# Patient Record
Sex: Female | Born: 1970
Health system: Southern US, Community
[De-identification: ages and names within clinical notes are randomized; demographics above are authoritative.]

## PROBLEM LIST (undated history)

## (undated) VITALS — BP 125/81 | HR 88 | Temp 98.7°F | Resp 17

## (undated) VITALS — BP 135/92 | HR 84 | Temp 98.2°F | Resp 18 | Ht 66.0 in | Wt 260.0 lb

## (undated) VITALS — BP 130/84 | HR 109 | Temp 98.3°F | Resp 16 | Ht 66.0 in | Wt 231.0 lb

## (undated) DIAGNOSIS — R7303 Prediabetes: Secondary | ICD-10-CM

## (undated) DIAGNOSIS — J45909 Unspecified asthma, uncomplicated: Secondary | ICD-10-CM

## (undated) DIAGNOSIS — R569 Unspecified convulsions: Secondary | ICD-10-CM

## (undated) DIAGNOSIS — I10 Essential (primary) hypertension: Secondary | ICD-10-CM

## (undated) DIAGNOSIS — E119 Type 2 diabetes mellitus without complications: Secondary | ICD-10-CM

## (undated) DIAGNOSIS — F32A Depression, unspecified: Secondary | ICD-10-CM

## (undated) DIAGNOSIS — F209 Schizophrenia, unspecified: Secondary | ICD-10-CM

## (undated) DIAGNOSIS — F419 Anxiety disorder, unspecified: Secondary | ICD-10-CM

## (undated) DIAGNOSIS — F329 Major depressive disorder, single episode, unspecified: Secondary | ICD-10-CM

## (undated) DIAGNOSIS — G4700 Insomnia, unspecified: Secondary | ICD-10-CM

## (undated) DIAGNOSIS — C50919 Malignant neoplasm of unspecified site of unspecified female breast: Secondary | ICD-10-CM

## (undated) DIAGNOSIS — D649 Anemia, unspecified: Secondary | ICD-10-CM

## (undated) DIAGNOSIS — F319 Bipolar disorder, unspecified: Secondary | ICD-10-CM

## (undated) HISTORY — PX: BREAST SURGERY: SHX581

## (undated) HISTORY — PX: TONSILLECTOMY: SUR1361

## (undated) HISTORY — DX: Prediabetes: R73.03

## (undated) HISTORY — DX: Malignant neoplasm of unspecified site of unspecified female breast: C50.919

---

## 1998-03-02 ENCOUNTER — Emergency Department (HOSPITAL_COMMUNITY): Admission: EM | Admit: 1998-03-02 | Discharge: 1998-03-02 | Payer: Self-pay | Admitting: Emergency Medicine

## 1998-04-10 ENCOUNTER — Emergency Department (HOSPITAL_COMMUNITY): Admission: EM | Admit: 1998-04-10 | Discharge: 1998-04-10 | Payer: Self-pay | Admitting: Emergency Medicine

## 1998-07-11 ENCOUNTER — Inpatient Hospital Stay (HOSPITAL_COMMUNITY): Admission: AD | Admit: 1998-07-11 | Discharge: 1998-07-16 | Payer: Self-pay | Admitting: *Deleted

## 1998-09-29 ENCOUNTER — Inpatient Hospital Stay (HOSPITAL_COMMUNITY): Admission: AD | Admit: 1998-09-29 | Discharge: 1998-09-29 | Payer: Self-pay | Admitting: Obstetrics

## 1998-10-17 ENCOUNTER — Emergency Department (HOSPITAL_COMMUNITY): Admission: EM | Admit: 1998-10-17 | Discharge: 1998-10-17 | Payer: Self-pay | Admitting: Emergency Medicine

## 1998-12-08 ENCOUNTER — Other Ambulatory Visit: Admission: RE | Admit: 1998-12-08 | Discharge: 1998-12-08 | Payer: Self-pay | Admitting: Obstetrics

## 1999-01-30 ENCOUNTER — Inpatient Hospital Stay (HOSPITAL_COMMUNITY): Admission: EM | Admit: 1999-01-30 | Discharge: 1999-02-01 | Payer: Self-pay

## 1999-03-25 ENCOUNTER — Inpatient Hospital Stay (HOSPITAL_COMMUNITY): Admission: AD | Admit: 1999-03-25 | Discharge: 1999-03-25 | Payer: Self-pay | Admitting: Obstetrics

## 1999-04-02 ENCOUNTER — Inpatient Hospital Stay (HOSPITAL_COMMUNITY): Admission: EM | Admit: 1999-04-02 | Discharge: 1999-04-09 | Payer: Self-pay | Admitting: Emergency Medicine

## 1999-04-24 ENCOUNTER — Inpatient Hospital Stay (HOSPITAL_COMMUNITY): Admission: AD | Admit: 1999-04-24 | Discharge: 1999-04-24 | Payer: Self-pay | Admitting: Obstetrics

## 1999-04-25 ENCOUNTER — Encounter (INDEPENDENT_AMBULATORY_CARE_PROVIDER_SITE_OTHER): Payer: Self-pay | Admitting: Specialist

## 1999-04-25 ENCOUNTER — Inpatient Hospital Stay (HOSPITAL_COMMUNITY): Admission: AD | Admit: 1999-04-25 | Discharge: 1999-04-27 | Payer: Self-pay | Admitting: Obstetrics

## 2000-02-08 ENCOUNTER — Emergency Department (HOSPITAL_COMMUNITY): Admission: EM | Admit: 2000-02-08 | Discharge: 2000-02-09 | Payer: Self-pay | Admitting: Emergency Medicine

## 2000-08-28 HISTORY — PX: TUBAL LIGATION: SHX77

## 2000-10-12 ENCOUNTER — Inpatient Hospital Stay (HOSPITAL_COMMUNITY): Admission: EM | Admit: 2000-10-12 | Discharge: 2000-10-17 | Payer: Self-pay | Admitting: *Deleted

## 2000-12-24 ENCOUNTER — Other Ambulatory Visit (HOSPITAL_COMMUNITY): Admission: RE | Admit: 2000-12-24 | Discharge: 2000-12-28 | Payer: Self-pay | Admitting: Psychiatry

## 2001-01-20 ENCOUNTER — Inpatient Hospital Stay (HOSPITAL_COMMUNITY): Admission: AD | Admit: 2001-01-20 | Discharge: 2001-01-21 | Payer: Self-pay | Admitting: Obstetrics

## 2001-01-21 ENCOUNTER — Encounter: Payer: Self-pay | Admitting: Obstetrics

## 2001-01-23 ENCOUNTER — Encounter: Admission: RE | Admit: 2001-01-23 | Discharge: 2001-01-23 | Payer: Self-pay | Admitting: Obstetrics & Gynecology

## 2001-05-09 ENCOUNTER — Inpatient Hospital Stay (HOSPITAL_COMMUNITY): Admission: EM | Admit: 2001-05-09 | Discharge: 2001-05-17 | Payer: Self-pay | Admitting: *Deleted

## 2001-05-17 ENCOUNTER — Encounter: Payer: Self-pay | Admitting: Obstetrics

## 2001-05-17 ENCOUNTER — Inpatient Hospital Stay (HOSPITAL_COMMUNITY): Admission: AD | Admit: 2001-05-17 | Discharge: 2001-05-19 | Payer: Self-pay | Admitting: Obstetrics

## 2001-05-17 ENCOUNTER — Encounter (INDEPENDENT_AMBULATORY_CARE_PROVIDER_SITE_OTHER): Payer: Self-pay | Admitting: Specialist

## 2001-05-19 ENCOUNTER — Inpatient Hospital Stay (HOSPITAL_COMMUNITY): Admission: AD | Admit: 2001-05-19 | Discharge: 2001-05-27 | Payer: Self-pay | Admitting: *Deleted

## 2001-09-21 ENCOUNTER — Inpatient Hospital Stay (HOSPITAL_COMMUNITY): Admission: EM | Admit: 2001-09-21 | Discharge: 2001-10-01 | Payer: Self-pay | Admitting: Psychiatry

## 2001-10-02 ENCOUNTER — Other Ambulatory Visit (HOSPITAL_COMMUNITY): Admission: RE | Admit: 2001-10-02 | Discharge: 2001-10-07 | Payer: Self-pay | Admitting: *Deleted

## 2001-10-07 ENCOUNTER — Inpatient Hospital Stay (HOSPITAL_COMMUNITY): Admission: AD | Admit: 2001-10-07 | Discharge: 2001-10-07 | Payer: Self-pay | Admitting: Obstetrics

## 2001-11-18 ENCOUNTER — Inpatient Hospital Stay (HOSPITAL_COMMUNITY): Admission: EM | Admit: 2001-11-18 | Discharge: 2001-12-04 | Payer: Self-pay | Admitting: Psychiatry

## 2001-12-05 ENCOUNTER — Other Ambulatory Visit (HOSPITAL_COMMUNITY): Admission: RE | Admit: 2001-12-05 | Discharge: 2001-12-16 | Payer: Self-pay | Admitting: *Deleted

## 2003-05-14 ENCOUNTER — Inpatient Hospital Stay (HOSPITAL_COMMUNITY): Admission: EM | Admit: 2003-05-14 | Discharge: 2003-05-21 | Payer: Self-pay | Admitting: Psychiatry

## 2004-01-31 ENCOUNTER — Inpatient Hospital Stay (HOSPITAL_COMMUNITY): Admission: EM | Admit: 2004-01-31 | Discharge: 2004-02-06 | Payer: Self-pay | Admitting: Psychiatry

## 2004-06-16 ENCOUNTER — Ambulatory Visit: Payer: Self-pay | Admitting: Psychiatry

## 2004-06-16 ENCOUNTER — Inpatient Hospital Stay (HOSPITAL_COMMUNITY): Admission: RE | Admit: 2004-06-16 | Discharge: 2004-06-21 | Payer: Self-pay | Admitting: Psychiatry

## 2004-11-17 ENCOUNTER — Inpatient Hospital Stay (HOSPITAL_COMMUNITY): Admission: RE | Admit: 2004-11-17 | Discharge: 2004-11-21 | Payer: Self-pay | Admitting: Psychiatry

## 2004-11-17 ENCOUNTER — Ambulatory Visit: Payer: Self-pay | Admitting: Psychiatry

## 2006-02-07 ENCOUNTER — Inpatient Hospital Stay (HOSPITAL_COMMUNITY): Admission: RE | Admit: 2006-02-07 | Discharge: 2006-02-14 | Payer: Self-pay | Admitting: *Deleted

## 2006-02-08 ENCOUNTER — Ambulatory Visit: Payer: Self-pay | Admitting: *Deleted

## 2008-10-11 ENCOUNTER — Other Ambulatory Visit: Payer: Self-pay | Admitting: Emergency Medicine

## 2008-10-11 ENCOUNTER — Inpatient Hospital Stay (HOSPITAL_COMMUNITY): Admission: EM | Admit: 2008-10-11 | Discharge: 2008-10-16 | Payer: Self-pay | Admitting: Psychiatry

## 2008-10-11 ENCOUNTER — Ambulatory Visit: Payer: Self-pay | Admitting: Psychiatry

## 2009-02-06 ENCOUNTER — Other Ambulatory Visit: Payer: Self-pay | Admitting: Emergency Medicine

## 2009-02-07 ENCOUNTER — Ambulatory Visit: Payer: Self-pay | Admitting: *Deleted

## 2009-02-07 ENCOUNTER — Inpatient Hospital Stay (HOSPITAL_COMMUNITY): Admission: AD | Admit: 2009-02-07 | Discharge: 2009-02-12 | Payer: Self-pay | Admitting: *Deleted

## 2009-03-28 ENCOUNTER — Inpatient Hospital Stay (HOSPITAL_COMMUNITY): Admission: AD | Admit: 2009-03-28 | Discharge: 2009-04-01 | Payer: Self-pay | Admitting: Psychiatry

## 2009-03-28 ENCOUNTER — Ambulatory Visit: Payer: Self-pay | Admitting: Psychiatry

## 2009-05-08 ENCOUNTER — Inpatient Hospital Stay (HOSPITAL_COMMUNITY): Admission: AD | Admit: 2009-05-08 | Discharge: 2009-05-19 | Payer: Self-pay | Admitting: Psychiatry

## 2009-05-08 ENCOUNTER — Ambulatory Visit: Payer: Self-pay | Admitting: Psychiatry

## 2009-07-27 ENCOUNTER — Emergency Department (HOSPITAL_COMMUNITY): Admission: EM | Admit: 2009-07-27 | Discharge: 2009-07-27 | Payer: Self-pay | Admitting: Emergency Medicine

## 2010-09-03 ENCOUNTER — Emergency Department (HOSPITAL_COMMUNITY)
Admission: EM | Admit: 2010-09-03 | Discharge: 2010-09-03 | Disposition: A | Discharge status: Other Acute Inpatient Hospital | Payer: Self-pay | Source: Home / Self Care | Admitting: Emergency Medicine

## 2010-09-03 ENCOUNTER — Inpatient Hospital Stay (HOSPITAL_COMMUNITY)
Admission: RE | Admit: 2010-09-03 | Discharge: 2010-09-09 | Payer: Self-pay | Source: Home / Self Care | Attending: Psychiatry | Admitting: Psychiatry

## 2010-09-12 LAB — URINE MICROSCOPIC-ADD ON

## 2010-09-12 LAB — RAPID URINE DRUG SCREEN, HOSP PERFORMED
Amphetamines: NOT DETECTED
Barbiturates: NOT DETECTED
Benzodiazepines: NOT DETECTED
Cocaine: NOT DETECTED
Opiates: NOT DETECTED
Tetrahydrocannabinol: NOT DETECTED

## 2010-09-12 LAB — COMPREHENSIVE METABOLIC PANEL
ALT: 18 U/L (ref 0–35)
AST: 21 U/L (ref 0–37)
Albumin: 4 g/dL (ref 3.5–5.2)
Alkaline Phosphatase: 50 U/L (ref 39–117)
BUN: 8 mg/dL (ref 6–23)
CO2: 24 mEq/L (ref 19–32)
Calcium: 9.8 mg/dL (ref 8.4–10.5)
Chloride: 107 mEq/L (ref 96–112)
Creatinine, Ser: 0.86 mg/dL (ref 0.4–1.2)
GFR calc Af Amer: >60 mL/min (ref >60)
GFR calc non Af Amer: >60 mL/min (ref >60)
Glucose, Bld: 87 mg/dL (ref 70–99)
Potassium: 3.3 mEq/L — ABNORMAL LOW (ref 3.5–5.1)
Sodium: 141 mEq/L (ref 135–145)
Total Bilirubin: 0.7 mg/dL (ref 0.3–1.2)
Total Protein: 7.3 g/dL (ref 6.0–8.3)

## 2010-09-12 LAB — DIFFERENTIAL
Basophils Absolute: 0 10*3/uL (ref 0.0–0.1)
Basophils Relative: 1 % (ref 0–1)
Eosinophils Absolute: 0.5 10*3/uL (ref 0.0–0.7)
Eosinophils Relative: 7 % — ABNORMAL HIGH (ref 0–5)
Lymphocytes Relative: 33 % (ref 12–46)
Lymphs Abs: 2.2 10*3/uL (ref 0.7–4.0)
Monocytes Absolute: 0.5 10*3/uL (ref 0.1–1.0)
Monocytes Relative: 7 % (ref 3–12)
Neutro Abs: 3.5 10*3/uL (ref 1.7–7.7)
Neutrophils Relative %: 52 % (ref 43–77)

## 2010-09-12 LAB — URINALYSIS, ROUTINE W REFLEX MICROSCOPIC
Hgb urine dipstick: NEGATIVE
Ketones, ur: >80 mg/dL — AB
Nitrite: NEGATIVE
Protein, ur: NEGATIVE mg/dL
Specific Gravity, Urine: 1.031 — ABNORMAL HIGH (ref 1.005–1.030)
Urine Glucose, Fasting: NEGATIVE mg/dL
Urobilinogen, UA: 1 mg/dL (ref 0.0–1.0)
pH: 6 (ref 5.0–8.0)

## 2010-09-12 LAB — CBC
HCT: 37.9 % (ref 36.0–46.0)
Hemoglobin: 12.7 g/dL (ref 12.0–15.0)
MCH: 30.7 pg (ref 26.0–34.0)
MCHC: 33.5 g/dL (ref 30.0–36.0)
MCV: 91.5 fL (ref 78.0–100.0)
Platelets: 225 10*3/uL (ref 150–400)
RBC: 4.14 MIL/uL (ref 3.87–5.11)
RDW: 13.1 % (ref 11.5–15.5)
WBC: 6.6 10*3/uL (ref 4.0–10.5)

## 2010-09-12 LAB — PREGNANCY, URINE: Preg Test, Ur: NEGATIVE

## 2010-09-12 LAB — ETHANOL: Alcohol, Ethyl (B): <5 mg/dL (ref 0–10)

## 2010-09-12 LAB — VALPROIC ACID LEVEL: Valproic Acid Lvl: <10 ug/mL — ABNORMAL LOW (ref 50.0–100.0)

## 2010-09-27 ENCOUNTER — Inpatient Hospital Stay (HOSPITAL_COMMUNITY)
Admission: AD | Admit: 2010-09-27 | Discharge: 2010-10-03 | DRG: 885 | Disposition: A | Discharge status: Home / Self Care | Payer: Medicare Other | Attending: Psychiatry | Admitting: Psychiatry

## 2010-09-27 DIAGNOSIS — F259 Schizoaffective disorder, unspecified: Principal | ICD-10-CM

## 2010-09-27 DIAGNOSIS — Z598 Other problems related to housing and economic circumstances: Secondary | ICD-10-CM

## 2010-09-27 LAB — URINALYSIS, ROUTINE W REFLEX MICROSCOPIC
Bilirubin Urine: NEGATIVE
Hgb urine dipstick: NEGATIVE
Ketones, ur: NEGATIVE mg/dL
Nitrite: NEGATIVE
Protein, ur: NEGATIVE mg/dL
Specific Gravity, Urine: 1.02 (ref 1.005–1.030)
Urine Glucose, Fasting: NEGATIVE mg/dL
Urobilinogen, UA: 1 mg/dL (ref 0.0–1.0)
pH: 7.5 (ref 5.0–8.0)

## 2010-09-28 DIAGNOSIS — F259 Schizoaffective disorder, unspecified: Secondary | ICD-10-CM

## 2010-09-28 LAB — HCG, QUANTITATIVE, PREGNANCY: hCG, Beta Chain, Quant, S: <2 m[IU]/mL (ref <5)

## 2010-09-29 LAB — HEMOGLOBIN A1C
Hgb A1c MFr Bld: 5.8 % — ABNORMAL HIGH (ref <5.7)
Mean Plasma Glucose: 120 mg/dL — ABNORMAL HIGH (ref <117)

## 2010-09-29 LAB — VALPROIC ACID LEVEL: Valproic Acid Lvl: 34 ug/mL — ABNORMAL LOW (ref 50.0–100.0)

## 2010-10-02 LAB — VALPROIC ACID LEVEL: Valproic Acid Lvl: 73.6 ug/mL (ref 50.0–100.0)

## 2010-10-04 NOTE — H&P (Signed)
NAMELOIS, SLAGEL                ACCOUNT NO.:  [id number]  MEDICAL RECORD NO.:  [id number]          PATIENT TYPE:  IPS  LOCATION:  0403                          FACILITY:  [facility]  PHYSICIAN:  Eulogio Ditch, MD DATE OF BIRTH:  Jan 16, 1971  DATE OF ADMISSION:  09/27/2010 DATE OF DISCHARGE:                      PSYCHIATRIC ADMISSION ASSESSMENT   IDENTIFICATION:  A 40 year old female, single.  This is a voluntary admission.  HISTORY OF PRESENT ILLNESS:  This is one of several [facility] admissions for Tandrea who arrived with her bags packed here at the [facility] today.  York Spaniel that she took the bus over here because she has been arguing with her mother with whom she has been staying.  She is unable to say why they were arguing, but said that her mother has gotten really irritable with her and has been yelling at her.  She does not feel that it is safe to return there, and she is getting very frustrated with the situation.  It is not clear if she has been compliant with her medications.  She reports that most recently she was hospitalized at [facility] in [place], [name], and was discharged on January 27, because she had been walking out doors without her clothes on.  She denies any homicidal or suicidal thoughts today, but feels she cannot return to her home.  PAST PSYCHIATRIC HISTORY:  Kareema has a history of schizoaffective disorder with several recent admissions, most recently here January 7- 13, 2012, and apparently at [facility] 2-3 days and discharged January 27.  She has a history of being followed recently by an ACT Team for outpatient treatment.  Recently, she was started on Risperdal Consta with her first injection on September 05, 2010.  She has a history of episodes of getting anxious, somewhat agitated and rather disorganized. No known history of suicide attempts.  SOCIAL HISTORY:  This is a single 40 year old female who has been separated for  many years now from her husband of 7 years.  She has 4 or 5 children that are currently in the care of her mother.  She completed high school and 2-3 years of college before developing mental illness. No history of legal problems or charges.  No history of substance abuse.  FAMILY HISTORY:  Not available.  MEDICAL HISTORY:  Primary care physician is not known.  CURRENT MEDICAL PROBLEMS:  None.  She denies.  CURRENT MEDICATIONS:  Medications were validated with her [place] records which have arrived. 1. Risperdal Consta injection on September 19, 2010, of 25 mg, and     previously on January 9 for 50 mg IM here at [facility]. 2. She has taken Depo-Provera injections in the past for     contraception, but current contraception is unclear. 3. Cogentin 1 mg q.h.s. 4. Depakote ER 1000 mg p.o. q.h.s. 5. Trazodone 100 mg q.h.s. 6. Fish oil 1 gram one cap b.i.d. 7. Risperidone 2 mg t.i.d. p.o.  REVIEW OF SYSTEMS:  GENERAL:  This is a well-nourished, well-developed, African American female in no acute physical distress, pleasant and cooperative, a little bit disheveled today.  She is unable to  participate meaningfully in her review of systems, but gives limited information.  CONSTITUTION:  Her sleep has been fine until the past couple days when she was been worrying about the arguments with her mother.  She denies any dizziness or weight loss.  She has been eating regularly. RESPIRATORY:  No shortness of breath.  No chest pain.  She is a nonsmoker.  CARDIAC:  No palpitations.  No syncope.  Denies chest pain. GENITOURINARY:  Denies recent sexual activity.  Denies dysuria or vaginal discharge. GI:  Bowels are regular without medications.  No changes in bowel function or character.  NEURO:  Denies any muscle stiffness or extrapyramidal side effects.  No muscle tension or rigidity.  Denies motor restlessness, dizziness or changes in vision.  ENDOCRINE:  Denies any intolerance to heat or  cold.  PHYSICAL EXAMINATION:  VITAL SIGNS:  Admitting vital signs temperature 97.2, pulse 76, respirations 17, blood pressure 118/80.  She is 93 kg 5 feet 5 inches tall. HEENT:  Normocephalic.  Hair and scalp in good condition. NECK:  Full, active mobility.  Neck is supple.  No thyromegaly.  No lymphadenopathy. CHEST:  Chest is clear to auscultation. CARDIAC:  S1-S2 is heard, regular pulse.  No extra sounds. ABDOMEN:  Soft, nontender, rounded. GENITALIA:  Deferred. BREAST:  Deferred. MUSCULOSKELETAL:  No muscular rigidity noted.  Strength is 5/5 throughout.  Gait is normal with normal arm swing. NEURO:  Cranial nerves II-XII are intact.  Balance intact.  Grip strength equal.  Some difficulty following directions for the cranial nerve exam.  Romberg without findings.  Ames score is zero.  Neuro is nonfocal.  MENTAL STATUS EXAM:  Admitting mental status exam of fully alert, cooperative female with good eye contact, slightly disheveled, cooperative, willing to tell her story.  Somewhat limited insight, but understands that she has not been getting along with her mother and feels hopeless about being able to resolve their conflict, although she is unable to be specific about why they were arguing.  Mood is neutral. At this time, no suicidal or homicidal thoughts.  Has been cooperative here with staff.  Thinking is relatively well organized.  Cognitively, she is intact, oriented x3.  Insight limited.  DISCHARGE DIAGNOSES:  AXIS I:  Schizoaffective disorder. AXIS II:  Deferred. AXIS III:  No diagnosis. AXIS IV:  Severe issues with independent living. AXIS V:  Current 40, past year not known.  PLAN:  Plan is to voluntarily admit her with a goal of evaluating her mental status, and we will contact her mother and attempt to hear additional details about what is going on.  We are going to continue her Depakote and Risperdal Consta.  At this point, we will check a valproate level and a  urine pregnancy test.     Young Berry. Lorin Picket, N.P.   ______________________________ Eulogio Ditch, MD    MAS/MEDQ  D:  09/28/2010  T:  09/28/2010  Job:  [id number]  Electronically Signed by Kari Baars N.P. on 09/29/2010 08:54:57 AM Electronically Signed by Eulogio Ditch  on 10/04/2010 10:05:06 AM

## 2010-10-07 NOTE — Discharge Summary (Signed)
  Yvette, [name]                ACCOUNT NO.:  [id number]  MEDICAL RECORD NO.:  [id number]           PATIENT TYPE:  I  LOCATION:  0403                          FACILITY:  [facility]  PHYSICIAN:  Anselm Jungling, MD  DATE OF BIRTH:  Jan 08, 1971  DATE OF ADMISSION:  09/27/2010 DATE OF DISCHARGE:  10/03/2010                              DISCHARGE SUMMARY   IDENTIFYING DATA/REASON FOR ADMISSION:  This was an inpatient psychiatric admission for [name], an [place] American female with a history of schizoaffective disorder.  Please refer to the admission note for further details pertaining to the symptoms, circumstances and history that led to her hospitalization.  She was given an initial Axis I diagnosis of schizoaffective disorder, NOS.  MEDICAL AND LABORATORY:  The patient was medically and physically assessed by the psychiatric nurse practitioner.  She was in good health without any active or chronic medical problems.  There were nosignificant medical issues during her stay.  HOSPITAL COURSE:  The patient was admitted to the adult inpatient psychiatric service.  She presented as a well-nourished, normally- developed adult female, who had prominent symptoms of thought disorder, with blunted affect.  She had been having auditory hallucinations, and delusional ideation, within a context of thoughts and impulses to harm others.  She was stabilized with a regimen of risperidone, and Depakote, with Cogentin for side effects.  She participated in therapeutic groups and activities geared towards helping her acquire better coping skills, a better understanding of her underlying disorders and dynamics, and the development of an aftercare plan.  She was a fairly good participant throughout.  She worked closely with case management towards an aftercare plan that included appropriate housing that would be supportive to her needs.  The patient appeared appropriate for discharge on the seventh  hospital day.  She agreed to the following aftercare plan.  AFTERCARE:  The patient was to be discharged, with a plan to live at the [facility].  DISCHARGE MEDICATIONS: 1. Risperdal 1 mg q.a.m. and 2 mg nightly. 2. Risperdal Consta 50 mg IM q.4 weeks, last given on October 03, 2010, the day of discharge. 3. Cogentin 1 mg nightly. 4. Depakote 500 mg b.i.d.  DISCHARGE DIAGNOSES:  Axis I: Schizoaffective disorder, not otherwise specified. Axis II: Deferred. Axis III: No acute or chronic illnesses. Axis IV: Stressors severe. Axis V: Global Assessment of Functioning on discharge 50.     Anselm Jungling, MD     SPB/MEDQ  D:  10/06/2010  T:  10/06/2010  Job:  [id number]  Electronically Signed by Geralyn Flash MD on 10/07/2010 08:53:24 AM

## 2010-11-02 ENCOUNTER — Ambulatory Visit (HOSPITAL_COMMUNITY)
Admission: RE | Admit: 2010-11-02 | Discharge: 2010-11-02 | Disposition: A | Discharge status: Home / Self Care | Payer: Medicare Other | Attending: Psychiatry | Admitting: Psychiatry

## 2010-11-07 ENCOUNTER — Other Ambulatory Visit (HOSPITAL_COMMUNITY): Payer: Medicare Other | Attending: Psychiatry | Admitting: Psychiatry

## 2010-11-07 DIAGNOSIS — F259 Schizoaffective disorder, unspecified: Secondary | ICD-10-CM | POA: Insufficient documentation

## 2010-11-08 ENCOUNTER — Other Ambulatory Visit (HOSPITAL_COMMUNITY): Payer: Medicare Other | Attending: Psychiatry | Admitting: Psychiatry

## 2010-11-08 DIAGNOSIS — F3112 Bipolar disorder, current episode manic without psychotic features, moderate: Secondary | ICD-10-CM | POA: Insufficient documentation

## 2010-11-08 DIAGNOSIS — J45909 Unspecified asthma, uncomplicated: Secondary | ICD-10-CM | POA: Insufficient documentation

## 2010-11-08 DIAGNOSIS — F259 Schizoaffective disorder, unspecified: Secondary | ICD-10-CM

## 2010-11-08 DIAGNOSIS — J309 Allergic rhinitis, unspecified: Secondary | ICD-10-CM | POA: Insufficient documentation

## 2010-11-09 ENCOUNTER — Other Ambulatory Visit (HOSPITAL_COMMUNITY): Payer: Medicare Other | Admitting: Psychiatry

## 2010-11-10 ENCOUNTER — Other Ambulatory Visit (HOSPITAL_COMMUNITY): Payer: Medicare Other | Admitting: Psychiatry

## 2010-11-11 ENCOUNTER — Other Ambulatory Visit (HOSPITAL_COMMUNITY): Payer: Medicare Other | Admitting: Psychiatry

## 2010-11-14 ENCOUNTER — Other Ambulatory Visit (HOSPITAL_COMMUNITY): Payer: Medicare Other | Admitting: Psychiatry

## 2010-11-15 ENCOUNTER — Other Ambulatory Visit (HOSPITAL_COMMUNITY): Payer: Medicare Other | Admitting: Psychiatry

## 2010-11-16 ENCOUNTER — Other Ambulatory Visit (HOSPITAL_COMMUNITY): Payer: Medicare Other | Admitting: Psychiatry

## 2010-11-17 ENCOUNTER — Other Ambulatory Visit (HOSPITAL_COMMUNITY): Payer: Medicare Other | Admitting: Psychiatry

## 2010-11-18 ENCOUNTER — Other Ambulatory Visit (HOSPITAL_COMMUNITY): Payer: Medicare Other | Admitting: Psychiatry

## 2010-11-21 ENCOUNTER — Other Ambulatory Visit (HOSPITAL_COMMUNITY): Payer: Medicare Other | Admitting: Psychiatry

## 2010-11-22 ENCOUNTER — Other Ambulatory Visit (HOSPITAL_COMMUNITY): Payer: Medicare Other | Admitting: Psychiatry

## 2010-11-23 ENCOUNTER — Other Ambulatory Visit (HOSPITAL_COMMUNITY): Payer: Medicare Other | Admitting: Psychiatry

## 2010-11-24 ENCOUNTER — Other Ambulatory Visit (HOSPITAL_COMMUNITY): Payer: Medicare Other | Admitting: Psychiatry

## 2010-11-25 ENCOUNTER — Other Ambulatory Visit (HOSPITAL_COMMUNITY): Payer: Medicare Other | Admitting: Psychiatry

## 2010-11-28 ENCOUNTER — Other Ambulatory Visit (HOSPITAL_COMMUNITY): Payer: Medicare Other | Admitting: Psychiatry

## 2010-12-02 LAB — VALPROIC ACID LEVEL
Valproic Acid Lvl: 96.6 ug/mL (ref 50.0–100.0)
Valproic Acid Lvl: 9 ug/mL — ABNORMAL LOW (ref 50.0–100.0)

## 2010-12-02 LAB — CBC
HCT: 36.3 % (ref 36.0–46.0)
Hemoglobin: 12.4 g/dL (ref 12.0–15.0)
MCHC: 34.2 g/dL (ref 30.0–36.0)
MCV: 92.8 fL (ref 78.0–100.0)
Platelets: 207 10*3/uL (ref 150–400)
RBC: 3.92 MIL/uL (ref 3.87–5.11)
RDW: 13.1 % (ref 11.5–15.5)
WBC: 5.7 10*3/uL (ref 4.0–10.5)

## 2010-12-02 LAB — COMPREHENSIVE METABOLIC PANEL
ALT: 12 U/L (ref 0–35)
AST: 16 U/L (ref 0–37)
Albumin: 4.1 g/dL (ref 3.5–5.2)
Alkaline Phosphatase: 53 U/L (ref 39–117)
BUN: 8 mg/dL (ref 6–23)
CO2: 30 mEq/L (ref 19–32)
Calcium: 9.7 mg/dL (ref 8.4–10.5)
Chloride: 104 mEq/L (ref 96–112)
Creatinine, Ser: 0.89 mg/dL (ref 0.4–1.2)
GFR calc Af Amer: >60 mL/min (ref >60)
GFR calc non Af Amer: >60 mL/min (ref >60)
Glucose, Bld: 72 mg/dL (ref 70–99)
Potassium: 3.5 mEq/L (ref 3.5–5.1)
Sodium: 139 mEq/L (ref 135–145)
Total Bilirubin: 0.6 mg/dL (ref 0.3–1.2)
Total Protein: 7.5 g/dL (ref 6.0–8.3)

## 2010-12-03 LAB — COMPREHENSIVE METABOLIC PANEL
ALT: 12 U/L (ref 0–35)
AST: 15 U/L (ref 0–37)
Albumin: 3.9 g/dL (ref 3.5–5.2)
Alkaline Phosphatase: 48 U/L (ref 39–117)
BUN: 12 mg/dL (ref 6–23)
CO2: 27 mEq/L (ref 19–32)
Calcium: 9.5 mg/dL (ref 8.4–10.5)
Chloride: 108 mEq/L (ref 96–112)
Creatinine, Ser: 1.13 mg/dL (ref 0.4–1.2)
GFR calc Af Amer: >60 mL/min (ref >60)
GFR calc non Af Amer: 54 mL/min — ABNORMAL LOW (ref >60)
Glucose, Bld: 96 mg/dL (ref 70–99)
Potassium: 3.6 mEq/L (ref 3.5–5.1)
Sodium: 144 mEq/L (ref 135–145)
Total Bilirubin: 0.4 mg/dL (ref 0.3–1.2)
Total Protein: 7.2 g/dL (ref 6.0–8.3)

## 2010-12-03 LAB — TSH: TSH: 0.915 u[IU]/mL (ref 0.350–4.500)

## 2010-12-03 LAB — CBC
HCT: 38.4 % (ref 36.0–46.0)
Hemoglobin: 13 g/dL (ref 12.0–15.0)
MCHC: 33.9 g/dL (ref 30.0–36.0)
MCV: 92.6 fL (ref 78.0–100.0)
Platelets: 199 10*3/uL (ref 150–400)
RBC: 4.15 MIL/uL (ref 3.87–5.11)
RDW: 14 % (ref 11.5–15.5)
WBC: 5.8 10*3/uL (ref 4.0–10.5)

## 2010-12-03 LAB — VALPROIC ACID LEVEL: Valproic Acid Lvl: <10 ug/mL — ABNORMAL LOW (ref 50.0–100.0)

## 2010-12-05 LAB — COMPREHENSIVE METABOLIC PANEL
ALT: 17 U/L (ref 0–35)
AST: 17 U/L (ref 0–37)
Albumin: 3.8 g/dL (ref 3.5–5.2)
Alkaline Phosphatase: 47 U/L (ref 39–117)
BUN: 7 mg/dL (ref 6–23)
CO2: 28 mEq/L (ref 19–32)
Calcium: 9.3 mg/dL (ref 8.4–10.5)
Chloride: 104 mEq/L (ref 96–112)
Creatinine, Ser: 0.7 mg/dL (ref 0.4–1.2)
GFR calc Af Amer: >60 mL/min (ref >60)
GFR calc non Af Amer: >60 mL/min (ref >60)
Glucose, Bld: 90 mg/dL (ref 70–99)
Potassium: 4 mEq/L (ref 3.5–5.1)
Sodium: 140 mEq/L (ref 135–145)
Total Bilirubin: 0.5 mg/dL (ref 0.3–1.2)
Total Protein: 7.1 g/dL (ref 6.0–8.3)

## 2010-12-05 LAB — CBC
HCT: 35 % — ABNORMAL LOW (ref 36.0–46.0)
Hemoglobin: 11.6 g/dL — ABNORMAL LOW (ref 12.0–15.0)
MCHC: 33.3 g/dL (ref 30.0–36.0)
MCV: 93.1 fL (ref 78.0–100.0)
Platelets: 192 10*3/uL (ref 150–400)
RBC: 3.76 MIL/uL — ABNORMAL LOW (ref 3.87–5.11)
RDW: 13.2 % (ref 11.5–15.5)
WBC: 5.1 10*3/uL (ref 4.0–10.5)

## 2010-12-05 LAB — DIFFERENTIAL
Basophils Absolute: 0.1 10*3/uL (ref 0.0–0.1)
Basophils Relative: 1 % (ref 0–1)
Eosinophils Absolute: 0.3 10*3/uL (ref 0.0–0.7)
Eosinophils Relative: 5 % (ref 0–5)
Lymphocytes Relative: 35 % (ref 12–46)
Lymphs Abs: 1.8 10*3/uL (ref 0.7–4.0)
Monocytes Absolute: 0.3 10*3/uL (ref 0.1–1.0)
Monocytes Relative: 6 % (ref 3–12)
Neutro Abs: 2.6 10*3/uL (ref 1.7–7.7)
Neutrophils Relative %: 52 % (ref 43–77)

## 2010-12-05 LAB — PREGNANCY, URINE: Preg Test, Ur: NEGATIVE

## 2010-12-05 LAB — URINALYSIS, ROUTINE W REFLEX MICROSCOPIC
Bilirubin Urine: NEGATIVE
Glucose, UA: NEGATIVE mg/dL
Hgb urine dipstick: NEGATIVE
Ketones, ur: NEGATIVE mg/dL
Nitrite: NEGATIVE
Protein, ur: NEGATIVE mg/dL
Specific Gravity, Urine: 1.014 (ref 1.005–1.030)
Urobilinogen, UA: 1 mg/dL (ref 0.0–1.0)
pH: 7.5 (ref 5.0–8.0)

## 2010-12-05 LAB — ETHANOL: Alcohol, Ethyl (B): <5 mg/dL (ref 0–10)

## 2010-12-05 LAB — RAPID URINE DRUG SCREEN, HOSP PERFORMED
Amphetamines: NOT DETECTED
Barbiturates: NOT DETECTED
Benzodiazepines: NOT DETECTED
Cocaine: NOT DETECTED
Opiates: NOT DETECTED
Tetrahydrocannabinol: NOT DETECTED

## 2010-12-05 LAB — VALPROIC ACID LEVEL: Valproic Acid Lvl: 28.8 ug/mL — ABNORMAL LOW (ref 50.0–100.0)

## 2010-12-13 LAB — ETHANOL: Alcohol, Ethyl (B): <5 mg/dL (ref 0–10)

## 2010-12-13 LAB — POCT I-STAT, CHEM 8
BUN: 11 mg/dL (ref 6–23)
Calcium, Ion: 1.14 mmol/L (ref 1.12–1.32)
Chloride: 106 mEq/L (ref 96–112)
Creatinine, Ser: 0.8 mg/dL (ref 0.4–1.2)
Glucose, Bld: 85 mg/dL (ref 70–99)
HCT: 43 % (ref 36.0–46.0)
Hemoglobin: 14.6 g/dL (ref 12.0–15.0)
Potassium: 3.6 mEq/L (ref 3.5–5.1)
Sodium: 141 mEq/L (ref 135–145)
TCO2: 23 mmol/L (ref 0–100)

## 2010-12-13 LAB — SALICYLATE LEVEL: Salicylate Lvl: <4 mg/dL (ref 2.8–20.0)

## 2010-12-13 LAB — CBC
HCT: 40 % (ref 36.0–46.0)
Hemoglobin: 13.6 g/dL (ref 12.0–15.0)
MCHC: 34.1 g/dL (ref 30.0–36.0)
MCV: 88.2 fL (ref 78.0–100.0)
Platelets: 246 10*3/uL (ref 150–400)
RBC: 4.54 MIL/uL (ref 3.87–5.11)
RDW: 12.8 % (ref 11.5–15.5)
WBC: 7.6 10*3/uL (ref 4.0–10.5)

## 2010-12-13 LAB — RAPID URINE DRUG SCREEN, HOSP PERFORMED
Amphetamines: NOT DETECTED
Barbiturates: NOT DETECTED
Benzodiazepines: NOT DETECTED
Cocaine: NOT DETECTED
Opiates: NOT DETECTED
Tetrahydrocannabinol: NOT DETECTED

## 2010-12-13 LAB — DIFFERENTIAL
Basophils Absolute: 0.1 10*3/uL (ref 0.0–0.1)
Basophils Relative: 1 % (ref 0–1)
Eosinophils Absolute: 0.1 10*3/uL (ref 0.0–0.7)
Eosinophils Relative: 1 % (ref 0–5)
Lymphocytes Relative: 18 % (ref 12–46)
Lymphs Abs: 1.4 10*3/uL (ref 0.7–4.0)
Monocytes Absolute: 0.5 10*3/uL (ref 0.1–1.0)
Monocytes Relative: 6 % (ref 3–12)
Neutro Abs: 5.5 10*3/uL (ref 1.7–7.7)
Neutrophils Relative %: 74 % (ref 43–77)

## 2010-12-13 LAB — ACETAMINOPHEN LEVEL: Acetaminophen (Tylenol), Serum: <10 ug/mL — ABNORMAL LOW (ref 10–30)
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
## 2011-01-13 NOTE — Discharge Summary (Signed)
NAMESHAMARIE, CALL NO.:  [id number]   MEDICAL RECORD NO.:  [id number]          PATIENT TYPE:  IPS   LOCATION:  0404                          FACILITY:  BH   PHYSICIAN:  Jasmine Pang, M.D. DATE OF BIRTH:  1971/03/15   DATE OF ADMISSION:  02/07/2006  DATE OF DISCHARGE:  02/14/2006                                 DISCHARGE SUMMARY   IDENTIFICATION:  A 40 year old African-American female who was admitted on a  voluntary basis to my service on February 07, 2006.   HISTORY OF PRESENT ILLNESS:  The patient has a history of increased stress.  She has been having suicidal thoughts with paranoia.  She feels people  talking about her. She reports some conflict with her mother.  There are no  specific triggers. She states she has been sleeping well with her  medication.  She has had an increased weight gain over the past few years of  approximately 80 pounds.  She occasionally is experiencing auditory  hallucinations.  She was last here in March 2006. She sees [facility] Ilsa [facility]ha at the  [facility]. The patient is currently on Zyprexa 30  mg p.o. at bedtime. She has no known drug allergies.  She denies alcohol or  drug use.  She is a nonsmoker.  She denies any family history.   PHYSICAL EXAMINATION:  The patient presented as an obese African-American  female who had no acute medical problems.   ADMISSION LABORATORIES:  CBC was grossly within normal limits.  Basic  metabolic panel was within normal limits. AST was 15, ALT was 14.   HOSPITAL COURSE:  The patient was continued on Zyprexa 20 mg at bedtime  (instead of the 30 mg at bedtime). She tolerated this medication well, with  no significant side effects.  She did states she likes the Zydis better, as  it was not as dry in her mouth.  Upon admission, the patient was lying in  bed when I first met her. She was very suspicious and guarded.  She did not  want to converse for a long period of time.   There was psychomotor  retardation and poor eye contact.  She admitted to an escalation of her  psychosis.  On February 09, 2006, the patient was less guarded and more  talkative and responsive to conversation.  Eye contact had improved.  She  was still lying in bed with psychomotor retardation.  Mood depressed.  Affect constricted.  No suicidal or homicidal ideation.  She discussed her  reason for admission and reports she has been here before.   On February 10, 2006, the patient discussed having been under a lot of stress at  home.  She has conflict with her family at times. She would rather not go  home, but it is available for me.  She feels her life is determined by her  family's whim.  She has her children on the weekends. She is conversant,  with good eye contact. She was asking about when she could go home and happy  to hear it would  be within the next 2-3 days.  I indicated I wanted to see  her out of bed more often. On February 12, 2006, mental status had improved  markedly.  She was hoping for discharge soon. She was still in bed as we  talked, but has been noted by staff to be out more frequently. Her mood and  affect had improved.   On February 14, 2006, the patient's mental status had improved from admit  status.  She was out of bed in the day room, with good eye contact.  There  was less psychomotor retardation.  Mood was less depressed or anxious.  Affect wider range.  No suicidal or homicidal ideation.  No auditory or  visual hallucinations.  No delusions or paranoia.  Thoughts were logical and  goal directed.  She wanted to return home to live with her mother at this  point.  The cognitive was grossly within normal limits.  The patient's will  follow up at the [facility] with Dr. Lang Snow.   DISCHARGE DIAGNOSES:   AXIS I:  Schizoaffective disorder, depressed type.   AXIS II:  None.   AXIS III:  Anemia.  Obesity.   AXIS IV:  Severe (problems with primary support group,  housing problems,  economic problems).   AXIS V:  Global assessment of function upon discharge was 43, global  assessment of function upon admission was 30, global assessment of function  highest past year is 55.   DISCHARGE PLANS:  The patient had no specific activity level or dietary  restrictions.   DISCHARGE MEDICATIONS:  Zydis 20 mg p.o. at bedtime.   POST-HOSPITAL CARE PLANS:  The patient will see Dr. Lang Snow at the [facility] on Tuesday February 20, 2006 at 1:00 p.m.      Jasmine Pang, M.D.  Electronically Signed     BHS/MEDQ  D:  02/14/2006  T:  02/15/2006  Job:  [id number]

## 2011-01-13 NOTE — H&P (Signed)
[facility]  Patient:    [name], [name] Visit Number: [id number] MRN: [id number]          Service Type: PSY Location: 400 [id number] Attending Physician:  Jeanice Lim Dictated by:   Candi Leash. Orsini, N.P. Admit Date:  11/18/2001                     Psychiatric Admission Assessment  DATE OF ADMISSION: November 18, 2001  IDENTIFYING INFORMATION: This is a 40 year old, African American female, involuntary admitted for suicidal behavior on November 18, 2001.  HISTORY OF PRESENT ILLNESS: The patient presents on commitment papers because of patient had gone to a day care center, was screaming at the staff, delusional about breast feeding. The patient has no children, tangential, loosening of associations. The patient has no recall about yesterday. The patient reports she has been sleeping fair, appetite is decreased. Reports weight loss with undeterminable amount of weight loss. Denies any depressive symptoms, admitting to some anxiety but no suicidal or homicidal thoughts. Denies any psychotic symptoms at present.  PAST PSYCHIATRIC HISTORY: Last hospitalization was in January of 2003. She sees Dr. Elna Breslow as an outpatient, has had several other inpatient admissions.  SOCIAL HISTORY: She is a 40 year old, married, African American female, married for five years. Reports a child that was delivered in December of 2002.  FAMILY HISTORY: Unknown.  ALCOHOL/DRUG HISTORY: No alcohol or drug history.  PRIMARY CARE PHYSICIAN: Unknown.  PAST MEDICAL HISTORY: Medical problems are questionable, borderline diabetes mellitus.  MEDICATIONS: 1. Lithium carbonate 300 mg b.i.d. 2. Cogentin 1 mg b.i.d. 3. Risperdal 0.5 mg b.i.d., 3 mg at h.s.  The patient reports noncompliance with medications.  ALLERGIES: SHRIMP.  PHYSICAL EXAMINATION:  VITAL SIGNS: The patients vital signs 99, 70 heart rate, 20 respirations, blood pressure is 130/86. The patient is  133 pounds. She is 5 feet 7 inches tall.  GENERAL APPEARANCE: The patient is a 40 year old, African American female in no acute distress. Well-developed. Appears her stated age. She has some body odor, is unkempt.  HEENT: Normocephalic. She can raise her eyebrows. Her EOMs are intact bilaterally. External ear canals are patent. Hearing is ______ to conversation. No sinus tenderness or nasal discharge. Mucosa is moist with fair dentition. Tongue protrudes midline without tremor and she can puff out her checks.  NECK: Supple. No JVD. Negative lymphadenopathy. Thyroid is nonpalpable, nontender.  CHEST: Clear to auscultation. No adventitious sounds. No cough.  HEART: Rate is regular rate and rhythm without murmurs, gallops, or rubs. Carotid pulses are equal and adequate.  BREASTS: Examination is deferred.  ABDOMEN: Soft, nontender abdomen. No CVA tenderness.  MUSCULOSKELETAL: Good range of motion. Muscle strength and tone is equal bilaterally.  SKIN: Warm and dry with good turgor. Nail beds are pink with good capillary refill. No cyanosis or clubbing.  NEUROLOGICAL: She is oriented x3. Her cranial nerves are grossly intact. Good grip strength bilaterally. No involuntary movement. Skin is normal. Cerebellar function is intact with normal alternating movements.  LABORATORY DATA: WBC count is low at 3.8, RBC is 3.76. Potassium 3.4. Hemoglobin and hematocrit is 11.1 and 32.5, respectively. RDW is 14.7.  MENTAL STATUS EXAMINATION: She is an alert, young, [place] American female, cooperative, casually dressed with some body odor. Speech is tangential, monologue-type conversation. Mood is anxious. Affect is calm and flat. Thought processes are delusional with loosening of associations. No auditory or visual hallucinations. No suicidal or homicidal ideation. Some paranoid ideation present. Cognitive functioning intact.  Memory is poor. Judgment is poor. Insight is poor. The patient is  uncertain why she is here. She endorses early auditory hallucinations, is a poor historian.  ADMISSION DIAGNOSES: Axis I:    Schizoaffective disorder. Axis II:   None. Axis III:  None. Axis IV:   Deferred. Axis V:    Current 25, past is 60.  PLAN: Involuntary commitment for psychosis. Contract for safety. Check every 15 minutes. The patient is placed on the 400 hall for close monitoring. We will resume her routine medications. Will obtain laboratory data and lithium level. Will put patient on multivitamins and ferrous sulfate and monitor her eating and fluid intake. Will followup with [facility] to be medication compliant and return to prior living arrangements.  TENTATIVE LENGTH OF STAY: Four to five days depending on patients response to medication. Dictated by:   Candi Leash. Orsini, N.P. Attending Physician:  Jeanice Lim DD:  11/20/01 TD:  11/21/01 Job: [id number]

## 2011-01-13 NOTE — H&P (Signed)
[name], [name]                            ACCOUNT NO.:  [id number]   MEDICAL RECORD NO.:  [id number]                   PATIENT TYPE:  IPS   LOCATION:  0400                                 FACILITY:  [facility]   PHYSICIAN:  Syed T. Arfeen, M.D.                DATE OF BIRTH:  03-11-1971   DATE OF ADMISSION:  01/31/2004  DATE OF DISCHARGE:                         PSYCHIATRIC ADMISSION ASSESSMENT   PSYCHIATRIC ADMISSION ASSESSMENT   IDENTIFYING INFORMATION:  A 40 year old, married African American female  voluntarily admitted on October 31, 2003.   HISTORY OF PRESENT ILLNESS:  The patient presents with a history of  depression, having felt that she cut herself with a razor, experiencing  positive visual hallucinations, seeing crowns and handles on people.  The  patient feels very depressed and anxious.  She states that she is currently  signed up to go into the [employer] and will be doing work as well for [employer].  She  states overall she feels very tired and having difficulty sleeping.  Her  appetite has been satisfactory.  Patient denies any suicidal or homicidal  ideation and reports no significant stressors.   PAST PSYCHIATRIC HISTORY:  Several admissions to [name]  Sees Dr. Allyne Gee at [facility].  She states that she is a  member of the [facility] team.   SOCIAL HISTORY:  She is a 40 year old married, African American female who  has been married for six years, first marriage.  Patient states that she has  six children, although last admission notes the patient has three children.  Her housing situation is unclear.   FAMILY HISTORY:  Patient denies, although chart indicates that an uncle had  shot himself.   ALCOHOL AND DRUG HISTORY:  Denies smoking.  Denies any alcohol nor drug use.   PRIMARY CARE PHYSICIAN:  Urgent care.   MEDICAL PROBLEMS:  None.   MEDICATIONS:  Patient has been on Zyprexa.   DRUG ALLERGIES:  None.   PHYSICAL EXAMINATION:  Was  performed.  She is a well-nourished, disheveled  female in no acute distress.  Nonfocal neurological findings.  Able to  perform easily heel-to-shin and normal alternating movements.  Temperature  97.0, heart rate 78, respirations 20, blood pressure 115/77, 5 feet 7 inches  tall, 202 pounds.  Hemoglobin 11.5, hematocrit 33.2. CMET is within normal  limits.   MENTAL STATUS EXAM:  Alert, cooperative female.  Good eye contact.  Disheveled.  Speech clear, no pressure.  Patient states she feels healthy  today.  Her affect is constricted.  Thought processes are bizarre with some  questionable delusions with some loose associations.  Patient endorses  positive visual hallucinations, although does not appear to be responding to  internal stimuli at this time.  Conduct function, the patient is aware of  self and situation.  Memory is poor.  Judgment and insight are poor.   DIAGNOSES:  AXIS I:  Schizoaffective disorder.   AXIS II:  Deferred.   AXIS III:  None.   AXIS IV:  Other psychosocial problems related to mental illness,  questionable noncompliance of medication.   AXIS V:  Current is 25, past year 58.   PLAN:  Admission for suicidal ideation and psychosis, contract for safety.  Stabilize mood and ___________.  Patient placed in __________ for close  monitoring.  Will discontinue the Zyprexa and initiate Risperdal to control  psychotic symptoms.  We will contact a support group for discharge and  support.  Patient is to follow with the [facility] to be  medication compliant.   ESTIMATED LENGTH OF STAY:  5-6 days.     [name], N.P.                       Syed T. Lolly Mustache, M.D.    JO/MEDQ  D:  02/02/2004  T:  02/02/2004  Job:  [id number]

## 2011-01-13 NOTE — Discharge Summary (Signed)
[facility]  Patient:    [name], [name] Visit Number: [id number] MRN: [id number]          Service Type: PSY Location: PIOP Attending Physician:  [name] Dictated by:   [name], M.D. Admit Date:  12/05/2001 Discharge Date: 12/16/2001                             Discharge Summary  CHIEF COMPLAINT AND HISTORY OF PRESENT ILLNESS:  This was one of multiple admissions to [facility] for this 40 year old female admitted for suicidal behavior.  She apparently went to a daycare center screaming at the staff, delusional about breastfeeding.  She was tangential, evidencing looseness of associations.  No recall about what happened upon evaluation.  Claimed that she had been sleeping fair, appetite decreased, weight loss, denied any depressive symptoms, did admit to some anxiety but no suicidal ideas.  PAST PSYCHIATRIC HISTORY:  Last time hospitalized January 2003.  Sees [name] on an outpatient basis.  SUBSTANCE ABUSE HISTORY:  No history of alcohol or drug use.  PAST MEDICAL HISTORY:  Noncontributory.  MEDICATIONS ON ADMISSION: 1. Lithium carbonate 300 mg twice a day. 2. Cogentin 1 mg twice a day. 3. Risperdal 0.5 mg twice a day and 3 mg at bedtime.  PHYSICAL EXAMINATION:  GENERAL:  Performed and failed to show any acute findings.  MENTAL STATUS EXAMINATION ON ADMISSION:  Alert, young female, cooperative, casually dressed, some body odor.  Speech was tangential, monologue type conversation.  Mood: Anxious.  Affect: Calm, flat.  Thought processes: Delusional with looseness of associations; no auditory or visual hallucinations, no suicidal or homicidal ideas, some paranoid ideas present. Cognitive: Well preserved.  ADMITTING DIAGNOSES: Axis I:    Schizoaffective disorder. Axis II:   No diagnosis. Axis III:  No diagnosis. Axis IV:   Moderate. Axis V:    Global assessment of functioning upon admission 25,  highest global            assessment of functioning in the last year 60.  HOSPITAL COURSE:  She was admitted and started in intensive individual and group psychotherapy.  We worked toward improving reality testing.  She was placed on Ambien and she was kept on Risperdal and lithium.  She was pretty sedated.  Risperdal was increased to 2 mg at bedtime.  She continued to exhibit the circumstantial, tangential thinking, the delusional ideas, so medications were augmented with Loxitane that was increased to 10 mg twice a day and 25 mg at bedtime.   She tolerated the Loxitane well; we increased to 10 mg twice a day and 50 mg at bedtime.  Still, she was not too clear in what she needed to do to maintain herself from having to be readmitted.  She claimed that she was willing to take the medication.  The circumstantiality decreased as she started tolerating the medication and by April 9, she was much improved, marked decrease in the circumstantial thinking, no suicidal ideas, no homicidal ideas, no side effects to medications, sleeping well, willing to come back to IOP for further stabilization.  It seemed that she had obtained full benefit from this hospital stay.  She was considered for discharge; discharge was granted.  DISCHARGE DIAGNOSES: Axis I:    Schizoaffective disorder, depressed. Axis II:   No diagnosis. Axis III:  No diagnosis. Axis IV:   Moderate. Axis V:    Global assessment of functioning upon  discharge 50.  DISCHARGE MEDICATIONS: 1. Cogentin 1 mg twice a day. 2. Ferrous sulfate 325 mg daily. 3. Risperdal 2 mg at bedtime and 0.5 one twice a day. 4. Lithium carbonate 300 mg in the morning and two at bedtime. 5. Loxitane 10 mg twice a day and 50 mg at bedtime.  FOLLOWUP:  Mental Health IOP with followup at [facility], possible referral to the PACT Team. Dictated by:   Yvette Poll. Dub [name], M.D. Attending Physician:  [name] DD:  01/08/02 TD:   01/10/02 Job: [id number]

## 2011-01-13 NOTE — Discharge Summary (Signed)
NAMEMILLY, GOGGINS NO.:  [id number]   MEDICAL RECORD NO.:  [id number]          PATIENT TYPE:  IPS   LOCATION:  0400                          FACILITY:  [facility]   PHYSICIAN:  Jeanice Lim, M.D. DATE OF BIRTH:  02-Dec-1970   DATE OF ADMISSION:  06/16/2004  DATE OF DISCHARGE:  06/21/2004                                 DISCHARGE SUMMARY   IDENTIFYING DATA:  This is a 40 year old Caucasian female voluntarily  admitted.  One of multiple admissions for this patient with a history of  schizophrenia.  Had missed some medications and became disorganized, having  visual hallucinations, seeing her son on a bus and sees cancer in a  relative.  Anxious with possible suicidal ideation.  May have missed at  least three doses of Zyprexa.  Sees Dr. Allyne Gee.  Possibly followed by PACT  in the past.  One of multiple admissions to [facility].   MEDICATIONS:  Zyprexa versus Risperdal?  Reports taking Zyprexa only.   ALLERGIES:  No known drug allergies.   PHYSICAL EXAMINATION:  Within normal limits.  Neurologically nonfocal.   LABORATORY DATA:  Routine admission labs within normal limits.   MENTAL STATUS EXAM:  Polite, mildly anxious.  No psychomotor abnormalities.  Directable.  Speech no pressure.  Mood somewhat anxious and mildly  depressed.  Affect restricted.  Thought processes concrete and somewhat  disorganized with no overt suicidal or homicidal ideation.  Cognitively  intact.  Unreliable historian.  Judgment and insight were impaired.   ADMISSION DIAGNOSES:   AXIS I:  Schizoaffective disorder.   AXIS II:  None.   AXIS III:  None.   AXIS IV:  Moderate (limited support system and other psychosocial  stressors).   AXIS V:  24/60.   HOSPITAL COURSE:  The patient was admitted and ordered routine p.r.n.  medications and underwent further monitoring.  Was encouraged to participate  in individual, group and milieu therapy.  The patient was placed on safety  checks every 15  minutes and casemanager was to call mother to get further  collateral information and feedback regarding recent symptom course and to  assist in developing a treatment plan, aftercare plan.  Optimized Zyprexa  and restart Risperdal.  The patient initially was quite disorganized with  hallucinations, fearful, suicidal, delusional.  Family meeting was requested  and patient reported a positive response to medication interventions and  other clinical interventions.  The patient reported resolution of psychotic  symptoms.  Denied any dangerous ideation including suicidal or homicidal  ideation.  No hallucinations.  Mood became more stable and affect fuller and  brighter.  The patient showed improved judgment and insight and healthier  coping skills and was given medication education including the metabolic  risk issues including weight gain, diabetes and lipid abnormalities with  Zyprexa.   DISCHARGE MEDICATIONS:  1.  Zyprexa Zydis 10 mg q.a.m. and 20 mg q.h.s.  2.  Cogentin 0.5 mg b.i.d.  3.  Ambien 10 mg q.h.s. p.r.n. insomnia.   FOLLOW UP:  The patient may need transportation.  PACT team was to come pick  up patient at the time of discharge and then she was to follow up at  [facility] on June 22, 2004 at 2 p.m. with Dr.  Allyne Gee.   DISCHARGE DIAGNOSES:   AXIS I:  Schizoaffective disorder.   AXIS II:  None.   AXIS III:  None.   AXIS IV:  Moderate (limited support system and other psychosocial  stressors).   AXIS V:  Global Assessment of Functioning on discharge 55.     Jame   JEM/MEDQ  D:  07/26/2004  T:  07/26/2004  Job:  [id number]

## 2011-01-13 NOTE — Discharge Summary (Signed)
[facility]  Patient:    [name], [name]                       MRN: [id number] Adm. Date:  [id number] Disc. Date: [id number] Attending:  Otilio Saber Dictator:   Young Berry Scott, R.N.N.P.                           Discharge Summary  REASON FOR ADMISSION:  This is a 40 year old African-American female, involuntarily committed by her mother. According to the petition she presented with increasing paranoia, thinking that neighbors were trying harm her and thinking that people were going through her home. The patient also presented to nursing that she probably was feeling homicidal towards love ones. In initial interview, the patient was able to participate only in a very limited basis. She remained in bed with her covers up over her head and participated only with much encouragement. She did state that this was the first time in her life that she had been alone. Her husband was gone and she was caring for her child on her own; that she was terribly afraid. Patients mother thought something was happening to her daughter, but was unable to be specific and was afraid that people were coming in their house and might hurt her.  PAST PSYCHIATRIC HISTORY:  Notable for her being followed at [facility] by Dr. Margrett Rud and Mila Homer and previous hospitalizations at [facility] and at [facility].  SOCIAL HISTORY:  Remarkable for the fact that the patient was separated from her husband and says this is the first time she has cared for her 52-month-old child alone. The patient is disabled with mental health issues and does not work.  FAMILY HISTORY:  Positive for an aunt with schizophrenia. The patient had a past history of cocaine abuse, but none current.  PHYSICAL FINDINGS AT ADMISSION:  The patient was without physical complaints on admission. Her vital signs were within normal limits.  ADMISSION MEDICATIONS: 1. Risperdal unknown  dose. 2. Depakote unknown dose. 3. Paxil unknown dose.  ADMISSION MENTAL STATUS EXAMINATION:  A young black female in bed with covers pulled over head. She did not appear aggressive, was calm and cooperative, but quite withdrawn and removed the covers only with much encouragement. Her affect was hypervigilant, but she generally appeared to be responding to and distracted by internal stimuli with eyes wide and darting about the room. The mood was guarded and vigilant. Thought process was fragmented, unable to focus on questions, however, she did not evidence any suicidal ideation, homicidal ideation. She did evidence auditory and visual hallucinations. She was able to promise that she would be safe on the unit and call for help if she needed any help. Cognitively, she was oriented to person, place and day of the week.  ADMISSION DIAGNOSES: Axis I:                       Schizoaffective disorder. Axis II:                      Deferred. Axis III:                     None. Axis IV:                      Deferred. Axis V:  Current global assessment of functioning 35,                               past year 57.  HOSPITAL COURSE:  We admitted the patient to the stabilization program with q. 15 minute checks. We elected to start the patient on Risperdal 0.5 mg t.i.d. p.o. and then Risperdal 1 mg p.o. q.6h. p.r.n. for any agitation. We also started her on Depakote ER 500 mg p.o. q.h.s. and Paxil 10 mg p.o.q.d.  The patient made some progress, began sleeping and eating well. She was tolerating her medications well. Her affect was somewhat anxious and she still continued to have some paranoid ideation, but was participating more on the floor in the milieu. At that point we elected to increase her Depakote to 1000 mg ER q.h.s.  By October 14, 2000, she reported that she was feeling quite a bit better, was much less paranoid and her affect was quite a bit calmer, although  she was having a bit of sedation from her medications. We elected to check her Depakote level and continue her medications as well, and elicited some feedback from her mother about her situation at home. Her mother after visiting with her reported that she felt she was near her baseline.  The patient continued to show improvement on November 12, 2000, still somewhat bothered by some disturbing thoughts at times, but overall, was very much less paranoid, was sleeping and eating well. The patient was not agitated or hyperactive. Her affect was much more euthymic and did show a little bit of mild sedation. At that point we were still awaiting her laboratory results and began discussing discharge plans, and the case worker began planning for a family session.  The patient revealed that she had gone off her medications several weeks prior to admission. By March 19th her paranoid ideations had resolved and she stated that she did at that point, was able to appreciate the value of staying on her medications and had a better understanding of what had happened to her. She had stopped her medications approximately one month prior because she had run out of the Risperdal and she thought she might want to get pregnant again, but had not planned this. At this point, she was sleeping well, eating well, and looking forward to being discharged, and had agreed to a family session with her husband. The family session as it turned out did not occur, however, because the husband was unable to participate.  On March 20th she was feeling much better. She was having no hallucinations, paranoia or suicidal thoughts. She was sleeping and eating fairly well. Her mood and affect were somewhat mildly depressed, but her behavior was appropriate. It was determined that she would be discharged with [facility], whichever was going to work best for her.  DISCHARGE  MEDICATIONS: 1. She was discharged on Depakote ER 1000 mg q.h.s. 2. Paxil 10 mg one tablet daily. 3. Risperdal 1 mg 1/2 tablet in morning and 1 tablet at bedtime.   DISCHARGE INSTRUCTIONS:  The patient was scheduled for follow-up at [facility] on October 23, 2000 at 1:00 p.m.  DISCHARGE DIAGNOSES: Axis I:                       Schizoaffective disorder. Axis II:  None. Axis III:                     None. Axis IV:                      Mild problems coping with her mental                               illness and her primary support group. Axis V:                       Current global assessment of functioning 50,                               past year 55. DD:  11/15/00 TD:  11/16/00 Job: [id number]
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
## 2011-01-13 NOTE — H&P (Signed)
[facility]  Patient:    [name], [name]                       MRN: [id number] Adm. Date:  [id number] Disc. Date: [id number] Attending:  Otilio Saber Dictator:   Young Berry Lorin Picket, R.N., F.N.P.                   Psychiatric Admission Assessment  DATE OF ADMISSION:  October 12, 2000  PATIENT IDENTIFICATION:  This is a 40 year old African-American female involuntarily committed.  According to the petition, she presented with increased paranoia, thinking that the neighbors were trying to harm her and thinking that people were going through the papers inside her home.  The patients mother fears that she may harm her 98-month-old daughter.  The nursing assessment last night noted the patient was feeling "homicidal toward loved ones."  The patients participation today in this interview is very limited.  She is in bed with the covers over her head.  She was able to tell me that she felt that people were coming in her house and that she was very afraid and she was afraid that something was happening to her daughter but she was unable to be specific regarding exactly what she thought might be happening to her daughter.  She stated she was afraid she would hurt herself. She also stated this was her first experience being on her own caring for her child.  PAST PSYCHIATRIC HISTORY:  (This taken from the patients medical record)  The patient has been followed by Dr. Jacqulyn Ducking and Dr. Gwyndolyn Kaufman at [facility], has a history of previous hospitalizations at [facility] and [facility].  SUBSTANCE ABUSE HISTORY:  The patient has a past history of cocaine abuse but none current.  PAST MEDICAL HISTORY:  For past medical history and primary care [name], the patient looks confused by the question, is not able to report if she has a regular M.D. or not.  The nursing assessment that was done earlier reported that she had a seizure  after her baby was born in 2000 and has a history of Trichomonas and sexually transmitted diseases.  Medications: The patient reports she was on Risperdal, Depakote, and Paxil but doses are unknown.  We will probably check this with mental health.  Drug allergies: None.  Physical examination: Pending.  On admission, her temperature is 97, pulse 86, respirations 20,blood pressure 137/86 sitting and 108/70 standing, weight on admission is 152 pounds.  No acute distress.  Baseline weight is unknown. Admission labs reveal that her TSH was slightly low at 0.282.  ALP slightly low at 38.  Chemistries were otherwise within normal limits.  CBC was within normal limits.  SOCIAL HISTORY:  The patient is currently separated from her husband and says this is the first time that she has cared for her 40-month-old child on her own alone.  The patient is disabled and does not work.  There is some question regarding if the patient has a Armed forces operational officer.  This is noted in the record but the patient was not able to confirm this.  Family history is positive for an aunt with schizophrenia.  MENTAL STATUS EXAMINATION:  Young black female who is in bed with the covers pulled up over her head, does not appear aggressive, is calm.  She removes the covers from around her face only after much encouragement.  Cooperative but very hypervigilant, appears to  be responding to internal stimuli and quite distracted by this with her eyes wide and looking around.  Speech is fragmented with short answers, normal tone, no forced speech.  Mood is guarded and vigilant.  Thought process is fragmented, unable to focus and highly distractible.  The patient is able to contract for safety, no evidence of suicidal ideation, no evidence of homicidal ideation, no evidence of auditory or visual hallucinations.  Oriented to person, place, and does not know the date but she does know the day.  Appears to be cognitively  intact.  ADMISSION DIAGNOSES: Axis I:    Schizoaffective disorder. Axis II:   Deferred. Axis III:  None. Axis IV:   Deferred. Axis V:    Current 35, past 55.  INITIAL PLAN OF CARE:  We will admit this patient to the stabilization program with q.42m. checks.  We will obtain a CMET, CBC, VDRL, UA, TSH, liver panel, and Depakote level on her.  We will contact [facility] to get her medication information with the goal being to stabilize her thinking and be able to discharge her safely in the community with increased coping skills.  ESTIMATED LENGTH OF STAY:  Three to six days. DD:  11/15/00 TD:  11/16/00 Job: [id number]
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
## 2011-01-13 NOTE — H&P (Signed)
[name], [name] NO.:  [id number]   MEDICAL RECORD NO.:  [id number]                   PATIENT TYPE:  IPS   LOCATION:  0407                                 FACILITY:  [facility]   PHYSICIAN:  Jeanice Lim, M.D.              DATE OF BIRTH:  1970/11/04   DATE OF ADMISSION:  05/14/2003  DATE OF DISCHARGE:                         PSYCHIATRIC ADMISSION ASSESSMENT   IDENTIFYING INFORMATION:  This is a 40 year old African-American female who  is married.  This is a voluntary admission.   HISTORY OF PRESENT ILLNESS:  This is the fifth or sixth admission for this  African-American mother of three children, who is followed by the [facility] team  at [facility].  She apparently has been living alone  according to her husband, who we spoke with today and he had not noticed any  changes in her when he visited with her.  She apparently presented herself  here at the hospital, possibly walking here.  Transportation was unclear and  had become worried and concerned and had been pacing a lot yesterday.  Today, she said she is worried because she received a notice from the social  security administration regarding her need to return to work.  She fears  that her benefits are going to be affected.  She is not sure how she is  going to manage without the disability income.  She fears going back to work  and fears that she would not be able to perform adequately to support  herself.  She reports that she has been pacing and walking a lot, being  worried, had some difficulty sleeping and, when asked about suicidal  thoughts, she did endorse this and acknowledge that she had access to a  razor.  She denies having any hallucinations today.   PAST PSYCHIATRIC HISTORY:  The patient sees the [facility] team at [facility].  This is her fifth or sixth admission to [facility] and she has a history of other admissions to [facility].   SOCIAL HISTORY:  This patient, apparently, lives alone according to her  husband, who was contacted today.  Although patient states that she has been  living with family and has 14 children in the home.  However, her husband  reports that indeed she has been living alone.  He visits her about twice a  week.  She has had three children, which are currently being cared for by  other family members and she is able to see them fairly regularly.  The  patient is not employed and is on disability because of her mental illness.   FAMILY HISTORY:  Not available.   ALCOHOL/DRUG HISTORY:  The patient denies any substance abuse.  She has no  history of substance abuse.   MEDICAL HISTORY:  The patient is followed by Dr. Francoise Ceo, who  is  her primary care physician and her OB/GYN physician.  Medical problems are  none.  Past medical history significant for patient having a history of  three prior children by normal vaginal deliveries.  No other available  history related to hospitalizations other than her psychiatric  hospitalizations and no prior surgeries.   MEDICATIONS:  Zyprexa Zydis 10 mg p.o. q.a.m. and 20 mg p.o. q.h.s. and  patient has been taking this at least since March 05, 2003.  The patient is on  no mood stabilizers at this time but has previously been on Depakote which  was discontinued in May of 2004.   ALLERGIES:  HALDOL, which is noted on the mental health medication sheet  which we received and we are not clear about the reaction that this causes.  The patient is also allergic to [facility] and SEAFOOD.   REVIEW OF SYSTEMS:  The patient is concerned about a lesion on her right  ankle.  She denies any other somatic complaints.  Denies any fever or  chills.  Does acknowledge some decreased appetite over the last week while  she was worrying.  Denies any history of chest pain, shortness of breath, no  history of dysuria, abdominal pain.  Bowel movements are  regular, every 1-2  days.   POSITIVE PHYSICAL FINDINGS:  The patient's full physical examination is  noted in the record and was generally unremarkable.  This is a well-  nourished, well-developed, healthy-appearing, African-American female with  normal vital signs.  She weighed 188 pounds and is 5 feet 7 inches tall.  This calculates to a BMI of approximately 29.  This is in contrast to her  weight in March of 2003, which was 133 pounds.  Although she is a bit  disheveled today, generally she is fairly pulled together and her hygiene is  fairly normal in contrast to some previous admissions, where she would be  dressed somewhat bizarrely or wearing her hair in a bizarre fashion.  Today,  she is quite presentable.  Full physical examination is noted in the record  and is generally unremarkable.  We do note that the patient's feet are in  good condition with no signs of lesions, rashes or fungal infection.  She  does have a normal nevus on her right ankle which is not discolored or shows  no variation.  Neuro exam is nonfocal and ocular tracking is within normal  limits.   LABORATORY DATA:  The patient's hemoglobin A1C is 5.6.  Glucose 110.  TSH  0.354.  Total cholesterol 210 mg.  Her CBC is also within normal limits as  was her other metabolic findings.   MENTAL STATUS EXAM:  This is a fully alert female with a neutral affect and  who is receptive, cooperative, pleasant today.  Attention is satisfactory.  Ability to concentrate is satisfactory.  Registration is within normal  limits.  Speech is within normal limits in pace and tone.  No pressure  noted.  Mood is mildly anxious.  Thought process shows some irrelevancy.  She is experiencing some ideas of reference.  Associations are loose.  No  flight of ideas.  However, no overt suicidal or homicidal ideation.  Cognitively, she is intact and oriented x 2.  She is an unreliable historian.  The patient does have positive suicidal thoughts  without clear  plan.   DIAGNOSES:   AXIS I:  Schizoaffective disorder not otherwise specified, acute  exacerbation.   AXIS II:  No diagnosis.  AXIS III:  No diagnosis.   AXIS IV:  Moderate (stress from her concerns regarding her income being  threatened and fears she will be forced back to work).   AXIS V:  Current 38; past year 68.   PLAN:  Voluntarily admit the patient to evaluate her mood and to alleviate  her suicidal thoughts.  We are going to continue current Zyprexa 10 mg Zydis  and 20 mg q.h.s. and ask the casemanager to evaluate this notice that she  has received and see what we can do to alleviate her fears.  We have also  placed her on Ativan 1 mg q.4h. p.r.n. for  agitation but so far she has not required any Ativan.  She may have Ambien  10 mg p.o. q.h.s. p.r.n. and we will monitor her closely.   ESTIMATED LENGTH OF STAY:  Five days.     Margaret A. Stephannie Peters                   Jeanice Lim, M.D.    MAS/MEDQ  D:  05/20/2003  T:  05/21/2003  Job:  [id number]

## 2011-01-13 NOTE — Discharge Summary (Signed)
NAMEKIYOMI, PALLO                            ACCOUNT NO.:  [id number]   MEDICAL RECORD NO.:  [id number]                   PATIENT TYPE:  IPS   LOCATION:  0400                                 FACILITY:  [facility]   PHYSICIAN:  Syed T. Arfeen, M.D.                DATE OF BIRTH:  23-Jan-1971   DATE OF ADMISSION:  01/31/2004  DATE OF DISCHARGE:  02/06/2004                                 DISCHARGE SUMMARY   IDENTIFYING DATA:  The patient is a 40 year old African-American female  voluntarily admitted to [facility].  The patient presented  with a history of depression, having felt that she would cut herself with a  razor, experiencing positive visual hallucinations, believed that someone is  putting parts in her head, body parts are coming out from the body, having  visual hallucinations, seeing crowns and handles on people.  The patient  feels very depressed, anxious.  She said that she is currently signed up to  go into the [employer] and had been doing work as well as for [place].  The patient  also stated that she feels very tired and having difficulty sleeping.  She  denies any homicidal or suicidal ideation but reported no significant  stressors.  She said that she has stopped taking her medication due to she  heard that there is a lawsuit against Zyprexa.   PAST PSYCHIATRIC HISTORY:  Several admissions to [facility],  sees Dr. Allyne Gee at [facility], and she stated that she  is a member of [facility] Team.   CURRENT MEDICATIONS:  On Zyprexa but noncompliant.   ALLERGIES:  None.   PHYSICAL EXAMINATION:  Was performed, essentially within normal limits.  No  neurological findings noted.  Vitals stable.   MENTAL STATUS EXAM:  Alert, cooperative female, good eye contact, somewhat  disheveled.  Speech clear, no pressure, affect constricted, thought  processes bizarre with paranoid ideation and delusions that someone is after  her.  Also endorses positive  visual hallucinations, seeing images, appears  loose at times.  Alert and oriented x3, aware of self and situation.  Memory:  Difficulty in recalling recent and remote memory but overall fair.  Judgment and insight limited.   ADMISSION DIAGNOSES:   AXIS I:  Schizoaffective disorder.   AXIS II:  Deferred.   AXIS III:  None.   AXIS IV:  Psychosocial problems related to mental illness and questionable  compliance with medication.   AXIS V:  25.   HOSPITAL COURSE:  The patient was admitted and started on standing and  p.r.n. medications.  She was started on Risperdal to target the psychosis.  Medication was adjusted according to therapeutic response.  She was also was  encouraged to participate in individual, group  psychotherapy.  She  tolerated the medication very well, appears to be more clear and coherent.  Her paranoia and thought disorder has been  improved.  She seems less  psychotic and more organized, seemed more involved in group therapy.  She  was seen more motivated to pursue her treatment as outpatient and work on a  long term aftercare and follow-up.  She reported no side effects with the  medication.  Her sleep much better, and at the time of discharge she  reported no visual or auditory hallucinations, no suicidal thinking, and no  homicidal thoughts.  The patient was discharge with recommendation to follow  up at [facility] with Dr. Allyne Gee and Aundria Mems.  Follow  up date was June 13 at 10 a.m.   DISCHARGE DIAGNOSES:   AXIS I:  Schizoaffective disorder.   AXIS II:  Deferred.   AXIS III:  None.   AXIS IV:  Psychosocial problems, noncompliant with medication.   AXIS V:  65.   DISCHARGE MEDICATIONS:  1. Risperdal 1 mg p.o. q.a.m. and h.s.  2. Benzatropine for side effects 0.5 mg p.o. q.a.m. and h.s.   DISPOSITION:  Follow up with Dr. Allyne Gee and therapist Aundria Mems on June 13  at 10 a.m. at [facility].                                                Syed T. Lolly Mustache, M.D.    STA/MEDQ  D:  02/12/2004  T:  02/13/2004  Job:  [id number]

## 2011-01-13 NOTE — Discharge Summary (Signed)
NAMEDEBBI, [name] NO.:  [id number]   MEDICAL RECORD NO.:  [id number]                   PATIENT TYPE:  IPS   LOCATION:  0407                                 FACILITY:  [facility]   PHYSICIAN:  Geoffery Lyons, M.D.                   DATE OF BIRTH:  05-08-1971   DATE OF ADMISSION:  05/14/2003  DATE OF DISCHARGE:  05/21/2003                                 DISCHARGE SUMMARY   CHIEF COMPLAINT AND PRESENT ILLNESS:  This is one of multiple admissions to  [facility] for this 40 year old African-American  female.  Followed by [facility], living alone.  The patient took herself to the hospital.  She has been worried, concerned,  has been pacing, difficulty sleeping.  Endorses suicidal thoughts and  acknowledges she has access razor, but denied any hallucinations.  She  claimed that she got a letter from the Social Security disability where they  were accessing her need to return to work.   PAST PSYCHIATRIC HISTORY:  PAC Team at [facility].  Multiple hospitalizations at [facility] and [facility].   ALCOHOL AND DRUG HISTORY:  Denies the use or abuse of any substances.   PAST MEDICAL HISTORY:  3 children by normal vaginal deliveries.   MEDICATIONS:  1. Zyprexa 5 mg in the morning and 20 at night.  2. Mood stabilizers; has been Depakote, discontinued May 2004.   PHYSICAL EXAMINATION:  Performed and failed show any acute findings.   LABORATORY DATA:  Hemoglobin A1c 5.6.  Glucose 110.  TSH 0.354.  Total  cholesterol 210.   MENTAL STATUS EXAM:  Reveals a fully alert female.  Neutral affect,  receptive, cooperative, pleasant.  Attention is satisfactory.  Ready to  concentrate, satisfactory historian.  Is within normal limits.  No pressure  noted.  Mood is anxious.  Some circumstantiality, at times irrelevant.  Some  ideas of reference.  Some loose  associations.  No suicidal or homicidal  ideation is noted.  Is not endorsing any hallucinations.  Cognition well  preserved.   ADMISSION DIAGNOSIS:   AXIS I:  Schizoaffective disorder.   AXIS II:  No diagnosis.   AXIS III:  No diagnosis.   AXIS IV:  Moderate.   AXIS V:  1. Global assessment of functioning upon admission:  38.  2. Highest global assessment of functioning in the last year:  60.   HOSPITAL COURSE:  She was admitted and started in intensive individual and  group psychotherapy.  She was maintained on Zyprexa and she was given some  Ativan as needed for anxiety or agitation.  Overall, the feeling was that of  all the times that she has been inpatient that this time she has done the  best.  She claimed she felt unsafe, felt comfortable with Zyprexa,  and felt  like it was a matter of getting the medication adjusted.  Initially, tired  in bed, but denies any auditory hallucinations.  By May 19, 2003 there  was some pressured speech.  There was evidence of some delusional ideas, but  she was starting to sleep and was hearing no voices.  She was trying to go  back home with husband.   On September 27 she was in full contact with reality.  No evidence of  homicidal or suicidal ideation.  She said that when she came into the unit  she had head rushes; now there were none.  No hallucinations.  She felt  ready to be discharged.  We went ahead and discharged to outpatient  followup.   DISCHARGE DIAGNOSIS:   AXIS I:  Schizoaffective disorder.   AXIS II:  No diagnosis.   AXIS III:  No diagnosis.   AXIS IV:  Moderate.   AXIS V:  Global assessment of functioning on discharge:  50.   DISCHARGE MEDICATIONS:  1. Zyprexa as high at 10 mg in the morning and 20 at night.  2. Ambien 10 at bedtime for sleep.   FOLLOW UP:  [facility].                                               Geoffery Lyons, M.D.    IL/MEDQ  D:  06/17/2003  T:   06/19/2003  Job:  [id number]
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
## 2011-01-13 NOTE — Discharge Summary (Signed)
NAMEELANI, DELPH                ACCOUNT NO.:  [id number]   MEDICAL RECORD NO.:  [id number]          PATIENT TYPE:  IPS   LOCATION:  0407                          FACILITY:  [facility]   PHYSICIAN:  Anselm Jungling, MD  DATE OF BIRTH:  08-Aug-1971   DATE OF ADMISSION:  10/11/2008  DATE OF DISCHARGE:  10/16/2008                               DISCHARGE SUMMARY   IDENTIFYING DATA/REASON FOR ADMISSION:  This was an inpatient  psychiatric admission for [place], a 40 year old African American female.  This was her second [facility] admission, her first one having been in 2006.  She returned to [place] as a patient of [facility], on a  regimen of Zyprexa.  She was admitted due to exacerbation of psychotic  symptoms.  Please refer to the admission note for further details  pertaining to the symptoms, circumstances and history that led to her  hospitalization.  She was given an initial Axis I diagnosis of  schizophrenia, chronic paranoid type, acute exacerbation.   MEDICAL AND LABORATORY:  The patient was medically and physically  assessed by the psychiatric nurse practitioner.  She was in good health  without any active or chronic medical problems.  There were no  significant medical issues.   HOSPITAL COURSE:  The patient was admitted to the adult inpatient  psychiatric service.  She presented as a well-nourished, normally-  developed adult female who was alert and oriented, pleasant and calm.  Her thoughts appeared normally organized and she had no overtly  delusional thinking.  Her mood was neutral and affect was appropriate.  She was absent suicidal ideation and verbalized a strong desire for  help.  She was restarted on Zyprexa initially at 10 mg, later increased  to 20 mg.  The patient gave [place] permission to contact family regarding  overall care and aftercare planning.   The patient appeared appropriate for discharge on the sixth hospital  day.  She agreed to following aftercare  plan.   AFTERCARE:  The patient was to follow-up at [facility] with an appointment to see their psychiatrist on March 4 at 10:30  a.m.   DISCHARGE MEDICATIONS:  Zyprexa 20 mg q.h.s.   DISCHARGE DIAGNOSES:  AXIS I:  Schizophrenia, chronic paranoid type,  acute exacerbation, resolving.  AXIS II:  Deferred.  AXIS III:  No acute or chronic illnesses.  AXIS IV:  Stressors severe.  AXIS V:  GAF on discharge 55.      Anselm Jungling, MD  Electronically Signed     SPB/MEDQ  D:  10/30/2008  T:  10/30/2008  Job:  [id number]

## 2011-01-13 NOTE — H&P (Signed)
[name], [name] NO.:  [id number]   MEDICAL RECORD NO.:  [id number]          PATIENT TYPE:  IPS   LOCATION:  0407                          FACILITY:  BH   PHYSICIAN:  Jeanice Lim, M.D. DATE OF BIRTH:  12/30/1970   DATE OF ADMISSION:  11/17/2004  DATE OF DISCHARGE:                         PSYCHIATRIC ADMISSION ASSESSMENT   IDENTIFYING INFORMATION:  The patient is a 40 year old separated African-  American female who was voluntarily admitted to the [facility]  on November 17, 2004.   HISTORY OF PRESENT ILLNESS:  This is a patient who is well-known to the  [facility] with the diagnosis of schizoaffective disorder  whose mother became concerned about the patient's behavior.  The patient  recognized her mother's concern and came to the [facility] for  treatment.  The patient endorses suicidal ideation without a plan.  She does  have some homicidal ideation toward her 57-year-old son.  She endorses  auditory hallucinations of people talking and laughing.  She endorses visual  hallucinations of people she describes losing time.  Also, she has been  experiencing decreased sleep with about six hours per night.  Her appetite  is good.  She describes depression, being sad without crying.  She says that  she has been acting criminal by shaking her head at the police and people  who look at her.   PAST PSYCHIATRIC HISTORY:  She has been inpatient at the [facility] two times in 2005, one time in 2004, two times in 2003 and several  admissions before that.  She is followed by [facility] as  an outpatient.   SOCIAL HISTORY:  She lives with her mother at a house in [place].  She is  on disability.  She has a high school diploma with 2-3 years of college. She  works some part-time work.  She is separated from her husband of seven  years.  She has a 18 and 49-year-old boy and girl.  Her father,  she says, is  her social support system as well as she has friends and she denies any  legal involvement at this time.   FAMILY HISTORY:  The patient states that the mother's side of the family has  a lot of schizophrenia.   ALCOHOL/DRUG HISTORY:  The patient states that she has a glass of wine or  two with her meals.  She denies any marijuana or other illicit drug use.  She states that she used tobacco occasionally, up until approximately six  years ago.   PRIMARY CARE PHYSICIAN:  [facility].   MEDICAL HISTORY:  Her medical problems include anemia as well as a history  of prescription medication-induced diabetes.   MEDICATIONS:  Her current medications include Zyprexa 15 mg, 2 tablets at  night and she takes the Depo-Provera shot for contraception.   ALLERGIES:  She has no known drug allergies.   PHYSICAL EXAMINATION:  GENERAL:  She was an overweight, well-developed,  African-American female.  NECK:  Full range of motion, 5/5 strength with no lymphadenopathy.  LUNGS:  Clear to auscultation bilaterally.  BREASTS:  Exam was deferred.  HEART:  Regular rate and rhythm without murmurs, rubs, or gallops.  She had  no carotid bruits and equal pulses in her periphery.  ABDOMEN:  Round, soft, nontender with normal bowel sounds.  GU:  Exam was deferred.  EXTREMITIES:  Full range of motion.  SKIN:  Warm and dry without lesions.  NEUROLOGIC:  Exam was grossly intact.   LABORATORY DATA:  Her CBC was within normal limits.  Her CMET was within  normal limits.  Her liver function test was normal with the exception of  albumin reading of 3.4 and her TSH lab was pending at this time.   MENTAL STATUS EXAM:  The patient is alert and oriented x 4.  Her appearance  is disheveled.  Her behavior was good eye contact and normal motor behavior.  Her speech was clear with even tone and volume.  She was mildly anxious and  mildly depressed.  Her thought process had flight of ideas.  She  endorses  suicidal and homicidal ideation as well as auditory or visual  hallucinations.  Cognitive function had decreased concentration.  Her memory  was impaired.  Her insight was poor and her impulse control was fair.   DIAGNOSES:   AXIS I:  Schizoaffective disorder.   AXIS II:  Deferred.   AXIS III:  Anemia.   AXIS IV:  Moderate (problems with primary support group and housing  problems).   AXIS V:  Current 27; past year 52.   PLAN:  To stabilize the patient to mood and thought.  Will maintain her  Zyprexa dosage at this time unless her behavior changes.  We will increase  her coping skills, decrease stressors and follow-up with [facility].   TENTATIVE LENGTH OF STAY:  Three to five days.      AHW/MEDQ  D:  11/18/2004  T:  11/18/2004  Job:  [id number]

## 2011-01-13 NOTE — H&P (Signed)
[facility]  Patient:    [name], [name] Visit Number: [id number] MRN: [id number]          Service Type: PSY Location: 400 [id number] [id number] Attending Physician:  Rachael Fee Dictated by:   [name], M.D. Admit Date:  09/21/2001                     Psychiatric Admission Assessment  DATE OF ADMISSION:  September 21, 2001  PATIENT IDENTIFICATION:  This is one of multiple admissions to [facility] for this 40 year old female who was petitioned by the police as she was wandering in her bathrobe in almost freezing temperatures.  HISTORY OF PRESENT ILLNESS:  The patient was last hospitalized at [facility] in September 2002 due to exacerbation of her mental illness.  She was pregnant and gave birth and when she gave birth, she was admitted for stabilization, placing her back on medications.  She was discharged more stable to stay by herself and follow up with [facility].  Apparently she did not followup, she went off the medications.  Yesterday she was seen wandering around her bathrobe in freezing temperatures and the police intervened.  The family really did not want to get involved.  She lives basically by herself.  So they petitioned her for involuntary commitment.  She seemed to be disoriented upon admission.  She did admit to auditory hallucinations.  PAST PSYCHIATRIC HISTORY:  Inpatient at [facility] and follow up at [facility].  More recently she was discharged on lithium, Zyprexa, and Cogentin.  SUBSTANCE ABUSE HISTORY:  Denies the use or abuse of any alcohol or drugs.  PAST MEDICAL HISTORY:  No medical problems.  MEDICATIONS:  Supposed to be on lithium, Risperdal, and Cogentin.  DRUG ALLERGIES:  Denies any drug allergies.  PHYSICAL EXAMINATION:  GENERAL:  Will be performed by a nurse practitioner.  SOCIAL HISTORY:   Lives alone.  Has had a couple of kids and they are all being taken care of by the father.  Relationship with family has been conflictive. She has threatened them in the past.  FAMILY HISTORY:  No clear history of psychiatric disorder.  MENTAL STATUS EXAMINATION UPON ADMISSION:  Well-nourished, well-developed, alert, cooperative female, somewhat disheveled upon admission, now cleaned up and in hospital gown.  She is spontaneous.  She answers what she is asked for but her thought process is circumstantial and at times tangential.  There is some underlying paranoia and she did endorse earlier auditory hallucinations. Mood is anxious, depressed.  Affect is constricted.  Thought process deals with being on the unit, not clear why, minimizing the circumstantiality, tangentiality.  There is some monologue flavor to her thought production. Cognitive: Well oriented to person and place, not time.  Memory is preserved except for events around her being hospitalized.  ADMISSION DIAGNOSES: Axis I:    Schizoaffective disorder. Axis II:   No diagnosis. Axis III:  No diagnosis. Axis IV:   Moderate. Axis V:    Global assessment of functioning upon admission 25-30, highest            global assessment of functioning in the last year 60.  INITIAL PLAN OF CARE:  We are going to provide a safe environment.  We are going to further find more information in terms of possible triggers for this decompensation.  We are going to reassess her medications and work  with her to find a medication regimen that she will be comfortable with and hopefully this time around that she will stay with.  We are going to work to improve her reality testing as well as coping skills and stress management. Dictated by:   [name], M.D. Attending Physician:  Rachael Fee DD:  09/21/01 TD:  09/21/01 Job: [id number]

## 2011-01-13 NOTE — Discharge Summary (Signed)
[name], [name] NO.:  [id number]   MEDICAL RECORD NO.:  [id number]          PATIENT TYPE:  IPS   LOCATION:  0407                          FACILITY:  BH   PHYSICIAN:  Jeanice Lim, M.D. DATE OF BIRTH:  August 11, 1971   DATE OF ADMISSION:  11/17/2004  DATE OF DISCHARGE:  11/21/2004                                 DISCHARGE SUMMARY   IDENTIFYING INFORMATION:  This is a 40 year old separated African-American  female voluntarily admitted with diagnosis of schizoaffective disorder.  The  patient was having suicidal thoughts without a plan.  Also having some  homicidal ideation towards 53-year-old son.  Endorsing auditory  hallucinations of people talking and laughing.  Also visual hallucinations.  The patient has been having decreased sleep.  The patient has been inpatient  prior to [facility].   MEDICAL HISTORY:  Anemia.   CURRENT MEDICATIONS:  Zyprexa 15 mg, 2 tabs at night, Depo-Provera injection  for contraception.   ALLERGIES:  No known drug allergies.   PHYSICAL EXAMINATION:  This is an overweight, well-developed, African-  American female in no acute distress.   LABORATORY DATA:  CBC and CMET were within normal limits.  Liver function  tests were normal. Albumin was mildly low at 3.4.  TSH is within normal  limits at 0.520.   MENTAL STATUS EXAM:  This is an alert, oriented female.  Disheveled.  Good  eye contact.  Normal motor movements.  Speech is clear and normal pace and  tone.  Mood mildly anxious.  The patient appears depressed.  There is some  flight of ideas.  Endorsing suicidal or homicidal ideation as well as  auditory or visual hallucinations with decreased concentration and memory  impaired.  Insight poor.  Impulse control fair.   ADMISSION DIAGNOSES:   AXIS I:  Schizoaffective disorder.   AXIS II:  Deferred.   AXIS III:  Anemia.   AXIS IV:  Problems with primary support group and housing.   AXIS V:  Current 27; this  patient year 58.   HOSPITAL COURSE:  To stabilize patient's mood and thinking.  To continue  with her Zyprexa.  The patient was to increase her coping skills as well as  to address her stressors.  The patient was improving, sleeping well with  psychotic symptoms decreasing daily.  Her psychotic symptoms were less  intense and was denying any suicidal thoughts.   CONDITION ON DISCHARGE:  There is no dangerous ideation.  Her mood, affect  and behavior were appropriate and consistent for this patient.  Her affect  was brighter.  Sleeping had improved.  Tolerating medications.  Medication  compliance and education were done.   DISCHARGE MEDICATIONS:  Zyprexa 15 mg, taking 2 at bedtime.   FOLLOW UP:  The patient was to follow up at [facility].  The patient was also to follow up with [facility] for her Depo-  Provera injection.  She was to see Dr. Lang Snow on November 24, 2004 at 3:30 p.m.  Phone number and address are provided.   DISCHARGE DIAGNOSES:   AXIS  I:  Schizoaffective disorder.   AXIS II:  Deferred.   AXIS III:  Anemia.   AXIS IV:  Problems with primary support group, housing, other psychosocial  problems related to chronic mental illness.   AXIS V:  Current 50; past year 7.      JO/MEDQ  D:  01/05/2005  T:  01/06/2005  Job:  [id number]

## 2011-04-16 ENCOUNTER — Emergency Department (HOSPITAL_COMMUNITY)
Admission: EM | Admit: 2011-04-16 | Discharge: 2011-04-18 | Disposition: A | Discharge status: Other Acute Inpatient Hospital | Payer: Medicare Other | Source: Home / Self Care | Attending: Emergency Medicine | Admitting: Emergency Medicine

## 2011-04-16 DIAGNOSIS — F329 Major depressive disorder, single episode, unspecified: Secondary | ICD-10-CM | POA: Insufficient documentation

## 2011-04-16 DIAGNOSIS — F3289 Other specified depressive episodes: Secondary | ICD-10-CM | POA: Insufficient documentation

## 2011-04-16 LAB — BASIC METABOLIC PANEL
CO2: 27 mEq/L (ref 19–32)
Calcium: 9.3 mg/dL (ref 8.4–10.5)
Creatinine, Ser: 0.83 mg/dL (ref 0.50–1.10)
GFR calc non Af Amer: >60 mL/min (ref >60)

## 2011-04-16 LAB — CBC
Hemoglobin: 12 g/dL (ref 12.0–15.0)
MCH: 31.5 pg (ref 26.0–34.0)
MCHC: 34.2 g/dL (ref 30.0–36.0)
RDW: 12.7 % (ref 11.5–15.5)

## 2011-04-16 LAB — RAPID URINE DRUG SCREEN, HOSP PERFORMED
Barbiturates: NOT DETECTED
Opiates: NOT DETECTED
Tetrahydrocannabinol: NOT DETECTED

## 2011-04-16 LAB — DIFFERENTIAL
Basophils Relative: 0 % (ref 0–1)
Eosinophils Absolute: 0.2 10*3/uL (ref 0.0–0.7)
Eosinophils Relative: 4 % (ref 0–5)
Monocytes Absolute: 0.5 10*3/uL (ref 0.1–1.0)
Monocytes Relative: 8 % (ref 3–12)
Neutro Abs: 2.6 10*3/uL (ref 1.7–7.7)

## 2011-04-16 LAB — ETHANOL: Alcohol, Ethyl (B): <11 mg/dL (ref 0–11)

## 2011-04-18 ENCOUNTER — Inpatient Hospital Stay (HOSPITAL_COMMUNITY)
Admission: AD | Admit: 2011-04-18 | Discharge: 2011-04-26 | DRG: 885 | Disposition: A | Discharge status: Home / Self Care | Payer: Medicare Other | Source: Ambulatory Visit | Attending: Psychiatry | Admitting: Psychiatry

## 2011-04-18 DIAGNOSIS — F200 Paranoid schizophrenia: Principal | ICD-10-CM

## 2011-04-18 DIAGNOSIS — R45851 Suicidal ideations: Secondary | ICD-10-CM

## 2011-04-18 DIAGNOSIS — F411 Generalized anxiety disorder: Secondary | ICD-10-CM

## 2011-04-18 DIAGNOSIS — Z79899 Other long term (current) drug therapy: Secondary | ICD-10-CM

## 2011-04-18 DIAGNOSIS — F3289 Other specified depressive episodes: Secondary | ICD-10-CM

## 2011-04-18 DIAGNOSIS — F329 Major depressive disorder, single episode, unspecified: Secondary | ICD-10-CM

## 2011-04-19 DIAGNOSIS — F39 Unspecified mood [affective] disorder: Secondary | ICD-10-CM

## 2011-04-21 LAB — LIPID PANEL
Cholesterol: 180 mg/dL (ref 0–200)
Triglycerides: 113 mg/dL (ref <150)

## 2011-04-21 LAB — HEMOGLOBIN A1C
Hgb A1c MFr Bld: 5.7 % — ABNORMAL HIGH (ref <5.7)
Mean Plasma Glucose: 117 mg/dL — ABNORMAL HIGH (ref <117)

## 2011-04-25 NOTE — Assessment & Plan Note (Signed)
[name], [name] NO.:  [id number]  MEDICAL RECORD NO.:  [id number]  LOCATION:  0407                          FACILITY:  BH  PHYSICIAN:  Eulogio Ditch, MD DATE OF BIRTH:  09-30-1970  DATE OF ADMISSION:  04/18/2011 DATE OF DISCHARGE:                      PSYCHIATRIC ADMISSION ASSESSMENT   IDENTIFICATION:  This is a 40 year old single African American female. This is a voluntary admission.  HISTORY OF PRESENT ILLNESS:  This is one of several [facility] admissions for [name], a pleasant but sad appearing 29 year old mother of 2 who presents with some passive suicidal thoughts after getting into an argument with her mother.  She has been living with her mother and 2 children since June 2012 after she moved out of an assisted living facility.  She is very motivated to care for the children and contribute to the household but has a history of some difficulty processing stressful situations, and recently endorses stressors of one of the dogs at home biting her child and getting the children ready to start school on Monday.  Today, she expresses a lot of thoughts about wondering if it is better for her to be in the home versus going back to assisted living.  Feels conflicted but loves her mother and children and has no desire to hurt anyone.  Today she denies suicidal thoughts but is depressed and confused about her role.  PAST PSYCHIATRIC HISTORY:  Currently followed as an outpatient at [facility] for medication management.  [name] has a history of schizophrenia versus schizoaffective disorder and is generally compliant with her medications.  No known history of suicide attempts.  SOCIAL HISTORY:  Single African American female.  She was previously married and her marital situation is now unclear, though she says her husband is not with her.  She is living with her mother and her 2 children,  Aisha age 71 and Ishmael age 58.  FAMILY HISTORY:   Negative for mental illness or substance abuse.  ALCOHOL AND DRUG HISTORY:  No history of substance abuse.  MEDICAL HISTORY:  Primary care physician is unknown.  Medical problems are none.  MEDICAL SCREENING:  Physical exam was done in the emergency room and is noted in the record.  [name] is a normally-developed African American female with no abnormal movements.  Gait is normal.  Normal arm swing. Smooth motor exam.  Urine drug screen negative for all substances. Alcohol screen negative.  Chemistry normal BUN 11, creatinine 0.83.  CBC normal with a hemoglobin of 12.0.  CURRENT MEDICATIONS: 1. Cogentin 1 mg p.o. q.h.s. 2. Depakote 500 mg 2 tablets p.o. q.h.s. 3. Trazodone 100 mg 1 tablet h.s. p.r.n. insomnia. 4. Fish oil 1 gram p.o. b.i.d. 5. Risperdal Consta 50 mg q. 2 weeks.  Last dose March 23, 2011. 6. Risperdal p.o. 2 mg, one tablet daily at bedtime.  MENTAL STATUS EXAM:  Fully alert female, cooperative, good eye contact, soft-spoken. Her speech is non-pressured, rather deliberate.  She struggles to organize her thoughts but talks about various stressors at home, how she is trying to get along with her mother.  Wants to have a role in running the household and being a mother to her  children.  No dangerous thoughts voiced. She does not appear internally distracted. Memory intact.  Insight poor judgment fair to poor impulse control normal.  DIAGNOSES:  Axis I:  Schizophrenia, disorganized type versus schizoaffective disorder. Axis II:  No diagnosis. Axis III.  No diagnosis. Axis IV:  Chronic issues with stage of life.  Supportive family in a safe home situation is an asset. Axis V:  Current is 32, past year 50 (estimated).  PLAN:  To voluntarily admit her with a goal of stabilizing her mood and alleviating any suicidal thoughts.  We are going to do some more basic testing including a UA, valproate level, hemoglobin A1c, TSH, fasting lipids and get a baseline a EKG.  We  will talk with mental health and check on her medication compliance and her Risperdal Consta schedule.     Margaret A. Lorin Picket, N.P.   ______________________________ Eulogio Ditch, MD    MAS/MEDQ  D:  04/19/2011  T:  04/20/2011  Job:  [id number]  Electronically Signed by Kari Baars N.P. on 04/20/2011 08:15:01 AM Electronically Signed by Eulogio Ditch  on 04/25/2011 02:01:05 PM

## 2011-05-03 NOTE — Discharge Summary (Signed)
  [name], [name]                ACCOUNT NO.:  [id number]  MEDICAL RECORD NO.:  [id number]  LOCATION:  0407                          FACILITY:  BH  PHYSICIAN:  Eulogio Ditch, MD DATE OF BIRTH:  Aug 14, 1971  DATE OF ADMISSION:  04/18/2011 DATE OF DISCHARGE:  04/26/2011                              DISCHARGE SUMMARY   HISTORY OF PRESENT ILLNESS:  Please review the initial psych assessment for details.  Briefly, a 40 year old African American female was admitted for having suicidal thoughts after getting her into an argument with her mother.  HOSPITAL COURSE:  During the hospital stay the patient was started on Depakote 1000 mg at bedtime, Risperdal 2 mg at bedtime, Cogentin 1 mg at bedtime.  These medications she was taking in the outpatient setting also.  No change in the medications was made.  The patient also gets Risperdal Consta 50 mg IM every 2 weeks.  The patient participated in all the groups and remained compliant with the treatment.  By April 26, 2011, the patient was logical and goal-directed, not suicidal or homicidal, not internally preoccupied.  Was able to make a good conversation in the treatment team.  The patient was advised to follow up regularly in the outpatient setting and to work on her coping skills to deal with the stress.  DISCHARGE DIAGNOSES:  Axis I:  Chronic schizophrenia, paranoid type. Axis II:  Deferred. Axis III:  No active medical issue. Axis IV:  Chronic mental health issues. Axis V:  55.  DISCHARGE MEDICATIONS: 1. Cogentin 1 mg at bedtime. 2. Depakote 1000 at bedtime. 3. Risperdal 2 mg at bedtime. 4. Trazodone 100 mg at bedtime as needed for insomnia.  LABORATORY DATA:  Her Depakote level on August 24 was 87.4.  Lipid profile within normal limits.  TSH 1.029.  Hemoglobin A1c 5.7, within normal range.  RPR nonreactive.  DISCHARGE FOLLOWUP:  The patient will follow up at __________  and [facility], phone number [phone],  appointment August 30 at 11:00 a.m., and also she will follow up at [facility] as a walk-in.    Eulogio Ditch, MD    SA/MEDQ  D:  04/26/2011  T:  04/26/2011  Job:  [id number]  Electronically Signed by Eulogio Ditch  on 05/03/2011 02:58:28 PM

## 2011-06-06 ENCOUNTER — Emergency Department (HOSPITAL_COMMUNITY)
Admission: EM | Admit: 2011-06-06 | Discharge: 2011-06-07 | Disposition: A | Discharge status: Home / Self Care | Payer: Medicare Other | Attending: Emergency Medicine | Admitting: Emergency Medicine

## 2011-06-06 DIAGNOSIS — F341 Dysthymic disorder: Secondary | ICD-10-CM | POA: Insufficient documentation

## 2011-06-06 DIAGNOSIS — Z79899 Other long term (current) drug therapy: Secondary | ICD-10-CM | POA: Insufficient documentation

## 2011-06-06 DIAGNOSIS — F319 Bipolar disorder, unspecified: Secondary | ICD-10-CM | POA: Insufficient documentation

## 2011-06-06 DIAGNOSIS — Z8659 Personal history of other mental and behavioral disorders: Secondary | ICD-10-CM | POA: Insufficient documentation

## 2011-06-07 LAB — DIFFERENTIAL
Basophils Absolute: 0 10*3/uL (ref 0.0–0.1)
Basophils Relative: 1 % (ref 0–1)
Neutro Abs: 3 10*3/uL (ref 1.7–7.7)
Neutrophils Relative %: 45 % (ref 43–77)

## 2011-06-07 LAB — COMPREHENSIVE METABOLIC PANEL
ALT: 12 U/L (ref 0–35)
Alkaline Phosphatase: 54 U/L (ref 39–117)
CO2: 26 mEq/L (ref 19–32)
GFR calc Af Amer: >90 mL/min (ref >90)
GFR calc non Af Amer: >90 mL/min (ref >90)
Glucose, Bld: 100 mg/dL — ABNORMAL HIGH (ref 70–99)
Potassium: 4.1 mEq/L (ref 3.5–5.1)
Sodium: 137 mEq/L (ref 135–145)
Total Protein: 7.2 g/dL (ref 6.0–8.3)

## 2011-06-07 LAB — CBC
Hemoglobin: 11.9 g/dL — ABNORMAL LOW (ref 12.0–15.0)
RBC: 3.86 MIL/uL — ABNORMAL LOW (ref 3.87–5.11)
WBC: 6.6 10*3/uL (ref 4.0–10.5)

## 2011-06-07 LAB — RAPID URINE DRUG SCREEN, HOSP PERFORMED
Cocaine: NOT DETECTED
Opiates: NOT DETECTED
Tetrahydrocannabinol: NOT DETECTED

## 2011-06-13 ENCOUNTER — Emergency Department (HOSPITAL_COMMUNITY)
Admission: EM | Admit: 2011-06-13 | Discharge: 2011-06-13 | Disposition: A | Discharge status: Home / Self Care | Payer: Medicare Other | Source: Home / Self Care | Attending: Emergency Medicine | Admitting: Emergency Medicine

## 2011-06-13 ENCOUNTER — Emergency Department (HOSPITAL_COMMUNITY)
Admission: EM | Admit: 2011-06-13 | Discharge: 2011-06-16 | Disposition: A | Discharge status: Other Acute Inpatient Hospital | Payer: Medicare Other | Source: Home / Self Care | Attending: Emergency Medicine | Admitting: Emergency Medicine

## 2011-06-13 DIAGNOSIS — F319 Bipolar disorder, unspecified: Secondary | ICD-10-CM | POA: Insufficient documentation

## 2011-06-13 DIAGNOSIS — F209 Schizophrenia, unspecified: Secondary | ICD-10-CM | POA: Insufficient documentation

## 2011-06-13 DIAGNOSIS — R51 Headache: Secondary | ICD-10-CM | POA: Insufficient documentation

## 2011-06-13 DIAGNOSIS — F411 Generalized anxiety disorder: Secondary | ICD-10-CM | POA: Insufficient documentation

## 2011-06-13 DIAGNOSIS — Z79899 Other long term (current) drug therapy: Secondary | ICD-10-CM | POA: Insufficient documentation

## 2011-06-13 DIAGNOSIS — Z8659 Personal history of other mental and behavioral disorders: Secondary | ICD-10-CM | POA: Insufficient documentation

## 2011-06-13 LAB — POCT PREGNANCY, URINE: Preg Test, Ur: NEGATIVE

## 2011-06-13 LAB — BASIC METABOLIC PANEL
CO2: 27 mEq/L (ref 19–32)
Calcium: 10.3 mg/dL (ref 8.4–10.5)
Creatinine, Ser: 0.83 mg/dL (ref 0.50–1.10)
Glucose, Bld: 96 mg/dL (ref 70–99)

## 2011-06-13 LAB — CBC
Hemoglobin: 12.7 g/dL (ref 12.0–15.0)
MCH: 30.5 pg (ref 26.0–34.0)
MCHC: 33.2 g/dL (ref 30.0–36.0)

## 2011-06-13 LAB — RAPID URINE DRUG SCREEN, HOSP PERFORMED
Amphetamines: NOT DETECTED
Barbiturates: NOT DETECTED
Tetrahydrocannabinol: NOT DETECTED

## 2011-06-13 LAB — ETHANOL: Alcohol, Ethyl (B): <11 mg/dL (ref 0–11)

## 2011-06-16 ENCOUNTER — Inpatient Hospital Stay (HOSPITAL_COMMUNITY)
Admission: RE | Admit: 2011-06-16 | Discharge: 2011-06-26 | DRG: 885 | Disposition: A | Discharge status: Home / Self Care | Payer: Medicare Other | Source: Ambulatory Visit | Attending: Psychiatry | Admitting: Psychiatry

## 2011-06-16 DIAGNOSIS — J45909 Unspecified asthma, uncomplicated: Secondary | ICD-10-CM

## 2011-06-16 DIAGNOSIS — Z111 Encounter for screening for respiratory tuberculosis: Secondary | ICD-10-CM

## 2011-06-16 DIAGNOSIS — F200 Paranoid schizophrenia: Principal | ICD-10-CM

## 2011-06-16 DIAGNOSIS — D649 Anemia, unspecified: Secondary | ICD-10-CM

## 2011-06-16 DIAGNOSIS — Z79899 Other long term (current) drug therapy: Secondary | ICD-10-CM

## 2011-06-16 DIAGNOSIS — J3489 Other specified disorders of nose and nasal sinuses: Secondary | ICD-10-CM

## 2011-06-16 DIAGNOSIS — Z818 Family history of other mental and behavioral disorders: Secondary | ICD-10-CM

## 2011-06-16 DIAGNOSIS — F209 Schizophrenia, unspecified: Secondary | ICD-10-CM

## 2011-06-18 LAB — VALPROIC ACID LEVEL: Valproic Acid Lvl: 63.3 ug/mL (ref 50.0–100.0)

## 2011-06-21 NOTE — Assessment & Plan Note (Signed)
[name]                ACCOUNT NO.:  [id number]  MEDICAL RECORD NO.:  [id number]  LOCATION:  0402                          FACILITY:  BH  PHYSICIAN:  Eulogio Ditch, MD DATE OF BIRTH:  [birth year]  DATE OF ADMISSION:  06/16/2011 DATE OF DISCHARGE:                      PSYCHIATRIC ADMISSION ASSESSMENT   IDENTIFYING INFORMATION:  This is a 40 year old female.  This is a voluntary admission.  HISTORY OF PRESENT ILLNESS:  This is 1 of several [facility] admissions for [name], who reports that she took a bus 1st to [facility] and was sent to [facility] and eventually sent back to our emergency room.  She brought herself to the hospital because she felt she could not cope with the arguing that was going on between herself and her mother for the past week or so.  Her mother is head of the household and [place] lives in her home with her own 65 and 54 year old daughters.  She says she feels restricted in what she is allowed to do, cannot come out of her room, mother yells at her if she brings any of her things out of her room.  She feels unable to cope.  She has not had any specific plans for suicide but feels unsafe at home.  She does not use substances.  She is cooperative today asking for help getting placed somewhere and feels unable to go home.  PAST PSYCHIATRIC HISTORY:  [name] has a history of schizophrenia, paranoid type and has had at least 12 [facility] admissions since 2002.  She also has a history of previous hospitalizations at [facility] and [facility].  Most recently, she was on our unit from August 21st to 29th 2012 again due to dissension in the home.  She has been quite stable on Risperdal, Depakote and Cogentin but her Depakote level on the last admission was found to be subtherapeutic.  She has a history of other past medication trials including Paxil, Zyprexa, lithium and Loxitane.  She is followed as an outpatient by Dr. Betti Cruz  for medications and is followed at [facility] and [facility]. Her compliance is unknown.  SOCIAL HISTORY:  She was previously married for several years and ultimately separated from her husband.  She currently lives with her mother and her 31 and 22 year old daughters in the home.  Today she is unable to tell me how many children totally she has had.  There are 2 living in the home now.  They also get assistance from uncles and other family members.  She is on disability for mental health reasons and has endorsed a lot of recent chronic conflict with her mother.  She has no legal charges.  FAMILY HISTORY:  The record reflects that she had an aunt with schizophrenia.  ALCOHOL AND DRUG HISTORY:  She has a very distant history of cocaine abuse in remission greater than 5 years.  MEDICAL HISTORY:  No regular primary care provider.  Medical problems are none.  PAST MEDICAL HISTORY:  Significant for anemia.  CURRENT MEDICATIONS ARE: 1. Depakote 500 mg 2 tabs at bedtime. 2. Cogentin 1 mg at bedtime. 3. Risperdal 2 mg at bedtime.  DRUG ALLERGIES:  HALDOL which  causes dystonia and itching.  POSITIVE PHYSICAL FINDINGS:  This is a normally developed, [place] American female with no abnormal movements.  AIM score is negative.  She weighs 97 kg, 5 feet 7 inches tall.  12-POINT REVIEW OF SYSTEMS:  Was performed and remarkable for some mild clear rhinorrhea a couple of days ago.  Denies any fever, chills or recent infections.  She has had no hospitalizations since she was here at [facility] in August.  PHYSICAL EXAM:  Full physical exam was performed and is noted in the record and is generally unremarkable.  DIAGNOSTIC STUDIES:  Were done in the emergency room.  Valproate level 24.5.  Urine drug screen negative.  Alcohol level negative.  CBC normal, and chemistry normal.  MENTAL STATUS EXAM:  Fully alert female alert, cooperative with a mildly guarded affect, poor eye contact.   She is rather disheveled and dressed in a hospital gown.  Speech is normal in tone.  Recites a long monologue narrative with rather flat cadence regarding the recent arguments with her mother, and difficulties that she has had feeling that she cannot comply with her mother's demands at home.  Mood is depressed, mildly anxious, thinking very concrete, denies any active suicidal thoughts today.  Judgment is fair.  Insight is poor.  She is not sure what to do to make the situation better but that just feels that she needs to get out of the house.  Feels that she did better when she was at an assisted living facility and she is oriented to person, place, and situation. Remote and working memory are completely intact.  AXIS I:  Schizophrenia paranoid type versus undifferentiated type, chronic. AXIS II:  Deferred. AXIS III:  No diagnosis. AXIS IV:  Chronic family discord. AXIS V:  Current 40, past year 55 estimated.  PLAN:  Is to voluntarily admit her with a goal of alleviating her anxiety and improving her reality testing. Going to continue her routine medications and on October 21st we will check a Depakote level.  Her hemoglobin A1c, lipid panel, and TSH were all checked in August and were normal we will not redo those today.  She is on our acute stabilization program and has been cooperative with good participation in group so far.     [name], N.P.   ______________________________ Eulogio Ditch, MD    MAS/MEDQ  D:  06/16/2011  T:  06/16/2011  Job:  [id number]  Electronically Signed by Kari Baars N.P. on 06/19/2011 02:02:53 PM Electronically Signed by Eulogio Ditch  on 06/21/2011 02:35:48 PM

## 2011-06-29 NOTE — Discharge Summary (Signed)
[name], [name]                ACCOUNT NO.:  [id number]  MEDICAL RECORD NO.:  [id number]  LOCATION:  0402                          FACILITY:  [facility]  PHYSICIAN:  Eulogio Ditch, MD DATE OF BIRTH:  [birth year]  DATE OF ADMISSION:  06/16/2011 DATE OF DISCHARGE:  06/26/2011                              DISCHARGE SUMMARY   IDENTIFYING INFORMATION:  This is a 40 year old, separated African American female.  This was a voluntary admission.  HISTORY OF PRESENT ILLNESS:  This is one of several admissions for [name] who reported that she initially took the bus to [facility] and was sent to [facility], and eventually sent back to our emergency room.  She was agitated and worried feeling that she could not cope with the arguing that was going on between herself and her mother for the past week or so.  This is a typical complaint for [name] who has had difficulty coping with independent living in her mother's household with her own 13 and 50 year old daughters.  This is a typical complaint for [name] who was most recently on our unit in August for similar complaint again feeling that she could not cope with the atmosphere at home.  She has a history of schizophrenia paranoid type, and at least 12 [facility] admissions since 2002.  She did not have any specific plan for suicide but felt unsafe at home.  MEDICAL EVALUATIONS:  She was medically evaluated in the [facility] emergency room where her full physical exam and review of systems was Performed.  Aim score negative, no abnormal movements.  She is a normally developed, adequately nourished, African American female weighing 97 kg, 5 feet 7 inches tall.  REVIEW OF SYSTEMS:  Revealed some recent clear rhinorrhea.  No fever, chills or infections and generally physically in good health.  DIAGNOSTIC STUDIES:  Were done in the emergency room.  Her CBC and chemistries were found to be normal.  Urine drug screen negative. Alcohol  level negative.  Valproate level 24.5, and urine pregnancy test negative.  COURSE OF HOSPITALIZATION:  She was admitted to our acute stabilization unit.  She was given a provisional diagnosis of schizophrenia paranoid type versus undifferentiated type, chronic.  We continued her routine home medications of Depakote 500 mg 2 tabs at bedtime, Cogentin 1 mg at bedtime and Risperdal 2 mg at bedtime.  She was cooperative while on the unit though initially rather disorganized and disheveled.  Mildly anxious.  She was cooperative at all times, with no aggressive behavior, accepted redirection and was appropriate in groups and with unit activities.  She gradually stabilized on the medications and her productivity in group therapy improved.  Throughout her stay she consistently denied any active suicidal thoughts, but was very clear that she did not want to return to her mother's home, and was requesting assisted living facility placement. Our case worker spoke with her mother, who confirmed that she could nolonger stay there.  Mother reported that time [name] was unable to effectively parent her children who live in the household and interfered with her mother when her mother attempted to set limits and set some structure for the children. A repeat  valproate level on October 21st was 63.3.  She was given an injection of Risperdal Consta 50 mg IM on June 21, 2011.  She was stable and ready for transfer to assisted living facility by October 26 and she was accepted at a [facility] in [place] and she was in agreement with being transferred there and we made arrangements for her to be followed by an ACT team.  DISCHARGE PLAN:  Followup with the [facility] ACT team on June 30, 2011 at 1:00 p.m. at the group home where she will reside.  DISCHARGE DIAGNOSIS:  Axis I:  Schizophrenia paranoid type, chronic with acute exacerbation, stabilized. Axis II.  Deferred. Axis III:  No  diagnosis. Axis IV:  Chronic parenting and issues and burden of mental health issues. Axis V:  Current 55, past year 55 estimated.  DISCHARGE MEDICATIONS:  Risperdal Consta 50 mg IM every 2 weeks last given June 21, 2011. Cogentin 1 tab 1 mg at bedtime. Risperdal 2 mg p.o. at bedtime. Depakote 1000 mg p.o. at bedtime.     Margaret A. Lorin Picket, N.P.   ______________________________ Eulogio Ditch, MD    MAS/MEDQ  D:  06/27/2011  T:  06/27/2011  Job:  [id number]  Electronically Signed by Kari Baars N.P. on 06/27/2011 03:09:27 PM Electronically Signed by Eulogio Ditch  on 06/29/2011 12:07:50 PM

## 2011-12-01 ENCOUNTER — Encounter: Payer: Medicare Other | Admitting: Physician Assistant

## 2012-04-03 ENCOUNTER — Encounter (HOSPITAL_COMMUNITY): Payer: Self-pay | Admitting: *Deleted

## 2012-04-03 ENCOUNTER — Ambulatory Visit (HOSPITAL_COMMUNITY): Admission: RE | Admit: 2012-04-03 | Payer: Medicare Other | Source: Home / Self Care | Admitting: Psychiatry

## 2012-04-03 ENCOUNTER — Emergency Department (EMERGENCY_DEPARTMENT_HOSPITAL)
Admission: EM | Admit: 2012-04-03 | Discharge: 2012-04-04 | Disposition: A | Discharge status: Other Acute Inpatient Hospital | Payer: Medicare Other | Source: Home / Self Care | Attending: Emergency Medicine | Admitting: Emergency Medicine

## 2012-04-03 DIAGNOSIS — Z87891 Personal history of nicotine dependence: Secondary | ICD-10-CM | POA: Insufficient documentation

## 2012-04-03 DIAGNOSIS — F209 Schizophrenia, unspecified: Secondary | ICD-10-CM | POA: Insufficient documentation

## 2012-04-03 HISTORY — DX: Anemia, unspecified: D64.9

## 2012-04-03 LAB — COMPREHENSIVE METABOLIC PANEL
ALT: 11 U/L (ref 0–35)
Albumin: 4.1 g/dL (ref 3.5–5.2)
Alkaline Phosphatase: 60 U/L (ref 39–117)
Potassium: 3.7 mEq/L (ref 3.5–5.1)
Sodium: 140 mEq/L (ref 135–145)
Total Protein: 7.7 g/dL (ref 6.0–8.3)

## 2012-04-03 LAB — CBC
MCHC: 33.6 g/dL (ref 30.0–36.0)
RDW: 13.4 % (ref 11.5–15.5)

## 2012-04-03 LAB — RAPID URINE DRUG SCREEN, HOSP PERFORMED
Amphetamines: NOT DETECTED
Barbiturates: NOT DETECTED
Benzodiazepines: NOT DETECTED
Cocaine: NOT DETECTED

## 2012-04-03 LAB — ETHANOL: Alcohol, Ethyl (B): <11 mg/dL (ref 0–11)

## 2012-04-03 LAB — POCT PREGNANCY, URINE: Preg Test, Ur: NEGATIVE

## 2012-04-03 NOTE — ED Notes (Signed)
AC called. Notified sitter has not arrived. AC informed me it will be addressed.

## 2012-04-03 NOTE — ED Notes (Signed)
ACT team at bedside.  
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
## 2012-04-03 NOTE — ED Notes (Signed)
Telepsych consult in progress

## 2012-04-03 NOTE — ED Notes (Signed)
Pt reports when she takes her risperdol, cogentin and depakote she has felt her feet slipping under her. Pt has loose associations. States "I've not been fussing too much, when I eat or drink, it burns in my mouth." Pt then starts to talk about going back to college. When asked if she has SI "states I feel endangered, not real thoughts of cutting self".

## 2012-04-03 NOTE — ED Notes (Signed)
Pt back from telepsych consult.

## 2012-04-03 NOTE — ED Provider Notes (Signed)
History     CSN: [id number]  Arrival date & time 04/03/12  1344   First MD Initiated Contact with Patient 04/03/12 1744      Chief Complaint  Patient presents with  . Medical Clearance    (Consider location/radiation/quality/duration/timing/severity/associated sxs/prior treatment) HPI Comments: Patients with history of schizophrenia, paranoid type presents today with a sensation of lightheaded and dizziness. Patient states that it feels like her feet are slipping out from underneath her. She thinks that her Depakote level may be high and would like to have this checked. The patient also states that she feels like she is getting mad at people for no reason. She states that she's been getting Risperdal shots every 2 weeks. She states that she did not want to take her last shot because of the way it makes her feel. She denies suicidal or homicidal ideation. She is unsure about feeling safe at home. Onset of symptoms were gradual. Course is constant. Nothing makes symptoms better or worse.   The history is provided by the patient.    Past Medical History  Diagnosis Date  . Anemia     Past Surgical History  Procedure Date  . Tonsillectomy     No family history on file.  History  Substance Use Topics  . Smoking status: Former Games developer  . Smokeless tobacco: Not on file  . Alcohol Use: No    OB History    Grav Para Term Preterm Abortions TAB SAB Ect Mult Living                  Review of Systems  Constitutional: Negative for fever.  HENT: Negative for sore throat and rhinorrhea.   Eyes: Negative for redness.  Respiratory: Negative for cough.   Cardiovascular: Negative for chest pain and palpitations.  Gastrointestinal: Negative for nausea, vomiting, abdominal pain and diarrhea.  Genitourinary: Negative for dysuria.  Musculoskeletal: Negative for myalgias.  Skin: Negative for rash.  Neurological: Positive for light-headedness. Negative for headaches.    Psychiatric/Behavioral: Negative for suicidal ideas. The patient is nervous/anxious.     Allergies  Haldol and Penicillins  Home Medications   Current Outpatient Rx  Name Route Sig Dispense Refill  . ASPIRIN 325 MG PO TABS Oral Take 650 mg by mouth every 4 (four) hours as needed. Pain    . BENZTROPINE MESYLATE 1 MG PO TABS Oral Take 1 mg by mouth daily.     Marland Kitchen DIVALPROEX SODIUM 500 MG PO TBEC Oral Take 1,500 mg by mouth at bedtime.     Marland Kitchen RISPERIDONE 2 MG PO TABS Oral Take 2 mg by mouth at bedtime.     Marland Kitchen RISPERIDONE MICROSPHERES 50 MG IM SUSR Intramuscular Inject 50 mg into the muscle every 14 (fourteen) days.      BP 121/90  Pulse 87  Temp 98.2 F (36.8 C) (Oral)  Resp 16  SpO2 98%  Physical Exam  Nursing note and vitals reviewed. Constitutional: She appears well-developed and well-nourished.  HENT:  Head: Normocephalic and atraumatic.  Eyes: Conjunctivae are normal. Right eye exhibits no discharge. Left eye exhibits no discharge.  Neck: Normal range of motion. Neck supple.  Cardiovascular: Normal rate, regular rhythm and normal heart sounds.   Pulmonary/Chest: Effort normal and breath sounds normal.  Abdominal: Soft. There is no tenderness.  Neurological: She is alert. She has normal strength. No cranial nerve deficit or sensory deficit. Coordination normal. GCS eye subscore is 4. GCS verbal subscore is 5. GCS motor subscore is 6.  Skin: Skin is warm and dry.  Psychiatric: Her mood appears anxious. Her speech is tangential. She is not agitated and not aggressive. She expresses no homicidal and no suicidal ideation.    ED Course  Procedures (including critical care time)  Labs Reviewed  CBC - Abnormal; Notable for the following:    HCT 35.7 (*)     All other components within normal limits  VALPROIC ACID LEVEL - Abnormal; Notable for the following:    Valproic Acid Lvl 13.5 (*)     All other components within normal limits  COMPREHENSIVE METABOLIC PANEL  ETHANOL   URINE RAPID DRUG SCREEN (HOSP PERFORMED)  POCT PREGNANCY, URINE   No results found.   1. Schizophrenia     5:55 PM Patient seen and examined. Work-up reviewed, Depakote level added. Discussed with Dr. Ranae Palms. Will have ACT eval given history.   Vital signs reviewed and are as follows: Filed Vitals:   04/03/12 1348  BP: 121/90  Pulse: 87  Temp: 98.2 F (36.8 C)  Resp: 16   7:52 PM Depakote low. ACT informed and will see.   ACT has seen patient and telepsych consult performed. Psychiatrist recommends inpatient psychiatric treatment and IVC. Holding orders/med rec are complete.  1:47 AM Sign-out to Dr. Judd Lien.    MDM  Patient pending inpatient psych placement.        Renne Crigler, [place] 04/04/12 [phone]

## 2012-04-03 NOTE — ED Notes (Signed)
ACT team informed me the pt will be admitted to inpatient. ACT team will give MD forms.

## 2012-04-03 NOTE — ED Notes (Signed)
Still waiting on telepsych consult. No phone call received. Paper work was faxed and telepsych has already been called.

## 2012-04-04 ENCOUNTER — Encounter (HOSPITAL_COMMUNITY): Payer: Self-pay | Admitting: *Deleted

## 2012-04-04 ENCOUNTER — Inpatient Hospital Stay (HOSPITAL_COMMUNITY)
Admission: RE | Admit: 2012-04-04 | Discharge: 2012-04-12 | DRG: 885 | Disposition: A | Discharge status: Home / Self Care | Payer: Medicare Other | Attending: Psychiatry | Admitting: Psychiatry

## 2012-04-04 DIAGNOSIS — F200 Paranoid schizophrenia: Secondary | ICD-10-CM

## 2012-04-04 DIAGNOSIS — F251 Schizoaffective disorder, depressive type: Secondary | ICD-10-CM | POA: Diagnosis present

## 2012-04-04 DIAGNOSIS — F259 Schizoaffective disorder, unspecified: Principal | ICD-10-CM | POA: Diagnosis present

## 2012-04-04 DIAGNOSIS — D649 Anemia, unspecified: Secondary | ICD-10-CM | POA: Diagnosis present

## 2012-04-04 DIAGNOSIS — R42 Dizziness and giddiness: Secondary | ICD-10-CM | POA: Diagnosis present

## 2012-04-04 MED ORDER — NICOTINE 21 MG/24HR TD PT24
21.0000 mg | MEDICATED_PATCH | Freq: Every day | TRANSDERMAL | Status: DC
  Filled 2012-04-04: qty 1

## 2012-04-04 MED ORDER — DIVALPROEX SODIUM 500 MG PO DR TAB
1500.0000 mg | DELAYED_RELEASE_TABLET | Freq: Every day | ORAL | Status: DC
  Administered 2012-04-04: 1500 mg via ORAL
  Filled 2012-04-04: qty 3

## 2012-04-04 MED ORDER — ONDANSETRON HCL 4 MG PO TABS: 4.0000 mg | ORAL_TABLET | Freq: Three times a day (TID) | ORAL | Status: DC | PRN

## 2012-04-04 MED ORDER — ACETAMINOPHEN 325 MG PO TABS: 650.0000 mg | ORAL_TABLET | Freq: Four times a day (QID) | ORAL | Status: DC | PRN

## 2012-04-04 MED ORDER — BENZTROPINE MESYLATE 1 MG PO TABS
1.0000 mg | ORAL_TABLET | Freq: Every day | ORAL | Status: DC
  Administered 2012-04-04: 1 mg via ORAL
  Filled 2012-04-04: qty 1

## 2012-04-04 MED ORDER — TRAZODONE HCL 50 MG PO TABS
50.0000 mg | ORAL_TABLET | Freq: Every evening | ORAL | Status: DC | PRN
  Administered 2012-04-04 – 2012-04-07 (×3): 50 mg via ORAL
  Filled 2012-04-04 (×2): qty 1
  Filled 2012-04-04: qty 6
  Filled 2012-04-04: qty 1

## 2012-04-04 MED ORDER — ACETAMINOPHEN 325 MG PO TABS: 650.0000 mg | ORAL_TABLET | ORAL | Status: DC | PRN

## 2012-04-04 MED ORDER — RISPERIDONE 2 MG PO TABS
2.0000 mg | ORAL_TABLET | Freq: Every day | ORAL | Status: DC
  Administered 2012-04-04: 2 mg via ORAL
  Filled 2012-04-04: qty 1

## 2012-04-04 MED ORDER — ALUM & MAG HYDROXIDE-SIMETH 200-200-20 MG/5ML PO SUSP: 30.0000 mL | ORAL | Status: DC | PRN

## 2012-04-04 MED ORDER — IBUPROFEN 600 MG PO TABS
600.0000 mg | ORAL_TABLET | Freq: Three times a day (TID) | ORAL | Status: DC | PRN
  Administered 2012-04-04: 600 mg via ORAL
  Filled 2012-04-04: qty 1

## 2012-04-04 MED ORDER — DIVALPROEX SODIUM 500 MG PO DR TAB
1500.0000 mg | DELAYED_RELEASE_TABLET | Freq: Every day | ORAL | Status: DC
  Administered 2012-04-04 – 2012-04-11 (×8): 1500 mg via ORAL
  Filled 2012-04-04 (×3): qty 3
  Filled 2012-04-04: qty 9
  Filled 2012-04-04 (×10): qty 3

## 2012-04-04 MED ORDER — LORAZEPAM 1 MG PO TABS: 1.0000 mg | ORAL_TABLET | Freq: Three times a day (TID) | ORAL | Status: DC | PRN

## 2012-04-04 MED ORDER — ASPIRIN 325 MG PO TABS: 650.0000 mg | ORAL_TABLET | ORAL | Status: DC | PRN

## 2012-04-04 MED ORDER — BENZTROPINE MESYLATE 1 MG PO TABS
1.0000 mg | ORAL_TABLET | Freq: Every day | ORAL | Status: DC
  Administered 2012-04-04 – 2012-04-05 (×2): 1 mg via ORAL
  Filled 2012-04-04 (×6): qty 1

## 2012-04-04 MED ORDER — RISPERIDONE 2 MG PO TABS
2.0000 mg | ORAL_TABLET | Freq: Every day | ORAL | Status: DC
  Administered 2012-04-04 – 2012-04-11 (×8): 2 mg via ORAL
  Filled 2012-04-04 (×3): qty 1
  Filled 2012-04-04: qty 3
  Filled 2012-04-04 (×7): qty 1

## 2012-04-04 MED ORDER — MAGNESIUM HYDROXIDE 400 MG/5ML PO SUSP: 30.0000 mL | Freq: Every day | ORAL | Status: DC | PRN

## 2012-04-04 NOTE — ED Notes (Signed)
Attempted to call report to [facility], they are in shift change and will call [facility] back when done for report.

## 2012-04-04 NOTE — ED Provider Notes (Signed)
Pt accepted to [facility], will transfer stable.   Laray Anger, DO 04/04/12 1551
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
## 2012-04-04 NOTE — ED Provider Notes (Signed)
Medical screening examination/treatment/procedure(s) were performed by non-physician practitioner and as supervising physician I was immediately available for consultation/collaboration.   Renato Spellman, MD 04/04/12 1602 

## 2012-04-04 NOTE — ED Notes (Signed)
BH called back for report, spoke with Angela Cox, RN at [facility].  BH requesting to have pt at [facility] at 1430

## 2012-04-05 DIAGNOSIS — F251 Schizoaffective disorder, depressive type: Secondary | ICD-10-CM | POA: Diagnosis present

## 2012-04-05 MED ORDER — SERTRALINE HCL 50 MG PO TABS
50.0000 mg | ORAL_TABLET | Freq: Every day | ORAL | Status: DC
  Administered 2012-04-06 – 2012-04-08 (×3): 50 mg via ORAL
  Filled 2012-04-05 (×6): qty 1

## 2012-04-05 MED ORDER — RISPERIDONE MICROSPHERES 50 MG IM SUSR
50.0000 mg | INTRAMUSCULAR | Status: DC
  Administered 2012-04-06: 50 mg via INTRAMUSCULAR
  Filled 2012-04-05: qty 2

## 2012-04-05 MED ORDER — BENZTROPINE MESYLATE 1 MG PO TABS
1.0000 mg | ORAL_TABLET | ORAL | Status: DC
  Administered 2012-04-05 – 2012-04-12 (×14): 1 mg via ORAL
  Filled 2012-04-05: qty 1
  Filled 2012-04-05: qty 6
  Filled 2012-04-05 (×13): qty 1
  Filled 2012-04-05: qty 6
  Filled 2012-04-05 (×3): qty 1

## 2012-04-05 NOTE — Progress Notes (Signed)
[facility] Group Notes:  (Counselor/Nursing/MHT/Case Management/Adjunct)  04/05/2012  2:30  PM  Type of Therapy: Group Therapy   Participation Level: Minimal   Participation Quality: Limited  Affect: Blunted  Cognitive: oriented, alert   Insight: Poor  Engagement in Group: Limited  Modes of Intervention: Clarification, Education, Problem-solving, Socialization, Activity, Encouragement and Support   Summary of Progress/Problems: Pt participated in group by listening and self disclosing.  After a brief check-in, therapist introduced the topic of Feelings Around Relapse.  Therapist guided patients to explore emotions they have related to recovery.  Therapist prompted patients to answer the following questions: Which emotions come before relapse?  Which emotions result after the relapse:  Which emotions are related to their recovery; and Ways to respond to other people's emotional reaction to their relapse and recovery.  Patient was minimally engaged in the process and reported she feels angry at herself for letting herself relapse.  Therapist offered support and encouragement.  Some progress noted.  Intervention effective.         Marni Griffon C 04/05/2012  2:30 PM

## 2012-04-05 NOTE — Treatment Plan (Signed)
Interdisciplinary Treatment Plan Update (Adult) Date: 04/05/2012  Time Reviewed: 11:42 AM  Progress in Treatment: Attending groups: Yes Participating in groups: Yes Taking medication as prescribed: Yes Tolerating medication: Yes Family/Significant othe contact made: still need Patient understands diagnosis: Yes Discussing patient identified problems/goals with staff: Yes Medical problems stabilized or resolved: Yes Denies suicidal/homicidal ideation: Yes Issues/concerns per patient self-inventory: None identified Other: N/A New problem(s) identified: None Identified Reason for Continuation of Hospitalization: Anxiety Depression Medication stabilization Interventions implemented related to continuation of hospitalization: mood stabilization, medication monitoring and adjustment, group therapy and psycho education, safety checks q 15 mins Additional comments: N/A Estimated length of stay:  Discharge Plan: SW is assessing for appropriate referrals.  New goal(s): N/A Review of initial/current patient goals per problem list:  1. Goal(s): Reduce depressive symptoms  Met: No  Target date: by discharge  As evidenced by: Reducing depression from a 10 to a 3 as reported by pt.  2. Goal (s): Eliminate Suicidal Ideation  Met: No  Target date: by discharge  As evidenced by: Eliminate suicidal ideation.  3. Goal(s): Reduce Psychosis  Met: No  Target date: by discharge  As evidenced by: Reduce psychotic symptoms to baseline, as reported by pt.  3. Goal(s): Address Follow-up  Met: No  Target date: by discharge  As evidenced by: Pt needs to contact Psychotherapuetic services [phone] to discharge prior to new referral can be made to [name] team per her request Attendees: Patient: [name]  04/05/2012 11:42 AM  Family:    Physician: Franchot Gallo, MD 04/05/2012 11:42 AM   Nursing:    Case Manager: Clarice Pole, LCASA 04/05/2012 11:42 AM   Counselor: Marni Griffon, LCAS 04/05/2012 11:42 AM   Other: Nestor Ramp, RN 04/05/2012 11:42 AM   Other:    Other:    Other:    Scribe for Treatment Team:  Clarice Pole, LCASA 04/05/2012 11:42 AM

## 2012-04-05 NOTE — Discharge Planning (Signed)
04/05/2012  SW met with [name] in discharge planning group.  SW found [name] to have adequate transportation upon discharge.  SW found Danamarie Minami to need contacts made on their behalf.  SW found Lockie Bothun has current service providers, and they are [employer].  SW will continue to assess for referrals.  Clarice Pole, LCASA 04/05/2012, 5:45 PM

## 2012-04-05 NOTE — Progress Notes (Signed)
Patient ID: [name], female   DOB: 04/22/1971, 41 y.o.   MRN: [id number] D: Pt. Hyper verbal, has many concerns, flight of ideas. "I thought I was on 2 much medication, so I missed one then I took two when I remembered" "I can't be taking that much medication because it cost to much and I'm on a budget" "then it was making me eat to much and I was gaining weight." "I wanted to go see the doctor instead of them (pt referring to the PSI)  Coming to see me." "they wanted to schedule to come see me early but that wasn't a good time for me." Pt. Reports she hear babies and voices of children, "hearing it in my head, you know I think children are outside but they are not there." A: Staff will monitor q47min for safety. Writer reviewed meds and administration times. R: Pt. Remains safe on the unit, Pt. Agrees to take meds as ordered.

## 2012-04-05 NOTE — Progress Notes (Signed)
[facility] Adult Inpatient Family/Significant Other Suicide Prevention Education  Suicide Prevention Education:  Contact Attempts: Merton Border (father) [phone] or Brayton Caves (mother) [phone] has been identified by the patient as the family member/significant other with whom the patient will be residing, and identified as the person(s) who will aid the patient in the event of a mental health crisis.  With written consent from the patient, two attempts were made to provide suicide prevention education, prior to and/or following the patient's discharge.  We were unsuccessful in providing suicide prevention education.  A suicide education pamphlet was given to the patient to share with family/significant other.  Date and time of first attempt:04/05/12 4:24 PM  received a message that a better time to catch Merton Border would be sat mid afternoon. therapist agreed to call back then.  [facility] 04/05/2012, 4:23 PM

## 2012-04-05 NOTE — Progress Notes (Signed)
Psychoeducational Group Note  Date:  04/05/2012 Time: 2000  Group Topic/Focus:  Goals Group:   The focus of this group is to help patients establish daily goals to achieve during treatment and discuss how the patient can incorporate goal setting into their daily lives to aide in recovery.  Participation Level:  Active  Participation Quality:  Sharing  Affect:  Depressed and Irritable  Cognitive:  Appropriate  Insight:  Limited  Engagement in Group:  Limited  Additional Comments:  Patient was encouraged to share despite her present mood.    Rodman Pickle R 04/05/2012, 10:09 PM
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
## 2012-04-05 NOTE — Progress Notes (Signed)
04/05/2012  Time: 0930   Group Topic/Focus: The focus of the group is on enhancing the patients' ability to cope with stressors by understanding what coping is, why it is important, the negative effects of stress and developing healthier coping skills. Patients practice [place] and discuss how exercise can be used as a healthy coping strategy.   Participation Level:  Minimal  Participation Quality:  Inattentive  Affect:  Blunted  Cognitive:  Alert   Additional Comments: Patient sat in the back of the room, spent the entire group writing furisously in her workbook.   Tyria Springer  04/05/2012 10:16 AM

## 2012-04-05 NOTE — Progress Notes (Signed)
7a-7p-D-Patient appears depressed today. She rates Depression/Anxiety at 7. Hopelessness is 6. Very sad affect when speaking with the treatment team. Reports still hearing the voice "of children". Slept fair. Ability to pay attention and appetite are improving.  A-Patient is very pleasant and receptive to emotional support. Denies SI. Talked in treatment team about wanting to get set up with a new ACT team. Has been on risperdal consta injections. She reports feeling that prolixin actually has worked better for her in the past. MD is considering adding on oral prolixin to medication regimen.  R-Patient visible on the unit. Is attending the scheduled groups. Remains safe on every fifteen minute safety checks.

## 2012-04-06 NOTE — Progress Notes (Signed)
  [name] is a 41 y.o. female [id number] 03-Jul-1971  04/04/2012 Principal Problem:  *Schizoaffective disorder, depressive type   Mental Status: Mood is better denies active SI/HI/AVH.  Subjective/Objective: Came to hospital after feeling nauseated to check Depakote level. Has also been feeling paranoid but knows she is anticipating school starting and ios afraid she may not be able to perform and will have even more loans to pay back.    Filed Vitals:   04/06/12 0701  BP: 133/97  Pulse: 90  Temp:   Resp:     Lab Results:   BMET    Component Value Date/Time   NA 140 04/03/2012 1409   K 3.7 04/03/2012 1409   CL 104 04/03/2012 1409   CO2 25 04/03/2012 1409   GLUCOSE 90 04/03/2012 1409   BUN 12 04/03/2012 1409   CREATININE 0.76 04/03/2012 1409   CALCIUM 9.4 04/03/2012 1409   GFRNONAA >90 04/03/2012 1409   GFRAA >90 04/03/2012 1409    Medications:  Scheduled:     . benztropine  1 mg Oral BH-qamhs  . divalproex  1,500 mg Oral QHS  . risperiDONE  2 mg Oral QHS  . risperiDONE microspheres  50 mg Intramuscular Q14 Days  . sertraline  50 mg Oral Daily  . DISCONTD: benztropine  1 mg Oral Daily     PRN Meds alum & mag hydroxide-simeth, aspirin, magnesium hydroxide, traZODone, DISCONTD: acetaminophen  Plan: continue current plan of care  Ashanti Ratti,MICKIE D. 04/06/2012

## 2012-04-06 NOTE — Progress Notes (Signed)
Psychoeducational Group Note  Date:  04/06/2012 Time:1000am  Group Topic/Focus:  Identifying Needs:   The focus of this group is to help patients identify their personal needs that have been historically problematic and identify healthy behaviors to address their needs.  Participation Level:  Active  Participation Quality:  Appropriate  Affect:  Blunted  Cognitive:  Appropriate  Insight:  Good  Engagement in Group:  Good  Additional Comments:    Valente David 04/06/2012,11:00 AM

## 2012-04-06 NOTE — Progress Notes (Signed)
[facility] Adult Inpatient Family/Significant Other Suicide Prevention Education  Suicide Prevention Education:  Education Completed;  [name] or work # [phone], extension [phone]-has been identified by the patient as the family member/significant other with whom the patient will be residing, and identified as the person(s) who will aid the patient in the event of a mental health crisis (suicidal ideations/suicide attempt).  With written consent from the patient, the family member/significant other has been provided the following suicide prevention education, prior to the and/or following the discharge of the patient.  The suicide prevention education provided includes the following:  Suicide risk factors  Suicide prevention and interventions  National Suicide Hotline telephone number  [facility] assessment telephone number  [facility] Emergency Assistance 911  [facility] and/or Residential Mobile Crisis Unit telephone number  Request made of family/significant other to:  Remove weapons (e.g., guns, rifles, knives), all items previously/currently identified as safety concern.   Pt.'s mother states that the pt. Has no guns or weapons. Pt. Moved form her mother's home about 6 months ago.   Remove drugs/medications (over-the-counter, prescriptions, illicit drugs), all items previously/currently identified as a safety concern. Pt.'s mother states that pt. Was not taking her medications as she should which led to her coming to [facility].  Pt.'s mother states that the pt. Has had prior problems in the past with HI towards other. Pt.'s mother report no prior SI attempts.  The family member/significant other verbalizes understanding of the suicide prevention education information provided.  The family member/significant other agrees to remove the items of safety concern listed above.  [name] 04/06/2012, 2:58 PM

## 2012-04-06 NOTE — Progress Notes (Signed)
[facility] Adult Inpatient Family/Significant Other Suicide Prevention Education  Suicide Prevention Education:  Education Completed; [id number] has been identified by the patient as the family member/significant other with whom the patient will be residing, and identified as the person(s) who will aid the patient in the event of a mental health crisis (suicidal ideations/suicide attempt).  With written consent from the patient, the family member/significant other has been provided the following suicide prevention education, prior to the and/or following the discharge of the patient.  The suicide prevention education provided includes the following:-  Suicide risk factors  Suicide prevention and interventions  National Suicide Hotline telephone number  [facility] assessment telephone number  [facility] Emergency Assistance 911  [facility] and/or Residential Mobile Crisis Unit telephone number  Request made of family/significant other to:  Remove weapons (e.g., guns, rifles, knives), all items previously/currently identified as safety concern.   Pt.'s father states that pt. Does not have access as far as his knowledge but that the pt. Does not live with him( lives on her own) and gave the home number for pt.'s mother [phone]. This number was called but had busy signal. Jessie's work number [phone] extension [phone] is closed today(04/06/12) and unable to leave a message for mother at work or home number. Will try to call home number again.  Remove drugs/medications (over-the-counter, prescriptions, illicit drugs), all items previously/currently identified as a safety concern. Pt.'s father had no concerns but does not live with the pt.  Pt.'s father states the pt. Has had past problems with SI thoughts but not SI attempts. Pt. Father had questions about pt.'s d/c date and was told that  It is up to the doctor when the pt. Is ready to leave.  The family  member/significant other verbalizes understanding of the suicide prevention education information provided.  The family member/significant other agrees to remove the items of safety concern listed above.  Lamar Blinks [place] 04/06/2012, 12:08 PM

## 2012-04-06 NOTE — Progress Notes (Signed)
Met with pt 1:1 at start of shift. She reports mood is good with minimal depression. Affect flat.  Does still endorse AH in the form of whispers but is unable to make out the content. States prior to admit she was hearing children's voices "like a memory" but that has stopped. She denies any SI/HI/VH. Did show some mild confusion/disorganization when she stated, "I'd like to cut back on my sleep so I can make up for it at Christmas time. You know, sleep in at Christmas." Also states she has had anxiety all of her life and remembers it as a baby. Denies any pain or problems at this time. Requesting trazadone for sleep which was given without difficulty. Support, encouragement offered. Fall precautions reviewed. Pt attended group. Is pleasant and interacting appropriately. Lawrence Marseilles

## 2012-04-06 NOTE — Progress Notes (Signed)
Patient ID: [name], female   DOB: November 21, 1970, 41 y.o.   MRN: [id number] 04-06-12 @ 1235: D: pt has voiced no complaints today. She has had no complaints of pain and still remains paranoid and isolative. A: rn has attempted to develop a therapeutic relationship with this pt establishing trust. R: she has been somewhat receptive but remains isolative and continues having minimal interactive with others in the milieu.

## 2012-04-06 NOTE — Progress Notes (Signed)
Patient ID: [name], female   DOB: 12/20/1970, 41 y.o.   MRN: [id number]   D:Patient lying in bed on approach with eyes closed. Respirations even and non-labored.  A: Staff will monitor on q 15 minute checks during the night R: No response from patient at this time.

## 2012-04-06 NOTE — Progress Notes (Signed)
Patient ID: [name], female   DOB: May 25, 1971, 41 y.o.   MRN: [id number]   [facility] Group Notes:  (Counselor/Nursing/MHT/Case Management/Adjunct)  04/06/2012 11 AM  Type of Therapy:  Aftercare Planning, Group Therapy, Dance/Movement Therapy   Participation Level:  Active  Participation Quality:  Appropriate  Affect:  Appropriate  Cognitive:  Appropriate  Insight:  Good  Engagement in Group:  Good  Engagement in Therapy:  Good  Modes of Intervention:  Clarification, Problem-solving, Role-play, Socialization and Support  Summary of Progress/Problems: After Care: Pt. attended and participated in aftercare planning group. Pt. verbally accepted information on suicide prevention, warning signs to look for with suicide and crisis line numbers to use. Pt was very concerned about [address]. Pt wants to find another apartment in [place] or [address].  Counseling:  Therapist and group members discussed positive and negative thinking. Group members shared their stressors and worries and therapist invited each group member to share one thing they are thankful for in their lives. Pt. shared that she is thankful for her family.   Cassidi Long 04/06/2012. 12:12 PM

## 2012-04-07 LAB — COMPREHENSIVE METABOLIC PANEL
ALT: 9 U/L (ref 0–35)
AST: 10 U/L (ref 0–37)
Alkaline Phosphatase: 55 U/L (ref 39–117)
CO2: 26 mEq/L (ref 19–32)
Calcium: 9.5 mg/dL (ref 8.4–10.5)
GFR calc Af Amer: >90 mL/min (ref >90)
Glucose, Bld: 92 mg/dL (ref 70–99)
Potassium: 4.1 mEq/L (ref 3.5–5.1)
Sodium: 139 mEq/L (ref 135–145)
Total Protein: 7.1 g/dL (ref 6.0–8.3)

## 2012-04-07 LAB — VALPROIC ACID LEVEL: Valproic Acid Lvl: 64.1 ug/mL (ref 50.0–100.0)

## 2012-04-07 NOTE — Progress Notes (Signed)
[facility] Group Notes:  (Counselor/Nursing/MHT/Case Management/Adjunct)  04/07/2012 4:33 AM  Type of Therapy:  wrap-up  Participation Level:  Active  Participation Quality:  Appropriate  Affect:  Appropriate  Cognitive:  Appropriate  Insight:  Good  Engagement in Group:  Good  Engagement in Therapy:  Good  Modes of Intervention:  Education  Summary of Progress/Problems:   [name] The patient attended and participated in group tonight. She reported that she had a good day today. She attended her groups and sleep. She reports that she cope by listening, reading, being obedient 04/07/2012, 4:33 AM

## 2012-04-07 NOTE — Progress Notes (Signed)
Patient ID: [name], female   DOB: Nov 17, 1970, 41 y.o.   MRN: [id number] 04-07-12 nursing shift note: D: pt and been up and visible in the dayroom, she has gone to groups, meals and came to med window for medications. A: rn continues to support and encourage her. R: she has been smiling and talkative. rn will monitor and q 15 min cks continue. Pt remains safe.

## 2012-04-07 NOTE — Progress Notes (Signed)
[facility] Group Notes:  (Counselor/Nursing/MHT/Case Management/Adjunct)  04/07/2012 10:19 PM  Type of Therapy:  wrap-up  Participation Level:  Active  Participation Quality:  Appropriate  Affect:  Appropriate  Cognitive:  Appropriate  Insight:  Good  Engagement in Group:  Good  Engagement in Therapy:  Good  Modes of Intervention:  Education  Summary of Progress/Problems:   [name] Patient attended and participated in group tonight. She reports that she slept, eat and attended one group. She slept most of the day. Pt advised that her support system was her family, friends, co-workers, church and her children.  She plans to seek services through "[facility]" whenever she gets discharged 04/07/2012, 10:19 PM

## 2012-04-07 NOTE — Progress Notes (Signed)
Psychoeducational Group Note  Date:  04/07/2012 Time:  0945 am  Group Topic/Focus:  Making Healthy Choices:   The focus of this group is to help patients identify negative/unhealthy choices they were using prior to admission and identify positive/healthier coping strategies to replace them upon discharge.  Participation Level:  Active  Participation Quality:  Appropriate  Affect:  Appropriate  Cognitive:  Alert  Insight:  Good  Engagement in Group:  Good  Additional Comments:   Charizma Gardiner J 04/07/2012, 10:29 AM  

## 2012-04-07 NOTE — Progress Notes (Signed)
  [name] is a 41 y.o. female [id number] [birth year]  04/04/2012 Principal Problem:  *Schizoaffective disorder, depressive type   Mental Status:  Mood is better. Denies SI/HI/AVH.  Subjective/Objective: Dressed up in dayroom. Focused on getting her financial aid for school and living expenses.    Filed Vitals:   04/07/12 0701  BP: 121/88  Pulse: 92  Temp:   Resp:     Lab Results:   BMET    Component Value Date/Time   NA 140 04/03/2012 1409   K 3.7 04/03/2012 1409   CL 104 04/03/2012 1409   CO2 25 04/03/2012 1409   GLUCOSE 90 04/03/2012 1409   BUN 12 04/03/2012 1409   CREATININE 0.76 04/03/2012 1409   CALCIUM 9.4 04/03/2012 1409   GFRNONAA >90 04/03/2012 1409   GFRAA >90 04/03/2012 1409    Medications:  Scheduled:     . benztropine  1 mg Oral BH-qamhs  . divalproex  1,500 mg Oral QHS  . risperiDONE  2 mg Oral QHS  . risperiDONE microspheres  50 mg Intramuscular Q14 Days  . sertraline  50 mg Oral Daily     PRN Meds alum & mag hydroxide-simeth, aspirin, magnesium hydroxide, traZODone  Plan: check Depakote level.  Isolde Skaff,MICKIE D. 04/07/2012

## 2012-04-07 NOTE — Progress Notes (Signed)
[facility] Group Notes:  (Counselor/Nursing/MHT/Case Management/Adjunct)  04/07/2012 11 AM  Type of Therapy:  Aftercare Planning, Group Therapy, Dance/Movement Therapy   Participation Level:  Did Not Attend  Summary of Progress/Problems: pt accepted the daily workbook on healthy support systems.  [facility] 04/07/2012. 11:37 AM

## 2012-04-07 NOTE — Progress Notes (Signed)
Pt in bed resting frequently but did attend evening group. Exhibits some mild confusion or possible disorganization at times. Pt speaking of insurance stating, "I'm still on college insurance but I need to cut back on my hospitalizations when I'm 41 and 46 and then by 51, be done with them." At other times pt shows good insight into discharge needs stating, "I definitely want and need a community services worker." Pt given support, encouragement. Medicated with trazadone with relief per her request. Pt compliant with standing meds. Depakote level was drawn and is in WNL. She continues to endorse AH but denies SI/HI/VH. Lawrence Marseilles

## 2012-04-08 DIAGNOSIS — F259 Schizoaffective disorder, unspecified: Principal | ICD-10-CM

## 2012-04-08 MED ORDER — SERTRALINE HCL 100 MG PO TABS
100.0000 mg | ORAL_TABLET | Freq: Every day | ORAL | Status: DC
  Administered 2012-04-09 – 2012-04-12 (×4): 100 mg via ORAL
  Filled 2012-04-08 (×3): qty 1
  Filled 2012-04-08: qty 3
  Filled 2012-04-08 (×3): qty 1

## 2012-04-08 NOTE — Progress Notes (Signed)
Patient ID: [name], female   DOB: 31-Mar-1971, 41 y.o.   MRN: [id number] D: pt. In dayroom watching TV, but not interacting with others. Pt. Reports depression at "5". Pt. Reports sleeping well on meds but "gonna have to change it when I go back to school and work."  Clinical research associate asked pt about hearing voices and seeing things,Pt. says "haven't heard nothing in a while", "seeing like usual."  A: Pt. Will be monitored for safety q82min. Pt. Encouraged to go to groups. R: Pt. Attended group. Pt. Remains safe on the unit.

## 2012-04-08 NOTE — Progress Notes (Signed)
[facility] Group Notes:  (Counselor/Nursing/MHT/Case Management/Adjunct)  04/08/2012 3:49 PM  Type of Therapy:  Group Therapy  Participation Level:  Active  Participation Quality:  Attentive and Sharing  Affect:  Blunted  Cognitive:  Oriented  Insight:  Limited  Engagement in Group:  Good  Engagement in Therapy:  Good  Modes of Intervention:  Education, Problem-solving and Support  Summary of Progress/Problems: Patient talked about working too much at part time job. 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 She reported that she tends to overdo and needs more balance. She also talked about the stress of school starting and needing to provide extra for the kids.    HartisAram Beecham 04/08/2012, 3:49 PM

## 2012-04-08 NOTE — Progress Notes (Signed)
SW met with pt during treatment team.  Pt presents with disheveled appearance.  Pt rates depression at a 5-6 and anxiety at a 3-4 today.  Pt reports passive SI but contracts for safety on the unit.  Pt denies hearing voices.  Pt states that she lives alone in [place] and has transportation home.  Pt states that she was followed by [facility] but terminated services there.  SW will assess for appropriate referrals.  No further needs voiced by pt at this time.    Reyes Ivan, LCSWA 04/08/2012  11:45 AM

## 2012-04-08 NOTE — Progress Notes (Signed)
[facility] MD Progress Note  04/08/2012 4:24 PM  Diagnosis:  Axis I: Schizoaffective Disorder - Depressed Type.   The patient was seen today and reports the following:   ADL's: Intact.  Sleep: The patient reports to sleeping well without difficulty. Appetite: The patient reports a good appetite today.   Mild>(1-10) >Severe  Hopelessness (1-10): 2-3  Depression (1-10): 5-6  Anxiety (1-10): 3-4   Suicidal Ideation: The patient reports "fleeting" suicidal ideations today but with no plan or intent.  Plan: No  Intent: No  Means: No   Homicidal Ideation: The patient adamantly denies any homicidal ideations today.  Plan: No  Intent: No.  Means: No   General Appearance/Behavior: The patient was friendly and cooperative today with this provider.  Eye Contact: Good.  Speech: Appropriate in rate and volume with no pressuring noted today.  Motor Behavior: wnl.  Level of Consciousness: Alert and Oriented x 3.  Mental Status: Alert and Oriented x 3.  Mood: Moderately Depressed.  Affect: Moderately Constricted.  Anxiety Level: Mild to moderate anxiety reported today.  Thought Process: wnl.  Thought Content: The patient denies any auditory or visual hallucinations today and states she is no longer hearing voices of babies and children. She denies any delusional thinking.  Perception:. wnl.  Judgment: Fair to Good.  Insight: Fair to Good.  Cognition: Oriented to person, place and time.  Sleep:  Number of Hours: 6    Vital Signs:Blood pressure 95/59, pulse 101, temperature 98.1 F (36.7 C), temperature source Oral, resp. rate 20, height 5\' 6"  (1.676 m), weight 104.781 kg (231 lb), last menstrual period 03/25/2012.  Current Medications: Current Facility-Administered Medications  Medication Dose Route Frequency Provider Last Rate Last Dose  . alum & mag hydroxide-simeth (MAALOX/MYLANTA) 200-200-20 MG/5ML suspension 30 mL  30 mL Oral Q4H PRN Verne Spurr, PA-C      . aspirin tablet 650 mg   650 mg Oral Q4H PRN Verne Spurr, PA-C      . benztropine (COGENTIN) tablet 1 mg  1 mg Oral BH-qamhs Curlene Labrum Letta Cargile, MD   1 mg at 04/08/12 0757  . divalproex (DEPAKOTE) DR tablet 1,500 mg  1,500 mg Oral QHS Curlene Labrum Swetha Rayle, MD   1,500 mg at 04/07/12 2212  . magnesium hydroxide (MILK OF MAGNESIA) suspension 30 mL  30 mL Oral Daily PRN Verne Spurr, PA-C      . risperiDONE (RISPERDAL) tablet 2 mg  2 mg Oral QHS Curlene Labrum Shahrukh Pasch, MD   2 mg at 04/07/12 2146  . risperiDONE microspheres (RISPERDAL CONSTA) injection 50 mg  50 mg Intramuscular Q14 Days Curlene Labrum Rabon Scholle, MD   50 mg at 04/06/12 0845  . sertraline (ZOLOFT) tablet 100 mg  100 mg Oral Daily Curlene Labrum Ismaeel Arvelo, MD      . traZODone (DESYREL) tablet 50 mg  50 mg Oral QHS PRN Verne Spurr, PA-C   50 mg at 04/07/12 2146  . DISCONTD: sertraline (ZOLOFT) tablet 50 mg  50 mg Oral Daily Curlene Labrum Latrena Benegas, MD   50 mg at 04/08/12 0757   Lab Results:  Results for orders placed during the hospital encounter of 04/04/12 (from the past 48 hour(s))  VALPROIC ACID LEVEL     Status: Normal   Collection Time   04/07/12  7:52 PM      Component Value Range Comment   Valproic Acid Lvl 64.1  50.0 - 100.0 ug/mL   COMPREHENSIVE METABOLIC PANEL     Status: Abnormal   Collection Time  04/07/12  7:52 PM      Component Value Range Comment   Sodium 139  135 - 145 mEq/L    Potassium 4.1  3.5 - 5.1 mEq/L    Chloride 102  96 - 112 mEq/L    CO2 26  19 - 32 mEq/L    Glucose, Bld 92  70 - 99 mg/dL    BUN 8  6 - 23 mg/dL    Creatinine, Ser 8.65  0.50 - 1.10 mg/dL    Calcium 9.5  8.4 - 78.4 mg/dL    Total Protein 7.1  6.0 - 8.3 g/dL    Albumin 3.5  3.5 - 5.2 g/dL    AST 10  0 - 37 U/L    ALT 9  0 - 35 U/L    Alkaline Phosphatase 55  39 - 117 U/L    Total Bilirubin 0.1 (*) 0.3 - 1.2 mg/dL    GFR calc non Af Amer 83 (*) >90 mL/min    GFR calc Af Amer >90  >90 mL/min    Physical Findings: AIMS: Facial and Oral Movements Muscles of Facial Expression: None,  normal Lips and Perioral Area: None, normal Jaw: None, normal Tongue: None, normal,Extremity Movements Upper (arms, wrists, hands, fingers): None, normal Lower (legs, knees, ankles, toes): None, normal, Trunk Movements Neck, shoulders, hips: None, normal, Overall Severity Severity of abnormal movements (highest score from questions above): None, normal Incapacitation due to abnormal movements: None, normal [name] awareness of abnormal movements (rate only patient's report): No Awareness, Dental Status Current problems with teeth and/or dentures?: No Does patient usually wear dentures?: No  CIWA:  CIWA-Ar Total: 1  COWS:  COWS Total Score: 1   Review of Systems:  Neurological: The patient denies any headaches today. She denies any seizures or dizziness.  G.I.: The patient denies any constipation today or G.I. Upset.  Musculoskeletal: The patient denies any muscle or skeletal difficulties today.   Time was spent today discussing with the patient her current symptoms. The patient reports to sleeping well last night and reports a good appetite. She reports moderate feelings of sadness, anhedonia and depressed mood and reports mild to moderate anxiety symptoms. She reports very "fleeting" suicidal ideations with no plan or intent but denies any homicidal ideations. She denies any current auditory or visual hallucinations and states she no longer hears the voices of babies and children and denies any delusional thinking. The patient denies any medication related side effects or other concerns today.  Treatment Plan Summary:  1. Daily contact with patient to assess and evaluate symptoms and progress in treatment.  2. Medication management  3. The patient will deny suicidal ideations or homicidal ideations for 48 hours prior to discharge and have a depression and anxiety rating of 3 or less. The patient will also deny any auditory or visual hallucinations or delusional thinking.  4. The patient  will deny any symptoms of substance withdrawal at time of discharge.   Plan:  1. Will increase the medication Zoloft to 100 mgs po q am for depression and anxiety.  2. Will continue the medication Depakote ER at 1500 mgs po qhs for mood stabilization.  3. Will continue the medication Risperdal Consta at 50 mgs IM q 14 days for psychosis.  4. Will also continue the medication Risperdal tablets at 2 mgs po qhs for psychosis.  5. Will continue the patient on her non-psychiatric medications as listed above.  6. Laboratory studies reviewed.  7. Will continue to monitor.  Elara Cocke 04/08/2012, 4:24 PM

## 2012-04-08 NOTE — Tx Team (Signed)
Interdisciplinary Treatment Plan Update (Adult)  Date:  04/08/2012  Time Reviewed:  10:34 AM   Progress in Treatment: Attending groups: Yes Participating in groups:  Yes Taking medication as prescribed: Yes Tolerating medication:  Yes Family/Significant othe contact made:  Counselor assessing for appropriate contact Patient understands diagnosis:  Yes Discussing patient identified problems/goals with staff:  Yes Medical problems stabilized or resolved:  Yes Denies suicidal/homicidal ideation: Yes Issues/concerns per patient self-inventory:  None identified Other: N/A  New problem(s) identified: None Identified  Reason for Continuation of Hospitalization: Depression Anxiety  Suicidal Ideation Medication Stabilization   Interventions implemented related to continuation of hospitalization: mood stabilization, medication monitoring and adjustment, group therapy and psycho education, safety checks q 15 mins  Additional comments: N/A  Estimated length of stay: 3-5 days  Discharge Plan: SW is assessing for appropriate referrals.   New goal(s): N/A  Review of initial/current patient goals per problem list:   1. Goal(s): Reduce depressive and anxiety symptoms   Met: No   Target date: by discharge   As evidenced by: Reducing depression and anxiety from a 10 to a 3 as reported by pt.  2. Goal (s): Eliminate Suicidal Ideation   Met: No   Target date: by discharge   As evidenced by: Eliminate suicidal ideation.  3. Goal(s): Reduce Psychosis-Hallucinations   Met: No   Target date: by discharge   As evidenced by: Reduce psychotic symptoms to baseline, as reported by pt.   Attendees: Patient:  [name] 04/08/2012 10:34 AM   Family:     Physician:  Franchot Gallo, MD 04/08/2012 10:34 AM   Nursing:   Joslyn Devon, RN 04/08/2012 10:34 AM   Case Manager:  Reyes Ivan, LCSWA 04/08/2012 10:34 AM   Counselor:  Veto Kemps, MT-BC 04/08/2012 10:34 AM   Other:  Verne Spurr, PA 04/08/2012 10:34 AM   Other:  Quintella Reichert, RN 04/08/2012 11:23 AM   Other:     Other:      Scribe for Treatment Team:   Carmina Miller, 04/08/2012, 10:34 AM

## 2012-04-08 NOTE — Progress Notes (Signed)
Patient has been isolative to her room; she states on her self inventory that she is not suicidal; however during treatment team she stated she has fleeting thoughts.  She does verbally contract for safety on the milieu.  Patient does not attend groups or participate in her treatment.  She does deny SI/HI/AVH.   Monitor for medication management and MD orders.  Collaborate with treatment team for POC for patient.  Safety checks continued every 15 minutes.  Patient is redirectable.  She is calm and cooperative.  She interacts well with staff and her peers on the milieu.  Her behavior is appropriate at this time.

## 2012-04-08 NOTE — Progress Notes (Signed)
Psychoeducational Group Note  Date:  04/08/2012 Time:  1100  Group Topic/Focus:  Self Care:   The focus of this group is to help patients understand the importance of self-care in order to improve or restore emotional, physical, spiritual, interpersonal, and financial health.  Participation Level:  Did Not Attend  Participation Quality:    Affect:    Cognitive:    Insight:    Engagement in Group:    Additional Comments:  none  Marquis Lunch, Kallee Nam 04/08/2012, 5:47 PM

## 2012-04-08 NOTE — Progress Notes (Signed)
04/08/2012         Time: 0930      Group Topic/Focus: The focus of this group is on enhancing the patient's understanding of leisure, barriers to leisure, and the importance of engaging in positive leisure activities upon discharge for improved total health.  Participation Level: Did Not Attend  Participation Quality: Not Applicable  Affect: Not Applicable  Cognitive: Not Applicable   Additional Comments: Patient refused group.  Kadrian Partch 04/08/2012 11:53 AM    

## 2012-04-08 NOTE — Progress Notes (Signed)
Psychoeducational Group Note  Date:  04/08/2012 Time:  2000  Group Topic/Focus:  Wrap-Up Group:   The focus of this group is to help patients review their daily goal of treatment and discuss progress on daily workbooks.  Participation Level: Active  Participation Quality:  Appropriate  Affect:  Appropriate   Cognitive:  Appropriate   Insight:  Good  Engagement in Group:Good  Additional Comments:  Pt. Was present and participated in group [name] Patience 04/08/2012, 8:50 PM

## 2012-04-09 MED ORDER — ENSURE COMPLETE PO LIQD
237.0000 mL | Freq: Every day | ORAL | Status: DC
  Administered 2012-04-09 – 2012-04-11 (×3): 237 mL via ORAL

## 2012-04-09 NOTE — Progress Notes (Signed)
D: Patient denies SI/HI and denies A/V hallucinations and patient stated " i have been sleeping"; reported that her sleep was fair and that it took her while to go to sleep; reports her appetite is good and her energy level is low; ability to pay attention is improving; reports depression at 3/10 and hopelessness at 4/10 and anxiety 4/10; denies any pain at this time; and states that milk or ensure helps her sleep at night A: encourage patient to attend groups; and encouraged to express any concerns or feelings; patient monitored q 15 minutes R: Patient cooperative but spent most of the day in bed even though she was encouraged to attend group; when patient attended group she was assertive and cooperative and interaction was appropriate

## 2012-04-09 NOTE — Discharge Planning (Signed)
Pt was seen this morning during the discharge planning group.  Pt stated she is feeling okay. Upon discharge the pt will return home to a private setting where she lives alone.  Pt stated that she wanted to return to school in the next upcoming weeks.  Pt stated that she admitted to the hospital due to running out of food, and taking medication on a empty stomach so she stopped taking her medication.  She stated that her mother is supportive and will provide transportation home.  Pt rated her depression at a four and her anxiety level at a five.  Pt stated that she had prior outpatient [facility] with [facility].

## 2012-04-09 NOTE — Progress Notes (Signed)
[name] MD Progress Note  04/09/2012 6:07 PM  Diagnosis:  Axis I: Schizoaffective Disorder - Depressed Type.   The patient was seen today and reports the following:   ADL's: Intact.  Sleep: The patient reports to having difficulty initiating sleep. Appetite: The patient reports a good appetite today.   Mild>(1-10) >Severe  Hopelessness (1-10): 3-4  Depression (1-10): 3-4  Anxiety (1-10): 3-4   Suicidal Ideation: The patient denies any suicidal ideations today.  Plan: No  Intent: No  Means: No   Homicidal Ideation: The patient adamantly denies any homicidal ideations today.  Plan: No  Intent: No.  Means: No   General Appearance/Behavior: The patient was friendly and cooperative today with this provider.  Eye Contact: Good.  Speech: Appropriate in rate and volume with no pressuring noted today.  Motor Behavior: wnl.  Level of Consciousness: Alert and Oriented x 3.  Mental Status: Alert and Oriented x 3.  Mood: Mild to moderately Depressed.  Affect: Mild to moderately Constricted.  Anxiety Level: Mild to moderate anxiety reported today.  Thought Process: wnl.  Thought Content: The patient denies any auditory or visual hallucinations "so far today." She denies any delusional thinking.  Perception: wnl.  Judgment: Fair to Good.  Insight: Fair to Good.  Cognition: Oriented to person, place and time.  Sleep:  Number of Hours: 6.75    Vital Signs:Blood pressure 124/81, pulse 78, temperature 97 F (36.1 C), temperature source Oral, resp. rate 20, height 5\' 6"  (1.676 m), weight 104.781 kg (231 lb), last menstrual period 03/25/2012.  Current Medications: Current Facility-Administered Medications  Medication Dose Route Frequency Provider Last Rate Last Dose  . alum & mag hydroxide-simeth (MAALOX/MYLANTA) 200-200-20 MG/5ML suspension 30 mL  30 mL Oral Q4H PRN Verne Spurr, PA-C      . aspirin tablet 650 mg  650 mg Oral Q4H PRN Verne Spurr, PA-C      . benztropine (COGENTIN)  tablet 1 mg  1 mg Oral BH-qamhs Curlene Labrum Giannina Bartolome, MD   1 mg at 04/09/12 0827  . divalproex (DEPAKOTE) DR tablet 1,500 mg  1,500 mg Oral QHS Curlene Labrum Alleigh Mollica, MD   1,500 mg at 04/08/12 2142  . feeding supplement (ENSURE COMPLETE) liquid 237 mL  237 mL Oral QHS Tyla Burgner D Nemesio Castrillon, MD      . magnesium hydroxide (MILK OF MAGNESIA) suspension 30 mL  30 mL Oral Daily PRN Verne Spurr, PA-C      . risperiDONE (RISPERDAL) tablet 2 mg  2 mg Oral QHS Curlene Labrum Tamaira Ciriello, MD   2 mg at 04/08/12 2142  . risperiDONE microspheres (RISPERDAL CONSTA) injection 50 mg  50 mg Intramuscular Q14 Days Curlene Labrum Bronte Sabado, MD   50 mg at 04/06/12 0845  . sertraline (ZOLOFT) tablet 100 mg  100 mg Oral Daily Curlene Labrum Devonne Lalani, MD   100 mg at 04/09/12 0827  . traZODone (DESYREL) tablet 50 mg  50 mg Oral QHS PRN Verne Spurr, PA-C   50 mg at 04/07/12 2146   Lab Results:  Results for orders placed during the hospital encounter of 04/04/12 (from the past 48 hour(s))  VALPROIC ACID LEVEL     Status: Normal   Collection Time   04/07/12  7:52 PM      Component Value Range Comment   Valproic Acid Lvl 64.1  50.0 - 100.0 ug/mL   COMPREHENSIVE METABOLIC PANEL     Status: Abnormal   Collection Time   04/07/12  7:52 PM      Component  Value Range Comment   Sodium 139  135 - 145 mEq/L    Potassium 4.1  3.5 - 5.1 mEq/L    Chloride 102  96 - 112 mEq/L    CO2 26  19 - 32 mEq/L    Glucose, Bld 92  70 - 99 mg/dL    BUN 8  6 - 23 mg/dL    Creatinine, Ser 1.61  0.50 - 1.10 mg/dL    Calcium 9.5  8.4 - 09.6 mg/dL    Total Protein 7.1  6.0 - 8.3 g/dL    Albumin 3.5  3.5 - 5.2 g/dL    AST 10  0 - 37 U/L    ALT 9  0 - 35 U/L    Alkaline Phosphatase 55  39 - 117 U/L    Total Bilirubin 0.1 (*) 0.3 - 1.2 mg/dL    GFR calc non Af Amer 83 (*) >90 mL/min    GFR calc Af Amer >90  >90 mL/min    Physical Findings: AIMS: Facial and Oral Movements Muscles of Facial Expression: None, normal Lips and Perioral Area: None, normal Jaw: None,  normal Tongue: None, normal,Extremity Movements Upper (arms, wrists, hands, fingers): None, normal Lower (legs, knees, ankles, toes): None, normal, Trunk Movements Neck, shoulders, hips: None, normal, Overall Severity Severity of abnormal movements (highest score from questions above): None, normal Incapacitation due to abnormal movements: None, normal [name] awareness of abnormal movements (rate only patient's report): No Awareness, Dental Status Current problems with teeth and/or dentures?: No Does patient usually wear dentures?: No  CIWA:  CIWA-Ar Total: 1  COWS:  COWS Total Score: 1   Review of Systems:  Neurological: The patient denies any headaches today. She denies any seizures or dizziness.  G.I.: The patient denies any constipation today or G.I. Upset.  Musculoskeletal: The patient denies any muscle or skeletal difficulties today.   Time was spent today discussing with the patient her current symptoms. The patient reports to having some difficulty initiating sleep and reports a good appetite.  She reports mild to moderate feelings of sadness, anhedonia and depressed mood and reports mild to moderate anxiety symptoms. She denies any suicidal or homicidal ideations today. She denies any current auditory or visual hallucinations and states she no longer hears the voices of babies and children and denies any delusional thinking. The patient denies any medication related side effects or other concerns today.  She does ask if she can have a can or Ensure at bedtime "to help her sleep."  This will be ordered.  Treatment Plan Summary:  1. Daily contact with patient to assess and evaluate symptoms and progress in treatment.  2. Medication management  3. The patient will deny suicidal ideations or homicidal ideations for 48 hours prior to discharge and have a depression and anxiety rating of 3 or less. The patient will also deny any auditory or visual hallucinations or delusional thinking.   4. The patient will deny any symptoms of substance withdrawal at time of discharge.   Plan:  1. Will continue the medication Zoloft at 100 mgs po q am for depression and anxiety.  2. Will continue the medication Depakote ER at 1500 mgs po qhs for mood stabilization.  3. Will continue the medication Risperdal Consta at 50 mgs IM q 14 days for psychosis.  4. Will continue the medication Risperdal tablets at 2 mgs po qhs for psychosis.  5. Will continue the patient on her non-psychiatric medications as listed above.  6. Will  start Ensure 1 can po qhs at the patient's request for nutritional supplementation. 7. Laboratory studies reviewed.  8. Will continue to monitor.   Albino Bufford 04/09/2012, 6:07 PM

## 2012-04-09 NOTE — Progress Notes (Signed)
D: Patient in room in bed on approach. Patient calm and cooperative during assessment. Patient states she has been enjoying the group therapy.  She states," I am getting back to the basics and learning the fundamentals." Patient rates depression 5/10 and denies SI/HI and denies AVH. A: Offered encouragement and support. Scheduled medications administered per orders. Staff to monitor Q 15 mins for safety.   R: Patient remains safe on the unit. Taking administered medications.

## 2012-04-09 NOTE — Progress Notes (Signed)
[facility] Group Notes:  (Counselor/Nursing/MHT/Case Management/Adjunct)  04/09/2012 2:54 PM  Type of Therapy:  Group Therapy  Participation Level:  Did Not Attend   [name] 04/09/2012, 2:54 PM 

## 2012-04-09 NOTE — Progress Notes (Signed)
Psychoeducational Group Note  Date:  04/09/2012 Time:  1100  Group Topic/Focus:  Recovery Goals:   The focus of this group is to identify appropriate goals for recovery and establish a plan to achieve them.  Participation Level: Did Not Attend  Participation Quality:  Not Applicable  Affect:  Not Applicable  Cognitive:  Not Applicable  Insight:  Not Applicable  Engagement in Group: Not Applicable  Additional Comments:  Pt was asleep and could not attend group.  Ahria Slappey E 04/09/2012, 12:03 PM

## 2012-04-10 NOTE — Progress Notes (Signed)
D: Patient denies SI/HI and A/V hallucinations but states she thinks she sleeps through most of the A/V hallucinations; patient reports that her sleep was fair and that it took her while to initiate sleep; reports that her appetite is improving  and her energy level is low; her ability to pay attention is improving; rates depression as 5/10 and hopelessness as 4/10 and anxiety 4/10; reports a headache at 1/10 but no other complaints at this time A: encouraged patient to attend groups and to express any feelings and/or concerns; offered emotional support; encouraged patient to drink more fluids; monitored q 15 minutes; R: Patient is cooperative and receptive;patient sleeps quite often during the day and only attends some groups

## 2012-04-10 NOTE — Progress Notes (Signed)
04/10/2012         Time: 0930      Group Topic/Focus: The focus of this group is on discussing various aspects of wellness, balancing those aspects and exploring ways to increase the ability to experience wellness.  Participation Level: Minimal  Participation Quality: Drowsy  Affect: Blunted   Cognitive: Alert   Additional Comments: Patient wrapped in a blanket, laying on sofa in dayroom at the beginning of group. Patient reports being very tired, but says she has been trying to stay awake for group. Patient drowsy, but would answer questions asked of her directly.    [name] 04/10/2012 12:43 PM

## 2012-04-10 NOTE — Discharge Planning (Signed)
04/10/2012  SW met with [name] in discharge planning group.  SW found [name] has current service providers, and they are  [facility].  SW found [name] to rate her depression at 5/10 and anxiety at 4-5/10 today.  SW will continue to assess for referrals.   TC to [facility].  Spoke with Court, [name] team member.  Follow-up scheduled with [facility] physician, Dr. Hortencia Pilar.   Clarice Pole, LCASA 04/10/2012, 3:24 PM

## 2012-04-10 NOTE — Progress Notes (Signed)
Did not attend wrap up group

## 2012-04-10 NOTE — Progress Notes (Signed)
Psychoeducational Group Note  Date:  04/10/2012 Time:  1100  Group Topic/Focus:  Personal Choices and Values:   The focus of this group is to help patients assess and explore the importance of values in their lives, how their values affect their decisions, how they express their values and what opposes their expression.  Participation Level:  Did Not Attend  Participation Quality:    Affect:    Cognitive:    Insight:    Engagement in Group:    Additional Comments:  Pt was sleeping, refused to attend.  Isla Pence M 04/10/2012, 11:35 AM

## 2012-04-10 NOTE — Progress Notes (Signed)
[facility] Group Notes:  (Counselor/Nursing/MHT/Case Management/Adjunct)  04/10/2012 2:59 PM  Type of Therapy:  Psychoeducational Skills  Participation Level:  Active  Participation Quality:  Appropriate  Affect:  Blunted  Cognitive:  Oriented  Insight:  Limited  Engagement in Group:  Good  Engagement in Therapy:  Good  Modes of Intervention:  Education and Support  Summary of Progress/Problems: Patient was attentive to speaker from mental health association. She expressed an interests in going to groups and was questioning what they did in the groups.    [name] 04/10/2012, 2:59 PM

## 2012-04-10 NOTE — Progress Notes (Signed)
[facility] MD Progress Note  04/10/2012 8:39 PM  Diagnosis:  Axis I: Schizoaffective Disorder - Depressed Type.   The patient was seen today and reports the following:   ADL's: Intact.  Sleep: The patient reports to continuing to have difficulty initiating sleep but slept 6.75 hours last night.  Appetite: The patient reports a good appetite today.   Mild>(1-10) >Severe  Hopelessness (1-10): 3  Depression (1-10): 3  Anxiety (1-10): 3-4   Suicidal Ideation: The patient reports today that her suicidal ideations are still "coming and going.".  Plan: No  Intent: No  Means: No   Homicidal Ideation: The patient adamantly denies any homicidal ideations today.  Plan: No  Intent: No.  Means: No   General Appearance/Behavior: The patient remained friendly and cooperative today with this provider.  Eye Contact: Good.  Speech: Appropriate in rate and volume with no pressuring noted today.  Motor Behavior: wnl.  Level of Consciousness: Alert and Oriented x 3.  Mental Status: Alert and Oriented x 3.  Mood: Mild to moderately Depressed.  Affect: Mild to moderately Constricted.  Anxiety Level: Mild to moderate anxiety reported today.  Thought Process: wnl.  Thought Content: The patient denies any auditory or visual hallucinations today.  She denies any delusional thinking.  Perception: wnl.  Judgment: Fair to Good.  Insight: Fair to Good.  Cognition: Oriented to person, place and time.  Sleep:  Number of Hours: 6.75    Vital Signs:Blood pressure 117/84, pulse 92, temperature 97.9 F (36.6 C), temperature source Oral, resp. rate 16, height 5\' 6"  (1.676 m), weight 104.781 kg (231 lb), last menstrual period 03/25/2012.  Current Medications: Current Facility-Administered Medications  Medication Dose Route Frequency Provider Last Rate Last Dose  . alum & mag hydroxide-simeth (MAALOX/MYLANTA) 200-200-20 MG/5ML suspension 30 mL  30 mL Oral Q4H PRN Verne Spurr, PA-C      . aspirin tablet 650 mg   650 mg Oral Q4H PRN Verne Spurr, PA-C      . benztropine (COGENTIN) tablet 1 mg  1 mg Oral BH-qamhs Curlene Labrum Atiyana Welte, MD   1 mg at 04/10/12 0751  . divalproex (DEPAKOTE) DR tablet 1,500 mg  1,500 mg Oral QHS Curlene Labrum Luismiguel Lamere, MD   1,500 mg at 04/09/12 2201  . feeding supplement (ENSURE COMPLETE) liquid 237 mL  237 mL Oral QHS Curlene Labrum Shalae Belmonte, MD   237 mL at 04/09/12 2203  . magnesium hydroxide (MILK OF MAGNESIA) suspension 30 mL  30 mL Oral Daily PRN Verne Spurr, PA-C      . risperiDONE (RISPERDAL) tablet 2 mg  2 mg Oral QHS Curlene Labrum Ashleyann Shoun, MD   2 mg at 04/09/12 2200  . risperiDONE microspheres (RISPERDAL CONSTA) injection 50 mg  50 mg Intramuscular Q14 Days Curlene Labrum Ellee Wawrzyniak, MD   50 mg at 04/06/12 0845  . sertraline (ZOLOFT) tablet 100 mg  100 mg Oral Daily Curlene Labrum Adon Gehlhausen, MD   100 mg at 04/10/12 0751  . traZODone (DESYREL) tablet 50 mg  50 mg Oral QHS PRN Verne Spurr, PA-C   50 mg at 04/07/12 2146   Lab Results: No results found for this or any previous visit (from the past 48 hour(s)).  Physical Findings: AIMS: Facial and Oral Movements Muscles of Facial Expression: None, normal Lips and Perioral Area: None, normal Jaw: None, normal Tongue: None, normal,Extremity Movements Upper (arms, wrists, hands, fingers): None, normal Lower (legs, knees, ankles, toes): None, normal, Trunk Movements Neck, shoulders, hips: None, normal, Overall Severity Severity of  abnormal movements (highest score from questions above): None, normal Incapacitation due to abnormal movements: None, normal [name] awareness of abnormal movements (rate only patient's report): No Awareness, Dental Status Current problems with teeth and/or dentures?: No Does patient usually wear dentures?: No  CIWA:  CIWA-Ar Total: 1  COWS:  COWS Total Score: 1   Review of Systems:  Neurological: The patient denies any headaches today. She denies any seizures or dizziness.  G.I.: The patient denies any constipation today  or G.I. Upset.  Musculoskeletal: The patient denies any muscle or skeletal difficulties today.   Time was spent today discussing with the patient her current symptoms. The patient reports to having some ongoing difficulty initiating sleep but reports a good appetite. She reports mild to moderate feelings of sadness, anhedonia and depressed mood and reports mild to moderate anxiety symptoms. She denies any homicidal ideations today but states that her suicidal ideations are "coming and going." She denies any current auditory or visual hallucinations and denies any delusional thinking. The patient denies any medication related side effects or other concerns today.   Treatment Plan Summary:  1. Daily contact with patient to assess and evaluate symptoms and progress in treatment.  2. Medication management  3. The patient will deny suicidal ideations or homicidal ideations for 48 hours prior to discharge and have a depression and anxiety rating of 3 or less. The patient will also deny any auditory or visual hallucinations or delusional thinking.  4. The patient will deny any symptoms of substance withdrawal at time of discharge.   Plan:  1. Will continue the medication Zoloft at 100 mgs po q am for depression and anxiety.  2. Will continue the medication Depakote ER at 1500 mgs po qhs for mood stabilization.  3. Will continue the medication Risperdal Consta at 50 mgs IM q 14 days for psychosis.  Her next injection is due on April 19, 2012. 4. Will continue the medication Risperdal tablets at 2 mgs po qhs for psychosis.  5. Will continue the patient on her non-psychiatric medications as listed above.  6. Will continue Ensure 1 can po qhs at the patient's request for nutritional supplementation.  7. Laboratory studies reviewed.  8. Will continue to monitor.   Kanyon Bunn 04/10/2012, 8:39 PM

## 2012-04-10 NOTE — Progress Notes (Signed)
Currently resting quietly in bed with eyes closed. Respirations are even and unlabored. No acute distress noted. Safety has been maintained with Q15 minute observation. Will continue current POC. 

## 2012-04-11 NOTE — Progress Notes (Signed)
D: Patient denies SI/HI and A/V hallucinations; patient reports that her sleep was fair and that she was able to sleep through the night; reports that her appetite is good and that her energy level is normal and that her ability to pay attention is improving; rates depression and hopelessness at 2/10; no other complaints at this time A: Patient has been encouraged to attend groups; emotional support offered; medication administration/physician orders; monitored every 15 minutes R: Patient is cooperative and appropriate to situation; patient is attending groups; interacts with staff and peers when attending groups but in between times the patient is the bed sleeping; taking and tolerating medications well

## 2012-04-11 NOTE — Progress Notes (Signed)
Psychoeducational Group Note  Date:  04/11/2012 Time:  2000   Group Topic/Focus:  karaoke  Participation Level:  Active  Participation Quality:  Appropriate  Affect:  Appropriate  Cognitive:  Alert  Insight:  Good  Engagement in Group:  Good  Additional Comments:  Pt attended group this evening. Asked if she would sing a song, just laughed.  Kaleen Odea R 04/11/2012, 8:40 PM

## 2012-04-11 NOTE — Discharge Planning (Signed)
04/11/2012  SW met with [name] in discharge planning group.  SW found Tekeshia Klahr to need bus pass as transportation home upon discharge.  SW informed Aretha she will be seen at home on Monday by ACT Team physician.  SW found Birdie Fetty has current service providers, and they are [facility].  SW will continue to assess for referrals.  Clarice Pole, LCASA 04/11/2012, 3:19 PM

## 2012-04-11 NOTE — Progress Notes (Signed)
[facility] Group Notes:  (Counselor/Nursing/MHT/Case Management/Adjunct)  04/11/2012 3:32 PM  Type of Therapy:  Group Therapy 9:30  Music Therapy 1:15.  Participation Level:  Active  Participation Quality:  Attentive and Sharing  Affect:  Blunted  Cognitive:  Oriented  Insight:  Limited  Engagement in Group:  Good  Engagement in Therapy:  Good  Modes of Intervention:  Clarification, Education and Support  Summary of Progress/Problems: Patient related to a peer as they discussed losses. She talked about how she had gotten over family losses which included: getting busy and distracting herself, taking her medications, and eating properly. She talked about being homeless at one time and having to stay in a shelter. Counselor was able to use patient as an example for the group since she is now living independently in an apartment. Participated in music therapy activities and expressed her feelings and thoughts related to music.   HartisAram [name] 04/11/2012, 3:32 PM

## 2012-04-11 NOTE — Progress Notes (Signed)
[facility] MD Progress Note  04/11/2012 5:51 PM  Diagnosis:  Axis I: Schizoaffective Disorder - Depressed Type.   The patient was seen today and reports the following:   ADL's: Intact.  Sleep: The patient reports to sleeping well last night.  Appetite: The patient reports a good appetite today.   Mild>(1-10) >Severe  Hopelessness (1-10): 3  Depression (1-10): 5  Anxiety (1-10): 4   Suicidal Ideation: The patient denies any current suicidal ideations today.  Plan: No  Intent: No  Means: No   Homicidal Ideation: The patient adamantly denies any homicidal ideations today.  Plan: No  Intent: No.  Means: No   General Appearance/Behavior: The patient remained friendly and cooperative today with this provider.  Eye Contact: Good.  Speech: Appropriate in rate and volume with no pressuring noted today.  Motor Behavior: wnl.  Level of Consciousness: Alert and Oriented x 3.  Mental Status: Alert and Oriented x 3.  Mood: Moderately Depressed.  Affect: Mild to moderately constricted.  Anxiety Level: Mild to moderate anxiety reported today.  Thought Process: wnl.  Thought Content: The patient denies any auditory or visual hallucinations today. She denies any delusional thinking.  Perception: wnl.  Judgment: Fair to Good.  Insight: Fair to Good.  Cognition: Oriented to person, place and time.  Sleep:  Number of Hours: 6.75    Vital Signs:Blood pressure 101/68, pulse 69, temperature 97.5 F (36.4 C), temperature source Oral, resp. rate 16, height 5\' 6"  (1.676 m), weight 104.781 kg (231 lb), last menstrual period 03/25/2012.  Current Medications: Current Facility-Administered Medications  Medication Dose Route Frequency Provider Last Rate Last Dose  . alum & mag hydroxide-simeth (MAALOX/MYLANTA) 200-200-20 MG/5ML suspension 30 mL  30 mL Oral Q4H PRN Verne Spurr, PA-C      . aspirin tablet 650 mg  650 mg Oral Q4H PRN Verne Spurr, PA-C      . benztropine (COGENTIN) tablet 1 mg  1 mg  Oral BH-qamhs Curlene Labrum Yitzchok Carriger, MD   1 mg at 04/11/12 0815  . divalproex (DEPAKOTE) DR tablet 1,500 mg  1,500 mg Oral QHS Curlene Labrum Deliana Avalos, MD   1,500 mg at 04/10/12 2207  . feeding supplement (ENSURE COMPLETE) liquid 237 mL  237 mL Oral QHS Curlene Labrum Najee Cowens, MD   237 mL at 04/10/12 2205  . magnesium hydroxide (MILK OF MAGNESIA) suspension 30 mL  30 mL Oral Daily PRN Verne Spurr, PA-C      . risperiDONE (RISPERDAL) tablet 2 mg  2 mg Oral QHS Curlene Labrum Sloane Palmer, MD   2 mg at 04/10/12 2205  . risperiDONE microspheres (RISPERDAL CONSTA) injection 50 mg  50 mg Intramuscular Q14 Days Curlene Labrum Eisa Necaise, MD   50 mg at 04/06/12 0845  . sertraline (ZOLOFT) tablet 100 mg  100 mg Oral Daily Curlene Labrum Pranay Hilbun, MD   100 mg at 04/11/12 0815  . traZODone (DESYREL) tablet 50 mg  50 mg Oral QHS PRN Verne Spurr, PA-C   50 mg at 04/07/12 2146   Lab Results: No results found for this or any previous visit (from the past 48 hour(s)).  Physical Findings: AIMS: Facial and Oral Movements Muscles of Facial Expression: None, normal Lips and Perioral Area: None, normal Jaw: None, normal Tongue: None, normal,Extremity Movements Upper (arms, wrists, hands, fingers): None, normal Lower (legs, knees, ankles, toes): None, normal, Trunk Movements Neck, shoulders, hips: None, normal, Overall Severity Severity of abnormal movements (highest score from questions above): None, normal Incapacitation due to abnormal movements: None, normal  [name] awareness of abnormal movements (rate only patient's report): No Awareness, Dental Status Current problems with teeth and/or dentures?: No Does patient usually wear dentures?: No  CIWA:  CIWA-Ar Total: 1  COWS:  COWS Total Score: 1   Review of Systems:  Neurological: The patient denies any headaches today. She denies any seizures or dizziness.  G.I.: The patient denies any constipation today or G.I. Upset.  Musculoskeletal: The patient denies any muscle or skeletal difficulties  today.   Time was spent today discussing with the patient her current symptoms. The patient reports to sleeping reasonably well last night and reports a good appetite. She reports moderate feelings of sadness, anhedonia and depressed mood today and reports mild to moderate anxiety symptoms. She denies any current suicidal or homicidal ideations today and denies any current auditory or visual hallucinations or delusional thinking. The patient denies any medication related side effects or other concerns today.   Treatment Plan Summary:  1. Daily contact with patient to assess and evaluate symptoms and progress in treatment.  2. Medication management  3. The patient will deny suicidal ideations or homicidal ideations for 48 hours prior to discharge and have a depression and anxiety rating of 3 or less. The patient will also deny any auditory or visual hallucinations or delusional thinking.  4. The patient will deny any symptoms of substance withdrawal at time of discharge.   Plan:  1. Will continue the medication Zoloft at 100 mgs po q am for depression and anxiety.  2. Will continue the medication Depakote ER at 1500 mgs po qhs for mood stabilization.  3. Will continue the medication Risperdal Consta at 50 mgs IM q 14 days for psychosis. Her next injection is due on April 19, 2012.  4. Will continue the medication Risperdal tablets at 2 mgs po qhs for psychosis.  5. Will continue the patient on her non-psychiatric medications as listed above.  6. Will continue Ensure 1 can po qhs at the patient's request for nutritional supplementation.  7. Laboratory studies reviewed.  8. Will continue to monitor.   [name] 04/11/2012, 5:51 PM

## 2012-04-11 NOTE — Progress Notes (Signed)
D: pt attended karaoki  & has been brightening on approach. Takes medications as prescribed.Pt. Isolates to her room when not attending group.A:supported & encouraged. Continues on 15 minute checks. R: pt. Safety maintained.

## 2012-04-11 NOTE — Progress Notes (Signed)
Psychoeducational Group Note  Date:  04/11/2012 Time:  1100  Group Topic/Focus:  Self Esteem Action Plan:   The focus of this group is to help patients create a plan to continue to build self-esteem after discharge.  Participation Level:  Did Not Attend  Participation Quality:    Affect:    Cognitive:    Insight:    Engagement in Group:    Additional Comments:  Pt been sleep all morning long, pt refused to attend.  Isla Pence M 04/11/2012, 12:18 PM

## 2012-04-12 MED ORDER — BENZTROPINE MESYLATE 1 MG PO TABS: 1.0000 mg | ORAL_TABLET | ORAL | Status: DC

## 2012-04-12 MED ORDER — RISPERIDONE MICROSPHERES 50 MG IM SUSR: 50.0000 mg | INTRAMUSCULAR | Status: DC

## 2012-04-12 MED ORDER — SERTRALINE HCL 100 MG PO TABS: 100.0000 mg | ORAL_TABLET | Freq: Every day | ORAL | Status: DC

## 2012-04-12 MED ORDER — RISPERIDONE 2 MG PO TABS: 2.0000 mg | ORAL_TABLET | Freq: Every day | ORAL | Status: DC

## 2012-04-12 MED ORDER — TRAZODONE HCL 50 MG PO TABS: 50.0000 mg | ORAL_TABLET | Freq: Every evening | ORAL | Status: DC | PRN

## 2012-04-12 MED ORDER — DIVALPROEX SODIUM 500 MG PO DR TAB: 1500.0000 mg | DELAYED_RELEASE_TABLET | Freq: Every day | ORAL | Status: DC

## 2012-04-12 NOTE — Progress Notes (Signed)
04/12/2012         Time: 0930      Group Topic/Focus: The focus of the group is on enhancing the patients' ability to cope with stressors by understanding what coping is, why it is important, the negative effects of stress and developing healthier coping skills. Patients practice [place] and discuss how exercise can be used as a healthy coping strategy.  Participation Level: Minimal  Participation Quality: Drowsy  Affect: Blunted  Cognitive: Oriented   Additional Comments: Patient came to group, wrapped in a blanket, and appeared to be asleep. Patient able to answer questions when called on directly, but otherwise appears asleep.. Patient provided with Friday workbook for unit programming.    Marypat Kimmet 04/12/2012 11:41 AM

## 2012-04-12 NOTE — Progress Notes (Signed)
[facility] Group Notes:  (Counselor/Nursing/MHT/Case Management/Adjunct)  04/12/2012 2:37 PM  Type of Therapy:  Group Therapy  Participation Level:  Active  Participation Quality:  Attentive and Sharing  Affect:  Blunted  Cognitive:  Oriented  Insight:  Limited  Engagement in Group:  Good  Engagement in Therapy:  Good  Modes of Intervention:  Education, Problem-solving and Support  Summary of Progress/Problems:Patient is ready for discharge. She plans to work with her ACT team. Wants to get back to school. Receptive to feedback to look at one school instead of several and work on follow through. Talked about difficulty managing her money.  HartisAram [name] 04/12/2012, 2:37 PM

## 2012-04-12 NOTE — Progress Notes (Signed)
[facility] Case Management Discharge Plan:  Will you be returning to the same living situation after discharge: Yes,  home At discharge, do you have transportation home?:Yes,  ACT team Do you have the ability to pay for your medications:Yes,  ACT team  Interagency Information:     Release of information consent forms completed and in the chart;  [name] signature needed at discharge.  Patient to Follow up at:  Follow-up Information    Follow up with [facility]- Dr. Army Chaco) on 04/15/2012. (Appt with doctor on Monday at 11am.)    Contact information:   [address]  [place], [place] [address] [phone] fax [phone]         Patient denies SI/HI:   Yes,  yes    Safety Planning and Suicide Prevention discussed:  Yes,  yes  Barrier to discharge identified:No.  Summary and Recommendations:   [name] 04/12/2012, 11:37 AM

## 2012-04-12 NOTE — Progress Notes (Signed)
[name] is sen OOB UAL on the [address] hall today...toelrated fair. She makes very brief eye contact. She maintains a flat, depressed, unemotional affect. She interacts appropriately ( for the situraion) and she is cooperative with staff.              A She takes her meds as ordered this morning.She does not complete her self inventory this AM and is encourages twice by this nurse to do this. She does verbally contract for safety with this Clinical research associate.              R Safety is maintaiend andPOC in;cudes DC home later today PD RN [facility]

## 2012-04-12 NOTE — Progress Notes (Signed)
Pt laying in bed resting with eyes closed. Respirations even and unlabored. No distress noted.  
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
## 2012-04-12 NOTE — Progress Notes (Signed)
Pt. Was discharge with all instructions and meds to take with her. Meg from the ACT team came to pick the pt up. Pt appeared in good spirits stating she had chicken and steak in the refrigerator for dinner. She also stated the the nurse and a doctor from the ACt team come to her house to give her any shots that she may need. Pt was given her valuables out of her locker and was walked out to the lobby were she left with the ACT team member. Denies any SI opr HI and contracts for safety.

## 2012-04-15 NOTE — Progress Notes (Signed)
Patient Discharge Instructions:  After Visit Summary (AVS):   Faxed to:  04/15/2012 Psychiatric Admission Assessment Note:   Faxed to:  04/15/2012 Suicide Risk Assessment - Discharge Assessment:   Faxed to:  04/15/2012 Faxed/Sent to the Next Level Care provider:  04/15/2012  Faxed to [name] - Dr. Hortencia Pilar @ [phone]  Wandra Scot, 04/15/2012, 2:50 PM
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
## 2012-05-14 NOTE — Progress Notes (Signed)
This encounter was created in error - please disregard.

## 2012-05-26 ENCOUNTER — Encounter (HOSPITAL_COMMUNITY): Payer: Self-pay | Admitting: *Deleted

## 2012-05-26 ENCOUNTER — Ambulatory Visit (HOSPITAL_COMMUNITY)
Admission: AD | Admit: 2012-05-26 | Discharge: 2012-05-26 | Disposition: A | Discharge status: Home / Self Care | Payer: Medicare Other | Attending: Psychiatry | Admitting: Psychiatry

## 2012-05-26 ENCOUNTER — Emergency Department (HOSPITAL_COMMUNITY)
Admission: EM | Admit: 2012-05-26 | Discharge: 2012-05-26 | Disposition: A | Discharge status: Home / Self Care | Payer: Medicare Other | Attending: Emergency Medicine | Admitting: Emergency Medicine

## 2012-05-26 DIAGNOSIS — I10 Essential (primary) hypertension: Secondary | ICD-10-CM | POA: Insufficient documentation

## 2012-05-26 DIAGNOSIS — F200 Paranoid schizophrenia: Secondary | ICD-10-CM | POA: Insufficient documentation

## 2012-05-26 DIAGNOSIS — Z888 Allergy status to other drugs, medicaments and biological substances status: Secondary | ICD-10-CM | POA: Insufficient documentation

## 2012-05-26 DIAGNOSIS — Z87891 Personal history of nicotine dependence: Secondary | ICD-10-CM | POA: Insufficient documentation

## 2012-05-26 DIAGNOSIS — J45909 Unspecified asthma, uncomplicated: Secondary | ICD-10-CM | POA: Insufficient documentation

## 2012-05-26 DIAGNOSIS — Z91013 Allergy to seafood: Secondary | ICD-10-CM | POA: Insufficient documentation

## 2012-05-26 DIAGNOSIS — J069 Acute upper respiratory infection, unspecified: Secondary | ICD-10-CM | POA: Insufficient documentation

## 2012-05-26 DIAGNOSIS — Z880 Allergy status to penicillin: Secondary | ICD-10-CM | POA: Insufficient documentation

## 2012-05-26 HISTORY — DX: Essential (primary) hypertension: I10

## 2012-05-26 HISTORY — DX: Unspecified asthma, uncomplicated: J45.909

## 2012-05-26 MED ORDER — DIPHENHYDRAMINE HCL 25 MG PO CAPS
25.0000 mg | ORAL_CAPSULE | Freq: Once | ORAL | Status: AC
  Administered 2012-05-26: 25 mg via ORAL
  Filled 2012-05-26: qty 1

## 2012-05-26 MED ORDER — IBUPROFEN 200 MG PO TABS
600.0000 mg | ORAL_TABLET | Freq: Once | ORAL | Status: AC
  Administered 2012-05-26: 600 mg via ORAL
  Filled 2012-05-26: qty 3

## 2012-05-26 MED ORDER — IBUPROFEN 600 MG PO TABS: 600.0000 mg | ORAL_TABLET | Freq: Four times a day (QID) | ORAL | Status: DC | PRN

## 2012-05-26 MED ORDER — BENADRYL 25 MG PO TABS: 25.0000 mg | ORAL_TABLET | Freq: Four times a day (QID) | ORAL | Status: DC | PRN

## 2012-05-26 NOTE — ED Provider Notes (Signed)
Medical screening examination/treatment/procedure(s) were performed by non-physician practitioner and as supervising physician I was immediately available for consultation/collaboration.  Deloyd Handy, MD 05/26/12 1608 

## 2012-05-26 NOTE — ED Notes (Signed)
Pt reports she has been having body aches and nasal congestion.  Pt reports symptoms started two days ago and symptoms have not improved with Mucinex.

## 2012-05-26 NOTE — ED Provider Notes (Signed)
History     CSN: [id number]  Arrival date & time 05/26/12  1344   First MD Initiated Contact with Patient 05/26/12 1456      Chief Complaint  Patient presents with  . Generalized Body Aches  . Nasal Congestion    (Consider location/radiation/quality/duration/timing/severity/associated sxs/prior treatment) Patient is a 41 y.o. female presenting with URI. The history is provided by the patient. No language interpreter was used.  URI The primary symptoms include fatigue. Primary symptoms do not include fever, headaches, sore throat, nausea or vomiting.  Symptoms associated with the illness include plugged ear sensation, congestion and rhinorrhea. The illness is not associated with facial pain or sinus pressure.    Past Medical History  Diagnosis Date  . Anemia   . Asthma   . Hypertension     Past Surgical History  Procedure Date  . Tonsillectomy     History reviewed. No pertinent family history.  History  Substance Use Topics  . Smoking status: Former Games developer  . Smokeless tobacco: Not on file  . Alcohol Use: No    OB History    Grav Para Term Preterm Abortions TAB SAB Ect Mult Living                  Review of Systems  Constitutional: Positive for fatigue. Negative for fever.  HENT: Positive for congestion and rhinorrhea. Negative for sore throat and sinus pressure.   Eyes: Negative.   Respiratory: Negative.   Cardiovascular: Negative.   Gastrointestinal: Negative.  Negative for nausea and vomiting.  Neurological: Negative.  Negative for headaches.  Psychiatric/Behavioral: Negative.   All other systems reviewed and are negative.    Allergies  Haldol; Penicillins; and Shrimp  Home Medications   Current Outpatient Rx  Name Route Sig Dispense Refill  . ASPIRIN EC 81 MG PO TBEC Oral Take 81 mg by mouth every other day.    Marland Kitchen BENZTROPINE MESYLATE 1 MG PO TABS Oral Take 1 mg by mouth at bedtime.    Marland Kitchen DIVALPROEX SODIUM 500 MG PO TBEC Oral Take 1,000 mg by  mouth at bedtime.    Marland Kitchen RISPERIDONE 2 MG PO TABS Oral Take 2 mg by mouth at bedtime.    Marland Kitchen RISPERIDONE MICROSPHERES 50 MG IM SUSR Intramuscular Inject 50 mg into the muscle every 14 (fourteen) days. Next dose is due oct 11th    . SERTRALINE HCL 100 MG PO TABS Oral Take 1 tablet (100 mg total) by mouth daily. For depression.      BP 131/90  Pulse 89  Temp 98.7 F (37.1 C) (Oral)  Resp 18  SpO2 99%  LMP 05/19/2012  Physical Exam  Nursing note and vitals reviewed. Constitutional: She is oriented to person, place, and time. She appears well-developed and well-nourished.  HENT:  Head: Normocephalic and atraumatic.  Eyes: Conjunctivae normal and EOM are normal. Pupils are equal, round, and reactive to light.  Neck: Normal range of motion. Neck supple.  Cardiovascular: Normal rate.   Pulmonary/Chest: Effort normal.  Abdominal: Soft.  Musculoskeletal: Normal range of motion. She exhibits no edema and no tenderness.  Neurological: She is alert and oriented to person, place, and time. She has normal reflexes.  Skin: Skin is warm and dry.  Psychiatric: She has a normal mood and affect.       Denies SI or HI    ED Course  Procedures (including critical care time)  Labs Reviewed - No data to display No results found.   1. URI (  upper respiratory infection)       MDM  Upper respiratory symptoms with rhinorrhea sneezing coughing. No fever tachycardia O2 sats 99 on room air. We'll treat with ibuprofen and Benadryl. In her choice of over-the-counter meds such as Mucinex. No wheezing. No HI or SI. She has missed no doses of her bipolar medication.        Remi Haggard, NP 05/26/12 1527
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
## 2012-06-22 ENCOUNTER — Emergency Department (HOSPITAL_COMMUNITY)
Admission: EM | Admit: 2012-06-22 | Discharge: 2012-06-23 | Disposition: A | Discharge status: Other Acute Inpatient Hospital | Payer: Medicare Other | Source: Home / Self Care | Attending: Emergency Medicine | Admitting: Emergency Medicine

## 2012-06-22 ENCOUNTER — Encounter (HOSPITAL_COMMUNITY): Payer: Self-pay | Admitting: *Deleted

## 2012-06-22 ENCOUNTER — Ambulatory Visit (HOSPITAL_COMMUNITY)
Admission: RE | Admit: 2012-06-22 | Discharge: 2012-06-22 | Disposition: A | Discharge status: Home / Self Care | Payer: Medicare Other | Source: Home / Self Care | Attending: Psychiatry | Admitting: Psychiatry

## 2012-06-22 DIAGNOSIS — Z79899 Other long term (current) drug therapy: Secondary | ICD-10-CM | POA: Insufficient documentation

## 2012-06-22 DIAGNOSIS — F259 Schizoaffective disorder, unspecified: Secondary | ICD-10-CM | POA: Insufficient documentation

## 2012-06-22 DIAGNOSIS — J45909 Unspecified asthma, uncomplicated: Secondary | ICD-10-CM | POA: Insufficient documentation

## 2012-06-22 DIAGNOSIS — F29 Unspecified psychosis not due to a substance or known physiological condition: Secondary | ICD-10-CM

## 2012-06-22 DIAGNOSIS — Z87891 Personal history of nicotine dependence: Secondary | ICD-10-CM | POA: Insufficient documentation

## 2012-06-22 DIAGNOSIS — Z0289 Encounter for other administrative examinations: Secondary | ICD-10-CM | POA: Insufficient documentation

## 2012-06-22 DIAGNOSIS — F209 Schizophrenia, unspecified: Secondary | ICD-10-CM | POA: Insufficient documentation

## 2012-06-22 DIAGNOSIS — F329 Major depressive disorder, single episode, unspecified: Secondary | ICD-10-CM | POA: Insufficient documentation

## 2012-06-22 DIAGNOSIS — I10 Essential (primary) hypertension: Secondary | ICD-10-CM | POA: Insufficient documentation

## 2012-06-22 DIAGNOSIS — F3289 Other specified depressive episodes: Secondary | ICD-10-CM | POA: Insufficient documentation

## 2012-06-22 DIAGNOSIS — D649 Anemia, unspecified: Secondary | ICD-10-CM | POA: Insufficient documentation

## 2012-06-22 DIAGNOSIS — Z3202 Encounter for pregnancy test, result negative: Secondary | ICD-10-CM | POA: Insufficient documentation

## 2012-06-22 DIAGNOSIS — Z7982 Long term (current) use of aspirin: Secondary | ICD-10-CM | POA: Insufficient documentation

## 2012-06-22 HISTORY — DX: Schizophrenia, unspecified: F20.9

## 2012-06-22 LAB — COMPREHENSIVE METABOLIC PANEL
ALT: 10 U/L (ref 0–35)
AST: 15 U/L (ref 0–37)
Albumin: 3.9 g/dL (ref 3.5–5.2)
CO2: 27 mEq/L (ref 19–32)
Calcium: 9.5 mg/dL (ref 8.4–10.5)
Creatinine, Ser: 0.86 mg/dL (ref 0.50–1.10)
GFR calc non Af Amer: 84 mL/min — ABNORMAL LOW (ref >90)
Sodium: 137 mEq/L (ref 135–145)
Total Protein: 7.6 g/dL (ref 6.0–8.3)

## 2012-06-22 LAB — RAPID URINE DRUG SCREEN, HOSP PERFORMED
Amphetamines: NOT DETECTED
Benzodiazepines: NOT DETECTED
Cocaine: NOT DETECTED
Opiates: NOT DETECTED
Tetrahydrocannabinol: NOT DETECTED

## 2012-06-22 LAB — CBC
MCH: 30.3 pg (ref 26.0–34.0)
Platelets: 244 10*3/uL (ref 150–400)
RBC: 4.03 MIL/uL (ref 3.87–5.11)
RDW: 13 % (ref 11.5–15.5)

## 2012-06-22 LAB — SALICYLATE LEVEL: Salicylate Lvl: <2 mg/dL — ABNORMAL LOW (ref 2.8–20.0)

## 2012-06-22 MED ORDER — SERTRALINE HCL 50 MG PO TABS
100.0000 mg | ORAL_TABLET | Freq: Every day | ORAL | Status: DC
  Administered 2012-06-22: 100 mg via ORAL
  Filled 2012-06-22: qty 2

## 2012-06-22 MED ORDER — RISPERIDONE 2 MG PO TABS
2.0000 mg | ORAL_TABLET | Freq: Every day | ORAL | Status: DC
  Administered 2012-06-22: 2 mg via ORAL
  Filled 2012-06-22: qty 1

## 2012-06-22 MED ORDER — LORAZEPAM 1 MG PO TABS: 1.0000 mg | ORAL_TABLET | Freq: Three times a day (TID) | ORAL | Status: DC | PRN

## 2012-06-22 MED ORDER — BENZTROPINE MESYLATE 1 MG PO TABS
1.0000 mg | ORAL_TABLET | Freq: Every day | ORAL | Status: DC
  Administered 2012-06-22: 1 mg via ORAL
  Filled 2012-06-22: qty 1

## 2012-06-22 MED ORDER — DIPHENHYDRAMINE HCL 25 MG PO CAPS: 25.0000 mg | ORAL_CAPSULE | Freq: Four times a day (QID) | ORAL | Status: DC | PRN

## 2012-06-22 MED ORDER — ZOLPIDEM TARTRATE 5 MG PO TABS: 5.0000 mg | ORAL_TABLET | Freq: Every evening | ORAL | Status: DC | PRN

## 2012-06-22 MED ORDER — TRAZODONE HCL 50 MG PO TABS
50.0000 mg | ORAL_TABLET | Freq: Every day | ORAL | Status: DC
  Administered 2012-06-22: 50 mg via ORAL
  Filled 2012-06-22: qty 1

## 2012-06-22 MED ORDER — NICOTINE 21 MG/24HR TD PT24
21.0000 mg | MEDICATED_PATCH | Freq: Every day | TRANSDERMAL | Status: DC
  Administered 2012-06-22: 21 mg via TRANSDERMAL
  Filled 2012-06-22: qty 1

## 2012-06-22 MED ORDER — ONDANSETRON HCL 4 MG PO TABS
4.0000 mg | ORAL_TABLET | Freq: Three times a day (TID) | ORAL | Status: DC | PRN
  Administered 2012-06-23: 4 mg via ORAL
  Filled 2012-06-22: qty 1

## 2012-06-22 MED ORDER — ASPIRIN EC 81 MG PO TBEC
81.0000 mg | DELAYED_RELEASE_TABLET | ORAL | Status: DC
  Administered 2012-06-23: 81 mg via ORAL
  Filled 2012-06-22: qty 1

## 2012-06-22 MED ORDER — IBUPROFEN 600 MG PO TABS
600.0000 mg | ORAL_TABLET | Freq: Four times a day (QID) | ORAL | Status: DC | PRN
  Administered 2012-06-23: 600 mg via ORAL
  Filled 2012-06-22: qty 1

## 2012-06-22 MED ORDER — DIVALPROEX SODIUM 500 MG PO DR TAB
1000.0000 mg | DELAYED_RELEASE_TABLET | Freq: Every day | ORAL | Status: DC
  Administered 2012-06-22: 1000 mg via ORAL
  Filled 2012-06-22: qty 2

## 2012-06-22 NOTE — ED Notes (Signed)
RN in room talking to pt 
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
## 2012-06-22 NOTE — ED Notes (Signed)
MD notified of necessity for orders for patient

## 2012-06-22 NOTE — ED Notes (Signed)
Pt sitting on her bed eating a sandwich

## 2012-06-22 NOTE — ED Provider Notes (Signed)
History     CSN: [id number]  Arrival date & time 06/22/12  1603   First MD Initiated Contact with Patient 06/22/12 1715      Chief Complaint  Patient presents with  . Medical Clearance    (Consider location/radiation/quality/duration/timing/severity/associated sxs/prior treatment) HPI Comments: Pt comes in with cc of medical clearance. Pt reports that she has not been feeling well, and that she has had thoughts of hurting herself. She has not actually attempted to hurt herself - except for some cutting in her wrist few days back. She also admits to some auditory hallucinations She has no sx right now. Pt is denying any substance abuse, and she has missed some of her meds.  The history is provided by the patient.    Past Medical History  Diagnosis Date  . Anemia   . Asthma   . Hypertension   . Schizophrenic disorder     Past Surgical History  Procedure Date  . Tonsillectomy     No family history on file.  History  Substance Use Topics  . Smoking status: Former Games developer  . Smokeless tobacco: Never Used  . Alcohol Use: No    OB History    Grav Para Term Preterm Abortions TAB SAB Ect Mult Living                  Review of Systems  Constitutional: Positive for activity change.  HENT: Negative for neck pain.   Respiratory: Negative for shortness of breath.   Cardiovascular: Negative for chest pain.  Gastrointestinal: Negative for nausea, vomiting and abdominal pain.  Genitourinary: Negative for dysuria.  Neurological: Negative for headaches.  Psychiatric/Behavioral: Positive for suicidal ideas and behavioral problems.    Allergies  Haldol; Penicillins; and Shrimp  Home Medications   Current Outpatient Rx  Name Route Sig Dispense Refill  . ASPIRIN EC 81 MG PO TBEC Oral Take 81 mg by mouth every other day.    Marland Kitchen BENZTROPINE MESYLATE 1 MG PO TABS Oral Take 1 mg by mouth at bedtime.    Marland Kitchen DIPHENHYDRAMINE HCL 25 MG PO TABS Oral Take 25 mg by mouth every 6  (six) hours as needed. itching    . DIVALPROEX SODIUM 500 MG PO TBEC Oral Take 1,000 mg by mouth at bedtime.    . IBUPROFEN 600 MG PO TABS Oral Take 1 tablet (600 mg total) by mouth every 6 (six) hours as needed for pain. 30 tablet 0  . NAPROXEN SODIUM 220 MG PO TABS Oral Take 220 mg by mouth 2 (two) times daily with a meal. pain    . OVER THE COUNTER MEDICATION Oral Take 1 tablet by mouth daily. Sinus relief from [employer]    . RISPERIDONE 2 MG PO TABS Oral Take 2 mg by mouth at bedtime.    Marland Kitchen RISPERIDONE MICROSPHERES 50 MG IM SUSR Intramuscular Inject 50 mg into the muscle every 14 (fourteen) days.     . SERTRALINE HCL 100 MG PO TABS Oral Take 100 mg by mouth at bedtime.    . TRAZODONE HCL 50 MG PO TABS Oral Take 50 mg by mouth at bedtime.      BP 94/64  Pulse 84  Temp 98.9 F (37.2 C) (Oral)  Resp 20  SpO2 100%  LMP 05/19/2012  Physical Exam  Constitutional: She is oriented to person, place, and time. She appears well-developed and well-nourished.  HENT:  Head: Normocephalic and atraumatic.  Eyes: EOM are normal. Pupils are equal, round, and reactive  to light.  Neck: Neck supple.  Cardiovascular: Normal rate, regular rhythm and normal heart sounds.   No murmur heard. Pulmonary/Chest: Effort normal. No respiratory distress.  Abdominal: Soft. She exhibits no distension. There is no tenderness. There is no rebound and no guarding.  Neurological: She is alert and oriented to person, place, and time.  Skin: Skin is warm and dry.    ED Course  Procedures (including critical care time)  Labs Reviewed  COMPREHENSIVE METABOLIC PANEL - Abnormal; Notable for the following:    Glucose, Bld 102 (*)     GFR calc non Af Amer 84 (*)     All other components within normal limits  SALICYLATE LEVEL - Abnormal; Notable for the following:    Salicylate Lvl <2.0 (*)     All other components within normal limits  VALPROIC ACID LEVEL - Abnormal; Notable for the following:    Valproic Acid  Lvl <10.0 (*)     All other components within normal limits  ACETAMINOPHEN LEVEL  CBC  ETHANOL  URINE RAPID DRUG SCREEN (HOSP PERFORMED)  POCT PREGNANCY, URINE   No results found.   1. Psychoses       MDM  DDx: Depression Bipolar disorder Schizophrenia Substance abuse Suicidal ideation Acute withdrawal  Pt comes in with cc of suicidal ideations, psychoses. She appears decompensated, and will need tele psych consultation for likely admission.         Derwood Kaplan, MD 06/22/12 2129

## 2012-06-22 NOTE — ED Notes (Signed)
RN in room talking to pt

## 2012-06-23 ENCOUNTER — Inpatient Hospital Stay (HOSPITAL_COMMUNITY)
Admission: RE | Admit: 2012-06-23 | Discharge: 2012-06-28 | DRG: 885 | Payer: Medicare Other | Source: Ambulatory Visit | Attending: Psychiatry | Admitting: Psychiatry

## 2012-06-23 ENCOUNTER — Encounter (HOSPITAL_COMMUNITY): Payer: Self-pay

## 2012-06-23 DIAGNOSIS — F251 Schizoaffective disorder, depressive type: Secondary | ICD-10-CM

## 2012-06-23 DIAGNOSIS — I10 Essential (primary) hypertension: Secondary | ICD-10-CM | POA: Diagnosis present

## 2012-06-23 DIAGNOSIS — F259 Schizoaffective disorder, unspecified: Principal | ICD-10-CM | POA: Diagnosis present

## 2012-06-23 DIAGNOSIS — Z880 Allergy status to penicillin: Secondary | ICD-10-CM

## 2012-06-23 DIAGNOSIS — Z7982 Long term (current) use of aspirin: Secondary | ICD-10-CM

## 2012-06-23 DIAGNOSIS — R45851 Suicidal ideations: Secondary | ICD-10-CM

## 2012-06-23 DIAGNOSIS — J45909 Unspecified asthma, uncomplicated: Secondary | ICD-10-CM | POA: Diagnosis present

## 2012-06-23 DIAGNOSIS — Z79899 Other long term (current) drug therapy: Secondary | ICD-10-CM

## 2012-06-23 DIAGNOSIS — Z23 Encounter for immunization: Secondary | ICD-10-CM

## 2012-06-23 HISTORY — DX: Anxiety disorder, unspecified: F41.9

## 2012-06-23 HISTORY — DX: Unspecified convulsions: R56.9

## 2012-06-23 HISTORY — DX: Depression, unspecified: F32.A

## 2012-06-23 HISTORY — DX: Insomnia, unspecified: G47.00

## 2012-06-23 HISTORY — DX: Major depressive disorder, single episode, unspecified: F32.9

## 2012-06-23 MED ORDER — ACETAMINOPHEN 325 MG PO TABS
650.0000 mg | ORAL_TABLET | Freq: Four times a day (QID) | ORAL | Status: DC | PRN
  Administered 2012-06-24: 650 mg via ORAL

## 2012-06-23 MED ORDER — MAGNESIUM HYDROXIDE 400 MG/5ML PO SUSP: 30.0000 mL | Freq: Every day | ORAL | Status: DC | PRN

## 2012-06-23 MED ORDER — INFLUENZA VIRUS VACC SPLIT PF IM SUSP
0.5000 mL | INTRAMUSCULAR | Status: AC
  Administered 2012-06-24: 0.5 mL via INTRAMUSCULAR

## 2012-06-23 MED ORDER — SERTRALINE HCL 100 MG PO TABS
100.0000 mg | ORAL_TABLET | Freq: Every day | ORAL | Status: DC
  Administered 2012-06-23 – 2012-06-27 (×5): 100 mg via ORAL
  Filled 2012-06-23 (×8): qty 1

## 2012-06-23 MED ORDER — RISPERIDONE 2 MG PO TABS
2.0000 mg | ORAL_TABLET | Freq: Every day | ORAL | Status: DC
  Administered 2012-06-23 – 2012-06-27 (×5): 2 mg via ORAL
  Filled 2012-06-23 (×8): qty 1

## 2012-06-23 MED ORDER — ASPIRIN EC 81 MG PO TBEC
81.0000 mg | DELAYED_RELEASE_TABLET | ORAL | Status: DC
  Administered 2012-06-25 – 2012-06-27 (×2): 81 mg via ORAL
  Filled 2012-06-23 (×3): qty 1

## 2012-06-23 MED ORDER — TRAZODONE HCL 50 MG PO TABS
50.0000 mg | ORAL_TABLET | Freq: Every day | ORAL | Status: DC
  Administered 2012-06-23 – 2012-06-27 (×5): 50 mg via ORAL
  Filled 2012-06-23 (×8): qty 1

## 2012-06-23 MED ORDER — DIVALPROEX SODIUM 500 MG PO DR TAB
1000.0000 mg | DELAYED_RELEASE_TABLET | Freq: Every day | ORAL | Status: DC
  Administered 2012-06-23 – 2012-06-27 (×5): 1000 mg via ORAL
  Filled 2012-06-23: qty 1
  Filled 2012-06-23 (×6): qty 2
  Filled 2012-06-23: qty 1
  Filled 2012-06-23: qty 2

## 2012-06-23 MED ORDER — BENZTROPINE MESYLATE 1 MG PO TABS
1.0000 mg | ORAL_TABLET | Freq: Every day | ORAL | Status: DC
  Administered 2012-06-23 – 2012-06-27 (×5): 1 mg via ORAL
  Filled 2012-06-23 (×8): qty 1

## 2012-06-23 MED ORDER — ALUM & MAG HYDROXIDE-SIMETH 200-200-20 MG/5ML PO SUSP: 30.0000 mL | ORAL | Status: DC | PRN

## 2012-06-23 NOTE — ED Notes (Signed)
Pt ambulatory to [facility] w/ security and NT 

## 2012-06-23 NOTE — Tx Team (Signed)
Initial Interdisciplinary Treatment Plan  PATIENT STRENGTHS: (choose at least two) Average or above average intelligence General fund of knowledge Motivation for treatment/growth  PATIENT STRESSORS: Financial difficulties Loss of Job* Marital or family conflict   PROBLEM LIST: Problem List/Patient Goals Date to be addressed Date deferred Reason deferred Estimated date of resolution  Suicidal Ideation        Psychosis                                                 DISCHARGE CRITERIA:  Improved stabilization in mood, thinking, and/or behavior  PRELIMINARY DISCHARGE PLAN: Outpatient therapy  PATIENT/FAMIILY INVOLVEMENT: This treatment plan has been presented to and reviewed with the patient, [name], and/or family member.  The patient and family have been given the opportunity to ask questions and make suggestions.  [name] [facility] 06/23/2012, 8:10 PM

## 2012-06-23 NOTE — ED Notes (Signed)
Transport to [facility] at 1900 per [employer]

## 2012-06-23 NOTE — ED Notes (Signed)
Pt laying in her bed watching TV
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
## 2012-06-23 NOTE — ED Provider Notes (Signed)
Patient sleeping on AM rounds.  Per RN, no overnight concerns  Patient accepted to [facility], pending bed availability  Gerhard Munch, MD 06/23/12 [phone]

## 2012-06-23 NOTE — [facility] Counselor (Signed)
Belinda from [facility], who provide [name] services to the Pt, called [facility] assessment office to provide a list of Pt's outpatient medications. Pt is prescribed Depakote ER 1500 mg qhs, Cogentin 1 mg qhs, Risperdal 2 mg qhs and Risperdal Consta 50 mg 2 weeks. Belinda reports Pt has been running away from their staff and has not been taking her medications as prescribed for at least 2 weeks.  Harlin Rain Patsy [place], LPC, NCC

## 2012-06-23 NOTE — BH Assessment (Signed)
[facility] Assessment Progress Note      Called area facilities to inquire about bed availability.  Per Patsy at [facility] there no beds available (2349).  Per Jacob at [facility] there are no beds available (2355).  Left message at [facility] (2358).  Sponsorship beds available for [place] at [facility] per Jackie (0200).  Faxed referral.  

## 2012-06-23 NOTE — ED Notes (Signed)
Up to the bathroom 

## 2012-06-23 NOTE — ED Notes (Signed)
Standing beside her bed taking off her socks getting ready to lay down in bed

## 2012-06-23 NOTE — ED Notes (Signed)
2 bags of belongings sent w/ pt 

## 2012-06-23 NOTE — ED Notes (Signed)
[facility] will call for report

## 2012-06-24 NOTE — Progress Notes (Signed)
Psychoeducational Group Note  Date:  06/24/2012 Time:  2000  Group Topic/Focus:  Wrap-Up Group:   The focus of this group is to help patients review their daily goal of treatment and discuss progress on daily workbooks.  Participation Level:  Active  Participation Quality:  Appropriate  Affect:  Appropriate  Cognitive:  Appropriate  Insight:  Good  Engagement in Group:  Good  Additional Comments:  Patient attended and participated in group tonight. She reports having a good day. She went to groups, eat right. She take care of her wellness by staying clean. She is concern about her face.  Lita Mains [facility] 06/24/2012, 10:12 PM

## 2012-06-24 NOTE — Treatment Plan (Signed)
Interdisciplinary Treatment Plan Update (Adult)  Date: 06/24/2012  Time Reviewed: 9:55 AM   Progress in Treatment: Attending groups: Yes Participating in groups: Yes Taking medication as prescribed: Yes Tolerating medication: Yes   Family/Significant other contact made: Not yet  Patient understands diagnosis:  Yes As evidenced by asking for help getting mentally stabilized Discussing patient identified problems/goals with staff:  Yes See below Medical problems stabilized or resolved:  Yes Denies suicidal/homicidal ideation: Yes  In tx team Issues/concerns per patient self-inventory:  Yes  Rates her depression at a 9 and hopelessness at a 7  C/O withdrawal symptoms  [was not positive for any drugs or alcohol] Other:  New problem(s) identified: N/A  Reason for Continuation of Hospitalization: Hallucinations Medication stabilization  Interventions implemented related to continuation of hospitalization:  Restart meds  Encourage group attendance and participation  Explore placement  Additional comments:  Estimated length of stay: 3-4 days  Discharge Plan: [facility] currently asking for placement  New goal(s): N/A  Review of initial/current patient goals per problem list:   1.  Goal(s):Eliminate SI  Met:  Yes  Target date:10/28  As evidenced by: self report  2.  Goal (s): Eliminate psychosis  Met:  No  Target date:11/1  As evidenced ZO:XWRU report/observation  3.  Goal(s): Establish comprehensive mental wellness plan/possible group home placement  Met:  No  Target date:10/31  As evidenced by: self report  4.  Goal(s):  Met:  No  Target date:  As evidenced by:  Attendees: Patient:  [name] 06/24/2012 9:55AM  Family:     Physician:  [name] 06/24/2012 9:55 AM   Nursing: Garen Lah   06/24/2012 9:55 AM   Clinical Social Worker:  Richelle Ito 06/24/2012 9:55 AM   Extender:  Verne Spurr PA 06/24/2012 9:55 AM   Other:     Other:     Other:      Other:      Scribe for Treatment Team:   Ida Rogue, 06/24/2012 9:55 AM

## 2012-06-24 NOTE — Progress Notes (Signed)
Pt observed in the dayroom watching TV and waiting for group time to begin.  Spoke with pt 1:1 who says she has had a good day, but is now having a mild headache pain to her temples and the back of her head.  She rates this pain a 4/10.  She says she will take some Tylenol with her HS meds after group.  She denies SI/HI/AV.  She says she has attended groups today.  Her thoughts are clear at this time.  Her affect is mildly anxious, but may be d/t the mild headache.  She voices no other needs/concerns.  Pt was encouraged to make her needs known to staff.  Pt voices understanding.  Safety maintained with q15 minute checks.

## 2012-06-24 NOTE — Discharge Planning (Signed)
Harmoni attended both the AM and PM group.  She participated in both in her usual tangential, disjointed, disorganized way.  Mood good.  Denied SI in group.  Asked for a referral to a group home.  "I just got way too happy, and the next thing you know, I ended up here."

## 2012-06-24 NOTE — Care Management Utilization Note (Signed)
Per State Regulation 482.30  This chart was reviewed for medical necessity with respect to the patient's Admission/Duration of stay.   Next review due:  06/27/12   Ambrose Mantle, LCSW  06/24/2012  9:38 AM

## 2012-06-24 NOTE — Progress Notes (Signed)
Patient ID: [name], female   DOB: 05/06/1971, 41 y.o.   MRN: [id number] Patient has disorganized thinking; endorses passive SI.  She contracts for safety while on the milieu.  She has been attending groups and participates when necessary; minimal interaction with staff and peers.  Patient states that she had been taking her medication, however, her depakote level is below what it should be.  She denies any HI/AVH at this time.  She has indicated that she would like to go to a group home upon discharge as she is being evicted from her apartment.  Continue to monitor medication management and MD orders.  Collaborate with treatment team members regarding patient's POC.  Safety checks completed every 15 minutes.  [name] behavior has been appropriate on the milieu.

## 2012-06-24 NOTE — Progress Notes (Signed)
Resting quietly with eyes closed. Respirations even and unlabored. No distress noted.Q 15 minute check continues as ordered to maintain safety. 

## 2012-06-24 NOTE — Progress Notes (Signed)
Psychoeducational Group Note  Date:  06/24/2012 Time:  1100  Group Topic/Focus:  Wellness Toolbox:   The focus of this group is to discuss various aspects of wellness, balancing those aspects and exploring ways to increase the ability to experience wellness.  Patients will create a wellness toolbox for use upon discharge.  Participation Level:  Did Not Attend  Participation Quality:    Affect:    Cognitive:    Insight:    Engagement in Group:    Additional Comments:  none  Marquis Lunch, Marisa Hage 06/24/2012, 5:14 PM

## 2012-06-25 ENCOUNTER — Encounter (HOSPITAL_COMMUNITY): Payer: Self-pay | Admitting: Psychiatry

## 2012-06-25 DIAGNOSIS — F259 Schizoaffective disorder, unspecified: Principal | ICD-10-CM

## 2012-06-25 NOTE — [facility] Suicide Risk Assessment (Deleted)
Suicide Risk Assessment  Discharge Assessment     Demographic Factors:    Mental Status Per Nursing Assessment::   On Admission:  Suicidal ideation indicated by patient  Current Mental Status by Physician: Patient is alert and oriented to time, place and person. She denies suicidal/homicidal ideations, intent or plan.  Loss Factors: NA  Historical Factors: History of suicide ideations with no plan  Risk Reduction Factors:   NA  Continued Clinical Symptoms:  Resolving depression  Cognitive Features That Contribute To Risk:  Closed-mindedness    Suicide Risk:  Minimal: No identifiable suicidal ideation.  Patients presenting with no risk factors but with morbid ruminations; may be classified as minimal risk based on the severity of the depressive symptoms  Discharge Diagnoses:   AXIS I:  Schizoaffective Disorder-depressed type AXIS II:  Deferred AXIS III:   Past Medical History  Date  . Hypertension  . Seizure disorder  . Asthma  . Anemia  .   Marland Kitchen    AXIS IV:  other psychosocial or environmental problems AXIS V:  61-70 mild symptoms  Plan Of Care/Follow-up recommendations:  Activity as tolerated, healthy diet, Valproic acid level post discharge. Patient to follow up with outpatient appointment, and take her medication as recommended.   Is patient on multiple antipsychotic therapies at discharge:  No   Has Patient had three or more failed trials of antipsychotic monotherapy by history:  No  Recommended Plan for Multiple Antipsychotic Therapies: N/A  Yvette Mcglown,MD 06/25/2012, 11:35 AM
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
## 2012-06-25 NOTE — Progress Notes (Signed)
Patient ID: [name], female   DOB: April 15, 1971, 41 y.o.   MRN: [id number]   D:  Pt was observed in the dayroom interacting with her peers. During the assessment pt seemed to be experiencing racing thoughts. Pt talk about subjects ranging from her mother, to school, to the pimples on her face. Pt discussed discharge to a group home and discussed several options available. "I typically do good for about 6 moths". Writer asked the pt what happens after 6 months. Stated, that she likes to change. Stated, "When I was in school I did good for the 6 months of the semester. Then I like to pack up and move."  A:  Adm hs meds and offered support and encouragement. 15 min checks continued for safety.  R: Pt remains safe.

## 2012-06-25 NOTE — Progress Notes (Signed)
[facility] In Patient Progress Note 06/25/2012 9:26 AM [name] Jul 01, 1971 [id number] Hospital day #:2 Diagnosis: Schizoaffective disorder-depressed type   ADL's:  Intact Sleep:  Minimally improved Appetite:ok  Groups:Good  Subjective: " I am having trouble sleeping and still feeling depressed"   height is 5\' 6"  (1.676 m) and weight is 102.967 kg (227 lb). Her oral temperature is 98.1 F (36.7 C). Her blood pressure is 123/86 and her pulse is 105. Her respiration is 20.   Objective: Patient remains depressed, amotivated with low energy level, and difficulty sleeping. Patient denies suicidal thoughts but still reports feeling lonely and does not want to return to her apartment. She asking to be placed in a group home.  Ros: Constitutional: WD WN Adult in NAD ENT:      negative for runny nose, sore throat, congestion, dysphagia COR:     negative for cough, SOB, chest pain, wheezing GI:         negative for Nausea, vomiting, diarrhea, constipation, abdominal pain Neuro:  negative for dizziness, blurred vision, headaches, numbness or tingling Ortho:   negative for limb pain, swelling, change in ambulatory status.  Mental Status Exam Level of Consciousness: awake Orientation: x 3 General Appearance:  casual Behavior:  cooperative Eye Contact:  good Motor Behavior:  normal Speech:  Clear and coherent Mood:dysphoric Suicidal Ideation: No suicidal ideation, no plan, no intent, no means. Homicidal Ideation:  No homicidal ideation, no plan, no intent, no means.  Affect:  flat Anxiety Level: mild Thought Process:  linear Thought Content:  No AVH Perception:  intact Judgment:  Poor, limited, fair, good Insight:  Poor, limited, fair, good, absent Cognition:  Below average, average, above average Sleep:  Number of Hours: 6.75     Lab Results: No results found for this or any previous visit (from the past 48 hour(s)). Labs are reviewed. Physical Findings: AIMS:   CIWA:    COWS:     Medication:   . aspirin EC  81 mg Oral QODAY  . benztropine  1 mg Oral QHS  . divalproex  1,000 mg Oral QHS  . influenza  inactive virus vaccine  0.5 mL Intramuscular Tomorrow-1000  . risperiDONE  2 mg Oral QHS  . sertraline  100 mg Oral QHS  . traZODone  50 mg Oral QHS    Treatment Plan Summary: 1. Admit for crisis management and stabilization. 2. Medication management to reduce current symptoms to base line and improve the     patient's overall level of functioning 3. Treat health problems as indicated. 4. Develop treatment plan to decrease risk of relapse upon discharge and the need for         readmission. 5. Psycho-social education regarding relapse prevention and self care. 6. Health care follow up as needed for medical problems. 7. Will monitor depakote level.   Plan: [name], MD 06/25/2012, 9:26 AM

## 2012-06-25 NOTE — Discharge Planning (Signed)
[name] attended both AM and PM group.  She was an active participant in both. She told stories and chimed in with seemingly unrelated topics, but when pushed could always make a connection with a word or phrase. Loose associatiions and somewhat disorganized thinking, which is not far from her baseline.

## 2012-06-25 NOTE — Progress Notes (Signed)
The focus of this group is to help patients review their daily goal of treatment and discuss progress on daily workbooks. Pt attended the evening group and participated in the discussion about finding positives and expressing personal needs. Pt also listened to the comments of her peers and offered support and encouragement to others.

## 2012-06-25 NOTE — Progress Notes (Signed)
Psychoeducational Group Note  Date:  06/25/2012 Time:  0930  Group Topic/Focus:  Recovery Goals:   The focus of this group is to identify appropriate goals for recovery and establish a plan to achieve them.  Participation Level:  Did Not Attend  Participation Quality:    Affect:    Cognitive:    Insight:    Engagement in Group:    Additional Comments:  Pt was sleeping.  Isla Pence M 06/25/2012, 11:16 AM

## 2012-06-26 NOTE — Progress Notes (Signed)
Psychoeducational Group Note  Date:  06/26/2012 Time:  2000  Group Topic/Focus:  Wrap-Up Group:   The focus of this group is to help patients review their daily goal of treatment and discuss progress on daily workbooks.  Participation Level:  Active  Participation Quality:  Appropriate  Affect:  Appropriate  Cognitive:  Appropriate  Insight:  Limited  Engagement in Group:  Good  Additional Comments:  None  Rhena Glace A 06/26/2012, 10:46 PM

## 2012-06-26 NOTE — Progress Notes (Signed)
D: Patient denies SI/HI and visual hallucinations but is positive for auditory hallucinations. The patient states that she hears "children's voices at times" although she "can't make out what they're saying." The patient has a sad mood and a blunted affect. The patient rates her depression a 10 out of 10 and her hopelessness a 3 out of 10 (1 low/10 high). The patient reports sleeping well and states that her appetite is good but that her energy level is low. The patient states that she is "doing ok today" and that she wants to "get better."  A: Patient given emotional support from RN. Patient encouraged to come to staff with concerns and/or questions. [name] medication routine continued. [name] orders and plan of care reviewed.  R: Patient remains cooperative. Will continue to monitor patient q15 minutes for safety.

## 2012-06-26 NOTE — Progress Notes (Signed)
D: Patient in dayroom on approach.  Patient  States she feels that she is getting better.  Patient states she has been tired recently .  Patient states she would like to focus on school.  Patient states she was living on her own in an apartment for 7 months but thinks she will go to live in a group home when discharged.  Patient positive for passive SI but contracts for safety.  PAtietn denies SI and AVH. A: Staff to monitor Q 15 mins for safety.  Encouragement and support offered.  Scheduled medications administered per orders.   R: Patient remains safe on the unit.  Patient attended group tonight.  Patient visible on the unit but not interacting with peers.  Patient taking administered medications

## 2012-06-26 NOTE — Discharge Planning (Signed)
Pt attended morning aftercare planning group. Pt was pleasant during group and was told by SW that there was an opening at group home in [place]. She had stayed there in the past, was wanting to go back, and when I called  to find out about openings and going back, they had an opening and were willing to take her back. Dr Gildardo Cranker likely d/c of Fri AM.  Let Belenda know about this, and about continuing to follow up with [facility].  [name] attended PM group run by Onalee Hua of [facility].  She was attentive and contributed appropriately.

## 2012-06-26 NOTE — Progress Notes (Signed)
Psychoeducational Group Note  Date:  06/26/2012 Time:  1100  Group Topic/Focus:  Crisis Planning:   The purpose of this group is to help patients create a crisis plan for use upon discharge or in the future, as needed.  Participation Level:  Did Not Attend  Participation Quality:    Affect:    Cognitive:    Insight:    Engagement in Group:    Additional Comments:  Pt was sleeping.  Isla Pence M 06/26/2012, 2:23 PM

## 2012-06-26 NOTE — Progress Notes (Signed)
Patient ID: [name], female   DOB: September 15, 1970, 41 y.o.   MRN: [id number] [facility] In Patient Progress Note 06/25/2012 9:26 AM [name] 05-04-1971 [id number] Hospital day #:3  Diagnosis: Schizoaffective disorder-depressed type   ADL's:  Intact Sleep:  Minimally improved Appetite:ok  Groups:Good  Subjective: " I had trouble sleeping last night and my depression is 3/10 today"   height is 5\' 6"  (1.676 m) and weight is 102.967 kg (227 lb). Her oral temperature is 98.1 F (36.7 C). Her blood pressure is 123/86 and her pulse is 105. Her respiration is 20.   Objective: Patient remains depressed, with low energy level, and difficulty sleeping. She reports improving motivation.  Patient denies suicidal thoughts but still reports feeling lonely and does not want to return to her apartment. She asking to be placed in a group home.  Ros: Constitutional: WD WN Adult in NAD ENT:      negative for runny nose, sore throat, congestion, dysphagia COR:     negative for cough, SOB, chest pain, wheezing GI:         negative for Nausea, vomiting, diarrhea, constipation, abdominal pain Neuro:  negative for dizziness, blurred vision, headaches, numbness or tingling Ortho:   negative for limb pain, swelling, change in ambulatory status.  Mental Status Exam Level of Consciousness: awake Orientation: x 3 General Appearance:  casual Behavior:  cooperative Eye Contact:  good Motor Behavior:  normal Speech:  Clear and coherent Mood:less depressed Suicidal Ideation: No suicidal ideation, no plan, no intent, no means. Homicidal Ideation:  No homicidal ideation, no plan, no intent, no means.  Affect:  flat Anxiety Level: mild Thought Process:  linear Thought Content:  No AVH Perception:  intact Judgment:  Poor, limited, fair, good Insight:  Poor, limited, fair, good, absent Cognition:  Below average, average, above average Sleep:  Number of Hours: 6.75     Lab Results: No results found for this  or any previous visit (from the past 48 hour(s)). Labs are reviewed. Physical Findings: AIMS:   CIWA:    COWS:    Medication:   . aspirin EC  81 mg Oral QODAY  . benztropine  1 mg Oral QHS  . divalproex  1,000 mg Oral QHS  . influenza  inactive virus vaccine  0.5 mL Intramuscular Tomorrow-1000  . risperiDONE  2 mg Oral QHS  . sertraline  100 mg Oral QHS  . traZODone  50 mg Oral QHS    Treatment Plan Summary: 1. Admit for crisis management and stabilization. 2. Medication management to reduce current symptoms to base line and improve the     patient's overall level of functioning 3. Treat health problems as indicated. 4. Develop treatment plan to decrease risk of relapse upon discharge and the need for         readmission. 5. Psycho-social education regarding relapse prevention and self care. 6. Health care follow up as needed for medical problems. 7. Will monitor depakote level.   Plan: [name], MD 06/26/2012, 9:02 AM

## 2012-06-26 NOTE — Progress Notes (Signed)
Adult Psychosocial Assessment Update Interdisciplinary Team  Previous [facility] Hospital admissions/discharges:  Admissions Date: 09/03/10  Date: 06/22/12 Date: 05/09/11  Date: 04/04/12 Date: 03/28/09  Date: 06/16/11 Date: 02/07/09  Date: 04/18/11 Date: 10/11/08  Date: 09/27/10 Date: 02/07/06   Changes since the last Psychosocial Assessment (including adherence to outpatient mental health and/or substance abuse treatment, situational issues contributing to decompensation and/or relapse). Patient explained that she had been living alone in apartment for several months and her mother was concerned about this in addition to patient being non-compliant with medications. [name] mother thought that she would do better living in group home. Patient stated that she was "sliding" and that it was hard for her to get out of bed most days. Patient was taking some classes at [facility] [employer] [facility], but due to mental health problems, she had to drop out.               Discharge Plan 1. Will you be returning to the same living situation after discharge?    No:      If no, what is your plan?    No, Patient will be going to [employer] [employer] in [place].        2. Would you like a referral for services when you are discharged? Yes:     If yes, for what services?  No:       Patient is seen by [facility] (Dr. Hortencia Pilar)       Summary and Recommendations (to be completed by the evaluator) Patient has been accepted to [facility] in [place]  and will continue to be seen by [facility]. Discharge planned for Friday, Nov. 1                       Signature:  Smart, [name], 06/26/2012 9:23 AM

## 2012-06-27 NOTE — Progress Notes (Signed)
Psychoeducational Group Note  Date:  06/27/2012 Time:  0930  Group Topic/Focus:  Overcoming Stress:   The focus of this group is to define stress and help patients assess their triggers.  Participation Level:  Minimal  Participation Quality:  Inattentive  Affect:  Flat  Cognitive:  Oriented  Insight:  None  Engagement in Group:  Limited  Additional Comments:  Pt attended group but did not  participate  Itzayanna Kaster E 06/27/2012, 2:21 PM

## 2012-06-27 NOTE — Progress Notes (Signed)
D: Patient denies SI/HI and auditory and visual hallucinations. The patient has a sad mood and a blunted affect. The patient rates her depression a 2 out of 10 and her hopelessness a 1 out of 10 (1 low/10 high). The patient reports sleeping well and states that her appetite is improving but that her energy level is normal. The patient states that her goal, for when she is discharged, is to "follow instructions to the best of 'her' ability." The patient is interacting appropriately within the milieu and is attending groups.  A: Patient given emotional support from RN. Patient encouraged to come to staff with concerns and/or questions. [name] medication routine continued. [name] orders and plan of care reviewed.  R: Patient remains appropriate and cooperative. Will continue to monitor patient q15 minutes for safety.

## 2012-06-27 NOTE — Progress Notes (Signed)
Patient ID: [name], female   DOB: 1971/06/20, 41 y.o.   MRN: [id number] Patient ID: [name], female   DOB: [birth year], 41 y.o.   MRN: [id number] [facility] In Patient Progress Note 06/25/2012 9:26 AM [name] [birth year] [id number] Hospital day #:4  Diagnosis: Schizoaffective disorder-depressed type   ADL's:  Intact Sleep:  Good Appetite:Good  Groups:Good  Subjective: " I slept better last night, feels more motivated and my depression is 2/10 today"   height is 5\' 6"  (1.676 m) and weight is 102.967 kg (227 lb). Her oral temperature is 98.1 F (36.7 C). Her blood pressure is 123/86 and her pulse is 105. Her respiration is 20.   Objective: Patient reports improving  Depressive symptoms, and energy level.  Patient denies suicidal thoughts but still reports feeling lonely and does not want to return to her apartment. Case worker has secured patient a placement in a group home, she will be placed on Friday. Constitutional: negative ENT:      negative for runny nose, sore throat, congestion, dysphagia COR:     negative for cough, SOB, chest pain, wheezing GI:         negative for Nausea, vomiting, diarrhea, constipation, abdominal pain Neuro:  negative for dizziness, blurred vision, headaches, numbness or tingling Ortho:   negative for limb pain, swelling, change in ambulatory status.  Mental Status Exam Level of Consciousness: awake Orientation: x 3 General Appearance:  casual Behavior:  cooperative Eye Contact:  good Motor Behavior:  normal Speech:  Clear and coherent Mood:less depressed Suicidal Ideation: No suicidal ideation, no plan, no intent, no means. Homicidal Ideation:  No homicidal ideation, no plan, no intent, no means.  Affect:  neutral Anxiety Level: mild Thought Process:  linear Thought Content:  No AVH Perception:  intact Judgment:  Poor, limited, fair, good Insight:  Marginal Cognition:  average Sleep:  Number of Hours: 6.25    Lab Results: No results  found for this or any previous visit (from the past 48 hour(s)). Labs are reviewed. Physical Findings: AIMS:   CIWA:    COWS:    Medication:   . aspirin EC  81 mg Oral QODAY  . benztropine  1 mg Oral QHS  . divalproex  1,000 mg Oral QHS  . influenza  inactive virus vaccine  0.5 mL Intramuscular Tomorrow-1000  . risperiDONE  2 mg Oral QHS  . sertraline  100 mg Oral QHS  . traZODone  50 mg Oral QHS    Treatment Plan Summary: 1. Admit for crisis management and stabilization. 2. Medication management to reduce current symptoms to base line and improve the     patient's overall level of functioning 3. Treat health problems as indicated. 4. Develop treatment plan to decrease risk of relapse upon discharge and the need for         readmission. 5. Psycho-social education regarding relapse prevention and self care. 6. Health care follow up as needed for medical problems. 7. Valproic acid level in AM.   Plan: [name], MD 06/26/2012, 9:02 AM

## 2012-06-27 NOTE — Discharge Planning (Signed)
Pt attended morning aftercare planning group. When asked about how she felt today, pt responded, "I'm livin." She reported that she is working on positive changes for herself. SW discussed [facility] team available in [place]  for pt and made pt aware that Mr. Marvis Moeller was told that she is set for discharge tomorrow morning around 8am. Pt gave permission for SW to contact mother to let her know what is going on [phone]). Pt said that she left message with her father to tell him about her plans to enter group home post d/c.  [name] attended PM group with focus on families.  She described a Chevelle that her father owned when she was young, and how he like to "rock out" in it.  This led to a tangential story about Chevelles in dreams.

## 2012-06-27 NOTE — Care Management Utilization Note (Signed)
Per State Regulation 482.30  This chart was reviewed for medical necessity with respect to the patient's Admission/Duration of stay.   Next review due:  11.3.13   Ambrose Mantle, LCSW  06/27/2012  9:31 AM

## 2012-06-28 LAB — VALPROIC ACID LEVEL: Valproic Acid Lvl: 103.9 ug/mL — ABNORMAL HIGH (ref 50.0–100.0)

## 2012-06-28 MED ORDER — TRAZODONE HCL 50 MG PO TABS: 50.0000 mg | ORAL_TABLET | Freq: Every day | ORAL | Status: DC

## 2012-06-28 MED ORDER — ASPIRIN EC 81 MG PO TBEC
81.0000 mg | DELAYED_RELEASE_TABLET | ORAL | Status: DC
  Filled 2012-06-28 (×2): qty 7

## 2012-06-28 MED ORDER — RISPERIDONE 2 MG PO TABS
2.0000 mg | ORAL_TABLET | Freq: Every day | ORAL | Status: DC
  Filled 2012-06-28: qty 14

## 2012-06-28 MED ORDER — RISPERIDONE 2 MG PO TABS: 2.0000 mg | ORAL_TABLET | Freq: Every day | ORAL | Status: DC

## 2012-06-28 MED ORDER — SERTRALINE HCL 100 MG PO TABS: 100.0000 mg | ORAL_TABLET | Freq: Every day | ORAL | Status: DC

## 2012-06-28 MED ORDER — BENZTROPINE MESYLATE 1 MG PO TABS
1.0000 mg | ORAL_TABLET | Freq: Every day | ORAL | Status: DC
  Filled 2012-06-28: qty 28

## 2012-06-28 MED ORDER — DIVALPROEX SODIUM 500 MG PO DR TAB: 1000.0000 mg | DELAYED_RELEASE_TABLET | Freq: Every day | ORAL | Status: DC

## 2012-06-28 MED ORDER — TRAZODONE HCL 50 MG PO TABS
50.0000 mg | ORAL_TABLET | Freq: Every day | ORAL | Status: DC
  Filled 2012-06-28: qty 14

## 2012-06-28 MED ORDER — ASPIRIN 81 MG PO TBEC: 81.0000 mg | DELAYED_RELEASE_TABLET | ORAL | Status: DC

## 2012-06-28 MED ORDER — DIPHENHYDRAMINE HCL 25 MG PO CAPS
25.0000 mg | ORAL_CAPSULE | Freq: Four times a day (QID) | ORAL | Status: DC | PRN
  Filled 2012-06-28 (×2): qty 10

## 2012-06-28 MED ORDER — SERTRALINE HCL 100 MG PO TABS
100.0000 mg | ORAL_TABLET | Freq: Every day | ORAL | Status: DC
  Filled 2012-06-28: qty 14

## 2012-06-28 MED ORDER — BENZTROPINE MESYLATE 1 MG PO TABS: 1.0000 mg | ORAL_TABLET | Freq: Every day | ORAL | Status: DC

## 2012-06-28 MED ORDER — DIVALPROEX SODIUM 500 MG PO DR TAB
1000.0000 mg | DELAYED_RELEASE_TABLET | Freq: Every day | ORAL | Status: DC
  Filled 2012-06-28: qty 28

## 2012-06-28 NOTE — [facility] Suicide Risk Assessment (Signed)
Suicide Risk Assessment  Discharge Assessment     Demographic Factors:  Female  Mental Status Per Nursing Assessment::   On Admission:  Suicidal ideation indicated by patient  Current Mental Status by Physician: Patient now denies suicidal ideations, intent or plan  Loss Factors: Financial problems/change in socioeconomic status  Historical Factors: NA  Risk Reduction Factors:   Sense of responsibility to family, Positive social support and Positive therapeutic relationship  Continued Clinical Symptoms:  Schizoaffective disorder-depressed type  Cognitive Features That Contribute To Risk:  Polarized thinking    Suicide Risk:  Minimal: No identifiable suicidal ideation.  Patients presenting with no risk factors but with morbid ruminations; may be classified as minimal risk based on the severity of the depressive symptoms  Discharge Diagnoses:   AXIS I:  Schizoaffective Disorder AXIS II:  Deferred AXIS III:   Past Medical History  Diagnosis Date  . Anemia   . Asthma   . Hypertension   . Seizure disorder    AXIS IV:  other psychosocial or environmental problems and problems related to social environment AXIS V:  61-70 mild symptoms  Plan Of Care/Follow-up recommendations:  Activity:  as tolerated Diet:  healthy Tests:  Valproic acid level on outpatient Other:  Patient to keep after care appointment and take her medications as recommended  Is patient on multiple antipsychotic therapies at discharge:  No   Has Patient had three or more failed trials of antipsychotic monotherapy by history:  No  Recommended Plan for Multiple Antipsychotic Therapies: N/A  Yvette Hamelin,MD 06/28/2012, 8:06 AM

## 2012-06-28 NOTE — Progress Notes (Signed)
[facility] Case Management Discharge Plan:  Will you be returning to the same living situation after discharge: No. At discharge, do you have transportation home?:Yes,  [name] will be driving her to group home Do you have the ability to pay for your medications:Yes,  Medicaid  Interagency Information:     Release of information consent forms completed and in the chart;  [name] signature needed at discharge.  Patient to Follow up at:  Follow-up Information    Follow up with [facility]. On 07/01/2012. (Will see you Monday at 11:30  Call to confirm)    Contact information:   [address]   [address] [place], [place] [address] [phone] Fax [phone]          Patient denies SI/HI:   Yes,  yes    Safety Planning and Suicide Prevention discussed:  Yes,  yes  Barrier to discharge identified:No.  Summary and Recommendations:   [name], [name] 06/28/2012, 9:17 AM

## 2012-06-28 NOTE — Discharge Summary (Signed)
Physician Discharge Summary Note  Patient:  [name] is an 41 y.o., female MRN:  [id number] DOB:  1971-06-12 Patient phone:  [phone] (home)  Patient address:   [address] [place] [address],   Date of Admission:  06/23/2012 Date of Discharge: 06/28/2012  Reason for Admission:  Schizoaffective Disorder-Depressed type   Discharge Diagnoses: Active Problems:  * No active hospital problems. *    Axis Diagnosis:   AXIS I:  Schizoaffective Disorder, depressive type AXIS II:  Deferred AXIS III:   Past Medical History  Diagnosis Date  . Anemia   . Asthma   . Hypertension   . Schizophrenic disorder   . Seizures   . Anxiety   . Depression   . Insomnia, persistent    AXIS IV:  other psychosocial or environmental problems AXIS V:  61-70 mild symptoms  Level of Care:  OP  Hospital Course:        Ms. Whiteley was admitted for Schizoaffective disorder, depressive type and crisis management.  She was treated with the standard protocol.  Medical problems were identified and treated.  Home medication was restarted as appropriate.  Improvement was monitored by CIWA/COWS scores and patient's daily report of symptom reduction. Emotional and mental status was monitored by daily self inventory reports completed by the patient and clinical staff.  The patient was evaluated by the treatment team for stability and plans for continued recovery upon discharge. She was offered further treatment options upon discharge including Residential, IOP, and Outpatient treatment.  The patient's motivation was an integral factor for scheduling further treatment.  Employment, transportation, bed availability, health status, family support, and any pending legal issues were also considered.    Upon completion of detox the patient was both mentally and medically stable for discharge.    Consults:  None  Significant Diagnostic Studies: See labs  Discharge Vitals:   Blood pressure 123/86,  pulse 96, temperature 97.3 F (36.3 C), temperature source Oral, resp. rate 17, height 5\' 6"  (1.676 m), weight 102.967 kg (227 lb), last menstrual period 06/18/2012.  Lab Results:   Results for orders placed during the hospital encounter of 06/23/12 (from the past 72 hour(s))  VALPROIC ACID LEVEL     Status: Abnormal   Collection Time   06/28/12  6:20 AM      Component Value Range Comment   Valproic Acid Lvl 103.9 (*) 50.0 - 100.0 ug/mL    Physical Findings: AIMS: Facial and Oral Movements Muscles of Facial Expression: None, normal Lips and Perioral Area: None, normal Jaw: None, normal Tongue: None, normal,Extremity Movements Upper (arms, wrists, hands, fingers): None, normal Lower (legs, knees, ankles, toes): None, normal, Trunk Movements Neck, shoulders, hips: None, normal, Overall Severity Severity of abnormal movements (highest score from questions above): None, normal Incapacitation due to abnormal movements: None, normal [name] awareness of abnormal movements (rate only patient's report): No Awareness, Dental Status Current problems with teeth and/or dentures?: No Does patient usually wear dentures?: No   CIWA:  CIWA-Ar Total: 0  COWS:  COWS Total Score: 0   Mental Status Exam: See Mental Status Examination and Suicide Risk Assessment completed by Attending Physician prior to discharge.  Discharge destination:  RTC  Is patient on multiple antipsychotic therapies at discharge:  No   Has Patient had three or more failed trials of antipsychotic monotherapy by history:  No  Recommended Plan for Multiple Antipsychotic Therapies:  N/A  Discharge Orders    Future Orders Please Complete By Expires  Diet - low sodium heart healthy      Activity as tolerated - No restrictions          Medication List     As of 06/28/2012  9:15 AM    STOP taking these medications         ibuprofen 600 MG tablet   Commonly known as: ADVIL,MOTRIN      naproxen sodium 220 MG tablet    Commonly known as: ANAPROX      OVER THE COUNTER MEDICATION      TAKE these medications      Indication    aspirin 81 MG EC tablet   Take 1 tablet (81 mg total) by mouth every other day.    Indication: Mild to Moderate Pain      benztropine 1 MG tablet   Commonly known as: COGENTIN   Take 1 tablet (1 mg total) by mouth at bedtime.    Indication: Extrapyramidal Reaction caused by Medications      diphenhydrAMINE 25 MG tablet   Commonly known as: BENADRYL   Take 25 mg by mouth every 6 (six) hours as needed. For itching       divalproex 500 MG DR tablet   Commonly known as: DEPAKOTE   Take 2 tablets (1,000 mg total) by mouth at bedtime.    Indication: Depressive Phase of Manic-Depression      risperiDONE 2 MG tablet   Commonly known as: RISPERDAL   Take 1 tablet (2 mg total) by mouth at bedtime.    Indication: Manic-Depression      risperiDONE microspheres 50 MG injection   Commonly known as: RISPERDAL CONSTA   Inject 50 mg into the muscle every 14 (fourteen) days.       sertraline 100 MG tablet   Commonly known as: ZOLOFT   Take 1 tablet (100 mg total) by mouth at bedtime.    Indication: Major Depressive Disorder      traZODone 50 MG tablet   Commonly known as: DESYREL   Take 1 tablet (50 mg total) by mouth at bedtime.    Indication: Trouble Sleeping           Follow-up Information    Follow up with [facility]. On 07/01/2012. (Will see you Monday at 11:30  Call to confirm)    Contact information:   [address]   [address] [place], [place] [address] [phone] Fax [phone]          Follow-up recommendations:  See above for follow up information  Comments:  Take all of your medications as prescribed.  Be sure to keep ALL follow up appointments as scheduled. This is to ensure getting your refills on time to avoid any interruption in your medication.  If you find that you can not keep your appointment, call the clinic and reschedule. Be sure to tell the nurse  if you will need a refill before your appointment.   SignedAssunta Found 06/28/2012, 9:15 AM

## 2012-06-28 NOTE — Progress Notes (Signed)
D   Pt is flat and sad but she denies feeling depressed   She attended group and participated  She interacts appropriately with others   Pt reports being ready for discharge   Her thinking is more logical and coherent A   Verbal support given   Medications administered and effectiveness monitored   Q 15 min checks R   Pt safe at present

## 2012-06-28 NOTE — Progress Notes (Signed)
D) Pt being discharged to home accompainied by the group home owner. Pt states she is happy about being able to live in this group home. Has met the owner and says that he is nice. Denies SI and HI. Denies delusions, hallunications and ideas of reference. Affect is flat and mood is depressed, but did brighten with some interaction. Pt is at baseline. A) Given support and reassurance. Given praise. All belongings returned to Pt. Pt is aware of her follow up plans for care. Was given a weeks supply of medication to cover her and prescriptions until she is seen by a doctor.  R) all belongings returned to the Pt., all forms signed and Pt can repeat back information explained to her and states she is going to obtain her information on the computer. States she is feeling stable and is happy she has found a place to live.

## 2012-06-28 NOTE — Discharge Summary (Signed)
  Read and agreed.  [name], MD 06/28/2012 11:37 AM

## 2012-06-28 NOTE — Progress Notes (Signed)
[facility] Adult Inpatient Family/Significant Other Suicide Prevention Education  Suicide Prevention Education:  Patient Discharged to Other Healthcare Facility:  Suicide Prevention Education Not Provided: The patient is discharging to another healthcare facility for continuation of treatment.  The patient's medical information, including suicide ideations and risk factors, are a part of the medical information shared with the receiving healthcare facility. [name] is going to [facility] [employer] with [name].  She has been there before, and she is well known by them.  He was given all information pertaining to her symptoms at admission, as well as follow up with ACT team.  He confirmed that there is a crises intervention plan at the [facility].  [name] 06/28/2012, 11:10 AM

## 2012-07-01 NOTE — Progress Notes (Signed)
Patient Discharge Instructions:  After Visit Summary (AVS):   Faxed to:  07/01/2012 Psychiatric Admission Assessment Note:   Faxed to:  07/01/2012 Suicide Risk Assessment - Discharge Assessment:   Faxed to:  07/01/2012 Faxed/Sent to the Next Level Care provider:  07/01/2012  Faxed to [facility] @ [phone]  Wandra Scot, 07/01/2012, 4:07 PM

## 2013-05-04 ENCOUNTER — Emergency Department (EMERGENCY_DEPARTMENT_HOSPITAL)
Admission: EM | Admit: 2013-05-04 | Discharge: 2013-05-05 | Disposition: A | Discharge status: Other Acute Inpatient Hospital | Payer: Medicare Other | Source: Home / Self Care | Attending: Emergency Medicine | Admitting: Emergency Medicine

## 2013-05-04 ENCOUNTER — Ambulatory Visit (HOSPITAL_COMMUNITY)
Admission: AD | Admit: 2013-05-04 | Discharge: 2013-05-04 | Disposition: A | Discharge status: Home / Self Care | Payer: Medicare Other | Attending: Psychiatry | Admitting: Psychiatry

## 2013-05-04 ENCOUNTER — Ambulatory Visit (HOSPITAL_COMMUNITY): Admit: 2013-05-04 | Payer: Self-pay | Admitting: Psychiatry

## 2013-05-04 ENCOUNTER — Encounter (HOSPITAL_COMMUNITY): Payer: Self-pay | Admitting: Emergency Medicine

## 2013-05-04 DIAGNOSIS — F411 Generalized anxiety disorder: Secondary | ICD-10-CM | POA: Insufficient documentation

## 2013-05-04 DIAGNOSIS — F259 Schizoaffective disorder, unspecified: Secondary | ICD-10-CM | POA: Insufficient documentation

## 2013-05-04 DIAGNOSIS — Z7982 Long term (current) use of aspirin: Secondary | ICD-10-CM | POA: Insufficient documentation

## 2013-05-04 DIAGNOSIS — F209 Schizophrenia, unspecified: Secondary | ICD-10-CM

## 2013-05-04 DIAGNOSIS — Z880 Allergy status to penicillin: Secondary | ICD-10-CM | POA: Insufficient documentation

## 2013-05-04 DIAGNOSIS — Z8669 Personal history of other diseases of the nervous system and sense organs: Secondary | ICD-10-CM | POA: Insufficient documentation

## 2013-05-04 DIAGNOSIS — F3130 Bipolar disorder, current episode depressed, mild or moderate severity, unspecified: Secondary | ICD-10-CM | POA: Insufficient documentation

## 2013-05-04 DIAGNOSIS — Z862 Personal history of diseases of the blood and blood-forming organs and certain disorders involving the immune mechanism: Secondary | ICD-10-CM | POA: Insufficient documentation

## 2013-05-04 DIAGNOSIS — Z87891 Personal history of nicotine dependence: Secondary | ICD-10-CM | POA: Insufficient documentation

## 2013-05-04 DIAGNOSIS — F200 Paranoid schizophrenia: Secondary | ICD-10-CM | POA: Diagnosis present

## 2013-05-04 DIAGNOSIS — J45909 Unspecified asthma, uncomplicated: Secondary | ICD-10-CM | POA: Insufficient documentation

## 2013-05-04 DIAGNOSIS — R45851 Suicidal ideations: Secondary | ICD-10-CM | POA: Insufficient documentation

## 2013-05-04 DIAGNOSIS — I10 Essential (primary) hypertension: Secondary | ICD-10-CM | POA: Insufficient documentation

## 2013-05-04 DIAGNOSIS — F251 Schizoaffective disorder, depressive type: Secondary | ICD-10-CM

## 2013-05-04 HISTORY — DX: Bipolar disorder, unspecified: F31.9

## 2013-05-04 LAB — COMPREHENSIVE METABOLIC PANEL
AST: 15 U/L (ref 0–37)
Alkaline Phosphatase: 58 U/L (ref 39–117)
CO2: 28 mEq/L (ref 19–32)
Chloride: 101 mEq/L (ref 96–112)
GFR calc Af Amer: >90 mL/min (ref >90)
Sodium: 137 mEq/L (ref 135–145)

## 2013-05-04 LAB — CBC
Hemoglobin: 11.5 g/dL — ABNORMAL LOW (ref 12.0–15.0)
MCV: 88.9 fL (ref 78.0–100.0)
Platelets: 226 10*3/uL (ref 150–400)
RBC: 3.88 MIL/uL (ref 3.87–5.11)
WBC: 6.7 10*3/uL (ref 4.0–10.5)

## 2013-05-04 LAB — RAPID URINE DRUG SCREEN, HOSP PERFORMED
Benzodiazepines: NOT DETECTED
Cocaine: NOT DETECTED
Opiates: NOT DETECTED

## 2013-05-04 LAB — SALICYLATE LEVEL: Salicylate Lvl: <2 mg/dL — ABNORMAL LOW (ref 2.8–20.0)

## 2013-05-04 LAB — ETHANOL: Alcohol, Ethyl (B): <11 mg/dL (ref 0–11)

## 2013-05-04 MED ORDER — LORAZEPAM 1 MG PO TABS: 1.0000 mg | ORAL_TABLET | Freq: Three times a day (TID) | ORAL | Status: DC | PRN

## 2013-05-04 MED ORDER — IBUPROFEN 200 MG PO TABS: 600.0000 mg | ORAL_TABLET | Freq: Three times a day (TID) | ORAL | Status: DC | PRN

## 2013-05-04 MED ORDER — DIVALPROEX SODIUM 500 MG PO DR TAB
1000.0000 mg | DELAYED_RELEASE_TABLET | Freq: Every day | ORAL | Status: DC
  Administered 2013-05-04: 1000 mg via ORAL
  Filled 2013-05-04: qty 2

## 2013-05-04 MED ORDER — SERTRALINE HCL 50 MG PO TABS
100.0000 mg | ORAL_TABLET | Freq: Every day | ORAL | Status: DC
  Administered 2013-05-04: 100 mg via ORAL
  Filled 2013-05-04: qty 2

## 2013-05-04 MED ORDER — ONDANSETRON HCL 4 MG PO TABS: 4.0000 mg | ORAL_TABLET | Freq: Three times a day (TID) | ORAL | Status: DC | PRN

## 2013-05-04 MED ORDER — NICOTINE 21 MG/24HR TD PT24: 21.0000 mg | MEDICATED_PATCH | Freq: Every day | TRANSDERMAL | Status: DC

## 2013-05-04 MED ORDER — ZOLPIDEM TARTRATE 5 MG PO TABS
5.0000 mg | ORAL_TABLET | Freq: Every evening | ORAL | Status: DC | PRN
  Administered 2013-05-04: 5 mg via ORAL
  Filled 2013-05-04: qty 1

## 2013-05-04 MED ORDER — ALUM & MAG HYDROXIDE-SIMETH 200-200-20 MG/5ML PO SUSP: 30.0000 mL | ORAL | Status: DC | PRN

## 2013-05-04 MED ORDER — BENZTROPINE MESYLATE 1 MG PO TABS
1.0000 mg | ORAL_TABLET | Freq: Every day | ORAL | Status: DC
  Administered 2013-05-04: 1 mg via ORAL
  Filled 2013-05-04: qty 1

## 2013-05-04 MED ORDER — RISPERIDONE 2 MG PO TABS
2.0000 mg | ORAL_TABLET | Freq: Every day | ORAL | Status: DC
  Administered 2013-05-04: 2 mg via ORAL
  Filled 2013-05-04: qty 1

## 2013-05-04 MED ORDER — ASPIRIN EC 81 MG PO TBEC
81.0000 mg | DELAYED_RELEASE_TABLET | ORAL | Status: DC
  Administered 2013-05-05: 81 mg via ORAL
  Filled 2013-05-04: qty 1
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
## 2013-05-04 NOTE — ED Notes (Signed)
Pt now cooperative and is being pleasant with staff. Pt given a meal and a soda. Pt compliant with medications.
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
## 2013-05-04 NOTE — Consult Note (Signed)
[facility] Face-to-Face Psychiatry Consult   Reason for Consult:  ED referral after being sent for Medical [facility]arn[facility] S/P TTS assessment x 2 at [facility] today  Referring Physician:  ED Providers [name] is an 42 y.o. female.  Assessment: AXIS I:  Chronic schizophrenia with acute psychosis AXIS II:  Deferred AXIS III:   Past Medical History  Diagnosis Date  . Anemia   . Asthma   . Hypertension   . Schizophrenic disorder   . Seizures   . Anxiety   . Depression   . Insomnia, persistent   . Bipolar 1 disorder    AXIS IV:  problems related to social environment AXIS V:  21-30 behavior considerably influenced by delusions or hallucinations OR serious impairment in judgment, communication OR inability to function in almost all areas  Plan:  Recommend psychiatric Inpatient admission when medically cleared.  Subjective:   [name] is a 42 y.o. female patient admitted with acute deteroiration of her chronic schizophrenia.  HPI: See [facility] assessments x2 and ED provider note HPI Elements:   Context:  as above-pt has 18 prior admissions to [facility] since 2002.  Past Psychiatric History: Past Medical History  Diagnosis Date  . Anemia   . Asthma   . Hypertension   . Schizophrenic disorder   . Seizures   . Anxiety   . Depression   . Insomnia, persistent   . Bipolar 1 disorder     reports that she has quit smoking. She has never used smokeless tobacco. She reports that she does not drink alcohol or use illicit drugs. No family history on file.         Allergies:   Allergies  Allergen Reactions  . Haldol [Haloperidol Decanoate] Other (See Comments)    Stiffness, eyes bulging  . Penicillins Nausea And Vomiting  . Shrimp [Shellfish Allergy] Rash    ACT Assessment Complete:  Yes:    Educational Status    Risk to Self: Risk to self Is patient at risk for suicide?: Yes Substance abuse history and/or treatment for substance abuse?: No  Risk to Others:  ?  Abuse:  None reported  Prior  Inpatient Therapy:  18 x at [facility] see old records  Prior Outpatient Therapy:  [facility]  Additional Information:  Conflict with mother over her SSDI check                  Objective: Blood pressure 119/81, pulse 90, temperature 98.1 F (36.7 C), temperature source Oral, resp. rate 16, height 5\' 6"  (1.676 m), weight 104.327 kg (230 lb), last menstrual period 04/19/2013, SpO2 98.00%.Body mass index is 37.14 kg/(m^2). Results for orders placed during the hospital encounter of 05/04/13 (from the past 72 hour(s))  ACETAMINOPHEN LEVEL     Status: None   Collection Time    05/04/13  9:19 PM      Result Value Range   Acetaminophen (Tylenol), Serum <15.0  10 - 30 ug/mL   Comment:            THERAPEUTIC CONCENTRATIONS VARY     SIGNIFICANTLY. A RANGE OF 10-30     ug/mL MAY BE AN EFFECTIVE     CONCENTRATION FOR MANY PATIENTS.     HOWEVER, SOME ARE BEST TREATED     AT CONCENTRATIONS OUTSIDE THIS     RANGE.     ACETAMINOPHEN CONCENTRATIONS     >150 ug/mL AT 4 HOURS AFTER     INGESTION AND >50 ug/mL AT 12  HOURS AFTER INGESTION ARE     OFTEN ASSOCIATED WITH TOXIC     REACTIONS.  CBC     Status: Abnormal   Collection Time    05/04/13  9:19 PM      Result Value Range   WBC 6.7  4.0 - 10.5 K/uL   RBC 3.88  3.87 - 5.11 MIL/uL   Hemoglobin 11.5 (*) 12.0 - 15.0 g/dL   HCT 16.1 (*) 09.6 - 04.5 %   MCV 88.9  78.0 - 100.0 fL   MCH 29.6  26.0 - 34.0 pg   MCHC 33.3  30.0 - 36.0 g/dL   RDW 40.9  81.1 - 91.4 %   Platelets 226  150 - 400 K/uL  COMPREHENSIVE METABOLIC PANEL     Status: Abnormal   Collection Time    05/04/13  9:19 PM      Result Value Range   Sodium 137  135 - 145 mEq/L   Potassium 3.6  3.5 - 5.1 mEq/L   Chloride 101  96 - 112 mEq/L   CO2 28  19 - 32 mEq/L   Glucose, Bld 96  70 - 99 mg/dL   BUN 14  6 - 23 mg/dL   Creatinine, Ser 7.82  0.50 - 1.10 mg/dL   Calcium 9.8  8.4 - 95.6 mg/dL   Total Protein 7.5  6.0 - 8.3 g/dL   Albumin 3.8  3.5 - 5.2 g/dL   AST 15  0 -  37 U/L   ALT 14  0 - 35 U/L   Alkaline Phosphatase 58  39 - 117 U/L   Total Bilirubin 0.2 (*) 0.3 - 1.2 mg/dL   GFR calc non Af Amer >90  >90 mL/min   GFR calc Af Amer >90  >90 mL/min   Comment: (NOTE)     The eGFR has been calculated using the CKD EPI equation.     This calculation has not been validated in all clinical situations.     eGFR's persistently <90 mL/min signify possible Chronic Kidney     Disease.  ETHANOL     Status: None   Collection Time    05/04/13  9:19 PM      Result Value Range   Alcohol, Ethyl (B) <11  0 - 11 mg/dL   Comment:            LOWEST DETECTABLE LIMIT FOR     SERUM ALCOHOL IS 11 mg/dL     FOR MEDICAL PURPOSES ONLY  SALICYLATE LEVEL     Status: Abnormal   Collection Time    05/04/13  9:19 PM      Result Value Range   Salicylate Lvl <2.0 (*) 2.8 - 20.0 mg/dL  URINE RAPID DRUG SCREEN (HOSP PERFORMED)     Status: None   Collection Time    05/04/13  9:40 PM      Result Value Range   Opiates NONE DETECTED  NONE DETECTED   Cocaine NONE DETECTED  NONE DETECTED   Benzodiazepines NONE DETECTED  NONE DETECTED   Amphetamines NONE DETECTED  NONE DETECTED   Tetrahydrocannabinol NONE DETECTED  NONE DETECTED   Barbiturates NONE DETECTED  NONE DETECTED   Comment:            DRUG SCREEN FOR MEDICAL PURPOSES     ONLY.  IF CONFIRMATION IS NEEDED     FOR ANY PURPOSE, NOTIFY LAB     WITHIN 5 DAYS.  LOWEST DETECTABLE LIMITS     FOR URINE DRUG SCREEN     Drug Class       Cutoff (ng/mL)     Amphetamine      1000     Barbiturate      200     Benzodiazepine   200     Tricyclics       300     Opiates          300     Cocaine          300     THC              50   Labs are reviewed and are pertinent for essentially normal with negative UDS.  Current Facility-Administered Medications  Medication Dose Route Frequency Provider Last Rate Last Dose  . alum & mag hydroxide-simeth (MAALOX/MYLANTA) 200-200-20 MG/5ML suspension 30 mL  30 mL Oral PRN  Hannah Muthersbaugh, PA-C      . [START ON 05/05/2013] aspirin EC tablet 81 mg  81 mg Oral QODAY Hannah Muthersbaugh, PA-C      . benztropine (COGENTIN) tablet 1 mg  1 mg Oral QHS Hannah Muthersbaugh, PA-C   1 mg at 05/04/13 2232  . divalproex (DEPAKOTE) DR tablet 1,000 mg  1,000 mg Oral QHS Hannah Muthersbaugh, PA-C   1,000 mg at 05/04/13 2232  . ibuprofen (ADVIL,MOTRIN) tablet 600 mg  600 mg Oral Q8H PRN Hannah Muthersbaugh, PA-C      . LORazepam (ATIVAN) tablet 1 mg  1 mg Oral Q8H PRN Hannah Muthersbaugh, PA-C      . [START ON 05/05/2013] nicotine (NICODERM CQ - dosed in mg/24 hours) patch 21 mg  21 mg Transdermal Daily Hannah Muthersbaugh, PA-C      . ondansetron (ZOFRAN) tablet 4 mg  4 mg Oral Q8H PRN Hannah Muthersbaugh, PA-C      . risperiDONE (RISPERDAL) tablet 2 mg  2 mg Oral QHS Hannah Muthersbaugh, PA-C   2 mg at 05/04/13 2232  . sertraline (ZOLOFT) tablet 100 mg  100 mg Oral QHS Hannah Muthersbaugh, PA-C   100 mg at 05/04/13 2232  . zolpidem (AMBIEN) tablet 5 mg  5 mg Oral QHS PRN Dierdre Forth, PA-C   5 mg at 05/04/13 2235   Current Outpatient Prescriptions  Medication Sig Dispense Refill  . aspirin EC 81 MG EC tablet Take 1 tablet (81 mg total) by mouth every other day.  30 tablet  0  . benztropine (COGENTIN) 1 MG tablet Take 1 tablet (1 mg total) by mouth at bedtime.  30 tablet  0  . divalproex (DEPAKOTE) 500 MG DR tablet Take 2 tablets (1,000 mg total) by mouth at bedtime.  60 tablet  0  . risperiDONE (RISPERDAL) 2 MG tablet Take 1 tablet (2 mg total) by mouth at bedtime.  30 tablet  0  . risperiDONE microspheres (RISPERDAL CONSTA) 50 MG injection Inject 50 mg into the muscle every 14 (fourteen) days.       Marland Kitchen sertraline (ZOLOFT) 100 MG tablet Take 1 tablet (100 mg total) by mouth at bedtime.  30 tablet  0    Psychiatric Specialty Exam:     Blood pressure 119/81, pulse 90, temperature 98.1 F (36.7 C), temperature source Oral, resp. rate 16, height 5\' 6"  (1.676 m), weight  104.327 kg (230 lb), last menstrual period 04/19/2013, SpO2 98.00%.Body mass index is 37.14 kg/(m^2).  General Appearance: Distant  Eye Contact::  Poor  Speech:  Clear and  Coherent  Volume:  Normal  Mood:  Dysphoric  Affect:  Congruent  Thought Process:  Disorganized  Orientation:  Other:  oriented to person only  Thought Content:  Hallucinations: Visual-keeps bedroom light on at night to avoid and "not sure" if she hears voices  Suicidal Thoughts:  Yes.  without intent/plan  Homicidal Thoughts:  Yes.  without intent/plan  Memory:  Negative Recent;   Fair  Judgement:  Impaired  Insight:  Lacking  Psychomotor Activity:  Normal  Concentration:  Poor  Recall:  Fair  Akathisia:  NA  Handed:  Right  AIMS (if indicated):     Assets:  Financial Resources/Insurance  Sleep:      Treatment Plan Summary: Daily contact with patient to assess and evaluate symptoms and progress in treatment/Pt accepted pending 400 hall bed  Maryjean Morn E 05/04/2013 11:05 PM

## 2013-05-04 NOTE — BH Assessment (Signed)
Tele Assessment Note   [name] is an 42 y.o. female. She has presented as a walk in at [facility]. She states that she has not been feeling well the past two weeks and that she is having an off day and had an off day the other day. She reports losing her phone and but she is handling this issues. Her main stressor today appears to be her 56 year old mother, whom she lives with currently. She states she has been living with her mom since she left the group home a few months ago. She and her mother agreed to split the bills. From what she is describing, her mother appears to be asking her for additional funds, and she thinks it is for other family members. She reports she only receives $684 per month and her mother receives much more in [place]. She believes her mother is giving money to family members who may have substance abuse issues and she doesn't think it is right. She is looking for another place to stay. She reports she has found an apartment and believes she can make it work. But she states that she just can't take her mother "snapping" at her and feels like she has to defend herself. She reports that she is eating but not sleeping well, stating her mother keeps waking her up and she may have gotten an hour of sleep. However, she is well groomed, good eye contact and she looks like she has had a good sleep in the past 24 hours. No delusions or hallucinations noted. She reports feeling stressed because she has to run the kids to different types of practice and that her father was recently diagnosed with prostate cancer, and this has her worried. She also reports that she feels depressed and though she denies SI or HI currently, she is "just one or two steps away from being suicidal."  She reports she almost started to cry last night, but said she didn't. It appears the issue with her mother, along with the money and her father's illness is making her feel anxious and unsettled. She is due for another injection  at [name] this week. Spoke with Verne Spurr, PA-C; who does not feel the patient meets criteria for inpatient services at this time. Ms. Gainer signed a no harm contract. A plan was made with her to go to church tonight, have dinner with friends and to be with friends. She will then follow up with [facility] tomorrow. She was encouraged to ask for an ACT Team, as they can help support her, particularly with her transition to a new living arrangement. Patient articulated plan back to assessment counselor appropriately and accurately.   Axis I: Chronic Paranoid Schizophrenia; Depressive Disorder Axis II: Deferred Axis III: See past medical history Axis IV: family stressors, including the recent diagnosis of cancer with father; relationship conflict with mother; needs additional behavioral health outpatient support Axis V: GAF 60  Past Medical History:  Past Medical History  Diagnosis Date  . Anemia   . Asthma   . Hypertension   . Schizophrenic disorder   . Seizures   . Anxiety   . Depression   . Insomnia, persistent     Past Surgical History  Procedure Laterality Date  . Tonsillectomy      Family History: No family history on file.  Social History:  reports that she has quit smoking. She has never used smokeless tobacco. She reports that she does not drink alcohol or use illicit  drugs.  Additional Social History:     CIWA:   COWS:    Allergies:  Allergies  Allergen Reactions  . Haldol [Haloperidol Decanoate] Other (See Comments)    Stiffness, eyes bulging  . Penicillins Nausea And Vomiting  . Shrimp [Shellfish Allergy] Rash    Home Medications:  (Not in a hospital admission)  OB/GYN Status:  No LMP recorded.  General Assessment Data Location of Assessment: [facility] ACT Assessment: Yes Is this a Tele or Face-to-Face Assessment?: Face-to-Face Is this an Initial Assessment or a Re-assessment for this encounter?: Initial Assessment Living Arrangements:  Children;Parent Can pt return to current living arrangement?: Yes Admission Status: Voluntary Is patient capable of signing voluntary admission?: Yes Transfer from: Home Referral Source: Self/Family/Friend  Medical Screening Exam [facility] Walk-in ONLY) Medical Exam completed: No Reason for MSE not completed: Patient Refused  [facility] Crisis Care Plan Living Arrangements: Children;Parent Name of Psychiatrist:  Vesta Mixer)  Education Status Is patient currently in school?: No  Risk to self Suicidal Ideation: No Suicidal Intent: No Is patient at risk for suicide?: No Suicidal Plan?: No Access to Means: No What has been your use of drugs/alcohol within the last 12 months?:  (denies) Previous Attempts/Gestures: No Other Self Harm Risks: none noted (none) Intentional Self Injurious Behavior: None Family Suicide History: Unknown Recent stressful life event(s): Conflict (Comment);Financial Problems;Other (Comment) (father diagnosed with prostate cancer) Persecutory voices/beliefs?: No Depression: Yes Depression Symptoms: Insomnia;Isolating Substance abuse history and/or treatment for substance abuse?: No Suicide prevention information given to non-admitted patients: Yes  Risk to Others Homicidal Ideation: No Thoughts of Harm to Others: No Current Homicidal Intent: No Current Homicidal Plan: No Access to Homicidal Means: No History of harm to others?: No (denies) Assessment of Violence: None Noted Does patient have access to weapons?: No Criminal Charges Pending?: No  Psychosis Hallucinations: None noted Delusions: None noted  Mental Status Report Appear/Hygiene: Other (Comment) (appropriate dressed) Eye Contact: Good Motor Activity: Freedom of movement Speech: Logical/coherent;Rapid Level of Consciousness: Alert Mood: Anxious Affect: Appropriate to circumstance;Other (Comment) (intense, but appropriate) Anxiety Level: Minimal Thought Processes: Relevant Judgement:  Unimpaired Orientation: Person;Place;Time;Situation;Appropriate for developmental age Obsessive Compulsive Thoughts/Behaviors: None  Cognitive Functioning Concentration: Decreased Memory: Recent Intact;Remote Intact IQ: Average Insight: Good Impulse Control: Good Appetite: Good Sleep: Decreased Total Hours of Sleep:  (1) Vegetative Symptoms: None  ADLScreening [facility]) [name] cognitive ability adequate to safely complete daily activities?: Yes Patient able to express need for assistance with ADLs?: Yes Independently performs ADLs?: Yes (appropriate for developmental age)  Prior Inpatient Therapy Prior Inpatient Therapy: Yes Prior Therapy Dates:  (2013) Prior Therapy Facilty/Provider(s):  [facility]) Reason for Treatment:  (depression)  Prior Outpatient Therapy Prior Outpatient Therapy: Yes Prior Therapy Dates:  (cuurent) Prior Therapy Facilty/Provider(s):  [facility]) Reason for Treatment:  (receives medication management)  ADL Screening (condition at time of admission) [name] cognitive ability adequate to safely complete daily activities?: Yes Patient able to express need for assistance with ADLs?: Yes Independently performs ADLs?: Yes (appropriate for developmental age)                  Additional Information 1:1 In Past 12 Months?: No CIRT Risk: No Elopement Risk: No Does patient have medical clearance?: Yes     Disposition:  Disposition Initial Assessment Completed for this Encounter: Yes Disposition of Patient: Outpatient treatment Type of outpatient treatment: Adult  Referred to [facility]-current provider.   Shon Baton H 05/04/2013 3:45 PM

## 2013-05-04 NOTE — ED Notes (Signed)
Patient arrived to unit, no s/s of distress noted. Pt angry, irritable and labile. Pt refusing to speak with RN stating "I am done answering questions and I am only going to speak with the doctor". Pt then laid down in bed and put her covers over her head. Pt refusing to talk any further with RN.

## 2013-05-05 ENCOUNTER — Encounter (HOSPITAL_COMMUNITY): Payer: Self-pay

## 2013-05-05 ENCOUNTER — Inpatient Hospital Stay (HOSPITAL_COMMUNITY)
Admission: RE | Admit: 2013-05-05 | Discharge: 2013-05-12 | DRG: 885 | Disposition: A | Discharge status: Home / Self Care | Payer: Medicare Other | Attending: Psychiatry | Admitting: Psychiatry

## 2013-05-05 DIAGNOSIS — F411 Generalized anxiety disorder: Secondary | ICD-10-CM | POA: Diagnosis present

## 2013-05-05 DIAGNOSIS — F251 Schizoaffective disorder, depressive type: Secondary | ICD-10-CM | POA: Diagnosis present

## 2013-05-05 DIAGNOSIS — I10 Essential (primary) hypertension: Secondary | ICD-10-CM | POA: Diagnosis present

## 2013-05-05 DIAGNOSIS — F200 Paranoid schizophrenia: Secondary | ICD-10-CM

## 2013-05-05 DIAGNOSIS — F39 Unspecified mood [affective] disorder: Secondary | ICD-10-CM | POA: Diagnosis present

## 2013-05-05 DIAGNOSIS — F259 Schizoaffective disorder, unspecified: Principal | ICD-10-CM | POA: Diagnosis present

## 2013-05-05 DIAGNOSIS — R45851 Suicidal ideations: Secondary | ICD-10-CM

## 2013-05-05 DIAGNOSIS — G4700 Insomnia, unspecified: Secondary | ICD-10-CM | POA: Diagnosis present

## 2013-05-05 DIAGNOSIS — D649 Anemia, unspecified: Secondary | ICD-10-CM | POA: Diagnosis present

## 2013-05-05 DIAGNOSIS — F22 Delusional disorders: Secondary | ICD-10-CM | POA: Diagnosis present

## 2013-05-05 DIAGNOSIS — J45909 Unspecified asthma, uncomplicated: Secondary | ICD-10-CM | POA: Diagnosis present

## 2013-05-05 DIAGNOSIS — F29 Unspecified psychosis not due to a substance or known physiological condition: Secondary | ICD-10-CM

## 2013-05-05 MED ORDER — RISPERIDONE 2 MG PO TABS
2.0000 mg | ORAL_TABLET | Freq: Every day | ORAL | Status: DC
  Administered 2013-05-05 – 2013-05-08 (×4): 2 mg via ORAL
  Filled 2013-05-05 (×6): qty 1

## 2013-05-05 MED ORDER — ASPIRIN EC 81 MG PO TBEC
81.0000 mg | DELAYED_RELEASE_TABLET | ORAL | Status: DC
  Administered 2013-05-07 – 2013-05-11 (×2): 81 mg via ORAL
  Filled 2013-05-05 (×4): qty 1

## 2013-05-05 MED ORDER — ALUM & MAG HYDROXIDE-SIMETH 200-200-20 MG/5ML PO SUSP: 30.0000 mL | ORAL | Status: DC | PRN

## 2013-05-05 MED ORDER — DIVALPROEX SODIUM 500 MG PO DR TAB
1000.0000 mg | DELAYED_RELEASE_TABLET | Freq: Every day | ORAL | Status: DC
  Administered 2013-05-05 – 2013-05-11 (×7): 1000 mg via ORAL
  Filled 2013-05-05: qty 2
  Filled 2013-05-05: qty 14
  Filled 2013-05-05 (×9): qty 2

## 2013-05-05 MED ORDER — BENZTROPINE MESYLATE 1 MG PO TABS
1.0000 mg | ORAL_TABLET | Freq: Every day | ORAL | Status: DC
  Administered 2013-05-05 – 2013-05-11 (×7): 1 mg via ORAL
  Filled 2013-05-05 (×7): qty 1
  Filled 2013-05-05: qty 7
  Filled 2013-05-05 (×2): qty 1

## 2013-05-05 MED ORDER — SERTRALINE HCL 100 MG PO TABS
100.0000 mg | ORAL_TABLET | Freq: Every day | ORAL | Status: DC
  Administered 2013-05-05 – 2013-05-08 (×4): 100 mg via ORAL
  Filled 2013-05-05 (×6): qty 1

## 2013-05-05 MED ORDER — MAGNESIUM HYDROXIDE 400 MG/5ML PO SUSP: 30.0000 mL | Freq: Every day | ORAL | Status: DC | PRN

## 2013-05-05 MED ORDER — ACETAMINOPHEN 325 MG PO TABS: 650.0000 mg | ORAL_TABLET | Freq: Four times a day (QID) | ORAL | Status: DC | PRN

## 2013-05-05 NOTE — Consult Note (Signed)
Patient Identification:  [name] Date of Evaluation:  05/05/2013   History of Present Illness:  Patient informed this writer this am that she has been stressed due to her father's diagnosis of prostate cancer.  She is living with her mother and occasionally they get into arguments that causes her more stress.  She states lately she has been planning her funeral but denies feeling suicidal when she does that.  She also reports auditory hallucinations hearing voices down stairs at her mother's home while she is the only person in the house.  She states she is compliant with her medications and will like to be admitted for stabilization.  We will admit patient to our 400 unit hall when bed is available.  Past Psychiatric History:Chronic schizophrenia with acute psychosis    Past Medical History:     Past Medical History  Diagnosis Date  . Anemia   . Asthma   . Hypertension   . Schizophrenic disorder   . Seizures   . Anxiety   . Depression   . Insomnia, persistent   . Bipolar 1 disorder        Past Surgical History  Procedure Laterality Date  . Tonsillectomy      Allergies:  Allergies  Allergen Reactions  . Haldol [Haloperidol Decanoate] Other (See Comments)    Stiffness, eyes bulging  . Penicillins Nausea And Vomiting  . Shrimp [Shellfish Allergy] Rash    Current Medications:  Prior to Admission medications   Medication Sig Start Date End Date Taking? Authorizing Provider  aspirin EC 81 MG EC tablet Take 1 tablet (81 mg total) by mouth every other day. 06/28/12  Yes Mojeed Akintayo  benztropine (COGENTIN) 1 MG tablet Take 1 tablet (1 mg total) by mouth at bedtime. 06/28/12  Yes Mojeed Akintayo  divalproex (DEPAKOTE) 500 MG DR tablet Take 2 tablets (1,000 mg total) by mouth at bedtime. 06/28/12  Yes Mojeed Akintayo  risperiDONE (RISPERDAL) 2 MG tablet Take 1 tablet (2 mg total) by mouth at bedtime. 06/28/12  Yes Mojeed Akintayo  risperiDONE microspheres (RISPERDAL CONSTA)  50 MG injection Inject 50 mg into the muscle every 14 (fourteen) days.    Yes Historical Provider, MD  sertraline (ZOLOFT) 100 MG tablet Take 1 tablet (100 mg total) by mouth at bedtime. 06/28/12  Yes Mojeed Akintayo    Social History:    reports that she has quit smoking. She has never used smokeless tobacco. She reports that she does not drink alcohol or use illicit drugs.   Family History:    No family history on file.  Mental Status Examination/Evaluation:  AA female who appears her stated age was seen this am awake, alert and oriented x 3.   She is casually groomed, she was cooperative and willingly answered our questions.  Her attention and concentration was normal, her thought process and content is significant with auditory hallucination hearing voices of people talking.    She reports constantly planning her funeral.Her insight and judgement is fair.  She denied SI/HI /VH during this interview.   DIAGNOSIS:   AXIS I   Chronic schizophrenia with acute psychosis   AXIS II  Deffered  AXIS III See medical notes.  AXIS IV occupational problems, other psychosocial or environmental problems and problems related to social environment  AXIS V 41-50 serious symptoms     Assessment/Plan:  Consult and face to face interview with Dr Lolly Mustache We will admit to our 400 unit hall when bed is available We will resume  her home medications while in the [facility] We will administer her Risperdal Consta injection tomorrow after contacting [facility]  I have personally seen the patient and agreed with the findings and involved in the treatment plan. Kathryne Sharper, MD

## 2013-05-05 NOTE — BH Assessment (Signed)
Per Tanna Savoy at [facility] [place], pt has been accepted to [phone] by Maryjean Morn to Dr. Dub Mikes. Support paperwork faxed to [facility] and originals placed in pt's chart. RN notified.  Evette Cristal, [place] Assessment Counselor

## 2013-05-05 NOTE — ED Notes (Signed)
Patient has been discharged to [facility] with security.

## 2013-05-05 NOTE — Progress Notes (Signed)
Patient ID: [name], female   DOB: 1971-08-11, 42 y.o.   MRN: [id number]  Admission Note:  D:41 yr female who presents VC in no acute distress for the treatment of Psychosis, aggression and Depression. Pt appears flat and depressed. Pt was calm and cooperative with admission process. Pt presents with passive SI and contracts for safety upon admission. Pt denies AVH. Pt presents with some disorganized thoughts and flight of ideas. Pt states her mother still treats her like a 9 yr, because she dosen't let her do what she wants with her money.    A:Pt has Past medical Hx of Asthma, arthritis, seizures (last on in 1995) .Pt says she is on disability, but works at doing [employer]. Skin was assessed and found to be clear of any abnormal marks POC and unit policies explained and understanding verbalized. Consents obtained. Food and fluids offered, and  Accepted.  R: Pt had no additional questions or concerns.

## 2013-05-06 DIAGNOSIS — F259 Schizoaffective disorder, unspecified: Principal | ICD-10-CM

## 2013-05-06 NOTE — Consult Note (Signed)
Agree with plan 

## 2013-05-06 NOTE — Tx Team (Signed)
  Interdisciplinary Treatment Plan Update   Date Reviewed:  05/06/2013  Time Reviewed:  8:23 AM  Progress in Treatment:   Attending groups: Yes Participating in groups: Yes Taking medication as prescribed: Yes  Tolerating medication: Yes Family/Significant other contact made: No Patient understands diagnosis: No  Limited insight Discussing patient identified problems/goals with staff: Yes Medical problems stabilized or resolved: Yes Denies suicidal/homicidal ideation: No  States it "comes and goes" but appears in no distress and contracts for safety Patient has not harmed self or others: Yes  For review of initial/current patient goals, please see plan of care.  Estimated Length of Stay:  4-5 days  Reason for Continuation of Hospitalization: Depression Medication stabilization Other; describe Disorganized thoughts  New Problems/Goals identified:  N/A  Discharge Plan or Barriers:   plans to return to a previous [facility] placement  Additional Comments:  42 yr female who presents VC in no acute distress for the treatment of Psychosis, aggression and Depression. Pt appears flat and depressed. Pt was calm and cooperative with admission process. Pt presents with passive SI and contracts for safety upon admission. Pt denies AVH. Pt presents with some disorganized thoughts and flight of ideas. Pt states her mother still treats her like a 9 yr, because she dosen't let her do what she wants with her money.    Attendees:  Signature: [name], MD 05/06/2013 8:23 AM   Signature: Richelle Ito, LCSW 05/06/2013 8:23 AM  Signature: Fransisca Kaufmann, NP 05/06/2013 8:23 AM  Signature: Joslyn Devon, RN 05/06/2013 8:23 AM  Signature: Liborio Nixon, RN 05/06/2013 8:23 AM  Signature:  05/06/2013 8:23 AM  Signature:   05/06/2013 8:23 AM  Signature:    Signature:    Signature:    Signature:    Signature:    Signature:      Scribe for Treatment Team:   Richelle Ito, LCSW  05/06/2013 8:23 AM
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
## 2013-05-06 NOTE — Progress Notes (Signed)
Patient ID: [name], female   DOB: 1971/04/10, 42 y.o.   MRN: [id number]  D: Writer introduced herself to the pt. Asked pt the circumstances surrounding her readmit. Pt stated, "Just being [name]". Stated she's been trying to relive the past. When asked what she was trying to relive from the past, pt stated, " friends, circumstances and situations." However, pt stated she realizes she can't go back and change the past. States she plans to have the ACT team follow, and hopes she can find placement.  A:  Support and encouragement was offered. 15 min checks continued for safety.  R: Pt remains safe.

## 2013-05-06 NOTE — [facility] Group Notes (Signed)
[facility] LCSW Group Therapy  05/06/2013 , 2:03 PM   Type of Therapy:  Group Therapy  Participation Level:  Active  Participation Quality:  Attentive  Affect:  Appropriate  Cognitive:  Alert  Insight:  Limited  Engagement in Therapy: Limited  Modes of Intervention:  Discussion, Exploration and Socialization  Summary of Progress/Problems: Today's group focused on the term Diagnosis.  Participants were asked to define the term, and then pronounce whether it is a negative, positive or neutral term.  Starla participated in a disorganized, limited insight fashion.  She was quick to respond to questions, but did so by throwing out the first woerds that came to her mind-some related to the topic at hand, some not.  She responded to redirection and limits.  [name] 05/06/2013 , 2:03 PM

## 2013-05-06 NOTE — [facility] Suicide Risk Assessment (Signed)
Suicide Risk Assessment  Admission Assessment     Nursing information obtained from:  Patient Demographic factors:  Low socioeconomic status;Divorced or widowed Current Mental Status:  Self-harm thoughts Loss Factors:  Loss of significant relationship;Financial problems / change in socioeconomic status Historical Factors:  Prior suicide attempts Risk Reduction Factors:  Employed;Responsible for children under 41 years of age;Positive social support  CLINICAL FACTORS:   Schizophrenia:   Paranoid or undifferentiated type Currently Psychotic agitation  COGNITIVE FEATURES THAT CONTRIBUTE TO RISK:  Closed-mindedness Polarized thinking    SUICIDE RISK:   Minimal: No identifiable suicidal ideation.  Patients presenting with no risk factors but with morbid ruminations; may be classified as minimal risk based on the severity of the depressive symptoms  PLAN OF CARE:1. Admit for crisis management and stabilization. 2. Medication management to reduce current symptoms to base line and improve the     patient's overall level of functioning 3. Treat health problems as indicated. 4. Develop treatment plan to decrease risk of relapse upon discharge and the need for     readmission. 5. Psycho-social education regarding relapse prevention and self care. 6. Health care follow up as needed for medical problems. 7. Restart home medications where appropriate.  I certify that inpatient services furnished can reasonably be expected to improve the patient's condition.  Lydia Meng,MD 05/06/2013, 10:58 AM

## 2013-05-06 NOTE — [facility] Group Notes (Signed)
Adult Psychoeducational Group Note  Date:  05/06/2013 Time:  10:18 PM  Group Topic/Focus:  Wrap-Up Group:   The focus of this group is to help patients review their daily goal of treatment and discuss progress on daily workbooks.  Participation Level:  Minimal  Participation Quality:  Appropriate  Affect:  Appropriate  Cognitive:  Confused  Insight: Limited  Engagement in Group:  Limited  Modes of Intervention:  Discussion  Additional Comments:  Javeria stated her day was great.  She stated she started out thinking about recovery and waiting on a diagnosis.  She also said her day got better when she went outside to play basketball.  Caroll Rancher A 05/06/2013, 10:18 PM

## 2013-05-06 NOTE — Progress Notes (Addendum)
D: Pt presents with flat affect. Depressed mood. Disorganize thoughts. Tangential speech. Loose association. Pt passive SI, pt contracts for safety.  Pt stated that she did  not feel safe and was unaware of her surroundings. Pt now feels safe here at [facility]. Pt compliant with treatment. A:  Consult with MD and Clinical research associate. Verbal support given. Pt encouraged to attend groups. 15 minute checks performed for safety. R: Pt receptive to treatment. No complaints verbalized by pt.

## 2013-05-07 MED ORDER — DIPHENHYDRAMINE HCL 50 MG/ML IJ SOLN
50.0000 mg | Freq: Once | INTRAMUSCULAR | Status: AC
  Administered 2013-05-07: 50 mg via INTRAMUSCULAR
  Filled 2013-05-07 (×2): qty 1

## 2013-05-07 MED ORDER — RISPERIDONE MICROSPHERES 25 MG IM SUSR
50.0000 mg | INTRAMUSCULAR | Status: DC
  Administered 2013-05-07: 50 mg via INTRAMUSCULAR
  Filled 2013-05-07: qty 4

## 2013-05-07 NOTE — Progress Notes (Signed)
D: Pt presents with flat affect. Depressed mood. Pt rates depression 10/10. Pt continues to present with visual hallucinations , seeing shadows.  Pt paranoid and feels like people are after her in her room. A: Medications administered as ordered per MD. Verbal support given. Pt encouraged to attend groups. 15 minute check performed for safety. R: Pt receptive to treatment. Pt remains safe at this time.

## 2013-05-07 NOTE — [facility] Group Notes (Signed)
[facility] [employer] Group Therapy  05/07/2013 , 1:35 PM    Type of Therapy:  M[employer] Presentation  Participation Level:  Active  Participation Quality:  Attentive  Affect:  Blunted  Cognitive:  Oriented  Insight:  Limited  Engagement in Therapy:Limited  Modes of Intervention:  Discussion, Education and Socialization  Summary of Progress/Problems:  Onalee Hua from [employer] came to present his recovery story and play the guitar.  Tonya interrupted the speaker several times to interject unrelated comments.  She said she had a question at the end, but went on to make a long rambling statement.  Expressed appreciation for the presenter coming.  [name] 05/07/2013 , 1:35 PM

## 2013-05-07 NOTE — Progress Notes (Signed)
Adult Psychoeducational Group Note  Date: 05/07/2013  Time: 12:07 PM  Group Topic/Focus:  Personal Choices and Values: The focus of this group is to help patients assess and explore the importance of values in their lives, how their values affect their decisions, how they express their values and what opposes their expression.  Participation Level: Active  Participation Quality: Redirectable and Sharing  Affect: Appropriate  Cognitive: Appropriate  Insight: Appropriate  Engagement in Group: Engaged and Off Topic  Modes of Intervention: Discussion, Education and Support  Additional Comments:  Gracy Racer  05/07/2013, 12:07 PM

## 2013-05-07 NOTE — [facility] Counselor (Signed)
Adult Psychosocial Assessment Update Interdisciplinary Team  [facility] admissions/discharges:  Admissions Discharges  Date:10/13 Date:  Date: 6/12 Date:  Date: 5/12 Date:  Date: Date:  Date: Date:   Changes since the last Psychosocial Assessment (including adherence to outpatient mental health and/or substance abuse treatment, situational issues contributing to decompensation and/or relapse). Asked pt the circumstances surrounding her readmit. Pt stated, "Just being [name]". Stated she's been trying to relive the past. When asked what she was trying to relive from the past, pt stated, " friends, circumstances and situations."  [name] had been living in a group home since she left here last October until about a month and a half ago, when she returned home with her mother.  Circumstances as to why are unclear.  [name] ended up feeling like her mother was treating her like a child, decompensated, and ended up here.  She continues to work with [facility] ACT team, and is planning on returning to the [facility] post d/c.              Discharge Plan 1. Will you be returning to the same living situation after discharge?   Yes: No: X     If no, what is your plan?  See above           2. Would you like a referral for services when you are discharged? Yes:     If yes, for what services?  No:   X  With [facility] ACT           Summary and Recommendations (to be completed by the evaluator) [name] had a mental health set-back due to a return home, and stressers related to her relationship with her mother and her children.  She will be returning to the [facility] from here.  She can benefit from crises stabilization, medication management, therapeutic mileiu and referral for services.                       Signature:  Ida Rogue, 05/07/2013 1:37 PM

## 2013-05-07 NOTE — [facility] Group Notes (Signed)
[facility] LCSW Aftercare Discharge Planning Group Note   05/07/2013 11:16 AM  Participation Quality:  Engaged  Mood/Affect:  Blunted  Depression Rating:  denies  Anxiety Rating:  denies  Thoughts of Suicide:  No Will you contract for safety?   NA  Current AVH:  No  Plan for Discharge/Comments:  Ocia kicked off things by talking about her husband.  I know her well enough to know she is not married-so confronted her.  She stuck with it for awhile, but finally admitted that "I am talking about a long time ago."  I reminded her we are in 2014, and we need to focus on the here and now.  She confirmed that we are going with the plan of sending to [facility] to [facility].  Transportation Means: GH  Supports: GH  [name]

## 2013-05-07 NOTE — Progress Notes (Signed)
Patient ID: [name], female   DOB: September 12, 1970, 42 y.o.   MRN: [id number]  D: Pt was pleasant and more "upbeat" today than previous. However, pt had to be redirected while in the dayroom. Pt explained to the writer that she was "dancing, twerking, and backing it up".  Writer reminded pt that she's in the hosp and would need to be appropriate. Pt understood, she and writer began to discuss her plan. Stated she's not sure if she'll go to a group home or if she's going to continue pursuing a private residence. Stated that if she moves into a group home she'll lose her car, because she'll only be allowed to keep 65.00.  Pt plans to speak with the owner to see if arrangements can be made.  A:  Support and encouragement was offered. 15 min checks continued for safety.  R: Pt remains safe.

## 2013-05-07 NOTE — Progress Notes (Signed)
Recreation Therapy Notes  Date: 09.10.2014 Time: 9:30am Location: [address] dayroom  Group Topic: Leisure Education  Goal Area(s) Addresses:  Patient will verbalize activity of interest by end of group session. Patient will verbalize the ability to use positive leisure/recreation as a coping mechanism.  Behavioral Response: Did not attend. As group was beginning patient was asked to attend treatment team by RN. Patient did not return to session.   Marykay Lex Adileny Delon, LRT/CTRS  Jearl Klinefelter 05/07/2013 7:34 PM
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
## 2013-05-07 NOTE — Progress Notes (Signed)
[facility] MD Progress Note  05/07/2013 11:39 AM [name]  MRN:  [id number] Subjective: "I am still hearing voices, depressed, paranoid and seeing shadows." Objective: Patient reports ongoing psychosis, delusions, anxiety and depressed. She denies suicidal/homicidal ideations, intent or plan. Patient agreed to take her Risperdal Consta 50mg  IM today, the last time she got it was over 2 weeks ago. Patient is compliant with treatment offered to her and she has been attending groups. Diagnosis:   DSM5: Schizophrenia Disorders:  Delusional Disorder (297.1) and Schizophrenia (295.7) Obsessive-Compulsive Disorders:  none Trauma-Stressor Disorders:  none Substance/Addictive Disorders:  none Depressive Disorders:  Major Depressive Disorder - Moderate (296.22)  Axis I: Schizoaffective Disorder depressed type  ADL's:  Intact  Sleep: Fair  Appetite:  Fair  Suicidal Ideation: denies  Homicidal Ideation: denies  AEB (as evidenced by):  Psychiatric Specialty Exam: Review of Systems  Constitutional: Negative.   HENT: Negative.   Eyes: Negative.   Respiratory: Negative.   Cardiovascular: Negative.   Gastrointestinal: Negative.   Genitourinary: Negative.   Musculoskeletal: Negative.   Skin: Negative.   Neurological: Negative.   Endo/Heme/Allergies: Negative.   Psychiatric/Behavioral: Positive for hallucinations. The patient is nervous/anxious and has insomnia.     Blood pressure 123/89, pulse 124, temperature 98.1 F (36.7 C), temperature source Oral, resp. rate 20, height 5' 6.75" (1.695 m), weight 105.235 kg (232 lb), last menstrual period 04/19/2013.Body mass index is 36.63 kg/(m^2).  General Appearance: Fairly Groomed  Patent attorney::  Good  Speech:  Clear and Coherent  Volume:  Normal  Mood:  Irritable  Affect:  Constricted  Thought Process:  Disorganized  Orientation:  Full (Time, Place, and Person)  Thought Content:  Delusions and Hallucinations: Auditory  Suicidal Thoughts:  No   Homicidal Thoughts:  No  Memory:  Immediate;   Fair Recent;   Fair Remote;   Fair  Judgement:  Impaired  Insight:  Shallow  Psychomotor Activity:  Decreased  Concentration:  Fair  Recall:  Fair  Akathisia:  No  Handed:  Right  AIMS (if indicated):     Assets:  Desire for Improvement Social Support  Sleep:  Number of Hours: 6.25   Current Medications: Current Facility-Administered Medications  Medication Dose Route Frequency Provider Last Rate Last Dose  . acetaminophen (TYLENOL) tablet 650 mg  650 mg Oral Q6H PRN Earney Navy, NP      . alum & mag hydroxide-simeth (MAALOX/MYLANTA) 200-200-20 MG/5ML suspension 30 mL  30 mL Oral Q4H PRN Earney Navy, NP      . aspirin EC tablet 81 mg  81 mg Oral QODAY Earney Navy, NP   81 mg at 05/07/13 0740  . benztropine (COGENTIN) tablet 1 mg  1 mg Oral QHS Earney Navy, NP   1 mg at 05/06/13 2259  . diphenhydrAMINE (BENADRYL) injection 50 mg  50 mg Intramuscular Once Cassundra Mckeever      . divalproex (DEPAKOTE) DR tablet 1,000 mg  1,000 mg Oral QHS Earney Navy, NP   1,000 mg at 05/06/13 2259  . magnesium hydroxide (MILK OF MAGNESIA) suspension 30 mL  30 mL Oral Daily PRN Earney Navy, NP      . risperiDONE (RISPERDAL) tablet 2 mg  2 mg Oral QHS Earney Navy, NP   2 mg at 05/06/13 2259  . risperiDONE microspheres (RISPERDAL CONSTA) injection 50 mg  50 mg Intramuscular Q14 Days Sukhman Kocher      . sertraline (ZOLOFT) tablet 100 mg  100 mg Oral  QHS Earney Navy, NP   100 mg at 05/06/13 2259    Lab Results: No results found for this or any previous visit (from the past 48 hour(s)).  Physical Findings: AIMS: Facial and Oral Movements Muscles of Facial Expression: None, normal Lips and Perioral Area: None, normal Jaw: None, normal Tongue: None, normal,Extremity Movements Upper (arms, wrists, hands, fingers): None, normal Lower (legs, knees, ankles, toes): None, normal, Trunk Movements Neck,  shoulders, hips: None, normal, Overall Severity Severity of abnormal movements (highest score from questions above): None, normal Incapacitation due to abnormal movements: None, normal [name] awareness of abnormal movements (rate only patient's report): No Awareness, Dental Status Current problems with teeth and/or dentures?: Yes (cavity) Does patient usually wear dentures?: No  CIWA:  CIWA-Ar Total: 3 COWS:  COWS Total Score: 1  Treatment Plan Summary: Daily contact with patient to assess and evaluate symptoms and progress in treatment Medication management  Plan:1. Admit for crisis management and stabilization. 2. Medication management to reduce current symptoms to base line and improve the     patient's overall level of functioning 3. Treat health problems as indicated. 4. Develop treatment plan to decrease risk of relapse upon discharge and the need for     readmission. 5. Psycho-social education regarding relapse prevention and self care. 6. Health care follow up as needed for medical problems. 7. Restart home medications where appropriate. 8. Risperdal Consta 50mg  IM Q2wkly, 1st dose today 9. Benadryl 50mg  IM x 1 dose for EPS prevention. 10. Patient to continue other medication regimen.  Medical Decision Making Problem Points:  Established problem, stable/improving (1), Review of last therapy session (1) and Review of psycho-social stressors (1) Data Points:  Order Aims Assessment (2) Review of medication regiment & side effects (2) Review of new medications or change in dosage (2)  I certify that inpatient services furnished can reasonably be expected to improve the patient's condition.   Apolonia Ellwood,MD 05/07/2013, 11:39 AM

## 2013-05-08 NOTE — Progress Notes (Signed)
Patient ID: [name], female   DOB: 1970-09-29, 42 y.o.   MRN: [id number] [facility] MD Progress Note  05/08/2013 2:30 PM [name]  MRN:  [id number] Subjective: "I am still hearing voices and seeing shadows. I'm a little drowsy from medication. I got my Risperdal Consta shot yesterday."  Objective: Patient reports ongoing psychosis, delusions and anxiety. She remains disorganized in her thought processes. Her affect is flat and she appears depressed. Patient is pleasant during interactions but remains delusional such as believing that she is currently married.   Diagnosis:   DSM5: Schizophrenia Disorders:  Delusional Disorder (297.1) and Schizophrenia (295.7) Obsessive-Compulsive Disorders:  none Trauma-Stressor Disorders:  none Substance/Addictive Disorders:  none Depressive Disorders:  Major Depressive Disorder - Moderate (296.22)  Axis I: Schizoaffective Disorder depressed type  ADL's:  Intact  Sleep: Fair  Appetite:  Fair  Suicidal Ideation: denies  Homicidal Ideation: denies  AEB (as evidenced by):  Psychiatric Specialty Exam: Review of Systems  Constitutional: Negative.  Negative for fever, chills, weight loss, malaise/fatigue and diaphoresis.  HENT: Negative.  Negative for hearing loss, ear pain, congestion, neck pain, tinnitus and ear discharge.   Eyes: Negative.  Negative for blurred vision, double vision, photophobia, pain and discharge.  Respiratory: Negative.  Negative for cough, hemoptysis, sputum production and shortness of breath.   Cardiovascular: Negative.  Negative for chest pain, palpitations, orthopnea, claudication and leg swelling.  Gastrointestinal: Negative.  Negative for heartburn, nausea, vomiting, abdominal pain, diarrhea and constipation.  Genitourinary: Negative.  Negative for dysuria, urgency, frequency and hematuria.  Musculoskeletal: Negative.  Negative for myalgias, back pain and joint pain.  Skin: Negative.  Negative for itching and rash.   Neurological: Negative.  Negative for dizziness, tingling, tremors, sensory change, speech change, focal weakness and headaches.  Endo/Heme/Allergies: Negative.  Negative for environmental allergies. Does not bruise/bleed easily.  Psychiatric/Behavioral: Positive for depression and hallucinations. Negative for suicidal ideas, memory loss and substance abuse. The patient is nervous/anxious and has insomnia.     Blood pressure 130/76, pulse 109, temperature 98.2 F (36.8 C), temperature source Oral, resp. rate 17, height 5' 6.75" (1.695 m), weight 105.235 kg (232 lb), last menstrual period 04/19/2013.Body mass index is 36.63 kg/(m^2).  General Appearance: Fairly Groomed  Patent attorney::  Good  Speech:  Clear and Coherent  Volume:  Normal  Mood:  Irritable  Affect:  Constricted  Thought Process:  Disorganized  Orientation:  Full (Time, Place, and Person)  Thought Content:  Delusions and Hallucinations: Auditory  Suicidal Thoughts:  No  Homicidal Thoughts:  No  Memory:  Immediate;   Fair Recent;   Fair Remote;   Fair  Judgement:  Impaired  Insight:  Shallow  Psychomotor Activity:  Decreased  Concentration:  Fair  Recall:  Fair  Akathisia:  No  Handed:  Right  AIMS (if indicated):     Assets:  Desire for Improvement Social Support  Sleep:  Number of Hours: 4.75   Current Medications: Current Facility-Administered Medications  Medication Dose Route Frequency Provider Last Rate Last Dose  . acetaminophen (TYLENOL) tablet 650 mg  650 mg Oral Q6H PRN Earney Navy, NP      . alum & mag hydroxide-simeth (MAALOX/MYLANTA) 200-200-20 MG/5ML suspension 30 mL  30 mL Oral Q4H PRN Earney Navy, NP      . aspirin EC tablet 81 mg  81 mg Oral QODAY Earney Navy, NP   81 mg at 05/07/13 0740  . benztropine (COGENTIN) tablet 1 mg  1 mg Oral  QHS Earney Navy, NP   1 mg at 05/07/13 2302  . divalproex (DEPAKOTE) DR tablet 1,000 mg  1,000 mg Oral QHS Earney Navy, NP   1,000  mg at 05/07/13 2305  . magnesium hydroxide (MILK OF MAGNESIA) suspension 30 mL  30 mL Oral Daily PRN Earney Navy, NP      . risperiDONE (RISPERDAL) tablet 2 mg  2 mg Oral QHS Earney Navy, NP   2 mg at 05/07/13 2302  . risperiDONE microspheres (RISPERDAL CONSTA) injection 50 mg  50 mg Intramuscular Q14 Days Mojeed Akintayo   50 mg at 05/07/13 1437  . sertraline (ZOLOFT) tablet 100 mg  100 mg Oral QHS Earney Navy, NP   100 mg at 05/07/13 2301    Lab Results: No results found for this or any previous visit (from the past 48 hour(s)).  Physical Findings: AIMS: Facial and Oral Movements Muscles of Facial Expression: None, normal Lips and Perioral Area: None, normal Jaw: None, normal Tongue: None, normal,Extremity Movements Upper (arms, wrists, hands, fingers): None, normal Lower (legs, knees, ankles, toes): None, normal, Trunk Movements Neck, shoulders, hips: None, normal, Overall Severity Severity of abnormal movements (highest score from questions above): None, normal Incapacitation due to abnormal movements: None, normal [name] awareness of abnormal movements (rate only patient's report): No Awareness, Dental Status Current problems with teeth and/or dentures?: Yes (cavity) Does patient usually wear dentures?: No  CIWA:  CIWA-Ar Total: 3 COWS:  COWS Total Score: 1  Treatment Plan Summary: Daily contact with patient to assess and evaluate symptoms and progress in treatment Medication management  Plan:1. Continue crisis management and stabilization. 2. Medication management to reduce current symptoms to base line and improve the patient's overall level of functioning 3. Treat health problems as indicated. 4. Develop treatment plan to decrease risk of relapse upon discharge and the need for readmission. 5. Psycho-social education regarding relapse prevention and self care. 6. Health care follow up as needed for medical problems. 7. Continue Depakote 1,000 mg at  bedtime for mood stabiity. Continue Risperdal 2 mg at hs for psychosis. Continue Zoloft 100 mg hs for depressive symptoms.  8. Risperdal Consta 50mg  IM Q2wkly, 1st dose given yesterday  Medical Decision Making Problem Points:  Established problem, stable/improving (1), Review of last therapy session (1) and Review of psycho-social stressors (1) Data Points:  Order Aims Assessment (2) Review of medication regiment & side effects (2) Review of new medications or change in dosage (2)  I certify that inpatient services furnished can reasonably be expected to improve the patient's condition.   Fransisca Kaufmann, NP-C 05/08/2013, 2:30 PM

## 2013-05-08 NOTE — ED Provider Notes (Signed)
Medical screening examination/treatment/procedure(s) were performed by non-physician practitioner and as supervising physician I was immediately available for consultation/collaboration.    Ahmari Duerson R Jolyn Deshmukh, MD 05/08/13 1108 

## 2013-05-08 NOTE — Progress Notes (Signed)
D: Pt denies SI. Pt reports hearing voices and seeing shadows. Pt presents with flat affect, depressed mood and tangential speech. Pt compliant with treatment. Pt verbalized no reactions to the injection that she received yesterday. A: Verbal support given. Pt encouraged to attend groups. 15 minute checks performed for safety. R: Pt receptive to treatment.

## 2013-05-08 NOTE — Care Management Utilization Note (Signed)
   Per State Regulation 482.30  This chart was reviewed for necessity with respect to the patient's Admission/ Duration of stay.  Next review date: 05/12/13  Nicolasa Ducking RN, BSN

## 2013-05-08 NOTE — [facility] Suicide Risk Assessment (Signed)
[facility] INPATIENT:  Family/Significant Other Suicide Prevention Education  Suicide Prevention Education:  Patient Discharged to Other Healthcare Facility:  Suicide Prevention Education Not Provided: [name] will be living in the group home of [name] in [place] and follow up with [facility] ACT team.  The patient is discharging to another healthcare facility for continuation of treatment.  The patient's medical information, including suicide ideations and risk factors, are a part of the medical information shared with the receiving healthcare facility.  [name] 05/08/2013, 3:27 PM

## 2013-05-08 NOTE — [facility] Group Notes (Signed)
[facility] Group Notes:  (Counselor/Nursing/MHT/Case Management/Adjunct)  05/08/2013 1:15PM  Type of Therapy:  Group Therapy  Participation Level:  Active  Participation Quality:  Appropriate  Affect:  Flat  Cognitive:  Oriented  Insight:  Improving  Engagement in Group:  Limited  Engagement in Therapy:  Limited  Modes of Intervention:  Discussion, Exploration and Socialization  Summary of Progress/Problems: The topic for group was balance in life.  Pt participated in the discussion about when their life was in balance and out of balance and how this feels.  Pt discussed ways to get back in balance and short term goals they can work on to get where they want to be. 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  [name] stated that the church community and nature helps her to stay balanced.  She describes balance as a "vibrant day."     [name] 05/08/2013 1:09 PM

## 2013-05-09 MED ORDER — TRAZODONE HCL 50 MG PO TABS
50.0000 mg | ORAL_TABLET | Freq: Every evening | ORAL | Status: DC | PRN
  Administered 2013-05-10 – 2013-05-11 (×3): 50 mg via ORAL
  Filled 2013-05-09 (×2): qty 1

## 2013-05-09 MED ORDER — RISPERIDONE 1 MG PO TABS
1.0000 mg | ORAL_TABLET | Freq: Every day | ORAL | Status: DC
  Administered 2013-05-09 – 2013-05-10 (×2): 1 mg via ORAL
  Filled 2013-05-09 (×4): qty 1

## 2013-05-09 MED ORDER — RISPERIDONE 0.5 MG PO TABS: 0.5000 mg | ORAL_TABLET | Freq: Every day | ORAL | Status: DC

## 2013-05-09 MED ORDER — SERTRALINE HCL 100 MG PO TABS
100.0000 mg | ORAL_TABLET | Freq: Every day | ORAL | Status: DC
  Administered 2013-05-10 – 2013-05-12 (×3): 100 mg via ORAL
  Filled 2013-05-09 (×2): qty 1
  Filled 2013-05-09: qty 7
  Filled 2013-05-09 (×2): qty 1

## 2013-05-09 NOTE — Progress Notes (Signed)
Adult Psychoeducational Group Note  Date:  05/09/2013 Time:  9:28 PM  Group Topic/Focus:  Wrap-Up Group:   The focus of this group is to help patients review their daily goal of treatment and discuss progress on daily workbooks.  Participation Level:  Active  Participation Quality:  Appropriate  Affect:  Appropriate  Cognitive:  Appropriate and Disorganized  Insight: Appropriate  Engagement in Group:  Engaged  Modes of Intervention:  Support  Additional Comments:  Pt stated that her goal was for that when she left to be stay inside and be more constructive things. Resolving her anger issues and that the root of it comes from confusion and frustration but that she had learned a coping skill, key words and that has really been helping her since she has been here. Pt. Would go off topic at numerous pts and at some pts were undirecable.   Ashana Tullo 05/09/2013, 9:28 PM

## 2013-05-09 NOTE — Progress Notes (Signed)
Patient ID: [name], female   DOB: 09/23/1970, 42 y.o.   MRN: [id number] [facility] MD Progress Note  05/09/2013 9:37 AM [name]  MRN:  [id number] Subjective: "I am feeling tired, drowsy and  depressed, paranoid ." Objective: Patient reports decreased anxiety, depression and mood swings. However, she continues to report that she is seeing shadows and feeling paranoid. Patient is compliant with her medications and has not endorsed any adverse reactions. Diagnosis:   DSM5: Schizophrenia Disorders:  Delusional Disorder (297.1) and Schizophrenia (295.7) Obsessive-Compulsive Disorders:  none Trauma-Stressor Disorders:  none Substance/Addictive Disorders:  none Depressive Disorders:  Major Depressive Disorder - Moderate (296.22)  Axis I: Schizoaffective Disorder depressed type  ADL's:  Intact  Sleep: Fair  Appetite:  Fair  Suicidal Ideation: denies  Homicidal Ideation: denies  AEB (as evidenced by):  Psychiatric Specialty Exam: Review of Systems  Constitutional: Negative.   HENT: Negative.   Eyes: Negative.   Respiratory: Negative.   Cardiovascular: Negative.   Gastrointestinal: Negative.   Genitourinary: Negative.   Musculoskeletal: Negative.   Skin: Negative.   Neurological: Negative.   Endo/Heme/Allergies: Negative.   Psychiatric/Behavioral: Positive for hallucinations. The patient is nervous/anxious and has insomnia.     Blood pressure 116/84, pulse 98, temperature 99.7 F (37.6 C), temperature source Oral, resp. rate 18, height 5' 6.75" (1.695 m), weight 105.235 kg (232 lb), last menstrual period 04/19/2013.Body mass index is 36.63 kg/(m^2).  General Appearance: Fairly Groomed  Patent attorney::  Good  Speech:  Clear and Coherent  Volume:  Normal  Mood:  Irritable  Affect:  Constricted  Thought Process:  Disorganized  Orientation:  Full (Time, Place, and Person)  Thought Content:  Delusions and Hallucinations: Auditory  Suicidal Thoughts:  No  Homicidal Thoughts:   No  Memory:  Immediate;   Fair Recent;   Fair Remote;   Fair  Judgement:  Impaired  Insight:  Shallow  Psychomotor Activity:  Decreased  Concentration:  Fair  Recall:  Fair  Akathisia:  No  Handed:  Right  AIMS (if indicated):     Assets:  Desire for Improvement Social Support  Sleep:  Number of Hours: 6.75   Current Medications: Current Facility-Administered Medications  Medication Dose Route Frequency Provider Last Rate Last Dose  . acetaminophen (TYLENOL) tablet 650 mg  650 mg Oral Q6H PRN Earney Navy, NP      . alum & mag hydroxide-simeth (MAALOX/MYLANTA) 200-200-20 MG/5ML suspension 30 mL  30 mL Oral Q4H PRN Earney Navy, NP      . aspirin EC tablet 81 mg  81 mg Oral QODAY Earney Navy, NP   81 mg at 05/07/13 0740  . benztropine (COGENTIN) tablet 1 mg  1 mg Oral QHS Earney Navy, NP   1 mg at 05/08/13 2150  . divalproex (DEPAKOTE) DR tablet 1,000 mg  1,000 mg Oral QHS Earney Navy, NP   1,000 mg at 05/08/13 2150  . magnesium hydroxide (MILK OF MAGNESIA) suspension 30 mL  30 mL Oral Daily PRN Earney Navy, NP      . risperiDONE (RISPERDAL) tablet 0.5 mg  0.5 mg Oral QHS Wrigley Plasencia      . risperiDONE microspheres (RISPERDAL CONSTA) injection 50 mg  50 mg Intramuscular Q14 Days Sanna Porcaro   50 mg at 05/07/13 1437  . [START ON 05/10/2013] sertraline (ZOLOFT) tablet 100 mg  100 mg Oral Daily Fadumo Heng        Lab Results: No results found for this or  any previous visit (from the past 48 hour(s)).  Physical Findings: AIMS: Facial and Oral Movements Muscles of Facial Expression: None, normal Lips and Perioral Area: None, normal Jaw: None, normal Tongue: None, normal,Extremity Movements Upper (arms, wrists, hands, fingers): None, normal Lower (legs, knees, ankles, toes): None, normal, Trunk Movements Neck, shoulders, hips: None, normal, Overall Severity Severity of abnormal movements (highest score from questions above): None,  normal Incapacitation due to abnormal movements: None, normal [name] awareness of abnormal movements (rate only patient's report): No Awareness, Dental Status Current problems with teeth and/or dentures?: Yes (cavity) Does patient usually wear dentures?: No  CIWA:  CIWA-Ar Total: 3 COWS:  COWS Total Score: 1  Treatment Plan Summary: Daily contact with patient to assess and evaluate symptoms and progress in treatment Medication management  Plan:1. Admit for crisis management and stabilization. 2. Medication management to reduce current symptoms to base line and improve the     patient's overall level of functioning 3. Treat health problems as indicated. 4. Develop treatment plan to decrease risk of relapse upon discharge and the need for     readmission. 5. Psycho-social education regarding relapse prevention and self care. 6. Health care follow up as needed for medical problems. 7. Restart home medications where appropriate. 8. Decreased Risperdal to 1mg  po Qhs due to excessive sedation and drowsiness 9. Patient to continue other medication regimen.  Medical Decision Making Problem Points:  Established problem, stable/improving (1), Review of last therapy session (1) and Review of psycho-social stressors (1) Data Points:  Order Aims Assessment (2) Review of medication regiment & side effects (2) Review of new medications or change in dosage (2)  I certify that inpatient services furnished can reasonably be expected to improve the patient's condition.   Shari Natt,MD 05/09/2013, 9:37 AM

## 2013-05-09 NOTE — Tx Team (Signed)
  Interdisciplinary Treatment Plan Update   Date Reviewed:  05/09/2013  Time Reviewed:  7:53 AM  Progress in Treatment:   Attending groups: Yes Participating in groups: Yes Taking medication as prescribed: Yes  Tolerating medication: Yes Family/Significant other contact made: Yes  Patient understands diagnosis: Yes  Discussing patient identified problems/goals with staff: Yes Medical problems stabilized or resolved: Yes Denies suicidal/homicidal ideation: Yes  In tx team Patient has not harmed self or others: Yes  For review of initial/current patient goals, please see plan of care.  Estimated Length of Stay:  3-4 days  Reason for Continuation of Hospitalization: Medication stabilization Other; describe Disorganized, bizarre thinking  New Problems/Goals identified:  N/A  Discharge Plan or Barriers:   return to [facility], follow up outpt  Additional Comments:  [name]'s mood is good, and she is exhibiting no signs of mania.  Her thoughts continue bizarre and delusional.  Plan is to release on Monday to [facility].  Attendees:  Signature: [name], MD 05/09/2013 7:53 AM   Signature: Richelle Ito, LCSW 05/09/2013 7:53 AM  Signature: Fransisca Kaufmann, NP 05/09/2013 7:53 AM  Signature: Shelda Jakes, RN 05/09/2013 7:53 AM  Signature:  05/09/2013 7:53 AM  Signature:  05/09/2013 7:53 AM  Signature:   05/09/2013 7:53 AM  Signature:    Signature:    Signature:    Signature:    Signature:    Signature:      Scribe for Treatment Team:   Richelle Ito, LCSW  05/09/2013 7:53 AM

## 2013-05-09 NOTE — [facility] Group Notes (Signed)
[facility] LCSW Group Therapy  05/09/2013  1:05 PM  Type of Therapy:  Group therapy  Participation Level:  Active  Participation Quality:  Attentive  Affect:  Flat  Cognitive:  Oriented  Insight:  Limited  Engagement in Therapy:  Limited  Modes of Intervention:  Discussion, Socialization  Summary of Progress/Problems:  Chaplain was here to lead a group on themes of hope and courage. [name] states that hope is "eternal and faith, unseen."  She acknowledged how influential her faith is to her hope.  Per usual, she made several vaguely related comments that went on for quite a while.  However, she was able to be redirected.  [name] 05/09/2013 11:44 AM

## 2013-05-09 NOTE — Progress Notes (Signed)
Patient ID: [name], female   DOB: [birth year], 42 y.o.   MRN: [id number]  D: Pt denies SI/HI/AVH. Pt is pleasant and cooperative. PT stated "feeling good, my spirits, little anger one time, it was a flashback but I corrected myself".  A: Pt was offered support and encouragement. Pt was given scheduled medications. Pt was encourage to attend groups. Q 15 minute checks were done for safety.   R:Pt attends groups and interacts well with peers and staff. Pt is taking medication. Pt has no complaints at this time.Pt receptive to treatment and safety maintained on unit.

## 2013-05-09 NOTE — [facility] Group Notes (Signed)
[facility] LCSW Aftercare Discharge Planning Group Note   05/09/2013 7:54 AM  Participation Quality:  Engaged  Mood/Affect:  Flat  Depression Rating:  denies  Anxiety Rating:  denies  Thoughts of Suicide:  No Will you contract for safety?   NA  Current AVH:  Disorganized, bizarre  Plan for Discharge/Comments:  "I guess the store doesn't need me to mind it today.'  [name] continues with bizarre, delusional thinking.  Mood good.  Looking forward to going back to [facility] next week.  Transportation Means: GH  Supports: family, GH  Moro, Huey B

## 2013-05-09 NOTE — Progress Notes (Signed)
Adult Psychoeducational Group Note  Date:  05/09/2013 Time:  11:28 AM  Group Topic/Focus:  Early Warning Signs:   The focus of this group is to help patients identify signs or symptoms they exhibit before slipping into an unhealthy state or crisis.  Participation Level:  Active  Participation Quality:  Attentive and Sharing  Affect:  Depressed and Flat  Cognitive:  Alert and Disorganized  Insight: Lacking and Limited  Engagement in Group:  Engaged  Modes of Intervention:  Discussion, Socialization and Support  Additional Comments:  Pt came to group and shared, but at times the stories were disorganized and hard to follow.  Cathlean Cower 05/09/2013, 11:28 AM

## 2013-05-09 NOTE — Progress Notes (Signed)
D. Pt has been out in milieu and has been participating in various activities throughout the day today. Pt has spoken about how she has been feeling paranoid and depressed and also stated that she feels that she is not sleeping well at night. Pt thoughts are somewhat disorganized and is difficult to follow the flow of the conversation at times. Pt has received medications this evening without incident. A. Support and encouragement provided, medication education given. R. Pt verbalized understanding, safety maintained.

## 2013-05-10 NOTE — Progress Notes (Signed)
D. Pt has been up and has been active and visible in milieu and has been participating in various activities. Pt still reports feelings of paranoia and auditory hallucinations. Pt thought process is somewhat disorganized and tangential and has been receiving medications as scheduled without incident. A. Support and encouragement provided, medication education provided. R. Pt verbalized understanding, will continue to monitor.

## 2013-05-10 NOTE — Progress Notes (Signed)
Patient ID: [name], female   DOB: 09-Sep-1970, 42 y.o.   MRN: [id number]  [facility] MD Progress Note  05/10/2013 3:08 PM [name]  MRN:  [id number] Subjective: "I am feeling paranoid and still hearing voices. I am trying to distract myself by going to groups. I am not feeling as depressed."  Objective: Patient reports decreased anxiety, depression and mood swings. However, she continues to report that she is seeing shadows and feeling paranoid. Patient is compliant with her medications and has not endorsed any adverse reactions. The patient continues to have disorganized thoughts and at times her responses to questions are illogical.  Diagnosis:   DSM5: Schizophrenia Disorders:  Delusional Disorder (297.1) and Schizophrenia (295.7) Obsessive-Compulsive Disorders:  none Trauma-Stressor Disorders:  none Substance/Addictive Disorders:  none Depressive Disorders:  Major Depressive Disorder - Moderate (296.22)  Axis I: Schizoaffective Disorder depressed type  ADL's:  Intact  Sleep: Fair  Appetite:  Fair  Suicidal Ideation: denies  Homicidal Ideation: denies  AEB (as evidenced by):  Psychiatric Specialty Exam: Review of Systems  Constitutional: Negative.   HENT: Negative.   Eyes: Negative.   Respiratory: Negative.   Cardiovascular: Negative.   Gastrointestinal: Negative.   Genitourinary: Negative.   Musculoskeletal: Negative.   Skin: Negative.   Neurological: Negative.   Endo/Heme/Allergies: Negative.   Psychiatric/Behavioral: Positive for depression and hallucinations. Negative for suicidal ideas, memory loss and substance abuse. The patient is nervous/anxious and has insomnia.     Blood pressure 136/90, pulse 103, temperature 98.4 F (36.9 C), temperature source Oral, resp. rate 16, height 5' 6.75" (1.695 m), weight 105.235 kg (232 lb), last menstrual period 04/19/2013.Body mass index is 36.63 kg/(m^2).  General Appearance: Fairly Groomed  Patent attorney::  Good  Speech:   Clear and Coherent  Volume:  Normal  Mood:  Irritable  Affect:  Constricted  Thought Process:  Disorganized  Orientation:  Full (Time, Place, and Person)  Thought Content:  Delusions and Hallucinations: Auditory  Suicidal Thoughts:  No  Homicidal Thoughts:  No  Memory:  Immediate;   Fair Recent;   Fair Remote;   Fair  Judgement:  Impaired  Insight:  Shallow  Psychomotor Activity:  Decreased  Concentration:  Fair  Recall:  Fair  Akathisia:  No  Handed:  Right  AIMS (if indicated):     Assets:  Desire for Improvement Social Support  Sleep:  Number of Hours: 5   Current Medications: Current Facility-Administered Medications  Medication Dose Route Frequency Provider Last Rate Last Dose  . acetaminophen (TYLENOL) tablet 650 mg  650 mg Oral Q6H PRN Earney Navy, NP      . alum & mag hydroxide-simeth (MAALOX/MYLANTA) 200-200-20 MG/5ML suspension 30 mL  30 mL Oral Q4H PRN Earney Navy, NP      . aspirin EC tablet 81 mg  81 mg Oral QODAY Earney Navy, NP   81 mg at 05/07/13 0740  . benztropine (COGENTIN) tablet 1 mg  1 mg Oral QHS Earney Navy, NP   1 mg at 05/09/13 2114  . divalproex (DEPAKOTE) DR tablet 1,000 mg  1,000 mg Oral QHS Earney Navy, NP   1,000 mg at 05/09/13 2114  . magnesium hydroxide (MILK OF MAGNESIA) suspension 30 mL  30 mL Oral Daily PRN Earney Navy, NP      . risperiDONE (RISPERDAL) tablet 1 mg  1 mg Oral QHS Mojeed Akintayo   1 mg at 05/09/13 2114  . risperiDONE microspheres (RISPERDAL CONSTA) injection 50 mg  50 mg Intramuscular Q14 Days Mojeed Akintayo   50 mg at 05/07/13 1437  . sertraline (ZOLOFT) tablet 100 mg  100 mg Oral Daily Mojeed Akintayo   100 mg at 05/10/13 0748  . traZODone (DESYREL) tablet 50 mg  50 mg Oral QHS PRN,MR X 1 Court Joy, PA-C   50 mg at 05/10/13 0016    Lab Results: No results found for this or any previous visit (from the past 48 hour(s)).  Physical Findings: AIMS: Facial and Oral  Movements Muscles of Facial Expression: None, normal Lips and Perioral Area: None, normal Jaw: None, normal Tongue: None, normal,Extremity Movements Upper (arms, wrists, hands, fingers): None, normal Lower (legs, knees, ankles, toes): None, normal, Trunk Movements Neck, shoulders, hips: None, normal, Overall Severity Severity of abnormal movements (highest score from questions above): None, normal Incapacitation due to abnormal movements: None, normal [name] awareness of abnormal movements (rate only patient's report): No Awareness, Dental Status Current problems with teeth and/or dentures?: Yes Does patient usually wear dentures?: No  CIWA:  CIWA-Ar Total: 3 COWS:  COWS Total Score: 1  Treatment Plan Summary: Daily contact with patient to assess and evaluate symptoms and progress in treatment Medication management  Plan:1. Continue crisis management and stabilization. 2. Medication management to reduce current symptoms to base line and improve the  patient's overall level of functioning 3. Treat health problems as indicated. 4. Develop treatment plan to decrease risk of relapse upon discharge and the need for  readmission. 5. Psycho-social education regarding relapse prevention and self care. 6. Health care follow up as needed for medical problems. 7. Restart home medications where appropriate. 8. Continue Risperdal to 1mg  po Qhs for psychosis. Continue Depakote for mood stability and Zoloft for depressive symptoms.  9. Patient to continue other medication regimen.  Medical Decision Making Problem Points:  Established problem, stable/improving (1), Review of last therapy session (1) and Review of psycho-social stressors (1) Data Points:  Order Aims Assessment (2) Review of medication regiment & side effects (2) Review of new medications or change in dosage (2)  I certify that inpatient services furnished can reasonably be expected to improve the patient's condition.   Fransisca Kaufmann, NP-C 05/10/2013, 3:08 PM  Reviewed note, agree with findings and plan, will consider increase in Oral Riperidone if there is continued psychosis.  Jacqulyn Cane, M.D.  05/10/2013 11:11 PM

## 2013-05-10 NOTE — [facility] Group Notes (Signed)
[facility] Group Notes:  (Clinical Social Work)  05/10/2013  11:15-11:45AM  Summary of Progress/Problems:   The main focus of today's process group was for the patient to identify ways in which they have in the past sabotaged their own recovery and reasons they may have done this/what they received from doing it.  We then worked to identify a specific plan to avoid doing this when discharged from the hospital for this admission.  The patient expressed that she self-sabotages by becoming easily offended.  She discussed the steps she is taking currently to take a breath and step back before responding in offense.  She stated several times that she feels her thinking abilities are considerably slowed by being on medications.  CSW and others gave her feedback that her thinking does not, to an outsider, have the appearance of being slow or disjointed.  Type of Therapy:  Group Therapy - Process  Participation Level:  Active  Participation Quality:  Attentive and Sharing  Affect:  Blunted and Defensive  Cognitive:  Oriented  Insight:  Developing/Improving  Engagement in Therapy:  Developing/Improving  Modes of Intervention:  Clarification, Education, Exploration, Discussion  Yvette Mantle, LCSW 05/10/2013, 12:47 PM

## 2013-05-10 NOTE — Progress Notes (Signed)
The focus of this group is to help patients review their daily goal of treatment and discuss progress on daily workbooks. Pt attended the evening group session and responded to discussion prompts from the Writer. Pt angrily shared that she felt someone had been writing lies about her sexual partners in her chart and became agitated speaking about this. She eventually began to yell and was unresponsive to redirection. "When I find whatever little girl has been writing lies about me, I'm going to drag her out into the street and beat her ass." Pt said that she was scheduled to be discharged Monday but that she did not want to leave. Pt's affect was angry.

## 2013-05-10 NOTE — Progress Notes (Signed)
Patient ID: Wilhelmine Krogstad, female   DOB: 07-25-1971, 42 y.o.   MRN: [id number] Psychoeducational Group Note  Date:  05/10/2013 Time:0930am  Group Topic/Focus:  Identifying Needs:   The focus of this group is to help patients identify their personal needs that have been historically problematic and identify healthy behaviors to address their needs.  Participation Level:  Active  Participation Quality:  Monopolizing  Affect:  Anxious  Cognitive:  Appropriate  Insight:  Monopolizing  Engagement in Group:  Monopolizing  Additional Comments:  Inventory and Psychoeducational group   Valente David 05/10/2013,10:09 AM

## 2013-05-10 NOTE — Progress Notes (Signed)
Pt paranoid, delusional believing that a staff member has written in her chart that she and her roommate here at [facility] are sharing a sexual partner. "We don't know each other like that." "You know, you all are here and you leave but our charts stay with [place]." Attempted to reassure patient that this writer had seen no mention of such documentation in her chart but pt remained convinced. Allowed her to vent feelings and pt began to settle down. Medicated per orders without difficulty. She denies SI/HI/AH but endorses VH. Pt safe resting in bed at this time. Lawrence Marseilles

## 2013-05-11 MED ORDER — RISPERIDONE 2 MG PO TABS
2.0000 mg | ORAL_TABLET | Freq: Every day | ORAL | Status: DC
  Administered 2013-05-11: 2 mg via ORAL
  Filled 2013-05-11: qty 7
  Filled 2013-05-11 (×2): qty 1

## 2013-05-11 NOTE — Progress Notes (Signed)
D- Patient continues to c/o voices but denies SI//HI. Attending groups with good participation.  Compliant with medications. Rates depression at 3 and hopelessness at 4.  Denies SI.  Reports good appetite but low energy. A- Support and encouragement given.  Continue with current POC and evaluation of treatment goals.  Continue 15' checks for safety.  R- Safety maintained.

## 2013-05-11 NOTE — Progress Notes (Signed)
Patient ID: [name], female   DOB: [birth year], 42 y.o.   MRN: [id number]  [facility] MD Progress Note  05/11/2013 1:35 PM [name]  MRN:  [id number] Subjective: "I am feeling paranoid and still hearing voices. Last night I thought someone was writing false things in my chart and I got upset. But it's all over now. I also hearing some banging last night but when I looked over my roommate was asleep. It's like somebody was going to battle. I guess it was in my head."  Objective: Patient reports decreased anxiety, depression and mood swings. However, she continues to report that she is hearing voices and feeling paranoid. Patient is compliant with her medications and has not endorsed any adverse reactions. The patient continues to have disorganized thoughts and at times her responses to questions are illogical.  Diagnosis:   DSM5: Schizophrenia Disorders:  Delusional Disorder (297.1) and Schizophrenia (295.7) Obsessive-Compulsive Disorders:  none Trauma-Stressor Disorders:  none Substance/Addictive Disorders:  none Depressive Disorders:  Major Depressive Disorder - Moderate (296.22)  Axis I: Schizoaffective Disorder depressed type  ADL's:  Intact  Sleep: Fair  Appetite:  Fair  Suicidal Ideation: denies  Homicidal Ideation: denies  AEB (as evidenced by):  Psychiatric Specialty Exam: Review of Systems  Constitutional: Negative.   HENT: Negative.   Eyes: Negative.   Respiratory: Negative.   Cardiovascular: Negative.   Gastrointestinal: Negative.   Genitourinary: Negative.   Musculoskeletal: Negative.   Skin: Negative.   Neurological: Negative.   Endo/Heme/Allergies: Negative.   Psychiatric/Behavioral: Positive for depression and hallucinations. Negative for suicidal ideas, memory loss and substance abuse. The patient is nervous/anxious and has insomnia.     Blood pressure 117/85, pulse 96, temperature 98.1 F (36.7 C), temperature source Oral, resp. rate 17, height 5' 6.75"  (1.695 m), weight 105.235 kg (232 lb), last menstrual period 04/19/2013.Body mass index is 36.63 kg/(m^2).  General Appearance: Fairly Groomed  Patent attorney::  Good  Speech:  Clear and Coherent  Volume:  Normal  Mood:  Irritable  Affect:  Constricted  Thought Process:  Disorganized  Orientation:  Full (Time, Place, and Person)  Thought Content:  Delusions and Hallucinations: Auditory  Suicidal Thoughts:  No  Homicidal Thoughts:  No  Memory:  Immediate;   Fair Recent;   Fair Remote;   Fair  Judgement:  Impaired  Insight:  Shallow  Psychomotor Activity:  Decreased  Concentration:  Fair  Recall:  Fair  Akathisia:  No  Handed:  Right  AIMS (if indicated):     Assets:  Desire for Improvement Social Support  Sleep:  Number of Hours: 6.75   Current Medications: Current Facility-Administered Medications  Medication Dose Route Frequency Provider Last Rate Last Dose  . acetaminophen (TYLENOL) tablet 650 mg  650 mg Oral Q6H PRN Earney Navy, NP      . alum & mag hydroxide-simeth (MAALOX/MYLANTA) 200-200-20 MG/5ML suspension 30 mL  30 mL Oral Q4H PRN Earney Navy, NP      . aspirin EC tablet 81 mg  81 mg Oral QODAY Earney Navy, NP   81 mg at 05/11/13 0802  . benztropine (COGENTIN) tablet 1 mg  1 mg Oral QHS Earney Navy, NP   1 mg at 05/10/13 2120  . divalproex (DEPAKOTE) DR tablet 1,000 mg  1,000 mg Oral QHS Earney Navy, NP   1,000 mg at 05/10/13 2120  . magnesium hydroxide (MILK OF MAGNESIA) suspension 30 mL  30 mL Oral Daily PRN Earney Navy,  NP      . risperiDONE (RISPERDAL) tablet 1 mg  1 mg Oral QHS Mojeed Akintayo   1 mg at 05/10/13 2120  . risperiDONE microspheres (RISPERDAL CONSTA) injection 50 mg  50 mg Intramuscular Q14 Days Mojeed Akintayo   50 mg at 05/07/13 1437  . sertraline (ZOLOFT) tablet 100 mg  100 mg Oral Daily Mojeed Akintayo   100 mg at 05/11/13 0802  . traZODone (DESYREL) tablet 50 mg  50 mg Oral QHS PRN,MR X 1 Court Joy,  PA-C   50 mg at 05/10/13 0016    Lab Results: No results found for this or any previous visit (from the past 48 hour(s)).  Physical Findings: AIMS: Facial and Oral Movements Muscles of Facial Expression: None, normal Lips and Perioral Area: None, normal Jaw: None, normal Tongue: None, normal,Extremity Movements Upper (arms, wrists, hands, fingers): None, normal Lower (legs, knees, ankles, toes): None, normal, Trunk Movements Neck, shoulders, hips: None, normal, Overall Severity Severity of abnormal movements (highest score from questions above): None, normal Incapacitation due to abnormal movements: None, normal [name] awareness of abnormal movements (rate only patient's report): No Awareness, Dental Status Current problems with teeth and/or dentures?: Yes Does patient usually wear dentures?: No  CIWA:  CIWA-Ar Total: 3 COWS:  COWS Total Score: 1  Treatment Plan Summary: Daily contact with patient to assess and evaluate symptoms and progress in treatment Medication management  Plan:1. Continue crisis management and stabilization. 2. Medication management to reduce current symptoms to base line and improve the  patient's overall level of functioning 3. Treat health problems as indicated. 4. Develop treatment plan to decrease risk of relapse upon discharge and the need for  readmission. 5. Psycho-social education regarding relapse prevention and self care. 6. Health care follow up as needed for medical problems. 7. Restart home medications where appropriate. 8. Increase Risperdal to 2mg  po Qhs for psychosis. Continue Depakote for mood stability and Zoloft for depressive symptoms.  9. Patient to continue other medication regimen.  Medical Decision Making Problem Points:  Established problem, stable/improving (1), Review of last therapy session (1) and Review of psycho-social stressors (1) Data Points:  Order Aims Assessment (2) Review of medication regiment & side effects  (2) Review of new medications or change in dosage (2)  I certify that inpatient services furnished can reasonably be expected to improve the patient's condition.   Fransisca Kaufmann, NP-C 05/11/2013, 1:35 PM  Reviewed note, agree with findings and plan.  Jacqulyn Cane, M.D.  05/12/2013 5:23 AM

## 2013-05-11 NOTE — [facility] Group Notes (Signed)
[facility] Group Notes:  (Clinical Social Work)  05/11/2013   11:15am-12:00pm  Summary of Progress/Problems:  The main focus of today's process group was to listen to a variety of genres of music and to identify that different types of music provoke different responses.  The patient then was able to identify personally what was soothing for them, as well as energizing.  Handouts were used to record feelings evoked, as well as how patient can personally use this knowledge in sleep habits, with depression, and with other symptoms.  The patient expressed understanding of how each type of music affected her, but NOT how this can be used at home as a recovery tool.  Type of Therapy:  Music Therapy   Participation Level:  Active  Participation Quality:  Attentive and Sharing  Affect:  Blunted, Irritable  Cognitive:  Disorganized  Insight:  Developing, Improving  Engagement in Therapy:  Engaged  Modes of Intervention:   Activity, Exploration  Ambrose Mantle, LCSW 05/11/2013, 1:10 PM

## 2013-05-11 NOTE — [facility] Group Notes (Signed)
[facility] Group Notes:  (Nursing/MHT/Case Management/Adjunct)  Date:  05/11/2013  Time:  10:54 AM  Type of Therapy:  Nurse Education  Participation Level:  Active  Participation Quality:  Appropriate and Attentive  Affect:  Appropriate  Cognitive:  Alert and Appropriate  Insight:  Good  Engagement in Group:  Developing/Improving  Modes of Intervention:  Problem-solving  Summary of Progress/Problems:  Cresenciano Lick 05/11/2013, 10:54 AM

## 2013-05-11 NOTE — Progress Notes (Signed)
Pt has done well tonight. She acknowledges that her thinking last night was paranoid and realizes her perceptions were inaccurate. She is also denying hallucinations tonight. Appropriate and sharing in group. No agitation this evening. Pt supported, encouraged. She is anxious and required both doses of trazadone prn. Denies SI/HI and remains safe. Lawrence Marseilles

## 2013-05-12 LAB — VALPROIC ACID LEVEL: Valproic Acid Lvl: 91.6 ug/mL (ref 50.0–100.0)

## 2013-05-12 MED ORDER — BENZTROPINE MESYLATE 1 MG PO TABS: 1.0000 mg | ORAL_TABLET | Freq: Every day | ORAL | Status: DC

## 2013-05-12 MED ORDER — DIVALPROEX SODIUM 500 MG PO DR TAB: 1000.0000 mg | DELAYED_RELEASE_TABLET | Freq: Every day | ORAL | Status: DC

## 2013-05-12 MED ORDER — RISPERIDONE 2 MG PO TABS: 2.0000 mg | ORAL_TABLET | Freq: Every day | ORAL | Status: DC

## 2013-05-12 MED ORDER — SERTRALINE HCL 100 MG PO TABS: 100.0000 mg | ORAL_TABLET | Freq: Every day | ORAL | Status: DC

## 2013-05-12 MED ORDER — RISPERIDONE MICROSPHERES 50 MG IM SUSR: 50.0000 mg | INTRAMUSCULAR | Status: DC

## 2013-05-12 NOTE — Tx Team (Signed)
  Interdisciplinary Treatment Plan Update   Date Reviewed:  05/12/2013  Time Reviewed:  10:45 AM  Progress in Treatment:   Attending groups: Yes Participating in groups: Yes Taking medication as prescribed: Yes  Tolerating medication: Yes Family/Significant other contact made: Yes  Patient understands diagnosis: Yes  Discussing patient identified problems/goals with staff: Yes Medical problems stabilized or resolved: Yes Denies suicidal/homicidal ideation: Yes Patient has not harmed self or others: Yes  For review of initial/current patient goals, please see plan of care.  Estimated Length of Stay:  D/C today  Reason for Continuation of Hospitalization:   New Problems/Goals identified:  N/A  Discharge Plan or Barriers:   Go to Group Home, follow up with ACT team  Additional Comments:  Attendees:  Signature: [name], MD 05/12/2013 10:45 AM   Signature: Richelle Ito, LCSW 05/12/2013 10:45 AM  Signature: Fransisca Kaufmann, NP 05/12/2013 10:45 AM  Signature: Joslyn Devon, RN 05/12/2013 10:45 AM  Signature: Liborio Nixon, RN 05/12/2013 10:45 AM  Signature:  05/12/2013 10:45 AM  Signature:   05/12/2013 10:45 AM  Signature:    Signature:    Signature:    Signature:    Signature:    Signature:      Scribe for Treatment Team:   Richelle Ito, LCSW  05/12/2013 10:45 AM

## 2013-05-12 NOTE — [facility] Suicide Risk Assessment (Signed)
Suicide Risk Assessment  Discharge Assessment     Demographic Factors:  Low socioeconomic status, Unemployed and Female  Mental Status Per Nursing Assessment::   On Admission:  Self-harm thoughts  Current Mental Status by Physician: patient denies suicidal ideations, intent or plan  Loss Factors: Decrease in vocational status and Financial problems/change in socioeconomic status  Historical Factors: Family history of mental illness or substance abuse and Impulsivity  Risk Reduction Factors:   Sense of responsibility to family, Living with another person, especially a relative and Positive social support  Continued Clinical Symptoms:  Resolving delusions and psychosis  Cognitive Features That Contribute To Risk:  Closed-mindedness Polarized thinking    Suicide Risk:  Minimal: No identifiable suicidal ideation.  Patients presenting with no risk factors but with morbid ruminations; may be classified as minimal risk based on the severity of the depressive symptoms  Discharge Diagnoses:   AXIS I:  Schizoaffective disorder, depressive type  AXIS II:  Deferred AXIS III:   Past Medical History  Diagnosis Date  . Anemia   . Asthma   . Hypertension   . Seizures   . Insomnia, persistent    AXIS IV:  economic problems, other psychosocial or environmental problems and problems related to social environment AXIS V:  61-70 mild symptoms  Plan Of Care/Follow-up recommendations:  Activity:  as tolerated Diet:  healthy Tests:  Valproic acid: 91.6 Other:  patient to keep her after care appointment  Is patient on multiple antipsychotic therapies at discharge:  No   Has Patient had three or more failed trials of antipsychotic monotherapy by history:  No  Recommended Plan for Multiple Antipsychotic Therapies: N/A  Yvette Koch,MD 05/12/2013, 9:48 AM

## 2013-05-12 NOTE — Discharge Summary (Signed)
Physician Discharge Summary Note  Patient:  [name] is an 42 y.o., female MRN:  [id number] DOB:  [birth year] Patient phone:  [phone] (home)  Patient address:   [address] [place] [address],   Date of Admission:  05/05/2013 Date of Discharge: 05/12/2013   Reason for Admission:  psychosis  Discharge Diagnoses: Principal Problem:   Schizoaffective disorder, depressive type  Review of Systems  Constitutional: Negative.  Negative for fever, chills, weight loss, malaise/fatigue and diaphoresis.  HENT: Negative for congestion and sore throat.   Eyes: Negative for blurred vision, double vision and photophobia.  Respiratory: Negative for cough, shortness of breath and wheezing.   Cardiovascular: Negative for chest pain, palpitations and PND.  Gastrointestinal: Negative for heartburn, nausea, vomiting, abdominal pain, diarrhea and constipation.  Musculoskeletal: Negative for myalgias, joint pain and falls.  Neurological: Negative for dizziness, tingling, tremors, sensory change, speech change, focal weakness, seizures, loss of consciousness, weakness and headaches.  Endo/Heme/Allergies: Negative for polydipsia. Does not bruise/bleed easily.  Psychiatric/Behavioral: Negative for depression, suicidal ideas, hallucinations, memory loss and substance abuse. The patient is not nervous/anxious and does not have insomnia.     DSM5: Axis Diagnosis:  Assessment:  AXIS I: Schizoaffective Disorder-Depressed type  AXIS II: Deferred  AXIS III:  Past Medical History   Diagnosis  Date    Anemia    Asthma    Hypertension    Seizures             AXIS IV: economic problems, other psychosocial or environmental problems and problems related to social environment  AXIS V: 11-20 some danger of hurting self or others possible OR occasionally fails to maintain minimal personal hygiene OR gross impairment in communication   Level of Care:  OP  Hospital Course:  Keia Rask was  admitted after coming to the [facility] where she stated that she was very stressed and then reported SI but later denied SI. She was agitated and confused. After assessment she was felt to be in need of acute psychiatric hospitalization and admitted to [facility] for stabilization and crisis management.     Kynslei Art was seen and evaluated by the Psychiatrist, and symptoms were identified. Medication management was initiated and the patient was encouraged to participate in unit programming.      She was evaluated each day by a clinical provider to assess her response to treatment. Improvement was noted by improved mood, cognition, sleep and appetite as well as the patient's report of reduced symptoms.       She responded well to a supportive therapeutic environment and medication management. Lillie denied side effects to the medication and felt that the groups were beneficial.  By the day of discharge she was in much improved condition than upon arrival. She denied SI/HI and stated no AVH.        Juliane was discharged out with plans to follow up as noted below. She was given samples of her medication and a 1 month prescription.  Mental Status Exam:  For mental status exam please see mental status exam and  suicide risk assessment completed by attending physician prior to discharge.   Consults:  None  Significant Diagnostic Studies:  None  Discharge Vitals:   Blood pressure 117/85, pulse 97, temperature 97.6 F (36.4 C), temperature source Oral, resp. rate 18, height 5' 6.75" (1.695 m), weight 105.235 kg (232 lb), last menstrual period 04/19/2013. Body mass index is 36.63 kg/(m^2). Lab Results:   Results for orders placed during the  hospital encounter of 05/05/13 (from the past 72 hour(s))  VALPROIC ACID LEVEL     Status: None   Collection Time    05/12/13  6:25 AM      Result Value Range   Valproic Acid Lvl 91.6  50.0 - 100.0 ug/mL   Comment: Performed at [facility]    Physical  Findings: AIMS: Facial and Oral Movements Muscles of Facial Expression: None, normal Lips and Perioral Area: None, normal Jaw: None, normal Tongue: None, normal,Extremity Movements Upper (arms, wrists, hands, fingers): None, normal Lower (legs, knees, ankles, toes): None, normal, Trunk Movements Neck, shoulders, hips: None, normal, Overall Severity Severity of abnormal movements (highest score from questions above): None, normal Incapacitation due to abnormal movements: None, normal [name] awareness of abnormal movements (rate only patient's report): No Awareness, Dental Status Current problems with teeth and/or dentures?: Yes Does patient usually wear dentures?: No  CIWA:  CIWA-Ar Total: 3 COWS:  COWS Total Score: 1  Psychiatric Specialty Exam: See Psychiatric Specialty Exam and Suicide Risk Assessment completed by Attending Physician prior to discharge.  Discharge destination:  Home  Is patient on multiple antipsychotic therapies at discharge:  No   Has Patient had three or more failed trials of antipsychotic monotherapy by history:  No  Recommended Plan for Multiple Antipsychotic Therapies: NA  Discharge Orders   Future Orders Complete By Expires   Diet - low sodium heart healthy  As directed    Discharge instructions  As directed    Comments:     Take all of your medications as directed. Be sure to keep all of your follow up appointments.  If you are unable to keep your follow up appointment, call your Doctor's office to let them know, and reschedule.  Make sure that you have enough medication to last until your appointment. Be sure to get plenty of rest. Going to bed at the same time each night will help. Try to avoid sleeping during the day.  Increase your activity as tolerated. Regular exercise will help you to sleep better and improve your mental health. Eating a heart healthy diet is recommended. Try to avoid salty or fried foods. Be sure to avoid all alcohol and  illegal drugs.   Increase activity slowly  As directed        Medication List    STOP taking these medications       aspirin 81 MG EC tablet      TAKE these medications     Indication   benztropine 1 MG tablet  Commonly known as:  COGENTIN  Take 1 tablet (1 mg total) by mouth at bedtime. For prevention of EPS.   Indication:  Extrapyramidal Reaction caused by Medications     divalproex 500 MG DR tablet  Commonly known as:  DEPAKOTE  Take 2 tablets (1,000 mg total) by mouth at bedtime. For mood stabilization.   Indication:  Depressive Phase of Manic-Depression     risperiDONE 2 MG tablet  Commonly known as:  RISPERDAL  Take 1 tablet (2 mg total) by mouth at bedtime. For psychosis.   Indication:  Manic-Depression     risperiDONE microspheres 50 MG injection  Commonly known as:  RISPERDAL CONSTA  Inject 2 mLs (50 mg total) into the muscle every 14 (fourteen) days. For psychosis and mood stabilization. Last dose given 05/11/2013.   Indication:  Manic-Depression     sertraline 100 MG tablet  Commonly known as:  ZOLOFT  Take 1 tablet (100 mg total)  by mouth at bedtime. For anxiety and depression.   Indication:  Major Depressive Disorder           Follow-up Information   Follow up with [facility] . (Contact your act team and they will help you with setting up your hosptial follow-up appointments.  )    Contact information:   [address]  [place], [place] [address] [phone]      Follow-up recommendations:   Activities: Resume activity as tolerated. Diet: Heart healthy low sodium diet Tests: Follow up testing will be determined by your out patient provider. Comments:    Total Discharge Time:  Greater than 30 minutes.  Signed: Synai Prettyman 05/12/2013, 9:10 AM

## 2013-05-12 NOTE — Progress Notes (Signed)
Recreation Therapy Notes   Date: 09.15.2014 Time: 9:30am Location: [address] Dayroom  Group Topic: Coping Skills  Goal Area(s) Addresses:  Patient will verbalize importance of recognizing emotions. Patient will identify at least one emotion. Patient will successfully represent varying emotions in pictures or words.   Behavioral Response:  Appropriate, Attentive, Engaged  Intervention: Art  Activity: Emotion Wheel. As a group patients identified 8 emotions. Using the provided worksheet patients were asked to represent emotions identified by group in pictures of words.    Education: Emotional Recognition, Emotional Regulation, Discharge Planning  Education Outcome: Acknowledges Understanding  Clinical Observations/Feedback: Patient contributed to opening discussion, identifying an emotion for group. Patient participated in activity, using words and pictures to represent emotions on her worksheet.   [name], LRT/CTRS  Jearl Klinefelter 05/12/2013 12:34 PM

## 2013-05-12 NOTE — Progress Notes (Signed)
Discharge note: Pt received both written and verbal discharge instructions. Pt agreed to f/u appt and med regimen. Mr. Alinda Money from group home also received discharge information. Pt was given sample meds and prescriptions. Pt received all belongings from room and locker. Pt safely left [facility] with group home owner Alinda Money.

## 2013-05-12 NOTE — Progress Notes (Signed)
[facility] Adult Case Management Discharge Plan :  Will you be returning to the same living situation after discharge: No. At discharge, do you have transportation home?:Yes,  Group Home Do you have the ability to pay for your medications:Yes,  MCD  Release of information consent forms completed and in the chart;  [name] signature needed at discharge.  Patient to Follow up at: Follow-up Information   Follow up with [facility]  On 05/14/2013. (10:00 with Okey Regal)    Contact information:   [address]  [place], [place] [address] [phone]      Patient denies SI/HI:   Yes,  yes    Safety Planning and Suicide Prevention discussed:  Yes,  yes  Ida Rogue 05/12/2013, 10:45 AM

## 2013-05-14 NOTE — Progress Notes (Signed)
Patient Discharge Instructions:  After Visit Summary (AVS):   Faxed to:  05/14/13 Psychiatric Admission Assessment Note:   Faxed to:  05/14/13 Suicide Risk Assessment - Discharge Assessment:   Faxed to:  05/14/13 Faxed/Sent to the Next Level Care provider:  05/14/13 Faxed to [facility] @ [phone]  Jerelene Redden, 05/14/2013, 3:16 PM

## 2013-05-19 NOTE — Discharge Summary (Signed)
Seen and agreed. Cortland Crehan, MD 

## 2013-07-28 HISTORY — PX: BREAST LUMPECTOMY: SHX2

## 2013-08-08 ENCOUNTER — Ambulatory Visit: Payer: Self-pay | Admitting: Internal Medicine

## 2013-08-13 ENCOUNTER — Ambulatory Visit: Payer: Self-pay | Admitting: Internal Medicine

## 2013-08-25 ENCOUNTER — Ambulatory Visit: Payer: Self-pay | Admitting: Internal Medicine

## 2013-08-26 LAB — PATHOLOGY REPORT

## 2014-07-20 ENCOUNTER — Emergency Department (HOSPITAL_COMMUNITY)
Admission: EM | Admit: 2014-07-20 | Discharge: 2014-07-20 | Disposition: A | Discharge status: Home / Self Care | Payer: Medicare Other | Attending: Emergency Medicine | Admitting: Emergency Medicine

## 2014-07-20 ENCOUNTER — Encounter (HOSPITAL_COMMUNITY): Payer: Self-pay | Admitting: Emergency Medicine

## 2014-07-20 DIAGNOSIS — I10 Essential (primary) hypertension: Secondary | ICD-10-CM | POA: Diagnosis not present

## 2014-07-20 DIAGNOSIS — Z87891 Personal history of nicotine dependence: Secondary | ICD-10-CM | POA: Insufficient documentation

## 2014-07-20 DIAGNOSIS — Z880 Allergy status to penicillin: Secondary | ICD-10-CM | POA: Insufficient documentation

## 2014-07-20 DIAGNOSIS — F329 Major depressive disorder, single episode, unspecified: Secondary | ICD-10-CM | POA: Insufficient documentation

## 2014-07-20 DIAGNOSIS — R197 Diarrhea, unspecified: Secondary | ICD-10-CM | POA: Diagnosis not present

## 2014-07-20 DIAGNOSIS — F209 Schizophrenia, unspecified: Secondary | ICD-10-CM | POA: Insufficient documentation

## 2014-07-20 DIAGNOSIS — F419 Anxiety disorder, unspecified: Secondary | ICD-10-CM | POA: Insufficient documentation

## 2014-07-20 DIAGNOSIS — Z8669 Personal history of other diseases of the nervous system and sense organs: Secondary | ICD-10-CM | POA: Insufficient documentation

## 2014-07-20 DIAGNOSIS — J45909 Unspecified asthma, uncomplicated: Secondary | ICD-10-CM | POA: Diagnosis not present

## 2014-07-20 DIAGNOSIS — F32A Depression, unspecified: Secondary | ICD-10-CM

## 2014-07-20 DIAGNOSIS — Z79899 Other long term (current) drug therapy: Secondary | ICD-10-CM | POA: Diagnosis not present

## 2014-07-20 LAB — RAPID URINE DRUG SCREEN, HOSP PERFORMED
AMPHETAMINES: NOT DETECTED
BARBITURATES: NOT DETECTED
BENZODIAZEPINES: NOT DETECTED
Cocaine: NOT DETECTED
Opiates: NOT DETECTED
Tetrahydrocannabinol: NOT DETECTED

## 2014-07-20 LAB — COMPREHENSIVE METABOLIC PANEL
ALT: 18 U/L (ref 0–35)
AST: 18 U/L (ref 0–37)
Albumin: 3.5 g/dL (ref 3.5–5.2)
Alkaline Phosphatase: 59 U/L (ref 39–117)
Anion gap: 12 (ref 5–15)
BUN: 8 mg/dL (ref 6–23)
CHLORIDE: 102 meq/L (ref 96–112)
CO2: 26 meq/L (ref 19–32)
CREATININE: 0.87 mg/dL (ref 0.50–1.10)
Calcium: 9.6 mg/dL (ref 8.4–10.5)
GFR calc Af Amer: >90 mL/min (ref >90)
GFR, EST NON AFRICAN AMERICAN: 82 mL/min — AB (ref >90)
GLUCOSE: 105 mg/dL — AB (ref 70–99)
Potassium: 4.1 mEq/L (ref 3.7–5.3)
Sodium: 140 mEq/L (ref 137–147)
Total Protein: 7.3 g/dL (ref 6.0–8.3)

## 2014-07-20 LAB — CBC
HEMATOCRIT: 35.2 % — AB (ref 36.0–46.0)
HEMOGLOBIN: 11.7 g/dL — AB (ref 12.0–15.0)
MCH: 30 pg (ref 26.0–34.0)
MCHC: 33.2 g/dL (ref 30.0–36.0)
MCV: 90.3 fL (ref 78.0–100.0)
PLATELETS: 196 10*3/uL (ref 150–400)
RBC: 3.9 MIL/uL (ref 3.87–5.11)
RDW: 13.5 % (ref 11.5–15.5)
WBC: 5.4 10*3/uL (ref 4.0–10.5)

## 2014-07-20 LAB — SALICYLATE LEVEL: Salicylate Lvl: <2 mg/dL — ABNORMAL LOW (ref 2.8–20.0)

## 2014-07-20 LAB — ACETAMINOPHEN LEVEL: Acetaminophen (Tylenol), Serum: <15 ug/mL (ref 10–30)

## 2014-07-20 LAB — ETHANOL

## 2014-07-20 MED ORDER — ACETAMINOPHEN 325 MG PO TABS: 650.0000 mg | ORAL_TABLET | ORAL | Status: DC | PRN

## 2014-07-20 MED ORDER — LORAZEPAM 1 MG PO TABS: 1.0000 mg | ORAL_TABLET | Freq: Three times a day (TID) | ORAL | Status: DC | PRN

## 2014-07-20 NOTE — BH Assessment (Signed)
Attempted to speak with Charlann Lange PA-C but she was currently unavailable. Reviewed notes and will assess pt to prevent unnecessary delay in pt care.   Assessment to commence shortly.   Lear Ng, [facility] Triage Specialist 07/20/2014 10:14 PM

## 2014-07-20 NOTE — ED Provider Notes (Signed)
CSN: [id number]     Arrival date & time 07/20/14  1824 History   First MD Initiated Contact with Patient 07/20/14 2021     No chief complaint on file.  The history is provided by the patient. No language interpreter was used.  This chart was scribed for non-physician practitioner, Charlann Lange, PA-C, working with Artis Delay, MD, by Thea Alken, ED Scribe. This patient was seen in room WTR3/WLPT3 and the patient's care was started at 8:23 PM.  [name] is a 43 y.o. female who presents to the Emergency Department complaining of depression. Pt states she has been arguing with her mother, worsening her depression which has brought her here today. Pt states she is having a hard time with antagonistic behavior and tries to stay to her self. Pt is currently taking risperdal and zoloft which she believes helps. Pt hasn't heard voices but states she began hallucinating about 3 weeks ago.  Pt reports hx of depression and has been seen by Dr. Lavera Guise. She denies changes in medication or new doses. She reports hx of suicidal ideation but denies thoughts at this time. Pt reports an increased appetite and diarrhea.  Pt denies nausea and emesis.   Past Medical History  Diagnosis Date  . Anemia   . Asthma   . Hypertension   . Schizophrenic disorder   . Seizures   . Anxiety   . Depression   . Insomnia, persistent   . Bipolar 1 disorder    Past Surgical History  Procedure Laterality Date  . Tonsillectomy     History reviewed. No pertinent family history. History  Substance Use Topics  . Smoking status: Former Research scientist (life sciences)  . Smokeless tobacco: Never Used  . Alcohol Use: No   OB History    No data available     Review of Systems  Constitutional: Negative for fever.  Eyes: Negative for visual disturbance.  Respiratory: Negative for shortness of breath.   Cardiovascular: Negative for chest pain.  Gastrointestinal: Positive for diarrhea. Negative for nausea and vomiting.  Musculoskeletal:  Negative for myalgias.  Skin: Negative for rash.  Neurological: Negative for weakness and headaches.  Psychiatric/Behavioral: Positive for hallucinations and dysphoric mood. Negative for suicidal ideas and self-injury.   Allergies  Haldol; Penicillins; and Shrimp  Home Medications   Prior to Admission medications   Medication Sig Start Date End Date Taking? Authorizing Provider  benztropine (COGENTIN) 1 MG tablet Take 1 tablet (1 mg total) by mouth at bedtime. For prevention of EPS. 05/12/13   Nena Polio, PA-C  divalproex (DEPAKOTE) 500 MG DR tablet Take 2 tablets (1,000 mg total) by mouth at bedtime. For mood stabilization. 05/12/13   Nena Polio, PA-C  risperiDONE (RISPERDAL) 2 MG tablet Take 1 tablet (2 mg total) by mouth at bedtime. For psychosis. 05/12/13   Nena Polio, PA-C  risperiDONE microspheres (RISPERDAL CONSTA) 50 MG injection Inject 2 mLs (50 mg total) into the muscle every 14 (fourteen) days. For psychosis and mood stabilization. Last dose given 05/11/2013. 05/12/13   Nena Polio, PA-C  sertraline (ZOLOFT) 100 MG tablet Take 1 tablet (100 mg total) by mouth at bedtime. For anxiety and depression. 05/12/13   Nena Polio, PA-C   BP 140/87 mmHg  Pulse 86  Temp(Src) 97.7 F (36.5 C) (Oral)  Resp 16  SpO2 99% Physical Exam  Constitutional: She is oriented to person, place, and time. She appears well-developed and well-nourished. No distress.  HENT:  Head: Normocephalic and atraumatic.  Eyes: Conjunctivae and EOM  are normal.  Neck: Neck supple.  Cardiovascular: Normal rate.   Pulmonary/Chest: Effort normal.  Musculoskeletal: Normal range of motion.  Neurological: She is alert and oriented to person, place, and time.  Skin: Skin is warm and dry.  Psychiatric: Her speech is normal and behavior is normal. Her mood appears anxious. She is not actively hallucinating. She exhibits a depressed mood. She expresses no suicidal ideation. She expresses no suicidal plans.   Patient appears minimally anxious with slightly depressed affect.  Nursing note and vitals reviewed.  ED Course  Procedures  DIAGNOSTIC STUDIES: Oxygen Saturation is 99% on RA, normal by my interpretation.    COORDINATION OF CARE: 8:23 PM- Pt advised of plan for treatment and pt agrees.  Labs Review Labs Reviewed  CBC - Abnormal; Notable for the following:    Hemoglobin 11.7 (*)    HCT 35.2 (*)    All other components within normal limits  ACETAMINOPHEN LEVEL  COMPREHENSIVE METABOLIC PANEL  ETHANOL  SALICYLATE LEVEL  URINE RAPID DRUG SCREEN (HOSP PERFORMED)   Imaging Review No results found.   EKG Interpretation None      MDM   Final diagnoses:  None    I personally performed the services described in this documentation, which was scribed in my presence. The recorded information has been reviewed and is accurate.   1. Depression  The patient is evaluated by TTS services and found appropriate for discharge. Additional community resources provided. The patient is comfortable with care plan. She continues to deny SI/HI.    Dewaine Oats, PA-C 07/24/14 2008  Artis Delay, MD 07/27/14 [phone]

## 2014-07-20 NOTE — BH Assessment (Signed)
Relayed results of assessment to Patriciaann Clan, [place]. Per Patriciaann Clan, PA pt does not meet inpt criteria and can be discharged with OP resources.   Informed Ferne Reus PA-C of recommendations and she is in agreement.   Educated pt about OP resources and instructed her to call her medication management provider in AM to discuss current sx.   Informed RN of plan and pt's concerns regarding transportation tonight.  Lear Ng, [facility] Triage Specialist 07/20/2014 11:04 PM
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
## 2014-07-20 NOTE — ED Notes (Signed)
TTS at bedside talking w/ pt
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
## 2014-07-20 NOTE — Discharge Instructions (Signed)
FOLLOW UP WITH RESOURCES PROVIDED FOR COUNSELING AND WITH DR. MASOUD TO DISCUSS ANY POTENTIAL MEDICATION ADJUSTMENTS.    Depression Depression refers to feeling sad, low, down in the dumps, blue, gloomy, or empty. In general, there are two kinds of depression: 1. Normal sadness or normal grief. This kind of depression is one that we all feel from time to time after upsetting life experiences, such as the loss of a job or the ending of a relationship. This kind of depression is considered normal, is short lived, and resolves within a few days to 2 weeks. Depression experienced after the loss of a loved one (bereavement) often lasts longer than 2 weeks but normally gets better with time. 2. Clinical depression. This kind of depression lasts longer than normal sadness or normal grief or interferes with your ability to function at home, at work, and in school. It also interferes with your personal relationships. It affects almost every aspect of your life. Clinical depression is an illness. Symptoms of depression can also be caused by conditions other than those mentioned above, such as:  Physical illness. Some physical illnesses, including underactive thyroid gland (hypothyroidism), severe anemia, specific types of cancer, diabetes, uncontrolled seizures, heart and lung problems, strokes, and chronic pain are commonly associated with symptoms of depression.  Side effects of some prescription medicine. In some people, certain types of medicine can cause symptoms of depression.  Substance abuse. Abuse of alcohol and illicit drugs can cause symptoms of depression. SYMPTOMS Symptoms of normal sadness and normal grief include the following:  Feeling sad or crying for short periods of time.  Not caring about anything (apathy).  Difficulty sleeping or sleeping too much.  No longer able to enjoy the things you used to enjoy.  Desire to be by oneself all the time (social isolation).  Lack of energy or  motivation.  Difficulty concentrating or remembering.  Change in appetite or weight.  Restlessness or agitation. Symptoms of clinical depression include the same symptoms of normal sadness or normal grief and also the following symptoms:  Feeling sad or crying all the time.  Feelings of guilt or worthlessness.  Feelings of hopelessness or helplessness.  Thoughts of suicide or the desire to harm yourself (suicidal ideation).  Loss of touch with reality (psychotic symptoms). Seeing or hearing things that are not real (hallucinations) or having false beliefs about your life or the people around you (delusions and paranoia). DIAGNOSIS  The diagnosis of clinical depression is usually based on how bad the symptoms are and how long they have lasted. Your health care provider will also ask you questions about your medical history and substance use to find out if physical illness, use of prescription medicine, or substance abuse is causing your depression. Your health care provider may also order blood tests. TREATMENT  Often, normal sadness and normal grief do not require treatment. However, sometimes antidepressant medicine is given for bereavement to ease the depressive symptoms until they resolve. The treatment for clinical depression depends on how bad the symptoms are but often includes antidepressant medicine, counseling with a mental health professional, or both. Your health care provider will help to determine what treatment is best for you. Depression caused by physical illness usually goes away with appropriate medical treatment of the illness. If prescription medicine is causing depression, talk with your health care provider about stopping the medicine, decreasing the dose, or changing to another medicine. Depression caused by the abuse of alcohol or illicit drugs goes away when you  stop using these substances. Some adults need professional help in order to stop drinking or using  drugs. SEEK IMMEDIATE MEDICAL CARE IF:  You have thoughts about hurting yourself or others.  You lose touch with reality (have psychotic symptoms).  You are taking medicine for depression and have a serious side effect. FOR MORE INFORMATION  National Alliance on Mental Illness: www.nami.[employer] of Mental Health: https://carter.com/ Document Released: 08/11/2000 Document Revised: 12/29/2013 Document Reviewed: 11/13/2011 [facility] Patient Information 2015 [place], [place]. This information is not intended to replace advice given to you by your health care provider. Make sure you discuss any questions you have with your health care provider.

## 2014-10-15 ENCOUNTER — Other Ambulatory Visit: Payer: Self-pay | Admitting: Family Medicine

## 2014-10-15 DIAGNOSIS — Z1231 Encounter for screening mammogram for malignant neoplasm of breast: Secondary | ICD-10-CM

## 2014-10-29 ENCOUNTER — Ambulatory Visit
Admission: RE | Admit: 2014-10-29 | Discharge: 2014-10-29 | Disposition: A | Discharge status: Home / Self Care | Payer: Medicare Other | Source: Ambulatory Visit | Attending: Family Medicine | Admitting: Family Medicine

## 2014-10-29 DIAGNOSIS — Z1231 Encounter for screening mammogram for malignant neoplasm of breast: Secondary | ICD-10-CM

## 2014-11-02 ENCOUNTER — Other Ambulatory Visit: Payer: Self-pay | Admitting: Family Medicine

## 2014-11-02 DIAGNOSIS — N644 Mastodynia: Secondary | ICD-10-CM

## 2014-12-14 ENCOUNTER — Encounter (HOSPITAL_COMMUNITY): Payer: Self-pay | Admitting: Emergency Medicine

## 2014-12-14 ENCOUNTER — Observation Stay (HOSPITAL_COMMUNITY)
Admission: AD | Admit: 2014-12-14 | Discharge: 2014-12-15 | Disposition: A | Discharge status: Home / Self Care | Payer: Medicare Other | Attending: Family | Admitting: Family

## 2014-12-14 ENCOUNTER — Encounter (HOSPITAL_COMMUNITY): Payer: Self-pay | Admitting: *Deleted

## 2014-12-14 ENCOUNTER — Emergency Department (HOSPITAL_COMMUNITY)
Admission: EM | Admit: 2014-12-14 | Discharge: 2014-12-14 | Disposition: A | Discharge status: Home / Self Care | Payer: Medicare Other | Attending: Emergency Medicine | Admitting: Emergency Medicine

## 2014-12-14 DIAGNOSIS — F209 Schizophrenia, unspecified: Secondary | ICD-10-CM | POA: Diagnosis not present

## 2014-12-14 DIAGNOSIS — G40909 Epilepsy, unspecified, not intractable, without status epilepticus: Secondary | ICD-10-CM | POA: Insufficient documentation

## 2014-12-14 DIAGNOSIS — F319 Bipolar disorder, unspecified: Secondary | ICD-10-CM | POA: Insufficient documentation

## 2014-12-14 DIAGNOSIS — Z87891 Personal history of nicotine dependence: Secondary | ICD-10-CM | POA: Insufficient documentation

## 2014-12-14 DIAGNOSIS — Z880 Allergy status to penicillin: Secondary | ICD-10-CM | POA: Insufficient documentation

## 2014-12-14 DIAGNOSIS — R51 Headache: Secondary | ICD-10-CM | POA: Diagnosis not present

## 2014-12-14 DIAGNOSIS — I10 Essential (primary) hypertension: Secondary | ICD-10-CM | POA: Insufficient documentation

## 2014-12-14 DIAGNOSIS — G4700 Insomnia, unspecified: Secondary | ICD-10-CM | POA: Diagnosis not present

## 2014-12-14 DIAGNOSIS — Z862 Personal history of diseases of the blood and blood-forming organs and certain disorders involving the immune mechanism: Secondary | ICD-10-CM | POA: Insufficient documentation

## 2014-12-14 DIAGNOSIS — M791 Myalgia, unspecified site: Secondary | ICD-10-CM

## 2014-12-14 DIAGNOSIS — F329 Major depressive disorder, single episode, unspecified: Secondary | ICD-10-CM | POA: Diagnosis present

## 2014-12-14 DIAGNOSIS — Z79899 Other long term (current) drug therapy: Secondary | ICD-10-CM | POA: Diagnosis not present

## 2014-12-14 DIAGNOSIS — Z7982 Long term (current) use of aspirin: Secondary | ICD-10-CM | POA: Insufficient documentation

## 2014-12-14 DIAGNOSIS — Z7951 Long term (current) use of inhaled steroids: Secondary | ICD-10-CM | POA: Diagnosis not present

## 2014-12-14 DIAGNOSIS — F419 Anxiety disorder, unspecified: Secondary | ICD-10-CM | POA: Diagnosis present

## 2014-12-14 DIAGNOSIS — J45909 Unspecified asthma, uncomplicated: Secondary | ICD-10-CM | POA: Insufficient documentation

## 2014-12-14 DIAGNOSIS — Z008 Encounter for other general examination: Secondary | ICD-10-CM | POA: Diagnosis present

## 2014-12-14 DIAGNOSIS — F251 Schizoaffective disorder, depressive type: Principal | ICD-10-CM | POA: Diagnosis present

## 2014-12-14 LAB — URINALYSIS, ROUTINE W REFLEX MICROSCOPIC
Bilirubin Urine: NEGATIVE
Glucose, UA: NEGATIVE mg/dL
Hgb urine dipstick: NEGATIVE
KETONES UR: NEGATIVE mg/dL
LEUKOCYTES UA: NEGATIVE
Nitrite: NEGATIVE
PH: 5.5 (ref 5.0–8.0)
Protein, ur: NEGATIVE mg/dL
Specific Gravity, Urine: 1.029 (ref 1.005–1.030)
Urobilinogen, UA: 1 mg/dL (ref 0.0–1.0)

## 2014-12-14 LAB — CBC WITH DIFFERENTIAL/PLATELET
Basophils Absolute: 0 10*3/uL (ref 0.0–0.1)
Basophils Relative: 0 % (ref 0–1)
Eosinophils Absolute: 0.2 10*3/uL (ref 0.0–0.7)
Eosinophils Relative: 5 % (ref 0–5)
HCT: 34.7 % — ABNORMAL LOW (ref 36.0–46.0)
HEMOGLOBIN: 11.3 g/dL — AB (ref 12.0–15.0)
LYMPHS ABS: 2.2 10*3/uL (ref 0.7–4.0)
Lymphocytes Relative: 43 % (ref 12–46)
MCH: 29 pg (ref 26.0–34.0)
MCHC: 32.6 g/dL (ref 30.0–36.0)
MCV: 89 fL (ref 78.0–100.0)
MONOS PCT: 7 % (ref 3–12)
Monocytes Absolute: 0.4 10*3/uL (ref 0.1–1.0)
NEUTROS ABS: 2.3 10*3/uL (ref 1.7–7.7)
NEUTROS PCT: 45 % (ref 43–77)
Platelets: 210 10*3/uL (ref 150–400)
RBC: 3.9 MIL/uL (ref 3.87–5.11)
RDW: 13.2 % (ref 11.5–15.5)
WBC: 5.2 10*3/uL (ref 4.0–10.5)

## 2014-12-14 LAB — I-STAT CHEM 8, ED
BUN: 10 mg/dL (ref 6–23)
Calcium, Ion: 1.21 mmol/L (ref 1.12–1.23)
Chloride: 104 mmol/L (ref 96–112)
Creatinine, Ser: 0.9 mg/dL (ref 0.50–1.10)
GLUCOSE: 90 mg/dL (ref 70–99)
HEMATOCRIT: 36 % (ref 36.0–46.0)
HEMOGLOBIN: 12.2 g/dL (ref 12.0–15.0)
POTASSIUM: 3.6 mmol/L (ref 3.5–5.1)
Sodium: 143 mmol/L (ref 135–145)
TCO2: 22 mmol/L (ref 0–100)

## 2014-12-14 MED ORDER — RISPERIDONE 2 MG PO TABS
2.0000 mg | ORAL_TABLET | Freq: Every day | ORAL | Status: DC
  Administered 2014-12-14: 2 mg via ORAL
  Filled 2014-12-14 (×2): qty 1

## 2014-12-14 MED ORDER — SERTRALINE HCL 100 MG PO TABS
100.0000 mg | ORAL_TABLET | Freq: Every day | ORAL | Status: DC
  Administered 2014-12-14: 100 mg via ORAL
  Filled 2014-12-14 (×2): qty 1

## 2014-12-14 MED ORDER — ACETAMINOPHEN 325 MG PO TABS
650.0000 mg | ORAL_TABLET | Freq: Four times a day (QID) | ORAL | Status: DC | PRN
  Administered 2014-12-14: 650 mg via ORAL
  Filled 2014-12-14: qty 2

## 2014-12-14 MED ORDER — DIVALPROEX SODIUM 500 MG PO DR TAB
1000.0000 mg | DELAYED_RELEASE_TABLET | Freq: Every day | ORAL | Status: DC
  Administered 2014-12-14: 1000 mg via ORAL
  Filled 2014-12-14 (×2): qty 2

## 2014-12-14 MED ORDER — ALUM & MAG HYDROXIDE-SIMETH 200-200-20 MG/5ML PO SUSP: 30.0000 mL | ORAL | Status: DC | PRN

## 2014-12-14 MED ORDER — BENZTROPINE MESYLATE 1 MG PO TABS
1.0000 mg | ORAL_TABLET | Freq: Every day | ORAL | Status: DC
  Administered 2014-12-14: 1 mg via ORAL
  Filled 2014-12-14 (×2): qty 1

## 2014-12-14 MED ORDER — MAGNESIUM HYDROXIDE 400 MG/5ML PO SUSP: 30.0000 mL | Freq: Every day | ORAL | Status: DC | PRN

## 2014-12-14 MED ORDER — BUDESONIDE-FORMOTEROL FUMARATE 160-4.5 MCG/ACT IN AERO
2.0000 | INHALATION_SPRAY | Freq: Two times a day (BID) | RESPIRATORY_TRACT | Status: DC
  Administered 2014-12-14: 2 via RESPIRATORY_TRACT
  Filled 2014-12-14 (×5): qty 6

## 2014-12-14 NOTE — Progress Notes (Signed)
[facility] INPATIENT:  Family/Significant Other Suicide Prevention Education  Suicide Prevention Education:  Patient Refusal for Family/Significant Other Suicide Prevention Education: The patient [name] has refused to provide written consent for family/significant other to be provided Family/Significant Other Suicide Prevention Education during admission and/or prior to discharge.  Physician notified.  Debbrah Alar 12/14/2014, 7:50 AM

## 2014-12-14 NOTE — [facility] Counselor (Signed)
TTS Counselor attempted to call pt's mother to follow up about the pt being able to return home to live with her after discharge. Counselor called home number listed in chart [phone]) but received no answer and no ability to leave voicemail. Counselor will attempt again this evening.   Ramond Dial, [facility] Triage Specialist

## 2014-12-14 NOTE — Plan of Care (Signed)
[facility] Crisis Plan  Reason for Crisis Plan:  Crisis Stabilization   Plan of Care:  Referral for Telepsychiatry/Psychiatric Consult  Family Support:      Current Living Environment:  Living Arrangements: Parent, Children  Insurance:   Hospital Account    Name Acct ID Class Status Primary Coverage   [name], [name] [id number] [place] - MEDICARE PART A AND B        Guarantor Account (for Hospital Account [id number])    Name Relation to [place]? Acct Type   [name] [facility] Yes Behavioral Health   Address Phone       [address] [place], [name] [phone])          Coverage Information (for Hospital Account [id number])    1. [place] PART A AND B    F/O Payor/Plan Precert #   MEDICARE/MEDICARE PART A AND B    Subscriber Subscriber #   [name] [id number]   Address Phone   [address] [name] [address]        2. [facility] MEDICAID/[place] MED[place]    F/O Payor/Plan Precert #   [facility] MEDICAID/[place]    Subscriber Subscriber #   [name] [id number]   Address Phone   [address] [place], [place] [address] [phone]          Legal Guardian:     Primary Care Provider:  Cletis Athens, [name]  Current Outpatient Providers:  other  Psychiatrist:  Name of Psychiatrist: Rushie Chestnut, [name]  Counselor/Therapist:  Name of Therapist: [name] with Medications:  Yes  Additional Information:   [name] 4/18/20167:45 AM

## 2014-12-14 NOTE — Discharge Instructions (Signed)
Your labs are normal your urine is normal Please make an appointment with your PCP or Psychiatrist for follow up

## 2014-12-14 NOTE — ED Notes (Addendum)
Patient states she developed a frontal and bilateral temporal headache about 5 days ago. Denies nausea or vomiting. Dizziness and light sensitivity. Also, complains of tingling all over in the AM.

## 2014-12-14 NOTE — [facility] Counselor (Signed)
Pt woke up and ambulated to bathroom. Gave urine sample to [employer]. Writer got lunch tray for patient. Patient is now eating lunch. Affect is euthymic. Patient is oriented x 4.   Arnold Long, [place] [facility] Triage Specialist

## 2014-12-14 NOTE — ED Provider Notes (Signed)
CSN: [id number]     Arrival date & time 12/14/14  0229 History   First MD Initiated Contact with Patient 12/14/14 0232     Chief Complaint  Patient presents with  . Psychiatric Evaluation    Patient has had a metallic like feeling from her head down to the fingertips for the last 4 to 5 days. Patient has had an increase in one of her psychotic medications one week ago.     (Consider location/radiation/quality/duration/timing/severity/associated sxs/prior Treatment) HPI Comments: Patient presents stating that she has a metallic healing over her whole body.  It makes her T and goal.  She has a headache for the past 5 days.  It resolves when she takes Tylenol.  She reports that she's had some nausea, one or 2 episodes of vomiting.  There is very vague about exactly when or how often to normal bowel movement last night.  She's been eating and drinking well.  Denies any cough, fever, dysuria.  She states the symptoms usually recur when she is stressed and her mother as had requested her to move out of the house but just a place to go.  This is causing her stress  The history is provided by the patient.    Past Medical History  Diagnosis Date  . Anemia   . Asthma   . Hypertension   . Schizophrenic disorder   . Seizures   . Anxiety   . Depression   . Insomnia, persistent   . Bipolar 1 disorder    Past Surgical History  Procedure Laterality Date  . Tonsillectomy     History reviewed. No pertinent family history. History  Substance Use Topics  . Smoking status: Former Research scientist (life sciences)  . Smokeless tobacco: Never Used  . Alcohol Use: No   OB History    No data available     Review of Systems  Respiratory: Negative for cough and shortness of breath.   Cardiovascular: Negative for chest pain and leg swelling.  Gastrointestinal: Negative for vomiting, diarrhea and constipation.  Skin: Negative for rash.  Neurological: Positive for dizziness and headaches.  Psychiatric/Behavioral: Negative  for suicidal ideas and behavioral problems. The patient is nervous/anxious. The patient is not hyperactive.   All other systems reviewed and are negative.     Allergies  Haldol; Penicillins; and Shrimp  Home Medications   Prior to Admission medications   Medication Sig Start Date End Date Taking? Authorizing Provider  aspirin 325 MG tablet Take 650 mg by mouth every 4 (four) hours as needed for moderate pain.   Yes Historical Provider, MD  benztropine (COGENTIN) 1 MG tablet Take 1 tablet (1 mg total) by mouth at bedtime. For prevention of EPS. 05/12/13  Yes Milta Deiters T Mashburn, PA-C  budesonide-formoterol (SYMBICORT) 160-4.5 MCG/ACT inhaler Inhale 2 puffs into the lungs 2 (two) times daily.   Yes Historical Provider, MD  divalproex (DEPAKOTE) 500 MG DR tablet Take 2 tablets (1,000 mg total) by mouth at bedtime. For mood stabilization. 05/12/13  Yes Ruben Im, PA-C  Paliperidone Palmitate (INVEGA TRINZA IM) Inject 1 vial into the muscle every 30 (thirty) days.   Yes Historical Provider, MD  risperiDONE (RISPERDAL) 2 MG tablet Take 1 tablet (2 mg total) by mouth at bedtime. For psychosis. 05/12/13  Yes Milta Deiters T Mashburn, PA-C  sertraline (ZOLOFT) 100 MG tablet Take 1 tablet (100 mg total) by mouth at bedtime. For anxiety and depression. 05/12/13  Yes Milta Deiters T Mashburn, PA-C  risperiDONE microspheres (RISPERDAL CONSTA) 50 MG  injection Inject 2 mLs (50 mg total) into the muscle every 14 (fourteen) days. For psychosis and mood stabilization. Last dose given 05/11/2013. Patient not taking: Reported on 12/14/2014 05/12/13   Ruben Im, PA-C   BP 111/78 mmHg  Pulse 72  Temp(Src) 98.1 F (36.7 C) (Oral)  Resp 18  Ht 5\' 6"  (1.676 m)  Wt 263 lb (119.296 kg)  BMI 42.47 kg/m2  SpO2 98% Physical Exam  Constitutional: She appears well-developed and well-nourished.  HENT:  Head: Normocephalic.  Eyes: Pupils are equal, round, and reactive to light.  Neck: Normal range of motion.  Cardiovascular:  Normal rate.   Pulmonary/Chest: Effort normal.  Abdominal: Soft. Bowel sounds are normal.  Skin: Skin is warm.  Nursing note and vitals reviewed.   ED Course  Procedures (including critical care time) Labs Review Labs Reviewed  CBC WITH DIFFERENTIAL/PLATELET - Abnormal; Notable for the following:    Hemoglobin 11.3 (*)    HCT 34.7 (*)    All other components within normal limits  URINALYSIS, ROUTINE W REFLEX MICROSCOPIC - Abnormal; Notable for the following:    APPearance CLOUDY (*)    All other components within normal limits  I-STAT CHEM 8, ED    Imaging Review No results found.   EKG Interpretation None     Patient has been reassured that she does not have an infection and that she can follow-up with her counselor as needed.  She again denies suicidality or homicidality.  I discussed he is safe to follow-up outpatient MDM   Final diagnoses:  Myalgia         Junius Creamer, NP 12/14/14 2336  Debby Freiberg, MD 12/17/14 [id number]

## 2014-12-14 NOTE — Progress Notes (Signed)
Patient is a 44 yo woman admitted to the observation unit for depression and anxiety. Patient states "I found myself going to a bad place and knew I needed help". Patient stated when she gets bad she stays in her room and isolates from everyone and can't take care of her children.  Patients affect is blunted. She is sad and depressed. Denies HI, SI, AVH.  Able to recall all of her meds and dosing. Stated she gets an [place] shot next week. She states her main stressor is her relationship with her mother. Pt was pleasant and cooperative throughout assessment. Pt feels she is currently unstable, says she doesn't feel safe returning home with her mother.Oriented to unit. Nutrition offered. Education provided regarding safety and falls. Safety checks started every 15 minutes.

## 2014-12-14 NOTE — ED Notes (Signed)
Patient has had a metallic like feeling from her head down to the fingertips for the last 4 to 5 days. Patient has had an increase in one of her psychotic medications one week ago
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
## 2014-12-14 NOTE — ED Notes (Signed)
Bed: WC13 Expected date:  Expected time:  Means of arrival:  Comments: EMS thinks BP is too high

## 2014-12-14 NOTE — Progress Notes (Signed)
Discussed orders with Heloise Purpura. Conrad to put in orders for patient.
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
## 2014-12-14 NOTE — Progress Notes (Signed)
Pt alert and cooperative. "I'm stressed over my housing". Pt reports she lives with her mother who wants her to move out "She wants me to be independent". Denies SI/HI, -A/[place], verbally contracts for safety. Pt affect/mood anxious and depressed. C/o headache, see MAR. Emotional support and encouragement given. Will continue to monitor closely and evaluate for stabilization.

## 2014-12-14 NOTE — [facility] Counselor (Addendum)
Writer woke up pt to speak w/ her as writer's shift ends soon. Pt is pleasant and cooperative. She says she isn't sure whether her mom will let her come back and live w/ mom. Pt sts she has an [place] injection schedule 4/25 at [facility] w/ Dr Rushie Chestnut in [facility]. She reports she may not be able to get back to [facility]. If this is the case, pt sts she will go to [facility] to see if she can get injection. Pt reports she knows where [facility] is and knows that she will have to go there early in am as it is first come, first serve. Pt reports she is going to [facility] at least twice a week for talk therapy and group therapy. Pt says that her two children (son 58 & daughter 59) live permanently w/ pt's mom in [place]. She says children's father also lives in [place].   Arnold Long, [place] Therapeutic Triage Specialist

## 2014-12-15 DIAGNOSIS — F251 Schizoaffective disorder, depressive type: Secondary | ICD-10-CM | POA: Diagnosis not present

## 2014-12-15 LAB — VALPROIC ACID LEVEL: Valproic Acid Lvl: 59.6 ug/mL (ref 50.0–100.0)

## 2014-12-15 LAB — URINALYSIS, ROUTINE W REFLEX MICROSCOPIC
Bilirubin Urine: NEGATIVE
Glucose, UA: NEGATIVE mg/dL
Hgb urine dipstick: NEGATIVE
KETONES UR: NEGATIVE mg/dL
Nitrite: NEGATIVE
PH: 6.5 (ref 5.0–8.0)
Protein, ur: NEGATIVE mg/dL
Specific Gravity, Urine: 1.014 (ref 1.005–1.030)
UROBILINOGEN UA: 0.2 mg/dL (ref 0.0–1.0)

## 2014-12-15 LAB — HEPATIC FUNCTION PANEL
ALK PHOS: 60 U/L (ref 39–117)
ALT: 16 U/L (ref 0–35)
AST: 15 U/L (ref 0–37)
Albumin: 3.8 g/dL (ref 3.5–5.2)
Bilirubin, Direct: <0.1 mg/dL (ref 0.0–0.5)
TOTAL PROTEIN: 7.5 g/dL (ref 6.0–8.3)
Total Bilirubin: 0.3 mg/dL (ref 0.3–1.2)

## 2014-12-15 LAB — URINE MICROSCOPIC-ADD ON

## 2014-12-15 LAB — TSH: TSH: 0.514 u[IU]/mL (ref 0.350–4.500)

## 2014-12-15 LAB — RAPID URINE DRUG SCREEN, HOSP PERFORMED
AMPHETAMINES: NOT DETECTED
Barbiturates: NOT DETECTED
Benzodiazepines: NOT DETECTED
Cocaine: NOT DETECTED
OPIATES: NOT DETECTED
Tetrahydrocannabinol: NOT DETECTED

## 2014-12-15 LAB — T4, FREE: FREE T4: 1.17 ng/dL (ref 0.80–1.80)

## 2014-12-15 LAB — PREGNANCY, URINE: PREG TEST UR: NEGATIVE

## 2014-12-15 MED ORDER — BUDESONIDE-FORMOTEROL FUMARATE 160-4.5 MCG/ACT IN AERO: 2.0000 | INHALATION_SPRAY | Freq: Two times a day (BID) | RESPIRATORY_TRACT | Status: DC

## 2014-12-15 MED ORDER — PALIPERIDONE PALMITATE 273 MG/0.875ML IM SUSP
1.0000 | INTRAMUSCULAR | Status: DC
Start: 2014-12-15 — End: 2015-01-20

## 2014-12-15 NOTE — Progress Notes (Signed)
Patient discharged per MD orders. Patient given education regarding follow-up appointments and medications. Patient denies any questions or concerns about these instructions. Patient was escorted to locker and given belongings before discharge to hospital lobby. Patient currently denies SI/HI and auditory and visual hallucinations on discharge.

## 2014-12-15 NOTE — BH Assessment (Signed)
Pt signed the ROI for [facility] to speak with her uncle, Damaris Hippo, at [phone]. Pt's uncle was contacted and he said that she could stay with him at [place], in [place]. Jeneen Rinks reported that he could pick the pt up at around 12:30 or 1 pm on 12/15/14.   Ruben Reason, MA, [facility], [facility]

## 2014-12-15 NOTE — BHH Suicide Risk Assessment (Addendum)
Suicide Risk Assessment  Discharge Assessment   [facility] Discharge Suicide Risk Assessment   Demographic Factors:  NA  Total Time spent with patient: 30 minutes  Musculoskeletal: Strength & Muscle Tone: within normal limits Gait & Station: normal Patient leans: N/A  Psychiatric Specialty Exam:     Blood pressure 135/92, pulse 84, temperature 98.2 F (36.8 C), temperature source Oral, resp. rate 18, height 5\' 6"  (1.676 m), weight 260 lb (117.935 kg), SpO2 100 %.Body mass index is 41.99 kg/(m^2).  General Appearance: Casual  Eye Contact::  Good  Speech:  Normal Rate409  Volume:  Normal  Mood:  Euthymic  Affect:  Blunt  Thought Process:  Coherent  Orientation:  Full (Time, Place, and Person)  Thought Content:  WDL  Suicidal Thoughts:  No  Homicidal Thoughts:  No  Memory:  Immediate;   Good Recent;   Good Remote;   Good  Judgement:  Good  Insight:  Good  Psychomotor Activity:  Normal  Concentration:  Good  Recall:  Good  Fund of Knowledge:Good  Language: Good  Akathisia:  No  Handed:  Right  AIMS (if indicated):     Assets:  Communication Skills Desire for Improvement Financial Resources/Insurance Leisure Time Physical Health Resilience  Sleep:     Cognition: WNL  ADL's:  Intact   Have you used any form of tobacco in the last 30 days? (Cigarettes, Smokeless Tobacco, Cigars, and/or Pipes): No  Has this patient used any form of tobacco in the last 30 days? (Cigarettes, Smokeless Tobacco, Cigars, and/or Pipes) No  Mental Status Per Nursing Assessment::   On Admission:  NA  Current Mental Status by Physician: NA  Loss Factors: NA  Historical Factors: NA  Risk Reduction Factors:   Sense of responsibility to family, Positive therapeutic relationship and Positive coping skills or problem solving skills  Continued Clinical Symptoms:  None  Cognitive Features That Contribute To Risk:  None    Suicide Risk:  Minimal: No identifiable suicidal ideation.   Patients presenting with no risk factors but with morbid ruminations; may be classified as minimal risk based on the severity of the depressive symptoms  Principal Problem: Schizoaffective disorder, depressive type Discharge Diagnoses:  Patient Active Problem List   Diagnosis Date Noted  . Schizoaffective disorder, depressive type [F25.1] 04/05/2012    Priority: High      Plan Of Care/Follow-up recommendations:  Activity:  as tolerated Diet:  heart healthy diet  Is patient on multiple antipsychotic therapies at discharge:  Yes Has Patient had three or more failed trials of antipsychotic monotherapy by history:  Yes  Recommended Plan for Multiple Antipsychotic Therapies: Patient will follow-up with [facility] next week to re-evaluate her medications.  She has been stable on her current regiment of Risperdal 2 mg po at bedtime and Invega monthly injections    Waylan Boga, PMH-NP 12/15/2014, 8:49 AM

## 2014-12-15 NOTE — BH Assessment (Signed)
Pt given resources for shelters and bus passes to get to her appointment with [facility] on 12/21/14.  Ruben Reason, MA, LPCA, [facility]

## 2014-12-15 NOTE — Discharge Instructions (Addendum)
Schizoaffective Disorder Schizoaffective disorder (ScAD) is a mental illness. It causes symptoms that are a mixture of schizophrenia (a psychotic disorder) and an affective (mood) disorder. The schizophrenic symptoms may include delusions, hallucinations, or odd behavior. The mood symptoms may be similar to major depression or bipolar disorder. ScAD may interfere with personal relationships or normal daily activities. People with ScAD are at increased risk for job loss, social isolation,physical health problems, anxiety and substance use disorders, and suicide. ScAD usually occurs in cycles. Periods of severe symptoms are followed by periods of less severe symptoms or improvement. The illness affects men and women equally but usually appears at an earlier age (teenage or early adult years) in men. People who have family members with schizophrenia, bipolar disorder, or ScAD are at higher risk of developing ScAD. SYMPTOMS  At any one time, people with ScAD may have psychotic symptoms only or both psychotic and mood symptoms. The psychotic symptoms include one or more of the following:  Hearing, seeing, or feeling things that are not there (hallucinations).   Having fixed, false beliefs (delusions). The delusions usually are of being attacked, harassed, cheated, persecuted, or conspired against (paranoid delusions).  Speaking in a way that makes no sense to others (disorganized speech). The psychotic symptoms of ScAD may also include confusing or odd behavior or any of the negative symptoms of schizophrenia. These include loss of motivation for normal daily activities, such as bathing or grooming, withdrawal from other people, and lack of emotions.  The mood symptoms of ScAD occur more often than not. They resemble major depressive disorder or bipolar mania. Symptoms of major depression include depressed mood and four or more of the following:  Loss of interest in usually pleasurable activities  (anhedonia).  Sleeping more or less than normal.  Feeling worthless or excessively guilty.  Lack of energy or motivation.  Trouble concentrating.  Eating more or less than usual.  Thinking a lot about death or suicide. Symptoms of bipolar mania include abnormally elevated or irritable mood and increased energy or activity, plus three or more of the following:   More confidence than normal or feeling that you are able to do anything (grandiosity).  Feeling rested with less sleep than normal.   Being easily distracted.   Talking more than usual or feeling pressured to keep talking.   Feeling that your thoughts are racing.  Engaging in high-risk activities such as buying sprees or foolish business decisions. DIAGNOSIS  ScAD is diagnosed through an assessment by your health care provider. Your health care provider will observe and ask questions about your thoughts, behavior, mood, and ability to function in daily life. Your health care provider may also ask questions about your medical history and use of drugs, including prescription medicines. Your health care provider may also order blood tests and imaging exams. Certain medical conditions and substances can cause symptoms that resemble ScAD. Your health care provider may refer you to a mental health specialist for evaluation.  ScAD is divided into two types. The depressive type is diagnosed if your mood symptoms are limited to major depression. The bipolar type is diagnosed if your mood symptoms are manic or a mixture of manic and depressive symptoms TREATMENT  ScAD is usually a lifelong illness. Long-term treatment is necessary. The following treatments are available:  Medicine. Different types of medicine are used to treat ScAD. The exact combination depends on the type and severity of your symptoms. Antipsychotic medicine is used to control psychotic symptomssuch as delusions, paranoia,  and hallucinations. Mood stabilizers can  even the highs and lows of bipolar manic mood swings. Antidepressant medicines are used to treat major depressive symptoms.  Counseling or talk therapy. Individual, group, or family counseling may be helpful in providing education, support, and guidance. Many people with ScAD also benefit from social skills and job skills (vocational) training. A combination of medicine and counseling is usually best for managing the disorder over time. A procedure in which electricity is applied to the brain through the scalp (electroconvulsive therapy) may be used to treat people with severe manic symptoms that do not respond to medicine and counseling. HOME CARE INSTRUCTIONS   Take all your medicine as prescribed.  Check with your health care provider before starting new prescription or over-the-counter medicines.  Keep all follow up appointments with your health care provider. SEEK MEDICAL CARE IF:   If you are not able to take your medicines as prescribed.  If your symptoms get worse. SEEK IMMEDIATE MEDICAL CARE IF:   You have serious thoughts about hurting yourself or others. Document Released: 12/25/2006 Document Revised: 12/29/2013 Document Reviewed: 03/28/2013 [facility] Patient Information 2015 [place], [place]. This information is not intended to replace advice given to you by your health care provider. Make sure you discuss any questions you have with your health care provider.  Pt will follow up with [facility] and Dr Rushie Chestnut in [place], [place]. Pt will show up for her appointment at around 8:30 am in order to begin the intake process again. Pt will call [facility] or return to the ED if suicidal thoughts return before her appointment.

## 2014-12-15 NOTE — Discharge Summary (Signed)
Physician Discharge Summary Note  Patient:  [name] is an 44 y.o., female MRN:  [id number] DOB:  1971/05/04 Patient phone:  [phone] (home)  Patient address:   [place] [address],  Total Time spent with patient: 30 minutes  Date of Admission:  12/14/2014 Date of Discharge: 12/15/2014  Reason for Admission:  Anxiety, stress  Principal Problem: Schizoaffective disorder, depressive type Discharge Diagnoses: Patient Active Problem List   Diagnosis Date Noted  . Schizoaffective disorder, depressive type [F25.1] 04/05/2012    Priority: High    Musculoskeletal: Strength & Muscle Tone: within normal limits Gait & Station: normal Patient leans: N/A  Psychiatric Specialty Exam: Physical Exam  Review of Systems  Constitutional: Negative.   HENT: Negative.   Eyes: Negative.   Respiratory: Negative.   Cardiovascular: Negative.   Gastrointestinal: Negative.   Genitourinary: Negative.   Musculoskeletal: Negative.   Skin: Negative.   Neurological: Negative.   Endo/Heme/Allergies: Negative.   Musculoskeletal: Strength & Muscle Tone: within normal limits Gait & Station: normal Patient leans: N/A  Psychiatric Specialty Exam:     Blood pressure 135/92, pulse 84, temperature 98.2 F (36.8 C), temperature source Oral, resp. rate 18, height 5\' 6"  (1.676 m), weight 260 lb (117.935 kg), SpO2 100 %.Body mass index is 41.99 kg/(m^2).  General Appearance: Casual  Eye Contact::  Good  Speech:  Normal Rate409  Volume:  Normal  Mood:  Euthymic  Affect:  Blunt  Thought Process:  Coherent  Orientation:  Full (Time, Place, and Person)  Thought Content:  WDL  Suicidal Thoughts:  No  Homicidal Thoughts:  No  Memory:  Immediate;   Good Recent;   Good Remote;   Good  Judgement:  Good  Insight:  Good  Psychomotor Activity:  Normal  Concentration:  Good  Recall:  Good  Fund of Knowledge:Good  Language: Good  Akathisia:  No  Handed:  Right  AIMS (if  indicated):     Assets:  Communication Skills Desire for Improvement Financial Resources/Insurance Leisure Time Physical Health Resilience  Sleep:     Cognition: WNL  ADL's:  Intact    Blood pressure 135/92, pulse 84, temperature 98.2 F (36.8 C), temperature source Oral, resp. rate 18, height 5\' 6"  (1.676 m), weight 260 lb (117.935 kg), SpO2 100 %.Body mass index is 41.99 kg/(m^2).   Past Medical History:  Past Medical History  Diagnosis Date  . Anemia   . Asthma   . Hypertension   . Schizophrenic disorder   . Seizures   . Anxiety   . Depression   . Insomnia, persistent   . Bipolar 1 disorder     Past Surgical History  Procedure Laterality Date  . Tonsillectomy     Family History: History reviewed. No pertinent family history. Social History:  History  Alcohol Use No     History  Drug Use No    History   Social History  . Marital Status: Single    Spouse Name: N/A  . Number of Children: N/A  . Years of Education: N/A   Social History Main Topics  . Smoking status: Former Research scientist (life sciences)  . Smokeless tobacco: Never Used  . Alcohol Use: No  . Drug Use: No  . Sexual Activity: No   Other Topics Concern  . None   Social History Narrative    Past Psychiatric History: Hospitalizations:  Multiple in the past until injectable antipsychotic  Outpatient Care:  [facility]  Substance Abuse Care:  NA  Self-Mutilation:  NA  Suicidal Attempts:  Past history  Violent Behaviors:  None   Risk to Self: Suicidal Ideation: No Suicidal Intent: No Is patient at risk for suicide?: No Suicidal Plan?: No Access to Means: No What has been your use of drugs/alcohol within the last 12 months?: Pt denies How many times?: 0 Other Self Harm Risks: None identified Triggers for Past Attempts: None known Intentional Self Injurious Behavior: None Risk to Others: Homicidal Ideation: No Thoughts of Harm to Others: No Current Homicidal Intent: No Current Homicidal Plan:  No Access to Homicidal Means: No Identified Victim: None History of harm to others?: No Assessment of Violence: None Noted Violent Behavior Description: Pt denies history of violence Does patient have access to weapons?: No Criminal Charges Pending?: No Does patient have a court date: No Prior Inpatient Therapy: Prior Inpatient Therapy: Yes Prior Therapy Dates: Multiple admits Prior Therapy Facilty/Provider(s): [facility] Reason for Treatment: Schizoaffective disorder Prior Outpatient Therapy: Prior Outpatient Therapy: Yes Prior Therapy Dates: current Prior Therapy Facilty/Provider(s): [facility] Chestnut, [place] Reason for Treatment: Schizoaffective disorder  Level of Care:  OP  Hospital Course:  The patient went to the ED for physical issues and discharged.  She then came to [facility] seeking help with her anxiety and stress.  [name] has been staying with her mother while she saves money for her own apartment.  However, they do not always get along and she will need to find another place to stay.  She also receives her psychiatric care in [place], [place] which is difficult for her to manage.  [name] was assisted with resources and an appointment established at [facility] next week for her psychiatric care and injectable anti-psychotic medications.  She is much more talkative and interactive today then yesterday with anxiety dissipating.  [name] has made several connections for apartments and plans to seek resources for additional assistance once she is discharged.  She denies suicidal/homicial ideations, hallucinations, and alcohol/drug issues.  [name] is more future oriented and stable for discharge.  She has a supply of her medications and will given bus passes to get to the shelter and her appointment next week.  Consults:  None  Significant Diagnostic Studies:  None  Discharge Vitals:   Blood pressure 135/92, pulse 84, temperature 98.2 F (36.8 C), temperature source Oral, resp.  rate 18, height 5\' 6"  (1.676 m), weight 260 lb (117.935 kg), SpO2 100 %. Body mass index is 41.99 kg/(m^2). Lab Results:   Results for orders placed or performed during the hospital encounter of 12/14/14 (from the past 72 hour(s))  Pregnancy, urine     Status: None   Collection Time: 12/14/14 11:49 AM  Result Value Ref Range   Preg Test, Ur NEGATIVE NEGATIVE    Comment:        THE SENSITIVITY OF THIS METHODOLOGY IS >20 mIU/mL. Performed at [facility]   Urine rapid drug screen (hosp performed)     Status: None   Collection Time: 12/14/14 11:49 AM  Result Value Ref Range   Opiates NONE DETECTED NONE DETECTED   Cocaine NONE DETECTED NONE DETECTED   Benzodiazepines NONE DETECTED NONE DETECTED   Amphetamines NONE DETECTED NONE DETECTED   Tetrahydrocannabinol NONE DETECTED NONE DETECTED   Barbiturates NONE DETECTED NONE DETECTED    Comment:        DRUG SCREEN FOR MEDICAL PURPOSES ONLY.  IF CONFIRMATION IS NEEDED FOR ANY PURPOSE, NOTIFY LAB WITHIN 5 DAYS.        LOWEST DETECTABLE LIMITS  FOR URINE DRUG SCREEN Drug Class       Cutoff (ng/mL) Amphetamine      1000 Barbiturate      200 Benzodiazepine   341 Tricyclics       937 Opiates          300 Cocaine          300 THC              50 Performed at [name]   Urinalysis, Routine w reflex microscopic     Status: Abnormal   Collection Time: 12/14/14 11:49 AM  Result Value Ref Range   Color, Urine YELLOW YELLOW   APPearance CLEAR CLEAR   Specific Gravity, Urine 1.014 1.005 - 1.030   pH 6.5 5.0 - 8.0   Glucose, UA NEGATIVE NEGATIVE mg/dL   Hgb urine dipstick NEGATIVE NEGATIVE   Bilirubin Urine NEGATIVE NEGATIVE   Ketones, ur NEGATIVE NEGATIVE mg/dL   Protein, ur NEGATIVE NEGATIVE mg/dL   Urobilinogen, UA 0.2 0.0 - 1.0 mg/dL   Nitrite NEGATIVE NEGATIVE   Leukocytes, UA TRACE (A) NEGATIVE    Comment: Performed at [facility]  Urine microscopic-add on     Status:  Abnormal   Collection Time: 12/14/14 11:49 AM  Result Value Ref Range   Squamous Epithelial / LPF FEW (A) RARE   WBC, UA 0-2 <3 WBC/hpf   Bacteria, UA RARE RARE    Comment: Performed at [facility]    Physical Findings: AIMS: Facial and Oral Movements Muscles of Facial Expression: None, normal Lips and Perioral Area: None, normal Jaw: None, normal Tongue: None, normal,Extremity Movements Upper (arms, wrists, hands, fingers): None, normal Lower (legs, knees, ankles, toes): None, normal, Trunk Movements Neck, shoulders, hips: None, normal, Overall Severity Severity of abnormal movements (highest score from questions above): None, normal Incapacitation due to abnormal movements: None, normal [name] awareness of abnormal movements (rate only patient's report): No Awareness, Dental Status Current problems with teeth and/or dentures?: No Does patient usually wear dentures?: No  CIWA:    COWS:      See Psychiatric Specialty Exam and Suicide Risk Assessment completed by Attending Physician prior to discharge.  Discharge destination:  Home  Is patient on multiple antipsychotic therapies at discharge:  Yes  Has Patient had three or more failed trials of antipsychotic monotherapy by history:  Yes    Recommended Plan for Multiple Antipsychotic Therapies: Follow-up with [facility] next week to re-evaluate her medications but patient has been stable with her current regiment of Risperdal 2 mg po at bedtime and Invega monthly injections     Medication List    ASK your doctor about these medications      Indication   aspirin 325 MG tablet  Take 650 mg by mouth every 4 (four) hours as needed for moderate pain.      benztropine 1 MG tablet  Commonly known as:  COGENTIN  Take 1 tablet (1 mg total) by mouth at bedtime. For prevention of EPS.   Indication:  Extrapyramidal Reaction caused by Medications     budesonide-formoterol 160-4.5 MCG/ACT inhaler  Commonly  known as:  SYMBICORT  Inhale 2 puffs into the lungs 2 (two) times daily.      divalproex 500 MG DR tablet  Commonly known as:  DEPAKOTE  Take 2 tablets (1,000 mg total) by mouth at bedtime. For mood stabilization.   Indication:  Depressive Phase of Manic-Depression     INVEGA TRINZA IM  Inject 1 vial into the muscle every 30 (thirty) days.      risperiDONE 2 MG tablet  Commonly known as:  RISPERDAL  Take 1 tablet (2 mg total) by mouth at bedtime. For psychosis.   Indication:  Manic-Depression     risperiDONE microspheres 50 MG injection  Commonly known as:  RISPERDAL CONSTA  Inject 2 mLs (50 mg total) into the muscle every 14 (fourteen) days. For psychosis and mood stabilization. Last dose given 05/11/2013.   Indication:  Manic-Depression     sertraline 100 MG tablet  Commonly known as:  ZOLOFT  Take 1 tablet (100 mg total) by mouth at bedtime. For anxiety and depression.   Indication:  Major Depressive Disorder         Follow-up recommendations:  Activity:  as tolerated Diet:  hearth healthy diet  Comments:  The patient has stabilized and will discharge to [facility].  She is scheduled to go to [facility] next week for her monthly anti-psychotic injection  Total Discharge Time:  30 minutes  Signed: Waylan Boga, PMH-NP 12/15/2014, 8:53 AM

## 2015-01-10 ENCOUNTER — Emergency Department (HOSPITAL_COMMUNITY)
Admission: EM | Admit: 2015-01-10 | Discharge: 2015-01-10 | Disposition: A | Discharge status: Home / Self Care | Payer: Medicare Other | Attending: Emergency Medicine | Admitting: Emergency Medicine

## 2015-01-10 ENCOUNTER — Inpatient Hospital Stay (HOSPITAL_COMMUNITY)
Admission: AD | Admit: 2015-01-10 | Discharge: 2015-01-20 | DRG: 885 | Disposition: A | Discharge status: Nursing Home (Includes ICF) | Payer: Medicare Other | Attending: Psychiatry | Admitting: Psychiatry

## 2015-01-10 ENCOUNTER — Encounter (HOSPITAL_COMMUNITY): Payer: Self-pay | Admitting: Emergency Medicine

## 2015-01-10 ENCOUNTER — Encounter (HOSPITAL_COMMUNITY): Payer: Self-pay | Admitting: *Deleted

## 2015-01-10 DIAGNOSIS — Z008 Encounter for other general examination: Secondary | ICD-10-CM | POA: Diagnosis present

## 2015-01-10 DIAGNOSIS — F419 Anxiety disorder, unspecified: Secondary | ICD-10-CM | POA: Insufficient documentation

## 2015-01-10 DIAGNOSIS — Z91013 Allergy to seafood: Secondary | ICD-10-CM

## 2015-01-10 DIAGNOSIS — Z7951 Long term (current) use of inhaled steroids: Secondary | ICD-10-CM | POA: Diagnosis not present

## 2015-01-10 DIAGNOSIS — Z862 Personal history of diseases of the blood and blood-forming organs and certain disorders involving the immune mechanism: Secondary | ICD-10-CM | POA: Diagnosis not present

## 2015-01-10 DIAGNOSIS — F319 Bipolar disorder, unspecified: Secondary | ICD-10-CM | POA: Diagnosis not present

## 2015-01-10 DIAGNOSIS — F250 Schizoaffective disorder, bipolar type: Secondary | ICD-10-CM | POA: Diagnosis present

## 2015-01-10 DIAGNOSIS — I10 Essential (primary) hypertension: Secondary | ICD-10-CM | POA: Insufficient documentation

## 2015-01-10 DIAGNOSIS — Z880 Allergy status to penicillin: Secondary | ICD-10-CM | POA: Diagnosis not present

## 2015-01-10 DIAGNOSIS — Z791 Long term (current) use of non-steroidal anti-inflammatories (NSAID): Secondary | ICD-10-CM | POA: Diagnosis not present

## 2015-01-10 DIAGNOSIS — Z8669 Personal history of other diseases of the nervous system and sense organs: Secondary | ICD-10-CM | POA: Insufficient documentation

## 2015-01-10 DIAGNOSIS — R45851 Suicidal ideations: Secondary | ICD-10-CM | POA: Diagnosis present

## 2015-01-10 DIAGNOSIS — F209 Schizophrenia, unspecified: Secondary | ICD-10-CM | POA: Insufficient documentation

## 2015-01-10 DIAGNOSIS — Z79899 Other long term (current) drug therapy: Secondary | ICD-10-CM | POA: Diagnosis not present

## 2015-01-10 DIAGNOSIS — R45850 Homicidal ideations: Secondary | ICD-10-CM | POA: Diagnosis present

## 2015-01-10 DIAGNOSIS — G4700 Insomnia, unspecified: Secondary | ICD-10-CM | POA: Diagnosis present

## 2015-01-10 DIAGNOSIS — Z87891 Personal history of nicotine dependence: Secondary | ICD-10-CM | POA: Insufficient documentation

## 2015-01-10 DIAGNOSIS — J45909 Unspecified asthma, uncomplicated: Secondary | ICD-10-CM | POA: Diagnosis present

## 2015-01-10 DIAGNOSIS — F200 Paranoid schizophrenia: Secondary | ICD-10-CM | POA: Diagnosis present

## 2015-01-10 DIAGNOSIS — Z7982 Long term (current) use of aspirin: Secondary | ICD-10-CM | POA: Diagnosis not present

## 2015-01-10 LAB — CBC
HCT: 37 % (ref 36.0–46.0)
Hemoglobin: 12.2 g/dL (ref 12.0–15.0)
MCH: 29 pg (ref 26.0–34.0)
MCHC: 33 g/dL (ref 30.0–36.0)
MCV: 87.9 fL (ref 78.0–100.0)
Platelets: 240 10*3/uL (ref 150–400)
RBC: 4.21 MIL/uL (ref 3.87–5.11)
RDW: 13.2 % (ref 11.5–15.5)
WBC: 6.2 10*3/uL (ref 4.0–10.5)

## 2015-01-10 LAB — ACETAMINOPHEN LEVEL: Acetaminophen (Tylenol), Serum: <10 ug/mL — ABNORMAL LOW (ref 10–30)

## 2015-01-10 LAB — COMPREHENSIVE METABOLIC PANEL
ALT: 28 U/L (ref 14–54)
ANION GAP: 10 (ref 5–15)
AST: 20 U/L (ref 15–41)
Albumin: 4.3 g/dL (ref 3.5–5.0)
Alkaline Phosphatase: 66 U/L (ref 38–126)
BUN: 12 mg/dL (ref 6–20)
CO2: 23 mmol/L (ref 22–32)
Calcium: 9.2 mg/dL (ref 8.9–10.3)
Chloride: 107 mmol/L (ref 101–111)
Creatinine, Ser: 0.8 mg/dL (ref 0.44–1.00)
GFR calc Af Amer: >60 mL/min (ref >60)
GFR calc non Af Amer: >60 mL/min (ref >60)
Glucose, Bld: 105 mg/dL — ABNORMAL HIGH (ref 65–99)
Potassium: 3.7 mmol/L (ref 3.5–5.1)
Sodium: 140 mmol/L (ref 135–145)
TOTAL PROTEIN: 8.1 g/dL (ref 6.5–8.1)
Total Bilirubin: 0.5 mg/dL (ref 0.3–1.2)

## 2015-01-10 LAB — RAPID URINE DRUG SCREEN, HOSP PERFORMED
AMPHETAMINES: NOT DETECTED
BARBITURATES: NOT DETECTED
BENZODIAZEPINES: NOT DETECTED
Cocaine: NOT DETECTED
Opiates: NOT DETECTED
Tetrahydrocannabinol: NOT DETECTED

## 2015-01-10 LAB — SALICYLATE LEVEL

## 2015-01-10 LAB — ETHANOL

## 2015-01-10 MED ORDER — BUDESONIDE-FORMOTEROL FUMARATE 160-4.5 MCG/ACT IN AERO
2.0000 | INHALATION_SPRAY | Freq: Two times a day (BID) | RESPIRATORY_TRACT | Status: DC
  Administered 2015-01-10 – 2015-01-20 (×18): 2 via RESPIRATORY_TRACT
  Filled 2015-01-10 (×2): qty 6

## 2015-01-10 MED ORDER — HYDROXYZINE HCL 25 MG PO TABS
25.0000 mg | ORAL_TABLET | Freq: Three times a day (TID) | ORAL | Status: DC | PRN
  Administered 2015-01-10: 25 mg via ORAL
  Filled 2015-01-10: qty 1
  Filled 2015-01-10: qty 6

## 2015-01-10 MED ORDER — MELOXICAM 7.5 MG PO TABS
7.5000 mg | ORAL_TABLET | Freq: Every day | ORAL | Status: DC
  Administered 2015-01-11 – 2015-01-20 (×10): 7.5 mg via ORAL
  Filled 2015-01-10 (×2): qty 1
  Filled 2015-01-10: qty 3
  Filled 2015-01-10 (×10): qty 1

## 2015-01-10 MED ORDER — ACETAMINOPHEN 325 MG PO TABS: 650.0000 mg | ORAL_TABLET | Freq: Four times a day (QID) | ORAL | Status: DC | PRN

## 2015-01-10 MED ORDER — ALUM & MAG HYDROXIDE-SIMETH 200-200-20 MG/5ML PO SUSP: 30.0000 mL | ORAL | Status: DC | PRN

## 2015-01-10 MED ORDER — MAGNESIUM HYDROXIDE 400 MG/5ML PO SUSP: 30.0000 mL | Freq: Every day | ORAL | Status: DC | PRN

## 2015-01-10 MED ORDER — PALIPERIDONE PALMITATE 273 MG/0.875ML IM SUSP: 1.0000 | INTRAMUSCULAR | Status: DC

## 2015-01-10 MED ORDER — BENZTROPINE MESYLATE 1 MG PO TABS
1.0000 mg | ORAL_TABLET | Freq: Every day | ORAL | Status: DC
  Administered 2015-01-10: 1 mg via ORAL
  Filled 2015-01-10 (×3): qty 1

## 2015-01-10 MED ORDER — RISPERIDONE 2 MG PO TABS
2.0000 mg | ORAL_TABLET | Freq: Every day | ORAL | Status: DC
  Administered 2015-01-10: 2 mg via ORAL
  Filled 2015-01-10 (×3): qty 1

## 2015-01-10 MED ORDER — DIVALPROEX SODIUM 500 MG PO DR TAB
1000.0000 mg | DELAYED_RELEASE_TABLET | Freq: Every day | ORAL | Status: DC
  Administered 2015-01-10: 1000 mg via ORAL
  Filled 2015-01-10 (×4): qty 2

## 2015-01-10 NOTE — BH Assessment (Signed)
Patient verbally consented for [name] to be contacted in reference to her current hospitalization, attempted to contact [name].  There is no listed telephone number for [name], attempted to contact listed persons to obtain telephone number.  [name] Father, [name] listed telephone number has been disconnected.  Spoke with [name], [name], who reports not speaking with the Patient for 2 to 3 weeks and not having a telephone number for [employer].  No one answered at [facility] telephone number and a voicemail message was not left.
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
## 2015-01-10 NOTE — ED Notes (Signed)
Report called to [facility]

## 2015-01-10 NOTE — BH Assessment (Signed)
Patient is a walk-in to [facility].  Per Nena Polio, PA:  Patient meets inpatient criteria and needs to be transported to [facility] for medical clearance.  Per Emeline General:  Patient assigned bed 505-2, admitting Dr. Shea Evans.  1715:  WLED Charge Nurse Stacy notified of Patient needing medical clearance and bed assignment and admitting doctor at [facility].
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
## 2015-01-10 NOTE — Tx Team (Signed)
Initial Interdisciplinary Treatment Plan   PATIENT STRESSORS: Financial difficulties Health problems Marital or family conflict   PATIENT STRENGTHS: Capable of independent living Curator fund of knowledge Supportive family/friends   PROBLEM LIST: Problem List/Patient Goals Date to be addressed Date deferred Reason deferred Estimated date of resolution  Anxiety      schizophrenia      Patient stated "I want to work on my anger and using profanity on people"                                           DISCHARGE CRITERIA:  Improved stabilization in mood, thinking, and/or behavior Motivation to continue treatment in a less acute level of care Reduction of life-threatening or endangering symptoms to within safe limits Verbal commitment to aftercare and medication compliance  PRELIMINARY DISCHARGE PLAN: Attend PHP/IOP Outpatient therapy Placement in alternative living arrangements  PATIENT/FAMIILY INVOLVEMENT: This treatment plan has been presented to and reviewed with the patient, Kaliah Haddaway, and/or family member.  The patient and family have been given the opportunity to ask questions and make suggestions.  Loyal Gambler B 01/10/2015, 11:15 PM

## 2015-01-11 ENCOUNTER — Encounter (HOSPITAL_COMMUNITY): Payer: Self-pay | Admitting: Psychiatry

## 2015-01-11 DIAGNOSIS — R45851 Suicidal ideations: Secondary | ICD-10-CM

## 2015-01-11 DIAGNOSIS — F250 Schizoaffective disorder, bipolar type: Secondary | ICD-10-CM | POA: Diagnosis present

## 2015-01-11 DIAGNOSIS — R45850 Homicidal ideations: Secondary | ICD-10-CM

## 2015-01-11 LAB — PREGNANCY, URINE: Preg Test, Ur: NEGATIVE

## 2015-01-11 MED ORDER — ARIPIPRAZOLE 5 MG PO TABS
5.0000 mg | ORAL_TABLET | ORAL | Status: DC
  Administered 2015-01-11 – 2015-01-13 (×5): 5 mg via ORAL
  Filled 2015-01-11 (×7): qty 1

## 2015-01-11 MED ORDER — DIVALPROEX SODIUM ER 500 MG PO TB24: 750.0000 mg | ORAL_TABLET | Freq: Every day | ORAL | Status: DC

## 2015-01-11 MED ORDER — DIVALPROEX SODIUM ER 500 MG PO TB24
1000.0000 mg | ORAL_TABLET | Freq: Every day | ORAL | Status: DC
  Administered 2015-01-11 – 2015-01-19 (×9): 1000 mg via ORAL
  Filled 2015-01-11: qty 6
  Filled 2015-01-11 (×11): qty 2

## 2015-01-11 MED ORDER — RISPERIDONE 1 MG PO TABS
1.0000 mg | ORAL_TABLET | Freq: Every day | ORAL | Status: DC
  Administered 2015-01-11: 1 mg via ORAL
  Filled 2015-01-11 (×3): qty 1

## 2015-01-11 MED ORDER — BENZTROPINE MESYLATE 0.5 MG PO TABS
0.5000 mg | ORAL_TABLET | ORAL | Status: DC
  Administered 2015-01-11 – 2015-01-14 (×6): 0.5 mg via ORAL
  Filled 2015-01-11 (×8): qty 1

## 2015-01-11 MED ORDER — TUBERCULIN PPD 5 UNIT/0.1ML ID SOLN
5.0000 [IU] | Freq: Once | INTRADERMAL | Status: AC
  Administered 2015-01-11: 5 [IU] via INTRADERMAL

## 2015-01-11 MED ORDER — TRAZODONE HCL 50 MG PO TABS
50.0000 mg | ORAL_TABLET | Freq: Every day | ORAL | Status: DC
  Administered 2015-01-11: 50 mg via ORAL
  Filled 2015-01-11 (×4): qty 1

## 2015-01-11 NOTE — Progress Notes (Signed)
Patient ID: [name], female   DOB: 10/23/1970, 44 y.o.   MRN: [id number] PER STATE REGULATIONS 482.30  THIS CHART WAS REVIEWED FOR MEDICAL NECESSITY WITH RESPECT TO THE [name] ADMISSION/DURATION OF STAY.  NEXT REVIEW DATE:01/14/15  Roma Schanz, RN, BSN CASE MANAGER

## 2015-01-11 NOTE — BHH Suicide Risk Assessment (Signed)
[facility] Admission Suicide Risk Assessment   Nursing information obtained from:    Demographic factors:    Current Mental Status:    Loss Factors:    Historical Factors:    Risk Reduction Factors:    Total Time spent with patient: 30 minutes Principal Problem: Schizoaffective disorder, bipolar type Diagnosis:   Patient Active Problem List   Diagnosis Date Noted  . Schizoaffective disorder, bipolar type [F25.0] 01/11/2015     Continued Clinical Symptoms:  Alcohol Use Disorder Identification Test Final Score (AUDIT): 0 The "Alcohol Use Disorders Identification Test", Guidelines for Use in Primary Care, Second Edition.  World Pharmacologist [facility]). Score between 0-7:  no or low risk or alcohol related problems. Score between 8-15:  moderate risk of alcohol related problems. Score between 16-19:  high risk of alcohol related problems. Score 20 or above:  warrants further diagnostic evaluation for alcohol dependence and treatment.   CLINICAL FACTORS:   Previous Psychiatric Diagnoses and Treatments   Musculoskeletal: Strength & Muscle Tone: within normal limits Gait & Station: normal Patient leans: N/A  Psychiatric Specialty Exam: Physical Exam  ROS  Blood pressure 127/82, pulse 98, temperature 98.1 F (36.7 C), temperature source Oral, resp. rate 16, height 6' 0.5" (1.842 m), weight 86.183 kg (190 lb).Body mass index is 25.4 kg/(m^2).                Please see H&P.                                          COGNITIVE FEATURES THAT CONTRIBUTE TO RISK:  Closed-mindedness, Polarized thinking and Thought constriction (tunnel vision)    SUICIDE RISK:   Moderate:  Frequent suicidal ideation with limited intensity, and duration, some specificity in terms of plans, no associated intent, good self-control, limited dysphoria/symptomatology, some risk factors present, and identifiable protective factors, including available and accessible social  support.  PLAN OF CARE: Please see H&P.   Medical Decision Making:  Review of Psycho-Social Stressors (1), Review or order clinical lab tests (1), Established Problem, Worsening (2), Review of Last Therapy Session (1), Review of Medication Regimen & Side Effects (2) and Review of New Medication or Change in Dosage (2)  I certify that inpatient services furnished can reasonably be expected to improve the patient's condition.   Rowene Suto MD 01/11/2015, 3:05 PM
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
## 2015-01-11 NOTE — Progress Notes (Signed)
Patient in her room, making her bed during this assessment. Her mood and affect was bright and elated. She endorsed a great day, stating her day was 6 on a scale of 1-10 with 10 the worst. She denied SI/HI and denied hallucinations. Writer encouraged and supported patient. Q 15 minute check continues for safety.

## 2015-01-11 NOTE — Progress Notes (Signed)
Patient ID: [name], female   DOB: 10/01/1970, 44 y.o.   MRN: [id number]  DAR: Pt. Denies SI and visual Hallucinations. Patient reports she hears, "someone who has passed" when asked who patient stated, "an inl aw." Patient reports HI towards no specific individual but reported irritability and agitation this morning. Writer spoke with patient 1:1 and offered therapeutic listening and conversation. Patient reported that lowered her agitation and patient shortly after came out of the room and attended group. Patient rates her depression and hopelessness at 10/10 and her anxiety 5/10. Patient reports sleep was fair last night, appetite is good, energy level is hyper (although objectively patient appears lethargic), and concentration level is poor. Patient reported head pain and received scheduled pain medication. Upon reassessment said she was okay even though her pain level did not change, patient refused any other intervention. Support and encouragement provided to the patient. Scheduled medication administered to patient per physician's orders. Patient is somewhat guarded but cooperative. Patient is seen in the dayroom during groups but otherwise stays in her room. Patient is seen interacting with her peers. Q15 minute checks are maintained for safety.
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
## 2015-01-11 NOTE — Progress Notes (Signed)
The focus of this group is to help patients review their daily goal of treatment and discuss progress on daily workbooks. Pt attended the evening group session and responded to all discussion prompts from the [place]. Pt shared that today was a good day on the unit and that she enjoyed the two groups she attended. Pt reported having no additional needs from Nursing Staff this evening. Pt was quick to volunteer suggestions and encouragement to her peers, sometimes becoming intrusive, but was easily redirected. Aliza outlined a broad range of goals for herself including personal, financial, academic, spiritual and so on. "I'm ready to work on me and be a better more independent person." Pt's affect was appropriate.

## 2015-01-11 NOTE — Plan of Care (Signed)
Problem: Alteration in thought process Goal: STG-Patient is able to follow short directions Outcome: Progressing Patient is able to follow short directions with prompting.

## 2015-01-12 MED ORDER — TRAZODONE HCL 100 MG PO TABS
100.0000 mg | ORAL_TABLET | Freq: Every day | ORAL | Status: DC
  Administered 2015-01-12 – 2015-01-18 (×7): 100 mg via ORAL
  Filled 2015-01-12 (×9): qty 1
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
## 2015-01-12 NOTE — BHH Group Notes (Signed)
[facility] Group Notes:  (Counselor/Nursing/MHT/Case Management/Adjunct)  01/12/2015 1:15PM  Type of Therapy:  Group Therapy  Participation Level:  Active  Participation Quality:  Appropriate  Affect:  Flat  Cognitive:  Oriented  Insight:  Improving  Engagement in Group:  Limited  Engagement in Therapy:  Limited  Modes of Intervention:  Discussion, Exploration and Socialization  Summary of Progress/Problems: The topic for group was balance in life.  Pt participated in the discussion about when their life was in balance and out of balance and how this feels.  Pt discussed ways to get back in balance and short term goals they can work on to get where they want to be. [name] was actively involved the entire group.  Unfortunately, this generally translated into interrupting others, and rhyming other words with a word someone else had just said, usually not germaine to the discussion.  However, she is redirectable and is in a good mood.   Trish Mage 01/12/2015 3:32 PM
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
## 2015-01-12 NOTE — Progress Notes (Signed)
D-pt sts shes not sleeping well at night, pt was very talkative during group, flight of ideas, pt seemed manic and talked about having anger & guilt within her family A-pt took her medications and was very active during group R-cont to monitor for safety

## 2015-01-12 NOTE — Progress Notes (Signed)
Patient remains bizarre during this assessment. Her thought process disorganized and she was very childlike. She endorsed a good day and denied SI/HI. Patient also denied a/v hallucinations. She seemed to be hyper verbal. Writer encouraged and supported patient. Q 15 minute check continues as ordered to maintain safety.

## 2015-01-12 NOTE — Progress Notes (Signed)
[facility] MD Progress Note  01/12/2015 2:48 PM [name]  MRN:  [id number] Subjective: Patient states " I am OK today , my thoughts are better, I am still not sleeping well.'  Objective: [name] is a 44 year old AA female who presented to the [facility] after she was referred there by her family. Patient per initial notes in EHR was having HI towards her Uncle . Patient seen and chart reviewed.Discussed patient with treatment team. Patient with some improvement of her sx today. Pt is not as irritable as yesterday , is more calm , her speech is linear. Pt does have sleep issues. Discussed increasing her medications tonight. Pt is agreeable with it. Pt denies any SI today. Pt when questioned about HI -states "not so much' Pt to be referred to [facility] has started working on referral process. Pt encouraged to attend groups . Pt denies any ADR 's of medications today.    Principal Problem: Schizoaffective disorder, bipolar type Diagnosis:   Patient Active Problem List   Diagnosis Date Noted  . Schizoaffective disorder, bipolar type [F25.0] 01/11/2015      Total Time spent with patient: 30 minutes   Past Medical History:  Past Medical History  Diagnosis Date  . Anemia   . Asthma   . Hypertension   . Schizophrenic disorder   . Seizures   . Anxiety   . Depression   . Insomnia, persistent   . Bipolar 1 disorder     Past Surgical History  Procedure Laterality Date  . Tonsillectomy     Family History:  Family History  Problem Relation Age of Onset  . Depression Mother   . Gout Mother    Social History:  History  Alcohol Use No     History  Drug Use No    History   Social History  . Marital Status: Single    Spouse Name: N/A  . Number of Children: N/A  . Years of Education: N/A   Social History Main Topics  . Smoking status: Former Research scientist (life sciences)  . Smokeless tobacco: Never Used  . Alcohol Use: No  . Drug Use: No  . Sexual Activity: No   Other Topics Concern  . None    Social History Narrative   Additional History:    Sleep: Poor  Appetite:  Fair     Musculoskeletal: Strength & Muscle Tone: within normal limits Gait & Station: normal Patient leans: N/A   Psychiatric Specialty Exam: Physical Exam  Review of Systems  Psychiatric/Behavioral: Positive for depression. The patient is nervous/anxious and has insomnia.   All other systems reviewed and are negative.   Blood pressure 115/79, pulse 107, temperature 98 F (36.7 C), temperature source Oral, resp. rate 18, height 6' 0.5" (1.842 m), weight 86.183 kg (190 lb).Body mass index is 25.4 kg/(m^2).  General Appearance: Fairly Groomed  Engineer, water::  Fair  Speech:  Normal Rate  Volume:  Normal  Mood:  less irritable  Affect:  Congruent  Thought Process:  Linear  Orientation:  Full (Time, Place, and Person)  Thought Content:  Paranoid Ideation and Rumination  Suicidal Thoughts:  No  Homicidal Thoughts:  not so much  Memory:  Immediate;   Fair Recent;   Fair Remote;   Fair  Judgement:  Impaired  Insight:  Shallow  Psychomotor Activity:  Normal  Concentration:  Fair  Recall:  [place]  Language: Fair  Akathisia:  No  Handed:  Right  AIMS (if indicated):  Assets:  Communication Skills Desire for Improvement Physical Health  ADL's:  Intact  Cognition: WNL  Sleep:        Current Medications: Current Facility-Administered Medications  Medication Dose Route Frequency Provider Last Rate Last Dose  . acetaminophen (TYLENOL) tablet 650 mg  650 mg Oral Q6H PRN Harriet Butte, NP      . alum & mag hydroxide-simeth (MAALOX/MYLANTA) 200-200-20 MG/5ML suspension 30 mL  30 mL Oral Q4H PRN Harriet Butte, NP      . ARIPiprazole (ABILIFY) tablet 5 mg  5 mg Oral BH-qamhs Ursula Alert, MD   5 mg at 01/12/15 0821  . benztropine (COGENTIN) tablet 0.5 mg  0.5 mg Oral BH-qamhs Yvette Deland, MD   0.5 mg at 01/12/15 0821  . budesonide-formoterol (SYMBICORT) 160-4.5  MCG/ACT inhaler 2 puff  2 puff Inhalation BID Harriet Butte, NP   2 puff at 01/12/15 0821  . divalproex (DEPAKOTE ER) 24 hr tablet 1,000 mg  1,000 mg Oral QHS Yvette Boesch, MD   1,000 mg at 01/11/15 2107  . hydrOXYzine (ATARAX/VISTARIL) tablet 25 mg  25 mg Oral TID PRN Harriet Butte, NP   25 mg at 01/10/15 2241  . magnesium hydroxide (MILK OF MAGNESIA) suspension 30 mL  30 mL Oral Daily PRN Harriet Butte, NP      . meloxicam (MOBIC) tablet 7.5 mg  7.5 mg Oral Daily Harriet Butte, NP   7.5 mg at 01/12/15 0821  . traZODone (DESYREL) tablet 100 mg  100 mg Oral QHS Ursula Alert, MD      . tuberculin injection 5 Units  5 Units Intradermal Once Ursula Alert, MD   5 Units at 01/11/15 1259    Lab Results:  Results for orders placed or performed during the hospital encounter of 01/10/15 (from the past 48 hour(s))  Pregnancy, urine     Status: None   Collection Time: 01/11/15  7:29 PM  Result Value Ref Range   Preg Test, Ur NEGATIVE NEGATIVE    Comment:        THE SENSITIVITY OF THIS METHODOLOGY IS >20 mIU/mL. Performed at [facility]     Physical Findings: AIMS:  , ,  ,  ,    CIWA:    COWS:     Assessment: Patient is a 44 year old AAF who presented with psychosis, mood lability, HI towards family members, pt with past hx of schizoaffective do , will benefit from treatment . Continue plan.   Treatment Plan Summary: Daily contact with patient to assess and evaluate symptoms and progress in treatment and Medication management Will continue Abilify to  5 mg po BH QAM and qhs . Plan to cross taper Risperdal with Abilify.  Continue Cogentin 0.5 mg po bid for EPS. Will continue Depakote 1000 mg po qhs for mood sx. Depakote level reviewed - 12/15/14- THERAPEUTIC- 59.6. But since she was getting a one time dose - will change DR to ER. Repeat Depakote level on 01/15/15. Will continue Vistaril 25 mg po tid prn for anxiety sx. Will increase Trazodone to 100 mg po qhs for  sleep. Will continue to monitor vitals ,medication compliance and treatment side effects while patient is here.  Will monitor for medical issues as well as call consult as needed.  Urine preg test - negative, EKG - reviewed. CSW will start working on disposition. Patient to be referred to [facility].PPD given on 01/11/15. Patient to participate in therapeutic milieu .   Medical  Decision Making:  Review of Psycho-Social Stressors (1), Review or order clinical lab tests (1), Review of Last Therapy Session (1), Review of Medication Regimen & Side Effects (2) and Review of New Medication or Change in Dosage (2)     Canaan Holzer MD 01/12/2015, 2:48 PM

## 2015-01-13 LAB — GLUCOSE, CAPILLARY
GLUCOSE-CAPILLARY: 94 mg/dL (ref 65–99)
Glucose-Capillary: 113 mg/dL — ABNORMAL HIGH (ref 65–99)

## 2015-01-13 MED ORDER — ARIPIPRAZOLE 10 MG PO TABS
10.0000 mg | ORAL_TABLET | ORAL | Status: DC
Start: 2015-01-13 — End: 2015-01-14
  Administered 2015-01-13 – 2015-01-14 (×2): 10 mg via ORAL
  Filled 2015-01-13 (×4): qty 1

## 2015-01-13 NOTE — Progress Notes (Signed)
[facility] MD Progress Note  01/13/2015 12:20 PM [name]  MRN:  [id number] Subjective: Patient states " I am trying to be a christian and be calm and be patient , I don't want to be a Muslim . I am getting agitated with the staff here, they keep telling me to go there and come back and move around, I am tired of this .'   Objective: [name] is a 44 year old AA female who presented to the [facility] after she was referred there by her family. Patient per initial notes in EHR was having HI towards her Uncle . Patient seen and chart reviewed.Discussed patient with treatment team. Patient appears to be very irritable today . Pt with flight of ideas tangentiality as well as irritability. Pt upset with staff and has several complaints today. Pt reports sleep as OK. Trazodone has been helpful, she woke up just once last night. Pt encouraged to take medications and attend group activities.  Discussed with patient about medication changes , that her Abilify will be increased today to target her mood sx. Pt denies any ADR 's of medications today. CSW continues to wok on [facility] placement. Pt to be evaluated today for placement.   Principal Problem: Schizoaffective disorder, bipolar type Diagnosis:   Patient Active Problem List   Diagnosis Date Noted  . Schizoaffective disorder, bipolar type [F25.0] 01/11/2015      Total Time spent with patient: 30 minutes   Past Medical History:  Past Medical History  Diagnosis Date  . Anemia   . Asthma   . Hypertension   . Schizophrenic disorder   . Seizures   . Anxiety   . Depression   . Insomnia, persistent   . Bipolar 1 disorder     Past Surgical History  Procedure Laterality Date  . Tonsillectomy     Family History:  Family History  Problem Relation Age of Onset  . Depression Mother   . Gout Mother    Social History:  History  Alcohol Use No     History  Drug Use No    History   Social History  . Marital Status: Single    Spouse Name:  N/A  . Number of Children: N/A  . Years of Education: N/A   Social History Main Topics  . Smoking status: Former Research scientist (life sciences)  . Smokeless tobacco: Never Used  . Alcohol Use: No  . Drug Use: No  . Sexual Activity: No   Other Topics Concern  . None   Social History Narrative   Additional History:    Sleep: Poor  Appetite:  Fair     Musculoskeletal: Strength & Muscle Tone: within normal limits Gait & Station: normal Patient leans: N/A   Psychiatric Specialty Exam: Physical Exam  Review of Systems  Psychiatric/Behavioral: The patient is nervous/anxious and has insomnia.   All other systems reviewed and are negative.   Blood pressure 128/90, pulse 104, temperature 98 F (36.7 C), temperature source Oral, resp. rate 18, height 6' 0.5" (1.842 m), weight 86.183 kg (190 lb).Body mass index is 25.4 kg/(m^2).  General Appearance: Fairly Groomed  Engineer, water::  Fair  Speech:  Normal Rate  Volume:  Normal  Mood:  Anxious, Hopeless and Irritable  Affect:  Congruent  Thought Process:  Tangential   Orientation:  Full (Time, Place, and Person)  Thought Content:  Paranoid Ideation and Rumination  Suicidal Thoughts:  No  Homicidal Thoughts:  No  Memory:  Immediate;   Fair Recent;  Fair Remote;   Fair  Judgement:  Impaired  Insight:  Shallow  Psychomotor Activity:  Normal  Concentration:  Fair  Recall:  [employer] of Knowledge:Fair  Language: Fair  Akathisia:  No  Handed:  Right  AIMS (if indicated):     Assets:  Communication Skills Desire for Improvement Physical Health  ADL's:  Intact  Cognition: WNL  Sleep:        Current Medications: Current Facility-Administered Medications  Medication Dose Route Frequency Provider Last Rate Last Dose  . acetaminophen (TYLENOL) tablet 650 mg  650 mg Oral Q6H PRN Harriet Butte, NP      . alum & mag hydroxide-simeth (MAALOX/MYLANTA) 200-200-20 MG/5ML suspension 30 mL  30 mL Oral Q4H PRN Harriet Butte, NP      .  ARIPiprazole (ABILIFY) tablet 10 mg  10 mg Oral BH-qamhs Cher Franzoni, MD      . benztropine (COGENTIN) tablet 0.5 mg  0.5 mg Oral BH-qamhs Kamiya Acord, MD   0.5 mg at 01/13/15 0816  . budesonide-formoterol (SYMBICORT) 160-4.5 MCG/ACT inhaler 2 puff  2 puff Inhalation BID Harriet Butte, NP   2 puff at 01/13/15 0817  . divalproex (DEPAKOTE ER) 24 hr tablet 1,000 mg  1,000 mg Oral QHS Laikynn Pollio, MD   1,000 mg at 01/12/15 2145  . hydrOXYzine (ATARAX/VISTARIL) tablet 25 mg  25 mg Oral TID PRN Harriet Butte, NP   25 mg at 01/10/15 2241  . magnesium hydroxide (MILK OF MAGNESIA) suspension 30 mL  30 mL Oral Daily PRN Harriet Butte, NP      . meloxicam (MOBIC) tablet 7.5 mg  7.5 mg Oral Daily Harriet Butte, NP   7.5 mg at 01/13/15 0816  . traZODone (DESYREL) tablet 100 mg  100 mg Oral QHS Ursula Alert, MD   100 mg at 01/12/15 2147  . tuberculin injection 5 Units  5 Units Intradermal Once Ursula Alert, MD   5 Units at 01/11/15 1259    Lab Results:  Results for orders placed or performed during the hospital encounter of 01/10/15 (from the past 48 hour(s))  Pregnancy, urine     Status: None   Collection Time: 01/11/15  7:29 PM  Result Value Ref Range   Preg Test, Ur NEGATIVE NEGATIVE    Comment:        THE SENSITIVITY OF THIS METHODOLOGY IS >20 mIU/mL. Performed at [facility]     Physical Findings: AIMS:  , ,  ,  ,    CIWA:    COWS:     Assessment: Patient is a 44 year old AAF who presented with psychosis, mood lability, HI towards family members, pt with past hx of schizoaffective do . Pt today with irritability , has several complaints about staff and her situation. Pt will benefit from treatment . Continue plan.   Treatment Plan Summary: Daily contact with patient to assess and evaluate symptoms and progress in treatment and Medication management Will increase  Abilify to 10 mg po BH QAM and qhs .Risperdal discontinued due to lack of efficacy.   Continue Cogentin 0.5 mg po bid for EPS. Will continue Depakote 1000 mg po qhs for mood sx. Depakote level reviewed - 12/15/14- THERAPEUTIC- 59.6. But since she was getting a one time dose - will change DR to ER. Repeat Depakote level on 01/15/15. Will continue Vistaril 25 mg po tid prn for anxiety sx. Will continue Trazodone  100 mg po qhs for sleep.  Will continue to monitor vitals ,medication compliance and treatment side effects while patient is here.  Will monitor for medical issues as well as call consult as needed.  CSW will start working on disposition. Patient to be referred to [facility].PPD given on 01/11/15. Patient to participate in therapeutic milieu .   Medical Decision Making:  Review of Psycho-Social Stressors (1), Review of Last Therapy Session (1), Review of Medication Regimen & Side Effects (2) and Review of New Medication or Change in Dosage (2)     Miracle Mongillo MD 01/13/2015, 12:20 PM

## 2015-01-13 NOTE — Progress Notes (Signed)
D: Pt's mood is labile. She rates her depression 5/10 and hopelessness 0/10. She denies SI/HI/AVH. She took her medications and attended group this morning.  A: Support given. Verbalization encouraged. Pt encouraged to come to staff for any concerns. Medications given as prescribed. R: Pt is receptive. No complaints of pain or discomfort at this time. Q15 min safety checks maintained. Pt remains safe on the unit. Will continue to monitor.

## 2015-01-14 LAB — GLUCOSE, CAPILLARY
Glucose-Capillary: 84 mg/dL (ref 65–99)
Glucose-Capillary: 86 mg/dL (ref 65–99)
Glucose-Capillary: 91 mg/dL (ref 65–99)

## 2015-01-14 MED ORDER — BENZTROPINE MESYLATE 0.5 MG PO TABS
0.5000 mg | ORAL_TABLET | Freq: Every day | ORAL | Status: DC
  Administered 2015-01-14 – 2015-01-19 (×6): 0.5 mg via ORAL
  Filled 2015-01-14 (×7): qty 1
  Filled 2015-01-14: qty 3

## 2015-01-14 MED ORDER — ARIPIPRAZOLE 10 MG PO TABS: 10.0000 mg | ORAL_TABLET | Freq: Once | ORAL | Status: DC

## 2015-01-14 MED ORDER — ARIPIPRAZOLE 10 MG PO TABS: 20.0000 mg | ORAL_TABLET | Freq: Every day | ORAL | Status: DC

## 2015-01-14 MED ORDER — ARIPIPRAZOLE 15 MG PO TABS
25.0000 mg | ORAL_TABLET | Freq: Every day | ORAL | Status: DC
  Administered 2015-01-15: 25 mg via ORAL
  Filled 2015-01-14 (×3): qty 1

## 2015-01-14 MED ORDER — ARIPIPRAZOLE 15 MG PO TABS
15.0000 mg | ORAL_TABLET | Freq: Once | ORAL | Status: AC
  Administered 2015-01-14: 15 mg via ORAL
  Filled 2015-01-14: qty 1

## 2015-01-14 NOTE — Progress Notes (Signed)
Patient ID: [name], female   DOB: 1971/06/05, 44 y.o.   MRN: [id number]  Writer assumed care of patient at 1530, patient has been observed by this writer in the milieu today and attending morning nursing group. Patient appears tangential and often makes statements loosely related to the topic. Patient is irritable on interaction with this writer initially but after dinner patient is pleasant. Patient is seen dancing and singing. Patient denies SI/HI/A/V hallucinations and no pain. Support and encouragement provided. Patient takes prescribed medications as scheduled. Q15 minute safety checks are continued for this patient.

## 2015-01-14 NOTE — Progress Notes (Signed)
Psychoeducational Group Note  Date:  01/14/2015 Time: 1015  Group Topic/Focus:  Identifying  Leisure Activities :  The focus of the group it to help patients identify what activities they can use for leisure time and how they can incorporate these activities regularly into their lives. Participation Level:  Active  Participation Quality: good  Affect: flat  Cognitive:  intact  Insight:  good  Engagement in Group: engaged  Additional Comments:   PD RN [facility]

## 2015-01-14 NOTE — Progress Notes (Signed)
Patient in bed for the most part of the evening. She came to the window for her HS medications. She reported that she felt the Abilify was not working well with her and she doesn't like how she feels with the medication. Writer noticed that Probation officer has been more irritable and isolative too. Patient seemed to be pleasant and more active in the mileu on Monday and Tuesday than today. She woke up very angry and irritable  In the morning. She denied SI/HI and denied Hallucinations. Q 15 minute check continues as ordered to maintain safety.

## 2015-01-14 NOTE — Progress Notes (Signed)
[name] MD Progress Note  01/14/2015 12:22 PM [name]  MRN:  [id number] Subjective: Patient states " I am tired of this , why are you trying to induce bleeding. I am trying to be calm . I don't want to be on Abilify , "abilify if for men". There was a study done in [place] , you keep doing what you need to do."   Objective: [name] is a 44 year old AA female who presented to the [facility] after she was referred there by her family. Patient per initial notes in EHR was having HI towards her Uncle .  Patient seen and chart reviewed.Discussed patient with treatment team. Patient today appeared to be irritable . Pt also appeared to be tangential , talking about "abilify" - that it is a medication for men and also that she is bleeding from her heart. Pt is currently having her menstrual period - and appears to be very distressed that she does not have proper under pants to hold her sanitary pads .  Discussed with patient about adding another medication to help with sx. Pt has tried several other medications in the past including haldol, prolixin, risperdal,invega , zyprexa, seroquel. Pt states she is willing to be continued on abilify - after she was provided with medication education. Pt continues to tangential , with flight of ideas. Pt also with side effects of sedation during daytime on the current medications - hence discussed changing her abilify dose to bedtime , she agrees with plan. Pt encouraged to take medications and attend group activities.    Principal Problem: Schizoaffective disorder, bipolar type Diagnosis:   Patient Active Problem List   Diagnosis Date Noted  . Schizoaffective disorder, bipolar type [F25.0] 01/11/2015      Total Time spent with patient: 30 minutes   Past Medical History:  Past Medical History  Diagnosis Date  . Anemia   . Asthma   . Hypertension   . Schizophrenic disorder   . Seizures   . Anxiety   . Depression   . Insomnia, persistent   .  Bipolar 1 disorder     Past Surgical History  Procedure Laterality Date  . Tonsillectomy     Family History:  Family History  Problem Relation Age of Onset  . Depression Mother   . Gout Mother    Social History:  History  Alcohol Use No     History  Drug Use No    History   Social History  . Marital Status: Single    Spouse Name: N/A  . Number of Children: N/A  . Years of Education: N/A   Social History Main Topics  . Smoking status: Former Research scientist (life sciences)  . Smokeless tobacco: Never Used  . Alcohol Use: No  . Drug Use: No  . Sexual Activity: No   Other Topics Concern  . None   Social History Narrative   Additional History:    Sleep: Fair  Appetite:  Fair     Musculoskeletal: Strength & Muscle Tone: within normal limits Gait & Station: normal Patient leans: N/A   Psychiatric Specialty Exam: Physical Exam  Review of Systems  Psychiatric/Behavioral: The patient is nervous/anxious.   All other systems reviewed and are negative.   Blood pressure 135/88, pulse 110, temperature 98.1 F (36.7 C), temperature source Oral, resp. rate 20, height 6' 0.5" (1.842 m), weight 86.183 kg (190 lb), SpO2 100 %.Body mass index is 25.4 kg/(m^2).  General Appearance: Fairly Groomed  Engineer, water::  [place]  Speech:  Normal Rate  Volume:  Normal  Mood:  Anxious, Hopeless and Irritable mostly irritable today  Affect:  Congruent  Thought Process:  Disorganized and Tangential   Orientation:  Full (Time, Place, and Person)  Thought Content:  Paranoid Ideation and Rumination  Suicidal Thoughts:  No  Homicidal Thoughts:  No  Memory:  Immediate;   Fair Recent;   Fair Remote;   Fair  Judgement:  Impaired  Insight:  Shallow  Psychomotor Activity:  Normal  Concentration:  Fair  Recall:  [employer] of Knowledge:Fair  Language: Fair  Akathisia:  No  Handed:  Right  AIMS (if indicated):     Assets:  Communication Skills Desire for Improvement Physical Health  ADL's:  Intact   Cognition: WNL  Sleep:  Number of Hours: 6     Current Medications: Current Facility-Administered Medications  Medication Dose Route Frequency Provider Last Rate Last Dose  . acetaminophen (TYLENOL) tablet 650 mg  650 mg Oral Q6H PRN Harriet Butte, NP      . alum & mag hydroxide-simeth (MAALOX/MYLANTA) 200-200-20 MG/5ML suspension 30 mL  30 mL Oral Q4H PRN Harriet Butte, NP      . ARIPiprazole (ABILIFY) tablet 15 mg  15 mg Oral Once Ursula Alert, MD      . Derrill Memo ON 01/15/2015] ARIPiprazole (ABILIFY) tablet 25 mg  25 mg Oral QHS Eastin Swing, MD      . benztropine (COGENTIN) tablet 0.5 mg  0.5 mg Oral QHS Florena Kozma, MD      . budesonide-formoterol (SYMBICORT) 160-4.5 MCG/ACT inhaler 2 puff  2 puff Inhalation BID Harriet Butte, NP   2 puff at 01/14/15 0953  . divalproex (DEPAKOTE ER) 24 hr tablet 1,000 mg  1,000 mg Oral QHS Ursula Alert, MD   1,000 mg at 01/13/15 2205  . hydrOXYzine (ATARAX/VISTARIL) tablet 25 mg  25 mg Oral TID PRN Harriet Butte, NP   25 mg at 01/10/15 2241  . magnesium hydroxide (MILK OF MAGNESIA) suspension 30 mL  30 mL Oral Daily PRN Harriet Butte, NP      . meloxicam (MOBIC) tablet 7.5 mg  7.5 mg Oral Daily Harriet Butte, NP   7.5 mg at 01/14/15 0953  . traZODone (DESYREL) tablet 100 mg  100 mg Oral QHS Ursula Alert, MD   100 mg at 01/13/15 2205    Lab Results:  Results for orders placed or performed during the hospital encounter of 01/10/15 (from the past 48 hour(s))  Glucose, capillary     Status: None   Collection Time: 01/13/15  4:51 PM  Result Value Ref Range   Glucose-Capillary 94 65 - 99 mg/dL   Comment 1 Notify RN    Comment 2 Document in Chart   Glucose, capillary     Status: Abnormal   Collection Time: 01/13/15  8:33 PM  Result Value Ref Range   Glucose-Capillary 113 (H) 65 - 99 mg/dL  Glucose, capillary     Status: None   Collection Time: 01/14/15  6:25 AM  Result Value Ref Range   Glucose-Capillary 84 65 - 99 mg/dL     Physical Findings: AIMS:  , ,  ,  ,    CIWA:    COWS:     Assessment: Patient is a 44 year old AAF who presented with psychosis, mood lability, HI towards family members, pt with past hx of schizoaffective do . Pt today with irritability , as well as tangentiality. Pt will  continue to benefit from treatment.   Treatment Plan Summary: Daily contact with patient to assess and evaluate symptoms and progress in treatment and Medication management Will increase  Abilify to 25 mg po qhs. Continue Cogentin 0.5 mg po qhs  for EPS. Will continue Depakote 1000 mg po qhs for mood sx. Depakote level reviewed - 12/15/14- THERAPEUTIC- 59.6. But since she was getting a one time dose - will change DR to ER. Repeat Depakote level on 01/15/15. Plan to add a second mood stabilizer , if she continues to be irritable. Will continue Vistaril 25 mg po tid prn for anxiety sx. Will continue Trazodone  100 mg po qhs for sleep. Will continue to monitor vitals ,medication compliance and treatment side effects while patient is here.  Will monitor for medical issues as well as call consult as needed.  CSW will start working on disposition. Patient to be referred to [facility]PPD given on 01/11/15. Patient to participate in therapeutic milieu .   Medical Decision Making:  Review of Psycho-Social Stressors (1), Review of Last Therapy Session (1), Review of Medication Regimen & Side Effects (2) and Review of New Medication or Change in Dosage (2)     Reighlyn Elmes MD 01/14/2015, 12:22 PM

## 2015-01-14 NOTE — [facility] Group Notes (Signed)
[facility] Group Notes:  (Counselor/Nursing/MHT/Case Management/Adjunct)  01/14/2015 1:15PM  Type of Therapy:  Group Therapy  Participation Level:  Active  Participation Quality:  Appropriate  Affect:  Flat  Cognitive:  Oriented  Insight:  Improving  Engagement in Group:  Limited  Engagement in Therapy:  Limited  Modes of Intervention:  Discussion, Exploration and Socialization  Summary of Progress/Problems: The topic for group was balance in life.  Pt participated in the discussion about when their life was in balance and out of balance and how this feels.  Pt discussed ways to get back in balance and short term goals they can work on to get where they want to be. [name] was all over the place today.  When asked about balance or unbalance, she stated she is "some timesy."  "I'm doing the flip and twirl.  I've had needles stuck all over my body, and it wasn't tattoos." Decided she finds balance through contemplation or meditation and went on a long, tangential explanation about the difference between two.   Roque Lias B 01/14/2015 2:56 PM

## 2015-01-14 NOTE — Progress Notes (Signed)
Patient ID: [name], female   DOB: 1971/05/07, 44 y.o.   MRN: [id number] PER STATE REGULATIONS 482.30  THIS CHART WAS REVIEWED FOR MEDICAL NECESSITY WITH RESPECT TO THE [name] ADMISSION/ DURATION OF STAY.  NEXT REVIEW DATE: 01/18/2015  Chauncy Lean, RN, BSN CASE MANAGER

## 2015-01-14 NOTE — Progress Notes (Signed)
The patient attended the entire group and participated appropriately.

## 2015-01-15 LAB — LIPID PANEL
Cholesterol: 172 mg/dL (ref 0–200)
HDL: 39 mg/dL — ABNORMAL LOW
LDL Cholesterol: 111 mg/dL — ABNORMAL HIGH (ref 0–99)
Total CHOL/HDL Ratio: 4.4 ratio
Triglycerides: 109 mg/dL
VLDL: 22 mg/dL (ref 0–40)

## 2015-01-15 LAB — GLUCOSE, CAPILLARY
Glucose-Capillary: 83 mg/dL (ref 65–99)
Glucose-Capillary: 76 mg/dL (ref 65–99)

## 2015-01-15 LAB — VALPROIC ACID LEVEL: VALPROIC ACID LVL: 77 ug/mL (ref 50.0–100.0)

## 2015-01-15 NOTE — Progress Notes (Signed)
D: When asked about her day pt stated, "I'm mad, I'm pissed". When asked why she was upset pt stated, "I don't know. Just being myself". When asked if she explain the conversation she'd had with her doctor, pt stated, "Some questions they trying to get to. Trying to pair me up but I stay away myself". Then pt began to speak her husband and the fact that she still loves him.  A:  Support and encouragement was offered. 15 min checks continued for safety.  R: Pt remains safe.

## 2015-01-15 NOTE — Progress Notes (Addendum)
[facility] MD Progress Note  01/15/2015 12:52 PM [name]  MRN:  [id number] Subjective: Patient states " I am OK today . I still feel my mood is low , but I don't know what to think. I don't want to talk about the bleeding today.'     Objective: [name] is a 44 year old AA female who presented to the [facility] after she was referred there by her family. Patient per initial notes in EHR was having HI towards her Uncle .  Patient seen and chart reviewed.Discussed patient with treatment team. Patient today is less irritable than yesterday. Pt continues to be tangential , however appears to be less disorganized than yesterday when she was very irritable with loose thought process. Pt today rates her depression at 2/10 - states "I feel low". Pt did not express any concerns about her "bleeding " today, states " I don't want to talk about it.' Pt reports sleep as OK. Pt denies any ADRs of medications. Pt with no disruptive issues noted on unit . Depakote level repeat - today - therapeutic. Pt to be referred to GH/ALF on DC.     Principal Problem: Schizoaffective disorder, bipolar type Diagnosis:   Patient Active Problem List   Diagnosis Date Noted  . Schizoaffective disorder, bipolar type [F25.0] 01/11/2015      Total Time spent with patient: 30 minutes   Past Medical History:  Past Medical History  Diagnosis Date  . Anemia   . Asthma   . Hypertension   . Schizophrenic disorder   . Seizures   . Anxiety   . Depression   . Insomnia, persistent   . Bipolar 1 disorder     Past Surgical History  Procedure Laterality Date  . Tonsillectomy     Family History:  Family History  Problem Relation Age of Onset  . Depression Mother   . Gout Mother    Social History:  History  Alcohol Use No     History  Drug Use No    History   Social History  . Marital Status: Single    Spouse Name: N/A  . Number of Children: N/A  . Years of Education: N/A   Social History Main Topics   . Smoking status: Former Research scientist (life sciences)  . Smokeless tobacco: Never Used  . Alcohol Use: No  . Drug Use: No  . Sexual Activity: No   Other Topics Concern  . None   Social History Narrative   Additional History:    Sleep: Fair  Appetite:  Fair     Musculoskeletal: Strength & Muscle Tone: within normal limits Gait & Station: normal Patient leans: N/A   Psychiatric Specialty Exam: Physical Exam  Review of Systems  Psychiatric/Behavioral: Positive for depression.  All other systems reviewed and are negative.   Blood pressure 140/82, pulse 92, temperature 97.8 F (36.6 C), temperature source Oral, resp. rate 16, height 6' 0.5" (1.842 m), weight 86.183 kg (190 lb), SpO2 100 %.Body mass index is 25.4 kg/(m^2).  General Appearance: Fairly Groomed  Engineer, water::  Fair  Speech:  Normal Rate  Volume:  Normal  Mood:  Anxious, Depressed and Irritable improving  Affect:  Congruent  Thought Process:  Disorganized and Tangential with some improvement today  Orientation:  Full (Time, Place, and Person)  Thought Content:  Paranoid Ideation and Rumination  Suicidal Thoughts:  No  Homicidal Thoughts:  No  Memory:  Immediate;   Fair Recent;   Fair Remote;   Fair  Judgement:  Impaired  Insight:  Shallow  Psychomotor Activity:  Normal  Concentration:  Fair  Recall:  [employer] of Knowledge:Fair  Language: Fair  Akathisia:  No  Handed:  Right  AIMS (if indicated):     Assets:  Communication Skills Desire for Improvement Physical Health  ADL's:  Intact  Cognition: WNL  Sleep:  Number of Hours: 6.25     Current Medications: Current Facility-Administered Medications  Medication Dose Route Frequency Provider Last Rate Last Dose  . acetaminophen (TYLENOL) tablet 650 mg  650 mg Oral Q6H PRN Harriet Butte, NP      . alum & mag hydroxide-simeth (MAALOX/MYLANTA) 200-200-20 MG/5ML suspension 30 mL  30 mL Oral Q4H PRN Harriet Butte, NP      . ARIPiprazole (ABILIFY) tablet 25 mg  25  mg Oral QHS Jaekwon Mcclune, MD      . benztropine (COGENTIN) tablet 0.5 mg  0.5 mg Oral QHS Ursula Alert, MD   0.5 mg at 01/14/15 2135  . budesonide-formoterol (SYMBICORT) 160-4.5 MCG/ACT inhaler 2 puff  2 puff Inhalation BID Harriet Butte, NP   2 puff at 01/15/15 0837  . divalproex (DEPAKOTE ER) 24 hr tablet 1,000 mg  1,000 mg Oral QHS Sol Englert, MD   1,000 mg at 01/14/15 2135  . hydrOXYzine (ATARAX/VISTARIL) tablet 25 mg  25 mg Oral TID PRN Harriet Butte, NP   25 mg at 01/10/15 2241  . magnesium hydroxide (MILK OF MAGNESIA) suspension 30 mL  30 mL Oral Daily PRN Harriet Butte, NP      . meloxicam (MOBIC) tablet 7.5 mg  7.5 mg Oral Daily Harriet Butte, NP   7.5 mg at 01/15/15 0837  . traZODone (DESYREL) tablet 100 mg  100 mg Oral QHS Ursula Alert, MD   100 mg at 01/14/15 2134    Lab Results:  Results for orders placed or performed during the hospital encounter of 01/10/15 (from the past 48 hour(s))  Glucose, capillary     Status: None   Collection Time: 01/13/15  4:51 PM  Result Value Ref Range   Glucose-Capillary 94 65 - 99 mg/dL   Comment 1 Notify RN    Comment 2 Document in Chart   Glucose, capillary     Status: Abnormal   Collection Time: 01/13/15  8:33 PM  Result Value Ref Range   Glucose-Capillary 113 (H) 65 - 99 mg/dL  Glucose, capillary     Status: None   Collection Time: 01/14/15  6:25 AM  Result Value Ref Range   Glucose-Capillary 84 65 - 99 mg/dL  Glucose, capillary     Status: None   Collection Time: 01/14/15  4:54 PM  Result Value Ref Range   Glucose-Capillary 86 65 - 99 mg/dL  Glucose, capillary     Status: None   Collection Time: 01/14/15  9:40 PM  Result Value Ref Range   Glucose-Capillary 91 65 - 99 mg/dL  Glucose, capillary     Status: None   Collection Time: 01/15/15  5:44 AM  Result Value Ref Range   Glucose-Capillary 83 65 - 99 mg/dL  Valproic acid level     Status: None   Collection Time: 01/15/15  6:40 AM  Result Value Ref Range    Valproic Acid Lvl 77 50.0 - 100.0 ug/mL    Comment: Performed at [facility]  Lipid panel     Status: Abnormal   Collection Time: 01/15/15  6:40 AM  Result Value Ref Range  Cholesterol 172 0 - 200 mg/dL   Triglycerides 109 <150 mg/dL   HDL 39 (L) >40 mg/dL   Total CHOL/HDL Ratio 4.4 RATIO   VLDL 22 0 - 40 mg/dL   LDL Cholesterol 111 (H) 0 - 99 mg/dL    Comment:        Total Cholesterol/HDL:CHD Risk Coronary Heart Disease Risk Table                     Men   Women  1/2 Average Risk   3.4   3.3  Average Risk       5.0   4.4  2 X Average Risk   9.6   7.1  3 X Average Risk  23.4   11.0        Use the calculated Patient Ratio above and the CHD Risk Table to determine the patient's CHD Risk.        ATP III CLASSIFICATION (LDL):  <100     mg/dL   Optimal  100-129  mg/dL   Near or Above                    Optimal  130-159  mg/dL   Borderline  160-189  mg/dL   High  >190     mg/dL   Very High Performed at [facility]     Physical Findings: AIMS: Facial and Oral Movements Muscles of Facial Expression: None, normal Lips and Perioral Area: None, normal Jaw: None, normal Tongue: None, normal,Extremity Movements Upper (arms, wrists, hands, fingers): None, normal Lower (legs, knees, ankles, toes): None, normal, Trunk Movements Neck, shoulders, hips: None, normal, Overall Severity Severity of abnormal movements (highest score from questions above): None, normal Incapacitation due to abnormal movements: None, normal [name] awareness of abnormal movements (rate only patient's report): No Awareness,    CIWA:    COWS:     Assessment: Patient is a 44 year old AAF who presented with psychosis, mood lability, HI towards family members, pt with past hx of schizoaffective do . Pt today with improvement of her depression as well as irritability, continues to be tangential at times, but is more organized today than yesterday . Pt will continue to benefit from  treatment.   Treatment Plan Summary: Daily contact with patient to assess and evaluate symptoms and progress in treatment and Medication management Will continue Abilify 25 mg po qhs. AIMS - 0 (01/15/15) Continue Cogentin 0.5 mg po qhs  for EPS. Will continue Depakote 1000 mg po qhs for mood sx. Depakote level reviewed - 01/15/15 - 77ug/ml. Plan to add a second mood stabilizer , if she continues to be irritable.However today seems to be improved since yesterday. Would consider adding Lithium along with Depakote - after discussion with patient if mood worsens. Will continue Vistaril 25 mg po tid prn for anxiety sx. Will continue Trazodone  100 mg po qhs for sleep. Will continue to monitor vitals ,medication compliance and treatment side effects while patient is here.  Will monitor for medical issues as well as call consult as needed.  CSW will start working on disposition. Patient to be referred to [facility] .PPD read on 01/13/15- negative Patient to participate in therapeutic milieu .   Medical Decision Making:  Review of Psycho-Social Stressors (1), Review of Last Therapy Session (1), Review of Medication Regimen & Side Effects (2) and Review of New Medication or Change in Dosage (2)     Jaryd Drew MD 01/15/2015, 12:52 PM

## 2015-01-15 NOTE — Tx Team (Signed)
Interdisciplinary Treatment Plan Update (Adult)  Date:  01/15/2015   Time Reviewed:  12:24 PM   Progress in Treatment: Attending groups: Yes. Participating in groups:  Yes. Taking medication as prescribed:  Yes. Tolerating medication:  Yes. Family/Significant othe contact made:  Yes Patient understands diagnosis:  Yes   Discussing patient identified problems/goals with staff:  Yes, see initial care plan. Medical problems stabilized or resolved:  Yes. Denies suicidal/homicidal ideation: Yes. Issues/concerns per patient self-inventory:  No. Other:  New problem(s) identified:  Discharge Plan or Barriers:  Continue to try to secure [facility], ALF  Reason for Continuation of Hospitalization: Medication stabilization Other; describe Flight of ideas, tangential thought  Comments:  [name] continues to be tangential  with flight of ideas. She is also reluctant to take antipsychotic, c/o it causing a "bleeding heart" along with side effects of sedation during daytime  - hence discussed changing her abilify dose to bedtime , she agrees with plan.    Estimated length of stay:  4-5 days  New goal(s):  Review of initial/current patient goals per problem list:     Attendees: Patient:  01/15/2015 12:24 PM   Family:   01/15/2015 12:24 PM   Physician:  Ursula Alert, MD 01/15/2015 12:24 PM   Nursing:   Gaylan Gerold, RN 01/15/2015 12:24 PM   CSW:    Roque Lias, [place]   01/15/2015 12:24 PM   Other:  01/15/2015 12:24 PM   Other:   01/15/2015 12:24 PM   Other:  Lars Pinks, Nurse CM 01/15/2015 12:24 PM   Other:  Lucinda Dell, Beverly Sessions TCT 01/15/2015 12:24 PM   Other:  Norberto Sorenson, [place]  01/15/2015 12:24 PM   Other:  01/15/2015 12:24 PM   Other:  01/15/2015 12:24 PM   Other:  01/15/2015 12:24 PM   Other:  01/15/2015 12:24 PM   Other:  01/15/2015 12:24 PM   Other:   01/15/2015 12:24 PM    Scribe for Treatment Team:   Trish Mage, 01/15/2015 12:24 PM

## 2015-01-15 NOTE — BHH Group Notes (Signed)
[facility] LCSW Aftercare Discharge Planning Group Note   01/15/2015 12:28 PM  Participation Quality:  Engaged  Mood/Affect:  Excited  Depression Rating:  denies  Anxiety Rating:  denies  Thoughts of Suicide:  No Will you contract for safety?   NA  Current AVH:  No  Plan for Discharge/Comments:  "I don't play fight."  Her response when asked if she is still interested in GH/ALF.  Lots of repetition when talking about her interview with the [facility], and also very tangential, difficult to follow.    Transportation Means:   Supports:  Roque Lias B

## 2015-01-15 NOTE — BHH Group Notes (Signed)
[place] LCSW Group Therapy  01/15/2015  1:05 PM  Type of Therapy:  Group therapy  Participation Level:  Active  Participation Quality:  Attentive  Affect:  Flat  Cognitive:  Oriented  Insight:  Limited  Engagement in Therapy:  Limited  Modes of Intervention:  Discussion, Socialization  Summary of Progress/Problems:  Chaplain was here to lead a group on themes of hope and courage.  "A true community is peaceful.  There's not a lot of hoopla."  Went on, following spurs, dead ends and switchbacks, as well as the usual rhyming.  "Fiesta siesta"    Trish Mage 01/15/2015 12:30 PM

## 2015-01-16 DIAGNOSIS — F250 Schizoaffective disorder, bipolar type: Principal | ICD-10-CM

## 2015-01-16 LAB — GLUCOSE, CAPILLARY
Glucose-Capillary: 100 mg/dL — ABNORMAL HIGH (ref 65–99)
Glucose-Capillary: 125 mg/dL — ABNORMAL HIGH (ref 65–99)

## 2015-01-16 LAB — HEMOGLOBIN A1C
Hgb A1c MFr Bld: 5.9 % — ABNORMAL HIGH (ref 4.8–5.6)
MEAN PLASMA GLUCOSE: 123 mg/dL

## 2015-01-16 MED ORDER — ARIPIPRAZOLE 15 MG PO TABS
30.0000 mg | ORAL_TABLET | Freq: Every day | ORAL | Status: DC
  Administered 2015-01-16 – 2015-01-19 (×4): 30 mg via ORAL
  Filled 2015-01-16 (×3): qty 2
  Filled 2015-01-16: qty 6
  Filled 2015-01-16 (×2): qty 2

## 2015-01-16 MED ORDER — LITHIUM CARBONATE ER 300 MG PO TBCR
300.0000 mg | EXTENDED_RELEASE_TABLET | Freq: Every day | ORAL | Status: DC
  Administered 2015-01-16 – 2015-01-20 (×6): 300 mg via ORAL
  Filled 2015-01-16: qty 3
  Filled 2015-01-16 (×6): qty 1

## 2015-01-16 NOTE — BHH Group Notes (Signed)
[place] Group Notes:  (Clinical Social Work)  01/16/2015  11:15-12:00PM  Summary of Progress/Problems:   The main focus of today's process group was to discuss patients' feelings related to being hospitalized, as well as the difference between "being" and "having" a mental health diagnosis.  It was agreed in general by the group that it would be preferable to avoid future hospitalizations, and we discussed means of doing that.  As a follow-up, problems with adhering to medication recommendations were discussed.  The patient expressed their primary feeling about being hospitalized is "quiet and peace" although she said when she goes to a hospital, from the moment she walks in she feels "rated and judged" although she acknowledged that is part of her disorder.  She insisted that CSW call her by 4 first names, but did not object when CSW just used "[place]."  She often spoke tangentially in reaction to other patients' statements.  Type of Therapy:  Group Therapy - Process  Participation Level:  Active  Participation Quality:  Attentive, Intrusive, Redirectable and Sharing  Affect:  Blunted  Cognitive:  Disorganized  Insight:  Developing/Improving  Engagement in Therapy:  Developing/Improving  Modes of Intervention:  Exploration, Discussion  Selmer Dominion, LCSW 01/16/2015, 12:44 PM

## 2015-01-16 NOTE — Progress Notes (Signed)
Did not attend group 

## 2015-01-16 NOTE — Progress Notes (Signed)
[facility] MD Progress Note  01/16/2015 1:30 PM [name]  MRN:  [id number] Subjective: I cannot live with my mother or my uncle.  I hate him.  I would do something if I go back there.     Objective: Patient seen chart reviewed.  Patient remains very irritable and angry when she is talking about her uncle and her mother.  Patient admitted having homicidal thoughts and plan towards his uncle.  Her thought processes remains very tangential and inappropriate.  She reported that her uncle cause too much stress in her life.  She continues to believe that she is bleeding from heart and no one sees it.  She remains very tangential, disorganized, irritable and delusional.  She left the group because she was upset.  She is taking her medication.  She told that she has taken lithium in the past with good response but she did not like it because she was getting blood work very frequently.  She preferred to live in a group home brought than her uncle because she is not sure if she would do to him.  Her sleep is okay.  She has no tremors or shakes.    Principal Problem: Schizoaffective disorder, bipolar type Diagnosis:   Patient Active Problem List   Diagnosis Date Noted  . Schizoaffective disorder, bipolar type [F25.0] 01/11/2015      Total Time spent with patient: 30 minutes   Past Medical History:  Past Medical History  Diagnosis Date  . Anemia   . Asthma   . Hypertension   . Schizophrenic disorder   . Seizures   . Anxiety   . Depression   . Insomnia, persistent   . Bipolar 1 disorder     Past Surgical History  Procedure Laterality Date  . Tonsillectomy     Family History:  Family History  Problem Relation Age of Onset  . Depression Mother   . Gout Mother    Social History:  History  Alcohol Use No     History  Drug Use No    History   Social History  . Marital Status: Single    Spouse Name: N/A  . Number of Children: N/A  . Years of Education: N/A   Social History Main  Topics  . Smoking status: Former Research scientist (life sciences)  . Smokeless tobacco: Never Used  . Alcohol Use: No  . Drug Use: No  . Sexual Activity: No   Other Topics Concern  . None   Social History Narrative   Additional History:    Sleep: Fair  Appetite:  Fair     Musculoskeletal: Strength & Muscle Tone: within normal limits Gait & Station: normal Patient leans: N/A   Psychiatric Specialty Exam: Physical Exam  Review of Systems  Constitutional: Negative.   Skin: Negative for itching and rash.  Psychiatric/Behavioral: Positive for hallucinations. The patient has insomnia.        Homicidal ideation towards uncle    Blood pressure 120/91, pulse 100, temperature 98.2 F (36.8 C), temperature source Oral, resp. rate 18, height 6' 0.5" (1.842 m), weight 86.183 kg (190 lb), SpO2 100 %.Body mass index is 25.4 kg/(m^2).  General Appearance: Fairly Groomed  Engineer, water::  Fair  Speech:  Pressured  Volume:  Normal  Mood:  Anxious, Depressed and Irritable   Affect:  Congruent  Thought Process:  Disorganized and Tangential   Orientation:  Full (Time, Place, and Person)  Thought Content:  Paranoid Ideation and Rumination  Suicidal Thoughts:  No  Homicidal Thoughts:  No  Memory:  Immediate;   Fair Recent;   Fair Remote;   Fair  Judgement:  Impaired  Insight:  Shallow  Psychomotor Activity:  Normal  Concentration:  Fair  Recall:  [employer] of Knowledge:Fair  Language: Fair  Akathisia:  No  Handed:  Right  AIMS (if indicated):     Assets:  Communication Skills Desire for Improvement Physical Health  ADL's:  Intact  Cognition: WNL  Sleep:  Number of Hours: 4.25     Current Medications: Current Facility-Administered Medications  Medication Dose Route Frequency Provider Last Rate Last Dose  . acetaminophen (TYLENOL) tablet 650 mg  650 mg Oral Q6H PRN Harriet Butte, NP      . alum & mag hydroxide-simeth (MAALOX/MYLANTA) 200-200-20 MG/5ML suspension 30 mL  30 mL Oral Q4H PRN  Harriet Butte, NP      . ARIPiprazole (ABILIFY) tablet 25 mg  25 mg Oral QHS Ursula Alert, MD   25 mg at 01/15/15 2203  . benztropine (COGENTIN) tablet 0.5 mg  0.5 mg Oral QHS Ursula Alert, MD   0.5 mg at 01/15/15 2203  . budesonide-formoterol (SYMBICORT) 160-4.5 MCG/ACT inhaler 2 puff  2 puff Inhalation BID Harriet Butte, NP   2 puff at 01/16/15 1206  . divalproex (DEPAKOTE ER) 24 hr tablet 1,000 mg  1,000 mg Oral QHS Ursula Alert, MD   1,000 mg at 01/15/15 2202  . hydrOXYzine (ATARAX/VISTARIL) tablet 25 mg  25 mg Oral TID PRN Harriet Butte, NP   25 mg at 01/10/15 2241  . magnesium hydroxide (MILK OF MAGNESIA) suspension 30 mL  30 mL Oral Daily PRN Harriet Butte, NP      . meloxicam (MOBIC) tablet 7.5 mg  7.5 mg Oral Daily Harriet Butte, NP   7.5 mg at 01/16/15 1206  . traZODone (DESYREL) tablet 100 mg  100 mg Oral QHS Ursula Alert, MD   100 mg at 01/15/15 2202    Lab Results:  Results for orders placed or performed during the hospital encounter of 01/10/15 (from the past 48 hour(s))  Glucose, capillary     Status: None   Collection Time: 01/14/15  4:54 PM  Result Value Ref Range   Glucose-Capillary 86 65 - 99 mg/dL  Glucose, capillary     Status: None   Collection Time: 01/14/15  9:40 PM  Result Value Ref Range   Glucose-Capillary 91 65 - 99 mg/dL  Glucose, capillary     Status: None   Collection Time: 01/15/15  5:44 AM  Result Value Ref Range   Glucose-Capillary 83 65 - 99 mg/dL  Valproic acid level     Status: None   Collection Time: 01/15/15  6:40 AM  Result Value Ref Range   Valproic Acid Lvl 77 50.0 - 100.0 ug/mL    Comment: Performed at [facility]  Lipid panel     Status: Abnormal   Collection Time: 01/15/15  6:40 AM  Result Value Ref Range   Cholesterol 172 0 - 200 mg/dL   Triglycerides 109 <150 mg/dL   HDL 39 (L) >40 mg/dL   Total CHOL/HDL Ratio 4.4 RATIO   VLDL 22 0 - 40 mg/dL   LDL Cholesterol 111 (H) 0 - 99 mg/dL    Comment:        Total  Cholesterol/HDL:CHD Risk Coronary Heart Disease Risk Table  Men   Women  1/2 Average Risk   3.4   3.3  Average Risk       5.0   4.4  2 X Average Risk   9.6   7.1  3 X Average Risk  23.4   11.0        Use the calculated Patient Ratio above and the CHD Risk Table to determine the patient's CHD Risk.        ATP III CLASSIFICATION (LDL):  <100     mg/dL   Optimal  100-129  mg/dL   Near or Above                    Optimal  130-159  mg/dL   Borderline  160-189  mg/dL   High  >190     mg/dL   Very High Performed at [facility]   Hemoglobin A1c     Status: Abnormal   Collection Time: 01/15/15  6:40 AM  Result Value Ref Range   Hgb A1c MFr Bld 5.9 (H) 4.8 - 5.6 %    Comment: (NOTE)         Pre-diabetes: 5.7 - 6.4         Diabetes: >6.4         Glycemic control for adults with diabetes: <7.0    Mean Plasma Glucose 123 mg/dL    Comment: (NOTE) Performed At: [facility] [place], [place] [id number] Lindon Romp MD WU:[id number] Performed at [facility]   Glucose, capillary     Status: None   Collection Time: 01/15/15  5:20 PM  Result Value Ref Range   Glucose-Capillary 76 65 - 99 mg/dL    Physical Findings: AIMS: Facial and Oral Movements Muscles of Facial Expression: None, normal Lips and Perioral Area: None, normal Jaw: None, normal Tongue: None, normal,Extremity Movements Upper (arms, wrists, hands, fingers): None, normal Lower (legs, knees, ankles, toes): None, normal, Trunk Movements Neck, shoulders, hips: None, normal, Overall Severity Severity of abnormal movements (highest score from questions above): None, normal Incapacitation due to abnormal movements: None, normal [name] awareness of abnormal movements (rate only patient's report): No Awareness,    CIWA:    COWS:     Assessment:  Patient remains psychotic, delusional, disorganized and irritable.  She requires inpatient psychiatric  treatment.    Treatment Plan Summary: Daily contact with patient to assess and evaluate symptoms and progress in treatment and Medication management Increase Abilify 30 mg daily ,  Continue Cogentin 0.5 mg po qhs  for EPS. Will continue Depakote 1000 mg po qhs for mood sx. Depakote level reviewed - 01/15/15 - 77ug/ml. We will add low-dose lithium to help her mood lability.  In the past she had a good response with lithium.  He will start lithium 300 mg daily and closely monitor efficacy, side effects .  Encouraged to participate in group milieu therapy.  Continue Vistaril 25 mg po tid prn for anxiety sx. Will continue Trazodone  100 mg po qhs for sleep. Will continue to monitor vitals ,medication compliance and treatment side effects while patient is here.  Will monitor for medical issues as well as call consult as needed.  CSW will start working on disposition. Patient to be referred to [facility] .PPD read on 01/13/15- negative Patient to participate in therapeutic milieu .   Medical Decision Making:  Review of Psycho-Social Stressors (1), Review of Last Therapy Session (1), Review of Medication Regimen & Side  Effects (2) and Review of New Medication or Change in Dosage (2)     Clotilde Loth T. MD 01/16/2015, 1:30 PM

## 2015-01-16 NOTE — Progress Notes (Signed)
[facility] Group Notes:  (Nursing/MHT/Case Management/Adjunct)  Date:  01/16/2015  Time:  12:20 AM  Type of Therapy:  Group Therapy  Participation Level:  Minimal  Participation Quality:  Appropriate  Affect:  Blunted  Cognitive:  Alert and Lacking  Insight:  Improving  Engagement in Group:  Developing/Improving  Modes of Intervention:  Socialization and Support  Summary of Progress/Problems: Pt. Was engaged in group discussion and had limited insight.  Lanell Persons 01/16/2015, 12:20 AM

## 2015-01-16 NOTE — Progress Notes (Signed)
D: Pt was less interactive today than previous day. Pt avoided Probation officer today, however in order to spark Education officer, museum asked pt about the conversation she had with her dr, pt stated, "I don't know, I didn't talk to him. Pt walked away as she spoke to Careers information officer. Pt voiced no other questions or concers.  A:  Support and encouragement was offered. 15 min checks continued for safety.  R: Pt remains safe.

## 2015-01-16 NOTE — BHH Group Notes (Signed)
[facility] Group Notes:  (Nursing/MHT/Case Management/Adjunct)  Date:  01/16/2015  Time:  11:36 AM  Type of Therapy:  Psychoeducational Skills  Participation Level:  Active  Participation Quality:  Appropriate  Affect:  Appropriate  Cognitive:  Appropriate  Insight:  Appropriate  Engagement in Group:  Engaged  Modes of Intervention:  Discussion  Summary of Progress/Problems: Pt did attend self inventory group, pt reported that she was negative SI/HI, and no  AH/VH noted. Pt rated her depression as a 10, and her helplessness/hopelessness as a 10.     Pt reported no issues or concerns.    [name] 01/16/2015, 11:36 AM

## 2015-01-16 NOTE — Progress Notes (Signed)
D Captola cont to be intrusive, sometimes inappropriate and generally in a labile mood. She can look straight at you , give you a t by [employer]

## 2015-01-17 LAB — GLUCOSE, CAPILLARY
GLUCOSE-CAPILLARY: 87 mg/dL (ref 65–99)
Glucose-Capillary: 89 mg/dL (ref 65–99)

## 2015-01-17 NOTE — Progress Notes (Signed)
Did not attend group 

## 2015-01-17 NOTE — Progress Notes (Signed)
[facility] MD Progress Note  01/17/2015 10:19 AM [name]  MRN:  [id number] Subjective: I like lithium.  I'm taking my medication.   Objective: Patient seen chart reviewed.  Patient was started on lithium yesterday and also increased her Abilify.  She is tolerating medication without any side effects.  She reported improvement in her sleep however she remains grandiose, labile and easily irritable.  Her thought processes remains tangential.  She is going to the groups but she has limited participation.  She is still have homicidal thoughts towards her uncle and she does not want to live with him.  She has grandiosity and she believe that she can act as Charma Igo and other celebrities.  She continued to endorse bleeding from his heart and her organs that no one can see.  Patient remains very disorganized delusional and easily irritable.  However she denies any tremors or shakes.  Her last Depakote level is 77.  Principal Problem: Schizoaffective disorder, bipolar type Diagnosis:   Patient Active Problem List   Diagnosis Date Noted  . Schizoaffective disorder, bipolar type [F25.0] 01/11/2015      Total Time spent with patient: 30 minutes   Past Medical History:  Past Medical History  Diagnosis Date  . Anemia   . Asthma   . Hypertension   . Schizophrenic disorder   . Seizures   . Anxiety   . Depression   . Insomnia, persistent   . Bipolar 1 disorder     Past Surgical History  Procedure Laterality Date  . Tonsillectomy     Family History:  Family History  Problem Relation Age of Onset  . Depression Mother   . Gout Mother    Social History:  History  Alcohol Use No     History  Drug Use No    History   Social History  . Marital Status: Single    Spouse Name: N/A  . Number of Children: N/A  . Years of Education: N/A   Social History Main Topics  . Smoking status: Former Research scientist (life sciences)  . Smokeless tobacco: Never Used  . Alcohol Use: No  . Drug Use: No  . Sexual  Activity: No   Other Topics Concern  . None   Social History Narrative   Additional History:    Sleep: Fair  Appetite:  Fair     Musculoskeletal: Strength & Muscle Tone: within normal limits Gait & Station: normal Patient leans: N/A   Psychiatric Specialty Exam: Physical Exam  Review of Systems  Cardiovascular: Negative for chest pain and palpitations.  Skin: Negative for itching and rash.  Neurological: Negative for dizziness and tremors.  Psychiatric/Behavioral: The patient is nervous/anxious.     Blood pressure 148/92, pulse 92, temperature 97.5 F (36.4 C), temperature source Oral, resp. rate 18, height 6' 0.5" (1.842 m), weight 86.183 kg (190 lb), SpO2 100 %.Body mass index is 25.4 kg/(m^2).  General Appearance: Fairly Groomed  Engineer, water::  Fair  Speech:  Pressured  Volume:  Normal  Mood:  Anxious and Irritable   Affect:  Congruent  Thought Process:  Disorganized and Tangential   Orientation:  Full (Time, Place, and Person)  Thought Content:  Paranoid Ideation and Rumination  Suicidal Thoughts:  No  Homicidal Thoughts:  Yes.  with intent/plan wants to kill her uncle   Memory:  Immediate;   Fair Recent;   Fair Remote;   Fair  Judgement:  Impaired  Insight:  Shallow  Psychomotor Activity:  Normal  Concentration:  Fair  Recall:  Smiley Houseman of Knowledge:Fair  Language: Fair  Akathisia:  No  Handed:  Right  AIMS (if indicated):     Assets:  Communication Skills Desire for Improvement Physical Health  ADL's:  Intact  Cognition: WNL  Sleep:  Number of Hours: 4.25     Current Medications: Current Facility-Administered Medications  Medication Dose Route Frequency Provider Last Rate Last Dose  . acetaminophen (TYLENOL) tablet 650 mg  650 mg Oral Q6H PRN Harriet Butte, NP      . alum & mag hydroxide-simeth (MAALOX/MYLANTA) 200-200-20 MG/5ML suspension 30 mL  30 mL Oral Q4H PRN Harriet Butte, NP      . ARIPiprazole (ABILIFY) tablet 30 mg  30 mg  Oral QHS Kathlee Nations, MD   30 mg at 01/16/15 2113  . benztropine (COGENTIN) tablet 0.5 mg  0.5 mg Oral QHS Ursula Alert, MD   0.5 mg at 01/16/15 2113  . budesonide-formoterol (SYMBICORT) 160-4.5 MCG/ACT inhaler 2 puff  2 puff Inhalation BID Harriet Butte, NP   2 puff at 01/17/15 0815  . divalproex (DEPAKOTE ER) 24 hr tablet 1,000 mg  1,000 mg Oral QHS Saramma Eappen, MD   1,000 mg at 01/16/15 2113  . hydrOXYzine (ATARAX/VISTARIL) tablet 25 mg  25 mg Oral TID PRN Harriet Butte, NP   25 mg at 01/10/15 2241  . lithium carbonate (LITHOBID) CR tablet 300 mg  300 mg Oral Daily Kathlee Nations, MD   300 mg at 01/17/15 0815  . magnesium hydroxide (MILK OF MAGNESIA) suspension 30 mL  30 mL Oral Daily PRN Harriet Butte, NP      . meloxicam (MOBIC) tablet 7.5 mg  7.5 mg Oral Daily Harriet Butte, NP   7.5 mg at 01/17/15 0815  . traZODone (DESYREL) tablet 100 mg  100 mg Oral QHS Ursula Alert, MD   100 mg at 01/16/15 2113    Lab Results:  Results for orders placed or performed during the hospital encounter of 01/10/15 (from the past 48 hour(s))  Glucose, capillary     Status: None   Collection Time: 01/15/15  5:20 PM  Result Value Ref Range   Glucose-Capillary 76 65 - 99 mg/dL  Glucose, capillary     Status: Abnormal   Collection Time: 01/16/15  5:16 PM  Result Value Ref Range   Glucose-Capillary 100 (H) 65 - 99 mg/dL  Glucose, capillary     Status: Abnormal   Collection Time: 01/16/15  9:31 PM  Result Value Ref Range   Glucose-Capillary 125 (H) 65 - 99 mg/dL  Glucose, capillary     Status: None   Collection Time: 01/17/15  5:57 AM  Result Value Ref Range   Glucose-Capillary 87 65 - 99 mg/dL   Comment 1 Notify RN     Physical Findings: AIMS: Facial and Oral Movements Muscles of Facial Expression: None, normal Lips and Perioral Area: None, normal Jaw: None, normal Tongue: None, normal,Extremity Movements Upper (arms, wrists, hands, fingers): None, normal Lower (legs, knees,  ankles, toes): None, normal, Trunk Movements Neck, shoulders, hips: None, normal, Overall Severity Severity of abnormal movements (highest score from questions above): None, normal Incapacitation due to abnormal movements: None, normal [name] awareness of abnormal movements (rate only patient's report): No Awareness,    CIWA:    COWS:     Assessment:  Patient remains psychotic, delusional, disorganized and irritable.  She requires inpatient psychiatric treatment.    Treatment Plan Summary: Daily contact with patient  to assess and evaluate symptoms and progress in treatment and Medication management Continue Abilify 30 mg daily.    Continue Cogentin 0.5 mg po qhs  for EPS. Will continue Depakote 1000 mg po qhs for mood sx. Depakote level reviewed - 01/15/15 - 77ug/ml. We started lithium yesterday and she seen to be some more calm and denies any side effects.  We will continue low-dose lithium.  Encouraged to participate in group milieu therapy.  Continue Vistaril 25 mg po tid prn for anxiety sx. Will continue Trazodone  100 mg po qhs for sleep. Will continue to monitor vitals ,medication compliance and treatment side effects while patient is here.  Closely monitor for medical issues as well as call consult as needed.  CSW will start working on disposition. Patient to participate in therapeutic milieu .   Medical Decision Making:  Review of Psycho-Social Stressors (1), Review of Last Therapy Session (1), Review of Medication Regimen & Side Effects (2) and Review of New Medication or Change in Dosage (2)     Korin Setzler T. MD 01/17/2015, 10:19 AM

## 2015-01-17 NOTE — BHH Group Notes (Signed)
[facility] Group Notes:  (Nursing/MHT/Case Management/Adjunct)  Date:  01/17/2015  Time:  11:23 AM  Type of Therapy:  Psychoeducational Skills  Participation Level:  Active  Participation Quality:  Appropriate  Affect:  Appropriate  Cognitive:  Appropriate  Insight:  Appropriate  Engagement in Group:  Engaged  Modes of Intervention:  Discussion  Summary of Progress/Problems: Pt did attend self inventory group, pt reported that she was negative SI/HI, no AH/VH noted. Pt rated her depression as a 10, and her helplessness/hopelessness as a 10.     Pt reported no issues or concerns.   [name] 01/17/2015, 11:23 AM

## 2015-01-17 NOTE — Progress Notes (Signed)
D. Pt had been up and visible this evening, spoke about how she is not doing good and spoke about how she has been having family issues, but jumps around to different topics when engaged in conversation. Pt did receive medications without incident and did not verbalize any complaints. A. Support and encouragement provided. R. Safety maintained, will continue to monitor.

## 2015-01-17 NOTE — BHH Group Notes (Signed)
[facility] Group Notes:  (Clinical Social Work)  01/17/2015  [facility] Group Notes:  (Clinical Social Work)  01/17/2015  11:00AM-12:00PM  Summary of Progress/Problems:  The main focus of today's process group was to listen to a variety of genres of music and to identify that different types of music provoke different responses.  The patient then was able to identify personally what was soothing for them, as well as energizing.  Handouts were used to record feelings evoked, as well as how patient can personally use this knowledge in sleep habits, with depression, and with other symptoms.  The patient expressed understanding of concepts, as well as knowledge of how each type of music affected him/her and how this can be used at home as a wellness/recovery tool.  She spoke after each song in metaphorical phrases which were bizarre.  Type of Therapy:  Music Therapy   Participation Level:  Active  Participation Quality:  Attentive and Sharing  Affect:  Blunted  Cognitive:  Confused  Insight:  Limited  Engagement in Therapy:  Engaged  Modes of Intervention:   Activity, Exploration  Selmer Dominion, LCSW 01/17/2015

## 2015-01-17 NOTE — Progress Notes (Signed)
[name] remains irritable, beligerent and disorganized. She can be calm and watching TV one minute and then the next she is yelling at another patient to " gt out of my way". A She takes her meds as scheduled. She refused her cbg to be checked this evening at dinner. She deneis SI and says to staff...." what do ya think I am .I don't want to KILL myself" R POC in place.

## 2015-01-18 LAB — GLUCOSE, CAPILLARY
Glucose-Capillary: 86 mg/dL (ref 65–99)
Glucose-Capillary: 116 mg/dL — ABNORMAL HIGH (ref 65–99)

## 2015-01-18 MED ORDER — ARIPIPRAZOLE ER 400 MG IM SUSR
400.0000 mg | INTRAMUSCULAR | Status: DC
  Administered 2015-01-18: 400 mg via INTRAMUSCULAR

## 2015-01-18 NOTE — BHH Group Notes (Signed)
[facility] LCSW Aftercare Discharge Planning Group Note   01/18/2015 10:41 AM  Participation Quality:  Engaged  Mood/Affect:  Excited  Depression Rating:    Anxiety Rating:    Thoughts of Suicide:  No Will you contract for safety?   NA  Current AVH:  Denies  Plan for Discharge/Comments:  [name] talked about her phone calls this weekend, and when told to call Amma today, talked about were the group home is and how it is close to the church she used to attend.  She sang second soprano in the choir "but no one could here me cause the director kept telling me to tone it down."  Still tangential, but seems less disorganized today that last Friday.  Had a lot of advice for another patient who is trying to find a PCP.  Transportation Means:   Supports:  Roque Lias B

## 2015-01-18 NOTE — Progress Notes (Signed)
Patient ID: [name], female   DOB: 07-Oct-1970, 44 y.o.   MRN: [id number]  DAR: Pt. Denies SI/HI and A/V Hallucinations. Patient reports sleep last night was poor, appetite is fair, energy level is low, and concentration level is poor. Patient rates her depression as 10/10 and reports this as being the average for her. She rates her depression 10/10 and her hopelessness at 5/10. Patient does not report any pain or discomfort at this time. Support and encouragement provided to the patient. Scheduled medications administered to patient per physician's orders. Patient received her Abilify maintenance injection today and reported no issues upon administration. Patient is receptive and cooperative. Patient continues to have labile mood but has been more pleasant than prior days this writer has assumed care for her. Patient is seen in the milieu at times and is attending some groups. Q15 minute checks are maintained for safety.

## 2015-01-18 NOTE — Progress Notes (Signed)
Patient ID: [name], female   DOB: 1971-03-16, 44 y.o.   MRN: [id number] PER STATE REGULATIONS 482.30  THIS CHART WAS REVIEWED FOR MEDICAL NECESSITY WITH RESPECT TO THE [name] ADMISSION/ DURATION OF STAY.  NEXT REVIEW DATE: 01/23/2015  Chauncy Lean, RN, BSN CASE MANAGER

## 2015-01-18 NOTE — Progress Notes (Signed)
D. Pt had been up and visible in milieu this evening, did not attend evening group activity however. Pt thought process is tangential and pt is easily agitated and was seen becoming rude with another pt and did need re-direction from staff. Pt did receive medications without incident and did speak again about on-going family issues. A. Support and encouragement provided. R. Safety maintained, will continue to monitor.

## 2015-01-18 NOTE — Progress Notes (Signed)
[facility] MD Progress Note  01/18/2015 12:27 PM [name]  MRN:  [id number] Subjective:Patient states " I am better , Rod said I could go to this [facility], everything is being set up now . I am OK now , I still feel upset at times , I try to keep it down.'   Objective: Patient seen chart reviewed.[name] is a 44 year old AA female who presented to the [facility] after she was referred there by her family.Pt presented with HI towards her uncle.  I have reviewed previous notes in EHR -per Dr.Arfeen. Pt was started on a second mood stabilizer Lithium over the week end. Pt today appears to be less irritable , however continues to have periodic agitation and irritability,but is redirectable. Pt with improved sleep, improved appetite . Pt reports working on her coping skills to control her anger , states she is usually not violent , but had some "confusion at home ". Pt continues to be tangential , requiring redirection to help her to come back to topic . Discussed starting Abilify maintenna - LAI - since she is tolerating the Abilify PO well. Pt agrees with plan.  CSW will continue to work on [facility] placement. Pt agreeable with plan.       Principal Problem: Schizoaffective disorder, bipolar type Diagnosis:   Patient Active Problem List   Diagnosis Date Noted  . Schizoaffective disorder, bipolar type [F25.0] 01/11/2015      Total Time spent with patient: 30 minutes   Past Medical History:  Past Medical History  Diagnosis Date  . Anemia   . Asthma   . Hypertension   . Schizophrenic disorder   . Seizures   . Anxiety   . Depression   . Insomnia, persistent   . Bipolar 1 disorder     Past Surgical History  Procedure Laterality Date  . Tonsillectomy     Family History:  Family History  Problem Relation Age of Onset  . Depression Mother   . Gout Mother    Social History:  History  Alcohol Use No     History  Drug Use No    History   Social History  . Marital Status: Single    Spouse Name: N/A  . Number of Children: N/A  . Years of Education: N/A   Social History Main Topics  . Smoking status: Former Research scientist (life sciences)  . Smokeless tobacco: Never Used  . Alcohol Use: No  . Drug Use: No  . Sexual Activity: No   Other Topics Concern  . None   Social History Narrative   Additional History:    Sleep: Fair  Appetite:  Fair     Musculoskeletal: Strength & Muscle Tone: within normal limits Gait & Station: normal Patient leans: N/A   Psychiatric Specialty Exam: Physical Exam  Review of Systems  Psychiatric/Behavioral: Positive for depression. The patient is nervous/anxious.   All other systems reviewed and are negative.   Blood pressure 125/88, pulse 96, temperature 98.6 F (37 C), temperature source Oral, resp. rate 20, height 6' 0.5" (1.842 m), weight 86.183 kg (190 lb), SpO2 100 %.Body mass index is 25.4 kg/(m^2).  General Appearance: Fairly Groomed  Engineer, water::  Fair  Speech:  Pressured - improving  Volume:  Normal  Mood:  Anxious and Irritable improving  Affect:  Congruent  Thought Process:  Tangential redirectable  Orientation:  Full (Time, Place, and Person)  Thought Content:  Rumination  Suicidal Thoughts:  No  Homicidal Thoughts:  No did not  express  desire to kill her uncle today  Memory:  Immediate;   Fair Recent;   Fair Remote;   Fair  Judgement:  Fair  Insight:  Shallow  Psychomotor Activity:  Normal  Concentration:  Fair  Recall:  [employer] of Knowledge:Fair  Language: Fair  Akathisia:  No  Handed:  Right  AIMS (if indicated):     Assets:  Communication Skills Desire for Improvement Physical Health  ADL's:  Intact  Cognition: WNL  Sleep:  Number of Hours: 4.25     Current Medications: Current Facility-Administered Medications  Medication Dose Route Frequency Provider Last Rate Last Dose  . acetaminophen (TYLENOL) tablet 650 mg  650 mg Oral Q6H PRN Harriet Butte, NP      . alum & mag hydroxide-simeth  (MAALOX/MYLANTA) 200-200-20 MG/5ML suspension 30 mL  30 mL Oral Q4H PRN Harriet Butte, NP      . ARIPiprazole (ABILIFY) tablet 30 mg  30 mg Oral QHS Kathlee Nations, MD   30 mg at 01/17/15 2044  . ARIPiprazole SUSR 400 mg  400 mg Intramuscular Q28 days Ursula Alert, MD      . benztropine (COGENTIN) tablet 0.5 mg  0.5 mg Oral QHS Estalee Mccandlish, MD   0.5 mg at 01/17/15 2045  . budesonide-formoterol (SYMBICORT) 160-4.5 MCG/ACT inhaler 2 puff  2 puff Inhalation BID Harriet Butte, NP   2 puff at 01/18/15 0845  . divalproex (DEPAKOTE ER) 24 hr tablet 1,000 mg  1,000 mg Oral QHS Deveron Shamoon, MD   1,000 mg at 01/17/15 2045  . hydrOXYzine (ATARAX/VISTARIL) tablet 25 mg  25 mg Oral TID PRN Harriet Butte, NP   25 mg at 01/10/15 2241  . lithium carbonate (LITHOBID) CR tablet 300 mg  300 mg Oral Daily Kathlee Nations, MD   300 mg at 01/18/15 0845  . magnesium hydroxide (MILK OF MAGNESIA) suspension 30 mL  30 mL Oral Daily PRN Harriet Butte, NP      . meloxicam (MOBIC) tablet 7.5 mg  7.5 mg Oral Daily Harriet Butte, NP   7.5 mg at 01/18/15 0845  . traZODone (DESYREL) tablet 100 mg  100 mg Oral QHS Ursula Alert, MD   100 mg at 01/17/15 2045    Lab Results:  Results for orders placed or performed during the hospital encounter of 01/10/15 (from the past 48 hour(s))  Glucose, capillary     Status: Abnormal   Collection Time: 01/16/15  5:16 PM  Result Value Ref Range   Glucose-Capillary 100 (H) 65 - 99 mg/dL  Glucose, capillary     Status: Abnormal   Collection Time: 01/16/15  9:31 PM  Result Value Ref Range   Glucose-Capillary 125 (H) 65 - 99 mg/dL  Glucose, capillary     Status: None   Collection Time: 01/17/15  5:57 AM  Result Value Ref Range   Glucose-Capillary 87 65 - 99 mg/dL   Comment 1 Notify RN   Glucose, capillary     Status: None   Collection Time: 01/17/15  8:33 PM  Result Value Ref Range   Glucose-Capillary 89 65 - 99 mg/dL  Glucose, capillary     Status: None   Collection  Time: 01/18/15  6:01 AM  Result Value Ref Range   Glucose-Capillary 86 65 - 99 mg/dL    Physical Findings: AIMS: Facial and Oral Movements Muscles of Facial Expression: None, normal Lips and Perioral Area: None, normal Jaw: None, normal Tongue: None, normal,Extremity  Movements Upper (arms, wrists, hands, fingers): None, normal Lower (legs, knees, ankles, toes): None, normal, Trunk Movements Neck, shoulders, hips: None, normal, Overall Severity Severity of abnormal movements (highest score from questions above): None, normal Incapacitation due to abnormal movements: None, normal [name] awareness of abnormal movements (rate only patient's report): No Awareness,    CIWA:    COWS:     Assessment: Patient is a 44 year old AAF who presented with psychosis, mood lability, HI towards family members, pt with past hx of schizoaffective do . Pt has been progressively showing improvement on her currwent medication regimen, tolerating the abilify well. Discussed LAI Abilify maintenna - pt agrees with plan. Pt will continue to benefit from treatment.    Treatment Plan Summary: Daily contact with patient to assess and evaluate symptoms and progress in treatment and Medication management Continue Abilify 30 mg PO qhs. Abilify Maintena IM 400 mg q28 days - first dose today - 01/18/15.  AIMS - 0 (01/18/15) Continue Cogentin 0.5 mg po qhs  for EPS. Will continue Depakote 1000 mg po qhs for mood sx. Depakote level reviewed - 01/15/15 - 77ug/ml Lithium 300 mg po daily was started on 01/16/15 , inorder to augment the effect of Depakote . Pt has been tolerating it well. Li level on 01/20/15. Continue Vistaril 25 mg po tid prn for anxiety sx. Will continue Trazodone  100 mg po qhs for sleep. Will continue to monitor vitals ,medication compliance and treatment side effects while patient is here.  Closely monitor for medical issues as well as call consult as needed.  CSW will start working on disposition.  Patient to participate in therapeutic milieu .   Medical Decision Making:  Established Problem, Stable/Improving (1), Review of Psycho-Social Stressors (1), Review of Last Therapy Session (1), Review of Medication Regimen & Side Effects (2) and Review of New Medication or Change in Dosage (2)     Tykeem Lanzer MD 01/18/2015, 12:27 PM

## 2015-01-18 NOTE — BHH Group Notes (Signed)
[facility] LCSW Group Therapy  01/18/2015 1:15 pm  Type of Therapy: Process Group Therapy  Participation Level:  Active  Participation Quality:  Appropriate  Affect:  Flat  Cognitive:  Oriented  Insight:  Improving  Engagement in Group:  Limited  Engagement in Therapy:  Limited  Modes of Intervention:  Activity, Clarification, Education, Problem-solving and Support  Summary of Progress/Problems: Today's group addressed the issue of overcoming obstacles.  Patients were asked to identify their biggest obstacle post d/c that stands in the way of their on-going success, and then problem solve as to how to manage this.  "I have some trust issues with my family.  I think it would be good if I go into a family care home.  They took all the blood out of my body, and they still keep sticking me.  I think I am a going to become a zombie."  Continues pleasantly psychotic with a good deal of disorganization and flight of ideas.  Trish Mage 01/18/2015   3:35 PM

## 2015-01-19 LAB — GLUCOSE, CAPILLARY: Glucose-Capillary: 116 mg/dL — ABNORMAL HIGH (ref 65–99)

## 2015-01-19 MED ORDER — TRAZODONE HCL 150 MG PO TABS
150.0000 mg | ORAL_TABLET | Freq: Every day | ORAL | Status: DC
  Administered 2015-01-19: 150 mg via ORAL
  Filled 2015-01-19: qty 3
  Filled 2015-01-19 (×2): qty 1

## 2015-01-19 NOTE — Progress Notes (Signed)
D: Pt informed the writer that she will be going to [facility] after discharge. Stated she's hoping to "stay 6 yrs maybe 8 yrs. Hope i can hold on, then secure another place." Writer noticed that pt appeared to be rambling more today than previous assessment by the Probation officer.   A:  Support and encouragement was offered. 15 min checks continued for safety.  R: Pt remains safe.

## 2015-01-19 NOTE — Progress Notes (Signed)
D: When asked about her day pt stated, "I got thru all those diabetic sticks. I just don't have that much blood." Informed the writer that she spoke to her "Family Care home person. States she plans to stay there 6 to 8 yrs after that "she's gonna have to hustle".  States she wants to have her own apt.  Pt voiced no questions or concerns at this time.  A:  Support and encouragement was offered. 15 min checks continued for safety.  R: Pt remains safe.

## 2015-01-19 NOTE — BHH Group Notes (Signed)
[place] LCSW Group Therapy  01/19/2015 , 2:00 PM   Type of Therapy:  Group Therapy  Participation Level:  Active  Participation Quality:  Attentive  Affect:  Appropriate  Cognitive:  Alert  Insight:  Improving  Engagement in Therapy:  Engaged  Modes of Intervention:  Discussion, Exploration and Socialization  Summary of Progress/Problems: Today's group focused on the term Diagnosis.  Participants were asked to define the term, and then pronounce whether it is a negative, positive or neutral term.  Pleasantly psychotic.  Tangential, flight of ideas.  When asked about distractions took it as meaning negative distractions and started talking about predatory people who take advantage of others.  Was able to be redirected eventually, and talked about enjoying nature as a positive distraction.  [name] B 01/19/2015 , 2:00 PM

## 2015-01-19 NOTE — Progress Notes (Signed)
[facility] MD Progress Note  01/19/2015 1:46 PM [name]  MRN:  [id number] Subjective:Patient states " I slept only 15 minutes last night."    Objective: Patient seen chart reviewed.[name] is a 44 year old AA female who presented to the [facility] after she was referred there by her family.Pt presented with HI towards her uncle.  Pt today found in bed. Pt avoids eye contact, appears to be depressed. Pt reports sleep issues last night , that being the reason for her staying in bed late this AM. Pt today otherwise appeared to be calm. Denied any new concerns. Pt received her Abilify maintenna IM yesterday. Denies any ADRs of medications. Pt encouraged to take her medications. Discussed increasing her sleep medication tonight. Per staff, no disruptive issues noted on the unit.  CSW will continue to work on [facility] placement. Pt agreeable with plan.       Principal Problem: Schizoaffective disorder, bipolar type ,multiple episodes , currently in acute episode Diagnosis:   Patient Active Problem List   Diagnosis Date Noted  . Schizoaffective disorder, bipolar type [F25.0] 01/11/2015      Total Time spent with patient: 30 minutes   Past Medical History:  Past Medical History  Diagnosis Date  . Anemia   . Asthma   . Hypertension   . Schizophrenic disorder   . Seizures   . Anxiety   . Depression   . Insomnia, persistent   . Bipolar 1 disorder     Past Surgical History  Procedure Laterality Date  . Tonsillectomy     Family History:  Family History  Problem Relation Age of Onset  . Depression Mother   . Gout Mother    Social History:  History  Alcohol Use No     History  Drug Use No    History   Social History  . Marital Status: Single    Spouse Name: N/A  . Number of Children: N/A  . Years of Education: N/A   Social History Main Topics  . Smoking status: Former Research scientist (life sciences)  . Smokeless tobacco: Never Used  . Alcohol Use: No  . Drug Use: No  . Sexual Activity: No    Other Topics Concern  . None   Social History Narrative   Additional History:    Sleep: Poor  Appetite:  Fair     Musculoskeletal: Strength & Muscle Tone: within normal limits Gait & Station: normal Patient leans: N/A   Psychiatric Specialty Exam: Physical Exam  Review of Systems  Psychiatric/Behavioral: The patient has insomnia.   All other systems reviewed and are negative.   Blood pressure 143/95, pulse 100, temperature 97.5 F (36.4 C), temperature source Oral, resp. rate 16, height 6' 0.5" (1.842 m), weight 86.183 kg (190 lb), SpO2 100 %.Body mass index is 25.4 kg/(m^2).  General Appearance: Fairly Groomed  Engineer, water::  Fair  Speech:  Normal rate  Volume:  Normal  Mood:  Anxious and Irritable improving  Affect:  Congruent  Thought Process:  Linear   Orientation:  Full (Time, Place, and Person)  Thought Content:  Rumination  Suicidal Thoughts:  No  Homicidal Thoughts:  No   Memory:  Immediate;   Fair Recent;   Fair Remote;   Fair  Judgement:  Fair  Insight:  Shallow  Psychomotor Activity:  Normal  Concentration:  Fair  Recall:  [place]  Language: Fair  Akathisia:  No  Handed:  Right  AIMS (if indicated):  Assets:  Communication Skills Desire for Improvement Physical Health  ADL's:  Intact  Cognition: WNL  Sleep:  Number of Hours: 5.25     Current Medications: Current Facility-Administered Medications  Medication Dose Route Frequency Provider Last Rate Last Dose  . acetaminophen (TYLENOL) tablet 650 mg  650 mg Oral Q6H PRN Harriet Butte, NP      . alum & mag hydroxide-simeth (MAALOX/MYLANTA) 200-200-20 MG/5ML suspension 30 mL  30 mL Oral Q4H PRN Harriet Butte, NP      . ARIPiprazole (ABILIFY) tablet 30 mg  30 mg Oral QHS Kathlee Nations, MD   30 mg at 01/18/15 2246  . ARIPiprazole SUSR 400 mg  400 mg Intramuscular Q28 days Ursula Alert, MD   400 mg at 01/18/15 1535  . benztropine (COGENTIN) tablet 0.5 mg  0.5 mg  Oral QHS Ursula Alert, MD   0.5 mg at 01/18/15 2246  . budesonide-formoterol (SYMBICORT) 160-4.5 MCG/ACT inhaler 2 puff  2 puff Inhalation BID Harriet Butte, NP   2 puff at 01/19/15 0915  . divalproex (DEPAKOTE ER) 24 hr tablet 1,000 mg  1,000 mg Oral QHS Gladys Gutman, MD   1,000 mg at 01/18/15 2246  . hydrOXYzine (ATARAX/VISTARIL) tablet 25 mg  25 mg Oral TID PRN Harriet Butte, NP   25 mg at 01/10/15 2241  . lithium carbonate (LITHOBID) CR tablet 300 mg  300 mg Oral Daily Kathlee Nations, MD   300 mg at 01/19/15 0915  . magnesium hydroxide (MILK OF MAGNESIA) suspension 30 mL  30 mL Oral Daily PRN Harriet Butte, NP      . meloxicam (MOBIC) tablet 7.5 mg  7.5 mg Oral Daily Harriet Butte, NP   7.5 mg at 01/19/15 0915  . traZODone (DESYREL) tablet 150 mg  150 mg Oral QHS Ursula Alert, MD        Lab Results:  Results for orders placed or performed during the hospital encounter of 01/10/15 (from the past 48 hour(s))  Glucose, capillary     Status: None   Collection Time: 01/17/15  8:33 PM  Result Value Ref Range   Glucose-Capillary 89 65 - 99 mg/dL  Glucose, capillary     Status: None   Collection Time: 01/18/15  6:01 AM  Result Value Ref Range   Glucose-Capillary 86 65 - 99 mg/dL  Glucose, capillary     Status: Abnormal   Collection Time: 01/18/15  8:15 PM  Result Value Ref Range   Glucose-Capillary 116 (H) 65 - 99 mg/dL  Glucose, capillary     Status: Abnormal   Collection Time: 01/19/15  6:22 AM  Result Value Ref Range   Glucose-Capillary 116 (H) 65 - 99 mg/dL    Physical Findings: AIMS: Facial and Oral Movements Muscles of Facial Expression: None, normal Lips and Perioral Area: None, normal Jaw: None, normal Tongue: None, normal,Extremity Movements Upper (arms, wrists, hands, fingers): None, normal Lower (legs, knees, ankles, toes): None, normal, Trunk Movements Neck, shoulders, hips: None, normal, Overall Severity Severity of abnormal movements (highest score from  questions above): None, normal Incapacitation due to abnormal movements: None, normal [name] awareness of abnormal movements (rate only patient's report): No Awareness,    CIWA:    COWS:     Assessment: Patient is a 44 year old AAF who presented with psychosis, mood lability, HI towards family members, pt with past hx of schizoaffective do . Pt has been progressively showing improvement on her currwent medication regimen, tolerating the  abilify well. Pt started on Abilify Maintenna IM 400 mg yesterday. Pt with sleep issues last night, discussed readjusting her sleep medications to target this.  Pt will continue to benefit from treatment.    Treatment Plan Summary: Daily contact with patient to assess and evaluate symptoms and progress in treatment and Medication management Continue Abilify 30 mg PO qhs. Abilify Maintena IM 400 mg q28 days - first dose  - 01/18/15.  AIMS - 0 (01/18/15) Continue Cogentin 0.5 mg po qhs  for EPS. Will continue Depakote 1000 mg po qhs for mood sx. Depakote level reviewed - 01/15/15 - 77ug/ml Lithium 300 mg po daily was started on 01/16/15 , inorder to augment the effect of Depakote . Pt has been tolerating it well. Li level on 01/20/15. Continue Vistaril 25 mg po tid prn for anxiety sx. Will increase Trazodone to 150 mg po qhs for sleep. Will continue to monitor vitals ,medication compliance and treatment side effects while patient is here.  Closely monitor for medical issues as well as call consult as needed.  CSW will start working on disposition. Patient to participate in therapeutic milieu .   Medical Decision Making:  Established Problem, Stable/Improving (1), Review of Psycho-Social Stressors (1), Review of Last Therapy Session (1), Review of Medication Regimen & Side Effects (2) and Review of New Medication or Change in Dosage (2)     Jaysion Ramseyer MD 01/19/2015, 1:46 PM

## 2015-01-19 NOTE — Plan of Care (Signed)
Problem: Alteration in thought process Goal: LTG-Patient verbalizes understanding importance med regimen (Patient verbalizes understanding of importance of medication regimen and need to continue outpatient care.)  Outcome: Completed/Met Date Met:  01/19/15 Patient in group reports she needs to take her medications and says that she trusts the doctor to give her the right medications.

## 2015-01-19 NOTE — BHH Group Notes (Signed)
[place] Group Notes:  (Nursing/MHT/Case Management/Adjunct)  Date:  01/19/2015  Time:  10:08 AM  Type of Therapy:  Nurse Education  Participation Level:  Active  Participation Quality:  Intrusive, Redirectable and Sharing  Affect:  Blunted  Cognitive:  Alert and Appropriate  Insight:  Limited  Engagement in Group:  Engaged and Supportive  Modes of Intervention:  Discussion and Education  Summary of Progress/Problems: The purposed of this group is to discuss the topic of the day which is recovery. Daily booklets were given to patients and patients were to verbalized what recovery means to them. Patient attended group and stated recovery means, "getting back to peace." "Restoration," she said. Patient began to go off topic but was redirected.    Emilyanne Mcgough E 01/19/2015, 10:08 AM

## 2015-01-19 NOTE — Progress Notes (Signed)
Patient ID: [name], female   DOB: 1970-09-01, 44 y.o.   MRN: [id number]  DAR: Pt. Denies SI/HI and A/V Hallucinations to this Probation officer. Patient does not report any pain or discomfort at this time. Patient reports that sleep is good, appetite is good, energy level is normal, and concentration level is poor. Patient rates her depression and hopelessness at 2/10 and her anxiety level 0/10. Support and encouragement provided to the patient. Scheduled medications administered to patient per physician's orders. Patient is receptive and cooperative but minimal. Patient does seem to still to have some disorganized thoughts but is more pleasant. Patient is seen in the milieu interacting with others and is attending groups. Q15 minute checks are maintained for safety.

## 2015-01-20 ENCOUNTER — Encounter (HOSPITAL_COMMUNITY): Payer: Self-pay | Admitting: Registered Nurse

## 2015-01-20 LAB — GLUCOSE, CAPILLARY: Glucose-Capillary: 88 mg/dL (ref 65–99)

## 2015-01-20 LAB — LITHIUM LEVEL: LITHIUM LVL: 0.13 mmol/L — AB (ref 0.60–1.20)

## 2015-01-20 MED ORDER — MELOXICAM 7.5 MG PO TABS: 7.5000 mg | ORAL_TABLET | Freq: Every day | ORAL | Status: DC

## 2015-01-20 MED ORDER — BENZTROPINE MESYLATE 0.5 MG PO TABS: 0.5000 mg | ORAL_TABLET | Freq: Every day | ORAL | Status: DC

## 2015-01-20 MED ORDER — LITHIUM CARBONATE ER 300 MG PO TBCR: 300.0000 mg | EXTENDED_RELEASE_TABLET | Freq: Every day | ORAL | Status: DC

## 2015-01-20 MED ORDER — TRAZODONE HCL 150 MG PO TABS: 150.0000 mg | ORAL_TABLET | Freq: Every evening | ORAL | Status: DC | PRN

## 2015-01-20 MED ORDER — DIVALPROEX SODIUM ER 500 MG PO TB24: 1000.0000 mg | ORAL_TABLET | Freq: Every day | ORAL | Status: DC

## 2015-01-20 MED ORDER — ARIPIPRAZOLE ER 400 MG IM SUSR: 400.0000 mg | INTRAMUSCULAR | Status: DC

## 2015-01-20 MED ORDER — BUDESONIDE-FORMOTEROL FUMARATE 160-4.5 MCG/ACT IN AERO: 2.0000 | INHALATION_SPRAY | Freq: Two times a day (BID) | RESPIRATORY_TRACT | Status: DC

## 2015-01-20 MED ORDER — HYDROXYZINE HCL 25 MG PO TABS: 25.0000 mg | ORAL_TABLET | Freq: Three times a day (TID) | ORAL | Status: DC | PRN

## 2015-01-20 MED ORDER — ARIPIPRAZOLE 30 MG PO TABS: 30.0000 mg | ORAL_TABLET | Freq: Every day | ORAL | Status: DC

## 2015-01-20 NOTE — Tx Team (Signed)
Interdisciplinary Treatment Plan Update (Adult)  Date:  01/20/2015   Time Reviewed:  11:40 AM   Progress in Treatment: Attending groups: Yes. Participating in groups:  Yes. Taking medication as prescribed:  Yes. Tolerating medication:  Yes. Family/Significant othe contact made:   Patient understands diagnosis:  Yes  As evidenced by seeking help with Discussing patient identified problems/goals with staff:  Yes, see initial care plan. Medical problems stabilized or resolved:  Yes. Denies suicidal/homicidal ideation: Yes. Issues/concerns per patient self-inventory:  No. Other:  New problem(s) identified:  Discharge Plan or Barriers:  [facility], follow up [facility]  Reason for Continuation of Hospitalization:   Comments:  Estimated length of stay: D/C today  New goal(s):  Review of initial/current patient goals per problem list:     Attendees: Patient:  01/20/2015 11:40 AM   Family:   01/20/2015 11:40 AM   Physician:  Ursula Alert, MD 01/20/2015 11:40 AM   Nursing:   Gaylan Gerold, RN 01/20/2015 11:40 AM   CSW:    Roque Lias, LCSW   01/20/2015 11:40 AM   Other:  01/20/2015 11:40 AM   Other:   01/20/2015 11:40 AM   Other:  Lars Pinks, Nurse CM 01/20/2015 11:40 AM   Other:  Lucinda Dell, [facility] TCT 01/20/2015 11:40 AM   Other:  Norberto Sorenson, [place]  01/20/2015 11:40 AM   Other:  01/20/2015 11:40 AM   Other:  01/20/2015 11:40 AM   Other:  01/20/2015 11:40 AM   Other:  01/20/2015 11:40 AM   Other:  01/20/2015 11:40 AM   Other:   01/20/2015 11:40 AM    Scribe for Treatment Team:   Trish Mage, 01/20/2015 11:40 AM

## 2015-01-20 NOTE — BHH Suicide Risk Assessment (Signed)
[place] INPATIENT:  Family/Significant Other Suicide Prevention Education  Suicide Prevention Education:  Patient Discharged to [place]:  Suicide Prevention Education Not Provided: [place], [place] The patient is discharging to another healthcare facility for continuation of treatment.  The patient's medical information, including suicide ideations and risk factors, are a part of the medical information shared with the receiving healthcare facility.  Trish Mage 01/20/2015, 8:43 AM

## 2015-01-20 NOTE — BHH Suicide Risk Assessment (Signed)
[facility] Discharge Suicide Risk Assessment   Demographic Factors:  NA  Total Time spent with patient: 30 minutes  Musculoskeletal: Strength & Muscle Tone: within normal limits Gait & Station: normal Patient leans: N/A  Psychiatric Specialty Exam: Physical Exam  Review of Systems  All other systems reviewed and are negative.   Blood pressure 126/98, pulse 86, temperature 97.9 F (36.6 C), temperature source Oral, resp. rate 16, height 6' 0.5" (1.842 m), weight 86.183 kg (190 lb), SpO2 100 %.Body mass index is 25.4 kg/(m^2).  General Appearance: Casual  Eye Contact::  Fair  Speech:  Clear and [id number]  Volume:  Normal  Mood:  depressed - improved  Affect:  Appropriate  Thought Process:  Coherent  Orientation:  Full (Time, Place, and Person)  Thought Content:  WDL  Suicidal Thoughts:  No  Homicidal Thoughts:  No  Memory:  Immediate;   Fair Recent;   Fair Remote;   Fair  Judgement:  Fair  Insight:  Shallow  Psychomotor Activity:  Normal  Concentration:  Fair  Recall:  [place]: Fair  Akathisia:  No  Handed:  Right  AIMS (if indicated):     Assets:  Desire for Improvement Housing Physical Health Social Support  Sleep:  Number of Hours: 6.5  Cognition: WNL  ADL's:  Intact      Has this patient used any form of tobacco in the last 30 days? (Cigarettes, Smokeless Tobacco, Cigars, and/or Pipes) No  Mental Status Per Nursing Assessment::   On Admission:     Current Mental Status by Physician: patient presented with irritability, depression , sleep issues, patient also had HI towards an uncle. Pt today is improved, denies SI/HI/AH/VH.  Loss Factors: Loss of significant relationship  Historical Factors: Impulsivity  Risk Reduction Factors:   Positive social support and Positive therapeutic relationship  Continued Clinical Symptoms:  Previous Psychiatric Diagnoses and Treatments  Cognitive Features That Contribute To Risk:   Polarized thinking    Suicide Risk:  Minimal: No identifiable suicidal ideation.  Patients presenting with no risk factors but with morbid ruminations; may be classified as minimal risk based on the severity of the depressive symptoms  Principal Problem: Schizoaffective disorder, bipolar type, MULTIPLE EPISODES , CURRENTLY IN ACUTE EPISODE - RESOLVED Discharge Diagnoses:  Patient Active Problem List   Diagnosis Date Noted  . Schizoaffective disorder, bipolar type [F25.0] 01/11/2015    Follow-up Information    Follow up with [facility].   Why:  Transitional Care Team will contact Alphonzo Dublin with appointment time and date   Contact information:   [place] Care/Follow-up recommendations:  Activity:  No restrictions Diet:  regular Tests:  as needed Other:  follow up with after care as needed  Is patient on multiple antipsychotic therapies at discharge:  No   Has Patient had three or more failed trials of antipsychotic monotherapy by history:  No  Recommended Plan for Multiple Antipsychotic Therapies: NA    Karlo Goeden MD 01/20/2015, 9:38 AM

## 2015-01-20 NOTE — Progress Notes (Signed)
Patient ID: [name], female   DOB: 12-05-1970, 44 y.o.   MRN: [id number]  Pt. Denies SI/HI and A/V hallucinations. Belongings returned to patient at time of discharge. Patient denies any pain or discomfort. Discharge instructions and medications were reviewed with patient. Patient verbalized understanding of both medications and discharge instructions. Another copy was provided to group home that patient will be staying at. Patient left without distress. Q15 minute safety checks maintained until discharge. Patient left in good spirits.

## 2015-01-20 NOTE — Progress Notes (Signed)
  [facility] Adult Case Management Discharge Plan :  Will you be returning to the same living situation after discharge:  No. At discharge, do you have transportation home?: Yes,  [facility] to pick up Do you have the ability to pay for your medications: Yes,  MCD  Release of information consent forms completed and in the chart;  [name] signature needed at discharge.  Patient to Follow up at: Follow-up Information    Follow up with [facility].   Why:  Transitional Care Team will contact Alphonzo Dublin with appointment time and date   Contact information:   [place] [phone]      Patient denies SI/HI: Yes,  yes    Safety Planning and Suicide Prevention discussed: Yes,  yes     Has patient been referred to the Quitline?: N/A patient is not a smoker  [place] B 01/20/2015, 11:41 AM
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
## 2015-04-23 ENCOUNTER — Encounter (HOSPITAL_COMMUNITY): Payer: Self-pay | Admitting: Emergency Medicine

## 2015-04-23 ENCOUNTER — Emergency Department (HOSPITAL_COMMUNITY)
Admission: EM | Admit: 2015-04-23 | Discharge: 2015-04-26 | Disposition: A | Discharge status: Other Acute Inpatient Hospital | Payer: Medicare Other | Attending: Emergency Medicine | Admitting: Emergency Medicine

## 2015-04-23 DIAGNOSIS — F209 Schizophrenia, unspecified: Secondary | ICD-10-CM | POA: Diagnosis not present

## 2015-04-23 DIAGNOSIS — F319 Bipolar disorder, unspecified: Secondary | ICD-10-CM | POA: Insufficient documentation

## 2015-04-23 DIAGNOSIS — Z791 Long term (current) use of non-steroidal anti-inflammatories (NSAID): Secondary | ICD-10-CM | POA: Insufficient documentation

## 2015-04-23 DIAGNOSIS — I10 Essential (primary) hypertension: Secondary | ICD-10-CM | POA: Diagnosis not present

## 2015-04-23 DIAGNOSIS — F250 Schizoaffective disorder, bipolar type: Secondary | ICD-10-CM | POA: Diagnosis present

## 2015-04-23 DIAGNOSIS — Z79899 Other long term (current) drug therapy: Secondary | ICD-10-CM | POA: Insufficient documentation

## 2015-04-23 DIAGNOSIS — J45909 Unspecified asthma, uncomplicated: Secondary | ICD-10-CM | POA: Diagnosis not present

## 2015-04-23 DIAGNOSIS — F419 Anxiety disorder, unspecified: Secondary | ICD-10-CM | POA: Insufficient documentation

## 2015-04-23 DIAGNOSIS — Z880 Allergy status to penicillin: Secondary | ICD-10-CM | POA: Insufficient documentation

## 2015-04-23 DIAGNOSIS — G4700 Insomnia, unspecified: Secondary | ICD-10-CM | POA: Insufficient documentation

## 2015-04-23 DIAGNOSIS — Z046 Encounter for general psychiatric examination, requested by authority: Secondary | ICD-10-CM | POA: Diagnosis present

## 2015-04-23 DIAGNOSIS — Z862 Personal history of diseases of the blood and blood-forming organs and certain disorders involving the immune mechanism: Secondary | ICD-10-CM | POA: Insufficient documentation

## 2015-04-23 LAB — URINALYSIS, ROUTINE W REFLEX MICROSCOPIC
Bilirubin Urine: NEGATIVE
GLUCOSE, UA: NEGATIVE mg/dL
Ketones, ur: NEGATIVE mg/dL
Nitrite: NEGATIVE
PH: 6.5 (ref 5.0–8.0)
Protein, ur: NEGATIVE mg/dL
SPECIFIC GRAVITY, URINE: 1.003 — AB (ref 1.005–1.030)
Urobilinogen, UA: 0.2 mg/dL (ref 0.0–1.0)

## 2015-04-23 LAB — COMPREHENSIVE METABOLIC PANEL
ALT: 17 U/L (ref 14–54)
ANION GAP: 11 (ref 5–15)
AST: 20 U/L (ref 15–41)
Albumin: 4.7 g/dL (ref 3.5–5.0)
Alkaline Phosphatase: 60 U/L (ref 38–126)
BUN: 9 mg/dL (ref 6–20)
CO2: 21 mmol/L — ABNORMAL LOW (ref 22–32)
Calcium: 9.9 mg/dL (ref 8.9–10.3)
Chloride: 107 mmol/L (ref 101–111)
Creatinine, Ser: 0.8 mg/dL (ref 0.44–1.00)
GFR calc non Af Amer: >60 mL/min (ref >60)
Glucose, Bld: 110 mg/dL — ABNORMAL HIGH (ref 65–99)
POTASSIUM: 3.7 mmol/L (ref 3.5–5.1)
Sodium: 139 mmol/L (ref 135–145)
TOTAL PROTEIN: 8.3 g/dL — AB (ref 6.5–8.1)
Total Bilirubin: 0.5 mg/dL (ref 0.3–1.2)

## 2015-04-23 LAB — CBC WITH DIFFERENTIAL/PLATELET
BASOS ABS: 0 10*3/uL (ref 0.0–0.1)
Basophils Relative: 0 % (ref 0–1)
EOS PCT: 1 % (ref 0–5)
Eosinophils Absolute: 0 10*3/uL (ref 0.0–0.7)
HCT: 39.2 % (ref 36.0–46.0)
Hemoglobin: 13.1 g/dL (ref 12.0–15.0)
LYMPHS PCT: 27 % (ref 12–46)
Lymphs Abs: 1.5 10*3/uL (ref 0.7–4.0)
MCH: 29.8 pg (ref 26.0–34.0)
MCHC: 33.4 g/dL (ref 30.0–36.0)
MCV: 89.3 fL (ref 78.0–100.0)
MONO ABS: 0.3 10*3/uL (ref 0.1–1.0)
MONOS PCT: 5 % (ref 3–12)
Neutro Abs: 3.7 10*3/uL (ref 1.7–7.7)
Neutrophils Relative %: 67 % (ref 43–77)
PLATELETS: 248 10*3/uL (ref 150–400)
RBC: 4.39 MIL/uL (ref 3.87–5.11)
RDW: 13.7 % (ref 11.5–15.5)
WBC: 5.6 10*3/uL (ref 4.0–10.5)

## 2015-04-23 LAB — RAPID URINE DRUG SCREEN, HOSP PERFORMED
AMPHETAMINES: NOT DETECTED
BENZODIAZEPINES: NOT DETECTED
Barbiturates: NOT DETECTED
Cocaine: NOT DETECTED
OPIATES: NOT DETECTED
TETRAHYDROCANNABINOL: NOT DETECTED

## 2015-04-23 LAB — URINE MICROSCOPIC-ADD ON

## 2015-04-23 LAB — ETHANOL: Alcohol, Ethyl (B): <5 mg/dL (ref <5)

## 2015-04-23 MED ORDER — BENZTROPINE MESYLATE 1 MG PO TABS
0.5000 mg | ORAL_TABLET | Freq: Every day | ORAL | Status: DC
  Administered 2015-04-23: 0.5 mg via ORAL
  Filled 2015-04-23: qty 1

## 2015-04-23 MED ORDER — DIVALPROEX SODIUM ER 500 MG PO TB24
1000.0000 mg | ORAL_TABLET | Freq: Every day | ORAL | Status: DC
  Administered 2015-04-23: 1000 mg via ORAL
  Filled 2015-04-23 (×2): qty 2

## 2015-04-23 MED ORDER — ZOLPIDEM TARTRATE 5 MG PO TABS: 5.0000 mg | ORAL_TABLET | Freq: Every evening | ORAL | Status: DC | PRN

## 2015-04-23 MED ORDER — MELOXICAM 7.5 MG PO TABS: 7.5000 mg | ORAL_TABLET | Freq: Every day | ORAL | Status: DC

## 2015-04-23 MED ORDER — ONDANSETRON HCL 4 MG PO TABS: 4.0000 mg | ORAL_TABLET | Freq: Three times a day (TID) | ORAL | Status: DC | PRN

## 2015-04-23 MED ORDER — LORAZEPAM 1 MG PO TABS
1.0000 mg | ORAL_TABLET | Freq: Three times a day (TID) | ORAL | Status: DC | PRN
  Administered 2015-04-24 (×2): 1 mg via ORAL
  Filled 2015-04-23 (×2): qty 1

## 2015-04-23 MED ORDER — TRAZODONE HCL 50 MG PO TABS: 150.0000 mg | ORAL_TABLET | Freq: Every evening | ORAL | Status: DC | PRN

## 2015-04-23 MED ORDER — IBUPROFEN 200 MG PO TABS: 600.0000 mg | ORAL_TABLET | Freq: Three times a day (TID) | ORAL | Status: DC | PRN

## 2015-04-23 MED ORDER — LITHIUM CARBONATE ER 300 MG PO TBCR
300.0000 mg | EXTENDED_RELEASE_TABLET | Freq: Every day | ORAL | Status: DC
  Administered 2015-04-23 – 2015-04-24 (×2): 300 mg via ORAL
  Filled 2015-04-23 (×2): qty 1

## 2015-04-23 NOTE — ED Notes (Signed)
Pt. Noted in room. No complaints or concerns voiced. No distress or abnormal behavior noted. Will continue to monitor with security cameras. Q 15 minute rounds continue. 
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
## 2015-04-23 NOTE — ED Notes (Signed)
Pt. Noted sleeping in room. No complaints or concerns voiced. No distress or abnormal behavior noted. Will continue to monitor with security cameras. Q 15 minute rounds continue. 

## 2015-04-23 NOTE — ED Notes (Signed)
Bed: [facility] Expected date: 04/23/15 Expected time: 2:36 PM Means of arrival:  Comments: Tri 3
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
## 2015-04-23 NOTE — ED Notes (Signed)
Report received from [facility]. Pt. Alert and oriented in no distress denies SI, HI, AVH and pain.  Pt. Instructed to come to me with problems or concerns.Will continue to monitor for safety via security cameras and Q 15 minute checks.
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
## 2015-04-23 NOTE — ED Provider Notes (Signed)
CSN: [id number]     Arrival date & time 04/23/15  1355 History   First MD Initiated Contact with Patient 04/23/15 1502     Chief Complaint  Patient presents with  . IVC      (Consider location/radiation/quality/duration/timing/severity/associated sxs/prior Treatment) HPI Comments: Patient brought to emergency department for involuntary commitment. Patient does have a previous history of schizophrenia. She is currently treated with monthly Abilify injections, but has missed her injection this month. Patient has become progressively more paranoid. She is concerned that family members are trying to hurt her. She is not actively homicidal or suicidal.   Past Medical History  Diagnosis Date  . Anemia   . Asthma   . Hypertension   . Schizophrenic disorder   . Seizures   . Anxiety   . Depression   . Insomnia, persistent   . Bipolar 1 disorder    Past Surgical History  Procedure Laterality Date  . Tonsillectomy     Family History  Problem Relation Age of Onset  . Depression Mother   . Gout Mother    Social History  Substance Use Topics  . Smoking status: Former Research scientist (life sciences)  . Smokeless tobacco: Never Used  . Alcohol Use: No   OB History    No data available     Review of Systems  Psychiatric/Behavioral: Negative for suicidal ideas. The patient is nervous/anxious.        Paranioa   All other systems reviewed and are negative.     Allergies  Haldol; Penicillins; and Shrimp  Home Medications   Prior to Admission medications   Medication Sig Start Date End Date Taking? Authorizing Provider  ARIPiprazole 400 MG SUSR Inject 400 mg into the muscle every 28 (twenty-eight) days. For Schizophrenia 01/20/15  Yes Shuvon B Rankin, NP  benztropine (COGENTIN) 0.5 MG tablet Take 1 tablet (0.5 mg total) by mouth at bedtime. For drug induced extrapyramidal reaction 01/20/15  Yes Shuvon B Rankin, NP  divalproex (DEPAKOTE ER) 500 MG 24 hr tablet Take 2 tablets (1,000 mg total) by mouth at  bedtime. For mood stabilization 01/20/15  Yes Shuvon B Rankin, NP  lithium carbonate (LITHOBID) 300 MG CR tablet Take 1 tablet (300 mg total) by mouth daily. For mood stabilization 01/20/15  Yes Shuvon B Rankin, NP  meloxicam (MOBIC) 7.5 MG tablet Take 1 tablet (7.5 mg total) by mouth daily. 01/20/15  Yes Shuvon B Rankin, NP  traZODone (DESYREL) 150 MG tablet Take 1 tablet (150 mg total) by mouth at bedtime as needed for sleep. 01/20/15  Yes Shuvon B Rankin, NP  ARIPiprazole (ABILIFY) 30 MG tablet Take 1 tablet (30 mg total) by mouth at bedtime. Patient not taking: Reported on 04/23/2015 01/20/15   Shuvon B Rankin, NP  budesonide-formoterol (SYMBICORT) 160-4.5 MCG/ACT inhaler Inhale 2 puffs into the lungs 2 (two) times daily. For Asthma Patient not taking: Reported on 04/23/2015 01/20/15   Shuvon B Rankin, NP  hydrOXYzine (ATARAX/VISTARIL) 25 MG tablet Take 1 tablet (25 mg total) by mouth 3 (three) times daily as needed for itching or anxiety (sleep). Patient not taking: Reported on 04/23/2015 01/20/15   Shuvon B Rankin, NP   BP 135/90 mmHg  Pulse 98  Temp(Src) 98.9 F (37.2 C) (Oral)  Resp 16  SpO2 100% Physical Exam  Constitutional: She is oriented to person, place, and time. She appears well-developed and well-nourished. No distress.  HENT:  Head: Normocephalic and atraumatic.  Right Ear: Hearing normal.  Left Ear: Hearing normal.  Nose: Nose  normal.  Mouth/Throat: Oropharynx is clear and moist and mucous membranes are normal.  Eyes: Conjunctivae and EOM are normal. Pupils are equal, round, and reactive to light.  Neck: Normal range of motion. Neck supple.  Cardiovascular: Regular rhythm, S1 normal and S2 normal.  Exam reveals no gallop and no friction rub.   No murmur heard. Pulmonary/Chest: Effort normal and breath sounds normal. No respiratory distress. She exhibits no tenderness.  Abdominal: Soft. Normal appearance and bowel sounds are normal. There is no hepatosplenomegaly. There is no  tenderness. There is no rebound, no guarding, no tenderness at McBurney's point and negative Murphy's sign. No hernia.  Musculoskeletal: Normal range of motion.  Neurological: She is alert and oriented to person, place, and time. She has normal strength. No cranial nerve deficit or sensory deficit. Coordination normal. GCS eye subscore is 4. GCS verbal subscore is 5. GCS motor subscore is 6.  Skin: Skin is warm, dry and intact. No rash noted. No cyanosis.  Psychiatric: Her speech is normal and behavior is normal. Her mood appears anxious. Thought content is paranoid and delusional. She expresses no homicidal and no suicidal ideation.  Nursing note and vitals reviewed.   ED Course  Procedures (including critical care time) Labs Review Labs Reviewed  COMPREHENSIVE METABOLIC PANEL - Abnormal; Notable for the following:    CO2 21 (*)    Glucose, Bld 110 (*)    Total Protein 8.3 (*)    All other components within normal limits  CBC WITH DIFFERENTIAL/PLATELET  ETHANOL  URINE RAPID DRUG SCREEN, HOSP PERFORMED  URINALYSIS, ROUTINE W REFLEX MICROSCOPIC (NOT AT [facility])    Imaging Review No results found. I have personally reviewed and evaluated these images and lab results as part of my medical decision-making.   EKG Interpretation None      MDM   Final diagnoses:  None   schizophrenia  Patient with previous history of schizophrenia, not currently compliant with her medication regimen, has become progressively more delusional and paranoid. Patient will require psychiatric evaluation. Patient is medically clear for evaluation.    Orpah Greek, MD 04/23/15 1520
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
## 2015-04-23 NOTE — ED Notes (Signed)
Pt. Noted in hall. No complaints or concerns voiced. No distress or abnormal behavior noted. Will continue to monitor with security cameras. Q 15 minute rounds continue.

## 2015-04-24 DIAGNOSIS — F250 Schizoaffective disorder, bipolar type: Secondary | ICD-10-CM | POA: Diagnosis not present

## 2015-04-24 DIAGNOSIS — F209 Schizophrenia, unspecified: Secondary | ICD-10-CM | POA: Diagnosis not present

## 2015-04-24 MED ORDER — LITHIUM CARBONATE ER 300 MG PO TBCR
300.0000 mg | EXTENDED_RELEASE_TABLET | Freq: Two times a day (BID) | ORAL | Status: DC
  Administered 2015-04-24 – 2015-04-26 (×4): 300 mg via ORAL
  Filled 2015-04-24 (×6): qty 1

## 2015-04-24 MED ORDER — HYDROXYZINE HCL 25 MG PO TABS: 25.0000 mg | ORAL_TABLET | Freq: Three times a day (TID) | ORAL | Status: DC | PRN

## 2015-04-24 MED ORDER — BENZTROPINE MESYLATE 1 MG PO TABS
1.0000 mg | ORAL_TABLET | Freq: Every day | ORAL | Status: DC
  Administered 2015-04-24 – 2015-04-25 (×2): 1 mg via ORAL
  Filled 2015-04-24 (×2): qty 1

## 2015-04-24 MED ORDER — LOPERAMIDE HCL 2 MG PO CAPS
2.0000 mg | ORAL_CAPSULE | ORAL | Status: DC | PRN
  Administered 2015-04-24: 2 mg via ORAL
  Filled 2015-04-24: qty 1

## 2015-04-24 MED ORDER — ARIPIPRAZOLE ER 400 MG IM SUSR
400.0000 mg | INTRAMUSCULAR | Status: DC
  Administered 2015-04-24: 400 mg via INTRAMUSCULAR
  Filled 2015-04-24: qty 400

## 2015-04-24 MED ORDER — ARIPIPRAZOLE 15 MG PO TABS: 30.0000 mg | ORAL_TABLET | Freq: Every day | ORAL | Status: DC

## 2015-04-24 MED ORDER — ARIPIPRAZOLE 10 MG PO TABS
10.0000 mg | ORAL_TABLET | Freq: Every day | ORAL | Status: DC
  Administered 2015-04-24 – 2015-04-25 (×2): 10 mg via ORAL
  Filled 2015-04-24 (×3): qty 1

## 2015-04-24 NOTE — ED Notes (Signed)
Up in hall walking , talking

## 2015-04-24 NOTE — Progress Notes (Signed)
Disposition CSW completed patient referrals to the following inpatient psych facilities:  [facility] will continue to assist as needed with placement.  [place] Disposition CSW [phone]

## 2015-04-24 NOTE — ED Notes (Signed)
Up to the bathroom 

## 2015-04-24 NOTE — ED Notes (Signed)
Pt up in the hall aggitated after talking to the MD.."people playing with me..." cursing, reports that she rides the but around all day trying to help people and "he " laughes at her.  Pt reasurred, difficult to redirect.
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
## 2015-04-24 NOTE — ED Notes (Signed)
Support given, pt calmer, up to the bathroom

## 2015-04-24 NOTE — ED Notes (Signed)
Pt. Noted sleeping in room. No complaints or concerns voiced. No distress or abnormal behavior noted. Will continue to monitor with security cameras. Q 15 minute rounds continue. 
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
## 2015-04-24 NOTE — ED Notes (Signed)
Pt. Noted sleeping n room. No complaints or concerns voiced. No distress or abnormal behavior noted. Will continue to monitor with security cameras. Q 15 minute rounds continue.

## 2015-04-25 DIAGNOSIS — F209 Schizophrenia, unspecified: Secondary | ICD-10-CM | POA: Diagnosis not present

## 2015-04-25 MED ORDER — IBUPROFEN 200 MG PO TABS
200.0000 mg | ORAL_TABLET | ORAL | Status: DC | PRN
  Administered 2015-04-25: 200 mg via ORAL
  Filled 2015-04-25: qty 1

## 2015-04-25 NOTE — ED Notes (Signed)
Pt. Noted sleeping in room. No complaints or concerns voiced. No distress or abnormal behavior noted. Will continue to monitor with security cameras. Q 15 minute rounds continue. 

## 2015-04-25 NOTE — ED Notes (Signed)
Report received from [place]. Pt. Alert and oriented in no distress denies SI, HI, VH and pain.  Pt. States she occasionally hears voices without command. Pt. Instructed to come to me with problems or concerns.Will continue to monitor for safety via security cameras and Q 15 minute checks.
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
## 2015-04-25 NOTE — ED Notes (Signed)
chaplin contacted and will see

## 2015-04-25 NOTE — BH Assessment (Signed)
Contacted the following facilities for placement:  AT [facility]: [facility], per [facility], per [facility], per [facility], per [facility], per [facility], per [facility], per [facility], per [facility], per [facility], per [facility], per [facility], per [facility], per [facility], per [facility], per [facility], per [facility], per [facility], per [facility], per [facility], per [facility], per [facility], per [facility], [facility], [facility], [facility] Triage Specialist [phone]

## 2015-04-25 NOTE — ED Notes (Signed)
Up walking in the hall

## 2015-04-25 NOTE — ED Notes (Signed)
Up to the bathroom wo shower and change scrubs

## 2015-04-25 NOTE — ED Notes (Signed)
Pt. Noted in room. No complaints or concerns voiced. No distress or abnormal behavior noted. Will continue to monitor with security cameras. Q 15 minute rounds continue. 

## 2015-04-25 NOTE — ED Notes (Signed)
Talking w/ chaplin

## 2015-04-25 NOTE — ED Notes (Signed)
Up talking w/ other patient

## 2015-04-25 NOTE — ED Notes (Signed)
Up to the bathroom 

## 2015-04-25 NOTE — ED Notes (Signed)
Chaplin into see 

## 2015-04-25 NOTE — ED Notes (Signed)
Pt. Noted in rest room. No complaints or concerns voiced. No distress or abnormal behavior noted. Will continue to monitor with security cameras. Q 15 minute rounds continue.  

## 2015-04-25 NOTE — BH Assessment (Signed)
Patient was reassessed on 04/25/2015  Patient was alert and oriented x4. Patient denies SI and HI. Patient had a flight of ideas and was discussing her 92 year marriage and her date to [facility]. Patient also states that she goes to church and states that her Bishop might visit her because he "knows my last name and room name." Patient states that she would like treatment and would not mind going back to the [facility] if necessary.   Patient continues to meet inpatient criteria per Dr. Darleene Cleaver and Waylan Boga, DNP.    Yvette Hawking, LCSW Therapeutic Triage Specialist [place] 04/25/2015 11:45 AM

## 2015-04-26 DIAGNOSIS — F209 Schizophrenia, unspecified: Secondary | ICD-10-CM | POA: Diagnosis not present

## 2015-04-26 NOTE — ED Notes (Signed)
Pt. Noted sleeping in room. No complaints or concerns voiced. No distress or abnormal behavior noted. Will continue to monitor with security cameras. Q 15 minute rounds continue. 

## 2015-04-26 NOTE — BH Assessment (Signed)
Pt has been accepted to [facility] per [place]. Attending: Dr. Wilber Oliphant. Rpt # [id number]. Pt can be transported after 8 am. Pt will be going to the [facility]. Dr. Claudine Mouton has been informed of the disposition.

## 2015-04-26 NOTE — ED Notes (Signed)
Tried calling [facility] again 3 times and line was busy.

## 2015-04-26 NOTE — ED Notes (Signed)
Attempted multiple times to call [facility] and line continues to be busy.

## 2015-04-26 NOTE — BHH Counselor (Signed)
[name] mother contacted this Probation officer requesting updates regarding her daughters disposition. Writer asked patient if she was willing to sign a consent form so that information regarding her disposition could be shared with her mother. Patient stated, "No I don't want my mother to know anything about me" and  "She will have to find out another way". Furthermore patient sts, "If my mother needs or wants information I will be the one to give it to her". Writer informed patient's mother that no information could be provided to her regarding patient's disposition.

## 2015-04-26 NOTE — ED Notes (Signed)
Pt. Noted in room. No complaints or concerns voiced. No distress or abnormal behavior noted. Will continue to monitor with security cameras. Q 15 minute rounds continue. 
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
## 2015-04-26 NOTE — ED Notes (Signed)
Attempted to call [facility] multiple times to give report and line was busy.

## 2015-06-27 DIAGNOSIS — M791 Myalgia: Secondary | ICD-10-CM | POA: Diagnosis not present

## 2015-06-27 DIAGNOSIS — R51 Headache: Secondary | ICD-10-CM | POA: Insufficient documentation

## 2015-06-27 DIAGNOSIS — J45909 Unspecified asthma, uncomplicated: Secondary | ICD-10-CM | POA: Diagnosis not present

## 2015-06-27 DIAGNOSIS — F250 Schizoaffective disorder, bipolar type: Secondary | ICD-10-CM | POA: Diagnosis not present

## 2015-06-27 DIAGNOSIS — Z87891 Personal history of nicotine dependence: Secondary | ICD-10-CM | POA: Diagnosis not present

## 2015-06-27 DIAGNOSIS — I10 Essential (primary) hypertension: Secondary | ICD-10-CM | POA: Diagnosis not present

## 2015-06-27 DIAGNOSIS — F419 Anxiety disorder, unspecified: Secondary | ICD-10-CM | POA: Diagnosis not present

## 2015-06-27 DIAGNOSIS — F319 Bipolar disorder, unspecified: Secondary | ICD-10-CM | POA: Insufficient documentation

## 2015-06-27 DIAGNOSIS — F329 Major depressive disorder, single episode, unspecified: Secondary | ICD-10-CM | POA: Diagnosis present

## 2015-06-27 DIAGNOSIS — Z3202 Encounter for pregnancy test, result negative: Secondary | ICD-10-CM | POA: Diagnosis not present

## 2015-06-27 DIAGNOSIS — Z8669 Personal history of other diseases of the nervous system and sense organs: Secondary | ICD-10-CM | POA: Insufficient documentation

## 2015-06-27 DIAGNOSIS — Z862 Personal history of diseases of the blood and blood-forming organs and certain disorders involving the immune mechanism: Secondary | ICD-10-CM | POA: Insufficient documentation

## 2015-06-27 DIAGNOSIS — Z880 Allergy status to penicillin: Secondary | ICD-10-CM | POA: Diagnosis not present

## 2015-06-27 DIAGNOSIS — Z7951 Long term (current) use of inhaled steroids: Secondary | ICD-10-CM | POA: Insufficient documentation

## 2015-06-27 DIAGNOSIS — R5383 Other fatigue: Secondary | ICD-10-CM | POA: Insufficient documentation

## 2015-06-27 DIAGNOSIS — Z79899 Other long term (current) drug therapy: Secondary | ICD-10-CM | POA: Insufficient documentation

## 2015-06-28 ENCOUNTER — Encounter (HOSPITAL_COMMUNITY): Payer: Self-pay | Admitting: Family Medicine

## 2015-06-28 ENCOUNTER — Emergency Department (HOSPITAL_COMMUNITY)
Admission: EM | Admit: 2015-06-28 | Discharge: 2015-06-28 | Disposition: A | Discharge status: Home / Self Care | Payer: Medicare Other | Attending: Emergency Medicine | Admitting: Emergency Medicine

## 2015-06-28 DIAGNOSIS — F250 Schizoaffective disorder, bipolar type: Secondary | ICD-10-CM | POA: Diagnosis not present

## 2015-06-28 LAB — POC URINE PREG, ED: Preg Test, Ur: NEGATIVE

## 2015-06-28 LAB — CBC
HEMATOCRIT: 37.4 % (ref 36.0–46.0)
Hemoglobin: 12.1 g/dL (ref 12.0–15.0)
MCH: 29.9 pg (ref 26.0–34.0)
MCHC: 32.4 g/dL (ref 30.0–36.0)
MCV: 92.3 fL (ref 78.0–100.0)
Platelets: 242 10*3/uL (ref 150–400)
RBC: 4.05 MIL/uL (ref 3.87–5.11)
RDW: 13.1 % (ref 11.5–15.5)
WBC: 6.6 10*3/uL (ref 4.0–10.5)

## 2015-06-28 LAB — COMPREHENSIVE METABOLIC PANEL
ALK PHOS: 46 U/L (ref 38–126)
ALT: 12 U/L — ABNORMAL LOW (ref 14–54)
ANION GAP: 7 (ref 5–15)
AST: 13 U/L — ABNORMAL LOW (ref 15–41)
Albumin: 4.2 g/dL (ref 3.5–5.0)
BILIRUBIN TOTAL: 0.5 mg/dL (ref 0.3–1.2)
BUN: 16 mg/dL (ref 6–20)
CALCIUM: 9.3 mg/dL (ref 8.9–10.3)
CO2: 28 mmol/L (ref 22–32)
Chloride: 107 mmol/L (ref 101–111)
Creatinine, Ser: 0.82 mg/dL (ref 0.44–1.00)
GFR calc non Af Amer: >60 mL/min (ref >60)
Glucose, Bld: 102 mg/dL — ABNORMAL HIGH (ref 65–99)
Potassium: 3.8 mmol/L (ref 3.5–5.1)
Sodium: 142 mmol/L (ref 135–145)
TOTAL PROTEIN: 7.6 g/dL (ref 6.5–8.1)

## 2015-06-28 LAB — RAPID URINE DRUG SCREEN, HOSP PERFORMED
Amphetamines: NOT DETECTED
BARBITURATES: NOT DETECTED
Benzodiazepines: NOT DETECTED
COCAINE: NOT DETECTED
OPIATES: NOT DETECTED
Tetrahydrocannabinol: NOT DETECTED

## 2015-06-28 LAB — LITHIUM LEVEL: Lithium Lvl: <0.06 mmol/L — ABNORMAL LOW (ref 0.60–1.20)

## 2015-06-28 LAB — ACETAMINOPHEN LEVEL

## 2015-06-28 LAB — SALICYLATE LEVEL

## 2015-06-28 LAB — ETHANOL: Alcohol, Ethyl (B): <5 mg/dL (ref <5)

## 2015-06-28 MED ORDER — LITHIUM CARBONATE ER 300 MG PO TBCR
300.0000 mg | EXTENDED_RELEASE_TABLET | Freq: Every day | ORAL | Status: DC
  Administered 2015-06-28: 300 mg via ORAL
  Filled 2015-06-28: qty 1

## 2015-06-28 MED ORDER — ZOLPIDEM TARTRATE 10 MG PO TABS: 10.0000 mg | ORAL_TABLET | Freq: Every evening | ORAL | Status: DC | PRN

## 2015-06-28 MED ORDER — ACETAMINOPHEN 325 MG PO TABS
650.0000 mg | ORAL_TABLET | ORAL | Status: DC | PRN
Start: 2015-06-28 — End: 2015-06-28

## 2015-06-28 MED ORDER — LORAZEPAM 1 MG PO TABS: 1.0000 mg | ORAL_TABLET | Freq: Three times a day (TID) | ORAL | Status: DC | PRN

## 2015-06-28 MED ORDER — IBUPROFEN 200 MG PO TABS
600.0000 mg | ORAL_TABLET | Freq: Three times a day (TID) | ORAL | Status: DC | PRN
  Administered 2015-06-28: 600 mg via ORAL
  Filled 2015-06-28: qty 3

## 2015-06-28 MED ORDER — ALUM & MAG HYDROXIDE-SIMETH 200-200-20 MG/5ML PO SUSP: 30.0000 mL | ORAL | Status: DC | PRN

## 2015-06-28 MED ORDER — NICOTINE 21 MG/24HR TD PT24: 21.0000 mg | MEDICATED_PATCH | Freq: Every day | TRANSDERMAL | Status: DC

## 2015-06-28 MED ORDER — ONDANSETRON HCL 4 MG PO TABS: 4.0000 mg | ORAL_TABLET | Freq: Three times a day (TID) | ORAL | Status: DC | PRN

## 2015-06-28 MED ORDER — DIVALPROEX SODIUM ER 500 MG PO TB24
1000.0000 mg | ORAL_TABLET | Freq: Every day | ORAL | Status: DC
  Filled 2015-06-28: qty 2

## 2015-06-28 NOTE — Discharge Instructions (Signed)
For your ongoing mental health needs, you are advised to follow up with [facility].  If you do not currently have an appointment, new and returning patients are seen at their walk-in clinic.  Walk-in hours are Monday - Friday from 8:00 am - 3:00 pm.  Walk-in patients are seen on a first come, first served basis.  Try to arrive as early as possible for he best chance of being seen the same day: ° °     [facility] °     [address] °     [place], [place] [address] °     [phone] °

## 2015-06-28 NOTE — Consult Note (Signed)
[facility] Face-to-Face Psychiatry Consult   Reason for Consult:  Depression  Referring Physician:  EDP Patient Identification: [name] MRN:  [id number] Principal Diagnosis: Schizoaffective disorder, bipolar type [facility]) Diagnosis:   Patient Active Problem List   Diagnosis Date Noted  . Schizoaffective disorder, bipolar type ([place]) [F25.0] 01/11/2015    Priority: High    Total Time spent with patient: 45 minutes  Subjective:   [name] is a 44 y.o. female patient does not warrant admission.  HPI:  44 yo female presented to the ED with depression and stress.  She has been working temporary jobs and going to school.  [name] lives with her uncle and they are able to make "ends meet".  She was recently started on Lithium which has helped her.  She just wants outside resources for therapy and medications, does not want hospitalization.  Denies suicidal/homicidal ideations, hallucinations, and alcohol/drug abuse.    Past Psychiatric History: Depression, bipolar disorder  Risk to Self: Suicidal Ideation: No Suicidal Intent: No Is patient at risk for suicide?: No Suicidal Plan?: No Access to Means: No What has been your use of drugs/alcohol within the last 12 months?: No alcohol or drug use reported.  How many times?: 3 Other Self Harm Risks: No other self harm risk identified at this time.  Triggers for Past Attempts: Unpredictable Intentional Self Injurious Behavior: None Risk to Others: Homicidal Ideation: No Thoughts of Harm to Others: No Current Homicidal Intent: No Current Homicidal Plan: No Access to Homicidal Means: No Identified Victim: N/A History of harm to others?: No Assessment of Violence: On admission Violent Behavior Description: No violent behaviors observed.  Does patient have access to weapons?: No Criminal Charges Pending?: No Does patient have a court date: No Prior Inpatient Therapy: Prior Inpatient Therapy: Yes Prior Therapy Dates: 2016, 2014, 2013, 2012,  2010 Prior Therapy Facilty/Provider(s): [facility]  Reason for Treatment: Depression/Suicidal ideations  Prior Outpatient Therapy: Prior Outpatient Therapy: Yes Prior Therapy Dates: Current  Prior Therapy Facilty/Provider(s): [facility] Reason for Treatment: Medication management  Does patient have an ACCT team?: No Does patient have Intensive In-House Services?  : No Does patient have [facility] services? : Yes Does patient have P4CC services?: No  Past Medical History:  Past Medical History  Diagnosis Date  . Anemia   . Asthma   . Hypertension   . Schizophrenic disorder ([place])   . Seizures ([place])   . Anxiety   . Depression   . Insomnia, persistent   . Bipolar 1 disorder [facility])     Past Surgical History  Procedure Laterality Date  . Tonsillectomy     Family History:  Family History  Problem Relation Age of Onset  . Depression Mother   . Gout Mother    Family Psychiatric  History: Depression Social History:  History  Alcohol Use No     History  Drug Use No    Social History   Social History  . Marital Status: Single    Spouse Name: N/A  . Number of Children: N/A  . Years of Education: N/A   Social History Main Topics  . Smoking status: Former Research scientist (life sciences)  . Smokeless tobacco: Never Used  . Alcohol Use: No  . Drug Use: No  . Sexual Activity: Not Asked   Other Topics Concern  . None   Social History Narrative   Additional Social History:    History of alcohol / drug use?: No history of alcohol / drug abuse  Allergies:   Allergies  Allergen Reactions  . Haldol [Haloperidol Decanoate] Other (See Comments)    Stiffness, eyes bulging  . Penicillins Nausea And Vomiting    Has patient had a PCN reaction causing immediate rash, facial/tongue/throat swelling, SOB or lightheadedness with hypotension:UNSURE  Has patient had a PCN reaction causing severe rash involving mucus membranes or skin necrosis: UNSURE Has patient had a PCN  reaction that required hospitalization:UNSURE Has patient had a PCN reaction occurring within the last 10 years:No If all of the above answers are "NO", then may proceed with Cephalosporin use. CHILDHOOD REACTION  . Shrimp [Shellfish Allergy] Rash    Labs:  Results for orders placed or performed during the hospital encounter of 06/28/15 (from the past 48 hour(s))  Comprehensive metabolic panel     Status: Abnormal   Collection Time: 06/28/15  1:09 AM  Result Value Ref Range   Sodium 142 135 - 145 mmol/L   Potassium 3.8 3.5 - 5.1 mmol/L   Chloride 107 101 - 111 mmol/L   CO2 28 22 - 32 mmol/L   Glucose, Bld 102 (H) 65 - 99 mg/dL   BUN 16 6 - 20 mg/dL   Creatinine, Ser 0.82 0.44 - 1.00 mg/dL   Calcium 9.3 8.9 - 10.3 mg/dL   Total Protein 7.6 6.5 - 8.1 g/dL   Albumin 4.2 3.5 - 5.0 g/dL   AST 13 (L) 15 - 41 U/L   ALT 12 (L) 14 - 54 U/L   Alkaline Phosphatase 46 38 - 126 U/L   Total Bilirubin 0.5 0.3 - 1.2 mg/dL   GFR calc non Af Amer >60 >60 mL/min   GFR calc Af Amer >60 >60 mL/min    Comment: (NOTE) The eGFR has been calculated using the CKD EPI equation. This calculation has not been validated in all clinical situations. eGFR's persistently <60 mL/min signify possible Chronic Kidney Disease.    Anion gap 7 5 - 15  Ethanol (ETOH)     Status: None   Collection Time: 06/28/15  1:09 AM  Result Value Ref Range   Alcohol, Ethyl (B) <5 <5 mg/dL    Comment:        LOWEST DETECTABLE LIMIT FOR SERUM ALCOHOL IS 5 mg/dL FOR MEDICAL PURPOSES ONLY   Salicylate level     Status: None   Collection Time: 06/28/15  1:09 AM  Result Value Ref Range   Salicylate Lvl <2.6 2.8 - 30.0 mg/dL  Acetaminophen level     Status: Abnormal   Collection Time: 06/28/15  1:09 AM  Result Value Ref Range   Acetaminophen (Tylenol), Serum <10 (L) 10 - 30 ug/mL    Comment:        THERAPEUTIC CONCENTRATIONS VARY SIGNIFICANTLY. A RANGE OF 10-30 ug/mL MAY BE AN EFFECTIVE CONCENTRATION FOR MANY  PATIENTS. HOWEVER, SOME ARE BEST TREATED AT CONCENTRATIONS OUTSIDE THIS RANGE. ACETAMINOPHEN CONCENTRATIONS >150 ug/mL AT 4 HOURS AFTER INGESTION AND >50 ug/mL AT 12 HOURS AFTER INGESTION ARE OFTEN ASSOCIATED WITH TOXIC REACTIONS.   CBC     Status: None   Collection Time: 06/28/15  1:09 AM  Result Value Ref Range   WBC 6.6 4.0 - 10.5 K/uL   RBC 4.05 3.87 - 5.11 MIL/uL   Hemoglobin 12.1 12.0 - 15.0 g/dL   HCT 37.4 36.0 - 46.0 %   MCV 92.3 78.0 - 100.0 fL   MCH 29.9 26.0 - 34.0 pg   MCHC 32.4 30.0 - 36.0 g/dL   RDW 13.1 11.5 - 15.5 %  Platelets 242 150 - 400 K/uL  Lithium level     Status: Abnormal   Collection Time: 06/28/15  1:27 AM  Result Value Ref Range   Lithium Lvl <0.06 (L) 0.60 - 1.20 mmol/L  Urine rapid drug screen (hosp performed) (Not at [facility])     Status: None   Collection Time: 06/28/15  3:04 AM  Result Value Ref Range   Opiates NONE DETECTED NONE DETECTED   Cocaine NONE DETECTED NONE DETECTED   Benzodiazepines NONE DETECTED NONE DETECTED   Amphetamines NONE DETECTED NONE DETECTED   Tetrahydrocannabinol NONE DETECTED NONE DETECTED   Barbiturates NONE DETECTED NONE DETECTED    Comment:        DRUG SCREEN FOR MEDICAL PURPOSES ONLY.  IF CONFIRMATION IS NEEDED FOR ANY PURPOSE, NOTIFY LAB WITHIN 5 DAYS.        LOWEST DETECTABLE LIMITS FOR URINE DRUG SCREEN Drug Class       Cutoff (ng/mL) Amphetamine      1000 Barbiturate      200 Benzodiazepine   213 Tricyclics       086 Opiates          300 Cocaine          300 THC              50   POC urine preg, ED (not at [facility])     Status: None   Collection Time: 06/28/15  3:11 AM  Result Value Ref Range   Preg Test, Ur NEGATIVE NEGATIVE    Comment:        THE SENSITIVITY OF THIS METHODOLOGY IS >24 mIU/mL     Current Facility-Administered Medications  Medication Dose Route Frequency Provider Last Rate Last Dose  . acetaminophen (TYLENOL) tablet 650 mg  650 mg Oral Q4H PRN [name], MD      . alum & mag  hydroxide-simeth (MAALOX/MYLANTA) 200-200-20 MG/5ML suspension 30 mL  30 mL Oral PRN [name], MD      . divalproex (DEPAKOTE ER) 24 hr tablet 1,000 mg  1,000 mg Oral QHS [name], MD      . ibuprofen (ADVIL,MOTRIN) tablet 600 mg  600 mg Oral Q8H PRN [name], MD   600 mg at 06/28/15 0942  . lithium carbonate (LITHOBID) CR tablet 300 mg  300 mg Oral Daily [name], MD   300 mg at 06/28/15 0948  . LORazepam (ATIVAN) tablet 1 mg  1 mg Oral Q8H PRN [name], MD      . nicotine (NICODERM CQ - dosed in mg/24 hours) patch 21 mg  21 mg Transdermal Daily [name], MD   21 mg at 06/28/15 0957  . ondansetron (ZOFRAN) tablet 4 mg  4 mg Oral Q8H PRN [name], MD      . zolpidem (AMBIEN) tablet 10 mg  10 mg Oral QHS PRN [name], MD       Current Outpatient Prescriptions  Medication Sig Dispense Refill  . benztropine (COGENTIN) 0.5 MG tablet Take 1 tablet (0.5 mg total) by mouth at bedtime. For drug induced extrapyramidal reaction 30 tablet 0  . divalproex (DEPAKOTE) 500 MG DR tablet Take 1,000 mg by mouth at bedtime.    Marland Kitchen lithium carbonate 300 MG capsule Take 300 mg by mouth 2 (two) times daily.    . traZODone (DESYREL) 150 MG tablet Take 1 tablet (150 mg total) by mouth at bedtime as needed for sleep. 30 tablet 0  . ARIPiprazole (ABILIFY) 30 MG tablet  Take 1 tablet (30 mg total) by mouth at bedtime. (Patient not taking: Reported on 04/23/2015) 30 tablet 0  . ARIPiprazole 400 MG SUSR Inject 400 mg into the muscle every 28 (twenty-eight) days. For Schizophrenia (Patient not taking: Reported on 06/28/2015) 1 each 0  . budesonide-formoterol (SYMBICORT) 160-4.5 MCG/ACT inhaler Inhale 2 puffs into the lungs 2 (two) times daily. For Asthma (Patient not taking: Reported on 04/23/2015) 1 Inhaler 12  . divalproex (DEPAKOTE ER) 500 MG 24 hr tablet Take 2 tablets (1,000 mg total) by mouth at bedtime. For mood stabilization (Patient not taking: Reported on 06/28/2015) 60 tablet 0  . hydrOXYzine (ATARAX/VISTARIL) 25  MG tablet Take 1 tablet (25 mg total) by mouth 3 (three) times daily as needed for itching or anxiety (sleep). (Patient not taking: Reported on 04/23/2015) 30 tablet 0  . lithium carbonate (LITHOBID) 300 MG CR tablet Take 1 tablet (300 mg total) by mouth daily. For mood stabilization (Patient not taking: Reported on 06/28/2015) 30 tablet 0  . meloxicam (MOBIC) 7.5 MG tablet Take 1 tablet (7.5 mg total) by mouth daily. (Patient not taking: Reported on 06/28/2015) 30 tablet 0    Musculoskeletal: Strength & Muscle Tone: within normal limits Gait & Station: normal Patient leans: N/A  Psychiatric Specialty Exam: Review of Systems  Constitutional: Negative.   HENT: Negative.   Eyes: Negative.   Respiratory: Negative.   Cardiovascular: Negative.   Gastrointestinal: Negative.   Genitourinary: Negative.   Musculoskeletal: Negative.   Skin: Negative.   Neurological: Negative.   Endo/Heme/Allergies: Negative.   Psychiatric/Behavioral: Positive for depression.    Blood pressure 113/79, pulse 75, temperature 97.7 F (36.5 C), temperature source Oral, resp. rate 17, height '5\' 6"'  (1.676 m), weight 100.699 kg (222 lb), SpO2 99 %.Body mass index is 35.85 kg/(m^2).  General Appearance: Casual  Eye Contact::  Good  Speech:  Normal Rate  Volume:  Normal  Mood:  Depressed  Affect:  Congruent  Thought Process:  Coherent  Orientation:  Full (Time, Place, and Person)  Thought Content:  Rumination  Suicidal Thoughts:  No  Homicidal Thoughts:  No  Memory:  Immediate;   Good Recent;   Good Remote;   Good  Judgement:  Good  Insight:  Fair  Psychomotor Activity:  Normal  Concentration:  Good  Recall:  Good  Fund of Knowledge:Good  Language: Good  Akathisia:  No  Handed:  Right  AIMS (if indicated):     Assets:  Housing Leisure Time Physical Health Resilience Social Support  ADL's:  Intact  Cognition: WNL  Sleep:      Treatment Plan Summary: Daily contact with patient to assess and  evaluate symptoms and progress in treatment, Medication management and Plan bipolar affective disorder, depressed: -Crisis stabilization -Individual counseling -Resources for outpatient therapy  Disposition: No evidence of imminent risk to self or others at present.    Waylan Boga, PMh-NP 06/28/2015 10:18 AM Patient seen face-to-face for psychiatric evaluation, chart reviewed and case discussed with the physician extender and developed treatment plan. Reviewed the information documented and agree with the treatment plan. Corena Pilgrim, MD

## 2015-06-28 NOTE — ED Notes (Signed)
Pt. Noted sleeping in room. No complaints or concerns voiced. No distress or abnormal behavior noted. Will continue to monitor with security cameras. Q 15 minute rounds continue. 

## 2015-06-28 NOTE — BH Assessment (Signed)
[place] Assessment Progress Note  Per Corena Pilgrim, MD, this pt does not require psychiatric hospitalization at this time.  She is to be discharged from [facility] with recommendation to follow up with [facility], her current outpatient provider.  This has been included in pt's discharge instructions.  Pt's nurse has been notified.  Jalene Mullet, [place] Triage Specialist [phone]

## 2015-06-28 NOTE — BH Assessment (Signed)
Assessment completed. Consulted Arlester Marker, NP who recommended inpatient treatment for stabilization. Informed Charlann Lange, PA-C of recommendation.

## 2015-06-28 NOTE — BH Assessment (Addendum)
Tele Assessment Note   [name] is an 44 y.o. female Presenting to [facility] due to increasing depression. PT stated "I am depressed and very sick". "I have been taking my Depokate and Lithium consistently but I have been off of my Cognetin for 15 or 20 days". "I can't remember if I ran out or lost them". "I am just tired".  Pt denies SI, HI and AVH at this time. Pt reported that she has attempted suicide multiple times in the past and has been hospitalized several times. Pt reported that her most recent hospitalization was at [facility] in August 2016.  Pt shared that she is currently receiving medication management through [facility]. Pt is endorsing multiple depressive symptoms and shared that her sleep and appetite have been poor the past several weeks. Pt reported that she is currently in school and she is having difficulty concentrating. Pt denied having access to weapons or firearms. Pt denied any upcoming court dates or pending criminal charges. Pt did not report any alcohol or illicit substance abuse.  Inpatient treatment is recommended for psychiatric stabilization.   Diagnosis: Schizoaffective disorder, bipolar type   Past Medical History:  Past Medical History  Diagnosis Date  . Anemia   . Asthma   . Hypertension   . Schizophrenic disorder ([place])   . Seizures ([place])   . Anxiety   . Depression   . Insomnia, persistent   . Bipolar 1 disorder [facility])     Past Surgical History  Procedure Laterality Date  . Tonsillectomy      Family History:  Family History  Problem Relation Age of Onset  . Depression Mother   . Gout Mother     Social History:  reports that she has quit smoking. She has never used smokeless tobacco. She reports that she does not drink alcohol or use illicit drugs.  Additional Social History:  Alcohol / Drug Use History of alcohol / drug use?: No history of alcohol / drug abuse  CIWA: CIWA-Ar BP: 120/84 mmHg Pulse Rate: 69 COWS:    PATIENT STRENGTHS: (choose at  least two) Average or above average intelligence Motivation for treatment/growth  Allergies:  Allergies  Allergen Reactions  . Haldol [Haloperidol Decanoate] Other (See Comments)    Stiffness, eyes bulging  . Penicillins Nausea And Vomiting    Has patient had a PCN reaction causing immediate rash, facial/tongue/throat swelling, SOB or lightheadedness with hypotension:UNSURE  Has patient had a PCN reaction causing severe rash involving mucus membranes or skin necrosis: UNSURE Has patient had a PCN reaction that required hospitalization:UNSURE Has patient had a PCN reaction occurring within the last 10 years:No If all of the above answers are "NO", then may proceed with Cephalosporin use. CHILDHOOD REACTION  . Shrimp [Shellfish Allergy] Rash    Home Medications:  (Not in a hospital admission)  OB/GYN Status:  No LMP recorded.  General Assessment Data Location of Assessment: [facility] ED TTS Assessment: In system Is this a Tele or Face-to-Face Assessment?: Face-to-Face Is this an Initial Assessment or a Re-assessment for this encounter?: Initial Assessment Marital status: Single Living Arrangements: Other relatives Can pt return to current living arrangement?: Yes Admission Status: Voluntary Is patient capable of signing voluntary admission?: Yes Referral Source: Self/Family/Friend Insurance type: Medicare      Crisis Care Plan Living Arrangements: Other relatives Name of Psychiatrist: Beverly Sessions  Name of Therapist: No provider reported at this time.   Education Status Is patient currently in school?: Yes Current Grade: N/A Highest grade of school  patient has completed: N/A Name of school: [employer] person: N/A  Risk to self with the past 6 months Suicidal Ideation: No Has patient been a risk to self within the past 6 months prior to admission? : No Suicidal Intent: No Has patient had any suicidal intent within the past 6 months prior to admission? : No Is  patient at risk for suicide?: No Suicidal Plan?: No Has patient had any suicidal plan within the past 6 months prior to admission? : No Access to Means: No What has been your use of drugs/alcohol within the last 12 months?: No alcohol or drug use reported.  Previous Attempts/Gestures: Yes How many times?: 3 Other Self Harm Risks: No other self harm risk identified at this time.  Triggers for Past Attempts: Unpredictable Intentional Self Injurious Behavior: None Family Suicide History: Yes (Uncle ) Recent stressful life event(s): Financial Problems, Conflict (Comment) (Relationship stressors ) Persecutory voices/beliefs?: No Depression: Yes Depression Symptoms: Isolating, Tearfulness, Despondent, Insomnia, Fatigue, Loss of interest in usual pleasures, Feeling worthless/self pity, Guilt, Feeling angry/irritable Substance abuse history and/or treatment for substance abuse?: No Suicide prevention information given to non-admitted patients: Not applicable  Risk to Others within the past 6 months Homicidal Ideation: No Does patient have any lifetime risk of violence toward others beyond the six months prior to admission? : No Thoughts of Harm to Others: No Current Homicidal Intent: No Current Homicidal Plan: No Access to Homicidal Means: No Identified Victim: N/A History of harm to others?: No Assessment of Violence: On admission Violent Behavior Description: No violent behaviors observed.  Does patient have access to weapons?: No Criminal Charges Pending?: No Does patient have a court date: No Is patient on probation?: No  Psychosis Hallucinations: None noted Delusions: None noted  Mental Status Report Appearance/Hygiene: In scrubs Eye Contact: Fair Motor Activity: Freedom of movement Speech: Logical/coherent, Soft Level of Consciousness: Quiet/awake Mood: Depressed, Sad Affect: Blunted Anxiety Level: Minimal Thought Processes: Coherent, Relevant Judgement:  Unimpaired Orientation: Appropriate for developmental age Obsessive Compulsive Thoughts/Behaviors: None  Cognitive Functioning Concentration: Decreased Memory: Remote Intact, Recent Intact IQ: Average Insight: Fair Impulse Control: Good Appetite: Poor Weight Loss: 0 Weight Gain: 0 Sleep: Decreased Total Hours of Sleep: 2 Vegetative Symptoms: Staying in bed, Decreased grooming  ADLScreening [name] Assessment Services) [name] cognitive ability adequate to safely complete daily activities?: Yes Patient able to express need for assistance with ADLs?: Yes Independently performs ADLs?: Yes (appropriate for developmental age)  Prior Inpatient Therapy Prior Inpatient Therapy: Yes Prior Therapy Dates: 2016, 2014, 2013, 2012, 2010 Prior Therapy Facilty/Provider(s): [facility]  Reason for Treatment: Depression/Suicidal ideations   Prior Outpatient Therapy Prior Outpatient Therapy: Yes Prior Therapy Dates: Current  Prior Therapy Facilty/Provider(s): [facility] Reason for Treatment: Medication management  Does patient have an ACCT team?: No Does patient have Intensive In-House Services?  : No Does patient have [facility] services? : Yes Does patient have P4CC services?: No  ADL Screening (condition at time of admission) [name] cognitive ability adequate to safely complete daily activities?: Yes Is the patient deaf or have difficulty hearing?: No Does the patient have difficulty seeing, even when wearing glasses/contacts?: No Does the patient have difficulty concentrating, remembering, or making decisions?: No Patient able to express need for assistance with ADLs?: Yes Does the patient have difficulty dressing or bathing?: No Independently performs ADLs?: Yes (appropriate for developmental age)       Abuse/Neglect Assessment (Assessment to be complete while patient is alone) Physical Abuse: Denies Verbal Abuse:  Denies Sexual Abuse: Denies Exploitation of patient/patient's  resources: Denies Self-Neglect: Denies     Regulatory affairs officer (For Healthcare) Does patient have an advance directive?: No Would patient like information on creating an advanced directive?: No - patient declined information    Additional Information 1:1 In Past 12 Months?: No CIRT Risk: No Elopement Risk: No Does patient have medical clearance?: Yes     Disposition:  Disposition Initial Assessment Completed for this Encounter: Yes Disposition of Patient: Inpatient treatment program Type of inpatient treatment program: Adult  Caprisha Bridgett S 06/28/2015 3:44 AM

## 2015-06-28 NOTE — ED Notes (Signed)
Writer reviewed discharged instructions with patient, and gave pt discharge paperwork. Pt received all personal belongings. Pt given bus pass, and leaving with self. Pt denies SI/HI/AVH.

## 2015-06-28 NOTE — ED Notes (Signed)
TTS at bedside. 

## 2015-06-28 NOTE — ED Notes (Signed)
Pt. To [facility] from ED ambulatory without difficulty, to room 37 . Report from [facility]. Pt. Is alert and oriented, warm and dry in no distress. Pt. Denies SI, HI, and AVH. Pt. Calm and cooperative. Pt. Made aware of security cameras and Q15 minute rounds. Pt. Encouraged to let Nursing staff know of any concerns or needs.

## 2015-06-28 NOTE — ED Provider Notes (Signed)
CSN: [id number]     Arrival date & time 06/27/15  2359 History  By signing my name below, I, Eustaquio Maize, attest that this documentation has been prepared under the direction and in the presence of [name], MD at [phone]. Electronically Signed: Eustaquio Maize, ED Scribe. 06/28/2015. 2:44 AM.  Chief Complaint  Patient presents with  . Depression   The history is provided by the patient. No language interpreter was used.     HPI Comments: [name] is a 44 y.o. female brought in by ambulance, with hx schizophrenic disorder, anxiety, depression, and bipolar disorder who presents to the Emergency Department complaining of "feeling terrible" and worsening depression that began tonight. She reports that she was talking to a friend tonight who told her she sounds very depressed. Pt admits to thoughts of suicide recently but does not have any plans. She states that everyone tells her to continue to take her medication but she reports feeling tired of taking it. She states she is currently at a "low." Pt has had multiple previous admits to psychiatric hospitals. The last time she was admitted was in August 2016 (approximately 3 months ago) at [facility]. Pt is currently complaining of a headache, generalized body aches, and fatigue. She mentions that she has been staying in bed for prolonged periods of time recently. Pt is attending [employer] but hasn't been to campus in 2 weeks because she hasn't felt up to it. Pt states she has midterms soon. Denies homicidal ideation, hallucinations, or any other associated symptoms.   Psychiatrist - Beverly Sessions  Past Medical History  Diagnosis Date  . Anemia   . Asthma   . Hypertension   . Schizophrenic disorder ([place])   . Seizures ([place])   . Anxiety   . Depression   . Insomnia, persistent   . Bipolar 1 disorder [facility])    Past Surgical History  Procedure Laterality Date  . Tonsillectomy     Family History  Problem Relation Age of  Onset  . Depression Mother   . Gout Mother    Social History  Substance Use Topics  . Smoking status: Former Research scientist (life sciences)  . Smokeless tobacco: Never Used  . Alcohol Use: No   Employed student  OB History    No data available     Review of Systems  Constitutional: Positive for fatigue.  Musculoskeletal: Positive for myalgias.  Neurological: Positive for headaches.  Psychiatric/Behavioral: Positive for suicidal ideas. Negative for hallucinations and self-injury.  All other systems reviewed and are negative.  Allergies  Haldol; Penicillins; and Shrimp  Home Medications   Prior to Admission medications   Medication Sig Start Date End Date Taking? Authorizing Provider  benztropine (COGENTIN) 0.5 MG tablet Take 1 tablet (0.5 mg total) by mouth at bedtime. For drug induced extrapyramidal reaction 01/20/15  Yes Shuvon B Rankin, NP  divalproex (DEPAKOTE) 500 MG DR tablet Take 1,000 mg by mouth at bedtime.   Yes Historical Provider, MD  lithium carbonate 300 MG capsule Take 300 mg by mouth 2 (two) times daily.   Yes Historical Provider, MD  traZODone (DESYREL) 150 MG tablet Take 1 tablet (150 mg total) by mouth at bedtime as needed for sleep. 01/20/15  Yes Shuvon B Rankin, NP  ARIPiprazole (ABILIFY) 30 MG tablet Take 1 tablet (30 mg total) by mouth at bedtime. Patient not taking: Reported on 04/23/2015 01/20/15   Shuvon B Rankin, NP  ARIPiprazole 400 MG SUSR Inject 400 mg into the muscle every 28 (  twenty-eight) days. For Schizophrenia Patient not taking: Reported on 06/28/2015 01/20/15   Shuvon B Rankin, NP  budesonide-formoterol (SYMBICORT) 160-4.5 MCG/ACT inhaler Inhale 2 puffs into the lungs 2 (two) times daily. For Asthma Patient not taking: Reported on 04/23/2015 01/20/15   Shuvon B Rankin, NP  divalproex (DEPAKOTE ER) 500 MG 24 hr tablet Take 2 tablets (1,000 mg total) by mouth at bedtime. For mood stabilization Patient not taking: Reported on 06/28/2015 01/20/15   Shuvon B Rankin, NP   hydrOXYzine (ATARAX/VISTARIL) 25 MG tablet Take 1 tablet (25 mg total) by mouth 3 (three) times daily as needed for itching or anxiety (sleep). Patient not taking: Reported on 04/23/2015 01/20/15   Shuvon B Rankin, NP  lithium carbonate (LITHOBID) 300 MG CR tablet Take 1 tablet (300 mg total) by mouth daily. For mood stabilization Patient not taking: Reported on 06/28/2015 01/20/15   Shuvon B Rankin, NP  meloxicam (MOBIC) 7.5 MG tablet Take 1 tablet (7.5 mg total) by mouth daily. Patient not taking: Reported on 06/28/2015 01/20/15   Shuvon B Rankin, NP   Triage Vitals: BP 120/84 mmHg  Pulse 69  Temp(Src) 98.3 F (36.8 C) (Oral)  Resp 18  Ht 5\' 6"  (1.676 m)  Wt 222 lb (100.699 kg)  BMI 35.85 kg/m2  SpO2 100%  LMP    Vital signs normal    Physical Exam  Constitutional: She is oriented to person, place, and time. She appears well-developed and well-nourished.  Non-toxic appearance. She does not appear ill. No distress.  HENT:  Head: Normocephalic and atraumatic.  Right Ear: External ear normal.  Left Ear: External ear normal.  Nose: Nose normal. No mucosal edema or rhinorrhea.  Mouth/Throat: Oropharynx is clear and moist and mucous membranes are normal. No dental abscesses or uvula swelling.  Eyes: Conjunctivae and EOM are normal. Pupils are equal, round, and reactive to light.  Neck: Normal range of motion and full passive range of motion without pain. Neck supple.  Cardiovascular: Normal rate, regular rhythm and normal heart sounds.  Exam reveals no gallop and no friction rub.   No murmur heard. Pulmonary/Chest: Effort normal and breath sounds normal. No respiratory distress. She has no wheezes. She has no rhonchi. She has no rales. She exhibits no tenderness and no crepitus.  Abdominal: Soft. Normal appearance and bowel sounds are normal. She exhibits no distension. There is no tenderness. There is no rebound and no guarding.  Musculoskeletal: Normal range of motion. She exhibits no  edema or tenderness.  Moves all extremities well.   Neurological: She is alert and oriented to person, place, and time. She has normal strength. No cranial nerve deficit.  Skin: Skin is warm, dry and intact. No rash noted. No erythema. No pallor.  Psychiatric: She has a normal mood and affect. Her speech is normal and behavior is normal. Her mood appears not anxious.  Nursing note and vitals reviewed.   ED Course  Procedures (including critical care time)  Medications  LORazepam (ATIVAN) tablet 1 mg (not administered)  acetaminophen (TYLENOL) tablet 650 mg (not administered)  ibuprofen (ADVIL,MOTRIN) tablet 600 mg (not administered)  zolpidem (AMBIEN) tablet 10 mg (not administered)  nicotine (NICODERM CQ - dosed in mg/24 hours) patch 21 mg (not administered)  ondansetron (ZOFRAN) tablet 4 mg (not administered)  alum & mag hydroxide-simeth (MAALOX/MYLANTA) 200-200-20 MG/5ML suspension 30 mL (not administered)     DIAGNOSTIC STUDIES: Oxygen Saturation is 100% on RA, normal by my interpretation.    COORDINATION OF CARE: 2:44 AM-Discussed  treatment plan which includes Lithium level and consult with TTS with pt at bedside and pt agreed to plan. Pt put on Psych hold.  4 am [place], TSS has evaluated patient and states she meets inpatient criteria for admission.    Labs Review Results for orders placed or performed during the hospital encounter of 06/28/15  Comprehensive metabolic panel  Result Value Ref Range   Sodium 142 135 - 145 mmol/L   Potassium 3.8 3.5 - 5.1 mmol/L   Chloride 107 101 - 111 mmol/L   CO2 28 22 - 32 mmol/L   Glucose, Bld 102 (H) 65 - 99 mg/dL   BUN 16 6 - 20 mg/dL   Creatinine, Ser 0.82 0.44 - 1.00 mg/dL   Calcium 9.3 8.9 - 10.3 mg/dL   Total Protein 7.6 6.5 - 8.1 g/dL   Albumin 4.2 3.5 - 5.0 g/dL   AST 13 (L) 15 - 41 U/L   ALT 12 (L) 14 - 54 U/L   Alkaline Phosphatase 46 38 - 126 U/L   Total Bilirubin 0.5 0.3 - 1.2 mg/dL   GFR calc non Af Amer >60 >60  mL/min   GFR calc Af Amer >60 >60 mL/min   Anion gap 7 5 - 15  Ethanol (ETOH)  Result Value Ref Range   Alcohol, Ethyl (B) <5 <5 mg/dL  Salicylate level  Result Value Ref Range   Salicylate Lvl <0.6 2.8 - 30.0 mg/dL  Acetaminophen level  Result Value Ref Range   Acetaminophen (Tylenol), Serum <10 (L) 10 - 30 ug/mL  CBC  Result Value Ref Range   WBC 6.6 4.0 - 10.5 K/uL   RBC 4.05 3.87 - 5.11 MIL/uL   Hemoglobin 12.1 12.0 - 15.0 g/dL   HCT 37.4 36.0 - 46.0 %   MCV 92.3 78.0 - 100.0 fL   MCH 29.9 26.0 - 34.0 pg   MCHC 32.4 30.0 - 36.0 g/dL   RDW 13.1 11.5 - 15.5 %   Platelets 242 150 - 400 K/uL  Urine rapid drug screen (hosp performed) (Not at [facility])  Result Value Ref Range   Opiates NONE DETECTED NONE DETECTED   Cocaine NONE DETECTED NONE DETECTED   Benzodiazepines NONE DETECTED NONE DETECTED   Amphetamines NONE DETECTED NONE DETECTED   Tetrahydrocannabinol NONE DETECTED NONE DETECTED   Barbiturates NONE DETECTED NONE DETECTED  Lithium level  Result Value Ref Range   Lithium Lvl <0.06 (L) 0.60 - 1.20 mmol/L  POC urine preg, ED (not at [facility])  Result Value Ref Range   Preg Test, Ur NEGATIVE NEGATIVE   Laboratory interpretation all normal     MDM   Final diagnoses:  Depression   Plan inpatient psychiatric admission  [name], MD, FACEP    I personally performed the services described in this documentation, which was scribed in my presence. The recorded information has been reviewed and considered.  [name], MD, Barbette Or, MD 06/28/15 [phone]

## 2015-06-28 NOTE — [facility] Suicide Risk Assessment (Signed)
Suicide Risk Assessment  Discharge Assessment   [facility] Discharge Suicide Risk Assessment   Demographic Factors:  NA  Total Time spent with patient: 45 minutes  Musculoskeletal: Strength & Muscle Tone: within normal limits Gait & Station: normal Patient leans: N/A  Psychiatric Specialty Exam: Review of Systems  Constitutional: Negative.   HENT: Negative.   Eyes: Negative.   Respiratory: Negative.   Cardiovascular: Negative.   Gastrointestinal: Negative.   Genitourinary: Negative.   Musculoskeletal: Negative.   Skin: Negative.   Neurological: Negative.   Endo/Heme/Allergies: Negative.   Psychiatric/Behavioral: Positive for depression.    Blood pressure 113/79, pulse 75, temperature 97.7 F (36.5 C), temperature source Oral, resp. rate 17, height 5\' 6"  (1.676 m), weight 100.699 kg (222 lb), SpO2 99 %.Body mass index is 35.85 kg/(m^2).  General Appearance: Casual  Eye Contact::  Good  Speech:  Normal Rate  Volume:  Normal  Mood:  Depressed  Affect:  Congruent  Thought Process:  Coherent  Orientation:  Full (Time, Place, and Person)  Thought Content:  Rumination  Suicidal Thoughts:  No  Homicidal Thoughts:  No  Memory:  Immediate;   Good Recent;   Good Remote;   Good  Judgement:  Good  Insight:  Fair  Psychomotor Activity:  Normal  Concentration:  Good  Recall:  Good  Fund of Knowledge:Good  Language: Good  Akathisia:  No  Handed:  Right  AIMS (if indicated):     Assets:  Housing Leisure Time Physical Health Resilience Social Support  ADL's:  Intact  Cognition: WNL  Sleep:      Has this patient used any form of tobacco in the last 30 days? (Cigarettes, Smokeless Tobacco, Cigars, and/or Pipes) No  Mental Status Per Nursing Assessment::   On Admission:   Depression  Current Mental Status by Physician: NA  Loss Factors: NA  Historical Factors: NA  Risk Reduction Factors:   Sense of responsibility to family, Employed, Living with another person,  especially a relative and Positive social support  Continued Clinical Symptoms:  Depression, mild  Cognitive Features That Contribute To Risk:  None    Suicide Risk:  Minimal: No identifiable suicidal ideation.  Patients presenting with no risk factors but with morbid ruminations; may be classified as minimal risk based on the severity of the depressive symptoms  Principal Problem: Schizoaffective disorder, bipolar type [facility]) Discharge Diagnoses:  Patient Active Problem List   Diagnosis Date Noted  . Schizoaffective disorder, bipolar type ([place]) [F25.0] 01/11/2015    Priority: High      Plan Of Care/Follow-up recommendations:  Activity:  as tolerated Diet:  heart healthy diet  Is patient on multiple antipsychotic therapies at discharge:  No   Has Patient had three or more failed trials of antipsychotic monotherapy by history:  No  Recommended Plan for Multiple Antipsychotic Therapies: NA    [name], PMH-NP 06/28/2015, 10:42 AM

## 2015-06-28 NOTE — ED Notes (Addendum)
Pt was brought in by [facility] for depression. Pt is currently taking Depakote and Lithium for depression. Recently started back taking Lithium. Denies SI/HI.

## 2015-07-05 ENCOUNTER — Emergency Department (HOSPITAL_COMMUNITY)
Admission: EM | Admit: 2015-07-05 | Discharge: 2015-07-05 | Disposition: A | Discharge status: Home / Self Care | Payer: Medicare Other | Attending: Emergency Medicine | Admitting: Emergency Medicine

## 2015-07-05 ENCOUNTER — Encounter (HOSPITAL_COMMUNITY): Payer: Self-pay | Admitting: *Deleted

## 2015-07-05 DIAGNOSIS — J45909 Unspecified asthma, uncomplicated: Secondary | ICD-10-CM | POA: Diagnosis not present

## 2015-07-05 DIAGNOSIS — F419 Anxiety disorder, unspecified: Secondary | ICD-10-CM | POA: Diagnosis not present

## 2015-07-05 DIAGNOSIS — I10 Essential (primary) hypertension: Secondary | ICD-10-CM | POA: Diagnosis not present

## 2015-07-05 DIAGNOSIS — Z79899 Other long term (current) drug therapy: Secondary | ICD-10-CM | POA: Insufficient documentation

## 2015-07-05 DIAGNOSIS — F329 Major depressive disorder, single episode, unspecified: Secondary | ICD-10-CM | POA: Diagnosis present

## 2015-07-05 DIAGNOSIS — Z7951 Long term (current) use of inhaled steroids: Secondary | ICD-10-CM | POA: Insufficient documentation

## 2015-07-05 DIAGNOSIS — G40909 Epilepsy, unspecified, not intractable, without status epilepticus: Secondary | ICD-10-CM | POA: Insufficient documentation

## 2015-07-05 DIAGNOSIS — F259 Schizoaffective disorder, unspecified: Secondary | ICD-10-CM

## 2015-07-05 DIAGNOSIS — Z880 Allergy status to penicillin: Secondary | ICD-10-CM | POA: Diagnosis not present

## 2015-07-05 DIAGNOSIS — F250 Schizoaffective disorder, bipolar type: Secondary | ICD-10-CM | POA: Diagnosis not present

## 2015-07-05 DIAGNOSIS — Z87891 Personal history of nicotine dependence: Secondary | ICD-10-CM | POA: Diagnosis not present

## 2015-07-05 DIAGNOSIS — E119 Type 2 diabetes mellitus without complications: Secondary | ICD-10-CM | POA: Insufficient documentation

## 2015-07-05 HISTORY — DX: Type 2 diabetes mellitus without complications: E11.9

## 2015-07-05 LAB — BASIC METABOLIC PANEL
Anion gap: 7 (ref 5–15)
BUN: 12 mg/dL (ref 6–20)
CALCIUM: 9.8 mg/dL (ref 8.9–10.3)
CO2: 27 mmol/L (ref 22–32)
CREATININE: 0.78 mg/dL (ref 0.44–1.00)
Chloride: 106 mmol/L (ref 101–111)
GFR calc non Af Amer: >60 mL/min (ref >60)
Glucose, Bld: 107 mg/dL — ABNORMAL HIGH (ref 65–99)
Potassium: 4.3 mmol/L (ref 3.5–5.1)
SODIUM: 140 mmol/L (ref 135–145)

## 2015-07-05 LAB — CBC WITH DIFFERENTIAL/PLATELET
BASOS PCT: 0 %
Basophils Absolute: 0 10*3/uL (ref 0.0–0.1)
EOS ABS: 0.3 10*3/uL (ref 0.0–0.7)
Eosinophils Relative: 6 %
HCT: 36 % (ref 36.0–46.0)
HEMOGLOBIN: 11.8 g/dL — AB (ref 12.0–15.0)
Lymphocytes Relative: 45 %
Lymphs Abs: 2.5 10*3/uL (ref 0.7–4.0)
MCH: 29.9 pg (ref 26.0–34.0)
MCHC: 32.8 g/dL (ref 30.0–36.0)
MCV: 91.1 fL (ref 78.0–100.0)
MONOS PCT: 5 %
Monocytes Absolute: 0.3 10*3/uL (ref 0.1–1.0)
NEUTROS PCT: 44 %
Neutro Abs: 2.4 10*3/uL (ref 1.7–7.7)
PLATELETS: 240 10*3/uL (ref 150–400)
RBC: 3.95 MIL/uL (ref 3.87–5.11)
RDW: 13 % (ref 11.5–15.5)
WBC: 5.6 10*3/uL (ref 4.0–10.5)

## 2015-07-05 LAB — RAPID URINE DRUG SCREEN, HOSP PERFORMED
AMPHETAMINES: NOT DETECTED
Barbiturates: NOT DETECTED
Benzodiazepines: NOT DETECTED
Cocaine: NOT DETECTED
OPIATES: NOT DETECTED
Tetrahydrocannabinol: NOT DETECTED

## 2015-07-05 LAB — ETHANOL

## 2015-07-05 MED ORDER — ACETAMINOPHEN 325 MG PO TABS: 650.0000 mg | ORAL_TABLET | ORAL | Status: DC | PRN

## 2015-07-05 NOTE — [facility] Suicide Risk Assessment (Signed)
Suicide Risk Assessment  Discharge Assessment   [facility] Discharge Suicide Risk Assessment   Demographic Factors:  NA  Total Time spent with patient: 45 minutes  Musculoskeletal: Strength & Muscle Tone: within normal limits Gait & Station: normal Patient leans: N/A  Psychiatric Specialty Exam: Review of Systems  Constitutional: Negative.   HENT: Negative.   Eyes: Negative.   Respiratory: Negative.   Cardiovascular: Negative.   Gastrointestinal: Negative.   Genitourinary: Negative.   Musculoskeletal: Negative.   Skin: Negative.   Neurological: Negative.   Endo/Heme/Allergies: Negative.   Psychiatric/Behavioral:       Negative    Blood pressure 101/56, pulse 66, temperature 97.8 F (36.6 C), temperature source Oral, resp. rate 18, last menstrual period 06/24/2015, SpO2 100 %.There is no weight on file to calculate BMI.  General Appearance: Casual  Eye Contact::  Good  Speech:  Normal Rate  Volume:  Normal  Mood:  Anxious  Affect:  Congruent  Thought Process:  Coherent  Orientation:  Full (Time, Place, and Person)  Thought Content:  WDL  Suicidal Thoughts:  No  Homicidal Thoughts:  No  Memory:  Immediate;   Good Recent;   Good Remote;   Good  Judgement:  Fair  Insight:  Fair  Psychomotor Activity:  Normal  Concentration:  Good  Recall:  Good  Fund of Knowledge:Good  Language: Good  Akathisia:  No  Handed:  Right  AIMS (if indicated):     Assets:  Housing Leisure Time Physical Health Resilience Social Support  ADL's:  Intact  Cognition: WNL  Sleep:         Has this patient used any form of tobacco in the last 30 days? (Cigarettes, Smokeless Tobacco, Cigars, and/or Pipes) No  Mental Status Per Nursing Assessment::   On Admission:   disorganized behaviors  Current Mental Status by Physician: NA  Loss Factors: NA  Historical Factors: NA  Risk Reduction Factors:   Sense of responsibility to family, Employed, Living with another person, especially a  relative, Positive social support and Positive therapeutic relationship  Continued Clinical Symptoms:  Anxiety, mild  Cognitive Features That Contribute To Risk:  None    Suicide Risk:  Minimal: No identifiable suicidal ideation.  Patients presenting with no risk factors but with morbid ruminations; may be classified as minimal risk based on the severity of the depressive symptoms  Principal Problem: <principal problem not specified> Discharge Diagnoses:  Patient Active Problem List   Diagnosis Date Noted  . Schizoaffective disorder, bipolar type ([place]) [F25.0] 01/11/2015    Priority: High      Plan Of Care/Follow-up recommendations:  Activity:  as tolerated Diet:  heart healthy diet  Is patient on multiple antipsychotic therapies at discharge:  No   Has Patient had three or more failed trials of antipsychotic monotherapy by history:  No  Recommended Plan for Multiple Antipsychotic Therapies: NA    LORD, JAMISON, PMH-NP 07/05/2015, 11:02 AM
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
## 2015-07-05 NOTE — BH Assessment (Signed)
[place] Assessment Progress Note  Per Corena Pilgrim, MD this pt does not require psychiatric hospitalization at this time.  She is to be discharged from [facility] with recommendation to follow up with the [facility] Team.  Pt has signed Consent to Release Information to [facility], and a notification call has been placed.  Discharge instructions advise pt to continue treatment with the [facility] ACT Team.  Pt's nurse, Jan, has been notified.  Jalene Mullet, [place] Triage Specialist [phone]

## 2015-07-05 NOTE — ED Notes (Signed)
Pt d/c from the hospital. All items returned. Pt denies si and hi.

## 2015-07-05 NOTE — ED Notes (Signed)
Pt. To [facility] from ED ambulatory without difficulty, to room 34 . Report from [employer]. Pt. Is alert and oriented, warm and dry in no distress. Pt. Denies SI, HI, and AVH. Pt. Calm and cooperative. Pt. Made aware of security cameras and Q15 minute rounds. Pt. Encouraged to let Nursing staff know of any concerns or needs.

## 2015-07-05 NOTE — Consult Note (Signed)
[facility] Face-to-Face Psychiatry Consult   Reason for Consult:  Disorganized behaviors Referring Physician:  EDP Patient Identification: Meridee Branum MRN:  [id number] Principal Diagnosis: Schizoaffective disorder, bipolar type Diagnosis:   Patient Active Problem List   Diagnosis Date Noted  . Schizoaffective disorder, bipolar type ([place]) [F25.0] 01/11/2015    Priority: High    Total Time spent with patient: 45 minutes  Subjective:   Laira Penninger is a 44 y.o. female patient does not warrant admission.  HPI:  44 yo female presented to the ED with disorganized behaviors, history of schizoaffective disorder.  Denies suicidal/homicidal ideations, hallucinations, and alcohol/drug abuse.  She is currently connected with [name] ACT Team who saw her yesterday.  Pattye reports their visit prompted her to become upset, triggered her and she came to the ED.  She wants to leave and feels stable, ACT team will be notified.  No longer disorganized, stable for discharge.  Past Psychiatric History: Schizoaffective disorder  Risk to Self: Suicidal Ideation: No Suicidal Intent: No-Not Currently/Within Last 6 Months Is patient at risk for suicide?: No Suicidal Plan?: No Access to Means: No What has been your use of drugs/alcohol within the last 12 months?: No alcohol or drug use reported.  How many times?: 3 Other Self Harm Risks: No other self harm risk identified  Triggers for Past Attempts: Unpredictable Intentional Self Injurious Behavior: None Risk to Others: Homicidal Ideation: No Thoughts of Harm to Others: No Current Homicidal Intent: No Current Homicidal Plan: No Access to Homicidal Means: No Identified Victim: N/A History of harm to others?: No Assessment of Violence: None Noted Violent Behavior Description: No violent behaviors observed. Pt is calm and cooperative at this time.  Does patient have access to weapons?: No Criminal Charges Pending?: No Does patient have a court date:  No Prior Inpatient Therapy: Prior Inpatient Therapy: Yes Prior Therapy Dates: 2016, 2014, 2013, 2012, 2010 Prior Therapy Facilty/Provider(s): [facility]  Reason for Treatment: Depression/Suicidal ideations  Prior Outpatient Therapy: Prior Outpatient Therapy: Yes Prior Therapy Dates: Current  Prior Therapy Facilty/Provider(s): [facility] Reason for Treatment: Medication management  Does patient have an ACCT team?: Unknown Does patient have Intensive In-House Services?  : No Does patient have [facility] services? : No Does patient have P4CC services?: No  Past Medical History:  Past Medical History  Diagnosis Date  . Anemia   . Asthma   . Hypertension   . Schizophrenic disorder ([place])   . Seizures ([place])   . Anxiety   . Depression   . Insomnia, persistent   . Bipolar 1 disorder ([place])   . Diabetes mellitus without complication [facility])     Past Surgical History  Procedure Laterality Date  . Tonsillectomy     Family History:  Family History  Problem Relation Age of Onset  . Depression Mother   . Gout Mother    Family Psychiatric  History: Unknown Social History:  History  Alcohol Use No     History  Drug Use No    Social History   Social History  . Marital Status: Single    Spouse Name: N/A  . Number of Children: N/A  . Years of Education: N/A   Social History Main Topics  . Smoking status: Former Research scientist (life sciences)  . Smokeless tobacco: Never Used  . Alcohol Use: No  . Drug Use: No  . Sexual Activity: Not Asked   Other Topics Concern  . None   Social History Narrative   Additional Social History:    History  of alcohol / drug use?: No history of alcohol / drug abuse                     Allergies:   Allergies  Allergen Reactions  . Haldol [Haloperidol Decanoate] Other (See Comments)    Stiffness, eyes bulging  . Penicillins Nausea And Vomiting    Has patient had a PCN reaction causing immediate rash, facial/tongue/throat swelling, SOB or lightheadedness  with hypotension:UNSURE  Has patient had a PCN reaction causing severe rash involving mucus membranes or skin necrosis: UNSURE Has patient had a PCN reaction that required hospitalization:UNSURE Has patient had a PCN reaction occurring within the last 10 years:No If all of the above answers are "NO", then may proceed with Cephalosporin use. CHILDHOOD REACTION  . Shrimp [Shellfish Allergy] Rash    Labs:  Results for orders placed or performed during the hospital encounter of 07/05/15 (from the past 48 hour(s))  Basic metabolic panel     Status: Abnormal   Collection Time: 07/05/15  5:04 AM  Result Value Ref Range   Sodium 140 135 - 145 mmol/L   Potassium 4.3 3.5 - 5.1 mmol/L   Chloride 106 101 - 111 mmol/L   CO2 27 22 - 32 mmol/L   Glucose, Bld 107 (H) 65 - 99 mg/dL   BUN 12 6 - 20 mg/dL   Creatinine, Ser 0.78 0.44 - 1.00 mg/dL   Calcium 9.8 8.9 - 10.3 mg/dL   GFR calc non Af Amer >60 >60 mL/min   GFR calc Af Amer >60 >60 mL/min    Comment: (NOTE) The eGFR has been calculated using the CKD EPI equation. This calculation has not been validated in all clinical situations. eGFR's persistently <60 mL/min signify possible Chronic Kidney Disease.    Anion gap 7 5 - 15  CBC with Differential     Status: Abnormal   Collection Time: 07/05/15  5:04 AM  Result Value Ref Range   WBC 5.6 4.0 - 10.5 K/uL   RBC 3.95 3.87 - 5.11 MIL/uL   Hemoglobin 11.8 (L) 12.0 - 15.0 g/dL   HCT 36.0 36.0 - 46.0 %   MCV 91.1 78.0 - 100.0 fL   MCH 29.9 26.0 - 34.0 pg   MCHC 32.8 30.0 - 36.0 g/dL   RDW 13.0 11.5 - 15.5 %   Platelets 240 150 - 400 K/uL   Neutrophils Relative % 44 %   Neutro Abs 2.4 1.7 - 7.7 K/uL   Lymphocytes Relative 45 %   Lymphs Abs 2.5 0.7 - 4.0 K/uL   Monocytes Relative 5 %   Monocytes Absolute 0.3 0.1 - 1.0 K/uL   Eosinophils Relative 6 %   Eosinophils Absolute 0.3 0.0 - 0.7 K/uL   Basophils Relative 0 %   Basophils Absolute 0.0 0.0 - 0.1 K/uL  Ethanol     Status: None    Collection Time: 07/05/15  5:04 AM  Result Value Ref Range   Alcohol, Ethyl (B) <5 <5 mg/dL    Comment:        LOWEST DETECTABLE LIMIT FOR SERUM ALCOHOL IS 5 mg/dL FOR MEDICAL PURPOSES ONLY   Urine rapid drug screen (hosp performed)     Status: None   Collection Time: 07/05/15  5:05 AM  Result Value Ref Range   Opiates NONE DETECTED NONE DETECTED   Cocaine NONE DETECTED NONE DETECTED   Benzodiazepines NONE DETECTED NONE DETECTED   Amphetamines NONE DETECTED NONE DETECTED   Tetrahydrocannabinol NONE DETECTED NONE  DETECTED   Barbiturates NONE DETECTED NONE DETECTED    Comment:        DRUG SCREEN FOR MEDICAL PURPOSES ONLY.  IF CONFIRMATION IS NEEDED FOR ANY PURPOSE, NOTIFY LAB WITHIN 5 DAYS.        LOWEST DETECTABLE LIMITS FOR URINE DRUG SCREEN Drug Class       Cutoff (ng/mL) Amphetamine      1000 Barbiturate      200 Benzodiazepine   003 Tricyclics       704 Opiates          300 Cocaine          300 THC              50     Current Facility-Administered Medications  Medication Dose Route Frequency Provider Last Rate Last Dose  . acetaminophen (TYLENOL) tablet 650 mg  650 mg Oral Q4H PRN Charlann Lange, PA-C       Current Outpatient Prescriptions  Medication Sig Dispense Refill  . divalproex (DEPAKOTE) 500 MG DR tablet Take 1,000 mg by mouth at bedtime.    Marland Kitchen lithium carbonate 300 MG capsule Take 300 mg by mouth 2 (two) times daily.    . traZODone (DESYREL) 150 MG tablet Take 1 tablet (150 mg total) by mouth at bedtime as needed for sleep. 30 tablet 0  . ARIPiprazole (ABILIFY) 30 MG tablet Take 1 tablet (30 mg total) by mouth at bedtime. (Patient not taking: Reported on 04/23/2015) 30 tablet 0  . ARIPiprazole 400 MG SUSR Inject 400 mg into the muscle every 28 (twenty-eight) days. For Schizophrenia (Patient not taking: Reported on 06/28/2015) 1 each 0  . benztropine (COGENTIN) 0.5 MG tablet Take 1 tablet (0.5 mg total) by mouth at bedtime. For drug induced extrapyramidal  reaction 30 tablet 0  . budesonide-formoterol (SYMBICORT) 160-4.5 MCG/ACT inhaler Inhale 2 puffs into the lungs 2 (two) times daily. For Asthma (Patient not taking: Reported on 04/23/2015) 1 Inhaler 12  . divalproex (DEPAKOTE ER) 500 MG 24 hr tablet Take 2 tablets (1,000 mg total) by mouth at bedtime. For mood stabilization (Patient not taking: Reported on 06/28/2015) 60 tablet 0  . hydrOXYzine (ATARAX/VISTARIL) 25 MG tablet Take 1 tablet (25 mg total) by mouth 3 (three) times daily as needed for itching or anxiety (sleep). (Patient not taking: Reported on 04/23/2015) 30 tablet 0  . lithium carbonate (LITHOBID) 300 MG CR tablet Take 1 tablet (300 mg total) by mouth daily. For mood stabilization (Patient not taking: Reported on 06/28/2015) 30 tablet 0  . meloxicam (MOBIC) 7.5 MG tablet Take 1 tablet (7.5 mg total) by mouth daily. (Patient not taking: Reported on 06/28/2015) 30 tablet 0    Musculoskeletal: Strength & Muscle Tone: within normal limits Gait & Station: normal Patient leans: N/A  Psychiatric Specialty Exam: Review of Systems  Constitutional: Negative.   HENT: Negative.   Eyes: Negative.   Respiratory: Negative.   Cardiovascular: Negative.   Gastrointestinal: Negative.   Genitourinary: Negative.   Musculoskeletal: Negative.   Skin: Negative.   Neurological: Negative.   Endo/Heme/Allergies: Negative.   Psychiatric/Behavioral:       Negative    Blood pressure 101/56, pulse 66, temperature 97.8 F (36.6 C), temperature source Oral, resp. rate 18, last menstrual period 06/24/2015, SpO2 100 %.There is no weight on file to calculate BMI.  General Appearance: Casual  Eye Contact::  Good  Speech:  Normal Rate  Volume:  Normal  Mood:  Anxious  Affect:  Congruent  Thought  Process:  Coherent  Orientation:  Full (Time, Place, and Person)  Thought Content:  WDL  Suicidal Thoughts:  No  Homicidal Thoughts:  No  Memory:  Immediate;   Good Recent;   Good Remote;   Good   Judgement:  Fair  Insight:  Fair  Psychomotor Activity:  Normal  Concentration:  Good  Recall:  Good  Fund of Knowledge:Good  Language: Good  Akathisia:  No  Handed:  Right  AIMS (if indicated):     Assets:  Housing Leisure Time Physical Health Resilience Social Support  ADL's:  Intact  Cognition: WNL  Sleep:      Treatment Plan Summary: Daily contact with patient to assess and evaluate symptoms and progress in treatment, Medication management and Plan schizoaffective disorder, bipolar type: -Crisis stabilization -Individual counseling  Disposition: No evidence of imminent risk to self or others at present.    Waylan Boga, [place] 07/05/2015 10:53 AM Patient seen face-to-face for psychiatric evaluation, chart reviewed and case discussed with the physician extender and developed treatment plan. Reviewed the information documented and agree with the treatment plan. Corena Pilgrim, MD

## 2015-07-05 NOTE — ED Notes (Signed)
D:Pt is awake this morning eating breakfast and watching TV. Pt says that the police are after her uncle, then she said that someone else was after her uncle. She went on to say that he died August the 1st and was found in a wheelchair in [place]. She says that she has orientation at [employer]. A:Offered support and 15 minute checks. R:Safety maintained on the unit.

## 2015-07-05 NOTE — ED Notes (Signed)
Patient is alert and oriented x4.  She is wanting help with depression.  Patient states that on Thursday she went to therapy  For depression and during therapy a "trigger" from her past was talked about keyed her depression to flare.  In addition the  Patient is going through a relationship issue with her husband.  Patient states that she is living in an unsafe environment due To others having drugs in the house.

## 2015-07-05 NOTE — BH Assessment (Signed)
Assessment completed. Consulted Arlester Marker, NP who recommended inpatient treatment. TTS to seek placement. Informed Charlann Lange, PA-C of recommendation.

## 2015-07-05 NOTE — Discharge Instructions (Signed)
For your ongoing mental health needs, you are advised to continue treatment with the [facility] ACT Team:       [facility]      [address]      [place], [place] [address]      [phone]

## 2015-07-15 ENCOUNTER — Emergency Department (HOSPITAL_COMMUNITY)
Admission: EM | Admit: 2015-07-15 | Discharge: 2015-07-15 | Disposition: A | Discharge status: Other Acute Inpatient Hospital | Payer: Medicare Other | Attending: Emergency Medicine | Admitting: Emergency Medicine

## 2015-07-15 ENCOUNTER — Encounter (HOSPITAL_COMMUNITY): Payer: Self-pay

## 2015-07-15 ENCOUNTER — Encounter (HOSPITAL_COMMUNITY): Payer: Self-pay | Admitting: Emergency Medicine

## 2015-07-15 ENCOUNTER — Inpatient Hospital Stay (HOSPITAL_COMMUNITY)
Admission: AD | Admit: 2015-07-15 | Discharge: 2015-07-23 | DRG: 885 | Disposition: A | Discharge status: Home / Self Care | Payer: Medicare Other | Source: Intra-hospital | Attending: Psychiatry | Admitting: Psychiatry

## 2015-07-15 ENCOUNTER — Telehealth (HOSPITAL_COMMUNITY): Payer: Self-pay | Admitting: Licensed Clinical Social Worker

## 2015-07-15 DIAGNOSIS — Z791 Long term (current) use of non-steroidal anti-inflammatories (NSAID): Secondary | ICD-10-CM | POA: Insufficient documentation

## 2015-07-15 DIAGNOSIS — Z79899 Other long term (current) drug therapy: Secondary | ICD-10-CM | POA: Diagnosis not present

## 2015-07-15 DIAGNOSIS — I10 Essential (primary) hypertension: Secondary | ICD-10-CM | POA: Insufficient documentation

## 2015-07-15 DIAGNOSIS — Z87891 Personal history of nicotine dependence: Secondary | ICD-10-CM | POA: Insufficient documentation

## 2015-07-15 DIAGNOSIS — F419 Anxiety disorder, unspecified: Secondary | ICD-10-CM | POA: Diagnosis not present

## 2015-07-15 DIAGNOSIS — F22 Delusional disorders: Secondary | ICD-10-CM | POA: Insufficient documentation

## 2015-07-15 DIAGNOSIS — J45909 Unspecified asthma, uncomplicated: Secondary | ICD-10-CM | POA: Diagnosis not present

## 2015-07-15 DIAGNOSIS — F250 Schizoaffective disorder, bipolar type: Secondary | ICD-10-CM | POA: Insufficient documentation

## 2015-07-15 DIAGNOSIS — Z862 Personal history of diseases of the blood and blood-forming organs and certain disorders involving the immune mechanism: Secondary | ICD-10-CM | POA: Diagnosis not present

## 2015-07-15 DIAGNOSIS — F319 Bipolar disorder, unspecified: Secondary | ICD-10-CM | POA: Insufficient documentation

## 2015-07-15 DIAGNOSIS — Z7951 Long term (current) use of inhaled steroids: Secondary | ICD-10-CM | POA: Insufficient documentation

## 2015-07-15 DIAGNOSIS — E119 Type 2 diabetes mellitus without complications: Secondary | ICD-10-CM | POA: Diagnosis present

## 2015-07-15 DIAGNOSIS — G4700 Insomnia, unspecified: Secondary | ICD-10-CM | POA: Diagnosis not present

## 2015-07-15 DIAGNOSIS — Z9119 Patient's noncompliance with other medical treatment and regimen: Secondary | ICD-10-CM

## 2015-07-15 DIAGNOSIS — Z880 Allergy status to penicillin: Secondary | ICD-10-CM | POA: Diagnosis not present

## 2015-07-15 DIAGNOSIS — G40909 Epilepsy, unspecified, not intractable, without status epilepticus: Secondary | ICD-10-CM | POA: Insufficient documentation

## 2015-07-15 DIAGNOSIS — Z008 Encounter for other general examination: Secondary | ICD-10-CM | POA: Diagnosis present

## 2015-07-15 DIAGNOSIS — Z91199 Patient's noncompliance with other medical treatment and regimen due to unspecified reason: Secondary | ICD-10-CM

## 2015-07-15 DIAGNOSIS — Z915 Personal history of self-harm: Secondary | ICD-10-CM

## 2015-07-15 DIAGNOSIS — F203 Undifferentiated schizophrenia: Secondary | ICD-10-CM | POA: Diagnosis present

## 2015-07-15 LAB — COMPREHENSIVE METABOLIC PANEL
ALBUMIN: 4.5 g/dL (ref 3.5–5.0)
ALK PHOS: 54 U/L (ref 38–126)
ALT: 20 U/L (ref 14–54)
ANION GAP: 9 (ref 5–15)
AST: 21 U/L (ref 15–41)
BUN: 17 mg/dL (ref 6–20)
CALCIUM: 9.5 mg/dL (ref 8.9–10.3)
CHLORIDE: 104 mmol/L (ref 101–111)
CO2: 24 mmol/L (ref 22–32)
Creatinine, Ser: 0.82 mg/dL (ref 0.44–1.00)
GFR calc non Af Amer: >60 mL/min (ref >60)
GLUCOSE: 90 mg/dL (ref 65–99)
Potassium: 3.3 mmol/L — ABNORMAL LOW (ref 3.5–5.1)
SODIUM: 137 mmol/L (ref 135–145)
Total Bilirubin: 0.9 mg/dL (ref 0.3–1.2)
Total Protein: 7.9 g/dL (ref 6.5–8.1)

## 2015-07-15 LAB — RAPID URINE DRUG SCREEN, HOSP PERFORMED
AMPHETAMINES: NOT DETECTED
BARBITURATES: NOT DETECTED
Benzodiazepines: NOT DETECTED
Cocaine: NOT DETECTED
Opiates: NOT DETECTED
TETRAHYDROCANNABINOL: NOT DETECTED

## 2015-07-15 LAB — CBC
HEMATOCRIT: 35.9 % — AB (ref 36.0–46.0)
HEMOGLOBIN: 11.9 g/dL — AB (ref 12.0–15.0)
MCH: 30.5 pg (ref 26.0–34.0)
MCHC: 33.1 g/dL (ref 30.0–36.0)
MCV: 92.1 fL (ref 78.0–100.0)
Platelets: 240 10*3/uL (ref 150–400)
RBC: 3.9 MIL/uL (ref 3.87–5.11)
RDW: 13.2 % (ref 11.5–15.5)
WBC: 5.1 10*3/uL (ref 4.0–10.5)

## 2015-07-15 LAB — ETHANOL

## 2015-07-15 LAB — LITHIUM LEVEL

## 2015-07-15 LAB — VALPROIC ACID LEVEL

## 2015-07-15 MED ORDER — ACETAMINOPHEN 325 MG PO TABS
650.0000 mg | ORAL_TABLET | Freq: Four times a day (QID) | ORAL | Status: DC | PRN
  Administered 2015-07-17 – 2015-07-20 (×2): 650 mg via ORAL
  Filled 2015-07-15 (×2): qty 2

## 2015-07-15 MED ORDER — HYDROXYZINE HCL 25 MG PO TABS: 25.0000 mg | ORAL_TABLET | Freq: Three times a day (TID) | ORAL | Status: DC | PRN

## 2015-07-15 MED ORDER — TRAZODONE HCL 150 MG PO TABS
150.0000 mg | ORAL_TABLET | Freq: Every evening | ORAL | Status: DC | PRN
  Administered 2015-07-17 – 2015-07-22 (×4): 150 mg via ORAL
  Filled 2015-07-15 (×4): qty 1

## 2015-07-15 NOTE — ED Notes (Signed)
3 patient belonging bags placed in locker #27.

## 2015-07-15 NOTE — ED Notes (Signed)
MD at bedside. 

## 2015-07-15 NOTE — Progress Notes (Signed)
Patient is an 44 year old female who is admitted to [facility] unit with paranoid and delusional behaviors.  Patient currently denies suicidal ideation but reports auditory and visual hallucinations.  Patient states "I'm tired of fighting and arguing with my family all the time."  'I just want to slow down and be safe."  Patient is alert and oriented to person, place and time.  Patient oriented to the unit, staff and room.  Routine safety checks initiated.  Patient safe on the unit.

## 2015-07-15 NOTE — ED Notes (Signed)
Pt. Made aware for the need of urine. 

## 2015-07-15 NOTE — BH Assessment (Addendum)
Assessment Note  [name] is an 44 y.o. female  to [facility] due to increasing depression. Patient has not had her Abilify since July and states that she is having worsening paranoid delusions and does not feel safe. Patient has noted similar and previous episodes of being off medications. [name] psychiatriric treated started back in 1993. Patient has flight of ideas speaking of various topics including  her uncle whom she believes is doing drugs and trying to hurt her. Patient also stating that her son is asking for sexual favors and believes various  people are out trying to get her.   Patient denies SI, HI, and although appears delusional denies AVH's. Patient denies alcohol and drug use. Patient has a outpatient provider at [facility]. She is also receiving [name] services from [place].     Diagnosis: Schizoaffective disorder, bipolar type   Past Medical History:  Past Medical History  Diagnosis Date  . Anemia   . Asthma   . Hypertension   . Schizophrenic disorder ([place])   . Seizures ([place])   . Anxiety   . Depression   . Insomnia, persistent   . Bipolar 1 disorder ([place])   . Diabetes mellitus without complication [facility])     Past Surgical History  Procedure Laterality Date  . Tonsillectomy      Family History:  Family History  Problem Relation Age of Onset  . Depression Mother   . Gout Mother     Social History:  reports that she has quit smoking. She has never used smokeless tobacco. She reports that she does not drink alcohol or use illicit drugs.  Additional Social History:  Alcohol / Drug Use Pain Medications: SEE MAR Prescriptions: Patient sts that she is prescribed Abilify but hasn't taken since July 2016 Over the Counter: SEE MAR History of alcohol / drug use?: No history of alcohol / drug abuse (Patient denies )  CIWA: CIWA-Ar BP: 124/80 mmHg Pulse Rate: 85 COWS:    Allergies:  Allergies  Allergen Reactions  . Haldol [Haloperidol Decanoate] Other (See  Comments)    Stiffness, eyes bulging  . Penicillins Nausea And Vomiting    Has patient had a PCN reaction causing immediate rash, facial/tongue/throat swelling, SOB or lightheadedness with hypotension:UNSURE  Has patient had a PCN reaction causing severe rash involving mucus membranes or skin necrosis: UNSURE Has patient had a PCN reaction that required hospitalization:UNSURE Has patient had a PCN reaction occurring within the last 10 years:No If all of the above answers are "NO", then may proceed with Cephalosporin use. CHILDHOOD REACTION  . Shrimp [Shellfish Allergy] Rash    Home Medications:  (Not in a hospital admission)  OB/GYN Status:  [name] last menstrual period was 06/24/2015.  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   Education Status Is patient currently in school?:  No Highest grade of school patient has completed: N/A Name of school: [employer]" ) Contact person: N/A  Risk to self with the past 6 months Suicidal Ideation: No Has patient been a risk to self within the past 6 months prior to admission? : No Suicidal Intent: No Has patient  had any suicidal intent within the past 6 months prior to admission? : No Is patient at risk for suicide?: No Suicidal Plan?: No Has patient had any suicidal plan within the past 6 months prior to admission? : No Access to Means: No What has been your use of drugs/alcohol within the last 12 months?:  (patient denies ) Previous Attempts/Gestures: Yes How many times?:  (3x's) Other Self Harm Risks:  (none reported) Triggers for Past Attempts: Unpredictable Intentional Self Injurious Behavior: None Family Suicide History: Yes (Uncle ) Recent stressful life event(s): Financial Problems Persecutory voices/beliefs?: No Depression: Yes Depression Symptoms: Despondent Substance abuse history and/or treatment for substance abuse?: No Suicide prevention information given to non-admitted patients: Not applicable  Risk to Others within the past 6 months Homicidal Ideation: No Does patient have any lifetime risk of violence toward others beyond the six months prior to admission? : No Thoughts of Harm to Others: No Current Homicidal Intent: No Current Homicidal Plan: No Access to Homicidal Means: No Identified Victim:  (n/a) History of harm to others?: No Assessment of Violence: None Noted Violent Behavior Description:  (patient cooperative) Does patient have access to weapons?: No Criminal Charges Pending?: No Does patient have a court date: No Is patient on probation?: No  Psychosis Hallucinations:  (pt denies ) Delusions:  (pt denies)  Mental Status Report Appearance/Hygiene: Disheveled Eye Contact: Fair Motor Activity: Freedom of movement Speech: Logical/coherent, Soft Level of Consciousness: Quiet/awake Mood: Suspicious Affect: Blunted Anxiety Level: None Thought Processes: Circumstantial, Flight of Ideas, Irrelevant Judgement: Impaired Orientation: Appropriate for developmental age Obsessive Compulsive Thoughts/Behaviors: None  Cognitive Functioning Concentration:  Fair Memory: Recent Intact, Remote Intact IQ: Average Insight: Poor Impulse Control: Poor Appetite: Fair Weight Loss:  (none reported) Weight Gain:  (none reported) Sleep: Decreased Total Hours of Sleep:  (varies ) Vegetative Symptoms: None  ADLScreening [facility] Assessment Services) [name] cognitive ability adequate to safely complete daily activities?: Yes Patient able to express need for assistance with ADLs?: Yes Independently performs ADLs?: Yes (appropriate for developmental age)  Prior Inpatient Therapy Prior Inpatient Therapy: Yes Prior Therapy Dates: 2016, 2014, 2013, 2012, 2010 Prior Therapy Facilty/Provider(s): [facility]  Reason for Treatment: Depression/Suicidal ideations   Prior Outpatient Therapy Prior Outpatient Therapy: Yes Prior Therapy Dates: Current  Prior Therapy Facilty/Provider(s): [facility] Reason for Treatment: Medication management  Does patient have an ACCT team?: Yes ([facility]; pt does not recall name of Engineer, water) Does patient have Intensive In-House Services?  : No Does patient have [facility] services? : No Does patient have P4CC services?: No  ADL Screening (condition at time of admission) [name] cognitive ability adequate to safely complete daily activities?: Yes Is the patient deaf or have difficulty hearing?: No Does the patient have difficulty seeing, even when wearing glasses/contacts?: No Does the patient have difficulty concentrating, remembering, or making decisions?: No Patient able to express need for assistance with ADLs?: Yes Does the patient have difficulty dressing or bathing?: No Independently performs ADLs?: Yes (appropriate for developmental age) Does the patient have difficulty walking or climbing stairs?: No Weakness of Legs: None Weakness of Arms/Hands: None  Home Assistive Devices/Equipment Home Assistive Devices/Equipment: None  Abuse/Neglect Assessment (Assessment to be complete while patient is  alone) Physical Abuse: Denies Verbal Abuse: Denies Sexual Abuse: Denies Exploitation of patient/patient's resources: Denies Self-Neglect: Denies Values / Beliefs Cultural Requests During Hospitalization: None Spiritual Requests During Hospitalization: None   Advance Directives (For Healthcare) Does patient have an advance directive?: No Would patient like information on creating an advanced directive?: No - patient declined information    Additional Information 1:1 In Past 12 Months?: No CIRT Risk: No Elopement Risk: No Does patient have medical clearance?: Yes     Disposition:  Disposition Disposition of Patient: Inpatient treatment program (Dr. Darleene Cleaver and Reginold Agent, NP recommend inpatient treatment) Type of inpatient treatment program: Adult  On Site Evaluation by:   Reviewed with Physician:    Waldon Merl [facility] 07/15/2015 8:35 AM

## 2015-07-15 NOTE — ED Notes (Signed)
breakfast meal at bedside

## 2015-07-15 NOTE — BH Assessment (Signed)
[place] Assessment Progress Note  Per Corena Pilgrim, MD, this pt requires psychiatric hospitalization at this time.  Debarah Crape, RN, [facility] has assigned pt to [facility] Rm 504-1.  Pt has signed Voluntary Admission and Consent for Treatment, as well as Consent to Release Information to [facility] ACT Team and to [facility], her outpatient providers, and notification calls have been placed.  Signed forms have been faxed to [facility].  Pt's nurse has been notified, and agrees to send original paperwork along with pt via [place], and to call report to [phone].  Jalene Mullet, [place] Triage Specialist [phone]

## 2015-07-15 NOTE — ED Provider Notes (Signed)
CSN: [id number]     Arrival date & time 07/15/15  0426 History   First MD Initiated Contact with Patient 07/15/15 0701     Chief Complaint  Patient presents with  . Medical Clearance     (Consider location/radiation/quality/duration/timing/severity/associated sxs/prior Treatment) HPI  This is a 44 year old female with a past history of bipolar, diabetes schizophrenia, hypertension, asthma presents to the emergency department today and he comes in psychosis. Patient has not had her Abilify since July and states that she is having worsening paranoid delusions and does not feel safe. This is similar to previous episodes of being off medications are prior to being treated starting back in 1993. She has thoughts that her uncle is doing drugs and trying to hurt her, her son is asking for sexual favors, there are people out trying to get her. No medical complaints such as shortness of breath, chest pain, cough. She states she did fall back in May but no other recent events.  Past Medical History  Diagnosis Date  . Anemia   . Asthma   . Hypertension   . Schizophrenic disorder ([place])   . Seizures ([place])   . Anxiety   . Depression   . Insomnia, persistent   . Bipolar 1 disorder ([place])   . Diabetes mellitus without complication [facility])    Past Surgical History  Procedure Laterality Date  . Tonsillectomy     Family History  Problem Relation Age of Onset  . Depression Mother   . Gout Mother   . Alcoholism Other    Social History  Substance Use Topics  . Smoking status: Former Research scientist (life sciences)  . Smokeless tobacco: Never Used  . Alcohol Use: No   OB History    No data available     Review of Systems  HENT: Negative for congestion and drooling.   Respiratory: Negative for cough and shortness of breath.   Cardiovascular: Negative for chest pain and leg swelling.  Psychiatric/Behavioral: Positive for dysphoric mood and decreased concentration. Negative for suicidal ideas, confusion and agitation.  The patient is not nervous/anxious and is not hyperactive.   All other systems reviewed and are negative.     Allergies  Haldol; Penicillins; and Shrimp  Home Medications   Prior to Admission medications   Medication Sig Start Date End Date Taking? Authorizing Provider  ARIPiprazole (ABILIFY) 30 MG tablet Take 1 tablet (30 mg total) by mouth at bedtime. Patient taking differently: Take 15-30 mg by mouth at bedtime. She is to take half a tablet at bedtime for 15 days then increase to one tablet at bedtime. 01/20/15  Yes Shuvon B Rankin, NP  benztropine (COGENTIN) 0.5 MG tablet Take 1 tablet (0.5 mg total) by mouth at bedtime. For drug induced extrapyramidal reaction 01/20/15  Yes Shuvon B Rankin, NP  budesonide-formoterol (SYMBICORT) 160-4.5 MCG/ACT inhaler Inhale 2 puffs into the lungs 2 (two) times daily. For Asthma 01/20/15  Yes Shuvon B Rankin, NP  divalproex (DEPAKOTE ER) 500 MG 24 hr tablet Take 2 tablets (1,000 mg total) by mouth at bedtime. For mood stabilization 01/20/15  Yes Shuvon B Rankin, NP  hydrOXYzine (ATARAX/VISTARIL) 25 MG tablet Take 1 tablet (25 mg total) by mouth 3 (three) times daily as needed for itching or anxiety (sleep). 01/20/15  Yes Shuvon B Rankin, NP  lithium carbonate 300 MG capsule Take 300 mg by mouth 2 (two) times daily.   Yes Historical Provider, MD  meloxicam (MOBIC) 15 MG tablet Take 15 mg by mouth daily.  Yes Historical Provider, MD  traZODone (DESYREL) 150 MG tablet Take 1 tablet (150 mg total) by mouth at bedtime as needed for sleep. Patient taking differently: Take 150 mg by mouth at bedtime.  01/20/15  Yes Shuvon B Rankin, NP   BP 107/66 mmHg  Pulse 80  Temp(Src) 98 F (36.7 C) (Oral)  Resp 16  Ht 5\' 6"  (1.676 m)  Wt 239 lb (108.41 kg)  BMI 38.59 kg/m2  SpO2 100%  LMP 06/24/2015 Physical Exam  Constitutional: She is oriented to person, place, and time. She appears well-developed and well-nourished.  HENT:  Head: Normocephalic and atraumatic.   Eyes: EOM are normal. Pupils are equal, round, and reactive to light.  Neck: Normal range of motion. Neck supple.  Cardiovascular: Normal rate and regular rhythm.   Pulmonary/Chest: Effort normal and breath sounds normal. No stridor. No respiratory distress. She has no wheezes. She has no rales.  Abdominal: Soft. She exhibits no distension. There is no tenderness.  Musculoskeletal: Normal range of motion. She exhibits no edema.  Neurological: She is alert and oriented to person, place, and time.  Psychiatric: Her mood appears not anxious. Her speech is tangential. Her speech is not rapid and/or pressured and not slurred. Thought content is paranoid and delusional. She does not exhibit a depressed mood. She expresses no homicidal and no suicidal ideation.  Nursing note and vitals reviewed.   ED Course  Procedures (including critical care time) Labs Review Labs Reviewed  COMPREHENSIVE METABOLIC PANEL - Abnormal; Notable for the following:    Potassium 3.3 (*)    All other components within normal limits  CBC - Abnormal; Notable for the following:    Hemoglobin 11.9 (*)    HCT 35.9 (*)    All other components within normal limits  VALPROIC ACID LEVEL - Abnormal; Notable for the following:    Valproic Acid Lvl <10 (*)    All other components within normal limits  LITHIUM LEVEL - Abnormal; Notable for the following:    Lithium Lvl <0.06 (*)    All other components within normal limits  ETHANOL  URINE RAPID DRUG SCREEN, HOSP PERFORMED    Imaging Review No results found. I have personally reviewed and evaluated these images and lab results as part of my medical decision-making.   EKG Interpretation None      MDM   Final diagnoses:  Paranoia ([place])  Schizoaffective disorder, bipolar type ([place])  Noncompliance with treatment   44 year old female schizophrenia here decompensated psychosis. Unclear she is a danger to herself or others however she denies suicidality homicidal at  this time. We will ask TTS to evaluate for recommendations discharge versus admission.     Merrily Pew, MD 07/17/15 [phone]

## 2015-07-15 NOTE — ED Notes (Signed)
[facility] notified of the need of transport

## 2015-07-15 NOTE — Progress Notes (Signed)
D: Pt denies SI/HI/AVH. Pt is pleasant and cooperative. Pt appears disorganized at times. Pt appears delusional when she is telling her stories. Pt presents with flight of ideas and has trouble completing her thoughts at times. Pt observed interacting on unit with peers.   A: Pt was offered support and encouragement. Pt was given scheduled medications. Pt was encourage to attend groups. Q 15 minute checks were done for safety.   R:Pt attends groups and interacts well with peers and staff. Pt is taking medication. Pt has no complaints at this time .Pt receptive to treatment and safety maintained on unit.

## 2015-07-15 NOTE — Tx Team (Signed)
Initial Interdisciplinary Treatment Plan   PATIENT STRESSORS: Financial difficulties Marital or family conflict   PATIENT STRENGTHS: Average or above average intelligence   PROBLEM LIST: Problem List/Patient Goals Date to be addressed Date deferred Reason deferred Estimated date of resolution  "slow down" 07/15/15           "don't want to argue with family" 07/15/15           Anxiety 07/15/15                              DISCHARGE CRITERIA:  Ability to meet basic life and health needs Adequate post-discharge living arrangements Safe-care adequate arrangements made  PRELIMINARY DISCHARGE PLAN: Attend aftercare/continuing care group Outpatient therapy Placement in alternative living arrangements Return to previous living arrangement  PATIENT/FAMIILY INVOLVEMENT: This treatment plan has been presented to and reviewed with the patient, [name].  The patient and family have been given the opportunity to ask questions and make suggestions.  Mart Piggs 07/15/2015, 3:59 PM

## 2015-07-15 NOTE — ED Notes (Signed)
Pt transported by [facility] to ED for evaluation. Pt very paranoid, stating "they" are trying to hurt her, stating she believes she is being drugged and needs to go assisted living. Pt rambling about her uncle is really her boyfriend and that he is living in her home. Pt denies SI/HI

## 2015-07-15 NOTE — ED Notes (Signed)
Pt has in belonging bag:  Black purse, pink sandals, red shirt, cameo pj pants, flower shirt. Pt has a scarf on due to religious belief.

## 2015-07-15 NOTE — Progress Notes (Signed)
[facility] Group Notes:  (Nursing/MHT/Case Management/Adjunct)  Date:  07/15/2015  Time:  10:09 PM  Type of Therapy:  Psychoeducational Skills  Participation Level:  Active  Participation Quality:  Attentive  Affect:  Flat  Cognitive:  Lacking  Insight:  Lacking  Engagement in Group:  Off Topic  Modes of Intervention:  Education  Summary of Progress/Problems: The patient brought up a number of different topics in group and was disorganized. The patient verbalized that she was missing her scarf but was unable to complete the thought. She also brought up something with regards to her father. Finally, she indicated that she was having a bad headache and that she felt that she was beaten in the head.   Myda Detwiler S 07/15/2015, 10:09 PM

## 2015-07-15 NOTE — Progress Notes (Signed)
Patient ID: [name], female   DOB: 1970/12/15, 44 y.o.   MRN: [id number] PER STATE REGULATIONS 482.30  THIS CHART WAS REVIEWED FOR MEDICAL NECESSITY WITH RESPECT TO THE [name] ADMISSION/DURATION OF STAY.  NEXT REVIEW DATE: 07/19/15  Roma Schanz, RN, BSN CASE MANAGER

## 2015-07-16 ENCOUNTER — Encounter (HOSPITAL_COMMUNITY): Payer: Self-pay | Admitting: Psychiatry

## 2015-07-16 DIAGNOSIS — Z91199 Patient's noncompliance with other medical treatment and regimen due to unspecified reason: Secondary | ICD-10-CM

## 2015-07-16 DIAGNOSIS — Z9119 Patient's noncompliance with other medical treatment and regimen: Secondary | ICD-10-CM

## 2015-07-16 DIAGNOSIS — F250 Schizoaffective disorder, bipolar type: Principal | ICD-10-CM

## 2015-07-16 LAB — PREGNANCY, URINE: Preg Test, Ur: NEGATIVE

## 2015-07-16 MED ORDER — DIVALPROEX SODIUM ER 250 MG PO TB24
750.0000 mg | ORAL_TABLET | Freq: Every day | ORAL | Status: DC
  Administered 2015-07-16 – 2015-07-22 (×7): 750 mg via ORAL
  Filled 2015-07-16 (×9): qty 3

## 2015-07-16 MED ORDER — ARIPIPRAZOLE ER 400 MG IM SUSR
400.0000 mg | INTRAMUSCULAR | Status: DC
  Administered 2015-07-19: 400 mg via INTRAMUSCULAR

## 2015-07-16 MED ORDER — BENZTROPINE MESYLATE 0.5 MG PO TABS
0.5000 mg | ORAL_TABLET | ORAL | Status: DC
  Administered 2015-07-16 – 2015-07-23 (×15): 0.5 mg via ORAL
  Filled 2015-07-16 (×19): qty 1

## 2015-07-16 MED ORDER — OLANZAPINE 10 MG IM SOLR: 10.0000 mg | Freq: Three times a day (TID) | INTRAMUSCULAR | Status: DC | PRN

## 2015-07-16 MED ORDER — OLANZAPINE 10 MG PO TABS
10.0000 mg | ORAL_TABLET | Freq: Three times a day (TID) | ORAL | Status: DC | PRN
  Administered 2015-07-23: 10 mg via ORAL
  Filled 2015-07-16 (×2): qty 1

## 2015-07-16 MED ORDER — ARIPIPRAZOLE ER 400 MG IM SUSR
400.0000 mg | INTRAMUSCULAR | Status: DC
  Filled 2015-07-16: qty 400

## 2015-07-16 MED ORDER — POTASSIUM CHLORIDE CRYS ER 10 MEQ PO TBCR
10.0000 meq | EXTENDED_RELEASE_TABLET | Freq: Once | ORAL | Status: AC
  Administered 2015-07-16: 10 meq via ORAL
  Filled 2015-07-16 (×2): qty 1

## 2015-07-16 MED ORDER — ARIPIPRAZOLE 5 MG PO TABS
5.0000 mg | ORAL_TABLET | ORAL | Status: DC
  Administered 2015-07-16 – 2015-07-19 (×7): 5 mg via ORAL
  Filled 2015-07-16 (×11): qty 1

## 2015-07-16 NOTE — [facility] Group Notes (Signed)
[facility] LCSW Aftercare Discharge Planning Group Note   07/16/2015 3:28 PM  Participation Quality:    Mood/Affect:  Excited  Depression Rating:    Anxiety Rating:    Thoughts of Suicide:  No Will you contract for safety?   NA  Current AVH:  Denies  Plan for Discharge/Comments:  Pt presents as grandiose, pressured with flight of ideas and  bizarre speech and disorganization.  Transportation Means:   Supports:  Roque Lias B

## 2015-07-16 NOTE — [facility] Suicide Risk Assessment (Signed)
[facility] Admission Suicide Risk Assessment   Nursing information obtained from:  Patient Demographic factors:  NA Current Mental Status:  NA Loss Factors:  NA Historical Factors:  NA Risk Reduction Factors:  Positive therapeutic relationship Total Time spent with patient: 30 minutes Principal Problem: Schizoaffective disorder, bipolar type ([place]) Diagnosis:   Patient Active Problem List   Diagnosis Date Noted  . Noncompliance with treatment [Z91.19] 07/16/2015  . Schizoaffective disorder, bipolar type ([place]) [F25.0] 01/11/2015     Continued Clinical Symptoms:  Alcohol Use Disorder Identification Test Final Score (AUDIT): 0 The "Alcohol Use Disorders Identification Test", Guidelines for Use in Primary Care, Second Edition.  World Pharmacologist [facility]). Score between 0-7:  no or low risk or alcohol related problems. Score between 8-15:  moderate risk of alcohol related problems. Score between 16-19:  high risk of alcohol related problems. Score 20 or above:  warrants further diagnostic evaluation for alcohol dependence and treatment.   CLINICAL FACTORS:   Currently Psychotic Unstable or Poor Therapeutic Relationship Previous Psychiatric Diagnoses and Treatments   Musculoskeletal: Strength & Muscle Tone: within normal limits Gait & Station: normal Patient leans: N/A  Psychiatric Specialty Exam: Physical Exam  Review of Systems  Psychiatric/Behavioral: Positive for depression. The patient is nervous/anxious.   All other systems reviewed and are negative.   Blood pressure 119/83, pulse 72, temperature 98.5 F (36.9 C), temperature source Oral, resp. rate 20, height 5\' 6"  (1.676 m), weight 97.07 kg (214 lb), last menstrual period 06/24/2015, SpO2 100 %.Body mass index is 34.56 kg/(m^2).                    Please see H&P.                                      COGNITIVE FEATURES THAT CONTRIBUTE TO RISK:  Closed-mindedness, Polarized thinking and  Thought constriction (tunnel vision)    SUICIDE RISK:   Moderate:  Frequent suicidal ideation with limited intensity, and duration, some specificity in terms of plans, no associated intent, good self-control, limited dysphoria/symptomatology, some risk factors present, and identifiable protective factors, including available and accessible social support.  PLAN OF CARE: Please see H&P.   Medical Decision Making:  Review of Psycho-Social Stressors (1), Discuss test with performing physician (1), Decision to obtain old records (1), Review and summation of old records (2), Established Problem, Worsening (2), Review of Last Therapy Session (1), Review of Medication Regimen & Side Effects (2) and Review of New Medication or Change in Dosage (2)  I certify that inpatient services furnished can reasonably be expected to improve the patient's condition.   Abdifatah Colquhoun MD 07/16/2015, 12:30 PM

## 2015-07-16 NOTE — Plan of Care (Signed)
Problem: Alteration in mood Goal: LTG-Patient reports reduction in suicidal thoughts (Patient reports reduction in suicidal thoughts and is able to verbalize a safety plan for whenever patient is feeling suicidal)  Outcome: Progressing Pt denies SI at this time     
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
## 2015-07-16 NOTE — BHH Suicide Risk Assessment (Signed)
[place] INPATIENT:  Family/Significant Other Suicide Prevention Education  Suicide Prevention Education:  Education Completed; No one has been identified by the patient as the family member/significant other with whom the patient will be residing, and identified as the person(s) who will aid the patient in the event of a mental health crisis (suicidal ideations/suicide attempt).  With written consent from the patient, the family member/significant other has been provided the following suicide prevention education, prior to the and/or following the discharge of the patient.  The suicide prevention education provided includes the following:  Suicide risk factors  Suicide prevention and interventions  National Suicide Hotline telephone number  [facility] assessment telephone number  [facility] Emergency Assistance [place] and/or Residential Mobile Crisis Unit telephone number  Request made of family/significant other to:  Remove weapons (e.g., guns, rifles, knives), all items previously/currently identified as safety concern.    Remove drugs/medications (over-the-counter, prescriptions, illicit drugs), all items previously/currently identified as a safety concern.  The family member/significant other verbalizes understanding of the suicide prevention education information provided.  The family member/significant other agrees to remove the items of safety concern listed above. The patient did not endorse SI at the time of admission, nor did the patient c/o SI during the stay here.  SPE not required. However, I did talk with pt's mother, Ms Aeon, Tofte [phone], and we discussed a crises plan.  Roque Lias B 07/16/2015, 3:23 PM

## 2015-07-16 NOTE — Progress Notes (Signed)
D. Pt continues to be very disorganized, flight of ideas, loose associations. Poor hygiene, on her menses and messed herself but refused to shower. Patient presents with anxious affect and depressed mood.  Denies pain, auditory and visual hallucinations.  Rates depression at 10, hopelessness at 8, and anxiety at 0.  Maintained on routine safety checks.  Medications given as prescribed.  Support and encouragement offered as needed.  Attended group and participated.  States goal for today is "to see my family finally."  Patient observed socializing with peers in the dayroom.  Pt's safety ensured with 15 minute and environmental checks. Pt currently denies SI/HI and A/V hallucinations. Pt verbally agrees to seek staff if SI/HI or A/VH occurs and to consult with staff before acting on these thoughts. Will continue POC.

## 2015-07-16 NOTE — Progress Notes (Signed)
Adult Psychoeducational Group Note  Date:  07/16/2015 Time:  8:55 PM  Group Topic/Focus:  Wrap-Up Group:   The focus of this group is to help patients review their daily goal of treatment and discuss progress on daily workbooks.  Participation Level:  Active  Participation Quality:  Attentive  Affect:  Appropriate  Cognitive:  Appropriate  Insight: Good  Engagement in Group:  Engaged  Modes of Intervention:  Discussion  Additional Comments:  Patient rated her day a 5. 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  Donato Heinz 07/16/2015, 8:55 PM
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
## 2015-07-16 NOTE — BHH Group Notes (Signed)
[place] LCSW Group Therapy  07/16/2015 3:30 PM   Type of Therapy:  Group Therapy  Participation Level:  Active  Participation Quality:  Attentive  Affect:  Appropriate  Cognitive:  Appropriate  Insight:  Improving  Engagement in Therapy:  Engaged  Modes of Intervention:  Clarification, Education, Exploration and Socialization  Summary of Progress/Problems: Today's group focused on relapse prevention.  We defined the term, and then brainstormed on ways to prevent relapse.  Stayed the entire time.  Disruptive, but easily redirectable.  Unable to participate meaningfully in conversation.  Working in her notebook and packet for most of the time.  Roque Lias B 07/16/2015 , 3:30 PM

## 2015-07-16 NOTE — Tx Team (Signed)
Interdisciplinary Treatment Plan Update (Adult)  Date:  07/16/2015 Time Reviewed:  2:37 PM  Progress in Treatment: Attending groups: Yes. Participating in groups: Yes. Taking medication as prescribed:  Yes. Tolerating medication:  Yes. Family/Significant othe contact made:  Yes mother Patient understands diagnosis:  No  Limited insight Discussing patient identified problems/goals with staff:  Yes, see initial care plan. Medical problems stabilized or resolved:  Yes Denies suicidal/homicidal ideation: Yes. Issues/concerns per patient self-inventory: No. Other:  New problem(s) identified:   Discharge Plan or Barriers: See below   Reason for Continuation of Hospitalization: Delusions  Hallucinations Medication stabilization  Comments: [name] is a 44 y.o.AA femalewho has a hx of schizoaffective disorder , is single , lives with her uncle ( per pt report) in [place] , who presented to [facility] due to increasing depression. Per initial notes in EHR " Pt has not had her Abilify since July and stated that she is having worsening paranoid delusions and does not feel safe. Patient has noted similar and previous episodes of being off medications. [name] psychiatric needs started back in 1993. Patient has flight of ideas speaking of various topics including her uncle whom she believes is doing drugs and trying to hurt her. Patient also stating that her son is asking for sexual favors and believes various people are out trying to get her. " Abilify, Congentin, Depakote ER trial  Estimated length of stay: 4-5 days  New goal(s):  Review of initial/current patient goals per problem list:  1. Goal(s): Patient will participate in aftercare plan  Met: Yes   Target date: at discharge  As evidenced by: Patient will participate within aftercare plan AEB aftercare provider and housing plan at discharge being identified. 07/16/15: Pt will return home and follow-up outpt with ACT  team..   6. Goal (s): Patient will demonstrate decreased signs of mania  * Met: No  * Target date: 3-5 days post admission date  * As evidenced by: Patient demonstrate decreased signs of mania AEB decreased mood instability and demonstration of stable mood  07/16/15: Pt is still endorsing AVH, presents with pressured speech, flight of ideas, and disorganized thought process.    Attendees: Patient:  07/16/2015 2:37 PM  Family:   07/16/2015 2:37 PM  Physician:  Dr. Ursula Alert, MD 07/16/2015 2:37 PM  Nursing:  Hedy Jacob, RN  07/16/2015 2:37 PM  Case Manager:  Roque Lias, LCSW 07/16/2015 2:37 PM  Counselor:  Matthew Saras, MSW Intern 07/16/2015 2:37 PM  Other:   07/16/2015 2:37 PM  Other:   07/16/2015 2:37 PM  Other:   07/16/2015 2:37 PM  Other:  07/16/2015 2:37 PM  Other:    Other:    Other:    Other:    Other:    Other:      Scribe for Treatment Team:   Georga Kaufmann, MSW Intern 07/16/2015 2:37 PM

## 2015-07-16 NOTE — Progress Notes (Signed)
D: [name] attended group tonight. She is observed to have flight of ideas. Rates anxiety as "HIGH" 8/10. Denies SI/HI/AVH. However when I ask her questions she turns her head to the side away from me several times as if she is responding to internal stimuli prior to answering my question. She states she is "VERY Depressed" but does not rate it. Says she wants to talk about her feelings all the time to keep from being depressed. Encouraged [name] to talk to staff including me if she is feeling hopeless or depressed. Encouraged [name] to attend groups as well in order to effectively communicate her emotions and obtain support from her peers as well.  A: Encouragement and support given.  R: Continue to monitor for patient safety and medication effectiveness.

## 2015-07-17 LAB — BASIC METABOLIC PANEL
ANION GAP: 10 (ref 5–15)
BUN: 10 mg/dL (ref 6–20)
CHLORIDE: 106 mmol/L (ref 101–111)
CO2: 24 mmol/L (ref 22–32)
Calcium: 9.6 mg/dL (ref 8.9–10.3)
Creatinine, Ser: 0.78 mg/dL (ref 0.44–1.00)
GFR calc Af Amer: >60 mL/min (ref >60)
GLUCOSE: 109 mg/dL — AB (ref 65–99)
POTASSIUM: 3.8 mmol/L (ref 3.5–5.1)
Sodium: 140 mmol/L (ref 135–145)

## 2015-07-17 LAB — VITAMIN B12: Vitamin B-12: 280 pg/mL (ref 180–914)

## 2015-07-17 LAB — IRON AND TIBC
IRON: 46 ug/dL (ref 28–170)
SATURATION RATIOS: 12 % (ref 10.4–31.8)
TIBC: 393 ug/dL (ref 250–450)
UIBC: 347 ug/dL

## 2015-07-17 LAB — FERRITIN: Ferritin: 20 ng/mL (ref 11–307)

## 2015-07-17 LAB — FOLATE: FOLATE: 6.7 ng/mL (ref >5.9)

## 2015-07-17 NOTE — Progress Notes (Signed)
D: [name] was observed tonight in the dayroom to have a full conversation with herself. She is asking herself questions and answering them and also making statements as if someone is asking her questions. However she denies SI/HI/AVH when questioned. When prompted to give a coping skill she states "Less talking and more listening". Her conversations with me are very random, she has flight of ideas, disorganized thought processing and loose associations. She states "I need to get out of here so I can get back to work. I have already signed my 72 hour notice". When I asked her what kind of work she does, she states "Oh pretty much the same stuff you do except I work with money at [employer]." A: Encouragement and support given. R: Continue to monitor for patient safety and medication effectiveness.

## 2015-07-17 NOTE — BHH Group Notes (Signed)
[place] Group Notes:  (Nursing/MHT/Case Management/Adjunct)  Date:  07/17/2015  Time:  11:14 AM  Type of Therapy: Nurse Education  Participation Level: Active  Participation Quality: Drowsy  Affect: Flat  Cognitive: Lacking  Insight: Lacking  Engagement in Group: Poor  Modes of Intervention: Education  Summary of Progress/Problems: Pt did attend self inventory group, pt reported that she was negative SI/HI, no AH/VH noted. Pt rated her depression as a 0, and her helplessness/hopelessness as a 0.   Pt reported no issues or concerns.  [name] 07/17/2015, 11:14 AM

## 2015-07-17 NOTE — BHH Group Notes (Signed)
[place] Group Notes:  (Nursing/MHT/Case Management/Adjunct)  Date:  07/17/2015  Time:  11:35 AM  Type of Therapy:  Nurse Education  Participation Level:  Minimal  Participation Quality:  Drowsy  Affect:  Flat  Cognitive:  Lacking  Insight:  Lacking  Engagement in Group:  Lacking  Modes of Intervention:  Education  Summary of Progress/Problems: Pt attended healthy coping skills group.   [name] 07/17/2015, 11:35 AM

## 2015-07-17 NOTE — Progress Notes (Signed)
[facility] MD Progress Note  07/17/2015  [name]  MRN:  [id number] Subjective: Pt states: "I'm doing well. I feel like my medications are helping a lot but I'm anemic, at least I think I'm anemic.   Objective: Pt seen and chart reviewed. Pt is alert and oriented to self, place, and intermittently to time and situation. She can usually answer level of orientation questions well, but is intermittently tangential, loose, and illogical with rumination about her job at [employer], her medications, and the other patients. She is anxious, yet cooperative, and appropriate to situation. Pt denies suicidal/homicidal ideation and psychosis and does not appear to be responding to internal stimuli. She cites an improvement in sleep and appetite yet is minimizing her psychotic symptoms which clearly persist as mentioned above, presenting more so in the form of manic/hypo-manic symptomology.   Principal Problem: Schizoaffective disorder, bipolar type ([place]) Diagnosis:   Patient Active Problem List   Diagnosis Date Noted  . Noncompliance with treatment [Z91.19] 07/16/2015    Priority: High  . Schizoaffective disorder, bipolar type ([place]) [F25.0] 01/11/2015    Priority: High    Total Time spent with patient: 15 minutes  Past Medical History:  Past Medical History  Diagnosis Date  . Anemia   . Asthma   . Hypertension   . Schizophrenic disorder ([place])   . Seizures ([place])   . Anxiety   . Depression   . Insomnia, persistent   . Bipolar 1 disorder ([place])   . Diabetes mellitus without complication [facility])     Past Surgical History  Procedure Laterality Date  . Tonsillectomy     Family History:  Family History  Problem Relation Age of Onset  . Depression Mother   . Gout Mother   . Alcoholism Other    Social History:  History  Alcohol Use No     History  Drug Use No    Social History   Social History  . Marital Status: Single    Spouse Name: N/A  . Number of Children: N/A  . Years of Education:  N/A   Social History Main Topics  . Smoking status: Former Research scientist (life sciences)  . Smokeless tobacco: Never Used  . Alcohol Use: No  . Drug Use: No  . Sexual Activity: No   Other Topics Concern  . None   Social History Narrative   Additional History:    Sleep: Good  Appetite:  Good   Musculoskeletal: Strength & Muscle Tone: within normal limits Gait & Station: normal Patient leans: N/A   Psychiatric Specialty Exam: Physical Exam  Review of Systems  Cardiovascular: Negative for chest pain and palpitations.  Skin: Negative for itching and rash.  Neurological: Negative for dizziness and tremors.  Psychiatric/Behavioral: Positive for depression. Negative for suicidal ideas. The patient is nervous/anxious and has insomnia.   All other systems reviewed and are negative.   Blood pressure 112/75, pulse 76, temperature 98.7 F (37.1 C), temperature source Oral, resp. rate 20, height 5' 6" (1.676 m), weight 97.07 kg (214 lb), last menstrual period 06/24/2015, SpO2 100 %.Body mass index is 34.56 kg/(m^2).  General Appearance: Casual and Fairly Groomed  Engineer, water::  Good  Speech:  Pressured intermittently  Volume:  Normal  Mood:  Anxious   Affect:  Non-Congruent and Inappropriate  Thought Process:  Disorganized, Loose and Tangential   Orientation:  Full (Time, Place, and Person)  Thought Content:  Paranoid Ideation and Rumination  Suicidal Thoughts:  No  Homicidal Thoughts:  Denies at this time  Memory:  Immediate;   Fair Recent;   Fair Remote;   Fair  Judgement:  Impaired  Insight:  Shallow  Psychomotor Activity:  Normal  Concentration:  Fair  Recall:  [employer] of Knowledge:Fair  Language: Fair  Akathisia:  No  Handed:  Right  AIMS (if indicated):     Assets:  Communication Skills Desire for Improvement Physical Health  ADL's:  Intact  Cognition: WNL  Sleep:  Number of Hours: 5.75   Current Medications: Current Facility-Administered Medications  Medication Dose Route  Frequency Provider Last Rate Last Dose  . acetaminophen (TYLENOL) tablet 650 mg  650 mg Oral Q6H PRN Delfin Gant, NP   650 mg at 07/17/15 0844  . ARIPiprazole (ABILIFY) tablet 5 mg  5 mg Oral BH-qamhs Saramma Eappen, MD   5 mg at 07/17/15 0844  . [START ON 07/19/2015] ARIPiprazole SUSR 400 mg  400 mg Intramuscular Q28 days Ursula Alert, MD      . benztropine (COGENTIN) tablet 0.5 mg  0.5 mg Oral BH-qamhs Saramma Eappen, MD   0.5 mg at 07/17/15 0844  . divalproex (DEPAKOTE ER) 24 hr tablet 750 mg  750 mg Oral QHS Saramma Eappen, MD   750 mg at 07/16/15 2230  . hydrOXYzine (ATARAX/VISTARIL) tablet 25 mg  25 mg Oral TID PRN Harriet Butte, NP      . OLANZapine (ZYPREXA) tablet 10 mg  10 mg Oral Q8H PRN Ursula Alert, MD       Or  . OLANZapine (ZYPREXA) injection 10 mg  10 mg Intramuscular Q8H PRN Ursula Alert, MD      . traZODone (DESYREL) tablet 150 mg  150 mg Oral QHS PRN Harriet Butte, NP        Lab Results:  Results for orders placed or performed during the hospital encounter of 07/15/15 (from the past 48 hour(s))  Pregnancy, urine     Status: None   Collection Time: 07/16/15  2:40 PM  Result Value Ref Range   Preg Test, Ur NEGATIVE NEGATIVE    Comment:        THE SENSITIVITY OF THIS METHODOLOGY IS >20 mIU/mL. Performed at [place] metabolic panel     Status: Abnormal   Collection Time: 07/17/15  6:26 AM  Result Value Ref Range   Sodium 140 135 - 145 mmol/L   Potassium 3.8 3.5 - 5.1 mmol/L   Chloride 106 101 - 111 mmol/L   CO2 24 22 - 32 mmol/L   Glucose, Bld 109 (H) 65 - 99 mg/dL   BUN 10 6 - 20 mg/dL   Creatinine, Ser 0.78 0.44 - 1.00 mg/dL   Calcium 9.6 8.9 - 10.3 mg/dL   GFR calc non Af Amer >60 >60 mL/min   GFR calc Af Amer >60 >60 mL/min    Comment: (NOTE) The eGFR has been calculated using the CKD EPI equation. This calculation has not been validated in all clinical situations. eGFR's persistently <60 mL/min signify possible  Chronic Kidney Disease.    Anion gap 10 5 - 15    Comment: Performed at [facility]  Vitamin B12     Status: None   Collection Time: 07/17/15  6:26 AM  Result Value Ref Range   Vitamin B-12 280 180 - 914 pg/mL    Comment: (NOTE) This assay is not validated for testing neonatal or myeloproliferative syndrome specimens for Vitamin B12 levels. Performed at [facility]   Folate  Status: None   Collection Time: 07/17/15  6:26 AM  Result Value Ref Range   Folate 6.7 >5.9 ng/mL    Comment: Performed at [facility]  Iron and TIBC     Status: None   Collection Time: 07/17/15  6:26 AM  Result Value Ref Range   Iron 46 28 - 170 ug/dL   TIBC 393 250 - 450 ug/dL   Saturation Ratios 12 10.4 - 31.8 %   UIBC 347 ug/dL    Comment: Performed at [facility]  Ferritin     Status: None   Collection Time: 07/17/15  6:26 AM  Result Value Ref Range   Ferritin 20 11 - 307 ng/mL    Comment: Performed at [facility]    Physical Findings: AIMS: Facial and Oral Movements Muscles of Facial Expression: None, normal Lips and Perioral Area: None, normal Jaw: None, normal Tongue: None, normal,Extremity Movements Upper (arms, wrists, hands, fingers): None, normal Lower (legs, knees, ankles, toes): None, normal, Trunk Movements Neck, shoulders, hips: None, normal, Overall Severity Severity of abnormal movements (highest score from questions above): None, normal Incapacitation due to abnormal movements: None, normal [name] awareness of abnormal movements (rate only patient's report): No Awareness, Dental Status Current problems with teeth and/or dentures?: No Does patient usually wear dentures?: No  CIWA:  CIWA-Ar Total: 4 COWS:     Assessment:  Patient remains psychotic, delusional, disorganized and irritable, although she is steadily improving. She can answer questions appropriately if redirected to do so, but loses thought cohesion as soon as  you stop directing the assessment, then becomes tangential and irrelevant with pressured speech.   Treatment Plan Summary: Daily contact with patient to assess and evaluate symptoms and progress in treatment and Medication management  Medications:  -Continue Abilify 58m bid (was at 372ma couple months ago last admission) -Continue Abilify suspension 40077m28 days, first dose 07/19/15 -Continue Cogentin 0.5mg73md for EPS prophylaxis -Continue Depakote ER 750mg70m for mood stabilization -Continue Trazodone 150mg 20mprn insomnia -Continue vistaril 25mg t16mrn anxiety -Continue Zyprexa 10mg PO18mIM q8h prn for psychosis/agitation excerbation -Will continue to monitor vitals ,medication compliance and treatment side effects while patient is here.  -Closely monitor for medical issues as well as call consult as needed.  -CSW will start working on disposition. Patient to participate in therapeutic milieu .   [place],Benjamine Mola 07/17/2015, 10:15 PM  Agree with NP Progress Note, as above  FernandoNeita Garnet

## 2015-07-17 NOTE — BHH Group Notes (Signed)
[place] Group Notes:  (Clinical Social Work)  07/17/2015  11:15-12:00PM  Summary of Progress/Problems:   Today's process group involved patients discussing their feelings related to being hospitalized, as well as how they want to feel in order to be ready to discharge.  It was agreed in general by the group that it would be preferable to avoid future hospitalizations, and there was a discussion about what each person will need to do to achieve that.   Problems related to adherence to medication recommendations were discussed, as well as importance of developing friendships and supports.  The patient expressed her primary feeling about being hospitalized is that her 72 hours are up and she desires to go home and go back to her job at [employer].  She encouraged others in the group to look for jobs there as well.    Type of Therapy:  Group Therapy - Process  Participation Level:  Active  Participation Quality:  Attentive, Sharing and Supportive  Affect:  Blunted  Cognitive:  Appropriate  Insight:  Engaged  Engagement in Therapy:  Engaged  Modes of Intervention:  Exploration, Discussion  Selmer Dominion, LCSW 07/17/2015, 12:58 PM

## 2015-07-17 NOTE — Progress Notes (Signed)
D- Patient is animated and anxious. Patient is observed in the milieu interacting well with her peers.  Patient has c/o of generalized pain on her left side.  Patient was given pain medication which offered relief.  At beginning of shift, patient endorsed passive SI without a plan.  Currently, patient denies SI, HI, AVH, and pain.  Patient observed responding to internal stimuli. On patient's self-inventory, patient rates her depression "5/10" and anxiety "10/10" with 10 being the worst.  Patient reports that she needs to leave so that she can get back to work.  Patient signed a 72 hour request for discharge today at 1400.   A- Scheduled medications administered to patient, per MD orders. Support and encouragement provided.  Routine safety checks conducted every 15 minutes.  Patient informed to notify staff with problems or concerns. R- No adverse drug reactions noted. Patient contracts for safety at this time. Patient compliant with medications and treatment plan. Patient receptive, calm, and cooperative. Patient interacts well with others on the unit.  Patient remains safe at this time.

## 2015-07-18 NOTE — BHH Group Notes (Signed)
[place] Group Notes:  (Clinical Social Work)  07/18/2015  [place] Group Notes:  (Clinical Social Work)  07/18/2015  11:00AM-12:00PM  Summary of Progress/Problems:  The main focus of today's process group was to listen to a variety of genres of music and to identify that different types of music provoke different responses.  The patient then was able to identify personally what was soothing for them, as well as energizing.  Handouts were used to record feelings evoked, as well as how patient can personally use this knowledge in sleep habits, with depression, and with other symptoms.  The patient expressed understanding of concepts, as well as knowledge of how each type of music affected her and how this can be used at home as a wellness/recovery tool.  Type of Therapy:  Music Therapy   Participation Level:  Active  Participation Quality:  Attentive and Sharing  Affect:  Blunted  Cognitive:  Disorganized  Insight:  Engaged  Engagement in Therapy:  Engaged  Modes of Intervention:   Activity, Exploration  Selmer Dominion, LCSW 07/18/2015

## 2015-07-18 NOTE — Progress Notes (Signed)
Pt reports she had a good day.  She was observed sitting in the dayroom most of the evening.  When writer engaged her in conversation, she began speaking rapidly, going from one subject to another.  She did speak of a group this morning where the group sang, and said she said she enjoyed it.  She denies SI/HI/AVH.  She is hoping to go home tomorrow.  She voiced no needs or concerns at this time.  Pt is med compliant on the unit.  Pt makes her needs known.  Support and encouragement offered.  Safety maintained with q15 minute checks.

## 2015-07-18 NOTE — Progress Notes (Signed)
[place] Group Notes:  (Nursing/MHT/Case Management/Adjunct)  Date:  07/18/2015  Time:  9:38 PM  Type of Therapy:  Psychoeducational Skills  Participation Level:  Active  Participation Quality:  Monopolizing and Redirectable  Affect:  Excited  Cognitive:  Disorganized  Insight:  Lacking  Engagement in Group:  Monopolizing and Off Topic  Modes of Intervention:  Education  Summary of Progress/Problems: The patient indicated that she had a good day and then began to talk having met her goal for the day. The patient did not explain what her goal was and then began to mention other topics at the same time. She also announced to the group what her home address was and that everyone was invited to come to her home for a cookout. The patient was redirected a number of times to discuss her day rather than other personal information. In terms of the theme for the day, her support system will consist of the staff and her peers from the hospital.   Archie Balboa S 07/18/2015, 9:38 PM

## 2015-07-18 NOTE — Progress Notes (Signed)
D. Pt still is disorganized and having flight of ideas. PT has improved in appearance, her hygine has improved. Pt has been in the day room, attended the group. Per self inventory, pt rates depression at a 0, hopelessness 0 and anxiety 0 Pt' daily goal is to "to go home" Pt reports poor sleep, a poor appetite, low energy and poorPt's safety ensured with 15 minute and environmental checks. Pt currently denies SI/HI and A/V hallucinations. Pt verbally agrees to seek staff if SI/HI or A/VH occurs and to consult with staff before acting on these thoughts. Will continue POC. concentration.

## 2015-07-18 NOTE — BHH Group Notes (Signed)
[place] Group Notes:  (Nursing/MHT/Case Management/Adjunct)  Date:  07/18/2015  Time:  1:53 PM  Type of Therapy:  Psychoeducational Skills  Participation Level:  Active  Participation Quality:  Appropriate  Affect:  Appropriate  Cognitive:  Appropriate  Insight:  Appropriate  Engagement in Group:  Engaged  Modes of Intervention:  Discussion  Summary of Progress/Problems: Pt did attend healthy support systems groups.     [name] Shanta 07/18/2015, 1:53 PM

## 2015-07-18 NOTE — Progress Notes (Signed)
[facility] MD Progress Note  07/18/2015  [name]  MRN:  [id number] Subjective: Pt states: "I'm doing good today. I hope I can go Monday! Can I go Monday?!"   Objective: Pt seen and chart reviewed. Pt is alert and oriented to self, place, and intermittently to time and situation. She is slightly less pressured today although still somewhat intrusive, requiring me to step back several times during the assessment.  Pt denies suicidal/homicidal ideation and psychosis and does not appear to be responding to internal stimuli. She cites an improvement in sleep and appetite yet is minimizing her psychotic symptoms, although they are slightly improved as mentioned above.   Principal Problem: Schizoaffective disorder, bipolar type ([place]) Diagnosis:   Patient Active Problem List   Diagnosis Date Noted  . Noncompliance with treatment [Z91.19] 07/16/2015    Priority: High  . Schizoaffective disorder, bipolar type ([place]) [F25.0] 01/11/2015    Priority: High    Total Time spent with patient: 15 minutes  Past Medical History:  Past Medical History  Diagnosis Date  . Anemia   . Asthma   . Hypertension   . Schizophrenic disorder ([place])   . Seizures ([place])   . Anxiety   . Depression   . Insomnia, persistent   . Bipolar 1 disorder ([place])   . Diabetes mellitus without complication [facility])     Past Surgical History  Procedure Laterality Date  . Tonsillectomy     Family History:  Family History  Problem Relation Age of Onset  . Depression Mother   . Gout Mother   . Alcoholism Other    Social History:  History  Alcohol Use No     History  Drug Use No    Social History   Social History  . Marital Status: Single    Spouse Name: N/A  . Number of Children: N/A  . Years of Education: N/A   Social History Main Topics  . Smoking status: Former Research scientist (life sciences)  . Smokeless tobacco: Never Used  . Alcohol Use: No  . Drug Use: No  . Sexual Activity: No   Other Topics Concern  . None   Social History  Narrative   Additional History:    Sleep: Good  Appetite:  Good   Musculoskeletal: Strength & Muscle Tone: within normal limits Gait & Station: normal Patient leans: N/A   Psychiatric Specialty Exam: Physical Exam  Review of Systems  Cardiovascular: Negative for chest pain and palpitations.  Skin: Negative for itching and rash.  Neurological: Negative for dizziness and tremors.  Psychiatric/Behavioral: Positive for depression. Negative for suicidal ideas. The patient is nervous/anxious and has insomnia.   All other systems reviewed and are negative.   Blood pressure 129/71, pulse 90, temperature 97.9 F (36.6 C), temperature source Oral, resp. rate 20, height '5\' 6"'  (1.676 m), weight 97.07 kg (214 lb), last menstrual period 06/24/2015, SpO2 100 %.Body mass index is 34.56 kg/(m^2).  General Appearance: Casual and Fairly Groomed  Engineer, water::  Good  Speech:  Pressured intermittently  Volume:  Normal  Mood:  Anxious   Affect:  Non-Congruent and Inappropriate  Thought Process:  Disorganized, Loose and Tangential   Orientation:  Full (Time, Place, and Person)  Thought Content:  Paranoid Ideation and Rumination although improving some  Suicidal Thoughts:  No  Homicidal Thoughts:  Denies at this time  Memory:  Immediate;   Fair Recent;   Fair Remote;   Fair  Judgement:  Impaired  Insight:  Shallow  Psychomotor Activity:  Normal  Concentration:  Fair  Recall:  [employer] of Knowledge:Fair  Language: Fair  Akathisia:  No  Handed:  Right  AIMS (if indicated):     Assets:  Communication Skills Desire for Improvement Physical Health  ADL's:  Intact  Cognition: WNL  Sleep:  Number of Hours: 6.75   Current Medications: Current Facility-Administered Medications  Medication Dose Route Frequency Provider Last Rate Last Dose  . acetaminophen (TYLENOL) tablet 650 mg  650 mg Oral Q6H PRN Delfin Gant, NP   650 mg at 07/17/15 0844  . ARIPiprazole (ABILIFY) tablet 5 mg  5  mg Oral BH-qamhs Saramma Eappen, MD   5 mg at 07/18/15 0750  . [START ON 07/19/2015] ARIPiprazole SUSR 400 mg  400 mg Intramuscular Q28 days Ursula Alert, MD      . benztropine (COGENTIN) tablet 0.5 mg  0.5 mg Oral BH-qamhs Saramma Eappen, MD   0.5 mg at 07/18/15 0750  . divalproex (DEPAKOTE ER) 24 hr tablet 750 mg  750 mg Oral QHS Ursula Alert, MD   750 mg at 07/17/15 2209  . hydrOXYzine (ATARAX/VISTARIL) tablet 25 mg  25 mg Oral TID PRN Harriet Butte, NP      . OLANZapine (ZYPREXA) tablet 10 mg  10 mg Oral Q8H PRN Ursula Alert, MD       Or  . OLANZapine (ZYPREXA) injection 10 mg  10 mg Intramuscular Q8H PRN Ursula Alert, MD      . traZODone (DESYREL) tablet 150 mg  150 mg Oral QHS PRN Harriet Butte, NP   150 mg at 07/17/15 2210    Lab Results:  Results for orders placed or performed during the hospital encounter of 07/15/15 (from the past 48 hour(s))  Basic metabolic panel     Status: Abnormal   Collection Time: 07/17/15  6:26 AM  Result Value Ref Range   Sodium 140 135 - 145 mmol/L   Potassium 3.8 3.5 - 5.1 mmol/L   Chloride 106 101 - 111 mmol/L   CO2 24 22 - 32 mmol/L   Glucose, Bld 109 (H) 65 - 99 mg/dL   BUN 10 6 - 20 mg/dL   Creatinine, Ser 0.78 0.44 - 1.00 mg/dL   Calcium 9.6 8.9 - 10.3 mg/dL   GFR calc non Af Amer >60 >60 mL/min   GFR calc Af Amer >60 >60 mL/min    Comment: (NOTE) The eGFR has been calculated using the CKD EPI equation. This calculation has not been validated in all clinical situations. eGFR's persistently <60 mL/min signify possible Chronic Kidney Disease.    Anion gap 10 5 - 15    Comment: Performed at [facility]  Vitamin B12     Status: None   Collection Time: 07/17/15  6:26 AM  Result Value Ref Range   Vitamin B-12 280 180 - 914 pg/mL    Comment: (NOTE) This assay is not validated for testing neonatal or myeloproliferative syndrome specimens for Vitamin B12 levels. Performed at [facility]   Folate      Status: None   Collection Time: 07/17/15  6:26 AM  Result Value Ref Range   Folate 6.7 >5.9 ng/mL    Comment: Performed at [facility]  Iron and TIBC     Status: None   Collection Time: 07/17/15  6:26 AM  Result Value Ref Range   Iron 46 28 - 170 ug/dL   TIBC 393 250 - 450 ug/dL   Saturation Ratios 12 10.4 -  31.8 %   UIBC 347 ug/dL    Comment: Performed at [facility]  Ferritin     Status: None   Collection Time: 07/17/15  6:26 AM  Result Value Ref Range   Ferritin 20 11 - 307 ng/mL    Comment: Performed at [facility]    Physical Findings: AIMS: Facial and Oral Movements Muscles of Facial Expression: None, normal Lips and Perioral Area: None, normal Jaw: None, normal Tongue: None, normal,Extremity Movements Upper (arms, wrists, hands, fingers): None, normal Lower (legs, knees, ankles, toes): None, normal, Trunk Movements Neck, shoulders, hips: None, normal, Overall Severity Severity of abnormal movements (highest score from questions above): None, normal Incapacitation due to abnormal movements: None, normal [name] awareness of abnormal movements (rate only patient's report): No Awareness, Dental Status Current problems with teeth and/or dentures?: No Does patient usually wear dentures?: No  CIWA:  CIWA-Ar Total: 0 COWS:  COWS Total Score: 0  Assessment:  Patient remains psychotic, delusional, disorganized and irritable, although she is steadily improving. She can answer questions appropriately if redirected to do so, but loses thought cohesion as soon as you stop directing the assessment, then becomes tangential and irrelevant with pressured speech. Pt is improving today on 07/18/15 somewhat but is still grossly unaware of her tangential nature and pressured speech.   Treatment Plan Summary: Daily contact with patient to assess and evaluate symptoms and progress in treatment and Medication management  Medications:  -Continue Abilify 65m bid  (was at 384ma couple months ago last admission) -Continue Abilify suspension 40056m28 days, first dose 07/19/15 -Continue Cogentin 0.5mg44md for EPS prophylaxis -Continue Depakote ER 750mg69m for mood stabilization -Continue Trazodone 150mg 37mprn insomnia -Continue vistaril 25mg t84mrn anxiety -Continue Zyprexa 10mg PO83mIM q8h prn for psychosis/agitation excerbation -Will continue to monitor vitals ,medication compliance and treatment side effects while patient is here.  -Closely monitor for medical issues as well as call consult as needed.  -CSW will start working on disposition. Patient to participate in therapeutic milieu .   [place],Benjamine Mola 07/18/2015, 12:15 PM   Agree with NP Progress Note, as above  FernandoNeita Garnet

## 2015-07-18 NOTE — BHH Group Notes (Signed)
[place] Group Notes:  (Nursing/MHT/Case Management/Adjunct)  Date:  07/18/2015  Time:  1:48 PM  Type of Therapy:  Psychoeducational Skills  Participation Level:  Active  Participation Quality:  Appropriate  Affect:  Appropriate  Cognitive:  Appropriate  Insight:  Appropriate  Engagement in Group:  Engaged  Modes of Intervention:  Discussion  Summary of Progress/Problems: Pt did attend self inventory group, pt reported that she was negative SI/HI, no AH/VH noted. Pt rated her depression as a 0, and her helplessness/hopelessness as a 0.     Pt reported no issues or concerns.   [name] 07/18/2015, 1:48 PM

## 2015-07-19 MED ORDER — ARIPIPRAZOLE 10 MG PO TABS
10.0000 mg | ORAL_TABLET | Freq: Every day | ORAL | Status: DC
  Administered 2015-07-19: 10 mg via ORAL
  Filled 2015-07-19 (×2): qty 1

## 2015-07-19 MED ORDER — ARIPIPRAZOLE 5 MG PO TABS
5.0000 mg | ORAL_TABLET | Freq: Every morning | ORAL | Status: DC
  Administered 2015-07-20 – 2015-07-23 (×4): 5 mg via ORAL
  Filled 2015-07-19 (×6): qty 1

## 2015-07-19 NOTE — Progress Notes (Signed)
[facility] MD Progress Note  07/19/2015  [name]  MRN:  [id number] Subjective: Pt states: "I'm fine , I have to go back to work on black Friday .'   Objective: Pt seen and chart reviewed. Pt is alert and oriented to self, place, and intermittently to time and situation. However pt continues to be disorganized , tangential and delusional. She has improved with treatment , but will continue to need medication readjustment. Per nursing - pt continues to not make sense and needs a lot of redirection. Pt has signed a 72 hr discharge application on Saturday- hence will petition to IVC pt since she continues to need inpatient stay.     Principal Problem: Schizoaffective disorder, bipolar type ([place]) Diagnosis:   Patient Active Problem List   Diagnosis Date Noted  . Noncompliance with treatment [Z91.19] 07/16/2015  . Schizoaffective disorder, bipolar type ([place]) [F25.0] 01/11/2015    Total Time spent with patient: 25 minutes  Past Medical History:  Past Medical History  Diagnosis Date  . Anemia   . Asthma   . Hypertension   . Schizophrenic disorder ([place])   . Seizures ([place])   . Anxiety   . Depression   . Insomnia, persistent   . Bipolar 1 disorder ([place])   . Diabetes mellitus without complication [facility])     Past Surgical History  Procedure Laterality Date  . Tonsillectomy     Family History:  Family History  Problem Relation Age of Onset  . Depression Mother   . Gout Mother   . Alcoholism Other    Social History:  History  Alcohol Use No     History  Drug Use No    Social History   Social History  . Marital Status: Single    Spouse Name: N/A  . Number of Children: N/A  . Years of Education: N/A   Social History Main Topics  . Smoking status: Former Research scientist (life sciences)  . Smokeless tobacco: Never Used  . Alcohol Use: No  . Drug Use: No  . Sexual Activity: No   Other Topics Concern  . None   Social History Narrative   Additional History:    Sleep: Fair  Appetite:   Good   Musculoskeletal: Strength & Muscle Tone: within normal limits Gait & Station: normal Patient leans: N/A   Psychiatric Specialty Exam: Physical Exam  Review of Systems  Cardiovascular: Negative for chest pain and palpitations.  Skin: Negative for itching and rash.  Neurological: Negative for dizziness and tremors.  Psychiatric/Behavioral: Positive for depression. Negative for suicidal ideas. The patient is nervous/anxious and has insomnia.   All other systems reviewed and are negative.   Blood pressure 126/88, pulse 85, temperature 98.2 F (36.8 C), temperature source Oral, resp. rate 16, height 5\' 6"  (1.676 m), weight 97.07 kg (214 lb), last menstrual period 06/24/2015, SpO2 100 %.Body mass index is 34.56 kg/(m^2).  General Appearance: Casual and Fairly Groomed  Engineer, water::  Good  Speech:  Pressured intermittently  Volume:  Normal  Mood:  Anxious   Affect:  Inappropriate and Labile  Thought Process:  Disorganized, Loose and Tangential   Orientation:  Full (Time, Place, and Person)  Thought Content:  Paranoid Ideation and Rumination although improving some, talks to self  Suicidal Thoughts:  No  Homicidal Thoughts:  Denies at this time  Memory:  Immediate;   Fair Recent;   Fair Remote;   Fair  Judgement:  Impaired  Insight:  Shallow  Psychomotor Activity:  Normal  Concentration:  Fair  Recall:  [employer] of Knowledge:Fair  Language: Fair  Akathisia:  No  Handed:  Right  AIMS (if indicated):     Assets:  Communication Skills Desire for Improvement Physical Health  ADL's:  Intact  Cognition: WNL  Sleep:  Number of Hours: 6.75   Current Medications: Current Facility-Administered Medications  Medication Dose Route Frequency Provider Last Rate Last Dose  . acetaminophen (TYLENOL) tablet 650 mg  650 mg Oral Q6H PRN Delfin Gant, NP   650 mg at 07/17/15 0844  . ARIPiprazole (ABILIFY) tablet 10 mg  10 mg Oral QHS Ursula Alert, MD      . Derrill Memo ON  07/20/2015] ARIPiprazole (ABILIFY) tablet 5 mg  5 mg Oral q morning - 10a Marvin Maenza, MD      . ARIPiprazole SUSR 400 mg  400 mg Intramuscular Q28 days Ursula Alert, MD   400 mg at 07/19/15 1319  . benztropine (COGENTIN) tablet 0.5 mg  0.5 mg Oral BH-qamhs Hilary Pundt, MD   0.5 mg at 07/19/15 0815  . divalproex (DEPAKOTE ER) 24 hr tablet 750 mg  750 mg Oral QHS Josimar Corning, MD   750 mg at 07/18/15 2227  . hydrOXYzine (ATARAX/VISTARIL) tablet 25 mg  25 mg Oral TID PRN Harriet Butte, NP      . OLANZapine (ZYPREXA) tablet 10 mg  10 mg Oral Q8H PRN Ursula Alert, MD       Or  . OLANZapine (ZYPREXA) injection 10 mg  10 mg Intramuscular Q8H PRN Ursula Alert, MD      . traZODone (DESYREL) tablet 150 mg  150 mg Oral QHS PRN Harriet Butte, NP   150 mg at 07/17/15 2210    Lab Results:  No results found for this or any previous visit (from the past 48 hour(s)).  Physical Findings: AIMS: Facial and Oral Movements Muscles of Facial Expression: None, normal Lips and Perioral Area: None, normal Jaw: None, normal Tongue: None, normal,Extremity Movements Upper (arms, wrists, hands, fingers): None, normal Lower (legs, knees, ankles, toes): None, normal, Trunk Movements Neck, shoulders, hips: None, normal, Overall Severity Severity of abnormal movements (highest score from questions above): None, normal Incapacitation due to abnormal movements: None, normal [name] awareness of abnormal movements (rate only patient's report): No Awareness, Dental Status Current problems with teeth and/or dentures?: No Does patient usually wear dentures?: No  CIWA:  CIWA-Ar Total: 0 COWS:  COWS Total Score: 0  Assessment:  Patient remains psychotic, delusional, disorganized and irritable, although she is improving. Will petition to IVC pt , since she continues to need inpatient stay.   Treatment Plan Summary: Daily contact with patient to assess and evaluate symptoms and progress in treatment  and Medication management  Medications:  -Increase Abilify to 5 mg po qam and 10 mg po qhs for psychosis. -Continue Abilify suspension 400mg  q28 days, first dose 07/19/15 -Continue Cogentin 0.5mg  bid for EPS prophylaxis -Continue Depakote ER 750mg  qhs for mood stabilization. Depakote level on 07/20/15. -Continue Trazodone 150mg  qhs prn insomnia -Continue vistaril 25mg  tid prn anxiety -Continue Zyprexa 10mg  PO or IM q8h prn for psychosis/agitation excerbation -Will continue to monitor vitals ,medication compliance and treatment side effects while patient is here.  -Closely monitor for medical issues as well as call consult as needed.  -CSW will start working on disposition. Patient to participate in therapeutic milieu .   Iyan Flett, MD  07/19/2015, 3;51 PM

## 2015-07-19 NOTE — Progress Notes (Signed)
D: Pt continues to be very flat and depressed on the unit today. Pt also continues to be very disorganized and responds to internal stimuli. Pt reported this morning that she was ready for discharge, and that she just wanted to go home and go back to work. Pt stated " I will come if I start to feel bad again.". Pt reported that her depression was a 0, her hopelessness was a 0, and that her anxiety was a 10. Pt reported being negative SI/HI, no AH/VH noted. A: 15 min checks continued for patient safety. R: Pts safety maintained.

## 2015-07-19 NOTE — BHH Group Notes (Signed)
[place] Group Notes:  (Counselor/Nursing/MHT/Case Management/Adjunct)  07/19/2015 1:15PM  Type of Therapy:  Group Therapy  Participation Level:  Active  Participation Quality:  Appropriate  Affect:  Flat  Cognitive:  Oriented  Insight:  Improving  Engagement in Group:  Limited  Engagement in Therapy:  Limited  Modes of Intervention:  Discussion, Exploration and Socialization  Summary of Progress/Problems: The topic for group was balance in life.  Pt participated in the discussion about when their life was in balance and out of balance and how this feels.  Pt discussed ways to get back in balance and short term goals they can work on to get where they want to be. Disorganized.  Pressured.  Difficult to redirect.  Stayed the entire time.  Likes to give advice to others, but loses her train of thought in the middle, or can't give them specifics.   Trish Mage 07/19/2015 3:37 PM

## 2015-07-20 LAB — VALPROIC ACID LEVEL: VALPROIC ACID LVL: 76 ug/mL (ref 50.0–100.0)

## 2015-07-20 MED ORDER — ARIPIPRAZOLE 15 MG PO TABS
15.0000 mg | ORAL_TABLET | Freq: Every day | ORAL | Status: DC
  Administered 2015-07-20: 15 mg via ORAL
  Filled 2015-07-20 (×3): qty 1

## 2015-07-20 NOTE — Progress Notes (Signed)
Adult Psychoeducational Group Note  Date:  07/20/2015 Time:  8:35 PM  Group Topic/Focus:  Wrap-Up Group:   The focus of this group is to help patients review their daily goal of treatment and discuss progress on daily workbooks.  Participation Level:  Active  Participation Quality:  Appropriate  Affect:  Appropriate and Depressed  Cognitive:  Appropriate  Insight: Appropriate  Engagement in Group:  Engaged  Modes of Intervention:  Discussion  Additional Comments:  Pt rated overall day a 5 out of 10. Pt did not report a specific goal that she had for the day, however, she reported that she was upset about not getting to go home today as she thought she would.   Lincoln Brigham 07/20/2015, 11:26 PM

## 2015-07-20 NOTE — BHH Group Notes (Signed)
[place] LCSW Group Therapy  07/20/2015 1:15 pm  Type of Therapy: Process Group Therapy  Participation Level:  Active  Participation Quality:  Appropriate  Affect:  Flat  Cognitive:  Oriented  Insight:  Improving  Engagement in Group:  Limited  Engagement in Therapy:  Limited  Modes of Intervention:  Activity, Clarification, Education, Problem-solving and Support  Summary of Progress/Problems: Today's group addressed the issue of overcoming obstacles.  Patients were asked to identify their biggest obstacle post d/c that stands in the way of their on-going success, and then problem solve as to how to manage this.  Stayed the entire time.  Continues disorganized, pressured.  However, there was a thread of reality that ran through her comments.  The comments could abstractly be tied to the current theme or topic.  Still difficult to redirect.    Trish Mage 07/20/2015   4:36 PM

## 2015-07-20 NOTE — Progress Notes (Signed)
[facility] MD Progress Note  07/20/2015  [name]  MRN:  [id number] Subjective: Pt states: "I have to stay away from youngsters who do not talk the word of jesus .'   Objective: Pt seen and chart reviewed. Pt is alert and oriented to self, place, and situation. Pt however continues to be disorganized , continues to be delusional , tangential , has intermittent periods when she talks about irrelevant things . Pt however has been improving , tolerating her Abilify well. She has been able to better take care of her ADLs than on admission .She is less anxious than on admission, less restless. Pt has been attempting to participate in group activities , however continues to need a lot of redirection and support. Pt was IVCed yesterday , since she had signed a 72 hr discharge application . Pt continues to require inpatient stay.       Principal Problem: Schizoaffective disorder, bipolar type ([place]) Diagnosis:   Patient Active Problem List   Diagnosis Date Noted  . Noncompliance with treatment [Z91.19] 07/16/2015  . Schizoaffective disorder, bipolar type ([place]) [F25.0] 01/11/2015    Total Time spent with patient: 25 minutes  Past Medical History:  Past Medical History  Diagnosis Date  . Anemia   . Asthma   . Hypertension   . Schizophrenic disorder ([place])   . Seizures ([place])   . Anxiety   . Depression   . Insomnia, persistent   . Bipolar 1 disorder ([place])   . Diabetes mellitus without complication [facility])     Past Surgical History  Procedure Laterality Date  . Tonsillectomy     Family History:  Family History  Problem Relation Age of Onset  . Depression Mother   . Gout Mother   . Alcoholism Other    Social History:  History  Alcohol Use No     History  Drug Use No    Social History   Social History  . Marital Status: Single    Spouse Name: N/A  . Number of Children: N/A  . Years of Education: N/A   Social History Main Topics  . Smoking status: Former Research scientist (life sciences)  .  Smokeless tobacco: Never Used  . Alcohol Use: No  . Drug Use: No  . Sexual Activity: No   Other Topics Concern  . None   Social History Narrative   Additional History:    Sleep: Fair  Appetite:  Good   Musculoskeletal: Strength & Muscle Tone: within normal limits Gait & Station: normal Patient leans: N/A   Psychiatric Specialty Exam: Physical Exam  Review of Systems  Cardiovascular: Negative for chest pain and palpitations.  Skin: Negative for itching and rash.  Neurological: Negative for dizziness and tremors.  Psychiatric/Behavioral: Positive for depression. Negative for suicidal ideas. The patient is nervous/anxious and has insomnia.   All other systems reviewed and are negative.   Blood pressure 122/76, pulse 90, temperature 98.3 F (36.8 C), temperature source Oral, resp. rate 18, height 5\' 6"  (1.676 m), weight 97.07 kg (214 lb), last menstrual period 06/24/2015, SpO2 100 %.Body mass index is 34.56 kg/(m^2).  General Appearance: Casual and Fairly Groomed  Engineer, water::  Good  Speech:  Pressured intermittently  Volume:  Normal  Mood:  Anxious   Affect:  Inappropriate and Labile  Thought Process:  Disorganized, Loose and Tangential   Orientation:  Full (Time, Place, and Person)  Thought Content:  Paranoid Ideation and Rumination although improving some, talks to self  Suicidal Thoughts:  No  Homicidal  Thoughts:  Denies at this time  Memory:  Immediate;   Fair Recent;   Fair Remote;   Fair  Judgement:  Impaired  Insight:  Shallow  Psychomotor Activity:  Normal  Concentration:  Fair  Recall:  [employer] of Knowledge:Fair  Language: Fair  Akathisia:  No  Handed:  Right  AIMS (if indicated):     Assets:  Communication Skills Desire for Improvement Physical Health  ADL's:  Intact  Cognition: WNL  Sleep:  Number of Hours: 5.5   Current Medications: Current Facility-Administered Medications  Medication Dose Route Frequency Provider Last Rate Last Dose   . acetaminophen (TYLENOL) tablet 650 mg  650 mg Oral Q6H PRN Delfin Gant, NP   650 mg at 07/17/15 0844  . ARIPiprazole (ABILIFY) tablet 15 mg  15 mg Oral QHS Joevanni Roddey, MD      . ARIPiprazole (ABILIFY) tablet 5 mg  5 mg Oral q morning - 10a Adelina Collard, MD   5 mg at 07/20/15 1015  . ARIPiprazole SUSR 400 mg  400 mg Intramuscular Q28 days Ursula Alert, MD   400 mg at 07/19/15 1319  . benztropine (COGENTIN) tablet 0.5 mg  0.5 mg Oral BH-qamhs Sahithi Ordoyne, MD   0.5 mg at 07/20/15 0810  . divalproex (DEPAKOTE ER) 24 hr tablet 750 mg  750 mg Oral QHS Rinoa Garramone, MD   750 mg at 07/19/15 2159  . hydrOXYzine (ATARAX/VISTARIL) tablet 25 mg  25 mg Oral TID PRN Harriet Butte, NP      . OLANZapine (ZYPREXA) tablet 10 mg  10 mg Oral Q8H PRN Ursula Alert, MD       Or  . OLANZapine (ZYPREXA) injection 10 mg  10 mg Intramuscular Q8H PRN Ursula Alert, MD      . traZODone (DESYREL) tablet 150 mg  150 mg Oral QHS PRN Harriet Butte, NP   150 mg at 07/17/15 2210    Lab Results:  Results for orders placed or performed during the hospital encounter of 07/15/15 (from the past 48 hour(s))  Valproic acid level     Status: None   Collection Time: 07/20/15  7:00 AM  Result Value Ref Range   Valproic Acid Lvl 76 50.0 - 100.0 ug/mL    Comment: Performed at [facility]    Physical Findings: AIMS: Facial and Oral Movements Muscles of Facial Expression: None, normal Lips and Perioral Area: None, normal Jaw: None, normal Tongue: None, normal,Extremity Movements Upper (arms, wrists, hands, fingers): None, normal Lower (legs, knees, ankles, toes): None, normal, Trunk Movements Neck, shoulders, hips: None, normal, Overall Severity Severity of abnormal movements (highest score from questions above): None, normal Incapacitation due to abnormal movements: None, normal [name] awareness of abnormal movements (rate only patient's report): No Awareness, Dental  Status Current problems with teeth and/or dentures?: No Does patient usually wear dentures?: No  CIWA:  CIWA-Ar Total: 0 COWS:  COWS Total Score: 0  Assessment:  Patient remains psychotic, delusional, disorganized and irritable, although she is improving. Will petition to IVC pt , since she continues to need inpatient stay.   Treatment Plan Summary: Daily contact with patient to assess and evaluate symptoms and progress in treatment and Medication management  Medications:  -Increase Abilify to 5 mg po qam and 15 mg po qhs for psychosis. -Continue Abilify suspension 400mg  q28 days, first dose 07/19/15 -Continue Cogentin 0.5mg  bid for EPS prophylaxis -Continue Depakote ER 750mg  qhs for mood stabilization. Depakote level on 07/20/15- 76  ug/ml. -Continue Trazodone 150mg  qhs prn insomnia -Continue vistaril 25mg  tid prn anxiety -Continue Zyprexa 10mg  PO or IM q8h prn for psychosis/agitation excerbation -Will continue to monitor vitals ,medication compliance and treatment side effects while patient is here.  -Closely monitor for medical issues as well as call consult as needed.  -CSW will start working on disposition. Patient to participate in therapeutic milieu .   Naftula Donahue, MD  07/20/2015,12;05  PM

## 2015-07-20 NOTE — Progress Notes (Signed)
Pt is alert, could however be mildly confused and disorganized; speech is rapid and unable to stay on a topic. Pt was observed in room lying in bed without bedsheets; she states, "I sleep better like this." Pt how is very animated denies depression, anxiety, pain, SI, HI and AVH. Pt was not observed reacting to any form of internal stimuli. Pt was calm and cooperative through the shift assessment.  A: Medications offered as prescribed.  Support, encouragement, and safe environment provided.  15-minute safety checks continue.  R: Pt was med compliant.  Pt attended group. Safety checks continue
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
## 2015-07-20 NOTE — Progress Notes (Signed)
Patient ID: [name], female   DOB: 04-04-1971, 44 y.o.   MRN: [id number] PER STATE REGULATIONS 482.30  THIS CHART WAS REVIEWED FOR MEDICAL NECESSITY WITH RESPECT TO THE [name] ADMISSION/ DURATION OF STAY.  NEXT REVIEW DATE: 07/23/2015   Chauncy Lean, RN, BSN CASE MANAGER

## 2015-07-21 MED ORDER — ARIPIPRAZOLE 10 MG PO TABS
20.0000 mg | ORAL_TABLET | Freq: Every day | ORAL | Status: DC
  Administered 2015-07-21 – 2015-07-22 (×2): 20 mg via ORAL
  Filled 2015-07-21 (×5): qty 2
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
## 2015-07-21 NOTE — Plan of Care (Signed)
Problem: Ineffective individual coping Goal: STG: Patient will remain free from self harm Outcome: Progressing Pt safe on the unit at this time     
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
## 2015-07-21 NOTE — BHH Group Notes (Signed)
[facility] Mental Health Association Group Therapy  07/21/2015 , 1:23 PM    Type of Therapy:  Mental Health Association Presentation  Participation Level:  Active  Participation Quality:  Attentive  Affect:  Blunted  Cognitive:  Oriented  Insight:  Limited  Engagement in Therapy:  Engaged  Modes of Intervention:  Discussion, Education and Socialization  Summary of Progress/Problems:  Shanon Brow from [place] came to present his recovery story and play the guitar.  Engaged throughout.  Interrupted with tangential thoughts, stories periodically.  Stayed the entire time.  Roque Lias B 07/21/2015 , 1:23 PM
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
## 2015-07-21 NOTE — Progress Notes (Signed)
Recreation Therapy Notes  11.23.2016 @ approximately 2:25pm Per MD order LRT met with patient to investigate ways to enhance patient tx. Patient denied need for any services LRT could offer. Patient spoke about wanting to get her "mind to rest" due to patient desires LRT offered stress management, coloring pages, and mandala's. Patient denied all of LRT suggestions. Patietn specifically stated that mandala's were hindu and "I'm not really into that." LRT advised patient on benefit of using mandala's to concentrate and focus, patient brushed off LRT and continued to deny they would be helpful. Patient informed LRT that coloring is childish and she would not participate in coloring. At conclusion of interaction patient reported the only thing she needs is ensure, LRT directed patient to RN for assistance with this.   Laureen Ochs Sidda Humm, LRT/CTRS   Jamarcus Laduke L 07/21/2015 4:12 PM

## 2015-07-21 NOTE — Plan of Care (Signed)
Problem: Diagnosis: Increased Risk For Suicide Attempt Goal: STG-Patient Will Attend All Groups On The Unit Outcome: Progressing Pt attended evening group on 07/20/15.

## 2015-07-21 NOTE — Progress Notes (Signed)
[facility] MD Progress Note  07/21/2015  Koty Lamond  MRN:  [id number] Subjective: Pt states: "I am fine.'    Objective: Pt seen and chart reviewed. Pt is alert and oriented to self, place, and situation. Pt is more organized this AM , than previous days , more focussed on discharge and getting back to work on Friday. However per staff - pt is seen often as delusional , religious , talking in 'tongues " and not making sense. Pt on admission was very disorganized and tangential - is seen as making progress on her current medications. Pt has been attempting to participate in group activities , however continues to need a lot of redirection and support. Pt denies any ADRs of medications.      Principal Problem: Schizoaffective disorder, bipolar type ([place]) Diagnosis:   Patient Active Problem List   Diagnosis Date Noted  . Noncompliance with treatment [Z91.19] 07/16/2015  . Schizoaffective disorder, bipolar type ([place]) [F25.0] 01/11/2015    Total Time spent with patient: 25 minutes  Past Medical History:  Past Medical History  Diagnosis Date  . Anemia   . Asthma   . Hypertension   . Schizophrenic disorder ([place])   . Seizures ([place])   . Anxiety   . Depression   . Insomnia, persistent   . Bipolar 1 disorder ([place])   . Diabetes mellitus without complication [facility])     Past Surgical History  Procedure Laterality Date  . Tonsillectomy     Family History:  Family History  Problem Relation Age of Onset  . Depression Mother   . Gout Mother   . Alcoholism Other    Social History:  History  Alcohol Use No     History  Drug Use No    Social History   Social History  . Marital Status: Single    Spouse Name: N/A  . Number of Children: N/A  . Years of Education: N/A   Social History Main Topics  . Smoking status: Former Research scientist (life sciences)  . Smokeless tobacco: Never Used  . Alcohol Use: No  . Drug Use: No  . Sexual Activity: No   Other Topics Concern  . None   Social History  Narrative   Additional History:    Sleep: Fair  Appetite:  Good   Musculoskeletal: Strength & Muscle Tone: within normal limits Gait & Station: normal Patient leans: N/A   Psychiatric Specialty Exam: Physical Exam  Review of Systems  Cardiovascular: Negative for chest pain and palpitations.  Skin: Negative for itching and rash.  Neurological: Negative for dizziness and tremors.  Psychiatric/Behavioral: Positive for depression. Negative for suicidal ideas. The patient is nervous/anxious and has insomnia.   All other systems reviewed and are negative.   Blood pressure 114/83, pulse 92, temperature 97.6 F (36.4 C), temperature source Oral, resp. rate 16, height 5\' 6"  (1.676 m), weight 97.07 kg (214 lb), last menstrual period 06/24/2015, SpO2 100 %.Body mass index is 34.56 kg/(m^2).  General Appearance: Casual and Fairly Groomed  Engineer, water::  Good  Speech:  Pressured intermittently  Volume:  Normal  Mood:  Anxious   Affect:  Inappropriate and Labile  Thought Process:  Disorganized, Loose and Tangential   Orientation:  Full (Time, Place, and Person)  Thought Content:  Paranoid Ideation and Rumination although improving some, talks to self- talking in tongues per staff.  Suicidal Thoughts:  No  Homicidal Thoughts:  Denies at this time  Memory:  Immediate;   Fair Recent;   Fair Remote;  Fair  Judgement:  Impaired  Insight:  Shallow  Psychomotor Activity:  Normal  Concentration:  Fair  Recall:  [employer] of Knowledge:Fair  Language: Fair  Akathisia:  No  Handed:  Right  AIMS (if indicated):     Assets:  Communication Skills Desire for Improvement Physical Health  ADL's:  Intact  Cognition: WNL  Sleep:  Number of Hours: 6.75   Current Medications: Current Facility-Administered Medications  Medication Dose Route Frequency Provider Last Rate Last Dose  . acetaminophen (TYLENOL) tablet 650 mg  650 mg Oral Q6H PRN Delfin Gant, NP   650 mg at 07/20/15 2135   . ARIPiprazole (ABILIFY) tablet 20 mg  20 mg Oral QHS Rollan Roger, MD      . ARIPiprazole (ABILIFY) tablet 5 mg  5 mg Oral q morning - 10a Shagun Wordell, MD   5 mg at 07/21/15 1050  . ARIPiprazole SUSR 400 mg  400 mg Intramuscular Q28 days Ursula Alert, MD   400 mg at 07/19/15 1319  . benztropine (COGENTIN) tablet 0.5 mg  0.5 mg Oral BH-qamhs Link Burgeson, MD   0.5 mg at 07/21/15 0825  . divalproex (DEPAKOTE ER) 24 hr tablet 750 mg  750 mg Oral QHS Zhuri Krass, MD   750 mg at 07/20/15 2135  . hydrOXYzine (ATARAX/VISTARIL) tablet 25 mg  25 mg Oral TID PRN Harriet Butte, NP      . OLANZapine (ZYPREXA) tablet 10 mg  10 mg Oral Q8H PRN Ursula Alert, MD       Or  . OLANZapine (ZYPREXA) injection 10 mg  10 mg Intramuscular Q8H PRN Ursula Alert, MD      . traZODone (DESYREL) tablet 150 mg  150 mg Oral QHS PRN Harriet Butte, NP   150 mg at 07/20/15 2139    Lab Results:  Results for orders placed or performed during the hospital encounter of 07/15/15 (from the past 48 hour(s))  Valproic acid level     Status: None   Collection Time: 07/20/15  7:00 AM  Result Value Ref Range   Valproic Acid Lvl 76 50.0 - 100.0 ug/mL    Comment: Performed at [facility]    Physical Findings: AIMS: Facial and Oral Movements Muscles of Facial Expression: None, normal Lips and Perioral Area: None, normal Jaw: None, normal Tongue: None, normal,Extremity Movements Upper (arms, wrists, hands, fingers): None, normal Lower (legs, knees, ankles, toes): None, normal, Trunk Movements Neck, shoulders, hips: None, normal, Overall Severity Severity of abnormal movements (highest score from questions above): None, normal Incapacitation due to abnormal movements: None, normal [name] awareness of abnormal movements (rate only patient's report): No Awareness, Dental Status Current problems with teeth and/or dentures?: No Does patient usually wear dentures?: No  CIWA:  CIWA-Ar Total:  0 COWS:  COWS Total Score: 0  Assessment:  Patient remains psychotic, delusional, disorganized and irritable, although she is improving. Patient continues to need inpatient stay.   Treatment Plan Summary: Daily contact with patient to assess and evaluate symptoms and progress in treatment and Medication management  Medications:  -Increase Abilify to 5 mg po qam and 20 mg po qhs for psychosis. -Continue Abilify suspension 400mg  q28 days, first dose 07/19/15 -Continue Cogentin 0.5mg  bid for EPS prophylaxis -Continue Depakote ER 750mg  qhs for mood stabilization. Depakote level on 07/20/15- 76 ug/ml- therapeutic. -Continue Trazodone 150 mg qhs prn insomnia -Continue vistaril 25mg  tid prn anxiety -Continue Zyprexa 10mg  PO or IM q8h prn for psychosis/agitation excerbation -Will  continue to monitor vitals ,medication compliance and treatment side effects while patient is here.  -Closely monitor for medical issues as well as call consult as needed.  -CSW will start working on disposition. Patient to participate in therapeutic milieu .   Duanne Duchesne, MD  07/21/2015,1:43  PM

## 2015-07-21 NOTE — Progress Notes (Signed)
D-  Patient has been noted to be talking to unseen other.  At the medication window patient stated "I don't really need these medications I just need prayer and Jesus.  Patient began to speak in an indistinguishable language before taking her medications and walked away.  Patient denies SI/HI and AVH.  A-  Offered patient medications as prescribed, engage patient in 1:1 therapeutic staff talks, offer patient medications as prescribed.   R-  Patient was able to contract for safety.  Patient remained free of harm to self and others.

## 2015-07-22 NOTE — Progress Notes (Signed)
D: Pt denies SI/HI/AV. Pt is pleasant and cooperative.  A: Pt was offered support and encouragement. Pt was given scheduled medications.  Q 15 minute checks were done for safety.  R:. Pt is taking medication. Pt has no complaints.Pt receptive to treatment and safety maintained on unit.

## 2015-07-22 NOTE — BHH Group Notes (Addendum)
The focus of this group is to educate the patient on the purpose and policies of crisis stabilization and provide a format to answer questions about their admission.  The group details unit policies and expectations of patients while admitted.  Patient did attend 0900 nurse education orientation group this morning.  Patient actively participated and had appropriate affect.  Patient was alert.  Patient had appropriate inisight and appropriate and appropriate engagement.  Today patient will work on 3 goals for discharge.

## 2015-07-22 NOTE — Progress Notes (Signed)
Nursing DAR Note  Patient is cooperative, pleasant, compliant with medications and has had no c/o's thus far this shift. Patient denies any SI/HI/AVH and verbally agrees to Nurse to alert staff prior to engaging in any self harm behavior. Patient did have some sort of disagreement with another patient in the Group Room but patient removed herself from the situation and went to her room.  Q15 minute checks for safety continuous, therapeutic communication and emotional support provided and medications administered as ordered. Nurse initiatiing conversation with patient and patient very receptive and polite; patient is dressed rather bizarrely and readily talks about her "beautiful dress" which appears to be a skirt over a pair of black sweat pants.  Patient remains safe on Unit; Patient cooperative and participative.

## 2015-07-22 NOTE — Plan of Care (Signed)
Problem: Consults Goal: Depression Patient Education See Patient Education Module for education specifics.  Outcome: Progressing Nurse discussed depression/coping skills with patient.        

## 2015-07-22 NOTE — Progress Notes (Signed)
[name] ID: [name], female   DOB: 05-27-1971, 44 y.o.   MRN: [id number] [facility] MD Progress Note  07/22/2015  [name]  MRN:  [id number]  Subjective: Pt states: "I came here because I was sick. I got diabetes due to [facility]".  Objective: Pt seen and chart reviewed. Pt is alert and oriented to self, place, and situation. She is becoming more organized today than previous days, more focussed on discharge and getting back to work on Friday. However per staff - pt is seen often as delusional, hyper-religious, talking in 'tongues " and not making sense. Pt on admission was very disorganized and tangential - is seen as making progress on her current medications. Pt has been attempting to participate in group activities , however continues to need a lot of redirection and support. [name] denies any medication side effects.  Principal Problem: Schizoaffective disorder, bipolar type ([place])  Diagnosis:   [name] Active Problem List   Diagnosis Date Noted  . Noncompliance with treatment [Z91.19] 07/16/2015  . Schizoaffective disorder, bipolar type ([place]) [F25.0] 01/11/2015   Total Time spent with [name]: 25 minutes  Past Medical History:  Past Medical History  Diagnosis Date  . Anemia   . Asthma   . Hypertension   . Schizophrenic disorder ([place])   . Seizures ([place])   . Anxiety   . Depression   . Insomnia, persistent   . Bipolar 1 disorder ([place])   . Diabetes mellitus without complication [facility])     Past Surgical History  Procedure Laterality Date  . Tonsillectomy     Family History:  Family History  Problem Relation Age of Onset  . Depression Mother   . Gout Mother   . Alcoholism Other    Social History:  History  Alcohol Use No     History  Drug Use No    Social History   Social History  . Marital Status: Single    Spouse Name: N/A  . Number of Children: N/A  . Years of Education: N/A   Social History Main Topics  . Smoking status: Former Research scientist (life sciences)  . Smokeless tobacco:  Never Used  . Alcohol Use: No  . Drug Use: No  . Sexual Activity: No   Other Topics Concern  . None   Social History Narrative   Additional History: See H&P  Sleep: "Good"  Appetite:  Good  Musculoskeletal: Strength & Muscle Tone: within normal limits Gait & Station: normal [name] leans: N/A  Psychiatric Specialty Exam: Physical Exam  Review of Systems  Cardiovascular: Negative for chest pain and palpitations.  Skin: Negative for itching and rash.  Neurological: Negative for dizziness and tremors.  Psychiatric/Behavioral: Positive for depression. Negative for suicidal ideas. [name] is nervous/anxious and has insomnia.   All other systems reviewed and are negative.   Blood pressure 115/82, pulse 85, temperature 98.5 F (36.9 C), temperature source Oral, resp. rate 20, height 5\' 6"  (1.676 m), weight 97.07 kg (214 lb), last menstrual period 06/24/2015, SpO2 100 %.Body mass index is 34.56 kg/(m^2).  General Appearance: Casual and Fairly Groomed  Engineer, water::  Good  Speech:  Pressured intermittently  Volume:  Normal  Mood:  Anxious   Affect:  Inappropriate and Labile  Thought Process:  Disorganized, Loose and Tangential   Orientation:  Full (Time, Place, and Person)  Thought Content:  Paranoid Ideation and Rumination although improving some, talks to self- talking in tongues per staff.  Suicidal Thoughts:  No  Homicidal Thoughts:  Denies at this  time  Memory:  Immediate;   Fair Recent;   Fair Remote;   Fair  Judgement:  Impaired  Insight:  Shallow  Psychomotor Activity:  Normal  Concentration:  Fair  Recall:  [employer] of Knowledge:Fair  Language: Fair  Akathisia:  No  Handed:  Right  AIMS (if indicated):     Assets:  Communication Skills Desire for Improvement Physical Health  ADL's:  Intact  Cognition: WNL  Sleep:  Number of Hours: 6.5   Current Medications: Current Facility-Administered Medications  Medication Dose Route Frequency Provider Last  Rate Last Dose  . acetaminophen (TYLENOL) tablet 650 mg  650 mg Oral Q6H PRN Delfin Gant, NP   650 mg at 07/20/15 2135  . ARIPiprazole (ABILIFY) tablet 20 mg  20 mg Oral QHS Ursula Alert, MD   20 mg at 07/21/15 2152  . ARIPiprazole (ABILIFY) tablet 5 mg  5 mg Oral q morning - 10a Ursula Alert, MD   5 mg at 07/22/15 0926  . ARIPiprazole SUSR 400 mg  400 mg Intramuscular Q28 days Ursula Alert, MD   400 mg at 07/19/15 1319  . benztropine (COGENTIN) tablet 0.5 mg  0.5 mg Oral BH-qamhs Saramma Eappen, MD   0.5 mg at 07/22/15 0748  . divalproex (DEPAKOTE ER) 24 hr tablet 750 mg  750 mg Oral QHS Saramma Eappen, MD   750 mg at 07/21/15 2152  . hydrOXYzine (ATARAX/VISTARIL) tablet 25 mg  25 mg Oral TID PRN Harriet Butte, NP      . OLANZapine (ZYPREXA) tablet 10 mg  10 mg Oral Q8H PRN Ursula Alert, MD       Or  . OLANZapine (ZYPREXA) injection 10 mg  10 mg Intramuscular Q8H PRN Ursula Alert, MD      . traZODone (DESYREL) tablet 150 mg  150 mg Oral QHS PRN Harriet Butte, NP   150 mg at 07/21/15 2152    Lab Results:  No results found for this or any previous visit (from Yvette past 48 hour(s)).  Physical Findings: AIMS: Facial and Oral Movements Muscles of Facial Expression: None, normal Lips and Perioral Area: None, normal Jaw: None, normal Tongue: None, normal,Extremity Movements Upper (arms, wrists, hands, fingers): None, normal Lower (legs, knees, ankles, toes): None, normal, Trunk Movements Neck, shoulders, hips: None, normal, Overall Severity Severity of abnormal movements (highest score from questions above): None, normal Incapacitation due to abnormal movements: None, normal [name]'s awareness of abnormal movements (rate only [name]'s report): No Awareness, Dental Status Current problems with teeth and/or dentures?: No Does [name] usually wear dentures?: No  CIWA:  CIWA-Ar Total: 0 COWS:  COWS Total Score: 0  Assessment:  [name] remains psychotic, delusional,  disorganized and irrational, although  improving. [name] continues to need inpatient stay.   Treatment Plan Summary: Daily contact with [name] to assess and evaluate symptoms and progress in treatment and Medication management  Medications:  -Continue Abilify 5 mg po qam and 20 mg po qhs for psychosis. -Continue Abilify suspension 400mg  q28 days, first dose 07/19/15 -Continue Cogentin 0.5mg  bid for EPS prophylaxis -Continue Depakote ER 750mg  qhs for mood stabilization. Depakote level on 07/20/15- 76 ug/ml- therapeutic. -Continue Trazodone 150 mg qhs prn insomnia -Continue vistaril 25mg  tid prn anxiety -Continue Zyprexa 10mg  PO or IM q8h prn for psychosis/agitation excerbation -Will continue to monitor vitals ,medication compliance and treatment side effects while [name] is here.  -Closely monitor for medical issues as well as call consult as needed.  -CSW will start working  on disposition. [name] to participate in therapeutic milieu .   Encarnacion Slates, PMHNP, FNP-BC 07/22/2015,1:43  PM

## 2015-07-23 MED ORDER — BENZTROPINE MESYLATE 0.5 MG PO TABS: 0.5000 mg | ORAL_TABLET | ORAL | Status: DC

## 2015-07-23 MED ORDER — DIVALPROEX SODIUM ER 250 MG PO TB24: 750.0000 mg | ORAL_TABLET | Freq: Every day | ORAL | Status: DC

## 2015-07-23 MED ORDER — ARIPIPRAZOLE 5 MG PO TABS: 5.0000 mg | ORAL_TABLET | Freq: Every morning | ORAL | Status: DC

## 2015-07-23 MED ORDER — ARIPIPRAZOLE 20 MG PO TABS: 20.0000 mg | ORAL_TABLET | Freq: Every day | ORAL | Status: DC

## 2015-07-23 MED ORDER — ARIPIPRAZOLE ER 400 MG IM SUSR: 400.0000 mg | INTRAMUSCULAR | Status: DC

## 2015-07-23 MED ORDER — TRAZODONE HCL 150 MG PO TABS: 150.0000 mg | ORAL_TABLET | Freq: Every evening | ORAL | Status: DC | PRN

## 2015-07-23 NOTE — Progress Notes (Signed)
Patient discharged home with prescriptions. Patient was stable and appreciative at that time. All papers and prescriptions were given and valuables returned. Verbal understanding expressed. Denies SI/HI and A/VH. Patient given opportunity to express concerns and ask questions.

## 2015-07-23 NOTE — Discharge Summary (Signed)
Physician Discharge Summary Note  Patient:  [name] is an 44 y.o., female MRN:  [id number] DOB:  1971/07/02 Patient phone:  [phone] (home)  Patient address:   [place] [address],  Total Time spent with patient: Greater than 30 minutes  Date of Admission:  07/15/2015 Date of Discharge: 07/23/2015  Reason for Admission: Acute Psychosis, Mood stabilization treatments   Principal Problem: Schizoaffective disorder, bipolar type [facility]) Discharge Diagnoses: Patient Active Problem List   Diagnosis Date Noted  . Noncompliance with treatment [Z91.19] 07/16/2015  . Schizoaffective disorder, bipolar type ([place]) [F25.0] 01/11/2015    Musculoskeletal: Strength & Muscle Tone: within normal limits Gait & Station: normal Patient leans: N/A  Psychiatric Specialty Exam:  See Suicide Risk Assessment Physical Exam  Nursing note and vitals reviewed. Constitutional: She is oriented to person, place, and time.  Neck: Normal range of motion.  Respiratory: Effort normal.  Musculoskeletal: Normal range of motion.  Neurological: She is alert and oriented to person, place, and time.    Review of Systems  Constitutional: Negative.   HENT: Negative.   Eyes: Negative.   Respiratory: Negative.   Cardiovascular: Negative.   Gastrointestinal: Negative.   Genitourinary: Negative.   Musculoskeletal: Negative.   Skin: Negative.   Neurological: Negative.   Endo/Heme/Allergies: Negative.   Psychiatric/Behavioral: Negative for suicidal ideas and hallucinations. Depression: Stable. Nervous/anxious: Stable. Insomnia: Stable.   All other systems reviewed and are negative.   Blood pressure 122/82, pulse 89, temperature 98 F (36.7 C), temperature source Oral, resp. rate 18, height 5\' 6"  (1.676 m), weight 97.07 kg (214 lb), last menstrual period 06/24/2015, SpO2 100 %.Body mass index is 34.56 kg/(m^2).  Has this patient used any form of tobacco in the last 30 days? (Cigarettes,  Smokeless Tobacco, Cigars, and/or Pipes) No  Past Medical History:  Past Medical History  Diagnosis Date  . Anemia   . Asthma   . Hypertension   . Schizophrenic disorder ([place])   . Seizures ([place])   . Anxiety   . Depression   . Insomnia, persistent   . Bipolar 1 disorder ([place])   . Diabetes mellitus without complication [facility])     Past Surgical History  Procedure Laterality Date  . Tonsillectomy     Family History:  Family History  Problem Relation Age of Onset  . Depression Mother   . Gout Mother   . Alcoholism Other    Social History:  History  Alcohol Use No     History  Drug Use No    Social History   Social History  . Marital Status: Single    Spouse Name: N/A  . Number of Children: N/A  . Years of Education: N/A   Social History Main Topics  . Smoking status: Former Research scientist (life sciences)  . Smokeless tobacco: Never Used  . Alcohol Use: No  . Drug Use: No  . Sexual Activity: No   Other Topics Concern  . None   Social History Narrative   Per H&P admission note Past Psychiatric History:Yes Diagnosis:Schizophrenia  Hospitalizations:Multiple at [facility]  Outpatient Care:[facility]  Substance Abuse Care:Denies  Self-Mutilation:Denies  Suicidal Attempts:Cut wrist with razor in [place] to Self: Is patient at risk for suicide?: No Risk to Others:   Prior Inpatient Therapy:   Prior Outpatient Therapy:    Level of Care:  OP  Hospital Course:  [name] was admitted for Schizoaffective disorder, bipolar type ([place]) and  crisis management.  She was treated discharged with the medications listed below under Medication List. These included being restarted on her Abilify at 20 mg hs and 5 mg daily. Her Depakote was also restarted for improved mood stability and was at therapeutic level prior to her discharge, which was 76 on 07/20/2015.  Medical problems were identified and treated as needed.  Home medications were restarted as  appropriate.  Improvement was monitored by observation and [name] daily report of symptom reduction. Nursing notes indicated that earlier in her discharge the patient was noted to be disorganized at times, hyper-verbal, observed to be talking to someone who was not there and argumentative at times. Patient is noted to be compliant with medication while in the hospital.  Prior to her discharge the patient received Abilify SUSR 400 mg on 07/19/2015. This was to improve her medication compliance following hospitalization and to reduce rate of readmission.  Emotional and mental status was monitored by daily self-inventory reports completed by [name] and clinical staff.         [name] was evaluated by the treatment team for stability and plans for continued recovery upon discharge.  [name] motivation was an integral factor for scheduling further treatment.  Employment, transportation, bed availability, health status, family support, and any pending legal issues were also considered during her hospital stay.  She was offered further treatment options upon discharge including but not limited to Residential, Intensive Outpatient, and Outpatient treatment.  [name] will follow up with the services as listed below under Follow Up Information.     Upon completion of this admission the patient was both mentally and medically stable for discharge denying suicidal/homicidal ideation, auditory/visual/tactile hallucinations, delusional thoughts and paranoia.      Consults:  psychiatry Iron panel, Depakote level, CBC, Chemistry panel  Significant Diagnostic Studies:    Discharge Vitals:   Blood pressure 122/82, pulse 89, temperature 98 F (36.7 C), temperature source Oral, resp. rate 18, height 5\' 6"  (1.676 m), weight 97.07 kg (214 lb), last menstrual period 06/24/2015, SpO2 100 %. Body mass index is 34.56 kg/(m^2). Lab Results:   No results found for this or any previous visit (from  the past 72 hour(s)).  Physical Findings: AIMS: Facial and Oral Movements Muscles of Facial Expression: None, normal Lips and Perioral Area: None, normal Jaw: None, normal Tongue: None, normal,Extremity Movements Upper (arms, wrists, hands, fingers): None, normal Lower (legs, knees, ankles, toes): None, normal, Trunk Movements Neck, shoulders, hips: None, normal, Overall Severity Severity of abnormal movements (highest score from questions above): None, normal Incapacitation due to abnormal movements: None, normal [name] awareness of abnormal movements (rate only patient's report): No Awareness, Dental Status Current problems with teeth and/or dentures?: No Does patient usually wear dentures?: No  CIWA:  CIWA-Ar Total: 1 COWS:  COWS Total Score: 1   See Psychiatric Specialty Exam and Suicide Risk Assessment completed by Attending Physician prior to discharge.  Discharge destination:  Other:  Group Home  Is patient on multiple antipsychotic therapies at discharge:  Yes,   Do you recommend tapering to monotherapy for antipsychotics?  No   Has Patient had three or more failed trials of antipsychotic monotherapy by history:  No    Recommended Plan for Multiple Antipsychotic Therapies: Additional reason(s) for multiple antispychotic treatment:  Patient  stabilization      Discharge Instructions    Discharge instructions    Complete by:  As directed   Please follow up with your Primary Care  Provider for management of any chronic medical problems after discharge.            Medication List    STOP taking these medications        lithium carbonate 300 MG capsule     meloxicam 15 MG tablet  Commonly known as:  MOBIC      TAKE these medications      Indication   ARIPiprazole 20 MG tablet  Commonly known as:  ABILIFY  Take 1 tablet (20 mg total) by mouth at bedtime.   Indication:  Schizoaffective Disorder     ARIPiprazole 5 MG tablet  Commonly known as:  ABILIFY   Take 1 tablet (5 mg total) by mouth every morning.   Indication:  Schizoaffective Disorder     ARIPiprazole 400 MG Susr  Inject 400 mg into the muscle every 28 (twenty-eight) days.  Start taking on:  08/16/2015   Indication:  Schizoaffective Disorder     benztropine 0.5 MG tablet  Commonly known as:  COGENTIN  Take 1 tablet (0.5 mg total) by mouth 2 (two) times daily in the am and at bedtime.. To prevent side effects from Abilify   Indication:  Extrapyramidal Reaction caused by Medications     budesonide-formoterol 160-4.5 MCG/ACT inhaler  Commonly known as:  SYMBICORT  Inhale 2 puffs into the lungs 2 (two) times daily. For Asthma   Indication:  Asthma     divalproex 250 MG 24 hr tablet  Commonly known as:  DEPAKOTE ER  Take 3 tablets (750 mg total) by mouth at bedtime.   Indication:  Schizoaffective Disorder, Bipolar type     hydrOXYzine 25 MG tablet  Commonly known as:  ATARAX/VISTARIL  Take 1 tablet (25 mg total) by mouth 3 (three) times daily as needed for itching or anxiety (sleep).   Indication:  Anxiety/sleep     traZODone 150 MG tablet  Commonly known as:  DESYREL  Take 1 tablet (150 mg total) by mouth at bedtime as needed for sleep.   Indication:  Trouble Sleeping       Follow-up Information    Follow up with [name].   Why:  Unable to reach ACT team/Closed today possibly due to holiday. Please call ACT team on Monday to inform them of discharge and schedule follow-up appt.    Contact information:   [facility] [address] [place]  [address] Phone: [phone]       Follow-up recommendations:  Activity:  As tolerated Diet:  As tolerated  Comments:   Patient has been instructed to take medications as prescribed; and report adverse effects to outpatient provider.  Follow up with primary doctor for any medical issues and If symptoms recur report to nearest emergency or crisis hot line.    Total  Discharge Time: Greater than 30 minutes  Signed: Elmarie Shiley, NP-C 07/23/2015, 2:48 PM  I have examined the patient and agree with the discharge plan and findings. I also have done suicide assessment on this patient.

## 2015-07-23 NOTE — Progress Notes (Signed)
  [facility] Adult Case Management Discharge Plan :  Will you be returning to the same living situation after discharge:  Yes,  home At discharge, do you have transportation home?: Yes,  bus Do you have the ability to pay for your medications: Yes,  no barriers reported  Release of information consent forms completed and in the chart;  [name] signature needed at discharge.  Patient to Follow up at: Follow-up Information    Follow up with [name].   Why:  Unable to reach ACT team/Closed today possibly due to holiday. Please call ACT team on Monday to inform them of discharge and schedule follow-up appt.    Contact information:   [facility] [address] [place]  [address] Phone: [phone]       Next level of care provider has access to [facility] Link:no  Patient denies SI/HI: Yes,  refer to MD SRA at discharge    Safety Planning and Suicide Prevention discussed: Yes,  with patient  Have you used any form of tobacco in the last 30 days? (Cigarettes, Smokeless Tobacco, Cigars, and/or Pipes): No  Has patient been referred to the Quitline?: N/A patient is not a smoker  [name] 07/23/2015, 12:15 PM

## 2015-07-23 NOTE — Progress Notes (Signed)
CSW telephoned patient's mother (Ms. Windish [phone]) to determine if she can provide transportation for patient at time of discharge and to verify outpatient providers. CSW left voicemail requesting mother to return phone call.

## 2015-07-23 NOTE — Progress Notes (Signed)
CSW telephoned patient's mother (Ms. Nathanson [phone]) to inform her about discharge. [name] mother stated that she will pick patient up at 1:30pm and for patient not to ride the bus.   CSW to notify RN.   [name], Trust [place] C

## 2015-07-23 NOTE — BHH Suicide Risk Assessment (Signed)
[facility] Discharge Suicide Risk Assessment   Demographic Factors:  Low socioeconomic status, Living alone and female  Total Time spent with patient: 30 minutes  Musculoskeletal: Strength & Muscle Tone: within normal limits Gait & Station: normal Patient leans: none  Psychiatric Specialty Exam: Physical Exam  Constitutional: She appears well-developed.  HENT:  Head: Normocephalic.  Skin: She is not diaphoretic.    Review of Systems  Constitutional: Negative for fever.  Cardiovascular: Negative for chest pain.  Skin: Negative for rash.  Psychiatric/Behavioral: Negative for depression and suicidal ideas.    Blood pressure 122/82, pulse 89, temperature 98 F (36.7 C), temperature source Oral, resp. rate 18, height 5\' 6"  (1.676 m), weight 97.07 kg (214 lb), last menstrual period 06/24/2015, SpO2 100 %.Body mass index is 34.56 kg/(m^2).  General Appearance: Casual  Eye Contact::  Fair  Speech:  Slow409  Volume:  Normal  Mood:  Euthymic  Affect:  Constricted  Thought Process:  Coherent  Orientation:  Full (Time, Place, and Person)  Thought Content:  Rumination  Suicidal Thoughts:  No  Homicidal Thoughts:  No  Memory:  Immediate;   Fair Recent;   Fair  Judgement:  Fair  Insight:  Shallow  Psychomotor Activity:  Normal  Concentration:  Fair  Recall:  [employer] of Knowledge:Fair  Language: Fair  Akathisia:  Negative  Handed:  Right  AIMS (if indicated):     Assets:  Desire for Improvement  Sleep:  Number of Hours: 4  Cognition: WNL  ADL's:  Intact   Have you used any form of tobacco in the last 30 days? (Cigarettes, Smokeless Tobacco, Cigars, and/or Pipes): No  Has this patient used any form of tobacco in the last 30 days? (Cigarettes, Smokeless Tobacco, Cigars, and/or Pipes) N/A  Mental Status Per Nursing Assessment::   On Admission:  NA  Current Mental Status by Physician: see MSE  Loss Factors: Financial problems/change in socioeconomic status  Historical  Factors: Impulsivity  Risk Reduction Factors:   Positive therapeutic relationship and Positive coping skills or problem solving skills  Continued Clinical Symptoms:  Schizophrenia:   Paranoid or undifferentiated type More than one psychiatric diagnosis  Paranoia now controlled on medications.  Patient not bizarre or disorganized.  Cognitive Features That Contribute To Risk:  Closed-mindedness    Suicide Risk:  Minimal: No identifiable suicidal ideation.  Patients presenting with no risk factors but with morbid ruminations; may be classified as minimal risk based on the severity of the depressive symptoms  Principal Problem: Schizoaffective disorder, bipolar type [facility]) Discharge Diagnoses:  Patient Active Problem List   Diagnosis Date Noted  . Noncompliance with treatment [Z91.19] 07/16/2015  . Schizoaffective disorder, bipolar type ([place]) [F25.0] 01/11/2015      Plan Of Care/Follow-up recommendations:  Activity:  as tolerated Diet:  regular See Discharge summary for details. Follow up with recommendations and medication compliance including appointments.  Is patient on multiple antipsychotic therapies at discharge:  No   Has Patient had three or more failed trials of antipsychotic monotherapy by history:  No  Recommended Plan for Multiple Antipsychotic Therapies: NA    [name] 07/23/2015, 9:53 AM

## 2015-07-23 NOTE — Plan of Care (Signed)
Problem: Ineffective individual coping Goal: STG: Patient will remain free from self harm Outcome: Progressing Denies SI at this time.

## 2015-07-23 NOTE — BHH Group Notes (Signed)
[facility] LCSW Aftercare Discharge Planning Group Note   07/23/2015 9:41 AM  Participation Quality:  Attentive  Mood/Affect:  Depressed and Flat  Depression Rating:  10  Anxiety Rating:  0  Thoughts of Suicide:  No Will you contract for safety?   Yes  Current AVH:  No  Plan for Discharge/Comments:  Patient reports that she is currently seeing a psychologist named Harrell Gave off of [address] and [address]. Patient states that she may follow up with [facility] for her medications upon discharge.   Transportation Means: Patient will need bus passes for assistance.   Supports: None   [name], Kwinton Maahs C

## 2015-07-23 NOTE — Tx Team (Signed)
Interdisciplinary Treatment Plan Update (Adult)  Date:  07/23/2015 Time Reviewed:  10:29 AM  Progress in Treatment: Attending groups: Yes. Participating in groups: Yes. Taking medication as prescribed:  Yes. Tolerating medication:  Yes. Family/Significant othe contact made:  Yes mother Patient understands diagnosis:  No  Limited insight Discussing patient identified problems/goals with staff:  Yes, see initial care plan. Medical problems stabilized or resolved:  Yes Denies suicidal/homicidal ideation: Yes. Issues/concerns per patient self-inventory: No. Other:  New problem(s) identified:   Discharge Plan or Barriers: See below   Reason for Continuation of Hospitalization: none  Comments: [name] is a 44 y.o.AA femalewho has a hx of schizoaffective disorder , is single , lives with her uncle ( per pt report) in [place] , who presented to [facility] due to increasing depression. Per initial notes in EHR " Pt has not had her Abilify since July and stated that she is having worsening paranoid delusions and does not feel safe. Patient has noted similar and previous episodes of being off medications. [name] psychiatric needs started back in 1993. Patient has flight of ideas speaking of various topics including her uncle whom she believes is doing drugs and trying to hurt her. Patient also stating that her son is asking for sexual favors and believes various people are out trying to get her. " Abilify, Congentin, Depakote ER trial  Estimated length of stay: D/C Sat if stable  Review of initial/current patient goals per problem list:  1. Goal(s): Patient will participate in aftercare plan  Met: Yes   Target date: at discharge  As evidenced by: Patient will participate within aftercare plan AEB aftercare provider and housing plan at discharge being identified. 07/16/15: Pt will return home and follow-up outpt with ACT team..   2. Goal (s): Patient will demonstrate  decreased signs of mania  * Met: Yes  * Target date: 3-5 days post admission date  * As evidenced by: Patient demonstrate decreased signs of mania AEB decreased mood instability and demonstration of stable mood  07/16/15: Pt is still endorsing AVH, presents with pressured speech, flight of ideas, and disorganized thought process. 07/21/15:  Willing to take meds, including injection.  More easily redirectable.  Less pressured and reduced flight of ideas.  Still tangential  11/25: Pt demonstrating reduction in mania, flight of ideas and pressured speech. She appears to be presenting at her baseline.     Attendees: Patient:  07/23/2015 10:29 AM  Family:   07/23/2015 10:29 AM  Physician:  Dr. Nicole Cella  07/23/2015 10:29 AM  Nursing:  Hedy Jacob, RN , Patrice RN, [facility] RN 07/23/2015 10:29 AM  Case Manager:  Maxie Better, LCSW 07/23/2015 10:29 AM  Counselor:  Marcina Millard, LCSW 07/23/2015 10:29 AM  Other:  Agustina Caroli, NP 07/23/2015 10:29 AM  Other:   07/23/2015 10:29 AM  Other:   07/23/2015 10:29 AM  Other:  07/23/2015 10:29 AM  Other:    Other:    Other:    Other:    Other:    Other:      Scribe for Treatment Team:   Maxie Better, MSW, LCSW Clinical Social Worker 07/23/2015 10:31 AM

## 2015-08-23 ENCOUNTER — Emergency Department (HOSPITAL_COMMUNITY)
Admission: EM | Admit: 2015-08-23 | Discharge: 2015-08-25 | Payer: Medicare Other | Attending: Emergency Medicine | Admitting: Emergency Medicine

## 2015-08-23 ENCOUNTER — Encounter (HOSPITAL_COMMUNITY): Payer: Self-pay | Admitting: Emergency Medicine

## 2015-08-23 DIAGNOSIS — F250 Schizoaffective disorder, bipolar type: Secondary | ICD-10-CM | POA: Diagnosis not present

## 2015-08-23 DIAGNOSIS — J45909 Unspecified asthma, uncomplicated: Secondary | ICD-10-CM | POA: Diagnosis not present

## 2015-08-23 DIAGNOSIS — R21 Rash and other nonspecific skin eruption: Secondary | ICD-10-CM | POA: Insufficient documentation

## 2015-08-23 DIAGNOSIS — Z3202 Encounter for pregnancy test, result negative: Secondary | ICD-10-CM | POA: Insufficient documentation

## 2015-08-23 DIAGNOSIS — F419 Anxiety disorder, unspecified: Secondary | ICD-10-CM | POA: Diagnosis not present

## 2015-08-23 DIAGNOSIS — Z862 Personal history of diseases of the blood and blood-forming organs and certain disorders involving the immune mechanism: Secondary | ICD-10-CM | POA: Insufficient documentation

## 2015-08-23 DIAGNOSIS — Z8669 Personal history of other diseases of the nervous system and sense organs: Secondary | ICD-10-CM | POA: Insufficient documentation

## 2015-08-23 DIAGNOSIS — I10 Essential (primary) hypertension: Secondary | ICD-10-CM | POA: Insufficient documentation

## 2015-08-23 DIAGNOSIS — E119 Type 2 diabetes mellitus without complications: Secondary | ICD-10-CM | POA: Insufficient documentation

## 2015-08-23 DIAGNOSIS — Z7951 Long term (current) use of inhaled steroids: Secondary | ICD-10-CM | POA: Insufficient documentation

## 2015-08-23 DIAGNOSIS — Z87891 Personal history of nicotine dependence: Secondary | ICD-10-CM | POA: Diagnosis not present

## 2015-08-23 DIAGNOSIS — F209 Schizophrenia, unspecified: Secondary | ICD-10-CM | POA: Insufficient documentation

## 2015-08-23 DIAGNOSIS — F319 Bipolar disorder, unspecified: Secondary | ICD-10-CM | POA: Insufficient documentation

## 2015-08-23 DIAGNOSIS — Z880 Allergy status to penicillin: Secondary | ICD-10-CM | POA: Diagnosis not present

## 2015-08-23 DIAGNOSIS — F29 Unspecified psychosis not due to a substance or known physiological condition: Secondary | ICD-10-CM | POA: Insufficient documentation

## 2015-08-23 DIAGNOSIS — Z79899 Other long term (current) drug therapy: Secondary | ICD-10-CM | POA: Insufficient documentation

## 2015-08-23 DIAGNOSIS — Z046 Encounter for general psychiatric examination, requested by authority: Secondary | ICD-10-CM | POA: Diagnosis present

## 2015-08-23 LAB — RAPID URINE DRUG SCREEN, HOSP PERFORMED
AMPHETAMINES: NOT DETECTED
BARBITURATES: NOT DETECTED
Benzodiazepines: NOT DETECTED
Cocaine: NOT DETECTED
OPIATES: NOT DETECTED
TETRAHYDROCANNABINOL: NOT DETECTED

## 2015-08-23 LAB — COMPREHENSIVE METABOLIC PANEL
ALK PHOS: 54 U/L (ref 38–126)
ALT: 17 U/L (ref 14–54)
AST: 19 U/L (ref 15–41)
Albumin: 4.2 g/dL (ref 3.5–5.0)
Anion gap: 10 (ref 5–15)
BUN: 12 mg/dL (ref 6–20)
CALCIUM: 9.5 mg/dL (ref 8.9–10.3)
CHLORIDE: 106 mmol/L (ref 101–111)
CO2: 24 mmol/L (ref 22–32)
CREATININE: 0.78 mg/dL (ref 0.44–1.00)
GFR calc non Af Amer: >60 mL/min (ref >60)
GLUCOSE: 97 mg/dL (ref 65–99)
Potassium: 3.8 mmol/L (ref 3.5–5.1)
SODIUM: 140 mmol/L (ref 135–145)
Total Bilirubin: 0.3 mg/dL (ref 0.3–1.2)
Total Protein: 7.4 g/dL (ref 6.5–8.1)

## 2015-08-23 LAB — CBC
HEMATOCRIT: 36.9 % (ref 36.0–46.0)
HEMOGLOBIN: 12.1 g/dL (ref 12.0–15.0)
MCH: 29.7 pg (ref 26.0–34.0)
MCHC: 32.8 g/dL (ref 30.0–36.0)
MCV: 90.7 fL (ref 78.0–100.0)
Platelets: 259 10*3/uL (ref 150–400)
RBC: 4.07 MIL/uL (ref 3.87–5.11)
RDW: 12.8 % (ref 11.5–15.5)
WBC: 6.2 10*3/uL (ref 4.0–10.5)

## 2015-08-23 LAB — ACETAMINOPHEN LEVEL: Acetaminophen (Tylenol), Serum: <10 ug/mL — ABNORMAL LOW (ref 10–30)

## 2015-08-23 LAB — SALICYLATE LEVEL: Salicylate Lvl: <4 mg/dL (ref 2.8–30.0)

## 2015-08-23 LAB — HCG, QUANTITATIVE, PREGNANCY: hCG, Beta Chain, Quant, S: <1 m[IU]/mL (ref <5)

## 2015-08-23 LAB — ETHANOL: Alcohol, Ethyl (B): <5 mg/dL (ref <5)

## 2015-08-23 MED ORDER — ARIPIPRAZOLE ER 400 MG IM SUSR: 400.0000 mg | INTRAMUSCULAR | Status: DC

## 2015-08-23 MED ORDER — ALUM & MAG HYDROXIDE-SIMETH 200-200-20 MG/5ML PO SUSP: 30.0000 mL | ORAL | Status: DC | PRN

## 2015-08-23 MED ORDER — LORAZEPAM 1 MG PO TABS: 1.0000 mg | ORAL_TABLET | Freq: Three times a day (TID) | ORAL | Status: DC | PRN

## 2015-08-23 MED ORDER — DIVALPROEX SODIUM ER 500 MG PO TB24
750.0000 mg | ORAL_TABLET | Freq: Every day | ORAL | Status: DC
  Administered 2015-08-24 (×2): 750 mg via ORAL
  Filled 2015-08-23 (×4): qty 1

## 2015-08-23 MED ORDER — ZOLPIDEM TARTRATE 5 MG PO TABS: 5.0000 mg | ORAL_TABLET | Freq: Every evening | ORAL | Status: DC | PRN

## 2015-08-23 MED ORDER — IBUPROFEN 200 MG PO TABS
600.0000 mg | ORAL_TABLET | Freq: Three times a day (TID) | ORAL | Status: DC | PRN
  Administered 2015-08-24: 600 mg via ORAL
  Filled 2015-08-23: qty 3

## 2015-08-23 MED ORDER — BUDESONIDE-FORMOTEROL FUMARATE 160-4.5 MCG/ACT IN AERO
2.0000 | INHALATION_SPRAY | Freq: Two times a day (BID) | RESPIRATORY_TRACT | Status: DC
  Administered 2015-08-24 – 2015-08-25 (×3): 2 via RESPIRATORY_TRACT
  Filled 2015-08-23 (×2): qty 6

## 2015-08-23 MED ORDER — ONDANSETRON HCL 4 MG PO TABS: 4.0000 mg | ORAL_TABLET | Freq: Three times a day (TID) | ORAL | Status: DC | PRN

## 2015-08-23 MED ORDER — HYDROXYZINE HCL 25 MG PO TABS
25.0000 mg | ORAL_TABLET | Freq: Three times a day (TID) | ORAL | Status: DC | PRN
  Administered 2015-08-24: 25 mg via ORAL
  Filled 2015-08-23: qty 1

## 2015-08-23 MED ORDER — ACETAMINOPHEN 325 MG PO TABS
650.0000 mg | ORAL_TABLET | ORAL | Status: DC | PRN
  Administered 2015-08-24: 650 mg via ORAL
  Filled 2015-08-23: qty 2

## 2015-08-23 MED ORDER — TRAZODONE HCL 50 MG PO TABS
150.0000 mg | ORAL_TABLET | Freq: Every evening | ORAL | Status: DC | PRN
  Administered 2015-08-24: 150 mg via ORAL
  Filled 2015-08-23: qty 1

## 2015-08-23 MED ORDER — BENZTROPINE MESYLATE 1 MG PO TABS
0.5000 mg | ORAL_TABLET | ORAL | Status: DC
  Administered 2015-08-24 – 2015-08-25 (×3): 0.5 mg via ORAL
  Filled 2015-08-23 (×3): qty 1
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
## 2015-08-23 NOTE — ED Notes (Signed)
Telepsych in process 
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
## 2015-08-24 DIAGNOSIS — F29 Unspecified psychosis not due to a substance or known physiological condition: Secondary | ICD-10-CM | POA: Diagnosis not present

## 2015-08-24 DIAGNOSIS — F250 Schizoaffective disorder, bipolar type: Secondary | ICD-10-CM

## 2015-08-24 MED ORDER — ARIPIPRAZOLE 5 MG PO TABS
5.0000 mg | ORAL_TABLET | Freq: Every day | ORAL | Status: DC
  Administered 2015-08-24 – 2015-08-25 (×2): 5 mg via ORAL
  Filled 2015-08-24 (×2): qty 1

## 2015-08-24 MED ORDER — ARIPIPRAZOLE 10 MG PO TABS
20.0000 mg | ORAL_TABLET | Freq: Every day | ORAL | Status: DC
  Administered 2015-08-24 (×2): 20 mg via ORAL
  Filled 2015-08-24 (×4): qty 2
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
## 2015-08-24 NOTE — ED Notes (Signed)
Pt changed back into paper scrubs  Pt taken back to TCU, 2 pt belonging bags and 1 cane taken to TCU as well.
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
## 2015-08-24 NOTE — BH Assessment (Signed)
[place] Assessment Progress Note  The following facilities have been contacted to seek placement for this pt, with results as noted:  Beds available, information sent, decision pending:  [facility]   At capacity:  [place], [place] Triage Specialist [phone]

## 2015-08-24 NOTE — ED Notes (Signed)
2 pt belonging bags and walking cane at nurses station by room 15

## 2015-08-24 NOTE — ED Notes (Addendum)
Patient has been pleasant and cooperative since arriving on the unit.  She has stayed in her room most of the day, but has been out to the dayroom and interacting with peers.  Her appetite is good and she is attending to hygiene.  She will be transferred to [facility] after 19:30 and is looking forward to getting out of the ED.

## 2015-08-24 NOTE — ED Notes (Signed)
Bed: [id number] Expected date:  Expected time:  Means of arrival:  Comments: rm 15

## 2015-08-24 NOTE — ED Notes (Signed)
Pt alert and oriented x4. Respirations even and unlabored, bilateral symmetrical rise and fall of chest. Skin warm and dry. In no acute distress. Denies needs.   
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
## 2015-08-24 NOTE — ED Notes (Signed)
Pt ambulated independently from room 15 to bathroom by room 20.   Pt took off paper scrubs and put on hospital gown.

## 2015-08-25 DIAGNOSIS — F29 Unspecified psychosis not due to a substance or known physiological condition: Secondary | ICD-10-CM | POA: Diagnosis not present

## 2015-08-25 MED ORDER — OXCARBAZEPINE 300 MG PO TABS
600.0000 mg | ORAL_TABLET | Freq: Two times a day (BID) | ORAL | Status: DC
  Administered 2015-08-25: 600 mg via ORAL
  Filled 2015-08-25 (×2): qty 2

## 2015-08-25 MED ORDER — BENZTROPINE MESYLATE 1 MG PO TABS: 1.0000 mg | ORAL_TABLET | Freq: Two times a day (BID) | ORAL | Status: DC

## 2015-08-25 MED ORDER — PALIPERIDONE ER 6 MG PO TB24
6.0000 mg | ORAL_TABLET | Freq: Every day | ORAL | Status: DC
  Administered 2015-08-25: 6 mg via ORAL
  Filled 2015-08-25: qty 1

## 2015-08-25 NOTE — BH Assessment (Signed)
Per [name], patient accepted to [name]. The accepted provider is Dr. Jake Samples. Nursing report [phone]. [name] has also asked that our nursing staff to provide updated vitals during the nursing report. Patient must also be IVC'd for admission to [facility]. Dr. Darleene Cleaver agrees to initiate IVC.

## 2015-08-25 NOTE — BH Assessment (Signed)
[place] Assessment Progress Note  Per Corena Pilgrim, MD, this pt meets criteria for IVC and he has initiated petition.  Petition and 1st Examination have been faxed to [facility], and at [employer] confirms receipt.  As of this writing, service of Findings and Custody Order is pending.  Jalene Mullet, [place] Triage Specialist [phone]

## 2015-08-25 NOTE — ED Notes (Signed)
Pt is in her room saying that her family "they can't handle me." She is talking about going to an assisted living saying that she has lived in group home before. Pt has two children ages 61 and 39. Pt says that she had a job at [employer] and that it ended. When asked about her children she appears confused about who takes care of them. Pt reports that her father said she could move out. She reports being the payee for her children and says that her children are not with her at her father's. Pt says that her mother does not take care of her children and said that they are at her mother's address. Pt denies si and hi. Pt denies hallucinations. Safety maintained on the unit.

## 2015-08-26 DIAGNOSIS — I10 Essential (primary) hypertension: Secondary | ICD-10-CM | POA: Insufficient documentation

## 2015-08-26 DIAGNOSIS — R569 Unspecified convulsions: Secondary | ICD-10-CM | POA: Insufficient documentation

## 2015-08-26 DIAGNOSIS — R7303 Prediabetes: Secondary | ICD-10-CM | POA: Insufficient documentation

## 2015-09-11 ENCOUNTER — Encounter (HOSPITAL_COMMUNITY): Payer: Self-pay | Admitting: Emergency Medicine

## 2015-09-11 ENCOUNTER — Emergency Department (HOSPITAL_COMMUNITY)
Admission: EM | Admit: 2015-09-11 | Discharge: 2015-09-11 | Disposition: A | Discharge status: Home / Self Care | Payer: Medicare Other | Attending: Emergency Medicine | Admitting: Emergency Medicine

## 2015-09-11 DIAGNOSIS — J45909 Unspecified asthma, uncomplicated: Secondary | ICD-10-CM | POA: Insufficient documentation

## 2015-09-11 DIAGNOSIS — G4700 Insomnia, unspecified: Secondary | ICD-10-CM | POA: Insufficient documentation

## 2015-09-11 DIAGNOSIS — F309 Manic episode, unspecified: Secondary | ICD-10-CM | POA: Diagnosis not present

## 2015-09-11 DIAGNOSIS — Z7951 Long term (current) use of inhaled steroids: Secondary | ICD-10-CM | POA: Diagnosis not present

## 2015-09-11 DIAGNOSIS — Z87891 Personal history of nicotine dependence: Secondary | ICD-10-CM | POA: Diagnosis not present

## 2015-09-11 DIAGNOSIS — Z880 Allergy status to penicillin: Secondary | ICD-10-CM | POA: Insufficient documentation

## 2015-09-11 DIAGNOSIS — F419 Anxiety disorder, unspecified: Secondary | ICD-10-CM | POA: Insufficient documentation

## 2015-09-11 DIAGNOSIS — R079 Chest pain, unspecified: Secondary | ICD-10-CM | POA: Insufficient documentation

## 2015-09-11 DIAGNOSIS — E119 Type 2 diabetes mellitus without complications: Secondary | ICD-10-CM | POA: Diagnosis not present

## 2015-09-11 DIAGNOSIS — I10 Essential (primary) hypertension: Secondary | ICD-10-CM | POA: Insufficient documentation

## 2015-09-11 DIAGNOSIS — Z862 Personal history of diseases of the blood and blood-forming organs and certain disorders involving the immune mechanism: Secondary | ICD-10-CM | POA: Insufficient documentation

## 2015-09-11 DIAGNOSIS — F250 Schizoaffective disorder, bipolar type: Secondary | ICD-10-CM | POA: Diagnosis present

## 2015-09-11 DIAGNOSIS — F3010 Manic episode without psychotic symptoms, unspecified: Secondary | ICD-10-CM

## 2015-09-11 LAB — COMPREHENSIVE METABOLIC PANEL
ALBUMIN: 4.3 g/dL (ref 3.5–5.0)
ALK PHOS: 45 U/L (ref 38–126)
ALT: 16 U/L (ref 14–54)
ANION GAP: 11 (ref 5–15)
AST: 17 U/L (ref 15–41)
BILIRUBIN TOTAL: 1.2 mg/dL (ref 0.3–1.2)
BUN: 14 mg/dL (ref 6–20)
CHLORIDE: 107 mmol/L (ref 101–111)
CO2: 22 mmol/L (ref 22–32)
CREATININE: 0.62 mg/dL (ref 0.44–1.00)
Calcium: 9.2 mg/dL (ref 8.9–10.3)
GFR calc non Af Amer: >60 mL/min (ref >60)
GLUCOSE: 97 mg/dL (ref 65–99)
Potassium: 3.5 mmol/L (ref 3.5–5.1)
Sodium: 140 mmol/L (ref 135–145)
Total Protein: 7.4 g/dL (ref 6.5–8.1)

## 2015-09-11 LAB — RAPID URINE DRUG SCREEN, HOSP PERFORMED
AMPHETAMINES: NOT DETECTED
BENZODIAZEPINES: NOT DETECTED
Barbiturates: NOT DETECTED
COCAINE: NOT DETECTED
OPIATES: NOT DETECTED
Tetrahydrocannabinol: NOT DETECTED

## 2015-09-11 LAB — CBC
HCT: 34.4 % — ABNORMAL LOW (ref 36.0–46.0)
Hemoglobin: 11.3 g/dL — ABNORMAL LOW (ref 12.0–15.0)
MCH: 28.8 pg (ref 26.0–34.0)
MCHC: 32.8 g/dL (ref 30.0–36.0)
MCV: 87.8 fL (ref 78.0–100.0)
PLATELETS: 220 10*3/uL (ref 150–400)
RBC: 3.92 MIL/uL (ref 3.87–5.11)
RDW: 13.3 % (ref 11.5–15.5)
WBC: 4.4 10*3/uL (ref 4.0–10.5)

## 2015-09-11 LAB — ETHANOL: Alcohol, Ethyl (B): 5 mg/dL — ABNORMAL HIGH (ref <5)

## 2015-09-11 LAB — VALPROIC ACID LEVEL: Valproic Acid Lvl: <10 ug/mL — ABNORMAL LOW (ref 50.0–100.0)

## 2015-09-11 LAB — SALICYLATE LEVEL

## 2015-09-11 LAB — ACETAMINOPHEN LEVEL

## 2015-09-11 MED ORDER — ARIPIPRAZOLE 20 MG PO TABS: 20.0000 mg | ORAL_TABLET | Freq: Every day | ORAL | Status: DC

## 2015-09-11 MED ORDER — BENZTROPINE MESYLATE 0.5 MG PO TABS: 0.5000 mg | ORAL_TABLET | ORAL | Status: DC

## 2015-09-11 MED ORDER — BENZTROPINE MESYLATE 1 MG PO TABS
0.5000 mg | ORAL_TABLET | Freq: Two times a day (BID) | ORAL | Status: DC
  Administered 2015-09-11: 1 mg via ORAL
  Filled 2015-09-11: qty 1

## 2015-09-11 MED ORDER — ONDANSETRON HCL 4 MG PO TABS: 4.0000 mg | ORAL_TABLET | Freq: Three times a day (TID) | ORAL | Status: DC | PRN

## 2015-09-11 MED ORDER — DIVALPROEX SODIUM ER 500 MG PO TB24: 500.0000 mg | ORAL_TABLET | Freq: Two times a day (BID) | ORAL | Status: DC

## 2015-09-11 MED ORDER — DIVALPROEX SODIUM ER 500 MG PO TB24
500.0000 mg | ORAL_TABLET | Freq: Every day | ORAL | Status: DC
  Administered 2015-09-11: 500 mg via ORAL
  Filled 2015-09-11: qty 1

## 2015-09-11 MED ORDER — HYDROXYZINE HCL 25 MG PO TABS: 25.0000 mg | ORAL_TABLET | Freq: Three times a day (TID) | ORAL | Status: DC | PRN

## 2015-09-11 MED ORDER — ARIPIPRAZOLE 10 MG PO TABS
20.0000 mg | ORAL_TABLET | Freq: Every day | ORAL | Status: DC
  Filled 2015-09-11: qty 2

## 2015-09-11 MED ORDER — TRAZODONE HCL 150 MG PO TABS: 150.0000 mg | ORAL_TABLET | Freq: Every evening | ORAL | Status: DC | PRN

## 2015-09-11 MED ORDER — ACETAMINOPHEN 325 MG PO TABS: 650.0000 mg | ORAL_TABLET | ORAL | Status: DC | PRN

## 2015-09-11 MED ORDER — LORAZEPAM 1 MG PO TABS: 1.0000 mg | ORAL_TABLET | Freq: Three times a day (TID) | ORAL | Status: DC | PRN

## 2015-09-11 MED ORDER — BUDESONIDE-FORMOTEROL FUMARATE 160-4.5 MCG/ACT IN AERO
2.0000 | INHALATION_SPRAY | Freq: Two times a day (BID) | RESPIRATORY_TRACT | Status: DC
  Filled 2015-09-11: qty 6

## 2015-09-11 MED ORDER — TRAZODONE HCL 50 MG PO TABS: 150.0000 mg | ORAL_TABLET | Freq: Every day | ORAL | Status: DC

## 2015-09-11 NOTE — ED Notes (Signed)
Patient got up and walked to restroom

## 2015-09-11 NOTE — Progress Notes (Signed)
This Probation officer spoke with Yvette H. and she will complete this assessment.     Chesley Noon, MSW, [facility] [employer] Triage Specialist [phone]

## 2015-09-11 NOTE — Clinical Social Work Note (Signed)
CSW spoke with [facility] Act Team to coordinate pt discharge.  Per Asencion Partridge she will be paging someone to come to hospital to pick up pt.  Asencion Partridge will call back CSW with time frame of pick up. CSW will provide pt with information on ALF's with act team involvement  .Dede Query, LCSW [facility] Clinical Social Worker - Weekend Coverage cell #: [phone]
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
## 2015-09-11 NOTE — [facility] Suicide Risk Assessment (Addendum)
Suicide Risk Assessment  Discharge Assessment   [facility] Discharge Suicide Risk Assessment   Demographic Factors:  None  Total Time spent with patient: 45 minutes  Musculoskeletal: Strength & Muscle Tone: within normal limits Gait & Station: normal Patient leans: N/A  Psychiatric Specialty Exam: Review of Systems  Constitutional: Negative.   HENT: Negative.   Eyes: Negative.   Respiratory: Negative.   Cardiovascular: Negative.   Gastrointestinal: Negative.   Genitourinary: Negative.   Musculoskeletal: Negative.   Skin: Negative.   Neurological: Negative.   Endo/Heme/Allergies: Negative.   Psychiatric/Behavioral: Positive for depression.    Blood pressure 130/85, pulse 78, temperature 98.4 F (36.9 C), temperature source Oral, resp. rate 20, SpO2 100 %.There is no weight on file to calculate BMI.  General Appearance: Disheveled  Eye Contact::  Good  Speech:  Normal Rate  Volume:  Normal  Mood:  Depressed, mild situational  Affect:  Congruent  Thought Process:  Coherent  Orientation:  Full (Time, Place, and Person)  Thought Content:  WDL  Suicidal Thoughts:  No  Homicidal Thoughts:  No  Memory:  Immediate;   Good Recent;   Good Remote;   Good  Judgement:  Fair  Insight:  Good  Psychomotor Activity:  Normal  Concentration:  Good  Recall:  Good  Fund of Knowledge:Good  Language: Good  Akathisia:  No  Handed:  Right  AIMS (if indicated):     Assets:  Leisure Time Physical Health Resilience Social Support  ADL's:  Intact  Cognition: WNL  Sleep:      Has this patient used any form of tobacco in the last 30 days? (Cigarettes, Smokeless Tobacco, Cigars, and/or Pipes) No  Mental Status Per Nursing Assessment::   On Admission:   Depression  Current Mental Status by Physician: NA  Loss Factors: NA  Historical Factors: NA  Risk Reduction Factors:   Sense of responsibility to family, Living with another person, especially a relative, Positive social support  and Positive therapeutic relationship  Continued Clinical Symptoms:  Depression, mild, situational  Cognitive Features That Contribute To Risk:  None    Suicide Risk:  Minimal: No identifiable suicidal ideation.  Patients presenting with no risk factors but with morbid ruminations; may be classified as minimal risk based on the severity of the depressive symptoms  Principal Problem: Schizoaffective disorder, bipolar type [facility]) Discharge Diagnoses:  Patient Active Problem List   Diagnosis Date Noted  . Schizoaffective disorder, bipolar type ([place]) [F25.0] 01/11/2015    Priority: High  . Noncompliance with treatment [Z91.19] 07/16/2015      Plan Of Care/Follow-up recommendations:  Activity:  as tolerated Diet:  heart healthy diet  Is patient on multiple antipsychotic therapies at discharge:  No   Has Patient had three or more failed trials of antipsychotic monotherapy by history:  No  Recommended Plan for Multiple Antipsychotic Therapies: NA    LORD, JAMISON, PMH-NP 09/11/2015, 1:30 PM
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
## 2015-09-11 NOTE — ED Notes (Signed)
Pt. Remains in no distress and Luann, RN in [place] will receive him in their rm. 38.
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
## 2015-09-11 NOTE — Clinical Social Work Note (Signed)
CSW met with ACT team from [place] and pt at bedside to discuss discharge.  Act team discussed Taking pt to a shelter and if no room at shelter pt agreed to go back to her home.  Act team will also coordinate Getting pt's medications from her home if she chooses to stay at a shelter.  RN will prepare discharge paperwork  .Dede Query, LCSW [facility] Clinical Social Worker - Weekend Coverage cell #: [phone]

## 2015-11-28 IMAGING — MG MAMMO
2 series · 5 of 5 positions shown · non-contrast
Comparison: None.

CLINICAL DATA: 42-year-old female, screening turned into diagnostic
mammogram for left breast calcifications.

EXAM:
DIGITAL DIAGNOSTIC  BILATERAL MAMMOGRAM WITH CAD

[R MLO · right · 2 of 2 slices shown]
[im 1/2]
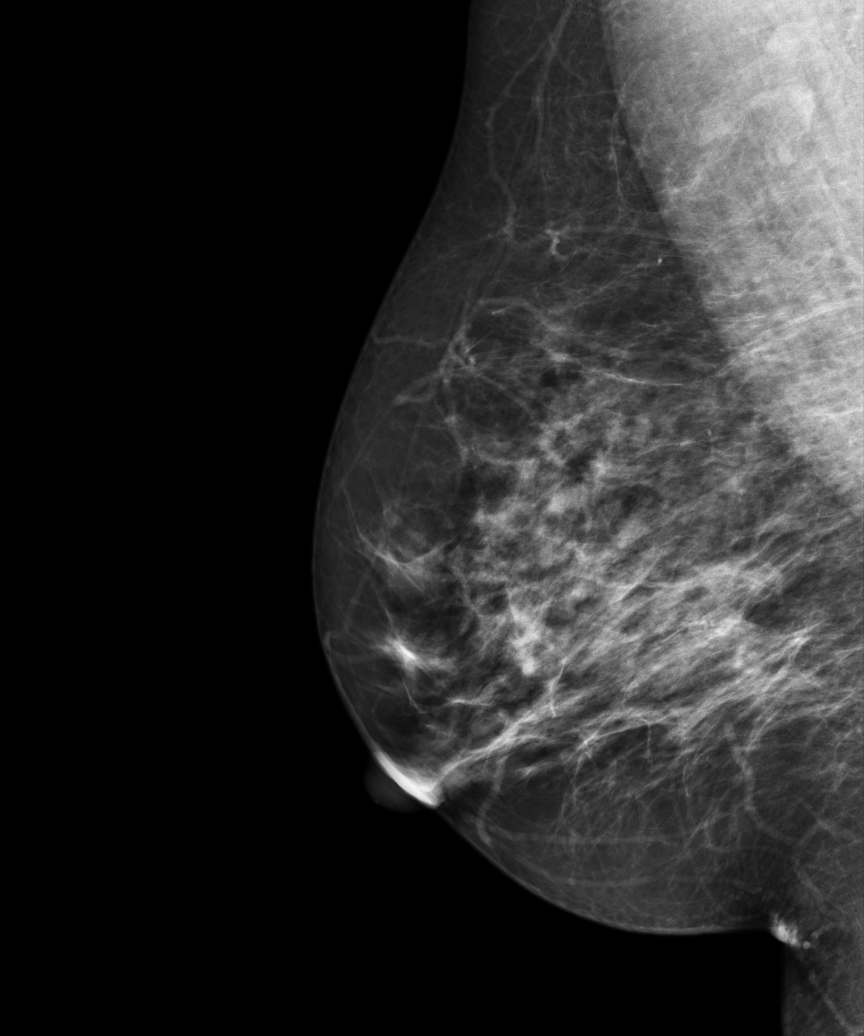
[im 2/2]
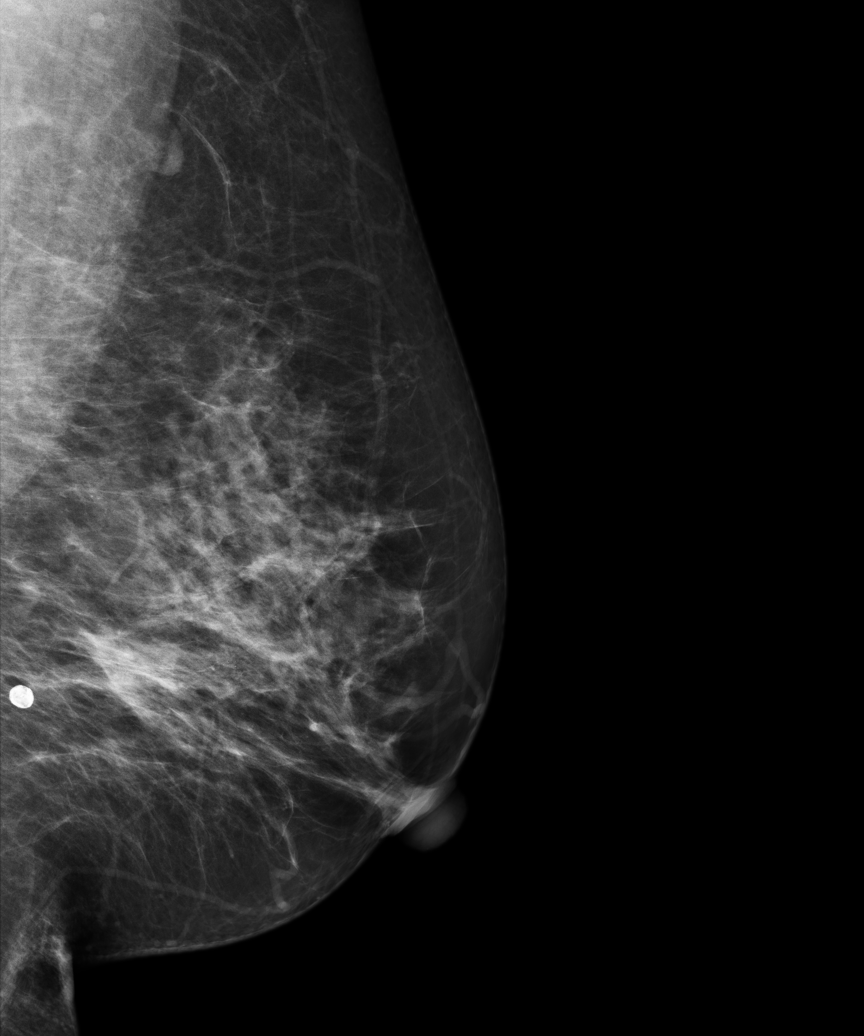

[L CC · left · 3 of 3 slices shown]
[im 1/3]
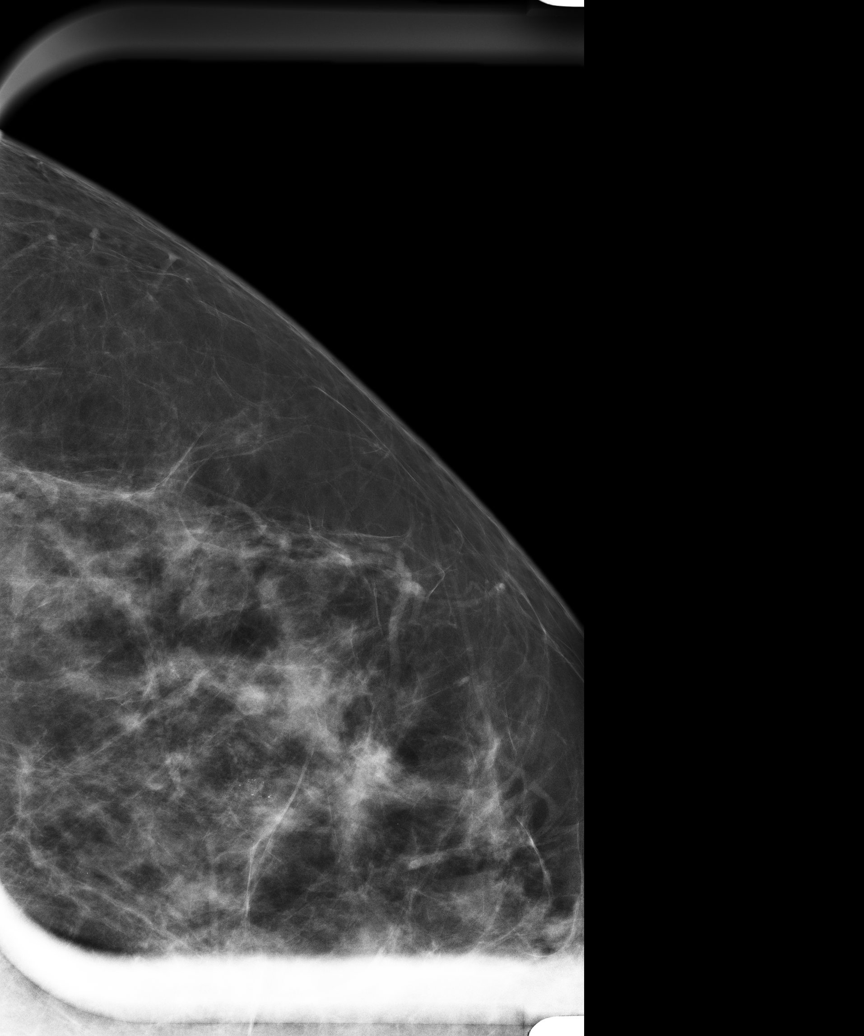
[im 2/3]
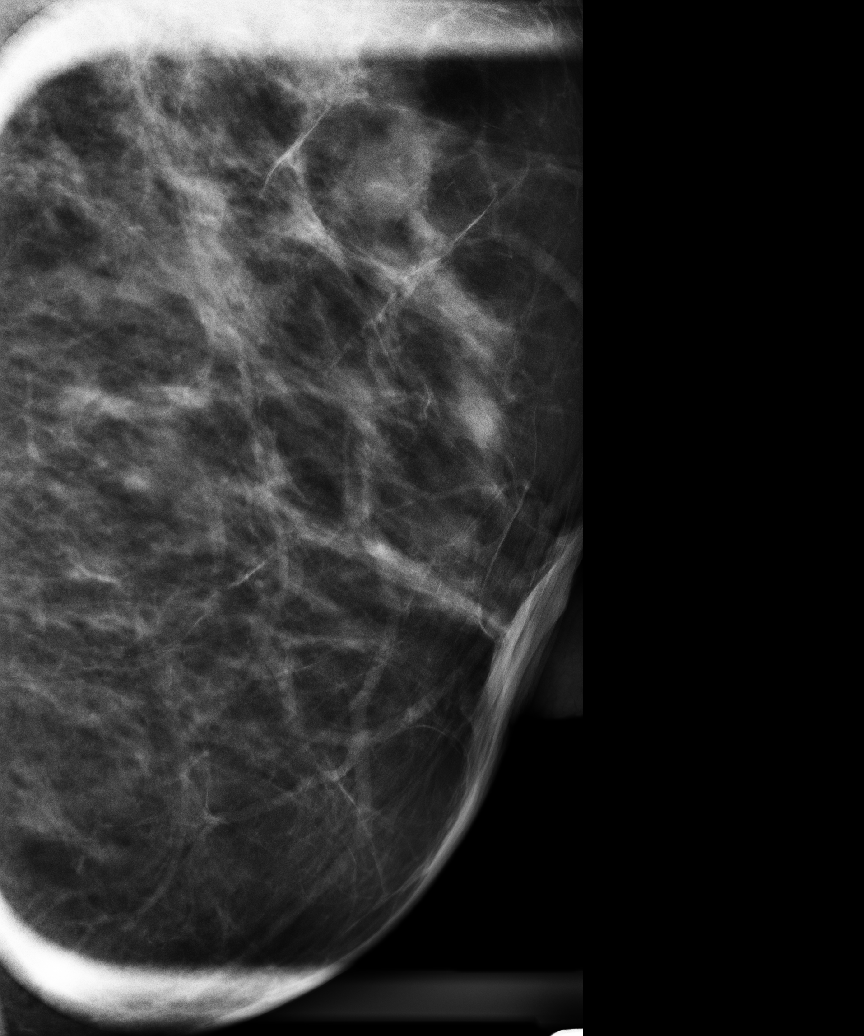
[im 3/3]
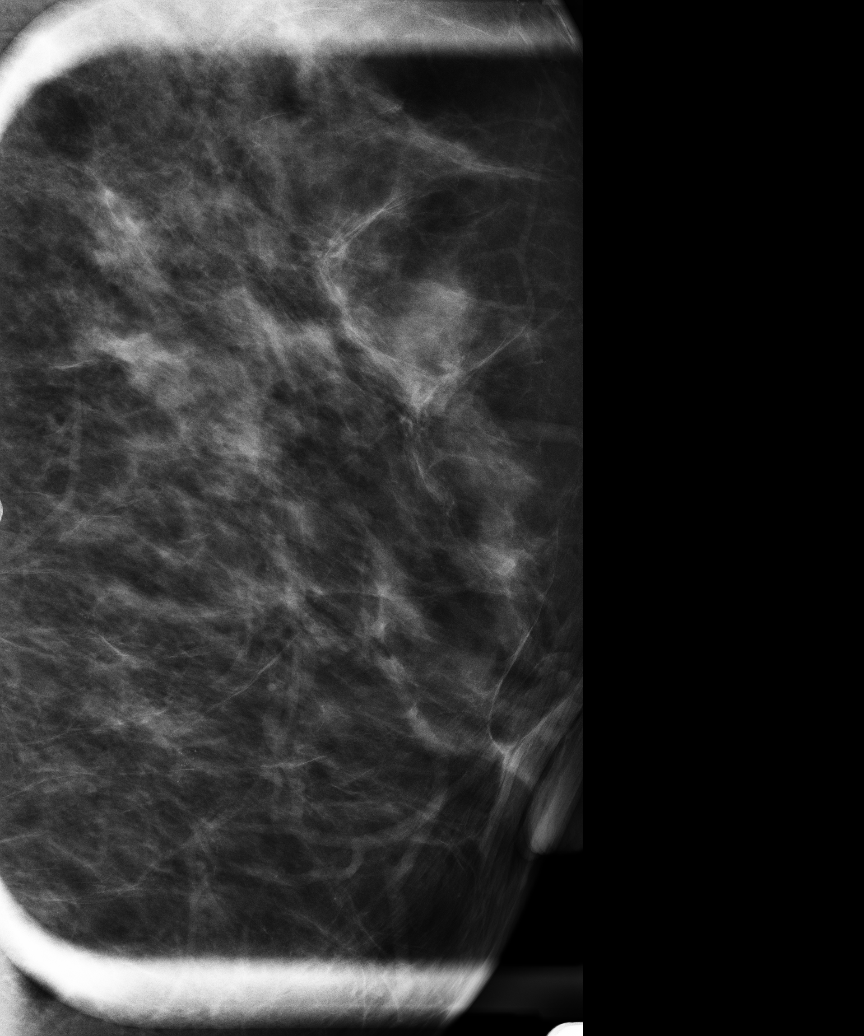

[5 of 5 positions shown; findings below may reference images not displayed]

ACR Breast Density Category c: The breasts are heterogeneously
dense, which may obscure small masses.
FINDINGS: There is a group of predominately punctate and round calcifications
in the outer left breast confirmed on the additional spot
compression CC and mL views.

Mammographic images were processed with CAD.
IMPRESSION: Indeterminate left breast calcifications.

RECOMMENDATION:
Stereotactic guided biopsy of the indeterminate calcifications in
the outer left breast is recommended. The patient's physician will
be contacted and this will be scheduled.

I have discussed the findings and recommendations with the patient.
Results were also provided in writing at the conclusion of the
visit. If applicable, a reminder letter will be sent to the patient
regarding the next appointment.

BI-RADS CATEGORY  4: Suspicious abnormality - biopsy should be
considered.

## 2016-06-28 ENCOUNTER — Emergency Department (HOSPITAL_COMMUNITY): Payer: Medicare HMO

## 2016-06-28 ENCOUNTER — Emergency Department (HOSPITAL_COMMUNITY)
Admission: EM | Admit: 2016-06-28 | Discharge: 2016-06-28 | Disposition: A | Discharge status: Home / Self Care | Payer: Medicare HMO | Attending: Emergency Medicine | Admitting: Emergency Medicine

## 2016-06-28 ENCOUNTER — Encounter (HOSPITAL_COMMUNITY): Payer: Self-pay | Admitting: *Deleted

## 2016-06-28 DIAGNOSIS — I10 Essential (primary) hypertension: Secondary | ICD-10-CM | POA: Insufficient documentation

## 2016-06-28 DIAGNOSIS — Z79899 Other long term (current) drug therapy: Secondary | ICD-10-CM | POA: Diagnosis not present

## 2016-06-28 DIAGNOSIS — Z87891 Personal history of nicotine dependence: Secondary | ICD-10-CM | POA: Insufficient documentation

## 2016-06-28 DIAGNOSIS — R519 Headache, unspecified: Secondary | ICD-10-CM

## 2016-06-28 DIAGNOSIS — R51 Headache: Secondary | ICD-10-CM | POA: Insufficient documentation

## 2016-06-28 DIAGNOSIS — M25572 Pain in left ankle and joints of left foot: Secondary | ICD-10-CM | POA: Diagnosis not present

## 2016-06-28 DIAGNOSIS — E119 Type 2 diabetes mellitus without complications: Secondary | ICD-10-CM | POA: Insufficient documentation

## 2016-06-28 DIAGNOSIS — J45909 Unspecified asthma, uncomplicated: Secondary | ICD-10-CM | POA: Insufficient documentation

## 2016-06-28 LAB — CBC WITH DIFFERENTIAL/PLATELET
BASOS PCT: 0 %
Basophils Absolute: 0 10*3/uL (ref 0.0–0.1)
EOS ABS: 0.3 10*3/uL (ref 0.0–0.7)
Eosinophils Relative: 5 %
HCT: 36.9 % (ref 36.0–46.0)
HEMOGLOBIN: 12.6 g/dL (ref 12.0–15.0)
LYMPHS ABS: 1.4 10*3/uL (ref 0.7–4.0)
Lymphocytes Relative: 25 %
MCH: 30.3 pg (ref 26.0–34.0)
MCHC: 34.1 g/dL (ref 30.0–36.0)
MCV: 88.7 fL (ref 78.0–100.0)
Monocytes Absolute: 0.3 10*3/uL (ref 0.1–1.0)
Monocytes Relative: 5 %
NEUTROS ABS: 3.6 10*3/uL (ref 1.7–7.7)
NEUTROS PCT: 65 %
Platelets: 194 10*3/uL (ref 150–400)
RBC: 4.16 MIL/uL (ref 3.87–5.11)
RDW: 13 % (ref 11.5–15.5)
WBC: 5.5 10*3/uL (ref 4.0–10.5)

## 2016-06-28 LAB — BASIC METABOLIC PANEL
Anion gap: 8 (ref 5–15)
BUN: 13 mg/dL (ref 6–20)
CHLORIDE: 107 mmol/L (ref 101–111)
CO2: 25 mmol/L (ref 22–32)
Calcium: 9.6 mg/dL (ref 8.9–10.3)
Creatinine, Ser: 0.81 mg/dL (ref 0.44–1.00)
GFR calc non Af Amer: >60 mL/min (ref >60)
Glucose, Bld: 100 mg/dL — ABNORMAL HIGH (ref 65–99)
POTASSIUM: 4 mmol/L (ref 3.5–5.1)
SODIUM: 140 mmol/L (ref 135–145)

## 2016-06-28 LAB — I-STAT BETA HCG BLOOD, ED (MC, WL, AP ONLY): I-stat hCG, quantitative: <5 m[IU]/mL (ref <5)

## 2016-06-28 MED ORDER — KETOROLAC TROMETHAMINE 30 MG/ML IJ SOLN
15.0000 mg | Freq: Once | INTRAMUSCULAR | Status: AC
  Administered 2016-06-28: 15 mg via INTRAVENOUS
  Filled 2016-06-28: qty 1

## 2016-06-28 MED ORDER — NAPROXEN 500 MG PO TABS: 500.0000 mg | ORAL_TABLET | Freq: Two times a day (BID) | ORAL | 0 refills | Status: DC

## 2016-06-28 MED ORDER — ONDANSETRON 4 MG PO TBDP
4.0000 mg | ORAL_TABLET | Freq: Once | ORAL | Status: AC | PRN
  Administered 2016-06-28: 4 mg via ORAL
  Filled 2016-06-28: qty 1

## 2016-06-28 MED ORDER — SODIUM CHLORIDE 0.9 % IV BOLUS (SEPSIS)
1000.0000 mL | Freq: Once | INTRAVENOUS | Status: AC
  Administered 2016-06-28: 1000 mL via INTRAVENOUS

## 2016-06-28 MED ORDER — DIPHENHYDRAMINE HCL 50 MG/ML IJ SOLN
25.0000 mg | Freq: Once | INTRAMUSCULAR | Status: AC
  Administered 2016-06-28: 25 mg via INTRAVENOUS
  Filled 2016-06-28: qty 1

## 2016-06-28 NOTE — ED Notes (Addendum)
Patient in Radiology/ RN going to start IV when returns

## 2016-06-28 NOTE — ED Notes (Signed)
Patient given Ginger ale and graham crackers for PO challenge.

## 2016-06-28 NOTE — Discharge Instructions (Signed)
Take your medication as prescribed as needed for your headache/ankle pain. You may also take Tylenol as prescribed over the counter as needed for pain relief. Continue drinking fluids to remain hydrated at home. I also recommend applying ice to her ankle for 15 minutes 3-4 times daily to help with pain and swelling. Follow-up with your primary care provider within the next week if your symptoms have not improved. Please return to the Emergency Department if symptoms worsen or new onset of fever, neck stiffness, visual changes, photophobia, abdominal pain, vomiting, urinary symptoms, numbness, tingling, weakness, seizures, syncope.

## 2016-06-28 NOTE — ED Triage Notes (Signed)
Per EMS - patient comes from home where she lives with family.  Patient c/o diffuse headache x4 days and left ankle pain since yesterday.  Patient denies falling or recent trauma to left ankle, she states it "just started hurting."  Patient states she has had a headache similar to current one, "a long time ago."  Patient endorses nausea earlier today which has since resolved.  Patient denies V/D and fever.  [name] vitals, 126/88, HR 84, 97% on RA, CBG 101.

## 2016-06-28 NOTE — ED Provider Notes (Signed)
[facility] DEPT Provider Note   CSN: [id number] Arrival date & time: 06/28/16  1002     History   Chief Complaint Chief Complaint  Patient presents with  . Ankle Pain  . Headache    HPI [name] is a 45 y.o. female.  Patient is a 45 year old female with history of hypertension, schizophrenia, seizures, anxiety, bipolar disorder and diabetes who presents to the ED with complaint of headache, onset 2 days. Patient reports waking up 2 days ago with mild headache that she states was located to her for head, temporal regions and posterior scalp. She reports the headache was waxing and waning in nature but notes it has gradually worsened over the past 2 days. Denies any aggravating or alleviating factors. Patient states when she woke up this morning the headache had worsened resulting her coming to the ED. Endorses associated nausea, reports one episode of NBNB vomiting this morning and reports having mild lightheadedness. Denies history of migraines. Reports taking generic aspirin at home with intermittent relief. Pt denies fever, neck stiffness, visual changes, photophobia, abdominal pain, N/V, urinary symptoms, numbness, tingling, weakness, seizures, syncope.    Patient also reports having pain and swelling to her left ankle and foot since yesterday. Patient reports waking up yesterday morning and noticing pain to her left lateral ankle and top of her left foot. Patient reports pain is worse with palpation or when ambulating. Denies any recent fall, trauma or injury. Denies redness, bruising, numbness, tingling, weakness.      Past Medical History:  Diagnosis Date  . Anemia   . Anxiety   . Asthma   . Bipolar 1 disorder ([place])   . Depression   . Diabetes mellitus without complication ([place])   . Hypertension   . Insomnia, persistent   . Schizophrenic disorder ([place])   . Seizures [facility])     Patient Active Problem List   Diagnosis Date Noted  . Noncompliance with treatment  07/16/2015  . Schizoaffective disorder, bipolar type ([place]) 01/11/2015    Past Surgical History:  Procedure Laterality Date  . TONSILLECTOMY      OB History    No data available       Home Medications    Prior to Admission medications   Medication Sig Start Date End Date Taking? Authorizing Provider  ABILIFY MAINTENA 400 MG SUSR Inject 400 mg into the muscle every 30 (thirty) days.  07/16/15   Historical Provider, MD  ARIPiprazole (ABILIFY) 20 MG tablet Take 1 tablet (20 mg total) by mouth at bedtime. 09/11/15 10/11/15  Patrecia Pour, NP  benztropine (COGENTIN) 0.5 MG tablet Take 1 tablet (0.5 mg total) by mouth 2 (two) times daily in the am and at bedtime.. To prevent side effects from Abilify 09/11/15   Patrecia Pour, NP  budesonide-formoterol [name]) 160-4.5 MCG/ACT inhaler Inhale 2 puffs into the lungs 2 (two) times daily. For Asthma 01/20/15   Shuvon B Rankin, NP  divalproex (DEPAKOTE ER) 500 MG 24 hr tablet Take 1 tablet (500 mg total) by mouth 2 (two) times daily. 09/11/15 10/11/15  Patrecia Pour, NP  hydrOXYzine (ATARAX/VISTARIL) 25 MG tablet Take 1 tablet (25 mg total) by mouth 3 (three) times daily as needed for itching or anxiety (sleep). 09/11/15   Patrecia Pour, NP  naproxen (NAPROSYN) 500 MG tablet Take 1 tablet (500 mg total) by mouth 2 (two) times daily. 06/28/16   Nona Dell, PA-C  traZODone (DESYREL) 150 MG tablet Take 1 tablet (150 mg total)  by mouth at bedtime as needed for sleep. 09/11/15   Patrecia Pour, NP    Family History Family History  Problem Relation Age of Onset  . Depression Mother   . Gout Mother   . Alcoholism Other     Social History Social History  Substance Use Topics  . Smoking status: Former Research scientist (life sciences)  . Smokeless tobacco: Never Used  . Alcohol use Yes     Comment: rare     Allergies   Haldol [haloperidol decanoate]; Penicillins; and Shrimp [shellfish allergy]   Review of Systems Review of Systems  Gastrointestinal:  Positive for nausea and vomiting.  Musculoskeletal: Positive for arthralgias (left foot/ankle) and joint swelling.  Neurological: Positive for light-headedness and headaches.  All other systems reviewed and are negative.    Physical Exam Updated Vital Signs BP 104/65   Pulse 73   Temp 97.8 F (36.6 C)   Resp 16   LMP 06/14/2016   SpO2 100%   Physical Exam  Constitutional: She is oriented to person, place, and time. She appears well-developed and well-nourished. No distress.  HENT:  Head: Normocephalic and atraumatic.  Right Ear: Tympanic membrane normal.  Left Ear: Tympanic membrane normal.  Nose: Nose normal. Right sinus exhibits no maxillary sinus tenderness and no frontal sinus tenderness. Left sinus exhibits no maxillary sinus tenderness and no frontal sinus tenderness.  Mouth/Throat: Uvula is midline, oropharynx is clear and moist and mucous membranes are normal. No oropharyngeal exudate, posterior oropharyngeal edema, posterior oropharyngeal erythema or tonsillar abscesses. No tonsillar exudate.  Eyes: Conjunctivae and EOM are normal. Pupils are equal, round, and reactive to light. Right eye exhibits no discharge. Left eye exhibits no discharge. No scleral icterus.  No nystagmus  Neck: Normal range of motion. Neck supple.  No cervical midline tenderness  Cardiovascular: Normal rate, regular rhythm, normal heart sounds and intact distal pulses.   Pulmonary/Chest: Effort normal and breath sounds normal. No respiratory distress. She has no wheezes. She has no rales. She exhibits no tenderness.  Abdominal: Soft. Bowel sounds are normal. She exhibits no distension and no mass. There is no tenderness. There is no rebound and no guarding. No hernia.  Musculoskeletal: Normal range of motion. She exhibits tenderness. She exhibits no edema or deformity.       Left ankle: She exhibits swelling. She exhibits normal range of motion, no ecchymosis, no deformity, no laceration and normal  pulse. Tenderness. Lateral malleolus tenderness found. Achilles tendon normal.       Left foot: There is tenderness. There is normal range of motion, no bony tenderness, no swelling, normal capillary refill, no crepitus, no deformity and no laceration.       Feet:  Lymphadenopathy:    She has no cervical adenopathy.  Neurological: She is alert and oriented to person, place, and time. She has normal strength. No cranial nerve deficit or sensory deficit. She displays a negative Romberg sign. Coordination normal.  Normal finger nose finger.  Skin: Skin is warm and dry. She is not diaphoretic.  Nursing note and vitals reviewed.    ED Treatments / Results  Labs (all labs ordered are listed, but only abnormal results are displayed) Labs Reviewed  BASIC METABOLIC PANEL - Abnormal; Notable for the following:       Result Value   Glucose, Bld 100 (*)    All other components within normal limits  CBC WITH DIFFERENTIAL/PLATELET  I-STAT BETA HCG BLOOD, ED ([facility], [facility], [facility] ONLY)    EKG  EKG Interpretation  None       Radiology Dg Ankle Complete Left  Result Date: 06/28/2016 CLINICAL DATA:  Pain for 1 day.  No history of trauma EXAM: LEFT ANKLE COMPLETE - 3+ VIEW COMPARISON:  None. FINDINGS: Frontal, oblique, and lateral views were obtained. There is no fracture or joint effusion. The ankle mortise appears intact. There is no appreciable joint space narrowing. There is a spur arising from the inferior calcaneus. There is calcification in the distal Achilles tendon. IMPRESSION: No demonstrable fracture or appreciable joint space narrowing. Ankle mortise appears intact. Distal Achilles calcific tendinosis. Small inferior calcaneal spur noted. Electronically Signed   By: Lowella Grip III M.D.   On: 06/28/2016 11:45   Dg Foot Complete Left  Result Date: 06/28/2016 CLINICAL DATA:  Pain for 1 day.  No history of trauma EXAM: LEFT FOOT - COMPLETE 3+ VIEW COMPARISON:  None. FINDINGS: Frontal,  oblique, and lateral views were obtained. There is no evident fracture or dislocation. The joint spaces appear normal. No erosive change. There is a small inferior calcaneal spur. There is mild calcification in the distal Achilles tendon. IMPRESSION: Mild distal Achilles calcific tendinosis. Small inferior calcaneal spur. No appreciable joint space narrowing or erosion. No fracture or dislocation. Electronically Signed   By: Lowella Grip III M.D.   On: 06/28/2016 11:44    Procedures Procedures (including critical care time)  Medications Ordered in ED Medications  ondansetron (ZOFRAN-ODT) disintegrating tablet 4 mg (4 mg Oral Given 06/28/16 1021)  sodium chloride 0.9 % bolus 1,000 mL (0 mLs Intravenous Stopped 06/28/16 1235)  ketorolac (TORADOL) 30 MG/ML injection 15 mg (15 mg Intravenous Given 06/28/16 1139)  diphenhydrAMINE (BENADRYL) injection 25 mg (25 mg Intravenous Given 06/28/16 1140)     Initial Impression / Assessment and Plan / ED Course  I have reviewed the triage vital signs and the nursing notes.  Pertinent labs & imaging results that were available during my care of the patient were reviewed by me and considered in my medical decision making (see chart for details).  Clinical Course    Patient presents with headache with associated nausea and one episode of vomiting. Denies fever, neck stiffness. Patient given Zofran in triage. VSS. Exam unremarkable. No neuro deficits. Patient reports resolution of nausea. Patient given IV fluids, Toradol and Benadryl for her headache. Labs unremarkable. On reevaluation patient reports her headache has improved. Pt able to tolerate PO. Pt able to stand and ambulate. I do not suspect SAH, ICH, intracranial lesion, meningitis, encephalopathy or encephalitis and do not think that cranial imaging is needed at this time. Plan to discharge patient home with symptomatic treatment and follow-up. Discussed return precautions.  Patient also presents  with left ankle pain and swelling there and present since yesterday. Denies any recent fall, trauma or injury. Exam revealed tenderness to left lateral malleolus and dorsum of left foot with mild swelling. Left lower extremity neurovascularly intact. X-ray of left foot and ankle unremarkable for acute fracture. Plan to discharge patient home with symptomatic treatment including NSAIDs and RICE protocol. Follow up given. Discussed return precautions.  Final Clinical Impressions(s) / ED Diagnoses   Final diagnoses:  Acute left ankle pain  Acute nonintractable headache, unspecified headache type    New Prescriptions New Prescriptions   NAPROXEN (NAPROSYN) 500 MG TABLET    Take 1 tablet (500 mg total) by mouth 2 (two) times daily.     Chesley Noon Gilliam, [place] 06/28/16 [place], MD 06/28/16 [phone]

## 2016-09-01 ENCOUNTER — Emergency Department (HOSPITAL_COMMUNITY)
Admission: EM | Admit: 2016-09-01 | Discharge: 2016-09-03 | Disposition: A | Discharge status: Home / Self Care | Payer: Medicare HMO | Attending: Emergency Medicine | Admitting: Emergency Medicine

## 2016-09-01 ENCOUNTER — Encounter (HOSPITAL_COMMUNITY): Payer: Self-pay | Admitting: *Deleted

## 2016-09-01 DIAGNOSIS — E119 Type 2 diabetes mellitus without complications: Secondary | ICD-10-CM | POA: Diagnosis not present

## 2016-09-01 DIAGNOSIS — Z79899 Other long term (current) drug therapy: Secondary | ICD-10-CM | POA: Diagnosis not present

## 2016-09-01 DIAGNOSIS — F250 Schizoaffective disorder, bipolar type: Secondary | ICD-10-CM | POA: Diagnosis present

## 2016-09-01 DIAGNOSIS — F2081 Schizophreniform disorder: Secondary | ICD-10-CM | POA: Diagnosis not present

## 2016-09-01 DIAGNOSIS — Z87891 Personal history of nicotine dependence: Secondary | ICD-10-CM | POA: Insufficient documentation

## 2016-09-01 DIAGNOSIS — Z046 Encounter for general psychiatric examination, requested by authority: Secondary | ICD-10-CM | POA: Diagnosis present

## 2016-09-01 DIAGNOSIS — J45909 Unspecified asthma, uncomplicated: Secondary | ICD-10-CM | POA: Insufficient documentation

## 2016-09-01 DIAGNOSIS — I10 Essential (primary) hypertension: Secondary | ICD-10-CM | POA: Diagnosis not present

## 2016-09-01 DIAGNOSIS — F209 Schizophrenia, unspecified: Secondary | ICD-10-CM

## 2016-09-01 LAB — CBC WITH DIFFERENTIAL/PLATELET
BASOS ABS: 0 10*3/uL (ref 0.0–0.1)
BASOS PCT: 0 %
Eosinophils Absolute: 0.2 10*3/uL (ref 0.0–0.7)
Eosinophils Relative: 4 %
HEMATOCRIT: 38 % (ref 36.0–46.0)
HEMOGLOBIN: 13.1 g/dL (ref 12.0–15.0)
LYMPHS PCT: 34 %
Lymphs Abs: 2.2 10*3/uL (ref 0.7–4.0)
MCH: 29.7 pg (ref 26.0–34.0)
MCHC: 34.5 g/dL (ref 30.0–36.0)
MCV: 86.2 fL (ref 78.0–100.0)
Monocytes Absolute: 0.4 10*3/uL (ref 0.1–1.0)
Monocytes Relative: 7 %
NEUTROS ABS: 3.5 10*3/uL (ref 1.7–7.7)
NEUTROS PCT: 55 %
Platelets: 232 10*3/uL (ref 150–400)
RBC: 4.41 MIL/uL (ref 3.87–5.11)
RDW: 12.9 % (ref 11.5–15.5)
WBC: 6.3 10*3/uL (ref 4.0–10.5)

## 2016-09-01 LAB — BASIC METABOLIC PANEL
ANION GAP: 9 (ref 5–15)
BUN: 13 mg/dL (ref 6–20)
CHLORIDE: 108 mmol/L (ref 101–111)
CO2: 24 mmol/L (ref 22–32)
Calcium: 9.7 mg/dL (ref 8.9–10.3)
Creatinine, Ser: 0.93 mg/dL (ref 0.44–1.00)
GFR calc non Af Amer: >60 mL/min (ref >60)
GLUCOSE: 113 mg/dL — AB (ref 65–99)
Potassium: 3.6 mmol/L (ref 3.5–5.1)
Sodium: 141 mmol/L (ref 135–145)

## 2016-09-01 LAB — ETHANOL: Alcohol, Ethyl (B): <5 mg/dL (ref <5)

## 2016-09-01 MED ORDER — ZIPRASIDONE MESYLATE 20 MG IM SOLR
20.0000 mg | Freq: Once | INTRAMUSCULAR | Status: AC
  Administered 2016-09-01: 20 mg via INTRAMUSCULAR
  Filled 2016-09-01: qty 20

## 2016-09-01 MED ORDER — STERILE WATER FOR INJECTION IJ SOLN
INTRAMUSCULAR | Status: AC
  Administered 2016-09-01: 1.2 mL
  Filled 2016-09-01: qty 10

## 2016-09-01 NOTE — ED Provider Notes (Signed)
[facility] DEPT Provider Note   CSN: [id number] Arrival date & time: 09/01/16  2241     History   Chief Complaint Chief Complaint  Patient presents with  . Medical Clearance    HPI [name] is a 46 y.o. female.  Level V caveat for psychiatric illness. Patient was brought to the ED by the [facility] after she was escorted off the premises of the [employer]. She has not been taking her psychiatric medications. She has a history of schizophrenic disorder and bipolar. No obvious suicidal or homicidal ideation.      Past Medical History:  Diagnosis Date  . Anemia   . Anxiety   . Asthma   . Bipolar 1 disorder ([place])   . Depression   . Diabetes mellitus without complication ([place])   . Hypertension   . Insomnia, persistent   . Schizophrenic disorder ([place])   . Seizures [facility])     Patient Active Problem List   Diagnosis Date Noted  . Noncompliance with treatment 07/16/2015  . Schizoaffective disorder, bipolar type ([place]) 01/11/2015    Past Surgical History:  Procedure Laterality Date  . TONSILLECTOMY      OB History    No data available       Home Medications    Prior to Admission medications   Medication Sig Start Date End Date Taking? Authorizing Provider  ABILIFY MAINTENA 400 MG SUSR Inject 400 mg into the muscle every 30 (thirty) days.  07/16/15  Yes Historical Provider, MD  ARIPiprazole (ABILIFY) 20 MG tablet Take 1 tablet (20 mg total) by mouth at bedtime. 09/11/15 09/01/16 Yes Patrecia Pour, NP  benztropine (COGENTIN) 0.5 MG tablet Take 1 tablet (0.5 mg total) by mouth 2 (two) times daily in the am and at bedtime.. To prevent side effects from Abilify 09/11/15  Yes Patrecia Pour, NP  benztropine (COGENTIN) 1 MG tablet Take 1 mg by mouth daily.  08/28/16  Yes Historical Provider, MD  budesonide-formoterol (SYMBICORT) 160-4.5 MCG/ACT inhaler Inhale 2 puffs into the lungs 2 (two) times daily. For Asthma 01/20/15  Yes Shuvon B Rankin, NP    divalproex (DEPAKOTE ER) 500 MG 24 hr tablet Take 500 mg by mouth daily.  08/28/16  Yes Historical Provider, MD  fluPHENAZine (PROLIXIN) 10 MG tablet Take 10 mg by mouth daily.  08/01/16  Yes Historical Provider, MD  Inulin (FIBER CHOICE PO) Take 1 tablet by mouth daily.   Yes Historical Provider, MD  OVER THE COUNTER MEDICATION Take 1 tablet by mouth daily. Weight control medication   Yes Historical Provider, MD  divalproex (DEPAKOTE ER) 500 MG 24 hr tablet Take 1 tablet (500 mg total) by mouth 2 (two) times daily. 09/11/15 10/11/15  Patrecia Pour, NP  hydrOXYzine (ATARAX/VISTARIL) 25 MG tablet Take 1 tablet (25 mg total) by mouth 3 (three) times daily as needed for itching or anxiety (sleep). Patient not taking: Reported on 09/01/2016 09/11/15   Patrecia Pour, NP  naproxen (NAPROSYN) 500 MG tablet Take 1 tablet (500 mg total) by mouth 2 (two) times daily. Patient not taking: Reported on 09/01/2016 06/28/16   Nona Dell, PA-C  traZODone (DESYREL) 150 MG tablet Take 1 tablet (150 mg total) by mouth at bedtime as needed for sleep. Patient not taking: Reported on 09/01/2016 09/11/15   Patrecia Pour, NP    Family History Family History  Problem Relation Age of Onset  . Depression Mother   . Gout Mother   . Alcoholism  Other     Social History Social History  Substance Use Topics  . Smoking status: Former Research scientist (life sciences)  . Smokeless tobacco: Never Used  . Alcohol use Yes     Comment: rare     Allergies   Haldol [haloperidol decanoate]; Penicillins; and Shrimp [shellfish allergy]   Review of Systems Review of Systems  Reason unable to perform ROS: Mental health.     Physical Exam Updated Vital Signs BP 146/100 (BP Location: Left Arm)   Pulse 118   Temp 98 F (36.7 C) (Oral)   Resp 20   SpO2 100%   Physical Exam  Constitutional: She is oriented to person, place, and time. She appears well-developed and well-nourished.  HENT:  Head: Normocephalic and atraumatic.  Eyes:  Conjunctivae are normal.  Neck: Neck supple.  Cardiovascular: Normal rate and regular rhythm.   Pulmonary/Chest: Effort normal and breath sounds normal.  Abdominal: Soft. Bowel sounds are normal.  Musculoskeletal: Normal range of motion.  Neurological: She is alert and oriented to person, place, and time.  Skin: Skin is warm and dry.  Psychiatric:  Flight of ideas, tangential thinking; psychotic  Nursing note and vitals reviewed.    ED Treatments / Results  Labs (all labs ordered are listed, but only abnormal results are displayed) Labs Reviewed  BASIC METABOLIC PANEL - Abnormal; Notable for the following:       Result Value   Glucose, Bld 113 (*)    All other components within normal limits  CBC WITH DIFFERENTIAL/PLATELET  ETHANOL  RAPID URINE DRUG SCREEN, HOSP PERFORMED    EKG  EKG Interpretation None       Radiology No results found.  Procedures Procedures (including critical care time)  Medications Ordered in ED Medications  ziprasidone (GEODON) injection 20 mg (20 mg Intramuscular Given 09/01/16 2328)  sterile water (preservative free) injection (1.2 mLs  Given 09/01/16 2328)     Initial Impression / Assessment and Plan / ED Course  I have reviewed the triage vital signs and the nursing notes.  Pertinent labs & imaging results that were available during my care of the patient were reviewed by me and considered in my medical decision making (see chart for details).  Clinical Course     Patient has known mental health problems including a schizophrenic disorder and bipolar. She has not been taking her meds. She appears to be psychotic tonight. Geodon IM administered. Will consult behavioral health.  Final Clinical Impressions(s) / ED Diagnoses   Final diagnoses:  Schizophrenic disorder [facility])    New Prescriptions New Prescriptions   No medications on file     Nat Christen, MD 09/01/16 2355

## 2016-09-01 NOTE — ED Triage Notes (Signed)
Pt brought in by [employer] d/t being @ [employer] and was to be escorted off premises.  Per [employer] pt has been off meds x 2 weeks.

## 2016-09-02 DIAGNOSIS — F2081 Schizophreniform disorder: Secondary | ICD-10-CM | POA: Diagnosis not present

## 2016-09-02 LAB — RAPID URINE DRUG SCREEN, HOSP PERFORMED
Amphetamines: NOT DETECTED
Barbiturates: NOT DETECTED
Benzodiazepines: NOT DETECTED
COCAINE: NOT DETECTED
OPIATES: NOT DETECTED
Tetrahydrocannabinol: NOT DETECTED

## 2016-09-02 MED ORDER — ARIPIPRAZOLE 10 MG PO TABS
20.0000 mg | ORAL_TABLET | Freq: Every day | ORAL | Status: DC
  Administered 2016-09-02: 20 mg via ORAL
  Filled 2016-09-02: qty 2

## 2016-09-02 MED ORDER — LORAZEPAM 1 MG PO TABS
1.0000 mg | ORAL_TABLET | Freq: Three times a day (TID) | ORAL | Status: DC | PRN
  Administered 2016-09-02: 1 mg via ORAL
  Filled 2016-09-02: qty 1

## 2016-09-02 MED ORDER — TRAZODONE HCL 50 MG PO TABS
150.0000 mg | ORAL_TABLET | Freq: Every evening | ORAL | Status: DC | PRN
  Administered 2016-09-02: 150 mg via ORAL
  Filled 2016-09-02: qty 1

## 2016-09-02 MED ORDER — ALUM & MAG HYDROXIDE-SIMETH 200-200-20 MG/5ML PO SUSP: 30.0000 mL | ORAL | Status: DC | PRN

## 2016-09-02 MED ORDER — DIVALPROEX SODIUM ER 500 MG PO TB24
500.0000 mg | ORAL_TABLET | Freq: Two times a day (BID) | ORAL | Status: DC
  Administered 2016-09-02 – 2016-09-03 (×3): 500 mg via ORAL
  Filled 2016-09-02 (×3): qty 1

## 2016-09-02 MED ORDER — HYDROXYZINE HCL 25 MG PO TABS
25.0000 mg | ORAL_TABLET | Freq: Three times a day (TID) | ORAL | Status: DC | PRN
  Filled 2016-09-02: qty 1

## 2016-09-02 MED ORDER — ACETAMINOPHEN 325 MG PO TABS: 650.0000 mg | ORAL_TABLET | ORAL | Status: DC | PRN

## 2016-09-02 MED ORDER — ARIPIPRAZOLE 10 MG PO TABS: 20.0000 mg | ORAL_TABLET | Freq: Every day | ORAL | Status: DC

## 2016-09-02 MED ORDER — ONDANSETRON HCL 4 MG PO TABS: 4.0000 mg | ORAL_TABLET | Freq: Three times a day (TID) | ORAL | Status: DC | PRN

## 2016-09-02 MED ORDER — BENZTROPINE MESYLATE 1 MG PO TABS
1.0000 mg | ORAL_TABLET | Freq: Every day | ORAL | Status: DC
  Administered 2016-09-02 – 2016-09-03 (×2): 1 mg via ORAL
  Filled 2016-09-02 (×2): qty 1

## 2016-09-02 MED ORDER — IBUPROFEN 200 MG PO TABS
600.0000 mg | ORAL_TABLET | Freq: Three times a day (TID) | ORAL | Status: DC | PRN
  Administered 2016-09-02 – 2016-09-03 (×2): 600 mg via ORAL
  Filled 2016-09-02 (×2): qty 3

## 2016-09-02 NOTE — ED Notes (Signed)
SBAR Report received from previous nurse. Pt received calm and visible on unit. Pt denies current SI/ HI, A/V H,or pain at this time, and appears otherwise stable and free of distress. Pt endorses depression and anxiety.  Pt reminded of camera surveillance, q 15 min rounds, and rules of the milieu. Will continue to assess.

## 2016-09-02 NOTE — ED Notes (Addendum)
Pt admitted to room #42. Pt reports "I wasn't feeling well, I've been sick;I'm allergic to something."  Pt disorganized,tangential.. Pt denies SI/HI/AVH. Reports she has been compliant with her medication regimen. Special checks q 15 mins in place for safety. Video monitoring in place. Will continue to monitor.

## 2016-09-02 NOTE — ED Notes (Signed)
Pt reports a headache but is unable to give a pain score.  Pt also seen wandering the room and needs to be re-oriented multiple times.  Pt given medications for headache and agitation.
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
## 2016-09-03 DIAGNOSIS — F2081 Schizophreniform disorder: Secondary | ICD-10-CM | POA: Diagnosis not present

## 2016-09-03 NOTE — ED Notes (Signed)
Report given 50 to [facility] RN, who called back later to say that Dr. Renard Hamper, accepting physician, required that pt be IVC'd for transfer. Social worker notified.

## 2016-09-03 NOTE — BHH Counselor (Signed)
Pt has been referred to the following inpt hospitals for possible admission: [facility], [place], [place], [place], [place], [place], [place], [place].  Lind Covert, MSW, Latanya Presser

## 2016-09-03 NOTE — ED Notes (Signed)
[name] verbalized agreement to go to [facility].

## 2016-09-03 NOTE — ED Notes (Signed)
Orders received to discharge patient. Pt aware and discharge summery reviewed with patient as well as review of medications. All personal items return and patient signed all discharge paperwork. Pt escorted off unit.

## 2016-09-03 NOTE — Progress Notes (Signed)
CSW faxed patient's IVC paperwork to [facility] office. CSW attempted to contact [place] office to confirm IVC received, phone not working. CSW filed IVC paperwork in IVC log book.

## 2016-09-03 NOTE — ED Notes (Signed)
[name] was pleasant and cooperative, approaching staff to expressing appreciation for breakfast and ask for towels. She denied SI, HI, and AVH. She complained of a HA and received ibuprofen 600 mg PRN with results pending. She mentioned people "didn't need to worry about voodoo" but was otherwise appropriate in her conversation. Pt was given supplies for hygiene and urged to approach staff with needs/concerns. Will continue to monitor for needs/safety.

## 2016-09-03 NOTE — BH Assessment (Addendum)
Michael from [facility] called to report the pt has been accepted after 10:00am on 09/03/16. Accepting is Doctor Toy Baker Goli to room 351. Call to report is [phone], call after 08:30 to give report. Bethena Roys, RN has been notified.    Lind Covert, MSW, Latanya Presser

## 2016-09-20 ENCOUNTER — Other Ambulatory Visit (HOSPITAL_COMMUNITY): Payer: Self-pay | Admitting: Internal Medicine

## 2016-09-20 DIAGNOSIS — Z1231 Encounter for screening mammogram for malignant neoplasm of breast: Secondary | ICD-10-CM

## 2016-09-28 ENCOUNTER — Encounter (HOSPITAL_COMMUNITY): Payer: Self-pay | Admitting: Radiology

## 2016-09-28 ENCOUNTER — Ambulatory Visit (HOSPITAL_COMMUNITY)
Admission: RE | Admit: 2016-09-28 | Discharge: 2016-09-28 | Disposition: A | Discharge status: Home / Self Care | Payer: Medicare HMO | Source: Ambulatory Visit | Attending: Internal Medicine | Admitting: Internal Medicine

## 2016-09-28 DIAGNOSIS — Z1231 Encounter for screening mammogram for malignant neoplasm of breast: Secondary | ICD-10-CM | POA: Diagnosis present

## 2016-10-11 ENCOUNTER — Encounter: Payer: Self-pay | Admitting: *Deleted

## 2016-10-24 ENCOUNTER — Encounter (INDEPENDENT_AMBULATORY_CARE_PROVIDER_SITE_OTHER): Payer: Self-pay

## 2016-10-24 ENCOUNTER — Other Ambulatory Visit (HOSPITAL_COMMUNITY)
Admission: RE | Admit: 2016-10-24 | Discharge: 2016-10-24 | Disposition: A | Discharge status: Home / Self Care | Payer: Medicare HMO | Source: Ambulatory Visit | Attending: Adult Health | Admitting: Adult Health

## 2016-10-24 ENCOUNTER — Encounter: Payer: Self-pay | Admitting: Adult Health

## 2016-10-24 ENCOUNTER — Ambulatory Visit (INDEPENDENT_AMBULATORY_CARE_PROVIDER_SITE_OTHER): Payer: Medicare HMO | Admitting: Adult Health

## 2016-10-24 VITALS — BP 100/60 | HR 70 | Ht 66.5 in | Wt 200.0 lb

## 2016-10-24 DIAGNOSIS — Z7689 Persons encountering health services in other specified circumstances: Secondary | ICD-10-CM

## 2016-10-24 DIAGNOSIS — Z124 Encounter for screening for malignant neoplasm of cervix: Secondary | ICD-10-CM

## 2016-10-24 DIAGNOSIS — Z1212 Encounter for screening for malignant neoplasm of rectum: Secondary | ICD-10-CM | POA: Diagnosis not present

## 2016-10-24 DIAGNOSIS — Z01419 Encounter for gynecological examination (general) (routine) without abnormal findings: Secondary | ICD-10-CM | POA: Insufficient documentation

## 2016-10-24 DIAGNOSIS — N920 Excessive and frequent menstruation with regular cycle: Secondary | ICD-10-CM

## 2016-10-24 DIAGNOSIS — Z1151 Encounter for screening for human papillomavirus (HPV): Secondary | ICD-10-CM | POA: Insufficient documentation

## 2016-10-24 DIAGNOSIS — Z113 Encounter for screening for infections with a predominantly sexual mode of transmission: Secondary | ICD-10-CM | POA: Diagnosis present

## 2016-10-24 DIAGNOSIS — Z1211 Encounter for screening for malignant neoplasm of colon: Secondary | ICD-10-CM

## 2016-10-24 LAB — HEMOCCULT GUIAC POC 1CARD (OFFICE): FECAL OCCULT BLD: NEGATIVE

## 2016-10-24 MED ORDER — NORETHIN-ETH ESTRAD-FE BIPHAS 1 MG-10 MCG / 10 MCG PO TABS: 1.0000 | ORAL_TABLET | Freq: Every day | ORAL | 11 refills | Status: DC

## 2016-10-24 NOTE — Patient Instructions (Addendum)
Start lo loestrin with next period Use condoms Follow up in 3 months Pap in 3 years if normal  Mammogram yearly Labs with PCP

## 2016-10-24 NOTE — Progress Notes (Signed)
Subjective:     Patient ID: [name], female   DOB: 03-03-1971, 46 y.o.   MRN: [id number]  HPI Mava is a 46 year old black female, G2P2, married, but lives in family care home, called [facility], referred by Dr Legrand Rams for pap.Had normal labs with Dr Legrand Rams. PCP is Dr Legrand Rams.   Review of Systems  Patient denies any daily headaches, hearing loss, fatigue, blurred vision, shortness of breath, chest pain, abdominal pain, problems with bowel movements, urination. No joint pain or mood swings(on meds).  +heavy periods, bleeds 5 days, 3 are heavy changes pads and tampons every 1-2 hours, she requests trying OCs for period regulation. No sex in 1.5 years  Reviewed past medical,surgical, social and family history. Reviewed medications and allergies.     Objective:   Physical Exam BP 100/60 (BP Location: Left Arm, Patient Position: Sitting, Cuff Size: Normal)   Pulse 70   Ht 5' 6.5" (1.689 m)   Wt 200 lb (90.7 kg)   LMP 10/10/2016 (Approximate)   BMI 31.80 kg/m  Skin warm and dry.  Lungs: clear to ausculation bilaterally. Cardiovascular: regular rate and rhythm.  Pelvic: external genitalia is normal in appearance no lesions, vagina: white discharge without odor,urethra has no lesions or masses noted, cervix:smooth and bulbous, Pap with HPV and GC/CHL performed,uterus: normal size, shape and contour, non tender, no masses felt, adnexa: no masses or tenderness noted. Bladder is non tender and no masses felt.On rectal exam has good tone, no hemorrhoids felt and hemoccult was negative. PHQ 9 score 11 and is on meds and is seen at [facility].   Assessment:     1. Routine cervical smear   2. Menorrhagia with regular cycle   3. Encounter for menstrual regulation   4. Screening for colorectal cancer       Plan:     Rx lo loestrin disp 1 pack take 1 daily with 12 refills, start with next period Use condoms if has sex Follow up in 3 months for ROS, may get [place] then if not better, she declines now,  wants to try OCs first.  Pap in 3 years if normal Mammogram yearly Labs with PCP

## 2016-10-26 ENCOUNTER — Telehealth: Payer: Self-pay | Admitting: *Deleted

## 2016-10-26 MED ORDER — NORETHIN ACE-ETH ESTRAD-FE 1-20 MG-MCG(24) PO TABS: 1.0000 | ORAL_TABLET | Freq: Every day | ORAL | 11 refills | Status: DC

## 2016-10-26 NOTE — Telephone Encounter (Signed)
Pt aware that junel called in

## 2016-10-26 NOTE — Telephone Encounter (Signed)
BCP prescribed are not covered by insurance. Please advise

## 2016-10-27 LAB — CYTOLOGY - PAP
ADEQUACY: ABSENT
CHLAMYDIA, DNA PROBE: NEGATIVE
DIAGNOSIS: NEGATIVE
HPV: NOT DETECTED
NEISSERIA GONORRHEA: NEGATIVE

## 2017-01-29 ENCOUNTER — Encounter: Payer: Self-pay | Admitting: Adult Health

## 2017-01-29 ENCOUNTER — Ambulatory Visit (INDEPENDENT_AMBULATORY_CARE_PROVIDER_SITE_OTHER): Payer: Medicare HMO | Admitting: Adult Health

## 2017-01-29 VITALS — BP 102/68 | HR 66 | Ht 66.0 in | Wt 199.0 lb

## 2017-01-29 DIAGNOSIS — Z7689 Persons encountering health services in other specified circumstances: Secondary | ICD-10-CM

## 2017-01-29 DIAGNOSIS — N926 Irregular menstruation, unspecified: Secondary | ICD-10-CM

## 2017-01-29 NOTE — Progress Notes (Signed)
Subjective:     Patient ID: [name], female   DOB: [birth year], 46 y.o.   MRN: [id number]  HPI [name] is a 46 year old black female, back in follow up of starting OCs for period management.She says period lighter and shorter, now lasts 3-4 days.  Review of Systems Periods shorter and lighter Reviewed past medical,surgical, social and family history. Reviewed medications and allergies.     Objective:   Physical Exam BP 102/68 (BP Location: Left Arm, Patient Position: Sitting, Cuff Size: Normal)   Pulse 66   Ht 5\' 6"  (1.676 m)   Wt 199 lb (90.3 kg)   LMP 01/10/2017 (Approximate)   BMI 32.12 kg/m    Talk only, periods better, wants to continue OCs.Will continue Junel, call with any concerns.  Assessment:     1. Encounter for menstrual regulation       Plan:    Continue junel Follow up in 6 months

## 2017-07-31 ENCOUNTER — Ambulatory Visit: Payer: Medicare Other | Admitting: Adult Health

## 2017-08-13 ENCOUNTER — Other Ambulatory Visit: Payer: Self-pay | Admitting: Internal Medicine

## 2017-08-13 DIAGNOSIS — Z1231 Encounter for screening mammogram for malignant neoplasm of breast: Secondary | ICD-10-CM

## 2017-08-16 ENCOUNTER — Encounter (HOSPITAL_COMMUNITY): Payer: Self-pay | Admitting: Emergency Medicine

## 2017-08-16 ENCOUNTER — Emergency Department (HOSPITAL_COMMUNITY)
Admission: EM | Admit: 2017-08-16 | Discharge: 2017-08-17 | Disposition: A | Discharge status: Home / Self Care | Payer: Medicare HMO | Attending: Emergency Medicine | Admitting: Emergency Medicine

## 2017-08-16 DIAGNOSIS — Z7982 Long term (current) use of aspirin: Secondary | ICD-10-CM | POA: Insufficient documentation

## 2017-08-16 DIAGNOSIS — J45909 Unspecified asthma, uncomplicated: Secondary | ICD-10-CM | POA: Insufficient documentation

## 2017-08-16 DIAGNOSIS — M542 Cervicalgia: Secondary | ICD-10-CM | POA: Diagnosis present

## 2017-08-16 DIAGNOSIS — Z79899 Other long term (current) drug therapy: Secondary | ICD-10-CM | POA: Diagnosis not present

## 2017-08-16 DIAGNOSIS — F250 Schizoaffective disorder, bipolar type: Secondary | ICD-10-CM | POA: Insufficient documentation

## 2017-08-16 DIAGNOSIS — E119 Type 2 diabetes mellitus without complications: Secondary | ICD-10-CM | POA: Diagnosis not present

## 2017-08-16 DIAGNOSIS — I10 Essential (primary) hypertension: Secondary | ICD-10-CM | POA: Insufficient documentation

## 2017-08-16 DIAGNOSIS — M62838 Other muscle spasm: Secondary | ICD-10-CM | POA: Diagnosis not present

## 2017-08-16 DIAGNOSIS — Z87891 Personal history of nicotine dependence: Secondary | ICD-10-CM | POA: Diagnosis not present

## 2017-08-16 NOTE — ED Triage Notes (Addendum)
Patient c/o multiple complaints and flight of ideas. Pt reports having neck pain onset of today. States she was recently seen for diarrhea. Pt states she now has an apartment and does not have hallucinations anymore. Pt repeatedly saying "I am just sick." pt adds her legs are achy. Hx of schizophrenia. Denies SI/HI.

## 2017-08-17 ENCOUNTER — Encounter (HOSPITAL_COMMUNITY): Payer: Self-pay | Admitting: Emergency Medicine

## 2017-08-17 ENCOUNTER — Ambulatory Visit (HOSPITAL_COMMUNITY)
Admission: RE | Admit: 2017-08-17 | Discharge: 2017-08-17 | Disposition: A | Discharge status: Home / Self Care | Payer: Medicare HMO | Source: Home / Self Care | Attending: Psychiatry | Admitting: Psychiatry

## 2017-08-17 DIAGNOSIS — Z91013 Allergy to seafood: Secondary | ICD-10-CM

## 2017-08-17 DIAGNOSIS — G4700 Insomnia, unspecified: Secondary | ICD-10-CM

## 2017-08-17 DIAGNOSIS — F250 Schizoaffective disorder, bipolar type: Secondary | ICD-10-CM

## 2017-08-17 DIAGNOSIS — I10 Essential (primary) hypertension: Secondary | ICD-10-CM | POA: Insufficient documentation

## 2017-08-17 DIAGNOSIS — Z853 Personal history of malignant neoplasm of breast: Secondary | ICD-10-CM

## 2017-08-17 DIAGNOSIS — Z79899 Other long term (current) drug therapy: Secondary | ICD-10-CM

## 2017-08-17 DIAGNOSIS — Z87891 Personal history of nicotine dependence: Secondary | ICD-10-CM | POA: Insufficient documentation

## 2017-08-17 DIAGNOSIS — E119 Type 2 diabetes mellitus without complications: Secondary | ICD-10-CM | POA: Insufficient documentation

## 2017-08-17 DIAGNOSIS — Z8042 Family history of malignant neoplasm of prostate: Secondary | ICD-10-CM | POA: Insufficient documentation

## 2017-08-17 DIAGNOSIS — Z818 Family history of other mental and behavioral disorders: Secondary | ICD-10-CM

## 2017-08-17 DIAGNOSIS — J45909 Unspecified asthma, uncomplicated: Secondary | ICD-10-CM

## 2017-08-17 DIAGNOSIS — Z880 Allergy status to penicillin: Secondary | ICD-10-CM | POA: Insufficient documentation

## 2017-08-17 DIAGNOSIS — Z888 Allergy status to other drugs, medicaments and biological substances status: Secondary | ICD-10-CM

## 2017-08-17 MED ORDER — IBUPROFEN 400 MG PO TABS: 800.0000 mg | ORAL_TABLET | Freq: Three times a day (TID) | ORAL | 0 refills | Status: DC

## 2017-08-17 MED ORDER — IBUPROFEN 800 MG PO TABS
800.0000 mg | ORAL_TABLET | Freq: Once | ORAL | Status: AC
  Administered 2017-08-17: 800 mg via ORAL
  Filled 2017-08-17: qty 1

## 2017-08-17 MED ORDER — ACETAMINOPHEN 500 MG PO TABS
1000.0000 mg | ORAL_TABLET | Freq: Once | ORAL | Status: AC
  Administered 2017-08-17: 1000 mg via ORAL
  Filled 2017-08-17: qty 2

## 2017-08-17 NOTE — H&P (Signed)
Behavioral Health Medical Screening Exam  [name] is an 46 y.o. female.  Total Time spent with patient: 15 minutes  Psychiatric Specialty Exam: Physical Exam  Constitutional: She is oriented to person, place, and time. She appears well-developed and well-nourished. No distress.  HENT:  Head: Normocephalic and atraumatic.  Right Ear: External ear normal.  Left Ear: External ear normal.  Eyes: Conjunctivae are normal. Right eye exhibits no discharge. Left eye exhibits no discharge. No scleral icterus.  Cardiovascular: Normal rate.  Respiratory: Effort normal. No respiratory distress.  Musculoskeletal: Normal range of motion.  Neurological: She is alert and oriented to person, place, and time.  Skin: Skin is warm and dry. She is not diaphoretic.  Psychiatric: Her speech is normal. Her affect is blunt. Thought content is not paranoid and not delusional. She does not express impulsivity or inappropriate judgment. She expresses no homicidal and no suicidal ideation.    Review of Systems  Musculoskeletal: Positive for myalgias.  Psychiatric/Behavioral: Positive for depression and hallucinations. Negative for memory loss and suicidal ideas. The patient is nervous/anxious and has insomnia.   All other systems reviewed and are negative.   Blood pressure 111/62, pulse 70, temperature 98 F (36.7 C), resp. rate 16, SpO2 99 %.There is no height or weight on file to calculate BMI.  General Appearance: Disheveled and Fairly Groomed  Eye Contact:  Good  Speech:  Clear and Coherent and Normal Rate  Volume:  Normal  Mood:  Depressed and Dysphoric  Affect:  Blunt  Thought Process:  Coherent, Goal Directed and Descriptions of Associations: Intact  Orientation:  Full (Time, Place, and Person)  Thought Content:  Logical and Hallucinations: Auditory  Suicidal Thoughts:  No  Homicidal Thoughts:  No  Memory:  Immediate;   Good Recent;   Good  Judgement:  Intact  Insight:  Good  Psychomotor  Activity:  Normal  Concentration: Concentration: Good and Attention Span: Good  Recall:  Good  Fund of Knowledge:Good  Language: Good  Akathisia:  No  Handed:  Right  AIMS (if indicated):     Assets:  Communication Skills Desire for Improvement Financial Resources/Insurance Housing Leisure Time Physical Health  Sleep:       Musculoskeletal: Strength & Muscle Tone: within normal limits Gait & Station: normal   Blood pressure 111/62, pulse 70, temperature 98 F (36.7 C), resp. rate 16, SpO2 99 %.  Recommendations:  Based on my evaluation the patient does not appear to have an emergency medical condition.  Rozetta Nunnery, NP 08/17/2017, 5:04 AM

## 2017-08-17 NOTE — ED Provider Notes (Signed)
[facility] DEPT Provider Note   CSN: [id number] Arrival date & time: 08/16/17  2043     History   Chief Complaint Chief Complaint  Patient presents with  . Neck Pain  . Leg Pain    HPI [name] is a 46 y.o. female.  The history is provided by the patient.  Neck Pain   This is a new problem. The current episode started 2 days ago. The problem occurs constantly. The problem has not changed since onset.The pain is associated with nothing. There has been no fever. The pain is present in the right side. The quality of the pain is described as cramping. Radiates to: does not radiate  The pain is at a severity of 4/10. The pain is moderate. The symptoms are aggravated by position. The pain is the same all the time. Pertinent negatives include no photophobia, no visual change, no chest pain, no syncope, no weight loss, no headaches, no bowel incontinence, no bladder incontinence, no paresis and no weakness. She has tried nothing for the symptoms. The treatment provided no relief.  Foot is itchy, there is no pain or swelling, no trauma.  No change in color.  She is also concerned that her under arms are dark in color.  No weakness no numbness.    Past Medical History:  Diagnosis Date  . Anemia   . Anxiety   . Asthma   . Bipolar 1 disorder ([place])   . Breast cancer ([place])   . Depression   . Diabetes mellitus without complication ([place])   . Hypertension   . Insomnia, persistent   . Schizophrenic disorder ([place])   . Seizures [facility])     Patient Active Problem List   Diagnosis Date Noted  . Noncompliance with treatment 07/16/2015  . Schizoaffective disorder, bipolar type ([place]) 01/11/2015    Past Surgical History:  Procedure Laterality Date  . BREAST SURGERY Left   . TONSILLECTOMY      OB History    Gravida Para Term Preterm AB Living   2 2 2     2    SAB TAB Ectopic Multiple Live Births           2       Home Medications    Prior to  Admission medications   Medication Sig Start Date End Date Taking? Authorizing Provider  ABILIFY MAINTENA 400 MG SUSR Inject 400 mg into the muscle every 30 (thirty) days.  07/16/15  Yes [provider]  ARIPiprazole (ABILIFY) 20 MG tablet Take 1 tablet (20 mg total) by mouth at bedtime. 09/11/15 08/16/17 Yes Patrecia Pour, NP  aspirin EC 325 MG tablet Take 325 mg by mouth as needed.    Yes [provider]  benztropine (COGENTIN) 1 MG tablet Take 1 mg by mouth at bedtime.  08/28/16  Yes [provider]  budesonide-formoterol (SYMBICORT) 160-4.5 MCG/ACT inhaler Inhale 2 puffs into the lungs 2 (two) times daily. For Asthma 01/20/15  Yes Rankin, Shuvon B, NP  divalproex (DEPAKOTE) 500 MG DR tablet Take 1,000 mg by mouth at bedtime.  09/11/16  Yes [provider]  naproxen (NAPROSYN) 500 MG tablet Take 1 tablet (500 mg total) by mouth 2 (two) times daily. Patient taking differently: Take 500 mg by mouth as needed.  06/28/16  Yes Nona Dell, PA-C  Norethindrone Acetate-Ethinyl Estrad-FE (JUNEL FE 24) 1-20 MG-MCG(24) tablet Take 1 tablet by mouth daily. 10/26/16  Yes Estill Dooms, NP  traZODone ([place])  150 MG tablet Take 1 tablet (150 mg total) by mouth at bedtime as needed for sleep. 09/11/15  Yes Patrecia Pour, NP  zolpidem (AMBIEN) 10 MG tablet Take 10 mg by mouth as needed.  09/11/16  Yes [provider]  divalproex (DEPAKOTE ER) 500 MG 24 hr tablet Take 1 tablet (500 mg total) by mouth 2 (two) times daily. 09/11/15 10/11/15  Patrecia Pour, NP  hydrOXYzine (ATARAX/VISTARIL) 25 MG tablet Take 1 tablet (25 mg total) by mouth 3 (three) times daily as needed for itching or anxiety (sleep). Patient not taking: Reported on 08/16/2017 09/11/15   Patrecia Pour, NP  ibuprofen (ADVIL,MOTRIN) 400 MG tablet Take 2 tablets (800 mg total) by mouth 3 (three) times daily. 08/17/17   Sara Keys, MD    Family History Family History  Problem Relation  Age of Onset  . Depression Mother   . Gout Mother   . Cancer Father        prostate  . Other Father        lung issue  . Alcoholism Other   . Heart attack Paternal Grandfather   . Heart attack Paternal Grandmother   . Heart attack Maternal Grandmother   . Heart attack Maternal Grandfather   . Depression Son   . Anxiety disorder Son     Social History Social History   Tobacco Use  . Smoking status: Former Smoker    Types: Cigarettes  . Smokeless tobacco: Never Used  Substance Use Topics  . Alcohol use: No  . Drug use: No     Allergies   Haldol [haloperidol decanoate]; Penicillins; Pollen extract; and Shrimp [shellfish allergy]   Review of Systems Review of Systems  Constitutional: Negative for weight loss.  Eyes: Negative for photophobia.  Cardiovascular: Negative for chest pain and syncope.  Gastrointestinal: Negative for bowel incontinence.  Genitourinary: Negative for bladder incontinence.  Musculoskeletal: Positive for neck pain. Negative for arthralgias, back pain, gait problem, joint swelling and neck stiffness.  Skin: Negative for pallor.  Neurological: Negative for weakness and headaches.  All other systems reviewed and are negative.    Physical Exam Updated Vital Signs BP 126/86 (BP Location: Left Arm)   Pulse 68   Temp 98.3 F (36.8 C) (Oral)   Resp 14   SpO2 100%   Physical Exam  Constitutional: She is oriented to person, place, and time. She appears well-developed and well-nourished. No distress.  HENT:  Head: Normocephalic and atraumatic.  Mouth/Throat: No oropharyngeal exudate.  Eyes: Conjunctivae are normal. Pupils are equal, round, and reactive to light.  Neck: Normal range of motion. Neck supple. No JVD present. No tracheal deviation present. No thyromegaly present.  Cardiovascular: Normal rate, regular rhythm, normal heart sounds and intact distal pulses.  Pulmonary/Chest: Effort normal and breath sounds normal. No stridor. She has no  wheezes. She has no rales.  Abdominal: Soft. Bowel sounds are normal. She exhibits no mass. There is no tenderness. There is no rebound and no guarding.  Musculoskeletal: Normal range of motion.       Left knee: Normal.       Left ankle: Normal.       Cervical back: Normal.       Thoracic back: Normal.       Lumbar back: Normal.       Back:       Left lower leg: Normal. She exhibits no tenderness, no bony tenderness, no swelling, no edema, no deformity and no laceration.  Left foot: Normal.  Lymphadenopathy:    She has no cervical adenopathy.  Neurological: She is alert and oriented to person, place, and time. She displays normal reflexes.  Skin: Skin is warm and dry. Capillary refill takes less than 2 seconds.  Psychiatric: She has a normal mood and affect.     ED Treatments / Results   Vitals:   08/16/17 2109 08/17/17 0057  BP: 130/86 126/86  Pulse: 78 68  Resp: 16 14  Temp: 98.3 F (36.8 C)   SpO2: 99% 100%    Procedures Procedures (including critical care time)  Medications Ordered in ED Medications  ibuprofen (ADVIL,MOTRIN) tablet 800 mg (800 mg Oral Given 08/17/17 0052)  acetaminophen (TYLENOL) tablet 1,000 mg (1,000 mg Oral Given 08/17/17 0052)       Final Clinical Impressions(s) / ED Diagnoses   Final diagnoses:  Muscle spasm   Return for fevers > 101, neck stiffness, chest pain, streaking up the leg, global weakness, stiff neck, intractable vomiting, or diarrhea, abdominal pain, Inability to tolerate liquids or food, cough, altered mental status or any concerns. No signs of systemic illness or infection. The patient is nontoxic-appearing on exam and vital signs are within normal limits.    I have reviewed the triage vital signs and the nursing notes. Pertinent labs &imaging results that were available during my care of the patient were reviewed by me and considered in my medical decision making (see chart for details).  After history, exam, and  medical workup I feel the patient has been appropriately medically screened and is safe for discharge home. Pertinent diagnoses were discussed with the patient. Patient was given return precautions  ED Discharge Orders        Ordered    ibuprofen (ADVIL,MOTRIN) 400 MG tablet  3 times daily     08/17/17 0134       Austyn Perriello, MD 08/17/17 [phone]

## 2017-08-17 NOTE — BH Assessment (Signed)
Assessment Note  [name] is an 46 y.o. female.  Patient came from [facility] after being discharged by Dr. Randal Buba.  She had gone there because she had physical pain and she made no mention of any mental health problems at that time.  Patient says that she has had some depression since the deaths of two relatives recently.  Patient says also she is concerned about the health of her father and uncle after they have had strokes.  Patient denies any HI, SI, A/V hallucinations.  She said that she has her own apartment and does not want to go back to living in a group home.  She has job assistance through programs at [employer].  Patient said that she did go to see Jinny Blossom for outpatient psychiatric care.  Her next appt is 09-16-17 for Abilify shot.  She has been to [facility] in the past multiple times.  -Clinician discussed patient care with Lindon Romp, FNP who did the MSE.  He did not see any need for inpatient care at this time.  Patient was given outpatient resources.  Diagnosis: F25.0 Schizoaffective d/o bipolar type  Past Medical History:  Past Medical History:  Diagnosis Date  . Anemia   . Anxiety   . Asthma   . Bipolar 1 disorder ([place])   . Breast cancer ([place])   . Depression   . Diabetes mellitus without complication ([place])   . Hypertension   . Insomnia, persistent   . Schizophrenic disorder ([place])   . Seizures ([place])     Past Surgical History:  Procedure Laterality Date  . BREAST SURGERY Left   . TONSILLECTOMY      Family History:  Family History  Problem Relation Age of Onset  . Depression Mother   . Gout Mother   . Cancer Father        prostate  . Other Father        lung issue  . Alcoholism Other   . Heart attack Paternal Grandfather   . Heart attack Paternal Grandmother   . Heart attack Maternal Grandmother   . Heart attack Maternal Grandfather   . Depression Son   . Anxiety disorder Son     Social History:  reports that she has quit smoking. Her smoking use  included cigarettes. she has never used smokeless tobacco. She reports that she does not drink alcohol or use drugs.  Additional Social History:  Alcohol / Drug Use Pain Medications: See PTA medication list Prescriptions: See PTA medication list Over the Counter: See PTA medication list. History of alcohol / drug use?: No history of alcohol / drug abuse  CIWA: CIWA-Ar BP: 111/62 Pulse Rate: 70 COWS:    Allergies:  Allergies  Allergen Reactions  . Haldol [Haloperidol Decanoate] Other (See Comments)    Stiffness, eyes bulging  . Penicillins Nausea And Vomiting    Has patient had a PCN reaction causing immediate rash, facial/tongue/throat swelling, SOB or lightheadedness with hypotension:UNSURE  Has patient had a PCN reaction causing severe rash involving mucus membranes or skin necrosis: UNSURE Has patient had a PCN reaction that required hospitalization:UNSURE Has patient had a PCN reaction occurring within the last 10 years:No If all of the above answers are "NO", then may proceed with Cephalosporin use. CHILDHOOD REACTION  . Pollen Extract   . Shrimp [Shellfish Allergy] Rash    Home Medications:  (Not in a hospital admission)  OB/GYN Status:  No LMP recorded. Patient is not currently having periods (Reason: Oral contraceptives).  General Assessment Data Location of Assessment: [facility] Assessment Services TTS Assessment: In system Is this a Tele or Face-to-Face Assessment?: Face-to-Face Is this an Initial Assessment or a Re-assessment for this encounter?: Initial Assessment Marital status: Single Is patient pregnant?: No Pregnancy Status: No Living Arrangements: Alone(Pt has her own apartment.) Can pt return to current living arrangement?: Yes Admission Status: Voluntary Is patient capable of signing voluntary admission?: Yes Referral Source: Self/Family/Friend Insurance type: Medicare  Medical Screening Exam ([place]) Medical Exam completed: Yes(Jason Gwenlyn Found,  FNP)  Crisis Care Plan Living Arrangements: Alone(Pt has her own apartment.) Name of Psychiatrist: Jinny Blossom Name of Therapist: None  Education Status Is patient currently in school?: No Highest grade of school patient has completed: 12th grade  Risk to self with the past 6 months Suicidal Ideation: No Has patient been a risk to self within the past 6 months prior to admission? : No Suicidal Intent: No Has patient had any suicidal intent within the past 6 months prior to admission? : No Is patient at risk for suicide?: No Suicidal Plan?: No Has patient had any suicidal plan within the past 6 months prior to admission? : No Access to Means: No What has been your use of drugs/alcohol within the last 12 months?: N/A Previous Attempts/Gestures: Yes How many times?: (Unknown) Other Self Harm Risks: None Triggers for Past Attempts: Unknown Intentional Self Injurious Behavior: None Family Suicide History: No Recent stressful life event(s): Loss (Comment)(Pt says two relatives died recently.) Persecutory voices/beliefs?: No Depression: Yes Depression Symptoms: Despondent, Loss of interest in usual pleasures, Feeling worthless/self pity Substance abuse history and/or treatment for substance abuse?: No Suicide prevention information given to non-admitted patients: Not applicable  Risk to Others within the past 6 months Homicidal Ideation: No Does patient have any lifetime risk of violence toward others beyond the six months prior to admission? : No Thoughts of Harm to Others: No Current Homicidal Intent: No Current Homicidal Plan: No Access to Homicidal Means: No Identified Victim: No one History of harm to others?: No Assessment of Violence: None Noted Violent Behavior Description: None reported Does patient have access to weapons?: No Criminal Charges Pending?: No Does patient have a court date: No Is patient on probation?: No  Psychosis Hallucinations: None  noted Delusions: None noted  Mental Status Report Appearance/Hygiene: Unremarkable Eye Contact: Good Motor Activity: Freedom of movement, Unsteady Speech: Logical/coherent, Soft Level of Consciousness: Alert Mood: Depressed, Anxious, Sad Affect: Appropriate to circumstance Anxiety Level: Minimal Thought Processes: Coherent, Relevant Judgement: Unimpaired Orientation: Appropriate for developmental age Obsessive Compulsive Thoughts/Behaviors: None  Cognitive Functioning Concentration: Normal Memory: Recent Intact, Remote Intact IQ: Average Insight: Good Impulse Control: Good Appetite: Good Weight Loss: 0 Weight Gain: 0 Sleep: No Change Total Hours of Sleep: 6 Vegetative Symptoms: None  ADLScreening [facility] Assessment Services) [name] cognitive ability adequate to safely complete daily activities?: Yes Patient able to express need for assistance with ADLs?: Yes Independently performs ADLs?: Yes (appropriate for developmental age)  Prior Inpatient Therapy Prior Inpatient Therapy: Yes Prior Therapy Dates: 4x in 2016; 1x in 2014, 2x in 2013, 2x in 2012; 1x in 2010 Prior Therapy Facilty/Provider(s): [facility] Reason for Treatment: Schizoaffective d/o  Prior Outpatient Therapy Prior Outpatient Therapy: Yes Prior Therapy Dates: October '18 to current Prior Therapy Facilty/Provider(s): [facility] Reason for Treatment: psychiatry Does patient have an ACCT team?: No Does patient have Intensive In-House Services?  : No Does patient have [facility] services? : No Does patient have P4CC services?: No  ADL  Screening (condition at time of admission) [name] cognitive ability adequate to safely complete daily activities?: Yes Is the patient deaf or have difficulty hearing?: No Does the patient have difficulty seeing, even when wearing glasses/contacts?: No Does the patient have difficulty concentrating, remembering, or making decisions?: No Patient able to express need for assistance  with ADLs?: Yes Does the patient have difficulty dressing or bathing?: No Independently performs ADLs?: Yes (appropriate for developmental age) Does the patient have difficulty walking or climbing stairs?: No Weakness of Legs: None Weakness of Arms/Hands: None       Abuse/Neglect Assessment (Assessment to be complete while patient is alone) Abuse/Neglect Assessment Can Be Completed: Yes Physical Abuse: Denies Verbal Abuse: Denies Sexual Abuse: Denies Exploitation of patient/patient's resources: Denies Self-Neglect: Denies     Regulatory affairs officer (For Healthcare) Does Patient Have a Medical Advance Directive?: No Would patient like information on creating a medical advance directive?: No - Patient declined    Additional Information 1:1 In Past 12 Months?: No CIRT Risk: No Elopement Risk: No Does patient have medical clearance?: Yes     Disposition:  Disposition Initial Assessment Completed for this Encounter: Yes Disposition of Patient: Discharge with Outpatient Resources  On Site Evaluation by:   Reviewed with Physician:    Raymondo Band 08/17/2017 5:47 AM

## 2017-09-17 ENCOUNTER — Telehealth: Payer: Self-pay | Admitting: Physician Assistant

## 2017-09-17 ENCOUNTER — Ambulatory Visit: Payer: Medicare Other | Admitting: Physician Assistant

## 2017-09-17 NOTE — Telephone Encounter (Signed)
Pt came in wanting to be administered an abilify shot. We do not keep those at our office. Pt was sent to [facility] for her shot per [employer].

## 2017-10-22 ENCOUNTER — Emergency Department (HOSPITAL_COMMUNITY)
Admission: EM | Admit: 2017-10-22 | Discharge: 2017-10-23 | Disposition: A | Discharge status: Home / Self Care | Payer: Medicare HMO | Attending: Emergency Medicine | Admitting: Emergency Medicine

## 2017-10-22 ENCOUNTER — Other Ambulatory Visit: Payer: Self-pay

## 2017-10-22 ENCOUNTER — Encounter (HOSPITAL_COMMUNITY): Payer: Self-pay

## 2017-10-22 DIAGNOSIS — Z853 Personal history of malignant neoplasm of breast: Secondary | ICD-10-CM | POA: Insufficient documentation

## 2017-10-22 DIAGNOSIS — I10 Essential (primary) hypertension: Secondary | ICD-10-CM | POA: Insufficient documentation

## 2017-10-22 DIAGNOSIS — Z87891 Personal history of nicotine dependence: Secondary | ICD-10-CM | POA: Insufficient documentation

## 2017-10-22 DIAGNOSIS — Z79899 Other long term (current) drug therapy: Secondary | ICD-10-CM | POA: Diagnosis not present

## 2017-10-22 DIAGNOSIS — E119 Type 2 diabetes mellitus without complications: Secondary | ICD-10-CM | POA: Diagnosis not present

## 2017-10-22 DIAGNOSIS — F250 Schizoaffective disorder, bipolar type: Secondary | ICD-10-CM | POA: Diagnosis not present

## 2017-10-22 DIAGNOSIS — Z008 Encounter for other general examination: Secondary | ICD-10-CM

## 2017-10-22 DIAGNOSIS — R440 Auditory hallucinations: Secondary | ICD-10-CM | POA: Diagnosis present

## 2017-10-22 NOTE — ED Notes (Signed)
Bed: [facility]3 Expected date:  Expected time:  Means of arrival:  Comments: 

## 2017-10-22 NOTE — ED Notes (Signed)
Bed: [facility]PT4 Expected date:  Expected time:  Means of arrival:  Comments: 

## 2017-10-22 NOTE — ED Provider Notes (Signed)
[facility] DEPT Provider Note   CSN: [id number] Arrival date & time: 10/22/17  2208     History   Chief Complaint Chief Complaint  Patient presents with  . Medical Clearance  . Schizophrenia    HPI [name] is a 47 y.o. female.  HPI  [name] is a 47 y.o. female, with a history of anemia, anxiety, asthma, bipolar, depression, DM, HTN, schizophrenia, and seizures, presenting to the ED, brought in by [facility] voluntarily.  Patient states she was "triggered" by her neighbor's (Mr. Moshe Cipro) behavior and statements and this made her angry. When asked about HI, she states, "I do not want to hurt anybody, however, I would defend myself, if needed."  During the interview patient is continually focused on this Mr. Moshe Cipro.  Every question I asked, she would relate it back to this person.  As an example, when she was asked if she was having auditory hallucinations, she replied, "I hear a little girl's voice.  I saw some hair in the stairwell.  I saw some roaches in Mr. Griffin Dakin apartment.  Why would he have broaches in his apartment and not care?  When I was a little girl in [place], when there were roaches in someone's apartment it was because of a dead body."  She states she would like to be evaluated by the counselor stating, "I just want to make sure I am okay."  She denies physical or sexual assault.  Denies SI.  Denies visual hallucinations.  Denies alcohol or illicit drug use.  Denies headaches, syncope, chest pain, shortness of breath, abdominal pain, falls/trauma, fever/chills, or any other complaints.  Past Medical History:  Diagnosis Date  . Anemia   . Anxiety   . Asthma   . Bipolar 1 disorder ([place])   . Breast cancer ([place])   . Depression   . Diabetes mellitus without complication ([place])   . Hypertension   . Insomnia, persistent   . Schizophrenic disorder ([place])   . Seizures [facility])     Patient Active Problem List   Diagnosis Date Noted    . Noncompliance with treatment 07/16/2015  . Schizoaffective disorder, bipolar type ([place]) 01/11/2015    Past Surgical History:  Procedure Laterality Date  . BREAST SURGERY Left   . TONSILLECTOMY      OB History    Gravida Para Term Preterm AB Living   2 2 2     2    SAB TAB Ectopic Multiple Live Births           2       Home Medications    Prior to Admission medications   Medication Sig Start Date End Date Taking? Authorizing Provider  ABILIFY MAINTENA 400 MG SUSR Inject 400 mg into the muscle every 30 (thirty) days.  07/16/15  Yes [provider]  albuterol (PROVENTIL HFA;VENTOLIN HFA) 108 (90 Base) MCG/ACT inhaler Inhale 2 puffs into the lungs every 6 (six) hours as needed for wheezing or shortness of breath.   Yes [provider]  aspirin EC 325 MG tablet Take 325 mg by mouth every 6 (six) hours as needed for mild pain or moderate pain.    Yes [provider]  benztropine (COGENTIN) 1 MG tablet Take 1 mg by mouth at bedtime.  08/28/16  Yes [provider]  budesonide-formoterol (SYMBICORT) 160-4.5 MCG/ACT inhaler Inhale 2 puffs into the lungs 2 (two) times daily. For Asthma 01/20/15  Yes Rankin, Shuvon B, NP  CRANBERRY PO  Take 1 tablet by mouth daily.   Yes [provider]  divalproex (DEPAKOTE ER) 500 MG 24 hr tablet Take 1 tablet (500 mg total) by mouth 2 (two) times daily. Patient taking differently: Take 1,000 mg by mouth at bedtime.  09/11/15 10/22/17 Yes Patrecia Pour, NP  divalproex (DEPAKOTE) 500 MG DR tablet Take 1,000 mg by mouth at bedtime.  09/11/16  Yes [provider]  montelukast (SINGULAIR) 10 MG tablet Take 10 mg by mouth at bedtime.   Yes [provider]  traZODone (DESYREL) 150 MG tablet Take 1 tablet (150 mg total) by mouth at bedtime as needed for sleep. 09/11/15  Yes Patrecia Pour, NP  zolpidem (AMBIEN) 10 MG tablet Take 10 mg by mouth at bedtime as needed for sleep.  09/11/16  Yes [provider]  ARIPiprazole (ABILIFY) 20 MG tablet Take 1 tablet (20 mg total) by mouth at bedtime. 09/11/15 08/16/17  Patrecia Pour, NP  hydrOXYzine (ATARAX/VISTARIL) 25 MG tablet Take 1 tablet (25 mg total) by mouth 3 (three) times daily as needed for itching or anxiety (sleep). Patient not taking: Reported on 08/16/2017 09/11/15   Patrecia Pour, NP  ibuprofen (ADVIL,MOTRIN) 400 MG tablet Take 2 tablets (800 mg total) by mouth 3 (three) times daily. Patient not taking: Reported on 10/22/2017 08/17/17   Palumbo, April, MD  naproxen (NAPROSYN) 500 MG tablet Take 1 tablet (500 mg total) by mouth 2 (two) times daily. Patient not taking: Reported on 10/22/2017 06/28/16   Nona Dell, PA-C  Norethindrone Acetate-Ethinyl Estrad-FE (JUNEL FE 24) 1-20 MG-MCG(24) tablet Take 1 tablet by mouth daily. Patient not taking: Reported on 10/22/2017 10/26/16   Estill Dooms, NP    Family History Family History  Problem Relation Age of Onset  . Depression Mother   . Gout Mother   . Cancer Father        prostate  . Other Father        lung issue  . Alcoholism Other   . Heart attack Paternal Grandfather   . Heart attack Paternal Grandmother   . Heart attack Maternal Grandmother   . Heart attack Maternal Grandfather   . Depression Son   . Anxiety disorder Son     Social History Social History   Tobacco Use  . Smoking status: Former Smoker    Types: Cigarettes  . Smokeless tobacco: Never Used  Substance Use Topics  . Alcohol use: No  . Drug use: No     Allergies   Haldol [haloperidol decanoate]; Penicillins; Pollen extract; and Shrimp [shellfish allergy]   Review of Systems Review of Systems  Constitutional: Negative for chills, diaphoresis and fever.  Respiratory: Negative for shortness of breath.   Cardiovascular: Negative for chest pain.  Gastrointestinal: Negative for abdominal pain, diarrhea, nausea and vomiting.  Neurological: Negative for dizziness, syncope,  weakness, light-headedness, numbness and headaches.  Psychiatric/Behavioral: Positive for hallucinations. Negative for suicidal ideas. The patient is nervous/anxious.   All other systems reviewed and are negative.    Physical Exam Updated Vital Signs BP 112/68 (BP Location: Left Arm)   Pulse 91   Temp 97.8 F (36.6 C) (Oral)   Ht 5\' 6"  (1.676 m)   Wt 100.2 kg (221 lb)   LMP 10/02/2017   SpO2 100%   BMI 35.67 kg/m   Physical Exam  Constitutional: She is oriented to person, place, and time. She appears well-developed and well-nourished. No distress.  HENT:  Head: Normocephalic and atraumatic.  Eyes: Conjunctivae and EOM are normal. Pupils are equal, round, and reactive to light.  Neck: Neck supple.  Cardiovascular: Normal rate, regular rhythm, normal heart sounds and intact distal pulses.  Pulmonary/Chest: Effort normal and breath sounds normal. No respiratory distress.  Abdominal: Soft. There is no tenderness. There is no guarding.  Musculoskeletal: She exhibits no edema.  Lymphadenopathy:    She has no cervical adenopathy.  Neurological: She is alert and oriented to person, place, and time.  No sensory deficits. Strength 5/5 in all extremities. No gait disturbance. Coordination intact including heel to shin and finger to nose. Cranial nerves III-XII grossly intact. No facial droop.   Skin: Skin is warm and dry. She is not diaphoretic.  Psychiatric: She has a normal mood and affect. Her behavior is normal. Her speech is tangential.  Patient is pleasant and makes good eye contact.  Nursing note and vitals reviewed.    ED Treatments / Results  Labs (all labs ordered are listed, but only abnormal results are displayed) Labs Reviewed  COMPREHENSIVE METABOLIC PANEL - Abnormal; Notable for the following components:      Result Value   Calcium 8.5 (*)    Total Protein 6.2 (*)    All other components within normal limits  CBC WITH DIFFERENTIAL/PLATELET - Abnormal; Notable for  the following components:   RBC 3.83 (*)    Hemoglobin 11.9 (*)    HCT 35.7 (*)    All other components within normal limits  ETHANOL  RAPID URINE DRUG SCREEN, HOSP PERFORMED  URINALYSIS, ROUTINE W REFLEX MICROSCOPIC  I-STAT BETA HCG BLOOD, ED ([facility], [facility], [facility] ONLY)    EKG  EKG Interpretation  Date/Time:  Tuesday October 23 2017 00:28:19 EST Ventricular Rate:  79 PR Interval:    QRS Duration: 91 QT Interval:  389 QTC Calculation: 446 R Axis:   0 Text Interpretation:  Sinus rhythm Consider left atrial enlargement Low voltage, extremity leads Consider anterior infarct No significant change since last tracing Confirmed by Ward, Cyril Mourning [phone]) on 10/23/2017 12:46:04 AM       Radiology No results found.  Procedures Procedures (including critical care time)  Medications Ordered in ED Medications  ARIPiprazole (ABILIFY) tablet 20 mg (not administered)  benztropine (COGENTIN) tablet 1 mg (not administered)  divalproex (DEPAKOTE ER) 24 hr tablet 500 mg (not administered)  montelukast (SINGULAIR) tablet 10 mg (not administered)  zolpidem (AMBIEN) tablet 10 mg (not administered)     Initial Impression / Assessment and Plan / ED Course  I have reviewed the triage vital signs and the nursing notes.  Pertinent labs & imaging results that were available during my care of the patient were reviewed by me and considered in my medical decision making (see chart for details).  Clinical Course as of Oct 23 105  Tue Oct 23, 2017  [place], [facility] counselor, states patient is recommended for inpatient treatment.  [SJ]    Clinical Course User Index [SJ] Mckenzie Toruno C, PA-C    Patient presents for medical clearance prior to psychiatric admission.  She requested to talk to a behavioral health counselor.  Following this interview, patient was recommended for inpatient therapy.  Patient medically cleared.  Placed in psych hold.  Home medications ordered.   Vitals:   10/22/17 2225 10/22/17  2226 10/23/17 0037  BP: 112/68  107/87  Pulse: 91  72  Resp:   18  Temp: 97.8 F (36.6 C)    TempSrc: Oral    SpO2: 100%  100%  Weight:  100.2 kg (221 lb)   Height:  5\' 6"  (1.676 m)         Final Clinical Impressions(s) / ED Diagnoses   Final diagnoses:  Medical clearance for psychiatric admission    ED Discharge Orders    None       Layla Maw 10/23/17 0107    Ward, Delice Bison, DO 10/23/17 0119

## 2017-10-22 NOTE — ED Notes (Signed)
TTS at bedside. 

## 2017-10-22 NOTE — ED Triage Notes (Addendum)
Pt arrived via [employer], in who were called by pt with complaint of a neighbor. Pt was voluntary and sked [employer] to bring her to [facility]. Pt reports that she is feeling a schizo-affective behavior reoccurring. Pt  reports that she has been having Auditory Hallucination, and pt is able to believe that  these things are not real. Pt denies SI. Pt is alert and oriented x 4 and is verbally responsive. Pt is cooperative and friendly. Pt is talkative and flight of  subject at time and needs some redirection. Pt reports that she feels as if these hallucination make her feel as someone may harm her and reenactment of things that have happened to her in the past. Pt reports compliancy with medications.  Pt reports that she was feeling as she was going to harm her neighbor                          ( Mr.Simpson) as " he was talking crazy, and knocking on the door, trying to ask her for sex and pay her" Pt reports that these words were angering her    PT desires that she wants to go to [facility], to be evaluated.

## 2017-10-23 DIAGNOSIS — F250 Schizoaffective disorder, bipolar type: Secondary | ICD-10-CM | POA: Diagnosis not present

## 2017-10-23 LAB — I-STAT BETA HCG BLOOD, ED (MC, WL, AP ONLY)

## 2017-10-23 LAB — CBC WITH DIFFERENTIAL/PLATELET
BASOS PCT: 0 %
Basophils Absolute: 0 10*3/uL (ref 0.0–0.1)
EOS ABS: 0.2 10*3/uL (ref 0.0–0.7)
EOS PCT: 3 %
HCT: 35.7 % — ABNORMAL LOW (ref 36.0–46.0)
HEMOGLOBIN: 11.9 g/dL — AB (ref 12.0–15.0)
LYMPHS ABS: 3.1 10*3/uL (ref 0.7–4.0)
Lymphocytes Relative: 49 %
MCH: 31.1 pg (ref 26.0–34.0)
MCHC: 33.3 g/dL (ref 30.0–36.0)
MCV: 93.2 fL (ref 78.0–100.0)
Monocytes Absolute: 0.4 10*3/uL (ref 0.1–1.0)
Monocytes Relative: 7 %
NEUTROS PCT: 41 %
Neutro Abs: 2.7 10*3/uL (ref 1.7–7.7)
PLATELETS: 215 10*3/uL (ref 150–400)
RBC: 3.83 MIL/uL — AB (ref 3.87–5.11)
RDW: 13 % (ref 11.5–15.5)
WBC: 6.5 10*3/uL (ref 4.0–10.5)

## 2017-10-23 LAB — RAPID URINE DRUG SCREEN, HOSP PERFORMED
AMPHETAMINES: NOT DETECTED
BARBITURATES: NOT DETECTED
BENZODIAZEPINES: NOT DETECTED
Cocaine: NOT DETECTED
Opiates: NOT DETECTED
TETRAHYDROCANNABINOL: NOT DETECTED

## 2017-10-23 LAB — URINALYSIS, ROUTINE W REFLEX MICROSCOPIC
BILIRUBIN URINE: NEGATIVE
Glucose, UA: NEGATIVE mg/dL
HGB URINE DIPSTICK: NEGATIVE
Ketones, ur: 5 mg/dL — AB
Leukocytes, UA: NEGATIVE
NITRITE: NEGATIVE
PROTEIN: NEGATIVE mg/dL
Specific Gravity, Urine: 1.027 (ref 1.005–1.030)
pH: 5 (ref 5.0–8.0)

## 2017-10-23 LAB — COMPREHENSIVE METABOLIC PANEL
ALBUMIN: 3.5 g/dL (ref 3.5–5.0)
ALT: 33 U/L (ref 14–54)
AST: 22 U/L (ref 15–41)
Alkaline Phosphatase: 42 U/L (ref 38–126)
Anion gap: 8 (ref 5–15)
BUN: 7 mg/dL (ref 6–20)
CHLORIDE: 110 mmol/L (ref 101–111)
CO2: 23 mmol/L (ref 22–32)
CREATININE: 0.68 mg/dL (ref 0.44–1.00)
Calcium: 8.5 mg/dL — ABNORMAL LOW (ref 8.9–10.3)
GFR calc Af Amer: >60 mL/min (ref >60)
GLUCOSE: 95 mg/dL (ref 65–99)
POTASSIUM: 3.5 mmol/L (ref 3.5–5.1)
Sodium: 141 mmol/L (ref 135–145)
Total Bilirubin: 0.3 mg/dL (ref 0.3–1.2)
Total Protein: 6.2 g/dL — ABNORMAL LOW (ref 6.5–8.1)

## 2017-10-23 LAB — ETHANOL

## 2017-10-23 MED ORDER — MONTELUKAST SODIUM 10 MG PO TABS: 10.0000 mg | ORAL_TABLET | Freq: Every day | ORAL | Status: DC

## 2017-10-23 MED ORDER — BENZTROPINE MESYLATE 1 MG PO TABS
1.0000 mg | ORAL_TABLET | Freq: Every day | ORAL | Status: DC
  Administered 2017-10-23: 1 mg via ORAL
  Filled 2017-10-23: qty 1

## 2017-10-23 MED ORDER — ZOLPIDEM TARTRATE 10 MG PO TABS
10.0000 mg | ORAL_TABLET | Freq: Every evening | ORAL | Status: DC | PRN
  Administered 2017-10-23: 10 mg via ORAL
  Filled 2017-10-23: qty 1

## 2017-10-23 MED ORDER — ARIPIPRAZOLE 10 MG PO TABS
20.0000 mg | ORAL_TABLET | Freq: Every day | ORAL | Status: DC
  Administered 2017-10-23: 20 mg via ORAL
  Filled 2017-10-23: qty 2

## 2017-10-23 MED ORDER — DIVALPROEX SODIUM ER 500 MG PO TB24
500.0000 mg | ORAL_TABLET | Freq: Two times a day (BID) | ORAL | Status: DC
  Administered 2017-10-23 (×2): 500 mg via ORAL
  Filled 2017-10-23 (×2): qty 1

## 2017-10-23 NOTE — ED Notes (Signed)
Pt A&O x 3, no distress noted, calm & cooperative, presents with Auditory hallucinations, talkative with flight of ideas.  Pt reports she is depressed.  Diagnosed with Bipolar and Schizoaffective DO.  Denies HI or SI, denies feeling hopeless.  Pt reports she has had difficulty sleeping.  Monitoring for safety, Q 15 min checks in effect.

## 2017-10-23 NOTE — BH Assessment (Signed)
[facility] Assessment Progress Note  Per Buford Dresser, DO, this pt does not require psychiatric hospitalization at this time.  Pt is to be discharged from [facility] with recommendation to continue treatment at [facility].  This has been included in pt's discharge instructions.  Pt's nurse, Langley Gauss, has been notified.  Jalene Mullet, [place] Triage Specialist [phone]

## 2017-10-23 NOTE — BHH Suicide Risk Assessment (Cosign Needed)
Suicide Risk Assessment  Discharge Assessment   [facility] Discharge Suicide Risk Assessment   Principal Problem: Schizoaffective disorder, bipolar type [facility]) Discharge Diagnoses:  Patient Active Problem List   Diagnosis Date Noted  . Noncompliance with treatment [Z91.19] 07/16/2015  . Schizoaffective disorder, bipolar type ([place]) [F25.0] 01/11/2015   Pt was seen and chart reviewed with Dr Mariea Clonts and treatment team. Pt stated she was worried about her neighbor because some bizarre things have been happening to him. Pt stated she works, lives alone, and takes her medications everyday. Pt stated she goes to [facility] for her Abilify Maintena shot. Pt stated she feels safe at home and is able to contract for safety. Pt's UDS and BAL are negative.  Pt denies suicidal/homicidal ideation, denies auditory/visual hallucinations and does not appear to be responding to internal stimuli.  Pt denies suicidal/homicidal ideation, denies auditory/visual hallucinations and does not appear to be responding to internal stimuli.  Pt is psychiatrically clear for discharge.   Total Time spent with patient: 30 minutes  Musculoskeletal: Strength & Muscle Tone: within normal limits Gait & Station: normal Patient leans: N/A  Psychiatric Specialty Exam:   Blood pressure 114/72, pulse 74, temperature 98.3 F (36.8 C), temperature source Oral, resp. rate 14, height 5\' 6"  (1.676 m), weight 100.2 kg (221 lb), last menstrual period 10/02/2017, SpO2 99 %.Body mass index is 35.67 kg/m.  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  Patients presenting with no risk factors but with morbid ruminations; may be classified as minimal risk based on the severity of the depressive symptoms    Plan Of Care/Follow-up recommendations:  Activity:  as tolerated Diet:  Heart Healthy  Ethelene Hal, NP 10/23/2017, 11:05 AM

## 2017-10-23 NOTE — ED Notes (Signed)
Patient denies pain and is resting comfortably.  

## 2017-10-23 NOTE — Discharge Instructions (Signed)
For you behavioral health needs, you are advised to continue treatment with [facility]:       [facility]      [address]      [place], [place] [address]      [phone]
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
## 2017-11-18 ENCOUNTER — Emergency Department (HOSPITAL_COMMUNITY)
Admission: EM | Admit: 2017-11-18 | Discharge: 2017-11-19 | Disposition: A | Discharge status: Other Acute Inpatient Hospital | Payer: Medicare HMO | Attending: Emergency Medicine | Admitting: Emergency Medicine

## 2017-11-18 ENCOUNTER — Encounter (HOSPITAL_COMMUNITY): Payer: Self-pay

## 2017-11-18 DIAGNOSIS — I10 Essential (primary) hypertension: Secondary | ICD-10-CM | POA: Diagnosis not present

## 2017-11-18 DIAGNOSIS — R45851 Suicidal ideations: Secondary | ICD-10-CM | POA: Diagnosis not present

## 2017-11-18 DIAGNOSIS — Z853 Personal history of malignant neoplasm of breast: Secondary | ICD-10-CM | POA: Diagnosis not present

## 2017-11-18 DIAGNOSIS — F250 Schizoaffective disorder, bipolar type: Secondary | ICD-10-CM | POA: Diagnosis present

## 2017-11-18 DIAGNOSIS — Z008 Encounter for other general examination: Secondary | ICD-10-CM

## 2017-11-18 DIAGNOSIS — F419 Anxiety disorder, unspecified: Secondary | ICD-10-CM | POA: Diagnosis not present

## 2017-11-18 DIAGNOSIS — Z046 Encounter for general psychiatric examination, requested by authority: Secondary | ICD-10-CM | POA: Diagnosis not present

## 2017-11-18 DIAGNOSIS — J45909 Unspecified asthma, uncomplicated: Secondary | ICD-10-CM | POA: Diagnosis not present

## 2017-11-18 DIAGNOSIS — Z87891 Personal history of nicotine dependence: Secondary | ICD-10-CM | POA: Diagnosis not present

## 2017-11-18 DIAGNOSIS — E119 Type 2 diabetes mellitus without complications: Secondary | ICD-10-CM | POA: Insufficient documentation

## 2017-11-18 DIAGNOSIS — Z818 Family history of other mental and behavioral disorders: Secondary | ICD-10-CM | POA: Diagnosis not present

## 2017-11-18 DIAGNOSIS — R454 Irritability and anger: Secondary | ICD-10-CM | POA: Diagnosis present

## 2017-11-18 DIAGNOSIS — F329 Major depressive disorder, single episode, unspecified: Secondary | ICD-10-CM | POA: Insufficient documentation

## 2017-11-18 DIAGNOSIS — R451 Restlessness and agitation: Secondary | ICD-10-CM | POA: Insufficient documentation

## 2017-11-18 DIAGNOSIS — R4689 Other symptoms and signs involving appearance and behavior: Secondary | ICD-10-CM

## 2017-11-18 DIAGNOSIS — R45850 Homicidal ideations: Secondary | ICD-10-CM | POA: Diagnosis not present

## 2017-11-18 DIAGNOSIS — Z79899 Other long term (current) drug therapy: Secondary | ICD-10-CM | POA: Diagnosis not present

## 2017-11-18 DIAGNOSIS — Z811 Family history of alcohol abuse and dependence: Secondary | ICD-10-CM | POA: Diagnosis not present

## 2017-11-18 LAB — COMPREHENSIVE METABOLIC PANEL
ALT: 19 U/L (ref 14–54)
ANION GAP: 10 (ref 5–15)
AST: 20 U/L (ref 15–41)
Albumin: 3.9 g/dL (ref 3.5–5.0)
Alkaline Phosphatase: 48 U/L (ref 38–126)
BUN: 11 mg/dL (ref 6–20)
CALCIUM: 9 mg/dL (ref 8.9–10.3)
CHLORIDE: 104 mmol/L (ref 101–111)
CO2: 25 mmol/L (ref 22–32)
Creatinine, Ser: 0.79 mg/dL (ref 0.44–1.00)
GFR calc non Af Amer: >60 mL/min (ref >60)
Glucose, Bld: 92 mg/dL (ref 65–99)
Potassium: 3.9 mmol/L (ref 3.5–5.1)
SODIUM: 139 mmol/L (ref 135–145)
Total Bilirubin: 0.6 mg/dL (ref 0.3–1.2)
Total Protein: 6.8 g/dL (ref 6.5–8.1)

## 2017-11-18 LAB — RAPID URINE DRUG SCREEN, HOSP PERFORMED
AMPHETAMINES: NOT DETECTED
BENZODIAZEPINES: NOT DETECTED
Barbiturates: NOT DETECTED
Cocaine: NOT DETECTED
Opiates: NOT DETECTED
Tetrahydrocannabinol: NOT DETECTED

## 2017-11-18 LAB — I-STAT BETA HCG BLOOD, ED (MC, WL, AP ONLY)

## 2017-11-18 LAB — CBC
HEMATOCRIT: 41.7 % (ref 36.0–46.0)
Hemoglobin: 13.5 g/dL (ref 12.0–15.0)
MCH: 30.8 pg (ref 26.0–34.0)
MCHC: 32.4 g/dL (ref 30.0–36.0)
MCV: 95.2 fL (ref 78.0–100.0)
PLATELETS: 254 10*3/uL (ref 150–400)
RBC: 4.38 MIL/uL (ref 3.87–5.11)
RDW: 12.8 % (ref 11.5–15.5)
WBC: 7.1 10*3/uL (ref 4.0–10.5)

## 2017-11-18 LAB — ETHANOL: Alcohol, Ethyl (B): <10 mg/dL (ref <10)

## 2017-11-18 MED ORDER — BENZTROPINE MESYLATE 1 MG PO TABS
1.0000 mg | ORAL_TABLET | Freq: Every day | ORAL | Status: DC
  Administered 2017-11-18: 1 mg via ORAL
  Filled 2017-11-18: qty 1

## 2017-11-18 MED ORDER — DIVALPROEX SODIUM ER 500 MG PO TB24
500.0000 mg | ORAL_TABLET | Freq: Two times a day (BID) | ORAL | Status: DC
  Administered 2017-11-18 – 2017-11-19 (×3): 500 mg via ORAL
  Filled 2017-11-18 (×3): qty 1

## 2017-11-18 MED ORDER — ASPIRIN EC 325 MG PO TBEC
325.0000 mg | DELAYED_RELEASE_TABLET | Freq: Four times a day (QID) | ORAL | Status: DC | PRN
  Filled 2017-11-18: qty 1

## 2017-11-18 MED ORDER — TRAZODONE HCL 50 MG PO TABS
150.0000 mg | ORAL_TABLET | Freq: Every evening | ORAL | Status: DC | PRN
  Administered 2017-11-18: 150 mg via ORAL
  Filled 2017-11-18: qty 1

## 2017-11-18 MED ORDER — HYDROXYZINE HCL 25 MG PO TABS: 25.0000 mg | ORAL_TABLET | Freq: Three times a day (TID) | ORAL | Status: DC | PRN

## 2017-11-18 MED ORDER — ARIPIPRAZOLE 10 MG PO TABS
20.0000 mg | ORAL_TABLET | Freq: Every day | ORAL | Status: DC
  Administered 2017-11-18: 20 mg via ORAL
  Filled 2017-11-18: qty 2

## 2017-11-18 MED ORDER — ACETAMINOPHEN 325 MG PO TABS: 650.0000 mg | ORAL_TABLET | ORAL | Status: DC | PRN

## 2017-11-18 NOTE — ED Provider Notes (Signed)
[facility] DEPT Provider Note   CSN: [id number] Arrival date & time: 11/18/17  9675     History   Chief Complaint Chief Complaint  Patient presents with  . Depression  . Anxiety    HPI [name] is a 47 y.o. female.  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  Patient called [employer] reporting that she was "having a psychiatric episode".  Patient reports that she was "triggered" by coworkers yesterday.  Patient works at [employer].  Patient reports that she is convinced that everyone around her is rumors about her.  Patient reports she cannot take the "pettyness" anymore in particular she is upset by her coworkers Estate agent and Von."  At times, patient reports wanting to hurt these individuals.  Patient reports that she has been compliant with her Abilify Maintena shot, but has stopped her Depakote 3 days ago.  Patient repeatedly changes her story,  at one point reporting that she is pitted against police, and at another point reporting that she will cooperate with police.  Patient reports that she has thoughts of harming herself, and  states that she thought about stabbing herself.  Patient denies clear HI, however makes threats of physical harm against multiple entities including coworkers, the police at times, and "everyone out there".  Patient clarifies multiple times that she is voluntary, and presented here voluntarily because she feels that her psychiatric condition is deteriorating.   Past Medical History:  Diagnosis Date  . Anemia   . Anxiety   . Asthma   . Bipolar 1 disorder ([place])   . Breast cancer ([place])   . Depression   . Diabetes mellitus without complication ([place])   . Hypertension   . Insomnia, persistent   . Schizophrenic disorder ([place])   . Seizures [facility])     Patient Active Problem List   Diagnosis Date Noted  . Noncompliance with  treatment 07/16/2015  . Schizoaffective disorder, bipolar type ([place]) 01/11/2015    Past Surgical History:  Procedure Laterality Date  . BREAST SURGERY Left   . TONSILLECTOMY       OB History    Gravida  2   Para  2   Term  2   Preterm      AB      Living  2     SAB      TAB      Ectopic      Multiple      Live Births  2            Home Medications    Prior to Admission medications   Medication Sig Start Date End Date Taking? Authorizing Provider  ABILIFY MAINTENA 400 MG SUSR Inject 400 mg into the muscle every 30 (thirty) days.  07/16/15  Yes [provider]  albuterol (PROVENTIL HFA;VENTOLIN HFA) 108 (90 Base) MCG/ACT inhaler Inhale 2 puffs into the lungs every 6 (six) hours as needed for wheezing or shortness of breath.   Yes [provider]  aspirin EC 325 MG tablet Take 325 mg by mouth every 6 (six) hours as needed for mild pain or moderate pain.    Yes [provider]  benztropine (COGENTIN) 1 MG tablet Take 1 mg by mouth at bedtime.  08/28/16  Yes [provider]  CRANBERRY PO Take 1 tablet by mouth daily.   Yes [provider]  montelukast (SINGULAIR) 10 MG tablet  Take 10 mg by mouth at bedtime.   Yes [provider]  traZODone (DESYREL) 150 MG tablet Take 1 tablet (150 mg total) by mouth at bedtime as needed for sleep. 09/11/15  Yes Lord, Asa Saunas, NP  ARIPiprazole (ABILIFY) 20 MG tablet Take 1 tablet (20 mg total) by mouth at bedtime. 09/11/15 08/16/17  Patrecia Pour, NP  budesonide-formoterol (SYMBICORT) 160-4.5 MCG/ACT inhaler Inhale 2 puffs into the lungs 2 (two) times daily. For Asthma Patient not taking: Reported on 11/18/2017 01/20/15   Rankin, Shuvon B, NP  divalproex (DEPAKOTE ER) 500 MG 24 hr tablet Take 1 tablet (500 mg total) by mouth 2 (two) times daily. Patient taking differently: Take 1,000 mg by mouth at bedtime.  09/11/15 10/22/17  Patrecia Pour, NP  divalproex (DEPAKOTE) 500 MG DR  tablet Take 1,000 mg by mouth at bedtime.  09/11/16   [provider]  hydrOXYzine (ATARAX/VISTARIL) 25 MG tablet Take 1 tablet (25 mg total) by mouth 3 (three) times daily as needed for itching or anxiety (sleep). Patient not taking: Reported on 08/16/2017 09/11/15   Patrecia Pour, NP  ibuprofen (ADVIL,MOTRIN) 400 MG tablet Take 2 tablets (800 mg total) by mouth 3 (three) times daily. Patient not taking: Reported on 10/22/2017 08/17/17   Palumbo, April, MD  naproxen (NAPROSYN) 500 MG tablet Take 1 tablet (500 mg total) by mouth 2 (two) times daily. Patient not taking: Reported on 10/22/2017 06/28/16   Nona Dell, PA-C  Norethindrone Acetate-Ethinyl Estrad-FE (JUNEL FE 24) 1-20 MG-MCG(24) tablet Take 1 tablet by mouth daily. Patient not taking: Reported on 10/22/2017 10/26/16   Estill Dooms, NP    Family History Family History  Problem Relation Age of Onset  . Depression Mother   . Gout Mother   . Cancer Father        prostate  . Other Father        lung issue  . Alcoholism Other   . Heart attack Paternal Grandfather   . Heart attack Paternal Grandmother   . Heart attack Maternal Grandmother   . Heart attack Maternal Grandfather   . Depression Son   . Anxiety disorder Son     Social History Social History   Tobacco Use  . Smoking status: Former Smoker    Types: Cigarettes  . Smokeless tobacco: Never Used  Substance Use Topics  . Alcohol use: No  . Drug use: No     Allergies   Haldol [haloperidol decanoate]; Penicillins; Pollen extract; and Shrimp [shellfish allergy]   Review of Systems Review of Systems  Respiratory: Negative for shortness of breath.   Cardiovascular: Negative for chest pain.  Neurological: Negative for headaches.  Psychiatric/Behavioral: Positive for agitation and behavioral problems. The patient is nervous/anxious and is hyperactive.   All other systems reviewed and are negative.    Physical Exam Updated Vital  Signs BP 114/81 (BP Location: Left Arm)   Pulse 82   Temp 97.8 F (36.6 C) (Oral)   Resp 16   SpO2 100%   Physical Exam  Constitutional: She appears well-developed and well-nourished.  Dressed in casual clothing.  Well-groomed.  Anxious appearing and with agitated movements.  HENT:  Head: Normocephalic and atraumatic.  Eyes: Conjunctivae are normal. Right eye exhibits no discharge. Left eye exhibits no discharge.  EOMs normal to gross examination.  Neck: Normal range of motion.  Cardiovascular: Normal rate, regular rhythm and normal heart sounds.  Pulmonary/Chest: Effort normal and breath sounds normal.  Normal respiratory  effort. Patient converses comfortably. No audible wheeze or stridor.  Abdominal: She exhibits no distension.  Musculoskeletal: Normal range of motion.  Neurological: She is alert.  Cranial nerves intact to gross observation. Patient moves extremities without difficulty.  Skin: Skin is warm and dry.  Psychiatric:  Labile affect.  Agitation noted.  Speech is rapid and pressured.  Thought content exhibits flight of ideas. Patient avoiding eye contact.  Nursing note and vitals reviewed.    ED Treatments / Results  Labs (all labs ordered are listed, but only abnormal results are displayed) Labs Reviewed  COMPREHENSIVE METABOLIC PANEL  ETHANOL  CBC  RAPID URINE DRUG SCREEN, HOSP PERFORMED  I-STAT BETA HCG BLOOD, ED ([facility], [facility], [facility] ONLY)    EKG None  Radiology No results found.  Procedures Procedures (including critical care time)  Medications Ordered in ED Medications - No data to display   Initial Impression / Assessment and Plan / ED Course  I have reviewed the triage vital signs and the nursing notes.  Pertinent labs & imaging results that were available during my care of the patient were reviewed by me and considered in my medical decision making (see chart for details).  Clinical Course as of Nov 18 929  Sun Nov 18, 2017  0930 Labwork is  normal today. Will assess and medically clear patient.   [AM]    Clinical Course User Index [AM] Albesa Seen, PA-C    I am concerned about patient's threatened physical harm against others.  Patient is making very violent statements and using violent language and many profanities. Unclear etiology of decompensation, but patient has recently self-discontinued mood stabilizing medications. Patient has a long history of frequent psychiatric decompensation. SI is passive at this time. Patient is fully voluntary at this time.  If patient attempts to leave, patient to require IVC.  Patient stopped her Depakote 3 days ago herself.  Will defer to psychiatry regarding your mood stabilization and restarting of this medication.  Spoke with Beecher Mcardle, [facility] Counselor regarding disposition and recommendation is inpatient. Case discussed with Waylan Boga, NP per Mr. Sprinkle. Appreciate their input in this patient's care. Agree with assessment.   Final Clinical Impressions(s) / ED Diagnoses   Final diagnoses:  Agitation  Change in behavior  Medical clearance for psychiatric admission    ED Discharge Orders    None       Tamala Julian 11/18/17 [id number]    Quintella Reichert, MD 11/19/17 [phone]

## 2017-11-18 NOTE — ED Notes (Signed)
TTS at bedside. 
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
## 2017-11-18 NOTE — ED Triage Notes (Signed)
Pt called [employer] and is here voluntarily because she's having anger issues and is concerned about her family Pt denies SI/HI

## 2017-11-18 NOTE — ED Notes (Signed)
Pt stated "I was on my way to [employer] and I just lost it.  I don't want to hurt myself or nobody else.  I just thought it was best for me to come here."

## 2017-11-18 NOTE — ED Notes (Signed)
Pt belongings placed in locker 33

## 2017-11-18 NOTE — ED Notes (Signed)
Pt up to window stating "can you change the channel off the Walking Dead or I'm going to kill myself."

## 2017-11-18 NOTE — ED Notes (Signed)
Bed: [facility]3 Expected date:  Expected time:  Means of arrival:  Comments: 

## 2017-11-18 NOTE — ED Notes (Signed)
Pt changed out and and wanded.

## 2017-11-18 NOTE — ED Notes (Signed)
Assumed care of patient. Pt alert and oriented. Resting quietly in bed. Denies any needs at this time. Will cont to care for pt.

## 2017-11-19 ENCOUNTER — Inpatient Hospital Stay (HOSPITAL_COMMUNITY)
Admission: AD | Admit: 2017-11-19 | Discharge: 2017-11-27 | DRG: 885 | Disposition: A | Discharge status: Home / Self Care | Payer: Medicare HMO | Source: Intra-hospital | Attending: Psychiatry | Admitting: Psychiatry

## 2017-11-19 ENCOUNTER — Encounter (HOSPITAL_COMMUNITY): Payer: Self-pay | Admitting: *Deleted

## 2017-11-19 ENCOUNTER — Other Ambulatory Visit: Payer: Self-pay

## 2017-11-19 DIAGNOSIS — G4700 Insomnia, unspecified: Secondary | ICD-10-CM | POA: Diagnosis present

## 2017-11-19 DIAGNOSIS — F250 Schizoaffective disorder, bipolar type: Secondary | ICD-10-CM | POA: Diagnosis present

## 2017-11-19 DIAGNOSIS — Y92009 Unspecified place in unspecified non-institutional (private) residence as the place of occurrence of the external cause: Secondary | ICD-10-CM

## 2017-11-19 DIAGNOSIS — F515 Nightmare disorder: Secondary | ICD-10-CM | POA: Diagnosis present

## 2017-11-19 DIAGNOSIS — Z818 Family history of other mental and behavioral disorders: Secondary | ICD-10-CM

## 2017-11-19 DIAGNOSIS — Z853 Personal history of malignant neoplasm of breast: Secondary | ICD-10-CM | POA: Diagnosis not present

## 2017-11-19 DIAGNOSIS — G259 Extrapyramidal and movement disorder, unspecified: Secondary | ICD-10-CM | POA: Diagnosis present

## 2017-11-19 DIAGNOSIS — R45851 Suicidal ideations: Secondary | ICD-10-CM | POA: Diagnosis present

## 2017-11-19 DIAGNOSIS — Z79899 Other long term (current) drug therapy: Secondary | ICD-10-CM | POA: Diagnosis not present

## 2017-11-19 DIAGNOSIS — Z7951 Long term (current) use of inhaled steroids: Secondary | ICD-10-CM | POA: Diagnosis not present

## 2017-11-19 DIAGNOSIS — J45909 Unspecified asthma, uncomplicated: Secondary | ICD-10-CM | POA: Diagnosis present

## 2017-11-19 DIAGNOSIS — R682 Dry mouth, unspecified: Secondary | ICD-10-CM | POA: Diagnosis present

## 2017-11-19 DIAGNOSIS — Z91048 Other nonmedicinal substance allergy status: Secondary | ICD-10-CM | POA: Diagnosis not present

## 2017-11-19 DIAGNOSIS — Z8042 Family history of malignant neoplasm of prostate: Secondary | ICD-10-CM

## 2017-11-19 DIAGNOSIS — Z87891 Personal history of nicotine dependence: Secondary | ICD-10-CM | POA: Diagnosis not present

## 2017-11-19 DIAGNOSIS — R45850 Homicidal ideations: Secondary | ICD-10-CM

## 2017-11-19 DIAGNOSIS — R451 Restlessness and agitation: Secondary | ICD-10-CM | POA: Diagnosis not present

## 2017-11-19 DIAGNOSIS — Z888 Allergy status to other drugs, medicaments and biological substances status: Secondary | ICD-10-CM

## 2017-11-19 DIAGNOSIS — R450 Nervousness: Secondary | ICD-10-CM | POA: Diagnosis not present

## 2017-11-19 DIAGNOSIS — Z880 Allergy status to penicillin: Secondary | ICD-10-CM

## 2017-11-19 DIAGNOSIS — Z8249 Family history of ischemic heart disease and other diseases of the circulatory system: Secondary | ICD-10-CM

## 2017-11-19 DIAGNOSIS — F419 Anxiety disorder, unspecified: Secondary | ICD-10-CM | POA: Diagnosis present

## 2017-11-19 DIAGNOSIS — Z9114 Patient's other noncompliance with medication regimen: Secondary | ICD-10-CM | POA: Diagnosis not present

## 2017-11-19 DIAGNOSIS — Z7982 Long term (current) use of aspirin: Secondary | ICD-10-CM

## 2017-11-19 DIAGNOSIS — E119 Type 2 diabetes mellitus without complications: Secondary | ICD-10-CM | POA: Diagnosis present

## 2017-11-19 DIAGNOSIS — Z91013 Allergy to seafood: Secondary | ICD-10-CM

## 2017-11-19 DIAGNOSIS — Z736 Limitation of activities due to disability: Secondary | ICD-10-CM | POA: Diagnosis not present

## 2017-11-19 DIAGNOSIS — F329 Major depressive disorder, single episode, unspecified: Secondary | ICD-10-CM | POA: Diagnosis present

## 2017-11-19 DIAGNOSIS — T426X6A Underdosing of other antiepileptic and sedative-hypnotic drugs, initial encounter: Secondary | ICD-10-CM | POA: Diagnosis present

## 2017-11-19 DIAGNOSIS — I10 Essential (primary) hypertension: Secondary | ICD-10-CM | POA: Diagnosis present

## 2017-11-19 DIAGNOSIS — Z91128 Patient's intentional underdosing of medication regimen for other reason: Secondary | ICD-10-CM

## 2017-11-19 DIAGNOSIS — Z811 Family history of alcohol abuse and dependence: Secondary | ICD-10-CM

## 2017-11-19 DIAGNOSIS — Z23 Encounter for immunization: Secondary | ICD-10-CM | POA: Diagnosis present

## 2017-11-19 DIAGNOSIS — R569 Unspecified convulsions: Secondary | ICD-10-CM | POA: Diagnosis present

## 2017-11-19 LAB — CBG MONITORING, ED: GLUCOSE-CAPILLARY: 85 mg/dL (ref 65–99)

## 2017-11-19 MED ORDER — ASPIRIN EC 325 MG PO TBEC: 325.0000 mg | DELAYED_RELEASE_TABLET | Freq: Four times a day (QID) | ORAL | Status: DC | PRN

## 2017-11-19 MED ORDER — TRAZODONE HCL 150 MG PO TABS
150.0000 mg | ORAL_TABLET | Freq: Every evening | ORAL | Status: DC | PRN
  Administered 2017-11-19: 150 mg via ORAL
  Filled 2017-11-19: qty 1

## 2017-11-19 MED ORDER — HYDROXYZINE HCL 25 MG PO TABS
25.0000 mg | ORAL_TABLET | Freq: Three times a day (TID) | ORAL | Status: DC | PRN
Start: 2017-11-19 — End: 2017-11-27
  Administered 2017-11-19 – 2017-11-26 (×4): 25 mg via ORAL
  Filled 2017-11-19 (×3): qty 1
  Filled 2017-11-19: qty 3
  Filled 2017-11-19: qty 1

## 2017-11-19 MED ORDER — BENZTROPINE MESYLATE 1 MG PO TABS
1.0000 mg | ORAL_TABLET | Freq: Every day | ORAL | Status: DC
  Administered 2017-11-19: 1 mg via ORAL
  Filled 2017-11-19 (×4): qty 1

## 2017-11-19 MED ORDER — ACETAMINOPHEN 325 MG PO TABS: 650.0000 mg | ORAL_TABLET | ORAL | Status: DC | PRN

## 2017-11-19 MED ORDER — DIVALPROEX SODIUM ER 500 MG PO TB24
500.0000 mg | ORAL_TABLET | Freq: Two times a day (BID) | ORAL | Status: DC
  Administered 2017-11-19 – 2017-11-27 (×16): 500 mg via ORAL
  Filled 2017-11-19 (×21): qty 1

## 2017-11-19 MED ORDER — ARIPIPRAZOLE 10 MG PO TABS
20.0000 mg | ORAL_TABLET | Freq: Every day | ORAL | Status: DC
  Administered 2017-11-19 – 2017-11-26 (×8): 20 mg via ORAL
  Filled 2017-11-19 (×10): qty 2
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
## 2017-11-19 NOTE — BH Assessment (Signed)
[facility] Assessment Progress Note  Per Buford Dresser, DO, this pt requires psychiatric hospitalization at this time.  Leonia Reader, RN, [facility] has assigned pt to [facility] Rm [id number]; [place] will be ready to receive pt at 15:30.  Pt has signed Voluntary Admission and Consent for Treatment, as well as Consent to Release Information to [facility], and to several family members, and a notification call has been placed to the provider.  Signed forms have been faxed to [facility].  Pt's nurse, Caryl Pina, has been notified, and agrees to send original paperwork along with pt via [facility], and to call report to [phone].  Jalene Mullet, [place] Coordinator [phone]

## 2017-11-19 NOTE — Progress Notes (Signed)
Adult Psychoeducational Group Note  Date:  11/19/2017 Time:  9:04 PM  Group Topic/Focus:  Wrap-Up Group:   The focus of this group is to help patients review their daily goal of treatment and discuss progress on daily workbooks.  Participation Level:  Active  Participation Quality:  Intrusive and Monopolizing  Affect:  Excited  Cognitive:  Oriented  Insight: Limited  Engagement in Group:  Engaged and Off Topic  Modes of Intervention:  Limit-setting, Socialization and Support  Additional Comments:  Pt attended and participated in group tonight. Pt. reports she was able to relate and socialize with a peer. She had a good time and was able to maintain boundaries. She went to dinner.  Salley Scarlet [facility] 11/19/2017, 9:04 PM
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
## 2017-11-19 NOTE — ED Notes (Signed)
[facility] transport on unit to transfer pt to [facility] Adult unit per MD order. Pt signed for personal property and property given to transport for transfer. Pt signed e-signature. Ambulatory off unit.  

## 2017-11-19 NOTE — Plan of Care (Signed)
Nurse discussed depression, anxiety, coping skills with patient.  

## 2017-11-19 NOTE — ED Notes (Addendum)
Pt with pressured & disorganized speech.

## 2017-11-20 DIAGNOSIS — Z818 Family history of other mental and behavioral disorders: Secondary | ICD-10-CM

## 2017-11-20 DIAGNOSIS — Z811 Family history of alcohol abuse and dependence: Secondary | ICD-10-CM

## 2017-11-20 DIAGNOSIS — F419 Anxiety disorder, unspecified: Secondary | ICD-10-CM

## 2017-11-20 DIAGNOSIS — Z736 Limitation of activities due to disability: Secondary | ICD-10-CM

## 2017-11-20 DIAGNOSIS — F250 Schizoaffective disorder, bipolar type: Principal | ICD-10-CM

## 2017-11-20 DIAGNOSIS — R450 Nervousness: Secondary | ICD-10-CM

## 2017-11-20 DIAGNOSIS — Z9114 Patient's other noncompliance with medication regimen: Secondary | ICD-10-CM

## 2017-11-20 MED ORDER — HYDROXYZINE HCL 50 MG PO TABS
50.0000 mg | ORAL_TABLET | Freq: Once | ORAL | Status: AC
  Administered 2017-11-20: 50 mg via ORAL
  Filled 2017-11-20 (×2): qty 1

## 2017-11-20 MED ORDER — OLANZAPINE 10 MG PO TBDP
10.0000 mg | ORAL_TABLET | Freq: Three times a day (TID) | ORAL | Status: DC | PRN
  Administered 2017-11-20 – 2017-11-22 (×3): 10 mg via ORAL
  Filled 2017-11-20: qty 1
  Filled 2017-11-20: qty 2

## 2017-11-20 MED ORDER — ZIPRASIDONE MESYLATE 20 MG IM SOLR: 20.0000 mg | INTRAMUSCULAR | Status: DC | PRN

## 2017-11-20 MED ORDER — LORAZEPAM 1 MG PO TABS
1.0000 mg | ORAL_TABLET | ORAL | Status: AC | PRN
  Administered 2017-11-20: 1 mg via ORAL
  Filled 2017-11-20: qty 1

## 2017-11-20 MED ORDER — BENZTROPINE MESYLATE 1 MG PO TABS: 1.0000 mg | ORAL_TABLET | Freq: Two times a day (BID) | ORAL | Status: DC | PRN

## 2017-11-20 MED ORDER — TRAZODONE HCL 50 MG PO TABS
50.0000 mg | ORAL_TABLET | Freq: Every evening | ORAL | Status: DC | PRN
  Administered 2017-11-20 – 2017-11-24 (×5): 50 mg via ORAL
  Filled 2017-11-20 (×3): qty 1
  Filled 2017-11-20: qty 4

## 2017-11-20 MED ORDER — LORAZEPAM 1 MG PO TABS
1.0000 mg | ORAL_TABLET | Freq: Once | ORAL | Status: AC
  Administered 2017-11-20: 1 mg via ORAL
  Filled 2017-11-20: qty 1

## 2017-11-20 NOTE — Progress Notes (Addendum)
Pt up , agitated needing re-direction, " I'm 47 years old and I need something to help me sleep, I'm not having sex". Pt presents very paranoid accusing staff members of taking her glasses and putting them in the dayroom. Pt very disorganized, loose associations, flight of ideas.

## 2017-11-20 NOTE — [facility] Group Notes (Signed)
LCSW Group Therapy Notes 11/20/2017 1:15pm Type of Therapy and Topic:  Group Therapy:  Communication Participation Level:  Active  Description of Group: Patients will identify how individuals communicate with one another appropriately and inappropriately.  Patients will be guided to discuss their thoughts, feelings and behaviors related to barriers when communicating.  The group will process together ways to execute positive and appropriate communication with attention given to how one uses behavior, tone and body language.  Patients will be encouraged to reflect on a situation where they were successfully able to communicate and what made this example successful.  Group will identify specific changes they are motivated to make in order to overcome communication barriers with self, peers, authority, and parents.  This group will be process-oriented with patients participating in exploration of their own experiences, giving and receiving support, and challenging self and other group members.   Therapeutic Goals 1. Patient will identify how people communicate (body language, facial expression, and electronics).  Group will also discuss tone, voice and how these impact what is communicated and what is received. 2. Patient will identify feelings (such as fear or worry), thought process and behaviors related to why people internalize feelings rather than express self openly. 3. Patient will identify two changes they are willing to make to overcome communication barriers 4. Members will then practice through role play how to communicate using I statements, I feel statements, and acknowledging feelings rather than displacing feelings on others Summary of Patient Progress:  Stayed the entire time, pressured, tangential, disorganized and intrusive the entire time.  Others demonstrated much patience for her behavior.  Therapeutic Modalities Cognitive Behavioral Therapy Motivational Interviewing Solution  Focused [place], LCSW 11/20/2017 1:11 PM

## 2017-11-20 NOTE — [facility] Suicide Risk Assessment (Signed)
[facility] Admission Suicide Risk Assessment   Nursing information obtained from:    Demographic factors:  Low socioeconomic status, Living alone Current Mental Status:  NA Loss Factors:  NA Historical Factors:  Family history of suicide Risk Reduction Factors:  Sense of responsibility to family, Positive social support  Total Time spent with patient: 1 hour Principal Problem: Schizoaffective disorder, bipolar type ([place]) Diagnosis:   Patient Active Problem List   Diagnosis Date Noted  . Noncompliance with treatment [Z91.19] 07/16/2015  . Schizoaffective disorder, bipolar type ([place]) [F25.0] 01/11/2015   Subjective Data: See H&P for details   Continued Clinical Symptoms:    The "Alcohol Use Disorders Identification Test", Guidelines for Use in Primary Care, Second Edition.  World Pharmacologist [facility]). Score between 0-7:  no or low risk or alcohol related problems. Score between 8-15:  moderate risk of alcohol related problems. Score between 16-19:  high risk of alcohol related problems. Score 20 or above:  warrants further diagnostic evaluation for alcohol dependence and treatment.   CLINICAL FACTORS:   Severe Anxiety and/or Agitation Bipolar Disorder:   Mixed State Schizophrenia:   Paranoid or undifferentiated type    Psychiatric Specialty Exam: Physical Exam  Nursing note and vitals reviewed.   ROS - see H&P for details  Blood pressure 114/71, pulse (!) 111, temperature 98.2 F (36.8 C), temperature source Oral, resp. rate 18, height 5\' 6"  (1.676 m), weight 100.2 kg (221 lb).Body mass index is 35.67 kg/m.      COGNITIVE FEATURES THAT CONTRIBUTE TO RISK:  None    SUICIDE RISK:   Minimal: No identifiable suicidal ideation.  Patients presenting with no risk factors but with morbid ruminations; may be classified as minimal risk based on the severity of the depressive symptoms  PLAN OF CARE: See H&P for details  I certify that inpatient services furnished can  reasonably be expected to improve the patient's condition.   Pennelope Bracken, MD 11/20/2017, 4:00 PM
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
## 2017-11-20 NOTE — Progress Notes (Signed)
D: Pt denies SI/HI/AVH. Pt is pleasant and cooperative. Pt  Labile, hyper verbal, loose associations flight of ideas. Pt intrusive and has disorganized thought process at times. Pt was irritable earlier at staff because she stated she was not allowed to speak in group like she wanted, but staff revealed pt was manipulating the group this evening. Pt had to be mad a no roommate due to her delusions, paranoia when we tried to put another person in her room. Pt became agitated , verbally aggressive needing verbal de-escalation.   A: Pt was offered support and encouragement. Pt was given scheduled medications. Pt was encourage to attend groups. Q 15 minute checks were done for safety.   R:Pt attends groups and interacts well with peers and staff. Pt is taking medication. Pt receptive to treatment and safety maintained on unit.

## 2017-11-20 NOTE — Progress Notes (Signed)
The patient spoke about a variety of topics and was difficult to follow. She began by stating that her day was a 7 or 8 out of 10. She then mentioned that she was concerned about her brother who is alive and that she was feeling anxious about some issues at work. The patient did not go into further detail. Patient expressed an interest in attending therapy through the outpatient program here but is concerned about the cost. The patient also indicated that she was trying to figure about the meaning of "love".

## 2017-11-20 NOTE — Progress Notes (Signed)
Recreation Therapy Notes  Date: 3.26.19 Time: 10:00 a.m.  Location: [address] Dayroom  Group Topic: Social Skills   Goal Area(s) Addresses:  Goal 1.1: To improve social skills  - Patient will participate in Recreation Therapy group tx. - Patient will identify the importance of good social skills  - Patient will communicate with peers during group activity   Behavioral Response: Appropriate  Intervention: Game   Activity: "I have it": Group were handed all the cards from a deck. The first patient rolled the dice to try to match the number of any of the cards they have. If they have it, they say I've got it and throws in the center, and continues to roll, if they don't have it, then it goes around to the next player that does.   Education: Social Skills   Education Outcome: Acknowledges Education  Clinical Observations/Feedback: Patient attended and participated appropriately during Recreation Therapy group treatment. Patient communicated with peers in a appropriate manner. Patient was able to identify the importance of having good social skills. Patient successfully met Goal 1.1 (see above).    Ranell Patrick, Recreation Therapy Intern   Ranell Patrick 11/20/2017 9:08 AM

## 2017-11-20 NOTE — Progress Notes (Signed)
DAR NOTE: Patient presents with anxious affect and mood.  Denies pain, auditory and visual hallucinations.  Reports suicidal thoughts but verbally contracts for safety.  Described energy level as hyper and concentration as poor.  Rates depression at 9.5, hopelessness at 9.5, and anxiety at 9.5.  Maintained on routine safety checks.  Medications given as prescribed.  Support and encouragement offered as needed.  Attended group and participated.  States goal for today is "to communicate more efficiently for myself."  Patient visible in milieu for therapy and activities.  Interacting with peers in the dayroom.  Offered no complaint.

## 2017-11-20 NOTE — H&P (Signed)
Psychiatric Admission Assessment Adult  Patient Identification: [name] MRN:  [id number] Date of Evaluation:  11/20/2017 Chief Complaint:  schizoaffective disorder bipolar type  Principal Diagnosis: Schizoaffective disorder, bipolar type ([place]) Diagnosis:   Patient Active Problem List   Diagnosis Date Noted  . Noncompliance with treatment [Z91.19] 07/16/2015  . Schizoaffective disorder, bipolar type ([place]) [F25.0] 01/11/2015   History of Present Illness:   [name] is a 47 y/o F with history of schizoaffective disorder, bipolar type who was admitted voluntarily from [facility] ED with worsening symptoms of agitation, depression, disorganization, SI with plan to stab self, HI towards her co-workers, and poor adherence to her home regimen of depakote. Pt had called [facility] with concern that she was having worsening symptoms and she cited stressor of conflict with her co-workers at [employer]. Pt was restarted on previous home medications of abilify and depakote, and she was transferred to [facility] for additional treatment and stabilization.  Upon initial presentation, pt is somewhat drowsy and she is generally a poor historian. Her speech is pressured and she is tangential and somewhat disorganized. She is labile and tearful at times during interview. Pt was asked to to share the narrative of what brought her to the hospital, and she shares, "I just had a lot of stress. I had a psychiatric episode, but maybe it's a mid-life crisis." Pt shares that she had stressor of conflict with co-workers at [employer], and she describes, "foreign people talking about me," as a trigger for her worsening mood symptoms, but she also cites strained relationship with her husband. She endorses having SI with thought to stab herself, but she denies any intent of following through with her thoughts. She voiced HI towards her co-workers in ED, but she denies any HI today upon evaluation. She denies AH/VH, but she describes  hearing her brother yelling in the hallways of her apartment building . She endorses some paranoia and she provides example of "someone in my family is doing incest." She reports her sleep has been good and her appetite is good. She denies other symptoms of depression. She has distractibility. She has flight of ideas and pressured speech. She denies symptoms of OCD and PTSD. She denies illicit substance use.  Discussed with patient about treatment options. She reports good adherence to outpatient regimen of abilify Maintena with last injection received on 3/20. She reports that she stopped depakote about 3 days prior to admission. She was restarted on oral abilify 20mg  po qDay while in the ED prior to transfer to [facility] as well as being restarted on previous home dose of depakote 500mg  po BID. Pt is in agreement to continue this current regimen without changes while in the hospital, and she had no further questions, comments, or concerns.  Associated Signs/Symptoms: Depression Symptoms:  suicidal thoughts with specific plan, anxiety, (Hypo) Manic Symptoms:  Delusions, Distractibility, Flight of Ideas, Hallucinations, Impulsivity, Irritable Mood, Labiality of Mood, Anxiety Symptoms:  Excessive Worry, Psychotic Symptoms:  Delusions, Hallucinations: Auditory Paranoia, PTSD Symptoms: NA Total Time spent with patient: 1 hour  Past Psychiatric History:  - previous diagnosis of schizoaffective disorder bipolar type - current outpatient with [facility] - multiple previous inpatient hospitalizations - 2 previous suicide attempts (age 72 and ~age 3)  Is the patient at risk to self? Yes.    Has the patient been a risk to self in the past 6 months? Yes.    Has the patient been a risk to self within the distant past? Yes.  Is the patient a risk to others? Yes.    Has the patient been a risk to others in the past 6 months? Yes.    Has the patient been a risk to others within the distant past? Yes.      Prior Inpatient Therapy:   Prior Outpatient Therapy:    Alcohol Screening: 3. How often do you have six or more drinks on one occasion?: Never Substance Abuse History in the last 12 months:  No. Consequences of Substance Abuse: NA Previous Psychotropic Medications: Yes  Psychological Evaluations: Yes  Past Medical History:  Past Medical History:  Diagnosis Date  . Anemia   . Anxiety   . Asthma   . Bipolar 1 disorder ([place])   . Breast cancer ([place])   . Depression   . Diabetes mellitus without complication ([place])   . Hypertension   . Insomnia, persistent   . Schizophrenic disorder ([place])   . Seizures ([place])     Past Surgical History:  Procedure Laterality Date  . BREAST SURGERY Left   . TONSILLECTOMY     Family History:  Family History  Problem Relation Age of Onset  . Depression Mother   . Gout Mother   . Cancer Father        prostate  . Other Father        lung issue  . Alcoholism Other   . Heart attack Paternal Grandfather   . Heart attack Paternal Grandmother   . Heart attack Maternal Grandmother   . Heart attack Maternal Grandfather   . Depression Son   . Anxiety disorder Son    Family Psychiatric  History: hx of depression in mother Tobacco Screening:   Social History:  Pt is from the [place] area. She lives on her own in an apartment. She receives disability but works at [employer]. She is married but separated, and she has 2 children ages 37 and 68 whom live with her mother. She denies legal and trauma history. Social History   Substance and Sexual Activity  Alcohol Use No     Social History   Substance and Sexual Activity  Drug Use No    Additional Social History:                           Allergies:   Allergies  Allergen Reactions  . Haldol [Haloperidol Decanoate] Other (See Comments)    Stiffness, eyes bulging  . Penicillins Nausea And Vomiting    Has patient had a PCN reaction causing immediate rash, facial/tongue/throat  swelling, SOB or lightheadedness with hypotension:UNSURE  Has patient had a PCN reaction causing severe rash involving mucus membranes or skin necrosis: UNSURE Has patient had a PCN reaction that required hospitalization:UNSURE Has patient had a PCN reaction occurring within the last 10 years:No If all of the above answers are "NO", then may proceed with Cephalosporin use. CHILDHOOD REACTION  . Pollen Extract   . Shrimp [Shellfish Allergy] Rash   Lab Results:  Results for orders placed or performed during the hospital encounter of 11/18/17 (from the past 48 hour(s))  POC CBG, ED     Status: None   Collection Time: 11/19/17  1:14 AM  Result Value Ref Range   Glucose-Capillary 85 65 - 99 mg/dL    Blood Alcohol level:  Lab Results  Component Value Date   ETH <10 11/18/2017   ETH <10 94/70/9628    Metabolic Disorder Labs:  Lab Results  Component  Value Date   HGBA1C 5.9 (H) 01/15/2015   MPG 123 01/15/2015   MPG 117 (H) 04/21/2011   No results found for: PROLACTIN Lab Results  Component Value Date   CHOL 172 01/15/2015   TRIG 109 01/15/2015   HDL 39 (L) 01/15/2015   CHOLHDL 4.4 01/15/2015   VLDL 22 01/15/2015   LDLCALC 111 (H) 01/15/2015   LDLCALC 114 (H) 04/21/2011    Current Medications: Current Facility-Administered Medications  Medication Dose Route Frequency Provider Last Rate Last Dose  . acetaminophen (TYLENOL) tablet 650 mg  650 mg Oral Q4H PRN Patrecia Pour, NP      . ARIPiprazole (ABILIFY) tablet 20 mg  20 mg Oral QHS Patrecia Pour, NP   20 mg at 11/19/17 2244  . aspirin EC tablet 325 mg  325 mg Oral Q6H PRN Patrecia Pour, NP      . benztropine (COGENTIN) tablet 1 mg  1 mg Oral QHS Patrecia Pour, NP   1 mg at 11/19/17 2244  . divalproex (DEPAKOTE ER) 24 hr tablet 500 mg  500 mg Oral BID Patrecia Pour, NP   500 mg at 11/20/17 0732  . hydrOXYzine (ATARAX/VISTARIL) tablet 25 mg  25 mg Oral TID PRN Patrecia Pour, NP   25 mg at 11/19/17 2244  . traZODone  (DESYREL) tablet 150 mg  150 mg Oral QHS PRN Patrecia Pour, NP   150 mg at 11/19/17 2244   PTA Medications: Medications Prior to Admission  Medication Sig Dispense Refill Last Dose  . ABILIFY MAINTENA 400 MG SUSR Inject 400 mg into the muscle every 30 (thirty) days.   0 Past Week at Unknown time  . albuterol (PROVENTIL HFA;VENTOLIN HFA) 108 (90 Base) MCG/ACT inhaler Inhale 2 puffs into the lungs every 6 (six) hours as needed for wheezing or shortness of breath.   11/17/2017 at Unknown time  . ARIPiprazole (ABILIFY) 20 MG tablet Take 1 tablet (20 mg total) by mouth at bedtime. 30 tablet 0 08/15/2017 at Unknown time  . aspirin EC 325 MG tablet Take 325 mg by mouth every 6 (six) hours as needed for mild pain or moderate pain.    Past Week at Unknown time  . benztropine (COGENTIN) 1 MG tablet Take 1 mg by mouth at bedtime.    Past Week at Unknown time  . budesonide-formoterol (SYMBICORT) 160-4.5 MCG/ACT inhaler Inhale 2 puffs into the lungs 2 (two) times daily. For Asthma (Patient not taking: Reported on 11/18/2017) 1 Inhaler 12 Completed Course at Unknown time  . CRANBERRY PO Take 1 tablet by mouth daily.   Past Month at Unknown time  . divalproex (DEPAKOTE ER) 500 MG 24 hr tablet Take 1 tablet (500 mg total) by mouth 2 (two) times daily. (Patient taking differently: Take 1,000 mg by mouth at bedtime. ) 60 tablet 0 2 weeks  . divalproex (DEPAKOTE) 500 MG DR tablet Take 1,000 mg by mouth at bedtime.    11/15/2017 at unknown time  . hydrOXYzine (ATARAX/VISTARIL) 25 MG tablet Take 1 tablet (25 mg total) by mouth 3 (three) times daily as needed for itching or anxiety (sleep). (Patient not taking: Reported on 08/16/2017) 30 tablet 0 Not Taking at Unknown time  . ibuprofen (ADVIL,MOTRIN) 400 MG tablet Take 2 tablets (800 mg total) by mouth 3 (three) times daily. (Patient not taking: Reported on 10/22/2017) 10 tablet 0 Not Taking at Unknown time  . montelukast (SINGULAIR) 10 MG tablet Take 10 mg by mouth at  bedtime.   Past Week at Unknown time  . naproxen (NAPROSYN) 500 MG tablet Take 1 tablet (500 mg total) by mouth 2 (two) times daily. (Patient not taking: Reported on 10/22/2017) 30 tablet 0 Not Taking at Unknown time  . Norethindrone Acetate-Ethinyl Estrad-FE (JUNEL FE 24) 1-20 MG-MCG(24) tablet Take 1 tablet by mouth daily. (Patient not taking: Reported on 10/22/2017) 1 Package 11 Not Taking at Unknown time  . traZODone (DESYREL) 150 MG tablet Take 1 tablet (150 mg total) by mouth at bedtime as needed for sleep. 30 tablet 0 Past Week at Unknown time    Musculoskeletal: Strength & Muscle Tone: within normal limits Gait & Station: normal Patient leans: N/A  Psychiatric Specialty Exam: Physical Exam  Nursing note and vitals reviewed.   Review of Systems  Constitutional: Negative for chills and fever.  Respiratory: Negative for cough and shortness of breath.   Gastrointestinal: Negative for abdominal pain, heartburn, nausea and vomiting.  Psychiatric/Behavioral: Positive for depression. Negative for hallucinations, substance abuse and suicidal ideas. The patient is nervous/anxious. The patient does not have insomnia.     Blood pressure 114/71, pulse (!) 111, temperature 98.2 F (36.8 C), temperature source Oral, resp. rate 18, height 5\' 6"  (1.676 m), weight 100.2 kg (221 lb).Body mass index is 35.67 kg/m.  General Appearance: Casual  Eye Contact:  Good  Speech:  Clear and Coherent and Pressured  Volume:  Normal  Mood:  Anxious and Irritable  Affect:  Labile and Tearful  Thought Process:  Disorganized and Descriptions of Associations: Tangential  Orientation:  Full (Time, Place, and Person)  Thought Content:  Delusions, Hallucinations: Auditory, Ideas of Reference:   Paranoia Delusions, Paranoid Ideation and Tangential  Suicidal Thoughts:  Yes.  with intent/plan  Homicidal Thoughts:  No  Memory:  Immediate;   Fair Recent;   Fair Remote;   Fair  Judgement:  Poor  Insight:  Lacking   Psychomotor Activity:  Normal  Concentration:  Concentration: Poor  Recall:  [employer] of Knowledge:  Fair  Language:  Fair  Akathisia:  No  Handed:    AIMS (if indicated):     Assets:  Communication Skills Resilience Social Support  ADL's:  Intact  Cognition:  WNL  Sleep:  Number of Hours: 4.25    Treatment Plan Summary: Daily contact with patient to assess and evaluate symptoms and progress in treatment and Medication management  Observation Level/Precautions:  15 minute checks  Laboratory:  CBC Chemistry Profile HbAIC HCG UDS UA  Psychotherapy:  Encourage participation in groups and therapeutic milieu   Medications:  Continue currently resumed medications including abilify 20mg  po qhs, depakote DR 500mg  po BID, cogentin 1mg  po BID prn EPS, atarax 25mg  po TID prn anxiety, and trazodone 50mg  po qhs prn insomnia  Consultations:    Discharge Concerns:    Estimated LOS: 3-5 days  Other:     Physician Treatment Plan for Primary Diagnosis: Schizoaffective disorder, bipolar type ([place]) Long Term Goal(s): Improvement in symptoms so as ready for discharge  Short Term Goals: Ability to demonstrate self-control will improve  Physician Treatment Plan for Secondary Diagnosis: Principal Problem:   Schizoaffective disorder, bipolar type ([place])  Long Term Goal(s): Improvement in symptoms so as ready for discharge  Short Term Goals: Ability to identify and develop effective coping behaviors will improve  I certify that inpatient services furnished can reasonably be expected to improve the patient's condition.    Pennelope Bracken, MD 3/26/20193:16 PM

## 2017-11-21 LAB — TSH: TSH: 0.676 u[IU]/mL (ref 0.350–4.500)

## 2017-11-21 LAB — URINALYSIS, ROUTINE W REFLEX MICROSCOPIC
Bilirubin Urine: NEGATIVE
GLUCOSE, UA: NEGATIVE mg/dL
HGB URINE DIPSTICK: NEGATIVE
Ketones, ur: 5 mg/dL — AB
LEUKOCYTES UA: NEGATIVE
Nitrite: NEGATIVE
Protein, ur: NEGATIVE mg/dL
Specific Gravity, Urine: 1.018 (ref 1.005–1.030)
pH: 7 (ref 5.0–8.0)

## 2017-11-21 LAB — LIPID PANEL
CHOL/HDL RATIO: 3.3 ratio
CHOLESTEROL: 180 mg/dL (ref 0–200)
HDL: 54 mg/dL (ref >40)
LDL Cholesterol: 115 mg/dL — ABNORMAL HIGH (ref 0–99)
Triglycerides: 55 mg/dL (ref <150)
VLDL: 11 mg/dL (ref 0–40)

## 2017-11-21 LAB — HEMOGLOBIN A1C
HEMOGLOBIN A1C: 5 % (ref 4.8–5.6)
MEAN PLASMA GLUCOSE: 96.8 mg/dL

## 2017-11-21 MED ORDER — BIOTENE DRY MOUTH MT LIQD
15.0000 mL | OROMUCOSAL | Status: DC | PRN
  Filled 2017-11-21: qty 237

## 2017-11-21 NOTE — Progress Notes (Signed)
Nursing Progress Note: 7p-7a D: Pt currently presents with a tangential/disorganized/intrusive/dominating/disruptive/loose associations/circumstantial affect and behavior. Pt states "I had god times with my dad when he got here. I am worried that I won't get out in time to pay rent. I hope I can get out before the 3rd. If not, I am in big trouble, and I will definitely be homeless." Interacting intrusively/dominating/inappropriately with the milieu. Pt reports good sleep during the previous night with current medication regimen. Pt did attend wrap-up group.  A: Pt provided with medications per providers orders. Pt's labs and vitals were monitored throughout the night. Pt supported emotionally and encouraged to express concerns and questions. Pt educated on medications.  R: Pt's safety ensured with 15 minute and environmental checks. Pt currently denies SI, HI, and AVH. Pt verbally contracts to seek staff if SI,HI, or AVH occurs and to consult with staff before acting on any harmful thoughts. Will continue to monitor.

## 2017-11-21 NOTE — Progress Notes (Signed)
Recreation Therapy Notes  INPATIENT RECREATION THERAPY ASSESSMENT  Patient Details Name: [name] MRN: [id number] DOB: Feb 01, 1971 Today's Date: 11/21/2017       Information Obtained From: Patient  Able to Participate in Assessment/Interview: Yes  Patient Presentation: Responsive, Hyperverbal  Reason for Admission (Per Patient): Patient reports being admitted into the hospital from having a manic episode due to being schizoaffective   Patient Stressor Family, Work  Coping Skills:   Building control surveyor, Prayer, Deep Breathing, Avoidance, Arguments, Aggression, Journal, Write, Read, Talk, Art, Substance Abuse, Music, TV, Dance, Meditate  Leisure Interests (2+):  Eating   Frequency of Recreation/Participation: Weekly  Awareness of Community Resources:  Yes  Community Resources:  [place], Pharmacist, community Theaters  Current Use: No  If no, Barriers?: Museum/gallery curator  Expressed Interest in [place]: No  County of Residence:  [place]   Patient Main Form of Transportation: Diplomatic Services operational officer  Patient Strengths:  Staying calm   Patient Identified Areas of Improvement:  Anger Mangement   Patient Goal for Hospitalization:  "To take care of myself and children"  Current SI (including self-harm):  No  Current HI:  No  Current AVH: No  Staff Intervention Plan: Collaborate with Interdisciplinary Treatment Team, Group Attendance  Consent to Intern Participation: Yes  Ranell Patrick, Recreation Therapy Intern    Ranell Patrick 11/21/2017, 1:21 PM

## 2017-11-21 NOTE — Progress Notes (Signed)
The patient expressed in group that she had a good visit with her father this evening. She then went off on a tangent about her family having a history of mental illness. She mentioned some female relative from years ago and could not discuss her day versus her past history. Her goal for tomorrow is to talk to her father who has the task of finding out more information about her diagnosis.

## 2017-11-21 NOTE — Tx Team (Signed)
Interdisciplinary Treatment and Diagnostic Plan Update  11/21/2017 Time of Session: 12:30 PM  [name] MRN: [id number]  Principal Diagnosis: Schizoaffective disorder, bipolar type [facility])  Secondary Diagnoses: Principal Problem:   Schizoaffective disorder, bipolar type ([place])   Current Medications:  Current Facility-Administered Medications  Medication Dose Route Frequency Provider Last Rate Last Dose  . acetaminophen (TYLENOL) tablet 650 mg  650 mg Oral Q4H PRN Patrecia Pour, NP      . antiseptic oral rinse (BIOTENE) solution 15 mL  15 mL Mouth Rinse PRN Pennelope Bracken, MD      . ARIPiprazole (ABILIFY) tablet 20 mg  20 mg Oral QHS Patrecia Pour, NP   20 mg at 11/20/17 2204  . aspirin EC tablet 325 mg  325 mg Oral Q6H PRN Patrecia Pour, NP      . benztropine (COGENTIN) tablet 1 mg  1 mg Oral BID PRN Pennelope Bracken, MD      . divalproex (DEPAKOTE ER) 24 hr tablet 500 mg  500 mg Oral BID Patrecia Pour, NP   500 mg at 11/21/17 0736  . hydrOXYzine (ATARAX/VISTARIL) tablet 25 mg  25 mg Oral TID PRN Patrecia Pour, NP   25 mg at 11/19/17 2244  . OLANZapine zydis (ZYPREXA) disintegrating tablet 10 mg  10 mg Oral Q8H PRN Pennelope Bracken, MD   10 mg at 11/20/17 2004   And  . ziprasidone (GEODON) injection 20 mg  20 mg Intramuscular PRN Pennelope Bracken, MD      . traZODone (DESYREL) tablet 50 mg  50 mg Oral QHS PRN Pennelope Bracken, MD   50 mg at 11/20/17 2204    PTA Medications: Medications Prior to Admission  Medication Sig Dispense Refill Last Dose  . ABILIFY MAINTENA 400 MG SUSR Inject 400 mg into the muscle every 30 (thirty) days.   0 Past Week at Unknown time  . albuterol (PROVENTIL HFA;VENTOLIN HFA) 108 (90 Base) MCG/ACT inhaler Inhale 2 puffs into the lungs every 6 (six) hours as needed for wheezing or shortness of breath.   11/17/2017 at Unknown time  . ARIPiprazole (ABILIFY) 20 MG tablet Take 1 tablet (20 mg total) by mouth at  bedtime. 30 tablet 0 08/15/2017 at Unknown time  . aspirin EC 325 MG tablet Take 325 mg by mouth every 6 (six) hours as needed for mild pain or moderate pain.    Past Week at Unknown time  . benztropine (COGENTIN) 1 MG tablet Take 1 mg by mouth at bedtime.    Past Week at Unknown time  . budesonide-formoterol (SYMBICORT) 160-4.5 MCG/ACT inhaler Inhale 2 puffs into the lungs 2 (two) times daily. For Asthma (Patient not taking: Reported on 11/18/2017) 1 Inhaler 12 Completed Course at Unknown time  . CRANBERRY PO Take 1 tablet by mouth daily.   Past Month at Unknown time  . divalproex (DEPAKOTE ER) 500 MG 24 hr tablet Take 1 tablet (500 mg total) by mouth 2 (two) times daily. (Patient taking differently: Take 1,000 mg by mouth at bedtime. ) 60 tablet 0 2 weeks  . divalproex (DEPAKOTE) 500 MG DR tablet Take 1,000 mg by mouth at bedtime.    11/15/2017 at unknown time  . hydrOXYzine (ATARAX/VISTARIL) 25 MG tablet Take 1 tablet (25 mg total) by mouth 3 (three) times daily as needed for itching or anxiety (sleep). (Patient not taking: Reported on 08/16/2017) 30 tablet 0 Not Taking at Unknown time  . ibuprofen (ADVIL,MOTRIN) 400 MG tablet  Take 2 tablets (800 mg total) by mouth 3 (three) times daily. (Patient not taking: Reported on 10/22/2017) 10 tablet 0 Not Taking at Unknown time  . montelukast (SINGULAIR) 10 MG tablet Take 10 mg by mouth at bedtime.   Past Week at Unknown time  . naproxen (NAPROSYN) 500 MG tablet Take 1 tablet (500 mg total) by mouth 2 (two) times daily. (Patient not taking: Reported on 10/22/2017) 30 tablet 0 Not Taking at Unknown time  . Norethindrone Acetate-Ethinyl Estrad-FE (JUNEL FE 24) 1-20 MG-MCG(24) tablet Take 1 tablet by mouth daily. (Patient not taking: Reported on 10/22/2017) 1 Package 11 Not Taking at Unknown time  . traZODone (DESYREL) 150 MG tablet Take 1 tablet (150 mg total) by mouth at bedtime as needed for sleep. 30 tablet 0 Past Week at Unknown time    Patient Stressors:     Patient Strengths:    Treatment Modalities: Medication Management, Group therapy, Case management,  1 to 1 session with clinician, Psychoeducation, Recreational therapy.   Physician Treatment Plan for Primary Diagnosis: Schizoaffective disorder, bipolar type ([place]) Long Term Goal(s): Improvement in symptoms so as ready for discharge  Short Term Goals: Ability to demonstrate self-control will improve Ability to identify and develop effective coping behaviors will improve  Medication Management: Evaluate patient's response, side effects, and tolerance of medication regimen.  Therapeutic Interventions: 1 to 1 sessions, Unit Group sessions and Medication administration.  Evaluation of Outcomes: Progressing  Physician Treatment Plan for Secondary Diagnosis: Principal Problem:   Schizoaffective disorder, bipolar type ([place])   Long Term Goal(s): Improvement in symptoms so as ready for discharge  Short Term Goals: Ability to demonstrate self-control will improve Ability to identify and develop effective coping behaviors will improve  Medication Management: Evaluate patient's response, side effects, and tolerance of medication regimen.  Therapeutic Interventions: 1 to 1 sessions, Unit Group sessions and Medication administration.  Evaluation of Outcomes: Progressing   RN Treatment Plan for Primary Diagnosis: Schizoaffective disorder, bipolar type ([place]) Long Term Goal(s): Knowledge of disease and therapeutic regimen to maintain health will improve  Short Term Goals: Ability to demonstrate self-control and Compliance with prescribed medications will improve  Medication Management: RN will administer medications as ordered by provider, will assess and evaluate patient's response and provide education to patient for prescribed medication. RN will report any adverse and/or side effects to prescribing provider.  Therapeutic Interventions: 1 on 1 counseling sessions, Psychoeducation,  Medication administration, Evaluate responses to treatment, Monitor vital signs and CBGs as ordered, Perform/monitor CIWA, COWS, AIMS and Fall Risk screenings as ordered, Perform wound care treatments as ordered.  Evaluation of Outcomes: Adequate for Discharge   LCSW Treatment Plan for Primary Diagnosis: Schizoaffective disorder, bipolar type ([place]) Long Term Goal(s): Safe transition to appropriate next level of care at discharge, Engage patient in therapeutic group addressing interpersonal concerns.  Short Term Goals: Engage patient in aftercare planning with referrals and resources  Therapeutic Interventions: Assess for all discharge needs, 1 to 1 time with Social worker, Explore available resources and support systems, Assess for adequacy in community support network, Educate family and significant other(s) on suicide prevention, Complete Psychosocial Assessment, Interpersonal group therapy.  Evaluation of Outcomes: Met  Return home, follow up [facility]   Progress in Treatment: Attending groups: Yes Participating in groups: Yes Taking medication as prescribed: Yes Toleration medication: Yes, no side effects reported at this time Family/Significant other contact made: Yes Patient understands diagnosis: No  Limited insight Discussing patient identified problems/goals with staff:  Yes Medical problems stabilized or resolved: Yes Denies suicidal/homicidal ideation: Yes Issues/concerns per patient self-inventory: None Other: N/A  New problem(s) identified: None identified at this time.   New Short Term/Long Term Goal(s): "My goal is to get back to work again, or be able to volunteer somewhere."  Discharge Plan or Barriers:   Reason for Continuation of Hospitalization:  [place]  Medication stabilization   Estimated Length of Stay: 11/26/17  Attendees: Patient: [name] 11/21/2017  12:30 PM  Physician: Maris Berger, MD 11/21/2017  12:30 PM  Nursing:  Elesa Massed, RN 11/21/2017  12:30 PM  RN Care Manager: Lars Pinks, RN 11/21/2017  12:30 PM  Social Worker: Ripley Fraise 11/21/2017  12:30 PM  Recreational Therapist: Winfield Cunas 11/21/2017  12:30 PM  Other: Norberto Sorenson 11/21/2017  12:30 PM  Other:  11/21/2017  12:30 PM    Scribe for Treatment Team:  Roque Lias LCSW 11/21/2017 12:30 PM

## 2017-11-21 NOTE — Progress Notes (Signed)
Patient ID: [name], female   DOB: [birth year], 47 y.o.   MRN: [id number]  DAR Note: Pt observing in dayroom interacting with peers. Speech continue to be very loud, tangential and pressured. Pt endorsed moderate anxiety and depression. Denied SI/HI, AVH or pain. Was med compliant, attended wrap-up group. Will continue to monitor for safety.

## 2017-11-21 NOTE — Progress Notes (Signed)
Recreation Therapy Notes  Date: 3.27.19 Time: 10:00 p.m. Location: [address] Dayroom   Group Topic: Self-Esteem   Goal Area(s) Addresses:  Goal 1.1: To increase self-esteem  - Group will improve mood through participation during Recreation Therapy tx.  - Group will identify at least one positive affirmation by the end of therapy session.   - Group will identify the importance of self-esteem    Behavioral Response: Appropriate   Intervention: Art   Activity: Patients were to think of a positive affirmation phrase or word to write on their poster. Patents were then given the opportunity to design their poster using the colored pencils and markers provided. Afterwards, Patients shared their positive affirmation to the group and explained how it can help improve their self-esteem.   Education: Self-Esteem   Education Outcome: Acknowledges Education  Clinical Observations/Feedback: Patient attended and participated appropriately during Recreation Therapy group treatment. Patient was able to share poster with peers, successfully identifying one positive affirmation (Self-Respect). Patient was able to explain how this affirmation can increase their self-esteem. Patient successfully met Goal 1.1 (see above).   Ranell Patrick, Recreation Therapy Intern  Ranell Patrick 11/21/2017 10:52 AM

## 2017-11-21 NOTE — [facility] Suicide Risk Assessment (Signed)
[facility] INPATIENT:  Family/Significant Other Suicide Prevention Education  Suicide Prevention Education:  Education Completed; Kaliann Coryell, mother, [phone]  has been identified by the patient as the family member/significant other with whom the patient will be residing, and identified as the person(s) who will aid the patient in the event of a mental health crisis (suicidal ideations/suicide attempt).  With written consent from the patient, the family member/significant other has been provided the following suicide prevention education, prior to the and/or following the discharge of the patient.  The suicide prevention education provided includes the following:  Suicide risk factors  Suicide prevention and interventions  National Suicide Hotline telephone number  [facility] assessment telephone number  [facility] Emergency Assistance [place] and/or Residential Mobile Crisis Unit telephone number  Request made of family/significant other to:  Remove weapons (e.g., guns, rifles, knives), all items previously/currently identified as safety concern.    Remove drugs/medications (over-the-counter, prescriptions, illicit drugs), all items previously/currently identified as a safety concern.  The family member/significant other verbalizes understanding of the suicide prevention education information provided.  The family member/significant other agrees to remove the items of safety concern listed above. Stated that pt has been doing well for quite awhile, but needed to take a break from [employer] for awhile because of increased stress.  Decided to go back early rather than waiting for the entire agreed upon time, and it became too much.  Also, a couple weeks ago [place] convinced [employer] that she could be her own payee, and now mother is worried that she will not pay her bills first. Trish Mage 11/21/2017, 2:53 PM

## 2017-11-21 NOTE — Progress Notes (Signed)
[facility] MD Progress Note  11/21/2017 12:49 PM [name]  MRN:  [id number] Subjective:    [name] is a 47 y/o F with history of schizoaffective disorder, bipolar type who was admitted voluntarily from [facility] ED with worsening symptoms of agitation, depression, disorganization, SI with plan to stab self, HI towards her co-workers, and poor adherence to her home regimen of depakote. Pt had called [employer] with concern that she was having worsening symptoms and she cited stressor of conflict with her co-workers at [employer]. Pt was restarted on previous home medications of abilify and depakote after transfer to [facility]. Upon presentation to [facility], pt remained pressured, labile, tangential, and disorganized.  Today upon evaluation, pt is pressured and tangential. Despite being given specific, concrete questions she responds tangentially. She denies any specific complaints initially, stating, "I'm doing fine, sir." She reports that she had some early awakening today but otherwise she slept well. Her only physical complaint is some dry mouth, and she was in agreement to attempt trial of biotene. She denies SI/HI/AH/VH. Her appetite is good. She notes she is tolerating her medications well. She redirects the conversation multiple times to discussing conflict with her family and her plans to obtain a different job after discharge, and she is difficult to redirect. She is in agreement to continue her current treatment regimen without changes.   Principal Problem: Schizoaffective disorder, bipolar type ([place]) Diagnosis:   Patient Active Problem List   Diagnosis Date Noted  . Noncompliance with treatment [Z91.19] 07/16/2015  . Schizoaffective disorder, bipolar type ([place]) [F25.0] 01/11/2015   Total Time spent with patient: 30 minutes  Past Psychiatric History: see H&P  Past Medical History:  Past Medical History:  Diagnosis Date  . Anemia   . Anxiety   . Asthma   . Bipolar 1 disorder ([place])   . Breast  cancer ([place])   . Depression   . Diabetes mellitus without complication ([place])   . Hypertension   . Insomnia, persistent   . Schizophrenic disorder ([place])   . Seizures ([place])     Past Surgical History:  Procedure Laterality Date  . BREAST SURGERY Left   . TONSILLECTOMY     Family History:  Family History  Problem Relation Age of Onset  . Depression Mother   . Gout Mother   . Cancer Father        prostate  . Other Father        lung issue  . Alcoholism Other   . Heart attack Paternal Grandfather   . Heart attack Paternal Grandmother   . Heart attack Maternal Grandmother   . Heart attack Maternal Grandfather   . Depression Son   . Anxiety disorder Son    Family Psychiatric  History: see H&P Social History:  Social History   Substance and Sexual Activity  Alcohol Use No     Social History   Substance and Sexual Activity  Drug Use No    Social History   Socioeconomic History  . Marital status: Single    Spouse name: Not on file  . Number of children: Not on file  . Years of education: Not on file  . Highest education level: Not on file  Occupational History  . Not on file  Social Needs  . Financial resource strain: Not on file  . Food insecurity:    Worry: Not on file    Inability: Not on file  . Transportation needs:    Medical: Not on file  Non-medical: Not on file  Tobacco Use  . Smoking status: Former Smoker    Types: Cigarettes  . Smokeless tobacco: Never Used  Substance and Sexual Activity  . Alcohol use: No  . Drug use: No  . Sexual activity: Never    Birth control/protection: Pill  Lifestyle  . Physical activity:    Days per week: Not on file    Minutes per session: Not on file  . Stress: Not on file  Relationships  . Social connections:    Talks on phone: Not on file    Gets together: Not on file    Attends religious service: Not on file    Active member of club or organization: Not on file    Attends meetings of clubs or  organizations: Not on file    Relationship status: Not on file  Other Topics Concern  . Not on file  Social History Narrative  . Not on file   Additional Social History:                         Sleep: Fair  Appetite:  Good  Current Medications: Current Facility-Administered Medications  Medication Dose Route Frequency Provider Last Rate Last Dose  . acetaminophen (TYLENOL) tablet 650 mg  650 mg Oral Q4H PRN Patrecia Pour, NP      . antiseptic oral rinse (BIOTENE) solution 15 mL  15 mL Mouth Rinse PRN Pennelope Bracken, MD      . ARIPiprazole (ABILIFY) tablet 20 mg  20 mg Oral QHS Patrecia Pour, NP   20 mg at 11/20/17 2204  . aspirin EC tablet 325 mg  325 mg Oral Q6H PRN Patrecia Pour, NP      . benztropine (COGENTIN) tablet 1 mg  1 mg Oral BID PRN Pennelope Bracken, MD      . divalproex (DEPAKOTE ER) 24 hr tablet 500 mg  500 mg Oral BID Patrecia Pour, NP   500 mg at 11/21/17 0736  . hydrOXYzine (ATARAX/VISTARIL) tablet 25 mg  25 mg Oral TID PRN Patrecia Pour, NP   25 mg at 11/19/17 2244  . OLANZapine zydis (ZYPREXA) disintegrating tablet 10 mg  10 mg Oral Q8H PRN Pennelope Bracken, MD   10 mg at 11/20/17 2004   And  . ziprasidone (GEODON) injection 20 mg  20 mg Intramuscular PRN Pennelope Bracken, MD      . traZODone (DESYREL) tablet 50 mg  50 mg Oral QHS PRN Pennelope Bracken, MD   50 mg at 11/20/17 2204    Lab Results:  Results for orders placed or performed during the hospital encounter of 11/19/17 (from the past 48 hour(s))  Urinalysis, Routine w reflex microscopic     Status: Abnormal   Collection Time: 11/20/17  4:26 PM  Result Value Ref Range   Color, Urine YELLOW YELLOW   APPearance CLEAR CLEAR   Specific Gravity, Urine 1.018 1.005 - 1.030   pH 7.0 5.0 - 8.0   Glucose, UA NEGATIVE NEGATIVE mg/dL   Hgb urine dipstick NEGATIVE NEGATIVE   Bilirubin Urine NEGATIVE NEGATIVE   Ketones, ur 5 (A) NEGATIVE mg/dL    Protein, ur NEGATIVE NEGATIVE mg/dL   Nitrite NEGATIVE NEGATIVE   Leukocytes, UA NEGATIVE NEGATIVE    Comment: Performed at [facility], [place] [address], [place], [place] [address]  Lipid panel     Status: Abnormal   Collection Time: 11/21/17  6:42 AM  Result Value Ref Range   Cholesterol 180 0 - 200 mg/dL   Triglycerides 55 <150 mg/dL   HDL 54 >40 mg/dL   Total CHOL/HDL Ratio 3.3 RATIO   VLDL 11 0 - 40 mg/dL   LDL Cholesterol 115 (H) 0 - 99 mg/dL    Comment:        Total Cholesterol/HDL:CHD Risk Coronary Heart Disease Risk Table                     Men   Women  1/2 Average Risk   3.4   3.3  Average Risk       5.0   4.4  2 X Average Risk   9.6   7.1  3 X Average Risk  23.4   11.0        Use the calculated Patient Ratio above and the CHD Risk Table to determine the patient's CHD Risk.        ATP III CLASSIFICATION (LDL):  <100     mg/dL   Optimal  100-129  mg/dL   Near or Above                    Optimal  130-159  mg/dL   Borderline  160-189  mg/dL   High  >190     mg/dL   Very High Performed at [facility] [address] [place], [place] [address]   Hemoglobin A1c     Status: None   Collection Time: 11/21/17  6:42 AM  Result Value Ref Range   Hgb A1c MFr Bld 5.0 4.8 - 5.6 %    Comment: (NOTE) Pre diabetes:          5.7%-6.4% Diabetes:              >6.4% Glycemic control for   <7.0% adults with diabetes    Mean Plasma Glucose 96.8 mg/dL    Comment: Performed at [facility] [address], [place], [place] [address]  TSH     Status: None   Collection Time: 11/21/17  6:42 AM  Result Value Ref Range   TSH 0.676 0.350 - 4.500 uIU/mL    Comment: Performed by a 3rd Generation assay with a functional sensitivity of <=0.01 uIU/mL. Performed at [facility], [place] [address], [place],  [address]     Blood Alcohol level:  Lab Results  Component Value Date   ETH <10 11/18/2017   ETH <10 52/77/8242     Metabolic Disorder Labs: Lab Results  Component Value Date   HGBA1C 5.0 11/21/2017   MPG 96.8 11/21/2017   MPG 123 01/15/2015   No results found for: PROLACTIN Lab Results  Component Value Date   CHOL 180 11/21/2017   TRIG 55 11/21/2017   HDL 54 11/21/2017   CHOLHDL 3.3 11/21/2017   VLDL 11 11/21/2017   LDLCALC 115 (H) 11/21/2017   LDLCALC 111 (H) 01/15/2015    Physical Findings: AIMS:  , ,  ,  ,    CIWA:    COWS:     Musculoskeletal: Strength & Muscle Tone: within normal limits Gait & Station: normal Patient leans: N/A  Psychiatric Specialty Exam: Physical Exam  Nursing note and vitals reviewed.   Review of Systems  Constitutional: Negative for chills and fever.  Respiratory: Negative for cough and shortness of breath.   Cardiovascular: Negative for chest pain.  Gastrointestinal: Negative for abdominal pain, heartburn, nausea  and vomiting.  Psychiatric/Behavioral: Negative for depression, hallucinations and suicidal ideas. The patient is not nervous/anxious and does not have insomnia.     Blood pressure 140/85, pulse 92, temperature 98.2 F (36.8 C), resp. rate 18, height 5\' 6"  (1.676 m), weight 100.2 kg (221 lb).Body mass index is 35.67 kg/m.  General Appearance: Casual and Fairly Groomed  Eye Contact:  Good  Speech:  Clear and Coherent and Pressured  Volume:  Normal  Mood:  Euthymic  Affect:  Blunt and Labile  Thought Process:  Disorganized and Descriptions of Associations: Tangential  Orientation:  Full (Time, Place, and Person)  Thought Content:  Tangential  Suicidal Thoughts:  No  Homicidal Thoughts:  No  Memory:  Immediate;   Fair Recent;   Fair Remote;   Fair  Judgement:  Poor  Insight:  Lacking  Psychomotor Activity:  Normal  Concentration:  Concentration: Poor  Recall:  [employer] of Knowledge:  Fair  Language:  Fair  Akathisia:  No  Handed:    AIMS (if indicated):     Assets:  Resilience Social Support  ADL's:  Intact  Cognition:   WNL  Sleep:  Number of Hours: 6.5   Treatment Plan Summary: Daily contact with patient to assess and evaluate symptoms and progress in treatment and Medication management   -Continue inpatient hospitalization  -Schizoaffective disorder, bipolar type   - Continue abilify 20mg  po qhs    - Continue abilify maintena 400mg  IM q30 days (last given ~3/20 as per pt, we will verify with outpatient provider)   - Continue depakote ER 500mg  po BID   - Draw valproic acid level on 11/24/17  -EPS   - Continue cogentin 1mg  po BID prn EPS  - Anxiety    - Continue atarax 25mg  po TID prn anxiety  -Agitation   - Continue agitation protocol with zydis/ativan/geodon  - Insomnia   - Continue trazodone 50mg  po qhs prn insomnia  - Dry mouth   - Start biotene 27mL swish and spit PRN dry mouth  -Encourage participation in groups and therapeutic milieu  -Disposition planning will be ongoing  Pennelope Bracken, MD 11/21/2017, 12:49 PM

## 2017-11-21 NOTE — [facility] Group Notes (Signed)
LCSW Group Therapy Note   11/21/2017 1:15pm   Type of Therapy and Topic:  Group Therapy:  Trust and Honesty  Participation Level:  Active -[place]  Description of Group:    In this group patients will be asked to explore the value of being honest.  Patients will be guided to discuss their thoughts, feelings, and behaviors related to honesty and trusting in others. Patients will process together how trust and honesty relate to forming relationships with peers, family members, and self. Each patient will be challenged to identify and express feelings of being vulnerable. Patients will discuss reasons why people are dishonest and identify alternative outcomes if one was truthful (to self or others). This group will be process-oriented, with patients participating in exploration of their own experiences, giving and receiving support, and processing challenge from other group members.   Therapeutic Goals: 1. Patient will identify why honesty is important to relationships and how honesty overall affects relationships.  2. Patient will identify a situation where they lied or were lied too and the  feelings, thought process, and behaviors surrounding the situation 3. Patient will identify the meaning of being vulnerable, how that feels, and how that correlates to being honest with self and others. 4. Patient will identify situations where they could have told the truth, but instead lied and explain reasons of dishonesty.   Summary of Patient Progress  "Honesty is truthfullness.  My father myleah cavendish is a very truthful man." This was the most coherent statement she made.  Pressured, tangential, difficult to redirect.  "I wasn't done with my thought yet!"  Stayed the entire time.  Therapeutic Modalities:   Cognitive Behavioral Therapy Solution Focused Therapy Motivational Interviewing Brief Therapy  Trish Mage, LCSW 11/21/2017 1:31 PM

## 2017-11-22 LAB — PROLACTIN: PROLACTIN: 5.4 ng/mL (ref 4.8–23.3)

## 2017-11-22 MED ORDER — INFLUENZA VAC SPLIT QUAD 0.5 ML IM SUSY
0.5000 mL | PREFILLED_SYRINGE | INTRAMUSCULAR | Status: AC
  Administered 2017-11-23: 0.5 mL via INTRAMUSCULAR
  Filled 2017-11-22: qty 0.5

## 2017-11-22 MED ORDER — ALBUTEROL SULFATE HFA 108 (90 BASE) MCG/ACT IN AERS
2.0000 | INHALATION_SPRAY | RESPIRATORY_TRACT | Status: DC | PRN
  Filled 2017-11-22: qty 6.7

## 2017-11-22 MED ORDER — INFLUENZA VAC SPLIT HIGH-DOSE 0.5 ML IM SUSY
0.5000 mL | PREFILLED_SYRINGE | INTRAMUSCULAR | Status: DC
  Filled 2017-11-22: qty 0.5

## 2017-11-22 NOTE — Progress Notes (Signed)
Patient ID: [name], female   DOB: 06-Sep-1970, 47 y.o.   MRN: [id number]   D: Patient is visible in the milieu and is interacting with staff and peers. Pt has a bright affect, and an uplifting mood, and denies SI/HI/AVH.  Pt has disorganized thought processes and flight of ideas, and rambles on and on with ideas that are irrelevant to whatever question that is asked.  Pt reports that her sleep quality last night was good, reports a good appetite, describes a normal energy level, and reports that her concentration level is good today.  Pt rates her depression level today as 1 (10 being the highest level), rates her hopelessness level as  0 (10 being the highest level), and rates her anxiety level as 9 (10 being the highest level).  Pt denies being in any physical pain, and denies having any other concerns.  A: Patient educated on her medications, verbalized understanding, and all meds given as ordered.  Pt being maintained on Q15 minute safety checks.   R: Pt denies any current concerns, and does not seem to be in any apparent distress.  Will maintain on Q15 minute safety checks and will continue to monitor.

## 2017-11-22 NOTE — Progress Notes (Signed)
Adult Psychoeducational Group Note  Date:  11/22/2017 Time:  9:06 PM  Group Topic/Focus:  Wrap-Up Group:   The focus of this group is to help patients review their daily goal of treatment and discuss progress on daily workbooks.  Participation Level:  Active  Participation Quality:  Appropriate  Affect:  Appropriate  Cognitive:  Alert  Insight: Appropriate  Engagement in Group:  Engaged  Modes of Intervention:  Discussion  Additional Comments:  Patient stated having a good day. [name] goal for today was to socialize, cooperate, listen, and to participate in group.   Jarrick Fjeld L Bryce Cheever 11/22/2017, 9:06 PM

## 2017-11-22 NOTE — Progress Notes (Signed)
[facility] MD Progress Note  11/22/2017 4:02 PM [name]  MRN:  [id number] Subjective:    [name] is a 47 y/o F with history of schizoaffective disorder, bipolar type who was admitted voluntarily from [facility] ED with worsening symptoms of agitation, depression, disorganization, SI with plan to stab self, HI towards her co-workers, and poor adherence to her home regimen of depakote. Pt had called [employer] with concern that she was having worsening symptoms and she cited stressor of conflict with her co-workers at [employer]. Pt was restarted on previous home medications of abilify and depakote after transfer to [facility]. Upon presentation to [facility], pt remained pressured, labile, tangential, and disorganized.  Today upon evaluation, pt remains somewhat tangential and pressured, but these symptoms are reduced compared to encounter yesterday. She states, "Things are going alright." She has no specific concerns today. She denies physical complaints. She denies SI/HI. She reports that she thought she saw "the devil" standing in her doorway, but she is unsure if it could have been another patient. She otherwise denies AH/VH. She is tolerating her medications well, and she notes that abilify is helpful for her. We discussed that continuing on the oral form of abilify in addition to maintena at this time is recommended to help supplement the long-acting injectable form. Discussed with patient that we will obtain a depakote level in 2 days and adjust her dose accordingly, and pt was in agreement. She had no further questions, comments, or concerns.  Principal Problem: Schizoaffective disorder, bipolar type ([place]) Diagnosis:   Patient Active Problem List   Diagnosis Date Noted  . Noncompliance with treatment [Z91.19] 07/16/2015  . Schizoaffective disorder, bipolar type ([place]) [F25.0] 01/11/2015   Total Time spent with patient: 30 minutes  Past Psychiatric History: see H&P  Past Medical History:  Past Medical  History:  Diagnosis Date  . Anemia   . Anxiety   . Asthma   . Bipolar 1 disorder ([place])   . Breast cancer ([place])   . Depression   . Diabetes mellitus without complication ([place])   . Hypertension   . Insomnia, persistent   . Schizophrenic disorder ([place])   . Seizures ([place])     Past Surgical History:  Procedure Laterality Date  . BREAST SURGERY Left   . TONSILLECTOMY     Family History:  Family History  Problem Relation Age of Onset  . Depression Mother   . Gout Mother   . Cancer Father        prostate  . Other Father        lung issue  . Alcoholism Other   . Heart attack Paternal Grandfather   . Heart attack Paternal Grandmother   . Heart attack Maternal Grandmother   . Heart attack Maternal Grandfather   . Depression Son   . Anxiety disorder Son    Family Psychiatric  History: see H&P Social History:  Social History   Substance and Sexual Activity  Alcohol Use No     Social History   Substance and Sexual Activity  Drug Use No    Social History   Socioeconomic History  . Marital status: Single    Spouse name: Not on file  . Number of children: Not on file  . Years of education: Not on file  . Highest education level: Not on file  Occupational History  . Not on file  Social Needs  . Financial resource strain: Not on file  . Food insecurity:    Worry: Not on  file    Inability: Not on file  . Transportation needs:    Medical: Not on file    Non-medical: Not on file  Tobacco Use  . Smoking status: Former Smoker    Types: Cigarettes  . Smokeless tobacco: Never Used  Substance and Sexual Activity  . Alcohol use: No  . Drug use: No  . Sexual activity: Never    Birth control/protection: Pill  Lifestyle  . Physical activity:    Days per week: Not on file    Minutes per session: Not on file  . Stress: Not on file  Relationships  . Social connections:    Talks on phone: Not on file    Gets together: Not on file    Attends religious service: Not on  file    Active member of club or organization: Not on file    Attends meetings of clubs or organizations: Not on file    Relationship status: Not on file  Other Topics Concern  . Not on file  Social History Narrative  . Not on file   Additional Social History:                         Sleep: Good  Appetite:  Good  Current Medications: Current Facility-Administered Medications  Medication Dose Route Frequency Provider Last Rate Last Dose  . acetaminophen (TYLENOL) tablet 650 mg  650 mg Oral Q4H PRN Patrecia Pour, NP      . albuterol (PROVENTIL HFA;VENTOLIN HFA) 108 (90 Base) MCG/ACT inhaler 2 puff  2 puff Inhalation Q4H PRN Pennelope Bracken, MD      . antiseptic oral rinse (BIOTENE) solution 15 mL  15 mL Mouth Rinse PRN Pennelope Bracken, MD      . ARIPiprazole (ABILIFY) tablet 20 mg  20 mg Oral QHS Patrecia Pour, NP   20 mg at 11/21/17 2253  . aspirin EC tablet 325 mg  325 mg Oral Q6H PRN Patrecia Pour, NP      . benztropine (COGENTIN) tablet 1 mg  1 mg Oral BID PRN Pennelope Bracken, MD      . divalproex (DEPAKOTE ER) 24 hr tablet 500 mg  500 mg Oral BID Patrecia Pour, NP   500 mg at 11/22/17 0736  . hydrOXYzine (ATARAX/VISTARIL) tablet 25 mg  25 mg Oral TID PRN Patrecia Pour, NP   25 mg at 11/19/17 2244  . [START ON 11/23/2017] Influenza vac split quadrivalent PF (FLUARIX) injection 0.5 mL  0.5 mL Intramuscular Tomorrow-1000 Maris Berger T, MD      . OLANZapine zydis (ZYPREXA) disintegrating tablet 10 mg  10 mg Oral Q8H PRN Pennelope Bracken, MD   10 mg at 11/21/17 2253   And  . ziprasidone (GEODON) injection 20 mg  20 mg Intramuscular PRN Pennelope Bracken, MD      . traZODone (DESYREL) tablet 50 mg  50 mg Oral QHS PRN Pennelope Bracken, MD   50 mg at 11/21/17 2253    Lab Results:  Results for orders placed or performed during the hospital encounter of 11/19/17 (from the past 48 hour(s))  Urinalysis, Routine  w reflex microscopic     Status: Abnormal   Collection Time: 11/20/17  4:26 PM  Result Value Ref Range   Color, Urine YELLOW YELLOW   APPearance CLEAR CLEAR   Specific Gravity, Urine 1.018 1.005 - 1.030   pH 7.0 5.0 - 8.0  Glucose, UA NEGATIVE NEGATIVE mg/dL   Hgb urine dipstick NEGATIVE NEGATIVE   Bilirubin Urine NEGATIVE NEGATIVE   Ketones, ur 5 (A) NEGATIVE mg/dL   Protein, ur NEGATIVE NEGATIVE mg/dL   Nitrite NEGATIVE NEGATIVE   Leukocytes, UA NEGATIVE NEGATIVE    Comment: Performed at [facility], [place] [address], [place], [place] [address]  Lipid panel     Status: Abnormal   Collection Time: 11/21/17  6:42 AM  Result Value Ref Range   Cholesterol 180 0 - 200 mg/dL   Triglycerides 55 <150 mg/dL   HDL 54 >40 mg/dL   Total CHOL/HDL Ratio 3.3 RATIO   VLDL 11 0 - 40 mg/dL   LDL Cholesterol 115 (H) 0 - 99 mg/dL    Comment:        Total Cholesterol/HDL:CHD Risk Coronary Heart Disease Risk Table                     Men   Women  1/2 Average Risk   3.4   3.3  Average Risk       5.0   4.4  2 X Average Risk   9.6   7.1  3 X Average Risk  23.4   11.0        Use the calculated Patient Ratio above and the CHD Risk Table to determine the patient's CHD Risk.        ATP III CLASSIFICATION (LDL):  <100     mg/dL   Optimal  100-129  mg/dL   Near or Above                    Optimal  130-159  mg/dL   Borderline  160-189  mg/dL   High  >190     mg/dL   Very High Performed at [facility] [address], [place], [place] [address]   Prolactin     Status: None   Collection Time: 11/21/17  6:42 AM  Result Value Ref Range   Prolactin 5.4 4.8 - 23.3 ng/mL    Comment: (NOTE) Performed At: [facility] [place], [place] [id number] Rush Farmer MD [id number] Performed at [facility], [place] [address], [place], [place] [address]   Hemoglobin A1c     Status: None   Collection Time: 11/21/17  6:42 AM   Result Value Ref Range   Hgb A1c MFr Bld 5.0 4.8 - 5.6 %    Comment: (NOTE) Pre diabetes:          5.7%-6.4% Diabetes:              >6.4% Glycemic control for   <7.0% adults with diabetes    Mean Plasma Glucose 96.8 mg/dL    Comment: Performed at [facility] [address] [place], [place] [address]  TSH     Status: None   Collection Time: 11/21/17  6:42 AM  Result Value Ref Range   TSH 0.676 0.350 - 4.500 uIU/mL    Comment: Performed by a 3rd Generation assay with a functional sensitivity of <=0.01 uIU/mL. Performed at [facility], [place] [address] [place], [place] [address]     Blood Alcohol level:  Lab Results  Component Value Date   [facility] <10 11/18/2017   ETH <10 79/09/4095    Metabolic Disorder Labs: Lab Results  Component Value Date   HGBA1C 5.0 11/21/2017   MPG 96.8 11/21/2017   MPG 123  01/15/2015   Lab Results  Component Value Date   PROLACTIN 5.4 11/21/2017   Lab Results  Component Value Date   CHOL 180 11/21/2017   TRIG 55 11/21/2017   HDL 54 11/21/2017   CHOLHDL 3.3 11/21/2017   VLDL 11 11/21/2017   LDLCALC 115 (H) 11/21/2017   LDLCALC 111 (H) 01/15/2015    Physical Findings: AIMS:  , ,  ,  ,    CIWA:    COWS:     Musculoskeletal: Strength & Muscle Tone: within normal limits Gait & Station: normal Patient leans: N/A  Psychiatric Specialty Exam: Physical Exam  Nursing note and vitals reviewed.   Review of Systems  Constitutional: Negative for chills and fever.  Respiratory: Negative for cough and shortness of breath.   Cardiovascular: Negative for chest pain.  Gastrointestinal: Negative for abdominal pain, heartburn, nausea and vomiting.  Psychiatric/Behavioral: Negative for depression, hallucinations and suicidal ideas. The patient is not nervous/anxious.     Blood pressure 122/70, pulse 92, temperature 97.9 F (36.6 C), resp. rate 18, height 5\' 6"  (1.676 m), weight 100.2 kg (221 lb).Body mass index is 35.67  kg/m.  General Appearance: Casual and Fairly Groomed  Eye Contact:  Good  Speech:  Clear and Coherent and Normal Rate  Volume:  Normal  Mood:  Euthymic  Affect:  Blunt  Thought Process:  Coherent and Descriptions of Associations: Tangential  Orientation:  Full (Time, Place, and Person)  Thought Content:  Tangential  Suicidal Thoughts:  No  Homicidal Thoughts:  No  Memory:  Immediate;   Fair Recent;   Fair Remote;   Fair  Judgement:  Poor  Insight:  Lacking  Psychomotor Activity:  Normal  Concentration:  Concentration: Fair  Recall:  [employer] of Knowledge:  Fair  Language:  Fair  Akathisia:  No  Handed:    AIMS (if indicated):     Assets:  Resilience Social Support  ADL's:  Intact  Cognition:  WNL  Sleep:  Number of Hours: 4.75   Treatment Plan Summary: Daily contact with patient to assess and evaluate symptoms and progress in treatment and Medication management   -Continue inpatient hospitalization  -Schizoaffective disorder, bipolar type             - Continue abilify 20mg  po qhs             - Continue abilify maintena 400mg  IM q30 days (last given ~3/20 as per pt, we will verify with outpatient provider)             - Continue depakote ER 500mg  po BID             - Draw valproic acid level on 11/24/17  -EPS             - Continue cogentin 1mg  po BID prn EPS  - Anxiety             - Continue atarax 25mg  po TID prn anxiety  -Agitation             - Continue agitation protocol with zydis/ativan/geodon  - Insomnia              - Continue trazodone 50mg  po qhs prn insomnia  - Dry mouth             - Continue biotene 28mL swish and spit PRN dry mouth  -Encourage participation in groups and therapeutic milieu  -Disposition planning will be ongoing   Pennelope Bracken, MD 11/22/2017,  4:02 PM

## 2017-11-22 NOTE — Progress Notes (Signed)
Nursing Progress Note: 7p-7a D: Pt currently presents with a animated/anxious/tangential/disorganized affect and behavior. Pt states "My goal today was to be more straightforward and relaxed. I realize I have been having intimacy issues. I should probably leave all that at home." Interacting more appropriately/less dominating with the milieu. Pt reports good sleep during the previous night with current medication regimen. Pt did attend wrap-up group.  A: Pt provided with medications per providers orders. Pt's labs and vitals were monitored throughout the night. Pt supported emotionally and encouraged to express concerns and questions. Pt educated on medications.  R: Pt's safety ensured with 15 minute and environmental checks. Pt currently denies SI, HI, and AVH. Pt verbally contracts to seek staff if SI,HI, or AVH occurs and to consult with staff before acting on any harmful thoughts. Will continue to monitor.

## 2017-11-22 NOTE — Progress Notes (Signed)
Recreation Therapy Notes  Date: 3.28.19 Time: 10:00 a.m. Location: [address] Dayroom  Group Topic: Wellness   Goal Area(s) Addresses:  To promote physical health  -Patient will participate in recreation Therapy group tx. -Patient will identify the importance of physical  -Patient will instruct at least two dances/ exercise moves   Behavioral Response: Appropriate, Engaged   Intervention: Exercise   Activity: Zumba: Patients contributed at least two freestyle dances/exercise moves towards group activity to increase physical activity    Education: Wellness   Education Outcome: Acknowledges Education  Clinical Observations/Feedback: Patient attended and participated appropriately during Recreation Therapy group treatment successfully contributing at least two dances/ exercise moves to group activity. Patient communicated with peers in an appropriate manner. Patient was able to identify the importance of physical health. Patient successfully met Goal 1.1 (see above).    [name], Recreation Therapy Intern   [name] 11/22/2017 9:42 AM 

## 2017-11-22 NOTE — [facility] Group Notes (Signed)
LCSW Group Therapy 11/22/2017 1:15pm  Type of Therapy and Topic:  Group Therapy:  Change and Accountability  Participation Level:  Active  Description of Group In this group, patients discussed power and accountability for change.  The group identified the challenges related to accountability and the difficulty of accepting the outcomes of negative behaviors.  Patients were encouraged to openly discuss a challenge/change they could take responsibility for.  Patients discussed the use of "change talk" and positive thinking as ways to support achievement of personal goals.  The group discussed ways to give support and empowerment to peers.  Therapeutic Goals: 1. Patients will state the relationship between personal power and accountability in the change process 2. Patients will identify the positive and negative consequences of a personal choice they have made 3. Patients will identify one challenge/choice they will take responsibility for making 4. Patients will discuss the role of "change talk" and the impact of positive thinking as it supports successful personal change 5. Patients will verbalize support and affirmation of change efforts in peers  Summary of Patient Progress:  [name] attended group and remained there the entire time.  She interrupted the speaker on several occasions and the speaker redirected her behavior.  When asked the question what would you do if money was no object, she answered that she would not shackle people with mental illness and put them in jail or cages like animals.  Her behavior in group today was similar to her behavior in group the day before.  She continues to be disorganized and appears to have psychosis.   Therapeutic Modalities Solution Focused Brief Therapy Motivational Interviewing Cognitive Behavioral Therapy  Riley Lam Work 11/22/2017 11:12 AM

## 2017-11-23 NOTE — B[facility] Group Notes (Signed)
[place] LCSW Group Therapy  11/23/2017  1:05 PM  Type of Therapy:  Group therapy  Participation Level:  Active  Participation Quality:  Attentive  Affect:  Flat  Cognitive:  Oriented  Insight:  Limited  Engagement in Therapy:  Limited  Modes of Intervention:  Discussion, Socialization  Summary of Progress/Problems:  Chaplain was here to lead a group on themes of hope and courage. "Hope is living independently.  Hope is my faith.  Hope is my belief.  I go to church. That's where fellowship is.  Except with some people who accused me of being opportunistic. You know, OPC."  Rambling. Interrupting.  Unaware of social cues. Blurting out whenever something came to her mind.  Pressured speech.  TangentialRoque Lias B 11/23/2017 1:22 PM

## 2017-11-23 NOTE — Progress Notes (Signed)
Adult Psychoeducational Group Note  Date:  11/23/2017 Time:  8:36 PM  Group Topic/Focus:  Wrap-Up Group:   The focus of this group is to help patients review their daily goal of treatment and discuss progress on daily workbooks.  Participation Level:  Active  Participation Quality:  Appropriate  Affect:  Appropriate  Cognitive:  Appropriate  Insight: Appropriate  Engagement in Group:  Engaged  Modes of Intervention:  Discussion  Additional Comments:  The patient expressed that she rates today a 9.The patient also said that she enjoy recreation group.  Nash Shearer 11/23/2017, 8:36 PM

## 2017-11-23 NOTE — Progress Notes (Signed)
[facility] MD Progress Note  11/23/2017 12:04 PM [name]  MRN:  [id number] Subjective:    [name] is a 47 y/o F with history of schizoaffective disorder, bipolar type who was admitted voluntarily from [name] ED with worsening symptoms of agitation, depression, disorganization, SI with plan to stab self, HI towards her co-workers, and poor adherence to her home regimen of depakote. Pt had called [employer] with concern that she was having worsening symptoms and she cited stressor of conflict with her co-workers at [employer]. Pt had received abilify Maintena in the days prior to admission, and she was restarted on previous home medication of depakote (she reported having been off of this medication for at least 3-4 days prior to admission) as well as oral abilifyafter transfer to [facility]. Upon presentation to [facility], pt remained pressured, labile, tangential, and disorganized. She has been demonstrating incremental improvement of her presenting symptoms, and she has been able to participate in groups and the therapeutic milieu.  Today upon evaluation, pt shares, "Things are going well." She is mildly pressured but able to be interrupted and redirected during interview. Her mood is not labile today. She continues to have tangential thought process. She asks about "the continuum" and "equilibrium" associated with "manic depression," and discussed with patient how our approach is to prescribe medications which help stabilize her mood. Pt remains tangential, talking about her feet, then stating "I had a taste of cancer," then discussing exposure to asbestos in her relatives, then discussing her mother's diagnosis of diabetes, and then mentioning metformin. Pt denies SI/HI/AH/VH. She notes she is sleeping well. Her appetite is good. She denies physical complaints. We discussed plan to obtain depakote level tomorrow AM with plan to adjust her dose of depakote accordingly, and pt was in agreement. She had no further  questions, comments, or concerns.   Principal Problem: Schizoaffective disorder, bipolar type ([place]) Diagnosis:   Patient Active Problem List   Diagnosis Date Noted  . Noncompliance with treatment [Z91.19] 07/16/2015  . Schizoaffective disorder, bipolar type ([place]) [F25.0] 01/11/2015   Total Time spent with patient: 30 minutes  Past Psychiatric History: see H&P  Past Medical History:  Past Medical History:  Diagnosis Date  . Anemia   . Anxiety   . Asthma   . Bipolar 1 disorder ([place])   . Breast cancer ([place])   . Depression   . Diabetes mellitus without complication ([place])   . Hypertension   . Insomnia, persistent   . Schizophrenic disorder ([place])   . Seizures ([place])     Past Surgical History:  Procedure Laterality Date  . BREAST SURGERY Left   . TONSILLECTOMY     Family History:  Family History  Problem Relation Age of Onset  . Depression Mother   . Gout Mother   . Cancer Father        prostate  . Other Father        lung issue  . Alcoholism Other   . Heart attack Paternal Grandfather   . Heart attack Paternal Grandmother   . Heart attack Maternal Grandmother   . Heart attack Maternal Grandfather   . Depression Son   . Anxiety disorder Son    Family Psychiatric  History: see H&P Social History:  Social History   Substance and Sexual Activity  Alcohol Use No     Social History   Substance and Sexual Activity  Drug Use No    Social History   Socioeconomic History  . Marital status: Single  Spouse name: Not on file  . Number of children: Not on file  . Years of education: Not on file  . Highest education level: Not on file  Occupational History  . Not on file  Social Needs  . Financial resource strain: Not on file  . Food insecurity:    Worry: Not on file    Inability: Not on file  . Transportation needs:    Medical: Not on file    Non-medical: Not on file  Tobacco Use  . Smoking status: Former Smoker    Types: Cigarettes  . Smokeless  tobacco: Never Used  Substance and Sexual Activity  . Alcohol use: No  . Drug use: No  . Sexual activity: Never    Birth control/protection: Pill  Lifestyle  . Physical activity:    Days per week: Not on file    Minutes per session: Not on file  . Stress: Not on file  Relationships  . Social connections:    Talks on phone: Not on file    Gets together: Not on file    Attends religious service: Not on file    Active member of club or organization: Not on file    Attends meetings of clubs or organizations: Not on file    Relationship status: Not on file  Other Topics Concern  . Not on file  Social History Narrative  . Not on file   Additional Social History:                         Sleep: Good  Appetite:  Good  Current Medications: Current Facility-Administered Medications  Medication Dose Route Frequency Provider Last Rate Last Dose  . acetaminophen (TYLENOL) tablet 650 mg  650 mg Oral Q4H PRN Patrecia Pour, NP      . albuterol (PROVENTIL HFA;VENTOLIN HFA) 108 (90 Base) MCG/ACT inhaler 2 puff  2 puff Inhalation Q4H PRN Pennelope Bracken, MD      . antiseptic oral rinse (BIOTENE) solution 15 mL  15 mL Mouth Rinse PRN Pennelope Bracken, MD      . ARIPiprazole (ABILIFY) tablet 20 mg  20 mg Oral QHS Patrecia Pour, NP   20 mg at 11/22/17 2211  . aspirin EC tablet 325 mg  325 mg Oral Q6H PRN Patrecia Pour, NP      . benztropine (COGENTIN) tablet 1 mg  1 mg Oral BID PRN Pennelope Bracken, MD      . divalproex (DEPAKOTE ER) 24 hr tablet 500 mg  500 mg Oral BID Patrecia Pour, NP   500 mg at 11/23/17 1610  . hydrOXYzine (ATARAX/VISTARIL) tablet 25 mg  25 mg Oral TID PRN Patrecia Pour, NP   25 mg at 11/19/17 2244  . OLANZapine zydis (ZYPREXA) disintegrating tablet 10 mg  10 mg Oral Q8H PRN Pennelope Bracken, MD   10 mg at 11/22/17 2212   And  . ziprasidone (GEODON) injection 20 mg  20 mg Intramuscular PRN Pennelope Bracken, MD       . traZODone (DESYREL) tablet 50 mg  50 mg Oral QHS PRN Pennelope Bracken, MD   50 mg at 11/22/17 2211    Lab Results: No results found for this or any previous visit (from the past 21 hour(s)).  Blood Alcohol level:  Lab Results  Component Value Date   ETH <10 11/18/2017   ETH <10 96/11/5407    Metabolic Disorder Labs:  Lab Results  Component Value Date   HGBA1C 5.0 11/21/2017   MPG 96.8 11/21/2017   MPG 123 01/15/2015   Lab Results  Component Value Date   PROLACTIN 5.4 11/21/2017   Lab Results  Component Value Date   CHOL 180 11/21/2017   TRIG 55 11/21/2017   HDL 54 11/21/2017   CHOLHDL 3.3 11/21/2017   VLDL 11 11/21/2017   LDLCALC 115 (H) 11/21/2017   LDLCALC 111 (H) 01/15/2015    Physical Findings: AIMS:  , ,  ,  ,    CIWA:    COWS:     Musculoskeletal: Strength & Muscle Tone: within normal limits Gait & Station: normal Patient leans: N/A  Psychiatric Specialty Exam: Physical Exam  Nursing note and vitals reviewed.   Review of Systems  Constitutional: Negative for chills and fever.  Respiratory: Negative for cough and shortness of breath.   Cardiovascular: Negative for chest pain.  Gastrointestinal: Negative for abdominal pain, heartburn, nausea and vomiting.  Psychiatric/Behavioral: Negative for depression, hallucinations and suicidal ideas. The patient is not nervous/anxious and does not have insomnia.     Blood pressure 131/86, pulse 95, temperature 98.2 F (36.8 C), temperature source Oral, resp. rate 16, height 5\' 6"  (1.676 m), weight 100.2 kg (221 lb).Body mass index is 35.67 kg/m.  General Appearance: Casual and Fairly Groomed  Eye Contact:  Good  Speech:  Clear and Coherent and Pressured  Volume:  Normal  Mood:  Euthymic  Affect:  Blunt and Congruent  Thought Process:  Disorganized and Descriptions of Associations: Tangential  Orientation:  Full (Time, Place, and Person)  Thought Content:  Tangential  Suicidal Thoughts:  No   Homicidal Thoughts:  No  Memory:  Immediate;   Fair Recent;   Fair Remote;   Fair  Judgement:  Fair  Insight:  Lacking  Psychomotor Activity:  Normal  Concentration:  Concentration: Fair  Recall:  [employer] of Knowledge:  Fair  Language:  Fair  Akathisia:  No  Handed:    AIMS (if indicated):     Assets:  Communication Skills Resilience Social Support  ADL's:  Intact  Cognition:  WNL  Sleep:  Number of Hours: 5.75    Treatment Plan Summary: Daily contact with patient to assess and evaluate symptoms and progress in treatment and Medication management   -Continue inpatient hospitalization  -Schizoaffective disorder, bipolar type - Continue abilify 20mg  po qhs - Continue abilify maintena 400mg  IM q30 days (last given ~3/20 as per pt, we will verify with outpatient provider) - Continue depakote ER 500mg  po BID - Draw valproic acid level on 11/24/17 AM  -EPS - Continue cogentin 1mg  po BID prn EPS  - Anxiety - Continue atarax 25mg  po TID prn anxiety  -Agitation - Continue agitation protocol with zydis/ativan/geodon  - Insomnia - Continue trazodone 50mg  po qhs prn insomnia  - Dry mouth - Continue biotene 78mL swish and spit PRN dry mouth  -Encourage participation in groups and therapeutic milieu  -Disposition planning will be ongoing   Pennelope Bracken, MD 11/23/2017, 12:05 PM

## 2017-11-23 NOTE — Progress Notes (Signed)
DAR NOTE: Patient presents with animated affect and anxious mood.  Denies suicidal thoughts, pain, auditory and visual hallucinations.  Described energy level as normal and concentration as good.  Rates depression at 0, hopelessness at 0, and anxiety at 0.  Maintained on routine safety checks.  Medications given as prescribed.  Support and encouragement offered as needed.  Attended group and participated.  States goal for today is "prepare for discharge."  Patient observed socializing with peers in the dayroom.  Offered no complaint.

## 2017-11-23 NOTE — Progress Notes (Signed)
Recreation Therapy Notes  Date: 11/23/17 Time: 1000 Location: 500 Hall Dayroom  Group Topic: Leisure Education  Goal Area(s) Addresses:  Patient will identify positive leisure activities.  Patient will identify one positive benefit of participation in leisure activities.   Behavioral Response: Engaged  Intervention: [employer], marker, Web designer, names of leisure activities  Activity: Leisure Freight forwarder.  Each patient was had to pick a leisure activity from the container and draw it on the board.  The remaining group members were to guess what was being drawn.  The person that guessed the picture would get the next turn.  Education:  Leisure Education, Dentist  Education Outcome: Acknowledges education/In group clarification offered/Needs additional education  Clinical Observations/Feedback: Pt arrived late.  Pt was active and engaged during group.  Pt did have some outbursts and mentioned about being bi-polar.  Pt also made some sexual comments but was able to correct herself.   Victorino Sparrow, LRT/CTRS         Ria Comment, Beulah Capobianco A 11/23/2017 10:55 AM

## 2017-11-24 DIAGNOSIS — R451 Restlessness and agitation: Secondary | ICD-10-CM

## 2017-11-24 DIAGNOSIS — G4700 Insomnia, unspecified: Secondary | ICD-10-CM

## 2017-11-24 DIAGNOSIS — Z87891 Personal history of nicotine dependence: Secondary | ICD-10-CM

## 2017-11-24 LAB — VALPROIC ACID LEVEL: Valproic Acid Lvl: 73 ug/mL (ref 50.0–100.0)

## 2017-11-24 NOTE — Progress Notes (Signed)
Patient ID: [name], female   DOB: Aug 19, 1971, 47 y.o.   MRN: [id number]    D: Pt has been very disorganized today on the unit. Pt did attend all groups, but was very tangential. Pt reported that her depression was a 0, her hopelessness was a 0, and her anxiety was a 5. Pt reported that her goal was to listen and be helpful. Pt took all medication without any problems. Pt reported being negative SI/HI, no AH/VH noted. A: 15 min checks continued for patient safety. R: Pt safety maintained.

## 2017-11-24 NOTE — Progress Notes (Signed)
D: Pt continues to be disorganized. Pt is observed throughout the beginning of the shift talking to herself. Writer found it difficult to follow the pt in conversation. When asked about her day pt stated, "alright, alright". Then pt began talking about different singers, and other news. Pt has no questions or concerns.    A:  Support and encouragement was offered. 15 min checks continued for safety.  R: Pt remains safe.

## 2017-11-24 NOTE — [facility] Group Notes (Signed)
  [facility] LCSW Group Therapy Note  Date/Time:  11/24/2017 11:15AM-12:00PM  Type of Therapy and Topic:  Group Therapy:  Feelings About Hospitalization  Participation Level:  Active   Description of Group This process group involved patients discussing their feelings related to being hospitalized, as well as the benefits they see to being in the hospital.  These feelings and benefits were itemized.  The group then brainstormed specific ways in which they could seek those same benefits when they discharge and return home.  Therapeutic Goals 1. Patient will identify and describe positive and negative feelings related to hospitalization 2. Patient will verbalize benefits of hospitalization to themselves personally 3. Patients will brainstorm together ways they can obtain similar benefits in the outpatient setting, identify barriers to wellness and possible solutions  Summary of Patient Progress:  The patient expressed her primary feelings about being hospitalized are "chained like an animal" and "hopeful" and "it's a stigma" and "not accepted."  She was monopolizing and tangential, although she showed evidence at times of recognizing this fact.  Therapeutic Modalities Cognitive Behavioral Therapy Motivational Interviewing    Selmer Dominion, LCSW 11/24/2017, 1:25 PM

## 2017-11-24 NOTE — Progress Notes (Signed)
Writer has observed patient up in the dayroom alone and talking to herself while looking at tv. Writer spoke with her 1:1 and informed her of scheduled medication at hs. She began talking about how her medication either has her high or low and she needs something to keep her in the middle. She has pressured, disorganized and tangential speech and conversation. She reports that she need to put in an application at [place]. Support given and safety maintained on unit with 15 min checks.

## 2017-11-24 NOTE — [facility] Group Notes (Signed)
[place] Group Notes:  (Nursing/MHT/Case Management/Adjunct)  Date:  11/24/2017  Time:  9:31 AM  Type of Therapy:  Psychoeducational Skills  Participation Level:  Minimal  Participation Quality:  Resistant  Affect:  Resistant  Cognitive:  Disorganized  Insight:  Lacking  Engagement in Group:  Lacking  Modes of Intervention:  Problem-solving  Summary of Progress/Problems: Pt attended Psychoeducational group with topic anger management.     [name] 11/24/2017, 9:31 AM

## 2017-11-24 NOTE — Progress Notes (Signed)
Adult Psychoeducational Group Note  Date:  11/24/2017 Time:  8:56 PM  Group Topic/Focus:  Wrap-Up Group:   The focus of this group is to help patients review their daily goal of treatment and discuss progress on daily workbooks.  Participation Level:  Active  Participation Quality:  Appropriate  Affect:  Appropriate  Cognitive:  Appropriate  Insight: Appropriate  Engagement in Group:  Engaged  Modes of Intervention:  Discussion  Additional Comments: The patient expressed that she rates today a 10.The patient also said that she attended all groups.  Nash Shearer 11/24/2017, 8:56 PM

## 2017-11-24 NOTE — [facility] Group Notes (Signed)
[place] Group Notes:  (Nursing/MHT/Case Management/Adjunct)  Date:  11/24/2017  Time:  9:08 AM  Type of Therapy:  Patient self inventory group and goals group  Participation Level:  Active  Participation Quality:  Inattentive  Affect:  Anxious and Resistant  Cognitive:  Disorganized  Insight:  Lacking  Engagement in Group:  Lacking  Modes of Intervention:  Education  Summary of Progress/Problems: Pt did attend patient self inventory group and goals group.  [name] 11/24/2017, 9:08 AM

## 2017-11-24 NOTE — Progress Notes (Signed)
[facility] MD Progress Note  11/24/2017 3:53 PM [name]  MRN:  [id number] Subjective:  [name] reports" I am doing and feeling great"  [name]  is awake and alert and oriented.  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 patient's speech is pressured and tangential, however patient is redirectable.  Continues to ruminate about  working at [employer] and a  altercation she had with a coworkers.Reports she has concerns about going back to work at [employer]. patient presents hyperverbalslightly agitated and irritable at times during this conversation. Aneita is very labile seen responding to internal stimuli while pacing the unit.  Reports she is taking her medications as prescribed and tolerating them well.  Labs reviewed Depakote level therapeutic at 73 on 11/24/2017.   History:per assessment noteTonia [name] is a 47 y/o F with history of schizoaffective disorder, bipolar type who was admitted voluntarily from [facility] ED with worsening symptoms of agitation, depression, disorganization, SI with plan to stab self, HI towards her co-workers, and poor adherence to her home regimen of depakote. Pt had called [employer] with concern that she was having worsening symptoms and she cited stressor of conflict with her co-workers at [employer]. Pt had received abilify Maintena in the days prior to admission, and she was restarted on previous home medication of depakote (she reported having been off of this medication for at least 3-4 days prior to admission) as well as oral abilifyafter transfer to [facility]. Upon presentation to [facility], pt remained pressured, labile, tangential, and disorganized. She has been demonstrating incremental improvement of her presenting symptoms, and she has been able to participate in groups and the therapeutic milieu.     Principal Problem: Schizoaffective disorder, bipolar type ([place]) Diagnosis:   Patient Active Problem List   Diagnosis Date Noted  . Noncompliance with  treatment [Z91.19] 07/16/2015  . Schizoaffective disorder, bipolar type ([place]) [F25.0] 01/11/2015   Total Time spent with patient: 30 minutes  Past Psychiatric History: see H&P  Past Medical History:  Past Medical History:  Diagnosis Date  . Anemia   . Anxiety   . Asthma   . Bipolar 1 disorder ([place])   . Breast cancer ([place])   . Depression   . Diabetes mellitus without complication ([place])   . Hypertension   . Insomnia, persistent   . Schizophrenic disorder ([place])   . Seizures ([place])     Past Surgical History:  Procedure Laterality Date  . BREAST SURGERY Left   . TONSILLECTOMY     Family History:  Family History  Problem Relation Age of Onset  . Depression Mother   . Gout Mother   . Cancer Father        prostate  . Other Father        lung issue  . Alcoholism Other   . Heart attack Paternal Grandfather   . Heart attack Paternal Grandmother   . Heart attack Maternal Grandmother   . Heart attack Maternal Grandfather   . Depression Son   . Anxiety disorder Son    Family Psychiatric  History: see H&P Social History:  Social History   Substance and Sexual Activity  Alcohol Use No     Social History   Substance and Sexual Activity  Drug Use No    Social History   Socioeconomic History  . Marital status: Single    Spouse name: Not on file  . Number of children: Not on file  . Years of education: Not on file  . Highest  education level: Not on file  Occupational History  . Not on file  Social Needs  . Financial resource strain: Not on file  . Food insecurity:    Worry: Not on file    Inability: Not on file  . Transportation needs:    Medical: Not on file    Non-medical: Not on file  Tobacco Use  . Smoking status: Former Smoker    Types: Cigarettes  . Smokeless tobacco: Never Used  Substance and Sexual Activity  . Alcohol use: No  . Drug use: No  . Sexual activity: Never    Birth control/protection: Pill  Lifestyle  . Physical activity:    Days per  week: Not on file    Minutes per session: Not on file  . Stress: Not on file  Relationships  . Social connections:    Talks on phone: Not on file    Gets together: Not on file    Attends religious service: Not on file    Active member of club or organization: Not on file    Attends meetings of clubs or organizations: Not on file    Relationship status: Not on file  Other Topics Concern  . Not on file  Social History Narrative  . Not on file   Additional Social History:                         Sleep: Good  Appetite:  Good  Current Medications: Current Facility-Administered Medications  Medication Dose Route Frequency Provider Last Rate Last Dose  . acetaminophen (TYLENOL) tablet 650 mg  650 mg Oral Q4H PRN Patrecia Pour, NP      . albuterol (PROVENTIL HFA;VENTOLIN HFA) 108 (90 Base) MCG/ACT inhaler 2 puff  2 puff Inhalation Q4H PRN Pennelope Bracken, MD      . antiseptic oral rinse (BIOTENE) solution 15 mL  15 mL Mouth Rinse PRN Pennelope Bracken, MD      . ARIPiprazole (ABILIFY) tablet 20 mg  20 mg Oral QHS Patrecia Pour, NP   20 mg at 11/23/17 2115  . aspirin EC tablet 325 mg  325 mg Oral Q6H PRN Patrecia Pour, NP      . benztropine (COGENTIN) tablet 1 mg  1 mg Oral BID PRN Pennelope Bracken, MD      . divalproex (DEPAKOTE ER) 24 hr tablet 500 mg  500 mg Oral BID Patrecia Pour, NP   500 mg at 11/24/17 0754  . hydrOXYzine (ATARAX/VISTARIL) tablet 25 mg  25 mg Oral TID PRN Patrecia Pour, NP   25 mg at 11/24/17 0331  . OLANZapine zydis (ZYPREXA) disintegrating tablet 10 mg  10 mg Oral Q8H PRN Pennelope Bracken, MD   10 mg at 11/22/17 2212   And  . ziprasidone (GEODON) injection 20 mg  20 mg Intramuscular PRN Pennelope Bracken, MD      . traZODone (DESYREL) tablet 50 mg  50 mg Oral QHS PRN Pennelope Bracken, MD   50 mg at 11/23/17 2116    Lab Results:  Results for orders placed or performed during the hospital  encounter of 11/19/17 (from the past 48 hour(s))  Valproic acid level     Status: None   Collection Time: 11/24/17  6:19 AM  Result Value Ref Range   Valproic Acid Lvl 73 50.0 - 100.0 ug/mL    Comment: Performed at [facility], [address]  [place], [place] [address]    Blood Alcohol level:  Lab Results  Component Value Date   ETH <10 11/18/2017   ETH <10 86/57/8469    Metabolic Disorder Labs: Lab Results  Component Value Date   HGBA1C 5.0 11/21/2017   MPG 96.8 11/21/2017   MPG 123 01/15/2015   Lab Results  Component Value Date   PROLACTIN 5.4 11/21/2017   Lab Results  Component Value Date   CHOL 180 11/21/2017   TRIG 55 11/21/2017   HDL 54 11/21/2017   CHOLHDL 3.3 11/21/2017   VLDL 11 11/21/2017   LDLCALC 115 (H) 11/21/2017   LDLCALC 111 (H) 01/15/2015    Physical Findings: AIMS:  , ,  ,  ,    CIWA:    COWS:     Musculoskeletal: Strength & Muscle Tone: within normal limits Gait & Station: normal Patient leans: N/A  Psychiatric Specialty Exam: Physical Exam  Nursing note and vitals reviewed. Constitutional: She is oriented to person, place, and time. She appears well-developed.  Cardiovascular: Normal rate.  Neurological: She is alert and oriented to person, place, and time.  Skin: Skin is warm and dry.  Psychiatric: She has a normal mood and affect.    Review of Systems  Constitutional: Negative for chills and fever.  Respiratory: Negative for cough and shortness of breath.   Cardiovascular: Negative for chest pain.  Gastrointestinal: Negative for abdominal pain, heartburn, nausea and vomiting.  Psychiatric/Behavioral: Negative for depression, hallucinations and suicidal ideas. The patient is not nervous/anxious and does not have insomnia.     Blood pressure 113/87, pulse 93, temperature 98.3 F (36.8 C), temperature source Oral, resp. rate 16, height 5\' 6"  (1.676 m), weight 100.2 kg (221 lb).Body mass index is 35.67 kg/m.   General Appearance: Casual and Fairly Groomed  Eye Contact:  Good  Speech:  Clear and Coherent and Pressured  Volume:  Normal  Mood:  Euthymic  Affect:  Blunt and Congruent  Thought Process:  Disorganized and Descriptions of Associations: Tangential  Orientation:  Full (Time, Place, and Person)  Thought Content:  Tangential  Suicidal Thoughts:  No  Homicidal Thoughts:  No  Memory:  Immediate;   Fair Recent;   Fair Remote;   Fair  Judgement:  Fair  Insight:  Lacking  Psychomotor Activity:  Normal  Concentration:  Concentration: Fair  Recall:  [employer] of Knowledge:  Fair  Language:  Fair  Akathisia:  No  Handed:    AIMS (if indicated):     Assets:  Communication Skills Resilience Social Support  ADL's:  Intact  Cognition:  WNL  Sleep:  Number of Hours: 4    Treatment Plan Summary: Daily contact with patient to assess and evaluate symptoms and progress in treatment and Medication management   -Continue current treatment plan on 11/24/2017 except where noted  -Schizoaffective disorder, bipolar type - Continue abilify 20mg  po qhs - Continue abilify maintena 400mg  IM q30 days (last given ~3/20 as per pt, we will verify with outpatient provider) - Continue depakote ER 500mg  po BID - Draw valproic acid level reviewed 73ug/mL  -EPS - Continue cogentin 1mg  po BID prn EPS  - Anxiety - Continue atarax 25mg  po TID prn anxiety  -Agitation - Continue agitation protocol with zydis/ativan/geodon  - Insomnia - Continue trazodone 50mg  po qhs prn insomnia  - Dry mouth - Continue biotene 32mL swish and spit PRN dry mouth  -Encourage participation in groups and therapeutic milieu -Disposition planning will be ongoing  Derrill Center, NP 11/24/2017, 3:53 PM

## 2017-11-25 MED ORDER — PRAZOSIN HCL 1 MG PO CAPS
1.0000 mg | ORAL_CAPSULE | Freq: Every day | ORAL | Status: DC
  Administered 2017-11-25 – 2017-11-26 (×2): 1 mg via ORAL
  Filled 2017-11-25 (×4): qty 1

## 2017-11-25 MED ORDER — TRAZODONE HCL 100 MG PO TABS
100.0000 mg | ORAL_TABLET | Freq: Every day | ORAL | Status: DC
  Administered 2017-11-25 – 2017-11-26 (×2): 100 mg via ORAL
  Filled 2017-11-25 (×4): qty 1

## 2017-11-25 MED ORDER — LORAZEPAM 1 MG PO TABS
1.0000 mg | ORAL_TABLET | Freq: Four times a day (QID) | ORAL | Status: DC | PRN
  Administered 2017-11-27: 1 mg via ORAL
  Filled 2017-11-25: qty 1

## 2017-11-25 NOTE — BHH Group Notes (Signed)
[facility] Group Notes:  (Nursing/MHT/Case Management/Adjunct)  Date:  11/25/2017  Time:  4:07 PM  Type of Therapy:  Psychoeducational Skills  Participation Level:  Minimal  Participation Quality:  Resistant  Affect:  Resistant  Cognitive:  Disorganized  Insight:  Lacking  Engagement in Group:  Lacking  Modes of Intervention:  Problem-solving  Summary of Progress/Problems: Pt attended Psychoeducational group with topic anger management.     [name] 11/25/2017, 4:07 PM

## 2017-11-25 NOTE — BHH Group Notes (Signed)
[facility] Group Notes:  (Nursing/MHT/Case Management/Adjunct)  Date:  11/25/2017  Time:  9:36 AM  Type of Therapy:  Patient self inventory group and goals group  Participation Level:  Active  Participation Quality:  Inattentive  Affect:  Anxious and Resistant  Cognitive:  Disorganized  Insight:  Lacking  Engagement in Group:  Lacking  Modes of Intervention:  Education  Summary of Progress/Problems: Pt did attend patient self inventory group and goals group.  [name] 11/25/2017, 9:36 AM

## 2017-11-25 NOTE — Progress Notes (Addendum)
[facility] Progress Note  11/25/2017 8:38 AM [name]  MRN:  [id number]   Evaluation:  [name]  is awake and alert and oriented.continues to present with pressured speech and is hyperverbal but pleasant. Reports she rested well last night, however has been experiencing nightmares. Per nursing staff patient was reported to have slept for 2 hours. Discussed the need initiating medication to aid with symptoms patient is agreeable to plan.  Patient is medication compliant. Denying any medication side effects. . Reports she is waiting  for music therapy to start.  Patient continues to not to deny suicidal or  homicidal ideation.  Denies thoughts of self-harm or paranoia ideation.  Denies auditory or visual hallucinations however observed responding to internal stimuli.  Reports a fair appetite.  Support and encouragement and reassurance was provided.   History:per assessment noteTonia [name] is a 47 y/o F with history of schizoaffective disorder, bipolar type who was admitted voluntarily from [facility] ED with worsening symptoms of agitation, depression, disorganization, SI with plan to stab self, HI towards her co-workers, and poor adherence to her home regimen of depakote. Pt had called [employer] with concern that she was having worsening symptoms and she cited stressor of conflict with her co-workers at [employer]. Pt had received abilify Maintena in the days prior to admission, and she was restarted on previous home medication of depakote (she reported having been off of this medication for at least 3-4 days prior to admission) as well as oral abilifyafter transfer to [facility]. Upon presentation to [facility], pt remained pressured, labile, tangential, and disorganized. She has been demonstrating incremental improvement of her presenting symptoms, and she has been able to participate in groups and the therapeutic milieu.     Principal Problem: Schizoaffective disorder, bipolar type ([place]) Diagnosis:   Patient Active  Problem List   Diagnosis Date Noted  . Noncompliance with treatment [Z91.19] 07/16/2015  . Schizoaffective disorder, bipolar type ([place]) [F25.0] 01/11/2015   Total Time spent with patient: 30 minutes  Past Psychiatric History: see H&P  Past Medical History:  Past Medical History:  Diagnosis Date  . Anemia   . Anxiety   . Asthma   . Bipolar 1 disorder ([place])   . Breast cancer ([place])   . Depression   . Diabetes mellitus without complication ([place])   . Hypertension   . Insomnia, persistent   . Schizophrenic disorder ([place])   . Seizures ([place])     Past Surgical History:  Procedure Laterality Date  . BREAST SURGERY Left   . TONSILLECTOMY     Family History:  Family History  Problem Relation Age of Onset  . Depression Mother   . Gout Mother   . Cancer Father        prostate  . Other Father        lung issue  . Alcoholism Other   . Heart attack Paternal Grandfather   . Heart attack Paternal Grandmother   . Heart attack Maternal Grandmother   . Heart attack Maternal Grandfather   . Depression Son   . Anxiety disorder Son    Family Psychiatric  History: see H&P Social History:  Social History   Substance and Sexual Activity  Alcohol Use No     Social History   Substance and Sexual Activity  Drug Use No    Social History   Socioeconomic History  . Marital status: Single    Spouse name: Not on file  . Number of children: Not on file  .  Years of education: Not on file  . Highest education level: Not on file  Occupational History  . Not on file  Social Needs  . Financial resource strain: Not on file  . Food insecurity:    Worry: Not on file    Inability: Not on file  . Transportation needs:    Medical: Not on file    Non-medical: Not on file  Tobacco Use  . Smoking status: Former Smoker    Types: Cigarettes  . Smokeless tobacco: Never Used  Substance and Sexual Activity  . Alcohol use: No  . Drug use: No  . Sexual activity: Never    Birth  control/protection: Pill  Lifestyle  . Physical activity:    Days per week: Not on file    Minutes per session: Not on file  . Stress: Not on file  Relationships  . Social connections:    Talks on phone: Not on file    Gets together: Not on file    Attends religious service: Not on file    Active member of club or organization: Not on file    Attends meetings of clubs or organizations: Not on file    Relationship status: Not on file  Other Topics Concern  . Not on file  Social History Narrative  . Not on file   Additional Social History:                         Sleep: Good  Appetite:  Good  Current Medications: Current Facility-Administered Medications  Medication Dose Route Frequency Provider Last Rate Last Dose  . acetaminophen (TYLENOL) tablet 650 mg  650 mg Oral Q4H PRN Patrecia Pour, NP      . albuterol (PROVENTIL HFA;VENTOLIN HFA) 108 (90 Base) MCG/ACT inhaler 2 puff  2 puff Inhalation Q4H PRN Pennelope Bracken, MD      . antiseptic oral rinse (BIOTENE) solution 15 mL  15 mL Mouth Rinse PRN Pennelope Bracken, MD      . ARIPiprazole (ABILIFY) tablet 20 mg  20 mg Oral QHS Patrecia Pour, NP   20 mg at 11/24/17 2105  . aspirin EC tablet 325 mg  325 mg Oral Q6H PRN Patrecia Pour, NP      . benztropine (COGENTIN) tablet 1 mg  1 mg Oral BID PRN Pennelope Bracken, MD      . divalproex (DEPAKOTE ER) 24 hr tablet 500 mg  500 mg Oral BID Patrecia Pour, NP   500 mg at 11/25/17 0736  . hydrOXYzine (ATARAX/VISTARIL) tablet 25 mg  25 mg Oral TID PRN Patrecia Pour, NP   25 mg at 11/24/17 2105  . OLANZapine zydis (ZYPREXA) disintegrating tablet 10 mg  10 mg Oral Q8H PRN Pennelope Bracken, MD   10 mg at 11/22/17 2212   And  . ziprasidone (GEODON) injection 20 mg  20 mg Intramuscular PRN Pennelope Bracken, MD      . traZODone (DESYREL) tablet 50 mg  50 mg Oral QHS PRN Pennelope Bracken, MD   50 mg at 11/24/17 2105    Lab  Results:  Results for orders placed or performed during the hospital encounter of 11/19/17 (from the past 48 hour(s))  Valproic acid level     Status: None   Collection Time: 11/24/17  6:19 AM  Result Value Ref Range   Valproic Acid Lvl 73 50.0 - 100.0 ug/mL    Comment: Performed  at [facility], [place] [address], [place], [place] [address]    Blood Alcohol level:  Lab Results  Component Value Date   ETH <10 11/18/2017   ETH <10 87/56/4332    Metabolic Disorder Labs: Lab Results  Component Value Date   HGBA1C 5.0 11/21/2017   MPG 96.8 11/21/2017   MPG 123 01/15/2015   Lab Results  Component Value Date   PROLACTIN 5.4 11/21/2017   Lab Results  Component Value Date   CHOL 180 11/21/2017   TRIG 55 11/21/2017   HDL 54 11/21/2017   CHOLHDL 3.3 11/21/2017   VLDL 11 11/21/2017   LDLCALC 115 (H) 11/21/2017   LDLCALC 111 (H) 01/15/2015    Physical Findings: AIMS:  , ,  ,  ,    CIWA:    COWS:     Musculoskeletal: Strength & Muscle Tone: within normal limits Gait & Station: normal Patient leans: N/A  Psychiatric Specialty Exam: Physical Exam  Nursing note and vitals reviewed. Constitutional: She is oriented to person, place, and time. She appears well-developed.  Cardiovascular: Normal rate.  Neurological: She is alert and oriented to person, place, and time.  Skin: Skin is warm and dry.  Psychiatric: She has a normal mood and affect.    Review of Systems  Psychiatric/Behavioral: Negative for depression, hallucinations and suicidal ideas. The patient has insomnia (charted 2 hours on 3/30). The patient is not nervous/anxious.   All other systems reviewed and are negative.   Blood pressure 115/84, pulse 90, temperature 98.7 F (37.1 C), temperature source Oral, resp. rate 20, height 5\' 6"  (1.676 m), weight 100.2 kg (221 lb).Body mass index is 35.67 kg/m.  General Appearance: Casual and Fairly Groomed  Eye Contact:  Good  Speech:  Clear and  Coherent and Pressured  Volume:  Normal  Mood:  Euthymic  Affect:  Blunt and Congruent  Thought Process:  Disorganized and Descriptions of Associations: Tangential  Orientation:  Full (Time, Place, and Person)  Thought Content:  Tangential  Suicidal Thoughts:  No  Homicidal Thoughts:  No  Memory:  Immediate;   Fair Recent;   Fair Remote;   Fair  Judgement:  Fair  Insight:  Lacking  Psychomotor Activity:  Normal  Concentration:  Concentration: Fair  Recall:  [employer] of Knowledge:  Fair  Language:  Fair  Akathisia:  No  Handed:    AIMS (if indicated):     Assets:  Communication Skills Resilience Social Support  ADL's:  Intact  Cognition:  WNL  Sleep:  Number of Hours: 2.25    Treatment Plan Summary: Daily contact with patient to assess and evaluate symptoms and progress in treatment and Medication management   -Continue current treatment plan on 11/25/2017 except where noted  -Schizoaffective disorder, bipolar type - Continue abilify 20mg  po qhs - Continue abilify maintena 400mg  IM q30 days (last given ~3/20 as per pt, we will verify with outpatient provider) - Continue depakote ER 500mg  po BID - Draw valproic acid level reviewed 73ug/mL  - Nightmares:  -Initiated Prazosin 1 mg PO QHS  -EPS - Continue cogentin 1mg  po BID prn EPS  - Anxiety - Continue atarax 25mg  po TID prn anxiety  -Agitation - Continue agitation protocol with zydis/ativan/geodon  - Ativan 1 mg available Q6 PRN   - Insomnia - Increased  trazodone 100 mg po QHS        - Dry mouth - Continue biotene 12mL swish and spit PRN dry mouth  -Encourage participation  in groups and therapeutic milieu -Disposition planning will be ongoing   Derrill Center, NP 11/25/2017, 8:38 AM

## 2017-11-25 NOTE — Progress Notes (Signed)
Patient ID: [name], female   DOB: 1971/07/11, 47 y.o.   MRN: [id number]    D: Pt remains very disorganized today on the unit. Pt did attend all groups, but was very tangential. Pt reported that her depression was a 10, her hopelessness was a 0, and her anxiety was a 9. Pt reported that her goal was to love family. Pt took all medication without any problems. Pt reported being negative SI/HI, no AH/VH noted. A: 15 min checks continued for patient safety. R: Pt safety maintained.

## 2017-11-25 NOTE — BHH Group Notes (Signed)
[facility] LCSW Group Therapy Note  Date/Time:  11/25/2017  11:00AM-12:00PM  Type of Therapy and Topic:  Group Therapy:  Music and Mood  Participation Level:  Active   Description of Group: In this process group, members listened to a variety of genres of music and identified that different types of music evoke different responses.  Patients were encouraged to identify music that was soothing for them and music that was energizing for them.  Patients discussed how this knowledge can help with wellness and recovery in various ways including managing depression and anxiety as well as encouraging healthy sleep habits.    Therapeutic Goals: 1. Patients will explore the impact of different varieties of music on mood 2. Patients will verbalize the thoughts they have when listening to different types of music 3. Patients will identify music that is soothing to them as well as music that is energizing to them 4. Patients will discuss how to use this knowledge to assist in maintaining wellness and recovery 5. Patients will explore the use of music as a coping skill  Summary of Patient Progress:  At the beginning of group, patient expressed that she felt good but somewhat angry or upset.  She clarified later that she prefers to think of these emotions as "sad."  She was quite tangential throughout group with some flight of ideas and could not be redirected, so CSW would eventually interrupt her in order to move the group forward.  She accepted this and was pleasant.  At the end of group she said she felt better.  Therapeutic Modalities: Solution Focused Brief Therapy Activity   Selmer Dominion, LCSW

## 2017-11-26 NOTE — Progress Notes (Signed)
DAR NOTE: Patient presents with anxious affect and disorganized with pressured speech.  Verbalizes concern about losing her job at [employer].  Denies suicidal thoughts, pain, auditory and visual hallucinations.  Patient is observed responding to internal stimuli.  Rates depression at 10, hopelessness at 10, and anxiety at 10.  Maintained on routine safety checks.  Medications given as prescribed.  Support and encouragement offered as needed.  Attended group and participated.  States goal for today is "go home."  Patient visible in milieu interacting with peers.  Patient is safe on the unit.

## 2017-11-26 NOTE — Progress Notes (Signed)
Nursing Progress Note: 7p-7a D: Pt currently presents with a animated/anxious/tangential/disorganized affect and behavior. Pt states "I am ready to go home." Interacting more appropriately/less dominating with the milieu. Pt reports good sleep during the previous night with current medication regimen. Pt did attend wrap-up group.  A: Pt provided with medications per providers orders. Pt's labs and vitals were monitored throughout the night. Pt supported emotionally and encouraged to express concerns and questions. Pt educated on medications.  R: Pt's safety ensured with 15 minute and environmental checks. Pt currently denies SI, HI, and AVH. Pt verbally contracts to seek staff if SI,HI, or AVH occurs and to consult with staff before acting on any harmful thoughts. Will continue to monitor.

## 2017-11-26 NOTE — Tx Team (Signed)
Interdisciplinary Treatment and Diagnostic Plan Update  11/26/2017 Time of Session: 3:23 PM  [name] MRN: [id number]  Principal Diagnosis: Schizoaffective disorder, bipolar type [facility])  Secondary Diagnoses: Principal Problem:   Schizoaffective disorder, bipolar type ([place])   Current Medications:  Current Facility-Administered Medications  Medication Dose Route Frequency Provider Last Rate Last Dose  . acetaminophen (TYLENOL) tablet 650 mg  650 mg Oral Q4H PRN Patrecia Pour, NP      . albuterol (PROVENTIL HFA;VENTOLIN HFA) 108 (90 Base) MCG/ACT inhaler 2 puff  2 puff Inhalation Q4H PRN Pennelope Bracken, MD      . antiseptic oral rinse (BIOTENE) solution 15 mL  15 mL Mouth Rinse PRN Pennelope Bracken, MD      . ARIPiprazole (ABILIFY) tablet 20 mg  20 mg Oral QHS Patrecia Pour, NP   20 mg at 11/25/17 2125  . aspirin EC tablet 325 mg  325 mg Oral Q6H PRN Patrecia Pour, NP      . benztropine (COGENTIN) tablet 1 mg  1 mg Oral BID PRN Pennelope Bracken, MD      . divalproex (DEPAKOTE ER) 24 hr tablet 500 mg  500 mg Oral BID Patrecia Pour, NP   500 mg at 11/26/17 9562  . hydrOXYzine (ATARAX/VISTARIL) tablet 25 mg  25 mg Oral TID PRN Patrecia Pour, NP   25 mg at 11/26/17 0419  . LORazepam (ATIVAN) tablet 1 mg  1 mg Oral Q6H PRN Derrill Center, NP      . OLANZapine zydis (ZYPREXA) disintegrating tablet 10 mg  10 mg Oral Q8H PRN Pennelope Bracken, MD   10 mg at 11/22/17 2212   And  . ziprasidone (GEODON) injection 20 mg  20 mg Intramuscular PRN Maris Berger T, MD      . prazosin (MINIPRESS) capsule 1 mg  1 mg Oral QHS Derrill Center, NP   1 mg at 11/25/17 2125  . traZODone (DESYREL) tablet 100 mg  100 mg Oral QHS Derrill Center, NP   100 mg at 11/25/17 2125    PTA Medications: Medications Prior to Admission  Medication Sig Dispense Refill Last Dose  . ABILIFY MAINTENA 400 MG SUSR Inject 400 mg into the muscle every 30 (thirty) days.   0 Past  Week at Unknown time  . albuterol (PROVENTIL HFA;VENTOLIN HFA) 108 (90 Base) MCG/ACT inhaler Inhale 2 puffs into the lungs every 6 (six) hours as needed for wheezing or shortness of breath.   11/17/2017 at Unknown time  . ARIPiprazole (ABILIFY) 20 MG tablet Take 1 tablet (20 mg total) by mouth at bedtime. 30 tablet 0 08/15/2017 at Unknown time  . aspirin EC 325 MG tablet Take 325 mg by mouth every 6 (six) hours as needed for mild pain or moderate pain.    Past Week at Unknown time  . benztropine (COGENTIN) 1 MG tablet Take 1 mg by mouth at bedtime.    Past Week at Unknown time  . budesonide-formoterol (SYMBICORT) 160-4.5 MCG/ACT inhaler Inhale 2 puffs into the lungs 2 (two) times daily. For Asthma (Patient not taking: Reported on 11/18/2017) 1 Inhaler 12 Completed Course at Unknown time  . CRANBERRY PO Take 1 tablet by mouth daily.   Past Month at Unknown time  . divalproex (DEPAKOTE ER) 500 MG 24 hr tablet Take 1 tablet (500 mg total) by mouth 2 (two) times daily. (Patient taking differently: Take 1,000 mg by mouth at bedtime. ) 60 tablet 0 2  weeks  . divalproex (DEPAKOTE) 500 MG DR tablet Take 1,000 mg by mouth at bedtime.    11/15/2017 at unknown time  . hydrOXYzine (ATARAX/VISTARIL) 25 MG tablet Take 1 tablet (25 mg total) by mouth 3 (three) times daily as needed for itching or anxiety (sleep). (Patient not taking: Reported on 08/16/2017) 30 tablet 0 Not Taking at Unknown time  . ibuprofen (ADVIL,MOTRIN) 400 MG tablet Take 2 tablets (800 mg total) by mouth 3 (three) times daily. (Patient not taking: Reported on 10/22/2017) 10 tablet 0 Not Taking at Unknown time  . montelukast (SINGULAIR) 10 MG tablet Take 10 mg by mouth at bedtime.   Past Week at Unknown time  . naproxen (NAPROSYN) 500 MG tablet Take 1 tablet (500 mg total) by mouth 2 (two) times daily. (Patient not taking: Reported on 10/22/2017) 30 tablet 0 Not Taking at Unknown time  . Norethindrone Acetate-Ethinyl Estrad-FE (JUNEL FE 24) 1-20  MG-MCG(24) tablet Take 1 tablet by mouth daily. (Patient not taking: Reported on 10/22/2017) 1 Package 11 Not Taking at Unknown time  . traZODone (DESYREL) 150 MG tablet Take 1 tablet (150 mg total) by mouth at bedtime as needed for sleep. 30 tablet 0 Past Week at Unknown time    Patient Stressors:    Patient Strengths:    Treatment Modalities: Medication Management, Group therapy, Case management,  1 to 1 session with clinician, Psychoeducation, Recreational therapy.   Physician Treatment Plan for Primary Diagnosis: Schizoaffective disorder, bipolar type ([place]) Long Term Goal(s): Improvement in symptoms so as ready for discharge  Short Term Goals: Ability to demonstrate self-control will improve Ability to identify and develop effective coping behaviors will improve  Medication Management: Evaluate patient's response, side effects, and tolerance of medication regimen.  Therapeutic Interventions: 1 to 1 sessions, Unit Group sessions and Medication administration.  Evaluation of Outcomes: Adequate for Discharge  Physician Treatment Plan for Secondary Diagnosis: Principal Problem:   Schizoaffective disorder, bipolar type ([place])   Long Term Goal(s): Improvement in symptoms so as ready for discharge  Short Term Goals: Ability to demonstrate self-control will improve Ability to identify and develop effective coping behaviors will improve  Medication Management: Evaluate patient's response, side effects, and tolerance of medication regimen.  Therapeutic Interventions: 1 to 1 sessions, Unit Group sessions and Medication administration.  Evaluation of Outcomes: Adequate for Discharge   RN Treatment Plan for Primary Diagnosis: Schizoaffective disorder, bipolar type ([name]) Long Term Goal(s): Knowledge of disease and therapeutic regimen to maintain health will improve  Short Term Goals: Ability to demonstrate self-control and Compliance with prescribed medications will  improve  Medication Management: RN will administer medications as ordered by provider, will assess and evaluate patient's response and provide education to patient for prescribed medication. RN will report any adverse and/or side effects to prescribing provider.  Therapeutic Interventions: 1 on 1 counseling sessions, Psychoeducation, Medication administration, Evaluate responses to treatment, Monitor vital signs and CBGs as ordered, Perform/monitor CIWA, COWS, AIMS and Fall Risk screenings as ordered, Perform wound care treatments as ordered.  Evaluation of Outcomes: Adequate for Discharge   LCSW Treatment Plan for Primary Diagnosis: Schizoaffective disorder, bipolar type ([place]) Long Term Goal(s): Safe transition to appropriate next level of care at discharge, Engage patient in therapeutic group addressing interpersonal concerns.  Short Term Goals: Engage patient in aftercare planning with referrals and resources  Therapeutic Interventions: Assess for all discharge needs, 1 to 1 time with Social worker, Explore available resources and support systems, Assess for adequacy in community support network,  Educate family and significant other(s) on suicide prevention, Complete Psychosocial Assessment, Interpersonal group therapy.  Evaluation of Outcomes: Met  Return home, follow up [facility]   Progress in Treatment: Attending groups: Yes Participating in groups: Yes Taking medication as prescribed: Yes Toleration medication: Yes, no side effects reported at this time Family/Significant other contact made: Yes Patient understands diagnosis: No  Limited insight Discussing patient identified problems/goals with staff: Yes Medical problems stabilized or resolved: Yes Denies suicidal/homicidal ideation: Yes Issues/concerns per patient self-inventory: None Other: N/A  New problem(s) identified: None identified at this time.   New Short Term/Long Term Goal(s): "My goal is to  get back to work again, or be able to volunteer somewhere."  Discharge Plan or Barriers:   Reason for Continuation of Hospitalization:  Medication stabilization   Estimated Length of Stay: Likely d/c tomorrow, 4/2  Attendees: Patient:  11/26/2017  3:23 PM  Physician: Maris Berger, MD 11/26/2017  3:23 PM  Nursing: Real Cons, RN 11/26/2017  3:23 PM  RN Care Manager: Lars Pinks, RN 11/26/2017  3:23 PM  Social Worker: Ripley Fraise 11/26/2017  3:23 PM  Recreational Therapist: Winfield Cunas 11/26/2017  3:23 PM  Other: Norberto Sorenson 11/26/2017  3:23 PM  Other:  11/26/2017  3:23 PM    Scribe for Treatment Team:  Roque Lias LCSW 11/26/2017 3:23 PM

## 2017-11-26 NOTE — Progress Notes (Signed)
Patient has been observed up in the dayroom watching tv with minimal interaction with peers. She was informed of her medication that was added and increase in trazodone d/t insomnia on previous night. She was calmer and not as talking as previous night but conversations tangential. . She denies si/hi/a/v hallucinations. Safety maintained on unit with 15 min checks.

## 2017-11-26 NOTE — Progress Notes (Signed)
[facility] MD Progress Note  11/26/2017 2:11 PM [name]  MRN:  [id number]   Evaluation: [name] is evaluated by NP and MD today.  Patient is awake alert and oriented x3.  Continues to report that she is doing well and feeling better as her mood is  Improving.   Patient reports she is hopeful for discharge soon, as she need to get back home pay her bills.  Chart reviewed- noted that patient is engaged and participate during group sessions. Depakote level wnl  73ug/mL. Continues to deny that she is experiencing suicidal or homicidal ideations .  Denies auditory or visual hallucinations however observed responding to internal stimuli. Patient reports good appetite reports she is resting well.  Patient reports she is taking medications as prescribed and tolerating them well.  Patient reports her nightmares has improved with medications.  CSW continue working on discharge disposition. MD to consider discharge 11/27/2017. support and encouragement and reassurance was provided.   History:per assessment noteTonia [name] is a 47 y/o F with history of schizoaffective disorder, bipolar type who was admitted voluntarily from [place] ED with worsening symptoms of agitation, depression, disorganization, SI with plan to stab self, HI towards her co-workers, and poor adherence to her home regimen of depakote. Pt had called [employer] with concern that she was having worsening symptoms and she cited stressor of conflict with her co-workers at [employer]. Pt had received abilify Maintena in the days prior to admission, and she was restarted on previous home medication of depakote (she reported having been off of this medication for at least 3-4 days prior to admission) as well as oral abilifyafter transfer to [facility]. Upon presentation to [facility], pt remained pressured, labile, tangential, and disorganized. She has been demonstrating incremental improvement of her presenting symptoms, and she has been able to participate in groups and the  therapeutic milieu.     Principal Problem: Schizoaffective disorder, bipolar type ([place]) Diagnosis:   Patient Active Problem List   Diagnosis Date Noted  . Noncompliance with treatment [Z91.19] 07/16/2015  . Schizoaffective disorder, bipolar type ([place]) [F25.0] 01/11/2015   Total Time spent with patient: 30 minutes  Past Psychiatric History: see H&P  Past Medical History:  Past Medical History:  Diagnosis Date  . Anemia   . Anxiety   . Asthma   . Bipolar 1 disorder ([place])   . Breast cancer ([place])   . Depression   . Diabetes mellitus without complication ([place])   . Hypertension   . Insomnia, persistent   . Schizophrenic disorder ([place])   . Seizures ([place])     Past Surgical History:  Procedure Laterality Date  . BREAST SURGERY Left   . TONSILLECTOMY     Family History:  Family History  Problem Relation Age of Onset  . Depression Mother   . Gout Mother   . Cancer Father        prostate  . Other Father        lung issue  . Alcoholism Other   . Heart attack Paternal Grandfather   . Heart attack Paternal Grandmother   . Heart attack Maternal Grandmother   . Heart attack Maternal Grandfather   . Depression Son   . Anxiety disorder Son    Family Psychiatric  History: see H&P Social History:  Social History   Substance and Sexual Activity  Alcohol Use No     Social History   Substance and Sexual Activity  Drug Use No    Social History   Socioeconomic  History  . Marital status: Single    Spouse name: Not on file  . Number of children: Not on file  . Years of education: Not on file  . Highest education level: Not on file  Occupational History  . Not on file  Social Needs  . Financial resource strain: Not on file  . Food insecurity:    Worry: Not on file    Inability: Not on file  . Transportation needs:    Medical: Not on file    Non-medical: Not on file  Tobacco Use  . Smoking status: Former Smoker    Types: Cigarettes  . Smokeless tobacco: Never  Used  Substance and Sexual Activity  . Alcohol use: No  . Drug use: No  . Sexual activity: Never    Birth control/protection: Pill  Lifestyle  . Physical activity:    Days per week: Not on file    Minutes per session: Not on file  . Stress: Not on file  Relationships  . Social connections:    Talks on phone: Not on file    Gets together: Not on file    Attends religious service: Not on file    Active member of club or organization: Not on file    Attends meetings of clubs or organizations: Not on file    Relationship status: Not on file  Other Topics Concern  . Not on file  Social History Narrative  . Not on file   Additional Social History:                         Sleep: Good  Appetite:  Good  Current Medications: Current Facility-Administered Medications  Medication Dose Route Frequency Provider Last Rate Last Dose  . acetaminophen (TYLENOL) tablet 650 mg  650 mg Oral Q4H PRN Patrecia Pour, NP      . albuterol (PROVENTIL HFA;VENTOLIN HFA) 108 (90 Base) MCG/ACT inhaler 2 puff  2 puff Inhalation Q4H PRN Pennelope Bracken, MD      . antiseptic oral rinse (BIOTENE) solution 15 mL  15 mL Mouth Rinse PRN Pennelope Bracken, MD      . ARIPiprazole (ABILIFY) tablet 20 mg  20 mg Oral QHS Patrecia Pour, NP   20 mg at 11/25/17 2125  . aspirin EC tablet 325 mg  325 mg Oral Q6H PRN Patrecia Pour, NP      . benztropine (COGENTIN) tablet 1 mg  1 mg Oral BID PRN Pennelope Bracken, MD      . divalproex (DEPAKOTE ER) 24 hr tablet 500 mg  500 mg Oral BID Patrecia Pour, NP   500 mg at 11/26/17 6712  . hydrOXYzine (ATARAX/VISTARIL) tablet 25 mg  25 mg Oral TID PRN Patrecia Pour, NP   25 mg at 11/26/17 0419  . LORazepam (ATIVAN) tablet 1 mg  1 mg Oral Q6H PRN Derrill Center, NP      . OLANZapine zydis (ZYPREXA) disintegrating tablet 10 mg  10 mg Oral Q8H PRN Pennelope Bracken, MD   10 mg at 11/22/17 2212   And  . ziprasidone (GEODON) injection  20 mg  20 mg Intramuscular PRN Maris Berger T, MD      . prazosin (MINIPRESS) capsule 1 mg  1 mg Oral QHS Derrill Center, NP   1 mg at 11/25/17 2125  . traZODone (DESYREL) tablet 100 mg  100 mg Oral QHS Derrill Center, NP  100 mg at 11/25/17 2125    Lab Results:  No results found for this or any previous visit (from the past 48 hour(s)).  Blood Alcohol level:  Lab Results  Component Value Date   ETH <10 11/18/2017   ETH <10 32/67/1245    Metabolic Disorder Labs: Lab Results  Component Value Date   HGBA1C 5.0 11/21/2017   MPG 96.8 11/21/2017   MPG 123 01/15/2015   Lab Results  Component Value Date   PROLACTIN 5.4 11/21/2017   Lab Results  Component Value Date   CHOL 180 11/21/2017   TRIG 55 11/21/2017   HDL 54 11/21/2017   CHOLHDL 3.3 11/21/2017   VLDL 11 11/21/2017   LDLCALC 115 (H) 11/21/2017   LDLCALC 111 (H) 01/15/2015    Physical Findings: AIMS:  , ,  ,  ,    CIWA:    COWS:     Musculoskeletal: Strength & Muscle Tone: within normal limits Gait & Station: normal Patient leans: N/A  Psychiatric Specialty Exam: Physical Exam  Nursing note and vitals reviewed. Constitutional: She is oriented to person, place, and time. She appears well-developed.  Cardiovascular: Normal rate.  Neurological: She is alert and oriented to person, place, and time.  Skin: Skin is warm and dry.  Psychiatric: She has a normal mood and affect.    Review of Systems  Psychiatric/Behavioral: Negative for depression, hallucinations and suicidal ideas. The patient has insomnia (charted 2 hours on 3/30). The patient is not nervous/anxious.   All other systems reviewed and are negative.   Blood pressure 123/82, pulse 86, temperature 98.1 F (36.7 C), temperature source Oral, resp. rate 16, height 5\' 6"  (1.676 m), weight 100.2 kg (221 lb).Body mass index is 35.67 kg/m.  General Appearance: Casual and Fairly Groomed  Eye Contact:  Good  Speech:  Clear and Coherent and  Pressured - improving   Volume:  Normal  Mood:  Euthymic  Affect:  Blunt and Congruent  Thought Process:  Disorganized and Descriptions of Associations: Tangential improving   Orientation:  Full (Time, Place, and Person)  Thought Content:  Tangential  Suicidal Thoughts:  No  Homicidal Thoughts:  No  Memory:  Immediate;   Fair Recent;   Fair Remote;   Fair  Judgement:  Fair  Insight:  Lacking  Psychomotor Activity:  Normal  Concentration:  Concentration: Fair  Recall:  [employer] of Knowledge:  Fair  Language:  Fair  Akathisia:  No  Handed:    AIMS (if indicated):     Assets:  Communication Skills Resilience Social Support  ADL's:  Intact  Cognition:  WNL  Sleep:  Number of Hours: 4.75 continues to improve    Treatment Plan Summary: Daily contact with patient to assess and evaluate symptoms and progress in treatment and Medication management   -Continue current treatment plan on 11/25/2017 except where noted  -Schizoaffective disorder, bipolar type - Continue abilify 20mg  po qhs - Continue abilify maintena 400mg  IM q30 days (last given ~3/20 as per pt, we will verify with outpatient provider) - Continue depakote ER 500mg  po BID - Draw valproic acid level reviewed 73ug/mL  - Nightmares:  -Continue Prazosin 1 mg PO QHS  -EPS - Continue cogentin 1mg  po BID prn EPS  - Anxiety - Continue atarax 25mg  po TID prn anxiety  -Agitation - Continue agitation protocol with zydis/ativan/geodon  - Ativan 1 mg available Q6 PRN   - Insomnia - IContinue trazodone 100 mg po QHS        -  Dry mouth - Continue biotene 39mL swish and spit PRN dry mouth  -Encourage participation in groups and therapeutic milieu -Disposition planning will be ongoing   Derrill Center, NP 11/26/2017, 2:11 PM

## 2017-11-26 NOTE — [facility] Group Notes (Signed)
LCSW Group Therapy Note   11/26/2017 1:15pm   Type of Therapy and Topic:  Group Therapy:  Overcoming Obstacles   Participation Level:  Active   Description of Group:    In this group patients will be encouraged to explore what they see as obstacles to their own wellness and recovery. They will be guided to discuss their thoughts, feelings, and behaviors related to these obstacles. The group will process together ways to cope with barriers, with attention given to specific choices patients can make. Each patient will be challenged to identify changes they are motivated to make in order to overcome their obstacles. This group will be process-oriented, with patients participating in exploration of their own experiences as well as giving and receiving support and challenge from other group members.   Therapeutic Goals: 1. Patient will identify personal and current obstacles as they relate to admission. 2. Patient will identify barriers that currently interfere with their wellness or overcoming obstacles.  3. Patient will identify feelings, thought process and behaviors related to these barriers. 4. Patient will identify two changes they are willing to make to overcome these obstacles:      Summary of Patient Progress   Stayed the entire time, engaged throughout.  Still blurting out, but redirectable.  Said her biggest obstacle is that she does not know how one gains trust in a relationship.  Made it sound like it was romantic relationship she is looking for.  This was the part related to the topic.  The rest of her thoughts related to others' comments, including rap music, divorce, separation and current weather forecast.   Therapeutic Modalities:   Cognitive Behavioral Therapy Solution Focused Therapy Motivational Interviewing Relapse Prevention [place], LCSW 11/26/2017 3:27 PM

## 2017-11-26 NOTE — Progress Notes (Signed)
Recreation Therapy Notes  Date: 4.1.19 Time: 10:00 a.m Location: [address] Dayroom   Group Topic: Triggers   Goal Area(s) Addresses:  Goal 1.1: To identify triggers and coping skills  - Group will identify at least three triggers for anxiety  - Group will identify at least three coping skill for anxiety   - Group will identify at least three thoughts and physical symptoms for anxiety  - Group will participate in Recreation Therapy tx.   Behavioral Response: Appropriate   Intervention: Worksheet    Activity: Patients completed an anxiety worksheet, identifying triggers for anxiety, physical symptoms, thoughts, and ways to cope from triggers   Education: Triggers, Coping Skills   Education Outcome: Acknowledges Education  Clinical Observations/Feedback: Patient attended and participated appropriately during Recreation Therapy group session successfully identifying three triggers for anxiety. Patient was able to identify three physical symptoms and thoughts they have when they feel anxious. Patient was able to identify three positive coping skills she use when she feel anxious. Patient participated during opening and closing discussion. Patient successfully met Goal 1.1 (see above).   Ranell Patrick, Recreation Therapy Intern   Ranell Patrick 11/26/2017 10:52 AM

## 2017-11-27 MED ORDER — DIVALPROEX SODIUM ER 500 MG PO TB24: 500.0000 mg | ORAL_TABLET | Freq: Two times a day (BID) | ORAL | 0 refills | Status: DC

## 2017-11-27 MED ORDER — ARIPIPRAZOLE 20 MG PO TABS: 20.0000 mg | ORAL_TABLET | Freq: Every day | ORAL | 0 refills | Status: DC

## 2017-11-27 MED ORDER — TRAZODONE HCL 100 MG PO TABS: 100.0000 mg | ORAL_TABLET | Freq: Every day | ORAL | 0 refills | Status: DC

## 2017-11-27 MED ORDER — BENZTROPINE MESYLATE 1 MG PO TABS: 1.0000 mg | ORAL_TABLET | Freq: Two times a day (BID) | ORAL | 0 refills | Status: DC | PRN

## 2017-11-27 MED ORDER — PRAZOSIN HCL 1 MG PO CAPS: 1.0000 mg | ORAL_CAPSULE | Freq: Every day | ORAL | 0 refills | Status: DC

## 2017-11-27 NOTE — Plan of Care (Signed)
4.2.19. Patient attended and participated appropriately during Recreation Therapy group treatment successfully engaging in groups with a calm and appropriate mood at least 2x within 5 Recreation Therapy group sessions

## 2017-11-27 NOTE — Progress Notes (Signed)
Patient discharged to lobby. Patient was stable and appreciative at that time. All papers and prescriptions were given and valuables returned. Verbal understanding expressed. Denies SI/HI and A/VH. Patient given opportunity to express concerns and ask questions.  

## 2017-11-27 NOTE — Progress Notes (Signed)
Recreation Therapy Notes  Date: 4.2.19 Time: 10:00 p.m. Location: [address] Dayroom   Group Topic: Self-Esteem   Goal Area(s) Addresses:  Goal 1.1: To increase self-esteem  - Group will improve mood through participation during Recreation Therapy tx.  - Group will identify at least one positive affirmation by the end of therapy session.   - Group will identify the importance of self-esteem    Behavioral Response: Appropriate   Intervention: Art   Activity: Patients were to choose a positive affirmation template. Patents were then given the opportunity to design their poster using the colored pencils and markers provided. Afterwards, Patients shared their positive affirmation to the group and explained how it can help improve their self-esteem.   Education: Self-Esteem   Education Outcome: Acknowledges Education  Clinical Observations/Feedback: Patient attended and participated appropriately during Recreation Therapy group treatment. Patient was able to share poster with peers, successfully identifying the positive affirmation they chose and how it can improve their self esteem. Patient participated during opening and closing discussion. Patient successfully met Goal 1.1 (see above).    , Recreation Therapy Intern    11/27/2017 11:12 AM 

## 2017-11-27 NOTE — BHH Suicide Risk Assessment (Signed)
[facility] Discharge Suicide Risk Assessment   Principal Problem: Schizoaffective disorder, bipolar type [facility]) Discharge Diagnoses:  Patient Active Problem List   Diagnosis Date Noted  . Noncompliance with treatment [Z91.19] 07/16/2015  . Schizoaffective disorder, bipolar type ([place]) [F25.0] 01/11/2015    Total Time spent with patient: 30 minutes  Musculoskeletal: Strength & Muscle Tone: within normal limits Gait & Station: normal Patient leans: Backward  Psychiatric Specialty Exam: Review of Systems  Constitutional: Negative for chills and fever.  Respiratory: Negative for cough and hemoptysis.   Cardiovascular: Negative for chest pain.  Gastrointestinal: Negative for abdominal pain, heartburn, nausea and vomiting.  Psychiatric/Behavioral: Negative for depression, hallucinations and suicidal ideas. The patient is not nervous/anxious.     Blood pressure 119/77, pulse 86, temperature 98.1 F (36.7 C), temperature source Oral, resp. rate 16, height 5\' 6"  (1.676 m), weight 100.2 kg (221 lb).Body mass index is 35.67 kg/m.  General Appearance: Casual and Fairly Groomed  Engineer, water::  Good  Speech:  Clear and Coherent and Normal Rate  Volume:  Normal  Mood:  Euthymic  Affect:  Appropriate and Congruent  Thought Process:  Coherent and Goal Directed  Orientation:  Full (Time, Place, and Person)  Thought Content:  Logical  Suicidal Thoughts:  No  Homicidal Thoughts:  No  Memory:  Immediate;   Fair Recent;   Fair Remote;   Fair  Judgement:  Fair  Insight:  Fair  Psychomotor Activity:  Normal  Concentration:  Good  Recall:  Good  Fund of Knowledge:Good  Language: Good  Akathisia:  No  Handed:    AIMS (if indicated):     Assets:  Communication Skills Resilience Social Support  Sleep:  Number of Hours: 5.75  Cognition: WNL  ADL's:  Intact   Mental Status Per Nursing Assessment::   On Admission:  NA  Demographic Factors:  Low socioeconomic status and Living alone  Loss  Factors: Financial problems/change in socioeconomic status  Historical Factors: Impulsivity  Risk Reduction Factors:   Positive social support, Positive therapeutic relationship and Positive coping skills or problem solving skills  Continued Clinical Symptoms:  Bipolar Disorder:   Mixed State Schizophrenia:   Paranoid or undifferentiated type  Cognitive Features That Contribute To Risk:  None    Suicide Risk:  Minimal: No identifiable suicidal ideation.  Patients presenting with no risk factors but with morbid ruminations; may be classified as minimal risk based on the severity of the depressive symptoms  Follow-up Information    [facility], [name] [facility] Follow up on 12/12/2017.   Specialty:  Family Medicine Why:  Follow up is scheduled for April 17th, 2019.  Injection is scheduled for April 18th, 2019. Contact information: [address] [address] [phone]         Subjective Data:  [name] is a 47 y/o F with history of schizoaffective disorder, bipolar type who was admitted voluntarily from [facility] ED with worsening symptoms of agitation, depression, disorganization, SI with plan to stab self, HI towards her co-workers, and poor adherence to her home regimen of depakote. Pt had called [employer] with concern that she was having worsening symptoms and she cited stressor of conflict with her co-workers at [employer]. Pt had received abilify Maintena in the days prior to admission, and she was restarted on previous home medication of depakote (she reported having been off of this medication for at least 3-4 days prior to admission) as well as oral abilifyafter transfer to [facility].  Upon presentation to [facility], pt remained pressured, labile, tangential, and disorganized. She has been demonstrating incremental improvement of her presenting symptoms, and she has been able to participate in groups and the therapeutic milieu.  Today upon  evaluation,pt shares, "I'm good. I'm ready to go." Pt denies specific concerns. She denies physical complaints. She denies SI/HI/AH/VH. Her appetite is good. She is sleeping well. She is tolerating her current medications well, without side effects. She plans to continue to follow up at [facility]. She thinks that she would like to return to working at [employer] and we discussed about the importance of balancing the stressors of work so that she does not become overwhelmed, and pt verbalized good understanding. She plans to go back to living independently in her apartment. She was able to engage in safety planning including plan to return to [facility] or contact emergency services if she feels unable to maintain her own safety or the safety of others. Pt had no further questions, comments, or concerns.   Plan Of Care/Follow-up recommendations:   -Discharge to outpatient level of care  -Schizoaffective disorder, bipolar type - Continue abilify 20mg  po qhs - Continue abilify maintena 400mg  IM q30 days - Continue depakote ER 500mg  po BID (depakote level 73 on 3/30)   -EPS - Continue cogentin 1mg  po BID prn EPS  - Anxiety - Continue atarax 25mg  po TID prn anxiety  - Insomnia - Continue trazodone 50mg  po qhs prn insomnia  Activity:  as tolerated Diet:  normal Tests:  NA Other:  see above for DC plan  Pennelope Bracken, MD 11/27/2017, 8:17 AM

## 2017-11-27 NOTE — Discharge Summary (Addendum)
Physician Discharge Summary Note  Patient:  [name] is an 47 y.o., female MRN:  [id number] DOB:  10-Mar-1971 Patient phone:  [phone] (home)  Patient address:   [address] [place] [address],  Total Time spent with patient: 20 minutes  Date of Admission:  11/19/2017 Date of Discharge: 11/27/2017  Reason for Admission: Per admission assessment- Harlem Thresher is a 47 y/o F with history of schizoaffective disorder, bipolar type who was admitted voluntarily from [facility] ED with worsening symptoms of agitation, depression, disorganization, SI with plan to stab self, HI towards her co-workers, and poor adherence to her home regimen of depakote. Pt had called [employer] with concern that she was having worsening symptoms and she cited stressor of conflict with her co-workers at [employer]. Pt was restarted on previous home medications of abilify and depakote, and she was transferred to [facility] for additional treatment and stabilization.  Principal Problem: Schizoaffective disorder, bipolar type [facility]) Discharge Diagnoses: Patient Active Problem List   Diagnosis Date Noted  . Noncompliance with treatment [Z91.19] 07/16/2015  . Schizoaffective disorder, bipolar type ([place]) [F25.0] 01/11/2015    Past Psychiatric History:  Past Medical History:  Past Medical History:  Diagnosis Date  . Anemia   . Anxiety   . Asthma   . Bipolar 1 disorder ([place])   . Breast cancer ([place])   . Depression   . Diabetes mellitus without complication ([place])   . Hypertension   . Insomnia, persistent   . Schizophrenic disorder ([place])   . Seizures ([place])     Past Surgical History:  Procedure Laterality Date  . BREAST SURGERY Left   . TONSILLECTOMY     Family History:  Family History  Problem Relation Age of Onset  . Depression Mother   . Gout Mother   . Cancer Father        prostate  . Other Father        lung issue  . Alcoholism Other   . Heart attack Paternal Grandfather   . Heart attack  Paternal Grandmother   . Heart attack Maternal Grandmother   . Heart attack Maternal Grandfather   . Depression Son   . Anxiety disorder Son    Family Psychiatric  History:  Social History:  Social History   Substance and Sexual Activity  Alcohol Use No     Social History   Substance and Sexual Activity  Drug Use No    Social History   Socioeconomic History  . Marital status: Single    Spouse name: Not on file  . Number of children: Not on file  . Years of education: Not on file  . Highest education level: Not on file  Occupational History  . Not on file  Social Needs  . Financial resource strain: Not on file  . Food insecurity:    Worry: Not on file    Inability: Not on file  . Transportation needs:    Medical: Not on file    Non-medical: Not on file  Tobacco Use  . Smoking status: Former Smoker    Types: Cigarettes  . Smokeless tobacco: Never Used  Substance and Sexual Activity  . Alcohol use: No  . Drug use: No  . Sexual activity: Never    Birth control/protection: Pill  Lifestyle  . Physical activity:    Days per week: Not on file    Minutes per session: Not on file  . Stress: Not on file  Relationships  . Social connections:  Talks on phone: Not on file    Gets together: Not on file    Attends religious service: Not on file    Active member of club or organization: Not on file    Attends meetings of clubs or organizations: Not on file    Relationship status: Not on file  Other Topics Concern  . Not on file  Social History Narrative  . Not on file    Hospital Course:  Brennah Quraishi was admitted for Schizoaffective disorder, bipolar type [facility]) , with psychosis and crisis management.  Pt was treated discharged with the medications listed below under Medication List.  Medical problems were identified and treated as needed.  Home medications were restarted as appropriate.  Improvement was monitored by observation and [name] 's daily report of  symptom reduction.  Emotional and mental status was monitored by daily self-inventory reports completed by [name] and clinical staff.         [name] was evaluated by the treatment team for stability and plans for continued recovery upon discharge. [name] 's motivation was an integral factor for scheduling further treatment. Employment, transportation, bed availability, health status, family support, and any pending legal issues were also considered during hospital stay. Pt was offered further treatment options upon discharge including but not limited to Residential, Intensive Outpatient, and Outpatient treatment.  Lounell Schumacher will follow up with the services as listed below under Follow Up Information.     Upon completion of this admission the patient was both mentally and medically stable for discharge denying suicidal or homicidal ideation. Patient continues to present with thought blocking and tangential speech, however her mood and affect has improved and patient appears stable since her admission to the unit.   [name] responded well to treatment with Abilify 20 mg, Depakote 500 mg, trazadone 100 mg without adverse effects.. Pt demonstrated improvement without reported or observed adverse effects to the point of stability appropriate for outpatient management. Pertinent labs include: Valproic acide level 73, prolactin and Lipid panel  for which outpatient follow-up is necessary for lab recheck as mentioned below. Reviewed CBC, CMP, BAL, and UDS; all unremarkable aside from noted exceptions.   Physical Findings: AIMS:  , ,  ,  ,    CIWA:    COWS:     Musculoskeletal: Strength & Muscle Tone: within normal limits Gait & Station: normal Patient leans: N/A  Psychiatric Specialty Exam: See SRA by MD  Physical Exam  Vitals reviewed. Constitutional: She is oriented to person, place, and time. She appears well-developed.  Neurological: She is alert and oriented to person,  place, and time.  Psychiatric: She has a normal mood and affect. Her behavior is normal.    Review of Systems  Psychiatric/Behavioral: Positive for hallucinations (improving ). Negative for depression and suicidal ideas. Memory loss: improving  The patient is nervous/anxious. The patient does not have insomnia.   All other systems reviewed and are negative.   Blood pressure 118/79, pulse (!) 112, temperature 98.3 F (36.8 C), temperature source Oral, resp. rate 18, height 5\' 6"  (1.676 m), weight 100.2 kg (221 lb).Body mass index is 35.67 kg/m.      Has this patient used any form of tobacco in the last 30 days? (Cigarettes, Smokeless Tobacco, Cigars, and/or Pipes)  No  Blood Alcohol level:  Lab Results  Component Value Date   [facility] <10 11/18/2017   ETH <10 21/30/8657    Metabolic Disorder Labs:  Lab Results  Component  Value Date   HGBA1C 5.0 11/21/2017   MPG 96.8 11/21/2017   MPG 123 01/15/2015   Lab Results  Component Value Date   PROLACTIN 5.4 11/21/2017   Lab Results  Component Value Date   CHOL 180 11/21/2017   TRIG 55 11/21/2017   HDL 54 11/21/2017   CHOLHDL 3.3 11/21/2017   VLDL 11 11/21/2017   LDLCALC 115 (H) 11/21/2017   LDLCALC 111 (H) 01/15/2015    See Psychiatric Specialty Exam and Suicide Risk Assessment completed by Attending Physician prior to discharge.  Discharge destination:  Home  Is patient on multiple antipsychotic therapies at discharge:  No   Has Patient had three or more failed trials of antipsychotic monotherapy by history:  No  Recommended Plan for Multiple Antipsychotic Therapies: NA  Discharge Instructions    Diet - low sodium heart healthy   Complete by:  As directed    Diet - low sodium heart healthy   Complete by:  As directed    Discharge instructions   Complete by:  As directed    Take all medications as prescribed. Keep all follow-up appointments as scheduled.  Do not consume alcohol or use illegal drugs while on prescription  medications. Report any adverse effects from your medications to your primary care provider promptly.  In the event of recurrent symptoms or worsening symptoms, call 911, a crisis hotline, or go to the nearest emergency department for evaluation.   Discharge instructions   Complete by:  As directed    Take all medications as prescribed. Keep all follow-up appointments as scheduled.  Do not consume alcohol or use illegal drugs while on prescription medications. Report any adverse effects from your medications to your primary care provider promptly.  In the event of recurrent symptoms or worsening symptoms, call 911, a crisis hotline, or go to the nearest emergency department for evaluation.   Increase activity slowly   Complete by:  As directed    Increase activity slowly   Complete by:  As directed      Allergies as of 11/27/2017      Reactions   Haldol [haloperidol Decanoate] Other (See Comments)   Stiffness, eyes bulging   Penicillins Nausea And Vomiting   Has patient had a PCN reaction causing immediate rash, facial/tongue/throat swelling, SOB or lightheadedness with hypotension:UNSURE  Has patient had a PCN reaction causing severe rash involving mucus membranes or skin necrosis: UNSURE Has patient had a PCN reaction that required hospitalization:UNSURE Has patient had a PCN reaction occurring within the last 10 years:No If all of the above answers are "NO", then may proceed with Cephalosporin use. CHILDHOOD REACTION   Pollen Extract    Shrimp [shellfish Allergy] Rash      Medication List    STOP taking these medications   aspirin EC 325 MG tablet   CRANBERRY PO   hydrOXYzine 25 MG tablet Commonly known as:  ATARAX/VISTARIL   ibuprofen 400 MG tablet Commonly known as:  ADVIL,MOTRIN   naproxen 500 MG tablet Commonly known as:  NAPROSYN   Norethindrone Acetate-Ethinyl Estrad-FE 1-20 MG-MCG(24) tablet Commonly known as:  JUNEL FE 24     TAKE these medications      Indication  albuterol 108 (90 Base) MCG/ACT inhaler Commonly known as:  PROVENTIL HFA;VENTOLIN HFA Inhale 2 puffs into the lungs every 6 (six) hours as needed for wheezing or shortness of breath.  Indication:  Asthma   ARIPiprazole 20 MG tablet Commonly known as:  ABILIFY Take 1 tablet (20  mg total) by mouth at bedtime. What changed:  Another medication with the same name was removed. Continue taking this medication, and follow the directions you see here.  Indication:  Major Depressive Disorder   benztropine 1 MG tablet Commonly known as:  COGENTIN Take 1 tablet (1 mg total) by mouth 2 (two) times daily as needed (EPS). What changed:    when to take this  reasons to take this  Indication:  Extrapyramidal Reaction caused by Medications   budesonide-formoterol 160-4.5 MCG/ACT inhaler Commonly known as:  SYMBICORT Inhale 2 puffs into the lungs 2 (two) times daily. For Asthma  Indication:  Asthma   divalproex 500 MG 24 hr tablet Commonly known as:  DEPAKOTE ER Take 1 tablet (500 mg total) by mouth 2 (two) times daily. What changed:    how much to take  when to take this  Another medication with the same name was removed. Continue taking this medication, and follow the directions you see here.  Indication:  mood stabilization   montelukast 10 MG tablet Commonly known as:  SINGULAIR Take 10 mg by mouth at bedtime.  Indication:  Asthma   prazosin 1 MG capsule Commonly known as:  MINIPRESS Take 1 capsule (1 mg total) by mouth at bedtime.  Indication:  High Blood Pressure Disorder   traZODone 100 MG tablet Commonly known as:  DESYREL Take 1 tablet (100 mg total) by mouth at bedtime. What changed:    medication strength  how much to take  when to take this  reasons to take this  Indication:  [place], Jinny Blossom Total Access Follow up on 12/12/2017.   Specialty:  Family Medicine Why:  Follow up is scheduled for  April 17th, 2019.  Injection is scheduled for April 18th, 2019. Contact information: [address] [place] [address] [phone]           Follow-up recommendations:  Activity:  as tolerated Diet:  heart healthy  Comments:  Take all medications as prescribed. Keep all follow-up appointments as scheduled.  Do not consume alcohol or use illegal drugs while on prescription medications. Report any adverse effects from your medications to your primary care provider promptly.  In the event of recurrent symptoms or worsening symptoms, call 911, a crisis hotline, or go to the nearest emergency department for evaluation.   Signed: Derrill Center, NP 11/27/2017, 1:42 PM   Patient seen, Suicide Assessment Completed.  Disposition Plan Reviewed

## 2017-11-27 NOTE — Progress Notes (Signed)
Recreation Therapy Notes  INPATIENT RECREATION TR PLAN  Patient Details Name: [name] MRN: [id number] DOB: June 14, 1971 Today's Date: 11/27/2017  Rec Therapy Plan Is patient appropriate for Therapeutic Recreation?: Yes Treatment times per week: At least three  Estimated Length of Stay: 5-7 days  TR Treatment/Interventions: Group participation (Approrpriate participation in Recreation Therapy group tx)  Discharge Criteria Pt will be discharged from therapy if:: Discharged Treatment plan/goals/alternatives discussed and agreed upon by:: Patient/family  Discharge Summary Short term goals set: See Care Plan  Short term goals met: Complete Progress toward goals comments: Groups attended Which groups?: Self-esteem, Wellness, Coping skills, Triggers, Leisure Education  Therapeutic equipment acquired: None  Reason patient discharged from therapy: Discharge from hospital Pt/family agrees with progress & goals achieved: Yes Date patient discharged from therapy: 11/27/17  Ranell Patrick, Recreation Therapy Intern   Ranell Patrick 11/27/2017, 11:39 AM

## 2017-11-27 NOTE — Progress Notes (Signed)
  [facility] Adult Case Management Discharge Plan :  Will you be returning to the same living situation after discharge:  Yes,  home At discharge, do you have transportation home?: Yes,  family Do you have the ability to pay for your medications: Yes,  MCR  Release of information consent forms completed and in the chart;  [name] signature needed at discharge.  Patient to Follow up at: Follow-up Information    Care, Jinny Blossom [facility] Follow up on 12/12/2017.   Specialty:  Family Medicine Why:  Follow up is scheduled for April 17th, 2019.  Injection is scheduled for April 18th, 2019. Contact information: [address] [place] [address] [phone]           Next level of care provider has access to [place] and Suicide Prevention discussed: Yes,  yes     Has patient been referred to the Quitline?: N/A patient is not a smoker  Patient has been referred for addiction treatment: [place], LCSW 11/27/2017, 9:02 AM

## 2017-11-30 ENCOUNTER — Emergency Department (HOSPITAL_COMMUNITY)
Admission: EM | Admit: 2017-11-30 | Discharge: 2017-11-30 | Disposition: A | Discharge status: Home / Self Care | Payer: Medicare HMO | Attending: Emergency Medicine | Admitting: Emergency Medicine

## 2017-11-30 ENCOUNTER — Encounter (HOSPITAL_COMMUNITY): Payer: Self-pay | Admitting: Emergency Medicine

## 2017-11-30 DIAGNOSIS — E119 Type 2 diabetes mellitus without complications: Secondary | ICD-10-CM | POA: Diagnosis not present

## 2017-11-30 DIAGNOSIS — Z87891 Personal history of nicotine dependence: Secondary | ICD-10-CM | POA: Diagnosis not present

## 2017-11-30 DIAGNOSIS — I10 Essential (primary) hypertension: Secondary | ICD-10-CM | POA: Insufficient documentation

## 2017-11-30 DIAGNOSIS — Z79899 Other long term (current) drug therapy: Secondary | ICD-10-CM | POA: Insufficient documentation

## 2017-11-30 DIAGNOSIS — J45909 Unspecified asthma, uncomplicated: Secondary | ICD-10-CM | POA: Diagnosis not present

## 2017-11-30 DIAGNOSIS — R609 Edema, unspecified: Secondary | ICD-10-CM | POA: Diagnosis not present

## 2017-11-30 MED ORDER — POTASSIUM CHLORIDE ER 10 MEQ PO TBCR: 10.0000 meq | EXTENDED_RELEASE_TABLET | Freq: Every day | ORAL | 0 refills | Status: DC

## 2017-11-30 MED ORDER — HYDROCHLOROTHIAZIDE 25 MG PO TABS: 25.0000 mg | ORAL_TABLET | Freq: Every day | ORAL | 0 refills | Status: DC

## 2017-11-30 NOTE — ED Triage Notes (Signed)
Pt from home via EMS with c/o bilateral leg edema. Pt states she has been walking today. Pt states she noticed this 2 hours ago.

## 2017-11-30 NOTE — Discharge Instructions (Addendum)
Leg swelling which you are having is most likely related to the prazosin which was prescribed for hypertension.  Stop taking the prazosin at this time.  We are prescribing hydrochlorothiazide to treat the blood pressure in the leg edema.  He will need to take a potassium pill, or eat foods that contain plenty of potassium to avoid low potassium levels in the blood.  Also stay on a low-salt diet, to improve your leg swelling.  It is important to follow-up with your primary care doctor in 1 week to be reassessed for blood pressure, leg swelling and potassium level.

## 2017-11-30 NOTE — ED Notes (Signed)
Bed: WTR6 Expected date:  Expected time:  Means of arrival:  Comments: 

## 2017-11-30 NOTE — ED Provider Notes (Signed)
[place] DEPT Provider Note   CSN: [id number] Arrival date & time: 11/30/17  0623     History   Chief Complaint Chief Complaint  Patient presents with  . Leg Swelling    HPI [name] is a 47 y.o. female.  She complains of bilateral lower leg swelling, since starting on prazosin, for hypertension, following a recent hospitalization for psychiatric illness.  This is apparently a new treatment for her for blood pressure.  She denies shortness of breath, cough, chest pain, weakness or dizziness.  She feels like her psychiatric symptoms are under control.  There are no other known modifying factors.   HPI  Past Medical History:  Diagnosis Date  . Anemia   . Anxiety   . Asthma   . Bipolar 1 disorder ([place])   . Breast cancer ([place])   . Depression   . Diabetes mellitus without complication ([place])   . Hypertension   . Insomnia, persistent   . Schizophrenic disorder ([place])   . Seizures [facility])     Patient Active Problem List   Diagnosis Date Noted  . Noncompliance with treatment 07/16/2015  . Schizoaffective disorder, bipolar type ([place]) 01/11/2015    Past Surgical History:  Procedure Laterality Date  . BREAST SURGERY Left   . TONSILLECTOMY       OB History    Gravida  2   Para  2   Term  2   Preterm      AB      Living  2     SAB      TAB      Ectopic      Multiple      Live Births  2            Home Medications    Prior to Admission medications   Medication Sig Start Date End Date Taking? Authorizing Provider  albuterol (PROVENTIL HFA;VENTOLIN HFA) 108 (90 Base) MCG/ACT inhaler Inhale 2 puffs into the lungs every 6 (six) hours as needed for wheezing or shortness of breath.   Yes [provider]  ARIPiprazole (ABILIFY) 20 MG tablet Take 1 tablet (20 mg total) by mouth at bedtime. 11/27/17 11/02/19 Yes Derrill Center, NP  benztropine (COGENTIN) 1 MG tablet Take 1 tablet (1 mg total) by mouth 2 (two) times  daily as needed (EPS). 11/27/17  Yes Derrill Center, NP  budesonide-formoterol (SYMBICORT) 160-4.5 MCG/ACT inhaler Inhale 2 puffs into the lungs 2 (two) times daily. For Asthma 01/20/15  Yes Rankin, Shuvon B, NP  divalproex (DEPAKOTE ER) 500 MG 24 hr tablet Take 1 tablet (500 mg total) by mouth 2 (two) times daily. 11/27/17 01/08/20 Yes Derrill Center, NP  montelukast (SINGULAIR) 10 MG tablet Take 10 mg by mouth at bedtime.   Yes [provider]  traZODone (DESYREL) 100 MG tablet Take 1 tablet (100 mg total) by mouth at bedtime. 11/27/17  Yes Derrill Center, NP  ARIPiprazole (ABILIFY) 20 MG tablet Take 1 tablet (20 mg total) by mouth at bedtime. 09/11/15 08/16/17  Patrecia Pour, NP  hydrochlorothiazide (HYDRODIURIL) 25 MG tablet Take 1 tablet (25 mg total) by mouth daily. 11/30/17   Daleen Bo, MD  potassium chloride (K-DUR) 10 MEQ tablet Take 1 tablet (10 mEq total) by mouth daily. 11/30/17   Daleen Bo, MD    Family History Family History  Problem Relation Age of Onset  . Depression Mother   . Gout Mother   . Cancer  Father        prostate  . Other Father        lung issue  . Alcoholism Other   . Heart attack Paternal Grandfather   . Heart attack Paternal Grandmother   . Heart attack Maternal Grandmother   . Heart attack Maternal Grandfather   . Depression Son   . Anxiety disorder Son     Social History Social History   Tobacco Use  . Smoking status: Former Smoker    Types: Cigarettes  . Smokeless tobacco: Never Used  Substance Use Topics  . Alcohol use: No  . Drug use: No     Allergies   Haldol [haloperidol decanoate]; Penicillins; Pollen extract; and Shrimp [shellfish allergy]   Review of Systems Review of Systems  All other systems reviewed and are negative.    Physical Exam Updated Vital Signs BP 124/82 (BP Location: Left Arm)   Pulse 85   Temp 98.2 F (36.8 C) (Oral)   Resp 16   SpO2 100%   Physical Exam  Constitutional: She is oriented to  person, place, and time. She appears well-developed and well-nourished. No distress.  HENT:  Head: Normocephalic and atraumatic.  Right Ear: External ear normal.  Left Ear: External ear normal.  Eyes: Pupils are equal, round, and reactive to light. Conjunctivae and EOM are normal.  Neck: Normal range of motion and phonation normal. Neck supple.  Cardiovascular: Normal rate.  Pulmonary/Chest: Effort normal. She exhibits no bony tenderness.  Musculoskeletal: Normal range of motion. She exhibits edema (Bilateral lower leg edema, 2-3+.). She exhibits no tenderness or deformity.  Neurological: She is alert and oriented to person, place, and time. No cranial nerve deficit or sensory deficit. She exhibits normal muscle tone. Coordination normal.  Skin: Skin is warm, dry and intact.  Psychiatric: She has a normal mood and affect. Her behavior is normal. Judgment and thought content normal.  Nursing note and vitals reviewed.    ED Treatments / Results  Labs (all labs ordered are listed, but only abnormal results are displayed) Labs Reviewed - No data to display  EKG None  Radiology No results found.  Procedures Procedures (including critical care time)  Medications Ordered in ED Medications - No data to display   Initial Impression / Assessment and Plan / ED Course  I have reviewed the triage vital signs and the nursing notes.  Pertinent labs & imaging results that were available during my care of the patient were reviewed by me and considered in my medical decision making (see chart for details).      Patient Vitals for the past 24 hrs:  BP Temp Temp src Pulse Resp SpO2  11/30/17 0426 124/82 98.2 F (36.8 C) Oral 85 16 100 %  11/30/17 0334 - - - - - 100 %    8:59 AM Reevaluation with update and discussion. After initial assessment and treatment, an updated evaluation reveals no change in clinical status, findings discussed with patient and all questions answered. [place] decision making-nonspecific lower leg edema, likely related to prazosin, initiated for hypertension control.  Blood pressure controlled here at this time.  Will change medication to hydrochlorothiazide, for blood pressure.  Recent potassium level, 3.9.  Nursing Notes Reviewed/ Care Coordinated Applicable Imaging Reviewed Interpretation of Laboratory Data incorporated into ED treatment  The patient appears reasonably screened and/or stabilized for discharge and I doubt any other medical condition or other [facility] requiring further screening, evaluation, or treatment in the  ED at this time prior to discharge.  Plan: Home Medications-continue usual except prazosin; Home Treatments-rest, low-salt diet; return here if the recommended treatment, does not improve the symptoms; Recommended follow up-PCP follow-up 1-2 weeks for reevaluation and check on chemistry panel.      Final Clinical Impressions(s) / ED Diagnoses   Final diagnoses:  Peripheral edema  Hypertension, unspecified type    ED Discharge Orders        Ordered    hydrochlorothiazide (HYDRODIURIL) 25 MG tablet  Daily     11/30/17 0903    potassium chloride (K-DUR) 10 MEQ tablet  Daily     11/30/17 0903       Daleen Bo, MD 12/02/17 Vernelle Emerald

## 2017-12-19 ENCOUNTER — Encounter (HOSPITAL_COMMUNITY): Payer: Self-pay | Admitting: Behavioral Health
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
## 2018-01-21 ENCOUNTER — Emergency Department (HOSPITAL_COMMUNITY)
Admission: EM | Admit: 2018-01-21 | Discharge: 2018-01-22 | Disposition: A | Discharge status: Other Acute Inpatient Hospital | Payer: Medicare HMO | Attending: Emergency Medicine | Admitting: Emergency Medicine

## 2018-01-21 ENCOUNTER — Other Ambulatory Visit: Payer: Self-pay

## 2018-01-21 ENCOUNTER — Encounter (HOSPITAL_COMMUNITY): Payer: Self-pay | Admitting: *Deleted

## 2018-01-21 DIAGNOSIS — Z818 Family history of other mental and behavioral disorders: Secondary | ICD-10-CM | POA: Diagnosis not present

## 2018-01-21 DIAGNOSIS — I10 Essential (primary) hypertension: Secondary | ICD-10-CM | POA: Diagnosis not present

## 2018-01-21 DIAGNOSIS — Z9119 Patient's noncompliance with other medical treatment and regimen: Secondary | ICD-10-CM | POA: Insufficient documentation

## 2018-01-21 DIAGNOSIS — F250 Schizoaffective disorder, bipolar type: Secondary | ICD-10-CM | POA: Diagnosis present

## 2018-01-21 DIAGNOSIS — Z853 Personal history of malignant neoplasm of breast: Secondary | ICD-10-CM | POA: Diagnosis not present

## 2018-01-21 DIAGNOSIS — J45909 Unspecified asthma, uncomplicated: Secondary | ICD-10-CM | POA: Insufficient documentation

## 2018-01-21 DIAGNOSIS — Z79899 Other long term (current) drug therapy: Secondary | ICD-10-CM | POA: Insufficient documentation

## 2018-01-21 DIAGNOSIS — Z811 Family history of alcohol abuse and dependence: Secondary | ICD-10-CM | POA: Diagnosis not present

## 2018-01-21 DIAGNOSIS — Z87891 Personal history of nicotine dependence: Secondary | ICD-10-CM | POA: Diagnosis not present

## 2018-01-21 DIAGNOSIS — Z008 Encounter for other general examination: Secondary | ICD-10-CM | POA: Diagnosis present

## 2018-01-21 LAB — COMPREHENSIVE METABOLIC PANEL
ALBUMIN: 4 g/dL (ref 3.5–5.0)
ALK PHOS: 42 U/L (ref 38–126)
ALT: 16 U/L (ref 14–54)
AST: 16 U/L (ref 15–41)
Anion gap: 9 (ref 5–15)
BILIRUBIN TOTAL: 0.5 mg/dL (ref 0.3–1.2)
BUN: 15 mg/dL (ref 6–20)
CALCIUM: 9.1 mg/dL (ref 8.9–10.3)
CO2: 23 mmol/L (ref 22–32)
Chloride: 111 mmol/L (ref 101–111)
Creatinine, Ser: 0.81 mg/dL (ref 0.44–1.00)
GLUCOSE: 87 mg/dL (ref 65–99)
Potassium: 3.7 mmol/L (ref 3.5–5.1)
Sodium: 143 mmol/L (ref 135–145)
TOTAL PROTEIN: 7.1 g/dL (ref 6.5–8.1)

## 2018-01-21 LAB — RAPID URINE DRUG SCREEN, HOSP PERFORMED
AMPHETAMINES: NOT DETECTED
BENZODIAZEPINES: NOT DETECTED
Barbiturates: NOT DETECTED
COCAINE: NOT DETECTED
OPIATES: NOT DETECTED
TETRAHYDROCANNABINOL: NOT DETECTED

## 2018-01-21 LAB — SALICYLATE LEVEL: Salicylate Lvl: <7 mg/dL (ref 2.8–30.0)

## 2018-01-21 LAB — ETHANOL

## 2018-01-21 LAB — ACETAMINOPHEN LEVEL

## 2018-01-21 LAB — HCG, QUANTITATIVE, PREGNANCY: hCG, Beta Chain, Quant, S: <1 m[IU]/mL (ref <5)

## 2018-01-21 MED ORDER — ARIPIPRAZOLE 10 MG PO TABS
20.0000 mg | ORAL_TABLET | Freq: Every day | ORAL | Status: DC
  Administered 2018-01-21: 20 mg via ORAL
  Filled 2018-01-21: qty 2

## 2018-01-21 MED ORDER — MOMETASONE FURO-FORMOTEROL FUM 200-5 MCG/ACT IN AERO
2.0000 | INHALATION_SPRAY | Freq: Two times a day (BID) | RESPIRATORY_TRACT | Status: DC
  Administered 2018-01-21 – 2018-01-22 (×2): 2 via RESPIRATORY_TRACT
  Filled 2018-01-21: qty 8.8

## 2018-01-21 MED ORDER — TRAZODONE HCL 100 MG PO TABS
100.0000 mg | ORAL_TABLET | Freq: Every day | ORAL | Status: DC
  Administered 2018-01-21: 100 mg via ORAL
  Filled 2018-01-21: qty 1

## 2018-01-21 MED ORDER — DIVALPROEX SODIUM ER 500 MG PO TB24
500.0000 mg | ORAL_TABLET | Freq: Every day | ORAL | Status: DC
  Administered 2018-01-21 – 2018-01-22 (×2): 500 mg via ORAL
  Filled 2018-01-21 (×2): qty 1

## 2018-01-21 MED ORDER — MONTELUKAST SODIUM 10 MG PO TABS
10.0000 mg | ORAL_TABLET | Freq: Every day | ORAL | Status: DC
  Administered 2018-01-21: 10 mg via ORAL
  Filled 2018-01-21: qty 1

## 2018-01-21 MED ORDER — HYDROCHLOROTHIAZIDE 25 MG PO TABS
25.0000 mg | ORAL_TABLET | Freq: Every day | ORAL | Status: DC
  Administered 2018-01-22: 25 mg via ORAL
  Filled 2018-01-21: qty 1

## 2018-01-21 MED ORDER — BENZTROPINE MESYLATE 1 MG PO TABS: 1.0000 mg | ORAL_TABLET | Freq: Two times a day (BID) | ORAL | Status: DC | PRN

## 2018-01-21 MED ORDER — ALBUTEROL SULFATE HFA 108 (90 BASE) MCG/ACT IN AERS: 2.0000 | INHALATION_SPRAY | Freq: Four times a day (QID) | RESPIRATORY_TRACT | Status: DC | PRN

## 2018-01-21 NOTE — ED Notes (Signed)
[name] was observed speaking to into call bell control as if it was a telephone while the TV and call bell were not activated.

## 2018-01-21 NOTE — ED Provider Notes (Signed)
[place] DEPT Provider Note   CSN: [id number] Arrival date & time: 01/21/18  1655     History   Chief Complaint Chief Complaint  Patient presents with  . Medical Clearance    HPI [name] is a 47 y.o. female with a history of schizophrenic disorder, bipolar 1 disorder, diabetes, depression, hypertension & anxiety who presents emergency department today for medical clearance.  Patient states that she has recently been off all of her medications.  She reports that she believes her neighbor thinks she is an informant for the government.  She believes that people are watching her and everyone is doing drugs around her.  She states she has not been able to sleep for several nights.  She denies any suicidal ideation or homicidal ideation.  She denies any physical complaints at this time.  She notes alcohol use 2 weeks ago but nothing recently.  No illicit drug use.  No physical complaints at this time.  HPI  Past Medical History:  Diagnosis Date  . Anemia   . Anxiety   . Asthma   . Bipolar 1 disorder ([place])   . Breast cancer ([place])   . Depression   . Diabetes mellitus without complication ([place])   . Hypertension   . Insomnia, persistent   . Schizophrenic disorder ([place])   . Seizures [facility])     Patient Active Problem List   Diagnosis Date Noted  . Noncompliance with treatment 07/16/2015  . Schizoaffective disorder, bipolar type ([place]) 01/11/2015    Past Surgical History:  Procedure Laterality Date  . BREAST SURGERY Left   . TONSILLECTOMY       OB History    Gravida  2   Para  2   Term  2   Preterm      AB      Living  2     SAB      TAB      Ectopic      Multiple      Live Births  2            Home Medications    Prior to Admission medications   Medication Sig Start Date End Date Taking? Authorizing Provider  albuterol (PROVENTIL HFA;VENTOLIN HFA) 108 (90 Base) MCG/ACT inhaler Inhale 2 puffs into the lungs every  6 (six) hours as needed for wheezing or shortness of breath.   Yes [provider]  ARIPiprazole (ABILIFY) 20 MG tablet Take 1 tablet (20 mg total) by mouth at bedtime. 11/27/17 11/02/19 Yes Derrill Center, NP  ARIPiprazole (ABILIFY) 20 MG tablet Take 1 tablet (20 mg total) by mouth at bedtime. 09/11/15 08/16/17  Patrecia Pour, NP  benztropine (COGENTIN) 1 MG tablet Take 1 tablet (1 mg total) by mouth 2 (two) times daily as needed (EPS). 11/27/17   Derrill Center, NP  budesonide-formoterol (SYMBICORT) 160-4.5 MCG/ACT inhaler Inhale 2 puffs into the lungs 2 (two) times daily. For Asthma 01/20/15   Rankin, Shuvon B, NP  divalproex (DEPAKOTE ER) 500 MG 24 hr tablet Take 1 tablet (500 mg total) by mouth 2 (two) times daily. 11/27/17 01/08/20  Derrill Center, NP  hydrochlorothiazide (HYDRODIURIL) 25 MG tablet Take 1 tablet (25 mg total) by mouth daily. 11/30/17   Daleen Bo, MD  montelukast (SINGULAIR) 10 MG tablet Take 10 mg by mouth at bedtime.    [provider]  potassium chloride (K-DUR) 10 MEQ tablet Take 1 tablet (10 mEq total) by mouth  daily. 11/30/17   Daleen Bo, MD  traZODone (DESYREL) 100 MG tablet Take 1 tablet (100 mg total) by mouth at bedtime. 11/27/17   Derrill Center, NP    Family History Family History  Problem Relation Age of Onset  . Depression Mother   . Gout Mother   . Cancer Father        prostate  . Other Father        lung issue  . Alcoholism Other   . Heart attack Paternal Grandfather   . Heart attack Paternal Grandmother   . Heart attack Maternal Grandmother   . Heart attack Maternal Grandfather   . Depression Son   . Anxiety disorder Son     Social History Social History   Tobacco Use  . Smoking status: Former Smoker    Types: Cigarettes  . Smokeless tobacco: Never Used  Substance Use Topics  . Alcohol use: No  . Drug use: No     Allergies   Haldol [haloperidol decanoate]; Penicillins; Pollen extract; and Shrimp [shellfish  allergy]   Review of Systems Review of Systems  All other systems reviewed and are negative.    Physical Exam Updated Vital Signs BP 135/88 (BP Location: Left Arm)   Pulse 84   Temp 98 F (36.7 C) (Oral)   SpO2 99%   Physical Exam  Constitutional: She appears well-developed and well-nourished.  HENT:  Head: Normocephalic and atraumatic.  Right Ear: External ear normal.  Left Ear: External ear normal.  Nose: Nose normal.  Mouth/Throat: Uvula is midline, oropharynx is clear and moist and mucous membranes are normal. No tonsillar exudate.  Eyes: Pupils are equal, round, and reactive to light. Right eye exhibits no discharge. Left eye exhibits no discharge. No scleral icterus.  Neck: Trachea normal. Neck supple. No spinous process tenderness present. No neck rigidity. Normal range of motion present.  Cardiovascular: Normal rate, regular rhythm and intact distal pulses.  No murmur heard. Pulses:      Radial pulses are 2+ on the right side, and 2+ on the left side.       Dorsalis pedis pulses are 2+ on the right side, and 2+ on the left side.       Posterior tibial pulses are 2+ on the right side, and 2+ on the left side.  No lower extremity swelling or edema. Calves symmetric in size bilaterally.  Pulmonary/Chest: Effort normal and breath sounds normal. She exhibits no tenderness.  Abdominal: Soft. Bowel sounds are normal. There is no tenderness. There is no rebound and no guarding.  Musculoskeletal: She exhibits no edema.  Lymphadenopathy:    She has no cervical adenopathy.  Neurological: She is alert.  Speech clear. Follows commands. No facial droop. PERRLA. EOM grossly intact. CN III-XII grossly intact. Grossly moves all extremities 4 without ataxia. Able and appropriate strength for age to upper and lower extremities bilaterally.   Skin: Skin is warm and dry. No rash noted. She is not diaphoretic.  Psychiatric: Her mood appears anxious. Her speech is rapid and/or pressured.   Nursing note and vitals reviewed.    ED Treatments / Results  Labs (all labs ordered are listed, but only abnormal results are displayed) Labs Reviewed  ACETAMINOPHEN LEVEL - Abnormal; Notable for the following components:      Result Value   Acetaminophen (Tylenol), Serum <10 (*)    All other components within normal limits  HCG, QUANTITATIVE, PREGNANCY  SALICYLATE LEVEL  RAPID URINE DRUG SCREEN, HOSP PERFORMED  COMPREHENSIVE METABOLIC PANEL  ETHANOL  CBC  CBC    EKG None  Radiology No results found.  Procedures Procedures (including critical care time)  Medications Ordered in ED Medications  albuterol (PROVENTIL HFA;VENTOLIN HFA) 108 (90 Base) MCG/ACT inhaler 2 puff (has no administration in time range)  ARIPiprazole (ABILIFY) tablet 20 mg (has no administration in time range)  benztropine (COGENTIN) tablet 1 mg (has no administration in time range)  mometasone-formoterol (DULERA) 200-5 MCG/ACT inhaler 2 puff (has no administration in time range)  divalproex (DEPAKOTE ER) 24 hr tablet 500 mg (500 mg Oral Given 01/21/18 2025)  hydrochlorothiazide (HYDRODIURIL) tablet 25 mg (has no administration in time range)  montelukast (SINGULAIR) tablet 10 mg (has no administration in time range)  traZODone (DESYREL) tablet 100 mg (has no administration in time range)     Initial Impression / Assessment and Plan / ED Course  I have reviewed the triage vital signs and the nursing notes.  Pertinent labs & imaging results that were available during my care of the patient were reviewed by me and considered in my medical decision making (see chart for details).     47 year old female with a history of bipolar and schizophrenia disorder presenting to the emergency department today with rapid, pressured speech, not sleeping in several days and belief that she is being watched by others and her neighbor thinks she is an informant for the government. No SI/HI. She has been off her  medications recently. No illicit drug use or alcohol. No physical complaints at this time.  Labs resulted are reassuring.  Patient is medically cleared.  Will be placed in psych hold.  Home medications reordered.  TTS consulted.   Final Clinical Impressions(s) / ED Diagnoses   Final diagnoses:  Encounter for medical clearance for patient hold    ED Discharge Orders    None       Lorelle Gibbs 01/21/18 2239    Tegeler, Gwenyth Allegra, MD 01/22/18 [phone]
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
## 2018-01-21 NOTE — ED Triage Notes (Signed)
Patient reports "feeling funny" in the last three months. Patient reports that "nothing makes sense". Patient report she lives by herself. Patient denies suicidal ideation or plan at this time.

## 2018-01-22 ENCOUNTER — Encounter (HOSPITAL_COMMUNITY): Payer: Self-pay

## 2018-01-22 ENCOUNTER — Inpatient Hospital Stay (HOSPITAL_COMMUNITY)
Admission: AD | Admit: 2018-01-22 | Discharge: 2018-01-30 | DRG: 885 | Disposition: A | Discharge status: Home / Self Care | Payer: Medicare HMO | Source: Intra-hospital | Attending: Psychiatry | Admitting: Psychiatry

## 2018-01-22 ENCOUNTER — Inpatient Hospital Stay: Admission: AD | Admit: 2018-01-22 | Payer: Self-pay | Source: Intra-hospital | Admitting: Psychiatry

## 2018-01-22 DIAGNOSIS — Z79891 Long term (current) use of opiate analgesic: Secondary | ICD-10-CM

## 2018-01-22 DIAGNOSIS — Z811 Family history of alcohol abuse and dependence: Secondary | ICD-10-CM | POA: Diagnosis not present

## 2018-01-22 DIAGNOSIS — Z915 Personal history of self-harm: Secondary | ICD-10-CM

## 2018-01-22 DIAGNOSIS — Z87891 Personal history of nicotine dependence: Secondary | ICD-10-CM | POA: Diagnosis not present

## 2018-01-22 DIAGNOSIS — Z853 Personal history of malignant neoplasm of breast: Secondary | ICD-10-CM | POA: Diagnosis not present

## 2018-01-22 DIAGNOSIS — Z91013 Allergy to seafood: Secondary | ICD-10-CM | POA: Diagnosis not present

## 2018-01-22 DIAGNOSIS — Z818 Family history of other mental and behavioral disorders: Secondary | ICD-10-CM

## 2018-01-22 DIAGNOSIS — R451 Restlessness and agitation: Secondary | ICD-10-CM | POA: Diagnosis not present

## 2018-01-22 DIAGNOSIS — Z880 Allergy status to penicillin: Secondary | ICD-10-CM | POA: Diagnosis not present

## 2018-01-22 DIAGNOSIS — R45851 Suicidal ideations: Secondary | ICD-10-CM | POA: Diagnosis present

## 2018-01-22 DIAGNOSIS — E119 Type 2 diabetes mellitus without complications: Secondary | ICD-10-CM | POA: Diagnosis present

## 2018-01-22 DIAGNOSIS — Z008 Encounter for other general examination: Secondary | ICD-10-CM | POA: Diagnosis not present

## 2018-01-22 DIAGNOSIS — Y636 Underdosing and nonadministration of necessary drug, medicament or biological substance: Secondary | ICD-10-CM | POA: Diagnosis present

## 2018-01-22 DIAGNOSIS — F250 Schizoaffective disorder, bipolar type: Principal | ICD-10-CM | POA: Diagnosis present

## 2018-01-22 DIAGNOSIS — J449 Chronic obstructive pulmonary disease, unspecified: Secondary | ICD-10-CM | POA: Diagnosis present

## 2018-01-22 DIAGNOSIS — Z9114 Patient's other noncompliance with medication regimen: Secondary | ICD-10-CM | POA: Diagnosis not present

## 2018-01-22 DIAGNOSIS — Z79899 Other long term (current) drug therapy: Secondary | ICD-10-CM | POA: Diagnosis not present

## 2018-01-22 DIAGNOSIS — Z8249 Family history of ischemic heart disease and other diseases of the circulatory system: Secondary | ICD-10-CM

## 2018-01-22 DIAGNOSIS — Z560 Unemployment, unspecified: Secondary | ICD-10-CM

## 2018-01-22 DIAGNOSIS — G4709 Other insomnia: Secondary | ICD-10-CM | POA: Diagnosis present

## 2018-01-22 DIAGNOSIS — F419 Anxiety disorder, unspecified: Secondary | ICD-10-CM | POA: Diagnosis present

## 2018-01-22 DIAGNOSIS — G4700 Insomnia, unspecified: Secondary | ICD-10-CM

## 2018-01-22 DIAGNOSIS — Z7951 Long term (current) use of inhaled steroids: Secondary | ICD-10-CM

## 2018-01-22 DIAGNOSIS — G259 Extrapyramidal and movement disorder, unspecified: Secondary | ICD-10-CM | POA: Diagnosis not present

## 2018-01-22 DIAGNOSIS — Z888 Allergy status to other drugs, medicaments and biological substances status: Secondary | ICD-10-CM

## 2018-01-22 DIAGNOSIS — I10 Essential (primary) hypertension: Secondary | ICD-10-CM | POA: Diagnosis present

## 2018-01-22 LAB — CBC
HEMATOCRIT: 35.9 % — AB (ref 36.0–46.0)
HEMOGLOBIN: 12.1 g/dL (ref 12.0–15.0)
MCH: 30.6 pg (ref 26.0–34.0)
MCHC: 33.7 g/dL (ref 30.0–36.0)
MCV: 90.9 fL (ref 78.0–100.0)
Platelets: 257 10*3/uL (ref 150–400)
RBC: 3.95 MIL/uL (ref 3.87–5.11)
RDW: 13 % (ref 11.5–15.5)
WBC: 7.2 10*3/uL (ref 4.0–10.5)

## 2018-01-22 MED ORDER — BENZTROPINE MESYLATE 1 MG PO TABS
1.0000 mg | ORAL_TABLET | Freq: Two times a day (BID) | ORAL | Status: DC | PRN
  Filled 2018-01-22: qty 14

## 2018-01-22 MED ORDER — HYDROCHLOROTHIAZIDE 25 MG PO TABS
25.0000 mg | ORAL_TABLET | Freq: Every day | ORAL | Status: DC
  Administered 2018-01-23 – 2018-01-30 (×8): 25 mg via ORAL
  Filled 2018-01-22 (×10): qty 1

## 2018-01-22 MED ORDER — MOMETASONE FURO-FORMOTEROL FUM 200-5 MCG/ACT IN AERO
2.0000 | INHALATION_SPRAY | Freq: Two times a day (BID) | RESPIRATORY_TRACT | Status: DC
  Administered 2018-01-22 – 2018-01-30 (×16): 2 via RESPIRATORY_TRACT
  Filled 2018-01-22 (×2): qty 8.8

## 2018-01-22 MED ORDER — MAGNESIUM HYDROXIDE 400 MG/5ML PO SUSP: 30.0000 mL | Freq: Every day | ORAL | Status: DC | PRN

## 2018-01-22 MED ORDER — ALUM & MAG HYDROXIDE-SIMETH 200-200-20 MG/5ML PO SUSP: 30.0000 mL | ORAL | Status: DC | PRN

## 2018-01-22 MED ORDER — HYDROXYZINE HCL 25 MG PO TABS
25.0000 mg | ORAL_TABLET | Freq: Three times a day (TID) | ORAL | Status: DC | PRN
  Administered 2018-01-22 – 2018-01-28 (×8): 25 mg via ORAL
  Filled 2018-01-22 (×9): qty 1

## 2018-01-22 MED ORDER — ACETAMINOPHEN 325 MG PO TABS
650.0000 mg | ORAL_TABLET | Freq: Four times a day (QID) | ORAL | Status: DC | PRN
  Administered 2018-01-28: 650 mg via ORAL
  Filled 2018-01-22: qty 2

## 2018-01-22 MED ORDER — ALBUTEROL SULFATE HFA 108 (90 BASE) MCG/ACT IN AERS: 2.0000 | INHALATION_SPRAY | Freq: Four times a day (QID) | RESPIRATORY_TRACT | Status: DC | PRN

## 2018-01-22 MED ORDER — MONTELUKAST SODIUM 10 MG PO TABS
10.0000 mg | ORAL_TABLET | Freq: Every day | ORAL | Status: DC
  Administered 2018-01-22 – 2018-01-29 (×8): 10 mg via ORAL
  Filled 2018-01-22 (×10): qty 1

## 2018-01-22 MED ORDER — ZIPRASIDONE MESYLATE 20 MG IM SOLR: 20.0000 mg | Freq: Two times a day (BID) | INTRAMUSCULAR | Status: DC | PRN

## 2018-01-22 MED ORDER — ARIPIPRAZOLE 10 MG PO TABS
20.0000 mg | ORAL_TABLET | Freq: Every day | ORAL | Status: DC
  Administered 2018-01-22 – 2018-01-29 (×8): 20 mg via ORAL
  Filled 2018-01-22 (×10): qty 2

## 2018-01-22 MED ORDER — LORAZEPAM 1 MG PO TABS
1.0000 mg | ORAL_TABLET | ORAL | Status: AC | PRN
  Administered 2018-01-24: 1 mg via ORAL
  Filled 2018-01-22: qty 1

## 2018-01-22 MED ORDER — TRAZODONE HCL 50 MG PO TABS: 50.0000 mg | ORAL_TABLET | Freq: Every evening | ORAL | Status: DC | PRN

## 2018-01-22 MED ORDER — TRAZODONE HCL 100 MG PO TABS
100.0000 mg | ORAL_TABLET | Freq: Every day | ORAL | Status: DC
  Administered 2018-01-22 – 2018-01-29 (×8): 100 mg via ORAL
  Filled 2018-01-22 (×10): qty 1

## 2018-01-22 MED ORDER — DIVALPROEX SODIUM ER 500 MG PO TB24
500.0000 mg | ORAL_TABLET | Freq: Every day | ORAL | Status: DC
  Administered 2018-01-23: 500 mg via ORAL
  Filled 2018-01-22 (×3): qty 1

## 2018-01-22 NOTE — Consult Note (Addendum)
[facility] Face-to-Face Psychiatry Consult   Reason for Consult:  Bizarre behavior Referring Physician:  EDP Patient Identification: [name] MRN:  [id number] Principal Diagnosis: Schizoaffective disorder, bipolar type [facility]) Diagnosis:   Patient Active Problem List   Diagnosis Date Noted  . Encounter for medical clearance for patient hold [Z00.8]   . Noncompliance with treatment [Z91.19] 07/16/2015  . Schizoaffective disorder, bipolar type ([place]) [F25.0] 01/11/2015    Total Time spent with patient: 45 minutes  Subjective:   [name] is a 47 y.o. female patient admitted with bizarre behavior.  HPI:  Pt was seen and chart reviewed with treatment team and Dr Mariea Clonts. Pt denies suicidal/homicidal ideation, denies auditory/visual hallucinations and does not appear to be responding to internal stimuli. Pt has been off of her medications recently and today presents as tangential, disorganized, with pressured speech and flight of ideas. Pt' UDS and BAL are negative. Pt states she lives alone and her neighbor thinks she is an informant or the police. Pt would benefit from an inpatient psychiatric hospitalization for crisis stabilization and medication management.   Past Psychiatric History: Schizoaffective disorder   Risk to Self: Suicidal Ideation: No Suicidal Intent: No Is patient at risk for suicide?: No Suicidal Plan?: No Access to Means: No What has been your use of drugs/alcohol within the last 12 months?: Denies How many times?: 1 Other Self Harm Risks: No Triggers for Past Attempts: Unpredictable Intentional Self Injurious Behavior: None Risk to Others: Homicidal Ideation: No Thoughts of Harm to Others: No Current Homicidal Intent: No Current Homicidal Plan: No Access to Homicidal Means: No Identified Victim: No one History of harm to others?: No Assessment of Violence: None Noted Violent Behavior Description: None reported Does patient have access to weapons?: No Criminal  Charges Pending?: No Does patient have a court date: No Prior Inpatient Therapy: Prior Inpatient Therapy: Yes Prior Therapy Dates: 11/2017 Prior Therapy Facilty/Provider(s): [facility] Reason for Treatment: depression, bipolar Prior Outpatient Therapy: Prior Outpatient Therapy: Yes Prior Therapy Dates: Sept 2018 Prior Therapy Facilty/Provider(s): [facility] Reason for Treatment: med managment Does patient have an ACCT team?: No Does patient have Intensive In-House Services?  : No Does patient have [facility] services? : No Does patient have P4CC services?: No  Past Medical History:  Past Medical History:  Diagnosis Date  . Anemia   . Anxiety   . Asthma   . Bipolar 1 disorder ([place])   . Breast cancer ([place])   . Depression   . Diabetes mellitus without complication ([place])   . Hypertension   . Insomnia, persistent   . Schizophrenic disorder ([place])   . Seizures ([place])     Past Surgical History:  Procedure Laterality Date  . BREAST SURGERY Left   . TONSILLECTOMY     Family History:  Family History  Problem Relation Age of Onset  . Depression Mother   . Gout Mother   . Cancer Father        prostate  . Other Father        lung issue  . Alcoholism Other   . Heart attack Paternal Grandfather   . Heart attack Paternal Grandmother   . Heart attack Maternal Grandmother   . Heart attack Maternal Grandfather   . Depression Son   . Anxiety disorder Son    Family Psychiatric  History: As stated above.  Social History:  Social History   Substance and Sexual Activity  Alcohol Use No     Social History   Substance and  Sexual Activity  Drug Use No    Social History   Socioeconomic History  . Marital status: Single    Spouse name: Not on file  . Number of children: Not on file  . Years of education: Not on file  . Highest education level: Not on file  Occupational History  . Not on file  Social Needs  . Financial resource strain: Not on file  . Food insecurity:     Worry: Not on file    Inability: Not on file  . Transportation needs:    Medical: Not on file    Non-medical: Not on file  Tobacco Use  . Smoking status: Former Smoker    Types: Cigarettes  . Smokeless tobacco: Never Used  Substance and Sexual Activity  . Alcohol use: No  . Drug use: No  . Sexual activity: Never    Birth control/protection: Pill  Lifestyle  . Physical activity:    Days per week: Not on file    Minutes per session: Not on file  . Stress: Not on file  Relationships  . Social connections:    Talks on phone: Not on file    Gets together: Not on file    Attends religious service: Not on file    Active member of club or organization: Not on file    Attends meetings of clubs or organizations: Not on file    Relationship status: Not on file  Other Topics Concern  . Not on file  Social History Narrative  . Not on file   Additional Social History: N/A    Allergies:   Allergies  Allergen Reactions  . Haldol [Haloperidol Decanoate] Other (See Comments)    Stiffness, eyes bulging  . Penicillins Nausea And Vomiting    Has patient had a PCN reaction causing immediate rash, facial/tongue/throat swelling, SOB or lightheadedness with hypotension:UNSURE  Has patient had a PCN reaction causing severe rash involving mucus membranes or skin necrosis: UNSURE Has patient had a PCN reaction that required hospitalization:UNSURE Has patient had a PCN reaction occurring within the last 10 years:No If all of the above answers are "NO", then may proceed with Cephalosporin use. CHILDHOOD REACTION  . Pollen Extract   . Shrimp [Shellfish Allergy] Rash    Labs:  Results for orders placed or performed during the hospital encounter of 01/21/18 (from the past 48 hour(s))  hCG, quantitative, pregnancy     Status: None   Collection Time: 01/21/18  5:40 PM  Result Value Ref Range   hCG, Beta Chain, Quant, S <1 <5 mIU/mL    Comment:          GEST. AGE      CONC.  (mIU/mL)   <=1  WEEK        5 - 50     2 WEEKS       50 - 500     3 WEEKS       100 - 10,000     4 WEEKS     1,000 - 30,000     5 WEEKS     3,500 - 115,000   6-8 WEEKS     12,000 - 270,000    12 WEEKS     15,000 - 220,000        FEMALE AND NON-PREGNANT FEMALE:     LESS THAN 5 mIU/mL Performed at [facility], [place] [address], [place], [place] [address]   Salicylate level     Status: None  Collection Time: 01/21/18  5:40 PM  Result Value Ref Range   Salicylate Lvl <0.0 2.8 - 30.0 mg/dL    Comment: Performed at [facility], [place] [address], [place], [place] [address]  Acetaminophen level     Status: Abnormal   Collection Time: 01/21/18  5:40 PM  Result Value Ref Range   Acetaminophen (Tylenol), Serum <10 (L) 10 - 30 ug/mL    Comment: (NOTE) Therapeutic concentrations vary significantly. A range of 10-30 ug/mL  may be an effective concentration for many patients. However, some  are best treated at concentrations outside of this range. Acetaminophen concentrations >150 ug/mL at 4 hours after ingestion  and >50 ug/mL at 12 hours after ingestion are often associated with  toxic reactions. Performed at [facility], [place] [address] [place], [place] [address]   Comprehensive metabolic panel     Status: None   Collection Time: 01/21/18  5:40 PM  Result Value Ref Range   Sodium 143 135 - 145 mmol/L   Potassium 3.7 3.5 - 5.1 mmol/L   Chloride 111 101 - 111 mmol/L   CO2 23 22 - 32 mmol/L   Glucose, Bld 87 65 - 99 mg/dL   BUN 15 6 - 20 mg/dL   Creatinine, Ser 0.81 0.44 - 1.00 mg/dL   Calcium 9.1 8.9 - 10.3 mg/dL   Total Protein 7.1 6.5 - 8.1 g/dL   Albumin 4.0 3.5 - 5.0 g/dL   AST 16 15 - 41 U/L   ALT 16 14 - 54 U/L   Alkaline Phosphatase 42 38 - 126 U/L   Total Bilirubin 0.5 0.3 - 1.2 mg/dL   GFR calc non Af Amer >60 >60 mL/min   GFR calc Af Amer >60 >60 mL/min    Comment: (NOTE) The eGFR has been calculated using the CKD EPI  equation. This calculation has not been validated in all clinical situations. eGFR's persistently <60 mL/min signify possible Chronic Kidney Disease.    Anion gap 9 5 - 15    Comment: Performed at [facility], [place] [address], [place], [place] [address]  Ethanol     Status: None   Collection Time: 01/21/18  5:40 PM  Result Value Ref Range   Alcohol, Ethyl (B) <10 <10 mg/dL    Comment: (NOTE) Lowest detectable limit for serum alcohol is 10 mg/dL. For medical purposes only. Performed at [facility], [place] [address], [place], [place] [address]   CBC     Status: Abnormal   Collection Time: 01/21/18  5:40 PM  Result Value Ref Range   WBC 7.2 4.0 - 10.5 K/uL    Comment: WHITE COUNT CONFIRMED ON SMEAR   RBC 3.95 3.87 - 5.11 MIL/uL   Hemoglobin 12.1 12.0 - 15.0 g/dL   HCT 35.9 (L) 36.0 - 46.0 %   MCV 90.9 78.0 - 100.0 fL   MCH 30.6 26.0 - 34.0 pg   MCHC 33.7 30.0 - 36.0 g/dL   RDW 13.0 11.5 - 15.5 %   Platelets 257 150 - 400 K/uL    Comment: Performed at [facility], [place] [address] [place], [place] [address]  Rapid urine drug screen (hospital performed)     Status: None   Collection Time: 01/21/18  6:28 PM  Result Value Ref Range   Opiates NONE DETECTED NONE DETECTED   Cocaine NONE DETECTED NONE DETECTED   Benzodiazepines NONE DETECTED NONE DETECTED   Amphetamines NONE DETECTED NONE DETECTED  Tetrahydrocannabinol NONE DETECTED NONE DETECTED   Barbiturates NONE DETECTED NONE DETECTED    Comment: (NOTE) DRUG SCREEN FOR MEDICAL PURPOSES ONLY.  IF CONFIRMATION IS NEEDED FOR ANY PURPOSE, NOTIFY LAB WITHIN 5 DAYS. LOWEST DETECTABLE LIMITS FOR URINE DRUG SCREEN Drug Class                     Cutoff (ng/mL) Amphetamine and metabolites    1000 Barbiturate and metabolites    200 Benzodiazepine                 664 Tricyclics and metabolites     300 Opiates and metabolites        300 Cocaine and metabolites        300 THC                             50 Performed at [facility], [place] [address], [place], [place] [address]     Current Facility-Administered Medications  Medication Dose Route Frequency Provider Last Rate Last Dose  . albuterol (PROVENTIL HFA;VENTOLIN HFA) 108 (90 Base) MCG/ACT inhaler 2 puff  2 puff Inhalation Q6H PRN Maczis, Barth Kirks, PA-C      . ARIPiprazole (ABILIFY) tablet 20 mg  20 mg Oral QHS Jillyn Ledger, PA-C   20 mg at 01/21/18 2313  . benztropine (COGENTIN) tablet 1 mg  1 mg Oral BID PRN Jillyn Ledger, PA-C      . divalproex (DEPAKOTE ER) 24 hr tablet 500 mg  500 mg Oral Daily Jillyn Ledger, PA-C   500 mg at 01/22/18 4259  . hydrochlorothiazide (HYDRODIURIL) tablet 25 mg  25 mg Oral Daily Jillyn Ledger, PA-C   25 mg at 01/22/18 0930  . mometasone-formoterol (DULERA) 200-5 MCG/ACT inhaler 2 puff  2 puff Inhalation BID Jillyn Ledger, PA-C   2 puff at 01/22/18 0802  . montelukast (SINGULAIR) tablet 10 mg  10 mg Oral QHS Jillyn Ledger, PA-C   10 mg at 01/21/18 2315  . traZODone (DESYREL) tablet 100 mg  100 mg Oral QHS Jillyn Ledger, PA-C   100 mg at 01/21/18 2315   Current Outpatient Medications  Medication Sig Dispense Refill  . albuterol (PROVENTIL HFA;VENTOLIN HFA) 108 (90 Base) MCG/ACT inhaler Inhale 2 puffs into the lungs every 6 (six) hours as needed for wheezing or shortness of breath.    . ARIPiprazole (ABILIFY) 20 MG tablet Take 1 tablet (20 mg total) by mouth at bedtime. 14 tablet 0  . ARIPiprazole (ABILIFY) 20 MG tablet Take 1 tablet (20 mg total) by mouth at bedtime. 30 tablet 0  . benztropine (COGENTIN) 1 MG tablet Take 1 tablet (1 mg total) by mouth 2 (two) times daily as needed (EPS). 30 tablet 0  . budesonide-formoterol (SYMBICORT) 160-4.5 MCG/ACT inhaler Inhale 2 puffs into the lungs 2 (two) times daily. For Asthma 1 Inhaler 12  . divalproex (DEPAKOTE ER) 500 MG 24 hr tablet Take 1 tablet (500 mg total) by mouth 2 (two) times  daily. 60 tablet 0  . hydrochlorothiazide (HYDRODIURIL) 25 MG tablet Take 1 tablet (25 mg total) by mouth daily. 30 tablet 0  . montelukast (SINGULAIR) 10 MG tablet Take 10 mg by mouth at bedtime.    . potassium chloride (K-DUR) 10 MEQ tablet Take 1 tablet (10 mEq total) by mouth daily. 20 tablet 0  . traZODone (DESYREL) 100 MG tablet Take 1 tablet (100  mg total) by mouth at bedtime. 30 tablet 0    Musculoskeletal: Strength & Muscle Tone: within normal limits Gait & Station: normal Patient leans: N/A  Psychiatric Specialty Exam: Physical Exam  Nursing note and vitals reviewed. Constitutional: She is oriented to person, place, and time. She appears well-developed and well-nourished.  HENT:  Head: Normocephalic.  Neck: Normal range of motion.  Respiratory: Effort normal.  Musculoskeletal: Normal range of motion.  Neurological: She is alert and oriented to person, place, and time.  Psychiatric: Her mood appears anxious. Her speech is rapid and/or pressured. She is actively hallucinating. Thought content is delusional. Cognition and memory are normal. She expresses impulsivity.    Review of Systems  Psychiatric/Behavioral: Negative for hallucinations and suicidal ideas. The patient has insomnia.   All other systems reviewed and are negative.   Blood pressure 137/83, pulse 90, temperature 98.1 F (36.7 C), temperature source Oral, resp. rate 16, SpO2 99 %.There is no height or weight on file to calculate BMI.  General Appearance: Disheveled  Eye Contact:  Good  Speech:  Pressured  Volume:  Normal  Mood:  Depressed  Affect:  Congruent and Depressed  Thought Process:  Disorganized  Orientation:  Other:  person and place  Thought Content:  Illogical, Delusions, Hallucinations: Auditory and Paranoid Ideation  Suicidal Thoughts:  No  Homicidal Thoughts:  No  Memory:  Immediate;   Fair Recent;   Fair Remote;   Fair  Judgement:  Impaired  Insight:  Lacking  Psychomotor Activity:   Normal  Concentration:  Concentration: Fair and Attention Span: Fair  Recall:  [employer] of Knowledge:  Fair  Language:  Good  Akathisia:  No  Handed:  Right  AIMS (if indicated):   N/A  Assets:  Agricultural consultant Housing Social Support  ADL's:  Intact  Cognition:  WNL  Sleep:   Poor     Treatment Plan Summary: Daily contact with patient to assess and evaluate symptoms and progress in treatment and Medication management (see MAR)  Disposition: Recommend psychiatric Inpatient admission when medically cleared. TTS to seek placement  Ethelene Hal, NP 01/22/2018 1:09 PM   Patient seen face-to-face for psychiatric evaluation, chart reviewed and case discussed with the physician extender and developed treatment plan. Reviewed the information documented and agree with the treatment plan.  Buford Dresser, DO 01/22/18 7:51 PM

## 2018-01-22 NOTE — Progress Notes (Signed)
Adult Psychoeducational Group Note  Date:  01/22/2018 Time:  8:46 PM  Group Topic/Focus:  Wrap-Up Group:   The focus of this group is to help patients review their daily goal of treatment and discuss progress on daily workbooks.  Participation Level:  Active  Participation Quality:  Appropriate  Affect:  Appropriate  Cognitive:  Appropriate  Insight: Appropriate  Engagement in Group:  Engaged  Modes of Intervention:  Discussion  Additional Comments:  The patient expressed that she recently arrived to [facility].  Nash Shearer 01/22/2018, 8:46 PM

## 2018-01-22 NOTE — BH Assessment (Signed)
[facility] Assessment Progress Note  Per Buford Dresser, DO, this pt requires psychiatric hospitalization at this time.  Leonia Reader, RN, [facility] has assigned pt to [facility] Rm [id number]; [facility] will be ready to receive pt at 15:00.  Danny Sprinkle, TS agrees to review consents with pt  Pt's nurse, Caryl Pina, has been notified, and agrees to send original paperwork along with pt via [facility], and to call report to [phone].  Jalene Mullet, [place] Coordinator [phone]

## 2018-01-22 NOTE — Plan of Care (Signed)
D: Pt denies SI/HI/AVH. Pt is pleasant and cooperative. Pt stated she was doing fine, pt visibly responding to internal stimuli.   A: Pt was offered support and encouragement. Pt was given scheduled medications. Pt was encourage to attend groups. Q 15 minute checks were done for safety.   R:Pt attends groups and interacts well with peers and staff. Pt is taking medication. Pt has no complaints.Pt receptive to treatment and safety maintained on unit.   Problem: Coping: Goal: Ability to identify and develop effective coping behavior will improve Outcome: Progressing

## 2018-01-22 NOTE — ED Notes (Signed)
[facility] transport on unit to transfer pt to [facility] Adult unit per MD order. Personal property given to [facility] transport for transfer. Pt ambulatory off unit.

## 2018-01-22 NOTE — BH Assessment (Signed)
[place] Assessment Progress Note   Per Dr. Mariea Clonts, inpatient treatment is recommended and most appropriate for a [facility] 500 hall bed.

## 2018-01-22 NOTE — Progress Notes (Signed)
Admission Note: Patient is an 47 year old female admitted to the unit with symptoms of anxiety, depression and delusional thinking.  Patient presents with paranoid ideation and disorganized thought process.  Patient states her neighbor accused her of being an informant.  Admission plan of care reviewed and consent signed.  Personal belonging and skin assessment completed.  Skin is dry and intact.  No contraband found.  Patient oriented to the unit, room and staff.  Routine safety checks initiated.  Patient is safe on the unit.

## 2018-01-22 NOTE — ED Notes (Signed)
Patient arrived to unit and is pleasant and cooperative at this time. Pt is, however, talking in a bizarre manor by repeating writer's words then laughing hysterically. Pt denies suicidal or homicidal ideations at this time. No distress noted. Pt given snacks and soda upon request.

## 2018-01-22 NOTE — Tx Team (Signed)
Initial Treatment Plan 01/22/2018 6:05 PM [name] UNG:[id number]    PATIENT STRESSORS: Financial difficulties Health problems Medication change or noncompliance   PATIENT STRENGTHS: Ability for insight Average or above average intelligence Capable of independent living Communication skills   PATIENT IDENTIFIED PROBLEMS: "relationship"  Anxiety  Depression  Medication Noncompliance               DISCHARGE CRITERIA:  Adequate post-discharge living arrangements Motivation to continue treatment in a less acute level of care  PRELIMINARY DISCHARGE PLAN: Attend aftercare/continuing care group Outpatient therapy Return to previous living arrangement  PATIENT/FAMILY INVOLVEMENT: This treatment plan has been presented to and reviewed with the patient, [name], and/or family member.  The patient and family have been given the opportunity to ask questions and make suggestions.  Vela Prose, RN 01/22/2018, 6:05 PM

## 2018-01-22 NOTE — ED Notes (Signed)
Pt taking a shower 

## 2018-01-23 DIAGNOSIS — Z818 Family history of other mental and behavioral disorders: Secondary | ICD-10-CM

## 2018-01-23 DIAGNOSIS — Z560 Unemployment, unspecified: Secondary | ICD-10-CM

## 2018-01-23 DIAGNOSIS — F250 Schizoaffective disorder, bipolar type: Principal | ICD-10-CM

## 2018-01-23 DIAGNOSIS — F419 Anxiety disorder, unspecified: Secondary | ICD-10-CM

## 2018-01-23 DIAGNOSIS — R45851 Suicidal ideations: Secondary | ICD-10-CM

## 2018-01-23 DIAGNOSIS — Z811 Family history of alcohol abuse and dependence: Secondary | ICD-10-CM

## 2018-01-23 DIAGNOSIS — G4700 Insomnia, unspecified: Secondary | ICD-10-CM

## 2018-01-23 MED ORDER — ASENAPINE MALEATE 5 MG SL SUBL
5.0000 mg | SUBLINGUAL_TABLET | Freq: Two times a day (BID) | SUBLINGUAL | Status: DC
  Administered 2018-01-23 – 2018-01-24 (×3): 5 mg via SUBLINGUAL
  Filled 2018-01-23 (×7): qty 1

## 2018-01-23 MED ORDER — DIVALPROEX SODIUM ER 500 MG PO TB24
500.0000 mg | ORAL_TABLET | Freq: Two times a day (BID) | ORAL | Status: DC
  Administered 2018-01-23 – 2018-01-30 (×14): 500 mg via ORAL
  Filled 2018-01-23 (×16): qty 1

## 2018-01-23 NOTE — Progress Notes (Signed)
Adult Psychoeducational Group Note  Date:  01/23/2018 Time:  10:03 PM  Group Topic/Focus:  Wrap-Up Group:   The focus of this group is to help patients review their daily goal of treatment and discuss progress on daily workbooks.  Participation Level:  Active  Participation Quality:  Redirectable  Affect:  Excited  Cognitive:  Hallucinating  Insight: Lacking  Engagement in Group:  Engaged  Modes of Intervention:  Socialization and Support  Additional Comments:  Patient attended and participated in group tonight. She reports that today was a 9.9. He father visited with her. She went for her meal and attended groups. She also socialized with peers in the dayroom.  Salley Scarlet [facility] 01/23/2018, 10:03 PM

## 2018-01-23 NOTE — Plan of Care (Signed)
D: Pt denies SI/HI/AVH. Pt is pleasant , cooperative, and psychotic . Pt denies AVH, but seen talking to people not there. Pt appropriate on the unit, pressured speech but re-directable.   A: Pt was offered support and encouragement. Pt was given scheduled medications. Pt was encourage to attend groups. Q 15 minute checks were done for safety.   R:Pt attends groups and interacts well with peers and staff. Pt is taking medication. Pt has no complaints.Pt receptive to treatment and safety maintained on unit.   Problem: Education: Goal: Mental status will improve Outcome: Progressing   Problem: Coping: Goal: Ability to identify and develop effective coping behavior will improve Outcome: Progressing   Problem: Activity: Goal: Interest or engagement in leisure activities will improve Outcome: Progressing

## 2018-01-23 NOTE — BHH Group Notes (Signed)
LCSW Group Therapy Note   01/23/2018 1:15pm   Type of Therapy and Topic:  Group Therapy:  Positive Affirmations   Participation Level:  Did Not Attend  Description of Group: This group addressed positive affirmation toward self and others. Patients went around the room and identified two positive things about themselves and two positive things about a peer in the room. Patients reflected on how it felt to share something positive with others, to identify positive things about themselves, and to hear positive things from others. Patients were encouraged to have a daily reflection of positive characteristics or circumstances.  Therapeutic Goals 1. Patient will verbalize two of their positive qualities 2. Patient will demonstrate empathy for others by stating two positive qualities about a peer in the group 3. Patient will verbalize their feelings when voicing positive self affirmations and when voicing positive affirmations of others 4. Patients will discuss the potential positive impact on their wellness/recovery of focusing on positive traits of self and others. Summary of Patient Progress:    Therapeutic Modalities Cognitive Behavioral Therapy Motivational Interviewing  Trish Mage, [place] 01/23/2018 3:31 PM

## 2018-01-23 NOTE — H&P (Signed)
Psychiatric Admission Assessment Adult  Patient Identification: [name] MRN:  [id number] Date of Evaluation:  01/23/2018 Chief Complaint:  schizeaffective disorder bipolar type Principal Diagnosis: Schizoaffective disorder, bipolar type ([place]) Diagnosis:   Patient Active Problem List   Diagnosis Date Noted  . Encounter for medical clearance for patient hold [Z00.8]   . Noncompliance with treatment [Z91.19] 07/16/2015  . Schizoaffective disorder, bipolar type ([place]) [F25.0] 01/11/2015   History of Present Illness:   [name] is a 47 y/o F with history of schizoaffective bipolar type who was admitted voluntarily from [place] where she presented with worsening anxiety, paranoia, delusions that her neighbor thinks she is a police informant, decreased need for sleep, symptoms of mania, and poor medication adherence (pt reports she received Abilify Maintena injection last on 01/15/18, though). Pt has recent history of discharge from [facility] on 11/[facility]/17. Pt was medically cleared in the ED, and then transferred to [facility] for additional treatment and evaluation.  Upon initial evaluation, pt is pressured in speech and tangential with her thought pattern. She is barely able to be redirected. Her mood is generally elevated, but she has flight of ideas and difficulty tracking the conversation. She often steers the conversation towards sexual topics. When asked what brought her in to the hospital, pt shares, "I P-T-S-D'eed, I was supposed to go to church, and I had a guest come over, and I said he could stay on the couch, then I went to bed, and when I woke up there was this spoon, looked like it has been used to smoke crack - all black, so I know someone had been in there. I also, my tail has been cut. It's all ragged, and I know I don't participate in any anal sex." Pt was asked about information supplied in ED regarding paranoia of her neighbors and she confirms that her neighbors made statement that they believed  pt is a police informant. Pt reports her mood has been "so-so." She denies other symptoms of depression aside from disrupted sleep and decreased appetite. She reports some vague SI, and states, "Very little, I put the knives away." She denies HI. She endorses AH of hearing "Some arabic thing hollering at me, talking real fast." She endorses VH of "seeing shadows." She is distractible during interview, she describes only needing to sleep for a few hours per night or not at all for several days, she has flight of ideas and pressured speech. Her thought pattern is hypersexual during interview. She denies symptoms of PTSD and OCD. She denies illicit substance use.  Discussed with patient about treatment options. She reports being off of her previous medications (same as those from previous discharge) of abilify (oral) and depakote ER for only one day, but in the ED she had reported being off for longer. She has already been restarted on these medications, and we will plan to obtain a depakote level in the AM. We will also start trial of saphris to address pt's symptoms of bipolar mania. Pt was in agreement with the above plan, and she had no further questions, comments, or concerns.  Associated Signs/Symptoms: Depression Symptoms:  depressed mood, difficulty concentrating, suicidal thoughts with specific plan, anxiety, decreased appetite, (Hypo) Manic Symptoms:  Delusions, Distractibility, Elevated Mood, Flight of Ideas, Hallucinations, Impulsivity, Labiality of Mood, Sexually Inapproprite Behavior, Anxiety Symptoms:  Excessive Worry, Psychotic Symptoms:  Delusions, Hallucinations: Auditory Visual Paranoia, PTSD Symptoms: NA Total Time spent with patient: 1 hour  Past Psychiatric History:  - previous dx of schizoaffective  bipolar type - multiple inpt stays with last admission to [facility] in 10/2017 - outpt follow up at [facility] - 2 previous suicide attempts at age 77 and 21  Is the patient  at risk to self? Yes.    Has the patient been a risk to self in the past 6 months? Yes.    Has the patient been a risk to self within the distant past? Yes.    Is the patient a risk to others? Yes.    Has the patient been a risk to others in the past 6 months? Yes.    Has the patient been a risk to others within the distant past? Yes.     Prior Inpatient Therapy:   Prior Outpatient Therapy:    Alcohol Screening: Patient refused Alcohol Screening Tool: Yes 1. How often do you have a drink containing alcohol?: Never 2. How many drinks containing alcohol do you have on a typical day when you are drinking?: 1 or 2 3. How often do you have six or more drinks on one occasion?: Never AUDIT-C Score: 0 4. How often during the last year have you found that you were not able to stop drinking once you had started?: Never 5. How often during the last year have you failed to do what was normally expected from you becasue of drinking?: Never 6. How often during the last year have you needed a first drink in the morning to get yourself going after a heavy drinking session?: Never 7. How often during the last year have you had a feeling of guilt of remorse after drinking?: Never 8. How often during the last year have you been unable to remember what happened the night before because you had been drinking?: Never 9. Have you or someone else been injured as a result of your drinking?: No 10. Has a relative or friend or a doctor or another health worker been concerned about your drinking or suggested you cut down?: No Alcohol Use Disorder Identification Test Final Score (AUDIT): 0 Intervention/Follow-up: Patient Refused Substance Abuse History in the last 12 months:  No. Consequences of Substance Abuse: NA Previous Psychotropic Medications: Yes  Psychological Evaluations: No  Past Medical History:  Past Medical History:  Diagnosis Date  . Anemia   . Anxiety   . Asthma   . Bipolar 1 disorder ([place])   .  Breast cancer ([place])   . Depression   . Diabetes mellitus without complication ([place])   . Hypertension   . Insomnia, persistent   . Schizophrenic disorder ([place])   . Seizures ([place])     Past Surgical History:  Procedure Laterality Date  . BREAST SURGERY Left   . TONSILLECTOMY     Family History:  Family History  Problem Relation Age of Onset  . Depression Mother   . Gout Mother   . Cancer Father        prostate  . Other Father        lung issue  . Alcoholism Other   . Heart attack Paternal Grandfather   . Heart attack Paternal Grandmother   . Heart attack Maternal Grandmother   . Heart attack Maternal Grandfather   . Depression Son   . Anxiety disorder Son    Family Psychiatric  History: hx of depression with pt's mother. Tobacco Screening: Have you used any form of tobacco in the last 30 days? (Cigarettes, Smokeless Tobacco, Cigars, and/or Pipes): No Social History: Pt is from the [place] area. She  lives on her own in an apartment. She is unemployed after she left job at [employer] about 1 month ago. She is separated from her husband. She has two children ages 71 and 21 whom live with pt's mother. She denies legal and trauma history. Social History   Substance and Sexual Activity  Alcohol Use No     Social History   Substance and Sexual Activity  Drug Use No    Additional Social History: Marital status: Separated Separated, when?: 2001 - Pt and husband continue to be married but live separately, due to Pt's religious beliefs about marriage.                         Allergies:   Allergies  Allergen Reactions  . Haldol [Haloperidol Decanoate] Other (See Comments)    Stiffness, eyes bulging  . Penicillins Nausea And Vomiting    Has patient had a PCN reaction causing immediate rash, facial/tongue/throat swelling, SOB or lightheadedness with hypotension:UNSURE  Has patient had a PCN reaction causing severe rash involving mucus membranes or skin necrosis:  UNSURE Has patient had a PCN reaction that required hospitalization:UNSURE Has patient had a PCN reaction occurring within the last 10 years:No If all of the above answers are "NO", then may proceed with Cephalosporin use. CHILDHOOD REACTION  . Pollen Extract   . Shrimp [Shellfish Allergy] Rash   Lab Results:  Results for orders placed or performed during the hospital encounter of 01/21/18 (from the past 48 hour(s))  hCG, quantitative, pregnancy     Status: None   Collection Time: 01/21/18  5:40 PM  Result Value Ref Range   hCG, Beta Chain, Quant, S <1 <5 mIU/mL    Comment:          GEST. AGE      CONC.  (mIU/mL)   <=1 WEEK        5 - 50     2 WEEKS       50 - 500     3 WEEKS       100 - 10,000     4 WEEKS     1,000 - 30,000     5 WEEKS     3,500 - 115,000   6-8 WEEKS     12,000 - 270,000    12 WEEKS     15,000 - 220,000        FEMALE AND NON-PREGNANT FEMALE:     LESS THAN 5 mIU/mL Performed at [facility], [place] [address], [place], [place] [address]   Salicylate level     Status: None   Collection Time: 01/21/18  5:40 PM  Result Value Ref Range   Salicylate Lvl <6.0 2.8 - 30.0 mg/dL    Comment: Performed at [facility], [place] [address] [place], [place] [address]  Acetaminophen level     Status: Abnormal   Collection Time: 01/21/18  5:40 PM  Result Value Ref Range   Acetaminophen (Tylenol), Serum <10 (L) 10 - 30 ug/mL    Comment: (NOTE) Therapeutic concentrations vary significantly. A range of 10-30 ug/mL  may be an effective concentration for many patients. However, some  are best treated at concentrations outside of this range. Acetaminophen concentrations >150 ug/mL at 4 hours after ingestion  and >50 ug/mL at 12 hours after ingestion are often associated with  toxic reactions. Performed at [facility], [place] [address], [place], [place] [address]   Comprehensive  metabolic panel     Status: None   Collection  Time: 01/21/18  5:40 PM  Result Value Ref Range   Sodium 143 135 - 145 mmol/L   Potassium 3.7 3.5 - 5.1 mmol/L   Chloride 111 101 - 111 mmol/L   CO2 23 22 - 32 mmol/L   Glucose, Bld 87 65 - 99 mg/dL   BUN 15 6 - 20 mg/dL   Creatinine, Ser 0.81 0.44 - 1.00 mg/dL   Calcium 9.1 8.9 - 10.3 mg/dL   Total Protein 7.1 6.5 - 8.1 g/dL   Albumin 4.0 3.5 - 5.0 g/dL   AST 16 15 - 41 U/L   ALT 16 14 - 54 U/L   Alkaline Phosphatase 42 38 - 126 U/L   Total Bilirubin 0.5 0.3 - 1.2 mg/dL   GFR calc non Af Amer >60 >60 mL/min   GFR calc Af Amer >60 >60 mL/min    Comment: (NOTE) The eGFR has been calculated using the CKD EPI equation. This calculation has not been validated in all clinical situations. eGFR's persistently <60 mL/min signify possible Chronic Kidney Disease.    Anion gap 9 5 - 15    Comment: Performed at [facility], [place] [address], [place], [place] [address]  Ethanol     Status: None   Collection Time: 01/21/18  5:40 PM  Result Value Ref Range   Alcohol, Ethyl (B) <10 <10 mg/dL    Comment: (NOTE) Lowest detectable limit for serum alcohol is 10 mg/dL. For medical purposes only. Performed at [facility], [place] [address], [place], [place] [address]   CBC     Status: Abnormal   Collection Time: 01/21/18  5:40 PM  Result Value Ref Range   WBC 7.2 4.0 - 10.5 K/uL    Comment: WHITE COUNT CONFIRMED ON SMEAR   RBC 3.95 3.87 - 5.11 MIL/uL   Hemoglobin 12.1 12.0 - 15.0 g/dL   HCT 35.9 (L) 36.0 - 46.0 %   MCV 90.9 78.0 - 100.0 fL   MCH 30.6 26.0 - 34.0 pg   MCHC 33.7 30.0 - 36.0 g/dL   RDW 13.0 11.5 - 15.5 %   Platelets 257 150 - 400 K/uL    Comment: Performed at [facility], [place] [address], [place], [place] [address]  Rapid urine drug screen (hospital performed)     Status: None   Collection Time: 01/21/18  6:28 PM  Result Value Ref Range   Opiates NONE DETECTED NONE DETECTED   Cocaine NONE DETECTED NONE DETECTED    Benzodiazepines NONE DETECTED NONE DETECTED   Amphetamines NONE DETECTED NONE DETECTED   Tetrahydrocannabinol NONE DETECTED NONE DETECTED   Barbiturates NONE DETECTED NONE DETECTED    Comment: (NOTE) DRUG SCREEN FOR MEDICAL PURPOSES ONLY.  IF CONFIRMATION IS NEEDED FOR ANY PURPOSE, NOTIFY LAB WITHIN 5 DAYS. LOWEST DETECTABLE LIMITS FOR URINE DRUG SCREEN Drug Class                     Cutoff (ng/mL) Amphetamine and metabolites    1000 Barbiturate and metabolites    200 Benzodiazepine                 383 Tricyclics and metabolites     300 Opiates and metabolites        300 Cocaine and metabolites        300 THC  50 Performed at [facility], [place] [address] [place], [place] [address]     Blood Alcohol level:  Lab Results  Component Value Date   ETH <10 01/21/2018   ETH <10 22/33/6122    Metabolic Disorder Labs:  Lab Results  Component Value Date   HGBA1C 5.0 11/21/2017   MPG 96.8 11/21/2017   MPG 123 01/15/2015   Lab Results  Component Value Date   PROLACTIN 5.4 11/21/2017   Lab Results  Component Value Date   CHOL 180 11/21/2017   TRIG 55 11/21/2017   HDL 54 11/21/2017   CHOLHDL 3.3 11/21/2017   VLDL 11 11/21/2017   LDLCALC 115 (H) 11/21/2017   LDLCALC 111 (H) 01/15/2015    Current Medications: Current Facility-Administered Medications  Medication Dose Route Frequency Provider Last Rate Last Dose  . acetaminophen (TYLENOL) tablet 650 mg  650 mg Oral Q6H PRN Ethelene Hal, NP      . albuterol (PROVENTIL HFA;VENTOLIN HFA) 108 (90 Base) MCG/ACT inhaler 2 puff  2 puff Inhalation Q6H PRN Ethelene Hal, NP      . alum & mag hydroxide-simeth (MAALOX/MYLANTA) 200-200-20 MG/5ML suspension 30 mL  30 mL Oral Q4H PRN Ethelene Hal, NP      . ARIPiprazole (ABILIFY) tablet 20 mg  20 mg Oral QHS Ethelene Hal, NP   20 mg at 01/22/18 2135  . asenapine (SAPHRIS) sublingual tablet 5 mg  5 mg  Sublingual BID Maris Berger T, MD      . benztropine (COGENTIN) tablet 1 mg  1 mg Oral BID PRN Ethelene Hal, NP      . divalproex (DEPAKOTE ER) 24 hr tablet 500 mg  500 mg Oral BID Maris Berger T, MD      . hydrochlorothiazide (HYDRODIURIL) tablet 25 mg  25 mg Oral Daily Ethelene Hal, NP   25 mg at 01/23/18 0803  . hydrOXYzine (ATARAX/VISTARIL) tablet 25 mg  25 mg Oral TID PRN Ethelene Hal, NP   25 mg at 01/22/18 2136  . ziprasidone (GEODON) injection 20 mg  20 mg Intramuscular Q12H PRN Ethelene Hal, NP       And  . LORazepam (ATIVAN) tablet 1 mg  1 mg Oral PRN Ethelene Hal, NP      . magnesium hydroxide (MILK OF MAGNESIA) suspension 30 mL  30 mL Oral Daily PRN Ethelene Hal, NP      . mometasone-formoterol [facility]) 200-5 MCG/ACT inhaler 2 puff  2 puff Inhalation BID Ethelene Hal, NP   2 puff at 01/23/18 0804  . montelukast (SINGULAIR) tablet 10 mg  10 mg Oral QHS Ethelene Hal, NP   10 mg at 01/22/18 2135  . traZODone (DESYREL) tablet 100 mg  100 mg Oral QHS Ethelene Hal, NP   100 mg at 01/22/18 2136  . traZODone (DESYREL) tablet 50 mg  50 mg Oral QHS PRN Ethelene Hal, NP       PTA Medications: Medications Prior to Admission  Medication Sig Dispense Refill Last Dose  . albuterol (PROVENTIL HFA;VENTOLIN HFA) 108 (90 Base) MCG/ACT inhaler Inhale 2 puffs into the lungs every 6 (six) hours as needed for wheezing or shortness of breath.   Past Month at Unknown time  . ARIPiprazole (ABILIFY) 20 MG tablet Take 1 tablet (20 mg total) by mouth at bedtime. 30 tablet 0 08/15/2017 at Unknown time  . ARIPiprazole (ABILIFY) 20 MG tablet Take 1 tablet (20 mg total)  by mouth at bedtime. 14 tablet 0 Past Week at Unknown time  . benztropine (COGENTIN) 1 MG tablet Take 1 tablet (1 mg total) by mouth 2 (two) times daily as needed (EPS). 30 tablet 0 unknown  . budesonide-formoterol (SYMBICORT) 160-4.5 MCG/ACT  inhaler Inhale 2 puffs into the lungs 2 (two) times daily. For Asthma 1 Inhaler 12 unknown  . divalproex (DEPAKOTE ER) 500 MG 24 hr tablet Take 1 tablet (500 mg total) by mouth 2 (two) times daily. 60 tablet 0 11/29/2017 at Unknown time  . hydrochlorothiazide (HYDRODIURIL) 25 MG tablet Take 1 tablet (25 mg total) by mouth daily. 30 tablet 0 unknown  . montelukast (SINGULAIR) 10 MG tablet Take 10 mg by mouth at bedtime.   unknown  . potassium chloride (K-DUR) 10 MEQ tablet Take 1 tablet (10 mEq total) by mouth daily. 20 tablet 0 unknown  . traZODone (DESYREL) 100 MG tablet Take 1 tablet (100 mg total) by mouth at bedtime. 30 tablet 0 unknown    Musculoskeletal: Strength & Muscle Tone: within normal limits Gait & Station: normal Patient leans: N/A  Psychiatric Specialty Exam: Physical Exam  Nursing note and vitals reviewed.   Review of Systems  Constitutional: Negative for chills and fever.  Respiratory: Negative for cough and shortness of breath.   Cardiovascular: Negative for chest pain.  Gastrointestinal: Negative for abdominal pain, heartburn, nausea and vomiting.  Psychiatric/Behavioral: Negative for depression, hallucinations and suicidal ideas. The patient is nervous/anxious and has insomnia.     Blood pressure (!) 116/92, pulse 92, temperature 98.6 F (37 C), temperature source Oral, resp. rate 18, height '5\' 6"'  (1.676 m), weight 93.9 kg (207 lb), last menstrual period 12/23/2017, SpO2 100 %.Body mass index is 33.41 kg/m.  General Appearance: Casual and Disheveled  Eye Contact:  Good  Speech:  Pressured  Volume:  Normal  Mood:  Euphoric  Affect:  Blunt and Congruent  Thought Process:  Disorganized and Descriptions of Associations: Tangential  Orientation:  Full (Time, Place, and Person)  Thought Content:  Delusions, Hallucinations: Auditory Visual, Ideas of Reference:   Paranoia Delusions and Paranoid Ideation  Suicidal Thoughts:  Yes.  without intent/plan  Homicidal  Thoughts:  No  Memory:  Immediate;   Fair Recent;   Fair Remote;   Fair  Judgement:  Poor  Insight:  Lacking  Psychomotor Activity:  Normal  Concentration:  Concentration: Fair  Recall:  [employer] of Knowledge:  Fair  Language:  Fair  Akathisia:  No  Handed:    AIMS (if indicated):     Assets:  Resilience Social Support  ADL's:  Intact  Cognition:  WNL  Sleep:  Number of Hours: 5.75   Treatment Plan Summary: Daily contact with patient to assess and evaluate symptoms and progress in treatment and Medication management  Observation Level/Precautions:  15 minute checks  Laboratory:  CBC Chemistry Profile HbAIC HCG UDS UA, depakote level  Psychotherapy:  Encourage participation in groups and therapeutic milieu   Medications:  Start saphris 14m SL BID. Continue depakote ER 5059mpo BID, Continue Abilify 2058mo qDay. Continue Abilify Maintena 400m52m q28 days (last given 5/21). Continue other medications: albuterol, cogentin, HCTZ, atarax, dulera, singulair, and trazodone.   Consultations:    Discharge Concerns:    Estimated LOS: 5-7 days  Other:     Physician Treatment Plan for Primary Diagnosis: Schizoaffective disorder, bipolar type ([facility] Term Goal(s): Improvement in symptoms so as ready for discharge  Short Term Goals: Ability to demonstrate  self-control will improve  Physician Treatment Plan for Secondary Diagnosis: Principal Problem:   Schizoaffective disorder, bipolar type ([place])  Long Term Goal(s): Improvement in symptoms so as ready for discharge  Short Term Goals: Ability to identify and develop effective coping behaviors will improve  I certify that inpatient services furnished can reasonably be expected to improve the patient's condition.    Pennelope Bracken, MD 5/29/201911:36 AM

## 2018-01-23 NOTE — BHH Suicide Risk Assessment (Signed)
[facility] Admission Suicide Risk Assessment   Nursing information obtained from:  Patient Demographic factors:  Unemployed Current Mental Status:  NA Loss Factors:  NA Historical Factors:  Family history of suicide Risk Reduction Factors:  Positive social support  Total Time spent with patient: 1 hour Principal Problem: Schizoaffective disorder, bipolar type ([place]) Diagnosis:   Patient Active Problem List   Diagnosis Date Noted  . Encounter for medical clearance for patient hold [Z00.8]   . Noncompliance with treatment [Z91.19] 07/16/2015  . Schizoaffective disorder, bipolar type ([place]) [F25.0] 01/11/2015   Subjective Data: See H&P for details  Continued Clinical Symptoms:  Alcohol Use Disorder Identification Test Final Score (AUDIT): 0 The "Alcohol Use Disorders Identification Test", Guidelines for Use in Primary Care, Second Edition.  World Pharmacologist [facility]). Score between 0-7:  no or low risk or alcohol related problems. Score between 8-15:  moderate risk of alcohol related problems. Score between 16-19:  high risk of alcohol related problems. Score 20 or above:  warrants further diagnostic evaluation for alcohol dependence and treatment.   CLINICAL FACTORS:   Severe Anxiety and/or Agitation Bipolar Disorder:   Mixed State Schizophrenia:   Paranoid or undifferentiated type More than one psychiatric diagnosis Currently Psychotic Previous Psychiatric Diagnoses and Treatments Medical Diagnoses and Treatments/Surgeries    Psychiatric Specialty Exam: Physical Exam  Nursing note and vitals reviewed.   Review of Systems  Constitutional: Negative for chills and fever.  Respiratory: Negative for cough and shortness of breath.   Cardiovascular: Negative for chest pain.  Gastrointestinal: Negative for abdominal pain, heartburn, nausea and vomiting.  Psychiatric/Behavioral: Positive for depression, hallucinations and suicidal ideas. The patient is nervous/anxious and has  insomnia.     Blood pressure (!) 116/92, pulse 92, temperature 98.6 F (37 C), temperature source Oral, resp. rate 18, height 5\' 6"  (1.676 m), weight 93.9 kg (207 lb), last menstrual period 12/23/2017, SpO2 100 %.Body mass index is 33.41 kg/m.    COGNITIVE FEATURES THAT CONTRIBUTE TO RISK:  None    SUICIDE RISK:   Mild:  Suicidal ideation of limited frequency, intensity, duration, and specificity.  There are no identifiable plans, no associated intent, mild dysphoria and related symptoms, good self-control (both objective and subjective assessment), few other risk factors, and identifiable protective factors, including available and accessible social support.  PLAN OF CARE: See H&P for details  I certify that inpatient services furnished can reasonably be expected to improve the patient's condition.   Pennelope Bracken, MD 01/23/2018, 11:54 AM

## 2018-01-23 NOTE — Progress Notes (Signed)
Recreation Therapy Notes  INPATIENT RECREATION THERAPY ASSESSMENT  Patient Details Name: Marixa Mellott MRN: [id number] DOB: 12-02-1970 Today's Date: 01/23/2018       Information Obtained From: Patient  Able to Participate in Assessment/Interview: Yes  Patient Presentation: Alert, Confused  Reason for Admission (Per Patient): Other (Comments)(Someone called her an informant and it triggered her.)  Patient Stressors: Family, Other (Comment)(Feared her family was going broke, her children and father; being called names)  Coping Skills:   Isolation, Journal, Sports, TV, Music, Exercise, Deep Breathing, Impulsivity, Talk, Art, Prayer, Avoidance, Read, Dance, Hot Bath/Shower  Leisure Interests (2+):  Individual - Reading, Music - Singing, Community - Other (Comment), [employer] - Designer, jewellery, Individual - Other (Comment)(Cooking; church)  Frequency of Recreation/Participation: Weekly  Awareness of Community Resources:  Yes  Community Resources:  Library, [employer], Other (Comment), Restaurants(Mental health; Occupational psychologist center)  Current Use: Yes  If no, Barriers?:    Expressed Interest in [place]: Yes  [employer] of Residence:  [place]  Patient Main Form of Transportation: Diplomatic Services operational officer  Patient Strengths:  Friendly; Hate being late/keep things relevant  Patient Identified Areas of Improvement:  Commitment; being clean/hygiene/swearing; be a better mother  Patient Goal for Hospitalization:  "Learn listening skills; wants to get back at somebody"- Pt didn't specify who  Current SI (including self-harm):  No  Current HI:  No  Current AVH: No  Staff Intervention Plan: Group Attendance, Collaborate with Interdisciplinary Treatment Team  Consent to Intern Participation: N/A    Victorino Sparrow, LRT/CTRS  Ria Comment, Renee Erb A 01/23/2018, 1:13 PM

## 2018-01-23 NOTE — BHH Suicide Risk Assessment (Signed)
[place] INPATIENT:  Family/Significant Other Suicide Prevention Education  Suicide Prevention Education:  Education Completed; [name], father, [phone], has been identified by the patient as the family member/significant other with whom the patient will be residing, and identified as the person(s) who will aid the patient in the event of a mental health crisis (suicidal ideations/suicide attempt).  With written consent from the patient, the family member/significant other has been provided the following suicide prevention education, prior to the and/or following the discharge of the patient.  The suicide prevention education provided includes the following:  Suicide risk factors  Suicide prevention and interventions  National Suicide Hotline telephone number  [facility] assessment telephone number  [facility] Emergency Assistance [place] and/or Residential Mobile Crisis Unit telephone number  Request made of family/significant other to:  Remove weapons (e.g., guns, rifles, knives), all items previously/currently identified as safety concern.  No guns in the home, per [name].  Remove drugs/medications (over-the-counter, prescriptions, illicit drugs), all items previously/currently identified as a safety concern.  The family member/significant other verbalizes understanding of the suicide prevention education information provided.  The family member/significant other agrees to remove the items of safety concern listed above.  [name] said pt phone has been out of minutes for the past 3 weeks and only came back on about one week ago.  He stays in pretty regular contact with pt, including face to face but he had not been able to see her even when he went by her apartment.  He said he had heard some concerns about her arguing with her neighbor from her "contact group" but he was not real clear on who that group was.  Possibly [employer].  [name] said pt did  not recover very well after her last hospitalization--only got back to about 75% and then sort of declined after that until now.  He is glad that she "checked herself in" and was able to recognize that she needed to come back to hospital.    Joanne Chars, [place] 01/23/2018, 11:14 AM

## 2018-01-23 NOTE — Progress Notes (Signed)
Recreation Therapy Notes  Date: 5.29.19 Time: 1000 Location: 500 Hall Dayroom   Group Topic: Communication, Team Building, Problem Solving  Goal Area(s) Addresses:  Patient will effectively work with peer towards shared goal.  Patient will identify skills used to make activity successful.  Patient will identify how skills used during activity can be used to reach post d/c goals.   Behavioral Response: Engaged  Intervention: STEM Activity  Activity: Geophysicist/field seismologist. In teams patients were given 12 plastic drinking straws and a length of masking tape. Using the materials provided patients were asked to build a landing pad to catch a golf ball dropped from approximately 6 feet in the air.   Education:Social Skills, Discharge Planning   Education Outcome: Acknowledges education/In group clarification offered/Needs additional education.   Clinical Observations/Feedback: Pt was active and engaged.  Pt worked well with her peers.  Pt was talking to herself at moments and gave some confusing answers during processing.  Pt was able to explain the group used communication, teamwork and resilience to complete the activity.  Pt also stated "fear of failing or worrying about failure" can cause problems for people.  Pt also stated you have to be "resourceful and ask for help" when comes to your support system.    Victorino Sparrow, LRT/CTRS       Victorino Sparrow A 01/23/2018 12:36 PM

## 2018-01-23 NOTE — Plan of Care (Signed)
  Problem: Education: Goal: Knowledge of [facility] Education information/materials will improve Outcome: Progressing   Problem: Activity: Goal: Interest or engagement in leisure activities will improve Outcome: Progressing D: Pt visible in hall on initial contact. A & O to self and place . Denies SI, HI, AVH and pain "no, I will not do that". Presents with rapid, pressured and tangential speech. Pt has been intrusive, hyperactive and hyper verbal as well. Disorganized / grandiose on interactions "I've travelled the world, been to [place] and lots more other places". Reports she slept well last night with good appetite. Rates her depression, anxiety and hopelessness all 0/10. Pt attended scheduled groups with minimal prompts. A: Introduced self to pt. Emotional support and availability provided to pt. Encouraged pt to voice concerns, comply with treatment regimen and attend to her ADLS. All medications administered with verbal education and effects monitored. Safety checks maintained at Q 15 minutes intervals without outburst or self harm gestures.   R: Pt receptive to care. Cooperative with unit routines. Compliant with medications when offered. Denies adverse drug reactions when assessed this shift. Tolerates all PO intake well. Remains safe on and off unit.

## 2018-01-23 NOTE — Progress Notes (Signed)
Adult Psychoeducational Group Note  Date:  01/23/2018 Time:  8:49 AM  Group Topic/Focus:  Goals Group:   The focus of this group is to help patients establish daily goals to achieve during treatment and discuss how the patient can incorporate goal setting into their daily lives to aide in recovery.  Participation Level:  Active  Participation Quality:  Appropriate  Affect:  Appropriate  Cognitive:  Appropriate  Insight: Appropriate  Engagement in Group:  Engaged  Modes of Intervention:  Orientation  Additional Comments:  Pt says her goals today were to participate more often in group and to be more appropriate in the social settings here.   Tyrell Antonio Jahlon Baines 01/23/2018, 8:49 AM
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
## 2018-01-24 MED ORDER — ASENAPINE MALEATE 5 MG SL SUBL
10.0000 mg | SUBLINGUAL_TABLET | Freq: Two times a day (BID) | SUBLINGUAL | Status: DC
  Administered 2018-01-24 – 2018-01-30 (×12): 10 mg via SUBLINGUAL
  Filled 2018-01-24 (×14): qty 2

## 2018-01-24 NOTE — Plan of Care (Signed)
Pt progressing/not progressing in the following metrics. Pt denies any si/hi/ah/vh and verbally agrees to approach staff if these become apparent. Q55m checks implemented and continued. Pt safe on the unit. Will continue to monitor.  Problem: Education: Goal: Knowledge of [facility] Education information/materials will improve Outcome: Progressing Goal: Emotional status will improve Outcome: Progressing   Problem: Activity: Goal: Interest or engagement in leisure activities will improve Outcome: Progressing Goal: Imbalance in normal sleep/wake cycle will improve Outcome: Progressing   Problem: Self-Concept: Goal: Will verbalize positive feelings about self Outcome: Progressing

## 2018-01-24 NOTE — Progress Notes (Signed)
[facility] MD Progress Note  01/24/2018 2:26 PM [name]  MRN:  [id number] Subjective:    [name] is a 47 y/o F with history of schizoaffective bipolar type who was admitted voluntarily from [place] where she presented with worsening anxiety, paranoia, delusions that her neighbor thinks she is a police informant, decreased need for sleep, symptoms of mania, and poor medication adherence (pt reports she received Abilify Maintena injection last on 01/15/18, though). Pt has recent history of discharge from [facility] on 11/27/17. Pt was medically cleared in the ED, and then transferred to [facility] for additional treatment and evaluation. She was restarted on previous medications of depakote and oral abilify (Maintena received last on 5/21), and she also was started on trial of saphris to address symptoms of mania. Pt had incremental improvement of her presenting symptoms.  Upon evaluation today, pt is pressured in speech and tangential, but her symptoms are slightly improved compared to initial interview, and she is generally cooperative with interview. She shares, "I'm good. I'm trying to get my money right." Pt shares that she signed a request for release within 72 hours because she feels that she is doing well and she would like to address her concerns outside the hospital relating to her bank account. Discussed with patient about importance of taking time in the hospital to address her presenting symptoms and allow time for the medications to help, and pt verbalized good understanding. She offered to rescind the request for discharge. Pt is in agreement to increase dose of saphris tonight. She denies SI/HI/AH/VH. She denies other physical complaints. She is in agreement with the above plan, and she had no further questions, comments, or concerns.  Principal Problem: Schizoaffective disorder, bipolar type ([place]) Diagnosis:   Patient Active Problem List   Diagnosis Date Noted  . Encounter for medical clearance for  patient hold [Z00.8]   . Noncompliance with treatment [Z91.19] 07/16/2015  . Schizoaffective disorder, bipolar type ([place]) [F25.0] 01/11/2015   Total Time spent with patient: 30 minutes  Past Psychiatric History: See H&P  Past Medical History:  Past Medical History:  Diagnosis Date  . Anemia   . Anxiety   . Asthma   . Bipolar 1 disorder ([place])   . Breast cancer ([place])   . Depression   . Diabetes mellitus without complication ([place])   . Hypertension   . Insomnia, persistent   . Schizophrenic disorder ([place])   . Seizures ([place])     Past Surgical History:  Procedure Laterality Date  . BREAST SURGERY Left   . TONSILLECTOMY     Family History:  Family History  Problem Relation Age of Onset  . Depression Mother   . Gout Mother   . Cancer Father        prostate  . Other Father        lung issue  . Alcoholism Other   . Heart attack Paternal Grandfather   . Heart attack Paternal Grandmother   . Heart attack Maternal Grandmother   . Heart attack Maternal Grandfather   . Depression Son   . Anxiety disorder Son    Family Psychiatric  History: see H&P Social History:  Social History   Substance and Sexual Activity  Alcohol Use No     Social History   Substance and Sexual Activity  Drug Use No    Social History   Socioeconomic History  . Marital status: Single    Spouse name: Not on file  . Number of children: Not on  file  . Years of education: Not on file  . Highest education level: Not on file  Occupational History  . Not on file  Social Needs  . Financial resource strain: Not on file  . Food insecurity:    Worry: Not on file    Inability: Not on file  . Transportation needs:    Medical: Not on file    Non-medical: Not on file  Tobacco Use  . Smoking status: Former Smoker    Types: Cigarettes  . Smokeless tobacco: Never Used  Substance and Sexual Activity  . Alcohol use: No  . Drug use: No  . Sexual activity: Never    Birth control/protection: Pill   Lifestyle  . Physical activity:    Days per week: Not on file    Minutes per session: Not on file  . Stress: Not on file  Relationships  . Social connections:    Talks on phone: Not on file    Gets together: Not on file    Attends religious service: Not on file    Active member of club or organization: Not on file    Attends meetings of clubs or organizations: Not on file    Relationship status: Not on file  Other Topics Concern  . Not on file  Social History Narrative  . Not on file   Additional Social History:                         Sleep: Good  Appetite:  Good  Current Medications: Current Facility-Administered Medications  Medication Dose Route Frequency Provider Last Rate Last Dose  . acetaminophen (TYLENOL) tablet 650 mg  650 mg Oral Q6H PRN Ethelene Hal, NP      . albuterol (PROVENTIL HFA;VENTOLIN HFA) 108 (90 Base) MCG/ACT inhaler 2 puff  2 puff Inhalation Q6H PRN Ethelene Hal, NP      . alum & mag hydroxide-simeth (MAALOX/MYLANTA) 200-200-20 MG/5ML suspension 30 mL  30 mL Oral Q4H PRN Ethelene Hal, NP      . ARIPiprazole (ABILIFY) tablet 20 mg  20 mg Oral QHS Ethelene Hal, NP   20 mg at 01/23/18 2117  . asenapine (SAPHRIS) sublingual tablet 10 mg  10 mg Sublingual BID Maris Berger T, MD      . benztropine (COGENTIN) tablet 1 mg  1 mg Oral BID PRN Ethelene Hal, NP      . divalproex (DEPAKOTE ER) 24 hr tablet 500 mg  500 mg Oral BID Pennelope Bracken, MD   500 mg at 01/24/18 0806  . hydrochlorothiazide (HYDRODIURIL) tablet 25 mg  25 mg Oral Daily Ethelene Hal, NP   25 mg at 01/24/18 1962  . hydrOXYzine (ATARAX/VISTARIL) tablet 25 mg  25 mg Oral TID PRN Ethelene Hal, NP   25 mg at 01/23/18 2118  . magnesium hydroxide (MILK OF MAGNESIA) suspension 30 mL  30 mL Oral Daily PRN Ethelene Hal, NP      . mometasone-formoterol [facility]) 200-5 MCG/ACT inhaler 2 puff  2 puff  Inhalation BID Ethelene Hal, NP   2 puff at 01/24/18 [phone]  . montelukast (SINGULAIR) tablet 10 mg  10 mg Oral QHS Ethelene Hal, NP   10 mg at 01/23/18 2119  . traZODone (DESYREL) tablet 100 mg  100 mg Oral QHS Ethelene Hal, NP   100 mg at 01/23/18 2117  . ziprasidone (GEODON) injection 20 mg  20 mg  Intramuscular Q12H PRN Ethelene Hal, NP        Lab Results: No results found for this or any previous visit (from the past 48 hour(s)).  Blood Alcohol level:  Lab Results  Component Value Date   ETH <10 01/21/2018   ETH <10 96/22/2979    Metabolic Disorder Labs: Lab Results  Component Value Date   HGBA1C 5.0 11/21/2017   MPG 96.8 11/21/2017   MPG 123 01/15/2015   Lab Results  Component Value Date   PROLACTIN 5.4 11/21/2017   Lab Results  Component Value Date   CHOL 180 11/21/2017   TRIG 55 11/21/2017   HDL 54 11/21/2017   CHOLHDL 3.3 11/21/2017   VLDL 11 11/21/2017   LDLCALC 115 (H) 11/21/2017   LDLCALC 111 (H) 01/15/2015    Physical Findings: AIMS: Facial and Oral Movements Muscles of Facial Expression: None, normal Lips and Perioral Area: None, normal Jaw: None, normal Tongue: None, normal,Extremity Movements Upper (arms, wrists, hands, fingers): None, normal Lower (legs, knees, ankles, toes): None, normal, Trunk Movements Neck, shoulders, hips: None, normal, Overall Severity Severity of abnormal movements (highest score from questions above): None, normal Incapacitation due to abnormal movements: None, normal [name] awareness of abnormal movements (rate only patient's report): No Awareness, Dental Status Current problems with teeth and/or dentures?: No Does patient usually wear dentures?: No  CIWA:    COWS:     Musculoskeletal: Strength & Muscle Tone: within normal limits Gait & Station: normal Patient leans: N/A  Psychiatric Specialty Exam: Physical Exam  Nursing note and vitals reviewed.   Review of Systems   Constitutional: Negative for chills and fever.  Respiratory: Negative for cough and shortness of breath.   Cardiovascular: Negative for chest pain.  Gastrointestinal: Negative for abdominal pain, heartburn, nausea and vomiting.  Psychiatric/Behavioral: Negative for depression, hallucinations and suicidal ideas. The patient is not nervous/anxious and does not have insomnia.     Blood pressure (!) 145/81, pulse 100, temperature 99.3 F (37.4 C), temperature source Oral, resp. rate 18, height 5\' 6"  (1.676 m), weight 93.9 kg (207 lb), last menstrual period 12/23/2017, SpO2 100 %.Body mass index is 33.41 kg/m.  General Appearance: Casual and Fairly Groomed  Eye Contact:  Good  Speech:  Clear and Coherent and Pressured  Volume:  Normal  Mood:  Euthymic  Affect:  Congruent and Constricted  Thought Process:  Coherent and Goal Directed  Orientation:  Full (Time, Place, and Person)  Thought Content:  Logical  Suicidal Thoughts:  No  Homicidal Thoughts:  No  Memory:  Immediate;   Fair Recent;   Fair Remote;   Fair  Judgement:  Poor  Insight:  Lacking  Psychomotor Activity:  Normal  Concentration:  Concentration: Fair  Recall:  [employer] of Knowledge:  Fair  Language:  Fair  Akathisia:  No  Handed:    AIMS (if indicated):     Assets:  Resilience  ADL's:  Intact  Cognition:  WNL  Sleep:  Number of Hours: 5.75   Treatment Plan Summary: Daily contact with patient to assess and evaluate symptoms and progress in treatment and Medication management   -Continue inpatient hospitalization  -Schizoaffective disorder, bipolar type   -Continue abilify Maintena 400mg  IM q28 days (last given on 5/21/1()   -Continue abilify 20mg  po qDay   -Continue Depakote ER 500mg  po BID   -Plan for depakote level on 01/27/18   -Change Saphris 5mg  SL BID to Saphris 10mg  SL BID  -COPD   -Continue  albuterol 108 MCG/ACT take 2 puffs q6h prn wheezing/SOB   -Continue Dulera 200-5 MCG/ACT take 2 puffs BID    -Continue singulair 10mg  po qhs  -Anxiety   -Continue vistaril 25mg  po TID prn anxiety  -EPS   -Continue Cogentin 1mg  po BID prn EPS  -HTN    -Continue HCTZ 25mg  po qDay  -Agitation    -Continue geodon 20mg  IM q12h prn agitation  -Continue Ativan 1mg  po once prn agitation  -Insomnia   -Continue trazodone 100mg  po qhs  -Encourage participation in groups and therapeutic milieu  -Disposition planning will be ongoing  Pennelope Bracken, MD 01/24/2018, 2:26 PM

## 2018-01-24 NOTE — Progress Notes (Signed)
Recreation Therapy Notes  Date: 5.30.19 Time: 1000 Location: 500 Hall Dayroom    Group Topic: Communication  Goal Area(s) Addresses:  Patient will effectively communicate with peers in group.  Patient will verbalize benefit of healthy communication. Patient will verbalize positive effect of healthy communication on post d/c goals.  Patient will identify communication techniques that made activity effective for group.   Intervention: Geometrical Drawings  Activity: Back to Back Drawings.  Patients were paired into groups of two.  Each group was given a picture of geometrical shapes.  One person from each gave the instructions on how to draw the picture, while the other person listened so they could draw it.  Once finished the pair would compare the drawing with the original to see how close they came.  Patients would then receive and different picture and switch roles.  Education: Communication, Discharge Planning  Education Outcome: Acknowledges understanding/In group clarification offered/Needs additional education.   Clinical Observations/Feedback: Pt did not attend group.    Victorino Sparrow, LRT/CTRS         Victorino Sparrow A 01/24/2018 11:41 AM

## 2018-01-24 NOTE — Tx Team (Signed)
Interdisciplinary Treatment and Diagnostic Plan Update  01/24/2018 Time of Session: 10:07 AM  [name] MRN: [id number]  Principal Diagnosis: Schizoaffective disorder, bipolar type [facility])  Secondary Diagnoses: Principal Problem:   Schizoaffective disorder, bipolar type ([place])   Current Medications:  Current Facility-Administered Medications  Medication Dose Route Frequency Provider Last Rate Last Dose  . acetaminophen (TYLENOL) tablet 650 mg  650 mg Oral Q6H PRN Ethelene Hal, NP      . albuterol (PROVENTIL HFA;VENTOLIN HFA) 108 (90 Base) MCG/ACT inhaler 2 puff  2 puff Inhalation Q6H PRN Ethelene Hal, NP      . alum & mag hydroxide-simeth (MAALOX/MYLANTA) 200-200-20 MG/5ML suspension 30 mL  30 mL Oral Q4H PRN Ethelene Hal, NP      . ARIPiprazole (ABILIFY) tablet 20 mg  20 mg Oral QHS Ethelene Hal, NP   20 mg at 01/23/18 2117  . asenapine (SAPHRIS) sublingual tablet 5 mg  5 mg Sublingual BID Pennelope Bracken, MD   5 mg at 01/24/18 7169  . benztropine (COGENTIN) tablet 1 mg  1 mg Oral BID PRN Ethelene Hal, NP      . divalproex (DEPAKOTE ER) 24 hr tablet 500 mg  500 mg Oral BID Pennelope Bracken, MD   500 mg at 01/24/18 0806  . hydrochlorothiazide (HYDRODIURIL) tablet 25 mg  25 mg Oral Daily Ethelene Hal, NP   25 mg at 01/24/18 6789  . hydrOXYzine (ATARAX/VISTARIL) tablet 25 mg  25 mg Oral TID PRN Ethelene Hal, NP   25 mg at 01/23/18 2118  . magnesium hydroxide (MILK OF MAGNESIA) suspension 30 mL  30 mL Oral Daily PRN Ethelene Hal, NP      . mometasone-formoterol [facility]) 200-5 MCG/ACT inhaler 2 puff  2 puff Inhalation BID Ethelene Hal, NP   2 puff at 01/24/18 [phone]  . montelukast (SINGULAIR) tablet 10 mg  10 mg Oral QHS Ethelene Hal, NP   10 mg at 01/23/18 2119  . traZODone (DESYREL) tablet 100 mg  100 mg Oral QHS Ethelene Hal, NP   100 mg at 01/23/18 2117  . ziprasidone (GEODON)  injection 20 mg  20 mg Intramuscular Q12H PRN Ethelene Hal, NP        PTA Medications: Medications Prior to Admission  Medication Sig Dispense Refill Last Dose  . albuterol (PROVENTIL HFA;VENTOLIN HFA) 108 (90 Base) MCG/ACT inhaler Inhale 2 puffs into the lungs every 6 (six) hours as needed for wheezing or shortness of breath.   Past Month at Unknown time  . ARIPiprazole (ABILIFY) 20 MG tablet Take 1 tablet (20 mg total) by mouth at bedtime. 30 tablet 0 08/15/2017 at Unknown time  . ARIPiprazole (ABILIFY) 20 MG tablet Take 1 tablet (20 mg total) by mouth at bedtime. 14 tablet 0 Past Week at Unknown time  . benztropine (COGENTIN) 1 MG tablet Take 1 tablet (1 mg total) by mouth 2 (two) times daily as needed (EPS). 30 tablet 0 unknown  . budesonide-formoterol (SYMBICORT) 160-4.5 MCG/ACT inhaler Inhale 2 puffs into the lungs 2 (two) times daily. For Asthma 1 Inhaler 12 unknown  . divalproex (DEPAKOTE ER) 500 MG 24 hr tablet Take 1 tablet (500 mg total) by mouth 2 (two) times daily. 60 tablet 0 11/29/2017 at Unknown time  . hydrochlorothiazide (HYDRODIURIL) 25 MG tablet Take 1 tablet (25 mg total) by mouth daily. 30 tablet 0 unknown  . montelukast (SINGULAIR) 10 MG tablet Take 10 mg by mouth at  bedtime.   unknown  . potassium chloride (K-DUR) 10 MEQ tablet Take 1 tablet (10 mEq total) by mouth daily. 20 tablet 0 unknown  . traZODone (DESYREL) 100 MG tablet Take 1 tablet (100 mg total) by mouth at bedtime. 30 tablet 0 unknown    Patient Stressors: Financial difficulties Health problems Medication change or noncompliance  Patient Strengths: Ability for insight Average or above average intelligence Capable of independent living Communication skills  Treatment Modalities: Medication Management, Group therapy, Case management,  1 to 1 session with clinician, Psychoeducation, Recreational therapy.   Physician Treatment Plan for Primary Diagnosis: Schizoaffective disorder, bipolar type  ([place]) Long Term Goal(s): Improvement in symptoms so as ready for discharge  Short Term Goals: Ability to demonstrate self-control will improve Ability to identify and develop effective coping behaviors will improve  Medication Management: Evaluate patient's response, side effects, and tolerance of medication regimen.  Therapeutic Interventions: 1 to 1 sessions, Unit Group sessions and Medication administration.  Evaluation of Outcomes: Progressing  Physician Treatment Plan for Secondary Diagnosis: Principal Problem:   Schizoaffective disorder, bipolar type ([place])   Long Term Goal(s): Improvement in symptoms so as ready for discharge  Short Term Goals: Ability to demonstrate self-control will improve Ability to identify and develop effective coping behaviors will improve  Medication Management: Evaluate patient's response, side effects, and tolerance of medication regimen.  Therapeutic Interventions: 1 to 1 sessions, Unit Group sessions and Medication administration.  Evaluation of Outcomes: Progressing   RN Treatment Plan for Primary Diagnosis: Schizoaffective disorder, bipolar type ([place]) Long Term Goal(s): Knowledge of disease and therapeutic regimen to maintain health will improve  Short Term Goals: Ability to identify and develop effective coping behaviors will improve and Compliance with prescribed medications will improve  Medication Management: RN will administer medications as ordered by provider, will assess and evaluate patient's response and provide education to patient for prescribed medication. RN will report any adverse and/or side effects to prescribing provider.  Therapeutic Interventions: 1 on 1 counseling sessions, Psychoeducation, Medication administration, Evaluate responses to treatment, Monitor vital signs and CBGs as ordered, Perform/monitor CIWA, COWS, AIMS and Fall Risk screenings as ordered, Perform wound care treatments as ordered.  Evaluation of  Outcomes: Progressing   LCSW Treatment Plan for Primary Diagnosis: Schizoaffective disorder, bipolar type ([place]) Long Term Goal(s): Safe transition to appropriate next level of care at discharge, Engage patient in therapeutic group addressing interpersonal concerns.  Short Term Goals: Engage patient in aftercare planning with referrals and resources  Therapeutic Interventions: Assess for all discharge needs, 1 to 1 time with Social worker, Explore available resources and support systems, Assess for adequacy in community support network, Educate family and significant other(s) on suicide prevention, Complete Psychosocial Assessment, Interpersonal group therapy.  Evaluation of Outcomes: Met  Return home, follow up [place] in Treatment: Attending groups: Yes Participating in groups: Yes Taking medication as prescribed: Yes Toleration medication: Yes, no side effects reported at this time Family/Significant other contact made: Yes Patient understands diagnosis: No Limited insight Discussing patient identified problems/goals with staff: Yes Medical problems stabilized or resolved: Yes Denies suicidal/homicidal ideation: Yes Issues/concerns per patient self-inventory: None Other: N/A  New problem(s) identified: None identified at this time.   New Short Term/Long Term Goal(s): "I want to keep my independence".   Discharge Plan or Barriers:   Reason for Continuation of Hospitalization: Mania  Medication stabilization   Estimated Length of Stay: 6/4  Attendees: Patient: [name] 01/24/2018  10:07 AM  Physician: Harrell Gave  Nancy Fetter, MD 01/24/2018  10:07 AM  Nursing: Elesa Massed, RN 01/24/2018  10:07 AM  RN Care Manager: Lars Pinks, RN 01/24/2018  10:07 AM  Social Worker: Ripley Fraise 01/24/2018  10:07 AM  Recreational Therapist: Winfield Cunas 01/24/2018  10:07 AM  Other: Norberto Sorenson 01/24/2018  10:07 AM  Other:  01/24/2018  10:07 AM    Scribe for Treatment  Team:  Roque Lias LCSW 01/24/2018 10:07 AM

## 2018-01-24 NOTE — Progress Notes (Signed)
D: pt found in bed. Pt compliant/pleasant with medication administration. Pt denies any pain. Pt denies any si/hi/ah/vh and verbally agree to approach staff if these become apparent. Pt observed to be responding to ah though denied. Pt requested a 72h form. A: medication given per protocol and standing orders; not by this Probation officer. Pt given support and encouragement. Will continue to monitor. Pt given education about the 72h form. q64m checks implemented and continued.  R: patient safe on the unit. Will continue to monitor. 72h form signed at [phone].

## 2018-01-24 NOTE — Progress Notes (Signed)
Adult Psychoeducational Group Note  Date:  01/24/2018 Time:  8:55 PM  Group Topic/Focus:  Wrap-Up Group:   The focus of this group is to help patients review their daily goal of treatment and discuss progress on daily workbooks.  Participation Level:  Active  Participation Quality:  Monopolizing  Affect:  Not Congruent  Cognitive:  Alert, Disorganized and Oriented  Insight: Lacking  Engagement in Group:  Monopolizing  Modes of Intervention:  Exploration and Support  Additional Comments:  Pt verbalized that she felt good. Pt verbalized that she is looking forward to Monday. Pt verbalized that something positive is that she loves ever body.  Etty Isaac, Patrick [place] 01/24/2018, 8:55 PM

## 2018-01-24 NOTE — BHH Group Notes (Addendum)
LCSW Group Therapy Note  01/24/2018 1:15pm  Type of Therapy/Topic:  Group Therapy:  Balance in Life  Participation Level:  Active  Description of Group:    This group will address the concept of balance and how it feels and looks when one is unbalanced. Patients will be encouraged to process areas in their lives that are out of balance and identify reasons for remaining unbalanced. Facilitators will guide patients in utilizing problem-solving interventions to address and correct the stressor making their life unbalanced. Understanding and applying boundaries will be explored and addressed for obtaining and maintaining a balanced life. Patients will be encouraged to explore ways to assertively make their unbalanced needs known to significant others in their lives, using other group members and facilitator for support and feedback.  Therapeutic Goals: 1. Patient will identify two or more emotions or situations they have that consume much of in their lives. 2. Patient will identify signs/triggers that life has become out of balance:  3. Patient will identify two ways to set boundaries in order to achieve balance in their lives:  4. Patient will demonstrate ability to communicate their needs through discussion and/or role plays  Summary of Patient Progress:  Stayed the entire time, engaged throughout.  Pressured, tangential throughout.  Difficult to follow.  "ECT can be helpful.  I get my check on the 3rd. I used to work at [employer].  I like to give people a quarter for an ice cream cone, but that is different than being taken advantage of." Was able to acknowledge that she feels unbalanced "when there is a voice yelling in my head, and it goes jjjjjjjjjjjjjjjjj." [talking very fast in non-sensical language.]   Therapeutic Modalities:   Cognitive [place] Training  Trish Mage, LCSW 01/24/2018 3:25 PM

## 2018-01-24 NOTE — Progress Notes (Signed)
Pt got up and saw a news story and got upset at the story, pt stated that was her child that was denied food due to not having enough money in the account. Pt given 1 mg Ativan per [facility]

## 2018-01-25 NOTE — Progress Notes (Signed)
D: Patient denies SI, HI or AVH this evening. Patient presents as anxious, animated and appears to be responding to internal stimuli.  Pt. Has rapid, pressured speech and has a disorganized thought process.  Pt. Attended evening wrap up group reporting that she had a great day.  Pt. States her appetite and sleep are good, she denies any physical complaints.  A: Patient given emotional support from RN. Patient encouraged to come to staff with concerns and/or questions. [name] medication routine continued. [name] orders and plan of care reviewed.   R: Patient remains appropriate and cooperative. Will continue to monitor patient q15 minutes for safety.

## 2018-01-25 NOTE — BHH Group Notes (Signed)
[place] LCSW Group Therapy  01/25/2018  1:05 PM  Type of Therapy:  Group therapy  Participation Level:  Active  Participation Quality:  Attentive  Affect:  Flat  Cognitive:  Oriented  Insight:  Limited  Engagement in Therapy:  Limited  Modes of Intervention:  Discussion, Socialization  Summary of Progress/Problems:  Chaplain was here to lead a group on themes of hope and courage. "Hope is the the witch of the [place] who doesn't know where to land.  hope is peace, faith, love. Hope is a powerful force that can unite all people.  It's very powerful, and despite all odds, I can make it."  Intrusive.  Wanting to interject constantly when other people were talking.  Roque Lias B 01/25/2018 1:29 PM

## 2018-01-25 NOTE — Plan of Care (Signed)
Pt progressing in the following metrics. Pt denies any si/hi/ah/vh and verbally agrees to approach staff if these become apparent. Pt rates her depression/hopelessness/anxiety all 0/10. Pt rates her pain a 1/10. Q23m checks implemented and continued. Will continue to monitor.  Problem: Education: Goal: Emotional status will improve Outcome: Progressing Goal: Mental status will improve Outcome: Progressing   Problem: Coping: Goal: Ability to identify and develop effective coping behavior will improve Outcome: Progressing   Problem: Activity: Goal: Interest or engagement in leisure activities will improve Outcome: Progressing Goal: Imbalance in normal sleep/wake cycle will improve Outcome: Progressing   Problem: Self-Concept: Goal: Will verbalize positive feelings about self Outcome: Progressing

## 2018-01-25 NOTE — Progress Notes (Signed)
[facility] MD Progress Note  01/25/2018 12:36 PM [name]  MRN:  [id number] Subjective:    [name] is a 47 y/o F with history of schizoaffective bipolar type who was admitted voluntarily from [place] where she presented with worsening anxiety, paranoia, delusions that her neighbor thinks she is a police informant, decreased need for sleep, symptoms of mania, and poor medication adherence (pt reports she received Abilify Maintena injection last on 01/15/18, though). Pt has recent history of discharge from [facility] on 11/27/17. Pt was medically cleared in the ED, and then transferred to [facility] for additional treatment and evaluation. She was restarted on previous medications of depakote and oral abilify (Maintena received last on 5/21), and she also was started on trial of saphris to address symptoms of mania, and dose has been titrated up during her stay. Pt has demonstrated incremental improvement of her presenting symptoms.  Upon evaluation today, pt has some ongoing pressured speech and tangentiality, but overall these symptoms have improved compared to previous interviews. Pt shares, "I'm alright. I got my problems with my money sorted out." Pt is circumstantial and describes about her anxiety related to her social security accounts and fears that her relatives were accessing it inappropriately, but she notes that overall her rate of thinking and flight of ideas has improved. She continues to report AH, stating, "I've got a little but it's better." She denies SI/HI/VH. She feels that saphris has been a helpful addition to her regimen, and she is in agreement to continue at the current dose. She denies side effects. She is sleeping adequately. Her appetite is good. She is in agreement with the above plan, and she had no further questions, comments, or concerns.  Principal Problem: Schizoaffective disorder, bipolar type ([place]) Diagnosis:   Patient Active Problem List   Diagnosis Date Noted  . Encounter for medical  clearance for patient hold [Z00.8]   . Noncompliance with treatment [Z91.19] 07/16/2015  . Schizoaffective disorder, bipolar type ([place]) [F25.0] 01/11/2015   Total Time spent with patient: 30 minutes  Past Psychiatric History: see H&P  Past Medical History:  Past Medical History:  Diagnosis Date  . Anemia   . Anxiety   . Asthma   . Bipolar 1 disorder ([place])   . Breast cancer ([place])   . Depression   . Diabetes mellitus without complication ([place])   . Hypertension   . Insomnia, persistent   . Schizophrenic disorder ([place])   . Seizures ([place])     Past Surgical History:  Procedure Laterality Date  . BREAST SURGERY Left   . TONSILLECTOMY     Family History:  Family History  Problem Relation Age of Onset  . Depression Mother   . Gout Mother   . Cancer Father        prostate  . Other Father        lung issue  . Alcoholism Other   . Heart attack Paternal Grandfather   . Heart attack Paternal Grandmother   . Heart attack Maternal Grandmother   . Heart attack Maternal Grandfather   . Depression Son   . Anxiety disorder Son    Family Psychiatric  History: see H&P Social History:  Social History   Substance and Sexual Activity  Alcohol Use No     Social History   Substance and Sexual Activity  Drug Use No    Social History   Socioeconomic History  . Marital status: Single    Spouse name: Not on file  . Number  of children: Not on file  . Years of education: Not on file  . Highest education level: Not on file  Occupational History  . Not on file  Social Needs  . Financial resource strain: Not on file  . Food insecurity:    Worry: Not on file    Inability: Not on file  . Transportation needs:    Medical: Not on file    Non-medical: Not on file  Tobacco Use  . Smoking status: Former Smoker    Types: Cigarettes  . Smokeless tobacco: Never Used  Substance and Sexual Activity  . Alcohol use: No  . Drug use: No  . Sexual activity: Never    Birth  control/protection: Pill  Lifestyle  . Physical activity:    Days per week: Not on file    Minutes per session: Not on file  . Stress: Not on file  Relationships  . Social connections:    Talks on phone: Not on file    Gets together: Not on file    Attends religious service: Not on file    Active member of club or organization: Not on file    Attends meetings of clubs or organizations: Not on file    Relationship status: Not on file  Other Topics Concern  . Not on file  Social History Narrative  . Not on file   Additional Social History:                         Sleep: Fair  Appetite:  Good  Current Medications: Current Facility-Administered Medications  Medication Dose Route Frequency Provider Last Rate Last Dose  . acetaminophen (TYLENOL) tablet 650 mg  650 mg Oral Q6H PRN Ethelene Hal, NP      . albuterol (PROVENTIL HFA;VENTOLIN HFA) 108 (90 Base) MCG/ACT inhaler 2 puff  2 puff Inhalation Q6H PRN Ethelene Hal, NP      . alum & mag hydroxide-simeth (MAALOX/MYLANTA) 200-200-20 MG/5ML suspension 30 mL  30 mL Oral Q4H PRN Ethelene Hal, NP      . ARIPiprazole (ABILIFY) tablet 20 mg  20 mg Oral QHS Ethelene Hal, NP   20 mg at 01/24/18 2149  . asenapine (SAPHRIS) sublingual tablet 10 mg  10 mg Sublingual BID Pennelope Bracken, MD   10 mg at 01/25/18 0804  . benztropine (COGENTIN) tablet 1 mg  1 mg Oral BID PRN Ethelene Hal, NP      . divalproex (DEPAKOTE ER) 24 hr tablet 500 mg  500 mg Oral BID Pennelope Bracken, MD   500 mg at 01/25/18 0804  . hydrochlorothiazide (HYDRODIURIL) tablet 25 mg  25 mg Oral Daily Ethelene Hal, NP   25 mg at 01/25/18 0804  . hydrOXYzine (ATARAX/VISTARIL) tablet 25 mg  25 mg Oral TID PRN Ethelene Hal, NP   25 mg at 01/25/18 0128  . magnesium hydroxide (MILK OF MAGNESIA) suspension 30 mL  30 mL Oral Daily PRN Ethelene Hal, NP      . mometasone-formoterol [facility])  200-5 MCG/ACT inhaler 2 puff  2 puff Inhalation BID Ethelene Hal, NP   2 puff at 01/25/18 0804  . montelukast (SINGULAIR) tablet 10 mg  10 mg Oral QHS Ethelene Hal, NP   10 mg at 01/24/18 2149  . traZODone (DESYREL) tablet 100 mg  100 mg Oral QHS Ethelene Hal, NP   100 mg at 01/24/18 2149  . ziprasidone (  GEODON) injection 20 mg  20 mg Intramuscular Q12H PRN Ethelene Hal, NP        Lab Results: No results found for this or any previous visit (from the past 9 hour(s)).  Blood Alcohol level:  Lab Results  Component Value Date   ETH <10 01/21/2018   ETH <10 02/18/7627    Metabolic Disorder Labs: Lab Results  Component Value Date   HGBA1C 5.0 11/21/2017   MPG 96.8 11/21/2017   MPG 123 01/15/2015   Lab Results  Component Value Date   PROLACTIN 5.4 11/21/2017   Lab Results  Component Value Date   CHOL 180 11/21/2017   TRIG 55 11/21/2017   HDL 54 11/21/2017   CHOLHDL 3.3 11/21/2017   VLDL 11 11/21/2017   LDLCALC 115 (H) 11/21/2017   LDLCALC 111 (H) 01/15/2015    Physical Findings: AIMS: Facial and Oral Movements Muscles of Facial Expression: None, normal Lips and Perioral Area: None, normal Jaw: None, normal Tongue: None, normal,Extremity Movements Upper (arms, wrists, hands, fingers): None, normal Lower (legs, knees, ankles, toes): None, normal, Trunk Movements Neck, shoulders, hips: None, normal, Overall Severity Severity of abnormal movements (highest score from questions above): None, normal Incapacitation due to abnormal movements: None, normal [name] awareness of abnormal movements (rate only patient's report): No Awareness, Dental Status Current problems with teeth and/or dentures?: No Does patient usually wear dentures?: No  CIWA:    COWS:     Musculoskeletal: Strength & Muscle Tone: within normal limits Gait & Station: normal Patient leans: N/A  Psychiatric Specialty Exam: Physical Exam  Nursing note and vitals  reviewed.   Review of Systems  Constitutional: Negative for chills and fever.  Respiratory: Negative for cough and shortness of breath.   Cardiovascular: Negative for chest pain.  Gastrointestinal: Negative for abdominal pain, heartburn, nausea and vomiting.  Psychiatric/Behavioral: Positive for hallucinations. Negative for depression and suicidal ideas. The patient is not nervous/anxious and does not have insomnia.     Blood pressure (!) 127/91, pulse (!) 107, temperature 98.3 F (36.8 C), temperature source Oral, resp. rate 16, height 5\' 6"  (1.676 m), weight 93.9 kg (207 lb), last menstrual period 12/23/2017, SpO2 100 %.Body mass index is 33.41 kg/m.  General Appearance: Casual and Fairly Groomed  Eye Contact:  Good  Speech:  Clear and Coherent and Pressured  Volume:  Normal  Mood:  Euthymic  Affect:  Appropriate, Blunt and Congruent  Thought Process:  Coherent, Goal Directed and Descriptions of Associations: Tangential  Orientation:  Full (Time, Place, and Person)  Thought Content:  Hallucinations: Auditory  Suicidal Thoughts:  No  Homicidal Thoughts:  No  Memory:  Immediate;   Fair Recent;   Fair Remote;   Fair  Judgement:  Fair  Insight:  Fair  Psychomotor Activity:  Normal  Concentration:  Concentration: Fair  Recall:  [employer] of Knowledge:  Fair  Language:  Fair  Akathisia:  No  Handed:    AIMS (if indicated):     Assets:  Resilience Social Support  ADL's:  Intact  Cognition:  WNL  Sleep:  Number of Hours: 5.25   Treatment Plan Summary: Daily contact with patient to assess and evaluate symptoms and progress in treatment and Medication management   -Continue inpatient hospitalization  -Schizoaffective disorder, bipolar type             -Continue abilify Maintena 400mg  IM q28 days (last given on 5/21/1()             -  Continue abilify 20mg  po qDay             -Continue Depakote ER 500mg  po BID             -Plan for depakote level on 01/27/18              -Continue Saphris 10mg  SL BID  -COPD             -Continue albuterol 108 MCG/ACT take 2 puffs q6h prn wheezing/SOB             -Continue Dulera 200-5 MCG/ACT take 2 puffs BID             -Continue singulair 10mg  po qhs  -Anxiety             -Continue vistaril 25mg  po TID prn anxiety  -EPS             -Continue Cogentin 1mg  po BID prn EPS  -HTN             -Continue HCTZ 25mg  po qDay  -Agitation             -Continue geodon 20mg  IM q12h prn agitation             -Continue Ativan 1mg  po once prn agitation  -Insomnia             -Continue trazodone 100mg  po qhs  -Encourage participation in groups and therapeutic milieu  -Disposition planning will be ongoing    Pennelope Bracken, MD 01/25/2018, 12:36 PM

## 2018-01-25 NOTE — Progress Notes (Signed)
Adult Psychoeducational Group Note  Date:  01/25/2018 Time:  8:52 PM  Group Topic/Focus:  Wrap-Up Group:   The focus of this group is to help patients review their daily goal of treatment and discuss progress on daily workbooks.  Participation Level:  Active  Participation Quality:  Appropriate  Affect:  Appropriate  Cognitive:  Appropriate  Insight: Appropriate  Engagement in Group:  Engaged  Modes of Intervention:  Discussion  Additional Comments:  The patient expressed that she rates today a 10.The patient also said that she attended groups.  Nash Shearer 01/25/2018, 8:52 PM

## 2018-01-25 NOTE — Plan of Care (Signed)
D: Pt denies SI/HI/AVH. Pt is pleasant and cooperative. Pt visible on the unit this evening, pt less intrusive this evening .   A: Pt was offered support and encouragement. Pt was given scheduled medications. Pt was encourage to attend groups. Q 15 minute checks were done for safety.   R:Pt attends groups and interacts well with peers and staff. Pt is taking medication. Pt has no complaints .Pt receptive to treatment and safety maintained on unit.   Problem: Education: Goal: Emotional status will improve Outcome: Progressing   Problem: Education: Goal: Mental status will improve Outcome: Progressing   Problem: Self-Concept: Goal: Will verbalize positive feelings about self Outcome: Progressing

## 2018-01-25 NOTE — Progress Notes (Signed)
D: pt found in the dayroom interacting with peers. Pt rates her depression/hopelessness/anxiety all 0/10. Pt rates her pain a 1/10. Pt states her goal for today is to work on herself. Pt states she slept well. Pt elated that she completed a task she had to finish with the social security office on the phone this morning. Pt denies any si/hi/ah/vh and verbally agrees to approach staff if these become apparent. Reactions to stimuli seem absent when speaking with her.  A: pt given support and encouragement. Medications provided not by this Probation officer; compliant. q28m checks implemented and continued. R: pt safe on the unit. Will continue to monitor.

## 2018-01-25 NOTE — Progress Notes (Signed)
Recreation Therapy Notes  Date: 5.31.19 Time: 1000 Location: 500 Hall Dayroom  Group Topic: Wellness  Goal Area(s) Addresses:  Patient will define components of whole wellness. Patient will verbalize benefit of whole wellness.  Behavioral Response: Engaged  Intervention:  Music  Activity:  Exercise.  LRT let each patient pick an exercise(ie toe touches, jumping jacks, toe raises)  to lead the group through.  Patients completed four rounds of exercises.  Education: Wellness, Dentist.   Education Outcome: Acknowledges education/In group clarification offered/Needs additional education.   Clinical Observations/Feedback: Pt lead group through various activities.  Pt was active and engaged.  Pt stated some people don't take time for themselves because of "fear".   Victorino Sparrow, LRT/CTRS         Victorino Sparrow A 01/25/2018 11:49 AM

## 2018-01-26 DIAGNOSIS — G259 Extrapyramidal and movement disorder, unspecified: Secondary | ICD-10-CM

## 2018-01-26 DIAGNOSIS — I10 Essential (primary) hypertension: Secondary | ICD-10-CM

## 2018-01-26 DIAGNOSIS — Z87891 Personal history of nicotine dependence: Secondary | ICD-10-CM

## 2018-01-26 DIAGNOSIS — R451 Restlessness and agitation: Secondary | ICD-10-CM

## 2018-01-26 DIAGNOSIS — J449 Chronic obstructive pulmonary disease, unspecified: Secondary | ICD-10-CM

## 2018-01-26 NOTE — [facility] Group Notes (Signed)
  [facility] LCSW Group Therapy Note  Date/Time:  01/26/2018 11:15AM-12:00PM  Type of Therapy and Topic:  Group Therapy:  Feelings About Hospitalization  Participation Level:  Active   Description of Group This process group involved patients discussing their feelings related to being hospitalized, as well as the benefits they see to being in the hospital.  These feelings and benefits were itemized.  The group then brainstormed specific ways in which they could seek those same benefits when they discharge and return home.  Therapeutic Goals 1. Patient will identify and describe positive and negative feelings related to hospitalization 2. Patient will verbalize benefits of hospitalization to themselves personally 3. Patients will brainstorm together ways they can obtain similar benefits in the outpatient setting, identify barriers to wellness and possible solutions  Summary of Patient Progress:  The patient expressed her primary feelings about being hospitalized are "I don't mind, it's a necessary evil."  Her speech was circumstantial and her logic difficult to follow.  She did state she is going to focus at discharge on "self-care without being sedentary."  She talked about being on disability and not having a Programmer, applications, being homeless at one point but not currently.  Therapeutic Modalities Cognitive Behavioral Therapy Motivational Interviewing    Selmer Dominion, LCSW 01/26/2018, 1:17 PM

## 2018-01-26 NOTE — Progress Notes (Signed)
Adult Psychoeducational Group Note  Date:  01/26/2018 Time:  9:26 PM  Group Topic/Focus:  Wrap-Up Group:   The focus of this group is to help patients review their daily goal of treatment and discuss progress on daily workbooks.  Participation Level:  Active  Participation Quality:  Appropriate  Affect:  Appropriate  Cognitive:  Appropriate  Insight: Appropriate  Engagement in Group:  Engaged  Modes of Intervention:  Discussion  Additional Comments:  Patient reports that she would rate her day a 41 or a 10 because her dad came to visit and she is focused on thinking about "hope is." Patient reports that she did not go outside, but reports that her day was great because she resolved something and she reached part of her goal. Patient expressed disappointment because she went off on someone earlier in the day.   Doyle Askew 01/26/2018, 9:26 PM

## 2018-01-26 NOTE — Plan of Care (Signed)
D: Pt denies SI/HI/AVH. Pt is pleasant and cooperative. Pt visible on the unit interacting with staff and peers.   A: Pt was offered support and encouragement. Pt was given scheduled medications. Pt was encourage to attend groups. Q 15 minute checks were done for safety.   R:Pt attends groups and interacts well with peers and staff. Pt is taking medication. Pt has no complaints.Pt receptive to treatment and safety maintained on unit.   Problem: Education: Goal: Emotional status will improve Outcome: Progressing   Problem: Education: Goal: Mental status will improve Outcome: Progressing   Problem: Self-Concept: Goal: Will verbalize positive feelings about self Outcome: Progressing

## 2018-01-26 NOTE — [facility] Group Notes (Signed)
[place] Group Notes:  (Nursing/MHT/Case Management/Adjunct)  Date:  01/26/2018  Time:  10:43 AM  Type of Therapy:  Psychoeducational Skills  Participation Level:  Active  Participation Quality:  Appropriate and Attentive  Affect:  Appropriate  Cognitive:  Alert and Appropriate  Insight:  Appropriate and Good  Engagement in Group:  Engaged  Modes of Intervention:  Discussion and Education  Summary of Progress/Problems: Discussed Anger management.  Patient was attentive and receptive.  Coralyn Mark Chantil Bari 01/26/2018, 10:43 AM

## 2018-01-26 NOTE — Plan of Care (Signed)
  Problem: Education: Goal: Knowledge of [facility] Education information/materials will improve Outcome: Progressing   Problem: Activity: Goal: Interest or engagement in leisure activities will improve Outcome: Progressing Goal: Imbalance in normal sleep/wake cycle will improve Outcome: Progressing D: Pt visible on hall phone this AM on initial contact. Denies SI, HI, AVH and pain. Anger outburst X1 this shift; became verbally aggressive and abusive when asked by writer to look at number properly due to report of someone calling administration number "you bitch, fuck you, I'm just on the phone with the my fucking bank, [employer], I don't care about your fucking administrative office, fuck you and I will say what the fuck I want to say". Observed pacing hall in hall at the time. Speech remains rapid, pressured and loud. Reports she's been eating and sleeping well. Continues to be sexually preoccupied. A: Introduced self to pt. Emotional support and availability provided to pt.  Scheduled and PRN (Vistaril) medications administered with verbal education and effects monitored.  Safety checks maintained at Q 15 minutes intervals without outburst or self harm gestures.   R: Pt receptive to care. Compliant with medications when offered. Denies adverse drug reactions when assessed this shift. Tolerates all PO intake well. Remains safe on and off unit.

## 2018-01-26 NOTE — Progress Notes (Signed)
[facility] MD Progress Note  01/26/2018 3:07 PM [name]  MRN:  [id number] Subjective:    [name] seen resting in bed.  She is awake alert and oriented x3.  States she is feeling better today.  Patient presents coherent less pressure during this assessment.  Patient observed interacting with peers and staff on the milieu with active and engaged participation during group session.  Denies depression or depressive symptoms.  States she is taking her medications as prescribed and tolerating them well.  Denies suicidal or homicidal ideations during this assessment.  Mania symptoms have improved.  Reports a good appetite states she is resting well throughout the night.  Chart reviewed Depakote level scheduled for 6/ 2 pending results. denies auditory or visual hallucinations.  Support encouragement reassurance was provided.   History:[name] is a 47 y/o F with history of schizoaffective bipolar type who was admitted voluntarily from [facility] where she presented with worsening anxiety, paranoia, delusions that her neighbor thinks she is a police informant, decreased need for sleep, symptoms of mania, and poor medication adherence (pt reports she received Abilify Maintena injection last on 01/15/18, though). Pt has recent history of discharge from [facility] on 11/27/17. Pt was medically cleared in the ED, and then transferred to [facility] for additional treatment and evaluation. She was restarted on previous medications of depakote and oral abilify (Maintena received last on 5/21), and she also was started on trial of saphris to address symptoms of mania, and dose has been titrated up during her stay. Pt has demonstrated incremental improvement of her presenting symptoms.   Principal Problem: Schizoaffective disorder, bipolar type ([place]) Diagnosis:   Patient Active Problem List   Diagnosis Date Noted  . Encounter for medical clearance for patient hold [Z00.8]   . Noncompliance with treatment [Z91.19] 07/16/2015  .  Schizoaffective disorder, bipolar type ([place]) [F25.0] 01/11/2015   Total Time spent with patient: 30 minutes  Past Psychiatric History: see H&P  Past Medical History:  Past Medical History:  Diagnosis Date  . Anemia   . Anxiety   . Asthma   . Bipolar 1 disorder ([place])   . Breast cancer ([place])   . Depression   . Diabetes mellitus without complication ([place])   . Hypertension   . Insomnia, persistent   . Schizophrenic disorder ([place])   . Seizures ([place])     Past Surgical History:  Procedure Laterality Date  . BREAST SURGERY Left   . TONSILLECTOMY     Family History:  Family History  Problem Relation Age of Onset  . Depression Mother   . Gout Mother   . Cancer Father        prostate  . Other Father        lung issue  . Alcoholism Other   . Heart attack Paternal Grandfather   . Heart attack Paternal Grandmother   . Heart attack Maternal Grandmother   . Heart attack Maternal Grandfather   . Depression Son   . Anxiety disorder Son    Family Psychiatric  History: see H&P Social History:  Social History   Substance and Sexual Activity  Alcohol Use No     Social History   Substance and Sexual Activity  Drug Use No    Social History   Socioeconomic History  . Marital status: Single    Spouse name: Not on file  . Number of children: Not on file  . Years of education: Not on file  . Highest education level: Not on file  Occupational History  . Not on file  Social Needs  . Financial resource strain: Not on file  . Food insecurity:    Worry: Not on file    Inability: Not on file  . Transportation needs:    Medical: Not on file    Non-medical: Not on file  Tobacco Use  . Smoking status: Former Smoker    Types: Cigarettes  . Smokeless tobacco: Never Used  Substance and Sexual Activity  . Alcohol use: No  . Drug use: No  . Sexual activity: Never    Birth control/protection: Pill  Lifestyle  . Physical activity:    Days per week: Not on file    Minutes per  session: Not on file  . Stress: Not on file  Relationships  . Social connections:    Talks on phone: Not on file    Gets together: Not on file    Attends religious service: Not on file    Active member of club or organization: Not on file    Attends meetings of clubs or organizations: Not on file    Relationship status: Not on file  Other Topics Concern  . Not on file  Social History Narrative  . Not on file   Additional Social History:                         Sleep: Fair  Appetite:  Good  Current Medications: Current Facility-Administered Medications  Medication Dose Route Frequency Provider Last Rate Last Dose  . acetaminophen (TYLENOL) tablet 650 mg  650 mg Oral Q6H PRN Ethelene Hal, NP      . albuterol (PROVENTIL HFA;VENTOLIN HFA) 108 (90 Base) MCG/ACT inhaler 2 puff  2 puff Inhalation Q6H PRN Ethelene Hal, NP      . alum & mag hydroxide-simeth (MAALOX/MYLANTA) 200-200-20 MG/5ML suspension 30 mL  30 mL Oral Q4H PRN Ethelene Hal, NP      . ARIPiprazole (ABILIFY) tablet 20 mg  20 mg Oral QHS Ethelene Hal, NP   20 mg at 01/25/18 2127  . asenapine (SAPHRIS) sublingual tablet 10 mg  10 mg Sublingual BID Pennelope Bracken, MD   10 mg at 01/26/18 0757  . benztropine (COGENTIN) tablet 1 mg  1 mg Oral BID PRN Ethelene Hal, NP      . divalproex (DEPAKOTE ER) 24 hr tablet 500 mg  500 mg Oral BID Pennelope Bracken, MD   500 mg at 01/26/18 0757  . hydrochlorothiazide (HYDRODIURIL) tablet 25 mg  25 mg Oral Daily Ethelene Hal, NP   25 mg at 01/26/18 0757  . hydrOXYzine (ATARAX/VISTARIL) tablet 25 mg  25 mg Oral TID PRN Ethelene Hal, NP   25 mg at 01/26/18 0757  . magnesium hydroxide (MILK OF MAGNESIA) suspension 30 mL  30 mL Oral Daily PRN Ethelene Hal, NP      . mometasone-formoterol [facility]) 200-5 MCG/ACT inhaler 2 puff  2 puff Inhalation BID Ethelene Hal, NP   2 puff at 01/26/18 0756   . montelukast (SINGULAIR) tablet 10 mg  10 mg Oral QHS Ethelene Hal, NP   10 mg at 01/25/18 2127  . traZODone (DESYREL) tablet 100 mg  100 mg Oral QHS Ethelene Hal, NP   100 mg at 01/25/18 2127  . ziprasidone (GEODON) injection 20 mg  20 mg Intramuscular Q12H PRN Ethelene Hal, NP        Lab  Results: No results found for this or any previous visit (from the past 48 hour(s)).  Blood Alcohol level:  Lab Results  Component Value Date   ETH <10 01/21/2018   ETH <10 89/[birth year]/1941    Metabolic Disorder Labs: Lab Results  Component Value Date   HGBA1C 5.0 11/21/2017   MPG 96.8 11/21/2017   MPG 123 01/15/2015   Lab Results  Component Value Date   PROLACTIN 5.4 11/21/2017   Lab Results  Component Value Date   CHOL 180 11/21/2017   TRIG 55 11/21/2017   HDL 54 11/21/2017   CHOLHDL 3.3 11/21/2017   VLDL 11 11/21/2017   LDLCALC 115 (H) 11/21/2017   LDLCALC 111 (H) 01/15/2015    Physical Findings: AIMS: Facial and Oral Movements Muscles of Facial Expression: None, normal Lips and Perioral Area: None, normal Jaw: None, normal Tongue: None, normal,Extremity Movements Upper (arms, wrists, hands, fingers): None, normal Lower (legs, knees, ankles, toes): None, normal, Trunk Movements Neck, shoulders, hips: None, normal, Overall Severity Severity of abnormal movements (highest score from questions above): None, normal Incapacitation due to abnormal movements: None, normal [name] awareness of abnormal movements (rate only patient's report): No Awareness, Dental Status Current problems with teeth and/or dentures?: No Does patient usually wear dentures?: No  CIWA:    COWS:     Musculoskeletal: Strength & Muscle Tone: within normal limits Gait & Station: normal Patient leans: N/A  Psychiatric Specialty Exam: Physical Exam  Nursing note and vitals reviewed. Constitutional: She appears well-developed.  Neurological: She is alert.  Psychiatric: She has  a normal mood and affect. Her behavior is normal.    Review of Systems  Constitutional: Negative for chills.  Psychiatric/Behavioral: Positive for hallucinations. Negative for depression and suicidal ideas. The patient is not nervous/anxious and does not have insomnia.   All other systems reviewed and are negative.   Blood pressure (!) 127/91, pulse (!) 107, temperature 98.3 F (36.8 C), temperature source Oral, resp. rate 16, height 5\' 6"  (1.676 m), weight 93.9 kg (207 lb), last menstrual period 12/23/2017, SpO2 100 %.Body mass index is 33.41 kg/m.  General Appearance: Casual and Fairly Groomed  Eye Contact:  Good  Speech:  Clear and Coherent and Pressured  Volume:  Normal  Mood:  Euthymic  Affect:  Appropriate, Blunt and Congruent  Thought Process:  Coherent, Goal Directed and Descriptions of Associations: Tangential  Orientation:  Full (Time, Place, and Person)  Thought Content:  Hallucinations: Auditory  Suicidal Thoughts:  No  Homicidal Thoughts:  No  Memory:  Immediate;   Fair Recent;   Fair Remote;   Fair  Judgement:  Fair  Insight:  Fair  Psychomotor Activity:  Normal  Concentration:  Concentration: Fair  Recall:  [employer] of Knowledge:  Fair  Language:  Fair  Akathisia:  No  Handed:    AIMS (if indicated):     Assets:  Resilience Social Support  ADL's:  Intact  Cognition:  WNL  Sleep:  Number of Hours: 6.25   Treatment Plan Summary: Daily contact with patient to assess and evaluate symptoms and progress in treatment and Medication management   Continue current treatment plan 01/26/2018 as listed below except where noted  -Schizoaffective disorder, bipolar type:             -Continue abilify Maintena 400mg  IM q28 days (last given on 5/21/1()             -Continue abilify 20mg  po qDay             -  Continue Depakote ER 500mg  po BID             -Plan for depakote level on 01/27/18-results pending             -Continue Saphris 10mg  SL BID  -COPD              -Continue albuterol 108 MCG/ACT take 2 puffs q6h prn wheezing/SOB             -Continue Dulera 200-5 MCG/ACT take 2 puffs BID             -Continue singulair 10mg  po qhs  -Anxiety             -Continue vistaril 25mg  po TID prn anxiety  -EPS             -Continue Cogentin 1mg  po BID prn EPS  -HTN             -Continue HCTZ 25mg  po qDay  -Agitation             -Continue geodon 20mg  IM q12h prn agitation             -Continue Ativan 1mg  po once prn agitation  -Insomnia             -Continue trazodone 100mg  po qhs  -Encourage participation in groups and therapeutic milieu -Disposition planning will be ongoing    [place]ill [place], NP 01/26/2018, 3:07 PM

## 2018-01-27 LAB — VALPROIC ACID LEVEL: VALPROIC ACID LVL: 79 ug/mL (ref 50.0–100.0)

## 2018-01-27 NOTE — Progress Notes (Addendum)
[facility] MD Progress Note  01/27/2018 11:50 AM [name]  MRN:  [id number] Subjective:    [name] sitting in dayroom interacting with peers. Reports she is feeling ' okay' today.  Patient presents coherent and her speech is less pressured. Patient has been attending and participating in group sessions.  Repots she has a interviewed on Tuesday and is hopeful to discharged by then. States she has her disability and [employer] insurance figured out. Patient reports taken medications as prescribed and tolerating medication well. Depakote level was reviewed and is within reference range at 79. Support encouragement reassurance was provided.   History:[name] is a 47 y/o F with history of schizoaffective bipolar type who was admitted voluntarily from [place] where she presented with worsening anxiety, paranoia, delusions that her neighbor thinks she is a police informant, decreased need for sleep, symptoms of mania, and poor medication adherence (pt reports she received Abilify Maintena injection last on 01/15/18, though). Pt has recent history of discharge from [facility] on 11/27/17. Pt was medically cleared in the ED, and then transferred to [facility] for additional treatment and evaluation. She was restarted on previous medications of depakote and oral abilify (Maintena received last on 5/21), and she also was started on trial of saphris to address symptoms of mania, and dose has been titrated up during her stay. Pt has demonstrated incremental improvement of her presenting symptoms.   Principal Problem: Schizoaffective disorder, bipolar type ([place]) Diagnosis:   Patient Active Problem List   Diagnosis Date Noted  . Encounter for medical clearance for patient hold [Z00.8]   . Noncompliance with treatment [Z91.19] 07/16/2015  . Schizoaffective disorder, bipolar type ([place]) [F25.0] 01/11/2015   Total Time spent with patient: 30 minutes  Past Psychiatric History: see H&P  Past Medical History:  Past Medical History:   Diagnosis Date  . Anemia   . Anxiety   . Asthma   . Bipolar 1 disorder ([place])   . Breast cancer ([place])   . Depression   . Diabetes mellitus without complication ([place])   . Hypertension   . Insomnia, persistent   . Schizophrenic disorder ([place])   . Seizures ([place])     Past Surgical History:  Procedure Laterality Date  . BREAST SURGERY Left   . TONSILLECTOMY     Family History:  Family History  Problem Relation Age of Onset  . Depression Mother   . Gout Mother   . Cancer Father        prostate  . Other Father        lung issue  . Alcoholism Other   . Heart attack Paternal Grandfather   . Heart attack Paternal Grandmother   . Heart attack Maternal Grandmother   . Heart attack Maternal Grandfather   . Depression Son   . Anxiety disorder Son    Family Psychiatric  History: see H&P Social History:  Social History   Substance and Sexual Activity  Alcohol Use No     Social History   Substance and Sexual Activity  Drug Use No    Social History   Socioeconomic History  . Marital status: Single    Spouse name: Not on file  . Number of children: Not on file  . Years of education: Not on file  . Highest education level: Not on file  Occupational History  . Not on file  Social Needs  . Financial resource strain: Not on file  . Food insecurity:    Worry: Not on file  Inability: Not on file  . Transportation needs:    Medical: Not on file    Non-medical: Not on file  Tobacco Use  . Smoking status: Former Smoker    Types: Cigarettes  . Smokeless tobacco: Never Used  Substance and Sexual Activity  . Alcohol use: No  . Drug use: No  . Sexual activity: Never    Birth control/protection: Pill  Lifestyle  . Physical activity:    Days per week: Not on file    Minutes per session: Not on file  . Stress: Not on file  Relationships  . Social connections:    Talks on phone: Not on file    Gets together: Not on file    Attends religious service: Not on file     Active member of club or organization: Not on file    Attends meetings of clubs or organizations: Not on file    Relationship status: Not on file  Other Topics Concern  . Not on file  Social History Narrative  . Not on file   Additional Social History:                         Sleep: Fair  Appetite:  Good  Current Medications: Current Facility-Administered Medications  Medication Dose Route Frequency Provider Last Rate Last Dose  . acetaminophen (TYLENOL) tablet 650 mg  650 mg Oral Q6H PRN Ethelene Hal, NP      . albuterol (PROVENTIL HFA;VENTOLIN HFA) 108 (90 Base) MCG/ACT inhaler 2 puff  2 puff Inhalation Q6H PRN Ethelene Hal, NP      . alum & mag hydroxide-simeth (MAALOX/MYLANTA) 200-200-20 MG/5ML suspension 30 mL  30 mL Oral Q4H PRN Ethelene Hal, NP      . ARIPiprazole (ABILIFY) tablet 20 mg  20 mg Oral QHS Ethelene Hal, NP   20 mg at 01/26/18 2223  . asenapine (SAPHRIS) sublingual tablet 10 mg  10 mg Sublingual BID Pennelope Bracken, MD   10 mg at 01/27/18 0819  . benztropine (COGENTIN) tablet 1 mg  1 mg Oral BID PRN Ethelene Hal, NP      . divalproex (DEPAKOTE ER) 24 hr tablet 500 mg  500 mg Oral BID Pennelope Bracken, MD   500 mg at 01/27/18 0819  . hydrochlorothiazide (HYDRODIURIL) tablet 25 mg  25 mg Oral Daily Ethelene Hal, NP   25 mg at 01/27/18 5638  . hydrOXYzine (ATARAX/VISTARIL) tablet 25 mg  25 mg Oral TID PRN Ethelene Hal, NP   25 mg at 01/26/18 2223  . magnesium hydroxide (MILK OF MAGNESIA) suspension 30 mL  30 mL Oral Daily PRN Ethelene Hal, NP      . mometasone-formoterol [facility]) 200-5 MCG/ACT inhaler 2 puff  2 puff Inhalation BID Ethelene Hal, NP   2 puff at 01/27/18 [phone]  . montelukast (SINGULAIR) tablet 10 mg  10 mg Oral QHS Ethelene Hal, NP   10 mg at 01/26/18 2223  . traZODone (DESYREL) tablet 100 mg  100 mg Oral QHS Ethelene Hal, NP   100 mg  at 01/26/18 2223  . ziprasidone (GEODON) injection 20 mg  20 mg Intramuscular Q12H PRN Ethelene Hal, NP        Lab Results:  Results for orders placed or performed during the hospital encounter of 01/22/18 (from the past 48 hour(s))  Valproic acid level     Status: None   Collection  Time: 01/27/18  6:10 AM  Result Value Ref Range   Valproic Acid Lvl 79 50.0 - 100.0 ug/mL    Comment: Performed at [facility], [place] [address], [place], [place] [address]    Blood Alcohol level:  Lab Results  Component Value Date   ETH <10 01/21/2018   ETH <10 23/76/2831    Metabolic Disorder Labs: Lab Results  Component Value Date   HGBA1C 5.0 11/21/2017   MPG 96.8 11/21/2017   MPG 123 01/15/2015   Lab Results  Component Value Date   PROLACTIN 5.4 11/21/2017   Lab Results  Component Value Date   CHOL 180 11/21/2017   TRIG 55 11/21/2017   HDL 54 11/21/2017   CHOLHDL 3.3 11/21/2017   VLDL 11 11/21/2017   LDLCALC 115 (H) 11/21/2017   LDLCALC 111 (H) 01/15/2015    Physical Findings: AIMS: Facial and Oral Movements Muscles of Facial Expression: None, normal Lips and Perioral Area: None, normal Jaw: None, normal Tongue: None, normal,Extremity Movements Upper (arms, wrists, hands, fingers): None, normal Lower (legs, knees, ankles, toes): None, normal, Trunk Movements Neck, shoulders, hips: None, normal, Overall Severity Severity of abnormal movements (highest score from questions above): None, normal Incapacitation due to abnormal movements: None, normal [name] awareness of abnormal movements (rate only patient's report): No Awareness, Dental Status Current problems with teeth and/or dentures?: No Does patient usually wear dentures?: No  CIWA:    COWS:     Musculoskeletal: Strength & Muscle Tone: within normal limits Gait & Station: normal Patient leans: N/A  Psychiatric Specialty Exam: Physical Exam  Nursing note and vitals  reviewed. Constitutional: She appears well-developed.  Neurological: She is alert.  Psychiatric: She has a normal mood and affect. Her behavior is normal.    Review of Systems  Constitutional: Negative for chills.  Psychiatric/Behavioral: Positive for hallucinations. Negative for depression and suicidal ideas. The patient is not nervous/anxious and does not have insomnia.   All other systems reviewed and are negative.   Blood pressure (!) 127/96, pulse 100, temperature 98.8 F (37.1 C), temperature source Oral, resp. rate 20, height 5\' 6"  (1.676 m), weight 93.9 kg (207 lb), last menstrual period 12/23/2017, SpO2 100 %.Body mass index is 33.41 kg/m.  General Appearance: Casual and Fairly Groomed  Eye Contact:  Good  Speech:  Clear and Coherent and Pressured- improving   Volume:  Normal  Mood:  Euthymic  Affect:  Appropriate, Blunt and Congruent  Thought Process:  Coherent, Goal Directed and Descriptions of Associations: Tangential  Orientation:  Full (Time, Place, and Person)  Thought Content:  Hallucinations: Auditory  Suicidal Thoughts:  No  Homicidal Thoughts:  No  Memory:  Immediate;   Fair Recent;   Fair Remote;   Fair  Judgement:  Fair  Insight:  Fair  Psychomotor Activity:  Normal  Concentration:  Concentration: Fair  Recall:  [employer] of Knowledge:  Fair  Language:  Fair  Akathisia:  No  Handed:    AIMS (if indicated):     Assets:  Resilience Social Support  ADL's:  Intact  Cognition:  WNL  Sleep:  Number of Hours: 6.75   Treatment Plan Summary: Daily contact with patient to assess and evaluate symptoms and progress in treatment and Medication management   Continue current treatment plan 01/27/2018 as listed below except where noted  -Schizoaffective disorder, bipolar type:             -Continue abilify Maintena 400mg  IM q28 days (last given on  5/21/1()             -Continue abilify 20mg  po qDay             -Continue Depakote ER 500mg  po BID              -Plan for depakote level on 01/27/18-results pending             -Continue Saphris 10mg  SL BID  -COPD             -Continue albuterol 108 MCG/ACT take 2 puffs q6h prn wheezing/SOB             -Continue Dulera 200-5 MCG/ACT take 2 puffs BID             -Continue singulair 10mg  po qhs  -Anxiety             -Continue vistaril 25mg  po TID prn anxiety  -EPS             -Continue Cogentin 1mg  po BID prn EPS  -HTN             -Continue HCTZ 25mg  po qDay  -Agitation             -Continue geodon 20mg  IM q12h prn agitation             -Continue Ativan 1mg  po once prn agitation  -Insomnia             -Continue trazodone 100mg  po qhs  -Encourage participation in groups and therapeutic milieu -Disposition planning will be ongoing   Derrill [place], NP 01/27/2018, 11:50 AM    ..Agree with NP Progress Note

## 2018-01-27 NOTE — [facility] Group Notes (Signed)
[facility] Group Notes:  (Nursing/MHT/Case Management/Adjunct)  Date:  01/27/2018  Time:  4:44 PM  Type of Therapy:  Psychoeducational Skills  Participation Level:  Active  Participation Quality:  Attentive and Redirectable  Affect:  Excited  Cognitive:  Alert and Appropriate  Insight:  Good  Engagement in Group:  Off Topic  Modes of Intervention:  Discussion and Education  Summary of Progress/Problems: Group was on good sleep hygiene and habits. This included, setting a routine, cutting out caffeine, alcohol and nicotine, creating a comfortable environment, and only going to bed when tired.  Lesli Albee 01/27/2018, 4:44 PM

## 2018-01-27 NOTE — [facility] Group Notes (Signed)
[facility] LCSW Group Therapy Note  Date/Time:  01/27/2018  11:00AM-12:00PM  Type of Therapy and Topic:  Group Therapy:  Music and Mood  Participation Level:  Active   Description of Group: In this process group, members listened to a variety of genres of music and identified that different types of music evoke different responses.  Patients were encouraged to identify music that was soothing for them and music that was energizing for them.  Patients discussed how this knowledge can help with wellness and recovery in various ways including managing depression and anxiety as well as encouraging healthy sleep habits.    Therapeutic Goals: 1. Patients will explore the impact of different varieties of music on mood 2. Patients will verbalize the thoughts they have when listening to different types of music 3. Patients will identify music that is soothing to them as well as music that is energizing to them 4. Patients will discuss how to use this knowledge to assist in maintaining wellness and recovery 5. Patients will explore the use of music as a coping skill  Summary of Patient Progress:  At the beginning of group, patient expressed that she felt "great/wonderful."  The patient next to her was intrusive and actively psychotic during group and she kept looking at him as though irritated, but did not say anything to him about his behavior.  She was disorganized in her answers to questions but was able to eventually answer.  Therapeutic Modalities: Solution Focused Brief Therapy Activity   Selmer Dominion, LCSW

## 2018-01-27 NOTE — Plan of Care (Signed)
D:Patient observed in milieu interacting with peers. Patient denies SI/HI and A/V hallucinations. Patient rated depression-0, hopelessness-0, and anxiety 9 today. Patient is anxious this am. Patient is easily redirected and cooperative.  A:Patient encouraged to take medication as prescribe. Patient encouraged to attend group. Patient with Q 15 minute checks in progress and patient remains safe on unit.   R:Patient interacts well with peers and staff. Patient does attend group. Patient remains safe on unit. Monitoring continues.   Problem: Activity: Goal: Imbalance in normal sleep/wake cycle will improve Outcome: Progressing Note:  Patient is sleeping at night at least 6 hours.

## 2018-01-27 NOTE — Plan of Care (Signed)
D: Pt denies SI/HI/AVH. Pt is pleasant and cooperative. Pt continues to be less intrusive this evening. Pt interacting with peers in the dayroom. Pt stated she had a good day and was doing good.   A: Pt was offered support and encouragement. Pt was given scheduled medications. Pt was encourage to attend groups. Q 15 minute checks were done for safety.   R:Pt attends groups and interacts well with peers and staff. Pt is taking medication. Pt has no complaints.Pt receptive to treatment and safety maintained on unit.   Problem: Education: Goal: Emotional status will improve Outcome: Progressing   Problem: Education: Goal: Mental status will improve Outcome: Progressing   Problem: Coping: Goal: Ability to identify and develop effective coping behavior will improve Outcome: Progressing   Problem: Self-Concept: Goal: Will verbalize positive feelings about self Outcome: Progressing

## 2018-01-28 NOTE — Plan of Care (Signed)
Nurse discussed anxiety, depression, coping skills with patient. 

## 2018-01-28 NOTE — Progress Notes (Signed)
D:  [name] self inventory sheet, patient has fair sleep, no sleep medication.   Fair appetite, hyper energy level, good concentration.  Denied depression, hopeless and anxiety.  Denied withdrawals.  Denied SI.  Denied physical problems. Physical pain, L knee playing basketball.  Does have discharge plans.   A:  Medications administered per MD orders.  Emotional support and encouragement given patient. R:  Denied SI and HI, contracts for safety.  Denied A/V hallucinations.  Safety maintained with 15 minute checks.

## 2018-01-28 NOTE — Progress Notes (Signed)
Recreation Therapy Notes  Date: 6.3.19 Time: 1000 Location: 500 Hall Dayroom  Group Topic: Coping Skills  Goal Area(s) Addresses:  Patient will be able to identify positive coping skills. Patient will be able to identify benefits of using coping skills post d/c.  Behavioral Response: Engaged  Intervention: Worksheet, pencils  Activity: Mind map.  LRT and patients filled in the first 8 boxes of the mind map together (guilt, disappointment, sadness, anger, physical harm, depression, anxiety and financial problems).  Patients were to then come up with 3 coping skills for each problem individually before reconvening as a group.  LRT would then fill in the coping skills on the board.   Education: Radiographer, therapeutic, Dentist.   Education Outcome: Acknowledges understanding/In group clarification offered/Needs additional education.   Clinical Observations/Feedback: Pt was engaged and very active during group.  Pt expressed some of her coping skills as open/effective communication, talk to a professional, involve people you trust, listen to music, positive affirmations, write in journal, go for a walk/run,clean, encourage/reward yourself, get help, take medications, go to church, seek counseling, use your resources, talk to financial advisor and save.    Victorino Sparrow, LRT/CTRS     Victorino Sparrow A 01/28/2018 11:54 AM

## 2018-01-28 NOTE — BHH Group Notes (Signed)
LCSW Group Therapy Note   01/28/2018 1:15pm   Type of Therapy and Topic:  Group Therapy:  Overcoming Obstacles   Participation Level:  Active   Description of Group:    In this group patients will be encouraged to explore what they see as obstacles to their own wellness and recovery. They will be guided to discuss their thoughts, feelings, and behaviors related to these obstacles. The group will process together ways to cope with barriers, with attention given to specific choices patients can make. Each patient will be challenged to identify changes they are motivated to make in order to overcome their obstacles. This group will be process-oriented, with patients participating in exploration of their own experiences as well as giving and receiving support and challenge from other group members.   Therapeutic Goals: 1. Patient will identify personal and current obstacles as they relate to admission. 2. Patient will identify barriers that currently interfere with their wellness or overcoming obstacles.  3. Patient will identify feelings, thought process and behaviors related to these barriers. 4. Patient will identify two changes they are willing to make to overcome these obstacles:      Summary of Patient Progress   Stayed the entire time.  Continues to be pressured, tangential, but when structured externally, can focus on on thought or subject for a short period of time.  Identified about half a dozen obstacles , but narrowed it down to "having a sound mind. I got hooked, and I don't know how to get unhooked right now."  Feedback for others-some rlelvent, some not.   Therapeutic Modalities:   Cognitive Behavioral Therapy Solution Focused Therapy Motivational Interviewing Relapse Prevention Therapy  Trish Mage, LCSW 01/28/2018 3:25 PM

## 2018-01-28 NOTE — Plan of Care (Signed)
D: Pt denies SI/HI/AVH. Pt is pleasant and cooperative. Pt visibly responding to internal stimuli at times. Pt labile, less manic and continues with flight of ideas. Pt is disorganized at times but is redirectable.   A: Pt was offered support and encouragement. Pt was given scheduled medications. Pt was encourage to attend groups. Q 15 minute checks were done for safety.   R:Pt attends groups and interacts well with peers and staff. Pt is taking medication. Pt has no complaints.Pt receptive to treatment and safety maintained on unit.   Problem: Education: Goal: Emotional status will improve Outcome: Progressing   Problem: Education: Goal: Mental status will improve Outcome: Progressing   Problem: Coping: Goal: Ability to identify and develop effective coping behavior will improve Outcome: Progressing

## 2018-01-28 NOTE — Progress Notes (Signed)
[facility] MD Progress Note  01/28/2018 4:17 PM [name]  MRN:  [id number]  Subjective: [name] reports, "I have Schizoaffective disorder. I'm feeling a lot better. I do not hear voices but, this arabic talk going on in my head, I wish they could cut it down on the course words. This thing about transgender is getting to me. I feel a little drowsy from taking the Saphris".  History:[name] is a 47 y/o F with history of schizoaffective bipolar type who was admitted voluntarily from [place] where she presented with worsening anxiety, paranoia, delusions that her neighbor thinks she is a police informant, decreased need for sleep, symptoms of mania, and poor medication adherence (pt reports she received Abilify Maintena injection last on 01/15/18, though). Pt has recent history of discharge from [facility] on 11/27/17. Pt was medically cleared in the ED, and then transferred to [facility] for additional treatment and evaluation. She was restarted on previous medications of depakote and oral abilify (Maintena received last on 5/21), and she also was started on trial of saphris to address symptoms of mania, and dose has been titrated up during her stay. Pt has demonstrated incremental improvement of her presenting symptoms.  Today, [name] is seen. Chart reviewed. She is visible on the unit. She is attending group sessions. She is taking & tolerating her medication regimen. Her Depakote level of 01-27-18 results reviewed, therapeutic at 79. Patient denies any AVH, SIHI, however, says she is hearing a lot of arabic talk going in her head. She wishes the arabic talk in her head will cut down on the curse words. She presents some worth disorganized & tangential. However, no disruptive behavior on the unit reported. She complains of mild drowsy from taking Saphris. She agreed to to continue current plan of care already in progress.  Principal Problem: Schizoaffective disorder, bipolar type ([place])  Diagnosis:   Patient Active Problem  List   Diagnosis Date Noted  . Encounter for medical clearance for patient hold [Z00.8]   . Noncompliance with treatment [Z91.19] 07/16/2015  . Schizoaffective disorder, bipolar type ([place]) [F25.0] 01/11/2015   Total Time spent with patient: 15 minutes  Past Psychiatric History: See H&P  Past Medical History:  Past Medical History:  Diagnosis Date  . Anemia   . Anxiety   . Asthma   . Bipolar 1 disorder ([place])   . Breast cancer ([place])   . Depression   . Diabetes mellitus without complication ([place])   . Hypertension   . Insomnia, persistent   . Schizophrenic disorder ([place])   . Seizures ([place])     Past Surgical History:  Procedure Laterality Date  . BREAST SURGERY Left   . TONSILLECTOMY     Family History:  Family History  Problem Relation Age of Onset  . Depression Mother   . Gout Mother   . Cancer Father        prostate  . Other Father        lung issue  . Alcoholism Other   . Heart attack Paternal Grandfather   . Heart attack Paternal Grandmother   . Heart attack Maternal Grandmother   . Heart attack Maternal Grandfather   . Depression Son   . Anxiety disorder Son    Family Psychiatric  History: See H&P  Social History:  Social History   Substance and Sexual Activity  Alcohol Use No     Social History   Substance and Sexual Activity  Drug Use No    Social History  Socioeconomic History  . Marital status: Single    Spouse name: Not on file  . Number of children: Not on file  . Years of education: Not on file  . Highest education level: Not on file  Occupational History  . Not on file  Social Needs  . Financial resource strain: Not on file  . Food insecurity:    Worry: Not on file    Inability: Not on file  . Transportation needs:    Medical: Not on file    Non-medical: Not on file  Tobacco Use  . Smoking status: Former Smoker    Types: Cigarettes  . Smokeless tobacco: Never Used  Substance and Sexual Activity  . Alcohol use: No  . Drug  use: No  . Sexual activity: Never    Birth control/protection: Pill  Lifestyle  . Physical activity:    Days per week: Not on file    Minutes per session: Not on file  . Stress: Not on file  Relationships  . Social connections:    Talks on phone: Not on file    Gets together: Not on file    Attends religious service: Not on file    Active member of club or organization: Not on file    Attends meetings of clubs or organizations: Not on file    Relationship status: Not on file  Other Topics Concern  . Not on file  Social History Narrative  . Not on file   Additional Social History:   Sleep: Fair  Appetite:  Good  Current Medications: Current Facility-Administered Medications  Medication Dose Route Frequency Provider Last Rate Last Dose  . acetaminophen (TYLENOL) tablet 650 mg  650 mg Oral Q6H PRN Ethelene Hal, NP   650 mg at 01/28/18 7371  . albuterol (PROVENTIL HFA;VENTOLIN HFA) 108 (90 Base) MCG/ACT inhaler 2 puff  2 puff Inhalation Q6H PRN Ethelene Hal, NP      . alum & mag hydroxide-simeth (MAALOX/MYLANTA) 200-200-20 MG/5ML suspension 30 mL  30 mL Oral Q4H PRN Ethelene Hal, NP      . ARIPiprazole (ABILIFY) tablet 20 mg  20 mg Oral QHS Ethelene Hal, NP   20 mg at 01/27/18 2152  . asenapine (SAPHRIS) sublingual tablet 10 mg  10 mg Sublingual BID Pennelope Bracken, MD   10 mg at 01/28/18 0819  . benztropine (COGENTIN) tablet 1 mg  1 mg Oral BID PRN Ethelene Hal, NP      . divalproex (DEPAKOTE ER) 24 hr tablet 500 mg  500 mg Oral BID Pennelope Bracken, MD   500 mg at 01/28/18 0820  . hydrochlorothiazide (HYDRODIURIL) tablet 25 mg  25 mg Oral Daily Ethelene Hal, NP   25 mg at 01/28/18 0626  . hydrOXYzine (ATARAX/VISTARIL) tablet 25 mg  25 mg Oral TID PRN Ethelene Hal, NP   25 mg at 01/27/18 2152  . magnesium hydroxide (MILK OF MAGNESIA) suspension 30 mL  30 mL Oral Daily PRN Ethelene Hal, NP       . mometasone-formoterol [facility]) 200-5 MCG/ACT inhaler 2 puff  2 puff Inhalation BID Ethelene Hal, NP   2 puff at 01/28/18 0820  . montelukast (SINGULAIR) tablet 10 mg  10 mg Oral QHS Ethelene Hal, NP   10 mg at 01/27/18 2152  . traZODone (DESYREL) tablet 100 mg  100 mg Oral QHS Ethelene Hal, NP   100 mg at 01/27/18 2152  . ziprasidone (GEODON)  injection 20 mg  20 mg Intramuscular Q12H PRN Ethelene Hal, NP        Lab Results:  Results for orders placed or performed during the hospital encounter of 01/22/18 (from the past 48 hour(s))  Valproic acid level     Status: None   Collection Time: 01/27/18  6:10 AM  Result Value Ref Range   Valproic Acid Lvl 79 50.0 - 100.0 ug/mL    Comment: Performed at [facility], [place] [address] [place], [place] [address]    Blood Alcohol level:  Lab Results  Component Value Date   ETH <10 01/21/2018   ETH <10 42/35/3614    Metabolic Disorder Labs: Lab Results  Component Value Date   HGBA1C 5.0 11/21/2017   MPG 96.8 11/21/2017   MPG 123 01/15/2015   Lab Results  Component Value Date   PROLACTIN 5.4 11/21/2017   Lab Results  Component Value Date   CHOL 180 11/21/2017   TRIG 55 11/21/2017   HDL 54 11/21/2017   CHOLHDL 3.3 11/21/2017   VLDL 11 11/21/2017   LDLCALC 115 (H) 11/21/2017   LDLCALC 111 (H) 01/15/2015   Physical Findings: AIMS: Facial and Oral Movements Muscles of Facial Expression: None, normal Lips and Perioral Area: None, normal Jaw: None, normal Tongue: None, normal,Extremity Movements Upper (arms, wrists, hands, fingers): None, normal Lower (legs, knees, ankles, toes): None, normal, Trunk Movements Neck, shoulders, hips: None, normal, Overall Severity Severity of abnormal movements (highest score from questions above): None, normal Incapacitation due to abnormal movements: None, normal [name] awareness of abnormal movements (rate only patient's report): No  Awareness, Dental Status Current problems with teeth and/or dentures?: No Does patient usually wear dentures?: No  CIWA:    COWS:  COWS Total Score: 1  Musculoskeletal: Strength & Muscle Tone: within normal limits Gait & Station: normal Patient leans: N/A  Psychiatric Specialty Exam: Physical Exam  Nursing note and vitals reviewed. Constitutional: She appears well-developed.  Neurological: She is alert.  Psychiatric: She has a normal mood and affect. Her behavior is normal.    Review of Systems  Constitutional: Negative for chills.  Psychiatric/Behavioral: Positive for hallucinations. Negative for depression and suicidal ideas. The patient is not nervous/anxious and does not have insomnia.   All other systems reviewed and are negative.   Blood pressure 103/79, pulse 79, temperature 98.7 F (37.1 C), temperature source Oral, resp. rate 16, height 5\' 6"  (1.676 m), weight 93.9 kg (207 lb), last menstrual period 12/23/2017, SpO2 100 %.Body mass index is 33.41 kg/m.  General Appearance: Casual and Fairly Groomed  Eye Contact:  Good  Speech:  Clear and Coherent and Pressured- improving   Volume:  Normal  Mood:  Euthymic  Affect:  Appropriate, Blunt and Congruent  Thought Process:  Coherent, Goal Directed and Descriptions of Associations: Tangential  Orientation:  Full (Time, Place, and Person)  Thought Content:  Hallucinations: Auditory  Suicidal Thoughts:  No  Homicidal Thoughts:  No  Memory:  Immediate;   Fair Recent;   Fair Remote;   Fair  Judgement:  Fair  Insight:  Fair  Psychomotor Activity:  Normal  Concentration:  Concentration: Fair  Recall:  [employer] of Knowledge:  Fair  Language:  Fair  Akathisia:  No  Handed:    AIMS (if indicated):     Assets:  Resilience Social Support  ADL's:  Intact  Cognition:  WNL  Sleep:  Number of Hours: 6.5   Treatment Plan Summary: Daily contact with  patient to assess and evaluate symptoms and progress in treatment and  Medication management   - Continue inpatient hospitalization.  - Will continue today 01/28/2018 plan as below except where it is noted.  -Schizoaffective disorder, bipolar type:             -Continue abilify Maintena 400mg  IM q28 days (last given on 5/21/1()             -Continue abilify 20mg  po qDay             -Continue Depakote ER 500mg  po BID             -Depakote level on 01/27/18-results reviewed, therapeutic at 31.             -Continue Saphris 10 mg SL BID  -COPD             -Continue albuterol 108 MCG/ACT take 2 puffs q6h prn wheezing/SOB             -Continue Dulera 200-5 MCG/ACT take 2 puffs BID             -Continue singulair 10mg  po qhs  -Anxiety             -Continue vistaril 25mg  po TID prn anxiety  -EPS             -Continue Cogentin 1mg  po BID prn EPS  -HTN             -Continue HCTZ 25mg  po qDay  -Agitation             -Continue geodon 20mg  IM q12h prn agitation  -Insomnia             -Continue trazodone 100mg  po qhs  -Encourage participation in groups and therapeutic milieu.  -Disposition planning will be ongoing  Yvette Spar, NP, PMHNP, FNP-BC 01/28/2018, 4:17 PMPatient ID: [name], female   DOB: 1971-03-27, 47 y.o.   MRN: [id number]

## 2018-01-29 NOTE — Progress Notes (Signed)
D:  [name] self inventory sheet, patient has fair sleep, no sleep medication given.  Fair appetite, hyper energy level, poor concentration.  Denied depression, denied hopeless, rated anxiety 9.  Denied withdrawals.  Denied SI.  Has experienced pain in the past 24 hours, lightheadedness, pain, dizziness, headache, rash, blurred vision.  Physical pain, patient has declined  pain med today.  No goal at this time.  Plans to discharge soon.  Does have discharge plans. A:  Medications administered per MD orders..  Medications administered per MD orders. R:  Patient denied SI and HI, contracts for safety.  Denied A/V hallucinations.

## 2018-01-29 NOTE — Progress Notes (Signed)
Recreation Therapy Notes  Date: 6.4.19 Time: 1000 Location: 500 Hall  Group Topic: Communication, Team Building, Problem Solving  Goal Area(s) Addresses:  Patient will effectively work with peer towards shared goal.  Patient will identify skill used to make activity successful.  Patient will identify how skills used during activity can be used to reach post d/c goals.   Behavioral Response: Engaged  Intervention: Trust Activity  Activity:  Patients were given rubber discs (one more than the number of people doing the activity).  Patients were to use the rubber discs to maneuver from one end of the hall to the other and back to the starting point.  Patients were to remain on their disc at all times, if anyone stepped off, the group would have to start over from the beginning.   Education: Education officer, community, Dentist.   Education Outcome: Acknowledges education/In group clarification offered/Needs additional education.   Clinical Observations/Feedback: Pt was actively engaged during activity.  Pt expressed that gaging the distance of the discs was challenging.  Pt stated the group had to trust each other and be patient with each other.  Pt also expressed that with your support system "you can't be afraid of the help they give you".    Victorino Sparrow, LRT/CTRS     Ria Comment, Aydan Levitz A 01/29/2018 11:25 AM

## 2018-01-29 NOTE — Progress Notes (Signed)
[name] MD Progress Note  01/29/2018 1:40 PM [name]  MRN:  [id number] Subjective:    [name] is a 47 y/o F with history of schizoaffective bipolar type who was admitted voluntarily from [place] where she presented with worsening anxiety, paranoia, delusions that her neighbor thinks she is a police informant, decreased need for sleep, symptoms of mania, and poor medication adherence (pt reports she received Abilify Maintena injection last on 01/15/18, though). Pt has recent history of discharge from [facility] on 11/27/17. Pt was medically cleared in the ED, and then transferred to [facility] for additional treatment and evaluation.She was restarted on previous medications of depakote and oral abilify (Maintena received last on 5/21), and she also was started on trial of saphris to address symptoms of mania, and dose has been titrated up during her stay. Pt has demonstrated incremental improvement of her presenting symptoms.  Today upon evaluation, pt remains mildly pressured and tangential, but overall she has significantly improved during her stay and compared to earlier interviews. She shares, "I'm good. I've been thinking a bit about my uncle that passed about 2 weeks ago, but I'm doing okay." Pt shares that overall she is doing well, and her main concern is being discharged so that she can pay her rent. She denies physical complaints. Her appetite is good. She is sleeping well. She denies SI/HI/AH/VH. She is tolerating her medications well, and she is in agreement to continue her current medications without changes. Discussed with patient that we will tentatively plan for her to discharge to home tomorrow if she has ongoing stability of her mood symptoms, and pt was in agreement with the above plan. She had no further questions, comments, or concerns.   Principal Problem: Schizoaffective disorder, bipolar type ([place]) Diagnosis:   Patient Active Problem List   Diagnosis Date Noted  . Encounter for medical  clearance for patient hold [Z00.8]   . Noncompliance with treatment [Z91.19] 07/16/2015  . Schizoaffective disorder, bipolar type ([place]) [F25.0] 01/11/2015   Total Time spent with patient: 30 minutes  Past Psychiatric History: see H&P  Past Medical History:  Past Medical History:  Diagnosis Date  . Anemia   . Anxiety   . Asthma   . Bipolar 1 disorder ([place])   . Breast cancer ([place])   . Depression   . Diabetes mellitus without complication ([place])   . Hypertension   . Insomnia, persistent   . Schizophrenic disorder ([place])   . Seizures ([place])     Past Surgical History:  Procedure Laterality Date  . BREAST SURGERY Left   . TONSILLECTOMY     Family History:  Family History  Problem Relation Age of Onset  . Depression Mother   . Gout Mother   . Cancer Father        prostate  . Other Father        lung issue  . Alcoholism Other   . Heart attack Paternal Grandfather   . Heart attack Paternal Grandmother   . Heart attack Maternal Grandmother   . Heart attack Maternal Grandfather   . Depression Son   . Anxiety disorder Son    Family Psychiatric  History: see H&P Social History:  Social History   Substance and Sexual Activity  Alcohol Use No     Social History   Substance and Sexual Activity  Drug Use No    Social History   Socioeconomic History  . Marital status: Single    Spouse name: Not on file  .  Number of children: Not on file  . Years of education: Not on file  . Highest education level: Not on file  Occupational History  . Not on file  Social Needs  . Financial resource strain: Not on file  . Food insecurity:    Worry: Not on file    Inability: Not on file  . Transportation needs:    Medical: Not on file    Non-medical: Not on file  Tobacco Use  . Smoking status: Former Smoker    Types: Cigarettes  . Smokeless tobacco: Never Used  Substance and Sexual Activity  . Alcohol use: No  . Drug use: No  . Sexual activity: Never    Birth  control/protection: Pill  Lifestyle  . Physical activity:    Days per week: Not on file    Minutes per session: Not on file  . Stress: Not on file  Relationships  . Social connections:    Talks on phone: Not on file    Gets together: Not on file    Attends religious service: Not on file    Active member of club or organization: Not on file    Attends meetings of clubs or organizations: Not on file    Relationship status: Not on file  Other Topics Concern  . Not on file  Social History Narrative  . Not on file   Additional Social History:                         Sleep: Good  Appetite:  Good  Current Medications: Current Facility-Administered Medications  Medication Dose Route Frequency Provider Last Rate Last Dose  . acetaminophen (TYLENOL) tablet 650 mg  650 mg Oral Q6H PRN Ethelene Hal, NP   650 mg at 01/28/18 7169  . albuterol (PROVENTIL HFA;VENTOLIN HFA) 108 (90 Base) MCG/ACT inhaler 2 puff  2 puff Inhalation Q6H PRN Ethelene Hal, NP      . alum & mag hydroxide-simeth (MAALOX/MYLANTA) 200-200-20 MG/5ML suspension 30 mL  30 mL Oral Q4H PRN Ethelene Hal, NP      . ARIPiprazole (ABILIFY) tablet 20 mg  20 mg Oral QHS Ethelene Hal, NP   20 mg at 01/28/18 2122  . asenapine (SAPHRIS) sublingual tablet 10 mg  10 mg Sublingual BID Pennelope Bracken, MD   10 mg at 01/29/18 0820  . benztropine (COGENTIN) tablet 1 mg  1 mg Oral BID PRN Ethelene Hal, NP      . divalproex (DEPAKOTE ER) 24 hr tablet 500 mg  500 mg Oral BID Pennelope Bracken, MD   500 mg at 01/29/18 6789  . hydrochlorothiazide (HYDRODIURIL) tablet 25 mg  25 mg Oral Daily Ethelene Hal, NP   25 mg at 01/29/18 3810  . hydrOXYzine (ATARAX/VISTARIL) tablet 25 mg  25 mg Oral TID PRN Ethelene Hal, NP   25 mg at 01/28/18 2122  . magnesium hydroxide (MILK OF MAGNESIA) suspension 30 mL  30 mL Oral Daily PRN Ethelene Hal, NP      .  mometasone-formoterol [facility]) 200-5 MCG/ACT inhaler 2 puff  2 puff Inhalation BID Ethelene Hal, NP   2 puff at 01/29/18 0820  . montelukast (SINGULAIR) tablet 10 mg  10 mg Oral QHS Ethelene Hal, NP   10 mg at 01/28/18 2122  . traZODone (DESYREL) tablet 100 mg  100 mg Oral QHS Ethelene Hal, NP   100 mg at 01/28/18  2122  . ziprasidone (GEODON) injection 20 mg  20 mg Intramuscular Q12H PRN Ethelene Hal, NP        Lab Results: No results found for this or any previous visit (from the past 44 hour(s)).  Blood Alcohol level:  Lab Results  Component Value Date   ETH <10 01/21/2018   ETH <10 74/16/3845    Metabolic Disorder Labs: Lab Results  Component Value Date   HGBA1C 5.0 11/21/2017   MPG 96.8 11/21/2017   MPG 123 01/15/2015   Lab Results  Component Value Date   PROLACTIN 5.4 11/21/2017   Lab Results  Component Value Date   CHOL 180 11/21/2017   TRIG 55 11/21/2017   HDL 54 11/21/2017   CHOLHDL 3.3 11/21/2017   VLDL 11 11/21/2017   LDLCALC 115 (H) 11/21/2017   LDLCALC 111 (H) 01/15/2015    Physical Findings: AIMS: Facial and Oral Movements Muscles of Facial Expression: None, normal Lips and Perioral Area: None, normal Jaw: None, normal Tongue: None, normal,Extremity Movements Upper (arms, wrists, hands, fingers): None, normal Lower (legs, knees, ankles, toes): None, normal, Trunk Movements Neck, shoulders, hips: None, normal, Overall Severity Severity of abnormal movements (highest score from questions above): None, normal Incapacitation due to abnormal movements: None, normal [name] awareness of abnormal movements (rate only patient's report): No Awareness, Dental Status Current problems with teeth and/or dentures?: No Does patient usually wear dentures?: No  CIWA:  CIWA-Ar Total: 1 COWS:  COWS Total Score: 2  Musculoskeletal: Strength & Muscle Tone: within normal limits Gait & Station: normal Patient leans:  N/A  Psychiatric Specialty Exam: Physical Exam  Nursing note and vitals reviewed.   Review of Systems  Constitutional: Negative for chills and fever.  Respiratory: Negative for cough and shortness of breath.   Cardiovascular: Negative for chest pain.  Gastrointestinal: Negative for abdominal pain, heartburn, nausea and vomiting.  Psychiatric/Behavioral: Negative for depression, hallucinations and suicidal ideas. The patient is not nervous/anxious and does not have insomnia.     Blood pressure 124/90, pulse 94, temperature 98.2 F (36.8 C), temperature source Oral, resp. rate 20, height 5\' 6"  (1.676 m), weight 93.9 kg (207 lb), last menstrual period 12/23/2017, SpO2 100 %.Body mass index is 33.41 kg/m.  General Appearance: Casual and Fairly Groomed  Eye Contact:  Good  Speech:  Clear and Coherent and Normal Rate  Volume:  Normal  Mood:  Euthymic  Affect:  Appropriate and Congruent  Thought Process:  Coherent and Goal Directed  Orientation:  Full (Time, Place, and Person)  Thought Content:  Logical  Suicidal Thoughts:  No  Homicidal Thoughts:  No  Memory:  Immediate;   Fair Recent;   Fair Remote;   Fair  Judgement:  Fair  Insight:  Fair  Psychomotor Activity:  Normal  Concentration:  Concentration: Fair  Recall:  [employer] of Knowledge:  Fair  Language:  Fair  Akathisia:  No  Handed:    AIMS (if indicated):     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  -Schizoaffective disorder, bipolar type: -Continue abilify Maintena 400mg  IM q28 days (last given on 01/15/18) -Continue abilify 20mg  po qDay -Continue Depakote ER 500mg  po BID -Depakote level on  01/27/18-results reviewed, therapeutic at 65. -Continue  Saphris 10 mg SL BID  -COPD -Continue albuterol 108 MCG/ACT take 2 puffs q6h prn wheezing/SOB -Continue Dulera 200-5 MCG/ACT take 2 puffs BID -Continue singulair 10mg  po qhs  -Anxiety -Continue vistaril 25mg  po TID prn anxiety  -EPS -Continue Cogentin 1mg  po BID prn EPS  -HTN -Continue HCTZ 25mg  po qDay  -Agitation -Continue geodon 20mg  IM q12h prn agitation  -Insomnia -Continue trazodone 100mg  po qhs  -Encourage participation in groups and therapeutic milieu.  -Disposition planning will be ongoing  Pennelope Bracken, MD 01/29/2018, 1:40 PM

## 2018-01-29 NOTE — Plan of Care (Signed)
Nurse discussed anxiety, depression, coping skills with patient. 

## 2018-01-29 NOTE — Progress Notes (Signed)
Did not attend group 

## 2018-01-29 NOTE — [facility] Group Notes (Signed)
LCSW Group Therapy Note  01/29/2018 1:15pm  Type of Therapy/Topic:  Group Therapy:  Feelings about Diagnosis  Participation Level:  Active   Description of Group:   This group will allow patients to explore their thoughts and feelings about diagnoses they have received. Patients will be guided to explore their level of understanding and acceptance of these diagnoses. Facilitator will encourage patients to process their thoughts and feelings about the reactions of others to their diagnosis and will guide patients in identifying ways to discuss their diagnosis with significant others in their lives. This group will be process-oriented, with patients participating in exploration of their own experiences, giving and receiving support, and processing challenge from other group members.   Therapeutic Goals: 1. Patient will demonstrate understanding of diagnosis as evidenced by identifying two or more symptoms of the disorder 2. Patient will be able to express two feelings regarding the diagnosis 3. Patient will demonstrate their ability to communicate their needs through discussion and/or role play  Summary of Patient Progress:  Stayed the entire time, engaged throughout.  Remains tangential, free flowing, less intrusive.     Therapeutic Modalities:   Cognitive Behavioral Therapy Brief Therapy Feelings Identification    Trish Mage, LCSW 01/29/2018 2:01 PM

## 2018-01-29 NOTE — Progress Notes (Signed)
Pt in room in bed.  Sts she is just tired and wants to sleep.  Pt is friendly, polite and cooperative and med compliant.  Pt does not attend group.  Pt does agree to have a roommate.  Pt denies pain or discomfort.  Pt denies SI, HI and AVH.  Pt contracts for safety.  Pt given snack and returns to bed. Pt remains safe on unit.

## 2018-01-29 NOTE — Tx Team (Signed)
Interdisciplinary Treatment and Diagnostic Plan Update  01/29/2018 Time of Session: 2:03 PM  Shron Guedes MRN: [id number]  Principal Diagnosis: Schizoaffective disorder, bipolar type (HCC)  Secondary Diagnoses: Principal Problem:   Schizoaffective disorder, bipolar type (HCC)   Current Medications:  Current Facility-Administered Medications  Medication Dose Route Frequency Provider Last Rate Last Dose  . acetaminophen (TYLENOL) tablet 650 mg  650 mg Oral Q6H PRN Parks, Laurie Britton, NP   650 mg at 01/28/18 0822  . albuterol (PROVENTIL HFA;VENTOLIN HFA) 108 (90 Base) MCG/ACT inhaler 2 puff  2 puff Inhalation Q6H PRN Parks, Laurie Britton, NP      . alum & mag hydroxide-simeth (MAALOX/MYLANTA) 200-200-20 MG/5ML suspension 30 mL  30 mL Oral Q4H PRN Parks, Laurie Britton, NP      . ARIPiprazole (ABILIFY) tablet 20 mg  20 mg Oral QHS Parks, Laurie Britton, NP   20 mg at 01/28/18 2122  . asenapine (SAPHRIS) sublingual tablet 10 mg  10 mg Sublingual BID [place], Christopher T, MD   10 mg at 01/29/18 0820  . benztropine (COGENTIN) tablet 1 mg  1 mg Oral BID PRN Parks, Laurie Britton, NP      . divalproex (DEPAKOTE ER) 24 hr tablet 500 mg  500 mg Oral BID [place], Christopher T, MD   500 mg at 01/29/18 0821  . hydrochlorothiazide (HYDRODIURIL) tablet 25 mg  25 mg Oral Daily Parks, Laurie Britton, NP   25 mg at 01/29/18 0821  . hydrOXYzine (ATARAX/VISTARIL) tablet 25 mg  25 mg Oral TID PRN Parks, Laurie Britton, NP   25 mg at 01/28/18 2122  . magnesium hydroxide (MILK OF MAGNESIA) suspension 30 mL  30 mL Oral Daily PRN Parks, Laurie Britton, NP      . mometasone-formoterol (DULERA) 200-5 MCG/ACT inhaler 2 puff  2 puff Inhalation BID Parks, Laurie Britton, NP   2 puff at 01/29/18 0820  . montelukast (SINGULAIR) tablet 10 mg  10 mg Oral QHS Parks, Laurie Britton, NP   10 mg at 01/28/18 2122  . traZODone (DESYREL) tablet 100 mg  100 mg Oral QHS Parks, Laurie Britton, NP   100 mg at 01/28/18 2122   . ziprasidone (GEODON) injection 20 mg  20 mg Intramuscular Q12H PRN Parks, Laurie Britton, NP        PTA Medications: Medications Prior to Admission  Medication Sig Dispense Refill Last Dose  . albuterol (PROVENTIL HFA;VENTOLIN HFA) 108 (90 Base) MCG/ACT inhaler Inhale 2 puffs into the lungs every 6 (six) hours as needed for wheezing or shortness of breath.   Past Month at Unknown time  . ARIPiprazole (ABILIFY) 20 MG tablet Take 1 tablet (20 mg total) by mouth at bedtime. 30 tablet 0 08/15/2017 at Unknown time  . ARIPiprazole (ABILIFY) 20 MG tablet Take 1 tablet (20 mg total) by mouth at bedtime. 14 tablet 0 Past Week at Unknown time  . benztropine (COGENTIN) 1 MG tablet Take 1 tablet (1 mg total) by mouth 2 (two) times daily as needed (EPS). 30 tablet 0 unknown  . budesonide-formoterol (SYMBICORT) 160-4.5 MCG/ACT inhaler Inhale 2 puffs into the lungs 2 (two) times daily. For Asthma 1 Inhaler 12 unknown  . divalproex (DEPAKOTE ER) 500 MG 24 hr tablet Take 1 tablet (500 mg total) by mouth 2 (two) times daily. 60 tablet 0 11/29/2017 at Unknown time  . hydrochlorothiazide (HYDRODIURIL) 25 MG tablet Take 1 tablet (25 mg total) by mouth daily. 30 tablet 0 unknown  . montelukast (SINGULAIR) 10 MG tablet Take 10 mg   by mouth at bedtime.   unknown  . potassium chloride (K-DUR) 10 MEQ tablet Take 1 tablet (10 mEq total) by mouth daily. 20 tablet 0 unknown  . traZODone (DESYREL) 100 MG tablet Take 1 tablet (100 mg total) by mouth at bedtime. 30 tablet 0 unknown    Patient Stressors: Financial difficulties Health problems Medication change or noncompliance  Patient Strengths: Ability for insight Average or above average intelligence Capable of independent living Communication skills  Treatment Modalities: Medication Management, Group therapy, Case management,  1 to 1 session with clinician, Psychoeducation, Recreational therapy.   Physician Treatment Plan for Primary Diagnosis: Schizoaffective  disorder, bipolar type ([place]) Long Term Goal(s): Improvement in symptoms so as ready for discharge  Short Term Goals: Ability to demonstrate self-control will improve Ability to identify and develop effective coping behaviors will improve  Medication Management: Evaluate patient's response, side effects, and tolerance of medication regimen.  Therapeutic Interventions: 1 to 1 sessions, Unit Group sessions and Medication administration.  Evaluation of Outcomes: Adequate for Discharge  Physician Treatment Plan for Secondary Diagnosis: Principal Problem:   Schizoaffective disorder, bipolar type ([place])   Long Term Goal(s): Improvement in symptoms so as ready for discharge  Short Term Goals: Ability to demonstrate self-control will improve Ability to identify and develop effective coping behaviors will improve  Medication Management: Evaluate patient's response, side effects, and tolerance of medication regimen.  Therapeutic Interventions: 1 to 1 sessions, Unit Group sessions and Medication administration.  Evaluation of Outcomes: Adequate for Discharge   RN Treatment Plan for Primary Diagnosis: Schizoaffective disorder, bipolar type ([place]) Long Term Goal(s): Knowledge of disease and therapeutic regimen to maintain health will improve  Short Term Goals: Ability to identify and develop effective coping behaviors will improve and Compliance with prescribed medications will improve  Medication Management: RN will administer medications as ordered by provider, will assess and evaluate patient's response and provide education to patient for prescribed medication. RN will report any adverse and/or side effects to prescribing provider.  Therapeutic Interventions: 1 on 1 counseling sessions, Psychoeducation, Medication administration, Evaluate responses to treatment, Monitor vital signs and CBGs as ordered, Perform/monitor CIWA, COWS, AIMS and Fall Risk screenings as ordered, Perform wound care  treatments as ordered.  Evaluation of Outcomes: Adequate for Discharge   LCSW Treatment Plan for Primary Diagnosis: Schizoaffective disorder, bipolar type ([place]) Long Term Goal(s): Safe transition to appropriate next level of care at discharge, Engage patient in therapeutic group addressing interpersonal concerns.  Short Term Goals: Engage patient in aftercare planning with referrals and resources  Therapeutic Interventions: Assess for all discharge needs, 1 to 1 time with Social worker, Explore available resources and support systems, Assess for adequacy in community support network, Educate family and significant other(s) on suicide prevention, Complete Psychosocial Assessment, Interpersonal group therapy.  Evaluation of Outcomes: Met  Return home, follow up [place] in Treatment: Attending groups: Yes Participating in groups: Yes Taking medication as prescribed: Yes Toleration medication: Yes, no side effects reported at this time Family/Significant other contact made: Yes Patient understands diagnosis: No Limited insight Discussing patient identified problems/goals with staff: Yes Medical problems stabilized or resolved: Yes Denies suicidal/homicidal ideation: Yes Issues/concerns per patient self-inventory: None Other: N/A  New problem(s) identified: None identified at this time.   New Short Term/Long Term Goal(s): "I want to keep my independence".   Discharge Plan or Barriers:   Reason for Continuation of Hospitalization: Hypo-mainia  Medication stabilization   Estimated Length of Stay: Likely d/c tomorrow  Attendees: Patient:  01/29/2018  2:03 PM  Physician: Maris Berger, MD 01/29/2018  2:03 PM  Nursing: Grayland Ormond, RN 01/29/2018  2:03 PM  RN Care Manager: Lars Pinks, RN 01/29/2018  2:03 PM  Social Worker: Ripley Fraise 01/29/2018  2:03 PM  Recreational Therapist: Winfield Cunas 01/29/2018  2:03 PM  Other: Norberto Sorenson 01/29/2018  2:03 PM   Other:  01/29/2018  2:03 PM    Scribe for Treatment Team:  Roque Lias LCSW 01/29/2018 2:03 PM

## 2018-01-30 MED ORDER — ASENAPINE MALEATE 5 MG SL SUBL: 10.0000 mg | SUBLINGUAL_TABLET | Freq: Two times a day (BID) | SUBLINGUAL | 0 refills | Status: DC

## 2018-01-30 MED ORDER — BUDESONIDE-FORMOTEROL FUMARATE 160-4.5 MCG/ACT IN AERO: 2.0000 | INHALATION_SPRAY | Freq: Two times a day (BID) | RESPIRATORY_TRACT | 0 refills | Status: DC

## 2018-01-30 MED ORDER — ARIPIPRAZOLE 20 MG PO TABS: 20.0000 mg | ORAL_TABLET | Freq: Every day | ORAL | 0 refills | Status: DC

## 2018-01-30 MED ORDER — HYDROCHLOROTHIAZIDE 25 MG PO TABS: 25.0000 mg | ORAL_TABLET | Freq: Every day | ORAL | 0 refills | Status: DC

## 2018-01-30 MED ORDER — POTASSIUM CHLORIDE ER 10 MEQ PO TBCR: 10.0000 meq | EXTENDED_RELEASE_TABLET | Freq: Every day | ORAL | 0 refills | Status: DC

## 2018-01-30 MED ORDER — DIVALPROEX SODIUM ER 500 MG PO TB24: 500.0000 mg | ORAL_TABLET | Freq: Two times a day (BID) | ORAL | 0 refills | Status: DC

## 2018-01-30 MED ORDER — HYDROXYZINE HCL 25 MG PO TABS: 25.0000 mg | ORAL_TABLET | Freq: Three times a day (TID) | ORAL | 0 refills | Status: DC | PRN

## 2018-01-30 MED ORDER — ALBUTEROL SULFATE HFA 108 (90 BASE) MCG/ACT IN AERS: 2.0000 | INHALATION_SPRAY | Freq: Four times a day (QID) | RESPIRATORY_TRACT | 0 refills | Status: DC | PRN

## 2018-01-30 MED ORDER — BENZTROPINE MESYLATE 1 MG PO TABS: 1.0000 mg | ORAL_TABLET | Freq: Two times a day (BID) | ORAL | 0 refills | Status: DC | PRN

## 2018-01-30 MED ORDER — MOMETASONE FURO-FORMOTEROL FUM 200-5 MCG/ACT IN AERO: 2.0000 | INHALATION_SPRAY | Freq: Two times a day (BID) | RESPIRATORY_TRACT | 0 refills | Status: DC

## 2018-01-30 MED ORDER — TRAZODONE HCL 100 MG PO TABS: 100.0000 mg | ORAL_TABLET | Freq: Every day | ORAL | 0 refills | Status: DC

## 2018-01-30 MED ORDER — MONTELUKAST SODIUM 10 MG PO TABS: 10.0000 mg | ORAL_TABLET | Freq: Every day | ORAL | 0 refills | Status: DC

## 2018-01-30 MED ORDER — POTASSIUM CHLORIDE CRYS ER 10 MEQ PO TBCR
10.0000 meq | EXTENDED_RELEASE_TABLET | Freq: Every day | ORAL | Status: DC
  Filled 2018-01-30: qty 7

## 2018-01-30 NOTE — Progress Notes (Signed)
  [facility] Adult Case Management Discharge Plan :  Will you be returning to the same living situation after discharge:  Yes,  home At discharge, do you have transportation home?: Yes,  father Do you have the ability to pay for your medications: Yes,  insurance  Release of information consent forms completed and in the chart;  [name] signature needed at discharge.  Patient to Follow up at: Follow-up Information    [facility] Go on 02/04/2018.   Specialty:  Family Medicine Why:  Please attend your hospital discharge appt on Monday, 02/04/18, at 10:15AM.  Please attend your therapy appt on Tuesday, 02/12/18 at 10:30AM. Contact information: [address] [place] [address] [phone]           Next level of care provider has access to [place] and Suicide Prevention discussed: Yes,  yes  Have you used any form of tobacco in the last 30 days? (Cigarettes, Smokeless Tobacco, Cigars, and/or Pipes): No  Has patient been referred to the Quitline?: N/A patient is not a smoker  Patient has been referred for addiction treatment: [place], LCSW 01/30/2018, 8:50 AM

## 2018-01-30 NOTE — Progress Notes (Signed)
Recreation Therapy Notes  Date: 6.5.19 Time: 1000 Location: 500 Hall Dayroom  Group Topic: Wellness  Goal Area(s) Addresses:  Patient will define components of whole wellness. Patient will verbalize benefit of whole wellness.  Behavioral Response: Engaged  Intervention:  Exercise     Activity: LRT and tech [employer]) lead patients in a series of stretches before allowing each patient to lead the group in an exercise of their choice.    Education: Wellness, Dentist.   Education Outcome: Acknowledges education/In group clarification offered/Needs additional education.   Clinical Observations/Feedback: Pt was bright and active throughout activity. Pt completed the exercises and expressed she felt good at completion of group.     Victorino Sparrow, LRT/CTRS       Ria Comment, Xabi Wittler A 01/30/2018 11:23 AM

## 2018-01-30 NOTE — Progress Notes (Signed)
Patient ID: [name], female   DOB: 20-Dec-1970, 47 y.o.   MRN: [id number] Patient discharged to home/self care in the presence of family.  Patient denies SI, Hi and AVH and talked about her plans to continue care in an outpatient setting.

## 2018-01-30 NOTE — Plan of Care (Signed)
Pt was able to identify benefits of improved communication at completion of recreation therapy sessions.   Victorino Sparrow, LRT/CTRS

## 2018-01-30 NOTE — Progress Notes (Signed)
Recreation Therapy Notes  INPATIENT RECREATION TR PLAN  Patient Details Name: [name] MRN: [id number] DOB: 1971/08/13 Today's Date: 01/30/2018  Rec Therapy Plan Is patient appropriate for Therapeutic Recreation?: Yes Treatment times per week: about 3 days Estimated Length of Stay: 5-7 days TR Treatment/Interventions: Group participation (Comment)  Discharge Criteria Pt will be discharged from therapy if:: Discharged Treatment plan/goals/alternatives discussed and agreed upon by:: Patient/family  Discharge Summary Short term goals set: See patient care plan Short term goals met: Complete Progress toward goals comments: Groups attended Which groups?: Wellness, Coping skills, Other (Comment)(Teamwork; Trust) Reason goals not met: None Therapeutic equipment acquired: N/A Reason patient discharged from therapy: Discharge from hospital Pt/family agrees with progress & goals achieved: Yes Date patient discharged from therapy: 01/30/18     Victorino Sparrow, LRT/CTRS   Ria Comment, Calista Crain A 01/30/2018, 11:35 AM

## 2018-01-30 NOTE — [facility] Suicide Risk Assessment (Signed)
[facility] Discharge Suicide Risk Assessment   Principal Problem: Schizoaffective disorder, bipolar type [facility]) Discharge Diagnoses:  Patient Active Problem List   Diagnosis Date Noted  . Encounter for medical clearance for patient hold [Z00.8]   . Noncompliance with treatment [Z91.19] 07/16/2015  . Schizoaffective disorder, bipolar type ([place]) [F25.0] 01/11/2015    Total Time spent with patient: 30 minutes  Musculoskeletal: Strength & Muscle Tone: within normal limits Gait & Station: normal Patient leans: N/A  Psychiatric Specialty Exam: Review of Systems  Constitutional: Negative for chills and fever.  Respiratory: Negative for cough and shortness of breath.   Cardiovascular: Negative for chest pain.  Gastrointestinal: Negative for abdominal pain, heartburn, nausea and vomiting.  Psychiatric/Behavioral: Negative for depression, hallucinations and suicidal ideas. The patient is not nervous/anxious and does not have insomnia.     Blood pressure (!) 118/92, pulse (!) 101, temperature 98.5 F (36.9 C), resp. rate 20, height 5\' 6"  (1.676 m), weight 93.9 kg (207 lb), last menstrual period 12/23/2017, SpO2 100 %.Body mass index is 33.41 kg/m.  General Appearance: Casual and Fairly Groomed  Engineer, water::  Good  Speech:  Clear and Coherent and Normal Rate  Volume:  Normal  Mood:  Euthymic  Affect:  Appropriate and Congruent  Thought Process:  Coherent and Goal Directed  Orientation:  Full (Time, Place, and Person)  Thought Content:  Logical  Suicidal Thoughts:  No  Homicidal Thoughts:  No  Memory:  Immediate;   Fair Recent;   Fair Remote;   Fair  Judgement:  Fair  Insight:  Fair  Psychomotor Activity:  Normal  Concentration:  Good  Recall:  [employer] of Knowledge:Good  Language: Fair  Akathisia:  No  Handed:    AIMS (if indicated):     Assets:  Communication Skills Resilience Social Support  Sleep:  Number of Hours: 6.25  Cognition: WNL  ADL's:  Intact   Mental Status  Per Nursing Assessment::   On Admission:  NA  Demographic Factors:  Low socioeconomic status, Living alone and Unemployed  Loss Factors: Financial problems/change in socioeconomic status  Historical Factors: Impulsivity  Risk Reduction Factors:   Sense of responsibility to family, Positive social support, Positive therapeutic relationship and Positive coping skills or problem solving skills  Continued Clinical Symptoms:  Bipolar Disorder:   Mixed State Schizophrenia:   Paranoid or undifferentiated type More than one psychiatric diagnosis Previous Psychiatric Diagnoses and Treatments Medical Diagnoses and Treatments/Surgeries  Cognitive Features That Contribute To Risk:  None    Suicide Risk:  Minimal: No identifiable suicidal ideation.  Patients presenting with no risk factors but with morbid ruminations; may be classified as minimal risk based on the severity of the depressive symptoms  Follow-up Information    [facility] Go on 02/04/2018.   Specialty:  Family Medicine Why:  Please attend your hospital discharge appt on Monday, 02/04/18, at 10:15AM.  Please attend your therapy appt on Tuesday, 02/12/18 at 10:30AM. Contact information: [address] [address] [phone]         Subjective Data: [name] is a 47 y/o F with history of schizoaffective bipolar type who was admitted voluntarily from [place] where she presented with worsening anxiety, paranoia, delusions that her neighbor thinks she is a police informant, decreased need for sleep, symptoms of mania, and poor medication adherence (pt reports she received Abilify Maintena injection last on 01/15/18, though). Pt has recent history of discharge from [facility] on 11/27/17.  Pt was medically cleared in the ED, and then transferred to [facility] for additional treatment and evaluation.She was restarted on previous medications of depakote and oral abilify (Maintena received last on  5/21), and she also was started on trial of saphris to address symptoms of mania, and dose was titrated up during her stay. Pt has demonstratedincremental improvement of her presenting symptoms.  Today upon evaluation, pt has improvement of pressured speech, tangentiality, and flight of ideas. She is calm, polite, pleasant, and cooperative with the interview. She shares, "I'm doing good." She denies any specific concerns. She is sleeping well. Her appetite is good. She denies other physical complaints. She denies SI/HI/AH/VH. She is tolerating her current treatment regimen well, and she feels it has been helpful for slowing her flight of ideas. She is in agreement to follow up at [facility], and she has good awareness of her follow up appointments that she previously scheduled. She was able to engage in safety planning including plan to return to [facility] or contact emergency services if she feels unable to maintain her own safety or the safety of others. Pt had no further questions, comments, or concerns.    Plan Of Care/Follow-up recommendations:   - Discharge to outpatient level of care  -Schizoaffective disorder, bipolar type: -Continue abilify Maintena 400mg  IM q28 days (last given on 01/15/18) -Continue abilify 20mg  po qDay -Continue Depakote ER 500mg  po BID -Depakote level on 01/27/18-resultsreviewed, therapeutic at 79. -Continue Saphris 10 mg SL BID  -COPD -Continue albuterol 108 MCG/ACT take 2 puffs q6h prn wheezing/SOB -Continue Dulera 200-5 MCG/ACT take 2 puffs BID -Continue singulair 10mg  po qhs  -Anxiety -Continue vistaril 25mg  po TID prn anxiety  -EPS -Continue Cogentin 1mg  po BID prn EPS  -HTN -Continue HCTZ 25mg  po qDay  -Insomnia -Continue trazodone 100mg  po qhs  Activity:  as tolerated Diet:  normal Tests:  NA Other:   see above for [place], MD 01/30/2018, 9:12 AM

## 2018-01-30 NOTE — Discharge Summary (Addendum)
Physician Discharge Summary Note  Patient:  [name] is an 47 y.o., female  MRN:  [id number]  DOB:  Feb 28, 1971  Patient phone:  [phone] (home)   Patient address:   [address] [place] [address],   Total Time spent with patient: Greater than 30 minutes  Date of Admission:  01/22/2018   Date of Discharge: 01/29/2018  Reason for Admission: Worsening anxiety, paranoia, delusions that her neighbor thinks she is a police informant, decreased need for sleep, symptoms of mania, and poor medication adherence.  Principal Problem: Schizoaffective disorder, bipolar type [facility])  Discharge Diagnoses: Patient Active Problem List   Diagnosis Date Noted  . Encounter for medical clearance for patient hold [Z00.8]   . Noncompliance with treatment [Z91.19] 07/16/2015  . Schizoaffective disorder, bipolar type ([place]) [F25.0] 01/11/2015   Past Psychiatric History: Schizoaffective disorder, Bipolar-type.  Past Medical History:  Past Medical History:  Diagnosis Date  . Anemia   . Anxiety   . Asthma   . Bipolar 1 disorder ([place])   . Breast cancer ([place])   . Depression   . Diabetes mellitus without complication ([place])   . Hypertension   . Insomnia, persistent   . Schizophrenic disorder ([place])   . Seizures ([place])     Past Surgical History:  Procedure Laterality Date  . BREAST SURGERY Left   . TONSILLECTOMY     Family History:  Family History  Problem Relation Age of Onset  . Depression Mother   . Gout Mother   . Cancer Father        prostate  . Other Father        lung issue  . Alcoholism Other   . Heart attack Paternal Grandfather   . Heart attack Paternal Grandmother   . Heart attack Maternal Grandmother   . Heart attack Maternal Grandfather   . Depression Son   . Anxiety disorder Son    Family Psychiatric  History: See H&P.  Social History:  Social History   Substance and Sexual Activity  Alcohol Use No     Social History   Substance and Sexual  Activity  Drug Use No    Social History   Socioeconomic History  . Marital status: Single    Spouse name: Not on file  . Number of children: Not on file  . Years of education: Not on file  . Highest education level: Not on file  Occupational History  . Not on file  Social Needs  . Financial resource strain: Not on file  . Food insecurity:    Worry: Not on file    Inability: Not on file  . Transportation needs:    Medical: Not on file    Non-medical: Not on file  Tobacco Use  . Smoking status: Former Smoker    Types: Cigarettes  . Smokeless tobacco: Never Used  Substance and Sexual Activity  . Alcohol use: No  . Drug use: No  . Sexual activity: Never    Birth control/protection: Pill  Lifestyle  . Physical activity:    Days per week: Not on file    Minutes per session: Not on file  . Stress: Not on file  Relationships  . Social connections:    Talks on phone: Not on file    Gets together: Not on file    Attends religious service: Not on file    Active member of club or organization: Not on file    Attends meetings of clubs or organizations:  Not on file    Relationship status: Not on file  Other Topics Concern  . Not on file  Social History Narrative  . Not on file   Hospital Course: (Per Md's discharge SRA): [name] is a 47 y/o F with history of schizoaffective bipolar type who was admitted voluntarily from [facility] where she presented with worsening anxiety, paranoia, delusions that her neighbor thinks she is a police informant, decreased need for sleep, symptoms of mania, and poor medication adherence (pt reports she received Abilify Maintena injection last on 01/15/18, though). Pt has recent history of discharge from [facility] on 11/27/17. Pt was medically cleared in the ED, and then transferred to [facility] for additional treatment and evaluation.She was restarted on previous medications of depakote and oral abilify (Maintena received last on 5/21), and she also was started on  trial of saphris to address symptoms of mania, and dose was titrated up during her stay. Pt has demonstratedincremental improvement of her presenting symptoms.  Besides the use of Depakote ER 500 mg for mood stabilization, Saphris 5 mg for mood control, [name] was also medicated & discharged on, Cogentin 1 mg for EPS, Vistaril 25 mg prn for anxiety & Trazodone 100 mg for insomnia. She was enrolled in the group counseling sessions being offered & held on this unit. She participated & learned coping skills. She presented other significant medical issues that required treatment. She was resumed & discharged on all her pertinent home medications for those health issues. She tolerated her treatment regimen without any adverse effects or reactions reported.   As it is noted above, during the course of this hospitalization, [name] was medicated & being discharged on 2 separate antipsychotic medications (Abilify & Saphris). This is because, her symptoms has failed to respond well under an antipsychotic monotherapy. However, the combination therapy of Abilify & Saphris has shown to be effective in stabilizing this patient's mood symptoms. It is necessary for [name] to continue on these 2 combination of antipsychotic therapies to maintain maximum symptom control. However, in the event that her symptoms happen to improve or decrease, she can be then titrated down to an antipsychotic monotherapy to prevent associated adverse effects.  Today upon evaluation, pt has improvement of pressured speech, tangentiality, and flight of ideas. She is calm, polite, pleasant, and cooperative with the interview. She shares, "I'm doing good." She denies any specific concerns. She is sleeping well. Her appetite is good. She denies other physical complaints. She denies SI/HI/AH/VH. She is tolerating her current treatment regimen well, and she feels it has been helpful for slowing her flight of ideas. She is in agreement to follow up at  [facility], and she has good awareness of her follow up appointments that she previously scheduled. She was able to engage in safety planning including plan to return to [facility] or contact emergency services if she feels unable to maintain her own safety or the safety of others. Pt had no further questions, comments, or concerns.She left [facility] with all personal belongings in no apparent distress with all personal belongings. Transportation per father.  Physical Findings: AIMS: Facial and Oral Movements Muscles of Facial Expression: None, normal Lips and Perioral Area: None, normal Jaw: None, normal Tongue: None, normal,Extremity Movements Upper (arms, wrists, hands, fingers): None, normal Lower (legs, knees, ankles, toes): None, normal, Trunk Movements Neck, shoulders, hips: None, normal, Overall Severity Severity of abnormal movements (highest score from questions above): None, normal Incapacitation due to abnormal movements: None, normal [name] awareness of abnormal movements (  rate only patient's report): No Awareness, Dental Status Current problems with teeth and/or dentures?: No Does patient usually wear dentures?: No  CIWA:  CIWA-Ar Total: 1 COWS:  COWS Total Score: 2  Musculoskeletal: Strength & Muscle Tone: within normal limits Gait & Station: normal Patient leans: N/A  Psychiatric Specialty Exam: See SRA by MD  Physical Exam  Nursing note and vitals reviewed. Constitutional: She is oriented to person, place, and time. She appears well-developed.  HENT:  Head: Normocephalic.  Eyes: Pupils are equal, round, and reactive to light.  Neck: Normal range of motion.  Cardiovascular: Normal rate.  Respiratory: Effort normal.  GI: Soft.  Genitourinary:  Genitourinary Comments: Deferred  Musculoskeletal: Normal range of motion.  Neurological: She is alert and oriented to person, place, and time.  Skin: Skin is warm.  Psychiatric: She has a normal mood and affect. Her behavior is  normal.    Review of Systems  Constitutional: Negative.   HENT: Negative.   Eyes: Negative.   Respiratory: Negative.  Negative for cough and shortness of breath.   Cardiovascular: Negative.  Negative for chest pain and palpitations.  Gastrointestinal: Negative.   Genitourinary: Negative.   Musculoskeletal: Negative.   Skin: Negative.   Neurological: Negative.   Endo/Heme/Allergies: Negative.   Psychiatric/Behavioral: Positive for hallucinations (Hx. psychosis (Stabilized with medications prior to discharge)). Negative for depression, memory loss (improving ) and suicidal ideas. The patient is nervous/anxious and has insomnia (Stabilized with medication prior to discharge).   All other systems reviewed and are negative.   Blood pressure (!) 118/92, pulse (!) 101, temperature 98.5 F (36.9 C), resp. rate 20, height 5\' 6"  (1.676 m), weight 93.9 kg (207 lb), last menstrual period 12/23/2017, SpO2 100 %.Body mass index is 33.41 kg/m.   Have you used any form of tobacco in the last 30 days? (Cigarettes, Smokeless Tobacco, Cigars, and/or Pipes): No  Has this patient used any form of tobacco in the last 30 days? (Cigarettes, Smokeless Tobacco, Cigars, and/or Pipes)  No  Blood Alcohol level:  Lab Results  Component Value Date   ETH <10 01/21/2018   ETH <10 65/78/4696   Metabolic Disorder Labs:  Lab Results  Component Value Date   HGBA1C 5.0 11/21/2017   MPG 96.8 11/21/2017   MPG 123 01/15/2015   Lab Results  Component Value Date   PROLACTIN 5.4 11/21/2017   Lab Results  Component Value Date   CHOL 180 11/21/2017   TRIG 55 11/21/2017   HDL 54 11/21/2017   CHOLHDL 3.3 11/21/2017   VLDL 11 11/21/2017   LDLCALC 115 (H) 11/21/2017   LDLCALC 111 (H) 01/15/2015   See Psychiatric Specialty Exam and Suicide Risk Assessment completed by Attending Physician prior to discharge.  Discharge destination:  Home  Is patient on multiple antipsychotic therapies at discharge:  Yes,   Do  you recommend tapering to monotherapy for antipsychotics?  Yes   Has Patient had three or more failed trials of antipsychotic monotherapy by history:  Yes,   Antipsychotic medications that previously failed include:   1.  Haldol (due to allergic reaction), also failed Invega., 2.  Abilify. and 3.  Risperdal.  Recommended Plan for Multiple Antipsychotic Therapies: And because [name] has not been able to achieve symptoms control under an antipsychotic monotherapy, she is currently receiving and being discharged on 2 separate antipsychotic medications Abilify & Saphris which seem effective in controlling her symptoms at this time. It will benefit patient to continue on these combination antipsychotic therapies as recommended.  However, as her symptoms continue to improve, patient may be titrated down to an antipsychotic monotherapy. This is to decrease or prevent the chances for development of metabolic syndrome syndrome & other adverse effects associated with use of multiple antipsychotic therapies. This has to be done within the discretion & proper judgement of her outpatient psychiatric provider.  Allergies as of 01/30/2018      Reactions   Haldol [haloperidol Decanoate] Other (See Comments)   Stiffness, eyes bulging   Penicillins Nausea And Vomiting   Has patient had a PCN reaction causing immediate rash, facial/tongue/throat swelling, SOB or lightheadedness with hypotension:UNSURE  Has patient had a PCN reaction causing severe rash involving mucus membranes or skin necrosis: UNSURE Has patient had a PCN reaction that required hospitalization:UNSURE Has patient had a PCN reaction occurring within the last 10 years:No If all of the above answers are "NO", then may proceed with Cephalosporin use. CHILDHOOD REACTION   Pollen Extract    Shrimp [shellfish Allergy] Rash      Medication List    TAKE these medications     Indication  albuterol 108 (90 Base) MCG/ACT inhaler Commonly known as:   PROVENTIL HFA;VENTOLIN HFA Inhale 2 puffs into the lungs every 6 (six) hours as needed for wheezing or shortness of breath.  Indication:  Asthma   ARIPiprazole 20 MG tablet Commonly known as:  ABILIFY Take 1 tablet (20 mg total) by mouth at bedtime. For mood control What changed:  additional instructions  Indication:  Mood control   asenapine 5 MG Subl 24 hr tablet Commonly known as:  SAPHRIS Place 2 tablets (10 mg total) under the tongue 2 (two) times daily. For mood control  Indication:  Mood control   benztropine 1 MG tablet Commonly known as:  COGENTIN Take 1 tablet (1 mg total) by mouth 2 (two) times daily as needed (EPS).  Indication:  Extrapyramidal Reaction caused by Medications   budesonide-formoterol 160-4.5 MCG/ACT inhaler Commonly known as:  SYMBICORT Inhale 2 puffs into the lungs 2 (two) times daily. For Shortness of breath What changed:  additional instructions  Indication:  Asthma   divalproex 500 MG 24 hr tablet Commonly known as:  DEPAKOTE ER Take 1 tablet (500 mg total) by mouth 2 (two) times daily. For mood stabilization What changed:  additional instructions  Indication:  Mood stabilization   hydrochlorothiazide 25 MG tablet Commonly known as:  HYDRODIURIL Take 1 tablet (25 mg total) by mouth daily. For high blood pressure What changed:  additional instructions  Indication:  High Blood Pressure Disorder   hydrOXYzine 25 MG tablet Commonly known as:  ATARAX/VISTARIL Take 1 tablet (25 mg total) by mouth 3 (three) times daily as needed for anxiety.  Indication:  Feeling Anxious   mometasone-formoterol 200-5 MCG/ACT Aero Commonly known as:  DULERA Inhale 2 puffs into the lungs 2 (two) times daily. For asthma  Indication:  Asthma   montelukast 10 MG tablet Commonly known as:  SINGULAIR Take 1 tablet (10 mg total) by mouth at bedtime. For asthma What changed:  additional instructions  Indication:  Asthma   potassium chloride 10 MEQ tablet Commonly  known as:  K-DUR Take 1 tablet (10 mEq total) by mouth daily. For low potassium replacement What changed:  additional instructions  Indication:  Low Amount of Potassium in the Blood   traZODone 100 MG tablet Commonly known as:  DESYREL Take 1 tablet (100 mg total) by mouth at bedtime. For sleep What changed:  additional instructions  Indication:  [place], Paediatric nurse [facility]. Go on 02/04/2018.   Specialty:  Family Medicine Why:  Please attend your hospital discharge appt on Monday, 02/04/18, at 10:15AM.  Please attend your therapy appt on Tuesday, 02/12/18 at 10:30AM. Contact information: [address] [place] [address] [phone]          Follow-up recommendations: Activity:  As tolerated Diet: As recommended by your primary care doctor. Keep all scheduled follow-up appointments as recommended.   Comments: Patient is instructed prior to discharge to: Take all medications as prescribed by his/her mental healthcare provider. Report any adverse effects and or reactions from the medicines to his/her outpatient provider promptly. Patient has been instructed & cautioned: To not engage in alcohol and or illegal drug use while on prescription medicines. In the event of worsening symptoms, patient is instructed to call the crisis hotline, 911 and or go to the nearest ED for appropriate evaluation and treatment of symptoms. To follow-up with his/her primary care provider for your other medical issues, concerns and or health care needs.     Signed: Lindell Spar, NP, PMHNP, FNP-BC 01/30/2018, 10:42 AM   Patient seen, Suicide Assessment Completed.  Disposition Plan Reviewed

## 2018-03-01 ENCOUNTER — Emergency Department (HOSPITAL_COMMUNITY): Payer: Medicare HMO

## 2018-03-01 ENCOUNTER — Encounter (HOSPITAL_COMMUNITY): Payer: Self-pay | Admitting: Emergency Medicine

## 2018-03-01 ENCOUNTER — Emergency Department (HOSPITAL_COMMUNITY)
Admission: EM | Admit: 2018-03-01 | Discharge: 2018-03-01 | Disposition: A | Discharge status: Home / Self Care | Payer: Medicare HMO | Attending: Emergency Medicine | Admitting: Emergency Medicine

## 2018-03-01 ENCOUNTER — Other Ambulatory Visit: Payer: Self-pay

## 2018-03-01 DIAGNOSIS — I10 Essential (primary) hypertension: Secondary | ICD-10-CM | POA: Diagnosis not present

## 2018-03-01 DIAGNOSIS — J45909 Unspecified asthma, uncomplicated: Secondary | ICD-10-CM | POA: Insufficient documentation

## 2018-03-01 DIAGNOSIS — R079 Chest pain, unspecified: Secondary | ICD-10-CM | POA: Diagnosis present

## 2018-03-01 DIAGNOSIS — F419 Anxiety disorder, unspecified: Secondary | ICD-10-CM | POA: Diagnosis not present

## 2018-03-01 DIAGNOSIS — Z79899 Other long term (current) drug therapy: Secondary | ICD-10-CM | POA: Diagnosis not present

## 2018-03-01 DIAGNOSIS — E119 Type 2 diabetes mellitus without complications: Secondary | ICD-10-CM | POA: Diagnosis not present

## 2018-03-01 DIAGNOSIS — Z87891 Personal history of nicotine dependence: Secondary | ICD-10-CM | POA: Insufficient documentation

## 2018-03-01 LAB — BASIC METABOLIC PANEL
Anion gap: 7 (ref 5–15)
BUN: 12 mg/dL (ref 6–20)
CHLORIDE: 109 mmol/L (ref 98–111)
CO2: 27 mmol/L (ref 22–32)
Calcium: 9.1 mg/dL (ref 8.9–10.3)
Creatinine, Ser: 0.86 mg/dL (ref 0.44–1.00)
GFR calc non Af Amer: >60 mL/min (ref >60)
Glucose, Bld: 103 mg/dL — ABNORMAL HIGH (ref 70–99)
POTASSIUM: 3.7 mmol/L (ref 3.5–5.1)
SODIUM: 143 mmol/L (ref 135–145)

## 2018-03-01 LAB — I-STAT TROPONIN, ED: TROPONIN I, POC: 0 ng/mL (ref 0.00–0.08)

## 2018-03-01 LAB — I-STAT BETA HCG BLOOD, ED (MC, WL, AP ONLY): I-stat hCG, quantitative: <5 m[IU]/mL (ref <5)

## 2018-03-01 LAB — CBC
HEMATOCRIT: 35.8 % — AB (ref 36.0–46.0)
HEMOGLOBIN: 11.5 g/dL — AB (ref 12.0–15.0)
MCH: 29.9 pg (ref 26.0–34.0)
MCHC: 32.1 g/dL (ref 30.0–36.0)
MCV: 93 fL (ref 78.0–100.0)
Platelets: 227 10*3/uL (ref 150–400)
RBC: 3.85 MIL/uL — AB (ref 3.87–5.11)
RDW: 12.6 % (ref 11.5–15.5)
WBC: 5.4 10*3/uL (ref 4.0–10.5)

## 2018-03-01 NOTE — ED Triage Notes (Addendum)
C/o sharp L sided chest pain with sob that started just prior to arrival while having an argument.  Pt with flight of ideas.  Initial complaint said "bleeding."  Pt states she also has vaginal bleeding and believes her menstrual cycle is early.

## 2018-03-01 NOTE — ED Notes (Signed)
Pt departed in NAD.  

## 2018-03-01 NOTE — ED Provider Notes (Signed)
I saw and evaluated the patient, reviewed the resident's note and I agree with the findings and plan.  EKG: EKG Interpretation  Date/Time:  Friday March 01 2018 19:24:30 EDT Ventricular Rate:  72 PR Interval:  156 QRS Duration: 82 QT Interval:  408 QTC Calculation: 446 R Axis:   79 Text Interpretation:  Normal sinus rhythm Cannot rule out Anterior infarct , age undetermined Abnormal ECG No significant change since last tracing Confirmed by Lacretia Leigh [phone]) on 03/01/2018 10:56:54 PM   47 year old female here with multiple complaints.  Her biggest complaint is having persistent chest pain associated with her anxiety.  She does have a long psychiatric history.  EKG is without acute ischemic changes.  Troponin negative.  She is a constant chest pain for several hours.  Suspect her underlying psychiatric disorder is at the root of all this.  Stable for discharge    Lacretia Leigh, MD 03/01/18 2301

## 2018-03-01 NOTE — Discharge Instructions (Signed)
You have been diagnosed with chest pain that is likely due to your anxiety. All of your tests came back normal and you are safe to go home. Please return if you have worsening chest pain or shortness of breath.

## 2018-03-01 NOTE — ED Provider Notes (Signed)
[place] EMERGENCY DEPARTMENT Provider Note   CSN: [id number] Arrival date & time: 03/01/18  1920     History   Chief Complaint Chief Complaint  Patient presents with  . Chest Pain    HPI [name] is a 47 y.o. female.  HPI: Patient has a history of HTN, DM, asthma, schizophrenia, and bipolar disorder and she presents to the ED with 8/10 stabbing pain across her entire chest that extends up into her temples and down to her vagina. She reports that this pain has been constant since earlier today. The pain worsens when she talks, so she has to whisper. She denies any alleviating factors. She states that she often gets chest pain when she is under stress, and this feels like her normal chest pain. She endorses diaphoresis, but denies nausea or dyspnea. The patient also reports that she is having vaginal bleeding, which she thinks is an early period.   Past Medical History:  Diagnosis Date  . Anemia   . Anxiety   . Asthma   . Bipolar 1 disorder ([place])   . Breast cancer ([place])   . Depression   . Diabetes mellitus without complication ([place])   . Hypertension   . Insomnia, persistent   . Schizophrenic disorder ([place])   . Seizures [facility])     Patient Active Problem List   Diagnosis Date Noted  . Encounter for medical clearance for patient hold   . Noncompliance with treatment 07/16/2015  . Schizoaffective disorder, bipolar type ([place]) 01/11/2015    Past Surgical History:  Procedure Laterality Date  . BREAST SURGERY Left   . TONSILLECTOMY       OB History    Gravida  2   Para  2   Term  2   Preterm      AB      Living  2     SAB      TAB      Ectopic      Multiple      Live Births  2            Home Medications    Prior to Admission medications   Medication Sig Start Date End Date Taking? Authorizing Provider  albuterol (PROVENTIL HFA;VENTOLIN HFA) 108 (90 Base) MCG/ACT inhaler Inhale 2 puffs into the lungs every 6 (six) hours  as needed for wheezing or shortness of breath. 01/30/18   Lindell Spar I, NP  ARIPiprazole (ABILIFY) 20 MG tablet Take 1 tablet (20 mg total) by mouth at bedtime. For mood control 01/30/18 01/05/20  Lindell Spar I, NP  asenapine (SAPHRIS) 5 MG SUBL 24 hr tablet Place 2 tablets (10 mg total) under the tongue 2 (two) times daily. For mood control 01/30/18   Lindell Spar I, NP  benztropine (COGENTIN) 1 MG tablet Take 1 tablet (1 mg total) by mouth 2 (two) times daily as needed (EPS). 01/30/18   Nwoko, Herbert Pun I, NP  budesonide-formoterol (SYMBICORT) 160-4.5 MCG/ACT inhaler Inhale 2 puffs into the lungs 2 (two) times daily. For Shortness of breath 01/30/18   Lindell Spar I, NP  divalproex (DEPAKOTE ER) 500 MG 24 hr tablet Take 1 tablet (500 mg total) by mouth 2 (two) times daily. For mood stabilization 01/30/18 03/12/20  Lindell Spar I, NP  hydrochlorothiazide (HYDRODIURIL) 25 MG tablet Take 1 tablet (25 mg total) by mouth daily. For high blood pressure 01/30/18   Lindell Spar I, NP  hydrOXYzine (ATARAX/VISTARIL) 25 MG tablet Take 1 tablet (  25 mg total) by mouth 3 (three) times daily as needed for anxiety. 01/30/18   Lindell Spar I, NP  mometasone-formoterol (DULERA) 200-5 MCG/ACT AERO Inhale 2 puffs into the lungs 2 (two) times daily. For asthma 01/30/18   Lindell Spar I, NP  montelukast (SINGULAIR) 10 MG tablet Take 1 tablet (10 mg total) by mouth at bedtime. For asthma 01/30/18   Lindell Spar I, NP  potassium chloride (K-DUR) 10 MEQ tablet Take 1 tablet (10 mEq total) by mouth daily. For low potassium replacement 01/30/18   Lindell Spar I, NP  traZODone (DESYREL) 100 MG tablet Take 1 tablet (100 mg total) by mouth at bedtime. For sleep 01/30/18   Encarnacion Slates, NP    Family History Family History  Problem Relation Age of Onset  . Depression Mother   . Gout Mother   . Cancer Father        prostate  . Other Father        lung issue  . Alcoholism Other   . Heart attack Paternal Grandfather   . Heart attack Paternal  Grandmother   . Heart attack Maternal Grandmother   . Heart attack Maternal Grandfather   . Depression Son   . Anxiety disorder Son     Social History Social History   Tobacco Use  . Smoking status: Former Smoker    Types: Cigarettes  . Smokeless tobacco: Never Used  Substance Use Topics  . Alcohol use: Yes  . Drug use: No     Allergies   Haldol [haloperidol decanoate]; Penicillins; Pollen extract; and Shrimp [shellfish allergy]   Review of Systems Review of Systems  Constitutional: Negative for chills and fever.  Respiratory: Negative for cough and shortness of breath.   Cardiovascular: Positive for chest pain. Negative for leg swelling.  Gastrointestinal: Negative for abdominal pain, diarrhea, nausea and vomiting.  Genitourinary: Positive for vaginal bleeding and vaginal pain.  Musculoskeletal: Negative for back pain and neck pain.  Neurological: Negative for dizziness and headaches.  Psychiatric/Behavioral: Positive for sleep disturbance. The patient is nervous/anxious.      Physical Exam Updated Vital Signs BP 120/81   Pulse 64   Temp 98.4 F (36.9 C) (Oral)   Resp 17   Ht 5\' 8"  (1.727 m)   Wt 100.2 kg (221 lb)   LMP 03/01/2018   SpO2 100%   BMI 33.60 kg/m   Physical Exam  Constitutional: She is oriented to person, place, and time. She appears well-developed and well-nourished. She does not appear ill.  HENT:  Head: Normocephalic and atraumatic.  Neck: Normal range of motion. Neck supple.  Cardiovascular: Normal rate and regular rhythm. Exam reveals no gallop and no friction rub.  No murmur heard. Pulmonary/Chest: Effort normal and breath sounds normal. No respiratory distress.  Abdominal: Soft. Bowel sounds are normal. She exhibits no distension. There is no tenderness. There is no guarding.  Musculoskeletal:       Right lower leg: She exhibits no edema.  Neurological: She is alert and oriented to person, place, and time.  Skin: Skin is warm and  dry. Capillary refill takes less than 2 seconds. No rash noted.  Psychiatric: Her mood appears anxious.  Pressured speech.     ED Treatments / Results  Labs (all labs ordered are listed, but only abnormal results are displayed) Labs Reviewed  BASIC METABOLIC PANEL - Abnormal; Notable for the following components:      Result Value   Glucose, Bld 103 (*)  All other components within normal limits  CBC - Abnormal; Notable for the following components:   RBC 3.85 (*)    Hemoglobin 11.5 (*)    HCT 35.8 (*)    All other components within normal limits  I-STAT TROPONIN, ED  I-STAT BETA HCG BLOOD, ED ([facility], [facility], [facility] ONLY)    EKG EKG Interpretation  Date/Time:  Friday March 01 2018 19:24:30 EDT Ventricular Rate:  72 PR Interval:  156 QRS Duration: 82 QT Interval:  408 QTC Calculation: 446 R Axis:   79 Text Interpretation:  Normal sinus rhythm Cannot rule out Anterior infarct , age undetermined Abnormal ECG No significant change since last tracing Confirmed by Lacretia Leigh ([id number]) on 03/01/2018 10:45:43 PM   Radiology Dg Chest 2 View  Result Date: 03/01/2018 CLINICAL DATA:  Hervey Ard left chest pain and shortness of breath which began this evening. EXAM: CHEST - 2 VIEW COMPARISON:  None. FINDINGS: The lungs are clear. Heart size is normal. No pneumothorax or pleural effusion. No acute or focal bony abnormality. IMPRESSION: Negative chest. Electronically Signed   By: Inge Rise M.D.   On: 03/01/2018 19:52    Procedures Procedures (including critical care time)  Medications Ordered in ED Medications - No data to display   Initial Impression / Assessment and Plan / ED Course  I have reviewed the triage vital signs and the nursing notes.  Pertinent labs & imaging results that were available during my care of the patient were reviewed by me and considered in my medical decision making (see chart for details).  Ms. Quilter is a 47 yo female with a medical history of HTN, DM,  asthma, schizophrenia, and bipolar disorder who presents to the ED with constant stabbing chest pain that radiates to her head and vagina for hours. Upon arrival to the ED, patient is afebrile and hemodynamically stable. Physical exam is unremarkable except for pressured speech and distractibility. CBC, BNP, and troponin were wnl. EKG showed normal sinus rhythm. CXR was unremarkable. Given the patient's psychiatric history and negative workup, her chest pain is likely related to anxiety. The patient was deemed safe for discharge home with return precautions of worsening chest pain or shortness of breath. She was advised to f/u with her PCP to discuss her anxiety.   Final Clinical Impressions(s) / ED Diagnoses   Final diagnoses:  None    ED Discharge Orders    None       Dorrell, Andree Elk, MD 03/02/18 0001    Lacretia Leigh, MD 03/03/18 1733

## 2018-03-16 DIAGNOSIS — E119 Type 2 diabetes mellitus without complications: Secondary | ICD-10-CM | POA: Diagnosis not present

## 2018-03-16 DIAGNOSIS — I10 Essential (primary) hypertension: Secondary | ICD-10-CM | POA: Diagnosis not present

## 2018-03-16 DIAGNOSIS — F250 Schizoaffective disorder, bipolar type: Secondary | ICD-10-CM | POA: Diagnosis not present

## 2018-03-16 DIAGNOSIS — Z79899 Other long term (current) drug therapy: Secondary | ICD-10-CM | POA: Insufficient documentation

## 2018-03-16 DIAGNOSIS — F29 Unspecified psychosis not due to a substance or known physiological condition: Secondary | ICD-10-CM | POA: Diagnosis not present

## 2018-03-16 DIAGNOSIS — Z87891 Personal history of nicotine dependence: Secondary | ICD-10-CM | POA: Diagnosis not present

## 2018-03-16 DIAGNOSIS — R1110 Vomiting, unspecified: Secondary | ICD-10-CM | POA: Diagnosis present

## 2018-03-16 NOTE — ED Triage Notes (Signed)
Pt transported from home by EMS with c/o emesis today. Pt unable to states when or how many times she had vomited today.  Pt ambulatory to [facility] to wait for triage room

## 2018-03-17 ENCOUNTER — Emergency Department (HOSPITAL_COMMUNITY)
Admission: EM | Admit: 2018-03-17 | Discharge: 2018-03-18 | Disposition: A | Discharge status: Other Acute Inpatient Hospital | Payer: Medicare HMO | Attending: Emergency Medicine | Admitting: Emergency Medicine

## 2018-03-17 ENCOUNTER — Encounter (HOSPITAL_COMMUNITY): Payer: Self-pay | Admitting: Emergency Medicine

## 2018-03-17 ENCOUNTER — Other Ambulatory Visit: Payer: Self-pay

## 2018-03-17 DIAGNOSIS — F29 Unspecified psychosis not due to a substance or known physiological condition: Secondary | ICD-10-CM | POA: Diagnosis not present

## 2018-03-17 DIAGNOSIS — F28 Other psychotic disorder not due to a substance or known physiological condition: Secondary | ICD-10-CM

## 2018-03-17 LAB — CBC
HCT: 36.2 % (ref 36.0–46.0)
HEMOGLOBIN: 11.8 g/dL — AB (ref 12.0–15.0)
MCH: 29.9 pg (ref 26.0–34.0)
MCHC: 32.6 g/dL (ref 30.0–36.0)
MCV: 91.6 fL (ref 78.0–100.0)
PLATELETS: 231 10*3/uL (ref 150–400)
RBC: 3.95 MIL/uL (ref 3.87–5.11)
RDW: 12.4 % (ref 11.5–15.5)
WBC: 5.1 10*3/uL (ref 4.0–10.5)

## 2018-03-17 LAB — URINALYSIS, ROUTINE W REFLEX MICROSCOPIC
Bilirubin Urine: NEGATIVE
Glucose, UA: NEGATIVE mg/dL
Hgb urine dipstick: NEGATIVE
Ketones, ur: 20 mg/dL — AB
LEUKOCYTES UA: NEGATIVE
NITRITE: NEGATIVE
Protein, ur: NEGATIVE mg/dL
SPECIFIC GRAVITY, URINE: 1.023 (ref 1.005–1.030)
pH: 6 (ref 5.0–8.0)

## 2018-03-17 LAB — COMPREHENSIVE METABOLIC PANEL
ALT: 12 U/L (ref 0–44)
ANION GAP: 8 (ref 5–15)
AST: 15 U/L (ref 15–41)
Albumin: 3.9 g/dL (ref 3.5–5.0)
Alkaline Phosphatase: 49 U/L (ref 38–126)
BUN: 5 mg/dL — ABNORMAL LOW (ref 6–20)
CALCIUM: 9.4 mg/dL (ref 8.9–10.3)
CO2: 25 mmol/L (ref 22–32)
CREATININE: 0.77 mg/dL (ref 0.44–1.00)
Chloride: 107 mmol/L (ref 98–111)
GFR calc Af Amer: >60 mL/min (ref >60)
Glucose, Bld: 100 mg/dL — ABNORMAL HIGH (ref 70–99)
POTASSIUM: 3.6 mmol/L (ref 3.5–5.1)
Sodium: 140 mmol/L (ref 135–145)
TOTAL PROTEIN: 7.1 g/dL (ref 6.5–8.1)
Total Bilirubin: 0.8 mg/dL (ref 0.3–1.2)

## 2018-03-17 LAB — RAPID URINE DRUG SCREEN, HOSP PERFORMED
Amphetamines: NOT DETECTED
BARBITURATES: NOT DETECTED
Benzodiazepines: NOT DETECTED
COCAINE: NOT DETECTED
OPIATES: NOT DETECTED
TETRAHYDROCANNABINOL: NOT DETECTED

## 2018-03-17 LAB — LIPASE, BLOOD: Lipase: 24 U/L (ref 11–51)

## 2018-03-17 LAB — I-STAT BETA HCG BLOOD, ED (MC, WL, AP ONLY)

## 2018-03-17 MED ORDER — ACETAMINOPHEN 325 MG PO TABS: 650.0000 mg | ORAL_TABLET | ORAL | Status: DC | PRN

## 2018-03-17 MED ORDER — OLANZAPINE 5 MG PO TBDP
10.0000 mg | ORAL_TABLET | Freq: Three times a day (TID) | ORAL | Status: DC | PRN
  Filled 2018-03-17: qty 2

## 2018-03-17 MED ORDER — HYDROCHLOROTHIAZIDE 25 MG PO TABS
25.0000 mg | ORAL_TABLET | Freq: Every day | ORAL | Status: DC
  Administered 2018-03-17 – 2018-03-18 (×2): 25 mg via ORAL
  Filled 2018-03-17 (×2): qty 1

## 2018-03-17 MED ORDER — FLUTICASONE FUROATE-VILANTEROL 200-25 MCG/INH IN AEPB: 1.0000 | INHALATION_SPRAY | Freq: Every day | RESPIRATORY_TRACT | Status: DC

## 2018-03-17 MED ORDER — DIVALPROEX SODIUM ER 500 MG PO TB24
1000.0000 mg | ORAL_TABLET | Freq: Every day | ORAL | Status: DC
  Administered 2018-03-17: 1000 mg via ORAL
  Filled 2018-03-17: qty 2

## 2018-03-17 MED ORDER — DIVALPROEX SODIUM ER 500 MG PO TB24
500.0000 mg | ORAL_TABLET | Freq: Every day | ORAL | Status: DC
  Administered 2018-03-17: 500 mg via ORAL
  Administered 2018-03-18: 1000 mg via ORAL
  Filled 2018-03-17: qty 1
  Filled 2018-03-17: qty 2

## 2018-03-17 MED ORDER — DIVALPROEX SODIUM ER 500 MG PO TB24: 500.0000 mg | ORAL_TABLET | Freq: Two times a day (BID) | ORAL | Status: DC

## 2018-03-17 MED ORDER — LORAZEPAM 1 MG PO TABS: 1.0000 mg | ORAL_TABLET | ORAL | Status: DC | PRN

## 2018-03-17 MED ORDER — HYDROXYZINE HCL 25 MG PO TABS
25.0000 mg | ORAL_TABLET | Freq: Three times a day (TID) | ORAL | Status: DC | PRN
  Administered 2018-03-18: 25 mg via ORAL
  Filled 2018-03-17: qty 1

## 2018-03-17 MED ORDER — ALBUTEROL SULFATE HFA 108 (90 BASE) MCG/ACT IN AERS
2.0000 | INHALATION_SPRAY | Freq: Four times a day (QID) | RESPIRATORY_TRACT | Status: DC | PRN
  Filled 2018-03-17: qty 6.7

## 2018-03-17 MED ORDER — ARIPIPRAZOLE 10 MG PO TABS
20.0000 mg | ORAL_TABLET | Freq: Every day | ORAL | Status: DC
  Administered 2018-03-17: 20 mg via ORAL
  Filled 2018-03-17: qty 2

## 2018-03-17 MED ORDER — ZIPRASIDONE MESYLATE 20 MG IM SOLR: 20.0000 mg | INTRAMUSCULAR | Status: DC | PRN

## 2018-03-17 MED ORDER — ONDANSETRON HCL 4 MG PO TABS: 4.0000 mg | ORAL_TABLET | Freq: Three times a day (TID) | ORAL | Status: DC | PRN

## 2018-03-17 MED ORDER — MONTELUKAST SODIUM 10 MG PO TABS
10.0000 mg | ORAL_TABLET | Freq: Every day | ORAL | Status: DC
  Administered 2018-03-17: 10 mg via ORAL
  Filled 2018-03-17: qty 1

## 2018-03-17 MED ORDER — TRAZODONE HCL 100 MG PO TABS
100.0000 mg | ORAL_TABLET | Freq: Every day | ORAL | Status: DC
  Administered 2018-03-17: 100 mg via ORAL
  Filled 2018-03-17: qty 1

## 2018-03-17 MED ORDER — ASENAPINE MALEATE 5 MG SL SUBL: 10.0000 mg | SUBLINGUAL_TABLET | Freq: Two times a day (BID) | SUBLINGUAL | Status: DC

## 2018-03-17 MED ORDER — BENZTROPINE MESYLATE 1 MG PO TABS: 1.0000 mg | ORAL_TABLET | Freq: Two times a day (BID) | ORAL | Status: DC | PRN

## 2018-03-17 MED ORDER — MOMETASONE FURO-FORMOTEROL FUM 200-5 MCG/ACT IN AERO
2.0000 | INHALATION_SPRAY | Freq: Two times a day (BID) | RESPIRATORY_TRACT | Status: DC
  Administered 2018-03-17 – 2018-03-18 (×2): 2 via RESPIRATORY_TRACT
  Filled 2018-03-17: qty 8.8

## 2018-03-17 NOTE — BH Assessment (Signed)
Attempted assessment.  Pt not yet roomed.  Nurse to call when Pt is ready.

## 2018-03-17 NOTE — ED Notes (Signed)
Pt wanded by security. Ready for transport to [facility]

## 2018-03-17 NOTE — ED Triage Notes (Signed)
Reports nausea and "throwing up shit".  Initially endorsed diarrhea then reports no diarrhea.  Reports vomiting 30 times or more.  Symptoms started today.  Talking about people having abortions during triage.  Rambling about multiple subjects.

## 2018-03-17 NOTE — ED Notes (Signed)
Pt resting VS documented 

## 2018-03-17 NOTE — ED Notes (Addendum)
Pt voiced understanding of Medical Clearance Pt Policy - copy of form given.

## 2018-03-17 NOTE — ED Notes (Signed)
Lunch ordered 

## 2018-03-17 NOTE — ED Notes (Signed)
Lab Tech advised will obtain pt's urine specimen and run UDS.

## 2018-03-17 NOTE — Progress Notes (Signed)
Patient meets criteria for inpatient treatment. There are currently no appropriate beds available at [facility] per [facility]. CSW faxed referrals to the following facilities for review.  [place], [place], [place] [place] [facility], [place], [place], [place], [place], [place], [place], [place], [place], [place], [place], [place], [place], [place], [place], [place], [place], [place], [place], [place], and [place].    TTS will continue to seek bed placement.     Herbert Moors, MSW, LCSW, LCAS 03/17/2018 9:46 AM

## 2018-03-17 NOTE — ED Notes (Signed)
Pt given purple scrubs, underwear, and socks to change into.

## 2018-03-17 NOTE — ED Notes (Signed)
Pt's belongings inventoried - 1 labeled belongings bag placed in [place] [address] and 1 valuables envelope - Security.

## 2018-03-17 NOTE — ED Notes (Signed)
Will administer 1000 meds when pt awakens.

## 2018-03-17 NOTE — ED Notes (Signed)
Pt arrived to [name] via stretcher. Pt wearing burgundy scrubs. Belongings placed in labeled belongings bag w/purse - at nurses' desk for inventory. Pt noted to be alert. Pt noted w/mumbled speech at end of sentences. TTS being performed.
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
## 2018-03-17 NOTE — ED Provider Notes (Signed)
[facility] [facility] EMERGENCY DEPARTMENT Provider Note   CSN: [id number] Arrival date & time: 03/16/18  2348     History   Chief Complaint Chief Complaint  Patient presents with  . Emesis    HPI Emer [name] is a 47 y.o. female.  Patient presents to the emergency department for evaluation of nausea and vomiting.  Patient is more incoherent and appears delusional.  Her speech pattern is extremely delusional.  She seems somewhat paranoid.  She thinks bugs are boring into her brain, entering her brain to her ears.  She cannot stay on topic or answer questions appropriately. Level V Caveat due to psych.     Past Medical History:  Diagnosis Date  . Anemia   . Anxiety   . Asthma   . Bipolar 1 disorder ([place])   . Breast cancer ([place])   . Depression   . Diabetes mellitus without complication ([place])   . Hypertension   . Insomnia, persistent   . Schizophrenic disorder ([place])   . Seizures [facility])     Patient Active Problem List   Diagnosis Date Noted  . Encounter for medical clearance for patient hold   . Noncompliance with treatment 07/16/2015  . Schizoaffective disorder, bipolar type ([place]) 01/11/2015    Past Surgical History:  Procedure Laterality Date  . BREAST SURGERY Left   . TONSILLECTOMY       OB History    Gravida  2   Para  2   Term  2   Preterm      AB      Living  2     SAB      TAB      Ectopic      Multiple      Live Births  2            Home Medications    Prior to Admission medications   Medication Sig Start Date End Date Taking? Authorizing Provider  albuterol (PROVENTIL HFA;VENTOLIN HFA) 108 (90 Base) MCG/ACT inhaler Inhale 2 puffs into the lungs every 6 (six) hours as needed for wheezing or shortness of breath. 01/30/18   Lindell Spar I, NP  ARIPiprazole (ABILIFY) 20 MG tablet Take 1 tablet (20 mg total) by mouth at bedtime. For mood control 01/30/18 01/05/20  Lindell Spar I, NP  asenapine (SAPHRIS) 5 MG SUBL 24 hr tablet  Place 2 tablets (10 mg total) under the tongue 2 (two) times daily. For mood control 01/30/18   Lindell Spar I, NP  benztropine (COGENTIN) 1 MG tablet Take 1 tablet (1 mg total) by mouth 2 (two) times daily as needed (EPS). 01/30/18   Nwoko, Herbert Pun I, NP  budesonide-formoterol (SYMBICORT) 160-4.5 MCG/ACT inhaler Inhale 2 puffs into the lungs 2 (two) times daily. For Shortness of breath 01/30/18   Lindell Spar I, NP  divalproex (DEPAKOTE ER) 500 MG 24 hr tablet Take 1 tablet (500 mg total) by mouth 2 (two) times daily. For mood stabilization 01/30/18 03/12/20  Lindell Spar I, NP  hydrochlorothiazide (HYDRODIURIL) 25 MG tablet Take 1 tablet (25 mg total) by mouth daily. For high blood pressure 01/30/18   Lindell Spar I, NP  hydrOXYzine (ATARAX/VISTARIL) 25 MG tablet Take 1 tablet (25 mg total) by mouth 3 (three) times daily as needed for anxiety. 01/30/18   Lindell Spar I, NP  mometasone-formoterol (DULERA) 200-5 MCG/ACT AERO Inhale 2 puffs into the lungs 2 (two) times daily. For asthma 01/30/18   Encarnacion Slates, NP  montelukast (  SINGULAIR) 10 MG tablet Take 1 tablet (10 mg total) by mouth at bedtime. For asthma 01/30/18   Lindell Spar I, NP  potassium chloride (K-DUR) 10 MEQ tablet Take 1 tablet (10 mEq total) by mouth daily. For low potassium replacement 01/30/18   Lindell Spar I, NP  traZODone (DESYREL) 100 MG tablet Take 1 tablet (100 mg total) by mouth at bedtime. For sleep 01/30/18   Encarnacion Slates, NP    Family History Family History  Problem Relation Age of Onset  . Depression Mother   . Gout Mother   . Cancer Father        prostate  . Other Father        lung issue  . Alcoholism Other   . Heart attack Paternal Grandfather   . Heart attack Paternal Grandmother   . Heart attack Maternal Grandmother   . Heart attack Maternal Grandfather   . Depression Son   . Anxiety disorder Son     Social History Social History   Tobacco Use  . Smoking status: Former Smoker    Types: Cigarettes  . Smokeless  tobacco: Never Used  Substance Use Topics  . Alcohol use: Yes  . Drug use: No     Allergies   Haldol [haloperidol decanoate]; Penicillins; Pollen extract; and Shrimp [shellfish allergy]   Review of Systems Review of Systems  Unable to perform ROS: Psychiatric disorder     Physical Exam Updated Vital Signs BP 117/80 (BP Location: Right Arm)   Pulse 66   Temp 98.2 F (36.8 C) (Oral)   Resp 20   Ht 5' 6.75" (1.695 m)   Wt 103.9 kg (229 lb)   LMP 03/01/2018   SpO2 100%   BMI 36.14 kg/m   Physical Exam  Constitutional: She is oriented to person, place, and time. She appears well-developed and well-nourished. No distress.  HENT:  Head: Normocephalic and atraumatic.  Right Ear: Hearing normal.  Left Ear: Hearing normal.  Nose: Nose normal.  Mouth/Throat: Oropharynx is clear and moist and mucous membranes are normal.  Eyes: Pupils are equal, round, and reactive to light. Conjunctivae and EOM are normal.  Neck: Normal range of motion. Neck supple.  Cardiovascular: Regular rhythm, S1 normal and S2 normal. Exam reveals no gallop and no friction rub.  No murmur heard. Pulmonary/Chest: Effort normal and breath sounds normal. No respiratory distress. She exhibits no tenderness.  Abdominal: Soft. Normal appearance and bowel sounds are normal. There is no hepatosplenomegaly. There is no tenderness. There is no rebound, no guarding, no tenderness at McBurney's point and negative Murphy's sign. No hernia.  Musculoskeletal: Normal range of motion.  Neurological: She is alert and oriented to person, place, and time. She has normal strength. No cranial nerve deficit or sensory deficit. Coordination normal. GCS eye subscore is 4. GCS verbal subscore is 5. GCS motor subscore is 6.  Skin: Skin is warm, dry and intact. No rash noted. No cyanosis.  Psychiatric: Her speech is rapid and/or pressured and tangential. She is agitated. Thought content is paranoid and delusional. She exhibits a  depressed mood.  Nursing note and vitals reviewed.    ED Treatments / Results  Labs (all labs ordered are listed, but only abnormal results are displayed) Labs Reviewed  COMPREHENSIVE METABOLIC PANEL - Abnormal; Notable for the following components:      Result Value   Glucose, Bld 100 (*)    BUN 5 (*)    All other components within normal limits  CBC -  Abnormal; Notable for the following components:   Hemoglobin 11.8 (*)    All other components within normal limits  URINALYSIS, ROUTINE W REFLEX MICROSCOPIC - Abnormal; Notable for the following components:   Ketones, ur 20 (*)    All other components within normal limits  LIPASE, BLOOD  I-STAT BETA HCG BLOOD, ED ([facility], [facility], [facility] ONLY)    EKG None  Radiology No results found.  Procedures Procedures (including critical care time)  Medications Ordered in ED Medications - No data to display   Initial Impression / Assessment and Plan / ED Course  I have reviewed the triage vital signs and the nursing notes.  Pertinent labs & imaging results that were available during my care of the patient were reviewed by me and considered in my medical decision making (see chart for details).     Patient initially presented stating that she was having nausea and vomiting.  She reported that she was vomiting a brown substance, thought it was "shit".  She has not had any nausea or vomiting here in the ER.  Abdominal exam is benign.  She went on to have many extremely tangential rants and appears to be paranoid and delusional.  Work-up unremarkable.  Will recommend psychiatric evaluation to ensure her safety.  Final Clinical Impressions(s) / ED Diagnoses   Final diagnoses:  Other psychotic disorder not due to substance or known physiological condition [facility])    ED Discharge Orders    None       Orpah Greek, MD 03/17/18 [phone]

## 2018-03-17 NOTE — Progress Notes (Signed)
Patient has been accepted to [facility]. The accepting doctor is Dr. Jonelle Sports. They would like for her to arrive tomorrow by 10 AM to the main office. The reporting number is [phone]. LCSW has notified patient's nurse of placement plans.   Herbert Moors MSW, LCSW, LCAS Clinical Social Worker 03/17/2018 4:40 PM

## 2018-03-18 DIAGNOSIS — F29 Unspecified psychosis not due to a substance or known physiological condition: Secondary | ICD-10-CM | POA: Diagnosis not present

## 2018-03-18 NOTE — ED Triage Notes (Signed)
TC to sheriff to request transport to [facility].

## 2018-03-18 NOTE — ED Provider Notes (Signed)
Pt has been accepted at [facility] by Dr. Duffy Bruce.  Pt reexamined and remains stable for transfer.   Isla Pence, MD 03/18/18 [phone]

## 2018-03-18 NOTE — Progress Notes (Signed)
Patient to be IVC'd prior to transport.  Disposition CSW called [facility] to verify that they will hold bed.  Per [facility], in Intake and Assessment, they are keeping bed open for patient.  Areatha Keas. Judi Cong, MSW, [place] Disposition Clinical Social Work [phone] (cell) [phone] (office)

## 2018-03-18 NOTE — ED Triage Notes (Signed)
TC to [facility] to give report. This Nurse spoke with Eudelia Bunch RN ,who refused Pt  Until IVC paper work is done. When asked why this information was not given yesterday, when Pt was accepted . The response from Nunzio Cory was I was not here yesterday.

## 2018-03-18 NOTE — Progress Notes (Signed)
CSW spoke with admissions staff @[facility] and assessed that if pt is unable to be transported by LE tonight, a bed will still be available to her in the morning.   Audree Camel, LCSW, [place] Disposition [facility] [phone]

## 2018-03-18 NOTE — ED Notes (Signed)
Declined W/C at D/C and was escorted to lobby by RN. 

## 2018-03-18 NOTE — ED Notes (Signed)
Patient has been heard several times through out the night talking to herself-Monique,RN

## 2018-08-26 ENCOUNTER — Other Ambulatory Visit: Payer: Self-pay

## 2018-08-26 ENCOUNTER — Inpatient Hospital Stay (HOSPITAL_COMMUNITY)
Admission: AD | Admit: 2018-08-26 | Discharge: 2018-08-30 | DRG: 885 | Disposition: A | Discharge status: Home / Self Care | Payer: Medicare PPO | Source: Intra-hospital | Attending: Psychiatry | Admitting: Psychiatry

## 2018-08-26 ENCOUNTER — Encounter (HOSPITAL_COMMUNITY): Payer: Self-pay

## 2018-08-26 ENCOUNTER — Emergency Department (HOSPITAL_COMMUNITY)
Admission: EM | Admit: 2018-08-26 | Discharge: 2018-08-26 | Disposition: A | Discharge status: Other Acute Inpatient Hospital | Payer: Medicare PPO | Attending: Emergency Medicine | Admitting: Emergency Medicine

## 2018-08-26 ENCOUNTER — Encounter (HOSPITAL_COMMUNITY): Payer: Self-pay | Admitting: Emergency Medicine

## 2018-08-26 DIAGNOSIS — Z91048 Other nonmedicinal substance allergy status: Secondary | ICD-10-CM | POA: Diagnosis not present

## 2018-08-26 DIAGNOSIS — S60519A Abrasion of unspecified hand, initial encounter: Secondary | ICD-10-CM

## 2018-08-26 DIAGNOSIS — Z915 Personal history of self-harm: Secondary | ICD-10-CM

## 2018-08-26 DIAGNOSIS — F419 Anxiety disorder, unspecified: Secondary | ICD-10-CM | POA: Diagnosis present

## 2018-08-26 DIAGNOSIS — Y939 Activity, unspecified: Secondary | ICD-10-CM | POA: Insufficient documentation

## 2018-08-26 DIAGNOSIS — F250 Schizoaffective disorder, bipolar type: Secondary | ICD-10-CM | POA: Diagnosis present

## 2018-08-26 DIAGNOSIS — S60512A Abrasion of left hand, initial encounter: Secondary | ICD-10-CM | POA: Insufficient documentation

## 2018-08-26 DIAGNOSIS — J45909 Unspecified asthma, uncomplicated: Secondary | ICD-10-CM | POA: Insufficient documentation

## 2018-08-26 DIAGNOSIS — E119 Type 2 diabetes mellitus without complications: Secondary | ICD-10-CM | POA: Diagnosis not present

## 2018-08-26 DIAGNOSIS — Z91013 Allergy to seafood: Secondary | ICD-10-CM

## 2018-08-26 DIAGNOSIS — Z7951 Long term (current) use of inhaled steroids: Secondary | ICD-10-CM

## 2018-08-26 DIAGNOSIS — Z880 Allergy status to penicillin: Secondary | ICD-10-CM

## 2018-08-26 DIAGNOSIS — Z79899 Other long term (current) drug therapy: Secondary | ICD-10-CM | POA: Insufficient documentation

## 2018-08-26 DIAGNOSIS — S6990XA Unspecified injury of unspecified wrist, hand and finger(s), initial encounter: Secondary | ICD-10-CM | POA: Diagnosis present

## 2018-08-26 DIAGNOSIS — Z818 Family history of other mental and behavioral disorders: Secondary | ICD-10-CM

## 2018-08-26 DIAGNOSIS — Z853 Personal history of malignant neoplasm of breast: Secondary | ICD-10-CM

## 2018-08-26 DIAGNOSIS — Z87891 Personal history of nicotine dependence: Secondary | ICD-10-CM | POA: Diagnosis not present

## 2018-08-26 DIAGNOSIS — W19XXXA Unspecified fall, initial encounter: Secondary | ICD-10-CM | POA: Diagnosis not present

## 2018-08-26 DIAGNOSIS — Y929 Unspecified place or not applicable: Secondary | ICD-10-CM | POA: Insufficient documentation

## 2018-08-26 DIAGNOSIS — I10 Essential (primary) hypertension: Secondary | ICD-10-CM | POA: Diagnosis present

## 2018-08-26 DIAGNOSIS — F29 Unspecified psychosis not due to a substance or known physiological condition: Secondary | ICD-10-CM

## 2018-08-26 DIAGNOSIS — S60511A Abrasion of right hand, initial encounter: Secondary | ICD-10-CM | POA: Insufficient documentation

## 2018-08-26 DIAGNOSIS — Z8249 Family history of ischemic heart disease and other diseases of the circulatory system: Secondary | ICD-10-CM

## 2018-08-26 DIAGNOSIS — Z888 Allergy status to other drugs, medicaments and biological substances status: Secondary | ICD-10-CM

## 2018-08-26 DIAGNOSIS — G4700 Insomnia, unspecified: Secondary | ICD-10-CM | POA: Diagnosis present

## 2018-08-26 DIAGNOSIS — Y999 Unspecified external cause status: Secondary | ICD-10-CM | POA: Diagnosis not present

## 2018-08-26 LAB — CBC
HCT: 39.3 % (ref 36.0–46.0)
Hemoglobin: 12.6 g/dL (ref 12.0–15.0)
MCH: 30.4 pg (ref 26.0–34.0)
MCHC: 32.1 g/dL (ref 30.0–36.0)
MCV: 94.7 fL (ref 80.0–100.0)
NRBC: 0 % (ref 0.0–0.2)
PLATELETS: 230 10*3/uL (ref 150–400)
RBC: 4.15 MIL/uL (ref 3.87–5.11)
RDW: 12.6 % (ref 11.5–15.5)
WBC: 6 10*3/uL (ref 4.0–10.5)

## 2018-08-26 LAB — RAPID URINE DRUG SCREEN, HOSP PERFORMED
AMPHETAMINES: NOT DETECTED
BENZODIAZEPINES: NOT DETECTED
Barbiturates: NOT DETECTED
Cocaine: NOT DETECTED
OPIATES: NOT DETECTED
Tetrahydrocannabinol: NOT DETECTED

## 2018-08-26 LAB — COMPREHENSIVE METABOLIC PANEL
ALT: 17 U/L (ref 0–44)
ANION GAP: 8 (ref 5–15)
AST: 19 U/L (ref 15–41)
Albumin: 3.8 g/dL (ref 3.5–5.0)
Alkaline Phosphatase: 43 U/L (ref 38–126)
BUN: 12 mg/dL (ref 6–20)
CO2: 25 mmol/L (ref 22–32)
Calcium: 9 mg/dL (ref 8.9–10.3)
Chloride: 107 mmol/L (ref 98–111)
Creatinine, Ser: 0.81 mg/dL (ref 0.44–1.00)
GFR calc non Af Amer: >60 mL/min (ref >60)
Glucose, Bld: 107 mg/dL — ABNORMAL HIGH (ref 70–99)
Potassium: 3.8 mmol/L (ref 3.5–5.1)
SODIUM: 140 mmol/L (ref 135–145)
Total Bilirubin: 0.5 mg/dL (ref 0.3–1.2)
Total Protein: 6.6 g/dL (ref 6.5–8.1)

## 2018-08-26 LAB — SALICYLATE LEVEL

## 2018-08-26 LAB — I-STAT BETA HCG BLOOD, ED (MC, WL, AP ONLY): I-stat hCG, quantitative: <5 m[IU]/mL (ref <5)

## 2018-08-26 LAB — ETHANOL: Alcohol, Ethyl (B): <10 mg/dL (ref <10)

## 2018-08-26 LAB — ACETAMINOPHEN LEVEL

## 2018-08-26 MED ORDER — DIVALPROEX SODIUM ER 500 MG PO TB24: 500.0000 mg | ORAL_TABLET | Freq: Two times a day (BID) | ORAL | Status: DC

## 2018-08-26 MED ORDER — TRAZODONE HCL 100 MG PO TABS: 100.0000 mg | ORAL_TABLET | Freq: Every day | ORAL | Status: DC

## 2018-08-26 MED ORDER — RISPERIDONE 2 MG PO TABS
2.0000 mg | ORAL_TABLET | Freq: Two times a day (BID) | ORAL | Status: DC
  Administered 2018-08-26 – 2018-08-27 (×2): 2 mg via ORAL
  Filled 2018-08-26 (×4): qty 1

## 2018-08-26 MED ORDER — ACETAMINOPHEN 325 MG PO TABS: 650.0000 mg | ORAL_TABLET | Freq: Four times a day (QID) | ORAL | Status: DC | PRN

## 2018-08-26 MED ORDER — BACITRACIN ZINC 500 UNIT/GM EX OINT
TOPICAL_OINTMENT | Freq: Two times a day (BID) | CUTANEOUS | Status: DC
  Administered 2018-08-28 – 2018-08-29 (×2): via TOPICAL
  Administered 2018-08-30: 1 via TOPICAL
  Filled 2018-08-26: qty 28.35

## 2018-08-26 MED ORDER — BACITRACIN ZINC 500 UNIT/GM EX OINT
TOPICAL_OINTMENT | Freq: Two times a day (BID) | CUTANEOUS | Status: DC
  Administered 2018-08-26: 1 via TOPICAL
  Filled 2018-08-26: qty 0.9

## 2018-08-26 MED ORDER — TRAZODONE HCL 100 MG PO TABS
100.0000 mg | ORAL_TABLET | Freq: Every day | ORAL | Status: DC
  Administered 2018-08-26: 100 mg via ORAL
  Filled 2018-08-26 (×3): qty 1

## 2018-08-26 MED ORDER — ALUM & MAG HYDROXIDE-SIMETH 200-200-20 MG/5ML PO SUSP: 30.0000 mL | ORAL | Status: DC | PRN

## 2018-08-26 MED ORDER — HYDROXYZINE HCL 25 MG PO TABS: 25.0000 mg | ORAL_TABLET | Freq: Three times a day (TID) | ORAL | Status: DC | PRN

## 2018-08-26 MED ORDER — MONTELUKAST SODIUM 10 MG PO TABS
10.0000 mg | ORAL_TABLET | Freq: Every day | ORAL | Status: DC
  Administered 2018-08-26 – 2018-08-29 (×4): 10 mg via ORAL
  Filled 2018-08-26 (×6): qty 1

## 2018-08-26 MED ORDER — ALBUTEROL SULFATE HFA 108 (90 BASE) MCG/ACT IN AERS: 2.0000 | INHALATION_SPRAY | Freq: Four times a day (QID) | RESPIRATORY_TRACT | Status: DC | PRN

## 2018-08-26 MED ORDER — BENZTROPINE MESYLATE 1 MG PO TABS: 1.0000 mg | ORAL_TABLET | Freq: Two times a day (BID) | ORAL | Status: DC | PRN

## 2018-08-26 MED ORDER — MOMETASONE FURO-FORMOTEROL FUM 200-5 MCG/ACT IN AERO
2.0000 | INHALATION_SPRAY | Freq: Two times a day (BID) | RESPIRATORY_TRACT | Status: DC
  Administered 2018-08-26 – 2018-08-30 (×7): 2 via RESPIRATORY_TRACT
  Filled 2018-08-26: qty 8.8

## 2018-08-26 MED ORDER — MAGNESIUM HYDROXIDE 400 MG/5ML PO SUSP: 30.0000 mL | Freq: Every day | ORAL | Status: DC | PRN

## 2018-08-26 MED ORDER — DIVALPROEX SODIUM 500 MG PO DR TAB
500.0000 mg | DELAYED_RELEASE_TABLET | Freq: Two times a day (BID) | ORAL | Status: DC
  Administered 2018-08-26 – 2018-08-28 (×5): 500 mg via ORAL
  Filled 2018-08-26 (×8): qty 1

## 2018-08-26 MED ORDER — OLANZAPINE 10 MG PO TABS: 10.0000 mg | ORAL_TABLET | Freq: Two times a day (BID) | ORAL | Status: DC

## 2018-08-26 NOTE — Progress Notes (Signed)
Patient presents to [facility] due to flight of ideas, disorganized thinking, and depression. Patient was uncooperative during assessment, only answering select questions. Writer asked patient why she's in the hospital, patient said "I walked my ass in here myself." Then patient started saying how some "white girl" made her come to the hospital. Patient was irritable, but was not aggressive towards writer or MHT. Patient kept saying "I love you, baby.... but not in a sexual way" to Probation officer. [name] hands are bandaged due to a fall outside on the concrete. They are currently wrapped. Patient reportedly has a history of bipolar disorder as well as schizophrenia, but is a poor historian and would not tell Probation officer any information. Patient kept saying how she will kill herself as soon as she is discharged from the hospital, as well as "pee in their mouths," and "get lit." Denies HI AVH. Denies drug and alcohol use. Skin search was performed and was found unremarkable other than scratches on bilateral hands wrapped in gauze.  Safety is maintained with 15 minute checks as well as environmental checks. Will continue to monitor and provide support.
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
## 2018-08-26 NOTE — ED Notes (Signed)
Bed: [facility] Expected date:  Expected time:  Means of arrival:  Comments: [facility]

## 2018-08-26 NOTE — ED Notes (Signed)
Pt presents with bilateral dressings to both hands.  Pt  Disorganized, flight of ideas and tangential, calm & cooperative at present.  Awake, alert & responsive, no distress noted, Denies SI, HI or AVH, monitoring for safety, Q 15 min checks in effect.  Pt resting at present.

## 2018-08-26 NOTE — ED Notes (Signed)
Bed: WTR6 Expected date:  Expected time:  Means of arrival:  Comments: 

## 2018-08-26 NOTE — ED Notes (Signed)
Pt discharged safely with [facility] transportation.  Pt was in no distress.  All belongings were sent with patient.

## 2018-08-26 NOTE — ED Notes (Signed)
Patient has a bed over at [facility] after 8am.

## 2018-08-26 NOTE — BHH Group Notes (Signed)
       LCSW Group Therapy Notes    Type of Therapy and Topic: Group Therapy: Effective Communication - Criticism    Participation Level: Active - Off topic   Description of Group:  In this group patients will be asked to identify their own styles of communication as well as defining and identifying passive, assertive, and aggressive styles of communication. Participants will identify strategies to communicate in a more assertive manner in an effort to appropriately meet their needs. This group will be process-oriented, with patients participating in exploration of their own experiences as well as giving and receiving support and challenge from other group members.   Therapeutic Goals: 1. Patient will identify their personal communication style. 2. Patient will identify passive, assertive, and aggressive forms of communication. 3. Patient will identify strategies for developing more effective communication to appropriately meet their needs.    Summary of Patient Progress Patient remained present throughout group, frequently contributing to the conversation somewhat on topic but often interjecting bizarre sexual comments. Patient stated several times there is no helping her, Jesus will settle it all, etc. Patient apologized to writer toward the end of group for speaking crassly.   Therapeutic Modalities:  Communication Skills Solution Focused Therapy Motivational [place], MSW, LCSWA
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
## 2018-08-26 NOTE — ED Provider Notes (Signed)
[place] DEPT Provider Note   CSN: [id number] Arrival date & time: 08/26/18  0133     History   Chief Complaint Chief Complaint  Patient presents with  . Medical Clearance    HPI [name] is a 47 y.o. female.  [name] is a 47 y.o. female with a history of schizophrenic disorder, bipolar, anxiety and depression as well as diabetes, hypertension, seizures and breast cancer, who presents to the emergency department reporting that she needs her Abilify shot.  She reports she was walking home when she suddenly fell.  She reports scrapes to both of her palms.  She is not sure what made her fall.  Denies any other injuries from the fall except for some mild soreness over her right knee.  She is continued to be ambulatory on the knee without difficulty.  Patient denies SI or HI, but patient appears to be acutely decompensated with rapid pressured speech.  Patient is a difficult historian as her thoughts are very tangential.  She denies any other focal medical complaints.  No chest pain or shortness of breath, no abdominal pain, no headaches or vision changes.  No signs of other injuries from fall.  Patient does not think that she lost consciousness.  Per chart review multiple behavioral health admissions in the past.     Past Medical History:  Diagnosis Date  . Anemia   . Anxiety   . Asthma   . Bipolar 1 disorder ([place])   . Breast cancer ([place])   . Depression   . Diabetes mellitus without complication ([place])   . Hypertension   . Insomnia, persistent   . Schizophrenic disorder ([place])   . Seizures [facility])     Patient Active Problem List   Diagnosis Date Noted  . Encounter for medical clearance for patient hold   . Noncompliance with treatment 07/16/2015  . Schizoaffective disorder, bipolar type ([place]) 01/11/2015    Past Surgical History:  Procedure Laterality Date  . BREAST SURGERY Left   . TONSILLECTOMY       OB History    Gravida  2   Para  2   Term  2   Preterm      AB      Living  2     SAB      TAB      Ectopic      Multiple      Live Births  2            Home Medications    Prior to Admission medications   Medication Sig Start Date End Date Taking? Authorizing Provider  albuterol (PROVENTIL HFA;VENTOLIN HFA) 108 (90 Base) MCG/ACT inhaler Inhale 2 puffs into the lungs every 6 (six) hours as needed for wheezing or shortness of breath. 01/30/18  Yes Lindell Spar I, NP  ARIPiprazole ER (ABILIFY MAINTENA) 400 MG PRSY prefilled syringe Inject 400 mg into the muscle every 28 (twenty-eight) days.   Yes [provider]  benztropine (COGENTIN) 1 MG tablet Take 1 tablet (1 mg total) by mouth 2 (two) times daily as needed (EPS). Patient taking differently: Take 1 mg by mouth daily.  01/30/18  Yes Lindell Spar I, NP  budesonide-formoterol (SYMBICORT) 160-4.5 MCG/ACT inhaler Inhale 2 puffs into the lungs 2 (two) times daily. For Shortness of breath 01/30/18  Yes N[place], Herbert Pun I, NP  divalproex (DEPAKOTE ER) 500 MG 24 hr tablet Take 1 tablet (500 mg total) by mouth 2 (two) times daily.  For mood stabilization Patient taking differently: Take 500-1,000 mg by mouth 2 (two) times daily. Take 500 mg - 1000 mg in the morning. Take 1000 mg in the evening 01/30/18 03/12/20 Yes [place], Herbert Pun I, NP  hydrOXYzine (ATARAX/VISTARIL) 25 MG tablet Take 1 tablet (25 mg total) by mouth 3 (three) times daily as needed for anxiety. 01/30/18  Yes Lindell Spar I, NP  traZODone (DESYREL) 100 MG tablet Take 1 tablet (100 mg total) by mouth at bedtime. For sleep Patient taking differently: Take 100 mg by mouth at bedtime as needed for sleep.  01/30/18  Yes Nwoko, Herbert Pun I, NP  mometasone-formoterol (DULERA) 200-5 MCG/ACT AERO Inhale 2 puffs into the lungs 2 (two) times daily. For asthma Patient not taking: Reported on 08/26/2018 01/30/18   Lindell Spar I, NP  montelukast (SINGULAIR) 10 MG tablet Take 1 tablet (10 mg total) by mouth at bedtime. For  asthma Patient not taking: Reported on 08/26/2018 01/30/18   Lindell Spar I, NP  potassium chloride (K-DUR) 10 MEQ tablet Take 1 tablet (10 mEq total) by mouth daily. For low potassium replacement Patient not taking: Reported on 08/26/2018 01/30/18   Encarnacion Slates, NP    Family History Family History  Problem Relation Age of Onset  . Depression Mother   . Gout Mother   . Cancer Father        prostate  . Other Father        lung issue  . Alcoholism Other   . Heart attack Paternal Grandfather   . Heart attack Paternal Grandmother   . Heart attack Maternal Grandmother   . Heart attack Maternal Grandfather   . Depression Son   . Anxiety disorder Son     Social History Social History   Tobacco Use  . Smoking status: Former Smoker    Types: Cigarettes  . Smokeless tobacco: Never Used  Substance Use Topics  . Alcohol use: Yes    Comment: BAC not available at time of assessment  . Drug use: No     Allergies   Haldol [haloperidol decanoate]; Haloperidol; Penicillins; Pollen extract; and Shrimp [shellfish allergy]   Review of Systems Review of Systems  Constitutional: Negative for chills and fever.  HENT: Negative.   Eyes: Negative for visual disturbance.  Respiratory: Negative for cough and shortness of breath.   Cardiovascular: Negative for chest pain.  Gastrointestinal: Negative for abdominal pain, nausea and vomiting.  Genitourinary: Negative for dysuria and frequency.  Musculoskeletal: Positive for arthralgias. Negative for joint swelling and myalgias.  Skin: Positive for wound. Negative for color change.  Neurological: Negative for dizziness, syncope, light-headedness and headaches.  All other systems reviewed and are negative.    Physical Exam Updated Vital Signs BP 119/84 (BP Location: Left Arm)   Pulse 89   Temp 98.2 F (36.8 C) (Oral)   Resp 18   Ht 5\' 6"  (1.676 m)   Wt 108.4 kg   LMP 08/12/2018   SpO2 99%   BMI 38.58 kg/m   Physical Exam Vitals  signs and nursing note reviewed.  Constitutional:      General: She is not in acute distress.    Appearance: She is well-developed. She is not diaphoretic.  HENT:     Head: Normocephalic and atraumatic.     Comments: No evidence of head trauma    Mouth/Throat:     Mouth: Mucous membranes are moist.     Pharynx: Oropharynx is clear.  Eyes:     General:  Right eye: No discharge.        Left eye: No discharge.     Pupils: Pupils are equal, round, and reactive to light.  Neck:     Musculoskeletal: Neck supple.  Cardiovascular:     Rate and Rhythm: Normal rate and regular rhythm.     Heart sounds: Normal heart sounds.  Pulmonary:     Effort: Pulmonary effort is normal. No respiratory distress.     Breath sounds: Normal breath sounds. No wheezing or rales.     Comments: Respirations equal and unlabored, patient able to speak in full sentences, lungs clear to auscultation bilaterally Abdominal:     General: Bowel sounds are normal. There is no distension.     Palpations: Abdomen is soft. There is no mass.     Tenderness: There is no abdominal tenderness. There is no guarding.     Comments: Abdomen soft, nondistended, nontender to palpation in all quadrants without guarding or peritoneal signs  Musculoskeletal:     Comments: Superficial abrasions to bilateral palms with no active bleeding.  2+ radial pulse and good capillary refill bilaterally, normal sensation and range of motion, no palpable bony deformity or tenderness. Mild tenderness over the right knee with no palpable deformity, no overlying erythema, swelling or warmth.  No wounds or abrasions.  Full flexion and extension without pain.  Distal pulses intact.  Skin:    General: Skin is warm and dry.     Capillary Refill: Capillary refill takes less than 2 seconds.  Neurological:     Mental Status: She is alert. Mental status is at baseline.     Coordination: Coordination normal.     Comments: Speech is clear, able to follow  commands CN III-XII intact Normal strength in upper and lower extremities bilaterally including dorsiflexion and plantar flexion, strong and equal grip strength Sensation normal to light and sharp touch Moves extremities without ataxia, coordination intact   Psychiatric:        Attention and Perception: She is inattentive.        Mood and Affect: Mood normal. Affect is labile.        Speech: Speech is rapid and pressured.        Behavior: Behavior is hyperactive. Behavior is cooperative.        Thought Content: Thought content does not include homicidal or suicidal ideation.     Comments: Patient exhibits tangential thinking and flight of ideas with disorganized thinking although patient is cooperative.  She has rapid and pressured speech and appears inattentive.  Is not obviously responding to internal stimuli.      ED Treatments / Results  Labs (all labs ordered are listed, but only abnormal results are displayed) Labs Reviewed  COMPREHENSIVE METABOLIC PANEL - Abnormal; Notable for the following components:      Result Value   Glucose, Bld 107 (*)    All other components within normal limits  ACETAMINOPHEN LEVEL - Abnormal; Notable for the following components:   Acetaminophen (Tylenol), Serum <10 (*)    All other components within normal limits  ETHANOL  SALICYLATE LEVEL  CBC  RAPID URINE DRUG SCREEN, HOSP PERFORMED  I-STAT BETA HCG BLOOD, ED ([facility], [facility], [facility] ONLY)    EKG EKG Interpretation  Date/Time:  Monday August 26 2018 03:48:29 EST Ventricular Rate:  84 PR Interval:    QRS Duration: 83 QT Interval:  370 QTC Calculation: 438 R Axis:   -17 Text Interpretation:  Sinus rhythm Borderline left axis deviation Low  voltage, extremity leads Consider anterior infarct No significant change was found Confirmed by [place], John [phone]) on 08/26/2018 4:00:23 AM   Radiology No results found.  Procedures Procedures (including critical care time)  Medications Ordered in  ED Medications  bacitracin ointment (1 application Topical Given 08/26/18 0432)  divalproex (DEPAKOTE ER) 24 hr tablet 500 mg (has no administration in time range)  hydrOXYzine (ATARAX/VISTARIL) tablet 25 mg (has no administration in time range)  traZODone (DESYREL) tablet 100 mg (has no administration in time range)  OLANZapine (ZYPREXA) tablet 10 mg (has no administration in time range)     Initial Impression / Assessment and Plan / ED Course  I have reviewed the triage vital signs and the nursing notes.  Pertinent labs & imaging results that were available during my care of the patient were reviewed by me and considered in my medical decision making (see chart for details).  47 year old female with known psychiatric history, presents reporting a fall, superficial scrapes to bilateral palms which were cleaned and dressed appropriately, no palpable bony deformity and hands are neurovascularly intact.  Some mild tenderness reported over the right knee but no obvious bony deformity, normal range of motion and weightbearing, do not feel that x-ray is necessary.  Arrival patient with disorganized thinking and flight of ideas, with rapid and pressured speech concerning for decompensated schizophrenia.  Patient denying SI, HI or AVH.  Medical screening labs obtained.  Patient denies any focal medical complaints aside from her fall, patient is unable to provide detailed information regarding the fall but does not think she lost consciousness, EKG without evidence of arrhythmia or concerning ischemic changes and medical screening labs overall reassuring, no leukocytosis, normal hemoglobin, no acute electrolyte derangements requiring intervention, normal renal and liver function, negative acetaminophen, salicylate and ethanol levels and negative UDS.  At this time patient is medically cleared for psychiatric evaluation.  TTS has seen and evaluated the patient, they feel the patient meets inpatient criteria,  and she will be accepted to the behavioral health hospital at 8 AM this morning.  Final Clinical Impressions(s) / ED Diagnoses   Final diagnoses:  Psychosis, unspecified psychosis type ([place])  Abrasion of hand, unspecified laterality, initial encounter    ED Discharge Orders    None       Janet Berlin 08/26/18 0827    Shanon Rosser, MD 08/26/18 2251
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
## 2018-08-27 LAB — LIPID PANEL
Cholesterol: 179 mg/dL (ref 0–200)
HDL: 57 mg/dL (ref >40)
LDL Cholesterol: 111 mg/dL — ABNORMAL HIGH (ref 0–99)
Total CHOL/HDL Ratio: 3.1 RATIO
Triglycerides: 54 mg/dL (ref <150)
VLDL: 11 mg/dL (ref 0–40)

## 2018-08-27 LAB — HEMOGLOBIN A1C
Hgb A1c MFr Bld: 5 % (ref 4.8–5.6)
MEAN PLASMA GLUCOSE: 96.8 mg/dL

## 2018-08-27 LAB — TSH: TSH: 0.748 u[IU]/mL (ref 0.350–4.500)

## 2018-08-27 MED ORDER — ZIPRASIDONE MESYLATE 20 MG IM SOLR: 20.0000 mg | INTRAMUSCULAR | Status: DC | PRN

## 2018-08-27 MED ORDER — RISPERIDONE 3 MG PO TABS
3.0000 mg | ORAL_TABLET | Freq: Two times a day (BID) | ORAL | Status: DC
  Administered 2018-08-27 – 2018-08-30 (×6): 3 mg via ORAL
  Filled 2018-08-27 (×3): qty 1
  Filled 2018-08-27: qty 3
  Filled 2018-08-27 (×5): qty 1

## 2018-08-27 MED ORDER — TRAZODONE HCL 100 MG PO TABS
100.0000 mg | ORAL_TABLET | Freq: Every evening | ORAL | Status: DC | PRN
  Administered 2018-08-28 – 2018-08-29 (×2): 100 mg via ORAL
  Filled 2018-08-27 (×10): qty 1

## 2018-08-27 MED ORDER — HYDROXYZINE HCL 50 MG PO TABS: 50.0000 mg | ORAL_TABLET | Freq: Three times a day (TID) | ORAL | Status: DC | PRN

## 2018-08-27 MED ORDER — LORAZEPAM 1 MG PO TABS
1.0000 mg | ORAL_TABLET | Freq: Once | ORAL | Status: AC
  Administered 2018-08-27: 1 mg via ORAL
  Filled 2018-08-27: qty 1

## 2018-08-27 MED ORDER — OLANZAPINE 10 MG PO TBDP: 10.0000 mg | ORAL_TABLET | Freq: Three times a day (TID) | ORAL | Status: DC | PRN

## 2018-08-27 MED ORDER — LORAZEPAM 1 MG PO TABS: 1.0000 mg | ORAL_TABLET | ORAL | Status: DC | PRN
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
## 2018-08-27 NOTE — Progress Notes (Signed)
Recreation Therapy Notes  Date: 12.31.19 Time: 1000 Location: 500 Hall Dayroom  Group Topic: Goal Setting  Goal Area(s) Addresses:  Patient will be able to identify at least 3 goals for the new year. Patient will be able to identify benefit of investing in themselves.  Patient will be able to identify benefit of setting goals.   Behavioral Response:  Engaged  Intervention: Worksheet  Activity: Outlook for the [employer].  Patients were to identify what they want in the new year, what they need, what they want to share and what they want to succeed at in the new year.  Education:  Discharge Planning, Coping Skills, Goal Setting  Education Outcome: Acknowledges Education/In Group Clarification Provided/Needs Additional Education  Clinical Observations: Pt stated her word for the upcoming new year is "prosperity".  Pt stated she wanted to be free from "financial distress, spiritual darkness, violence, abuse and acting violently towards others".  Pt explained she needs "peace of mind, tranquility and make a difference" in the new year.  Pt expressed she wants to share loyalty, unity, better attitude, better education and better use of resources.  Lastly, pt expressed she wants to succeed in being financially responsible and responsible overall.    Victorino Sparrow, LRT/CTRS     Victorino Sparrow A 08/27/2018 10:42 AM

## 2018-08-27 NOTE — Plan of Care (Signed)
Progress note  D: pt found in bed; compliant with medication administration. Pt denies any si/hi/ah/vh and verbally agrees to approach staff if these become apparent or before harming herself or others while at [facility]. Pt denies any physical pain, rating this a 0/10. Pt denies any physical symptoms either. Pt is still tangential and pressured in her speech pattern. Pt has been in bed most of the day. A: pt provided support and encouragement. Pt given medication per protocol and standing orders. Q49m safety checks implemented and continued.  R: pt safe on the unit. Will continue to monitor.   Pt progressing in the following metrics  Problem: Education: Goal: Knowledge of [place] [facility] Education information/materials will improve Outcome: Progressing Goal: Emotional status will improve Outcome: Progressing Goal: Verbalization of understanding the information provided will improve Outcome: Progressing   Problem: Activity: Goal: Sleeping patterns will improve Outcome: Progressing

## 2018-08-27 NOTE — BHH Suicide Risk Assessment (Signed)
[place] INPATIENT:  Family/Significant Other Suicide Prevention Education  Suicide Prevention Education:  Education Completed; with Leyan Branden [phone], has been identified by the patient as the family member/significant other with whom the patient will be residing, and identified as the person(s) who will aid the patient in the event of a mental health crisis (suicidal ideations/suicide attempt).  With written consent from the patient, the family member/significant other has been provided the following suicide prevention education, prior to the and/or following the discharge of the patient.  The suicide prevention education provided includes the following:  Suicide risk factors  Suicide prevention and interventions  National Suicide Hotline telephone number  [facility] assessment telephone number  [facility] Emergency Assistance [place] and/or Residential Mobile Crisis Unit telephone number  Request made of family/significant other to:  Remove weapons (e.g., guns, rifles, knives), all items previously/currently identified as safety concern.    Remove drugs/medications (over-the-counter, prescriptions, illicit drugs), all items previously/currently identified as a safety concern.  The family member/significant other verbalizes understanding of the suicide prevention education information provided.  The family member/significant other agrees to remove the items of safety concern listed above.  [name] father reports the patient has been "doing well" for the last couple months since her last [facility] admission in May 2019. Per father, the patient's mood and mental illness tend to worsen around the holidays and the patient will starts to argue with neighbors and family.   Father reports that the patient has shown progress in the last couple months, the patient hasn't had a car or driver's license for about 12 years and recently obtained both. Father speculates  that costs associated with the car and repairs might be contributing to patient stressors.  No safety concerns from father, he hopes she can continue to improve and return home soon.  Joellen Jersey 08/27/2018, 3:14 PM
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
## 2018-08-27 NOTE — BHH Suicide Risk Assessment (Signed)
[place] INPATIENT:  Family/Significant Other Suicide Prevention Education  Suicide Prevention Education:  Contact Attempts: father, Jakita Dutkiewicz [phone] has been identified by the patient as the family member/significant other with whom the patient will be residing, and identified as the person(s) who will aid the patient in the event of a mental health crisis.  With written consent from the patient, two attempts were made to provide suicide prevention education, prior to and/or following the patient's discharge.  We were unsuccessful in providing suicide prevention education.  A suicide education pamphlet was given to the patient to share with family/significant other.  Date and time of first attempt: 08/27/18 at 2:24pm. Voicemail did not indicate owner, HIPPA compliant VM left  Date and time of second attempt: To be completed at a later time.   Joellen Jersey 08/27/2018, 2:26 PM

## 2018-08-27 NOTE — Progress Notes (Signed)
D:  Izola was in her room all evening.  She was irritable to be woken up for assessment and was minimal in her interaction.  She did get up to take her hs medications and obtain evening snack.  She denied A/V hallucinations.  She reported passive SI and agreed to seek out staff if the thoughts worsen.  She was adamant that she did not want to take haldol and reassured her that haldol was not scheduled tonight.  She did take her hs medications without difficulty.  She denied any pain or discomfort and appeared to be in no physical distress.  She is currently resting with her eyes closed and appeared to be in no physical distress.  A:  1:1 with RN for support and encouragement.  Medications as ordered.  Q 15 minute checks maintained for safety.  Encouraged participation in group and unit activities.   R:  Kaleea remains safe on the unit.  We will continue to monitor the progress towards her goals.

## 2018-08-27 NOTE — BHH Group Notes (Signed)
LCSW Group Therapy Notes    Type of Therapy and Topic: Group Therapy: Effective Communication   Participation Level: Active   Description of Group:  In this group patients will be asked to identify their own styles of communication as well as defining and identifying passive, assertive, and aggressive styles of communication. Participants will identify strategies to communicate in a more assertive manner in an effort to appropriately meet their needs. This group will be process-oriented, with patients participating in exploration of their own experiences as well as giving and receiving support and challenge from other group members.   Therapeutic Goals: 1. Patient will identify their personal communication style. 2. Patient will identify passive, assertive, and aggressive forms of communication. 3. Patient will identify strategies for developing more effective communication to appropriately meet their needs.    Summary of Patient Progress Patient very engaged in group. Patient identified that her style of communication varies situationally; she has been assertive at work and tends to be passive or passive-aggressive with family and loved ones as she identifies them as aggressive.    Therapeutic Modalities:  Communication Skills Solution Focused Therapy Motivational [place], MSW, LCSWA

## 2018-08-28 LAB — PROLACTIN: Prolactin: 7.5 ng/mL (ref 4.8–23.3)

## 2018-08-28 MED ORDER — DIVALPROEX SODIUM 500 MG PO DR TAB
500.0000 mg | DELAYED_RELEASE_TABLET | Freq: Every day | ORAL | Status: DC
  Administered 2018-08-29 – 2018-08-30 (×2): 500 mg via ORAL
  Filled 2018-08-28 (×4): qty 1

## 2018-08-28 MED ORDER — DIVALPROEX SODIUM 250 MG PO DR TAB
750.0000 mg | DELAYED_RELEASE_TABLET | Freq: Every day | ORAL | Status: DC
  Administered 2018-08-28 – 2018-08-29 (×2): 750 mg via ORAL
  Filled 2018-08-28 (×4): qty 3

## 2018-08-28 NOTE — Progress Notes (Signed)
Nursing Progress Note: 7p-7a D: Pt currently presents with a depressed/concrete/hyper religious affect and behavior. Interacting appropriately with the milieu. Pt reports good sleep during the previous night with current medication regimen.   A: Pt provided with medications per providers orders. Pt's labs and vitals were monitored throughout the night. Pt supported emotionally and encouraged to express concerns and questions. Pt educated on medications.  R: Pt's safety ensured with 15 minute and environmental checks. Pt currently denies SI, HI, and AVH. Pt verbally contracts to seek staff if SI,HI, or AVH occurs and to consult with staff before acting on any harmful thoughts. Will continue to monitor.

## 2018-08-28 NOTE — Tx Team (Signed)
Interdisciplinary Treatment and Diagnostic Plan Update  08/28/2018 Time of Session: 9:00am Kiani Wurtzel MRN: [id number]  Principal Diagnosis: Schizoaffective disorder, bipolar type [facility])  Secondary Diagnoses: Principal Problem:   Schizoaffective disorder, bipolar type ([place])   Current Medications:  Current Facility-Administered Medications  Medication Dose Route Frequency Provider Last Rate Last Dose  . acetaminophen (TYLENOL) tablet 650 mg  650 mg Oral Q6H PRN Lindon Romp A, NP      . albuterol (PROVENTIL HFA;VENTOLIN HFA) 108 (90 Base) MCG/ACT inhaler 2 puff  2 puff Inhalation Q6H PRN Nwoko, Agnes I, NP      . alum & mag hydroxide-simeth (MAALOX/MYLANTA) 200-200-20 MG/5ML suspension 30 mL  30 mL Oral Q4H PRN Rozetta Nunnery, NP      . bacitracin ointment   Topical BID Rozetta Nunnery, NP      . Derrill Memo ON 08/29/2018] divalproex (DEPAKOTE) DR tablet 500 mg  500 mg Oral Daily [name], MD      . divalproex (DEPAKOTE) DR tablet 750 mg  750 mg Oral QHS [name], MD      . hydrOXYzine (ATARAX/VISTARIL) tablet 50 mg  50 mg Oral TID PRN Laverle Hobby, PA-C      . OLANZapine zydis (ZYPREXA) disintegrating tablet 10 mg  10 mg Oral Q8H PRN Laverle Hobby, PA-C       And  . LORazepam (ATIVAN) tablet 1 mg  1 mg Oral PRN Patriciaann Clan E, PA-C       And  . ziprasidone (GEODON) injection 20 mg  20 mg Intramuscular PRN Patriciaann Clan E, PA-C      . magnesium hydroxide (MILK OF MAGNESIA) suspension 30 mL  30 mL Oral Daily PRN Lindon Romp A, NP      . mometasone-formoterol (DULERA) 200-5 MCG/ACT inhaler 2 puff  2 puff Inhalation BID Lindell Spar I, NP   2 puff at 08/28/18 0826  . montelukast (SINGULAIR) tablet 10 mg  10 mg Oral QHS Lindell Spar I, NP   10 mg at 08/27/18 2351  . risperiDONE (RISPERDAL) tablet 3 mg  3 mg Oral BID [name], MD   3 mg at 08/28/18 3244  . traZODone (DESYREL) tablet 100 mg  100 mg Oral QHS,MR X 1 Laverle Hobby, PA-C   Stopped at 08/28/18 0200   PTA  Medications: Medications Prior to Admission  Medication Sig Dispense Refill Last Dose  . albuterol (PROVENTIL HFA;VENTOLIN HFA) 108 (90 Base) MCG/ACT inhaler Inhale 2 puffs into the lungs every 6 (six) hours as needed for wheezing or shortness of breath. 1 Inhaler 0 unk  . ARIPiprazole ER (ABILIFY MAINTENA) 400 MG PRSY prefilled syringe Inject 400 mg into the muscle every 28 (twenty-eight) days.   November  . benztropine (COGENTIN) 1 MG tablet Take 1 tablet (1 mg total) by mouth 2 (two) times daily as needed (EPS). (Patient taking differently: Take 1 mg by mouth daily. ) 60 tablet 0 Past Week at Unknown time  . budesonide-formoterol (SYMBICORT) 160-4.5 MCG/ACT inhaler Inhale 2 puffs into the lungs 2 (two) times daily. For Shortness of breath 1 Inhaler 0 08/25/2018 at Unknown time  . divalproex (DEPAKOTE ER) 500 MG 24 hr tablet Take 1 tablet (500 mg total) by mouth 2 (two) times daily. For mood stabilization (Patient taking differently: Take 500-1,000 mg by mouth 2 (two) times daily. Take 500 mg - 1000 mg in the morning. Take 1000 mg in the evening) 60 tablet 0 08/25/2018 at Unknown time  . hydrOXYzine (ATARAX/VISTARIL) 25  MG tablet Take 1 tablet (25 mg total) by mouth 3 (three) times daily as needed for anxiety. 60 tablet 0 unk  . mometasone-formoterol (DULERA) 200-5 MCG/ACT AERO Inhale 2 puffs into the lungs 2 (two) times daily. For asthma (Patient not taking: Reported on 08/26/2018) 1 Inhaler 0 Not Taking at Unknown time  . montelukast (SINGULAIR) 10 MG tablet Take 1 tablet (10 mg total) by mouth at bedtime. For asthma (Patient not taking: Reported on 08/26/2018) 30 tablet 0 Not Taking at Unknown time  . potassium chloride (K-DUR) 10 MEQ tablet Take 1 tablet (10 mEq total) by mouth daily. For low potassium replacement (Patient not taking: Reported on 08/26/2018) 30 tablet 0 Completed Course at Unknown time  . traZODone (DESYREL) 100 MG tablet Take 1 tablet (100 mg total) by mouth at bedtime. For sleep  (Patient taking differently: Take 100 mg by mouth at bedtime as needed for sleep. ) 30 tablet 0 Past Week at Unknown time    Patient Stressors:    Patient Strengths:    Treatment Modalities: Medication Management, Group therapy, Case management,  1 to 1 session with clinician, Psychoeducation, Recreational therapy.   Physician Treatment Plan for Primary Diagnosis: Schizoaffective disorder, bipolar type ([place]) Long Term Goal(s): Improvement in symptoms so as ready for discharge Improvement in symptoms so as ready for discharge   Short Term Goals: Ability to identify changes in lifestyle to reduce recurrence of condition will improve Ability to verbalize feelings will improve Ability to identify and develop effective coping behaviors will improve Compliance with prescribed medications will improve Ability to identify triggers associated with substance abuse/mental health issues will improve  Medication Management: Evaluate patient's response, side effects, and tolerance of medication regimen.  Therapeutic Interventions: 1 to 1 sessions, Unit Group sessions and Medication administration.  Evaluation of Outcomes: Progressing  Physician Treatment Plan for Secondary Diagnosis: Principal Problem:   Schizoaffective disorder, bipolar type ([place])  Long Term Goal(s): Improvement in symptoms so as ready for discharge Improvement in symptoms so as ready for discharge   Short Term Goals: Ability to identify changes in lifestyle to reduce recurrence of condition will improve Ability to verbalize feelings will improve Ability to identify and develop effective coping behaviors will improve Compliance with prescribed medications will improve Ability to identify triggers associated with substance abuse/mental health issues will improve     Medication Management: Evaluate patient's response, side effects, and tolerance of medication regimen.  Therapeutic Interventions: 1 to 1 sessions, Unit Group  sessions and Medication administration.  Evaluation of Outcomes: Progressing   RN Treatment Plan for Primary Diagnosis: Schizoaffective disorder, bipolar type ([place]) Long Term Goal(s): Knowledge of disease and therapeutic regimen to maintain health will improve  Short Term Goals: Ability to verbalize frustration and anger appropriately will improve, Ability to demonstrate self-control, Ability to verbalize feelings will improve, Ability to disclose and discuss suicidal ideas and Compliance with prescribed medications will improve  Medication Management: RN will administer medications as ordered by provider, will assess and evaluate patient's response and provide education to patient for prescribed medication. RN will report any adverse and/or side effects to prescribing provider.  Therapeutic Interventions: 1 on 1 counseling sessions, Psychoeducation, Medication administration, Evaluate responses to treatment, Monitor vital signs and CBGs as ordered, Perform/monitor CIWA, COWS, AIMS and Fall Risk screenings as ordered, Perform wound care treatments as ordered.  Evaluation of Outcomes: Progressing   LCSW Treatment Plan for Primary Diagnosis: Schizoaffective disorder, bipolar type ([place]) Long Term Goal(s): Safe transition to appropriate next level  of care at discharge, Engage patient in therapeutic group addressing interpersonal concerns.  Short Term Goals: Engage patient in aftercare planning with referrals and resources, Increase social support, Increase ability to appropriately verbalize feelings, Increase emotional regulation and Increase skills for wellness and recovery  Therapeutic Interventions: Assess for all discharge needs, 1 to 1 time with Social worker, Explore available resources and support systems, Assess for adequacy in community support network, Educate family and significant other(s) on suicide prevention, Complete Psychosocial Assessment, Interpersonal group  therapy.  Evaluation of Outcomes: Progressing   Progress in Treatment: Attending groups: Yes. Participating in groups: Yes. Taking medication as prescribed: Yes. Toleration medication: Yes. Family/Significant other contact made: Yes, individual(s) contacted:  father Patient understands diagnosis: Yes. Discussing patient identified problems/goals with staff: Yes. Medical problems stabilized or resolved: No. Denies suicidal/homicidal ideation: No. Issues/concerns per patient self-inventory: No.  New problem(s) identified: Yes, Describe:  patient still acting somewhat bizarre and making odd, hyper-sexual comments. Patient showing some improvement  New Short Term/Long Term Goal(s):  medication management for mood stabilization; elimination of SI thoughts; development of comprehensive mental wellness/sobriety plan.  Patient Goals: Patient not able to identify a goal. "Nothing to going to help me when I get like this."  Discharge Plan or Barriers: Patient expected to discharge home and follow up with outpatient providers. [place] pamphlet, Mobile Crisis information, and AA/NA information provided to patient for additional community support and resources.   Reason for Continuation of Hospitalization: Anxiety Delusions  Depression Medication stabilization Suicidal ideation  Estimated Length of Stay: 3-5 days  Attendees: Patient: 08/28/2018 9:34 AM  Physician: Dr.Farah 08/28/2018 9:34 AM  Nursing:  08/28/2018 9:34 AM  RN Care Manager: 08/28/2018 9:34 AM  Social Worker: Stephanie Acre, [place] 08/28/2018 9:34 AM  Recreational Therapist:  08/28/2018 9:34 AM  Other:  08/28/2018 9:34 AM  Other:  08/28/2018 9:34 AM  Other: 08/28/2018 9:34 AM    Scribe for Treatment Team: Joellen Jersey, [place] 08/28/2018 9:34 AM

## 2018-08-28 NOTE — Progress Notes (Signed)
[facility] MD Progress Note  08/28/2018 9:28 AM [name]  MRN:  [id number] Subjective:   Patient did have a verbal altercation with another patient last night she states "I had an outburst" but she is contained now in mood and behavior has shown some improvement.  She denies wanting to harm herself she denies wanting to harm others he generally stayed to herself. Speech is less disjointed denies auditory and visual hallucinations No paranoia discerned, no EPS or TD  Principal Problem: Schizoaffective disorder, bipolar type (HCC) Diagnosis: Principal Problem:   Schizoaffective disorder, bipolar type ([place])  Total Time spent with patient: 20 minutes Past Medical History:  Past Medical History:  Diagnosis Date  . Anemia   . Anxiety   . Asthma   . Bipolar 1 disorder ([place])   . Breast cancer ([place])   . Depression   . Diabetes mellitus without complication ([place])   . Hypertension   . Insomnia, persistent   . Schizophrenic disorder ([place])   . Seizures ([place])     Past Surgical History:  Procedure Laterality Date  . BREAST SURGERY Left   . TONSILLECTOMY     Family History:  Family History  Problem Relation Age of Onset  . Depression Mother   . Gout Mother   . Cancer Father        prostate  . Other Father        lung issue  . Alcoholism Other   . Heart attack Paternal Grandfather   . Heart attack Paternal Grandmother   . Heart attack Maternal Grandmother   . Heart attack Maternal Grandfather   . Depression Son   . Anxiety disorder Son    Social History:  Social History   Substance and Sexual Activity  Alcohol Use Yes   Comment: BAC not available at time of assessment     Social History   Substance and Sexual Activity  Drug Use No    Social History   Socioeconomic History  . Marital status: Single    Spouse name: Not on file  . Number of children: Not on file  . Years of education: Not on file  . Highest education level: Not on file  Occupational History  . Not on  file  Social Needs  . Financial resource strain: Not on file  . Food insecurity:    Worry: Not on file    Inability: Not on file  . Transportation needs:    Medical: Not on file    Non-medical: Not on file  Tobacco Use  . Smoking status: Former Smoker    Types: Cigarettes  . Smokeless tobacco: Never Used  Substance and Sexual Activity  . Alcohol use: Yes    Comment: BAC not available at time of assessment  . Drug use: No  . Sexual activity: Never    Birth control/protection: Pill  Lifestyle  . Physical activity:    Days per week: Not on file    Minutes per session: Not on file  . Stress: Not on file  Relationships  . Social connections:    Talks on phone: Not on file    Gets together: Not on file    Attends religious service: Not on file    Active member of club or organization: Not on file    Attends meetings of clubs or organizations: Not on file    Relationship status: Not on file  Other Topics Concern  . Not on file  Social History Narrative  . Not on  file   Additional Social History:                         Sleep: Fair  Appetite:  Good  Current Medications: Current Facility-Administered Medications  Medication Dose Route Frequency Provider Last Rate Last Dose  . acetaminophen (TYLENOL) tablet 650 mg  650 mg Oral Q6H PRN Lindon Romp A, NP      . albuterol (PROVENTIL HFA;VENTOLIN HFA) 108 (90 Base) MCG/ACT inhaler 2 puff  2 puff Inhalation Q6H PRN Nwoko, Agnes I, NP      . alum & mag hydroxide-simeth (MAALOX/MYLANTA) 200-200-20 MG/5ML suspension 30 mL  30 mL Oral Q4H PRN Rozetta Nunnery, NP      . bacitracin ointment   Topical BID Rozetta Nunnery, NP      . Derrill Memo ON 08/29/2018] divalproex (DEPAKOTE) DR tablet 500 mg  500 mg Oral Daily [name], MD      . divalproex (DEPAKOTE) DR tablet 750 mg  750 mg Oral QHS [name], MD      . hydrOXYzine (ATARAX/VISTARIL) tablet 50 mg  50 mg Oral TID PRN Laverle Hobby, PA-C      . OLANZapine zydis  (ZYPREXA) disintegrating tablet 10 mg  10 mg Oral Q8H PRN Laverle Hobby, PA-C       And  . LORazepam (ATIVAN) tablet 1 mg  1 mg Oral PRN Patriciaann Clan E, PA-C       And  . ziprasidone (GEODON) injection 20 mg  20 mg Intramuscular PRN Patriciaann Clan E, PA-C      . magnesium hydroxide (MILK OF MAGNESIA) suspension 30 mL  30 mL Oral Daily PRN Lindon Romp A, NP      . mometasone-formoterol (DULERA) 200-5 MCG/ACT inhaler 2 puff  2 puff Inhalation BID Lindell Spar I, NP   2 puff at 08/28/18 0826  . montelukast (SINGULAIR) tablet 10 mg  10 mg Oral QHS Lindell Spar I, NP   10 mg at 08/27/18 2351  . risperiDONE (RISPERDAL) tablet 3 mg  3 mg Oral BID [name], MD   3 mg at 08/28/18 4098  . traZODone (DESYREL) tablet 100 mg  100 mg Oral QHS,MR X 1 Laverle Hobby, PA-C   Stopped at 08/28/18 0200    Lab Results:  Results for orders placed or performed during the hospital encounter of 08/26/18 (from the past 48 hour(s))  Prolactin     Status: None   Collection Time: 08/27/18  6:46 AM  Result Value Ref Range   Prolactin 7.5 4.8 - 23.3 ng/mL    Comment: (NOTE) Performed At: [facility] [place], [place] [id number] Rush Farmer MD FA:[id number]   Lipid panel     Status: Abnormal   Collection Time: 08/27/18  6:46 AM  Result Value Ref Range   Cholesterol 179 0 - 200 mg/dL   Triglycerides 54 <150 mg/dL   HDL 57 >40 mg/dL   Total CHOL/HDL Ratio 3.1 RATIO   VLDL 11 0 - 40 mg/dL   LDL Cholesterol 111 (H) 0 - 99 mg/dL    Comment:        Total Cholesterol/HDL:CHD Risk Coronary Heart Disease Risk Table                     Men   Women  1/2 Average Risk   3.4   3.3  Average Risk       5.0   4.4  2 X Average Risk   9.6   7.1  3 X Average Risk  23.4   11.0        Use the calculated Patient Ratio above and the CHD Risk Table to determine the patient's CHD Risk.        ATP III CLASSIFICATION (LDL):  <100     mg/dL   Optimal  100-129  mg/dL   Near or Above                     Optimal  130-159  mg/dL   Borderline  160-189  mg/dL   High  >190     mg/dL   Very High Performed at [facility] [address], [place], [place] [address]   Hemoglobin A1c     Status: None   Collection Time: 08/27/18  6:46 AM  Result Value Ref Range   Hgb A1c MFr Bld 5.0 4.8 - 5.6 %    Comment: (NOTE) Pre diabetes:          5.7%-6.4% Diabetes:              >6.4% Glycemic control for   <7.0% adults with diabetes    Mean Plasma Glucose 96.8 mg/dL    Comment: Performed at [facility] [address], [place], [place] [address]  TSH     Status: None   Collection Time: 08/27/18  6:46 AM  Result Value Ref Range   TSH 0.748 0.350 - 4.500 uIU/mL    Comment: Performed by a 3rd Generation assay with a functional sensitivity of <=0.01 uIU/mL. Performed at [facility], [place] [address] [place], [place] [address]     Blood Alcohol level:  Lab Results  Component Value Date   ETH <10 08/26/2018   ETH <10 48/18/5631    Metabolic Disorder Labs: Lab Results  Component Value Date   HGBA1C 5.0 08/27/2018   MPG 96.8 08/27/2018   MPG 96.8 11/21/2017   Lab Results  Component Value Date   PROLACTIN 7.5 08/27/2018   PROLACTIN 5.4 11/21/2017   Lab Results  Component Value Date   CHOL 179 08/27/2018   TRIG 54 08/27/2018   HDL 57 08/27/2018   CHOLHDL 3.1 08/27/2018   VLDL 11 08/27/2018   LDLCALC 111 (H) 08/27/2018   LDLCALC 115 (H) 11/21/2017    Physical Findings: AIMS: Facial and Oral Movements Muscles of Facial Expression: None, normal Lips and Perioral Area: None, normal Jaw: None, normal Tongue: None, normal,Extremity Movements Upper (arms, wrists, hands, fingers): None, normal Lower (legs, knees, ankles, toes): None, normal, Trunk Movements Neck, shoulders, hips: None, normal, Overall Severity Severity of abnormal movements (highest score from questions above): None, normal Incapacitation due to abnormal movements:  None, normal [name] awareness of abnormal movements (rate only patient's report): No Awareness, Dental Status Current problems with teeth and/or dentures?: No Does patient usually wear dentures?: No  CIWA:  CIWA-Ar Total: 8 COWS:  COWS Total Score: 4  Musculoskeletal: Strength & Muscle Tone: within normal limits Gait & Station: normal Patient leans: N/A  Psychiatric Specialty Exam: Physical Exam  ROS  Blood pressure 110/74, pulse 80, temperature 98.2 F (36.8 C), temperature source Oral, resp. rate 18, height 5\' 6"  (1.676 m), weight 104.3 kg, last menstrual period 08/12/2018, SpO2 100 %.Body mass index is 37.12 kg/m.  General Appearance: Disheveled  Eye Contact:  Fair  Speech:  Slow  Volume:  Decreased  Mood:  Euthymic  Affect:  Congruent  Thought Process:  Descriptions of Associations: Loose  Orientation:  Full (Time, Place, and Person)  Thought Content:  Tangential  Suicidal Thoughts:  No  Homicidal Thoughts:  No  Memory:  Immediate;   Fair  Judgement:  Fair  Insight:  Fair  Psychomotor Activity:  Decreased  Concentration:  Concentration: Fair  Recall:  [employer] of Knowledge:  Fair  Language:  Fair  Akathisia:  Negative  Handed:  Right  AIMS (if indicated):     Assets:  Leisure Time Resilience  ADL's:  Intact  Cognition:  WNL  Sleep:  Number of Hours: 6.5     Treatment Plan Summary: Daily contact with patient to assess and evaluate symptoms and progress in treatment, Medication management and Plan For schizoaffective type symptoms escalate the Depakote and check level just bumped it by to 50 mg a day and monitor, continue Risperdal continue cognitive and reality based therapy and current precautions  Rozella Servello, MD 08/28/2018, 9:28 AM

## 2018-08-28 NOTE — Progress Notes (Signed)
D:  [name] has been in the bed the entire shift.  RN gave her medications in her room.  She denied any SI/HI or AVH.  She denied any pain or discomfort and appeared to be in no physical distress.  She declined any issues at this time and fell back asleep immediately after taking hs medication.  Trazodone was not given since she was sedated from earlier medications.   A:  1:1 with RN for support and encouragement.  Medications as ordered.  Q 15 minute checks maintained for safety.  Encouraged participation in group and unit activities.   R:  [name] remains safe on the unit.  We will continue to monitor the progress towards her goals.

## 2018-08-28 NOTE — Plan of Care (Signed)
  Problem: Coping: Goal: Ability to verbalize frustrations and anger appropriately will improve Outcome: Progressing Goal: Ability to demonstrate self-control will improve Outcome: Progressing   D: Pt alert and oriented on the unit. Pt engaging with RN staff and other pts. Pt denies SI/HI, A/VH. Pt isolated to her room and slept most of the day. Pt stated, "I got upset yesterday with another pt but now I'm calm." Pt is pleasant and cooperative on the unit. A: Education, support and encouragement provided, q15 minute safety checks remain in effect. Medications administered per MD orders. R: No reactions/side effects to medicine noted. Pt denies any concerns at this time, and verbally contracts for safety. Pt ambulating on the unit with no issues. Pt remains safe on and off the unit.
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
## 2018-08-29 NOTE — Progress Notes (Signed)
Pt presents with a flat affect and depressed mood. Pt noted to have minimal interaction on the unit but voiced interest in attending groups today. Pt denies SI/HI. Pt denies AVH. Pt reported sleeping well last night. No concerns verbalized by pt today. Pt verbalized readiness to discharge home tomorrow.   Medications reviewed with pt. Medications administered as ordered per MD. Verbal support provided. Pt encouraged to attend groups. 15 minute checks performed for safety.  Pt compliant with tx plan.

## 2018-08-29 NOTE — Progress Notes (Signed)
[facility] MD Progress Note  08/29/2018 10:21 AM [name]  MRN:  [id number] Subjective:   Patient is generally staying in bed she is less agitated and has had no further outbursts or altercations or issues with other patients she states she is still not ready to go home her baseline is proving to be somewhat elusive but again were not sure what her baseline status is.  She denies wanting to harm self or others just states she is not safe to go denies current auditory and visual hallucinations.  No EPS or TD noted Principal Problem: Schizoaffective disorder, bipolar type ([place]) Diagnosis: Principal Problem:   Schizoaffective disorder, bipolar type ([place])  Total Time spent with patient: 20 minutes  Past Medical History:  Past Medical History:  Diagnosis Date  . Anemia   . Anxiety   . Asthma   . Bipolar 1 disorder ([place])   . Breast cancer ([place])   . Depression   . Diabetes mellitus without complication ([place])   . Hypertension   . Insomnia, persistent   . Schizophrenic disorder ([place])   . Seizures ([place])     Past Surgical History:  Procedure Laterality Date  . BREAST SURGERY Left   . TONSILLECTOMY     Family History:  Family History  Problem Relation Age of Onset  . Depression Mother   . Gout Mother   . Cancer Father        prostate  . Other Father        lung issue  . Alcoholism Other   . Heart attack Paternal Grandfather   . Heart attack Paternal Grandmother   . Heart attack Maternal Grandmother   . Heart attack Maternal Grandfather   . Depression Son   . Anxiety disorder Son     Social History:  Social History   Substance and Sexual Activity  Alcohol Use Yes   Comment: BAC not available at time of assessment     Social History   Substance and Sexual Activity  Drug Use No    Social History   Socioeconomic History  . Marital status: Single    Spouse name: Not on file  . Number of children: Not on file  . Years of education: Not on file  . Highest education  level: Not on file  Occupational History  . Not on file  Social Needs  . Financial resource strain: Not on file  . Food insecurity:    Worry: Not on file    Inability: Not on file  . Transportation needs:    Medical: Not on file    Non-medical: Not on file  Tobacco Use  . Smoking status: Former Smoker    Types: Cigarettes  . Smokeless tobacco: Never Used  Substance and Sexual Activity  . Alcohol use: Yes    Comment: BAC not available at time of assessment  . Drug use: No  . Sexual activity: Never    Birth control/protection: Pill  Lifestyle  . Physical activity:    Days per week: Not on file    Minutes per session: Not on file  . Stress: Not on file  Relationships  . Social connections:    Talks on phone: Not on file    Gets together: Not on file    Attends religious service: Not on file    Active member of club or organization: Not on file    Attends meetings of clubs or organizations: Not on file    Relationship status: Not on file  Other Topics Concern  . Not on file  Social History Narrative  . Not on file   Additional Social History:                         Sleep: Fair  Appetite:  Fair  Current Medications: Current Facility-Administered Medications  Medication Dose Route Frequency Provider Last Rate Last Dose  . acetaminophen (TYLENOL) tablet 650 mg  650 mg Oral Q6H PRN Lindon Romp A, NP      . albuterol (PROVENTIL HFA;VENTOLIN HFA) 108 (90 Base) MCG/ACT inhaler 2 puff  2 puff Inhalation Q6H PRN Nwoko, Agnes I, NP      . alum & mag hydroxide-simeth (MAALOX/MYLANTA) 200-200-20 MG/5ML suspension 30 mL  30 mL Oral Q4H PRN Lindon Romp A, NP      . bacitracin ointment   Topical BID Lindon Romp A, NP      . divalproex (DEPAKOTE) DR tablet 500 mg  500 mg Oral Daily [name], MD   500 mg at 08/29/18 0901  . divalproex (DEPAKOTE) DR tablet 750 mg  750 mg Oral QHS [name], MD   750 mg at 08/28/18 2116  . hydrOXYzine (ATARAX/VISTARIL) tablet 50  mg  50 mg Oral TID PRN Laverle Hobby, PA-C      . OLANZapine zydis (ZYPREXA) disintegrating tablet 10 mg  10 mg Oral Q8H PRN Laverle Hobby, PA-C       And  . LORazepam (ATIVAN) tablet 1 mg  1 mg Oral PRN Patriciaann Clan E, PA-C       And  . ziprasidone (GEODON) injection 20 mg  20 mg Intramuscular PRN Patriciaann Clan E, PA-C      . magnesium hydroxide (MILK OF MAGNESIA) suspension 30 mL  30 mL Oral Daily PRN Lindon Romp A, NP      . mometasone-formoterol (DULERA) 200-5 MCG/ACT inhaler 2 puff  2 puff Inhalation BID Lindell Spar I, NP   2 puff at 08/29/18 0901  . montelukast (SINGULAIR) tablet 10 mg  10 mg Oral QHS Lindell Spar I, NP   10 mg at 08/28/18 2116  . risperiDONE (RISPERDAL) tablet 3 mg  3 mg Oral BID [name], MD   3 mg at 08/29/18 0902  . traZODone (DESYREL) tablet 100 mg  100 mg Oral QHS,MR X 1 Laverle Hobby, PA-C   100 mg at 08/28/18 2116    Lab Results: No results found for this or any previous visit (from the past 48 hour(s)).  Blood Alcohol level:  Lab Results  Component Value Date   ETH <10 08/26/2018   ETH <10 54/56/2563    Metabolic Disorder Labs: Lab Results  Component Value Date   HGBA1C 5.0 08/27/2018   MPG 96.8 08/27/2018   MPG 96.8 11/21/2017   Lab Results  Component Value Date   PROLACTIN 7.5 08/27/2018   PROLACTIN 5.4 11/21/2017   Lab Results  Component Value Date   CHOL 179 08/27/2018   TRIG 54 08/27/2018   HDL 57 08/27/2018   CHOLHDL 3.1 08/27/2018   VLDL 11 08/27/2018   LDLCALC 111 (H) 08/27/2018   LDLCALC 115 (H) 11/21/2017    Physical Findings: AIMS: Facial and Oral Movements Muscles of Facial Expression: None, normal Lips and Perioral Area: None, normal Jaw: None, normal Tongue: None, normal,Extremity Movements Upper (arms, wrists, hands, fingers): None, normal Lower (legs, knees, ankles, toes): None, normal, Trunk Movements Neck, shoulders, hips: None, normal, Overall Severity Severity of abnormal movements (  highest  score from questions above): None, normal Incapacitation due to abnormal movements: None, normal [name] awareness of abnormal movements (rate only patient's report): No Awareness, Dental Status Current problems with teeth and/or dentures?: No Does patient usually wear dentures?: No  CIWA:  CIWA-Ar Total: 8 COWS:  COWS Total Score: 4  Musculoskeletal: Strength & Muscle Tone: within normal limits Gait & Station: normal Patient leans: N/A  Psychiatric Specialty Exam: Physical Exam  ROS  Blood pressure 113/84, pulse 96, temperature 98.2 F (36.8 C), temperature source Oral, resp. rate 18, height 5\' 6"  (1.676 m), weight 104.3 kg, last menstrual period 08/12/2018, SpO2 100 %.Body mass index is 37.12 kg/m.  General Appearance: Casual and Disheveled  Eye Contact:  Poor  Speech:  Slow  Volume:  Decreased  Mood:  Dysphoric  Affect:  Restricted  Thought Process:  Linear  Orientation:  Full (Time, Place, and Person)  Thought Content:  Tangential  Suicidal Thoughts:  No  Homicidal Thoughts:  No  Memory:  Immediate;   Fair  Judgement:  Fair  Insight:  Fair  Psychomotor Activity:  Decreased  Concentration:  Attention Span: Fair  Recall:  [employer] of Knowledge:  Fair  Language:  Fair  Akathisia:  Negative  Handed:  Right  AIMS (if indicated):     Assets:  Resilience Social Support  ADL's:  Intact  Cognition:  WNL  Sleep:  Number of Hours: 6.5     Treatment Plan Summary: Daily contact with patient to assess and evaluate symptoms and progress in treatment, Medication management and Plan For patient's dysphoria continue current measures for psychosis continue Risperdal therapy for insight continue reality based therapy may go in the morning  Yvette Brass, MD 08/29/2018, 10:21 AM

## 2018-08-29 NOTE — BHH Group Notes (Signed)
[facility] Group Therapy 08/29/2018 2:06 PM  Type of Therapy: Mental Health Association Presentation  Participation Level: Did not attend.   Stephanie Acre, MSW, [facility] 08/29/2018 2:04 PM

## 2018-08-29 NOTE — Progress Notes (Signed)
Adult Psychoeducational Group Note  Date:  08/29/2018 Time:  9:22 PM  Group Topic/Focus:  Wrap-Up Group:   The focus of this group is to help patients review their daily goal of treatment and discuss progress on daily workbooks.  Participation Level:  Active  Participation Quality:  Appropriate  Affect:  Appropriate  Cognitive:  Appropriate  Insight: Appropriate  Engagement in Group:  Engaged  Modes of Intervention:  Discussion  Additional Comments: The patient expressed that she rates today a 7.  Nash Shearer 08/29/2018, 9:22 PM

## 2018-08-29 NOTE — Progress Notes (Signed)
Patient ID: [name], female   DOB: Dec 11, 1970, 48 y.o.   MRN: [id number] D: Patient in bed sleeping on approach. Pt woke up reports feeling rested. Pt mood and affect appears anxious. Pt reports she is to be discharged tomorrow but not sure where she will be going. Pt has been compliant with prescribed medication and played cards with peers before going back to bed. Denies  SI/HI/AVH and pain.No behavioral issues noted.  A: Support and encouragement offered as needed to express needs. Medications administered as prescribed.  R: Patient is safe and cooperative on unit. Will continue to monitor  for safety and stability.

## 2018-08-30 MED ORDER — MOMETASONE FURO-FORMOTEROL FUM 200-5 MCG/ACT IN AERO: 2.0000 | INHALATION_SPRAY | Freq: Two times a day (BID) | RESPIRATORY_TRACT | Status: DC

## 2018-08-30 MED ORDER — ALBUTEROL SULFATE HFA 108 (90 BASE) MCG/ACT IN AERS: 2.0000 | INHALATION_SPRAY | Freq: Four times a day (QID) | RESPIRATORY_TRACT | 0 refills | Status: DC | PRN

## 2018-08-30 MED ORDER — DIVALPROEX SODIUM 250 MG PO DR TAB: 750.0000 mg | DELAYED_RELEASE_TABLET | Freq: Every day | ORAL | 0 refills | Status: DC

## 2018-08-30 MED ORDER — RISPERIDONE 3 MG PO TABS: 6.0000 mg | ORAL_TABLET | Freq: Every day | ORAL | 0 refills | Status: DC

## 2018-08-30 MED ORDER — HYDROXYZINE HCL 50 MG PO TABS: 50.0000 mg | ORAL_TABLET | Freq: Three times a day (TID) | ORAL | 0 refills | Status: DC | PRN

## 2018-08-30 MED ORDER — RISPERIDONE 3 MG PO TABS
6.0000 mg | ORAL_TABLET | Freq: Every day | ORAL | Status: DC
  Filled 2018-08-30: qty 2

## 2018-08-30 MED ORDER — DIVALPROEX SODIUM 500 MG PO DR TAB: 500.0000 mg | DELAYED_RELEASE_TABLET | Freq: Every day | ORAL | 0 refills | Status: DC

## 2018-08-30 MED ORDER — TRAZODONE HCL 100 MG PO TABS: 100.0000 mg | ORAL_TABLET | Freq: Every evening | ORAL | 0 refills | Status: DC | PRN

## 2018-08-30 MED ORDER — BUDESONIDE-FORMOTEROL FUMARATE 160-4.5 MCG/ACT IN AERO: 2.0000 | INHALATION_SPRAY | Freq: Two times a day (BID) | RESPIRATORY_TRACT | 0 refills | Status: DC

## 2018-08-30 MED ORDER — MONTELUKAST SODIUM 10 MG PO TABS: 10.0000 mg | ORAL_TABLET | Freq: Every day | ORAL | 0 refills | Status: DC

## 2018-08-30 NOTE — Progress Notes (Signed)
  [facility] Adult Case Management Discharge Plan :  Will you be returning to the same living situation after discharge:  Yes,  home At discharge, do you have transportation home?: Yes,  bus pass Do you have the ability to pay for your medications: Yes,  [employer] Medicare  Release of information consent forms completed and in the chart; work letter and bus pass on chart.  Patient to Follow up at: Follow-up Information    [facility] Go on 09/03/2018.   Specialty:  Family Medicine Why:  Your hospital follow up appointment is Tuesday, 09/03/18 at 10:15a.  Contact information: [address] [address] [phone]           Next level of care provider has access to [place] and Suicide Prevention discussed: Yes,  with father  Have you used any form of tobacco in the last 30 days? (Cigarettes, Smokeless Tobacco, Cigars, and/or Pipes): Patient Refused Screening  Has patient been referred to the Quitline?: Patient refused referral  Patient has been referred for addiction treatment: Yes  Joellen Jersey, [place] 08/30/2018, 9:20 AM

## 2018-08-30 NOTE — Plan of Care (Signed)
  Problem: Education: Goal: Emotional status will improve Outcome: Progressing Goal: Mental status will improve Outcome: Progressing   

## 2018-08-30 NOTE — Progress Notes (Signed)
Pt is prepared for discharge as she completes her daily assessment today. On this, she wrote she denied having suicidal ideation and she rated her depression, hopelessness and anxiety " 10/0/91/2 ", respectively. She is given her dc instructions and they are reviewed with her and she is given cc of instructions ( SRA, AVS, SSP and transition record) as well as prescriptions for continuing care. ALl belongings are returned to her and she was escorted to bldg entrance and dc'd ambulatory.

## 2018-08-30 NOTE — BHH Suicide Risk Assessment (Signed)
[facility] Discharge Suicide Risk Assessment   Principal Problem: Schizoaffective disorder, bipolar type [facility]) Discharge Diagnoses: Principal Problem:   Schizoaffective disorder, bipolar type ([place])   Total Time spent with patient: 30 minutes  Current mental status exam-patient thought to be at her baseline she is alert oriented fully and cooperative denying manic symptoms and no manic symptoms are observed or discerned.  No thoughts of harming self or others no EPS or TD mood is stable  Mental Status Per Nursing Assessment::   On Admission:  Suicidal ideation indicated by patient, Self-harm thoughts, Intention to act on suicide plan  Demographic Factors:  Unemployed  Loss Factors: NA  Historical Factors: NA  Risk Reduction Factors:   Religious beliefs about death  Continued Clinical Symptoms:  Bipolar Disorder:   Mixed State  Cognitive Features That Contribute To Risk:  None    Suicide Risk:  Minimal: No identifiable suicidal ideation.  Patients presenting with no risk factors but with morbid ruminations; may be classified as minimal risk based on the severity of the depressive symptoms  Follow-up Information    [facility] Go on 09/03/2018.   Specialty:  Family Medicine Why:  Your hospital follow up appointment is Tuesday, 09/03/18 at 10:15a.  Contact information: [address] [place] [address] [phone]           Plan Of Care/Follow-up recommendations:  Activity:  full  Kalenna Millett, MD 08/30/2018, 8:08 AM
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
## 2018-10-03 ENCOUNTER — Ambulatory Visit (HOSPITAL_COMMUNITY)
Admission: RE | Admit: 2018-10-03 | Discharge: 2018-10-03 | Disposition: A | Discharge status: Home / Self Care | Payer: Medicare HMO | Attending: Psychiatry | Admitting: Psychiatry

## 2018-10-03 DIAGNOSIS — G4700 Insomnia, unspecified: Secondary | ICD-10-CM | POA: Diagnosis not present

## 2018-10-03 DIAGNOSIS — F319 Bipolar disorder, unspecified: Secondary | ICD-10-CM | POA: Diagnosis not present

## 2018-10-03 DIAGNOSIS — F209 Schizophrenia, unspecified: Secondary | ICD-10-CM | POA: Insufficient documentation

## 2018-10-03 DIAGNOSIS — F419 Anxiety disorder, unspecified: Secondary | ICD-10-CM | POA: Diagnosis not present

## 2018-10-03 NOTE — BH Assessment (Addendum)
Assessment Note  [name] is an 48 y.o. female who presents to [facility] as a walk-in. Pt is displaying symptoms of a manic episode during the assessment. Pt is crying hysterically, speaking incoherently and disorganized. Pt is laughing at inappropriate times throughout the assessment and often responds with irrelevant statements. Pt states she has had thoughts of hurting herself but does not give details about a plan or intent. Pt states she feels worried about a little girl. Pt goes from crying hysterically to laughing multiple times during the assessment. Pt states she does not know why she feels this way and wants help. Pt stated "I pray he don't get that coronavirus. I worry about him all the time. I think they did something to that little girl. I don't have on a bra." Pt continues to ramble and speak in rants without any clear thought process during the assessment. Pt has multiple ED visits and inpt admissions c/o Schizoaffective disorder.  Clinician asked the pt if she has a support system that we could contact in order to obtain collateral information and the pt states she has a brother but does not know how to contact him. TTS located a contact for the pt's brother in the chart [facility] - [phone]) but line does not ring when called.   Per Patriciaann Clan, PA pt meets criteria for inpt treatment. [facility] at capacity for 500 hall beds. [facility]. [facility] by [facility] and advised of pt transfer for medical clearance and to await placement. TTS to seek placement for inpt treatment.   Diagnosis: Schizoaffective disorder, bipolar type [facility])   Past Medical History:  Past Medical History:  Diagnosis Date  . Anemia   . Anxiety   . Asthma   . Bipolar 1 disorder ([name])   . Breast cancer ([place])   . Depression   . Diabetes mellitus without complication ([place])   . Hypertension   . Insomnia, persistent   . Schizophrenic disorder ([place])   . Seizures ([place])     Past Surgical  History:  Procedure Laterality Date  . BREAST SURGERY Left   . TONSILLECTOMY      Family History:  Family History  Problem Relation Age of Onset  . Depression Mother   . Gout Mother   . Cancer Father        prostate  . Other Father        lung issue  . Alcoholism Other   . Heart attack Paternal Grandfather   . Heart attack Paternal Grandmother   . Heart attack Maternal Grandmother   . Heart attack Maternal Grandfather   . Depression Son   . Anxiety disorder Son     Social History:  reports that she has quit smoking. Her smoking use included cigarettes. She has never used smokeless tobacco. She reports current alcohol use. She reports that she does not use drugs.  Additional Social History:  Alcohol / Drug Use Pain Medications: See MAR Prescriptions: See MAR Over the Counter: See MAR History of alcohol / drug use?: No history of alcohol / drug abuse  CIWA:   COWS:    Allergies:  Allergies  Allergen Reactions  . Haldol [Haloperidol Decanoate] Other (See Comments)    Stiffness, eyes bulging  . Haloperidol   . Penicillins Nausea And Vomiting    Has patient had a PCN reaction causing immediate rash, facial/tongue/throat swelling, SOB or lightheadedness with hypotension:UNSURE  Has patient had a PCN reaction causing  severe rash involving mucus membranes or skin necrosis: UNSURE Has patient had a PCN reaction that required hospitalization:UNSURE Has patient had a PCN reaction occurring within the last 10 years:No If all of the above answers are "NO", then may proceed with Cephalosporin use. CHILDHOOD REACTION  . Pollen Extract Other (See Comments)    Seasonal allergies  . Shrimp [Shellfish Allergy] Rash    Home Medications: (Not in a hospital admission)   OB/GYN Status:  No LMP recorded.  General Assessment Data Location of Assessment: [facility] Assessment Services TTS Assessment: In system Is this a Tele or Face-to-Face Assessment?: Face-to-Face Is this an Initial  Assessment or a Re-assessment for this encounter?: Initial Assessment Patient Accompanied by:: N/A Language Other than English: No Living Arrangements: Other (Comment) What gender do you identify as?: Female Marital status: Separated Pregnancy Status: No Living Arrangements: Alone Can pt return to current living arrangement?: Yes Admission Status: Voluntary Is patient capable of signing voluntary admission?: Yes Referral Source: Self/Family/Friend Insurance type: [facility]  Medical Screening Exam ([place]) Medical Exam completed: Yes  Crisis Care Plan Living Arrangements: Alone Name of Psychiatrist: [facility] Name of Therapist: [facility]  Education Status Is patient currently in school?: No Is the patient employed, unemployed or receiving disability?: (unknown)  Risk to self with the past 6 months Suicidal Ideation: Yes-Currently Present Has patient been a risk to self within the past 6 months prior to admission? : No Suicidal Intent: No Has patient had any suicidal intent within the past 6 months prior to admission? : No Is patient at risk for suicide?: Yes Suicidal Plan?: No Has patient had any suicidal plan within the past 6 months prior to admission? : No Access to Means: No What has been your use of drugs/alcohol within the last 12 months?: denies Previous Attempts/Gestures: No Triggers for Past Attempts: None known Intentional Self Injurious Behavior: None Family Suicide History: No Recent stressful life event(s): Turmoil (Comment)(manic episode) Persecutory voices/beliefs?: Yes Depression: Yes Depression Symptoms: Tearfulness, Feeling worthless/self pity Substance abuse history and/or treatment for substance abuse?: No Suicide prevention information given to non-admitted patients: Not applicable  Risk to Others within the past 6 months Homicidal Ideation: No Does patient have any lifetime risk of violence toward others beyond the six months prior to  admission? : No Thoughts of Harm to Others: No Current Homicidal Intent: No Current Homicidal Plan: No Access to Homicidal Means: No History of harm to others?: No Assessment of Violence: None Noted Does patient have access to weapons?: No Criminal Charges Pending?: No Does patient have a court date: No Is patient on probation?: No  Psychosis Hallucinations: Auditory, Visual Delusions: Unspecified  Mental Status Report Appearance/Hygiene: Disheveled Eye Contact: Good Motor Activity: Freedom of movement Speech: Pressured, Rapid, Incoherent Level of Consciousness: Crying Mood: Depressed, Anxious, Despair, Helpless, Sad, Preoccupied Affect: Anxious, Depressed Anxiety Level: Severe Thought Processes: Flight of Ideas, Irrelevant Judgement: Impaired Orientation: Person Obsessive Compulsive Thoughts/Behaviors: None  Cognitive Functioning Concentration: Decreased Memory: Recent Impaired, Remote Impaired Is patient IDD: No Insight: Poor Impulse Control: Poor Appetite: Good Have you had any weight changes? : No Change Sleep: Decreased Total Hours of Sleep: 4 Vegetative Symptoms: None  ADLScreening [facility] Assessment Services) [name] cognitive ability adequate to safely complete daily activities?: Yes Patient able to express need for assistance with ADLs?: Yes Independently performs ADLs?: Yes (appropriate for developmental age)  Prior Inpatient Therapy Prior Inpatient Therapy: Yes Prior Therapy Dates: 2019, 2018, and many others Prior Therapy Facilty/Provider(s): [place], [facility], [facility] Reason  for Treatment: Schizoaffective disorder, bipolar type [facility])   Prior Outpatient Therapy Prior Outpatient Therapy: Yes Prior Therapy Dates: ongoing Prior Therapy Facilty/Provider(s): [facility] Reason for Treatment: med management Does patient have an ACCT team?: No Does patient have Intensive In-House Services?  : No Does patient have [facility] services? : Yes Does patient have P4CC  services?: No  ADL Screening (condition at time of admission) [name] cognitive ability adequate to safely complete daily activities?: Yes Is the patient deaf or have difficulty hearing?: No Does the patient have difficulty seeing, even when wearing glasses/contacts?: No Does the patient have difficulty concentrating, remembering, or making decisions?: Yes Patient able to express need for assistance with ADLs?: Yes Does the patient have difficulty dressing or bathing?: No Independently performs ADLs?: Yes (appropriate for developmental age) Does the patient have difficulty walking or climbing stairs?: No Weakness of Legs: None Weakness of Arms/Hands: None  Home Assistive Devices/Equipment Home Assistive Devices/Equipment: None    Abuse/Neglect Assessment (Assessment to be complete while patient is alone) Abuse/Neglect Assessment Can Be Completed: Unable to assess, patient is non-responsive or altered mental status(pt crying hysterically during the assessment, responses are erratic and incomprehensible )     Advance Directives (For Healthcare) Does Patient Have a Medical Advance Directive?: No Would patient like information on creating a medical advance directive?: No - Patient declined          Disposition: Per Patriciaann Clan, PA pt meets criteria for inpt treatment. [facility] at capacity for 500 hall beds. Pt to be sent to [facility]. [facility] and advised of pt transfer for medical clearance and to await placement. TTS to seek placement for inpt treatment.   Disposition Initial Assessment Completed for this Encounter: Yes Disposition of Patient: Admit Type of inpatient treatment program: Adult Patient refused recommended treatment: No Mode of transportation if patient is discharged/movement?: [place] [facility] to await inpt placement)  On Site Evaluation by:   Reviewed with Physician:    Lyanne Co 10/04/2018 12:46 AM

## 2018-10-03 NOTE — H&P (Signed)
Behavioral Health Medical Screening Exam  [name] is an 48 y.o. female.with prior hx of mental illness. She is known in this [facility] from her previous psychiatric hospitalizations for mood stabilization treatments. She has a hx of schizoaffective d/o, bipolar type and was discharged last from [facility] in Dec of 2019 with an outpatient follow-up care referral & appointment.  Total Time spent with patient: 20 minutes  Psychiatric Specialty Exam: Physical Exam  Constitutional: She is oriented to person, place, and time. She appears well-developed and well-nourished. No distress.  HENT:  Head: Normocephalic.  Eyes: Pupils are equal, round, and reactive to light.  Respiratory: Effort normal and breath sounds normal. No respiratory distress.  Neurological: She is alert and oriented to person, place, and time. No cranial nerve deficit.  Skin: Skin is warm and dry. She is not diaphoretic.  Psychiatric: Her affect is inappropriate. Her speech is tangential. She is withdrawn. Cognition and memory are impaired. She expresses inappropriate judgment. She expresses no homicidal and no suicidal ideation. She expresses no suicidal plans and no homicidal plans. She is inattentive.    Review of Systems  Constitutional: Negative.  Negative for chills, diaphoresis, fever, malaise/fatigue and weight loss.  Psychiatric/Behavioral: Positive for hallucinations and memory loss. The patient is nervous/anxious and has insomnia.     There were no vitals taken for this visit.There is no height or weight on file to calculate BMI.  General Appearance: Casual  Eye Contact:  Fair  Speech:  Garbled  Volume:  Normal  Mood:  Depressed  Affect:  Full Range  Thought Process:  Disorganized  Orientation:  Full (Time, Place, and Person)  Thought Content:  Tangential  Suicidal Thoughts:  No  Homicidal Thoughts:  No  Memory:  Immediate;   Poor  Judgement:  Poor  Insight:  Lacking  Psychomotor Activity:  Normal  Concentration:  Concentration: Poor  Recall:  Poor  Fund of Knowledge:Poor  Language: Fair  Akathisia:  No  Handed:  Right  AIMS (if indicated):     Assets:  Desire for Improvement  Sleep:       Musculoskeletal: Strength & Muscle Tone: within normal limits Gait & Station: normal Patient leans: N/A  There were no vitals taken for this visit.  Recommendations:  Based on my evaluation the patient does not appear to have an emergency medical condition.  Laverle Hobby, PA-C 10/03/2018, 11:49 PM

## 2018-10-04 ENCOUNTER — Encounter (HOSPITAL_COMMUNITY): Payer: Self-pay

## 2018-10-04 ENCOUNTER — Other Ambulatory Visit: Payer: Self-pay

## 2018-10-04 ENCOUNTER — Inpatient Hospital Stay (HOSPITAL_COMMUNITY)
Admission: AD | Admit: 2018-10-04 | Discharge: 2018-10-08 | DRG: 885 | Disposition: A | Discharge status: Home / Self Care | Payer: Medicare HMO | Source: Intra-hospital | Attending: Psychiatry | Admitting: Psychiatry

## 2018-10-04 ENCOUNTER — Emergency Department (HOSPITAL_COMMUNITY)
Admission: EM | Admit: 2018-10-04 | Discharge: 2018-10-04 | Disposition: A | Discharge status: Other Acute Inpatient Hospital | Payer: Medicare HMO | Attending: Emergency Medicine | Admitting: Emergency Medicine

## 2018-10-04 DIAGNOSIS — E119 Type 2 diabetes mellitus without complications: Secondary | ICD-10-CM | POA: Insufficient documentation

## 2018-10-04 DIAGNOSIS — F209 Schizophrenia, unspecified: Secondary | ICD-10-CM | POA: Diagnosis present

## 2018-10-04 DIAGNOSIS — F419 Anxiety disorder, unspecified: Secondary | ICD-10-CM | POA: Diagnosis not present

## 2018-10-04 DIAGNOSIS — N760 Acute vaginitis: Secondary | ICD-10-CM | POA: Diagnosis not present

## 2018-10-04 DIAGNOSIS — N898 Other specified noninflammatory disorders of vagina: Secondary | ICD-10-CM | POA: Diagnosis present

## 2018-10-04 DIAGNOSIS — J45909 Unspecified asthma, uncomplicated: Secondary | ICD-10-CM | POA: Diagnosis present

## 2018-10-04 DIAGNOSIS — Z79899 Other long term (current) drug therapy: Secondary | ICD-10-CM | POA: Insufficient documentation

## 2018-10-04 DIAGNOSIS — B9689 Other specified bacterial agents as the cause of diseases classified elsewhere: Secondary | ICD-10-CM

## 2018-10-04 DIAGNOSIS — Z853 Personal history of malignant neoplasm of breast: Secondary | ICD-10-CM | POA: Diagnosis not present

## 2018-10-04 DIAGNOSIS — Z87891 Personal history of nicotine dependence: Secondary | ICD-10-CM | POA: Insufficient documentation

## 2018-10-04 DIAGNOSIS — F3010 Manic episode without psychotic symptoms, unspecified: Secondary | ICD-10-CM

## 2018-10-04 DIAGNOSIS — F309 Manic episode, unspecified: Secondary | ICD-10-CM | POA: Diagnosis not present

## 2018-10-04 DIAGNOSIS — Z818 Family history of other mental and behavioral disorders: Secondary | ICD-10-CM | POA: Diagnosis not present

## 2018-10-04 DIAGNOSIS — F201 Disorganized schizophrenia: Secondary | ICD-10-CM | POA: Diagnosis not present

## 2018-10-04 DIAGNOSIS — F200 Paranoid schizophrenia: Secondary | ICD-10-CM | POA: Diagnosis not present

## 2018-10-04 LAB — COMPREHENSIVE METABOLIC PANEL
ALT: 21 U/L (ref 0–44)
AST: 15 U/L (ref 15–41)
Albumin: 4.1 g/dL (ref 3.5–5.0)
Alkaline Phosphatase: 45 U/L (ref 38–126)
Anion gap: 7 (ref 5–15)
BUN: 9 mg/dL (ref 6–20)
CO2: 25 mmol/L (ref 22–32)
Calcium: 8.9 mg/dL (ref 8.9–10.3)
Chloride: 109 mmol/L (ref 98–111)
Creatinine, Ser: 0.67 mg/dL (ref 0.44–1.00)
GFR calc non Af Amer: >60 mL/min (ref >60)
Glucose, Bld: 105 mg/dL — ABNORMAL HIGH (ref 70–99)
POTASSIUM: 3.5 mmol/L (ref 3.5–5.1)
Sodium: 141 mmol/L (ref 135–145)
Total Bilirubin: 0.4 mg/dL (ref 0.3–1.2)
Total Protein: 7.4 g/dL (ref 6.5–8.1)

## 2018-10-04 LAB — RAPID URINE DRUG SCREEN, HOSP PERFORMED
Amphetamines: NOT DETECTED
Barbiturates: NOT DETECTED
Benzodiazepines: NOT DETECTED
Cocaine: NOT DETECTED
Opiates: NOT DETECTED
Tetrahydrocannabinol: NOT DETECTED

## 2018-10-04 LAB — CBC
HCT: 36.8 % (ref 36.0–46.0)
Hemoglobin: 11.6 g/dL — ABNORMAL LOW (ref 12.0–15.0)
MCH: 29.6 pg (ref 26.0–34.0)
MCHC: 31.5 g/dL (ref 30.0–36.0)
MCV: 93.9 fL (ref 80.0–100.0)
Platelets: 264 10*3/uL (ref 150–400)
RBC: 3.92 MIL/uL (ref 3.87–5.11)
RDW: 12.2 % (ref 11.5–15.5)
WBC: 5.8 10*3/uL (ref 4.0–10.5)
nRBC: 0 % (ref 0.0–0.2)

## 2018-10-04 LAB — WET PREP, GENITAL
Sperm: NONE SEEN
Trich, Wet Prep: NONE SEEN
WBC, Wet Prep HPF POC: NONE SEEN
YEAST WET PREP: NONE SEEN

## 2018-10-04 LAB — SALICYLATE LEVEL: Salicylate Lvl: <7 mg/dL (ref 2.8–30.0)

## 2018-10-04 LAB — I-STAT BETA HCG BLOOD, ED (MC, WL, AP ONLY): I-stat hCG, quantitative: <5 m[IU]/mL (ref <5)

## 2018-10-04 LAB — URINALYSIS, ROUTINE W REFLEX MICROSCOPIC
Bilirubin Urine: NEGATIVE
GLUCOSE, UA: NEGATIVE mg/dL
Hgb urine dipstick: NEGATIVE
Ketones, ur: 5 mg/dL — AB
Leukocytes, UA: NEGATIVE
Nitrite: NEGATIVE
PROTEIN: NEGATIVE mg/dL
Specific Gravity, Urine: 1.028 (ref 1.005–1.030)
pH: 5 (ref 5.0–8.0)

## 2018-10-04 LAB — ACETAMINOPHEN LEVEL: Acetaminophen (Tylenol), Serum: <10 ug/mL — ABNORMAL LOW (ref 10–30)

## 2018-10-04 LAB — ETHANOL: Alcohol, Ethyl (B): <10 mg/dL (ref <10)

## 2018-10-04 MED ORDER — ACETAMINOPHEN 325 MG PO TABS: 650.0000 mg | ORAL_TABLET | Freq: Four times a day (QID) | ORAL | Status: DC | PRN

## 2018-10-04 MED ORDER — ALBUTEROL SULFATE HFA 108 (90 BASE) MCG/ACT IN AERS
2.0000 | INHALATION_SPRAY | Freq: Four times a day (QID) | RESPIRATORY_TRACT | Status: DC | PRN
  Administered 2018-10-06 – 2018-10-07 (×2): 2 via RESPIRATORY_TRACT
  Filled 2018-10-04: qty 6.7

## 2018-10-04 MED ORDER — BENZTROPINE MESYLATE 0.5 MG PO TABS
0.5000 mg | ORAL_TABLET | Freq: Two times a day (BID) | ORAL | Status: DC
  Administered 2018-10-04 – 2018-10-08 (×9): 0.5 mg via ORAL
  Filled 2018-10-04 (×14): qty 1

## 2018-10-04 MED ORDER — METRONIDAZOLE 500 MG PO TABS: 500.0000 mg | ORAL_TABLET | Freq: Two times a day (BID) | ORAL | 0 refills | Status: DC

## 2018-10-04 MED ORDER — MAGNESIUM HYDROXIDE 400 MG/5ML PO SUSP: 30.0000 mL | Freq: Every day | ORAL | Status: DC | PRN

## 2018-10-04 MED ORDER — HYDROXYZINE HCL 25 MG PO TABS
25.0000 mg | ORAL_TABLET | Freq: Four times a day (QID) | ORAL | Status: DC | PRN
  Administered 2018-10-06 (×2): 25 mg via ORAL
  Filled 2018-10-04 (×3): qty 1

## 2018-10-04 MED ORDER — METRONIDAZOLE 500 MG PO TABS
500.0000 mg | ORAL_TABLET | Freq: Once | ORAL | Status: AC
  Administered 2018-10-04: 500 mg via ORAL
  Filled 2018-10-04: qty 1

## 2018-10-04 MED ORDER — RISPERIDONE 3 MG PO TABS
3.0000 mg | ORAL_TABLET | Freq: Two times a day (BID) | ORAL | Status: DC
  Administered 2018-10-04 – 2018-10-08 (×9): 3 mg via ORAL
  Filled 2018-10-04 (×13): qty 1
  Filled 2018-10-04: qty 3

## 2018-10-04 MED ORDER — ALUM & MAG HYDROXIDE-SIMETH 200-200-20 MG/5ML PO SUSP: 30.0000 mL | ORAL | Status: DC | PRN

## 2018-10-04 MED ORDER — DIVALPROEX SODIUM ER 500 MG PO TB24
500.0000 mg | ORAL_TABLET | Freq: Two times a day (BID) | ORAL | Status: AC
  Administered 2018-10-04 – 2018-10-06 (×6): 500 mg via ORAL
  Filled 2018-10-04 (×9): qty 1

## 2018-10-04 MED ORDER — OLANZAPINE 10 MG PO TABS
10.0000 mg | ORAL_TABLET | Freq: Two times a day (BID) | ORAL | Status: DC
  Filled 2018-10-04 (×3): qty 1

## 2018-10-04 MED ORDER — TRAZODONE HCL 100 MG PO TABS
100.0000 mg | ORAL_TABLET | Freq: Every day | ORAL | Status: DC
  Filled 2018-10-04: qty 1

## 2018-10-04 MED ORDER — ARIPIPRAZOLE ER 400 MG IM SRER
400.0000 mg | Freq: Once | INTRAMUSCULAR | Status: AC
  Administered 2018-10-04: 400 mg via INTRAMUSCULAR

## 2018-10-04 MED ORDER — TRAZODONE HCL 50 MG PO TABS
50.0000 mg | ORAL_TABLET | Freq: Every evening | ORAL | Status: DC | PRN
  Administered 2018-10-04 – 2018-10-05 (×3): 50 mg via ORAL
  Filled 2018-10-04 (×8): qty 1

## 2018-10-04 MED ORDER — HYDROXYZINE HCL 25 MG PO TABS: 25.0000 mg | ORAL_TABLET | Freq: Three times a day (TID) | ORAL | Status: DC | PRN

## 2018-10-04 NOTE — Tx Team (Signed)
Initial Treatment Plan 10/04/2018 5:04 AM [name] KOE:[id number]    PATIENT STRESSORS: Medication change or noncompliance Other: Chronic mental illness   PATIENT STRENGTHS: Average or above average intelligence Capable of independent living Communication skills   PATIENT IDENTIFIED PROBLEMS: Psychosis  Suicidal ideation  "Calm"  "Be on an even keel"  "appropriate behavior"             DISCHARGE CRITERIA:  Improved stabilization in mood, thinking, and/or behavior Need for constant or close observation no longer present Reduction of life-threatening or endangering symptoms to within safe limits Verbal commitment to aftercare and medication compliance  PRELIMINARY DISCHARGE PLAN: Outpatient therapy Medication management  PATIENT/FAMILY INVOLVEMENT: This treatment plan has been presented to and reviewed with the patient, [name].  The patient and family have been given the opportunity to ask questions and make suggestions.  [name], RN 10/04/2018, 5:04 AM

## 2018-10-04 NOTE — Plan of Care (Signed)
Progress note  Pt found in bed; compliant with medications. Pt is much more clear than she usually is. Pt still has pressured speech but is not as tangential. Pt denies any physical pain. Pt denies si/hi/ah/vh and verbally agrees to approach staff if these become apparent or before harming herself or others while at [facility]. Pt safe on the unit. Will continue to monitor. Q38m safety checks implemented and continued.   Pt progressing in the following metrics  Problem: Education: Goal: Knowledge of [place] [facility] Education information/materials will improve Outcome: Progressing Goal: Emotional status will improve Outcome: Progressing Goal: Mental status will improve Outcome: Progressing Goal: Verbalization of understanding the information provided will improve Outcome: Progressing

## 2018-10-04 NOTE — Tx Team (Signed)
Interdisciplinary Treatment and Diagnostic Plan Update  10/04/2018 Time of Session: [place] MRN: [id number]  Principal Diagnosis: <principal problem not specified>  Secondary Diagnoses: Active Problems:   Schizophrenia ([place])   Current Medications:  Current Facility-Administered Medications  Medication Dose Route Frequency Provider Last Rate Last Dose  . acetaminophen (TYLENOL) tablet 650 mg  650 mg Oral Q6H PRN Patriciaann Clan E, PA-C      . albuterol (PROVENTIL HFA;VENTOLIN HFA) 108 (90 Base) MCG/ACT inhaler 2 puff  2 puff Inhalation Q6H PRN [name], MD      . alum & mag hydroxide-simeth (MAALOX/MYLANTA) 200-200-20 MG/5ML suspension 30 mL  30 mL Oral Q4H PRN Laverle Hobby, PA-C      . ARIPiprazole ER (ABILIFY MAINTENA) injection 400 mg  400 mg Intramuscular Once [name], MD      . benztropine (COGENTIN) tablet 0.5 mg  0.5 mg Oral BID [name], MD   0.5 mg at 10/04/18 1054  . divalproex (DEPAKOTE ER) 24 hr tablet 500 mg  500 mg Oral BID Patrecia Pour, NP   500 mg at 10/04/18 1054  . hydrOXYzine (ATARAX/VISTARIL) tablet 25 mg  25 mg Oral Q6H PRN Laverle Hobby, PA-C      . hydrOXYzine (ATARAX/VISTARIL) tablet 25 mg  25 mg Oral TID PRN Patrecia Pour, NP      . magnesium hydroxide (MILK OF MAGNESIA) suspension 30 mL  30 mL Oral Daily PRN Laverle Hobby, PA-C      . risperiDONE (RISPERDAL) tablet 3 mg  3 mg Oral BID [name], MD   3 mg at 10/04/18 1054  . traZODone (DESYREL) tablet 50 mg  50 mg Oral QHS,MR X 1 Simon, Spencer E, PA-C       PTA Medications: Medications Prior to Admission  Medication Sig Dispense Refill Last Dose  . ABILIFY MAINTENA 400 MG PRSY prefilled syringe Inject 400 mg into the muscle every 28 (twenty-eight) days.     Marland Kitchen albuterol (PROVENTIL HFA;VENTOLIN HFA) 108 (90 Base) MCG/ACT inhaler Inhale 2 puffs into the lungs every 6 (six) hours as needed for wheezing or shortness of breath. 1 Inhaler 0   . benztropine (COGENTIN) 0.5 MG  tablet Take 1 tablet by mouth 2 (two) times daily.     . budesonide-formoterol (SYMBICORT) 160-4.5 MCG/ACT inhaler Inhale 2 puffs into the lungs 2 (two) times daily. For Shortness of breath 1 Inhaler 0   . divalproex (DEPAKOTE ER) 500 MG 24 hr tablet Take 1 tablet by mouth 2 (two) times daily.     . fluPHENAZine (PROLIXIN) 2.5 MG tablet Take 2 tablets by mouth 2 (two) times daily.     . metroNIDAZOLE (FLAGYL) 500 MG tablet Take 1 tablet (500 mg total) by mouth 2 (two) times daily for 5 days. 10 tablet 0   . traZODone (DESYREL) 50 MG tablet Take 1 tablet by mouth at bedtime.       Patient Stressors: Medication change or noncompliance Other: Chronic mental illness  Patient Strengths: Average or above average intelligence Capable of independent living Communication skills  Treatment Modalities: Medication Management, Group therapy, Case management,  1 to 1 session with clinician, Psychoeducation, Recreational therapy.   Physician Treatment Plan for Primary Diagnosis: <principal problem not specified> Long Term Goal(s): Improvement in symptoms so as ready for discharge Improvement in symptoms so as ready for discharge   Short Term Goals: Ability to maintain clinical measurements within normal limits will improve Compliance with prescribed medications will improve Compliance  with prescribed medications will improve Ability to identify triggers associated with substance abuse/mental health issues will improve  Medication Management: Evaluate patient's response, side effects, and tolerance of medication regimen.  Therapeutic Interventions: 1 to 1 sessions, Unit Group sessions and Medication administration.  Evaluation of Outcomes: Not Met  Physician Treatment Plan for Secondary Diagnosis: Active Problems:   Schizophrenia ([place])  Long Term Goal(s): Improvement in symptoms so as ready for discharge Improvement in symptoms so as ready for discharge   Short Term Goals: Ability to maintain  clinical measurements within normal limits will improve Compliance with prescribed medications will improve Compliance with prescribed medications will improve Ability to identify triggers associated with substance abuse/mental health issues will improve     Medication Management: Evaluate patient's response, side effects, and tolerance of medication regimen.  Therapeutic Interventions: 1 to 1 sessions, Unit Group sessions and Medication administration.  Evaluation of Outcomes: Not Met   RN Treatment Plan for Primary Diagnosis: <principal problem not specified> Long Term Goal(s): Knowledge of disease and therapeutic regimen to maintain health will improve  Short Term Goals: Ability to identify and develop effective coping behaviors will improve and Compliance with prescribed medications will improve  Medication Management: RN will administer medications as ordered by provider, will assess and evaluate patient's response and provide education to patient for prescribed medication. RN will report any adverse and/or side effects to prescribing provider.  Therapeutic Interventions: 1 on 1 counseling sessions, Psychoeducation, Medication administration, Evaluate responses to treatment, Monitor vital signs and CBGs as ordered, Perform/monitor CIWA, COWS, AIMS and Fall Risk screenings as ordered, Perform wound care treatments as ordered.  Evaluation of Outcomes: Not Met   LCSW Treatment Plan for Primary Diagnosis: <principal problem not specified> Long Term Goal(s): Safe transition to appropriate next level of care at discharge, Engage patient in therapeutic group addressing interpersonal concerns.  Short Term Goals: Engage patient in aftercare planning with referrals and resources, Increase social support and Increase skills for wellness and recovery  Therapeutic Interventions: Assess for all discharge needs, 1 to 1 time with Social worker, Explore available resources and support systems,  Assess for adequacy in community support network, Educate family and significant other(s) on suicide prevention, Complete Psychosocial Assessment, Interpersonal group therapy.  Evaluation of Outcomes: Not Met   Progress in Treatment: Attending groups: No. Participating in groups: No. Taking medication as prescribed: Yes. Toleration medication: Yes. Family/Significant other contact made: No, will contact:  father Patient understands diagnosis: No. Discussing patient identified problems/goals with staff: Yes. Medical problems stabilized or resolved: Yes. Denies suicidal/homicidal ideation: Yes. Issues/concerns per patient self-inventory: No. Other: none  New problem(s) identified: No, Describe:  none  New Short Term/Long Term Goal(s):  Patient Goals:  "be on an even keel"  Discharge Plan or Barriers:   Reason for Continuation of Hospitalization: [place] Medication stabilization  Estimated Length of Stay: 3-5 days  Attendees: Patient: [name] 10/04/2018   Physician: Dr Jake Samples, MD 10/04/2018   Nursing: Neldon Newport, RN 10/04/2018   RN Care Manager: 10/04/2018   Social Worker: Lurline Idol, LCSW 10/04/2018   Recreational Therapist:  10/04/2018   Other:  10/04/2018   Other:  10/04/2018   Other: 10/04/2018        Scribe for Treatment Team: Joanne Chars, [name] 10/04/2018 3:10 PM

## 2018-10-04 NOTE — ED Notes (Signed)
Pt changed and wand by security.  Belongings at nurses' station.

## 2018-10-04 NOTE — Progress Notes (Signed)
Writer entered patients room and observed her lying in bed asleep. Writer called her name and she aroused and opened her eyes. Writer gave her hs medications and asked if she wanted snack. She received 2 ice creams as she requested. She ate snack and went back off to sleep. Safety maintained on unit with 15 min checks.

## 2018-10-04 NOTE — B[facility] Counselor (Signed)
Pt was assessed at [facility] as a walk-in. Per Patriciaann Clan, PA recommended inpatient treatment. TTS to seek placement.    Vertell Novak, MS, [facility], [facility] Triage Specialist [phone]

## 2018-10-04 NOTE — ED Triage Notes (Signed)
Pt sent from [facility] for medical clearance. They have a bed for her across the street.

## 2018-10-04 NOTE — ED Notes (Signed)
Bed: [facility] Expected date:  Expected time:  Means of arrival:  Comments: 

## 2018-10-04 NOTE — [facility] Suicide Risk Assessment (Signed)
[facility] Admission Suicide Risk Assessment   Nursing information obtained from:  Patient Demographic factors:  Living alone Current Mental Status:  Suicidal ideation indicated by patient, Self-harm thoughts, Intention to act on suicide plan Loss Factors:  NA Historical Factors:  NA Risk Reduction Factors:  NA  Total Time spent with patient: 45 minutes Principal Problem: Exacerbation and underlying psychotic disorder Diagnosis:  Active Problems:   Schizophrenia ([place])  Subjective Data: Patient once again presents with disorganization and psychosis see admission note  Continued Clinical Symptoms:  Alcohol Use Disorder Identification Test Final Score (AUDIT): 1 The "Alcohol Use Disorders Identification Test", Guidelines for Use in Primary Care, Second Edition.  World Pharmacologist [facility]). Score between 0-7:  no or low risk or alcohol related problems. Score between 8-15:  moderate risk of alcohol related problems. Score between 16-19:  high risk of alcohol related problems. Score 20 or above:  warrants further diagnostic evaluation for alcohol dependence and treatment.  Alert and oriented but disjointed in her statements see admission note  CLINICAL FACTORS:   Schizophrenia:   Paranoid or undifferentiated type COGNITIVE FEATURES THAT CONTRIBUTE TO RISK:  Loss of executive function    SUICIDE RISK:   Minimal: No identifiable suicidal ideation.  Patients presenting with no risk factors but with morbid ruminations; may be classified as minimal risk based on the severity of the depressive symptoms  PLAN OF CARE: Stabilized again with long-acting injectable this time  I certify that inpatient services furnished can reasonably be expected to improve the patient's condition.   Yvette Hai, MD 10/04/2018, 10:49 AM

## 2018-10-04 NOTE — Progress Notes (Signed)
Recreation Therapy Notes  Date: 2.7.20 Time: 1000 Location: 500 Hall Dayroom  Group Topic: Communication, Team Building, Problem Solving  Goal Area(s) Addresses:  Patient will effectively work with peer towards shared goal.  Patient will identify skill used to make activity successful.  Patient will identify how skills used during activity can be used to reach post d/c goals.   Intervention: STEM Activity   Activity: In team's, using 10 medium plastic cups, patients were asked to build the tallest free standing tower possible.    Education: Education officer, community, Dentist.   Education Outcome: Acknowledges education/In group clarification offered/Needs additional education.   Clinical Observations/Feedback: Pt did not attend group.    Victorino Sparrow, LRT/CTRS        Victorino Sparrow A 10/04/2018 12:20 PM
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
## 2018-10-04 NOTE — ED Notes (Signed)
Bed: [facility]1 Expected date:  Expected time:  Means of arrival:  Comments: 

## 2018-10-04 NOTE — Progress Notes (Signed)
Recreation Therapy Notes  Patient admitted to unit 2.7.20. Due to recent admission, no new assessment conducted at this time. Last assessment conducted 12.31.19. Patient reports "not staying even kill as her stressor for this admission.  Pt stated reason for admission was "I was acting out but not violently".   Patient denies SI, HI, AVH at this time. Patient reports goal of staying inside within normal limits and be safe and only come to the hospital when necessary.  Information found below from assessment conducted 12.31.19   Coping Skills:  Meditation; Music; Exercise; Deep Breathing; Journal; TV; Isolation; Talk; Arguments; Aggression; Hot Bath/Shower;  Read; Prayer  Leisure Interests:  Social with family; Walking, Relax  Patient Strengths:  Don't like to lie; Love people  Areas of Improvement:  Love others differently; Treat family better      Victorino Sparrow, LRT/CTRS     Victorino Sparrow A 10/04/2018 12:34 PM

## 2018-10-04 NOTE — Discharge Instructions (Addendum)
Transfer to [facility] for psychiatric treatment as planned

## 2018-10-04 NOTE — ED Provider Notes (Signed)
[facility] DEPT Provider Note   CSN: [id number] Arrival date & time: 10/04/18  0028     History   Chief Complaint Chief Complaint  Patient presents with  . Medical Clearance    HPI [name] is a 48 y.o. female.  Patient with history of schizoaffective disorder presents from [facility] where she was assessed for mania, passive suicidal statements, and found to meet inpatient criteria. She was sent here for medical clearance. She reports that she is concerned about a vaginal discharge and malodor x 3 days. No fever, vomiting, urinary symptoms, diarrhea or abdominal pain.   The history is provided by the patient. No language interpreter was used.    Past Medical History:  Diagnosis Date  . Anemia   . Anxiety   . Asthma   . Bipolar 1 disorder ([place])   . Breast cancer ([place])   . Depression   . Diabetes mellitus without complication ([place])   . Hypertension   . Insomnia, persistent   . Schizophrenic disorder ([place])   . Seizures [facility])     Patient Active Problem List   Diagnosis Date Noted  . Encounter for medical clearance for patient hold   . Noncompliance with treatment 07/16/2015  . Schizoaffective disorder, bipolar type ([place]) 01/11/2015    Past Surgical History:  Procedure Laterality Date  . BREAST SURGERY Left   . TONSILLECTOMY       OB History    Gravida  2   Para  2   Term  2   Preterm      AB      Living  2     SAB      TAB      Ectopic      Multiple      Live Births  2            Home Medications    Prior to Admission medications   Medication Sig Start Date End Date Taking? Authorizing Provider  albuterol (PROVENTIL HFA;VENTOLIN HFA) 108 (90 Base) MCG/ACT inhaler Inhale 2 puffs into the lungs every 6 (six) hours as needed for wheezing or shortness of breath. 08/30/18   Lindell Spar I, NP  budesonide-formoterol (SYMBICORT) 160-4.5 MCG/ACT inhaler Inhale 2 puffs into the lungs 2 (two) times daily. For Shortness  of breath 08/30/18   Lindell Spar I, NP  divalproex (DEPAKOTE) 250 MG DR tablet Take 3 tablets (750 mg total) by mouth at bedtime. For mood stabilization 08/30/18   Lindell Spar I, NP  divalproex (DEPAKOTE) 500 MG DR tablet Take 1 tablet (500 mg total) by mouth daily. For mood stabilization 08/31/18   Lindell Spar I, NP  hydrOXYzine (ATARAX/VISTARIL) 50 MG tablet Take 1 tablet (50 mg total) by mouth 3 (three) times daily as needed for anxiety. 08/30/18   Lindell Spar I, NP  mometasone-formoterol (DULERA) 200-5 MCG/ACT AERO Inhale 2 puffs into the lungs 2 (two) times daily. For shortness of breath 08/30/18   Lindell Spar I, NP  montelukast (SINGULAIR) 10 MG tablet Take 1 tablet (10 mg total) by mouth at bedtime. For Asthma 08/30/18   Lindell Spar I, NP  risperiDONE (RISPERDAL) 3 MG tablet Take 2 tablets (6 mg total) by mouth at bedtime. For mood control 08/31/18   Lindell Spar I, NP  traZODone (DESYREL) 100 MG tablet Take 1 tablet (100 mg total) by mouth at bedtime as needed for sleep. 08/30/18   Encarnacion Slates, NP    Family History Family History  Problem Relation Age of Onset  . Depression Mother   . Gout Mother   . Cancer Father        prostate  . Other Father        lung issue  . Alcoholism Other   . Heart attack Paternal Grandfather   . Heart attack Paternal Grandmother   . Heart attack Maternal Grandmother   . Heart attack Maternal Grandfather   . Depression Son   . Anxiety disorder Son     Social History Social History   Tobacco Use  . Smoking status: Former Smoker    Types: Cigarettes  . Smokeless tobacco: Never Used  Substance Use Topics  . Alcohol use: Yes    Comment: BAC not available at time of assessment  . Drug use: No     Allergies   Haldol [haloperidol decanoate]; Haloperidol; Penicillins; Pollen extract; and Shrimp [shellfish allergy]   Review of Systems Review of Systems  Constitutional: Negative for chills and fever.  HENT: Negative.   Respiratory: Negative.     Cardiovascular: Negative.   Gastrointestinal: Negative.   Genitourinary: Positive for vaginal discharge. Negative for dysuria and pelvic pain.  Musculoskeletal: Negative.   Skin: Negative.   Neurological: Negative.   Psychiatric/Behavioral: The patient is hyperactive.      Physical Exam Updated Vital Signs BP 120/85 (BP Location: Left Arm)   Pulse 83   Temp 98 F (36.7 C) (Oral)   Resp 16   Ht 5\' 8"  (1.727 m)   Wt 97.1 kg   SpO2 100%   BMI 32.54 kg/m   Physical Exam Vitals signs and nursing note reviewed.  Constitutional:      Appearance: She is well-developed.  HENT:     Head: Normocephalic.  Neck:     Musculoskeletal: Normal range of motion and neck supple.  Cardiovascular:     Rate and Rhythm: Normal rate and regular rhythm.  Pulmonary:     Effort: Pulmonary effort is normal.     Breath sounds: Normal breath sounds.  Abdominal:     General: Bowel sounds are normal.     Palpations: Abdomen is soft.     Tenderness: There is no abdominal tenderness. There is no guarding or rebound.  Genitourinary:    Vagina: Vaginal discharge present.     Cervix: Friability present. No cervical motion tenderness.     Uterus: Normal. Not tender.      Adnexa:        Right: No tenderness.         Left: No tenderness.    Musculoskeletal: Normal range of motion.  Skin:    General: Skin is warm and dry.     Findings: No rash.  Neurological:     Mental Status: She is alert and oriented to person, place, and time.      ED Treatments / Results  Labs (all labs ordered are listed, but only abnormal results are displayed) Labs Reviewed  WET PREP, GENITAL - Abnormal; Notable for the following components:      Result Value   Clue Cells Wet Prep HPF POC PRESENT (*)    All other components within normal limits  COMPREHENSIVE METABOLIC PANEL - Abnormal; Notable for the following components:   Glucose, Bld 105 (*)    All other components within normal limits  ACETAMINOPHEN LEVEL -  Abnormal; Notable for the following components:   Acetaminophen (Tylenol), Serum <10 (*)    All other components within normal limits  CBC - Abnormal;  Notable for the following components:   Hemoglobin 11.6 (*)    All other components within normal limits  URINALYSIS, ROUTINE W REFLEX MICROSCOPIC - Abnormal; Notable for the following components:   Ketones, ur 5 (*)    All other components within normal limits  ETHANOL  SALICYLATE LEVEL  RAPID URINE DRUG SCREEN, HOSP PERFORMED  I-STAT BETA HCG BLOOD, ED ([facility], [facility], [facility] ONLY)  GC/CHLAMYDIA PROBE AMP () NOT AT [facility]   Results for orders placed or performed during the hospital encounter of 10/04/18  Wet prep, genital  Result Value Ref Range   Yeast Wet Prep HPF POC NONE SEEN NONE SEEN   Trich, Wet Prep NONE SEEN NONE SEEN   Clue Cells Wet Prep HPF POC PRESENT (A) NONE SEEN   WBC, Wet Prep HPF POC NONE SEEN NONE SEEN   Sperm NONE SEEN   Comprehensive metabolic panel  Result Value Ref Range   Sodium 141 135 - 145 mmol/L   Potassium 3.5 3.5 - 5.1 mmol/L   Chloride 109 98 - 111 mmol/L   CO2 25 22 - 32 mmol/L   Glucose, Bld 105 (H) 70 - 99 mg/dL   BUN 9 6 - 20 mg/dL   Creatinine, Ser 0.67 0.44 - 1.00 mg/dL   Calcium 8.9 8.9 - 10.3 mg/dL   Total Protein 7.4 6.5 - 8.1 g/dL   Albumin 4.1 3.5 - 5.0 g/dL   AST 15 15 - 41 U/L   ALT 21 0 - 44 U/L   Alkaline Phosphatase 45 38 - 126 U/L   Total Bilirubin 0.4 0.3 - 1.2 mg/dL   GFR calc non Af Amer >60 >60 mL/min   GFR calc Af Amer >60 >60 mL/min   Anion gap 7 5 - 15  Ethanol  Result Value Ref Range   Alcohol, Ethyl (B) <82 <42 mg/dL  Salicylate level  Result Value Ref Range   Salicylate Lvl <3.5 2.8 - 30.0 mg/dL  Acetaminophen level  Result Value Ref Range   Acetaminophen (Tylenol), Serum <10 (L) 10 - 30 ug/mL  cbc  Result Value Ref Range   WBC 5.8 4.0 - 10.5 K/uL   RBC 3.92 3.87 - 5.11 MIL/uL   Hemoglobin 11.6 (L) 12.0 - 15.0 g/dL   HCT 36.8 36.0 - 46.0 %   MCV 93.9 80.0  - 100.0 fL   MCH 29.6 26.0 - 34.0 pg   MCHC 31.5 30.0 - 36.0 g/dL   RDW 12.2 11.5 - 15.5 %   Platelets 264 150 - 400 K/uL   nRBC 0.0 0.0 - 0.2 %  Rapid urine drug screen (hospital performed)  Result Value Ref Range   Opiates NONE DETECTED NONE DETECTED   Cocaine NONE DETECTED NONE DETECTED   Benzodiazepines NONE DETECTED NONE DETECTED   Amphetamines NONE DETECTED NONE DETECTED   Tetrahydrocannabinol NONE DETECTED NONE DETECTED   Barbiturates NONE DETECTED NONE DETECTED  Urinalysis, Routine w reflex microscopic  Result Value Ref Range   Color, Urine YELLOW YELLOW   APPearance CLEAR CLEAR   Specific Gravity, Urine 1.028 1.005 - 1.030   pH 5.0 5.0 - 8.0   Glucose, UA NEGATIVE NEGATIVE mg/dL   Hgb urine dipstick NEGATIVE NEGATIVE   Bilirubin Urine NEGATIVE NEGATIVE   Ketones, ur 5 (A) NEGATIVE mg/dL   Protein, ur NEGATIVE NEGATIVE mg/dL   Nitrite NEGATIVE NEGATIVE   Leukocytes, UA NEGATIVE NEGATIVE  I-Stat beta hCG blood, ED  Result Value Ref Range   I-stat hCG, quantitative <5.0 <5  mIU/mL   Comment 3            EKG None  Radiology No results found.  Procedures Procedures (including critical care time)  Medications Ordered in ED Medications - No data to display   Initial Impression / Assessment and Plan / ED Course  I have reviewed the triage vital signs and the nursing notes.  Pertinent labs & imaging results that were available during my care of the patient were reviewed by me and considered in my medical decision making (see chart for details).     Patient to ED for medical clearance for admission to [facility] where she has already been seen and assessed and found to meet inpatient criteria for mania, passive SI, h/o schizoaffective disorder.  Here she complains of vaginal discharge. This was assessed and she has been diagnosed with BV. Will treat with Flagyl for the next 5 days.   She is considered medically cleared for return to [facility] for treatment of psychiatric  condition.  Final Clinical Impressions(s) / ED Diagnoses   Final diagnoses:  None   1. Manic 2. [place]  ED Discharge Orders    None       Charlann Lange, PA-C 10/04/18 3614    Maudie Flakes, MD 10/04/18 [phone]

## 2018-10-04 NOTE — ED Notes (Signed)
Bed: [facility]3 Expected date:  Expected time:  Means of arrival:  Comments: 

## 2018-10-04 NOTE — ED Notes (Signed)
[facility] arrived to take pt

## 2018-10-04 NOTE — ED Notes (Signed)
Bed: WTR9 Expected date:  Expected time:  Means of arrival:  Comments: 

## 2018-10-04 NOTE — Progress Notes (Signed)
[name] is a 48 year old female being admitted voluntarily to [facility] as a walk in to [facility].  She came in for suicidal ideation, mania, disorganization and speaking incoherently.  She was recently d/c from [facility] on 08/30/18.  She was diagnosed with bacterial vaginosis and prescribed Flagyl in the ED (prescription placed in the front of the paper chart).  During [facility] admission, she was pleasant but disorganized, tangential and labile.  Speech was rapid and incoherent.  She denied SI/HI or A/V hallucination.  She denied any pain or discomfort and appeared to be in no physical distress.  Oriented her to the unit.  Admission paperwork completed and signed.  Belongings searched and secured in locker # 75.  Skin assessment completed and no skin issues noted.  Q 15 minute checks initiated for safety.  We will monitor the progress towards her goals.

## 2018-10-05 DIAGNOSIS — F201 Disorganized schizophrenia: Secondary | ICD-10-CM

## 2018-10-05 LAB — GC/CHLAMYDIA PROBE AMP (~~LOC~~) NOT AT ARMC
Chlamydia: NEGATIVE
Neisseria Gonorrhea: POSITIVE — AB
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
## 2018-10-05 NOTE — Plan of Care (Signed)
  Problem: Safety: Goal: Periods of time without injury will increase Outcome: Progressing  DAR NOTE: Patient presents with anxious affect and depressed mood.  Denies suicidal thoughts, auditory and visual hallucinations.  Rates depression at 3, hopelessness at 0, and anxiety at 5.  Maintained on routine safety checks.  Medications given as prescribed.  Support and encouragement offered as needed.  Attended group and participated.  States goal for today is "stay sober and abstain from drinking."  Patient observed talking to herself in the dayroom. Complain of sore throat and generalized discomfort.

## 2018-10-05 NOTE — BHH Group Notes (Signed)
  [facility] LCSW Group Therapy Note  Date/Time:  10/05/2018 11:15AM-12:00PM  Type of Therapy and Topic:  Group Therapy:  Avoiding Rehospitalization  Participation Level:  Active   Description of Group This process group involved patients discussing their plans related to taking care of themselves at discharge in order to avoid, if at all possible, being rehospitalized in the future.  The group brainstormed specific ways in which they will be able to implement these plans.    Therapeutic Goals 1. Patient will identify and describe 2-3 specific tasks they plan to undertake in order to maximize their wellness and avoid coming back to a hospital. 2. Patient will verbalize benefits of each task described, as well as barriers to implementation. 3. Patients will brainstorm ways to implement their plans.  Summary of Patient Progress:  The patient expressed her plans for self-care at discharge include taking her medications, being prepare with water in case it gets lost, keeping her apartment clean and helping others.  The way in which this will help is to help her stay stable and to avoid isolation.  The patient participated fully and somewhat intrusively at times with an affect that was blunted.  Therapeutic Modalities Stages of Change theory Motivational Interviewing    Selmer Dominion, LCSW 10/05/2018, 1:00 PM

## 2018-10-06 LAB — INFLUENZA PANEL BY PCR (TYPE A & B)
INFLBPCR: NEGATIVE
Influenza A By PCR: NEGATIVE

## 2018-10-06 MED ORDER — DIVALPROEX SODIUM ER 250 MG PO TB24
750.0000 mg | ORAL_TABLET | Freq: Every day | ORAL | Status: DC
  Administered 2018-10-07: 750 mg via ORAL
  Filled 2018-10-06 (×4): qty 3

## 2018-10-06 MED ORDER — TRAZODONE HCL 100 MG PO TABS
100.0000 mg | ORAL_TABLET | Freq: Every evening | ORAL | Status: DC | PRN
  Administered 2018-10-06 – 2018-10-08 (×3): 100 mg via ORAL
  Filled 2018-10-06 (×9): qty 1

## 2018-10-06 MED ORDER — DIVALPROEX SODIUM ER 500 MG PO TB24
500.0000 mg | ORAL_TABLET | Freq: Every day | ORAL | Status: DC
  Administered 2018-10-07 – 2018-10-08 (×2): 500 mg via ORAL
  Filled 2018-10-06 (×4): qty 1

## 2018-10-06 NOTE — BHH Group Notes (Signed)
[facility] LCSW Group Therapy Note  Date/Time:  10/06/2018  11:00AM-12:00PM  Type of Therapy and Topic:  Group Therapy:  Music and Mood  Participation Level:  Active   Description of Group: In this process group, members listened to a variety of genres of music and identified that different types of music evoke different responses.  Patients were encouraged to identify music that was soothing for them and music that was energizing for them.  Patients discussed how this knowledge can help with wellness and recovery in various ways including managing depression and anxiety as well as encouraging healthy sleep habits.    Therapeutic Goals: 1. Patients will explore the impact of different varieties of music on mood 2. Patients will verbalize the thoughts they have when listening to different types of music 3. Patients will identify music that is soothing to them as well as music that is energizing to them 4. Patients will discuss how to use this knowledge to assist in maintaining wellness and recovery 5. Patients will explore the use of music as a coping skill  Summary of Patient Progress:  At the beginning of group, patient expressed that she felt a great deal of disappointment in herself.  At the end of group she said she felt "better."  She was responsive and involved throughout the entire group.  Therapeutic Modalities: Solution Focused Brief Therapy Activity   Selmer Dominion, LCSW

## 2018-10-06 NOTE — Progress Notes (Addendum)
[name] MD Progress Note  10/06/2018 12:47 PM [name]  MRN:  [id number] Subjective:  "I'm alright."  [name] sitting in dayroom. She is calm and cooperative with assessment but continues to exhibit pressured speech with flight of ideas- "They did a swab of my nose. I think I have the same thing as my boyfriend. He's the only guy I dated after my husband. My husband and I were together 30 years. I'm a Muslim. Some of my children are illegimate and some aren't." Per chart review she only slept 3.5 hours last night after trazodone admin. When asked about sleep, patient reports difficulty sleeping due to high energy and then starts telling a story about rats. Flu swab done due to sore throat, nasal congestion and cough- results pending. Patient is wearing a mask on the unit. Denies SI, HI, AVH. Denies medication side effects. No agitated or disruptive behaviors on the unit.   From admission H&P 10/04/2018: [name] is known to the service recently admitted here has discussed, discharged on January 3, however she has re-presented once again with disorganized thought and behavior, she presented as a walk-in and she was described as manic, displaying hysterical crying, speaking in an incoherent and disorganized fashion. She had some more organization while she was in the emergency department but is still not baseline. Continues to talk about rambling stories, walking on a bridge passing out so forth again this is typical of her presentations to have a disjointed somewhat incoherent story but at the same time she is now improved. Currently is alert oriented to person place situation time overall cooperative again focused on the events prior to hospitalization that are disjointed but denying current hallucinations, stating that she would like to go back on long-acting injectable medications. No involuntary movements.  Principal Problem: Schizophrenia ([place]) Diagnosis: Principal Problem:   Schizophrenia  ([place])  Total Time spent with patient: 15 minutes  Past Psychiatric History: See admission H&P  Past Medical History:  Past Medical History:  Diagnosis Date  . Anemia   . Anxiety   . Asthma   . Bipolar 1 disorder ([place])   . Breast cancer ([place])   . Depression   . Diabetes mellitus without complication ([place])   . Hypertension   . Insomnia, persistent   . Schizophrenic disorder ([place])   . Seizures ([place])     Past Surgical History:  Procedure Laterality Date  . BREAST SURGERY Left   . TONSILLECTOMY     Family History:  Family History  Problem Relation Age of Onset  . Depression Mother   . Gout Mother   . Cancer Father        prostate  . Other Father        lung issue  . Alcoholism Other   . Heart attack Paternal Grandfather   . Heart attack Paternal Grandmother   . Heart attack Maternal Grandmother   . Heart attack Maternal Grandfather   . Depression Son   . Anxiety disorder Son    Family Psychiatric  History: See admission H&P Social History:  Social History   Substance and Sexual Activity  Alcohol Use Yes   Comment: BAC not available at time of assessment     Social History   Substance and Sexual Activity  Drug Use No    Social History   Socioeconomic History  . Marital status: Single    Spouse name: Not on file  . Number of children: Not on file  . Years of education:  Not on file  . Highest education level: Not on file  Occupational History  . Not on file  Social Needs  . Financial resource strain: Not on file  . Food insecurity:    Worry: Not on file    Inability: Not on file  . Transportation needs:    Medical: Not on file    Non-medical: Not on file  Tobacco Use  . Smoking status: Former Smoker    Types: Cigarettes  . Smokeless tobacco: Never Used  Substance and Sexual Activity  . Alcohol use: Yes    Comment: BAC not available at time of assessment  . Drug use: No  . Sexual activity: Never    Birth control/protection: Pill  Lifestyle   . Physical activity:    Days per week: Not on file    Minutes per session: Not on file  . Stress: Not on file  Relationships  . Social connections:    Talks on phone: Not on file    Gets together: Not on file    Attends religious service: Not on file    Active member of club or organization: Not on file    Attends meetings of clubs or organizations: Not on file    Relationship status: Not on file  Other Topics Concern  . Not on file  Social History Narrative  . Not on file   Additional Social History:                         Sleep: Poor  Appetite:  Good  Current Medications: Current Facility-Administered Medications  Medication Dose Route Frequency Provider Last Rate Last Dose  . acetaminophen (TYLENOL) tablet 650 mg  650 mg Oral Q6H PRN Patriciaann Clan E, PA-C      . albuterol (PROVENTIL HFA;VENTOLIN HFA) 108 (90 Base) MCG/ACT inhaler 2 puff  2 puff Inhalation Q6H PRN [name], MD      . alum & mag hydroxide-simeth (MAALOX/MYLANTA) 200-200-20 MG/5ML suspension 30 mL  30 mL Oral Q4H PRN Laverle Hobby, PA-C      . benztropine (COGENTIN) tablet 0.5 mg  0.5 mg Oral BID [name], MD   0.5 mg at 10/06/18 0175  . divalproex (DEPAKOTE ER) 24 hr tablet 500 mg  500 mg Oral BID Patrecia Pour, NP   500 mg at 10/06/18 0813  . hydrOXYzine (ATARAX/VISTARIL) tablet 25 mg  25 mg Oral Q6H PRN Laverle Hobby, PA-C   25 mg at 10/06/18 0309  . magnesium hydroxide (MILK OF MAGNESIA) suspension 30 mL  30 mL Oral Daily PRN Laverle Hobby, PA-C      . risperiDONE (RISPERDAL) tablet 3 mg  3 mg Oral BID [name], MD   3 mg at 10/06/18 1025  . traZODone (DESYREL) tablet 50 mg  50 mg Oral QHS,MR X 1 Laverle Hobby, PA-C   50 mg at 10/05/18 2300    Lab Results: No results found for this or any previous visit (from the past 48 hour(s)).  Blood Alcohol level:  Lab Results  Component Value Date   [facility] <10 10/04/2018   ETH <10 85/27/7824    Metabolic Disorder Labs: Lab  Results  Component Value Date   HGBA1C 5.0 08/27/2018   MPG 96.8 08/27/2018   MPG 96.8 11/21/2017   Lab Results  Component Value Date   PROLACTIN 7.5 08/27/2018   PROLACTIN 5.4 11/21/2017   Lab Results  Component Value Date   CHOL  179 08/27/2018   TRIG 54 08/27/2018   HDL 57 08/27/2018   CHOLHDL 3.1 08/27/2018   VLDL 11 08/27/2018   LDLCALC 111 (H) 08/27/2018   LDLCALC 115 (H) 11/21/2017    Physical Findings: AIMS: Facial and Oral Movements Muscles of Facial Expression: None, normal Lips and Perioral Area: None, normal Jaw: None, normal Tongue: None, normal,Extremity Movements Upper (arms, wrists, hands, fingers): None, normal Lower (legs, knees, ankles, toes): None, normal, Trunk Movements Neck, shoulders, hips: None, normal, Overall Severity Severity of abnormal movements (highest score from questions above): None, normal Incapacitation due to abnormal movements: None, normal [name] awareness of abnormal movements (rate only patient's report): No Awareness, Dental Status Current problems with teeth and/or dentures?: No Does patient usually wear dentures?: No  CIWA:    COWS:     Musculoskeletal: Strength & Muscle Tone: within normal limits Gait & Station: normal Patient leans: N/A  Psychiatric Specialty Exam: Physical Exam  Nursing note and vitals reviewed. Constitutional: She is oriented to person, place, and time. She appears well-developed and well-nourished.  Cardiovascular: Normal rate.  Respiratory: Effort normal.  Neurological: She is alert and oriented to person, place, and time.    Review of Systems  Constitutional: Negative.   HENT: Positive for congestion and sore throat.   Respiratory: Positive for cough.   Gastrointestinal: Negative for nausea and vomiting.  Psychiatric/Behavioral: Negative for depression, hallucinations, memory loss, substance abuse and suicidal ideas. The patient has insomnia. The patient is not nervous/anxious.     Blood  pressure (!) 142/88, pulse (!) 108, temperature 98.4 F (36.9 C), temperature source Oral, resp. rate 18, height 5\' 8"  (1.727 m), weight 97.1 kg.Body mass index is 32.54 kg/m.  General Appearance: Casual  Eye Contact:  Good  Speech:  Pressured  Volume:  Normal  Mood:  Euthymic  Affect:  Congruent  Thought Process:  Disorganized  Orientation:  Full (Time, Place, and Person)  Thought Content:  Tangential  Suicidal Thoughts:  No  Homicidal Thoughts:  No  Memory:  Immediate;   Good  Judgement:  Impaired  Insight:  Lacking  Psychomotor Activity:  Normal  Concentration:  Concentration: Fair  Recall:  [employer] of Knowledge:  Fair  Language:  Fair  Akathisia:  No  Handed:  Right  AIMS (if indicated):     Assets:  Communication Skills Desire for Improvement Leisure Time Physical Health  ADL's:  Intact  Cognition:  WNL  Sleep:  Number of Hours: 3     Treatment Plan Summary: Daily contact with patient to assess and evaluate symptoms and progress in treatment and Medication management   Continue inpatient hospitalization.  Flu swab results pending Check Depakote level in AM. Increase Depakote to 500 mg PO QAM, 750 PO QHS starting 2/10. Continue Risperdal 3 mg PO BID for psychosis Continue Cogentin 0.5 mg PO BID for EPS Continue trazodone 50 mg PO QHS PRN insomnia Continue Vistaril 25 mg PO Q6HR PRN anxiety  Patient will participate in the therapeutic group milieu.  Discharge disposition in progress.   Connye Burkitt, NP 10/06/2018, 12:47 PM   .Agree with NP Progress Note

## 2018-10-06 NOTE — Progress Notes (Signed)
DAR NOTE: Pt present with calm affect. Pt has been having a deep cough, been up most of the night even after taking 50 mg trazodone with a repeat. Pt has been pacing on the whole way, vistaril 25 mg PO given.  Pt's safety ensured with 15 minute and environmental checks. Pt currently denies SI/HI and A/V hallucinations. Pt verbally agrees to seek staff if SI/HI or A/VH occurs and to consult with staff before acting on these thoughts. Will continue POC.

## 2018-10-06 NOTE — Plan of Care (Signed)
D: Patient presents disorganized, hyperverbal. She complains of blurred vision and generalized pain today, but denies other physical symptoms. The RN swabbed her for the flu this morning, but it wasn't labeled correctly so they couldn't process it. I obtained a repeat swab this evening. Patient instructed to continue to wear mask. She filled out a self-inventory, but it is full of bizarre numbers, figures and run-on sentences that don't make sense. She reported continued sinus drainage. Patient denies SI/HI/AVH.  A: Patient checked q15 min, and checks reviewed. Reviewed medication changes with patient and educated on side effects. Educated patient on importance of attending group therapy sessions and educated on several coping skills. Encouarged participation in milieu through recreation therapy and attending meals with peers. Support and encouragement provided. Fluids offered. R: Patient receptive to education on medications, and is medication compliant. Patient contracts for safety on the unit.  Problem: Activity: Goal: Interest or engagement in activities will improve Outcome: Progressing Goal: Sleeping patterns will improve Outcome: Progressing   Problem: Education: Goal: Emotional status will improve Outcome: Not Progressing Goal: Mental status will improve Outcome: Not Progressing

## 2018-10-06 NOTE — Progress Notes (Signed)
Report given by O. Meryle Ready RN she reports that patient had a good day and has been sleeping since shift change. Safety maintained on unit with 15 min checks. Patient given hs medications and linens changed on bed before she returned to bed to sleep.

## 2018-10-07 LAB — VALPROIC ACID LEVEL: Valproic Acid Lvl: 89 ug/mL (ref 50.0–100.0)

## 2018-10-07 MED ORDER — CEFTRIAXONE SODIUM 250 MG IJ SOLR
250.0000 mg | Freq: Once | INTRAMUSCULAR | Status: AC
  Administered 2018-10-08: 250 mg via INTRAMUSCULAR
  Filled 2018-10-07 (×2): qty 250

## 2018-10-07 MED ORDER — AZITHROMYCIN 500 MG PO TABS
1000.0000 mg | ORAL_TABLET | Freq: Once | ORAL | Status: AC
  Administered 2018-10-07: 1000 mg via ORAL
  Filled 2018-10-07: qty 2

## 2018-10-07 NOTE — Plan of Care (Signed)
Progress note  D: pt found in bed; compliant with medication administration. Pt states she slept ok. Pt rates her depression/hopelessness/anxiety a 10/0/9 out of 10 respectively. Pt denies any complaints or pain, rating her pain a 0/10. Pt denies si/hi/ah/vh and verbally agrees to approach staff if these become apparent or before harming herself while at [facility]. A: pt provided support and encouragement. Pt given medication per protocol and standing orders. Q28m safety checks implemented and continued. R: pt safe on the unit. Will continue to monitor.   Pt progressing in the following metrics  Problem: Coping: Goal: Ability to verbalize frustrations and anger appropriately will improve Outcome: Progressing Goal: Ability to demonstrate self-control will improve Outcome: Progressing   Problem: Health Behavior/Discharge Planning: Goal: Identification of resources available to assist in meeting health care needs will improve Outcome: Progressing Goal: Compliance with treatment plan for underlying cause of condition will improve Outcome: Progressing

## 2018-10-07 NOTE — Progress Notes (Signed)
D: Pt denies SI/HI/AVH. Pt is pleasant and cooperative. Pt stated she was feeling better A: Pt was offered support and encouragement. Pt was given scheduled medications. Pt was encourage to attend groups. Q 15 minute checks were done for safety.  R:Pt attends groups and interacts well with peers and staff. Pt is taking medication. Pt has no complaints.Pt receptive to treatment and safety maintained on unit.  Problem: Education: Goal: Emotional status will improve Outcome: Progressing   Problem: Education: Goal: Mental status will improve Outcome: Progressing   Problem: Activity: Goal: Sleeping patterns will improve Outcome: Progressing

## 2018-10-07 NOTE — Progress Notes (Signed)
[facility] MD Progress Note  10/07/2018 10:23 AM [place]  MRN:  [id number] Subjective:    Patient states she realizes that her trigger was a holiday but she states she is much improved now she does not have thoughts of harming herself her mood is stable no hallucinations eager for discharge probably tomorrow no EPS or TD discussed meds in detail  Principal Problem: Schizophrenia ([place]) Diagnosis: Principal Problem:   Schizophrenia ([place])  Total Time spent with patient: 20 minutes   Past Medical History:  Past Medical History:  Diagnosis Date  . Anemia   . Anxiety   . Asthma   . Bipolar 1 disorder ([place])   . Breast cancer ([place])   . Depression   . Diabetes mellitus without complication ([place])   . Hypertension   . Insomnia, persistent   . Schizophrenic disorder ([place])   . Seizures ([place])     Past Surgical History:  Procedure Laterality Date  . BREAST SURGERY Left   . TONSILLECTOMY     Family History:  Family History  Problem Relation Age of Onset  . Depression Mother   . Gout Mother   . Cancer Father        prostate  . Other Father        lung issue  . Alcoholism Other   . Heart attack Paternal Grandfather   . Heart attack Paternal Grandmother   . Heart attack Maternal Grandmother   . Heart attack Maternal Grandfather   . Depression Son   . Anxiety disorder Son     Social History:  Social History   Substance and Sexual Activity  Alcohol Use Yes   Comment: BAC not available at time of assessment     Social History   Substance and Sexual Activity  Drug Use No    Social History   Socioeconomic History  . Marital status: Single    Spouse name: Not on file  . Number of children: Not on file  . Years of education: Not on file  . Highest education level: Not on file  Occupational History  . Not on file  Social Needs  . Financial resource strain: Not on file  . Food insecurity:    Worry: Not on file    Inability: Not on file  . Transportation needs:   Medical: Not on file    Non-medical: Not on file  Tobacco Use  . Smoking status: Former Smoker    Types: Cigarettes  . Smokeless tobacco: Never Used  Substance and Sexual Activity  . Alcohol use: Yes    Comment: BAC not available at time of assessment  . Drug use: No  . Sexual activity: Never    Birth control/protection: Pill  Lifestyle  . Physical activity:    Days per week: Not on file    Minutes per session: Not on file  . Stress: Not on file  Relationships  . Social connections:    Talks on phone: Not on file    Gets together: Not on file    Attends religious service: Not on file    Active member of club or organization: Not on file    Attends meetings of clubs or organizations: Not on file    Relationship status: Not on file  Other Topics Concern  . Not on file  Social History Narrative  . Not on file   Additional Social History:  Sleep: Good  Appetite:  Good  Current Medications: Current Facility-Administered Medications  Medication Dose Route Frequency Provider Last Rate Last Dose  . acetaminophen (TYLENOL) tablet 650 mg  650 mg Oral Q6H PRN Patriciaann Clan E, PA-C      . albuterol (PROVENTIL HFA;VENTOLIN HFA) 108 (90 Base) MCG/ACT inhaler 2 puff  2 puff Inhalation Q6H PRN [name], MD   2 puff at 10/06/18 1718  . alum & mag hydroxide-simeth (MAALOX/MYLANTA) 200-200-20 MG/5ML suspension 30 mL  30 mL Oral Q4H PRN Laverle Hobby, PA-C      . benztropine (COGENTIN) tablet 0.5 mg  0.5 mg Oral BID [name], MD   0.5 mg at 10/07/18 0806  . divalproex (DEPAKOTE ER) 24 hr tablet 500 mg  500 mg Oral Daily Connye Burkitt, NP   500 mg at 10/07/18 7829  . divalproex (DEPAKOTE ER) 24 hr tablet 750 mg  750 mg Oral QHS Connye Burkitt, NP      . hydrOXYzine (ATARAX/VISTARIL) tablet 25 mg  25 mg Oral Q6H PRN Laverle Hobby, PA-C   25 mg at 10/06/18 2150  . magnesium hydroxide (MILK OF MAGNESIA) suspension 30 mL  30 mL Oral Daily PRN  Laverle Hobby, PA-C      . risperiDONE (RISPERDAL) tablet 3 mg  3 mg Oral BID [name], MD   3 mg at 10/07/18 0806  . traZODone (DESYREL) tablet 100 mg  100 mg Oral QHS,MR X 1 Connye Burkitt, NP   100 mg at 10/06/18 2150    Lab Results:  Results for orders placed or performed during the hospital encounter of 10/04/18 (from the past 48 hour(s))  Influenza panel by PCR (type A & B)     Status: None   Collection Time: 10/05/18  6:03 PM  Result Value Ref Range   Influenza A By PCR NEGATIVE NEGATIVE   Influenza B By PCR NEGATIVE NEGATIVE    Comment: (NOTE) The Xpert Xpress Flu assay is intended as an aid in the diagnosis of  influenza and should not be used as a sole basis for treatment.  This  assay is FDA approved for nasopharyngeal swab specimens only. Nasal  washings and aspirates are unacceptable for Xpert Xpress Flu testing. Performed at [facility], [place] [address], [place], [place] [address]     Blood Alcohol level:  Lab Results  Component Value Date   ETH <10 10/04/2018   ETH <10 08/65/7846    Metabolic Disorder Labs: Lab Results  Component Value Date   HGBA1C 5.0 08/27/2018   MPG 96.8 08/27/2018   MPG 96.8 11/21/2017   Lab Results  Component Value Date   PROLACTIN 7.5 08/27/2018   PROLACTIN 5.4 11/21/2017   Lab Results  Component Value Date   CHOL 179 08/27/2018   TRIG 54 08/27/2018   HDL 57 08/27/2018   CHOLHDL 3.1 08/27/2018   VLDL 11 08/27/2018   LDLCALC 111 (H) 08/27/2018   LDLCALC 115 (H) 11/21/2017    Physical Findings: AIMS: Facial and Oral Movements Muscles of Facial Expression: None, normal Lips and Perioral Area: None, normal Jaw: None, normal Tongue: None, normal,Extremity Movements Upper (arms, wrists, hands, fingers): None, normal Lower (legs, knees, ankles, toes): None, normal, Trunk Movements Neck, shoulders, hips: None, normal, Overall Severity Severity of abnormal movements (highest score from questions  above): None, normal Incapacitation due to abnormal movements: None, normal [name] awareness of abnormal movements (rate only patient's report): No Awareness, Dental Status Current  problems with teeth and/or dentures?: No Does patient usually wear dentures?: No  CIWA:  CIWA-Ar Total: 2 COWS:  COWS Total Score: 4  Musculoskeletal: Strength & Muscle Tone: within normal limits Gait & Station: normal Patient leans: N/A  Psychiatric Specialty Exam: Physical Exam  ROS  Blood pressure 129/90, pulse (!) 115, temperature 98.2 F (36.8 C), temperature source Oral, resp. rate 18, height 5\' 8"  (1.727 m), weight 97.1 kg.Body mass index is 32.54 kg/m.  General Appearance: Neat  Eye Contact:  Fair  Speech:  Clear and Coherent  Volume:  Normal  Mood:  Dysphoric  Affect:  Congruent and Constricted  Thought Process:  Coherent  Orientation:  Full  Thought Content:  Logical  Suicidal Thoughts:  No  Homicidal Thoughts:  No  Memory:  Immediate;   Good  Judgement:  Good  Insight:  Good  Psychomotor Activity:  EPS neg  Concentration:  Concentration: Fair  Recall:  [employer] of Knowledge:  Fair  Language:  Good  Akathisia:  Negative  Handed:  Right  AIMS (if indicated):     Assets:  Social Support Talents/Skills Transportation  ADL's:  Intact  Cognition:  WNL  Sleep:  Number of Hours: 4.75     Treatment Plan Summary: Daily contact with patient to assess and evaluate symptoms and progress in treatment, Medication management and Plan Continue current dose of antipsychotics probable discharge in 1 to 2 days no change in meds or precautions continue reality based therapies  Kobe Ofallon, MD 10/07/2018, 10:23 AM

## 2018-10-07 NOTE — Progress Notes (Addendum)
Recreation Therapy Notes  Date: 2.10.20 Time:  1000 Location: 500 Hall Dayroom    Group Topic: Coping Skills  Goal Area(s) Addresses:  Patient will identify positive coping skills. Patient will identify benefits of using coping skills.  Behavioral Response: Engaged  Intervention: Worksheet, pencils, white board, marker, eraser  Activity: Mind map.  LRT and patients filled in the first eight boxes with anxiety, anger, depression, finances, disrespect, death, loss of employment and relationships.  Patients were then given time to come up with at least 3 coping skills for each trigger identified.  Group would reconvene and LRT wrote the coping skills on the board.  Education: Radiographer, therapeutic, Dentist.   Education Outcome: Acknowledges understanding/In group clarification offered/Needs additional education.   Clinical Observations/Feedback: Pt stated coping skills help you deal with situations that you are faced with.  Pt identified some of her coping skills as anxiety- journal, exercise; anger- plan something to do, deep breaths; depression- read the bible, talk to support team; finances- get financial advise, community network; disrespect- refrain from making it worse, call the police; death- go to counseling, talk to family/friend, visit gravesite with flower or flags if they were in the [employer]; loss of employment- volunteer, do odd jobs; relationships- tell them you love them.    Victorino Sparrow, LRT/CTRS     Victorino Sparrow A 10/07/2018 11:16 AM

## 2018-10-07 NOTE — [facility] Suicide Risk Assessment (Signed)
[place] INPATIENT:  Family/Significant Other Suicide Prevention Education  Suicide Prevention Education:  Contact Attempts: Aniaya Bacha, father, [phone], has been identified by the patient as the family member/significant other with whom the patient will be residing, and identified as the person(s) who will aid the patient in the event of a mental health crisis.  With written consent from the patient, two attempts were made to provide suicide prevention education, prior to and/or following the patient's discharge.  We were unsuccessful in providing suicide prevention education.  A suicide education pamphlet was given to the patient to share with family/significant other.  Date and time of first attempt:10/07/18, 1248 Date and time of second attempt:  Joanne Chars, LCSW 10/07/2018, 12:48 PM

## 2018-10-07 NOTE — [facility] Group Notes (Signed)
[place] LCSW Group Therapy Note  Date/Time: 10/07/18, 1315  Type of Therapy and Topic:  Group Therapy:  Overcoming Obstacles  Participation Level:  Did not attend  Description of Group:    In this group patients will be encouraged to explore what they see as obstacles to their own wellness and recovery. They will be guided to discuss their thoughts, feelings, and behaviors related to these obstacles. The group will process together ways to cope with barriers, with attention given to specific choices patients can make. Each patient will be challenged to identify changes they are motivated to make in order to overcome their obstacles. This group will be process-oriented, with patients participating in exploration of their own experiences as well as giving and receiving support and challenge from other group members.  Therapeutic Goals: 1. Patient will identify personal and current obstacles as they relate to admission. 2. Patient will identify barriers that currently interfere with their wellness or overcoming obstacles.  3. Patient will identify feelings, thought process and behaviors related to these barriers. 4. Patient will identify two changes they are willing to make to overcome these obstacles:    Summary of Patient Progress      Therapeutic Modalities:   Cognitive Behavioral Therapy Solution Focused Therapy Motivational Interviewing Relapse Prevention Therapy  Lurline Idol, LCSW

## 2018-10-08 MED ORDER — BENZTROPINE MESYLATE 0.5 MG PO TABS: 0.5000 mg | ORAL_TABLET | Freq: Two times a day (BID) | ORAL | 0 refills | Status: DC

## 2018-10-08 MED ORDER — DIVALPROEX SODIUM ER 250 MG PO TB24: 750.0000 mg | ORAL_TABLET | Freq: Every day | ORAL | 0 refills | Status: DC

## 2018-10-08 MED ORDER — METRONIDAZOLE 500 MG PO TABS: 500.0000 mg | ORAL_TABLET | Freq: Two times a day (BID) | ORAL | 0 refills | Status: AC

## 2018-10-08 MED ORDER — HYDROXYZINE HCL 25 MG PO TABS: 25.0000 mg | ORAL_TABLET | Freq: Four times a day (QID) | ORAL | 0 refills | Status: DC | PRN

## 2018-10-08 MED ORDER — ARIPIPRAZOLE ER 400 MG IM SRER: 400.0000 mg | INTRAMUSCULAR | Status: DC

## 2018-10-08 MED ORDER — DIVALPROEX SODIUM ER 500 MG PO TB24: 500.0000 mg | ORAL_TABLET | Freq: Every day | ORAL | 0 refills | Status: DC

## 2018-10-08 MED ORDER — TRAZODONE HCL 100 MG PO TABS: 100.0000 mg | ORAL_TABLET | Freq: Every evening | ORAL | 0 refills | Status: DC | PRN

## 2018-10-08 MED ORDER — ARIPIPRAZOLE ER 400 MG IM SRER: 400.0000 mg | INTRAMUSCULAR | 0 refills | Status: DC

## 2018-10-08 MED ORDER — RISPERIDONE 3 MG PO TABS: 3.0000 mg | ORAL_TABLET | Freq: Two times a day (BID) | ORAL | 0 refills | Status: DC

## 2018-10-08 MED ORDER — BUDESONIDE-FORMOTEROL FUMARATE 160-4.5 MCG/ACT IN AERO: 2.0000 | INHALATION_SPRAY | Freq: Two times a day (BID) | RESPIRATORY_TRACT | 0 refills | Status: DC

## 2018-10-08 MED ORDER — METRONIDAZOLE 500 MG PO TABS
500.0000 mg | ORAL_TABLET | Freq: Two times a day (BID) | ORAL | Status: DC
  Filled 2018-10-08 (×2): qty 10

## 2018-10-08 MED ORDER — ALBUTEROL SULFATE HFA 108 (90 BASE) MCG/ACT IN AERS: 2.0000 | INHALATION_SPRAY | Freq: Four times a day (QID) | RESPIRATORY_TRACT | 0 refills | Status: DC | PRN

## 2018-10-08 NOTE — Plan of Care (Signed)
Pt participated in recreational therapy group sessions without prompting.   Victorino Sparrow, LRT/CTRS

## 2018-10-08 NOTE — Progress Notes (Signed)
Recreation Therapy Notes  Date: 2.11.20 Time: 1000 Location: [address] Dayroom  Group Topic:  Goal Setting  Goal Area(s) Addresses:  Patient will be able to identify at least 3 life goals.  Patient will be able to identify benefit of investing in life goals.  Patient will be able to identify benefit of setting life goals.   Behavioral Response:  Engaged  Intervention: Worksheet, pencils  Activity: Goal Planning.  Patients were to identify goals they want to accomplish in a week, a month, in 1 yr and in 5 yrs.  Patients were to then identify obstacles to achieving their goals, what they need to accomplish their goals and what they can start doing now to reach their goals.  Education:  Discharge Planning, Coping Skills, Life Goals   Education Outcome: Acknowledges Education/In Group Clarification Provided/Needs Additional Education  Clinical Observations: Pt expressed goals are something to work towards.  Pt also stated when you accomplish your goal, you should "tell others about it, teach or make a note of it".  If you don't accomplish your goal, pt stated you should "make a note of it and come back to it".  Pt stated in a week- take her medications on time and don't miss dosage; in a month- save money, utilize resources and re-establish herself; in a year- have a party or pray for the members of her support system; in 5 years- work her [employer], bring work skills up to par and donate to charity.  Pt expressed her obstacles would be finances, "allocate money correctly or I will lose my money and time", not isolate and time management.  Pt explained she could achieve her goals by doing basic budgeting and she could start now by staying sober or reducing the use of alcohol.      Victorino Sparrow, LRT/CTRS     Victorino Sparrow A 10/08/2018 11:27 AM

## 2018-10-08 NOTE — [facility] Suicide Risk Assessment (Signed)
[facility] Discharge Suicide Risk Assessment   Principal Problem: Schizophrenia [facility]) Discharge Diagnoses: Principal Problem:   Schizophrenia ([place])   Total Time spent with patient: 45 minutes Mental Status Per Nursing Assessment::   On Admission:  Suicidal ideation indicated by patient, Self-harm thoughts, Intention to act on suicide plan  Currently alert oriented and cooperative without thoughts of harming self or others without acute psychosis seems stable enough for discharge seems baseline  Demographic Factors:  Low socioeconomic status  Loss Factors: Decline in physical health  Historical Factors: NA  Risk Reduction Factors:   Religious beliefs about death  Continued Clinical Symptoms:  Schizophrenia:   Paranoid or undifferentiated type  Cognitive Features That Contribute To Risk:  None    Suicide Risk:  Minimal: No identifiable suicidal ideation.  Patients presenting with no risk factors but with morbid ruminations; may be classified as minimal risk based on the severity of the depressive symptoms  Follow-up Information    [facility] Follow up on 10/15/2018.   Specialty:  Family Medicine Why:  Your hospital follow up appointment is Tuesday, 2/18 at 10:45a. Please bring current medications and discharge paperwork from this hospitalization.  Contact information: [address] [place] [address] [phone]           Plan Of Care/Follow-up recommendations:  Activity:  full  Zyree Traynham, MD 10/08/2018, 9:37 AM

## 2018-10-08 NOTE — Progress Notes (Signed)
  [facility] Adult Case Management Discharge Plan :  Will you be returning to the same living situation after discharge:  Yes,  going home At discharge, do you have transportation home?: Yes,  bus pass Do you have the ability to pay for your medications: Yes,  [employer] Medicare  Release of information consent forms completed and in the chart;  Bus pass and letter on chart  Patient to Follow up at: Follow-up Information    [facility] Follow up on 10/15/2018.   Specialty:  Family Medicine Why:  Your hospital follow up appointment is Tuesday, 2/18 at 10:45a. Please bring current medications and discharge paperwork from this hospitalization.  Contact information: [address] [address] [phone]           Next level of care provider has access to [place] and Suicide Prevention discussed: Yes,  with patien  Have you used any form of tobacco in the last 30 days? (Cigarettes, Smokeless Tobacco, Cigars, and/or Pipes): No  Has patient been referred to the Quitline?: N/A patient is not a smoker  Patient has been referred for addiction treatment: Yes  Joellen [place], [place] 10/08/2018, 9:33 AM

## 2018-10-08 NOTE — Progress Notes (Signed)
Recreation Therapy Notes  INPATIENT RECREATION TR PLAN  Patient Details Name: Suhailah Kwan MRN: [id number] DOB: 02-22-1971 Today's Date: 10/08/2018  Rec Therapy Plan Is patient appropriate for Therapeutic Recreation?: Yes Treatment times per week: about 3 days Estimated Length of Stay: 5-7 days TR Treatment/Interventions: Group participation (Comment)  Discharge Criteria Pt will be discharged from therapy if:: Discharged Treatment plan/goals/alternatives discussed and agreed upon by:: Patient/family  Discharge Summary Short term goals set: See patient care plan Short term goals met: Complete Progress toward goals comments: Groups attended Which groups?: Goal setting, Coping skills Reason goals not met: None Therapeutic equipment acquired: N/A Reason patient discharged from therapy: Discharge from hospital Pt/family agrees with progress & goals achieved: Yes Date patient discharged from therapy: 10/08/18    Victorino Sparrow, LRT/CTRS  Ria Comment, Leisha Trinkle A 10/08/2018, 11:56 AM

## 2018-10-08 NOTE — Progress Notes (Signed)
D: Pt A & O X 3. Speech is tangential and pressured but logical. Denies SI, HI, AVH and pain at this time. D/C home as ordered. Bus pass given for transportation. A: D/C instructions reviewed with pt including prescriptions and follow up appointment; compliance encouraged. All belongings from locker #13 given to pt at time of departure. Scheduled medications given with verbal education and effects monitored. Safety checks maintained without incident till time of d/c.  R: Pt receptive to care. Compliant with medications when offered. Denies adverse drug reactions when assessed. Verbalized understanding related to d/c instructions. Signed belonging sheet in agreement with items received from locker. Ambulatory with a steady gait. Appears to be in no physical distress at time of departure.

## 2018-10-08 NOTE — Discharge Summary (Signed)
Physician Discharge Summary Note  Patient:  [name] is an 48 y.o., female  MRN:  [id number]  DOB:  1971-08-19  Patient phone:  [phone] (home)   Patient address:   [address] [place] [address],   Total Time spent with patient: Greater than 30 minutes  Date of Admission:  10/04/2018   Date of Discharge: 10/08/2018  Reason for Admission: Dorganized thought & behavior,.  Principal Problem: Schizophrenia [facility])  Discharge Diagnoses: Patient Active Problem List   Diagnosis Date Noted  . Schizoaffective disorder, bipolar type ([place]) [F25.0] 01/11/2015    Priority: High  . Schizophrenia ([place]) [F20.9] 10/04/2018  . Encounter for medical clearance for patient hold [Z00.8]   . Noncompliance with treatment [Z91.19] 07/16/2015   Past Psychiatric History: Schizoaffective disorder, Bipolar-type.  Past Medical History:  Past Medical History:  Diagnosis Date  . Anemia   . Anxiety   . Asthma   . Bipolar 1 disorder ([place])   . Breast cancer ([place])   . Depression   . Diabetes mellitus without complication ([place])   . Hypertension   . Insomnia, persistent   . Schizophrenic disorder ([place])   . Seizures ([place])     Past Surgical History:  Procedure Laterality Date  . BREAST SURGERY Left   . TONSILLECTOMY     Family History:  Family History  Problem Relation Age of Onset  . Depression Mother   . Gout Mother   . Cancer Father        prostate  . Other Father        lung issue  . Alcoholism Other   . Heart attack Paternal Grandfather   . Heart attack Paternal Grandmother   . Heart attack Maternal Grandmother   . Heart attack Maternal Grandfather   . Depression Son   . Anxiety disorder Son    Family Psychiatric  History: See H&P.  Social History:  Social History   Substance and Sexual Activity  Alcohol Use Yes   Comment: BAC not available at time of assessment     Social History   Substance and Sexual Activity  Drug Use No    Social History    Socioeconomic History  . Marital status: Single    Spouse name: Not on file  . Number of children: Not on file  . Years of education: Not on file  . Highest education level: Not on file  Occupational History  . Not on file  Social Needs  . Financial resource strain: Not on file  . Food insecurity:    Worry: Not on file    Inability: Not on file  . Transportation needs:    Medical: Not on file    Non-medical: Not on file  Tobacco Use  . Smoking status: Former Smoker    Types: Cigarettes  . Smokeless tobacco: Never Used  Substance and Sexual Activity  . Alcohol use: Yes    Comment: BAC not available at time of assessment  . Drug use: No  . Sexual activity: Never    Birth control/protection: Pill  Lifestyle  . Physical activity:    Days per week: Not on file    Minutes per session: Not on file  . Stress: Not on file  Relationships  . Social connections:    Talks on phone: Not on file    Gets together: Not on file    Attends religious service: Not on file    Active member of club or organization: Not on file  Attends meetings of clubs or organizations: Not on file    Relationship status: Not on file  Other Topics Concern  . Not on file  Social History Narrative  . Not on file   Hospital Course: (Per admission evaluation): Ms. Hinzman is known to the service recently admitted here has discussed, discharged on January 3, however she has re-presented once again with disorganized thought and behavior, she presented as a walk-in and she was described as manic, displaying hysterical crying, speaking in an incoherent and disorganized fashion.  She had some more organization while she was in the emergency department but is still not baseline. Continues to talk about rambling stories, walking on a bridge passing out so forth again this is typical of her presentations to have a disjointed somewhat incoherent story but at the same time she is now improved. Currently is alert  oriented to person place situation time overall cooperative again focused on the events prior to hospitalization that are disjointed but denying current hallucinations, stating that she would like to go back on long-acting injectable medications.  No involuntary movements.  After the above admission evaluation, it was determined based on her presenting symptoms that Lauren will benefit from mood stabilization treatments. The medication regimen for her presenting symptoms were discussed & initiated. She was medicated, stabilized & discharged on the medications as listed below. She was enrolled in the group counseling sessions being offered & held on this unit. She participated & learned coping skills. She presented other significant pres-existing medical issues that required treatment. She was resumed & discharged on all those pertinent home medications for those health issues. She tolerated her treatment regimen without any adverse effects or reactions reported.   And because Armanii has not been able to achieve symptoms control under an antipsychotic monotherapy, she is currently receiving and being discharged on 2 separate antipsychotic medications Abilify injectable & Risperdal tablets which seem effective in controlling her symptoms at this time. It will benefit patient to continue on these combination antipsychotic therapies as recommended. However, as her symptoms continue to improve, patient may then be titrated down to an antipsychotic monotherapy to avoid antipsychotic related metabolic syndrome. This has to be done within the discretion and proper judgement of her outpatient psychiatric provider.  Ande's symptoms responded well to her treatment regimen. This is evidenced by her reports of improved, presentation of good affect & eye contact. She is currently mentally & medically stable for discharge to continue Mental health care on an outpatient basis as noted below. She is provided with all the  necessary information needed to make this appointment without problems.  Today upon her discharge evaluation, pt has improvement of pressured speech, tangentiality and flight of ideas. She is calm, polite, pleasant, and cooperative with the interview. She shares, "I'm doing good." She denies any specific concerns. She is sleeping well. Her appetite is good. She denies other physical complaints. She denies SI/HI/AH/VH. She is tolerating her current treatment regimen well and she feels it has been helpful for slowing her flight of ideas. She is in agreement to follow up care on an outpatient basis as noted below. She was able to engage in safety planning including plan to return to [facility] or contact emergency services if she feels unable to maintain her own safety or the safety of others. Pt had no further questions, comments, or concerns.She left [facility] with all personal belongings in no apparent distress with all personal belongings.   Physical Findings: AIMS: Facial and Oral Movements  Muscles of Facial Expression: None, normal Lips and Perioral Area: None, normal Jaw: None, normal Tongue: None, normal,Extremity Movements Upper (arms, wrists, hands, fingers): None, normal Lower (legs, knees, ankles, toes): None, normal, Trunk Movements Neck, shoulders, hips: None, normal, Overall Severity Severity of abnormal movements (highest score from questions above): None, normal Incapacitation due to abnormal movements: None, normal [name] awareness of abnormal movements (rate only patient's report): No Awareness, Dental Status Current problems with teeth and/or dentures?: No Does patient usually wear dentures?: No  CIWA:  CIWA-Ar Total: 2 COWS:  COWS Total Score: 4  Musculoskeletal: Strength & Muscle Tone: within normal limits Gait & Station: normal Patient leans: N/A  Psychiatric Specialty Exam: See SRA by MD  Physical Exam  Nursing note and vitals reviewed. Constitutional: She is oriented to  person, place, and time. She appears well-developed.  HENT:  Head: Normocephalic.  Eyes: Pupils are equal, round, and reactive to light.  Neck: Normal range of motion.  Cardiovascular: Normal rate.  Respiratory: Effort normal.  GI: Soft.  Genitourinary:    Genitourinary Comments: Deferred   Musculoskeletal: Normal range of motion.  Neurological: She is alert and oriented to person, place, and time.  Skin: Skin is warm.  Psychiatric: She has a normal mood and affect. Her behavior is normal.    Review of Systems  Constitutional: Negative.   HENT: Negative.   Eyes: Negative.   Respiratory: Negative.  Negative for cough and shortness of breath.   Cardiovascular: Negative.  Negative for chest pain and palpitations.  Gastrointestinal: Negative.  Negative for abdominal pain, heartburn, nausea and vomiting.  Genitourinary: Negative.   Musculoskeletal: Negative.   Skin: Negative.   Neurological: Negative.  Negative for dizziness and headaches.  Endo/Heme/Allergies: Negative.   Psychiatric/Behavioral: Positive for depression (Stable) and hallucinations (Hx. psychosis (Stabilized with medications prior to discharge) ). Negative for memory loss (Improved), substance abuse and suicidal ideas. The patient has insomnia (Stabilized with medication prior to discharge). The patient is not nervous/anxious (Stable).   All other systems reviewed and are negative.   Blood pressure 123/90, pulse (!) 112, temperature 98 F (36.7 C), temperature source Oral, resp. rate 20, height 5\' 8"  (1.727 m), weight 97.1 kg.Body mass index is 32.54 kg/m.   Have you used any form of tobacco in the last 30 days? (Cigarettes, Smokeless Tobacco, Cigars, and/or Pipes): No  Has this patient used any form of tobacco in the last 30 days? (Cigarettes, Smokeless Tobacco, Cigars, and/or Pipes)  No  Blood Alcohol level:  Lab Results  Component Value Date   ETH <10 10/04/2018   ETH <10 41/93/7902   Metabolic Disorder Labs:   Lab Results  Component Value Date   HGBA1C 5.0 08/27/2018   MPG 96.8 08/27/2018   MPG 96.8 11/21/2017   Lab Results  Component Value Date   PROLACTIN 7.5 08/27/2018   PROLACTIN 5.4 11/21/2017   Lab Results  Component Value Date   CHOL 179 08/27/2018   TRIG 54 08/27/2018   HDL 57 08/27/2018   CHOLHDL 3.1 08/27/2018   VLDL 11 08/27/2018   LDLCALC 111 (H) 08/27/2018   LDLCALC 115 (H) 11/21/2017   See Psychiatric Specialty Exam and Suicide Risk Assessment completed by Attending Physician prior to discharge.  Discharge destination:  Home  Is patient on multiple antipsychotic therapies at discharge: Yes,    Do you recommend tapering to monotherapy for antipsychotics?  Yes    Has Patient had three or more failed trials of antipsychotic monotherapy by history:  Yes,  Antipsychotic medications that previously failed include:   1.  Haldol (due to allergic reaction), also failed Invega., 2.  Abilify. and 3.  Risperdal.  Recommended Plan for Multiple Antipsychotic Therapies: NA.  Allergies as of 10/08/2018      Reactions   Haldol [haloperidol Decanoate] Other (See Comments)   Stiffness, eyes bulging   Haloperidol    Penicillins Nausea And Vomiting   Has patient had a PCN reaction causing immediate rash, facial/tongue/throat swelling, SOB or lightheadedness with hypotension:UNSURE  Has patient had a PCN reaction causing severe rash involving mucus membranes or skin necrosis: UNSURE Has patient had a PCN reaction that required hospitalization:UNSURE Has patient had a PCN reaction occurring within the last 10 years:No If all of the above answers are "NO", then may proceed with Cephalosporin use. CHILDHOOD REACTION   Pollen Extract Other (See Comments)   Seasonal allergies   Shrimp [shellfish Allergy] Rash      Medication List    STOP taking these medications   ABILIFY MAINTENA 400 MG Prsy prefilled syringe Generic drug:  ARIPiprazole ER Replaced by:  ARIPiprazole ER 400 MG  Srer injection   fluPHENAZine 2.5 MG tablet Commonly known as:  PROLIXIN     TAKE these medications     Indication  albuterol 108 (90 Base) MCG/ACT inhaler Commonly known as:  PROVENTIL HFA;VENTOLIN HFA Inhale 2 puffs into the lungs every 6 (six) hours as needed for wheezing or shortness of breath.  Indication:  Asthma   ARIPiprazole ER 400 MG Srer injection Commonly known as:  ABILIFY MAINTENA Inject 2 mLs (400 mg total) into the muscle every 28 (twenty-eight) days. Due on 11-06-18 Start taking on:  November 06, 2018 Replaces:  ABILIFY MAINTENA 400 MG Prsy prefilled syringe  Indication:  Schizophrenia   benztropine 0.5 MG tablet Commonly known as:  COGENTIN Take 1 tablet (0.5 mg total) by mouth 2 (two) times daily. For prevention of drug induced tremors What changed:  additional instructions  Indication:  Extrapyramidal Reaction caused by Medications   budesonide-formoterol 160-4.5 MCG/ACT inhaler Commonly known as:  SYMBICORT Inhale 2 puffs into the lungs 2 (two) times daily. For Shortness of breath  Indication:  Asthma   divalproex 250 MG 24 hr tablet Commonly known as:  DEPAKOTE ER Take 3 tablets (750 mg total) by mouth at bedtime. For mood stabilization What changed:  You were already taking a medication with the same name, and this prescription was added. Make sure you understand how and when to take each.  Indication:  Mood stabilization   divalproex 500 MG 24 hr tablet Commonly known as:  DEPAKOTE ER Take 1 tablet (500 mg total) by mouth daily. For mood stabilization Start taking on:  October 09, 2018 What changed:    when to take this  additional instructions  Indication:  Mood stabilization   hydrOXYzine 25 MG tablet Commonly known as:  ATARAX/VISTARIL Take 1 tablet (25 mg total) by mouth every 6 (six) hours as needed for anxiety.  Indication:  Feeling Anxious   metroNIDAZOLE 500 MG tablet Commonly known as:  FLAGYL Take 1 tablet (500 mg total) by mouth  2 (two) times daily for 5 days. For infection What changed:  additional instructions  Indication:  Infection   risperiDONE 3 MG tablet Commonly known as:  RISPERDAL Take 1 tablet (3 mg total) by mouth 2 (two) times daily. For mood control  Indication:  Mood control   traZODone 100 MG tablet Commonly known as:  DESYREL Take 1 tablet (100  mg total) by mouth at bedtime as needed for sleep. What changed:    medication strength  how much to take  when to take this  reasons to take this  Indication:  [place], [facility] Follow up on 10/15/2018.   Specialty:  Family Medicine Why:  Your hospital follow up appointment is Tuesday, 2/18 at 10:45a. Please bring current medications and discharge paperwork from this hospitalization.  Contact information: [address] [place] [address] [phone]          Follow-up recommendations: Activity:  As tolerated Diet: As recommended by your primary care doctor. Keep all scheduled follow-up appointments as recommended.   Comments: Patient is instructed prior to discharge to: Take all medications as prescribed by his/her mental healthcare provider. Report any adverse effects and or reactions from the medicines to his/her outpatient provider promptly. Patient has been instructed & cautioned: To not engage in alcohol and or illegal drug use while on prescription medicines. In the event of worsening symptoms, patient is instructed to call the crisis hotline, 911 and or go to the nearest ED for appropriate evaluation and treatment of symptoms. To follow-up with his/her primary care provider for your other medical issues, concerns and or health care needs.     Signed: Lindell Spar, NP, PMHNP, FNP-BC 10/08/2018, 11:32 AM

## 2018-12-18 ENCOUNTER — Ambulatory Visit (HOSPITAL_COMMUNITY)
Admission: RE | Admit: 2018-12-18 | Discharge: 2018-12-18 | Disposition: A | Discharge status: Home / Self Care | Payer: Medicare HMO | Attending: Psychiatry | Admitting: Psychiatry

## 2018-12-18 DIAGNOSIS — R45851 Suicidal ideations: Secondary | ICD-10-CM | POA: Insufficient documentation

## 2018-12-18 DIAGNOSIS — Z87891 Personal history of nicotine dependence: Secondary | ICD-10-CM | POA: Insufficient documentation

## 2018-12-18 DIAGNOSIS — F250 Schizoaffective disorder, bipolar type: Secondary | ICD-10-CM | POA: Diagnosis present

## 2018-12-18 DIAGNOSIS — F4310 Post-traumatic stress disorder, unspecified: Secondary | ICD-10-CM | POA: Insufficient documentation

## 2018-12-18 DIAGNOSIS — G4700 Insomnia, unspecified: Secondary | ICD-10-CM | POA: Diagnosis not present

## 2018-12-19 ENCOUNTER — Inpatient Hospital Stay (HOSPITAL_COMMUNITY)
Admission: RE | Admit: 2018-12-19 | Discharge: 2018-12-24 | DRG: 885 | Disposition: A | Discharge status: Home / Self Care | Payer: Medicare HMO | Attending: Psychiatry | Admitting: Psychiatry

## 2018-12-19 ENCOUNTER — Inpatient Hospital Stay (HOSPITAL_COMMUNITY): Admission: AD | Admit: 2018-12-19 | Payer: Medicare HMO | Source: Intra-hospital | Admitting: Psychiatry

## 2018-12-19 ENCOUNTER — Other Ambulatory Visit: Payer: Self-pay

## 2018-12-19 ENCOUNTER — Encounter (HOSPITAL_COMMUNITY): Payer: Self-pay

## 2018-12-19 DIAGNOSIS — F250 Schizoaffective disorder, bipolar type: Secondary | ICD-10-CM | POA: Diagnosis not present

## 2018-12-19 DIAGNOSIS — Z888 Allergy status to other drugs, medicaments and biological substances status: Secondary | ICD-10-CM

## 2018-12-19 DIAGNOSIS — G4700 Insomnia, unspecified: Secondary | ICD-10-CM | POA: Diagnosis present

## 2018-12-19 DIAGNOSIS — I10 Essential (primary) hypertension: Secondary | ICD-10-CM | POA: Diagnosis present

## 2018-12-19 DIAGNOSIS — Z91048 Other nonmedicinal substance allergy status: Secondary | ICD-10-CM

## 2018-12-19 DIAGNOSIS — Z7951 Long term (current) use of inhaled steroids: Secondary | ICD-10-CM

## 2018-12-19 DIAGNOSIS — R569 Unspecified convulsions: Secondary | ICD-10-CM | POA: Diagnosis present

## 2018-12-19 DIAGNOSIS — Z818 Family history of other mental and behavioral disorders: Secondary | ICD-10-CM

## 2018-12-19 DIAGNOSIS — Z8249 Family history of ischemic heart disease and other diseases of the circulatory system: Secondary | ICD-10-CM

## 2018-12-19 DIAGNOSIS — F419 Anxiety disorder, unspecified: Secondary | ICD-10-CM | POA: Diagnosis present

## 2018-12-19 DIAGNOSIS — Z91013 Allergy to seafood: Secondary | ICD-10-CM

## 2018-12-19 DIAGNOSIS — Z853 Personal history of malignant neoplasm of breast: Secondary | ICD-10-CM

## 2018-12-19 DIAGNOSIS — Z880 Allergy status to penicillin: Secondary | ICD-10-CM

## 2018-12-19 DIAGNOSIS — Z79899 Other long term (current) drug therapy: Secondary | ICD-10-CM

## 2018-12-19 DIAGNOSIS — E119 Type 2 diabetes mellitus without complications: Secondary | ICD-10-CM | POA: Diagnosis present

## 2018-12-19 DIAGNOSIS — J45909 Unspecified asthma, uncomplicated: Secondary | ICD-10-CM | POA: Diagnosis present

## 2018-12-19 LAB — COMPREHENSIVE METABOLIC PANEL
ALT: 17 U/L (ref 0–44)
AST: 17 U/L (ref 15–41)
Albumin: 3.6 g/dL (ref 3.5–5.0)
Alkaline Phosphatase: 41 U/L (ref 38–126)
Anion gap: 7 (ref 5–15)
BUN: 8 mg/dL (ref 6–20)
CO2: 24 mmol/L (ref 22–32)
Calcium: 9 mg/dL (ref 8.9–10.3)
Chloride: 108 mmol/L (ref 98–111)
Creatinine, Ser: 0.6 mg/dL (ref 0.44–1.00)
GFR calc Af Amer: >60 mL/min (ref >60)
GFR calc non Af Amer: >60 mL/min (ref >60)
Glucose, Bld: 96 mg/dL (ref 70–99)
Potassium: 3.9 mmol/L (ref 3.5–5.1)
Sodium: 139 mmol/L (ref 135–145)
Total Bilirubin: 0.5 mg/dL (ref 0.3–1.2)
Total Protein: 6.6 g/dL (ref 6.5–8.1)

## 2018-12-19 LAB — LIPID PANEL
Cholesterol: 161 mg/dL (ref 0–200)
HDL: 53 mg/dL (ref >40)
LDL Cholesterol: 97 mg/dL (ref 0–99)
Total CHOL/HDL Ratio: 3 RATIO
Triglycerides: 55 mg/dL (ref <150)
VLDL: 11 mg/dL (ref 0–40)

## 2018-12-19 LAB — RAPID URINE DRUG SCREEN, HOSP PERFORMED
Amphetamines: NOT DETECTED
Barbiturates: NOT DETECTED
Benzodiazepines: NOT DETECTED
Cocaine: NOT DETECTED
Opiates: NOT DETECTED
Tetrahydrocannabinol: NOT DETECTED

## 2018-12-19 LAB — URINALYSIS, COMPLETE (UACMP) WITH MICROSCOPIC
Bilirubin Urine: NEGATIVE
Glucose, UA: NEGATIVE mg/dL
Hgb urine dipstick: NEGATIVE
Ketones, ur: NEGATIVE mg/dL
Leukocytes,Ua: NEGATIVE
Nitrite: NEGATIVE
Protein, ur: NEGATIVE mg/dL
Specific Gravity, Urine: 1.016 (ref 1.005–1.030)
pH: 5 (ref 5.0–8.0)

## 2018-12-19 LAB — CBC
HCT: 37 % (ref 36.0–46.0)
Hemoglobin: 11.7 g/dL — ABNORMAL LOW (ref 12.0–15.0)
MCH: 29.8 pg (ref 26.0–34.0)
MCHC: 31.6 g/dL (ref 30.0–36.0)
MCV: 94.4 fL (ref 80.0–100.0)
Platelets: 214 10*3/uL (ref 150–400)
RBC: 3.92 MIL/uL (ref 3.87–5.11)
RDW: 13.8 % (ref 11.5–15.5)
WBC: 4.7 10*3/uL (ref 4.0–10.5)
nRBC: 0 % (ref 0.0–0.2)

## 2018-12-19 LAB — TSH: TSH: 0.91 u[IU]/mL (ref 0.350–4.500)

## 2018-12-19 MED ORDER — RISPERIDONE 2 MG PO TBDP: 2.0000 mg | ORAL_TABLET | Freq: Three times a day (TID) | ORAL | Status: DC | PRN

## 2018-12-19 MED ORDER — ARIPIPRAZOLE ER 400 MG IM SRER
400.0000 mg | INTRAMUSCULAR | Status: DC
  Administered 2018-12-19: 400 mg via INTRAMUSCULAR

## 2018-12-19 MED ORDER — RISPERIDONE 3 MG PO TABS
3.0000 mg | ORAL_TABLET | Freq: Two times a day (BID) | ORAL | Status: DC
  Administered 2018-12-19 – 2018-12-22 (×6): 3 mg via ORAL
  Filled 2018-12-19 (×9): qty 1

## 2018-12-19 MED ORDER — TRAZODONE HCL 100 MG PO TABS
100.0000 mg | ORAL_TABLET | Freq: Every evening | ORAL | Status: DC | PRN
  Administered 2018-12-20 – 2018-12-23 (×4): 100 mg via ORAL
  Filled 2018-12-19 (×2): qty 1
  Filled 2018-12-19: qty 7
  Filled 2018-12-19: qty 1

## 2018-12-19 MED ORDER — LORAZEPAM 1 MG PO TABS
1.0000 mg | ORAL_TABLET | ORAL | Status: AC | PRN
  Administered 2018-12-23: 1 mg via ORAL
  Filled 2018-12-19: qty 1

## 2018-12-19 MED ORDER — HYDROXYZINE HCL 25 MG PO TABS: 25.0000 mg | ORAL_TABLET | Freq: Four times a day (QID) | ORAL | Status: DC | PRN

## 2018-12-19 MED ORDER — LORAZEPAM 1 MG PO TABS
1.0000 mg | ORAL_TABLET | ORAL | Status: AC | PRN
  Administered 2018-12-24: 1 mg via ORAL
  Filled 2018-12-19: qty 1

## 2018-12-19 MED ORDER — HYDROXYZINE HCL 50 MG PO TABS
50.0000 mg | ORAL_TABLET | Freq: Three times a day (TID) | ORAL | Status: DC | PRN
  Administered 2018-12-21 – 2018-12-24 (×6): 50 mg via ORAL
  Filled 2018-12-19 (×6): qty 1

## 2018-12-19 MED ORDER — TRAZODONE HCL 100 MG PO TABS
100.0000 mg | ORAL_TABLET | Freq: Every evening | ORAL | Status: DC | PRN
  Filled 2018-12-19 (×2): qty 1

## 2018-12-19 MED ORDER — ZIPRASIDONE MESYLATE 20 MG IM SOLR: 20.0000 mg | INTRAMUSCULAR | Status: DC | PRN

## 2018-12-19 MED ORDER — ACETAMINOPHEN 325 MG PO TABS: 650.0000 mg | ORAL_TABLET | Freq: Four times a day (QID) | ORAL | Status: DC | PRN

## 2018-12-19 MED ORDER — DIVALPROEX SODIUM 500 MG PO DR TAB
500.0000 mg | DELAYED_RELEASE_TABLET | Freq: Every day | ORAL | Status: DC
  Administered 2018-12-19 – 2018-12-22 (×4): 500 mg via ORAL
  Filled 2018-12-19 (×6): qty 1

## 2018-12-19 MED ORDER — ALBUTEROL SULFATE HFA 108 (90 BASE) MCG/ACT IN AERS
1.0000 | INHALATION_SPRAY | RESPIRATORY_TRACT | Status: DC | PRN
  Administered 2018-12-20: 1 via RESPIRATORY_TRACT
  Administered 2018-12-24: 2 via RESPIRATORY_TRACT
  Filled 2018-12-19: qty 6.7

## 2018-12-19 MED ORDER — MAGNESIUM HYDROXIDE 400 MG/5ML PO SUSP: 30.0000 mL | Freq: Every day | ORAL | Status: DC | PRN

## 2018-12-19 MED ORDER — ALUM & MAG HYDROXIDE-SIMETH 200-200-20 MG/5ML PO SUSP: 30.0000 mL | ORAL | Status: DC | PRN

## 2018-12-19 MED ORDER — ZIPRASIDONE MESYLATE 20 MG IM SOLR: 20.0000 mg | Freq: Two times a day (BID) | INTRAMUSCULAR | Status: DC | PRN

## 2018-12-19 MED ORDER — BENZTROPINE MESYLATE 0.5 MG PO TABS
0.5000 mg | ORAL_TABLET | Freq: Two times a day (BID) | ORAL | Status: DC
  Administered 2018-12-19 – 2018-12-24 (×10): 0.5 mg via ORAL
  Filled 2018-12-19 (×15): qty 1

## 2018-12-19 MED ORDER — DIVALPROEX SODIUM 250 MG PO DR TAB
750.0000 mg | DELAYED_RELEASE_TABLET | Freq: Every day | ORAL | Status: DC
  Administered 2018-12-20 – 2018-12-21 (×2): 750 mg via ORAL
  Filled 2018-12-19 (×4): qty 3

## 2018-12-19 NOTE — Plan of Care (Signed)
[place] Observation Crisis Plan  Reason for Crisis Plan:  Crisis Stabilization   Plan of Care:  Referral for Inpatient Hospitalization  Family Support:      Current Living Environment:  Living Arrangements: Alone  Insurance:   Hospital Account    Name Acct ID Class Status Primary Coverage   [name], [name] [id number] [place] [employer]        Guarantor Account (for Hospital Account [id number])    Name Relation to Pt Service Area Active? Acct Type   [name] Self [facility]SA Yes [facility]   Address Phone       [address] [place] [address] [place], [place] [address] [phone])          Coverage Information (for Hospital Account [id number])    1. [facility] [place] MEDICARE HMO    F/O Payor/Plan Precert #   [facility] Subscriber #   [name] [id number]   Address Phone   [place] [address] [phone]       2. [place]    F/[place] Payor/Plan Precert #   [place]    Subscriber Subscriber #   [name] [id number]   Address Phone   [address] [place],  [address] [phone]          Legal Guardian:     Primary Care Provider:  Rosita Fire, MD  Current Outpatient Providers:  Beverly Sessions  Psychiatrist:     Counselor/Therapist:     Compliant with Medications:  Yes  Additional Information:   [name] 4/23/20201:25 AM

## 2018-12-19 NOTE — Progress Notes (Signed)
Patient ID: [name], female   DOB: September 18, 1970, 48 y.o.   MRN: [id number]   [name] is a 48 year old readmission that was a walk-in tonight. Some type of worker with a badge brought her in but left before assessed. She reported that she called the police because she felt like somebody was "going to get her". Made some paranoid statements about feeling someone was after her but did not name anyone. Conversation with loose associations and flight of ideas and hard to follow at times. Thinking disorganized but patient pleasant. Seen talking to self when eating her tray. Denies any active suicidal plans. She has a hx of Schizophrenia, Bipolar, Anemia, Anxiety, Breast cancer, DM, HTN, and seizures. Has been on Abilify Injection in community. Cooperative with process on OBS unit.
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
## 2018-12-19 NOTE — H&P (Signed)
[facility] Observation Unit Provider Admission PAA/H&P  Patient Identification: [name] MRN:  [id number] Date of Evaluation:  12/19/2018 Chief Complaint:  schizoeffective bipolar type Principal Diagnosis: <principal problem not specified> Diagnosis:  Active Problems:   Schizoaffective disorder, bipolar type ([place])  History of Present Illness: Is a 48 y/o looking older than stated age AAF, known to our service with hx of schizophrenia, Bipolar d/o, GAD presenting with both disorganized speech and thought, tangential, inattentive and inappropriate. She is endorsing fear, but denies any self harm, self injurious behaviors, SI/SA or HI. Dur to profound neurocognitive retardation and delusional thought pattern further subjective information is not obtainable per patient. Associated Signs/Symptoms: Depression Symptoms:  difficulty concentrating, (Hypo) Manic Symptoms:  Delusions, Distractibility, Anxiety Symptoms:  Excessive Worry, Psychotic Symptoms:  Delusions, PTSD Symptoms: NA Total Time spent with patient: 20 minutes  Past Psychiatric History:Schizophrenia, Bipolar d/o, GAD   Is the patient at risk to self? No.  Has the patient been a risk to self in the past 6 months? No.  Has the patient been a risk to self within the distant past? No.  Is the patient a risk to others? No.  Has the patient been a risk to others in the past 6 months? No.  Has the patient been a risk to others within the distant past? No.   Prior Inpatient Therapy:   Prior Outpatient Therapy:    Alcohol Screening:   Substance Abuse History in the last 12 months:  No. Consequences of Substance Abuse: NA Previous Psychotropic Medications: Yes  Psychological Evaluations: Yes  Past Medical History:  Past Medical History:  Diagnosis Date  . Anemia   . Anxiety   . Asthma   . Bipolar 1 disorder ([place])   . Breast cancer ([place])   . Depression   . Diabetes mellitus without complication ([place])   . Hypertension   .  Insomnia, persistent   . Schizophrenic disorder ([place])   . Seizures ([place])     Past Surgical History:  Procedure Laterality Date  . BREAST SURGERY Left   . TONSILLECTOMY     Family History:  Family History  Problem Relation Age of Onset  . Depression Mother   . Gout Mother   . Cancer Father        prostate  . Other Father        lung issue  . Alcoholism Other   . Heart attack Paternal Grandfather   . Heart attack Paternal Grandmother   . Heart attack Maternal Grandmother   . Heart attack Maternal Grandfather   . Depression Son   . Anxiety disorder Son    Family Psychiatric History: Son- MDD, GAD Tobacco Screening:  negative Social History:  Social History   Substance and Sexual Activity  Alcohol Use Yes   Comment: BAC not available at time of assessment     Social History   Substance and Sexual Activity  Drug Use No    Additional Social History:                           Allergies:   Allergies  Allergen Reactions  . Haldol [Haloperidol Decanoate] Other (See Comments)    Stiffness, eyes bulging  . Haloperidol   . Penicillins Nausea And Vomiting    Has patient had a PCN reaction causing immediate rash, facial/tongue/throat swelling, SOB or lightheadedness with hypotension:UNSURE  Has patient had a PCN reaction causing severe rash involving mucus membranes or skin necrosis:  UNSURE Has patient had a PCN reaction that required hospitalization:UNSURE Has patient had a PCN reaction occurring within the last 10 years:No If all of the above answers are "NO", then may proceed with Cephalosporin use. CHILDHOOD REACTION  . Pollen Extract Other (See Comments)    Seasonal allergies  . Shrimp [Shellfish Allergy] Rash   Lab Results: No results found for this or any previous visit (from the past 48 hour(s)).  Blood Alcohol level:  Lab Results  Component Value Date   ETH <10 10/04/2018   ETH <10 93/57/0177    Metabolic Disorder Labs:  Lab Results   Component Value Date   HGBA1C 5.0 08/27/2018   MPG 96.8 08/27/2018   MPG 96.8 11/21/2017   Lab Results  Component Value Date   PROLACTIN 7.5 08/27/2018   PROLACTIN 5.4 11/21/2017   Lab Results  Component Value Date   CHOL 179 08/27/2018   TRIG 54 08/27/2018   HDL 57 08/27/2018   CHOLHDL 3.1 08/27/2018   VLDL 11 08/27/2018   LDLCALC 111 (H) 08/27/2018   LDLCALC 115 (H) 11/21/2017    Current Medications: Current Facility-Administered Medications  Medication Dose Route Frequency Provider Last Rate Last Dose  . acetaminophen (TYLENOL) tablet 650 mg  650 mg Oral Q6H PRN Laverle Hobby, PA-C      . alum & mag hydroxide-simeth (MAALOX/MYLANTA) 200-200-20 MG/5ML suspension 30 mL  30 mL Oral Q4H PRN Patriciaann Clan E, PA-C      . hydrOXYzine (ATARAX/VISTARIL) tablet 25 mg  25 mg Oral Q6H PRN Patriciaann Clan E, PA-C      . risperiDONE (RISPERDAL M-TABS) disintegrating tablet 2 mg  2 mg Oral Q8H PRN Laverle Hobby, PA-C       And  . LORazepam (ATIVAN) tablet 1 mg  1 mg Oral PRN Patriciaann Clan E, PA-C       And  . ziprasidone (GEODON) injection 20 mg  20 mg Intramuscular PRN Patriciaann Clan E, PA-C      . magnesium hydroxide (MILK OF MAGNESIA) suspension 30 mL  30 mL Oral Daily PRN Laverle Hobby, PA-C      . traZODone (DESYREL) tablet 100 mg  100 mg Oral QHS,MR X 1 Hanna Aultman E, PA-C       PTA Medications: Medications Prior to Admission  Medication Sig Dispense Refill Last Dose  . albuterol (PROVENTIL HFA;VENTOLIN HFA) 108 (90 Base) MCG/ACT inhaler Inhale 2 puffs into the lungs every 6 (six) hours as needed for wheezing or shortness of breath. 1 Inhaler 0   . ARIPiprazole ER (ABILIFY MAINTENA) 400 MG SRER injection Inject 2 mLs (400 mg total) into the muscle every 28 (twenty-eight) days. Due on 11-06-18 1 each 0   . benztropine (COGENTIN) 0.5 MG tablet Take 1 tablet (0.5 mg total) by mouth 2 (two) times daily. For prevention of drug induced tremors 60 tablet 0   .  budesonide-formoterol (SYMBICORT) 160-4.5 MCG/ACT inhaler Inhale 2 puffs into the lungs 2 (two) times daily. For Shortness of breath 1 Inhaler 0   . divalproex (DEPAKOTE ER) 250 MG 24 hr tablet Take 3 tablets (750 mg total) by mouth at bedtime. For mood stabilization 90 tablet 0   . divalproex (DEPAKOTE ER) 500 MG 24 hr tablet Take 1 tablet (500 mg total) by mouth daily. For mood stabilization 30 tablet 0   . hydrOXYzine (ATARAX/VISTARIL) 25 MG tablet Take 1 tablet (25 mg total) by mouth every 6 (six) hours as needed for anxiety. 60 tablet 0   .  risperiDONE (RISPERDAL) 3 MG tablet Take 1 tablet (3 mg total) by mouth 2 (two) times daily. For mood control 60 tablet 0   . traZODone (DESYREL) 100 MG tablet Take 1 tablet (100 mg total) by mouth at bedtime as needed for sleep. 30 tablet 0     Musculoskeletal: Strength & Muscle Tone: within normal limits Gait & Station: normal Patient leans: N/A  Psychiatric Specialty Exam: Physical Exam  Constitutional: She is oriented to person, place, and time. She appears well-developed and well-nourished. No distress.  HENT:  Head: Normocephalic.  Eyes: Pupils are equal, round, and reactive to light.  Respiratory: Effort normal and breath sounds normal. No respiratory distress.  Neurological: She is alert and oriented to person, place, and time.  Skin: Skin is warm and dry. She is not diaphoretic.  Psychiatric: Her affect is inappropriate. She is actively hallucinating. Thought content is delusional. Cognition and memory are impaired. She expresses inappropriate judgment. She is inattentive.    Review of Systems  Constitutional: Negative.  Negative for chills, diaphoresis, fever, malaise/fatigue and weight loss.  Psychiatric/Behavioral: Positive for hallucinations. Negative for depression, memory loss, substance abuse and suicidal ideas. The patient is nervous/anxious and has insomnia.     There were no vitals taken for this visit.There is no height or  weight on file to calculate BMI.  General Appearance: Casual  Eye Contact:  Fair  Speech:  Blocked  Volume:  Normal  Mood:  Anxious  Affect:  Full Range  Thought Process:  Disorganized  Orientation:  NA  Thought Content:  Delusions  Suicidal Thoughts:  No  Homicidal Thoughts:  No  Memory:  Immediate;   Poor  Judgement:  Impaired  Insight:  Lacking  Psychomotor Activity:  Normal  Concentration:  Concentration: Poor  Recall:  Poor  Fund of Knowledge:  Poor  Language:  Fair  Akathisia:  Negative  Handed:  Right  AIMS (if indicated):     Assets:  Desire for Improvement  ADL's:  Intact  Cognition:  Impaired,  Moderate  Sleep:         Treatment Plan Summary: Daily contact with patient to assess and evaluate symptoms and progress in treatment  Observation Level/Precautions:  Continuous Observation Laboratory:  Chemistry Profile Psychotherapy:   Medications:   Consultations:   Discharge Concerns:   Estimated LOS: Other:      Laverle Hobby, PA-C 4/23/202012:29 AM
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
## 2018-12-19 NOTE — BH Assessment (Signed)
[place] Assessment Progress Note   Pt to be transferred from OBS to [facility] 503-1 to services of Dr. Jake Samples.  Daytime AC to coordinate transfer.

## 2018-12-20 ENCOUNTER — Encounter (HOSPITAL_COMMUNITY): Payer: Self-pay | Admitting: Emergency Medicine

## 2018-12-20 DIAGNOSIS — Z888 Allergy status to other drugs, medicaments and biological substances status: Secondary | ICD-10-CM | POA: Diagnosis not present

## 2018-12-20 DIAGNOSIS — Z7951 Long term (current) use of inhaled steroids: Secondary | ICD-10-CM | POA: Diagnosis not present

## 2018-12-20 DIAGNOSIS — J45909 Unspecified asthma, uncomplicated: Secondary | ICD-10-CM | POA: Diagnosis present

## 2018-12-20 DIAGNOSIS — Z818 Family history of other mental and behavioral disorders: Secondary | ICD-10-CM | POA: Diagnosis not present

## 2018-12-20 DIAGNOSIS — G4700 Insomnia, unspecified: Secondary | ICD-10-CM | POA: Diagnosis present

## 2018-12-20 DIAGNOSIS — F250 Schizoaffective disorder, bipolar type: Secondary | ICD-10-CM | POA: Diagnosis present

## 2018-12-20 DIAGNOSIS — Z880 Allergy status to penicillin: Secondary | ICD-10-CM | POA: Diagnosis not present

## 2018-12-20 DIAGNOSIS — Z8249 Family history of ischemic heart disease and other diseases of the circulatory system: Secondary | ICD-10-CM | POA: Diagnosis not present

## 2018-12-20 DIAGNOSIS — Z91048 Other nonmedicinal substance allergy status: Secondary | ICD-10-CM | POA: Diagnosis not present

## 2018-12-20 DIAGNOSIS — F419 Anxiety disorder, unspecified: Secondary | ICD-10-CM | POA: Diagnosis present

## 2018-12-20 DIAGNOSIS — I10 Essential (primary) hypertension: Secondary | ICD-10-CM | POA: Diagnosis present

## 2018-12-20 DIAGNOSIS — Z91013 Allergy to seafood: Secondary | ICD-10-CM | POA: Diagnosis not present

## 2018-12-20 DIAGNOSIS — R569 Unspecified convulsions: Secondary | ICD-10-CM | POA: Diagnosis present

## 2018-12-20 DIAGNOSIS — E119 Type 2 diabetes mellitus without complications: Secondary | ICD-10-CM | POA: Diagnosis present

## 2018-12-20 DIAGNOSIS — Z79899 Other long term (current) drug therapy: Secondary | ICD-10-CM | POA: Diagnosis not present

## 2018-12-20 DIAGNOSIS — Z853 Personal history of malignant neoplasm of breast: Secondary | ICD-10-CM | POA: Diagnosis not present

## 2018-12-20 LAB — PROLACTIN: Prolactin: 4.2 ng/mL — ABNORMAL LOW (ref 4.8–23.3)

## 2018-12-20 MED ORDER — MAGNESIUM HYDROXIDE 400 MG/5ML PO SUSP: 30.0000 mL | Freq: Every day | ORAL | Status: DC | PRN

## 2018-12-20 MED ORDER — ACETAMINOPHEN 325 MG PO TABS: 650.0000 mg | ORAL_TABLET | Freq: Four times a day (QID) | ORAL | Status: DC | PRN

## 2018-12-20 MED ORDER — ALUM & MAG HYDROXIDE-SIMETH 200-200-20 MG/5ML PO SUSP: 30.0000 mL | ORAL | Status: DC | PRN

## 2018-12-20 MED ORDER — HYDROXYZINE HCL 25 MG PO TABS
25.0000 mg | ORAL_TABLET | Freq: Four times a day (QID) | ORAL | Status: DC | PRN
  Administered 2018-12-20 – 2018-12-21 (×2): 25 mg via ORAL
  Filled 2018-12-20 (×2): qty 1

## 2018-12-20 MED ORDER — TRAZODONE HCL 50 MG PO TABS
50.0000 mg | ORAL_TABLET | Freq: Every evening | ORAL | Status: DC | PRN
  Administered 2018-12-21 – 2018-12-23 (×3): 50 mg via ORAL
  Filled 2018-12-20 (×14): qty 1

## 2018-12-20 NOTE — H&P (Signed)
Psychiatric Admission Assessment Adult  Patient Identification: [name] MRN:  [id number] Date of Evaluation:  12/20/2018 Chief Complaint:  SCHIZOAFFECTIVE DISORDER Principal Diagnosis: Exacerbation of underlying psychotic disorder Diagnosis:  Active Problems:   Schizoaffective disorder, bipolar type ([place])  History of Present Illness:  This is a repeat admission for [name] 48 years of age and she really presented on 1/85 phoning the police for help she as usual was very rambling she tells me that she is separated from her husband that there were certain triggers that led her to have suicidal thoughts but again she tells me she is not suicidal now she rambles on about numerous unrelated topics is very hard to get a meaningful history other than the meaning being her thoughts are disorganized and tangential with very loose associations.  She is not been sleeping she apparently is been going to [facility] for outpatient therapy was last with [place] in February and discharged on the seventh that month.  She reports she is compliant with medications but we are not sure if that is accurate She cannot state how long the symptoms have been going on Psychotic symptoms are severe as far as mental disorganization Screen negative at this point time Associated Signs/Symptoms: Depression Symptoms:  psychomotor agitation, (Hypo) Manic Symptoms:  Distractibility, Flight of Ideas, Anxiety Symptoms:  Social Anxiety, Psychotic Symptoms:  Disorganized thought PTSD Symptoms: NA Total Time spent with patient: 45 minutes  Past Psychiatric History: See old chart  Is the patient at risk to self? Yes.    Has the patient been a risk to self in the past 6 months? Yes.    Has the patient been a risk to self within the distant past? Yes.    Is the patient a risk to others? No.  Has the patient been a risk to others in the past 6 months? No.  Has the patient been a risk to others within the distant past? No.   Alcohol Screening:   Substance Abuse History in the last 12 months:  No. Consequences of Substance Abuse: Medical Consequences:  n/a Previous Psychotropic Medications: Yes  Psychological Evaluations: No  Past Medical History:  Past Medical History:  Diagnosis Date  . Anemia   . Anxiety   . Asthma   . Bipolar 1 disorder ([place])   . Breast cancer ([place])   . Depression   . Diabetes mellitus without complication ([place])   . Hypertension   . Insomnia, persistent   . Schizophrenic disorder ([place])   . Seizures ([place])     Past Surgical History:  Procedure Laterality Date  . BREAST SURGERY Left   . TONSILLECTOMY     Family History:  Family History  Problem Relation Age of Onset  . Depression Mother   . Gout Mother   . Cancer Father        prostate  . Other Father        lung issue  . Alcoholism Other   . Heart attack Paternal Grandfather   . Heart attack Paternal Grandmother   . Heart attack Maternal Grandmother   . Heart attack Maternal Grandfather   . Depression Son   . Anxiety disorder Son   Tobacco Screening:   Social History:  Social History   Substance and Sexual Activity  Alcohol Use Yes   Comment: BAC not available at time of assessment     Social History   Substance and Sexual Activity  Drug Use No    Additional Social History:  Allergies:   Allergies  Allergen Reactions  . Haldol [Haloperidol Decanoate] Other (See Comments)    Stiffness, eyes bulging  . Haloperidol   . Penicillins Nausea And Vomiting    Has patient had a PCN reaction causing immediate rash, facial/tongue/throat swelling, SOB or lightheadedness with hypotension:UNSURE  Has patient had a PCN reaction causing severe rash involving mucus membranes or skin necrosis: UNSURE Has patient had a PCN reaction that required hospitalization:UNSURE Has patient had a PCN reaction occurring within the last 10 years:No If all of the above answers are "NO", then may  proceed with Cephalosporin use. CHILDHOOD REACTION  . Pollen Extract Other (See Comments)    Seasonal allergies  . Shrimp [Shellfish Allergy] Rash   Lab Results:  Results for orders placed or performed during the hospital encounter of 12/19/18 (from the past 48 hour(s))  Rapid urine drug screen (hospital performed)     Status: None   Collection Time: 12/19/18  9:54 AM  Result Value Ref Range   Opiates NONE DETECTED NONE DETECTED   Cocaine NONE DETECTED NONE DETECTED   Benzodiazepines NONE DETECTED NONE DETECTED   Amphetamines NONE DETECTED NONE DETECTED   Tetrahydrocannabinol NONE DETECTED NONE DETECTED   Barbiturates NONE DETECTED NONE DETECTED    Comment: (NOTE) DRUG SCREEN FOR MEDICAL PURPOSES ONLY.  IF CONFIRMATION IS NEEDED FOR ANY PURPOSE, NOTIFY LAB WITHIN 5 DAYS. LOWEST DETECTABLE LIMITS FOR URINE DRUG SCREEN Drug Class                     Cutoff (ng/mL) Amphetamine and metabolites    1000 Barbiturate and metabolites    200 Benzodiazepine                 902 Tricyclics and metabolites     300 Opiates and metabolites        300 Cocaine and metabolites        300 THC                            50 Performed at [facility], [place] [address], [place], [place] [address]   Urinalysis, Complete w Microscopic     Status: Abnormal   Collection Time: 12/19/18  9:54 AM  Result Value Ref Range   Color, Urine YELLOW YELLOW   APPearance CLEAR CLEAR   Specific Gravity, Urine 1.016 1.005 - 1.030   pH 5.0 5.0 - 8.0   Glucose, UA NEGATIVE NEGATIVE mg/dL   Hgb urine dipstick NEGATIVE NEGATIVE   Bilirubin Urine NEGATIVE NEGATIVE   Ketones, ur NEGATIVE NEGATIVE mg/dL   Protein, ur NEGATIVE NEGATIVE mg/dL   Nitrite NEGATIVE NEGATIVE   Leukocytes,Ua NEGATIVE NEGATIVE   RBC / HPF 0-5 0 - 5 RBC/hpf   WBC, UA 0-5 0 - 5 WBC/hpf   Bacteria, UA RARE (A) NONE SEEN   Squamous Epithelial / LPF 0-5 0 - 5   Mucus PRESENT     Comment: Performed at [facility], [place] [address], [place], [place] [address]    Blood Alcohol level:  Lab Results  Component Value Date   [facility] <10 10/04/2018   ETH <10 24/26/8341    Metabolic Disorder Labs:  Lab Results  Component Value Date   HGBA1C 5.0 08/27/2018   MPG 96.8 08/27/2018   MPG 96.8 11/21/2017   Lab Results  Component Value Date   PROLACTIN 4.2 (L) 12/19/2018   PROLACTIN 7.5 08/27/2018  Lab Results  Component Value Date   CHOL 161 12/19/2018   TRIG 55 12/19/2018   HDL 53 12/19/2018   CHOLHDL 3.0 12/19/2018   VLDL 11 12/19/2018   LDLCALC 97 12/19/2018   LDLCALC 111 (H) 08/27/2018    Current Medications: Current Facility-Administered Medications  Medication Dose Route Frequency Provider Last Rate Last Dose  . acetaminophen (TYLENOL) tablet 650 mg  650 mg Oral Q6H PRN Laverle Hobby, PA-C      . albuterol (VENTOLIN HFA) 108 (90 Base) MCG/ACT inhaler 1-2 puff  1-2 puff Inhalation Q4H PRN Ethelene Hal, NP      . alum & mag hydroxide-simeth (MAALOX/MYLANTA) 200-200-20 MG/5ML suspension 30 mL  30 mL Oral Q4H PRN Laverle Hobby, PA-C      . ARIPiprazole ER (ABILIFY MAINTENA) injection 400 mg  400 mg Intramuscular Q28 days Ethelene Hal, NP   400 mg at 12/19/18 1138  . benztropine (COGENTIN) tablet 0.5 mg  0.5 mg Oral BID Ethelene Hal, NP   0.5 mg at 12/20/18 1941  . divalproex (DEPAKOTE) DR tablet 500 mg  500 mg Oral Daily Ethelene Hal, NP   500 mg at 12/20/18 7408   And  . divalproex (DEPAKOTE) DR tablet 750 mg  750 mg Oral QHS Ethelene Hal, NP      . hydrOXYzine (ATARAX/VISTARIL) tablet 25 mg  25 mg Oral Q6H PRN Laverle Hobby, PA-C      . hydrOXYzine (ATARAX/VISTARIL) tablet 50 mg  50 mg Oral TID PRN Ethelene Hal, NP      . risperiDONE (RISPERDAL M-TABS) disintegrating tablet 2 mg  2 mg Oral Q8H PRN Laverle Hobby, PA-C       And  . LORazepam (ATIVAN) tablet 1 mg  1 mg Oral PRN Laverle Hobby, PA-C       And  .  ziprasidone (GEODON) injection 20 mg  20 mg Intramuscular PRN Patriciaann Clan E, PA-C      . ziprasidone (GEODON) injection 20 mg  20 mg Intramuscular Q12H PRN Ethelene Hal, NP       And  . LORazepam (ATIVAN) tablet 1 mg  1 mg Oral PRN Ethelene Hal, NP      . magnesium hydroxide (MILK OF MAGNESIA) suspension 30 mL  30 mL Oral Daily PRN Laverle Hobby, PA-C      . risperiDONE (RISPERDAL) tablet 3 mg  3 mg Oral BID Ethelene Hal, NP   3 mg at 12/20/18 0906  . traZODone (DESYREL) tablet 100 mg  100 mg Oral QHS,MR X 1 Simon, Spencer E, PA-C      . traZODone (DESYREL) tablet 100 mg  100 mg Oral QHS PRN Ethelene Hal, NP       PTA Medications: Medications Prior to Admission  Medication Sig Dispense Refill Last Dose  . albuterol (PROVENTIL HFA;VENTOLIN HFA) 108 (90 Base) MCG/ACT inhaler Inhale 2 puffs into the lungs every 6 (six) hours as needed for wheezing or shortness of breath. 1 Inhaler 0   . ARIPiprazole ER (ABILIFY MAINTENA) 400 MG SRER injection Inject 2 mLs (400 mg total) into the muscle every 28 (twenty-eight) days. Due on 11-06-18 1 each 0   . benztropine (COGENTIN) 0.5 MG tablet Take 1 tablet (0.5 mg total) by mouth 2 (two) times daily. For prevention of drug induced tremors 60 tablet 0   . budesonide-formoterol (SYMBICORT) 160-4.5 MCG/ACT inhaler Inhale 2 puffs into the lungs 2 (two) times daily.  For Shortness of breath 1 Inhaler 0   . divalproex (DEPAKOTE ER) 250 MG 24 hr tablet Take 3 tablets (750 mg total) by mouth at bedtime. For mood stabilization 90 tablet 0   . divalproex (DEPAKOTE ER) 500 MG 24 hr tablet Take 1 tablet (500 mg total) by mouth daily. For mood stabilization 30 tablet 0   . hydrOXYzine (ATARAX/VISTARIL) 25 MG tablet Take 1 tablet (25 mg total) by mouth every 6 (six) hours as needed for anxiety. 60 tablet 0   . risperiDONE (RISPERDAL) 3 MG tablet Take 1 tablet (3 mg total) by mouth 2 (two) times daily. For mood control 60 tablet 0   .  traZODone (DESYREL) 100 MG tablet Take 1 tablet (100 mg total) by mouth at bedtime as needed for sleep. 30 tablet 0     Musculoskeletal: Strength & Muscle Tone: within normal limits Gait & Station: normal Patient leans: N/A  Psychiatric Specialty Exam: Physical Exam  ROS  Blood pressure (!) 155/90, pulse 86, temperature 98 F (36.7 C), temperature source Oral, resp. rate 18.There is no height or weight on file to calculate BMI.  General Appearance: Guarded  Eye Contact:  Minimal  Speech:  Clear and Coherent  Volume:  Normal  Mood:  generally contained  Affect:  Blunt  Thought Process:  Disorganized and Irrelevant  Orientation:  Full (Time, Place, and Person)  Thought Content:  Illogical, Rumination and Tangential  Suicidal Thoughts:  No  Homicidal Thoughts:  No  Memory:  Immediate;   Poor  Judgement:  Impaired  Insight:  Shallow  Psychomotor Activity:  Decreased  Concentration:  Concentration: Poor  Recall:  Poor  Fund of Knowledge:  Poor  Language:  Good  Akathisia:  Negative  Handed:  Right  AIMS (if indicated):     Assets:  Communication Skills  ADL's:  Intact  Cognition:  WNL  Sleep:       Treatment Plan Summary: Daily contact with patient to assess and evaluate symptoms and progress in treatment, Medication management and Plan Continue reality-based therapy resume meds  Observation Level/Precautions:  15 minute checks  Laboratory:  UDS  Psychotherapy:    Medications:    Consultations:    Discharge Concerns:    Estimated LOS:  Other:     Physician Treatment Plan for Primary Diagnosis: <principal problem not specified> Long Term Goal(s): Improvement in symptoms so as ready for discharge  Short Term Goals: Ability to verbalize feelings will improve  Physician Treatment Plan for Secondary Diagnosis: Active Problems:   Schizoaffective disorder, bipolar type ([place])  Long Term Goal(s): Improvement in symptoms so as ready for discharge  Short Term Goals:  Ability to demonstrate self-control will improve  I certify that inpatient services furnished can reasonably be expected to improve the patient's condition.    [name], MD 4/24/202012:55 PM

## 2018-12-20 NOTE — Progress Notes (Signed)
Patient ID: [name], female   DOB: 03/15/1971, 48 y.o.   MRN: [id number]  D: Pt alert and oriented during [facility] admission process. Pt denies SI/HI, A/VH, and any pain. Pt is cooperative.  HPI "Is a 48 y/o looking older than stated age AAF, known to our service with hx of schizophrenia, Bipolar d/o, GAD presenting with both disorganized speech and thought, tangential, inattentive and inappropriate. She is endorsing fear, but denies any self harm, self injurious behaviors, SI/SA or HI. Dur to profound neurocognitive retardation and delusional thought pattern further subjective information is not obtainable per patient."  A: Education, support, reassurance, and encouragement provided, q15 minute safety checks initiated. Pt's belongings in locker #32.    R: Pt denies any concerns at this time, and verbally contracts for safety. Pt ambulating on the unit with no issues. Pt remains safe on and off the unit.

## 2018-12-20 NOTE — Progress Notes (Signed)
Recreation Therapy Notes  INPATIENT RECREATION THERAPY ASSESSMENT  Patient Details Name: [name] MRN: [id number] DOB: Jan 11, 1971 Today's Date: 12/20/2018       Information Obtained From: Patient  Able to Participate in Assessment/Interview: Yes  Patient Presentation: Hyperverbal  Reason for Admission (Per Patient): Suicidal Ideation  Patient Stressors: Other (Comment)(Pt broke a window at her home)  Coping Skills:   Isolation, Journal, Sports, TV, Arguments, Aggression, Music, Exercise, Meditate, Prayer, Read  Leisure Interests (2+):  Community - Other (Comment)(church, ride the bus, pay bills, work)  Frequency of Recreation/Participation: Weekly  Awareness of Community Resources:  Yes  Community Resources:  [employer], Other (Comment)([employer])  Current Use: Yes  If no, Barriers?:    Expressed Interest in [place]: No  [employer] of Residence:  [place]  Patient 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

## 2018-12-20 NOTE — BHH Suicide Risk Assessment (Signed)
[facility] Admission Suicide Risk Assessment   Nursing information obtained from:  Patient Demographic factors:  Living alone Current Mental Status:  NA Loss Factors:  NA Historical Factors:  Impulsivity, Family history of mental illness or substance abuse Risk Reduction Factors:  Religious beliefs about death  Total Time spent with patient: 45 minutes Principal Problem: Exacerbation of underlying psychotic disorder Diagnosis:  Active Problems:   Schizoaffective disorder, bipolar type ([place])  Subjective Data: Psychosis thus requires readmission as above  Continued Clinical Symptoms:    The "Alcohol Use Disorders Identification Test", Guidelines for Use in Primary Care, Second Edition.  World Pharmacologist [facility]). Score between 0-7:  no or low risk or alcohol related problems. Score between 8-15:  moderate risk of alcohol related problems. Score between 16-19:  high risk of alcohol related problems. Score 20 or above:  warrants further diagnostic evaluation for alcohol dependence and treatment.   CLINICAL FACTORS:   Schizophrenia:   Paranoid or undifferentiated type   COGNITIVE FEATURES THAT CONTRIBUTE TO RISK:  Closed-mindedness and Loss of executive function    SUICIDE RISK:   Minimal: No identifiable suicidal ideation.  Patients presenting with no risk factors but with morbid ruminations; may be classified as minimal risk based on the severity of the depressive symptoms  PLAN OF CARE: Admitted for stabilization denies suicidal thoughts plans or intent today  I certify that inpatient services furnished can reasonably be expected to improve the patient's condition.   Yvette Hai, MD 12/20/2018, 11:56 AM

## 2018-12-20 NOTE — Progress Notes (Addendum)
SPIRITUAL CARE GROUP    Pt attended spiritual care group facilitated by chaplain Jerene Pitch, MDiv, [facility]    Group focused on topic of [name].  Pts engaged in facilitated dialog around meaning of hope.  Participated in visual explorer exercise to define hope for themselves today.    Present throughout group.  Engaged and talkative, somewhat monopolizing, redirectable. Was loosely connected to topic during conversation, speaking of her experience with catholic faith and being in an order of nuns

## 2018-12-20 NOTE — Tx Team (Signed)
Initial Treatment Plan 12/20/2018 6:57 PM [name] TUU:[id number]    PATIENT STRESSORS: Medication change or noncompliance   PATIENT STRENGTHS: Religious Affiliation Supportive family/friends   PATIENT IDENTIFIED PROBLEMS: Psychosis  Non-medication adherence                   DISCHARGE CRITERIA:  Ability to meet basic life and health needs Improved stabilization in mood, thinking, and/or behavior Verbal commitment to aftercare and medication compliance  PRELIMINARY DISCHARGE PLAN: Outpatient therapy Return to previous living arrangement  PATIENT/FAMILY INVOLVEMENT: This treatment plan has been presented to and reviewed with the patient, [name], and/or family member.  The patient and family have been given the opportunity to ask questions and make suggestions.  Harriet Masson, RN 12/20/2018, 6:57 PM

## 2018-12-20 NOTE — BH Assessment (Signed)
[facility] Assessment Progress Note  Per Buford Dresser, DO, this pt requires psychiatric hospitalization at this time.  Letitia Libra, RN, [facility] has assigned pt to [facility] Rm [id number]; Adult Unit will be ready to receive pt shortly.  Pt will need to sign Voluntary Admission and Consent for Treatment.  Pt's nurse, Jan, has been notified.    Jalene Mullet, [place] Coordinator [phone]

## 2018-12-20 NOTE — Progress Notes (Signed)
Pt. Presents awake, alert and oriented, she denies SI, HI or AVH this evening although she is heard responding to stimuli.  Pt. Showered after which she went to bed.  She denied any pain and had no new complaints. Patient given emotional support from RN. Patient encouraged to come to staff with concerns and/or questions. [name] orders and plan of care reviewed.  Patient remains appropriate and cooperative. Will continue to monitor patient q15 minutes for safety.

## 2018-12-20 NOTE — Tx Team (Signed)
Interdisciplinary Treatment and Diagnostic Plan Update  12/20/2018 Time of Session:  [name] MRN: [id number]  Principal Diagnosis: <principal problem not specified>  Secondary Diagnoses: Active Problems:   Schizoaffective disorder, bipolar type (HCC)   Current Medications:  Current Facility-Administered Medications  Medication Dose Route Frequency Provider Last Rate Last Dose  . acetaminophen (TYLENOL) tablet 650 mg  650 mg Oral Q6H PRN Laverle Hobby, PA-C      . albuterol (VENTOLIN HFA) 108 (90 Base) MCG/ACT inhaler 1-2 puff  1-2 puff Inhalation Q4H PRN Ethelene Hal, NP      . alum & mag hydroxide-simeth (MAALOX/MYLANTA) 200-200-20 MG/5ML suspension 30 mL  30 mL Oral Q4H PRN Laverle Hobby, PA-C      . ARIPiprazole ER (ABILIFY MAINTENA) injection 400 mg  400 mg Intramuscular Q28 days Ethelene Hal, NP   400 mg at 12/19/18 1138  . benztropine (COGENTIN) tablet 0.5 mg  0.5 mg Oral BID Ethelene Hal, NP   0.5 mg at 12/20/18 1540  . divalproex (DEPAKOTE) DR tablet 500 mg  500 mg Oral Daily Ethelene Hal, NP   500 mg at 12/20/18 0867   And  . divalproex (DEPAKOTE) DR tablet 750 mg  750 mg Oral QHS Ethelene Hal, NP      . hydrOXYzine (ATARAX/VISTARIL) tablet 25 mg  25 mg Oral Q6H PRN Laverle Hobby, PA-C      . hydrOXYzine (ATARAX/VISTARIL) tablet 50 mg  50 mg Oral TID PRN Ethelene Hal, NP      . risperiDONE (RISPERDAL M-TABS) disintegrating tablet 2 mg  2 mg Oral Q8H PRN Laverle Hobby, PA-C       And  . LORazepam (ATIVAN) tablet 1 mg  1 mg Oral PRN Laverle Hobby, PA-C       And  . ziprasidone (GEODON) injection 20 mg  20 mg Intramuscular PRN Patriciaann Clan E, PA-C      . ziprasidone (GEODON) injection 20 mg  20 mg Intramuscular Q12H PRN Ethelene Hal, NP       And  . LORazepam (ATIVAN) tablet 1 mg  1 mg Oral PRN Ethelene Hal, NP      . magnesium hydroxide (MILK OF MAGNESIA) suspension 30 mL  30 mL Oral  Daily PRN Laverle Hobby, PA-C      . risperiDONE (RISPERDAL) tablet 3 mg  3 mg Oral BID Ethelene Hal, NP   3 mg at 12/20/18 0906  . traZODone (DESYREL) tablet 100 mg  100 mg Oral QHS,MR X 1 Simon, Spencer E, PA-C      . traZODone (DESYREL) tablet 100 mg  100 mg Oral QHS PRN Ethelene Hal, NP       PTA Medications: Medications Prior to Admission  Medication Sig Dispense Refill Last Dose  . albuterol (PROVENTIL HFA;VENTOLIN HFA) 108 (90 Base) MCG/ACT inhaler Inhale 2 puffs into the lungs every 6 (six) hours as needed for wheezing or shortness of breath. 1 Inhaler 0   . ARIPiprazole ER (ABILIFY MAINTENA) 400 MG SRER injection Inject 2 mLs (400 mg total) into the muscle every 28 (twenty-eight) days. Due on 11-06-18 1 each 0   . benztropine (COGENTIN) 0.5 MG tablet Take 1 tablet (0.5 mg total) by mouth 2 (two) times daily. For prevention of drug induced tremors 60 tablet 0   . budesonide-formoterol (SYMBICORT) 160-4.5 MCG/ACT inhaler Inhale 2 puffs into the lungs 2 (two) times daily. For Shortness of breath 1 Inhaler 0   .  divalproex (DEPAKOTE ER) 250 MG 24 hr tablet Take 3 tablets (750 mg total) by mouth at bedtime. For mood stabilization 90 tablet 0   . divalproex (DEPAKOTE ER) 500 MG 24 hr tablet Take 1 tablet (500 mg total) by mouth daily. For mood stabilization 30 tablet 0   . hydrOXYzine (ATARAX/VISTARIL) 25 MG tablet Take 1 tablet (25 mg total) by mouth every 6 (six) hours as needed for anxiety. 60 tablet 0   . risperiDONE (RISPERDAL) 3 MG tablet Take 1 tablet (3 mg total) by mouth 2 (two) times daily. For mood control 60 tablet 0   . traZODone (DESYREL) 100 MG tablet Take 1 tablet (100 mg total) by mouth at bedtime as needed for sleep. 30 tablet 0     Patient Stressors:    Patient Strengths:    Treatment Modalities: Medication Management, Group therapy, Case management,  1 to 1 session with clinician, Psychoeducation, Recreational therapy.   Physician Treatment Plan  for Primary Diagnosis: <principal problem not specified> Long Term Goal(s):     Short Term Goals:    Medication Management: Evaluate patient's response, side effects, and tolerance of medication regimen.  Therapeutic Interventions: 1 to 1 sessions, Unit Group sessions and Medication administration.  Evaluation of Outcomes: Not Met  Physician Treatment Plan for Secondary Diagnosis: Active Problems:   Schizoaffective disorder, bipolar type ([place])  Long Term Goal(s):     Short Term Goals:       Medication Management: Evaluate patient's response, side effects, and tolerance of medication regimen.  Therapeutic Interventions: 1 to 1 sessions, Unit Group sessions and Medication administration.  Evaluation of Outcomes: Not Met   RN Treatment Plan for Primary Diagnosis: <principal problem not specified> Long Term Goal(s): Knowledge of disease and therapeutic regimen to maintain health will improve  Short Term Goals: Ability to participate in decision making will improve, Ability to verbalize feelings will improve, Ability to disclose and discuss suicidal ideas, Ability to identify and develop effective coping behaviors will improve and Compliance with prescribed medications will improve  Medication Management: RN will administer medications as ordered by provider, will assess and evaluate patient's response and provide education to patient for prescribed medication. RN will report any adverse and/or side effects to prescribing provider.  Therapeutic Interventions: 1 on 1 counseling sessions, Psychoeducation, Medication administration, Evaluate responses to treatment, Monitor vital signs and CBGs as ordered, Perform/monitor CIWA, COWS, AIMS and Fall Risk screenings as ordered, Perform wound care treatments as ordered.  Evaluation of Outcomes: Not Met   LCSW Treatment Plan for Primary Diagnosis: <principal problem not specified> Long Term Goal(s): Safe transition to appropriate next level  of care at discharge, Engage patient in therapeutic group addressing interpersonal concerns.  Short Term Goals: Engage patient in aftercare planning with referrals and resources and Increase skills for wellness and recovery  Therapeutic Interventions: Assess for all discharge needs, 1 to 1 time with Social worker, Explore available resources and support systems, Assess for adequacy in community support network, Educate family and significant other(s) on suicide prevention, Complete Psychosocial Assessment, Interpersonal group therapy.  Evaluation of Outcomes: Not Met   Progress in Treatment: Attending groups: No.New admit to the unit Participating in groups: No. New admit to the unit Taking medication as prescribed: Yes. Toleration medication: Yes. Family/Significant other contact made: No, will contact:  will contact if patient gives consent for contact Patient understands diagnosis: Yes. Discussing patient identified problems/goals with staff: Yes. Medical problems stabilized or resolved: Yes. Denies suicidal/homicidal ideation: Yes. Issues/concerns per patient  self-inventory: No. Other:   New problem(s) identified: None  New Short Term/Long Term Goal(s):   Patient Goals:    Discharge Plan or Barriers: Patient was recently admitted, 12/20/2018. CSW will continue to follow and assess for appropriate referrals and possible discharge planning.   Reason for Continuation of Hospitalization: Anxiety Medication stabilization  Estimated Length of Stay: 3-5 days  Attendees: Patient: 12/20/2018 11:31 AM  Physician: Dr. Jake Samples 12/20/2018 11:31 AM  Nursing: Elberta Fortis, RN 12/20/2018 11:31 AM  RN Care Manager: 12/20/2018 11:31 AM  Social Worker: Ardelle Anton, LCSW 12/20/2018 11:31 AM  Recreational Therapist:  12/20/2018 11:31 AM  Other:  12/20/2018 11:31 AM  Other:  12/20/2018 11:31 AM  Other: 12/20/2018 11:31 AM   Scribe for Treatment Team: Trecia Rogers, LCSW 12/20/2018 11:31 AM

## 2018-12-20 NOTE — Progress Notes (Signed)
Pt observed in her room responding to external stimuli talking to herself. Pt denies si and hi. She denies hallucinations however she actively responding. Pt is to be transferred to [address] hall when bed available.  A:Offered support, encouragement, medication and 15 minute checks. EKG completed. R:Safety maintained on the obs unit.

## 2018-12-20 NOTE — Progress Notes (Signed)
Patient ID: [name], female   DOB: 1971-05-22, 48 y.o.   MRN: [id number]   [place] NOVEL CORONAVIRUS (COVID-19) DAILY CHECK-OFF SYMPTOMS - answer yes or no to each - every day NO YES  Have you had a fever in the past 24 hours?  . Fever (Temp > 37.80C / 100F) X   Have you had any of these symptoms in the past 24 hours? . New Cough .  Sore Throat  .  Shortness of Breath .  Difficulty Breathing .  Unexplained Body Aches   X   Have you had any one of these symptoms in the past 24 hours not related to allergies?   . Runny Nose .  Nasal Congestion .  Sneezing   X   If you have had runny nose, nasal congestion, sneezing in the past 24 hours, has it worsened?  X   EXPOSURES - check yes or no X   Have you traveled outside the state in the past 14 days?  X   Have you been in contact with someone with a confirmed diagnosis of COVID-19 or PUI in the past 14 days without wearing appropriate PPE?  X   Have you been living in the same home as a person with confirmed diagnosis of COVID-19 or a PUI (household contact)?    X   Have you been diagnosed with COVID-19?    X              What to do next: Answered NO to all: Answered YES to anything:   Proceed with unit schedule Follow the [facility] Inpatient Flowsheet.

## 2018-12-21 DIAGNOSIS — I10 Essential (primary) hypertension: Secondary | ICD-10-CM

## 2018-12-21 NOTE — Progress Notes (Signed)
[name] MD Progress Note  12/21/2018 9:54 AM Arleth Mccullar  MRN:  [id number] Subjective:  48 years of age and she really presented on 7/82 phoning the police for help she as usual was very rambling she tells me that she is separated from her husband that there were certain triggers that led her to have suicidal thoughts but again she tells me she is not suicidal now she rambles on about numerous unrelated topics is very hard to get a meaningful history other than the meaning being her thoughts are disorganized and tangential with very loose associations.  She is not been sleeping she apparently is been going to [facility] for outpatient therapy was last with [place] in February and discharged on the seventh that month.   Objective: Patient is a 48 year old female with the above-stated past psychiatric history is seen in follow-up.  She was admitted yesterday with exacerbation of her schizoaffective disorder; bipolar type with psychotic features.  She is still significantly ill.  She is tangential, disorganized, responding to internal stimuli, psychomotor agitated and paranoid.  Her speech is pressured as well.  At discharge in February she was receiving the long-acting Abilify injection, Risperdal 3 mg p.o. twice daily, Cogentin, Depakote 500 mg p.o. daily and 750 mg p.o. nightly and trazodone.  On admission she is been placed back on the Risperdal 3 mg p.o. twice daily, trazodone and Depakote DR as per below.  She also received the Abilify long-acting injection on 12/19/2018.  Her blood pressure is mildly elevated this morning at 134/98, she is tachycardic with a rate of 121, she is afebrile.  Her EKG on admission showed a normal sinus rhythm with a rate of 77.  Review of her laboratories were found to be essentially normal.  She only slept approximately 2 hours last night.  Principal Problem: <principal problem not specified> Diagnosis: Active Problems:   Schizoaffective disorder, bipolar type ([place])  Total Time spent  with patient: 15 minutes  Past Psychiatric History: See admission H&P  Past Medical History:  Past Medical History:  Diagnosis Date  . Anemia   . Anxiety   . Asthma   . Bipolar 1 disorder ([place])   . Breast cancer ([place])   . Depression   . Diabetes mellitus without complication ([place])   . Hypertension   . Insomnia, persistent   . Schizophrenic disorder ([place])   . Seizures ([place])     Past Surgical History:  Procedure Laterality Date  . BREAST SURGERY Left   . TONSILLECTOMY     Family History:  Family History  Problem Relation Age of Onset  . Depression Mother   . Gout Mother   . Cancer Father        prostate  . Other Father        lung issue  . Alcoholism Other   . Heart attack Paternal Grandfather   . Heart attack Paternal Grandmother   . Heart attack Maternal Grandmother   . Heart attack Maternal Grandfather   . Depression Son   . Anxiety disorder Son    Family Psychiatric  History: See admission H&P Social History:  Social History   Substance and Sexual Activity  Alcohol Use Yes   Comment: BAC not available at time of assessment     Social History   Substance and Sexual Activity  Drug Use No    Social History   Socioeconomic History  . Marital status: Single    Spouse name: Not on file  . Number of children:  Not on file  . Years of education: Not on file  . Highest education level: Not on file  Occupational History  . Not on file  Social Needs  . Financial resource strain: Not on file  . Food insecurity:    Worry: Not on file    Inability: Not on file  . Transportation needs:    Medical: Not on file    Non-medical: Not on file  Tobacco Use  . Smoking status: Former Smoker    Types: Cigarettes  . Smokeless tobacco: Never Used  Substance and Sexual Activity  . Alcohol use: Yes    Comment: BAC not available at time of assessment  . Drug use: No  . Sexual activity: Not Currently  Lifestyle  . Physical activity:    Days per week: Not on file     Minutes per session: Not on file  . Stress: Not on file  Relationships  . Social connections:    Talks on phone: Not on file    Gets together: Not on file    Attends religious service: Not on file    Active member of club or organization: Not on file    Attends meetings of clubs or organizations: Not on file    Relationship status: Not on file  Other Topics Concern  . Not on file  Social History Narrative  . Not on file   Additional Social History:                         Sleep: Poor  Appetite:  Fair  Current Medications: Current Facility-Administered Medications  Medication Dose Route Frequency Provider Last Rate Last Dose  . acetaminophen (TYLENOL) tablet 650 mg  650 mg Oral Q6H PRN Patriciaann Clan E, PA-C      . albuterol (VENTOLIN HFA) 108 (90 Base) MCG/ACT inhaler 1-2 puff  1-2 puff Inhalation Q4H PRN Ethelene Hal, NP   1 puff at 12/20/18 1821  . alum & mag hydroxide-simeth (MAALOX/MYLANTA) 200-200-20 MG/5ML suspension 30 mL  30 mL Oral Q4H PRN Patriciaann Clan E, PA-C      . ARIPiprazole ER (ABILIFY MAINTENA) injection 400 mg  400 mg Intramuscular Q28 days Ethelene Hal, NP   400 mg at 12/19/18 1138  . benztropine (COGENTIN) tablet 0.5 mg  0.5 mg Oral BID Ethelene Hal, NP   0.5 mg at 12/21/18 0834  . divalproex (DEPAKOTE) DR tablet 500 mg  500 mg Oral Daily Ethelene Hal, NP   500 mg at 12/21/18 8676   And  . divalproex (DEPAKOTE) DR tablet 750 mg  750 mg Oral QHS Ethelene Hal, NP   750 mg at 12/20/18 2117  . hydrOXYzine (ATARAX/VISTARIL) tablet 25 mg  25 mg Oral Q6H PRN Laverle Hobby, PA-C   25 mg at 12/20/18 1821  . hydrOXYzine (ATARAX/VISTARIL) tablet 50 mg  50 mg Oral TID PRN Ethelene Hal, NP   50 mg at 12/21/18 0234  . risperiDONE (RISPERDAL M-TABS) disintegrating tablet 2 mg  2 mg Oral Q8H PRN Laverle Hobby, PA-C       And  . LORazepam (ATIVAN) tablet 1 mg  1 mg Oral PRN Laverle Hobby, PA-C        And  . ziprasidone (GEODON) injection 20 mg  20 mg Intramuscular PRN Laverle Hobby, PA-C      . ziprasidone (GEODON) injection 20 mg  20 mg Intramuscular Q12H PRN Ethelene Hal,  NP       And  . LORazepam (ATIVAN) tablet 1 mg  1 mg Oral PRN Ethelene Hal, NP      . magnesium hydroxide (MILK OF MAGNESIA) suspension 30 mL  30 mL Oral Daily PRN Laverle Hobby, PA-C      . risperiDONE (RISPERDAL) tablet 3 mg  3 mg Oral BID Ethelene Hal, NP   3 mg at 12/21/18 0834  . traZODone (DESYREL) tablet 100 mg  100 mg Oral QHS PRN Ethelene Hal, NP   100 mg at 12/20/18 2118  . traZODone (DESYREL) tablet 50 mg  50 mg Oral QHS,MR X 1 Laverle Hobby, PA-C   Stopped at 12/20/18 2200    Lab Results: No results found for this or any previous visit (from the past 48 hour(s)).  Blood Alcohol level:  Lab Results  Component Value Date   ETH <10 10/04/2018   ETH <10 67/59/1638    Metabolic Disorder Labs: Lab Results  Component Value Date   HGBA1C 5.0 08/27/2018   MPG 96.8 08/27/2018   MPG 96.8 11/21/2017   Lab Results  Component Value Date   PROLACTIN 4.2 (L) 12/19/2018   PROLACTIN 7.5 08/27/2018   Lab Results  Component Value Date   CHOL 161 12/19/2018   TRIG 55 12/19/2018   HDL 53 12/19/2018   CHOLHDL 3.0 12/19/2018   VLDL 11 12/19/2018   LDLCALC 97 12/19/2018   LDLCALC 111 (H) 08/27/2018    Physical Findings: AIMS: Facial and Oral Movements Muscles of Facial Expression: None, normal Lips and Perioral Area: None, normal Jaw: None, normal Tongue: None, normal,Extremity Movements Upper (arms, wrists, hands, fingers): None, normal Lower (legs, knees, ankles, toes): None, normal, Trunk Movements Neck, shoulders, hips: None, normal, Overall Severity Severity of abnormal movements (highest score from questions above): None, normal Incapacitation due to abnormal movements: None, normal [name] awareness of abnormal movements (rate only patient's  report): No Awareness, Dental Status Current problems with teeth and/or dentures?: No Does patient usually wear dentures?: No  CIWA:  CIWA-Ar Total: 0 COWS:     Musculoskeletal: Strength & Muscle Tone: within normal limits Gait & Station: normal Patient leans: N/A  Psychiatric Specialty Exam: Physical Exam  Nursing note and vitals reviewed. Constitutional: She is oriented to person, place, and time. She appears well-developed and well-nourished.  HENT:  Head: Normocephalic and atraumatic.  Respiratory: Effort normal.  Neurological: She is alert and oriented to person, place, and time.    ROS  Blood pressure (!) 134/98, pulse (!) 121, temperature 98 F (36.7 C), temperature source Oral, resp. rate 18, height 5\' 8"  (1.727 m), weight 88.5 kg.Body mass index is 29.65 kg/m.  General Appearance: Disheveled  Eye Contact:  Good  Speech:  Pressured  Volume:  Increased  Mood:  Anxious, Euphoric and Irritable  Affect:  Labile  Thought Process:  Disorganized and Descriptions of Associations: Loose  Orientation:  Full (Time, Place, and Person)  Thought Content:  Delusions, Hallucinations: Auditory and Paranoid Ideation  Suicidal Thoughts:  No  Homicidal Thoughts:  No  Memory:  Immediate;   Poor Recent;   Poor Remote;   Poor  Judgement:  Impaired  Insight:  Lacking  Psychomotor Activity:  Increased  Concentration:  Concentration: Poor and Attention Span: Poor  Recall:  Poor  Fund of Knowledge:  Fair  Language:  Fair  Akathisia:  Negative  Handed:  Right  AIMS (if indicated):     Assets:  Desire for Improvement Resilience  ADL's:  Impaired  Cognition:  WNL  Sleep:  Number of Hours: 2.5     Treatment Plan Summary: Daily contact with patient to assess and evaluate symptoms and progress in treatment, Medication management and Plan : Patient is seen and examined.  Patient is a 48 year old female with the above-stated past psychiatric history who is seen in follow-up.    Objective: #1 schizoaffective disorder; bipolar type, manic with psychotic features, #2 hypertension  Patient remains significantly ill.  She is clearly responding to internal stimuli this morning, she is paranoid, disorganized, tangential and pressured.  It appears as though she is really only been back on her medications for less than 24 hours.  No change in her current psychiatric medications.  We will monitor her blood pressure and her tachycardia.  Hopefully when her medications are back in her system that she will calm down and her pressure and pulse will decrease as well. 1.  Continue Abilify extended release injection 400 mg IM x1 given on 12/19/2018 for psychosis. 2.  Continue Cogentin 0.5 mg p.o. twice daily for side effects of antipsychotic medications. 3.  Continue Depakote DR 500 mg p.o. daily and 750 mg p.o. nightly for mood stability. 4.  Continue Risperdal 3 mg p.o. twice daily for psychosis. 5.  Continue trazodone 100 mg p.o. nightly as needed insomnia. 6.  Monitor blood pressure and tachycardia. 7.  Disposition planning-in progress.  Sharma Covert, MD 12/21/2018, 9:54 AM

## 2018-12-21 NOTE — Progress Notes (Signed)
D: Patient observed resting in bed this evening and was observed and overheard rambling loudly to self with animated affect. On assessment by this writer, patient is hyperverbal, intrusive and speech reveals loose associations. Grandiose at times and reports having multiple college degrees.  Denies pain, physical complaints. COVID-19 screen negative, afebrile. Respiratory assessment WDL.  A: Medicated per orders, no prns requested or required. Medication education provided. Level III obs in place for safety. Emotional support offered.  Encouraged to attend and participate in unit programming.   R: Patient verbalizes understanding of POC. Patient denies SI/HI and remains safe on level III obs. Will continue to monitor throughout the night.
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
## 2018-12-21 NOTE — [facility] Group Notes (Signed)
  [facility] LCSW Group Therapy Note  Date/Time:  12/21/2018 11:15AM-12:00PM  Type of Therapy and Topic:  Group Therapy:  Feelings About Hospitalization  Participation Level:  Active   Description of Group This process group involved patients discussing their feelings related to being hospitalized, as well as the benefits they see to being in the hospital.  These feelings and benefits were itemized.  The group then brainstormed specific ways in which they could seek those same benefits when they discharge and return home.  Therapeutic Goals 1. Patient will identify and describe positive and negative feelings related to hospitalization 2. Patient will verbalize benefits of hospitalization to themselves personally 3. Patients will brainstorm together ways they can obtain similar benefits in the outpatient setting, identify barriers to wellness and possible solutions  Summary of Patient Progress:  The patient expressed her primary feelings about being hospitalized are "it's necessary" and "when you need help, you ask for it."  She appeared to be manic and delusional, and while she spoke for long periods of time at various points during group, her concepts could not be followed by CSW due to tangentiality and disorganization.  Therapeutic Modalities Cognitive Behavioral Therapy Motivational Interviewing    Selmer Dominion, LCSW 12/21/2018, 1:23 PM

## 2018-12-21 NOTE — Plan of Care (Signed)
  Problem: Activity: Goal: Interest or engagement in activities will improve Outcome: Progressing   D: Pt alert and oriented on the unit. Pt engaging with RN staff and other pts while sitting in the day room watching television. Pt denies SI/HI, A/VH but is observed responding. Pt attended and participated during group activities also.  A: Education, support and encouragement provided, q15 minute safety checks remain in effect. Medications administered per MD orders. R: No reactions/side effects to medicine noted. Pt denies any concerns at this time, and verbally contracts for safety.Pt ambulating on the unit with no issues. Pt remains safe on and off the unit.

## 2018-12-21 NOTE — Progress Notes (Signed)
Pt continues to be hyper verbal needing redirection, but pt pleasant this evening.

## 2018-12-21 NOTE — Progress Notes (Signed)
D: Pt denies SI/HI/AVH. Pt is pleasant and cooperative. Pt stated she had a good day, pt hyperverbal and visible on the milieu this evening A: Pt was offered support and encouragement. Pt was given scheduled medications. Pt was encourage to attend groups. Q 15 minute checks were done for safety.  R:Pt attends groups and interacts well with peers and staff. Pt is taking medication. Pt has no complaints.Pt receptive to treatment and safety maintained on unit.  Problem: Education: Goal: Emotional status will improve Outcome: Progressing   Problem: Education: Goal: Mental status will improve Outcome: Progressing

## 2018-12-21 NOTE — Progress Notes (Signed)
Patient signed 72 hour Request for Discharge at above time and date. Placed on paper chart.

## 2018-12-21 NOTE — Progress Notes (Signed)
Patient ID: [name], female   DOB: [birth year], 48 y.o.   MRN: [id number]   [name] NOVEL CORONAVIRUS (COVID-19) DAILY CHECK-OFF SYMPTOMS - answer yes or no to each - every day NO YES  Have you had a fever in the past 24 hours?  . Fever (Temp > 37.80C / 100F) X   Have you had any of these symptoms in the past 24 hours? . New Cough .  Sore Throat  .  Shortness of Breath .  Difficulty Breathing .  Unexplained Body Aches   X   Have you had any one of these symptoms in the past 24 hours not related to allergies?   . Runny Nose .  Nasal Congestion .  Sneezing   X   If you have had runny nose, nasal congestion, sneezing in the past 24 hours, has it worsened?  X   EXPOSURES - check yes or no X   Have you traveled outside the state in the past 14 days?  X   Have you been in contact with someone with a confirmed diagnosis of COVID-19 or PUI in the past 14 days without wearing appropriate PPE?  X   Have you been living in the same home as a person with confirmed diagnosis of COVID-19 or a PUI (household contact)?    X   Have you been diagnosed with COVID-19?    X              What to do next: Answered NO to all: Answered YES to anything:   Proceed with unit schedule Follow the [facility] Inpatient Flowsheet.

## 2018-12-21 NOTE — [facility] Group Notes (Signed)
LCSW Group Therapy Note  12/21/2018    10:00-11:00am   Type of Therapy and Topic:  Group Therapy: Early Messages Received About Anger  Participation Level:  Did Not Attend   Description of Group:   In this group, patients shared and discussed the early messages received in their lives about anger through parental or other adult modeling, teaching, repression, punishment, violence, and more.  Participants identified how those childhood lessons influence even now how they usually or often react when angered.  The group discussed that anger is a secondary emotion and what may be the underlying emotional themes that come out through anger outbursts or that are ignored through anger suppression.  Finally, as a group there was a conversation about the workbook's quote that "There is nothing wrong with anger; it is just a sign something needs to change."     Therapeutic Goals: 1. Patients will identify one or more childhood message about anger that they received and how it was taught to them. 2. Patients will discuss how these childhood experiences have influenced and continue to influence their own expression or repression of anger even today. 3. Patients will explore possible primary emotions that tend to fuel their secondary emotion of anger. 4. Patients will learn that anger itself is normal and cannot be eliminated, and that healthier coping skills can assist with resolving conflict rather than worsening situations.  Summary of Patient Progress:  Did not attend  Therapeutic Modalities:   Cognitive Behavioral Therapy Motivation Interviewing  Elisabeth Pigeon

## 2018-12-22 MED ORDER — DIVALPROEX SODIUM 250 MG PO DR TAB
750.0000 mg | DELAYED_RELEASE_TABLET | Freq: Two times a day (BID) | ORAL | Status: DC
  Administered 2018-12-22 – 2018-12-24 (×4): 750 mg via ORAL
  Filled 2018-12-22 (×9): qty 3

## 2018-12-22 MED ORDER — RISPERIDONE 3 MG PO TABS
6.0000 mg | ORAL_TABLET | Freq: Every day | ORAL | Status: DC
  Administered 2018-12-22 – 2018-12-23 (×2): 6 mg via ORAL
  Filled 2018-12-22 (×4): qty 2

## 2018-12-22 MED ORDER — RISPERIDONE 3 MG PO TABS
3.0000 mg | ORAL_TABLET | Freq: Every day | ORAL | Status: DC
  Administered 2018-12-23 – 2018-12-24 (×2): 3 mg via ORAL
  Filled 2018-12-22 (×5): qty 1

## 2018-12-22 NOTE — Progress Notes (Signed)
Adult Psychoeducational Group Note  Date:  12/22/2018 Time:  12:04 AM  Group Topic/Focus:  Wrap-Up Group:   The focus of this group is to help patients review their daily goal of treatment and discuss progress on daily workbooks.  Participation Level:  Active  Participation Quality:  Appropriate  Affect:  Appropriate  Cognitive:  Disorganized  Insight: Appropriate  Engagement in Group:  Engaged  Modes of Intervention:  Discussion  Additional Comments:   Pt stated her goal for today was to interact more with peers and staff. Pt stated she accomplished her goal today. Pt rated her over all day a 4 out of 10. Pt stated she enjoyed all groups held today.  Candy Sledge 12/22/2018, 12:04 AM

## 2018-12-22 NOTE — Progress Notes (Signed)
D: [name] has been isolative, with disorganized speech and appearing to be responding to internal stimuli. She is difficult to engage in conversation. She presented no problem with milieu management, however, and was med compliant. She reported no adverse med effects.   A: Meds given as ordered. Q15 safety checks maintained. Support/encouragement offered.  R: Pt remains free from harm and continues with treatment. Will continue to monitor for needs/safety.

## 2018-12-22 NOTE — Progress Notes (Signed)
D: Pt denies SI/HI/AVH. Pt is pleasant and cooperative. Pt continues to be disorganized at times, pt continues to have conversations with people not seen by staff. Pt redirectable and appropriate this evening. Pt gets hyper religious at times.  A: Pt was offered support and encouragement. Pt was given scheduled medications. Pt was encourage to attend groups. Q 15 minute checks were done for safety.  R:Pt attends groups and interacts well with peers and staff. Pt is taking medication. Pt has no complaints.Pt receptive to treatment and safety maintained on unit.  Problem: Education: Goal: Mental status will improve Outcome: Progressing   Problem: Activity: Goal: Interest or engagement in activities will improve Outcome: Progressing   Problem: Health Behavior/Discharge Planning: Goal: Compliance with treatment plan for underlying cause of condition will improve Outcome: Progressing   Problem: Safety: Goal: Periods of time without injury will increase Outcome: Progressing

## 2018-12-22 NOTE — Progress Notes (Signed)
[facility] MD Progress Note  12/22/2018 9:34 AM [name]  MRN:  [id number] Subjective:  48 years of age and she really presented on 3/57 phoning the police for help she as usual was very rambling she tells me that she is separated from her husband that there were certain triggers that led her to have suicidal thoughts but again she tells me she is not suicidal now she rambles on about numerous unrelated topics is very hard to get a meaningful history other than the meaning being her thoughts are disorganized and tangential with very loose associations. She is not been sleeping she apparently is been going to [facility] for outpatient therapy was last with [place] in February and discharged on the seventh that month.   Objective: Patient is a 48 year old female with the above-stated past psychiatric history seen in follow-up.  She is still significantly manic and psychotic.  She remains pressured and tangential.  She had the thermostat on 90 degrees in her room today, and it was burning up and there.  I recommended that she not do that.  She still very disorganized and at times responding to internal stimuli.  She is psychomotor agitated.  Her blood pressure initially this morning was 124/88, is 142/89.  Her heart rate was 88, it is now 108.  She is afebrile.  She only slept 4.75 hours last night.  No new laboratories.  Principal Problem: <principal problem not specified> Diagnosis: Active Problems:   Schizoaffective disorder, bipolar type ([place])  Total Time spent with patient: 15 minutes  Past Psychiatric History: See admission H&P  Past Medical History:  Past Medical History:  Diagnosis Date  . Anemia   . Anxiety   . Asthma   . Bipolar 1 disorder ([place])   . Breast cancer ([place])   . Depression   . Diabetes mellitus without complication ([place])   . Hypertension   . Insomnia, persistent   . Schizophrenic disorder ([place])   . Seizures ([place])     Past Surgical History:  Procedure Laterality Date  . BREAST  SURGERY Left   . TONSILLECTOMY     Family History:  Family History  Problem Relation Age of Onset  . Depression Mother   . Gout Mother   . Cancer Father        prostate  . Other Father        lung issue  . Alcoholism Other   . Heart attack Paternal Grandfather   . Heart attack Paternal Grandmother   . Heart attack Maternal Grandmother   . Heart attack Maternal Grandfather   . Depression Son   . Anxiety disorder Son    Family Psychiatric  History: See admission H&P Social History:  Social History   Substance and Sexual Activity  Alcohol Use Yes   Comment: BAC not available at time of assessment     Social History   Substance and Sexual Activity  Drug Use No    Social History   Socioeconomic History  . Marital status: Single    Spouse name: Not on file  . Number of children: Not on file  . Years of education: Not on file  . Highest education level: Not on file  Occupational History  . Not on file  Social Needs  . Financial resource strain: Not on file  . Food insecurity:    Worry: Not on file    Inability: Not on file  . Transportation needs:    Medical: Not on file  Non-medical: Not on file  Tobacco Use  . Smoking status: Former Smoker    Types: Cigarettes  . Smokeless tobacco: Never Used  Substance and Sexual Activity  . Alcohol use: Yes    Comment: BAC not available at time of assessment  . Drug use: No  . Sexual activity: Not Currently  Lifestyle  . Physical activity:    Days per week: Not on file    Minutes per session: Not on file  . Stress: Not on file  Relationships  . Social connections:    Talks on phone: Not on file    Gets together: Not on file    Attends religious service: Not on file    Active member of club or organization: Not on file    Attends meetings of clubs or organizations: Not on file    Relationship status: Not on file  Other Topics Concern  . Not on file  Social History Narrative  . Not on file   Additional Social  History:                         Sleep: Poor  Appetite:  Fair  Current Medications: Current Facility-Administered Medications  Medication Dose Route Frequency Provider Last Rate Last Dose  . acetaminophen (TYLENOL) tablet 650 mg  650 mg Oral Q6H PRN Patriciaann Clan E, PA-C      . albuterol (VENTOLIN HFA) 108 (90 Base) MCG/ACT inhaler 1-2 puff  1-2 puff Inhalation Q4H PRN Ethelene Hal, NP   1 puff at 12/20/18 1821  . alum & mag hydroxide-simeth (MAALOX/MYLANTA) 200-200-20 MG/5ML suspension 30 mL  30 mL Oral Q4H PRN Patriciaann Clan E, PA-C      . ARIPiprazole ER (ABILIFY MAINTENA) injection 400 mg  400 mg Intramuscular Q28 days Ethelene Hal, NP   400 mg at 12/19/18 1138  . benztropine (COGENTIN) tablet 0.5 mg  0.5 mg Oral BID Ethelene Hal, NP   0.5 mg at 12/22/18 0820  . divalproex (DEPAKOTE) DR tablet 500 mg  500 mg Oral Daily Ethelene Hal, NP   500 mg at 12/22/18 0820   And  . divalproex (DEPAKOTE) DR tablet 750 mg  750 mg Oral QHS Ethelene Hal, NP   750 mg at 12/21/18 2109  . hydrOXYzine (ATARAX/VISTARIL) tablet 25 mg  25 mg Oral Q6H PRN Laverle Hobby, PA-C   25 mg at 12/21/18 2229  . hydrOXYzine (ATARAX/VISTARIL) tablet 50 mg  50 mg Oral TID PRN Ethelene Hal, NP   50 mg at 12/21/18 2111  . risperiDONE (RISPERDAL M-TABS) disintegrating tablet 2 mg  2 mg Oral Q8H PRN Laverle Hobby, PA-C       And  . LORazepam (ATIVAN) tablet 1 mg  1 mg Oral PRN Laverle Hobby, PA-C       And  . ziprasidone (GEODON) injection 20 mg  20 mg Intramuscular PRN Patriciaann Clan E, PA-C      . ziprasidone (GEODON) injection 20 mg  20 mg Intramuscular Q12H PRN Ethelene Hal, NP       And  . LORazepam (ATIVAN) tablet 1 mg  1 mg Oral PRN Ethelene Hal, NP      . magnesium hydroxide (MILK OF MAGNESIA) suspension 30 mL  30 mL Oral Daily PRN Laverle Hobby, PA-C      . risperiDONE (RISPERDAL) tablet 3 mg  3 mg Oral BID Ethelene Hal, NP  3 mg at 12/22/18 0820  . traZODone (DESYREL) tablet 100 mg  100 mg Oral QHS PRN Ethelene Hal, NP   100 mg at 12/21/18 2109  . traZODone (DESYREL) tablet 50 mg  50 mg Oral QHS,MR X 1 Laverle Hobby, PA-C   50 mg at 12/21/18 2229    Lab Results: No results found for this or any previous visit (from the past 79 hour(s)).  Blood Alcohol level:  Lab Results  Component Value Date   ETH <10 10/04/2018   ETH <10 91/47/8295    Metabolic Disorder Labs: Lab Results  Component Value Date   HGBA1C 5.0 08/27/2018   MPG 96.8 08/27/2018   MPG 96.8 11/21/2017   Lab Results  Component Value Date   PROLACTIN 4.2 (L) 12/19/2018   PROLACTIN 7.5 08/27/2018   Lab Results  Component Value Date   CHOL 161 12/19/2018   TRIG 55 12/19/2018   HDL 53 12/19/2018   CHOLHDL 3.0 12/19/2018   VLDL 11 12/19/2018   LDLCALC 97 12/19/2018   LDLCALC 111 (H) 08/27/2018    Physical Findings: AIMS: Facial and Oral Movements Muscles of Facial Expression: None, normal Lips and Perioral Area: None, normal Jaw: None, normal Tongue: None, normal,Extremity Movements Upper (arms, wrists, hands, fingers): None, normal Lower (legs, knees, ankles, toes): None, normal, Trunk Movements Neck, shoulders, hips: None, normal, Overall Severity Severity of abnormal movements (highest score from questions above): None, normal Incapacitation due to abnormal movements: None, normal [name] awareness of abnormal movements (rate only patient's report): No Awareness, Dental Status Current problems with teeth and/or dentures?: No Does patient usually wear dentures?: No  CIWA:  CIWA-Ar Total: 0 COWS:     Musculoskeletal: Strength & Muscle Tone: within normal limits Gait & Station: normal Patient leans: N/A  Psychiatric Specialty Exam: Physical Exam  Nursing note and vitals reviewed. Constitutional: She is oriented to person, place, and time. She appears well-developed and well-nourished.  HENT:   Head: Normocephalic and atraumatic.  Respiratory: Effort normal.  Neurological: She is alert and oriented to person, place, and time.    ROS  Blood pressure (!) 142/89, pulse (!) 108, temperature 98.3 F (36.8 C), temperature source Oral, resp. rate 20, height 5\' 8"  (1.727 m), weight 88.5 kg.Body mass index is 29.65 kg/m.  General Appearance: Disheveled  Eye Contact:  Fair  Speech:  Pressured  Volume:  Increased  Mood:  Anxious, Dysphoric and Irritable  Affect:  Labile  Thought Process:  Disorganized and Descriptions of Associations: Tangential  Orientation:  Full (Time, Place, and Person)  Thought Content:  Hallucinations: Auditory, Paranoid Ideation and Tangential  Suicidal Thoughts:  No  Homicidal Thoughts:  No  Memory:  Immediate;   Poor Recent;   Poor Remote;   Poor  Judgement:  Impaired  Insight:  Lacking  Psychomotor Activity:  Increased  Concentration:  Concentration: Poor and Attention Span: Poor  Recall:  Poor  Fund of Knowledge:  Fair  Language:  Good  Akathisia:  Negative  Handed:  Right  AIMS (if indicated):     Assets:  Desire for Improvement Resilience  ADL's:  Intact  Cognition:  WNL  Sleep:  Number of Hours: 4.75     Treatment Plan Summary: Daily contact with patient to assess and evaluate symptoms and progress in treatment, Medication management and Plan : Patient is seen and examined.  Patient is a 48 year old female with the above-stated past psychiatric history seen in follow-up.   Objective: #1 schizoaffective disorder; bipolar type, manic  with psychotic features, #2 hypertension  Patient is still significantly manic and psychotic.  I am going to increase her Risperdal to 3 mg p.o. daily and 6 mg p.o. nightly.  I was hoping that we would be able to get her laboratories with regard to her Depakote level, but I am going to go on and increase her Depakote to 750 mg p.o. twice daily.  I think her tachycardia as well as her elevated blood pressure today  is more related with her mania to anything, no changes in those medications at this point. 1.  Patient received the long-acting Abilify injection 400 mg on 12/19/2018 for psychosis. 2.  Continue Cogentin 0.5 mg p.o. twice daily for side effects of antipsychotic medication. 3.  Increase Depakote ER to 750 mg p.o. twice daily for mood stability. 4.  Increase Risperdal to 3 mg p.o. daily and 6 mg p.o. nightly. 5.  Continue trazodone 100 mg p.o. nightly as needed insomnia. 6.  Monitor blood pressure and tachycardia. 7.  Disposition planning-in progress.   Sharma Covert, MD 12/22/2018, 9:34 AM

## 2018-12-22 NOTE — Progress Notes (Signed)
Adult Psychoeducational Group Note  Date:  12/22/2018 Time:  9:38 PM  Group Topic/Focus:  Wrap-Up Group:   The focus of this group is to help patients review their daily goal of treatment and discuss progress on daily workbooks.  Participation Level:  Active  Participation Quality:  Appropriate  Affect:  Appropriate  Cognitive:  Disorganized and Delusional  Insight: Lacking  Engagement in Group:  Engaged  Modes of Intervention:  Discussion  Additional Comments:  Pt stated her goal for today was to talk with doctor about her treatment plan. Pt stated she accomplished her goal today. Pt rated her over all day a 4 out of 10. Pt stated she did not attend any groups today but planned to attend them all tomorrow. Pt stated interacting with staff and peers help improve her day.   Candy Sledge 12/22/2018, 9:38 PM

## 2018-12-22 NOTE — Progress Notes (Signed)
[place] NOVEL CORONAVIRUS (COVID-19) DAILY CHECK-OFF SYMPTOMS - answer yes or no to each - every day NO YES  Have you had a fever in the past 24 hours?  . Fever (Temp > 37.80C / 100F) X   Have you had any of these symptoms in the past 24 hours? . New Cough .  Sore Throat  .  Shortness of Breath .  Difficulty Breathing .  Unexplained Body Aches   X   Have you had any one of these symptoms in the past 24 hours not related to allergies?   . Runny Nose .  Nasal Congestion .  Sneezing   X   If you have had runny nose, nasal congestion, sneezing in the past 24 hours, has it worsened?  X   EXPOSURES - check yes or no X   Have you traveled outside the state in the past 14 days?  X   Have you been in contact with someone with a confirmed diagnosis of COVID-19 or PUI in the past 14 days without wearing appropriate PPE?  X   Have you been living in the same home as a person with confirmed diagnosis of COVID-19 or a PUI (household contact)?    X   Have you been diagnosed with COVID-19?    X              What to do next: Answered NO to all: Answered YES to anything:   Proceed with unit schedule Follow the [facility] Inpatient Flowsheet.   

## 2018-12-22 NOTE — [facility] Group Notes (Signed)
[facility] LCSW Group Therapy Note  Date/Time:  12/22/2018  11:00AM-12:00PM  Type of Therapy and Topic:  Group Therapy:  Music and Mood  Participation Level:  Active   Description of Group: In this process group, members listened to a variety of genres of music and identified that different types of music evoke different responses.  Patients were encouraged to identify music that was soothing for them and music that was energizing for them.  Patients discussed how this knowledge can help with wellness and recovery in various ways including managing depression and anxiety as well as encouraging healthy sleep habits.    Therapeutic Goals: 1. Patients will explore the impact of different varieties of music on mood 2. Patients will verbalize the thoughts they have when listening to different types of music 3. Patients will identify music that is soothing to them as well as music that is energizing to them 4. Patients will discuss how to use this knowledge to assist in maintaining wellness and recovery 5. Patients will explore the use of music as a coping skill  Summary of Patient Progress:  At the beginning of group, patient expressed that she felt "a little sad."  She talked constantly, whether out loud to herself, throughout group.  Her comments were grandiose at times, always tangential.  She was very difficult to redirect, almost impossible to get her to stop talking.  She talked at the end of group of wanting to go to a group home.  Therapeutic Modalities: Solution Focused Brief Therapy Activity   Selmer Dominion, LCSW

## 2018-12-23 NOTE — Progress Notes (Signed)
[facility] MD Progress Note  12/23/2018 12:47 PM [name]  MRN:  [id number] Subjective:   Patient has indeed improved she signed her 72-hour release request on the 25th so we have until tomorrow I do not think it is worth committing her but no chest indicate intermittent disorganization and hallucinations speaking to unseen others she denies all positive symptoms today is generally organized from my interview no EPS or TD makes a few disjointed statements but they cannot be categorized as delusional at the present time.  Vital stable no thoughts of harming self or others probable discharge tomorrow Principal Problem: Exacerbation and underlying bipolar condition/schizoaffective condition Diagnosis: Active Problems:   Schizoaffective disorder, bipolar type ([place])  Total Time spent with patient: 20 minutes  Past Medical History:  Past Medical History:  Diagnosis Date  . Anemia   . Anxiety   . Asthma   . Bipolar 1 disorder ([place])   . Breast cancer ([place])   . Depression   . Diabetes mellitus without complication ([place])   . Hypertension   . Insomnia, persistent   . Schizophrenic disorder ([place])   . Seizures ([place])     Past Surgical History:  Procedure Laterality Date  . BREAST SURGERY Left   . TONSILLECTOMY     Family History:  Family History  Problem Relation Age of Onset  . Depression Mother   . Gout Mother   . Cancer Father        prostate  . Other Father        lung issue  . Alcoholism Other   . Heart attack Paternal Grandfather   . Heart attack Paternal Grandmother   . Heart attack Maternal Grandmother   . Heart attack Maternal Grandfather   . Depression Son   . Anxiety disorder Son     Social History:  Social History   Substance and Sexual Activity  Alcohol Use Yes   Comment: BAC not available at time of assessment     Social History   Substance and Sexual Activity  Drug Use No    Social History   Socioeconomic History  . Marital status: Single    Spouse name:  Not on file  . Number of children: Not on file  . Years of education: Not on file  . Highest education level: Not on file  Occupational History  . Not on file  Social Needs  . Financial resource strain: Not on file  . Food insecurity:    Worry: Not on file    Inability: Not on file  . Transportation needs:    Medical: Not on file    Non-medical: Not on file  Tobacco Use  . Smoking status: Former Smoker    Types: Cigarettes  . Smokeless tobacco: Never Used  Substance and Sexual Activity  . Alcohol use: Yes    Comment: BAC not available at time of assessment  . Drug use: No  . Sexual activity: Not Currently  Lifestyle  . Physical activity:    Days per week: Not on file    Minutes per session: Not on file  . Stress: Not on file  Relationships  . Social connections:    Talks on phone: Not on file    Gets together: Not on file    Attends religious service: Not on file    Active member of club or organization: Not on file    Attends meetings of clubs or organizations: Not on file    Relationship status: Not on file  Other Topics Concern  . Not on file  Social History Narrative  . Not on file   Additional Social History:                         Sleep: Good  Appetite:  Fair  Current Medications: Current Facility-Administered Medications  Medication Dose Route Frequency Provider Last Rate Last Dose  . acetaminophen (TYLENOL) tablet 650 mg  650 mg Oral Q6H PRN Patriciaann Clan E, PA-C      . albuterol (VENTOLIN HFA) 108 (90 Base) MCG/ACT inhaler 1-2 puff  1-2 puff Inhalation Q4H PRN Ethelene Hal, NP   1 puff at 12/20/18 1821  . alum & mag hydroxide-simeth (MAALOX/MYLANTA) 200-200-20 MG/5ML suspension 30 mL  30 mL Oral Q4H PRN Patriciaann Clan E, PA-C      . ARIPiprazole ER (ABILIFY MAINTENA) injection 400 mg  400 mg Intramuscular Q28 days Ethelene Hal, NP   400 mg at 12/19/18 1138  . benztropine (COGENTIN) tablet 0.5 mg  0.5 mg Oral BID Ethelene Hal, NP   0.5 mg at 12/23/18 8416  . divalproex (DEPAKOTE) DR tablet 750 mg  750 mg Oral BID Sharma Covert, MD   750 mg at 12/23/18 6063  . hydrOXYzine (ATARAX/VISTARIL) tablet 25 mg  25 mg Oral Q6H PRN Laverle Hobby, PA-C   25 mg at 12/21/18 2229  . hydrOXYzine (ATARAX/VISTARIL) tablet 50 mg  50 mg Oral TID PRN Ethelene Hal, NP   50 mg at 12/23/18 0321  . risperiDONE (RISPERDAL M-TABS) disintegrating tablet 2 mg  2 mg Oral Q8H PRN Laverle Hobby, PA-C       And  . LORazepam (ATIVAN) tablet 1 mg  1 mg Oral PRN Patriciaann Clan E, PA-C       And  . ziprasidone (GEODON) injection 20 mg  20 mg Intramuscular PRN Patriciaann Clan E, PA-C      . magnesium hydroxide (MILK OF MAGNESIA) suspension 30 mL  30 mL Oral Daily PRN Patriciaann Clan E, PA-C      . risperiDONE (RISPERDAL) tablet 3 mg  3 mg Oral Daily Sharma Covert, MD   3 mg at 12/23/18 0160  . risperiDONE (RISPERDAL) tablet 6 mg  6 mg Oral QHS Sharma Covert, MD   6 mg at 12/22/18 2046  . traZODone (DESYREL) tablet 100 mg  100 mg Oral QHS PRN Ethelene Hal, NP   100 mg at 12/22/18 2046  . traZODone (DESYREL) tablet 50 mg  50 mg Oral QHS,MR X 1 Laverle Hobby, PA-C   50 mg at 12/22/18 2047  . ziprasidone (GEODON) injection 20 mg  20 mg Intramuscular Q12H PRN Ethelene Hal, NP        Lab Results: No results found for this or any previous visit (from the past 57 hour(s)).  Blood Alcohol level:  Lab Results  Component Value Date   ETH <10 10/04/2018   ETH <10 10/93/2355    Metabolic Disorder Labs: Lab Results  Component Value Date   HGBA1C 5.0 08/27/2018   MPG 96.8 08/27/2018   MPG 96.8 11/21/2017   Lab Results  Component Value Date   PROLACTIN 4.2 (L) 12/19/2018   PROLACTIN 7.5 08/27/2018   Lab Results  Component Value Date   CHOL 161 12/19/2018   TRIG 55 12/19/2018   HDL 53 12/19/2018   CHOLHDL 3.0 12/19/2018   VLDL 11 12/19/2018   LDLCALC 97 12/19/2018  LDLCALC 111 (H)  08/27/2018    Physical Findings: AIMS: Facial and Oral Movements Muscles of Facial Expression: None, normal Lips and Perioral Area: None, normal Jaw: None, normal Tongue: None, normal,Extremity Movements Upper (arms, wrists, hands, fingers): None, normal Lower (legs, knees, ankles, toes): None, normal, Trunk Movements Neck, shoulders, hips: None, normal, Overall Severity Severity of abnormal movements (highest score from questions above): None, normal Incapacitation due to abnormal movements: None, normal [name] awareness of abnormal movements (rate only patient's report): No Awareness, Dental Status Current problems with teeth and/or dentures?: No Does patient usually wear dentures?: No  CIWA:  CIWA-Ar Total: 0 COWS:     Musculoskeletal: Strength & Muscle Tone: within normal limits Gait & Station: normal Patient leans: N/A  Psychiatric Specialty Exam: Physical Exam  ROS  Blood pressure 110/87, pulse 97, temperature 97.7 F (36.5 C), temperature source Oral, resp. rate 20, height 5\' 8"  (1.727 m), weight 88.5 kg.Body mass index is 29.65 kg/m.  General Appearance: Casual  Eye Contact:  Good  Speech:  Clear and Coherent  Volume:  Normal  Mood:  Dysphoric  Affect:  Congruent  Thought Process:  Goal Directed and Linear  Orientation:  Full (Time, Place, and Person)  Thought Content:  Illogical and Tangential  Suicidal Thoughts:  No  Homicidal Thoughts:  No  Memory:  Recent;   Fair  Judgement:  Fair  Insight:  Fair  Psychomotor Activity:  Normal  Concentration:  Concentration: Fair  Recall:  [place] of Knowledge:  Good  Language:  Fair  Akathisia:  Negative  Handed:  Right  AIMS (if indicated):     Assets:  Social Support  ADL's:  Intact  Cognition:  WNL  Sleep:  Number of Hours: 6     Treatment Plan Summary: Daily contact with patient to assess and evaluate symptoms and progress in treatment and Medication management plan to discharge tomorrow has received  long-acting injectable continue cognitive therapy patient excepted she will be here till tomorrow but not argumentative denies hallucinations  [name], MD 12/23/2018, 12:47 PM

## 2018-12-23 NOTE — Progress Notes (Signed)
Pt up , a little anxious, manic, pt given 1 mg Ativan and 50 mg Vistaril per [facility]

## 2018-12-23 NOTE — Progress Notes (Signed)
Recreation Therapy Notes  Date: 4.27.20 Time: 1000 Location: 500 Hall Dayroom  Group Topic: Coping Skills  Goal Area(s) Addresses:  Patient will be able to identify positive coping strategies. Patient will be able to identify importance of using coping skills. Patient will identify benefits of using coping skills post d/c.  Intervention: Worksheet  Activity: Coping Skills A to Z.  Patients were given a worksheet with the alphabet from A to Z.  Patients were to identify a coping skill for each letter of the alphabet. Patients would then share some of their coping skills with the group.  Education: Radiographer, therapeutic, Dentist.   Education Outcome: Acknowledges understanding/In group clarification offered/Needs additional education.   Clinical Observations/Feedback: Pt did not attend group.     Victorino Sparrow, LRT/CTRS         Victorino Sparrow A 12/23/2018 11:44 AM

## 2018-12-23 NOTE — Progress Notes (Signed)
DAR NOTE: Patient presents with anxious affect and depressed mood.  Denies suicidal thoughts, pain, auditory and visual hallucinations.  Rates depression at 3.5, hopelessness at 0, and anxiety at 9.5.  Maintained on routine safety checks.  Medications given as prescribed.  Support and encouragement offered as needed.  Attended group and participated.  States goal for today is "attitude adjustment."  Patient visible in milieu talking to herself in [place] language.  Patient is safe on and off the unit. Preoccupied with getting discharge.  Offered no complaint.

## 2018-12-24 MED ORDER — FERROUS SULFATE 325 (65 FE) MG PO TABS: 325.0000 mg | ORAL_TABLET | Freq: Every day | ORAL | 1 refills | Status: DC

## 2018-12-24 MED ORDER — RISPERIDONE 3 MG PO TABS: ORAL_TABLET | ORAL | 2 refills | Status: DC

## 2018-12-24 MED ORDER — BENZTROPINE MESYLATE 0.5 MG PO TABS: 0.5000 mg | ORAL_TABLET | Freq: Two times a day (BID) | ORAL | 2 refills | Status: DC

## 2018-12-24 MED ORDER — DIVALPROEX SODIUM 250 MG PO DR TAB: 750.0000 mg | DELAYED_RELEASE_TABLET | Freq: Two times a day (BID) | ORAL | 2 refills | Status: DC

## 2018-12-24 NOTE — Progress Notes (Signed)
Recreation Therapy Notes  Date: 4.28.20 Time: 1000 Location: 500 Hall Dayroom  Group Topic: Triggers  Goal Area(s) Addresses:  Patient will identify biggest triggers. Patient will identify how triggers can be avoided. Patient will identify how to face triggers head on.  Behavioral Response: Engaged  Intervention: Worksheet  Activity:  Triggers.  Patient was to identify their 3 biggest triggers, how they avoid/reduse exposure to triggers and how they deal with them head on.  Education: Triggers, Discharge Planning  Education Outcome: Acknowledges understanding/In group clarification offered/Needs additional education.   Clinical Observations/Feedback:  Pt identified triggers as be killed or kill someone, embarrassment, running away; avoid/reduce exposure by remove it, leave where I'm at and ignore it; face head on by getting rid of it, go to authority and other means.    Victorino Sparrow, LRT/CTRS      Victorino Sparrow A 12/24/2018 11:48 AM
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
## 2018-12-24 NOTE — Progress Notes (Signed)
D: Pt continues to be disorganized, loose associations and flight of ideas but appears less manic this evening A: Pt was offered support and encouragement. Pt was given scheduled medications. Pt was encourage to attend groups. Q 15 minute checks were done for safety.  R:Pt attends groups and interacts well with peers and staff. Pt is taking medication. Pt has no complaints.Pt receptive to treatment and safety maintained on unit.

## 2018-12-24 NOTE — BHH Suicide Risk Assessment (Signed)
[place] INPATIENT:  Family/Significant Other Suicide Prevention Education  Suicide Prevention Education:  Contact Attempts: Damaris Hippo, uncle, [phone],  has been identified by the patient as the family member/significant other with whom the patient will be residing, and identified as the person(s) who will aid the patient in the event of a mental health crisis.  With written consent from the patient, two attempts were made to provide suicide prevention education, prior to and/or following the patient's discharge.  We were unsuccessful in providing suicide prevention education.  A suicide education pamphlet was given to the patient to share with family/significant other.  Date and time of first attempt:12/24/18, 0939 Date and time of second attempt:  Joanne Chars, LCSW 12/24/2018, 9:39 AM

## 2018-12-24 NOTE — BHH Suicide Risk Assessment (Signed)
[facility] Discharge Suicide Risk Assessment   Principal Problem: Exacerbation and underlying psychotic disorder Discharge Diagnoses: Active Problems:   Schizoaffective disorder, bipolar type ([place])   Total Time spent with patient: 45 minutes  ADL's:  Intact   Mental Status Per Nursing Assessment::   On Admission:  NA  Demographic Factors:  Unemployed  Loss Factors: NA  Historical Factors: NA  Risk Reduction Factors:   Sense of responsibility to family and Religious beliefs about death  Continued Clinical Symptoms:  Previous Psychiatric Diagnoses and Treatments  Cognitive Features That Contribute To Risk:  None    Suicide Risk:  Minimal: No identifiable suicidal ideation.  Patients presenting with no risk factors but with morbid ruminations; may be classified as minimal risk based on the severity of the depressive symptoms  Follow-up Information    [facility] Follow up on 12/31/2018.   Why:  Hospital follow up appointment is Tuesday, 5/5 at 3:00p.  The appointment will be over the phone and the provider will contact you.  Contact information: [address] [place] [address] [phone]           Plan Of Care/Follow-up recommendations:  Activity:  full  Mikenzie Mccannon, MD 12/24/2018, 11:41 AM

## 2018-12-24 NOTE — Progress Notes (Signed)
D: Pt A & O X 3. Present animated and pleasant on interactions. Denies SI, HI, AVH and pain at this time. D/C home as ordered. Pt used the bus to go home.  A: D/C instructions reviewed with pt including prescriptions, medication samples and follow up appointment; compliance encouraged. All belongings from locker # given to pt at time of departure. Scheduled and PRN medications given with verbal education and effects monitored. Safety checks maintained without incident till time of d/c.  R: Pt receptive to care. Compliant with medications when offered. Denies adverse drug reactions when assessed. Verbalized understanding related to d/c instructions. Signed belonging sheet in agreement with items received from locker. Ambulatory with a steady gait. Appears to be in no physical distress at time of departure.
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
## 2018-12-24 NOTE — BHH Suicide Risk Assessment (Signed)
[facility] INPATIENT:  Family/Significant Other Suicide Prevention Education  Suicide Prevention Education:  Contact Attempts: Trinetta Alemu, daughter, [phone] been identified by the patient as the family member/significant other with whom the patient will be residing, and identified as the person(s) who will aid the patient in the event of a mental health crisis.  With written consent from the patient, two attempts were made to provide suicide prevention education, prior to and/or following the patient's discharge.  We were unsuccessful in providing suicide prevention education.  A suicide education pamphlet was given to the patient to share with family/significant other.  Date and time of first attempt:12/24/18, 49 Date and time of second attempt:  Joanne Chars, LCSW 12/24/2018, 9:41 AM

## 2018-12-24 NOTE — BHH Suicide Risk Assessment (Signed)
[facility] INPATIENT:  Family/Significant Other Suicide Prevention Education  Suicide Prevention Education:  Education Completed;Jess Wetherington, mother, [phone],  has been identified by the patient as the family member/significant other with whom the patient will be residing, and identified as the person(s) who will aid the patient in the event of a mental health crisis (suicidal ideations/suicide attempt).  With written consent from the patient, the family member/significant other has been provided the following suicide prevention education, prior to the and/or following the discharge of the patient.  The suicide prevention education provided includes the following:  Suicide risk factors  Suicide prevention and interventions  National Suicide Hotline telephone number  [facility] assessment telephone number  [facility] Emergency Assistance [place] and/or Residential Mobile Crisis Unit telephone number  Request made of family/significant other to:  Remove weapons (e.g., guns, rifles, knives), all items previously/currently identified as safety concern.  No guns that mother is aware of.  Remove drugs/medications (over-the-counter, prescriptions, illicit drugs), all items previously/currently identified as a safety concern.  The family member/significant other verbalizes understanding of the suicide prevention education information provided.  The family member/significant other agrees to remove the items of safety concern listed above.  Pt has been involved in some illegal activity: tried to apply for unemployment using daughter's SSN, wrote two checks from [employer] without permission.    Joanne Chars, LCSW 12/24/2018, 9:44 AM

## 2018-12-24 NOTE — Plan of Care (Signed)
Pt did not identify any coping skills for SI.    Victorino Sparrow, LRT/CTRS

## 2018-12-24 NOTE — Progress Notes (Signed)
  [facility] Adult [facility] Management Discharge Plan :  Will you be returning to the same living situation after discharge:  Yes,  own home At discharge, do you have transportation home?: No. PT will ride bus, no bus fare currently due to virus. Do you have the ability to pay for your medications: Yes,  medicare/medicaid  Release of information consent forms completed and in the chart;  [name] signature needed at discharge.  Patient to Follow up at: Follow-up Information    [facility] Follow up on 12/31/2018.   Why:  Hospital follow up appointment is Tuesday, 5/5 at 3:00p.  The appointment will be over the phone and the provider will contact you.  Contact information: [address] [place] [address] [phone]           Next level of care provider has access to [place] and Suicide Prevention discussed: Yes,  with mother  Have you used any form of tobacco in the last 30 days? (Cigarettes, Smokeless Tobacco, Cigars, and/or Pipes): No  Has patient been referred to the Quitline?: N/A patient is not a smoker  Patient has been referred for addiction treatment: N/A  Joanne Chars, LCSW 12/24/2018, 9:54 AM

## 2018-12-24 NOTE — Progress Notes (Signed)
Recreation Therapy Notes  INPATIENT RECREATION TR PLAN  Patient Details Name: [name] MRN: [id number] DOB: 10-29-1970 Today's Date: 12/24/2018  Rec Therapy Plan Is patient appropriate for Therapeutic Recreation?: Yes Treatment times per week: about 3 days Estimated Length of Stay: 5-7 days TR Treatment/Interventions: Group participation (Comment)  Discharge Criteria Pt will be discharged from therapy if:: Discharged Treatment plan/goals/alternatives discussed and agreed upon by:: Patient/family  Discharge Summary Short term goals set: See patient care plan Short term goals met: Not met Progress toward goals comments: Groups attended Which groups?: Other (Comment)(Triggers) Reason goals not met: Pt did not attend coping skills group. Therapeutic equipment acquired: N/A Reason patient discharged from therapy: Discharge from hospital Pt/family agrees with progress & goals achieved: Yes Date patient discharged from therapy: 12/24/18    Victorino Sparrow, LRT/CTRS  Ria Comment, Shawnell Dykes A 12/24/2018, 12:11 PM

## 2019-02-04 ENCOUNTER — Observation Stay (HOSPITAL_COMMUNITY)
Admission: EM | Admit: 2019-02-04 | Discharge: 2019-02-05 | Disposition: A | Discharge status: Other Acute Inpatient Hospital | Payer: Medicare Other | Attending: Psychiatry | Admitting: Psychiatry

## 2019-02-04 ENCOUNTER — Other Ambulatory Visit: Payer: Self-pay

## 2019-02-04 ENCOUNTER — Other Ambulatory Visit: Payer: Self-pay | Admitting: Behavioral Health

## 2019-02-04 ENCOUNTER — Encounter (HOSPITAL_COMMUNITY): Payer: Self-pay | Admitting: Clinical

## 2019-02-04 DIAGNOSIS — F411 Generalized anxiety disorder: Secondary | ICD-10-CM | POA: Insufficient documentation

## 2019-02-04 DIAGNOSIS — F209 Schizophrenia, unspecified: Secondary | ICD-10-CM | POA: Diagnosis present

## 2019-02-04 DIAGNOSIS — F319 Bipolar disorder, unspecified: Secondary | ICD-10-CM | POA: Diagnosis not present

## 2019-02-04 MED ORDER — FERROUS SULFATE 325 (65 FE) MG PO TABS
325.0000 mg | ORAL_TABLET | Freq: Every day | ORAL | Status: DC
  Administered 2019-02-05: 325 mg via ORAL
  Filled 2019-02-04 (×3): qty 1

## 2019-02-04 MED ORDER — ALUM & MAG HYDROXIDE-SIMETH 200-200-20 MG/5ML PO SUSP: 30.0000 mL | ORAL | Status: DC | PRN

## 2019-02-04 MED ORDER — RISPERIDONE 3 MG PO TABS
3.0000 mg | ORAL_TABLET | Freq: Every day | ORAL | Status: DC
  Administered 2019-02-05: 3 mg via ORAL
  Filled 2019-02-04: qty 1

## 2019-02-04 MED ORDER — DIVALPROEX SODIUM 500 MG PO DR TAB
750.0000 mg | DELAYED_RELEASE_TABLET | Freq: Two times a day (BID) | ORAL | Status: DC
  Administered 2019-02-04 – 2019-02-05 (×2): 750 mg via ORAL
  Filled 2019-02-04 (×2): qty 1

## 2019-02-04 MED ORDER — RISPERIDONE 3 MG PO TABS
6.0000 mg | ORAL_TABLET | Freq: Every day | ORAL | Status: DC
  Administered 2019-02-04: 6 mg via ORAL
  Filled 2019-02-04: qty 2

## 2019-02-04 MED ORDER — BENZTROPINE MESYLATE 0.5 MG PO TABS
0.5000 mg | ORAL_TABLET | Freq: Two times a day (BID) | ORAL | Status: DC
  Administered 2019-02-04 – 2019-02-05 (×2): 0.5 mg via ORAL
  Filled 2019-02-04 (×2): qty 1

## 2019-02-04 MED ORDER — ALBUTEROL SULFATE HFA 108 (90 BASE) MCG/ACT IN AERS: 2.0000 | INHALATION_SPRAY | Freq: Four times a day (QID) | RESPIRATORY_TRACT | Status: DC | PRN

## 2019-02-04 MED ORDER — TRAZODONE HCL 100 MG PO TABS
100.0000 mg | ORAL_TABLET | Freq: Every evening | ORAL | Status: DC | PRN
  Administered 2019-02-04: 100 mg via ORAL
  Filled 2019-02-04: qty 1

## 2019-02-04 MED ORDER — MOMETASONE FURO-FORMOTEROL FUM 200-5 MCG/ACT IN AERO
2.0000 | INHALATION_SPRAY | Freq: Two times a day (BID) | RESPIRATORY_TRACT | Status: DC
  Administered 2019-02-04 – 2019-02-05 (×2): 2 via RESPIRATORY_TRACT
  Filled 2019-02-04: qty 8.8

## 2019-02-04 MED ORDER — ACETAMINOPHEN 325 MG PO TABS: 650.0000 mg | ORAL_TABLET | Freq: Four times a day (QID) | ORAL | Status: DC | PRN

## 2019-02-04 NOTE — Progress Notes (Signed)
Patient ID: [name], female   DOB: July 02, 1971, 48 y.o.   MRN: [id number] Patient admitted due to increased psychosis, disorganized thoughts and SI. Patient was too tired to engaged Probation officer in admission. Skin assessment done patient found to be free of all injury and contraband.  Patient admitted to the unit without incident.

## 2019-02-04 NOTE — H&P (Signed)
Behavioral Health Medical Screening Exam  [name] is an 48 y.o. female who presented as a walk-in to [facility], voluntarily, accompanied alone. Patient is a very poor historian. She appears very psychotic but non-aggressive. When asked for her reason for seeking help and if she was expecting any hallucinations she replied, " I don' care. I really don't care. They will let me know." She states, " all I think about is children. The fucking white people. Something is going to happen to them." She is unable to say if she is suicidal or homicidal. Her thoughts are disorganized with  tangential. loose associations. She rambles about many conversations some of which are unable to make out. She has a history of  Schizophrenia, Bipolar d/o, GAD  and was discharged from [facility] 12/19/2018. Per review of chart, she has multiple psychiatric hospitalizations. She states she is complaint with current medications although she is not able to verify them. Because she appears to be severally psychotic, inpatient recommendation is current disposition.      Total Time spent with patient: 15 minutes  Psychiatric Specialty Exam: Physical Exam  Nursing note and vitals reviewed. Constitutional: She is oriented to person, place, and time.  Neurological: She is alert and oriented to person, place, and time.    Review of Systems  Psychiatric/Behavioral: Positive for hallucinations and suicidal ideas.  All other systems reviewed and are negative.   Blood pressure 125/89, pulse 97, temperature 99.1 F (37.3 C), temperature source Oral, resp. rate 16, SpO2 99 %.There is no height or weight on file to calculate BMI.  General Appearance: Casual  Eye Contact:  Fair  Speech:  Blocked  Volume:  Normal  Mood:  Anxious  Affect:  Full Range  Thought Process:  Disorganized and Descriptions of Associations: Tangential  Orientation:  NA  Thought Content:  Tangential  Suicidal Thoughts:  unable to access   Homicidal Thoughts:   unable to access due to severe psychosis   Memory:  Immediate;   Poor  Judgement:  Fair  Insight:  Fair  Psychomotor Activity:  Normal  Concentration: Concentration: Poor  Recall:  Poor  Fund of Knowledge:Poor  Language: Fair  Akathisia:  Negative  Handed:  Right  AIMS (if indicated):     Assets:  Desire for Improvement  Sleep:       Musculoskeletal: Strength & Muscle Tone: within normal limits Gait & Station: normal Patient leans: N/A  Blood pressure 125/89, pulse 97, temperature 99.1 F (37.3 C), temperature source Oral, resp. rate 16, SpO2 99 %.  Recommendations:  Based on my evaluation the patient does not appear to have an emergency medical condition.   There is evidence of imminent risk to self or others at present.   Patient does meet criteria for psychiatric inpatient admission. Mordecai Maes, NP 02/04/2019, 3:51 PM

## 2019-02-04 NOTE — Progress Notes (Signed)
Pt meets inpatient criteria per Mordecai Maes, NP. Referral information has been sent to the following hospitals for review: [place] [facility] will continue to assist with pt's inpatient placement needs.   Audree Camel, LCSW, [place] Disposition [place] [facility] BHH/TTS [phone]

## 2019-02-04 NOTE — Progress Notes (Signed)
D:  [name] was asleep at the beginning of the shift.  She did wake up and provided urine sample.  She denied SI/HI or A/V hallucinations.  She had fixed smile on her face but was pleasant and cooperative.  Speech was tangential and rapid.  She denied any pain or discomfort and appeared to be in no physical distress.  She took her hs medications without difficulty.  RN unable to obtain EKG as she is currently resting with her eyes closed and appears to be asleep. A:  1:1 with RN for support and encouragement.  Medications as ordered.  Q 15 minute checks maintained for safety.  R:  Lyann remains safe on the unit.  We will continue to monitor the progress towards her goals.

## 2019-02-04 NOTE — BH Assessment (Signed)
Assessment Note  [name] is an 48 y.o. female presenting to [facility] for assessment requesting in patient admission. Patient is a poor historian due to [place]. She is unable to describe current symptoms. Chart review was used in conjunction with patient interview to complete assessment. Patient states "I'm just mad. I need help." Patient then rambled nonsensically about medications, injections, and Angelena Sole. Patient observed to be sexually preoccupied as she makes numerous sexual remarks. [name] speech is pressured and thoughts are tangential. Patient states she does not have any outpatient providers. She states she lives alone and "prefers it that way." When asked about SI she stated yes but was unable to give specifics about intent or plan. Per chart review patient has a history of schizoaffective disorder, bipolar type and has had numerous admissions to [facility] for psychotic symptoms. Patient does not have a history of substance abuse, suicide attempts, or violent behavior per documentation. Per history patient is seen at [facility] for medication management.  Patient is alert and oriented x 2. She appears bizarre. [name] speech is pressured, eye contact is poor, and thoughts are disorganized. Her mood is irritable and affect is congruent. Her insight, judgement and impulse control appear impaired.  Diagnosis: F25.0 Schizoaffective disorder, bipolar type  Past Medical History:  Past Medical History:  Diagnosis Date  . Anemia   . Anxiety   . Asthma   . Bipolar 1 disorder ([place])   . Breast cancer ([place])   . Depression   . Diabetes mellitus without complication ([place])   . Hypertension   . Insomnia, persistent   . Schizophrenic disorder ([place])   . Seizures ([place])     Past Surgical History:  Procedure Laterality Date  . BREAST SURGERY Left   . TONSILLECTOMY      Family History:  Family History  Problem Relation Age of Onset  . Depression Mother   . Gout Mother   . Cancer Father         prostate  . Other Father        lung issue  . Alcoholism Other   . Heart attack Paternal Grandfather   . Heart attack Paternal Grandmother   . Heart attack Maternal Grandmother   . Heart attack Maternal Grandfather   . Depression Son   . Anxiety disorder Son     Social History:  reports that she has quit smoking. Her smoking use included cigarettes. She has never used smokeless tobacco. She reports current alcohol use. She reports that she does not use drugs.  Additional Social History:  Alcohol / Drug Use Pain Medications: see MAR Prescriptions: see MAR Over the Counter: see MAR History of alcohol / drug use?: No history of alcohol / drug abuse  CIWA: CIWA-Ar BP: 125/89 Pulse Rate: 97 COWS:    Allergies:  Allergies  Allergen Reactions  . Haldol [Haloperidol Decanoate] Other (See Comments)    Stiffness, eyes bulging  . Haloperidol   . Penicillins Nausea And Vomiting    Has patient had a PCN reaction causing immediate rash, facial/tongue/throat swelling, SOB or lightheadedness with hypotension:UNSURE  Has patient had a PCN reaction causing severe rash involving mucus membranes or skin necrosis: UNSURE Has patient had a PCN reaction that required hospitalization:UNSURE Has patient had a PCN reaction occurring within the last 10 years:No If all of the above answers are "NO", then may proceed with Cephalosporin use. CHILDHOOD REACTION  . Pollen Extract Other (See Comments)    Seasonal allergies  . Shrimp [Shellfish Allergy] Rash  Home Medications: (Not in a hospital admission)   OB/GYN Status:  No LMP recorded.  General Assessment Data Location of Assessment: [facility] Assessment Services TTS Assessment: In system Is this a Tele or Face-to-Face Assessment?: Face-to-Face Is this an Initial Assessment or a Re-assessment for this encounter?: Initial Assessment Patient Accompanied by:: N/A Language Other than English: No Living Arrangements: Other (Comment)(apartment) What  gender do you identify as?: Female Marital status: Separated Pregnancy Status: No Living Arrangements: Alone Can pt return to current living arrangement?: Yes Admission Status: Voluntary Is patient capable of signing voluntary admission?: No Referral Source: Self/Family/Friend Insurance type: Medicaid  Medical Screening Exam ([place]) Medical Exam completed: Yes  Crisis Care Plan Living Arrangements: Alone Legal Guardian: (self) Name of Psychiatrist: [place] Name of Therapist: None  Education Status Is patient currently in school?: No Highest grade of school patient has completed: Some college, received an associates degree then did some more classes. Is the patient employed, unemployed or receiving disability?: Receiving disability income  Risk to self with the past 6 months Suicidal Ideation: Yes-Currently Present Has patient been a risk to self within the past 6 months prior to admission? : Yes Suicidal Intent: No-Not Currently/Within Last 6 Months Has patient had any suicidal intent within the past 6 months prior to admission? : Yes Is patient at risk for suicide?: Yes Suicidal Plan?: No-Not Currently/Within Last 6 Months Has patient had any suicidal plan within the past 6 months prior to admission? : Yes Specify Current Suicidal Plan: UTA Access to Means: No Specify Access to Suicidal Means: None What has been your use of drugs/alcohol within the last 12 months?: denies Previous Attempts/Gestures: Yes How many times?: (UTA) Other Self Harm Risks: none noted Triggers for Past Attempts: None known Intentional Self Injurious Behavior: None Family Suicide History: No Recent stressful life event(s): Turmoil (Comment)(stressed about current events) Persecutory voices/beliefs?: No Depression: Yes Depression Symptoms: Feeling angry/irritable, Feeling worthless/self pity, Loss of interest in usual pleasures, Isolating, Despondent Substance abuse history and/or  treatment for substance abuse?: No Suicide prevention information given to non-admitted patients: Not applicable  Risk to Others within the past 6 months Homicidal Ideation: No Does patient have any lifetime risk of violence toward others beyond the six months prior to admission? : No Thoughts of Harm to Others: No Current Homicidal Intent: No Current Homicidal Plan: No Access to Homicidal Means: No Identified Victim: none History of harm to others?: No Assessment of Violence: None Noted Violent Behavior Description: none noted Does patient have access to weapons?: No Criminal Charges Pending?: No Does patient have a court date: No Is patient on probation?: No  Psychosis Hallucinations: Visual Delusions: Persecutory  Mental Status Report Appearance/Hygiene: Bizarre Eye Contact: Poor Motor Activity: Freedom of movement Speech: Pressured, Tangential Level of Consciousness: Alert Mood: Irritable Affect: Irritable Anxiety Level: Moderate Thought Processes: Flight of Ideas Judgement: Impaired Orientation: Person, Place, Time, Situation Obsessive Compulsive Thoughts/Behaviors: None  Cognitive Functioning Concentration: Poor Memory: Unable to Assess Is patient IDD: No Insight: Poor Impulse Control: Poor Appetite: (UTA) Have you had any weight changes? : (UTA) Sleep: Unable to Assess Vegetative Symptoms: None  ADLScreening [facility] Assessment Services) [name] cognitive ability adequate to safely complete daily activities?: Yes Patient able to express need for assistance with ADLs?: Yes Independently performs ADLs?: Yes (appropriate for developmental age)  Prior Inpatient Therapy Prior Inpatient Therapy: Yes Prior Therapy Dates: 09/2018, 2019, 2018 Prior Therapy [facility]ilty/Provider(s[facility] Reason for Treatment: schizophrenia  Prior Outpatient Therapy Prior Outpatient Therapy:  Yes Prior Therapy Dates: Current Prior Therapy Facilty/Provider(s):  [facility] Reason for Treatment: med management Does patient have an ACCT team?: No Does patient have Intensive In-House Services?  : No Does patient have [facility] services? : Yes Does patient have P4CC services?: No  ADL Screening (condition at time of admission) [name] cognitive ability adequate to safely complete daily activities?: Yes Is the patient deaf or have difficulty hearing?: No Does the patient have difficulty seeing, even when wearing glasses/contacts?: No Does the patient have difficulty concentrating, remembering, or making decisions?: Yes Patient able to express need for assistance with ADLs?: Yes Does the patient have difficulty dressing or bathing?: No Independently performs ADLs?: Yes (appropriate for developmental age) Does the patient have difficulty walking or climbing stairs?: No Weakness of Legs: None Weakness of Arms/Hands: None  Home Assistive Devices/Equipment Home Assistive Devices/Equipment: None  Therapy Consults (therapy consults require a physician order) PT Evaluation Needed: No OT Evalulation Needed: No SLP Evaluation Needed: No Abuse/Neglect Assessment (Assessment to be complete while patient is alone) Abuse/Neglect Assessment Can Be Completed: Unable to assess, patient is non-responsive or altered mental status Values / Beliefs Cultural Requests During Hospitalization: None Spiritual Requests During Hospitalization: None Consults Spiritual Care Consult Needed: No Social Work Consult Needed: No Regulatory affairs officer (For Healthcare) Does Patient Have a Medical Advance Directive?: No Would patient like information on creating a medical advance directive?: No - Patient declined          Disposition: Mordecai Maes, NP recommends in patient treatment. No available 500 hall beds. Patient admitted to OBS to await placement. Disposition Initial Assessment Completed for this Encounter: Yes Disposition of Patient: Admit Type of inpatient  treatment program: Adult([facility]) Patient refused recommended treatment: No  On Site Evaluation by:   Reviewed with Physician:    Orvis Brill 02/04/2019 4:00 PM

## 2019-02-04 NOTE — H&P (Signed)
[facility] Provider Admission PAA/H&P  Patient Identification: [name] MRN:  [id number] Date of Evaluation:  02/04/2019 Chief Complaint:  Schizophrenia Principal Diagnosis: Schizophrenia ([place]) Diagnosis:  Principal Problem:   Schizophrenia ([place])  History of Present Illness: [name] is an 48 y.o. female who presented as a walk-in to [facility], voluntarily, accompanied alone. Patient is a very poor historian. She appears very psychotic but non-aggressive. When asked for her reason for seeking help and if she was expecting any hallucinations she replied, " I don' care. I really don't care. They will let me know." She states, " all I think about is children. The fucking white people. Something is going to happen to them." She is unable to say if she is suicidal or homicidal. Her thoughts are disorganized with  tangential.loose associations. She rambles about many conversations some of which are unable to make out. She has a history of  Schizophrenia, Bipolar d/o, GAD and was discharged from [facility] 12/19/2018. Per review of chart, she has multiple psychiatric hospitalizations. She states she is complaint with current medications although she is not able to verify them. Per review of chart, her discharge medications from [place] on 12/19/2018 was Risperidone 3 mg bid, Depakote 750 BID, Abilfy Maintena ER 400 mg, Cogentin 0.5 mg po BID. She denies substance abuse.      Associated Signs/Symptoms: Depression Symptoms:  denies (Hypo) Manic Symptoms:  Hallucinations, Anxiety Symptoms:  denies Psychotic Symptoms:  Hallucinations: Auditory PTSD Symptoms: NA Total Time spent with patient: 20 minutes  Past Psychiatric History: Schizophrenia, Bipolar d/o, GAD. Multiple psychiatrics hospitalizations.   Is the patient at risk to self? Yes.    Has the patient been a risk to self in the past 6 months? Yes.    Has the patient been a risk to self within the distant past? Yes.    Is the patient a  risk to others? No.  Has the patient been a risk to others in the past 6 months? No.  Has the patient been a risk to others within the distant past? No.   Prior Inpatient Therapy: Prior Inpatient Therapy: Yes Prior Therapy Dates: 09/2018, 2019, 2018 Prior [facility]Provider([facility] Reason for Treatment: schizophrenia Prior Outpatient Therapy: Prior Outpatient Therapy: Yes Prior Therapy Dates: Current Prior Therapy Facilty/Provider(s): [facility] Reason for Treatment: med management Does patient have an ACCT team?: No Does patient have Intensive In-House Services?  : No Does patient have [facility] services? : Yes Does patient have P4CC services?: No  Alcohol Screening:   Substance Abuse History in the last 12 months:  No. Consequences of Substance Abuse: NA Previous Psychotropic Medications: Yes  Psychological Evaluations: Yes  Past Medical History:  Past Medical History:  Diagnosis Date  . Anemia   . Anxiety   . Asthma   . Bipolar 1 disorder ([place])   . Breast cancer ([place])   . Depression   . Diabetes mellitus without complication ([place])   . Hypertension   . Insomnia, persistent   . Schizophrenic disorder ([place])   . Seizures ([place])     Past Surgical History:  Procedure Laterality Date  . BREAST SURGERY Left   . TONSILLECTOMY     Family History:  Family History  Problem Relation Age of Onset  . Depression Mother   . Gout Mother   . Cancer Father        prostate  . Other Father        lung issue  . Alcoholism Other   .  Heart attack Paternal Grandfather   . Heart attack Paternal Grandmother   . Heart attack Maternal Grandmother   . Heart attack Maternal Grandfather   . Depression Son   . Anxiety disorder Son    Family Psychiatric History: Son- MDD, GAD Tobacco Screening:   Social History:  Social History   Substance and Sexual Activity  Alcohol Use Yes   Comment: BAC not available at time of assessment     Social History   Substance and  Sexual Activity  Drug Use No    Additional Social History: Marital status: Separated    Pain Medications: see MAR Prescriptions: see MAR Over the Counter: see MAR History of alcohol / drug use?: No history of alcohol / drug abuse                    Allergies:   Allergies  Allergen Reactions  . Haldol [Haloperidol Decanoate] Other (See Comments)    Stiffness, eyes bulging  . Haloperidol   . Penicillins Nausea And Vomiting    Has patient had a PCN reaction causing immediate rash, facial/tongue/throat swelling, SOB or lightheadedness with hypotension:UNSURE  Has patient had a PCN reaction causing severe rash involving mucus membranes or skin necrosis: UNSURE Has patient had a PCN reaction that required hospitalization:UNSURE Has patient had a PCN reaction occurring within the last 10 years:No If all of the above answers are "NO", then may proceed with Cephalosporin use. CHILDHOOD REACTION  . Pollen Extract Other (See Comments)    Seasonal allergies  . Shrimp [Shellfish Allergy] Rash   Lab Results: No results found for this or any previous visit (from the past 48 hour(s)).  Blood Alcohol level:  Lab Results  Component Value Date   ETH <10 10/04/2018   ETH <10 16/05/9603    Metabolic Disorder Labs:  Lab Results  Component Value Date   HGBA1C 5.0 08/27/2018   MPG 96.8 08/27/2018   MPG 96.8 11/21/2017   Lab Results  Component Value Date   PROLACTIN 4.2 (L) 12/19/2018   PROLACTIN 7.5 08/27/2018   Lab Results  Component Value Date   CHOL 161 12/19/2018   TRIG 55 12/19/2018   HDL 53 12/19/2018   CHOLHDL 3.0 12/19/2018   VLDL 11 12/19/2018   LDLCALC 97 12/19/2018   LDLCALC 111 (H) 08/27/2018    Current Medications: No current facility-administered medications for this encounter.    PTA Medications: Medications Prior to Admission  Medication Sig Dispense Refill Last Dose  . albuterol (PROVENTIL HFA;VENTOLIN HFA) 108 (90 Base) MCG/ACT inhaler Inhale 2  puffs into the lungs every 6 (six) hours as needed for wheezing or shortness of breath. 1 Inhaler 0   . ARIPiprazole ER (ABILIFY MAINTENA) 400 MG SRER injection Inject 2 mLs (400 mg total) into the muscle every 28 (twenty-eight) days. Due on 11-06-18 1 each 0   . benztropine (COGENTIN) 0.5 MG tablet Take 1 tablet (0.5 mg total) by mouth 2 (two) times daily. 60 tablet 2   . budesonide-formoterol (SYMBICORT) 160-4.5 MCG/ACT inhaler Inhale 2 puffs into the lungs 2 (two) times daily. For Shortness of breath 1 Inhaler 0   . divalproex (DEPAKOTE) 250 MG DR tablet Take 3 tablets (750 mg total) by mouth 2 (two) times a day. 180 tablet 2   . ferrous sulfate 325 (65 FE) MG tablet Take 1 tablet (325 mg total) by mouth daily with breakfast. 90 tablet 1   . risperiDONE (RISPERDAL) 3 MG tablet 1 in am 2 at  hs 90 tablet 2   . traZODone (DESYREL) 100 MG tablet Take 1 tablet (100 mg total) by mouth at bedtime as needed for sleep. 30 tablet 0     Musculoskeletal: Strength & Muscle Tone: within normal limits Gait & Station: normal Patient leans: N/A  Psychiatric Specialty Exam: Physical Exam  Nursing note and vitals reviewed. Constitutional: She is oriented to person, place, and time.  Neurological: She is alert and oriented to person, place, and time.    Review of Systems  Psychiatric/Behavioral: Positive for hallucinations and suicidal ideas.  All other systems reviewed and are negative.   Blood pressure 125/89, pulse 97, temperature 99.1 F (37.3 C), temperature source Oral, resp. rate 16, SpO2 99 %.There is no height or weight on file to calculate BMI.  General Appearance: Disheveled  Eye Contact:  Fair  Speech:  Blocked  Volume:  Normal  Mood:  Anxious  Affect:  Full Range  Thought Process:  Disorganized and Descriptions of Associations: Tangential  Orientation:  NA  Thought Content:  Hallucinations: Auditory and Tangential  Suicidal Thoughts:  unable to access due to severe psychosis    Homicidal Thoughts:  unable to access due to severe psychosis   Memory:  Immediate;   Poor  Judgement:  Impaired  Insight:  Fair  Psychomotor Activity:  Normal  Concentration:  Concentration: Poor  Recall:  Poor  Fund of Knowledge:  Poor  Language:  Fair  Akathisia:  Negative  Handed:  Right  AIMS (if indicated):     Assets:  Desire for Improvement  ADL's:  Intact  Cognition:  WNL  Sleep:         Treatment Plan Summary: Daily contact with patient to assess and evaluate symptoms and progress in treatment  Observation Level/Precautions:  15 minute checks Laboratory: Prolactin, CBC, CMP, EKG, Valporic acid level .  Medications: Requires verification  Estimated LOS: Until stabilization has been achieved.       Mordecai Maes, NP 6/9/20204:07 PM

## 2019-02-04 NOTE — Progress Notes (Signed)
Pt accepted to [facility], main campus  Dr. Jonelle Sports is the attending/accepting provider.  Call report to [phone] [facility] OBS notified.    Pt is currently voluntary but will need to be involuntary committed tomorrow prior to transport if her symptoms of psychosis do not improve Pt is scheduled to arrive at [facility] on 02/05/19 after [place], [place], [place] Disposition [facility] [phone]

## 2019-02-05 DIAGNOSIS — F209 Schizophrenia, unspecified: Secondary | ICD-10-CM | POA: Diagnosis not present

## 2019-02-05 LAB — COMPREHENSIVE METABOLIC PANEL
ALT: 12 U/L (ref 0–44)
AST: 13 U/L — ABNORMAL LOW (ref 15–41)
Albumin: 3.7 g/dL (ref 3.5–5.0)
Alkaline Phosphatase: 52 U/L (ref 38–126)
Anion gap: 8 (ref 5–15)
BUN: 8 mg/dL (ref 6–20)
CO2: 22 mmol/L (ref 22–32)
Calcium: 9 mg/dL (ref 8.9–10.3)
Chloride: 112 mmol/L — ABNORMAL HIGH (ref 98–111)
Creatinine, Ser: 0.72 mg/dL (ref 0.44–1.00)
GFR calc Af Amer: >60 mL/min (ref >60)
GFR calc non Af Amer: >60 mL/min (ref >60)
Glucose, Bld: 93 mg/dL (ref 70–99)
Potassium: 3.8 mmol/L (ref 3.5–5.1)
Sodium: 142 mmol/L (ref 135–145)
Total Bilirubin: 0.3 mg/dL (ref 0.3–1.2)
Total Protein: 6.7 g/dL (ref 6.5–8.1)

## 2019-02-05 LAB — RAPID URINE DRUG SCREEN, HOSP PERFORMED
Amphetamines: NOT DETECTED
Barbiturates: NOT DETECTED
Benzodiazepines: NOT DETECTED
Cocaine: NOT DETECTED
Opiates: NOT DETECTED
Tetrahydrocannabinol: NOT DETECTED

## 2019-02-05 LAB — CBC
HCT: 34.5 % — ABNORMAL LOW (ref 36.0–46.0)
Hemoglobin: 11 g/dL — ABNORMAL LOW (ref 12.0–15.0)
MCH: 29.8 pg (ref 26.0–34.0)
MCHC: 31.9 g/dL (ref 30.0–36.0)
MCV: 93.5 fL (ref 80.0–100.0)
Platelets: 215 10*3/uL (ref 150–400)
RBC: 3.69 MIL/uL — ABNORMAL LOW (ref 3.87–5.11)
RDW: 13.2 % (ref 11.5–15.5)
WBC: 4.5 10*3/uL (ref 4.0–10.5)
nRBC: 0 % (ref 0.0–0.2)

## 2019-02-05 LAB — PREGNANCY, URINE: Preg Test, Ur: NEGATIVE

## 2019-02-05 NOTE — BH Assessment (Addendum)
[facility] Assessment Progress Note  This voluntary pt was accepted to [facility] last night.  For details, please see note authored by Audree Camel, [place] on 02/04/2019 at 8:52 PM.  Pt's nurse, Edd Arbour, has agreed to discuss this with pt.  After conversation she reports to me that pt agrees to transfer, but only after much equivocation.  She also was reportedly very hesitant about signing consent for admission to the observation unit upon arrival.  In the context of pt's persecutory delusions, pt is at high risk to refuse treatment upon arrival at [facility].  After discussing this with Buford Dresser, DO, it has been determined that pt meets criteria for IVC, which she has initiated.  IVC documents have been faxed to [facility], and at [place] confirms receipt.  He has since faxed Findings and Custody Order to this Probation officer.  At 11:54 I called [employer] and spoke to [employer], who took demographic information, agreeing to dispatch law enforcement to fill out Return of Service.  [place] police then presented at [facility], completing Return of Service.  Pt is to be transported via [facility].  At 12:57 I called [facility] to notify them of pt's change of status.  I spoke to Christy Sartorius who agrees to watch for IVC documents that I will be faxing to him.  He reports that pt is to go to the [place], and that the number for report is [phone].  Pt's nurse, Edd Arbour, has been notified.  As of this writing, IVC documents are in the process of being faxed to [facility].  [place] Coordinator [phone]   Addendum:  At 13:09 Christy Sartorius confirms receipt of IVC documents.  Jalene Mullet, [place] Coordinator [phone]

## 2019-02-05 NOTE — Discharge Summary (Addendum)
  Patient transferring to [facility] for inpatient psychiatric treatment  Shuvon B. Rankin, NP  Patient seen face-to-face for psychiatric evaluation, chart reviewed and case discussed with the physician extender and developed treatment plan. Reviewed the information documented and agree with the treatment plan.  Buford Dresser, DO 02/05/19 1:51 PM

## 2019-02-05 NOTE — Progress Notes (Signed)
Patient ID: [name], female   DOB: Nov 10, 1970, 48 y.o.   MRN: [id number] Patient transferred to [facility] center in the [place].

## 2019-02-05 NOTE — Progress Notes (Signed)
Report called to [facility].   Transportation arranged with [employer].

## 2019-02-07 DIAGNOSIS — J45998 Other asthma: Secondary | ICD-10-CM | POA: Diagnosis not present

## 2019-02-07 DIAGNOSIS — E119 Type 2 diabetes mellitus without complications: Secondary | ICD-10-CM | POA: Diagnosis not present

## 2019-02-07 DIAGNOSIS — I10 Essential (primary) hypertension: Secondary | ICD-10-CM | POA: Diagnosis not present

## 2019-03-28 ENCOUNTER — Emergency Department (HOSPITAL_COMMUNITY)
Admission: EM | Admit: 2019-03-28 | Discharge: 2019-03-28 | Disposition: A | Discharge status: Home / Self Care | Payer: Medicare Other | Attending: Emergency Medicine | Admitting: Emergency Medicine

## 2019-03-28 ENCOUNTER — Encounter (HOSPITAL_COMMUNITY): Payer: Self-pay

## 2019-03-28 ENCOUNTER — Other Ambulatory Visit: Payer: Self-pay

## 2019-03-28 DIAGNOSIS — E119 Type 2 diabetes mellitus without complications: Secondary | ICD-10-CM | POA: Diagnosis not present

## 2019-03-28 DIAGNOSIS — Z79899 Other long term (current) drug therapy: Secondary | ICD-10-CM | POA: Insufficient documentation

## 2019-03-28 DIAGNOSIS — I10 Essential (primary) hypertension: Secondary | ICD-10-CM | POA: Diagnosis not present

## 2019-03-28 DIAGNOSIS — F329 Major depressive disorder, single episode, unspecified: Secondary | ICD-10-CM

## 2019-03-28 DIAGNOSIS — Z9114 Patient's other noncompliance with medication regimen: Secondary | ICD-10-CM | POA: Insufficient documentation

## 2019-03-28 DIAGNOSIS — Z87891 Personal history of nicotine dependence: Secondary | ICD-10-CM | POA: Insufficient documentation

## 2019-03-28 DIAGNOSIS — F32A Depression, unspecified: Secondary | ICD-10-CM

## 2019-03-28 DIAGNOSIS — F250 Schizoaffective disorder, bipolar type: Secondary | ICD-10-CM | POA: Insufficient documentation

## 2019-03-28 LAB — I-STAT BETA HCG BLOOD, ED (MC, WL, AP ONLY): I-stat hCG, quantitative: <5 m[IU]/mL (ref <5)

## 2019-03-28 LAB — COMPREHENSIVE METABOLIC PANEL
ALT: 10 U/L (ref 0–44)
AST: 10 U/L — ABNORMAL LOW (ref 15–41)
Albumin: 3.6 g/dL (ref 3.5–5.0)
Alkaline Phosphatase: 39 U/L (ref 38–126)
Anion gap: 10 (ref 5–15)
BUN: 14 mg/dL (ref 6–20)
CO2: 23 mmol/L (ref 22–32)
Calcium: 8.8 mg/dL — ABNORMAL LOW (ref 8.9–10.3)
Chloride: 108 mmol/L (ref 98–111)
Creatinine, Ser: 0.76 mg/dL (ref 0.44–1.00)
GFR calc Af Amer: >60 mL/min (ref >60)
GFR calc non Af Amer: >60 mL/min (ref >60)
Glucose, Bld: 63 mg/dL — ABNORMAL LOW (ref 70–99)
Potassium: 3.1 mmol/L — ABNORMAL LOW (ref 3.5–5.1)
Sodium: 141 mmol/L (ref 135–145)
Total Bilirubin: 0.4 mg/dL (ref 0.3–1.2)
Total Protein: 6.7 g/dL (ref 6.5–8.1)

## 2019-03-28 LAB — RAPID URINE DRUG SCREEN, HOSP PERFORMED
Amphetamines: NOT DETECTED
Barbiturates: NOT DETECTED
Benzodiazepines: NOT DETECTED
Cocaine: NOT DETECTED
Opiates: NOT DETECTED
Tetrahydrocannabinol: NOT DETECTED

## 2019-03-28 LAB — CBC
HCT: 33.7 % — ABNORMAL LOW (ref 36.0–46.0)
Hemoglobin: 10.8 g/dL — ABNORMAL LOW (ref 12.0–15.0)
MCH: 30.3 pg (ref 26.0–34.0)
MCHC: 32 g/dL (ref 30.0–36.0)
MCV: 94.7 fL (ref 80.0–100.0)
Platelets: 216 10*3/uL (ref 150–400)
RBC: 3.56 MIL/uL — ABNORMAL LOW (ref 3.87–5.11)
RDW: 13.6 % (ref 11.5–15.5)
WBC: 5.2 10*3/uL (ref 4.0–10.5)
nRBC: 0 % (ref 0.0–0.2)

## 2019-03-28 LAB — ETHANOL: Alcohol, Ethyl (B): <10 mg/dL (ref <10)

## 2019-03-28 LAB — SALICYLATE LEVEL: Salicylate Lvl: <7 mg/dL (ref 2.8–30.0)

## 2019-03-28 LAB — ACETAMINOPHEN LEVEL: Acetaminophen (Tylenol), Serum: <10 ug/mL — ABNORMAL LOW (ref 10–30)

## 2019-03-28 MED ORDER — RISPERIDONE 2 MG PO TABS: 6.0000 mg | ORAL_TABLET | Freq: Every day | ORAL | Status: DC

## 2019-03-28 MED ORDER — TRAZODONE HCL 100 MG PO TABS: 100.0000 mg | ORAL_TABLET | Freq: Every evening | ORAL | Status: DC | PRN

## 2019-03-28 MED ORDER — RISPERIDONE 2 MG PO TABS
3.0000 mg | ORAL_TABLET | Freq: Every day | ORAL | Status: DC
  Administered 2019-03-28: 3 mg via ORAL
  Filled 2019-03-28: qty 1

## 2019-03-28 MED ORDER — MOMETASONE FURO-FORMOTEROL FUM 200-5 MCG/ACT IN AERO
2.0000 | INHALATION_SPRAY | Freq: Two times a day (BID) | RESPIRATORY_TRACT | Status: DC
  Administered 2019-03-28: 2 via RESPIRATORY_TRACT
  Filled 2019-03-28: qty 8.8

## 2019-03-28 MED ORDER — ARIPIPRAZOLE 10 MG PO TABS
20.0000 mg | ORAL_TABLET | Freq: Every day | ORAL | Status: DC
  Administered 2019-03-28: 20 mg via ORAL
  Filled 2019-03-28: qty 2

## 2019-03-28 MED ORDER — POTASSIUM CHLORIDE CRYS ER 20 MEQ PO TBCR
40.0000 meq | EXTENDED_RELEASE_TABLET | Freq: Once | ORAL | Status: AC
  Administered 2019-03-28: 40 meq via ORAL
  Filled 2019-03-28: qty 2

## 2019-03-28 MED ORDER — FERROUS SULFATE 325 (65 FE) MG PO TABS
325.0000 mg | ORAL_TABLET | Freq: Every day | ORAL | Status: DC
  Administered 2019-03-28: 325 mg via ORAL
  Filled 2019-03-28: qty 1

## 2019-03-28 MED ORDER — DIVALPROEX SODIUM 500 MG PO DR TAB
750.0000 mg | DELAYED_RELEASE_TABLET | Freq: Two times a day (BID) | ORAL | Status: DC
  Administered 2019-03-28: 750 mg via ORAL
  Filled 2019-03-28: qty 1

## 2019-03-28 MED ORDER — BENZTROPINE MESYLATE 0.5 MG PO TABS
0.5000 mg | ORAL_TABLET | Freq: Two times a day (BID) | ORAL | Status: DC
  Administered 2019-03-28: 0.5 mg via ORAL
  Filled 2019-03-28: qty 1

## 2019-03-28 NOTE — B[facility] Counselor (Signed)
Clinician called TTS cart however no answer. Clinician to check back.    Vertell Novak, MS, [facility], [facility] Triage Specialist [phone]

## 2019-03-28 NOTE — [facility] Counselor (Signed)
Per Karna Christmas, Charge Nurse pt is currently sleeping. TTS to assess pt once alert, roused and able to engage.   Vertell Novak, [place], [facility], [facility] Triage Specialist [phone]

## 2019-03-28 NOTE — [facility] Counselor (Signed)
Clinician called TTS cart again however no answer. Clinician spoke to Nurse secretary to inform nurse.    Vertell Novak, [place], [facility], [facility] Triage Specialist [phone]

## 2019-03-28 NOTE — ED Notes (Signed)
TTS telepsych at bedside. When entering the room, patient resting quietly with no acute distress. Calm and cooperative.

## 2019-03-28 NOTE — [facility] Counselor (Signed)
Clinician called TTS cart within 5 minutes however no one answered. TTS to try back.    Vertell Novak, [place], [facility], [facility] Triage Specialist [phone]

## 2019-03-28 NOTE — ED Triage Notes (Signed)
Pt is voluntary with [employer], stating that she's suicidal with the intentions of cutting her wrists with a razor blade.  Pt states that she's just tired and miserable, she is suppose to take medications but doesn't take them regularly

## 2019-03-28 NOTE — ED Notes (Signed)
Patient has been calm and cooperative since waking up at 0700. This Probation officer asked patient how she felt this morning. Patient stated she felt fine and that she wasn't having any thoughts about harming herself. Patient denies SI, HI, and hallucinations.   Patient is cooperative. Patient has flat affect. Patient stated she was going to lie back down. Patient was helpful, she wanted to help put the stickers on her urine sample.

## 2019-03-28 NOTE — ED Provider Notes (Signed)
[place] DEPT Provider Note   CSN: [id number] Arrival date & time: 03/28/19  0036    History   Chief Complaint Chief Complaint  Patient presents with  . Suicidal    HPI [name] is a 48 y.o. female.     48 y/o female with hx of bipolar 1 d/o, DM, HTN, schizophrenia presents to the emergency department for depression and suicidal ideations.  She states that she has had thoughts about cutting her wrists with a razor blade.  Denies any self-injurious behavior over the past 1 to 2 days.  She has been taking her medication inconsistently, but did take her medication tonight.  Self-reports 3 hospitalizations for behavioral health reasons in the past 5 months.  Was last transferred to [facility] for psychiatric care in February.  Denies any homicidal ideations, alcohol use, illicit drug use.  The history is provided by the patient. No language interpreter was used.    Past Medical History:  Diagnosis Date  . Anemia   . Anxiety   . Asthma   . Bipolar 1 disorder ([place])   . Breast cancer ([place])   . Depression   . Diabetes mellitus without complication ([place])   . Hypertension   . Insomnia, persistent   . Schizophrenic disorder ([place])   . Seizures [facility])     Patient Active Problem List   Diagnosis Date Noted  . Schizophrenia ([place]) 10/04/2018  . Encounter for medical clearance for patient hold   . Noncompliance with treatment 07/16/2015  . Schizoaffective disorder, bipolar type ([place]) 01/11/2015    Past Surgical History:  Procedure Laterality Date  . BREAST SURGERY Left   . TONSILLECTOMY       OB History    Gravida  2   Para  2   Term  2   Preterm      AB      Living  2     SAB      TAB      Ectopic      Multiple      Live Births  2            Home Medications    Prior to Admission medications   Medication Sig Start Date End Date Taking? Authorizing Provider  albuterol (PROVENTIL HFA;VENTOLIN HFA) 108 (90 Base) MCG/ACT  inhaler Inhale 2 puffs into the lungs every 6 (six) hours as needed for wheezing or shortness of breath. 10/08/18  Yes Lindell Spar I, NP  ARIPiprazole (ABILIFY) 20 MG tablet Take 20 mg by mouth daily. 03/17/19  Yes [provider]  ARISTADA 662 MG/2.4ML PRSY Inject 2.4 mLs into the skin every 30 (thirty) days. 03/24/19  Yes [provider]  benztropine (COGENTIN) 0.5 MG tablet Take 1 tablet (0.5 mg total) by mouth 2 (two) times daily. 12/24/18  Yes [name], MD  budesonide-formoterol [facility]) 160-4.5 MCG/ACT inhaler Inhale 2 puffs into the lungs 2 (two) times daily. For Shortness of breath 10/08/18  Yes Lindell Spar I, NP  divalproex (DEPAKOTE) 250 MG DR tablet Take 3 tablets (750 mg total) by mouth 2 (two) times a day. 12/24/18  Yes [name], MD  ferrous sulfate 325 (65 FE) MG tablet Take 1 tablet (325 mg total) by mouth daily with breakfast. 12/24/18  Yes [name], MD  risperiDONE (RISPERDAL) 3 MG tablet 1 in am 2 at hs Patient taking differently: Take 3-6 mg by mouth See admin instructions. 1 tablets in am 2 tablets at night 12/24/18  Yes [name], MD  traZODone (DESYREL) 100 MG tablet Take 1 tablet (100 mg total) by mouth at bedtime as needed for sleep. 10/08/18  Yes Encarnacion Slates, NP    Family History Family History  Problem Relation Age of Onset  . Depression Mother   . Gout Mother   . Cancer Father        prostate  . Other Father        lung issue  . Alcoholism Other   . Heart attack Paternal Grandfather   . Heart attack Paternal Grandmother   . Heart attack Maternal Grandmother   . Heart attack Maternal Grandfather   . Depression Son   . Anxiety disorder Son     Social History Social History   Tobacco Use  . Smoking status: Former Smoker    Types: Cigarettes  . Smokeless tobacco: Never Used  Substance Use Topics  . Alcohol use: Yes    Comment: BAC not available at time of assessment  . Drug use: No     Allergies   Haldol [haloperidol  decanoate], Haloperidol, Penicillins, Pollen extract, and Shrimp [shellfish allergy]   Review of Systems Review of Systems Ten systems reviewed and are negative for acute change, except as noted in the HPI.    Physical Exam Updated Vital Signs BP 121/81 (BP Location: Left Arm)   Pulse 68   Temp 98.6 F (37 C) (Oral)   Resp 15   Ht 5\' 6"  (1.676 m)   Wt 81.6 kg   LMP 03/13/2019   SpO2 100%   BMI 29.05 kg/m   Physical Exam Vitals signs and nursing note reviewed.  Constitutional:      General: She is not in acute distress.    Appearance: She is well-developed. She is not diaphoretic.     Comments: Nontoxic appearing and in NAD  HENT:     Head: Normocephalic and atraumatic.  Eyes:     General: No scleral icterus.    Conjunctiva/sclera: Conjunctivae normal.  Neck:     Musculoskeletal: Normal range of motion.  Pulmonary:     Effort: Pulmonary effort is normal. No respiratory distress.  Musculoskeletal: Normal range of motion.  Skin:    General: Skin is warm and dry.     Coloration: Skin is not pale.     Findings: No erythema or rash.  Neurological:     Mental Status: She is alert and oriented to person, place, and time.  Psychiatric:        Mood and Affect: Mood is depressed.        Speech: Speech is tangential.        Behavior: Behavior normal. Behavior is cooperative.        Thought Content: Thought content includes suicidal ideation. Thought content does not include homicidal ideation. Thought content does not include homicidal or suicidal plan.      ED Treatments / Results  Labs (all labs ordered are listed, but only abnormal results are displayed) Labs Reviewed  COMPREHENSIVE METABOLIC PANEL - Abnormal; Notable for the following components:      Result Value   Potassium 3.1 (*)    Glucose, Bld 63 (*)    Calcium 8.8 (*)    AST 10 (*)    All other components within normal limits  ACETAMINOPHEN LEVEL - Abnormal; Notable for the following components:    Acetaminophen (Tylenol), Serum <10 (*)    All other components within normal limits  CBC - Abnormal; Notable for the  following components:   RBC 3.56 (*)    Hemoglobin 10.8 (*)    HCT 33.7 (*)    All other components within normal limits  ETHANOL  SALICYLATE LEVEL  RAPID URINE DRUG SCREEN, HOSP PERFORMED  I-STAT BETA HCG BLOOD, ED ([facility], [facility], [facility] ONLY)    EKG None  Radiology No results found.  Procedures Procedures (including critical care time)  Medications Ordered in ED Medications - No data to display   Initial Impression / Assessment and Plan / ED Course  I have reviewed the triage vital signs and the nursing notes.  Pertinent labs & imaging results that were available during my care of the patient were reviewed by me and considered in my medical decision making (see chart for details).        48 year old female presents to the emergency department for psychiatric evaluation.  She has been medically cleared and is awaiting TTS for assessment.  Disposition to be determined by oncoming ED provider.   Final Clinical Impressions(s) / ED Diagnoses   Final diagnoses:  Depression, unspecified depression type  Noncompliance with medication regimen    ED Discharge Orders    None       Antonietta Breach, PA-C 03/28/19 [place], Delice Bison, DO 03/28/19 [id number]

## 2019-03-28 NOTE — ED Notes (Signed)
Patient was given breakfast tray. 

## 2019-03-28 NOTE — ED Notes (Signed)
Patient is asleep.  
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
## 2019-03-28 NOTE — ED Notes (Signed)
Patient ambulated to the bathroom w/ a steady gate.

## 2019-03-28 NOTE — BH Assessment (Signed)
[facility] Assessment Progress Note  Per Jinny Blossom, PMHNP, this pt does not require psychiatric hospitalization at this time.  Pt is to be discharged from [facility] to follow up with the [facility] Team.  This has been included in pt's discharge instructions.  Pt's nurse has been notified.  At 09:43 this Probation officer called the [employer] and spoke to [place] to notify her.  Jalene Mullet, [place] Triage Specialist [phone]

## 2019-03-28 NOTE — Discharge Instructions (Signed)
For your mental health needs, you are advised to continue treatment with the [facility]: ° °     [facility] °     [address] °     [place], [place] [address] °     [phone] °     Crisis number: [phone] ° ° ° °

## 2019-03-28 NOTE — ED Notes (Signed)
TTS unable to be completed-she is sleeping and will not wake up enough to engage with Lytle Michaels TTS

## 2019-04-01 ENCOUNTER — Emergency Department (HOSPITAL_COMMUNITY)
Admission: EM | Admit: 2019-04-01 | Discharge: 2019-04-01 | Disposition: A | Discharge status: Home / Self Care | Payer: 59 | Attending: Emergency Medicine | Admitting: Emergency Medicine

## 2019-04-01 ENCOUNTER — Encounter (HOSPITAL_COMMUNITY): Payer: Self-pay

## 2019-04-01 ENCOUNTER — Other Ambulatory Visit: Payer: Self-pay

## 2019-04-01 DIAGNOSIS — Z87891 Personal history of nicotine dependence: Secondary | ICD-10-CM | POA: Diagnosis not present

## 2019-04-01 DIAGNOSIS — I10 Essential (primary) hypertension: Secondary | ICD-10-CM | POA: Diagnosis not present

## 2019-04-01 DIAGNOSIS — E119 Type 2 diabetes mellitus without complications: Secondary | ICD-10-CM | POA: Diagnosis not present

## 2019-04-01 DIAGNOSIS — M6281 Muscle weakness (generalized): Secondary | ICD-10-CM | POA: Diagnosis not present

## 2019-04-01 DIAGNOSIS — R197 Diarrhea, unspecified: Secondary | ICD-10-CM | POA: Insufficient documentation

## 2019-04-01 DIAGNOSIS — R531 Weakness: Secondary | ICD-10-CM | POA: Diagnosis present

## 2019-04-01 LAB — COMPREHENSIVE METABOLIC PANEL
ALT: 9 U/L (ref 0–44)
AST: 11 U/L — ABNORMAL LOW (ref 15–41)
Albumin: 3.6 g/dL (ref 3.5–5.0)
Alkaline Phosphatase: 43 U/L (ref 38–126)
Anion gap: 7 (ref 5–15)
BUN: 11 mg/dL (ref 6–20)
CO2: 25 mmol/L (ref 22–32)
Calcium: 8.8 mg/dL — ABNORMAL LOW (ref 8.9–10.3)
Chloride: 105 mmol/L (ref 98–111)
Creatinine, Ser: 0.67 mg/dL (ref 0.44–1.00)
GFR calc Af Amer: >60 mL/min (ref >60)
GFR calc non Af Amer: >60 mL/min (ref >60)
Glucose, Bld: 99 mg/dL (ref 70–99)
Potassium: 4 mmol/L (ref 3.5–5.1)
Sodium: 137 mmol/L (ref 135–145)
Total Bilirubin: 0.3 mg/dL (ref 0.3–1.2)
Total Protein: 6.5 g/dL (ref 6.5–8.1)

## 2019-04-01 LAB — URINALYSIS, ROUTINE W REFLEX MICROSCOPIC
Bilirubin Urine: NEGATIVE
Glucose, UA: NEGATIVE mg/dL
Hgb urine dipstick: NEGATIVE
Ketones, ur: NEGATIVE mg/dL
Leukocytes,Ua: NEGATIVE
Nitrite: NEGATIVE
Protein, ur: NEGATIVE mg/dL
Specific Gravity, Urine: 1.011 (ref 1.005–1.030)
pH: 6 (ref 5.0–8.0)

## 2019-04-01 LAB — CBC WITH DIFFERENTIAL/PLATELET
Abs Immature Granulocytes: 0.01 10*3/uL (ref 0.00–0.07)
Basophils Absolute: 0 10*3/uL (ref 0.0–0.1)
Basophils Relative: 0 %
Eosinophils Absolute: 0.3 10*3/uL (ref 0.0–0.5)
Eosinophils Relative: 6 %
HCT: 33.1 % — ABNORMAL LOW (ref 36.0–46.0)
Hemoglobin: 10.6 g/dL — ABNORMAL LOW (ref 12.0–15.0)
Immature Granulocytes: 0 %
Lymphocytes Relative: 38 %
Lymphs Abs: 1.8 10*3/uL (ref 0.7–4.0)
MCH: 30.5 pg (ref 26.0–34.0)
MCHC: 32 g/dL (ref 30.0–36.0)
MCV: 95.4 fL (ref 80.0–100.0)
Monocytes Absolute: 0.3 10*3/uL (ref 0.1–1.0)
Monocytes Relative: 6 %
Neutro Abs: 2.3 10*3/uL (ref 1.7–7.7)
Neutrophils Relative %: 50 %
Platelets: 193 10*3/uL (ref 150–400)
RBC: 3.47 MIL/uL — ABNORMAL LOW (ref 3.87–5.11)
RDW: 13.9 % (ref 11.5–15.5)
WBC: 4.6 10*3/uL (ref 4.0–10.5)
nRBC: 0 % (ref 0.0–0.2)

## 2019-04-01 LAB — LIPASE, BLOOD: Lipase: 29 U/L (ref 11–51)

## 2019-04-01 MED ORDER — SODIUM CHLORIDE 0.9 % IV BOLUS
1000.0000 mL | Freq: Once | INTRAVENOUS | Status: AC
  Administered 2019-04-01: 1000 mL via INTRAVENOUS

## 2019-04-01 NOTE — Patient Outreach (Signed)
[facility]) Care Management  04/01/2019  [name] 1971/02/23 [id number]   Referral Date: 04/01/2019 Referral Source: Um referral Referral Reason:  4 Admission and 2 ED visits this year due to behavioral needs, Depression, Manic behavior, suicidal, medication non compliance, schizoaffective disorder. Numbers [phone] and [phone]   Outreach Attempt: called available numbers.  Unable to leave a voice message on any numbers.  Plan: RN CM will attempt again within 4 business days and send letter.     Jone Baseman, RN, MSN [facility] Care Management Care Management Coordinator Direct Line [phone] Toll Free: [phone]  Fax: [phone]

## 2019-04-01 NOTE — ED Notes (Signed)
Patient aware we need urine sample. Patient states she does not have to go at this time.   Patient given soda and crackers per order.

## 2019-04-01 NOTE — Discharge Instructions (Signed)
You are seen in the ER for generalized weakness, diarrhea.  Blood work today was normal.  Urinalysis was normal.  Your EKG was normal.  We gave you some fluids and you felt better.  I suspect you may be a little dehydrated from diarrhea and this is what caused her symptoms.  Stay hydrated and drink 2 L of water daily.  Gentle diet.  Use Imodium as needed for diarrhea.

## 2019-04-01 NOTE — ED Provider Notes (Signed)
[place] DEPT Provider Note   CSN: [id number] Arrival date & time: 04/01/19  0536    History   Chief Complaint Chief Complaint  Patient presents with  . Weakness    HPI [name] is a 48 y.o. female with history of schizoaffective disorder presents to the ER for "weakness".  Sudden onset this morning.  States generally she just feels really tired, low energy.  Symptoms began while she was walking to the bus stop around 5 AM today on her way to work.  She has had nonbloody non-melanotic diarrhea for the last 24 hours for "23 minutes".  She otherwise denies any fever, nausea, vomiting, chest pain, shortness of breath, syncope, cough, abdominal pain, dysuria.  States for the last several weeks she is intermittently seeing bugs around her house and states that she hopes they are not coming out of her.  States she thinks "this is just my schizophrenia".  Has been compliant with all her psych meds.  Her psych counselor went to visit her yesterday and the meeting went well.     HPI  Past Medical History:  Diagnosis Date  . Anemia   . Anxiety   . Asthma   . Bipolar 1 disorder ([place])   . Breast cancer ([place])   . Depression   . Diabetes mellitus without complication ([place])   . Hypertension   . Insomnia, persistent   . Schizophrenic disorder ([name])   . Seizures [facility])     Patient Active Problem List   Diagnosis Date Noted  . Schizophrenia ([place]) 10/04/2018  . Encounter for medical clearance for patient hold   . Noncompliance with treatment 07/16/2015  . Schizoaffective disorder, bipolar type ([place]) 01/11/2015    Past Surgical History:  Procedure Laterality Date  . BREAST SURGERY Left   . TONSILLECTOMY       OB History    Gravida  2   Para  2   Term  2   Preterm      AB      Living  2     SAB      TAB      Ectopic      Multiple      Live Births  2            Home Medications    Prior to Admission medications    Medication Sig Start Date End Date Taking? Authorizing Provider  albuterol (PROVENTIL HFA;VENTOLIN HFA) 108 (90 Base) MCG/ACT inhaler Inhale 2 puffs into the lungs every 6 (six) hours as needed for wheezing or shortness of breath. 10/08/18   Lindell Spar I, NP  ARIPiprazole (ABILIFY) 20 MG tablet Take 20 mg by mouth daily. 03/17/19   [provider]  ARISTADA 662 MG/2.4ML PRSY Inject 2.4 mLs into the skin every 30 (thirty) days. 03/24/19   [provider]  benztropine (COGENTIN) 0.5 MG tablet Take 1 tablet (0.5 mg total) by mouth 2 (two) times daily. 12/24/18   [name], MD  budesonide-formoterol [facility]) 160-4.5 MCG/ACT inhaler Inhale 2 puffs into the lungs 2 (two) times daily. For Shortness of breath 10/08/18   Lindell Spar I, NP  divalproex (DEPAKOTE) 250 MG DR tablet Take 3 tablets (750 mg total) by mouth 2 (two) times a day. 12/24/18   [name], MD  ferrous sulfate 325 (65 FE) MG tablet Take 1 tablet (325 mg total) by mouth daily with breakfast. 12/24/18   [name], MD  risperiDONE (RISPERDAL) 3 MG tablet  1 in am 2 at hs Patient taking differently: Take 3-6 mg by mouth See admin instructions. 1 tablets in am 2 tablets at night 12/24/18   [name], MD  traZODone (DESYREL) 100 MG tablet Take 1 tablet (100 mg total) by mouth at bedtime as needed for sleep. 10/08/18   Encarnacion Slates, NP    Family History Family History  Problem Relation Age of Onset  . Depression Mother   . Gout Mother   . Cancer Father        prostate  . Other Father        lung issue  . Alcoholism Other   . Heart attack Paternal Grandfather   . Heart attack Paternal Grandmother   . Heart attack Maternal Grandmother   . Heart attack Maternal Grandfather   . Depression Son   . Anxiety disorder Son     Social History Social History   Tobacco Use  . Smoking status: Former Smoker    Types: Cigarettes  . Smokeless tobacco: Never Used  Substance Use Topics  . Alcohol use: Yes     Comment: BAC not available at time of assessment  . Drug use: No     Allergies   Haldol [haloperidol decanoate], Haloperidol, Penicillins, Pollen extract, and Shrimp [shellfish allergy]   Review of Systems Review of Systems  Neurological: Positive for weakness (Generalized).  All other systems reviewed and are negative.    Physical Exam Updated Vital Signs BP 115/82   Pulse (!) 58   Temp 98 F (36.7 C) (Oral)   Resp 16   LMP 03/13/2019   SpO2 100%   Physical Exam Vitals signs and nursing note reviewed.  Constitutional:      Appearance: She is well-developed.     Comments: Non toxic in NAD  HENT:     Head: Normocephalic and atraumatic.     Nose: Nose normal.     Mouth/Throat:     Mouth: Mucous membranes are moist.  Eyes:     Conjunctiva/sclera: Conjunctivae normal.  Neck:     Musculoskeletal: Normal range of motion.  Cardiovascular:     Rate and Rhythm: Normal rate and regular rhythm.  Pulmonary:     Effort: Pulmonary effort is normal.     Breath sounds: Normal breath sounds.  Abdominal:     General: Bowel sounds are normal.     Palpations: Abdomen is soft.     Tenderness: There is no abdominal tenderness.     Comments: No G/R/R. No suprapubic or CVA tenderness. Negative Murphy's and McBurney's. Active BS to lower quadrants.   Musculoskeletal: Normal range of motion.  Skin:    General: Skin is warm and dry.     Capillary Refill: Capillary refill takes less than 2 seconds.  Neurological:     Mental Status: She is alert.  Psychiatric:        Behavior: Behavior normal.      ED Treatments / Results  Labs (all labs ordered are listed, but only abnormal results are displayed) Labs Reviewed  CBC WITH DIFFERENTIAL/PLATELET - Abnormal; Notable for the following components:      Result Value   RBC 3.47 (*)    Hemoglobin 10.6 (*)    HCT 33.1 (*)    All other components within normal limits  COMPREHENSIVE METABOLIC PANEL - Abnormal; Notable for the following  components:   Calcium 8.8 (*)    AST 11 (*)    All other components within normal limits  URINALYSIS,  ROUTINE W REFLEX MICROSCOPIC - Abnormal; Notable for the following components:   Color, Urine STRAW (*)    All other components within normal limits  LIPASE, BLOOD    EKG EKG Interpretation  Date/Time:  Tuesday April 01 2019 07:46:29 EDT Ventricular Rate:  55 PR Interval:    QRS Duration: 86 QT Interval:  462 QTC Calculation: 442 R Axis:   42 Text Interpretation:  Sinus rhythm Low voltage, extremity and precordial leads Baseline wander in lead(s) I III aVL aVF V1 No significant change since last tracing Confirmed by Blanchie Dessert [id number]) on 04/01/2019 8:13:28 AM   Radiology No results found.  Procedures Procedures (including critical care time)  Medications Ordered in ED Medications  sodium chloride 0.9 % bolus 1,000 mL (1,000 mLs Intravenous New Bag/Given 04/01/19 0815)     Initial Impression / Assessment and Plan / ED Course  I have reviewed the triage vital signs and the nursing notes.  Pertinent labs & imaging results that were available during my care of the patient were reviewed by me and considered in my medical decision making (see chart for details).  I have reviewed pt's EMR to obtain PMH  Highest on DDX is mild dehydration from recent diarrhea.  She has negative review of systems otherwise been no red flags associated with her diarrhea, generalized weakness.  No fever, vomiting, melena, blood in stool, chest pain, shortness of breath, syncope, palpitations or abdominal pain.  Exam is benign.  Vital signs WNL.  ER work-up reviewed by me is unremarkable.  No significant electrical abnormalities.  No anemia.  EKGs without ischemia, dysrhythmias.  UA without infection.  Patient was given IV fluids and felt better.  Repeat abdominal exam is unchanged, no tenderness.  Given clinical improvement, benign work-up I do not think further emergent imaging/lab work is  indicated.  I have low suspicion for acute intra-abdominal process, bacterial diarrhea, cardiac etiology to her generalized weakness.  DC with oral hydration, rest, Imodium.  Return precautions discussed.  Patient comfortable with this.  Final Clinical Impressions(s) / ED Diagnoses   Final diagnoses:  Generalized weakness  Diarrhea of presumed infectious origin    ED Discharge Orders    None       Arlean Hopping 04/01/19 1015    Blanchie Dessert, MD 04/02/19 2339

## 2019-04-01 NOTE — ED Triage Notes (Signed)
Pt complains of being weak, she was seen here last week for the same Pt's words are kind of mumbled and she doesn't directly answer questions

## 2019-04-02 ENCOUNTER — Other Ambulatory Visit: Payer: Self-pay

## 2019-04-02 NOTE — Patient Outreach (Signed)
[place] [facility]) Care Management  04/02/2019  Sabriyah Wilcher 1971-01-24 [id number]   Referral Date: 04/01/2019 Referral Source: Um referral Referral Reason:  4 Admission and 2 ED visits this year due to behavioral needs, Depression, Manic behavior, suicidal, medication non compliance, schizoaffective disorder. Numbers [phone] and [phone]   Outreach Attempt: called available numbers.  Unable to leave a voice message on any numbers.  Plan: RN CM will attempt again within 4 business days.  Jone Baseman, RN, MSN [place] [facility] Care Management Coordinator Direct Line [phone] Cell [phone] Toll Free: [phone]  Fax: [phone]

## 2019-04-03 ENCOUNTER — Other Ambulatory Visit: Payer: Self-pay

## 2019-04-03 NOTE — Patient Outreach (Signed)
[place] [facility]) Care Management  04/03/2019  [name] 1971/07/20 [id number]   Referral Date:04/01/2019 Referral Source:Um referral Referral Reason:4 Admission and 2 ED visits this year due to behavioral needs, Depression, Manic behavior, suicidal, medication non compliance, schizoaffective disorder. Numbers [phone] and [phone]   Outreach Attempt:called available numbers. Unable to leave a voice message on any numbers.  Plan:RN CM will wait return call. If no return call will close case.   Jone Baseman, RN, MSN [place] [facility] Care Management Coordinator Direct Line [phone] Cell [phone] Toll Free: [phone]  Fax: [phone]

## 2019-04-04 ENCOUNTER — Ambulatory Visit (HOSPITAL_COMMUNITY)
Admission: RE | Admit: 2019-04-04 | Discharge: 2019-04-04 | Disposition: A | Discharge status: Home / Self Care | Payer: Medicare Other | Source: Ambulatory Visit | Attending: Psychiatry | Admitting: Psychiatry

## 2019-04-04 NOTE — H&P (Addendum)
Behavioral Health Medical Screening Exam  [name] is an 48 y.o. female. Pt is well known to this hospital system. She was last assessed on 03/28/2019 for having suicidal thoughts with a plan to cut her wrists. She called 911 today and the ambulance brought her to [facility] and dropped her off. She stated that she was having suicidal thoughts earlier today but is not currently. She also stated she lives alone and was upset with some of the neighbors and the drug using and selling in her neighbor hood. She was calm and cooperative and oriented x 4. She was rambling about different subjects but this is her baseline. She has an [name] team with [facility] and stated she has been taking her medications as prescribed. She is grounded in reality and stated she feels safe to return home and knows to return if she has any thoughts of self harm.   Total Time spent with patient: 30 minutes  Psychiatric Specialty Exam: Physical Exam  Constitutional: She is oriented to person, place, and time. She appears well-developed and well-nourished.  HENT:  Head: Normocephalic.  Respiratory: Effort normal.  Musculoskeletal: Normal range of motion.  Neurological: She is alert and oriented to person, place, and time.  Psychiatric: Her behavior is normal. Thought content normal. Her mood appears anxious. Her speech is rapid and/or pressured. Cognition and memory are normal.    Review of Systems  Psychiatric/Behavioral: Positive for depression. The patient is nervous/anxious.   All other systems reviewed and are negative.   Last menstrual period 03/13/2019.There is no height or weight on file to calculate BMI.  General Appearance: Casual  Eye Contact:  Good  Speech:  Clear and Coherent  Volume:  Normal  Mood:  Irritable  Affect:  Congruent  Thought Process:  Coherent and Descriptions of Associations: Intact  Orientation:  Full (Time, Place, and Person)  Thought Content:  Logical  Suicidal Thoughts:  No  Homicidal  Thoughts:  No  Memory:  Immediate;   Good Recent;   Fair Remote;   Fair  Judgement:  Fair  Insight:  Fair  Psychomotor Activity:  Normal  Concentration: Concentration: Fair and Attention Span: Fair  Recall:  [employer] of Knowledge:Good  Language: Good  Akathisia:  Negative  Handed:  Right  AIMS (if indicated):     Assets:  Agricultural consultant Housing Social Support  Sleep:       Musculoskeletal: Strength & Muscle Tone: within normal limits Gait & Station: normal Patient leans: N/A  Last menstrual period 03/13/2019.  Recommendations:  Based on my evaluation the patient does not appear to have an emergency medical condition.  Ethelene Hal, NP 04/04/2019, 3:57 PM  Patient seen face-to-face for psychiatric evaluation, chart reviewed and case discussed with the physician extender and developed treatment plan. Reviewed the information documented and agree with the treatment plan. Corena Pilgrim, MD

## 2019-04-15 ENCOUNTER — Other Ambulatory Visit: Payer: Self-pay

## 2019-04-15 NOTE — Patient Outreach (Signed)
[place] [facility]) Care Management  04/15/2019  [name] 12/20/1970 [id number]   Multiple attempts to establish contact with patient without success. No response from letter mailed to patient.   Plan: RN CM will close case at this time.   Jone Baseman, RN, MSN [place] [facility] Care Management Coordinator Direct Line [phone] Cell [phone] Toll Free: [phone]  Fax: [phone]

## 2019-04-19 ENCOUNTER — Encounter (HOSPITAL_COMMUNITY): Payer: Self-pay

## 2019-04-19 ENCOUNTER — Other Ambulatory Visit: Payer: Self-pay

## 2019-04-19 DIAGNOSIS — I10 Essential (primary) hypertension: Secondary | ICD-10-CM | POA: Diagnosis not present

## 2019-04-19 DIAGNOSIS — Z87891 Personal history of nicotine dependence: Secondary | ICD-10-CM | POA: Insufficient documentation

## 2019-04-19 DIAGNOSIS — Z79899 Other long term (current) drug therapy: Secondary | ICD-10-CM | POA: Diagnosis not present

## 2019-04-19 DIAGNOSIS — Z888 Allergy status to other drugs, medicaments and biological substances status: Secondary | ICD-10-CM | POA: Insufficient documentation

## 2019-04-19 DIAGNOSIS — J45909 Unspecified asthma, uncomplicated: Secondary | ICD-10-CM | POA: Diagnosis not present

## 2019-04-19 DIAGNOSIS — E119 Type 2 diabetes mellitus without complications: Secondary | ICD-10-CM | POA: Insufficient documentation

## 2019-04-19 DIAGNOSIS — Z91013 Allergy to seafood: Secondary | ICD-10-CM | POA: Insufficient documentation

## 2019-04-19 DIAGNOSIS — Z7984 Long term (current) use of oral hypoglycemic drugs: Secondary | ICD-10-CM | POA: Insufficient documentation

## 2019-04-19 DIAGNOSIS — Z880 Allergy status to penicillin: Secondary | ICD-10-CM | POA: Diagnosis not present

## 2019-04-19 DIAGNOSIS — R112 Nausea with vomiting, unspecified: Secondary | ICD-10-CM | POA: Insufficient documentation

## 2019-04-19 NOTE — ED Triage Notes (Addendum)
Pt reports N/V. Reports one episode of vomiting at the grocery store. Symptom free for 3 hours. Ambulatory. Hx of schizophrenia. Denies hallucinations or SI/HI. States that she is worried that the nausea will come back.

## 2019-04-20 ENCOUNTER — Emergency Department (HOSPITAL_COMMUNITY)
Admission: EM | Admit: 2019-04-20 | Discharge: 2019-04-20 | Disposition: A | Discharge status: Home / Self Care | Payer: 59 | Attending: Emergency Medicine | Admitting: Emergency Medicine

## 2019-04-20 DIAGNOSIS — R112 Nausea with vomiting, unspecified: Secondary | ICD-10-CM

## 2019-04-20 DIAGNOSIS — R197 Diarrhea, unspecified: Secondary | ICD-10-CM

## 2019-04-20 MED ORDER — ONDANSETRON HCL 4 MG PO TABS: 4.0000 mg | ORAL_TABLET | Freq: Four times a day (QID) | ORAL | 0 refills | Status: DC | PRN

## 2019-04-20 MED ORDER — ONDANSETRON HCL 4 MG PO TABS
4.0000 mg | ORAL_TABLET | Freq: Once | ORAL | Status: AC
  Administered 2019-04-20: 4 mg via ORAL
  Filled 2019-04-20: qty 1

## 2019-04-20 NOTE — ED Provider Notes (Signed)
[place] DEPT Provider Note   CSN: [id number] Arrival date & time: 04/19/19  2243    History   Chief Complaint Chief Complaint  Patient presents with  . Nausea    HPI [name] is a 48 y.o. female.   The history is provided by the patient.  She has history of diabetes, hypertension, bipolar disorder, seizures and comes in because of an episode of vomiting and an episode of diarrhea.  She states that she had diarrhea through the day and vomited once when she was in the supermarket.  She goes through a lot of extraneous details with a flight of ideas, but she denies abdominal pain or fever.  She states that she no longer is nauseated and has tolerated oral fluids without difficulty.  She denies any sick contacts.  She specifically denies exposure to anyone with COVID-19.  Past Medical History:  Diagnosis Date  . Anemia   . Anxiety   . Asthma   . Bipolar 1 disorder ([place])   . Breast cancer ([place])   . Depression   . Diabetes mellitus without complication ([place])   . Hypertension   . Insomnia, persistent   . Schizophrenic disorder ([place])   . Seizures [facility])     Patient Active Problem List   Diagnosis Date Noted  . Schizophrenia ([place]) 10/04/2018  . Encounter for medical clearance for patient hold   . Noncompliance with treatment 07/16/2015  . Schizoaffective disorder, bipolar type ([place]) 01/11/2015    Past Surgical History:  Procedure Laterality Date  . BREAST SURGERY Left   . TONSILLECTOMY       OB History    Gravida  2   Para  2   Term  2   Preterm      AB      Living  2     SAB      TAB      Ectopic      Multiple      Live Births  2            Home Medications    Prior to Admission medications   Medication Sig Start Date End Date Taking? Authorizing Provider  ARIPiprazole (ABILIFY) 20 MG tablet Take 20 mg by mouth daily. 03/17/19  Yes [provider]  benztropine (COGENTIN) 0.5 MG tablet Take 1  tablet (0.5 mg total) by mouth 2 (two) times daily. 12/24/18  Yes [name], MD  divalproex (DEPAKOTE) 250 MG DR tablet Take 3 tablets (750 mg total) by mouth 2 (two) times a day. Patient taking differently: Take 500 mg by mouth 2 (two) times a day.  12/24/18  Yes [name], MD  risperiDONE (RISPERDAL) 3 MG tablet 1 in am 2 at hs Patient taking differently: Take 3-6 mg by mouth See admin instructions. 1 tablets in am 2 tablets at night 12/24/18  Yes [name], MD  traZODone (DESYREL) 100 MG tablet Take 1 tablet (100 mg total) by mouth at bedtime as needed for sleep. 10/08/18  Yes Lindell Spar I, NP  albuterol (PROVENTIL HFA;VENTOLIN HFA) 108 (90 Base) MCG/ACT inhaler Inhale 2 puffs into the lungs every 6 (six) hours as needed for wheezing or shortness of breath. 10/08/18   Lindell Spar I, NP  ARISTADA 662 MG/2.4ML PRSY Inject 2.4 mLs into the skin every 30 (thirty) days. 03/24/19   [provider]  budesonide-formoterol (SYMBICORT) 160-4.5 MCG/ACT inhaler Inhale 2 puffs into the lungs 2 (two) times daily. For Shortness of  breath 10/08/18   Lindell Spar I, NP  ondansetron (ZOFRAN) 4 MG tablet Take 1 tablet (4 mg total) by mouth every 6 (six) hours as needed for nausea. [id number]   Delora Fuel, MD  ferrous sulfate 325 (65 FE) MG tablet Take 1 tablet (325 mg total) by mouth daily with breakfast. Patient not taking: Reported on 04/20/2019 12/24/18 04/20/19  [name], MD    Family History Family History  Problem Relation Age of Onset  . Depression Mother   . Gout Mother   . Cancer Father        prostate  . Other Father        lung issue  . Alcoholism Other   . Heart attack Paternal Grandfather   . Heart attack Paternal Grandmother   . Heart attack Maternal Grandmother   . Heart attack Maternal Grandfather   . Depression Son   . Anxiety disorder Son     Social History Social History   Tobacco Use  . Smoking status: Former Smoker    Types: Cigarettes  . Smokeless tobacco:  Never Used  Substance Use Topics  . Alcohol use: Yes    Comment: BAC not available at time of assessment  . Drug use: No     Allergies   Haldol [haloperidol decanoate], Penicillins, Pollen extract, and Shrimp [shellfish allergy]   Review of Systems Review of Systems  All other systems reviewed and are negative.    Physical Exam Updated Vital Signs BP 109/76   Pulse 60   Temp 98.4 F (36.9 C) (Oral)   Resp 16   Wt 81.6 kg   SpO2 100%   BMI 29.05 kg/m   Physical Exam Vitals signs and nursing note reviewed.    48 year old female, resting comfortably and in no acute distress. Vital signs are normal. Oxygen saturation is 100%, which is normal. Head is normocephalic and atraumatic. PERRLA, EOMI. Oropharynx is clear. Neck is nontender and supple without adenopathy or JVD. Back is nontender and there is no CVA tenderness. Lungs are clear without rales, wheezes, or rhonchi. Chest is nontender. Heart has regular rate and rhythm without murmur. Abdomen is soft, flat, nontender without masses or hepatosplenomegaly and peristalsis is normoactive. Extremities have no cyanosis or edema, full range of motion is present. Skin is warm and dry without rash. Neurologic: Awake and alert and oriented but exhibiting flight of ideas, cranial nerves are intact, there are no motor or sensory deficits.  ED Treatments / Results   Procedures Procedures   Medications Ordered in ED Medications  ondansetron (ZOFRAN) tablet 4 mg (has no administration in time range)     Initial Impression / Assessment and Plan / ED Course  I have reviewed the triage vital signs and the nursing notes.  Nausea, vomiting, diarrhea which probably represents viral gastroenteritis.  No red flags to suggest more serious pathology.  She only vomited once and is not clinically dehydrated.  No indication for laboratory testing or IV fluids.  She is given a dose of ondansetron and is discharged with prescription for  same.  Told to use over-the-counter loperamide as needed for diarrhea.  Return precautions discussed.  Old records are reviewed, showing several visits and admissions for psychiatric conditions.  Final Clinical Impressions(s) / ED Diagnoses   Final diagnoses:  Nausea vomiting and diarrhea    ED Discharge Orders         Ordered    ondansetron (ZOFRAN) 4 MG tablet  Every 6 hours PRN,  Status:  Discontinued     04/20/19 0215    ondansetron (ZOFRAN) 4 MG tablet  Every 6 hours PRN     04/20/19 [id number]           Delora Fuel, MD [id number] [phone]

## 2019-04-20 NOTE — ED Notes (Signed)
Pt denies nausea or pain. Stated she had one episode of vomiting. Warm blanket provided to pt.

## 2019-04-20 NOTE — Discharge Instructions (Addendum)
Take loperamide as needed for diarrhea.

## 2019-05-02 ENCOUNTER — Encounter (HOSPITAL_COMMUNITY): Payer: Self-pay | Admitting: Emergency Medicine

## 2019-05-02 ENCOUNTER — Other Ambulatory Visit: Payer: Self-pay

## 2019-05-02 ENCOUNTER — Emergency Department (HOSPITAL_COMMUNITY)
Admission: EM | Admit: 2019-05-02 | Discharge: 2019-05-03 | Disposition: A | Discharge status: Other Acute Inpatient Hospital | Payer: 59 | Attending: Emergency Medicine | Admitting: Emergency Medicine

## 2019-05-02 DIAGNOSIS — Z20828 Contact with and (suspected) exposure to other viral communicable diseases: Secondary | ICD-10-CM | POA: Diagnosis not present

## 2019-05-02 DIAGNOSIS — Z008 Encounter for other general examination: Secondary | ICD-10-CM | POA: Insufficient documentation

## 2019-05-02 DIAGNOSIS — Z9114 Patient's other noncompliance with medication regimen: Secondary | ICD-10-CM | POA: Diagnosis not present

## 2019-05-02 DIAGNOSIS — J45909 Unspecified asthma, uncomplicated: Secondary | ICD-10-CM | POA: Diagnosis not present

## 2019-05-02 DIAGNOSIS — Z79899 Other long term (current) drug therapy: Secondary | ICD-10-CM | POA: Insufficient documentation

## 2019-05-02 DIAGNOSIS — I10 Essential (primary) hypertension: Secondary | ICD-10-CM | POA: Insufficient documentation

## 2019-05-02 DIAGNOSIS — R45851 Suicidal ideations: Secondary | ICD-10-CM | POA: Diagnosis not present

## 2019-05-02 DIAGNOSIS — R4689 Other symptoms and signs involving appearance and behavior: Secondary | ICD-10-CM | POA: Diagnosis present

## 2019-05-02 DIAGNOSIS — Z853 Personal history of malignant neoplasm of breast: Secondary | ICD-10-CM | POA: Diagnosis not present

## 2019-05-02 DIAGNOSIS — Z87891 Personal history of nicotine dependence: Secondary | ICD-10-CM | POA: Diagnosis not present

## 2019-05-02 DIAGNOSIS — F319 Bipolar disorder, unspecified: Secondary | ICD-10-CM | POA: Diagnosis not present

## 2019-05-02 DIAGNOSIS — E119 Type 2 diabetes mellitus without complications: Secondary | ICD-10-CM | POA: Insufficient documentation

## 2019-05-02 LAB — RAPID URINE DRUG SCREEN, HOSP PERFORMED
Amphetamines: NOT DETECTED
Barbiturates: NOT DETECTED
Benzodiazepines: NOT DETECTED
Cocaine: NOT DETECTED
Opiates: NOT DETECTED
Tetrahydrocannabinol: NOT DETECTED

## 2019-05-02 LAB — COMPREHENSIVE METABOLIC PANEL
ALT: 12 U/L (ref 0–44)
AST: 15 U/L (ref 15–41)
Albumin: 4.4 g/dL (ref 3.5–5.0)
Alkaline Phosphatase: 48 U/L (ref 38–126)
Anion gap: 7 (ref 5–15)
BUN: 12 mg/dL (ref 6–20)
CO2: 24 mmol/L (ref 22–32)
Calcium: 9.3 mg/dL (ref 8.9–10.3)
Chloride: 108 mmol/L (ref 98–111)
Creatinine, Ser: 0.69 mg/dL (ref 0.44–1.00)
GFR calc Af Amer: >60 mL/min (ref >60)
GFR calc non Af Amer: >60 mL/min (ref >60)
Glucose, Bld: 103 mg/dL — ABNORMAL HIGH (ref 70–99)
Potassium: 3.6 mmol/L (ref 3.5–5.1)
Sodium: 139 mmol/L (ref 135–145)
Total Bilirubin: 0.3 mg/dL (ref 0.3–1.2)
Total Protein: 7.7 g/dL (ref 6.5–8.1)

## 2019-05-02 LAB — CBC
HCT: 35.7 % — ABNORMAL LOW (ref 36.0–46.0)
Hemoglobin: 11.6 g/dL — ABNORMAL LOW (ref 12.0–15.0)
MCH: 30.6 pg (ref 26.0–34.0)
MCHC: 32.5 g/dL (ref 30.0–36.0)
MCV: 94.2 fL (ref 80.0–100.0)
Platelets: 214 10*3/uL (ref 150–400)
RBC: 3.79 MIL/uL — ABNORMAL LOW (ref 3.87–5.11)
RDW: 13.6 % (ref 11.5–15.5)
WBC: 5.2 10*3/uL (ref 4.0–10.5)
nRBC: 0 % (ref 0.0–0.2)

## 2019-05-02 LAB — SARS CORONAVIRUS 2 BY RT PCR (HOSPITAL ORDER, PERFORMED IN ~~LOC~~ HOSPITAL LAB): SARS Coronavirus 2: NEGATIVE

## 2019-05-02 LAB — ACETAMINOPHEN LEVEL: Acetaminophen (Tylenol), Serum: <10 ug/mL — ABNORMAL LOW (ref 10–30)

## 2019-05-02 LAB — SALICYLATE LEVEL: Salicylate Lvl: <7 mg/dL (ref 2.8–30.0)

## 2019-05-02 LAB — ETHANOL: Alcohol, Ethyl (B): <10 mg/dL (ref <10)

## 2019-05-02 LAB — I-STAT BETA HCG BLOOD, ED (MC, WL, AP ONLY): I-stat hCG, quantitative: <5 m[IU]/mL (ref <5)

## 2019-05-02 MED ORDER — BENZTROPINE MESYLATE 0.5 MG PO TABS
0.5000 mg | ORAL_TABLET | Freq: Two times a day (BID) | ORAL | Status: DC
  Filled 2019-05-02: qty 1

## 2019-05-02 MED ORDER — DIVALPROEX SODIUM ER 250 MG PO TB24
750.0000 mg | ORAL_TABLET | Freq: Two times a day (BID) | ORAL | Status: DC
  Filled 2019-05-02: qty 3

## 2019-05-02 MED ORDER — OLANZAPINE 10 MG PO TABS
10.0000 mg | ORAL_TABLET | Freq: Two times a day (BID) | ORAL | Status: DC
  Filled 2019-05-02: qty 1

## 2019-05-02 MED ORDER — ARIPIPRAZOLE 10 MG PO TABS: 20.0000 mg | ORAL_TABLET | Freq: Every day | ORAL | Status: DC

## 2019-05-02 NOTE — ED Notes (Signed)
Patient is not complaint with medications

## 2019-05-02 NOTE — Progress Notes (Addendum)
Received [name] from triage, she changed out and was  scanned by security. She gave a urine specimen per request. She sat on the bed and verbally talked out loud to herself. She endorsed feeling suicidal and stated she has no problem dying. She eventually stopped talking and laid down to rest in her bed.

## 2019-05-02 NOTE — ED Notes (Signed)
Pt A&O x4, resting at present, monitoring for safety, no distress noted, calm & cooperative.

## 2019-05-02 NOTE — ED Triage Notes (Signed)
Patient came in by [facility]. Patient states she is going to stab herself with a knife. Patient wants to kill herself.

## 2019-05-02 NOTE — ED Provider Notes (Signed)
[facility] [facility] DEPT Provider Note   CSN: [id number] Arrival date & time: 05/02/19  0424     History   Chief Complaint Chief Complaint  Patient presents with  . Suicidal    HPI [name] is a 48 y.o. female.     48 year old female with history of diabetes, hypertension, bipolar and schizophrenic disorder, seizures presents to the emergency department via EMS.  She called 911 stating that she was having thoughts of killing herself by stabbing herself with a knife.  Has been noncompliant with her psychiatric medications.  History is somewhat difficult as speech is quite tangential and patient is whispering during most of the encounter.  She states that she is tired of all the experiments being done to her and she just wants to die.     Past Medical History:  Diagnosis Date  . Anemia   . Anxiety   . Asthma   . Bipolar 1 disorder ([place])   . Breast cancer ([place])   . Depression   . Diabetes mellitus without complication ([place])   . Hypertension   . Insomnia, persistent   . Schizophrenic disorder ([place])   . Seizures [facility])     Patient Active Problem List   Diagnosis Date Noted  . Schizophrenia ([place]) 10/04/2018  . Encounter for medical clearance for patient hold   . Noncompliance with treatment 07/16/2015  . Schizoaffective disorder, bipolar type ([place]) 01/11/2015    Past Surgical History:  Procedure Laterality Date  . BREAST SURGERY Left   . TONSILLECTOMY       OB History    Gravida  2   Para  2   Term  2   Preterm      AB      Living  2     SAB      TAB      Ectopic      Multiple      Live Births  2            Home Medications    Prior to Admission medications   Medication Sig Start Date End Date Taking? Authorizing Provider  ARIPiprazole (ABILIFY) 20 MG tablet Take 20 mg by mouth daily. 03/17/19  Yes [provider]  ARISTADA 662 MG/2.4ML PRSY Inject 2.4 mLs into the skin every 30 (thirty) days. 03/24/19   Yes [provider]  benztropine (COGENTIN) 0.5 MG tablet Take 1 tablet (0.5 mg total) by mouth 2 (two) times daily. 12/24/18  Yes [name], MD  divalproex (DEPAKOTE) 250 MG DR tablet Take 3 tablets (750 mg total) by mouth 2 (two) times a day. Patient taking differently: Take 500 mg by mouth 2 (two) times a day.  12/24/18  Yes [name], MD  traZODone (DESYREL) 100 MG tablet Take 1 tablet (100 mg total) by mouth at bedtime as needed for sleep. 10/08/18  Yes Lindell Spar I, NP  albuterol (PROVENTIL HFA;VENTOLIN HFA) 108 (90 Base) MCG/ACT inhaler Inhale 2 puffs into the lungs every 6 (six) hours as needed for wheezing or shortness of breath. 10/08/18   Lindell Spar I, NP  budesonide-formoterol (SYMBICORT) 160-4.5 MCG/ACT inhaler Inhale 2 puffs into the lungs 2 (two) times daily. For Shortness of breath 10/08/18   Lindell Spar I, NP  ondansetron (ZOFRAN) 4 MG tablet Take 1 tablet (4 mg total) by mouth every 6 (six) hours as needed for nausea. Patient not taking: Reported on [id number]   Delora Fuel, MD  risperiDONE (RISPERDAL) 3 MG  tablet 1 in am 2 at hs Patient taking differently: Take 3-6 mg by mouth See admin instructions. 1 tablets in am 2 tablets at night 12/24/18   [name], MD  ferrous sulfate 325 (65 FE) MG tablet Take 1 tablet (325 mg total) by mouth daily with breakfast. Patient not taking: Reported on 04/20/2019 12/24/18 04/20/19  [name], MD    Family History Family History  Problem Relation Age of Onset  . Depression Mother   . Gout Mother   . Cancer Father        prostate  . Other Father        lung issue  . Alcoholism Other   . Heart attack Paternal Grandfather   . Heart attack Paternal Grandmother   . Heart attack Maternal Grandmother   . Heart attack Maternal Grandfather   . Depression Son   . Anxiety disorder Son     Social History Social History   Tobacco Use  . Smoking status: Former Smoker    Types: Cigarettes  . Smokeless tobacco: Never  Used  Substance Use Topics  . Alcohol use: Yes    Comment: BAC not available at time of assessment  . Drug use: No     Allergies   Haldol [haloperidol decanoate], Haloperidol, Penicillins, Pollen extract, and Shrimp [shellfish allergy]   Review of Systems Review of Systems Ten systems reviewed and are negative for acute change, except as noted in the HPI.    Physical Exam Updated Vital Signs BP 133/86 (BP Location: Left Arm)   Pulse 68   Temp 98.3 F (36.8 C) (Oral)   Resp 18   SpO2 100%   Physical Exam Vitals signs and nursing note reviewed.  Constitutional:      General: She is not in acute distress.    Appearance: She is well-developed. She is not diaphoretic.  HENT:     Head: Normocephalic and atraumatic.  Eyes:     General: No scleral icterus.    Conjunctiva/sclera: Conjunctivae normal.  Neck:     Musculoskeletal: Normal range of motion.  Pulmonary:     Effort: Pulmonary effort is normal. No respiratory distress.  Musculoskeletal: Normal range of motion.  Skin:    General: Skin is warm and dry.     Coloration: Skin is not pale.     Findings: No erythema or rash.  Neurological:     Mental Status: She is alert and oriented to person, place, and time.  Psychiatric:        Mood and Affect: Mood is depressed. Affect is tearful.        Speech: Speech is tangential.        Behavior: Behavior is withdrawn.        Thought Content: Thought content includes suicidal ideation. Thought content includes suicidal plan.      ED Treatments / Results  Labs (all labs ordered are listed, but only abnormal results are displayed) Labs Reviewed  COMPREHENSIVE METABOLIC PANEL - Abnormal; Notable for the following components:      Result Value   Glucose, Bld 103 (*)    All other components within normal limits  ACETAMINOPHEN LEVEL - Abnormal; Notable for the following components:   Acetaminophen (Tylenol), Serum <10 (*)    All other components within normal limits  CBC  - Abnormal; Notable for the following components:   RBC 3.79 (*)    Hemoglobin 11.6 (*)    HCT 35.7 (*)    All other components within normal  limits  ETHANOL  SALICYLATE LEVEL  RAPID URINE DRUG SCREEN, HOSP PERFORMED  I-STAT BETA HCG BLOOD, ED ([facility], [facility], [facility] ONLY)    EKG None  Radiology No results found.  Procedures Procedures (including critical care time)  Medications Ordered in ED Medications - No data to display   Initial Impression / Assessment and Plan / ED Course  I have reviewed the triage vital signs and the nursing notes.  Pertinent labs & imaging results that were available during my care of the patient were reviewed by me and considered in my medical decision making (see chart for details).        Patient presenting for depression with suicidal ideations.  Expresses plan to stab herself with a knife.  She has been medically cleared and is pending TTS assessment.  Disposition to be determined by oncoming ED provider.   Final Clinical Impressions(s) / ED Diagnoses   Final diagnoses:  Suicidal ideation  Noncompliance with medication regimen    ED Discharge Orders    None       Antonietta Breach, PA-C 05/02/19 [id number]    Ripley Fraise, MD 05/02/19 [phone]

## 2019-05-02 NOTE — ED Notes (Signed)
Faxed Covid result to [facility]

## 2019-05-02 NOTE — ED Notes (Signed)
Sheriff reports that they will transport pt in the am and will give a 30 minute notice.

## 2019-05-02 NOTE — ED Notes (Signed)
[place] ONE PURSE PUT INTO LOCKER 35.

## 2019-05-02 NOTE — BH Assessment (Addendum)
[facility] Assessment Progress Note  Per Buford Dresser, DO, this pt requires psychiatric hospitalization at this time.  Dr Mariea Clonts also finds that pt meets criteria for IVC, which she has initiated.  IVC documents have been faxed to [facility], and at 12:55 Lattie Corns confirms receipt.  He has since faxed Findings and Custody Order to this Probation officer.  At 13:22 I called [employer] and spoke to [employer], who took demographic information, agreeing to dispatch law enforcement to fill out Return of Service.  Law enforcement then presented at [employer].  At 15:19 Pamala Hurry calls from [facility] to report that pt has tentatively been accepted to their facility by Dr Chesley Noon, however, this is contingent upon pt resulting negative on a current Covid test.  When test results are available, please fax them to [phone], after which pt will go to their [facility], Rm 248.  Jinny Blossom, PMHNP concurs with this decision.  Marylou Flesher, CSW has been notified, as has pt's nurse, Diane, who agrees to call report to [phone] when the time comes.  [facility] stipulates that pt must arrive either before 23:00 tonight, or after 06:00 tomorrow.  Pt is to be transported via [facility].  [place] Coordinator [phone]

## 2019-05-02 NOTE — BH Assessment (Addendum)
Tele Assessment Note   Patient Name: [name] MRN: [id number] Referring Physician: Dr. Lacretia Leigh Location of Patient: [facility] ED Location of Provider: [place] is a 48 y.o. female who was brought to [facility] via EMS due to pt making threats to kill herself with a knife. Pt was difficult to hear/understand, as she was whispering information to clinician over a Tele-Assessment machine, but pt explained that she was feeling sad and that she feels as if she can't do better. Pt states she has no friends and no family.  Pt endorsed SI with a plan, stating she has attempted to kill herself in the past and that she has been hospitalized for mental health reasons in the past. Pt shared she has access to one gun at home but no other weapons. Pt states she has no therapist and no psychiatrist, though clinician was able to find in pt's chart that she currently has both through an ACT Team through [facility].  Pt denied having friends or family that clinician could contact for collateral information.  Pt was oriented x4. Pt's memory was UTA. Pt was as cooperative as possible under the circumstances. Pt's insight, judgement, and impulse control was UTA.   Diagnosis: F31.9, Bipolar I disorder, Current or most recent episode unspecified   Past Medical History:  Past Medical History:  Diagnosis Date  . Anemia   . Anxiety   . Asthma   . Bipolar 1 disorder ([place])   . Breast cancer ([place])   . Depression   . Diabetes mellitus without complication ([place])   . Hypertension   . Insomnia, persistent   . Schizophrenic disorder ([place])   . Seizures ([place])     Past Surgical History:  Procedure Laterality Date  . BREAST SURGERY Left   . TONSILLECTOMY      Family History:  Family History  Problem Relation Age of Onset  . Depression Mother   . Gout Mother   . Cancer Father        prostate  . Other Father        lung issue  . Alcoholism Other   . Heart attack  Paternal Grandfather   . Heart attack Paternal Grandmother   . Heart attack Maternal Grandmother   . Heart attack Maternal Grandfather   . Depression Son   . Anxiety disorder Son     Social History:  reports that she has quit smoking. Her smoking use included cigarettes. She has never used smokeless tobacco. She reports current alcohol use. She reports that she does not use drugs.  Additional Social History:  Alcohol / Drug Use Pain Medications: Please see MAR Prescriptions: Please see MAR Over the Counter: Please see MAR History of alcohol / drug use?: (UTA) Longest period of sobriety (when/how long): UTA  CIWA: CIWA-Ar BP: 133/86 Pulse Rate: 68 COWS:    Allergies:  Allergies  Allergen Reactions  . Haldol [Haloperidol Decanoate] Other (See Comments)    Stiffness, eyes bulging  . Haloperidol   . Penicillins Nausea And Vomiting    Has patient had a PCN reaction causing immediate rash, facial/tongue/throat swelling, SOB or lightheadedness with hypotension:UNSURE  Has patient had a PCN reaction causing severe rash involving mucus membranes or skin necrosis: UNSURE Has patient had a PCN reaction that required hospitalization:UNSURE Has patient had a PCN reaction occurring within the last 10 years:No If all of the above answers are "NO", then may proceed with Cephalosporin use. CHILDHOOD REACTION  . Pollen  Extract Other (See Comments)    Seasonal allergies  . Shrimp [Shellfish Allergy] Rash    Home Medications: (Not in a hospital admission)   OB/GYN Status:  No LMP recorded.  General Assessment Data [facility]: [facility] TTS Assessment: In system Is this a Tele or Face-to-Face Assessment?: Tele Assessment Is this an Initial Assessment or a Re-assessment for this encounter?: Initial Assessment Patient Accompanied by:: N/A Language Other than English: No Living Arrangements: Other (Comment)(Pt lives in her own apartment) What gender do you identify as?:  Female Marital status: Single Maiden name: Mura Pregnancy Status: No Living Arrangements: Alone Can pt return to current living arrangement?: Yes Admission Status: Voluntary Is patient capable of signing voluntary admission?: Yes Referral Source: Self/Family/Friend Insurance type: [employer]     Crisis Care Plan Living Arrangements: Alone Legal Guardian: Other:(Self) Name of Psychiatrist: [facility] Name of Therapist: [facility]  Education Status Is patient currently in school?: No Is the patient employed, unemployed or receiving disability?: Receiving disability income  Risk to self with the past 6 months Suicidal Ideation: Yes-Currently Present Has patient been a risk to self within the past 6 months prior to admission? : Yes Suicidal Intent: Yes-Currently Present Has patient had any suicidal intent within the past 6 months prior to admission? : Yes Is patient at risk for suicide?: Yes Suicidal Plan?: Yes-Currently Present Has patient had any suicidal plan within the past 6 months prior to admission? : Yes Specify Current Suicidal Plan: Pt plans to stab herself with a knife Access to Means: Yes Specify Access to Suicidal Means: Pt has access to knives What has been your use of drugs/alcohol within the last 12 months?: UTA Previous Attempts/Gestures: Yes How many times?: 3 Other Self Harm Risks: Pt is currently unable to provide much information Triggers for Past Attempts: Unpredictable Intentional Self Injurious Behavior: (UTA) Family Suicide History: Unable to assess Recent stressful life event(s): (UTA) Persecutory voices/beliefs?: Yes Depression: Yes Depression Symptoms: Despondent, Isolating, Loss of interest in usual pleasures Substance abuse history and/or treatment for substance abuse?: No Suicide prevention information given to non-admitted patients: Not applicable  Risk to Others within the past 6 months Homicidal Ideation:  No Does patient have any lifetime risk of violence toward others beyond the six months prior to admission? : No Thoughts of Harm to Others: No Current Homicidal Intent: No Current Homicidal Plan: No Access to Homicidal Means: No Identified Victim: None noted History of harm to others?: No Assessment of Violence: None Noted Violent Behavior Description: None noted Does patient have access to weapons?: Yes (Comment)(Pt states she has access to one gun) Criminal Charges Pending?: No Does patient have a court date: No Is patient on probation?: No  Psychosis Hallucinations: (UTA) Delusions: (UTA)  Mental Status Report Appearance/Hygiene: In scrubs Eye Contact: Poor Motor Activity: Freedom of movement(Pt was lying on her back on her hospital bed) Speech: Other (Comment)(Minimal, in a loud whisper) Level of Consciousness: Quiet/awake Mood: Anxious, Helpless Affect: Flat, Constricted Anxiety Level: Minimal Thought Processes: Thought Blocking Judgement: Impaired Orientation: Person, Place, Time, Situation Obsessive Compulsive Thoughts/Behaviors: None  Cognitive Functioning Concentration: Normal Memory: Unable to Assess Is patient IDD: No Insight: Unable to Assess Impulse Control: Unable to Assess Appetite: (UTA) Have you had any weight changes? : (UTA) Sleep: Unable to Assess Total Hours of Sleep: (UTA) Vegetative Symptoms: Unable to Assess  ADLScreening [facility] Assessment Services) [name] cognitive ability adequate to safely complete daily activities?: (UTA) Patient able to express need  for assistance with ADLs?: (UTA) Independently performs ADLs?: (UTA)  Prior Inpatient Therapy Prior Inpatient Therapy: Yes Prior Therapy Dates: 02/2019 and 11/2018 Prior Therapy Facilty/Provider(s): [name] Reason for Treatment: SI, MH  Prior Outpatient Therapy Prior Outpatient Therapy: No Does patient have an ACCT team?: Yes Does patient have Intensive In-House Services?  :  No Does patient have [facility] services? : Yes Does patient have P4CC services?: No  ADL Screening (condition at time of admission) [name] cognitive ability adequate to safely complete daily activities?: (UTA) Is the patient deaf or have difficulty hearing?: (UTA) Does the patient have difficulty seeing, even when wearing glasses/contacts?: (UTA) Does the patient have difficulty concentrating, remembering, or making decisions?: (UTA) Patient able to express need for assistance with ADLs?: (UTA) Does the patient have difficulty dressing or bathing?: (UTA) Independently performs ADLs?: (UTA) Does the patient have difficulty walking or climbing stairs?: (UTA) Weakness of Legs: (UTA) Weakness of Arms/Hands: (UTA)  Home Assistive Devices/Equipment Home Assistive Devices/Equipment: (UTA)  Therapy Consults (therapy consults require a physician order) PT Evaluation Needed: (UTA) OT Evalulation Needed: (UTA) SLP Evaluation Needed: (UTA) Abuse/Neglect Assessment (Assessment to be complete while patient is alone) Abuse/Neglect Assessment Can Be Completed: Unable to assess, patient is non-responsive or altered mental status Values / Beliefs Cultural Requests During Hospitalization: (UTA) Spiritual Requests During Hospitalization: (UTA) [place] Needed: 72) Social Work Consult Needed: (UT) Regulatory affairs officer ([place]) Does Patient Have a Medical Advance Directive?: Unable to assess, patient is non-responsive or altered mental status Would patient like information on creating a medical advance directive?: No - Patient declined       Disposition: Anette Riedel, NP, reviewed pt's chart and information and determined pt meets criteria for inpatient hospitalization. This information was provided to [place], Therapist, sports, at [phone]. Pt's referral information will be faxed to multiple hospitals for potential placement and will be reviewed by [name].   Disposition Initial Assessment Completed for this Encounter: Yes  This service was provided via telemedicine using a 2-way, interactive audio and video technology.  Names of all persons participating in this telemedicine service and their role in this encounter. Name: [name] Role: Patient  Name: Anette Riedel Role: Nurse Practitioner  Name: Windell Hummingbird Role: Clinician    Dannielle Burn 05/02/2019 6:40 AM

## 2019-05-02 NOTE — BHH Counselor (Signed)
05/02/2019: At 15:19 Pamala Hurry calls from [facility] to report that pt has tentatively been accepted to their facility by Dr Chesley Noon, however, this is contingent upon pt resulting negative on a current Covid test.  When test results are available, please fax them to [phone], after which pt will go to their [facility], Rm 248.  Results faxed to [facility] by nursing staff-Dianne.

## 2019-05-21 ENCOUNTER — Telehealth (HOSPITAL_COMMUNITY): Payer: Self-pay | Admitting: Professional

## 2019-05-21 ENCOUNTER — Ambulatory Visit (HOSPITAL_COMMUNITY): Payer: Medicaid Other

## 2019-06-29 ENCOUNTER — Other Ambulatory Visit: Payer: Self-pay

## 2019-06-29 ENCOUNTER — Emergency Department (HOSPITAL_COMMUNITY)
Admission: EM | Admit: 2019-06-29 | Discharge: 2019-06-29 | Disposition: A | Discharge status: Home / Self Care | Payer: 59

## 2019-06-29 ENCOUNTER — Encounter (HOSPITAL_COMMUNITY): Payer: Self-pay

## 2019-06-29 DIAGNOSIS — B373 Candidiasis of vulva and vagina: Secondary | ICD-10-CM | POA: Diagnosis not present

## 2019-06-29 DIAGNOSIS — I10 Essential (primary) hypertension: Secondary | ICD-10-CM | POA: Diagnosis not present

## 2019-06-29 DIAGNOSIS — E119 Type 2 diabetes mellitus without complications: Secondary | ICD-10-CM | POA: Insufficient documentation

## 2019-06-29 DIAGNOSIS — Z87891 Personal history of nicotine dependence: Secondary | ICD-10-CM | POA: Insufficient documentation

## 2019-06-29 DIAGNOSIS — M545 Low back pain: Secondary | ICD-10-CM | POA: Diagnosis present

## 2019-06-29 DIAGNOSIS — J45909 Unspecified asthma, uncomplicated: Secondary | ICD-10-CM | POA: Diagnosis not present

## 2019-06-29 DIAGNOSIS — Z79899 Other long term (current) drug therapy: Secondary | ICD-10-CM | POA: Insufficient documentation

## 2019-06-29 DIAGNOSIS — K649 Unspecified hemorrhoids: Secondary | ICD-10-CM | POA: Diagnosis not present

## 2019-06-29 DIAGNOSIS — N898 Other specified noninflammatory disorders of vagina: Secondary | ICD-10-CM | POA: Diagnosis not present

## 2019-06-29 NOTE — ED Triage Notes (Signed)
Pt arrived with [facility] with complaints of lower back pain, reports some spotting of vaginal blood in underwear but states she is perimenopausal.

## 2019-06-30 ENCOUNTER — Encounter (HOSPITAL_COMMUNITY): Payer: Self-pay | Admitting: Emergency Medicine

## 2019-06-30 ENCOUNTER — Emergency Department (HOSPITAL_COMMUNITY)
Admission: EM | Admit: 2019-06-30 | Discharge: 2019-06-30 | Disposition: A | Discharge status: Home / Self Care | Payer: 59 | Attending: Emergency Medicine | Admitting: Emergency Medicine

## 2019-06-30 DIAGNOSIS — M545 Low back pain, unspecified: Secondary | ICD-10-CM

## 2019-06-30 DIAGNOSIS — K649 Unspecified hemorrhoids: Secondary | ICD-10-CM

## 2019-06-30 DIAGNOSIS — B379 Candidiasis, unspecified: Secondary | ICD-10-CM

## 2019-06-30 LAB — URINALYSIS, ROUTINE W REFLEX MICROSCOPIC
Bilirubin Urine: NEGATIVE
Glucose, UA: NEGATIVE mg/dL
Hgb urine dipstick: NEGATIVE
Ketones, ur: NEGATIVE mg/dL
Leukocytes,Ua: NEGATIVE
Nitrite: NEGATIVE
Protein, ur: NEGATIVE mg/dL
Specific Gravity, Urine: 1.017 (ref 1.005–1.030)
pH: 6 (ref 5.0–8.0)

## 2019-06-30 LAB — WET PREP, GENITAL
Clue Cells Wet Prep HPF POC: NONE SEEN
Sperm: NONE SEEN
Trich, Wet Prep: NONE SEEN

## 2019-06-30 LAB — I-STAT BETA HCG BLOOD, ED (MC, WL, AP ONLY): I-stat hCG, quantitative: <5 m[IU]/mL (ref <5)

## 2019-06-30 MED ORDER — LIDOCAINE 5 % EX PTCH
1.0000 | MEDICATED_PATCH | CUTANEOUS | Status: DC
  Administered 2019-06-30: 03:00:00 1 via TRANSDERMAL
  Filled 2019-06-30: qty 1

## 2019-06-30 MED ORDER — ACETAMINOPHEN 500 MG PO TABS
1000.0000 mg | ORAL_TABLET | Freq: Once | ORAL | Status: AC
  Administered 2019-06-30: 1000 mg via ORAL
  Filled 2019-06-30: qty 2

## 2019-06-30 MED ORDER — FLUCONAZOLE 150 MG PO TABS: 150.0000 mg | ORAL_TABLET | Freq: Once | ORAL | 0 refills | Status: AC

## 2019-06-30 MED ORDER — LIDOCAINE 5 % EX PTCH: 1.0000 | MEDICATED_PATCH | CUTANEOUS | 0 refills | Status: DC

## 2019-06-30 MED ORDER — IBUPROFEN 800 MG PO TABS
800.0000 mg | ORAL_TABLET | Freq: Once | ORAL | Status: AC
  Administered 2019-06-30: 800 mg via ORAL
  Filled 2019-06-30: qty 1

## 2019-06-30 NOTE — ED Notes (Signed)
Assisted Dr. Randal Buba with pelvic exam. Pt tolerated well.

## 2019-06-30 NOTE — ED Provider Notes (Signed)
[facility] DEPT Provider Note   CSN: [id number] Arrival date & time: 06/29/19  2300     History   Chief Complaint Chief Complaint  Patient presents with  . Back Pain    HPI [name] is a 48 y.o. female.     The history is provided by the patient.  Back Pain Location:  Sacro-iliac joint Quality:  Stabbing Radiates to:  Does not radiate Pain severity:  Moderate Pain is:  Same all the time Onset quality:  Sudden Timing:  Constant Progression:  Unchanged Chronicity:  New Context: not emotional stress, not falling, not jumping from heights, not lifting heavy objects, not MCA, not MVA, not occupational injury, not pedestrian accident, not physical stress, not recent illness, not recent injury and not twisting   Relieved by:  Nothing Worsened by:  Nothing Ineffective treatments:  None tried Associated symptoms: no abdominal pain, no abdominal swelling, no bladder incontinence, no bowel incontinence, no chest pain, no dysuria, no fever, no headaches, no leg pain, no numbness, no paresthesias, no pelvic pain, no perianal numbness, no tingling, no weakness and no weight loss   Patient also reports spotting with wiping but had a normal period 10/16-21.  She wasn't sure if the bleeding was vaginal.  But she had stool when she saw the bleeding and she was straining to have a BM.    Past Medical History:  Diagnosis Date  . Anemia   . Anxiety   . Asthma   . Bipolar 1 disorder ([place])   . Breast cancer ([place])   . Depression   . Diabetes mellitus without complication ([place])   . Hypertension   . Insomnia, persistent   . Schizophrenic disorder ([place])   . Seizures [facility])     Patient Active Problem List   Diagnosis Date Noted  . Schizophrenia ([place]) 10/04/2018  . Encounter for medical clearance for patient hold   . Noncompliance with treatment 07/16/2015  . Schizoaffective disorder, bipolar type ([place]) 01/11/2015    Past Surgical History:  Procedure  Laterality Date  . BREAST SURGERY Left   . TONSILLECTOMY       OB History    Gravida  2   Para  2   Term  2   Preterm      AB      Living  2     SAB      TAB      Ectopic      Multiple      Live Births  2            Home Medications    Prior to Admission medications   Medication Sig Start Date End Date Taking? Authorizing Provider  benztropine (COGENTIN) 0.5 MG tablet Take 1 tablet (0.5 mg total) by mouth 2 (two) times daily. 12/24/18  Yes [name], MD  budesonide-formoterol [facility]) 160-4.5 MCG/ACT inhaler Inhale 2 puffs into the lungs 2 (two) times daily. For Shortness of breath 10/08/18  Yes Lindell Spar I, NP  divalproex (DEPAKOTE) 250 MG DR tablet Take 3 tablets (750 mg total) by mouth 2 (two) times a day. Patient taking differently: Take 500 mg by mouth 2 (two) times a day.  12/24/18  Yes [name], MD  INVEGA SUSTENNA 156 MG/ML SUSY injection Inject 156 mg as directed every 30 (thirty) days. 06/13/19  Yes [provider]  traZODone (DESYREL) 100 MG tablet Take 1 tablet (100 mg total) by mouth at bedtime as needed for sleep. 10/08/18  Yes Lindell Spar I, NP  albuterol (PROVENTIL HFA;VENTOLIN HFA) 108 (90 Base) MCG/ACT inhaler Inhale 2 puffs into the lungs every 6 (six) hours as needed for wheezing or shortness of breath. Patient not taking: Reported on 06/30/2019 10/08/18   Lindell Spar I, NP  ondansetron (ZOFRAN) 4 MG tablet Take 1 tablet (4 mg total) by mouth every 6 (six) hours as needed for nausea. Patient not taking: Reported on [id number]   Delora Fuel, MD  risperiDONE (RISPERDAL) 3 MG tablet 1 in am 2 at hs Patient not taking: Reported on 06/30/2019 12/24/18   [name], MD  ferrous sulfate 325 (65 FE) MG tablet Take 1 tablet (325 mg total) by mouth daily with breakfast. Patient not taking: Reported on 04/20/2019 12/24/18 04/20/19  [name], MD    Family History Family History  Problem Relation Age of Onset  . Depression  Mother   . Gout Mother   . Cancer Father        prostate  . Other Father        lung issue  . Alcoholism Other   . Heart attack Paternal Grandfather   . Heart attack Paternal Grandmother   . Heart attack Maternal Grandmother   . Heart attack Maternal Grandfather   . Depression Son   . Anxiety disorder Son     Social History Social History   Tobacco Use  . Smoking status: Former Smoker    Types: Cigarettes  . Smokeless tobacco: Never Used  Substance Use Topics  . Alcohol use: Yes    Comment: BAC not available at time of assessment  . Drug use: No     Allergies   Haldol [haloperidol decanoate], Haloperidol, Penicillins, Pollen extract, and Shrimp [shellfish allergy]   Review of Systems Review of Systems  Constitutional: Negative for fever and weight loss.  HENT: Negative for congestion.   Eyes: Negative for visual disturbance.  Respiratory: Negative for cough and shortness of breath.   Cardiovascular: Negative for chest pain.  Gastrointestinal: Negative for abdominal pain and bowel incontinence.  Genitourinary: Negative for bladder incontinence, dysuria and pelvic pain.  Musculoskeletal: Positive for back pain.  Neurological: Negative for tingling, weakness, numbness, headaches and paresthesias.  Psychiatric/Behavioral: Negative for agitation.  All other systems reviewed and are negative.    Physical Exam Updated Vital Signs BP 113/82   Pulse 72   Temp 97.8 F (36.6 C) (Oral)   Resp 17   SpO2 100%   Physical Exam Vitals signs and nursing note reviewed. Exam conducted with a chaperone present.  Constitutional:      General: She is not in acute distress.    Appearance: She is normal weight.  HENT:     Head: Normocephalic and atraumatic.     Nose: Nose normal.  Eyes:     Conjunctiva/sclera: Conjunctivae normal.     Pupils: Pupils are equal, round, and reactive to light.  Neck:     Musculoskeletal: Normal range of motion and neck supple.   Cardiovascular:     Rate and Rhythm: Normal rate and regular rhythm.     Pulses: Normal pulses.     Heart sounds: Normal heart sounds.  Pulmonary:     Effort: Pulmonary effort is normal.     Breath sounds: Normal breath sounds.  Abdominal:     General: Abdomen is flat. Bowel sounds are normal.     Tenderness: There is no abdominal tenderness. There is no guarding.  Genitourinary:    Exam position: Lithotomy position.  Vagina: Vaginal discharge present.     Comments: She has a hemorrhoid Musculoskeletal: Normal range of motion.  Skin:    General: Skin is warm and dry.     Capillary Refill: Capillary refill takes less than 2 seconds.  Neurological:     General: No focal deficit present.     Mental Status: She is alert and oriented to person, place, and time.     Deep Tendon Reflexes: Reflexes normal.  Psychiatric:        Mood and Affect: Mood normal.        Behavior: Behavior normal.      ED Treatments / Results  Labs (all labs ordered are listed, but only abnormal results are displayed) Results for orders placed or performed during the hospital encounter of 06/30/19  Wet prep, genital  Result Value Ref Range   Yeast Wet Prep HPF POC PRESENT (A) NONE SEEN   Trich, Wet Prep NONE SEEN NONE SEEN   Clue Cells Wet Prep HPF POC NONE SEEN NONE SEEN   WBC, Wet Prep HPF POC FEW (A) NONE SEEN   Sperm NONE SEEN   Urinalysis, Routine w reflex microscopic  Result Value Ref Range   Color, Urine YELLOW YELLOW   APPearance CLEAR CLEAR   Specific Gravity, Urine 1.017 1.005 - 1.030   pH 6.0 5.0 - 8.0   Glucose, UA NEGATIVE NEGATIVE mg/dL   Hgb urine dipstick NEGATIVE NEGATIVE   Bilirubin Urine NEGATIVE NEGATIVE   Ketones, ur NEGATIVE NEGATIVE mg/dL   Protein, ur NEGATIVE NEGATIVE mg/dL   Nitrite NEGATIVE NEGATIVE   Leukocytes,Ua NEGATIVE NEGATIVE  I-Stat Beta hCG blood, ED ([facility], [facility], [facility] only)  Result Value Ref Range   I-stat hCG, quantitative <5.0 <5 mIU/mL   Comment 3            No results found.  Radiology No results found.  Procedures Procedures (including critical care time)  Medications Ordered in ED Medications  lidocaine (LIDODERM) 5 % 1 patch (1 patch Transdermal Patch Applied 06/30/19 0238)  acetaminophen (TYLENOL) tablet 1,000 mg (1,000 mg Oral Given 06/30/19 0238)  ibuprofen (ADVIL) tablet 800 mg (800 mg Oral Given 06/30/19 0238)     Initial Impression / Assessment and Plan / ED Course  Bleeding appears to be hemorrhoidal in nature.  Will treat for back pain and yeast infection.   Shaira Malony was evaluated in Emergency Department on 06/30/2019 for the symptoms described in the history of present illness. She was evaluated in the context of the global COVID-19 pandemic, which necessitated consideration that the patient might be at risk for infection with the SARS-CoV-2 virus that causes COVID-19. Institutional protocols and algorithms that pertain to the evaluation of patients at risk for COVID-19 are in a state of rapid change based on information released by regulatory bodies including the CDC and federal and state organizations. These policies and algorithms were followed during the patient's care in the ED.  Final Clinical Impressions(s) / ED Diagnoses   Return for weakness, numbness, changes in vision or speech, fevers >100.4 unrelieved by medication, shortness of breath, intractable vomiting, or diarrhea, abdominal pain, Inability to tolerate liquids or food, cough, altered mental status or any concerns. No signs of systemic illness or infection. The patient is nontoxic-appearing on exam and vital signs are within normal limits.   I have reviewed the triage vital signs and the nursing notes. Pertinent labs &imaging results that were available during my care of the patient were reviewed by me and  considered in my medical decision making (see chart for details).  After history, exam, and medical workup I feel the patient has been  appropriately medically screened and is safe for discharge home. Pertinent diagnoses were discussed with the patient. Patient was given return precautions     Armonie Mettler, MD 06/30/19 [phone]

## 2019-06-30 NOTE — ED Notes (Signed)
Pelvic setup at bedside.

## 2019-06-30 NOTE — ED Notes (Signed)
Pt ambulated to RR without assistance to provide a urine sample.

## 2019-06-30 NOTE — ED Notes (Signed)
Pt ambulatory in room

## 2019-07-01 LAB — GC/CHLAMYDIA PROBE AMP (~~LOC~~) NOT AT ARMC
Chlamydia: NEGATIVE
Neisseria Gonorrhea: NEGATIVE

## 2019-08-24 ENCOUNTER — Encounter (HOSPITAL_COMMUNITY): Payer: Self-pay

## 2019-08-24 ENCOUNTER — Emergency Department (HOSPITAL_COMMUNITY)
Admission: EM | Admit: 2019-08-24 | Discharge: 2019-08-24 | Disposition: A | Discharge status: Home / Self Care | Payer: 59 | Attending: Emergency Medicine | Admitting: Emergency Medicine

## 2019-08-24 DIAGNOSIS — J45909 Unspecified asthma, uncomplicated: Secondary | ICD-10-CM | POA: Diagnosis not present

## 2019-08-24 DIAGNOSIS — R1110 Vomiting, unspecified: Secondary | ICD-10-CM | POA: Diagnosis not present

## 2019-08-24 DIAGNOSIS — Z87891 Personal history of nicotine dependence: Secondary | ICD-10-CM | POA: Insufficient documentation

## 2019-08-24 DIAGNOSIS — E119 Type 2 diabetes mellitus without complications: Secondary | ICD-10-CM | POA: Diagnosis not present

## 2019-08-24 DIAGNOSIS — G44209 Tension-type headache, unspecified, not intractable: Secondary | ICD-10-CM | POA: Insufficient documentation

## 2019-08-24 DIAGNOSIS — R197 Diarrhea, unspecified: Secondary | ICD-10-CM | POA: Insufficient documentation

## 2019-08-24 DIAGNOSIS — I10 Essential (primary) hypertension: Secondary | ICD-10-CM | POA: Diagnosis not present

## 2019-08-24 DIAGNOSIS — R519 Headache, unspecified: Secondary | ICD-10-CM | POA: Diagnosis present

## 2019-08-24 LAB — COMPREHENSIVE METABOLIC PANEL
ALT: 13 U/L (ref 0–44)
AST: 13 U/L — ABNORMAL LOW (ref 15–41)
Albumin: 3.9 g/dL (ref 3.5–5.0)
Alkaline Phosphatase: 51 U/L (ref 38–126)
Anion gap: 7 (ref 5–15)
BUN: 8 mg/dL (ref 6–20)
CO2: 27 mmol/L (ref 22–32)
Calcium: 8.9 mg/dL (ref 8.9–10.3)
Chloride: 105 mmol/L (ref 98–111)
Creatinine, Ser: 0.71 mg/dL (ref 0.44–1.00)
GFR calc Af Amer: >60 mL/min (ref >60)
GFR calc non Af Amer: >60 mL/min (ref >60)
Glucose, Bld: 95 mg/dL (ref 70–99)
Potassium: 3.8 mmol/L (ref 3.5–5.1)
Sodium: 139 mmol/L (ref 135–145)
Total Bilirubin: 0.9 mg/dL (ref 0.3–1.2)
Total Protein: 7 g/dL (ref 6.5–8.1)

## 2019-08-24 LAB — CBC WITH DIFFERENTIAL/PLATELET
Abs Immature Granulocytes: 0.01 10*3/uL (ref 0.00–0.07)
Basophils Absolute: 0 10*3/uL (ref 0.0–0.1)
Basophils Relative: 0 %
Eosinophils Absolute: 0.3 10*3/uL (ref 0.0–0.5)
Eosinophils Relative: 5 %
HCT: 36.5 % (ref 36.0–46.0)
Hemoglobin: 11.8 g/dL — ABNORMAL LOW (ref 12.0–15.0)
Immature Granulocytes: 0 %
Lymphocytes Relative: 44 %
Lymphs Abs: 2.5 10*3/uL (ref 0.7–4.0)
MCH: 30.7 pg (ref 26.0–34.0)
MCHC: 32.3 g/dL (ref 30.0–36.0)
MCV: 95.1 fL (ref 80.0–100.0)
Monocytes Absolute: 0.3 10*3/uL (ref 0.1–1.0)
Monocytes Relative: 6 %
Neutro Abs: 2.5 10*3/uL (ref 1.7–7.7)
Neutrophils Relative %: 45 %
Platelets: 204 10*3/uL (ref 150–400)
RBC: 3.84 MIL/uL — ABNORMAL LOW (ref 3.87–5.11)
RDW: 12.6 % (ref 11.5–15.5)
WBC: 5.5 10*3/uL (ref 4.0–10.5)
nRBC: 0 % (ref 0.0–0.2)

## 2019-08-24 LAB — I-STAT BETA HCG BLOOD, ED (MC, WL, AP ONLY): I-stat hCG, quantitative: <5 m[IU]/mL (ref <5)

## 2019-08-24 MED ORDER — ONDANSETRON HCL 4 MG/2ML IJ SOLN
4.0000 mg | Freq: Once | INTRAMUSCULAR | Status: AC
  Administered 2019-08-24: 21:00:00 4 mg via INTRAVENOUS
  Filled 2019-08-24: qty 2

## 2019-08-24 MED ORDER — ACETAMINOPHEN 325 MG PO TABS
650.0000 mg | ORAL_TABLET | Freq: Once | ORAL | Status: AC
  Administered 2019-08-24: 650 mg via ORAL
  Filled 2019-08-24: qty 2

## 2019-08-24 MED ORDER — IBUPROFEN 200 MG PO TABS
600.0000 mg | ORAL_TABLET | Freq: Once | ORAL | Status: AC
  Administered 2019-08-24: 600 mg via ORAL
  Filled 2019-08-24: qty 3

## 2019-08-24 MED ORDER — SODIUM CHLORIDE 0.9 % IV BOLUS
1000.0000 mL | Freq: Once | INTRAVENOUS | Status: AC
  Administered 2019-08-24: 20:00:00 1000 mL via INTRAVENOUS

## 2019-08-24 NOTE — ED Provider Notes (Signed)
[facility] [place] DEPT Provider Note   CSN: [id number] Arrival date & time: 08/24/19  1213     History Chief Complaint  Patient presents with  . Headache    [name] is a 48 y.o. female with a past medical history of diabetes, hypertension, bipolar disorder, schizophrenia presenting to the ED with a chief complaint of headache, diarrhea and emesis.  States that for the past 24 hours she has had tension type headache and several episodes of nonbloody diarrhea, nonbloody, nonbilious emesis.  States that her headache has improved with Tylenol.  She is unsure what triggered her GI symptoms.  Reports generalized abdominal discomfort but denies focal pain.  Denies any urinary symptoms, vaginal complaints, fever, sick contacts with similar symptoms, sudden onset of headache, vision changes, numbness in arms or legs, injuries or falls.  HPI     Past Medical History:  Diagnosis Date  . Anemia   . Anxiety   . Asthma   . Bipolar 1 disorder ([place])   . Breast cancer ([place])   . Depression   . Diabetes mellitus without complication ([place])   . Hypertension   . Insomnia, persistent   . Schizophrenic disorder ([place])   . Seizures [facility])     Patient Active Problem List   Diagnosis Date Noted  . Schizophrenia ([place]) 10/04/2018  . Encounter for medical clearance for patient hold   . Noncompliance with treatment 07/16/2015  . Schizoaffective disorder, bipolar type ([place]) 01/11/2015    Past Surgical History:  Procedure Laterality Date  . BREAST SURGERY Left   . TONSILLECTOMY       OB History    Gravida  2   Para  2   Term  2   Preterm      AB      Living  2     SAB      TAB      Ectopic      Multiple      Live Births  2           Family History  Problem Relation Age of Onset  . Depression Mother   . Gout Mother   . Cancer Father        prostate  . Other Father        lung issue  . Alcoholism Other   . Heart attack Paternal  Grandfather   . Heart attack Paternal Grandmother   . Heart attack Maternal Grandmother   . Heart attack Maternal Grandfather   . Depression Son   . Anxiety disorder Son     Social History   Tobacco Use  . Smoking status: Former Smoker    Types: Cigarettes  . Smokeless tobacco: Never Used  Substance Use Topics  . Alcohol use: Yes    Comment: BAC not available at time of assessment  . Drug use: No    Home Medications Prior to Admission medications   Medication Sig Start Date End Date Taking? Authorizing Provider  lidocaine (LIDODERM) 5 % Place 1 patch onto the skin daily. Remove & Discard patch within 12 hours or as directed by MD 06/30/19  Yes Palumbo, April, MD  traZODone (DESYREL) 100 MG tablet Take 1 tablet (100 mg total) by mouth at bedtime as needed for sleep. 10/08/18  Yes Lindell Spar I, NP  albuterol (PROVENTIL HFA;VENTOLIN HFA) 108 (90 Base) MCG/ACT inhaler Inhale 2 puffs into the lungs every 6 (six) hours as needed for wheezing or shortness of breath. Patient  not taking: Reported on 06/30/2019 10/08/18   Lindell Spar I, NP  benztropine (COGENTIN) 0.5 MG tablet Take 1 tablet (0.5 mg total) by mouth 2 (two) times daily. 12/24/18   [name], MD  budesonide-formoterol [facility]) 160-4.5 MCG/ACT inhaler Inhale 2 puffs into the lungs 2 (two) times daily. For Shortness of breath 10/08/18   Lindell Spar I, NP  divalproex (DEPAKOTE) 250 MG DR tablet Take 3 tablets (750 mg total) by mouth 2 (two) times a day. Patient not taking: Reported on 08/24/2019 12/24/18   [name], MD  divalproex (DEPAKOTE) 500 MG DR tablet Take 500 mg by mouth 2 (two) times daily. 07/30/19   [provider]  INVEGA SUSTENNA 156 MG/ML SUSY injection Inject 156 mg as directed every 30 (thirty) days. 06/13/19   [provider]  ondansetron (ZOFRAN) 4 MG tablet Take 1 tablet (4 mg total) by mouth every 6 (six) hours as needed for nausea. Patient not taking: Reported on [id number]    Delora Fuel, MD  risperiDONE (RISPERDAL) 3 MG tablet 1 in am 2 at hs Patient not taking: Reported on 06/30/2019 12/24/18   [name], MD  ferrous sulfate 325 (65 FE) MG tablet Take 1 tablet (325 mg total) by mouth daily with breakfast. Patient not taking: Reported on 04/20/2019 12/24/18 04/20/19  [name], MD    Allergies    Haldol [haloperidol decanoate], Haloperidol, Penicillins, Pollen extract, and Shrimp [shellfish allergy]  Review of Systems   Review of Systems  Constitutional: Negative for appetite change, chills and fever.  HENT: Negative for ear pain, rhinorrhea, sneezing and sore throat.   Eyes: Negative for photophobia and visual disturbance.  Respiratory: Negative for cough, chest tightness, shortness of breath and wheezing.   Cardiovascular: Negative for chest pain and palpitations.  Gastrointestinal: Positive for abdominal pain, diarrhea, nausea and vomiting. Negative for blood in stool and constipation.  Genitourinary: Negative for dysuria, hematuria and urgency.  Musculoskeletal: Negative for myalgias.  Skin: Negative for rash.  Neurological: Positive for headaches. Negative for dizziness, weakness and light-headedness.    Physical Exam Updated Vital Signs BP (!) 124/94   Pulse 60   Temp 98 F (36.7 C)   Resp 16   LMP 08/10/2019   SpO2 100%   Physical Exam Vitals and nursing note reviewed.  Constitutional:      General: She is not in acute distress.    Appearance: She is well-developed.  HENT:     Head: Normocephalic and atraumatic.     Nose: Nose normal.  Eyes:     General: No scleral icterus.       Left eye: No discharge.     Conjunctiva/sclera: Conjunctivae normal.  Cardiovascular:     Rate and Rhythm: Normal rate and regular rhythm.     Heart sounds: Normal heart sounds. No murmur. No friction rub. No gallop.   Pulmonary:     Effort: Pulmonary effort is normal. No respiratory distress.     Breath sounds: Normal breath sounds.  Abdominal:      General: Bowel sounds are normal. There is no distension.     Palpations: Abdomen is soft.     Tenderness: There is no abdominal tenderness. There is no guarding.  Musculoskeletal:        General: Normal range of motion.     Cervical back: Normal range of motion and neck supple.  Skin:    General: Skin is warm and dry.     Findings: No rash.  Neurological:  General: No focal deficit present.     Mental Status: She is alert and oriented to person, place, and time.     Cranial Nerves: No cranial nerve deficit.     Sensory: No sensory deficit.     Motor: No weakness or abnormal muscle tone.     Coordination: Coordination normal.     ED Results / Procedures / Treatments   Labs (all labs ordered are listed, but only abnormal results are displayed) Labs Reviewed  COMPREHENSIVE METABOLIC PANEL - Abnormal; Notable for the following components:      Result Value   AST 13 (*)    All other components within normal limits  CBC WITH DIFFERENTIAL/PLATELET - Abnormal; Notable for the following components:   RBC 3.84 (*)    Hemoglobin 11.8 (*)    All other components within normal limits  I-STAT BETA HCG BLOOD, ED ([facility], [facility], [facility] ONLY)    EKG None  Radiology No results found.  Procedures Procedures (including critical care time)  Medications Ordered in ED Medications  sodium chloride 0.9 % bolus 1,000 mL (0 mLs Intravenous Stopped 08/24/19 2057)  acetaminophen (TYLENOL) tablet 650 mg (650 mg Oral Given 08/24/19 2056)  ibuprofen (ADVIL) tablet 600 mg (600 mg Oral Given 08/24/19 2055)  ondansetron (ZOFRAN) injection 4 mg (4 mg Intravenous Given 08/24/19 2053)    ED Course  I have reviewed the triage vital signs and the nursing notes.  Pertinent labs & imaging results that were available during my care of the patient were reviewed by me and considered in my medical decision making (see chart for details).    MDM Rules/Calculators/A&P                      48 year old female  with a past medical history of diabetes, hypertension, bipolar disorder and schizophrenia presenting to the ED with headache, diarrhea and emesis.  Headache improved with Tylenol.  On exam abdomen is soft, nontender nondistended.  No deficits neurological exam noted.  Her vital signs are within normal limits.  No neck stiffness noted.  She had a recent negative Covid test.  Lab work including CBC, CMP, hCG unremarkable.  Patient was given antiemetics, Tylenol and ibuprofen with improvement in her symptoms.  She is able to tolerate p.o. intake without difficulty prior to discharge. There are no headache characteristics that are lateralizing or concerning for increased ICP, infectious or vascular cause of her symptoms.  Suspect viral cause of GI symptoms.  We will have her slowly advance her diet, continue Tylenol ibuprofen return for worsening symptoms.  Patient is hemodynamically stable, in NAD, and able to ambulate in the ED. Evaluation does not show pathology that would require ongoing emergent intervention or inpatient treatment. I explained the diagnosis to the patient. Pain has been managed and has no complaints prior to discharge. Patient is comfortable with above plan and is stable for discharge at this time. All questions were answered prior to disposition. Strict return precautions for returning to the ED were discussed. Encouraged follow up with PCP.   An After Visit Summary was printed and given to the patient.   Portions of this note were generated with [employer]. Dictation errors may occur despite best attempts at proofreading.  Final Clinical Impression(s) / ED Diagnoses Final diagnoses:  Acute non intractable tension-type headache    Rx / DC Orders ED Discharge Orders    None       Delia Heady, PA-C 08/24/19 2156  Sherwood Gambler, MD 08/24/19 2329

## 2019-08-24 NOTE — Discharge Instructions (Addendum)
You can take Tylenol and ibuprofen as needed for your headache. Return to the ED if you start to have worsening symptoms, neck stiffness, vomiting or coughing up blood, chest pain or shortness of breath.

## 2019-08-24 NOTE — ED Triage Notes (Signed)
Pt BIBA from home. Pt c/o headache, mostly related to stress. Pt states that she is having some mental issues, but denies SI/HI.

## 2019-08-24 NOTE — ED Notes (Signed)
Discharge instructions and follow up care reviewed. Pt A/Ox4 and ambulatory at time of discharge.

## 2019-09-27 ENCOUNTER — Inpatient Hospital Stay (HOSPITAL_COMMUNITY): Admit: 2019-09-27 | Payer: 59 | Admitting: Psychiatry

## 2019-09-27 ENCOUNTER — Other Ambulatory Visit: Payer: Self-pay

## 2019-09-27 ENCOUNTER — Encounter (HOSPITAL_COMMUNITY): Payer: Self-pay | Admitting: Psychiatry

## 2019-09-27 ENCOUNTER — Inpatient Hospital Stay (HOSPITAL_COMMUNITY)
Admission: AD | Admit: 2019-09-27 | Discharge: 2019-10-01 | DRG: 885 | Disposition: A | Discharge status: Home / Self Care | Payer: 59 | Attending: Psychiatry | Admitting: Psychiatry

## 2019-09-27 DIAGNOSIS — R45851 Suicidal ideations: Secondary | ICD-10-CM | POA: Diagnosis present

## 2019-09-27 DIAGNOSIS — Z818 Family history of other mental and behavioral disorders: Secondary | ICD-10-CM | POA: Diagnosis not present

## 2019-09-27 DIAGNOSIS — Z880 Allergy status to penicillin: Secondary | ICD-10-CM | POA: Diagnosis not present

## 2019-09-27 DIAGNOSIS — F250 Schizoaffective disorder, bipolar type: Secondary | ICD-10-CM | POA: Diagnosis present

## 2019-09-27 DIAGNOSIS — Z20822 Contact with and (suspected) exposure to covid-19: Secondary | ICD-10-CM | POA: Diagnosis present

## 2019-09-27 DIAGNOSIS — I10 Essential (primary) hypertension: Secondary | ICD-10-CM | POA: Diagnosis present

## 2019-09-27 DIAGNOSIS — G4700 Insomnia, unspecified: Secondary | ICD-10-CM | POA: Diagnosis present

## 2019-09-27 DIAGNOSIS — Z87891 Personal history of nicotine dependence: Secondary | ICD-10-CM

## 2019-09-27 DIAGNOSIS — E119 Type 2 diabetes mellitus without complications: Secondary | ICD-10-CM | POA: Diagnosis present

## 2019-09-27 DIAGNOSIS — J449 Chronic obstructive pulmonary disease, unspecified: Secondary | ICD-10-CM | POA: Diagnosis present

## 2019-09-27 DIAGNOSIS — F209 Schizophrenia, unspecified: Secondary | ICD-10-CM | POA: Diagnosis present

## 2019-09-27 DIAGNOSIS — Z9119 Patient's noncompliance with other medical treatment and regimen: Secondary | ICD-10-CM | POA: Diagnosis not present

## 2019-09-27 DIAGNOSIS — F201 Disorganized schizophrenia: Secondary | ICD-10-CM | POA: Diagnosis not present

## 2019-09-27 DIAGNOSIS — F200 Paranoid schizophrenia: Secondary | ICD-10-CM | POA: Diagnosis not present

## 2019-09-27 LAB — RESPIRATORY PANEL BY RT PCR (FLU A&B, COVID)
Influenza A by PCR: NEGATIVE
Influenza B by PCR: NEGATIVE
SARS Coronavirus 2 by RT PCR: NEGATIVE

## 2019-09-27 LAB — VALPROIC ACID LEVEL: Valproic Acid Lvl: <10 ug/mL — ABNORMAL LOW (ref 50.0–100.0)

## 2019-09-27 MED ORDER — TRAZODONE HCL 50 MG PO TABS
50.0000 mg | ORAL_TABLET | Freq: Every evening | ORAL | Status: DC | PRN
  Administered 2019-09-30: 22:00:00 50 mg via ORAL
  Filled 2019-09-27: qty 1

## 2019-09-27 MED ORDER — FLUTICASONE PROPIONATE HFA 44 MCG/ACT IN AERO
2.0000 | INHALATION_SPRAY | Freq: Two times a day (BID) | RESPIRATORY_TRACT | Status: DC
  Administered 2019-09-27 – 2019-09-30 (×7): 2 via RESPIRATORY_TRACT
  Filled 2019-09-27: qty 10.6

## 2019-09-27 MED ORDER — HYDROXYZINE HCL 25 MG PO TABS
25.0000 mg | ORAL_TABLET | Freq: Three times a day (TID) | ORAL | Status: DC | PRN
  Administered 2019-09-29: 21:00:00 25 mg via ORAL
  Filled 2019-09-27: qty 1

## 2019-09-27 MED ORDER — BENZTROPINE MESYLATE 0.5 MG PO TABS
0.5000 mg | ORAL_TABLET | Freq: Every day | ORAL | Status: DC
  Administered 2019-09-27 – 2019-09-30 (×4): 0.5 mg via ORAL
  Filled 2019-09-27 (×8): qty 1

## 2019-09-27 MED ORDER — RISPERIDONE 3 MG PO TABS
3.0000 mg | ORAL_TABLET | Freq: Every day | ORAL | Status: DC
  Administered 2019-09-27: 21:00:00 3 mg via ORAL
  Filled 2019-09-27 (×2): qty 1

## 2019-09-27 MED ORDER — ACETAMINOPHEN 325 MG PO TABS
650.0000 mg | ORAL_TABLET | Freq: Four times a day (QID) | ORAL | Status: DC | PRN
  Administered 2019-09-27: 650 mg via ORAL
  Filled 2019-09-27: qty 2

## 2019-09-27 MED ORDER — MAGNESIUM HYDROXIDE 400 MG/5ML PO SUSP: 30.0000 mL | Freq: Every day | ORAL | Status: DC | PRN

## 2019-09-27 MED ORDER — ALUM & MAG HYDROXIDE-SIMETH 200-200-20 MG/5ML PO SUSP: 30.0000 mL | ORAL | Status: DC | PRN

## 2019-09-27 NOTE — BH Assessment (Addendum)
Assessment Note  [name] is an 49 y.o. female who presented to [name] with the police in a manic state with disorganized thoughts.  Patient is hyper-verbal and rambling. Patient states, "I am okay, I don't want to be in the hospital unless I need to be."  Patient states that she has been hearing muffled voices and talking to herself.  Patient states that she was really upset yesterday due to "life problems" and she states, "I need to make better decisions."  Patient states that she does not want to participate in society.  [name] thoughts are not organized and are not tracking. She is jumping from one subject to another.   Patient states that she does not want to make a mistake and do something bad.  Patient states that she does not trust people or her neighbors and she felt like something was going to happen so she called the police and they brought her to [facility].  Patient states that she has not had her medications for the past two to three days.  She states that her iron is low and that she has been given pills to take that have blood in them and she states that she has been scared to take them.  Patient states, "I cannot accept blood in medicine."  Patient denies any current drug or alcohol use, but states that she has abused alcohol in the past. Patient states, "I don't willingly do drugs, but my apartment door was open the other day." She states that she was having suicidal thoughts a couple days ago and states that she was feeling bad.  Patient states, "I put everything away so I would not harm myself."  Patient references putting up a knife so that she would not cut herself with it.  Patient has three prior suicide attempts and was last hospitalized at [facility] in September 2020.  Patient states that she is supposed to be followed by Donley Redder, but it unclear to this writer if she has been seen recently.  She most definitely non-compliant with her medications.  Patient states that she has not been  sleeping more than 2-3 hours per night, but states that her appetite is good.  Patient states that she does not have "belemia or anorexia."  [name] judgment, insight and impulse control are impaired, her thoughts are very disorganized and her speech is pressured and she is hyper-verbal.  She appears to be manic.  Her clothing is disheveled and her affect is flat.  She does not currently appear to be responding to any internal stimuli.  Patient is oriented and alert x 4.   Diagnosis: F25.0 Schizoaffective Disorder  Past Medical History:  Past Medical History:  Diagnosis Date  . Anemia   . Anxiety   . Asthma   . Bipolar 1 disorder ([place])   . Breast cancer ([place])   . Depression   . Diabetes mellitus without complication ([place])   . Hypertension   . Insomnia, persistent   . Schizophrenic disorder ([place])   . Seizures ([place])     Past Surgical History:  Procedure Laterality Date  . BREAST SURGERY Left   . TONSILLECTOMY      Family History:  Family History  Problem Relation Age of Onset  . Depression Mother   . Gout Mother   . Cancer Father        prostate  . Other Father        lung issue  . Alcoholism Other   . Heart  attack Paternal Grandfather   . Heart attack Paternal Grandmother   . Heart attack Maternal Grandmother   . Heart attack Maternal Grandfather   . Depression Son   . Anxiety disorder Son     Social History:  reports that she has quit smoking. Her smoking use included cigarettes. She has never used smokeless tobacco. She reports previous alcohol use. She reports that she does not use drugs.  Additional Social History:  Alcohol / Drug Use Pain Medications: Please see MAR Prescriptions: Please see MAR Over the Counter: Please see MAR History of alcohol / drug use?: No history of alcohol / drug abuse(denies any current usage) Longest period of sobriety (when/how long): UTA  CIWA:   COWS:    Allergies:  Allergies  Allergen Reactions  . Haldol [Haloperidol  Decanoate] Other (See Comments)    Stiffness, eyes bulging  . Haloperidol   . Penicillins Nausea And Vomiting    Has patient had a PCN reaction causing immediate rash, facial/tongue/throat swelling, SOB or lightheadedness with hypotension:UNSURE  Has patient had a PCN reaction causing severe rash involving mucus membranes or skin necrosis: UNSURE Has patient had a PCN reaction that required hospitalization:UNSURE Has patient had a PCN reaction occurring within the last 10 years:No If all of the above answers are "NO", then may proceed with Cephalosporin use. CHILDHOOD REACTION  . Pollen Extract Other (See Comments)    Seasonal allergies  . Shrimp [Shellfish Allergy] Rash    Home Medications: (Not in a hospital admission)   OB/GYN Status:  No LMP recorded. Patient is perimenopausal.  General Assessment Data Location of Assessment: [facility] Assessment Services TTS Assessment: In system Is this a Tele or Face-to-Face Assessment?: Face-to-Face Is this an Initial Assessment or a Re-assessment for this encounter?: Initial Assessment Patient Accompanied by:: Other(police) Language Other than English: No Living Arrangements: Other (Comment)(lives on her own in her own apartment) What gender do you identify as?: Female Marital status: Single Living Arrangements: Alone Can pt return to current living arrangement?: Yes Admission Status: Voluntary Is patient capable of signing voluntary admission?: Yes Referral Source: Self/Family/Friend Insurance type: UHC Medicare Complete and Medicaid  Medical Screening Exam ([place]) Medical Exam completed: Yes  Crisis Care Plan Living Arrangements: Alone Legal Guardian: Other:(self) Name of Psychiatrist: [name] Name of Therapist: [name]  Education Status Is patient currently in school?: No Is the patient employed, unemployed or receiving disability?: Receiving disability income  Risk to self with the past 6 months Suicidal  Ideation: Yes-Currently Present Has patient been a risk to self within the past 6 months prior to admission? : Yes Suicidal Intent: No Has patient had any suicidal intent within the past 6 months prior to admission? : Yes Is patient at risk for suicide?: Yes Suicidal Plan?: Yes-Currently Present Has patient had any suicidal plan within the past 6 months prior to admission? : Yes Specify Current Suicidal Plan: cut self Access to Means: Yes Specify Access to Suicidal Means: knife What has been your use of drugs/alcohol within the last 12 months?: none reported Previous Attempts/Gestures: Yes How many times?: 3 Other Self Harm Risks: psychosis Triggers for Past Attempts: None known Intentional Self Injurious Behavior: None Family Suicide History: No Recent stressful life event(s): Conflict (Comment)(with neighbors) Persecutory voices/beliefs?: Yes Depression: Yes Depression Symptoms: Despondent, Insomnia, Feeling worthless/self pity, Feeling angry/irritable Substance abuse history and/or treatment for substance abuse?: No Suicide prevention information given to non-admitted patients: Not applicable  Risk to Others within the past 6 months Homicidal  Ideation: No Does patient have any lifetime risk of violence toward others beyond the six months prior to admission? : No Thoughts of Harm to Others: No Current Homicidal Intent: No Current Homicidal Plan: No Access to Homicidal Means: No Identified Victim: none History of harm to others?: No Assessment of Violence: None Noted Violent Behavior Description: none Does patient have access to weapons?: (knives) Criminal Charges Pending?: No Does patient have a court date: No Is patient on probation?: No  Psychosis Hallucinations: Auditory, Visual Delusions: (paranoid)  Mental Status Report Appearance/Hygiene: Disheveled Eye Contact: Good Motor Activity: Freedom of movement Speech: Pressured Level of Consciousness: Alert Mood:  Depressed Affect: Blunted, Depressed, Flat Anxiety Level: Moderate Thought Processes: Coherent, Relevant Judgement: Impaired Orientation: Person, Place, Time, Situation Obsessive Compulsive Thoughts/Behaviors: None  Cognitive Functioning Concentration: Decreased Memory: Recent Intact, Remote Intact Is patient IDD: No Insight: Poor Impulse Control: Poor Appetite: Poor Have you had any weight changes? : No Change Sleep: Decreased Total Hours of Sleep: (2-3 hours) Vegetative Symptoms: None  ADLScreening [facility] Assessment Services) [name] cognitive ability adequate to safely complete daily activities?: Yes Patient able to express need for assistance with ADLs?: Yes Independently performs ADLs?: Yes (appropriate for developmental age)  Prior Inpatient Therapy Prior Inpatient Therapy: Yes Prior Therapy Dates: 04/2019 Prior Therapy Facilty/Provider(s): [facility] Reason for Treatment: (schizo-affective disorder)  Prior Outpatient Therapy Prior Outpatient Therapy: Yes Prior Therapy Dates: active Prior Therapy Facilty/Provider(s): [name] Reason for Treatment: schizoaffective Does patient have an ACCT team?: Yes Does patient have Intensive In-House Services?  : No Does patient have [facility] services? : No Does patient have P4CC services?: No  ADL Screening (condition at time of admission) [name] cognitive ability adequate to safely complete daily activities?: Yes Is the patient deaf or have difficulty hearing?: No Does the patient have difficulty seeing, even when wearing glasses/contacts?: No Does the patient have difficulty concentrating, remembering, or making decisions?: No Patient able to express need for assistance with ADLs?: Yes Does the patient have difficulty dressing or bathing?: No Independently performs ADLs?: Yes (appropriate for developmental age) Does the patient have difficulty walking or climbing stairs?: No Weakness of Legs: None Weakness of Arms/Hands:  None  Home Assistive Devices/Equipment Home Assistive Devices/Equipment: None  Therapy Consults (therapy consults require a physician order) PT Evaluation Needed: No OT Evalulation Needed: No SLP Evaluation Needed: No Abuse/Neglect Assessment (Assessment to be complete while patient is alone) Abuse/Neglect Assessment Can Be Completed: Unable to assess, patient is non-responsive or altered mental status   Consults Spiritual Care Consult Needed: No Transition of Care Team Consult Needed: No Advance Directives (For Healthcare) Does Patient Have a Medical Advance Directive?: No Would patient like information on creating a medical advance directive?: No - Patient declined Nutrition Screen- [facility] Adult/WL/AP Has the patient recently lost weight without trying?: No Has the patient been eating poorly because of a decreased appetite?: No Malnutrition Screening Tool Score: 0        Disposition: Per Ricky Ala, NP, Patient meets inpatient admission criteria, but [facility] has no appropriate beds.  Patient will be admitted to OBS and re-assessed in the morning. Disposition Initial Assessment Completed for this Encounter: Yes Disposition of Patient: Admit Type of inpatient treatment program: Adult  On Site Evaluation by:   Reviewed with Physician:    Judeth Porch Mikell Kazlauskas 09/27/2019 2:02 PM
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
## 2019-09-27 NOTE — H&P (Signed)
Behavioral Health Medical Screening Exam  [name] is an 49 y.o. female.[name] is a 49 y/o female well know to this service. Patient has  history of schizoaffective bipolar. Reporting suicidal ideation, anxiety and worthlessness. Patient presenting pressured, paranoia and delusional. Patient to be admitted for inpatient admission. Support, encouragement and reassurances was provided.   Total Time spent with patient: 15 minutes  Psychiatric Specialty Exam: Physical Exam  Psychiatric: She has a normal mood and affect. Her behavior is normal.    Review of Systems  Psychiatric/Behavioral: Positive for agitation, decreased concentration, hallucinations, sleep disturbance and suicidal ideas. Negative for behavioral problems. The patient is nervous/anxious.   All other systems reviewed and are negative.   There were no vitals taken for this visit.There is no height or weight on file to calculate BMI.  General Appearance: Casual  Eye Contact:  Minimal  Speech:  Clear and Coherent  Volume:  Normal  Mood:  Anxious and Depressed  Affect:  Labile  Thought Process:  Disorganized  Orientation:  Other:  Person and place  Thought Content:  Hallucinations: Auditory and Paranoid Ideation  Suicidal Thoughts:  Yes.  without intent/plan  Homicidal Thoughts:  No  Memory:  Immediate;   Poor Recent;   Poor  Judgement:  Poor  Insight:  Lacking  Psychomotor Activity:  Normal  Concentration: Concentration: Fair  Recall:  [place]: Fair  Akathisia:  No  Handed:  Right  AIMS (if indicated):     Assets:  Communication Skills Desire for Improvement Resilience Social Support  Sleep:       Musculoskeletal: Strength & Muscle Tone: within normal limits Gait & Station: normal Patient leans: N/A  There were no vitals taken for this visit.  Recommendations: Inpatient admission  Based on my evaluation the patient does not appear to have an emergency medical  condition.  Derrill [place], NP 09/27/2019, 1:25 PM

## 2019-09-27 NOTE — Progress Notes (Signed)
Pt safely transferred from the observation unit to the adult unit. Pt denies active SI/HI. Pt presents with disorganized thoughts, flight of ideas and loose association when conveying thoughts.   Per the assessment note:[name] is an 49 y.o. female who presented to [facility] with the police in a manic state with disorganized thoughts.  Patient is hyper-verbal and rambling. Patient states, "I am okay, I don't want to be in the hospital unless I need to be."  Patient states that she has been hearing muffled voices and talking to herself.  Patient states that she was really upset yesterday due to "life problems" and she states, "I need to make better decisions."  Patient states that she does not want to participate in society.  [name] thoughts are not organized and are not tracking. She is jumping from one subject to another.   Patient states that she does not want to make a mistake and do something bad.  Patient states that she does not trust people or her neighbors and she felt like something was going to happen so she called the police and they brought her to [facility].  Patient states that she has not had her medications for the past two to three days.  She states that her iron is low and that she has been given pills to take that have blood in them and she states that she has been scared to take them.  Patient states, "I cannot accept blood in medicine."  Patient denies any current drug or alcohol use, but states that she has abused alcohol in the past. Patient states, "I don't willingly do drugs, but my apartment door was open the other day." She states that she was having suicidal thoughts a couple days ago and states that she was feeling bad.  Patient states, "I put everything away so I would not harm myself."  Patient references putting up a knife so that she would not cut herself with it.  Patient has three prior suicide attempts and was last hospitalized at [facility] in September 2020.  Patient states that she  is supposed to be followed by Donley Redder, but it unclear to this writer if she has been seen recently.  She most definitely non-compliant with her medications.  Patient states that she has not been sleeping more than 2-3 hours per night, but states that her appetite is good.  Patient states that she does not have "belemia or anorexia."  [name] judgment, insight and impulse control are impaired, her thoughts are very disorganized and her speech is pressured and she is hyper-verbal.  She appears to be manic.  Her clothing is disheveled and her affect is flat.  She does not currently appear to be responding to any internal stimuli.  Patient is oriented and alert x 4.

## 2019-09-27 NOTE — Progress Notes (Signed)
   09/27/19 2056  COVID-19 Daily Checkoff  Have you had a fever (temp > 37.80C/100F)  in the past 24 hours?  No  If you have had runny nose, nasal congestion, sneezing in the past 24 hours, has it worsened? No  COVID-19 EXPOSURE  Have you traveled outside the state in the past 14 days? No  Have you been in contact with someone with a confirmed diagnosis of COVID-19 or PUI in the past 14 days without wearing appropriate PPE? No  Have you been living in the same home as a person with confirmed diagnosis of COVID-19 or a PUI (household contact)? No  Have you been diagnosed with COVID-19? No

## 2019-09-28 DIAGNOSIS — F201 Disorganized schizophrenia: Secondary | ICD-10-CM

## 2019-09-28 LAB — COMPREHENSIVE METABOLIC PANEL
ALT: 12 U/L (ref 0–44)
AST: 13 U/L — ABNORMAL LOW (ref 15–41)
Albumin: 4 g/dL (ref 3.5–5.0)
Alkaline Phosphatase: 57 U/L (ref 38–126)
Anion gap: 9 (ref 5–15)
BUN: 13 mg/dL (ref 6–20)
CO2: 26 mmol/L (ref 22–32)
Calcium: 9.4 mg/dL (ref 8.9–10.3)
Chloride: 104 mmol/L (ref 98–111)
Creatinine, Ser: 0.72 mg/dL (ref 0.44–1.00)
GFR calc Af Amer: >60 mL/min (ref >60)
GFR calc non Af Amer: >60 mL/min (ref >60)
Glucose, Bld: 115 mg/dL — ABNORMAL HIGH (ref 70–99)
Potassium: 4 mmol/L (ref 3.5–5.1)
Sodium: 139 mmol/L (ref 135–145)
Total Bilirubin: 0.3 mg/dL (ref 0.3–1.2)
Total Protein: 7.6 g/dL (ref 6.5–8.1)

## 2019-09-28 LAB — LIPID PANEL
Cholesterol: 186 mg/dL (ref 0–200)
HDL: 52 mg/dL (ref >40)
LDL Cholesterol: 123 mg/dL — ABNORMAL HIGH (ref 0–99)
Total CHOL/HDL Ratio: 3.6 RATIO
Triglycerides: 56 mg/dL (ref <150)
VLDL: 11 mg/dL (ref 0–40)

## 2019-09-28 LAB — CBC
HCT: 38.6 % (ref 36.0–46.0)
Hemoglobin: 12.4 g/dL (ref 12.0–15.0)
MCH: 30.3 pg (ref 26.0–34.0)
MCHC: 32.1 g/dL (ref 30.0–36.0)
MCV: 94.4 fL (ref 80.0–100.0)
Platelets: 237 10*3/uL (ref 150–400)
RBC: 4.09 MIL/uL (ref 3.87–5.11)
RDW: 12.4 % (ref 11.5–15.5)
WBC: 4.2 10*3/uL (ref 4.0–10.5)
nRBC: 0 % (ref 0.0–0.2)

## 2019-09-28 LAB — TSH: TSH: 0.707 u[IU]/mL (ref 0.350–4.500)

## 2019-09-28 MED ORDER — PALIPERIDONE ER 6 MG PO TB24
6.0000 mg | ORAL_TABLET | Freq: Every day | ORAL | Status: DC
  Administered 2019-09-28 – 2019-09-30 (×3): 6 mg via ORAL
  Filled 2019-09-28 (×5): qty 1

## 2019-09-28 MED ORDER — PALIPERIDONE PALMITATE ER 234 MG/1.5ML IM SUSY
234.0000 mg | PREFILLED_SYRINGE | Freq: Once | INTRAMUSCULAR | Status: AC
  Administered 2019-09-29: 11:00:00 234 mg via INTRAMUSCULAR
  Filled 2019-09-28: qty 1.5

## 2019-09-28 MED ORDER — DIVALPROEX SODIUM 500 MG PO DR TAB
500.0000 mg | DELAYED_RELEASE_TABLET | Freq: Two times a day (BID) | ORAL | Status: DC
  Administered 2019-09-28 – 2019-10-01 (×7): 500 mg via ORAL
  Filled 2019-09-28 (×13): qty 1

## 2019-09-28 NOTE — BHH Suicide Risk Assessment (Signed)
[facility] Admission Suicide Risk Assessment   Nursing information obtained from:  Patient Demographic factors:  Unemployed, Living alone, Low socioeconomic status Current Mental Status:  NA Loss Factors:  NA Historical Factors:  NA Risk Reduction Factors:  NA  Total Time spent with patient: 45 minutes Principal Problem: <principal problem not specified> Diagnosis:  Active Problems:   Schizophrenia (HCC)  Subjective Data: Patient is seen and examined.  Patient is a 49 year old female with a past psychiatric history significant for schizoaffective disorder; bipolar type who presented directly to the behavioral health hospital on 09/27/2019 and what was considered a manic state with disorganized thoughts.  She was hyperverbal and rambling at that time.  The patient admitted also at that time that she had been having auditory hallucinations.  She has been treated recently by the [facility] [name] service, but does not appear to have been seen recently.  She is supposed to be taking several medications including Depakote, but her Depakote level on admission was essentially 0.  The patient did receive Risperdal while in observation last night, and seems calm her today.  She remains somewhat disorganized, and very vague on her symptoms as well as her previous treatment.  The last admission then I was able to find was in July 2020.  She was hospitalized in [facility].  Her medications at this time consisted of buspirone, Depakote extended release, the Abilify long-acting injection as well as oral Abilify.  She also has a history of asthma, and was taking medications for that as well.  An emergency room visit on 08/24/2019 for an intractable headache showed her medications included trazodone, albuterol, lidocaine patch, Cogentin, Depakote DR, the long-acting paliperidone injection and oral Risperdal.  Review of her admission laboratories revealed a mildly elevated glucose, but essentially normal electrolytes as well as  CBC.  Her lipids were normal.  Her Depakote level was less than 10.  TSH was 0.707.  Drug screen is not been obtained as of yet.  She was admitted to the hospital for evaluation and stabilization.  Continued Clinical Symptoms:  Alcohol Use Disorder Identification Test Final Score (AUDIT): 0 The "Alcohol Use Disorders Identification Test", Guidelines for Use in Primary Care, Second Edition.  World Pharmacologist [facility]). Score between 0-7:  no or low risk or alcohol related problems. Score between 8-15:  moderate risk of alcohol related problems. Score between 16-19:  high risk of alcohol related problems. Score 20 or above:  warrants further diagnostic evaluation for alcohol dependence and treatment.   CLINICAL FACTORS:   Bipolar Disorder:   Mixed State Schizophrenia:   Depressive state   Musculoskeletal: Strength & Muscle Tone: within normal limits Gait & Station: normal Patient leans: N/A  Psychiatric Specialty Exam: Physical Exam  Nursing note and vitals reviewed. Constitutional: She is oriented to person, place, and time. She appears well-developed and well-nourished.  HENT:  Head: Normocephalic and atraumatic.  Respiratory: Effort normal.  Neurological: She is alert and oriented to person, place, and time.    Review of Systems  Blood pressure (!) 118/96, pulse (!) 109, temperature 98.1 F (36.7 C), temperature source Oral, resp. rate 20, height 5\' 6"  (1.676 m), weight 85.3 kg, SpO2 100 %.Body mass index is 30.34 kg/m.  General Appearance: Disheveled  Eye Contact:  Fair  Speech:  Normal Rate  Volume:  Decreased  Mood:  Dysphoric  Affect:  Congruent  Thought Process:  Coherent and Descriptions of Associations: Circumstantial  Orientation:  Full (Time, Place, and Person)  Thought Content:  Delusions and Hallucinations: Auditory  Suicidal Thoughts:  No  Homicidal Thoughts:  No  Memory:  Immediate;   Poor Recent;   Poor Remote;   Poor  Judgement:  Impaired   Insight:  Lacking  Psychomotor Activity:  Normal  Concentration:  Concentration: Poor and Attention Span: Poor  Recall:  Poor  Fund of Knowledge:  Fair  Language:  Fair  Akathisia:  Negative  Handed:  Right  AIMS (if indicated):     Assets:  Desire for Improvement Resilience  ADL's:  Intact  Cognition:  WNL  Sleep:  Number of Hours: 6.75      COGNITIVE FEATURES THAT CONTRIBUTE TO RISK:  None    SUICIDE RISK:   Minimal: No identifiable suicidal ideation.  Patients presenting with no risk factors but with morbid ruminations; may be classified as minimal risk based on the severity of the depressive symptoms  PLAN OF CARE: Patient is seen and examined.  Patient is a 49 year old female with the above-stated past medical and psychiatric history who was admitted secondary to disorganization and psychotic symptoms.  She will be admitted to the hospital.  She will be integrated in the milieu.  She will be encouraged to attend groups.  I am going to restart her Depakote DR at 500 mg p.o. twice daily.  Given that she most recently has had the long-acting paliperidone injection per the notes I will go on and schedule this for tomorrow, but we will have pharmacy attempt to find out her medications.  I will go on and place her on oral paliperidone for now.  Hopefully we can make contact with her [name] team and find out an accurate list of her medicines.  Her inhalers will be continued for her asthma symptoms as well.  I certify that inpatient services furnished can reasonably be expected to improve the patient's condition.   Sharma Covert, MD 09/28/2019, 9:26 AM

## 2019-09-28 NOTE — H&P (Signed)
Psychiatric Admission Assessment Adult  Patient Identification: [name] MRN:  [id number] Date of Evaluation:  09/28/2019 Chief Complaint:  Schizophrenia ([facility]) [F20.9] Principal Diagnosis: <principal problem not specified> Diagnosis:  Active Problems:   Schizophrenia ([place])  History of Present Illness: Patient is seen and examined.  Patient is a 49 year old female with a past psychiatric history significant for schizoaffective disorder; bipolar type who presented directly to the behavioral health hospital on 09/27/2019 and what was considered a manic state with disorganized thoughts.  She was hyperverbal and rambling at that time.  The patient admitted also at that time that she had been having auditory hallucinations.  She has been treated recently by the [facility] [name] service, but does not appear to have been seen recently.  She is supposed to be taking several medications including Depakote, but her Depakote level on admission was essentially 0.  The patient did receive Risperdal while in observation last night, and seems calm her today.  She remains somewhat disorganized, and very vague on her symptoms as well as her previous treatment.  The last admission then I was able to find was in July 2020.  She was hospitalized in [facility].  Her medications at this time consisted of buspirone, Depakote extended release, the Abilify long-acting injection as well as oral Abilify.  She also has a history of asthma, and was taking medications for that as well.  An emergency room visit on 08/24/2019 for an intractable headache showed her medications included trazodone, albuterol, lidocaine patch, Cogentin, Depakote DR, the long-acting paliperidone injection and oral Risperdal.  Review of her admission laboratories revealed a mildly elevated glucose, but essentially normal electrolytes as well as CBC.  Her lipids were normal.  Her Depakote level was less than 10.  TSH was 0.707.  Drug screen is not been obtained as  of yet.  She was admitted to the hospital for evaluation and stabilization.  Associated Signs/Symptoms: Depression Symptoms:  depressed mood, anhedonia, insomnia, psychomotor agitation, fatigue, feelings of worthlessness/guilt, difficulty concentrating, suicidal thoughts without plan, anxiety, loss of energy/fatigue, disturbed sleep, (Hypo) Manic Symptoms:  Delusions, Distractibility, Elevated Mood, Flight of Ideas, Impulsivity, Irritable Mood, Labiality of Mood, Anxiety Symptoms:  Excessive Worry, Psychotic Symptoms:  Delusions, Hallucinations: Auditory PTSD Symptoms: Negative Total Time spent with patient: 45 minutes  Past Psychiatric History: Patient has previously been diagnosed with schizoaffective disorder, she has been on Abilify long-acting as well as Abilify tablets.  She has been on lithium, Haldol.  She had an allergic reaction to Haldol decanoate.  She has been admitted to the hospital on multiple occasions.  Her last psychiatric hospitalization appears to have been in July 2020 at [facility] in [place].  Is the patient at risk to self? Yes.    Has the patient been a risk to self in the past 6 months? Yes.    Has the patient been a risk to self within the distant past? No.  Is the patient a risk to others? No.  Has the patient been a risk to others in the past 6 months? No.  Has the patient been a risk to others within the distant past? No.   Prior Inpatient Therapy: Prior Inpatient Therapy: Yes Prior Therapy Dates: 04/2019 Prior Therapy Facilty/Provider(s): [facility] Reason for Treatment: (schizo-affective disorder) Prior Outpatient Therapy: Prior Outpatient Therapy: Yes Prior Therapy Dates: active Prior Therapy Facilty/Provider(s): [name] Reason for Treatment: schizoaffective Does patient have an ACCT team?: Yes Does patient have Intensive In-House Services?  :  No Does patient have [facility] services? : No Does patient have [facility]  services?: No  Alcohol Screening: 1. How often do you have a drink containing alcohol?: Never 2. How many drinks containing alcohol do you have on a typical day when you are drinking?: 1 or 2 3. How often do you have six or more drinks on one occasion?: Never AUDIT-C Score: 0 4. How often during the last year have you found that you were not able to stop drinking once you had started?: Never 5. How often during the last year have you failed to do what was normally expected from you becasue of drinking?: Never 6. How often during the last year have you needed a first drink in the morning to get yourself going after a heavy drinking session?: Never 7. How often during the last year have you had a feeling of guilt of remorse after drinking?: Never 8. How often during the last year have you been unable to remember what happened the night before because you had been drinking?: Never 9. Have you or someone else been injured as a result of your drinking?: No 10. Has a relative or friend or a doctor or another health worker been concerned about your drinking or suggested you cut down?: No Alcohol Use Disorder Identification Test Final Score (AUDIT): 0 Substance Abuse History in the last 12 months:  No. Consequences of Substance Abuse: Negative Previous Psychotropic Medications: Yes  Psychological Evaluations: Yes  Past Medical History:  Past Medical History:  Diagnosis Date  . Anemia   . Anxiety   . Asthma   . Bipolar 1 disorder ([place])   . Breast cancer ([place])   . Depression   . Diabetes mellitus without complication ([place])   . Hypertension   . Insomnia, persistent   . Schizophrenic disorder ([place])   . Seizures ([place])     Past Surgical History:  Procedure Laterality Date  . BREAST SURGERY Left   . TONSILLECTOMY     Family History:  Family History  Problem Relation Age of Onset  . Depression Mother   . Gout Mother   . Cancer Father        prostate  . Other Father        lung issue   . Alcoholism Other   . Heart attack Paternal Grandfather   . Heart attack Paternal Grandmother   . Heart attack Maternal Grandmother   . Heart attack Maternal Grandfather   . Depression Son   . Anxiety disorder Son    Family Psychiatric  History: Noncontributory Tobacco Screening:   Social History:  Social History   Substance and Sexual Activity  Alcohol Use Not Currently     Social History   Substance and Sexual Activity  Drug Use No    Additional Social History: Marital status: Separated Separated, when?: 2013- " I think he went to the [employer]" What types of issues is patient dealing with in the relationship?: He was a female not transgendered, his family was slaves in [place], [place] Are you sexually active?: No What is your sexual orientation?: heterosexual Does patient have children?: Yes How many children?: 56(52 year old son, 53 year old daughter) How is patient's relationship with their children?: describes the relationship as good.    Pain Medications: Please see MAR Prescriptions: Please see MAR Over the Counter: Please see MAR History of alcohol / drug use?: No history of alcohol / drug abuse(denies any current usage) Longest period of sobriety (when/how long): UTA  Allergies:   Allergies  Allergen Reactions  . Haldol [Haloperidol Decanoate] Other (See Comments)    Stiffness, eyes bulging  . Haloperidol   . Penicillins Nausea And Vomiting    Has patient had a PCN reaction causing immediate rash, facial/tongue/throat swelling, SOB or lightheadedness with hypotension:UNSURE  Has patient had a PCN reaction causing severe rash involving mucus membranes or skin necrosis: UNSURE Has patient had a PCN reaction that required hospitalization:UNSURE Has patient had a PCN reaction occurring within the last 10 years:No If all of the above answers are "NO", then may proceed with Cephalosporin use. CHILDHOOD REACTION  . Pollen Extract Other  (See Comments)    Seasonal allergies  . Shrimp [Shellfish Allergy] Rash   Lab Results:  Results for orders placed or performed during the hospital encounter of 09/27/19 (from the past 48 hour(s))  Respiratory Panel by RT PCR (Flu A&B, Covid) - Nasopharyngeal Swab     Status: None   Collection Time: 09/27/19  2:27 PM   Specimen: Nasopharyngeal Swab  Result Value Ref Range   SARS Coronavirus 2 by RT PCR NEGATIVE NEGATIVE    Comment: (NOTE) SARS-CoV-2 target nucleic acids are NOT DETECTED. The SARS-CoV-2 RNA is generally detectable in upper respiratoy specimens during the acute phase of infection. The lowest concentration of SARS-CoV-2 viral copies this assay can detect is 131 copies/mL. A negative result does not preclude SARS-Cov-2 infection and should not be used as the sole basis for treatment or other patient management decisions. A negative result may occur with  improper specimen collection/handling, submission of specimen other than nasopharyngeal swab, presence of viral mutation(s) within the areas targeted by this assay, and inadequate number of viral copies (<131 copies/mL). A negative result must be combined with clinical observations, patient history, and epidemiological information. The expected result is Negative. Fact Sheet for Patients:  PinkCheek.be Fact Sheet for Healthcare Providers:  GravelBags.it This test is not yet ap proved or cleared by the [place] FDA and  has been authorized for detection and/or diagnosis of SARS-CoV-2 by FDA under an Emergency Use Authorization (EUA). This EUA will remain  in effect (meaning this test can be used) for the duration of the COVID-19 declaration under Section 564(b)(1) of the Act, 21 U.S.C. section 360bbb-3(b)(1), unless the authorization is terminated or revoked sooner.    Influenza A by PCR NEGATIVE NEGATIVE   Influenza B by PCR NEGATIVE NEGATIVE    Comment:  (NOTE) The Xpert Xpress SARS-CoV-2/FLU/RSV assay is intended as an aid in  the diagnosis of influenza from Nasopharyngeal swab specimens and  should not be used as a sole basis for treatment. Nasal washings and  aspirates are unacceptable for Xpert Xpress SARS-CoV-2/FLU/RSV  testing. Fact Sheet for Patients: PinkCheek.be Fact Sheet for Healthcare Providers: GravelBags.it This test is not yet approved or cleared by the [place] FDA and  has been authorized for detection and/or diagnosis of SARS-CoV-2 by  FDA under an Emergency Use Authorization (EUA). This EUA will remain  in effect (meaning this test can be used) for the duration of the  Covid-19 declaration under Section 564(b)(1) of the Act, 21  U.S.C. section 360bbb-3(b)(1), unless the authorization is  terminated or revoked. Performed at [facility], [place] [address] [place], [place] [address]   Valproic acid level     Status: Abnormal   Collection Time: 09/27/19  2:29 PM  Result Value Ref Range   Valproic Acid Lvl <10 (L) 50.0 - 100.0 ug/mL    Comment:  RESULTS CONFIRMED BY MANUAL DILUTION Performed at [place] [address], [place], [place] [address]   CBC     Status: None   Collection Time: 09/28/19  6:46 AM  Result Value Ref Range   WBC 4.2 4.0 - 10.5 K/uL   RBC 4.09 3.87 - 5.11 MIL/uL   Hemoglobin 12.4 12.0 - 15.0 g/dL   HCT 38.6 36.0 - 46.0 %   MCV 94.4 80.0 - 100.0 fL   MCH 30.3 26.0 - 34.0 pg   MCHC 32.1 30.0 - 36.0 g/dL   RDW 12.4 11.5 - 15.5 %   Platelets 237 150 - 400 K/uL   nRBC 0.0 0.0 - 0.2 %    Comment: Performed at [facility], [place] [address] [place], [place] [address]  Comprehensive metabolic panel     Status: Abnormal   Collection Time: 09/28/19  6:46 AM  Result Value Ref Range   Sodium 139 135 - 145 mmol/L   Potassium 4.0 3.5 - 5.1 mmol/L   Chloride 104 98 - 111 mmol/L    CO2 26 22 - 32 mmol/L   Glucose, Bld 115 (H) 70 - 99 mg/dL   BUN 13 6 - 20 mg/dL   Creatinine, Ser 0.72 0.44 - 1.00 mg/dL   Calcium 9.4 8.9 - 10.3 mg/dL   Total Protein 7.6 6.5 - 8.1 g/dL   Albumin 4.0 3.5 - 5.0 g/dL   AST 13 (L) 15 - 41 U/L   ALT 12 0 - 44 U/L   Alkaline Phosphatase 57 38 - 126 U/L   Total Bilirubin 0.3 0.3 - 1.2 mg/dL   GFR calc non Af Amer >60 >60 mL/min   GFR calc Af Amer >60 >60 mL/min   Anion gap 9 5 - 15    Comment: Performed at [facility], [place] [address], [place], [place] [address]  Lipid panel     Status: Abnormal   Collection Time: 09/28/19  6:46 AM  Result Value Ref Range   Cholesterol 186 0 - 200 mg/dL   Triglycerides 56 <150 mg/dL   HDL 52 >40 mg/dL   Total CHOL/HDL Ratio 3.6 RATIO   VLDL 11 0 - 40 mg/dL   LDL Cholesterol 123 (H) 0 - 99 mg/dL    Comment:        Total Cholesterol/HDL:CHD Risk Coronary Heart Disease Risk Table                     Men   Women  1/2 Average Risk   3.4   3.3  Average Risk       5.0   4.4  2 X Average Risk   9.6   7.1  3 X Average Risk  23.4   11.0        Use the calculated Patient Ratio above and the CHD Risk Table to determine the patient's CHD Risk.        ATP III CLASSIFICATION (LDL):  <100     mg/dL   Optimal  100-129  mg/dL   Near or Above                    Optimal  130-159  mg/dL   Borderline  160-189  mg/dL   High  >190     mg/dL   Very High Performed at [place] [address], [place],  [address]   TSH     Status: None  Collection Time: 09/28/19  6:46 AM  Result Value Ref Range   TSH 0.707 0.350 - 4.500 uIU/mL    Comment: Performed by a 3rd Generation assay with a functional sensitivity of <=0.01 uIU/mL. Performed at [facility], [place] [address] [place], [place] [address]     Blood Alcohol level:  Lab Results  Component Value Date   ETH <10 05/02/2019   ETH <10 [id number]    Metabolic Disorder Labs:  Lab Results   Component Value Date   HGBA1C 5.0 08/27/2018   MPG 96.8 08/27/2018   MPG 96.8 11/21/2017   Lab Results  Component Value Date   PROLACTIN 4.2 (L) 12/19/2018   PROLACTIN 7.5 08/27/2018   Lab Results  Component Value Date   CHOL 186 09/28/2019   TRIG 56 09/28/2019   HDL 52 09/28/2019   CHOLHDL 3.6 09/28/2019   VLDL 11 09/28/2019   LDLCALC 123 (H) 09/28/2019   LDLCALC 97 12/19/2018    Current Medications: Current Facility-Administered Medications  Medication Dose Route Frequency Provider Last Rate Last Admin  . acetaminophen (TYLENOL) tablet 650 mg  650 mg Oral Q6H PRN Derrill Center, NP   650 mg at 09/27/19 2034  . alum & mag hydroxide-simeth (MAALOX/MYLANTA) 200-200-20 MG/5ML suspension 30 mL  30 mL Oral Q4H PRN Derrill Center, NP      . benztropine (COGENTIN) tablet 0.5 mg  0.5 mg Oral QHS Derrill Center, NP   0.5 mg at 09/27/19 2033  . divalproex (DEPAKOTE) DR tablet 500 mg  500 mg Oral Q12H Jaidin Richison, Cordie Grice, MD      . fluticasone (FLOVENT HFA) 44 MCG/ACT inhaler 2 puff  2 puff Inhalation BID Derrill Center, NP   2 puff at 09/28/19 0759  . hydrOXYzine (ATARAX/VISTARIL) tablet 25 mg  25 mg Oral TID PRN Derrill Center, NP      . magnesium hydroxide (MILK OF MAGNESIA) suspension 30 mL  30 mL Oral Daily PRN Derrill Center, NP      . Derrill Memo ON 09/29/2019] paliperidone (INVEGA SUSTENNA) injection 234 mg  234 mg Intramuscular Once Sharma Covert, MD      . paliperidone (INVEGA) 24 hr tablet 6 mg  6 mg Oral QHS Sharma Covert, MD      . traZODone (DESYREL) tablet 50 mg  50 mg Oral QHS PRN Derrill Center, NP       PTA Medications: Medications Prior to Admission  Medication Sig Dispense Refill Last Dose  . albuterol (PROVENTIL HFA;VENTOLIN HFA) 108 (90 Base) MCG/ACT inhaler Inhale 2 puffs into the lungs every 6 (six) hours as needed for wheezing or shortness of breath. 1 Inhaler 0   . benztropine (COGENTIN) 0.5 MG tablet Take 1 tablet (0.5 mg total) by mouth 2 (two)  times daily. (Patient taking differently: Take 0.5 mg by mouth daily. ) 60 tablet 2   . budesonide-formoterol (SYMBICORT) 160-4.5 MCG/ACT inhaler Inhale 2 puffs into the lungs 2 (two) times daily. For Shortness of breath (Patient not taking: Reported on 08/24/2019) 1 Inhaler 0   . divalproex (DEPAKOTE) 500 MG DR tablet Take 500 mg by mouth 2 (two) times daily.     Lorayne Bender SUSTENNA 156 MG/ML SUSY injection Inject 156 mg as directed every 30 (thirty) days.     Marland Kitchen lidocaine (LIDODERM) 5 % Place 1 patch onto the skin daily. Remove & Discard patch within 12 hours or as directed by MD (Patient not taking: Reported on 08/24/2019) 12  patch 0   . traZODone (DESYREL) 100 MG tablet Take 1 tablet (100 mg total) by mouth at bedtime as needed for sleep. 30 tablet 0     Musculoskeletal: Strength & Muscle Tone: within normal limits Gait & Station: normal Patient leans: N/A  Psychiatric Specialty Exam: Physical Exam  Nursing note and vitals reviewed. Constitutional: She is oriented to person, place, and time. She appears well-developed and well-nourished.  HENT:  Head: Normocephalic and atraumatic.  Respiratory: Effort normal.  Neurological: She is alert and oriented to person, place, and time.    Review of Systems  Blood pressure (!) 118/96, pulse (!) 109, temperature 98.1 F (36.7 C), temperature source Oral, resp. rate 20, height 5\' 6"  (1.676 m), weight 85.3 kg, SpO2 100 %.Body mass index is 30.34 kg/m.  General Appearance: Disheveled  Eye Contact:  Fair  Speech:  Normal Rate  Volume:  Decreased  Mood:  Dysphoric  Affect:  Congruent  Thought Process:  Coherent and Descriptions of Associations: Circumstantial  Orientation:  Full (Time, Place, and Person)  Thought Content:  Delusions and Hallucinations: Auditory  Suicidal Thoughts:  No  Homicidal Thoughts:  No  Memory:  Immediate;   Poor Recent;   Poor Remote;   Poor  Judgement:  Impaired  Insight:  Lacking  Psychomotor Activity:  Normal   Concentration:  Concentration: Fair and Attention Span: Poor  Recall:  Poor  Fund of Knowledge:  Fair  Language:  Fair  Akathisia:  Negative  Handed:  Right  AIMS (if indicated):     Assets:  Desire for Improvement Resilience  ADL's:  Intact  Cognition:  WNL  Sleep:  Number of Hours: 6.75    Treatment Plan Summary: Daily contact with patient to assess and evaluate symptoms and progress in treatment, Medication management and Plan : Patient is seen and examined.  Patient is a 49 year old female with the above-stated past medical and psychiatric history who was admitted secondary to disorganization and psychotic symptoms.  She will be admitted to the hospital.  She will be integrated in the milieu.  She will be encouraged to attend groups.  I am going to restart her Depakote DR at 500 mg p.o. twice daily.  Given that she most recently has had the long-acting paliperidone injection per the notes I will go on and schedule this for tomorrow, but we will have pharmacy attempt to find out her medications.  I will go on and place her on oral paliperidone for now.  Hopefully we can make contact with her [name] team and find out an accurate list of her medicines.  Her inhalers will be continued for her asthma symptoms as well.  Observation Level/Precautions:  15 minute checks  Laboratory:  Chemistry Profile  Psychotherapy:    Medications:    Consultations:    Discharge Concerns:    Estimated LOS:  Other:     Physician Treatment Plan for Primary Diagnosis: <principal problem not specified> Long Term Goal(s): Improvement in symptoms so as ready for discharge  Short Term Goals: Ability to identify changes in lifestyle to reduce recurrence of condition will improve, Ability to verbalize feelings will improve, Ability to disclose and discuss suicidal ideas, Ability to demonstrate self-control will improve, Ability to identify and develop effective coping behaviors will improve and Ability to maintain  clinical measurements within normal limits will improve  Physician Treatment Plan for Secondary Diagnosis: Active Problems:   Schizophrenia ([place])  Long Term Goal(s): Improvement in symptoms so as ready for discharge  Short Term Goals: Ability to identify changes in lifestyle to reduce recurrence of condition will improve, Ability to verbalize feelings will improve, Ability to disclose and discuss suicidal ideas, Ability to demonstrate self-control will improve, Ability to identify and develop effective coping behaviors will improve, Ability to maintain clinical measurements within normal limits will improve and Compliance with prescribed medications will improve  I certify that inpatient services furnished can reasonably be expected to improve the patient's condition.    Sharma Covert, MD 1/31/202112:12 PM

## 2019-09-28 NOTE — [facility] Group Notes (Signed)
Adult Psychoeducational Group Note  Date:  09/28/2019 Time:  12:00 PM  Group Topic/Focus:  Progressive Relaxation  Participation Level:  Active  Participation Quality:  Attentive  Affect:  Appropriate  Cognitive:  Disorganized  Insight: Lacking  Engagement in Group:  Engaged  Modes of Intervention:  Activity and Support  Additional Comments:  Pt stated that her only support system was "God who loves me and my faith. I don't have any other supports" . Pt is religiously focused and refers everything back to her religion and her belief system.  Bryson Dames A 09/28/2019, 12:00 PM

## 2019-09-28 NOTE — [facility] Group Notes (Signed)
Adult Psychoeducational Group Note  Date:  09/28/2019 Time:  3:45 PM  Group Topic/Focus:  Life Skills  Participation Level:  Active  Participation Quality:  Appropriate  Affect:  Flat  Cognitive:  Appropriate  Insight: Lacking  Engagement in Group:  Engaged  Modes of Intervention:  Discussion  Additional Comments:  Remains Religiously preoccupied.  Paulino Rily 09/28/2019, 3:45 PM

## 2019-09-28 NOTE — Progress Notes (Signed)
   09/28/19 2114  Psych Admission Type (Psych Patients Only)  Admission Status Voluntary  Psychosocial Assessment  Patient Complaints Anxiety  Eye Contact Fair  Facial Expression Anxious  Affect Appropriate to circumstance  Speech Logical/coherent  Interaction Assertive  Motor Activity Other (Comment);Slow  Appearance/Hygiene Disheveled;In scrubs  Behavior Characteristics Appropriate to situation  Mood Pleasant  Thought Process  Coherency WDL  Content WDL  Delusions None reported or observed  Perception Hallucinations  Hallucination Auditory  Judgment Impaired  Confusion None  Danger to Self  Current suicidal ideation? Denies  Danger to Others  Danger to Others None reported or observed
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
## 2019-09-28 NOTE — Progress Notes (Signed)
Adult Psychoeducational Group Note  Date:  09/28/2019 Time:  9:19 PM  Group Topic/Focus:  Wrap-Up Group:   The focus of this group is to help patients review their daily goal of treatment and discuss progress on daily workbooks.  Participation Level:  Active  Participation Quality:  Appropriate, Sharing and Supportive  Affect:  Appropriate and Excited  Cognitive:  Appropriate  Insight: Appropriate  Engagement in Group:  Developing/Improving  Modes of Intervention:  Discussion  Additional Comments:  Pt stated her goal for today was to focus on her treatment plan.  Pt stated she accomplished her goal today. Pt stated her relationship with her family has improved since she was admitted. Pt stated been able to contact father today improved her day. Pt rated her overall day a 10. Pt stated her appetite was pretty good today. Pt stated her sleep last night was fair. Pt nurse was made aware of the situation. Pt stated she was in physical pain today. Pt stated she had pain in both legs today. Pt nurse was made aware of the situation. Pt deny auditory or visual hallucinations. Pt denies thoughts of harming herself or others. Pt stated she would alert staff if anything changes.  Candy Sledge 09/28/2019, 9:19 PM

## 2019-09-28 NOTE — Progress Notes (Signed)
Pt presents with disorganized thoughts, flight of ideas and loose association when conveying thoughts. Pt noted to be talking to herself while in her room. Pt denies active AVH. Pt denies SI/HI. Pt compliant with taking meds and denies any side effects.      09/28/19 0805  Psych Admission Type (Psych Patients Only)  Admission Status Voluntary  Psychosocial Assessment  Patient Complaints Anxiety  Eye Contact Fair  Facial Expression Anxious  Affect Anxious;Appropriate to circumstance;Preoccupied  Speech Logical/coherent  Interaction Assertive  Motor Activity Slow;Unsteady  Appearance/Hygiene Disheveled;In scrubs  Behavior Characteristics Cooperative;Appropriate to situation;Anxious  Mood Anxious;Preoccupied;Pleasant  Aggressive Behavior  Effect No apparent injury  Thought Process  Coherency WDL  Content WDL  Delusions None reported or observed  Perception Hallucinations  Hallucination Auditory  Judgment Impaired  Confusion None  Danger to Self  Current suicidal ideation? Denies  Danger to Others  Danger to Others None reported or observed

## 2019-09-28 NOTE — BHH Group Notes (Signed)
[facility] LCSW Group Therapy Note  Date/Time:  09/28/2019  11:00AM-12:00PM  Type of Therapy and Topic:  Group Therapy:  Music and Mood  Participation Level:  Active   Description of Group: In this process group, members listened to a variety of genres of music and identified that different types of music evoke different responses.  Patients were encouraged to identify music that was soothing for them and music that was energizing for them.  Patients discussed how this knowledge can help with wellness and recovery in various ways including managing depression and anxiety as well as encouraging healthy sleep habits.    Therapeutic Goals: Patients will explore the impact of different varieties of music on mood Patients will verbalize the thoughts they have when listening to different types of music Patients will identify music that is soothing to them as well as music that is energizing to them Patients will discuss how to use this knowledge to assist in maintaining wellness and recovery Patients will explore the use of music as a coping skill  Summary of Patient Progress:  At the beginning of group, patient expressed that she felt "great despite obstacles."  She was very active and spoke a lot throughout group, but she did not become monopolizing.  At the end of group, patient expressed that she felt the music helped her "to cope with the obstacles."    Therapeutic Modalities: Solution Focused Brief Therapy Activity   Selmer Dominion, LCSW

## 2019-09-28 NOTE — Progress Notes (Signed)
   09/28/19 2111  COVID-19 Daily Checkoff  Have you had a fever (temp > 37.80C/100F)  in the past 24 hours?  No  If you have had runny nose, nasal congestion, sneezing in the past 24 hours, has it worsened? No  COVID-19 EXPOSURE  Have you traveled outside the state in the past 14 days? No  Have you been in contact with someone with a confirmed diagnosis of COVID-19 or PUI in the past 14 days without wearing appropriate PPE? No  Have you been living in the same home as a person with confirmed diagnosis of COVID-19 or a PUI (household contact)? No  Have you been diagnosed with COVID-19? No

## 2019-09-29 DIAGNOSIS — F200 Paranoid schizophrenia: Secondary | ICD-10-CM

## 2019-09-29 LAB — PROLACTIN: Prolactin: 74.6 ng/mL — ABNORMAL HIGH (ref 4.8–23.3)

## 2019-09-29 NOTE — BHH Group Notes (Signed)
[facility] LCSW Group Therapy Note  Date/Time: 09/29/2019 @ 1:30pm  Type of Therapy and Topic:  Group Therapy:  Overcoming Obstacles  Participation Level:  BHH PARTICIPATION LEVEL: Did Not Attend  Description of Group:    In this group patients will be encouraged to explore what they see as obstacles to their own wellness and recovery. They will be guided to discuss their thoughts, feelings, and behaviors related to these obstacles. The group will process together ways to cope with barriers, with attention given to specific choices patients can make. Each patient will be challenged to identify changes they are motivated to make in order to overcome their obstacles. This group will be process-oriented, with patients participating in exploration of their own experiences as well as giving and receiving support and challenge from other group members.  Therapeutic Goals: 1. Patient will identify personal and current obstacles as they relate to admission. 2. Patient will identify barriers that currently interfere with their wellness or overcoming obstacles.  3. Patient will identify feelings, thought process and behaviors related to these barriers. 4. Patient will identify two changes they are willing to make to overcome these obstacles:    Summary of Patient Progress   Patient did not attend group therapy today. Patient came in the last 2 minutes of group and just obsessed.    Therapeutic Modalities:   Cognitive Behavioral Therapy Solution Focused Therapy Motivational Interviewing Relapse Prevention Therapy   Ardelle Anton, LCSW

## 2019-09-29 NOTE — Tx Team (Signed)
Interdisciplinary Treatment and Diagnostic Plan Update  09/29/2019 Time of Session: 10:05am [name] MRN: [id number]  Principal Diagnosis: <principal problem not specified>  Secondary Diagnoses: Active Problems:   Schizophrenia ([place])   Current Medications:  Current Facility-Administered Medications  Medication Dose Route Frequency Provider Last Rate Last Admin  . acetaminophen (TYLENOL) tablet 650 mg  650 mg Oral Q6H PRN Derrill Center, NP   650 mg at 09/27/19 2034  . alum & mag hydroxide-simeth (MAALOX/MYLANTA) 200-200-20 MG/5ML suspension 30 mL  30 mL Oral Q4H PRN Derrill Center, NP      . benztropine (COGENTIN) tablet 0.5 mg  0.5 mg Oral QHS Derrill Center, NP   0.5 mg at 09/28/19 2019  . divalproex (DEPAKOTE) DR tablet 500 mg  500 mg Oral Q12H Sharma Covert, MD   500 mg at 09/29/19 0817  . fluticasone (FLOVENT HFA) 44 MCG/ACT inhaler 2 puff  2 puff Inhalation BID Derrill Center, NP   2 puff at 09/29/19 0817  . hydrOXYzine (ATARAX/VISTARIL) tablet 25 mg  25 mg Oral TID PRN Derrill Center, NP      . magnesium hydroxide (MILK OF MAGNESIA) suspension 30 mL  30 mL Oral Daily PRN Derrill Center, NP      . paliperidone (INVEGA) 24 hr tablet 6 mg  6 mg Oral QHS Sharma Covert, MD   6 mg at 09/28/19 2019  . traZODone (DESYREL) tablet 50 mg  50 mg Oral QHS PRN Derrill Center, NP       PTA Medications: Medications Prior to Admission  Medication Sig Dispense Refill Last Dose  . albuterol (PROVENTIL HFA;VENTOLIN HFA) 108 (90 Base) MCG/ACT inhaler Inhale 2 puffs into the lungs every 6 (six) hours as needed for wheezing or shortness of breath. 1 Inhaler 0   . benztropine (COGENTIN) 0.5 MG tablet Take 1 tablet (0.5 mg total) by mouth 2 (two) times daily. (Patient taking differently: Take 0.5 mg by mouth daily. ) 60 tablet 2   . budesonide-formoterol (SYMBICORT) 160-4.5 MCG/ACT inhaler Inhale 2 puffs into the lungs 2 (two) times daily. For Shortness of breath (Patient not taking:  Reported on 08/24/2019) 1 Inhaler 0   . divalproex (DEPAKOTE) 500 MG DR tablet Take 500 mg by mouth 2 (two) times daily.     Lorayne Bender SUSTENNA 156 MG/ML SUSY injection Inject 156 mg as directed every 30 (thirty) days.   09/08/2019  . lidocaine (LIDODERM) 5 % Place 1 patch onto the skin daily. Remove & Discard patch within 12 hours or as directed by MD (Patient not taking: Reported on 08/24/2019) 12 patch 0   . traZODone (DESYREL) 100 MG tablet Take 1 tablet (100 mg total) by mouth at bedtime as needed for sleep. 30 tablet 0     Patient Stressors:    Patient Strengths:    Treatment Modalities: Medication Management, Group therapy, Case management,  1 to 1 session with clinician, Psychoeducation, Recreational therapy.   Physician Treatment Plan for Primary Diagnosis: <principal problem not specified> Long Term Goal(s): Improvement in symptoms so as ready for discharge Improvement in symptoms so as ready for discharge   Short Term Goals: Ability to identify changes in lifestyle to reduce recurrence of condition will improve Ability to verbalize feelings will improve Ability to disclose and discuss suicidal ideas Ability to demonstrate self-control will improve Ability to identify and develop effective coping behaviors will improve Ability to maintain clinical measurements within normal limits will improve Ability to identify changes  in lifestyle to reduce recurrence of condition will improve Ability to verbalize feelings will improve Ability to disclose and discuss suicidal ideas Ability to demonstrate self-control will improve Ability to identify and develop effective coping behaviors will improve Ability to maintain clinical measurements within normal limits will improve Compliance with prescribed medications will improve  Medication Management: Evaluate patient's response, side effects, and tolerance of medication regimen.  Therapeutic Interventions: 1 to 1 sessions, Unit Group  sessions and Medication administration.  Evaluation of Outcomes: Progressing  Physician Treatment Plan for Secondary Diagnosis: Active Problems:   Schizophrenia ([place])  Long Term Goal(s): Improvement in symptoms so as ready for discharge Improvement in symptoms so as ready for discharge   Short Term Goals: Ability to identify changes in lifestyle to reduce recurrence of condition will improve Ability to verbalize feelings will improve Ability to disclose and discuss suicidal ideas Ability to demonstrate self-control will improve Ability to identify and develop effective coping behaviors will improve Ability to maintain clinical measurements within normal limits will improve Ability to identify changes in lifestyle to reduce recurrence of condition will improve Ability to verbalize feelings will improve Ability to disclose and discuss suicidal ideas Ability to demonstrate self-control will improve Ability to identify and develop effective coping behaviors will improve Ability to maintain clinical measurements within normal limits will improve Compliance with prescribed medications will improve     Medication Management: Evaluate patient's response, side effects, and tolerance of medication regimen.  Therapeutic Interventions: 1 to 1 sessions, Unit Group sessions and Medication administration.  Evaluation of Outcomes: Progressing   RN Treatment Plan for Primary Diagnosis: <principal problem not specified> Long Term Goal(s): Knowledge of disease and therapeutic regimen to maintain health will improve  Short Term Goals: Ability to participate in decision making will improve, Ability to verbalize feelings will improve, Ability to disclose and discuss suicidal ideas, Ability to identify and develop effective coping behaviors will improve and Compliance with prescribed medications will improve  Medication Management: RN will administer medications as ordered by provider, will assess and  evaluate patient's response and provide education to patient for prescribed medication. RN will report any adverse and/or side effects to prescribing provider.  Therapeutic Interventions: 1 on 1 counseling sessions, Psychoeducation, Medication administration, Evaluate responses to treatment, Monitor vital signs and CBGs as ordered, Perform/monitor CIWA, COWS, AIMS and Fall Risk screenings as ordered, Perform wound care treatments as ordered.  Evaluation of Outcomes: Progressing   LCSW Treatment Plan for Primary Diagnosis: <principal problem not specified> Long Term Goal(s): Safe transition to appropriate next level of care at discharge, Engage patient in therapeutic group addressing interpersonal concerns.  Short Term Goals: Engage patient in aftercare planning with referrals and resources and Increase skills for wellness and recovery  Therapeutic Interventions: Assess for all discharge needs, 1 to 1 time with Social worker, Explore available resources and support systems, Assess for adequacy in community support network, Educate family and significant other(s) on suicide prevention, Complete Psychosocial Assessment, Interpersonal group therapy.  Evaluation of Outcomes: Progressing   Progress in Treatment: Attending groups: Yes. Participating in groups: Yes. Taking medication as prescribed: Yes. Toleration medication: Yes. Family/Significant other contact made: No, will contact:  pt's mother Patient understands diagnosis: No. Discussing patient identified problems/goals with staff: Yes. Medical problems stabilized or resolved: Yes. Denies suicidal/homicidal ideation: Yes. Issues/concerns per patient self-inventory: No. Other:   New problem(s) identified: No, Describe:  None  New Short Term/Long Term Goal(s): Medication stabilization, elimination of SI thoughts, and development of a comprehensive mental  wellness plan.   Patient Goals:  "listen to the doctor, take my medications, go  to group, and do better"   Discharge Plan or Barriers: CSW will continue to follow up for appropriate referrals and possible discharge planning  Reason for Continuation of Hospitalization: Anxiety Delusions  Depression Medication stabilization  Estimated Length of Stay: 3-5 days   Attendees: Patient: [name] 09/29/2019   Physician: Dr. [name], MD  09/29/2019   Nursing:  09/29/2019   RN Care Manager: 09/29/2019   Social Worker: Ardelle Anton, LCSW  09/29/2019   Recreational Therapist:  09/29/2019  Other:  09/29/2019   Other:  09/29/2019   Other: 09/29/2019     Scribe for Treatment Team: Trecia Rogers, LCSW 09/29/2019 11:10 AM

## 2019-09-29 NOTE — Progress Notes (Signed)
Adult Psychoeducational Group Note  Date:  09/29/2019 Time:  11:17 PM  Group Topic/Focus:  Wrap-Up Group:   The focus of this group is to help patients review their daily goal of treatment and discuss progress on daily workbooks.  Participation Level:  Did Not Attend  Participation Quality:  Did Not Attend  Affect:  Did Not Attend  Cognitive:  Did Not Attend  Insight: None  Engagement in Group:  Did Not Attend  Modes of Intervention:  Did Not Attend  Additional Comments:  Pt did not attend evening wrap up group tonight.  Candy Sledge 09/29/2019, 11:17 PM

## 2019-09-29 NOTE — Progress Notes (Signed)
Recreation Therapy Notes  Date: 2.1.21 Time: 1000 Location: 500 Hall Dayroom  Group Topic: Coping Skills  Goal Area(s) Addresses:  Patients will identify positive coping skills. Patients will identify benefit of using coping skills post d/c.  Behavioral Response: Engaged  Intervention: Worksheet, pencils  Activity: Mind Map.  LRT and patients filled in the first 8 boxes on the mind map together with instances (anger, survival, work, anxiety, school, emotional health, authority and finances where coping skills can be used.  Individually, patients then identified at least 3 coping skills that could be used for each.  LRT will write patient responses on the board.  Education: Radiographer, therapeutic, Dentist.   Education Outcome: Acknowledges understanding/In group clarification offered/Needs additional education.   Clinical Observations/Feedback: Pt came in late to group but joined right in with the activity.  Pt identified coping skills as using resources, having the will to maintain, utilize transferable skills, remove self from situations, count, be responsible, face problems head on, talk to someone you trust, don't self isolate, positive self talk, don't misuse authority, don't misuse people, do your do diligence, scale back, save and limit borrowing.    Victorino Sparrow, LRT/CTRS    Victorino Sparrow A 09/29/2019 12:57 PM

## 2019-09-29 NOTE — Progress Notes (Addendum)
[facility] MD Progress Note  09/29/2019 9:47 AM [name]  MRN:  [id number] Subjective:    Patient is a 49 year old female with a past psychiatric history significant for schizoaffective disorder; bipolar type who presented directly to the behavioral health hospital on 09/27/2019 and what was considered a manic state with disorganized thoughts. She was hyperverbal and rambling at that time. The patient admitted also at that time that she had been having auditory hallucinations. She has been treated recently by [name], but does not appear to have been seen recently. She is supposed to be taking several medications including Depakote, but her Depakote level on admission was essentially 0.  Patient presents as behaviorally contained and cognitively answering questions generally appropriately but endorsing continued delusional believes however ill-defined, "police activity- stuff in the neighborhood--" denying positive symptoms such as hallucinations otherwise, no EPS or TD.  Acknowledging poor compliance, has received paliperidone long-acting and oral paliperidone here   Principal Problem: Exacerbation of psychotic disorder Diagnosis: Active Problems:   Schizophrenia ([place])  Total Time spent with patient: 20 minutes  Past Psychiatric History: Extensive  Past Medical History:  Past Medical History:  Diagnosis Date  . Anemia   . Anxiety   . Asthma   . Bipolar 1 disorder ([place])   . Breast cancer ([place])   . Depression   . Diabetes mellitus without complication ([place])   . Hypertension   . Insomnia, persistent   . Schizophrenic disorder ([place])   . Seizures ([place])     Past Surgical History:  Procedure Laterality Date  . BREAST SURGERY Left   . TONSILLECTOMY     Family History:  Family History  Problem Relation Age of Onset  . Depression Mother   . Gout Mother   . Cancer Father        prostate  . Other Father        lung issue  . Alcoholism Other   . Heart attack  Paternal Grandfather   . Heart attack Paternal Grandmother   . Heart attack Maternal Grandmother   . Heart attack Maternal Grandfather   . Depression Son   . Anxiety disorder Son    Family Psychiatric  History: No new data Social History:  Social History   Substance and Sexual Activity  Alcohol Use Not Currently     Social History   Substance and Sexual Activity  Drug Use No    Social History   Socioeconomic History  . Marital status: Single    Spouse name: Not on file  . Number of children: Not on file  . Years of education: Not on file  . Highest education level: Not on file  Occupational History  . Not on file  Tobacco Use  . Smoking status: Former Smoker    Types: Cigarettes  . Smokeless tobacco: Never Used  Substance and Sexual Activity  . Alcohol use: Not Currently  . Drug use: No  . Sexual activity: Not Currently  Other Topics Concern  . Not on file  Social History Narrative  . Not on file   Social Determinants of Health   Financial Resource Strain:   . Difficulty of Paying Living Expenses: Not on file  Food Insecurity:   . Worried About Charity fundraiser in the Last Year: Not on file  . Ran Out of Food in the Last Year: Not on file  Transportation Needs:   . Lack of Transportation (Medical): Not on file  . Lack of Transportation (Non-Medical):  Not on file  Physical Activity:   . Days of Exercise per Week: Not on file  . Minutes of Exercise per Session: Not on file  Stress:   . Feeling of Stress : Not on file  Social Connections:   . Frequency of Communication with Friends and Family: Not on file  . Frequency of Social Gatherings with Friends and Family: Not on file  . Attends Religious Services: Not on file  . Active Member of Clubs or Organizations: Not on file  . Attends Archivist Meetings: Not on file  . Marital Status: Not on file   Additional Social History:    Pain Medications: Please see MAR Prescriptions: Please see  MAR Over the Counter: Please see MAR History of alcohol / drug use?: No history of alcohol / drug abuse(denies any current usage) Longest period of sobriety (when/how long): UTA                    Sleep: Fair  Appetite:  Fair  Current Medications: Current Facility-Administered Medications  Medication Dose Route Frequency Provider Last Rate Last Admin  . acetaminophen (TYLENOL) tablet 650 mg  650 mg Oral Q6H PRN D[place]ill [place], NP   650 mg at 09/27/19 2034  . alum & mag hydroxide-simeth (MAALOX/MYLANTA) 200-200-20 MG/5ML suspension 30 mL  30 mL Oral Q4H PRN Derrill Center, NP      . benztropine (COGENTIN) tablet 0.5 mg  0.5 mg Oral QHS Derrill Center, NP   0.5 mg at 09/28/19 2019  . divalproex (DEPAKOTE) DR tablet 500 mg  500 mg Oral Q12H Sharma Covert, MD   500 mg at 09/29/19 0817  . fluticasone (FLOVENT HFA) 44 MCG/ACT inhaler 2 puff  2 puff Inhalation BID Derrill Center, NP   2 puff at 09/29/19 0817  . hydrOXYzine (ATARAX/VISTARIL) tablet 25 mg  25 mg Oral TID PRN Derrill Center, NP      . magnesium hydroxide (MILK OF MAGNESIA) suspension 30 mL  30 mL Oral Daily PRN Derrill Center, NP      . paliperidone (INVEGA SUSTENNA) injection 234 mg  234 mg Intramuscular Once Sharma Covert, MD      . paliperidone (INVEGA) 24 hr tablet 6 mg  6 mg Oral QHS Sharma Covert, MD   6 mg at 09/28/19 2019  . traZODone (DESYREL) tablet 50 mg  50 mg Oral QHS PRN Derrill Center, NP        Lab Results:  Results for orders placed or performed during the hospital encounter of 09/27/19 (from the past 48 hour(s))  Respiratory Panel by RT PCR (Flu A&B, Covid) - Nasopharyngeal Swab     Status: None   Collection Time: 09/27/19  2:27 PM   Specimen: Nasopharyngeal Swab  Result Value Ref Range   SARS Coronavirus 2 by RT PCR NEGATIVE NEGATIVE    Comment: (NOTE) SARS-CoV-2 target nucleic acids are NOT DETECTED. The SARS-CoV-2 RNA is generally detectable in upper respiratoy specimens  during the acute phase of infection. The lowest concentration of SARS-CoV-2 viral copies this assay can detect is 131 copies/mL. A negative result does not preclude SARS-Cov-2 infection and should not be used as the sole basis for treatment or other patient management decisions. A negative result may occur with  improper specimen collection/handling, submission of specimen other than nasopharyngeal swab, presence of viral mutation(s) within the areas targeted by this assay, and inadequate number of viral copies (<131 copies/mL). A  negative result must be combined with clinical observations, patient history, and epidemiological information. The expected result is Negative. Fact Sheet for Patients:  PinkCheek.be Fact Sheet for Healthcare Providers:  GravelBags.it This test is not yet ap proved or cleared by the [place] FDA and  has been authorized for detection and/or diagnosis of SARS-CoV-2 by FDA under an Emergency Use Authorization (EUA). This EUA will remain  in effect (meaning this test can be used) for the duration of the COVID-19 declaration under Section 564(b)(1) of the Act, 21 U.S.C. section 360bbb-3(b)(1), unless the authorization is terminated or revoked sooner.    Influenza A by PCR NEGATIVE NEGATIVE   Influenza B by PCR NEGATIVE NEGATIVE    Comment: (NOTE) The Xpert Xpress SARS-CoV-2/FLU/RSV assay is intended as an aid in  the diagnosis of influenza from Nasopharyngeal swab specimens and  should not be used as a sole basis for treatment. Nasal washings and  aspirates are unacceptable for Xpert Xpress SARS-CoV-2/FLU/RSV  testing. Fact Sheet for Patients: PinkCheek.be Fact Sheet for Healthcare Providers: GravelBags.it This test is not yet approved or cleared by the [place] FDA and  has been authorized for detection and/or diagnosis of SARS-CoV-2  by  FDA under an Emergency Use Authorization (EUA). This EUA will remain  in effect (meaning this test can be used) for the duration of the  Covid-19 declaration under Section 564(b)(1) of the Act, 21  U.S.C. section 360bbb-3(b)(1), unless the authorization is  terminated or revoked. Performed at [facility], [place] [address], [place], [place] [address]   Valproic acid level     Status: Abnormal   Collection Time: 09/27/19  2:29 PM  Result Value Ref Range   Valproic Acid Lvl <10 (L) 50.0 - 100.0 ug/mL    Comment: RESULTS CONFIRMED BY MANUAL DILUTION Performed at [facility] [address] [place], [place] [address]   CBC     Status: None   Collection Time: 09/28/19  6:46 AM  Result Value Ref Range   WBC 4.2 4.0 - 10.5 K/uL   RBC 4.09 3.87 - 5.11 MIL/uL   Hemoglobin 12.4 12.0 - 15.0 g/dL   HCT 38.6 36.0 - 46.0 %   MCV 94.4 80.0 - 100.0 fL   MCH 30.3 26.0 - 34.0 pg   MCHC 32.1 30.0 - 36.0 g/dL   RDW 12.4 11.5 - 15.5 %   Platelets 237 150 - 400 K/uL   nRBC 0.0 0.0 - 0.2 %    Comment: Performed at [facility], [place] [address], [place], [place] [address]  Comprehensive metabolic panel     Status: Abnormal   Collection Time: 09/28/19  6:46 AM  Result Value Ref Range   Sodium 139 135 - 145 mmol/L   Potassium 4.0 3.5 - 5.1 mmol/L   Chloride 104 98 - 111 mmol/L   CO2 26 22 - 32 mmol/L   Glucose, Bld 115 (H) 70 - 99 mg/dL   BUN 13 6 - 20 mg/dL   Creatinine, Ser 0.72 0.44 - 1.00 mg/dL   Calcium 9.4 8.9 - 10.3 mg/dL   Total Protein 7.6 6.5 - 8.1 g/dL   Albumin 4.0 3.5 - 5.0 g/dL   AST 13 (L) 15 - 41 U/L   ALT 12 0 - 44 U/L   Alkaline Phosphatase 57 38 - 126 U/L   Total Bilirubin 0.3 0.3 - 1.2 mg/dL   GFR calc non Af Amer >60 >60 mL/min   GFR calc Af  Amer >60 >60 mL/min   Anion gap 9 5 - 15    Comment: Performed at [facility], [place] [address] [place], [place] [address]  Lipid panel     Status:  Abnormal   Collection Time: 09/28/19  6:46 AM  Result Value Ref Range   Cholesterol 186 0 - 200 mg/dL   Triglycerides 56 <150 mg/dL   HDL 52 >40 mg/dL   Total CHOL/HDL Ratio 3.6 RATIO   VLDL 11 0 - 40 mg/dL   LDL Cholesterol 123 (H) 0 - 99 mg/dL    Comment:        Total Cholesterol/HDL:CHD Risk Coronary Heart Disease Risk Table                     Men   Women  1/2 Average Risk   3.4   3.3  Average Risk       5.0   4.4  2 X Average Risk   9.6   7.1  3 X Average Risk  23.4   11.0        Use the calculated Patient Ratio above and the CHD Risk Table to determine the patient's CHD Risk.        ATP III CLASSIFICATION (LDL):  <100     mg/dL   Optimal  100-129  mg/dL   Near or Above                    Optimal  130-159  mg/dL   Borderline  160-189  mg/dL   High  >190     mg/dL   Very High Performed at [place] [address] [place], [place] [address]   TSH     Status: None   Collection Time: 09/28/19  6:46 AM  Result Value Ref Range   TSH 0.707 0.350 - 4.500 uIU/mL    Comment: Performed by a 3rd Generation assay with a functional sensitivity of <=0.01 uIU/mL. Performed at [facility], [place] [address], [place], [place] [address]   Prolactin     Status: Abnormal   Collection Time: 09/28/19  6:46 AM  Result Value Ref Range   Prolactin 74.6 (H) 4.8 - 23.3 ng/mL    Comment: (NOTE) Performed At: [facility] [place], [place] [id number] Rush Farmer MD [id number]     Blood Alcohol level:  Lab Results  Component Value Date   [facility] <10 05/02/2019   ETH <10 [id number]    Metabolic Disorder Labs: Lab Results  Component Value Date   HGBA1C 5.0 08/27/2018   MPG 96.8 08/27/2018   MPG 96.8 11/21/2017   Lab Results  Component Value Date   PROLACTIN 74.6 (H) 09/28/2019   PROLACTIN 4.2 (L) 12/19/2018   Lab Results  Component Value Date   CHOL 186 09/28/2019   TRIG 56 09/28/2019   HDL 52 09/28/2019    CHOLHDL 3.6 09/28/2019   VLDL 11 09/28/2019   LDLCALC 123 (H) 09/28/2019   LDLCALC 97 12/19/2018    Physical Findings: AIMS: Facial and Oral Movements Muscles of Facial Expression: None, normal Lips and Perioral Area: None, normal Jaw: None, normal Tongue: None, normal,Extremity Movements Upper (arms, wrists, hands, fingers): None, normal Lower (legs, knees, ankles, toes): None, normal, Trunk Movements Neck, shoulders, hips: None, normal, Overall Severity Severity of abnormal movements (highest score from questions above): None, normal Incapacitation due to abnormal movements: None, normal [name] awareness of abnormal movements (rate only  patient's report): No Awareness, Dental Status Current problems with teeth and/or dentures?: No Does patient usually wear dentures?: No  CIWA:    COWS:     Musculoskeletal: Strength & Muscle Tone: within normal limits Gait & Station: normal Patient leans: N/A  Psychiatric Specialty Exam: Physical Exam  Review of Systems  Blood pressure 129/90, pulse (!) 109, temperature 97.9 F (36.6 C), temperature source Oral, resp. rate 20, height 5\' 6"  (1.676 m), weight 85.3 kg, SpO2 100 %.Body mass index is 30.34 kg/m.  General Appearance: Disheveled  Eye Contact:  Minimal  Speech:  Clear and Coherent  Volume:  Decreased  Mood:  Euthymic  Affect:  Congruent  Thought Process:  Goal Directed and Descriptions of Associations: Circumstantial  Orientation:  Full (Time, Place, and Person)  Thought Content:  Delusions/mild paranoia/denies hallucinations  Suicidal Thoughts:  No  Homicidal Thoughts:  No  Memory:  Immediate;   Fair Recent;   Fair Remote;   Fair  Judgement:  Fair  Insight:  Fair  Psychomotor Activity:  Normal  Concentration:  Concentration: Fair and Attention Span: Fair  Recall:  [employer] of Knowledge:  Fair  Language:  Fair  Akathisia:  Negative  Handed:  Right  AIMS (if indicated):     Assets:  Physical Health Resilience   ADL's:  Intact  Cognition:  WNL  Sleep:  Number of Hours: 6.75     Treatment Plan Summary: Daily contact with patient to assess and evaluate symptoms and progress in treatment and Medication management  Continue reality based therapy Continue combination long-acting injectable and oral paliperidone Continue to monitor for safety No change in precautions  Tuff Clabo, MD 09/29/2019, 9:47 AM

## 2019-09-29 NOTE — Plan of Care (Signed)
Progress note  D: pt found in bed; compliant with medication administration. Pt states they slept well. Pt rates their depression/hopelessness/anxiety a 1/0/0 out of 10 respectively. Pt denies any physical complaints or pain. Pt states their goal for today is to listen to their doctor and will achieve this attending group meetings, taking medications, and keeping good hygiene. Pt received their Invega 234 today. Pt denies si/hi/ah/vh and verbally agrees to approach staff if these become apparent or before harming themself/others while at [place].  A: Pt provided support and encouragement. Pt given medication per protocol and standing orders. Q51m safety checks implemented and continued.  R: Pt safe on the unit. Will continue to monitor.  Pt progressing in the following metrics  Problem: Education: Goal: Knowledge of [facility] Education information/materials will improve Outcome: Progressing Goal: Emotional status will improve Outcome: Progressing Goal: Mental status will improve Outcome: Progressing Goal: Verbalization of understanding the information provided will improve Outcome: Progressing

## 2019-09-29 NOTE — Progress Notes (Signed)
Recreation Therapy Notes  Patient admitted to unit 1.30.21. Due to admission within last year, no new assessment conducted at this time. Last assessment conducted 4.24.20. Patient reports reason for admission was "self isolating" and patient stated "I wasn't doing well".  Patient expressed stressors were taking care of her teenage children.  Patient identified coping skills isolation, journal, sports, listening to others, tv, positive self talk, music, exercise, meditate, prayer and reading.  Patient stated leisure interests were going to church on zoom and watching Youtube videos.  Patient identified strengths as being friendly, hardworking and helpful.  Patient areas of improvement are articulation, being accurate and "not worrying and relax"  Patient denies SI, HI, AVH at this time. Patient reports goal of working on anxiety.    Victorino Sparrow, LRT/CTRS    Victorino Sparrow A 09/29/2019 2:52 PM

## 2019-09-30 NOTE — Progress Notes (Signed)
[facility] MD Progress Note  09/30/2019 8:20 AM [name]  MRN:  [id number] Subjective:   Patient is a 49 year old female with a past psychiatric history significant for schizoaffective disorder; bipolar type who presented directly to the behavioral health hospital on 09/27/2019 and what was considered a manic state with disorganized thoughts. She was hyperverbal and rambling at that time. The patient admitted also at that time that she had been having auditory hallucinations. She has been treated recently by the [facility] [name] service, but does not appear to have been seen recently. She is supposed to be taking several medications including Depakote, but her Depakote level on admission was essentially 0.  This is the third hospital day for [name] she is in bed during our rounds but has no involuntary movements.  She states "I was on Invega, Depakote, trazodone, benztropine, and they were trying to find the right amounts" she believes that her medications were not effective at their current doses, however she does acknowledge noncompliance.   She is certainly more organized than her initial presentation she rambles about family matters so her answers are often disjointed and not relevant to the question but they are coherent overall. She denies current auditory or visual hallucinations  Principal Problem: Noncompliance resulting in exacerbation of underlying psychotic disorder Diagnosis: Active Problems:   Schizophrenia ([place])  Total Time spent with patient: 20 minutes  Past Psychiatric History: Prior admissions and similar presentations  Past Medical History:  Past Medical History:  Diagnosis Date  . Anemia   . Anxiety   . Asthma   . Bipolar 1 disorder ([place])   . Breast cancer ([place])   . Depression   . Diabetes mellitus without complication ([place])   . Hypertension   . Insomnia, persistent   . Schizophrenic disorder ([place])   . Seizures ([place])     Past Surgical History:  Procedure  Laterality Date  . BREAST SURGERY Left   . TONSILLECTOMY     Family History:  Family History  Problem Relation Age of Onset  . Depression Mother   . Gout Mother   . Cancer Father        prostate  . Other Father        lung issue  . Alcoholism Other   . Heart attack Paternal Grandfather   . Heart attack Paternal Grandmother   . Heart attack Maternal Grandmother   . Heart attack Maternal Grandfather   . Depression Son   . Anxiety disorder Son    Family Psychiatric  History: No new data shared Social History:  Social History   Substance and Sexual Activity  Alcohol Use Not Currently     Social History   Substance and Sexual Activity  Drug Use No    Social History   Socioeconomic History  . Marital status: Single    Spouse name: Not on file  . Number of children: Not on file  . Years of education: Not on file  . Highest education level: Not on file  Occupational History  . Not on file  Tobacco Use  . Smoking status: Former Smoker    Types: Cigarettes  . Smokeless tobacco: Never Used  Substance and Sexual Activity  . Alcohol use: Not Currently  . Drug use: No  . Sexual activity: Not Currently  Other Topics Concern  . Not on file  Social History Narrative  . Not on file   Social Determinants of Health   Financial Resource Strain:   . Difficulty of  Paying Living Expenses: Not on file  Food Insecurity:   . Worried About Charity fundraiser in the Last Year: Not on file  . Ran Out of Food in the Last Year: Not on file  Transportation Needs:   . Lack of Transportation (Medical): Not on file  . Lack of Transportation (Non-Medical): Not on file  Physical Activity:   . Days of Exercise per Week: Not on file  . Minutes of Exercise per Session: Not on file  Stress:   . Feeling of Stress : Not on file  Social Connections:   . Frequency of Communication with Friends and Family: Not on file  . Frequency of Social Gatherings with Friends and Family: Not on file   . Attends Religious Services: Not on file  . Active Member of Clubs or Organizations: Not on file  . Attends Archivist Meetings: Not on file  . Marital Status: Not on file   Additional Social History:    Pain Medications: Please see MAR Prescriptions: Please see MAR Over the Counter: Please see MAR History of alcohol / drug use?: No history of alcohol / drug abuse(denies any current usage) Longest period of sobriety (when/how long): UTA                    Sleep: Good  Appetite:  Good  Current Medications: Current Facility-Administered Medications  Medication Dose Route Frequency Provider Last Rate Last Admin  . acetaminophen (TYLENOL) tablet 650 mg  650 mg Oral Q6H PRN D[place]ill [place], NP   650 mg at 09/27/19 2034  . alum & mag hydroxide-simeth (MAALOX/MYLANTA) 200-200-20 MG/5ML suspension 30 mL  30 mL Oral Q4H PRN Derrill Center, NP      . benztropine (COGENTIN) tablet 0.5 mg  0.5 mg Oral QHS Derrill Center, NP   0.5 mg at 09/29/19 2125  . divalproex (DEPAKOTE) DR tablet 500 mg  500 mg Oral Q12H Sharma Covert, MD   500 mg at 09/29/19 2125  . fluticasone (FLOVENT HFA) 44 MCG/ACT inhaler 2 puff  2 puff Inhalation BID Derrill Center, NP   2 puff at 09/29/19 2126  . hydrOXYzine (ATARAX/VISTARIL) tablet 25 mg  25 mg Oral TID PRN Derrill Center, NP   25 mg at 09/29/19 2125  . magnesium hydroxide (MILK OF MAGNESIA) suspension 30 mL  30 mL Oral Daily PRN Derrill Center, NP      . paliperidone (INVEGA) 24 hr tablet 6 mg  6 mg Oral QHS Sharma Covert, MD   6 mg at 09/29/19 2125  . traZODone (DESYREL) tablet 50 mg  50 mg Oral QHS PRN Derrill Center, NP        Lab Results: No results found for this or any previous visit (from the past 49 hour(s)).  Blood Alcohol level:  Lab Results  Component Value Date   ETH <10 05/02/2019   ETH <10 [id number]    Metabolic Disorder Labs: Lab Results  Component Value Date   HGBA1C 5.0 08/27/2018   MPG 96.8  08/27/2018   MPG 96.8 11/21/2017   Lab Results  Component Value Date   PROLACTIN 74.6 (H) 09/28/2019   PROLACTIN 4.2 (L) 12/19/2018   Lab Results  Component Value Date   CHOL 186 09/28/2019   TRIG 56 09/28/2019   HDL 52 09/28/2019   CHOLHDL 3.6 09/28/2019   VLDL 11 09/28/2019   LDLCALC 123 (H) 09/28/2019   LDLCALC 97 12/19/2018  Physical Findings: AIMS: Facial and Oral Movements Muscles of Facial Expression: None, normal Lips and Perioral Area: None, normal Jaw: None, normal Tongue: None, normal,Extremity Movements Upper (arms, wrists, hands, fingers): None, normal Lower (legs, knees, ankles, toes): None, normal, Trunk Movements Neck, shoulders, hips: None, normal, Overall Severity Severity of abnormal movements (highest score from questions above): None, normal Incapacitation due to abnormal movements: None, normal [name] awareness of abnormal movements (rate only patient's report): No Awareness, Dental Status Current problems with teeth and/or dentures?: No Does patient usually wear dentures?: No  CIWA:    COWS:     Musculoskeletal: Strength & Muscle Tone: within normal limits Gait & Station: normal Patient leans: N/A  Psychiatric Specialty Exam: Physical Exam  Review of Systems  Blood pressure 112/84, pulse 96, temperature 98.1 F (36.7 C), temperature source Oral, resp. rate 20, height 5\' 6"  (1.676 m), weight 85.3 kg, SpO2 100 %.Body mass index is 30.34 kg/m.  General Appearance: Guarded  Eye Contact:  Minimal  Speech:  Pressured  Volume:  Normal  Mood:  Euthymic and But at times noted to have intervals of hypomania  Affect:  Constricted  Thought Process:  Irrelevant and Descriptions of Associations: Circumstantial  Orientation:  Full (Time, Place, and Person)  Thought Content:  No delusional material discerned denies hallucinations  Suicidal Thoughts:  No  Homicidal Thoughts:  No  Memory:  Immediate;   Fair Recent;   Fair Remote;   Fair   Judgement:  Fair  Insight:  Fair  Psychomotor Activity:  Normal  Concentration:  Concentration: Fair and Attention Span: Fair  Recall:  [employer] of Knowledge:  Fair  Language:  Fair  Akathisia:  Negative  Handed:  Right  AIMS (if indicated):     Assets:  Leisure Time Physical Health  ADL's:  Intact  Cognition:  WNL  Sleep:  Number of Hours: 8.5     Treatment Plan Summary: Daily contact with patient to assess and evaluate symptoms and progress in treatment and Medication management  Will check Depakote level in the morning no change in long-acting injectable paliperidone or oral paliperidone continue current precautions and monitor on 15-minute checks Continue reality based therapy  Josephine Rudnick, MD 09/30/2019, 8:20 AM

## 2019-09-30 NOTE — Progress Notes (Signed)
   09/30/19 0000  Psych Admission Type (Psych Patients Only)  Admission Status Voluntary  Psychosocial Assessment  Patient Complaints Anxiety  Eye Contact Fair  Facial Expression Anxious;Pensive  Affect Anxious;Appropriate to circumstance;Preoccupied  Speech Logical/coherent  Interaction Assertive  Motor Activity Slow  Appearance/Hygiene Disheveled;In scrubs  Behavior Characteristics Cooperative  Mood Depressed  Thought Process  Coherency WDL  Content WDL  Delusions None reported or observed  Perception Hallucinations  Hallucination Auditory  Judgment Impaired  Confusion None  Danger to Self  Current suicidal ideation? Denies  Danger to Others  Danger to Others None reported or observed

## 2019-09-30 NOTE — [facility] Suicide Risk Assessment (Signed)
[facility] INPATIENT:  Family/Significant Other Suicide Prevention Education  Suicide Prevention Education:  Education Completed; Pt's mother, [name], has been identified by the patient as the family member/significant other with whom the patient will be residing, and identified as the person(s) who will aid the patient in the event of a mental health crisis (suicidal ideations/suicide attempt).  With written consent from the patient, the family member/significant other has been provided the following suicide prevention education, prior to the and/or following the discharge of the patient.  The suicide prevention education provided includes the following:  Suicide risk factors  Suicide prevention and interventions  National Suicide Hotline telephone number  [facility] assessment telephone number  [facility] Emergency Assistance [place] and/or Residential Mobile Crisis Unit telephone number  Request made of family/significant other to:  Remove weapons (e.g., guns, rifles, knives), all items previously/currently identified as safety concern.    Remove drugs/medications (over-the-counter, prescriptions, illicit drugs), all items previously/currently identified as a safety concern.  The family member/significant other verbalizes understanding of the suicide prevention education information provided.  The family member/significant other agrees to remove the items of safety concern listed above.   CSW contacted pt's mother, [name]. Pt's mother stated that this happens quite a bit with the patient but she does have her own place and the [name] team through [place] that sees her. Pt's mother states that she does not have any questions or concerns regarding the patient's care.   Trecia Rogers 09/30/2019, 1:50 PM

## 2019-09-30 NOTE — Progress Notes (Signed)
Recreation Therapy Notes    Date: 2.2.21 Time: 1000 Location: 500 Hall Dayroom  Group Topic: Anger Management  Goal Area(s) Addresses:  Patient will identify triggers for anger.  Patient will identify a situation that makes them angry.  Patient will identify what other emotions comes with anger.   Behavioral Response: Engaged  Intervention: Worksheet  Activity: Technical sales engineer.  Patients were to identify at least 6 things that get them angry.  Patients were to then rank those things on the thermometer from 1 (calm) to 10 (the angriest you can get). Patients then had to identify 5 positive coping skills to help deal with anger.  Education: Anger Management, Discharge Planning   Education Outcome: Acknowledges education/In group clarification offered/Needs additional education.   Clinical Observations/Feedback: Pt identified the things that get her angry as messing with her family (10), lying on her (9) and making fun of teachers (7).  Pt expressed coping skills for dealing with these things were compromise were able, bring someone in that can help and talk it out.    Victorino Sparrow, LRT/CTRS    Ria Comment, Domini Vandehei A 09/30/2019 10:50 AM

## 2019-09-30 NOTE — [facility] Group Notes (Signed)
Pt did not attend wrap up group this evening. Pt was in bed sleeping.  

## 2019-10-01 LAB — VALPROIC ACID LEVEL: Valproic Acid Lvl: 85 ug/mL (ref 50.0–100.0)

## 2019-10-01 MED ORDER — HYDROXYZINE HCL 25 MG PO TABS: 25.0000 mg | ORAL_TABLET | Freq: Three times a day (TID) | ORAL | 0 refills | Status: DC | PRN

## 2019-10-01 MED ORDER — TRAZODONE HCL 50 MG PO TABS: 50.0000 mg | ORAL_TABLET | Freq: Every evening | ORAL | 0 refills | Status: DC | PRN

## 2019-10-01 MED ORDER — PALIPERIDONE ER 6 MG PO TB24: 6.0000 mg | ORAL_TABLET | Freq: Every day | ORAL | 0 refills | Status: DC

## 2019-10-01 MED ORDER — INVEGA SUSTENNA 156 MG/ML IM SUSY: 156.0000 mg | PREFILLED_SYRINGE | INTRAMUSCULAR | 0 refills | Status: DC

## 2019-10-01 MED ORDER — FLUTICASONE PROPIONATE HFA 44 MCG/ACT IN AERO: 2.0000 | INHALATION_SPRAY | Freq: Two times a day (BID) | RESPIRATORY_TRACT | 12 refills | Status: DC

## 2019-10-01 MED ORDER — DIVALPROEX SODIUM 500 MG PO DR TAB: 500.0000 mg | DELAYED_RELEASE_TABLET | Freq: Two times a day (BID) | ORAL | 0 refills | Status: DC

## 2019-10-01 MED ORDER — BENZTROPINE MESYLATE 0.5 MG PO TABS: 0.5000 mg | ORAL_TABLET | Freq: Every day | ORAL | 0 refills | Status: DC

## 2019-10-01 NOTE — Discharge Summary (Signed)
Physician Discharge Summary Note  Patient:  [name] is an 49 y.o., female  MRN:  [id number]  DOB:  11-Feb-1971  Patient phone:  [phone] (home)   Patient address:   [address] [place] [address],   Total Time spent with patient: Greater than 30 minutes  Date of Admission:  09/27/2019   Date of Discharge: 10/01/2019  Reason for Admission: Patient presented on admission manic with disorganized thought process.  Principal Problem: Schizoaffective disorder, bipolar type [facility])  Discharge Diagnoses: Patient Active Problem List   Diagnosis Date Noted  . Schizoaffective disorder, bipolar type ([place]) [F25.0] 01/11/2015    Priority: High  . Schizophrenia ([place]) [F20.9] 10/04/2018  . Encounter for medical clearance for patient hold [Z00.8]   . Noncompliance with treatment [Z91.19] 07/16/2015   Past Psychiatric History: Schizoaffective disorder, Bipolar-type.  Past Medical History:  Past Medical History:  Diagnosis Date  . Anemia   . Anxiety   . Asthma   . Bipolar 1 disorder ([place])   . Breast cancer ([place])   . Depression   . Diabetes mellitus without complication ([place])   . Hypertension   . Insomnia, persistent   . Schizophrenic disorder ([place])   . Seizures ([place])     Past Surgical History:  Procedure Laterality Date  . BREAST SURGERY Left   . TONSILLECTOMY     Family History:  Family History  Problem Relation Age of Onset  . Depression Mother   . Gout Mother   . Cancer Father        prostate  . Other Father        lung issue  . Alcoholism Other   . Heart attack Paternal Grandfather   . Heart attack Paternal Grandmother   . Heart attack Maternal Grandmother   . Heart attack Maternal Grandfather   . Depression Son   . Anxiety disorder Son    Family Psychiatric  History: See H&P.  Social History:  Social History   Substance and Sexual Activity  Alcohol Use Not Currently     Social History   Substance and Sexual Activity  Drug Use No     Social History   Socioeconomic History  . Marital status: Single    Spouse name: Not on file  . Number of children: Not on file  . Years of education: Not on file  . Highest education level: Not on file  Occupational History  . Not on file  Tobacco Use  . Smoking status: Former Smoker    Types: Cigarettes  . Smokeless tobacco: Never Used  Substance and Sexual Activity  . Alcohol use: Not Currently  . Drug use: No  . Sexual activity: Not Currently  Other Topics Concern  . Not on file  Social History Narrative  . Not on file   Social Determinants of Health   Financial Resource Strain:   . Difficulty of Paying Living Expenses: Not on file  Food Insecurity:   . Worried About Charity fundraiser in the Last Year: Not on file  . Ran Out of Food in the Last Year: Not on file  Transportation Needs:   . Lack of Transportation (Medical): Not on file  . Lack of Transportation (Non-Medical): Not on file  Physical Activity:   . Days of Exercise per Week: Not on file  . Minutes of Exercise per Session: Not on file  Stress:   . Feeling of Stress : Not on file  Social Connections:   . Frequency  of Communication with Friends and Family: Not on file  . Frequency of Social Gatherings with Friends and Family: Not on file  . Attends Religious Services: Not on file  . Active Member of Clubs or Organizations: Not on file  . Attends Archivist Meetings: Not on file  . Marital Status: Not on file   Hospital Course: (Per Md's admission evaluation): Patient is a 49 year old female with a past psychiatric history significant for schizoaffective disorder; bipolar type who presented directly to the behavioral health hospital on 09/27/2019 and what was considered a manic state with disorganized thoughts. She was hyperverbal and rambling at that time. The patient admitted also at that time that she had been having auditory hallucinations. She has been treated recently by the [name]  [name] service, but does not appear to have been seen recently. She is supposed to be taking several medications including Depakote, but her Depakote level on admission was essentially 0. The patient did receive Risperdal while in observation last night, and seems calm her today. She remains somewhat disorganized, and very vague on her symptoms as well as her previous treatment. The last admission then I was able to find was in July 2020. She was hospitalized in [facility]. Her medications at this time consisted of buspirone, Depakote extended release, the Abilify long-acting injection as well as oral Abilify. She also has a history of asthma, and was taking medications for that as well. An emergency room visit on 08/24/2019 for an intractable headache showed her medications included trazodone, albuterol, lidocaine patch, Cogentin, Depakote DR, the long-acting paliperidone injection and oral Risperdal. Review of her admission laboratories revealed a mildly elevated glucose, but essentially normal electrolytes as well as CBC. Her lipids were normal. Her Depakote level was less than 10. TSH was 0.707. Drug screen is not been obtained as of yet. She was admitted to the hospital for evaluation and stabilization.  This is one of several psychiatric discharge summaries from this [facility] alone for this 49 year old AA female with hx of chronic mental illness & multiple psychiatric admissions. She is known in this [facility] & other psychiatric hospitals within the surrounding areas for worsening symptoms of psychosis, disorganized behavior & mania. She is known to be non-adherent to her treatment regimen. She has been tried on multiple psychotropic medications for her symptoms & it appears nothing has actually been helpful in stabilizing her symptoms. She was brought to the [facility] this time around for evaluation & treatment after her her symptoms worsened.  After evaluation of her presenting symptoms, [name] was  recommended for mood stabilization treatments. The medication regimen for her presenting symptoms were discussed & with her consent initiated. She received, stabilized & was discharged on the medications as listed below on her discharge medication lists. She was also enrolled & participated in the group counseling sessions being offered & held on this unit. She learned coping skills. She presented on this admission, other chronic medical conditions that required treatment & monitoring. She was resumed/discharged on all her pertinent home medications for those health issues. She tolerated her treatment regimen without any adverse effects or reactions reported.  And because of the chronic nature of her psychiatric symptoms & their resistance to the treatment regimen, [name] was treated, stabilized & being discharged on monthly Invega injectable & Invega tabs. This is because she has not been able to achieve symptoms control under an antipsychotic monotherapy formular. These 2 forms of the same antipsychotic therapy seem effective in stabilizing her  symptoms at this time. It will benefit patient to continue on this antipsychotic therapy as recommended to maintain maximum symptom. The Invega tablets may be discontinued after 2 weeks from this date of discharge.  During the course of her hospitalization, the 15-minute checks were adequate to ensure [name]'s safety.  Patient did not display any dangerous, violent or suicidal behavior on the unit.  She interacted with patients & staff appropriately, participated appropriately in the group sessions/therapies. Her medications were addressed & adjusted to meet her needs. She was recommended for outpatient follow-up care & medication management upon discharge to assure her continuity of care.  At the time of discharge patient is not reporting any acute suicidal/homicidal ideations. She feels more confident about her self-care & in managing the suicidal thoughts. She  currently denies any new issues or concerns. Education and supportive counseling provided throughout her hospital stay & upon discharge.  Today upon her discharge evaluation with the attending psychiatrist, [name] shares she is doing well. She denies any other specific concerns. She is sleeping well. Her appetite is good. She denies other physical complaints. She denies AH/VH. She feels that her medications have been helpful & is in agreement to continue her current treatment regimen as recommended. She was able to engage in safety planning including plan to return to [facility] or contact emergency services if she feels unable to maintain her own safety or the safety of others. Pt had no further questions, comments, or concerns. She left [facility] with all personal belongings in no apparent distress.   Physical Findings: AIMS: Facial and Oral Movements Muscles of Facial Expression: None, normal Lips and Perioral Area: None, normal Jaw: None, normal Tongue: None, normal,Extremity Movements Upper (arms, wrists, hands, fingers): None, normal Lower (legs, knees, ankles, toes): None, normal, Trunk Movements Neck, shoulders, hips: None, normal, Overall Severity Severity of abnormal movements (highest score from questions above): None, normal Incapacitation due to abnormal movements: None, normal [name] awareness of abnormal movements (rate only patient's report): No Awareness, Dental Status Current problems with teeth and/or dentures?: No Does patient usually wear dentures?: No  CIWA:    COWS:     Musculoskeletal: Strength & Muscle Tone: within normal limits Gait & Station: normal Patient leans: N/A  Psychiatric Specialty Exam: See SRA by MD  Physical Exam  Nursing note and vitals reviewed. Constitutional: She is oriented to person, place, and time. She appears well-developed.  HENT:  Head: Normocephalic.  Eyes: Pupils are equal, round, and reactive to light.  Cardiovascular: Normal rate.   Respiratory: Effort normal.  GI: Soft.  Genitourinary:    Genitourinary Comments: Deferred   Musculoskeletal:        General: Normal range of motion.     Cervical back: Normal range of motion.  Neurological: She is alert and oriented to person, place, and time.  Skin: Skin is warm.  Psychiatric: She has a normal mood and affect. Her behavior is normal.    Review of Systems  Constitutional: Negative.   HENT: Negative.   Eyes: Negative.   Respiratory: Negative.  Negative for cough and shortness of breath.   Cardiovascular: Negative.  Negative for chest pain and palpitations.  Gastrointestinal: Negative.  Negative for abdominal pain, heartburn, nausea and vomiting.  Genitourinary: Negative.   Musculoskeletal: Negative.   Skin: Negative.   Neurological: Negative.  Negative for dizziness and headaches.  Endo/Heme/Allergies: Negative.   Psychiatric/Behavioral: Positive for depression (Stabilized with medication prior to discharge) and hallucinations (Hx. psychosis (Stabilized with medications prior to discharge)  ).  Negative for memory loss (Improved), substance abuse and suicidal ideas. The patient has insomnia (Stabilized with medication prior to discharge). The patient is not nervous/anxious (Stable).   All other systems reviewed and are negative.   Blood pressure (!) 117/96, pulse 98, temperature 97.6 F (36.4 C), temperature source Oral, resp. rate 20, height 5\' 6"  (1.676 m), weight 85.3 kg, SpO2 100 %.Body mass index is 30.34 kg/m.      Has this patient used any form of tobacco in the last 30 days? (Cigarettes, Smokeless Tobacco, Cigars, and/or Pipes)  No  Blood Alcohol level:  Lab Results  Component Value Date   ETH <10 05/02/2019   ETH <10 [id number]   Metabolic Disorder Labs:  Lab Results  Component Value Date   HGBA1C 5.0 08/27/2018   MPG 96.8 08/27/2018   MPG 96.8 11/21/2017   Lab Results  Component Value Date   PROLACTIN 74.6 (H) 09/28/2019   PROLACTIN 4.2 (L)  12/19/2018   Lab Results  Component Value Date   CHOL 186 09/28/2019   TRIG 56 09/28/2019   HDL 52 09/28/2019   CHOLHDL 3.6 09/28/2019   VLDL 11 09/28/2019   LDLCALC 123 (H) 09/28/2019   LDLCALC 97 12/19/2018   See Psychiatric Specialty Exam and Suicide Risk Assessment completed by Attending Physician prior to discharge.  Discharge destination:  Home  Is patient on multiple antipsychotic therapies at discharge: No,    Do you recommend tapering to monotherapy for antipsychotics?  NA    Has Patient had three or more failed trials of antipsychotic monotherapy by history: Yes,   Antipsychotic medications that previously failed include:   1.  Haldol (due to allergic reaction), ., 2.  Abilify. and 3.  Risperdal.  Recommended Plan for Multiple Antipsychotic Therapies: NA.  Allergies as of 10/01/2019      Reactions   Haldol [haloperidol Decanoate] Other (See Comments)   Stiffness, eyes bulging   Haloperidol    Penicillins Nausea And Vomiting   Has patient had a PCN reaction causing immediate rash, facial/tongue/throat swelling, SOB or lightheadedness with hypotension:UNSURE  Has patient had a PCN reaction causing severe rash involving mucus membranes or skin necrosis: UNSURE Has patient had a PCN reaction that required hospitalization:UNSURE Has patient had a PCN reaction occurring within the last 10 years:No If all of the above answers are "NO", then may proceed with Cephalosporin use. CHILDHOOD REACTION   Pollen Extract Other (See Comments)   Seasonal allergies   Shrimp [shellfish Allergy] Rash      Medication List    STOP taking these medications   lidocaine 5 % Commonly known as: Lidoderm     TAKE these medications     Indication  albuterol 108 (90 Base) MCG/ACT inhaler Commonly known as: VENTOLIN HFA Inhale 2 puffs into the lungs every 6 (six) hours as needed for wheezing or shortness of breath.  Indication: Asthma   benztropine 0.5 MG tablet Commonly known as:  COGENTIN Take 1 tablet (0.5 mg total) by mouth at bedtime. For prevention of drug induced tremors What changed:   when to take this  additional instructions  Indication: Extrapyramidal Reaction caused by Medications   budesonide-formoterol 160-4.5 MCG/ACT inhaler Commonly known as: SYMBICORT Inhale 2 puffs into the lungs 2 (two) times daily. For Shortness of breath  Indication: Asthma   divalproex 500 MG DR tablet Commonly known as: DEPAKOTE Take 1 tablet (500 mg total) by mouth every 12 (twelve) hours. For mood stabilization What changed:   when to take  this  additional instructions  Indication: Mood stabilization   fluticasone 44 MCG/ACT inhaler Commonly known as: FLOVENT HFA Inhale 2 puffs into the lungs 2 (two) times daily. For SOB  Indication: Chronic Obstructive Lung Disease   hydrOXYzine 25 MG tablet Commonly known as: ATARAX/VISTARIL Take 1 tablet (25 mg total) by mouth 3 (three) times daily as needed for anxiety.  Indication: Feeling Anxious   Invega Sustenna 156 MG/ML Susy injection Generic drug: paliperidone Inject 1 mL (156 mg total) into the muscle every 30 (thirty) days. (Due on 10-28-19): For mood control Start taking on: October 28, 2019 What changed:   how to take this  additional instructions  Indication: Schizoaffective Disorder   paliperidone 6 MG 24 hr tablet Commonly known as: INVEGA Take 1 tablet (6 mg total) by mouth at bedtime. For mood control  Indication: Mood control   traZODone 50 MG tablet Commonly known as: DESYREL Take 1 tablet (50 mg total) by mouth at bedtime as needed for sleep. What changed:   medication strength  how much to take  Indication: Trouble Sleeping      Follow-up Information    [facility]. Call on 10/02/2019.   Why: [name] services will continue at discharge. Please be sure to call for any additional questions or concerns. Contact numbers for your [name] team are [phone] (Crisis Line) or [phone]  (Regular).  Contact information: [address] [place] [address] [phone]          Follow-up recommendations: Activity:  As tolerated Diet: As recommended by your primary care doctor. Keep all scheduled follow-up appointments as recommended.   Comments: Prescriptions given at discharge.  Patient agreeable to plan.  Given opportunity to ask questions.  Appears to feel comfortable with discharge denies any current suicidal or homicidal thought. Patient is also instructed prior to discharge to: Take all medications as prescribed by his/her mental healthcare provider. Report any adverse effects and or reactions from the medicines to his/her outpatient provider promptly. Patient has been instructed & cautioned: To not engage in alcohol and or illegal drug use while on prescription medicines. In the event of worsening symptoms, patient is instructed to call the crisis hotline, 911 and or go to the nearest ED for appropriate evaluation and treatment of symptoms. To follow-up with his/her primary care provider for your other medical issues, concerns and or health care needs.     Signed: Lindell Spar, NP, PMHNP, FNP-BC 10/01/2019, 9:12 AM

## 2019-10-01 NOTE — Progress Notes (Signed)
   10/01/19 0026  Psych Admission Type (Psych Patients Only)  Admission Status Voluntary  Psychosocial Assessment  Patient Complaints Anxiety  Eye Contact Fair  Facial Expression Anxious;Pensive  Affect Anxious;Appropriate to circumstance;Preoccupied  Speech Logical/coherent  Interaction Assertive  Motor Activity Slow  Appearance/Hygiene Disheveled;In scrubs  Behavior Characteristics Cooperative  Mood Depressed  Thought Process  Coherency WDL  Content WDL  Delusions None reported or observed  Perception Hallucinations  Hallucination Auditory  Judgment Impaired  Confusion None  Danger to Self  Current suicidal ideation? Denies  Danger to Others  Danger to Others None reported or observed  D: Patient in dayroom reports she had a good day.  A: Medications administered as prescribed. Support and encouragement provided as needed.  R: Patient remains safe on the unit. Will continue to monitor for safety and stability.

## 2019-10-01 NOTE — Progress Notes (Signed)
Recreation Therapy Notes  Date:  2.3.21 Time: 0930 Location: 300 Hall Group Room  Group Topic: Stress Management  Goal Area(s) Addresses:  Patient will identify positive stress management techniques. Patient will identify benefits of using stress management post d/c.  Behavioral Response: Engaged  Intervention: Stress Management  Activity :  Guided Imagery.  LRT read a script that lead patients on a journey to the beach.  Patients were to listen and follow along as script was read to fully engage in activity.  Education:  Stress Management, Discharge Planning.   Education Outcome: Acknowledges Education  Clinical Observations/Feedback: Pt attended and participated in activity.     Victorino Sparrow, LRT/CTRS         Ria Comment, Trayon Krantz A 10/01/2019 11:03 AM

## 2019-10-01 NOTE — Progress Notes (Signed)
Patient ID: [name], female   DOB: July 27, 1971, 49 y.o.   MRN: [id number]  Patient discharged to home/self care on her own accord.  Patient denies SI, HI and AVH  Upon discharge.  Patient was eager to discharge to home.  Patient acknowledged understanding of all discharge instructions and receipt of personal belongings

## 2019-10-01 NOTE — Discharge Summary (Signed)
Physician Discharge Summary Note  Patient:  [name] is an 49 y.o., female MRN:  [id number] DOB:  [birth year] Patient phone:  [phone] (home)  Patient address:   [address] [place] [address],  Total Time spent with patient: 45 minutes  Date of Admission:  09/27/2019 Date of Discharge: 10/01/2019  Reason for Admission:    History of Present Illness: Patient is seen and examined. Patient is a 49 year old female with a past psychiatric history significant for schizoaffective disorder; bipolar type who presented directly to the behavioral health hospital on 09/27/2019 and what was considered a manic state with disorganized thoughts. She was hyperverbal and rambling at that time. The patient admitted also at that time that she had been having auditory hallucinations. She has been treated recently by the [facility] [name] service, but does not appear to have been seen recently. She is supposed to be taking several medications including Depakote, but her Depakote level on admission was essentially 0. The patient did receive Risperdal while in observation last night, and seems calm her today. She remains somewhat disorganized, and very vague on her symptoms as well as her previous treatment. The last admission then I was able to find was in July 2020. She was hospitalized in [facility]. Her medications at this time consisted of buspirone, Depakote extended release, the Abilify long-acting injection as well as oral Abilify. She also has a history of asthma, and was taking medications for that as well. An emergency room visit on 08/24/2019 for an intractable headache showed her medications included trazodone, albuterol, lidocaine patch, Cogentin, Depakote DR, the long-acting paliperidone injection and oral Risperdal. Review of her admission laboratories revealed a mildly elevated glucose, but essentially normal electrolytes as well as CBC. Her lipids were normal. Her Depakote level  was less than 10. TSH was 0.707. Drug screen is not been obtained as of yet. She was admitted to the hospital for evaluation and stabilization.  Principal Problem: Schizoaffective disorder, bipolar type [facility]) Discharge Diagnoses: Principal Problem:   Schizoaffective disorder, bipolar type ([place]) Active Problems:   Schizophrenia ([place])   Past Psychiatric History: See eval  Past Medical History:  Past Medical History:  Diagnosis Date  . Anemia   . Anxiety   . Asthma   . Bipolar 1 disorder ([place])   . Breast cancer ([place])   . Depression   . Diabetes mellitus without complication ([place])   . Hypertension   . Insomnia, persistent   . Schizophrenic disorder ([place])   . Seizures ([place])     Past Surgical History:  Procedure Laterality Date  . BREAST SURGERY Left   . TONSILLECTOMY     Family History:  Family History  Problem Relation Age of Onset  . Depression Mother   . Gout Mother   . Cancer Father        prostate  . Other Father        lung issue  . Alcoholism Other   . Heart attack Paternal Grandfather   . Heart attack Paternal Grandmother   . Heart attack Maternal Grandmother   . Heart attack Maternal Grandfather   . Depression Son   . Anxiety disorder Son    Family Psychiatric  History: See eval Social History:  Social History   Substance and Sexual Activity  Alcohol Use Not Currently     Social History   Substance and Sexual Activity  Drug Use No    Social History   Socioeconomic History  . Marital status: Single  Spouse name: Not on file  . Number of children: Not on file  . Years of education: Not on file  . Highest education level: Not on file  Occupational History  . Not on file  Tobacco Use  . Smoking status: Former Smoker    Types: Cigarettes  . Smokeless tobacco: Never Used  Substance and Sexual Activity  . Alcohol use: Not Currently  . Drug use: No  . Sexual activity: Not Currently  Other Topics Concern  . Not on file  Social History  Narrative  . Not on file   Social Determinants of Health   Financial Resource Strain:   . Difficulty of Paying Living Expenses: Not on file  Food Insecurity:   . Worried About Charity fundraiser in the Last Year: Not on file  . Ran Out of Food in the Last Year: Not on file  Transportation Needs:   . Lack of Transportation (Medical): Not on file  . Lack of Transportation (Non-Medical): Not on file  Physical Activity:   . Days of Exercise per Week: Not on file  . Minutes of Exercise per Session: Not on file  Stress:   . Feeling of Stress : Not on file  Social Connections:   . Frequency of Communication with Friends and Family: Not on file  . Frequency of Social Gatherings with Friends and Family: Not on file  . Attends Religious Services: Not on file  . Active Member of Clubs or Organizations: Not on file  . Attends Archivist Meetings: Not on file  . Marital Status: Not on file    Hospital Course:    [name] was admitted under routine precautions and was compliant fully with medications, groups and reality based therapy on an individual basis as well as in the group setting.  Med adjustments included reinstitution of paliperidone orally and Depakote and she was of course is continued on long-acting injectable.  She organized very well but the date of the third was noted to be alert fully oriented and cooperative eager to get back to work.  No thoughts of harming self or others and contracting fully.  Stable for release.  No acute positive symptoms no acute dangerousness  Physical Findings: AIMS: Facial and Oral Movements Muscles of Facial Expression: None, normal Lips and Perioral Area: None, normal Jaw: None, normal Tongue: None, normal,Extremity Movements Upper (arms, wrists, hands, fingers): None, normal Lower (legs, knees, ankles, toes): None, normal, Trunk Movements Neck, shoulders, hips: None, normal, Overall Severity Severity of abnormal movements (highest  score from questions above): None, normal Incapacitation due to abnormal movements: None, normal [name] awareness of abnormal movements (rate only patient's report): No Awareness, Dental Status Current problems with teeth and/or dentures?: No Does patient usually wear dentures?: No  CIWA:    COWS:    Musculoskeletal: Strength & Muscle Tone: within normal limits Gait & Station: normal Patient leans: N/A  Psychiatric Specialty Exam: Review of Systems  Blood pressure (!) 117/96, pulse 98, temperature 97.6 F (36.4 C), temperature source Oral, resp. rate 20, height 5\' 6"  (1.676 m), weight 85.3 kg, SpO2 100 %.Body mass index is 30.34 kg/m.  General Appearance: Casual  Eye Contact::  Good  Speech:  Clear and [id number]  Volume:  Normal  Mood:  Euthymic  Affect:  Congruent  Thought Process:  Coherent and Goal Directed  Orientation:  Full (Time, Place, and Person)  Thought Content:  Logical  Suicidal Thoughts:  No  Homicidal Thoughts:  No  Memory:  Recent;   Poor Remote;   Fair  Judgement:  Fair  Insight:  Fair  Psychomotor Activity:  Normal  Concentration:  Good  Recall:  Good  Fund of Knowledge:Good  Language: Good  Akathisia:  Negative  Handed:  Right  AIMS (if indicated):     Assets:  Communication Skills Desire for Improvement  Sleep:  Number of Hours: 5.5  Cognition: WNL  ADL's:  Intact          Has this patient used any form of tobacco in the last 30 days? (Cigarettes, Smokeless Tobacco, Cigars, and/or Pipes) Yes, No  Blood Alcohol level:  Lab Results  Component Value Date   ETH <10 05/02/2019   ETH <10 [id number]    Metabolic Disorder Labs:  Lab Results  Component Value Date   HGBA1C 5.0 08/27/2018   MPG 96.8 08/27/2018   MPG 96.8 11/21/2017   Lab Results  Component Value Date   PROLACTIN 74.6 (H) 09/28/2019   PROLACTIN 4.2 (L) 12/19/2018   Lab Results  Component Value Date   CHOL 186 09/28/2019   TRIG 56 09/28/2019   HDL 52 09/28/2019    CHOLHDL 3.6 09/28/2019   VLDL 11 09/28/2019   LDLCALC 123 (H) 09/28/2019   LDLCALC 97 12/19/2018    See Psychiatric Specialty Exam and Suicide Risk Assessment completed by Attending Physician prior to discharge.  Discharge destination:  Home  Is patient on multiple antipsychotic therapies at discharge:  No   Has Patient had three or more failed trials of antipsychotic monotherapy by history:  No  Recommended Plan for Multiple Antipsychotic Therapies: NA   Allergies as of 10/01/2019      Reactions   Haldol [haloperidol Decanoate] Other (See Comments)   Stiffness, eyes bulging   Haloperidol    Penicillins Nausea And Vomiting   Has patient had a PCN reaction causing immediate rash, facial/tongue/throat swelling, SOB or lightheadedness with hypotension:UNSURE  Has patient had a PCN reaction causing severe rash involving mucus membranes or skin necrosis: UNSURE Has patient had a PCN reaction that required hospitalization:UNSURE Has patient had a PCN reaction occurring within the last 10 years:No If all of the above answers are "NO", then may proceed with Cephalosporin use. CHILDHOOD REACTION   Pollen Extract Other (See Comments)   Seasonal allergies   Shrimp [shellfish Allergy] Rash      Medication List    STOP taking these medications   lidocaine 5 % Commonly known as: Lidoderm     TAKE these medications     Indication  albuterol 108 (90 Base) MCG/ACT inhaler Commonly known as: VENTOLIN HFA Inhale 2 puffs into the lungs every 6 (six) hours as needed for wheezing or shortness of breath.  Indication: Asthma   benztropine 0.5 MG tablet Commonly known as: COGENTIN Take 1 tablet (0.5 mg total) by mouth at bedtime. For prevention of drug induced tremors What changed:   when to take this  additional instructions  Indication: Extrapyramidal Reaction caused by Medications   budesonide-formoterol 160-4.5 MCG/ACT inhaler Commonly known as: SYMBICORT Inhale 2 puffs into  the lungs 2 (two) times daily. For Shortness of breath  Indication: Asthma   divalproex 500 MG DR tablet Commonly known as: DEPAKOTE Take 1 tablet (500 mg total) by mouth every 12 (twelve) hours. For mood stabilization What changed:   when to take this  additional instructions  Indication: Mood stabilization   fluticasone 44 MCG/ACT inhaler Commonly known as: FLOVENT HFA Inhale 2 puffs into the lungs 2 (two)  times daily. For SOB  Indication: Chronic Obstructive Lung Disease   hydrOXYzine 25 MG tablet Commonly known as: ATARAX/VISTARIL Take 1 tablet (25 mg total) by mouth 3 (three) times daily as needed for anxiety.  Indication: Feeling Anxious   Invega Sustenna 156 MG/ML Susy injection Generic drug: paliperidone Inject 1 mL (156 mg total) into the muscle every 30 (thirty) days. (Due on 10-28-19): For mood control Start taking on: October 27, 2019 What changed:   how to take this  additional instructions  Indication: Schizoaffective Disorder   paliperidone 6 MG 24 hr tablet Commonly known as: INVEGA Take 1 tablet (6 mg total) by mouth at bedtime. For mood control  Indication: Mood control   traZODone 50 MG tablet Commonly known as: DESYREL Take 1 tablet (50 mg total) by mouth at bedtime as needed for sleep. What changed:   medication strength  how much to take  Indication: Trouble Sleeping      Follow-up Information    [facility]. Call on 10/02/2019.   Why: [name] services will continue at discharge. Please be sure to call for any additional questions or concerns. Contact numbers for your [name] team are [phone] (Crisis Line) or [phone] (Regular).  Contact information: [address] [place] [address] [phone]           SignedJohnn Hai, MD 10/01/2019, 11:57 AM

## 2019-10-01 NOTE — BHH Suicide Risk Assessment (Signed)
[facility] Discharge Suicide Risk Assessment   Principal Problem: Schizoaffective disorder, bipolar type [facility]) Discharge Diagnoses: Principal Problem:   Schizoaffective disorder, bipolar type ([place]) Active Problems:   Schizophrenia [facility]) Patient is a 49 year old female with a past psychiatric history significant for schizoaffective disorder; bipolar type who presented directly to the behavioral health hospital on 09/27/2019 and what was considered a manic state with disorganized thoughts. She was hyperverbal and rambling at that time. The patient admitted also at that time that she had been having auditory hallucinations. She has been treated recently by the [facility] [name] service, but does not appear to have been seen recently. She is supposed to be taking several medications including Depakote, but her Depakote level on admission was essentially 0.  Total Time spent with patient: 45 minutes  Musculoskeletal: Strength & Muscle Tone: within normal limits Gait & Station: normal Patient leans: N/A  Psychiatric Specialty Exam: Review of Systems  Blood pressure (!) 117/96, pulse 98, temperature 97.6 F (36.4 C), temperature source Oral, resp. rate 20, height 5\' 6"  (1.676 m), weight 85.3 kg, SpO2 100 %.Body mass index is 30.34 kg/m.  General Appearance: Casual  Eye Contact::  Good  Speech:  Clear and [id number]  Volume:  Normal  Mood:  Euthymic  Affect:  Congruent  Thought Process:  Coherent and Goal Directed  Orientation:  Full (Time, Place, and Person)  Thought Content:  Logical  Suicidal Thoughts:  No  Homicidal Thoughts:  No  Memory:  Recent;   Poor Remote;   Fair  Judgement:  Fair  Insight:  Fair  Psychomotor Activity:  Normal  Concentration:  Good  Recall:  Good  Fund of Knowledge:Good  Language: Good  Akathisia:  Negative  Handed:  Right  AIMS (if indicated):     Assets:  Communication Skills Desire for Improvement  Sleep:  Number of Hours: 5.5  Cognition: WNL  ADL's:   Intact   Mental Status Per Nursing Assessment::   On Admission:  NA  Demographic Factors:  Unemployed  Loss Factors: Decrease in vocational status  Historical Factors: NA  Risk Reduction Factors:   Positive therapeutic relationship  Continued Clinical Symptoms:  Previous Psychiatric Diagnoses and Treatments  Cognitive Features That Contribute To Risk:  None    Suicide Risk:  Minimal: No identifiable suicidal ideation.  Patients presenting with no risk factors but with morbid ruminations; may be classified as minimal risk based on the severity of the depressive symptoms  Follow-up Information    [facility]. Call on 10/02/2019.   Why: [name] services will continue at discharge. Please be sure to call for any additional questions or concerns. Contact numbers for your [name] team are [phone] (Crisis Line) or [phone] (Regular).  Contact information: [address] [place] [address] [phone]           Plan Of Care/Follow-up recommendations:  Activity:  full  Yamina Lenis, MD 10/01/2019, 10:29 AM

## 2019-10-09 ENCOUNTER — Other Ambulatory Visit: Payer: Self-pay

## 2019-10-09 ENCOUNTER — Emergency Department (HOSPITAL_COMMUNITY)
Admission: EM | Admit: 2019-10-09 | Discharge: 2019-10-09 | Disposition: A | Discharge status: Home / Self Care | Payer: 59 | Attending: Emergency Medicine | Admitting: Emergency Medicine

## 2019-10-09 ENCOUNTER — Emergency Department (HOSPITAL_COMMUNITY): Payer: 59

## 2019-10-09 ENCOUNTER — Telehealth: Payer: Self-pay | Admitting: *Deleted

## 2019-10-09 DIAGNOSIS — X501XXA Overexertion from prolonged static or awkward postures, initial encounter: Secondary | ICD-10-CM | POA: Diagnosis not present

## 2019-10-09 DIAGNOSIS — Y92039 Unspecified place in apartment as the place of occurrence of the external cause: Secondary | ICD-10-CM | POA: Insufficient documentation

## 2019-10-09 DIAGNOSIS — E119 Type 2 diabetes mellitus without complications: Secondary | ICD-10-CM | POA: Diagnosis not present

## 2019-10-09 DIAGNOSIS — Z79899 Other long term (current) drug therapy: Secondary | ICD-10-CM | POA: Insufficient documentation

## 2019-10-09 DIAGNOSIS — Y999 Unspecified external cause status: Secondary | ICD-10-CM | POA: Diagnosis not present

## 2019-10-09 DIAGNOSIS — S99912A Unspecified injury of left ankle, initial encounter: Secondary | ICD-10-CM | POA: Diagnosis present

## 2019-10-09 DIAGNOSIS — Y9389 Activity, other specified: Secondary | ICD-10-CM | POA: Diagnosis not present

## 2019-10-09 DIAGNOSIS — I10 Essential (primary) hypertension: Secondary | ICD-10-CM | POA: Insufficient documentation

## 2019-10-09 DIAGNOSIS — Z87891 Personal history of nicotine dependence: Secondary | ICD-10-CM | POA: Insufficient documentation

## 2019-10-09 NOTE — Discharge Instructions (Signed)
Your x-ray was normal.  Can wear ankle brace to help for pain, can also take tylenol or motrin for pain. I have left message for social worker-- they should contact you later today to follow-up and see if they can assist with change in housing. Follow-up with your primary care doctor. Return here for any new/acute changes.

## 2019-10-09 NOTE — ED Provider Notes (Addendum)
[facility] DEPT Provider Note   CSN: [id number] Arrival date & time: 10/09/19  0236     History Chief Complaint  Patient presents with  . Ankle Pain    Jalila Goosby is a 49 y.o. female.  The history is provided by the patient and medical records.   49 y.o. F with hx of anemia, bipolar disorder, depression, DM, schizophrenia, presenting to the ED after a fall.  States she lives in an apartment building on second floor and while walking home from work she rolled her left ankle.  No head injury or loss of consciousness.  States left ankle is swollen with pain along the lateral aspect.  Denies any numbness or weakness.  No intervention tried prior to arrival.  Patient is also requesting to talk to a social worker to help her find alternative housing.  States she has had a few falls while trying to go up the stairs and thinks she needs to be somewhere else, maybe assisted living or independent living facility.  She has no issues in her apartment when cooking, cleaning, bathing, or taking care of herself in general.  Past Medical History:  Diagnosis Date  . Anemia   . Anxiety   . Asthma   . Bipolar 1 disorder ([place])   . Breast cancer ([place])   . Depression   . Diabetes mellitus without complication ([place])   . Hypertension   . Insomnia, persistent   . Schizophrenic disorder ([place])   . Seizures [facility])     Patient Active Problem List   Diagnosis Date Noted  . Schizophrenia ([place]) 10/04/2018  . Encounter for medical clearance for patient hold   . Noncompliance with treatment 07/16/2015  . Schizoaffective disorder, bipolar type ([place]) 01/11/2015    Past Surgical History:  Procedure Laterality Date  . BREAST SURGERY Left   . TONSILLECTOMY       OB History    Gravida  2   Para  2   Term  2   Preterm      AB      Living  2     SAB      TAB      Ectopic      Multiple      Live Births  2           Family History  Problem Relation  Age of Onset  . Depression Mother   . Gout Mother   . Cancer Father        prostate  . Other Father        lung issue  . Alcoholism Other   . Heart attack Paternal Grandfather   . Heart attack Paternal Grandmother   . Heart attack Maternal Grandmother   . Heart attack Maternal Grandfather   . Depression Son   . Anxiety disorder Son     Social History   Tobacco Use  . Smoking status: Former Smoker    Types: Cigarettes  . Smokeless tobacco: Never Used  Substance Use Topics  . Alcohol use: Not Currently  . Drug use: No    Home Medications Prior to Admission medications   Medication Sig Start Date End Date Taking? Authorizing Provider  albuterol (PROVENTIL HFA;VENTOLIN HFA) 108 (90 Base) MCG/ACT inhaler Inhale 2 puffs into the lungs every 6 (six) hours as needed for wheezing or shortness of breath. 10/08/18   Lindell Spar I, NP  benztropine (COGENTIN) 0.5 MG tablet Take 1 tablet (0.5 mg total) by  mouth at bedtime. For prevention of drug induced tremors 10/01/19   Lindell Spar I, NP  budesonide-formoterol (SYMBICORT) 160-4.5 MCG/ACT inhaler Inhale 2 puffs into the lungs 2 (two) times daily. For Shortness of breath Patient not taking: Reported on 08/24/2019 10/08/18   Lindell Spar I, NP  divalproex (DEPAKOTE) 500 MG DR tablet Take 1 tablet (500 mg total) by mouth every 12 (twelve) hours. For mood stabilization 10/01/19   Lindell Spar I, NP  fluticasone (FLOVENT HFA) 44 MCG/ACT inhaler Inhale 2 puffs into the lungs 2 (two) times daily. For SOB 10/01/19   Lindell Spar I, NP  hydrOXYzine (ATARAX/VISTARIL) 25 MG tablet Take 1 tablet (25 mg total) by mouth 3 (three) times daily as needed for anxiety. 10/01/19   Lindell Spar I, NP  INVEGA SUSTENNA 156 MG/ML SUSY injection Inject 1 mL (156 mg total) into the muscle every 30 (thirty) days. (Due on 10-28-19): For mood control 10/27/19   Lindell Spar I, NP  paliperidone (INVEGA) 6 MG 24 hr tablet Take 1 tablet (6 mg total) by mouth at bedtime. For mood  control 10/01/19   Lindell Spar I, NP  traZODone (DESYREL) 50 MG tablet Take 1 tablet (50 mg total) by mouth at bedtime as needed for sleep. 10/01/19   Lindell Spar I, NP  ferrous sulfate 325 (65 FE) MG tablet Take 1 tablet (325 mg total) by mouth daily with breakfast. Patient not taking: Reported on 04/20/2019 12/24/18 04/20/19  [name], MD    Allergies    Haldol [haloperidol decanoate], Haloperidol, Penicillins, Pollen extract, and Shrimp [shellfish allergy]  Review of Systems   Review of Systems  Musculoskeletal: Positive for arthralgias.  All other systems reviewed and are negative.   Physical Exam Updated Vital Signs BP 118/76 (BP Location: Right Arm)   Pulse 97   Temp 98 F (36.7 C) (Oral)   Resp 16   SpO2 100%   Physical Exam Vitals and nursing note reviewed.  Constitutional:      Appearance: She is well-developed.  HENT:     Head: Normocephalic and atraumatic.  Eyes:     Conjunctiva/sclera: Conjunctivae normal.     Pupils: Pupils are equal, round, and reactive to light.  Cardiovascular:     Rate and Rhythm: Normal rate and regular rhythm.     Heart sounds: Normal heart sounds.  Pulmonary:     Effort: Pulmonary effort is normal.     Breath sounds: Normal breath sounds.  Abdominal:     General: Bowel sounds are normal.     Palpations: Abdomen is soft.  Musculoskeletal:        General: Normal range of motion.     Cervical back: Normal range of motion.     Comments: Left ankle grossly normal in appearance aside from mild swelling surrounding lateral malleolus, very mild tenderness, no apparent pain with ROM, DP pulse intact, foot warm and well perfused, distal sensation intact  Skin:    General: Skin is warm and dry.  Neurological:     Mental Status: She is alert and oriented to person, place, and time.     ED Results / Procedures / Treatments   Labs (all labs ordered are listed, but only abnormal results are displayed) Labs Reviewed - No data to  display  EKG None  Radiology DG Ankle Complete Left  Result Date: 10/09/2019 CLINICAL DATA:  Fall at home twisted ankle EXAM: LEFT ANKLE COMPLETE - 3+ VIEW COMPARISON:  None. FINDINGS: No acute fracture or dislocation. Mild  soft tissue swelling seen around the ankle. Fragmented enthesophytes seen at the Achilles insertion site. IMPRESSION: No acute osseous abnormality. Electronically Signed   By: Prudencio Pair M.D.   On: 10/09/2019 03:28    Procedures Procedures (including critical care time)  Medications Ordered in ED Medications - No data to display  ED Course  I have reviewed the triage vital signs and the nursing notes.  Pertinent labs & imaging results that were available during my care of the patient were reviewed by me and considered in my medical decision making (see chart for details).    MDM Rules/Calculators/A&P  49 year old female here with left ankle injury after a fall.  Lives in a second story apartment and had a fall while walking home from work.  States he has had a few falls recently.  Does have some mild swelling surrounding the left lateral malleolus but there is no acute deformity.  Foot is neurovascular intact x-rays negative.  ASO given, encouraged tylenol or motrin for pain control.  Patient has requested to speak with social worker for possible assisted living placement.  She is able to care for herself normally, no issues with ADLs, however think she needs to be somewhere else as she is having difficulty with the stairs at her apartment.  At this time, it does not appear that she needs emergent placement so I have placed consult for social work and they may follow-up with her later today at phone number provided in chart.  Patient is comfortable with this.    Can also follow-up with PCP in the interim.  Return here for any new/acute changes.  Final Clinical Impression(s) / ED Diagnoses Final diagnoses:  Injury of left ankle, initial encounter    Rx / DC  Orders ED Discharge Orders    None       Larene Pickett, PA-C 10/09/19 0411    Larene Pickett, PA-C 10/09/19 [id number]    Orpah Greek, MD 10/09/19 [phone]

## 2019-10-09 NOTE — Progress Notes (Signed)
TOC CM referral received for ALF. Attempted call to patient, her phone was not answering. Will try to reach patient 10/10/2019. ED provider updated CM working on referral. Jonnie Finner RN [place], [place] ED TOC CM [phone]

## 2019-10-09 NOTE — ED Triage Notes (Signed)
Per EMS: Patient is coming from home with c/o left ankle pain. Pt reports walking home from work when she rolled her ankle and fell down. Left ankle is swollen and she has a limp with ambulation. A&Ox4.   EMS VITALS: BP 138/92 HR 100 RR 18 SPO2 99% RA CBG 95 TEMP 97.5

## 2019-10-09 NOTE — ED Notes (Signed)
Patient was verbalized discharge instructions. Pt had no further questions at this time. NAD. 

## 2019-11-02 ENCOUNTER — Encounter (HOSPITAL_COMMUNITY): Payer: Self-pay | Admitting: *Deleted

## 2019-11-02 ENCOUNTER — Emergency Department (HOSPITAL_COMMUNITY)
Admission: EM | Admit: 2019-11-02 | Discharge: 2019-11-02 | Disposition: A | Discharge status: Home / Self Care | Payer: 59 | Attending: Emergency Medicine | Admitting: Emergency Medicine

## 2019-11-02 ENCOUNTER — Other Ambulatory Visit: Payer: Self-pay

## 2019-11-02 DIAGNOSIS — I10 Essential (primary) hypertension: Secondary | ICD-10-CM | POA: Insufficient documentation

## 2019-11-02 DIAGNOSIS — R42 Dizziness and giddiness: Secondary | ICD-10-CM | POA: Insufficient documentation

## 2019-11-02 DIAGNOSIS — Z87891 Personal history of nicotine dependence: Secondary | ICD-10-CM | POA: Diagnosis not present

## 2019-11-02 DIAGNOSIS — R630 Anorexia: Secondary | ICD-10-CM | POA: Diagnosis present

## 2019-11-02 DIAGNOSIS — E119 Type 2 diabetes mellitus without complications: Secondary | ICD-10-CM | POA: Insufficient documentation

## 2019-11-02 DIAGNOSIS — Z79899 Other long term (current) drug therapy: Secondary | ICD-10-CM | POA: Diagnosis not present

## 2019-11-02 LAB — CBC WITH DIFFERENTIAL/PLATELET
Abs Immature Granulocytes: 0.02 10*3/uL (ref 0.00–0.07)
Basophils Absolute: 0 10*3/uL (ref 0.0–0.1)
Basophils Relative: 1 %
Eosinophils Absolute: 0.3 10*3/uL (ref 0.0–0.5)
Eosinophils Relative: 4 %
HCT: 36.7 % (ref 36.0–46.0)
Hemoglobin: 11.9 g/dL — ABNORMAL LOW (ref 12.0–15.0)
Immature Granulocytes: 0 %
Lymphocytes Relative: 36 %
Lymphs Abs: 2 10*3/uL (ref 0.7–4.0)
MCH: 31 pg (ref 26.0–34.0)
MCHC: 32.4 g/dL (ref 30.0–36.0)
MCV: 95.6 fL (ref 80.0–100.0)
Monocytes Absolute: 0.4 10*3/uL (ref 0.1–1.0)
Monocytes Relative: 7 %
Neutro Abs: 2.9 10*3/uL (ref 1.7–7.7)
Neutrophils Relative %: 52 %
Platelets: 204 10*3/uL (ref 150–400)
RBC: 3.84 MIL/uL — ABNORMAL LOW (ref 3.87–5.11)
RDW: 13.2 % (ref 11.5–15.5)
WBC: 5.7 10*3/uL (ref 4.0–10.5)
nRBC: 0 % (ref 0.0–0.2)

## 2019-11-02 LAB — BASIC METABOLIC PANEL
Anion gap: 9 (ref 5–15)
BUN: 9 mg/dL (ref 6–20)
CO2: 27 mmol/L (ref 22–32)
Calcium: 9.4 mg/dL (ref 8.9–10.3)
Chloride: 103 mmol/L (ref 98–111)
Creatinine, Ser: 0.56 mg/dL (ref 0.44–1.00)
GFR calc Af Amer: >60 mL/min (ref >60)
GFR calc non Af Amer: >60 mL/min (ref >60)
Glucose, Bld: 96 mg/dL (ref 70–99)
Potassium: 3.8 mmol/L (ref 3.5–5.1)
Sodium: 139 mmol/L (ref 135–145)

## 2019-11-02 MED ORDER — ACETAMINOPHEN 325 MG PO TABS
650.0000 mg | ORAL_TABLET | Freq: Once | ORAL | Status: AC
  Administered 2019-11-02: 650 mg via ORAL
  Filled 2019-11-02: qty 2

## 2019-11-02 NOTE — ED Provider Notes (Signed)
[facility] DEPT Provider Note   CSN: [id number] Arrival date & time: 11/02/19  1510     History Chief Complaint  Patient presents with  . Dizziness    [name] is a 49 y.o. female with a past medical history of hypertension, diabetes, bipolar disorder presenting to the ED for dizziness that began this morning.  States that she spent all day at church and did not eat anything for breakfast.  She did drink a glass of wine last night.  Reports headache and lightheadedness but denies any injuries or falls, numbness in arms or legs, vomiting, abdominal pain or chest pain.  She is requesting food.  HPI     Past Medical History:  Diagnosis Date  . Anemia   . Anxiety   . Asthma   . Bipolar 1 disorder ([place])   . Breast cancer ([place])   . Depression   . Diabetes mellitus without complication ([place])   . Hypertension   . Insomnia, persistent   . Schizophrenic disorder ([place])   . Seizures [facility])     Patient Active Problem List   Diagnosis Date Noted  . Schizophrenia ([place]) 10/04/2018  . Encounter for medical clearance for patient hold   . Noncompliance with treatment 07/16/2015  . Schizoaffective disorder, bipolar type ([place]) 01/11/2015    Past Surgical History:  Procedure Laterality Date  . BREAST SURGERY Left   . TONSILLECTOMY       OB History    Gravida  2   Para  2   Term  2   Preterm      AB      Living  2     SAB      TAB      Ectopic      Multiple      Live Births  2           Family History  Problem Relation Age of Onset  . Depression Mother   . Gout Mother   . Cancer Father        prostate  . Other Father        lung issue  . Alcoholism Other   . Heart attack Paternal Grandfather   . Heart attack Paternal Grandmother   . Heart attack Maternal Grandmother   . Heart attack Maternal Grandfather   . Depression Son   . Anxiety disorder Son     Social History   Tobacco Use  . Smoking status: Former  Smoker    Types: Cigarettes  . Smokeless tobacco: Never Used  Substance Use Topics  . Alcohol use: Not Currently  . Drug use: No    Home Medications Prior to Admission medications   Medication Sig Start Date End Date Taking? Authorizing Provider  albuterol (PROVENTIL HFA;VENTOLIN HFA) 108 (90 Base) MCG/ACT inhaler Inhale 2 puffs into the lungs every 6 (six) hours as needed for wheezing or shortness of breath. 10/08/18  Yes Lindell Spar I, NP  benztropine (COGENTIN) 0.5 MG tablet Take 1 tablet (0.5 mg total) by mouth at bedtime. For prevention of drug induced tremors 10/01/19  Yes Nwoko, Herbert Pun I, NP  budesonide-formoterol (SYMBICORT) 160-4.5 MCG/ACT inhaler Inhale 2 puffs into the lungs 2 (two) times daily. For Shortness of breath 10/08/18  Yes Lindell Spar I, NP  divalproex (DEPAKOTE) 500 MG DR tablet Take 1 tablet (500 mg total) by mouth every 12 (twelve) hours. For mood stabilization 10/01/19  Yes Nwoko, Loleta Dicker, NP  fluticasone (FLOVENT HFA)  44 MCG/ACT inhaler Inhale 2 puffs into the lungs 2 (two) times daily. For SOB 10/01/19  Yes Nwoko, Herbert Pun I, NP  hydrOXYzine (ATARAX/VISTARIL) 25 MG tablet Take 1 tablet (25 mg total) by mouth 3 (three) times daily as needed for anxiety. 10/01/19  Yes Nwoko, Herbert Pun I, NP  INVEGA SUSTENNA 156 MG/ML SUSY injection Inject 1 mL (156 mg total) into the muscle every 30 (thirty) days. (Due on 10-28-19): For mood control 10/27/19  Yes Nwoko, Herbert Pun I, NP  paliperidone (INVEGA) 6 MG 24 hr tablet Take 1 tablet (6 mg total) by mouth at bedtime. For mood control 10/01/19  Yes Lindell Spar I, NP  traZODone (DESYREL) 100 MG tablet Take 100 mg by mouth at bedtime as needed for sleep.  10/24/19  Yes [provider]  traZODone (DESYREL) 50 MG tablet Take 1 tablet (50 mg total) by mouth at bedtime as needed for sleep. Patient not taking: Reported on 11/02/2019 10/01/19   Lindell Spar I, NP  ferrous sulfate 325 (65 FE) MG tablet Take 1 tablet (325 mg total) by mouth daily with  breakfast. Patient not taking: Reported on 04/20/2019 12/24/18 04/20/19  [name], MD    Allergies    Haldol [haloperidol decanoate], Haloperidol, Penicillins, Pollen extract, and Shrimp [shellfish allergy]  Review of Systems   Review of Systems  Constitutional: Negative for appetite change, chills and fever.  HENT: Negative for ear pain, rhinorrhea, sneezing and sore throat.   Eyes: Negative for photophobia and visual disturbance.  Respiratory: Negative for cough, chest tightness, shortness of breath and wheezing.   Cardiovascular: Negative for chest pain and palpitations.  Gastrointestinal: Negative for abdominal pain, blood in stool, constipation, diarrhea, nausea and vomiting.  Genitourinary: Negative for dysuria, hematuria and urgency.  Musculoskeletal: Negative for myalgias.  Skin: Negative for rash.  Neurological: Positive for light-headedness and headaches. Negative for dizziness and weakness.    Physical Exam Updated Vital Signs BP (!) 126/92   Pulse 82   Temp 97.6 F (36.4 C) (Oral)   Resp 18   Ht 5\' 8"  (1.727 m)   Wt 85.3 kg   SpO2 100%   BMI 28.59 kg/m   Physical Exam Vitals and nursing note reviewed.  Constitutional:      General: She is not in acute distress.    Appearance: She is well-developed.  HENT:     Head: Normocephalic and atraumatic.     Nose: Nose normal.  Eyes:     General: No scleral icterus.       Right eye: No discharge.        Left eye: No discharge.     Conjunctiva/sclera: Conjunctivae normal.     Pupils: Pupils are equal, round, and reactive to light.  Cardiovascular:     Rate and Rhythm: Normal rate and regular rhythm.     Heart sounds: Normal heart sounds. No murmur. No friction rub. No gallop.   Pulmonary:     Effort: Pulmonary effort is normal. No respiratory distress.     Breath sounds: Normal breath sounds.  Abdominal:     General: Bowel sounds are normal. There is no distension.     Palpations: Abdomen is soft.      Tenderness: There is no abdominal tenderness. There is no guarding.  Musculoskeletal:        General: Normal range of motion.     Cervical back: Normal range of motion and neck supple.  Skin:    General: Skin is warm and dry.  Findings: No rash.  Neurological:     General: No focal deficit present.     Mental Status: She is alert and oriented to person, place, and time.     Cranial Nerves: No cranial nerve deficit.     Sensory: No sensory deficit.     Motor: No weakness or abnormal muscle tone.     Coordination: Coordination normal.     ED Results / Procedures / Treatments   Labs (all labs ordered are listed, but only abnormal results are displayed) Labs Reviewed  CBC WITH DIFFERENTIAL/PLATELET - Abnormal; Notable for the following components:      Result Value   RBC 3.84 (*)    Hemoglobin 11.9 (*)    All other components within normal limits  BASIC METABOLIC PANEL    EKG None  Radiology No results found.  Procedures Procedures (including critical care time)  Medications Ordered in ED Medications  acetaminophen (TYLENOL) tablet 650 mg (650 mg Oral Given 11/02/19 2005)    ED Course  I have reviewed the triage vital signs and the nursing notes.  Pertinent labs & imaging results that were available during my care of the patient were reviewed by me and considered in my medical decision making (see chart for details).    MDM Rules/Calculators/A&P                      49 year old female with a past medical history of hypertension, diabetes, bipolar disorder presents to the ED for dizziness that began this morning.  Did not eat anything all day because she spent the day at church.  Did drink a glass of wine last night.  She denies any vision changes, numbness in arms or legs.  Does endorse a slight headache.  On exam patient is overall well-appearing.  No deficits neurological exam noted.  She is ambulatory without difficulty.  No neck pain or stiffness noted.  Vital  signs are within normal limits.  CBC, BMP are unremarkable.  Suspect that symptoms could be due to decreased p.o. intake.  I doubt CVA, other neurological or cardiac cause of her symptoms. There are no headache characteristics that are lateralizing or concerning for increased ICP, infectious or vascular cause of her symptoms.  She notes improvement with food and Tylenol given here.  We will have her continue her home medications and return for worsening symptoms.  Patient is hemodynamically stable, in NAD, and able to ambulate in the ED. Evaluation does not show pathology that would require ongoing emergent intervention or inpatient treatment. I have personally reviewed and interpreted all lab work and imaging at today's ED visit. I explained the diagnosis to the patient. Pain has been managed and has no complaints prior to discharge. Patient is comfortable with above plan and is stable for discharge at this time. All questions were answered prior to disposition. Strict return precautions for returning to the ED were discussed. Encouraged follow up with PCP.   An After Visit Summary was printed and given to the patient.   Portions of this note were generated with [employer]. Dictation errors may occur despite best attempts at proofreading.  Final Clinical Impression(s) / ED Diagnoses Final diagnoses:  Decreased appetite    Rx / DC Orders ED Discharge Orders    None       Delia Heady, PA-C 11/02/19 2108    Hayden Rasmussen, MD 11/03/19 1026

## 2019-11-02 NOTE — ED Notes (Signed)
Patient tolerated drink and sandwich well. Patient able to ambulate around room.

## 2019-11-02 NOTE — ED Triage Notes (Signed)
BIB EMS, pt did not eat anything this morning, walked to church, started home and felt dizzy, she stopped and called [id number]

## 2019-11-02 NOTE — ED Notes (Signed)
Patient contacting transport.

## 2019-11-02 NOTE — Discharge Instructions (Addendum)
Continue your home medications as previously prescribed. Return to the ED if you start to develop worsening headache, numbness in arms or legs, neck pain or stiffness, chest pain.

## 2020-01-09 ENCOUNTER — Encounter (HOSPITAL_COMMUNITY): Payer: Self-pay | Admitting: Psychiatry

## 2020-01-09 ENCOUNTER — Observation Stay (HOSPITAL_COMMUNITY)
Admission: AD | Admit: 2020-01-09 | Discharge: 2020-01-11 | Disposition: A | Discharge status: Home / Self Care | Payer: 59 | Attending: Psychiatry | Admitting: Psychiatry

## 2020-01-09 DIAGNOSIS — F419 Anxiety disorder, unspecified: Secondary | ICD-10-CM | POA: Diagnosis not present

## 2020-01-09 DIAGNOSIS — Z888 Allergy status to other drugs, medicaments and biological substances status: Secondary | ICD-10-CM | POA: Diagnosis not present

## 2020-01-09 DIAGNOSIS — I10 Essential (primary) hypertension: Secondary | ICD-10-CM | POA: Diagnosis not present

## 2020-01-09 DIAGNOSIS — Z79899 Other long term (current) drug therapy: Secondary | ICD-10-CM | POA: Insufficient documentation

## 2020-01-09 DIAGNOSIS — F250 Schizoaffective disorder, bipolar type: Secondary | ICD-10-CM | POA: Diagnosis not present

## 2020-01-09 DIAGNOSIS — Z853 Personal history of malignant neoplasm of breast: Secondary | ICD-10-CM | POA: Insufficient documentation

## 2020-01-09 DIAGNOSIS — Z880 Allergy status to penicillin: Secondary | ICD-10-CM | POA: Diagnosis not present

## 2020-01-09 DIAGNOSIS — R45851 Suicidal ideations: Secondary | ICD-10-CM | POA: Diagnosis not present

## 2020-01-09 DIAGNOSIS — J45909 Unspecified asthma, uncomplicated: Secondary | ICD-10-CM | POA: Insufficient documentation

## 2020-01-09 DIAGNOSIS — E119 Type 2 diabetes mellitus without complications: Secondary | ICD-10-CM | POA: Diagnosis not present

## 2020-01-09 DIAGNOSIS — Z20822 Contact with and (suspected) exposure to covid-19: Secondary | ICD-10-CM | POA: Diagnosis not present

## 2020-01-09 DIAGNOSIS — G4700 Insomnia, unspecified: Secondary | ICD-10-CM | POA: Diagnosis not present

## 2020-01-09 DIAGNOSIS — F209 Schizophrenia, unspecified: Secondary | ICD-10-CM | POA: Diagnosis present

## 2020-01-09 DIAGNOSIS — Z87891 Personal history of nicotine dependence: Secondary | ICD-10-CM | POA: Diagnosis not present

## 2020-01-09 NOTE — BH Assessment (Signed)
Assessment Note  [name] is an 49 y.o. female. Pt presents to [facility] as a walk in accompanied by [employer] voluntarily for suicidal thoughts and manic behavior. During assessment pt presents preoccupied, is talking to herself with rapid and pressured speech. Pt speaks very loudly and states, " I went off at the clinic on these white people, tired" Pt reports she had issues at her doctors office and lead to verbal argument, she then called the police because she states she felt she was going to hurt someone. Pt endorses current SI of wanting to shoot herself states she thought about just dying and she wanted to hurt the doctor at the clinic but with no plan. Pt denies SIB but states she was also hallucinating earlier, states she seen pink lights and heard voices talking to her but with no commands. Pt reports she got 2 to 3 hours of sleep daily and denied any symptoms of depression. Pt states she does have a CST team that checks on her,states she did miss a few days of taking medications but states she took some medications earlier today. TTS proceeded to ask more questions, pt then starts talking to herself again and goes off in a tangent and eats her dinner tray.  Pt was incoherent at times due to rapid, pressured speech and preoccupation but was coherent during other parts of assessment. Pt mood preoccupied, affect flat. Pt motor activity normal but she appears in manic phase. Pt had body odor, clothing and hair a bit dishelved. Pt did present to be responding to internal stimuli but not delusional content. Unsure if she can contract for safety at this time, pt also presented manic.  Diagnosis:  F25.0 Schizoaffective Disorder   Past Medical History:  Past Medical History:  Diagnosis Date  . Anemia   . Anxiety   . Asthma   . Bipolar 1 disorder ([place])   . Breast cancer ([place])   . Depression   . Diabetes mellitus without complication ([place])   . Hypertension   . Insomnia, persistent   . Schizophrenic  disorder ([place])   . Seizures ([place])     Past Surgical History:  Procedure Laterality Date  . BREAST SURGERY Left   . TONSILLECTOMY      Family History:  Family History  Problem Relation Age of Onset  . Depression Mother   . Gout Mother   . Cancer Father        prostate  . Other Father        lung issue  . Alcoholism Other   . Heart attack Paternal Grandfather   . Heart attack Paternal Grandmother   . Heart attack Maternal Grandmother   . Heart attack Maternal Grandfather   . Depression Son   . Anxiety disorder Son     Social History:  reports that she has quit smoking. Her smoking use included cigarettes. She has never used smokeless tobacco. She reports previous alcohol use. She reports that she does not use drugs.  Additional Social History:  Alcohol / Drug Use Pain Medications: see MAR Prescriptions: see MAR Over the Counter: see MAR  CIWA: CIWA-Ar BP: (!) 130/95 Pulse Rate: 100 COWS:    Allergies:  Allergies  Allergen Reactions  . Haldol [Haloperidol Decanoate] Other (See Comments)    Stiffness, eyes bulging  . Haloperidol   . Penicillins Nausea And Vomiting    Has patient had a PCN reaction causing immediate rash, facial/tongue/throat swelling, SOB or lightheadedness with hypotension:UNSURE  Has patient had  a PCN reaction causing severe rash involving mucus membranes or skin necrosis: UNSURE Has patient had a PCN reaction that required hospitalization:UNSURE Has patient had a PCN reaction occurring within the last 10 years:No If all of the above answers are "NO", then may proceed with Cephalosporin use. CHILDHOOD REACTION  . Pollen Extract Other (See Comments)    Seasonal allergies  . Shrimp [Shellfish Allergy] Rash    Home Medications: (Not in a hospital admission)   OB/GYN Status:  No LMP recorded. Patient is perimenopausal.  General Assessment Data Location of Assessment: [facility] Assessment Services TTS Assessment: In system Is this a Tele or  Face-to-Face Assessment?: Face-to-Face Is this an Initial Assessment or a Re-assessment for this encounter?: Initial Assessment Patient Accompanied by:: N/A Language Other than English: No Living Arrangements: Other (Comment) What gender do you identify as?: Female Marital status: Single Pregnancy Status: No Living Arrangements: Alone Can pt return to current living arrangement?: Yes Admission Status: Voluntary Is patient capable of signing voluntary admission?: Yes Referral Source: Self/Family/Friend     Crisis Care Plan Living Arrangements: Alone Legal Guardian: Other:(self)  Education Status Is patient currently in school?: No Is the patient employed, unemployed or receiving disability?: Receiving disability income  Risk to self with the past 6 months Suicidal Ideation: Yes-Currently Present Has patient been a risk to self within the past 6 months prior to admission? : No Suicidal Intent: No Has patient had any suicidal intent within the past 6 months prior to admission? : No Is patient at risk for suicide?: Yes Suicidal Plan?: Yes-Currently Present Has patient had any suicidal plan within the past 6 months prior to admission? : No Specify Current Suicidal Plan: shoot self or stab self Access to Means: No What has been your use of drugs/alcohol within the last 12 months?: none(pt denied) Previous Attempts/Gestures: No How many times?: (pt denies) Other Self Harm Risks: (unknown) Triggers for Past Attempts: Unknown Intentional Self Injurious Behavior: None Family Suicide History: Unknown Recent stressful life event(s): Other (Comment), Conflict (Comment) Persecutory voices/beliefs?: No Depression: Yes Depression Symptoms: Feeling angry/irritable Substance abuse history and/or treatment for substance abuse?: No Suicide prevention information given to non-admitted patients: Not applicable  Risk to Others within the past 6 months Homicidal Ideation: Yes-Currently  Present Does patient have any lifetime risk of violence toward others beyond the six months prior to admission? : Unknown Thoughts of Harm to Others: Yes-Currently Present Comment - Thoughts of Harm to Others: thoughts to harm doctor at clinic office Current Homicidal Intent: Yes-Currently Present Current Homicidal Plan: No Access to Homicidal Means: No Identified Victim: doctor at clinic History of harm to others?: No Assessment of Violence: None Noted Does patient have access to weapons?: No Criminal Charges Pending?: No Does patient have a court date: No Is patient on probation?: No  Psychosis Hallucinations: Auditory, Visual Delusions: None noted  Mental Status Report Appearance/Hygiene: Bizarre, Body odor Eye Contact: Good Motor Activity: Freedom of movement Speech: Incoherent, Pressured, Rapid Level of Consciousness: Alert Mood: Preoccupied Affect: Euphoric, Inconsistent with thought content, Preoccupied Anxiety Level: None Thought Processes: Thought Blocking, Tangential Judgement: Partial Orientation: Not oriented Obsessive Compulsive Thoughts/Behaviors: Minimal  Cognitive Functioning Concentration: Fair Memory: Recent Intact Is patient IDD: No Insight: Fair Impulse Control: Poor Appetite: Good Have you had any weight changes? : No Change Sleep: Decreased Total Hours of Sleep: (2 to 3) Vegetative Symptoms: None  ADLScreening [facility] Assessment Services) [name] cognitive ability adequate to safely complete daily activities?: Yes Patient able to express need for  assistance with ADLs?: Yes Independently performs ADLs?: Yes (appropriate for developmental age)  Prior Inpatient Therapy Prior Inpatient Therapy: Yes Prior Therapy Dates: 09/2019(unknown) Prior Therapy Facilty/Provider(s): [facility] Reason for Treatment: schizo affective disorder  Prior Outpatient Therapy Prior Outpatient Therapy: Yes Prior Therapy Dates: (present) Prior Therapy Facilty/Provider(s):  [facility] Reason for Treatment: schizo affective Does patient have an ACCT team?: No Does patient have Intensive In-House Services?  : No Does patient have [facility] services? : No Does patient have P4CC services?: No  ADL Screening (condition at time of admission) [name] cognitive ability adequate to safely complete daily activities?: Yes Patient able to express need for assistance with ADLs?: Yes Independently performs ADLs?: Yes (appropriate for developmental age)          Disposition: Priscille Loveless, FNP recommends overnight observation reassess in the morning.  Disposition Initial Assessment Completed for this Encounter: Yes  On Site Evaluation by:  Antony Contras, [place] with Physician:  Priscille Loveless, [place] 01/09/2020 7:37 PM

## 2020-01-09 NOTE — H&P (Signed)
[facility] Provider Admission PAA/H&P  Patient Identification: [name] MRN:  [id number] Date of Evaluation:  01/09/2020 Chief Complaint:  Psych Eval Principal Diagnosis: Schizophrenia ([place]) Diagnosis:  Principal Problem:   Schizophrenia ([place])   CC: I just need a break from my kids and my family.    History of Present Illness:  [name] is an 49 y.o. female. Pt presents to [facility] as a walk in accompanied by [employer] voluntarily for suicidal thoughts and manic behavior. During assessment pt presents preoccupied, is talking to herself with rapid and pressured speech. Pt speaks very loudly and states, " I went off at the clinic on these white people, tired" Pt reports she had issues at her doctors office and lead to verbal argument, she then called the police because she states she felt she was going to hurt someone. Pt endorses current SI of wanting to shoot herself states she thought about just dying and she wanted to hurt the doctor at the clinic but with no plan. Pt denies SIB but states she was also hallucinating earlier, states she seen pink lights and heard voices talking to her but with no commands. Pt reports she got 2 to 3 hours of sleep and denied any symptoms of depression. Pt states she does have a CST team that checks on her,states she did miss a few days of taking medications but states she took some medications earlier today. TTS proceeded to ask more questions, pt then starts talking to herself again and goes off in a tangent and eats her dinner tray.  Pt was incoherent at times due to rapid, pressured speech and preoccupation but was coherent during other parts of assessment. Pt mood preoccupied, affect flat. Pt motor activity normal but she appears in manic phase. Pt had body odor, clothing and hair a bit di shelved. Pt did present to be responding to internal stimuli but not delusional content. Unsure if she can contract for safety at this time, pt also presented  manic.   Associated Signs/Symptoms: Depression Symptoms:  depressed mood, anhedonia, insomnia, psychomotor agitation, fatigue, feelings of worthlessness/guilt, difficulty concentrating, anxiety, loss of energy/fatigue, disturbed sleep, (Hypo) Manic Symptoms:  Distractibility, Elevated Mood, Flight of Ideas, Impulsivity, Irritable Mood, Labiality of Mood, Anxiety Symptoms:  Excessive Worry, Psychotic Symptoms:  Delusions, Hallucinations: Auditory PTSD Symptoms: Negative Total Time spent with patient: 30 minutes  Past Psychiatric History: Patient has previously been diagnosed with schizoaffective disorder, she has been on Invega long-acting as well as Abilify LAI and oral ABilify tablets.  She has been on lithium, Haldol.  She had an allergic reaction to Haldol decanoate.  She has been admitted to the hospital on multiple occasions.  Her last psychiatric hospitalization appears to have been in Jan 2021 at [facility]. She denies any recent suicide attempts.   Is the patient at risk to self? No.  Has the patient been a risk to self in the past 6 months? No.  Has the patient been a risk to self within the distant past? No.  Is the patient a risk to others? No.  Has the patient been a risk to others in the past 6 months? No.  Has the patient been a risk to others within the distant past? No.   Prior Inpatient Therapy: Prior Inpatient Therapy: Yes Prior Therapy Dates: 09/2019(unknown) Prior Therapy Facilty/Provider(s): [facility] Reason for Treatment: schizo affective disorder Prior Outpatient Therapy: Prior Outpatient Therapy: Yes Prior Therapy Dates: (present) Prior Therapy Facilty/Provider(s): [facility] Reason for Treatment: schizo affective Does  patient have an ACCT team?: No Does patient have Intensive In-House Services?  : No Does patient have [facility] services? : No Does patient have P4CC services?: No  Alcohol Screening:   Substance Abuse History in the last 12 months:   No. Consequences of Substance Abuse: Negative Previous Psychotropic Medications: Yes  Psychological Evaluations: Yes  Past Medical History:  Past Medical History:  Diagnosis Date  . Anemia   . Anxiety   . Asthma   . Bipolar 1 disorder ([place])   . Breast cancer ([place])   . Depression   . Diabetes mellitus without complication ([place])   . Hypertension   . Insomnia, persistent   . Schizophrenic disorder ([place])   . Seizures ([place])     Past Surgical History:  Procedure Laterality Date  . BREAST SURGERY Left   . TONSILLECTOMY     Family History:  Family History  Problem Relation Age of Onset  . Depression Mother   . Gout Mother   . Cancer Father        prostate  . Other Father        lung issue  . Alcoholism Other   . Heart attack Paternal Grandfather   . Heart attack Paternal Grandmother   . Heart attack Maternal Grandmother   . Heart attack Maternal Grandfather   . Depression Son   . Anxiety disorder Son    Family Psychiatric  History: Noncontributory Tobacco Screening:   Social History:  Social History   Substance and Sexual Activity  Alcohol Use Not Currently     Social History   Substance and Sexual Activity  Drug Use No    Additional Social History: Marital status: Single    Pain Medications: see MAR Prescriptions: see MAR Over the Counter: see MAR                    Allergies:   Allergies  Allergen Reactions  . Haldol [Haloperidol Decanoate] Other (See Comments)    Stiffness, eyes bulging  . Haloperidol   . Penicillins Nausea And Vomiting    Has patient had a PCN reaction causing immediate rash, facial/tongue/throat swelling, SOB or lightheadedness with hypotension:UNSURE  Has patient had a PCN reaction causing severe rash involving mucus membranes or skin necrosis: UNSURE Has patient had a PCN reaction that required hospitalization:UNSURE Has patient had a PCN reaction occurring within the last 10 years:No If all of the above answers are  "NO", then may proceed with Cephalosporin use. CHILDHOOD REACTION  . Pollen Extract Other (See Comments)    Seasonal allergies  . Shrimp [Shellfish Allergy] Rash   Lab Results:  No results found for this or any previous visit (from the past 48 hour(s)).  Blood Alcohol level:  Lab Results  Component Value Date   ETH <10 05/02/2019   ETH <10 [id number]    Metabolic Disorder Labs:  Lab Results  Component Value Date   HGBA1C 5.0 08/27/2018   MPG 96.8 08/27/2018   MPG 96.8 11/21/2017   Lab Results  Component Value Date   PROLACTIN 74.6 (H) 09/28/2019   PROLACTIN 4.2 (L) 12/19/2018   Lab Results  Component Value Date   CHOL 186 09/28/2019   TRIG 56 09/28/2019   HDL 52 09/28/2019   CHOLHDL 3.6 09/28/2019   VLDL 11 09/28/2019   LDLCALC 123 (H) 09/28/2019   LDLCALC 97 12/19/2018    Current Medications: Current Outpatient Medications  Medication Sig Dispense Refill  . albuterol (PROVENTIL HFA;VENTOLIN HFA) 108 (90  Base) MCG/ACT inhaler Inhale 2 puffs into the lungs every 6 (six) hours as needed for wheezing or shortness of breath. 1 Inhaler 0  . benztropine (COGENTIN) 0.5 MG tablet Take 1 tablet (0.5 mg total) by mouth at bedtime. For prevention of drug induced tremors 30 tablet 0  . budesonide-formoterol (SYMBICORT) 160-4.5 MCG/ACT inhaler Inhale 2 puffs into the lungs 2 (two) times daily. For Shortness of breath 1 Inhaler 0  . divalproex (DEPAKOTE) 500 MG DR tablet Take 1 tablet (500 mg total) by mouth every 12 (twelve) hours. For mood stabilization 60 tablet 0  . fluticasone (FLOVENT HFA) 44 MCG/ACT inhaler Inhale 2 puffs into the lungs 2 (two) times daily. For SOB 1 Inhaler 12  . hydrOXYzine (ATARAX/VISTARIL) 25 MG tablet Take 1 tablet (25 mg total) by mouth 3 (three) times daily as needed for anxiety. 75 tablet 0  . INVEGA SUSTENNA 156 MG/ML SUSY injection Inject 1 mL (156 mg total) into the muscle every 30 (thirty) days. (Due on 10-28-19): For mood control 1.2 mL 0  .  paliperidone (INVEGA) 6 MG 24 hr tablet Take 1 tablet (6 mg total) by mouth at bedtime. For mood control 30 tablet 0  . traZODone (DESYREL) 100 MG tablet Take 100 mg by mouth at bedtime as needed for sleep.     . traZODone (DESYREL) 50 MG tablet Take 1 tablet (50 mg total) by mouth at bedtime as needed for sleep. (Patient not taking: Reported on 11/02/2019) 30 tablet 0   No current facility-administered medications for this encounter.   PTA Medications: (Not in a hospital admission)   Musculoskeletal: Strength & Muscle Tone: within normal limits Gait & Station: normal Patient leans: N/A  Psychiatric Specialty Exam: Physical Exam  Nursing note and vitals reviewed. Constitutional: She is oriented to person, place, and time. She appears well-developed and well-nourished.  HENT:  Head: Normocephalic and atraumatic.  Respiratory: Effort normal.  Neurological: She is alert and oriented to person, place, and time.    Review of Systems   Blood pressure (!) 130/95, pulse 100, temperature 98.2 F (36.8 C), temperature source Oral, resp. rate 20, SpO2 100 %.There is no height or weight on file to calculate BMI.  General Appearance: Disheveled  Eye Contact:  Fair  Speech:  Normal Rate  Volume:  Decreased  Mood:  Dysphoric  Affect:  Congruent  Thought Process:  Coherent and Descriptions of Associations: Circumstantial  Orientation:  Full (Time, Place, and Person)  Thought Content:  Delusions and Ideas of Reference:   Delusions  Suicidal Thoughts:  No  Homicidal Thoughts:  No  Memory:  Immediate;   Poor Recent;   Poor Remote;   Poor  Judgement:  Impaired  Insight:  Lacking  Psychomotor Activity:  Normal  Concentration:  Concentration: Fair and Attention Span: Poor  Recall:  Poor  Fund of Knowledge:  Fair  Language:  Fair  Akathisia:  Negative  Handed:  Right  AIMS (if indicated):     Assets:  Desire for Improvement Resilience  ADL's:  Intact  Cognition:  WNL  Sleep:        Treatment Plan Summary: Daily contact with patient to assess and evaluate symptoms and progress in treatment, Medication management and Plan Recommend OBS and reassess in the morning.  Resume home medications. Obtain labs.   Observation Level/Precautions:  15 minute checks  Laboratory:  Chemistry Profile   I certify that inpatient services furnished can reasonably be expected to improve the patient's condition.  Suella Broad, FNP 5/14/20218:02 PM

## 2020-01-10 ENCOUNTER — Other Ambulatory Visit: Payer: Self-pay

## 2020-01-10 DIAGNOSIS — F250 Schizoaffective disorder, bipolar type: Secondary | ICD-10-CM

## 2020-01-10 LAB — TSH: TSH: 0.816 u[IU]/mL (ref 0.350–4.500)

## 2020-01-10 LAB — SARS CORONAVIRUS 2 BY RT PCR (HOSPITAL ORDER, PERFORMED IN ~~LOC~~ HOSPITAL LAB): SARS Coronavirus 2: NEGATIVE

## 2020-01-10 MED ORDER — HYDROXYZINE HCL 25 MG PO TABS: 25.0000 mg | ORAL_TABLET | Freq: Three times a day (TID) | ORAL | Status: DC | PRN

## 2020-01-10 MED ORDER — BENZTROPINE MESYLATE 0.5 MG PO TABS
0.5000 mg | ORAL_TABLET | Freq: Every day | ORAL | Status: DC
  Administered 2020-01-10: 0.5 mg via ORAL
  Filled 2020-01-10: qty 1

## 2020-01-10 MED ORDER — TRAZODONE HCL 100 MG PO TABS
100.0000 mg | ORAL_TABLET | Freq: Every evening | ORAL | Status: DC | PRN
  Administered 2020-01-10: 100 mg via ORAL
  Filled 2020-01-10: qty 1

## 2020-01-10 MED ORDER — DIVALPROEX SODIUM 500 MG PO DR TAB
500.0000 mg | DELAYED_RELEASE_TABLET | Freq: Two times a day (BID) | ORAL | Status: DC
  Administered 2020-01-10 – 2020-01-11 (×2): 500 mg via ORAL
  Filled 2020-01-10 (×2): qty 1

## 2020-01-10 MED ORDER — PALIPERIDONE ER 6 MG PO TB24
6.0000 mg | ORAL_TABLET | Freq: Every day | ORAL | Status: DC
  Administered 2020-01-10: 6 mg via ORAL
  Filled 2020-01-10 (×4): qty 1

## 2020-01-10 NOTE — Tx Team (Signed)
Initial Treatment Plan 01/10/2020 12:38 AM [name] [id number]    PATIENT STRESSORS: Medication change or noncompliance Occupational concerns   PATIENT STRENGTHS: Average or above average intelligence Communication skills Supportive family/friends   PATIENT IDENTIFIED PROBLEMS: Medication non adherence  Disturbed thought process  Anxiety                 DISCHARGE CRITERIA:  Improved stabilization in mood, thinking, and/or behavior Motivation to continue treatment in a less acute level of care Verbal commitment to aftercare and medication compliance  PRELIMINARY DISCHARGE PLAN: Outpatient therapy Return to previous living arrangement Return to previous work or school arrangements  PATIENT/FAMILY INVOLVEMENT: This treatment plan has been presented to and reviewed with the patient, [name].  The patient has been given the opportunity to ask questions and make suggestions.  Ronelle Nigh, RN 01/10/2020, 12:38 AM

## 2020-01-10 NOTE — Progress Notes (Signed)
[facility] MD Progress Note  01/10/2020 5:38 PM [name]  MRN:  [id number] Subjective:  "I get angry at times because people are messing with me."  Patient seen and evaluated in person by this provider.  Client presented initially after getting angry as she felt people were making fun of her, some degree of paranoia.  Patient reports that these incidences of people "client jokes "on her trigger her past PTSD.  Requested to be restarted on her medications with the hopes for discharge tomorrow to follow-up with her ACT team.  Principal Problem: Schizoaffective disorder, bipolar type ([place]) Diagnosis: Principal Problem:   Schizoaffective disorder, bipolar type ([place])  Total Time spent with patient: 1 hour  Past Psychiatric History: schizoaffective disorder, bipolar type  Past Medical History:  Past Medical History:  Diagnosis Date  . Anemia   . Anxiety   . Asthma   . Bipolar 1 disorder ([place])   . Breast cancer ([place])   . Depression   . Diabetes mellitus without complication ([place])   . Hypertension   . Insomnia, persistent   . Schizophrenic disorder ([place])   . Seizures ([place])     Past Surgical History:  Procedure Laterality Date  . BREAST SURGERY Left   . TONSILLECTOMY     Family History:  Family History  Problem Relation Age of Onset  . Depression Mother   . Gout Mother   . Cancer Father        prostate  . Other Father        lung issue  . Alcoholism Other   . Heart attack Paternal Grandfather   . Heart attack Paternal Grandmother   . Heart attack Maternal Grandmother   . Heart attack Maternal Grandfather   . Depression Son   . Anxiety disorder Son    Family Psychiatric  History: see above Social History:  Social History   Substance and Sexual Activity  Alcohol Use Not Currently     Social History   Substance and Sexual Activity  Drug Use No    Social History   Socioeconomic History  . Marital status: Single    Spouse name: Not on file  . Number of children:  Not on file  . Years of education: Not on file  . Highest education level: Not on file  Occupational History  . Not on file  Tobacco Use  . Smoking status: Former Smoker    Types: Cigarettes  . Smokeless tobacco: Never Used  Substance and Sexual Activity  . Alcohol use: Not Currently  . Drug use: No  . Sexual activity: Not Currently  Other Topics Concern  . Not on file  Social History Narrative  . Not on file   Social Determinants of Health   Financial Resource Strain:   . Difficulty of Paying Living Expenses:   Food Insecurity:   . Worried About Charity fundraiser in the Last Year:   . Arboriculturist in the Last Year:   Transportation Needs:   . Film/video editor (Medical):   Marland Kitchen Lack of Transportation (Non-Medical):   Physical Activity:   . Days of Exercise per Week:   . Minutes of Exercise per Session:   Stress:   . Feeling of Stress :   Social Connections:   . Frequency of Communication with Friends and Family:   . Frequency of Social Gatherings with Friends and Family:   . Attends Religious Services:   . Active Member of Clubs or Organizations:   .  Attends Archivist Meetings:   Marland Kitchen Marital Status:    Additional Social History:    Pain Medications: see MAR Prescriptions: see MAR Over the Counter: see MAR   Sleep: Fair  Appetite:  Fair  Current Medications: Current Facility-Administered Medications  Medication Dose Route Frequency Provider Last Rate Last Admin  . benztropine (COGENTIN) tablet 0.5 mg  0.5 mg Oral QHS Patrecia Pour, NP      . divalproex (DEPAKOTE) DR tablet 500 mg  500 mg Oral Q12H Patrecia Pour, NP      . hydrOXYzine (ATARAX/VISTARIL) tablet 25 mg  25 mg Oral TID PRN Patrecia Pour, NP      . paliperidone (INVEGA) 24 hr tablet 6 mg  6 mg Oral QHS Patrecia Pour, NP      . traZODone (DESYREL) tablet 100 mg  100 mg Oral QHS PRN Patrecia Pour, NP        Lab Results:  Results for orders placed or performed during the  hospital encounter of 01/09/20 (from the past 48 hour(s))  SARS Coronavirus 2 by RT PCR (hospital order, performed in [facility] hospital lab) Nasopharyngeal Nasopharyngeal Swab     Status: None   Collection Time: 01/09/20  9:46 PM   Specimen: Nasopharyngeal Swab  Result Value Ref Range   SARS Coronavirus 2 NEGATIVE NEGATIVE    Comment: (NOTE) SARS-CoV-2 target nucleic acids are NOT DETECTED. The SARS-CoV-2 RNA is generally detectable in upper and lower respiratory specimens during the acute phase of infection. The lowest concentration of SARS-CoV-2 viral copies this assay can detect is 250 copies / mL. A negative result does not preclude SARS-CoV-2 infection and should not be used as the sole basis for treatment or other patient management decisions.  A negative result may occur with improper specimen collection / handling, submission of specimen other than nasopharyngeal swab, presence of viral mutation(s) within the areas targeted by this assay, and inadequate number of viral copies (<250 copies / mL). A negative result must be combined with clinical observations, patient history, and epidemiological information. Fact Sheet for Patients:   StrictlyIdeas.no Fact Sheet for Healthcare Providers: BankingDealers.co.za This test is not yet approved or cleared  by the [place] FDA and has been authorized for detection and/or diagnosis of SARS-CoV-2 by FDA under an Emergency Use Authorization (EUA).  This EUA will remain in effect (meaning this test can be used) for the duration of the COVID-19 declaration under Section 564(b)(1) of the Act, 21 U.S.C. section 360bbb-3(b)(1), unless the authorization is terminated or revoked sooner. Performed at [facility], [place] [address], [place], [place] [address]   TSH     Status: None   Collection Time: 01/10/20  6:30 AM  Result Value Ref Range   TSH 0.816 0.350 - 4.500 uIU/mL     Comment: Performed by a 3rd Generation assay with a functional sensitivity of <=0.01 uIU/mL. Performed at [facility], [place] [address], [place], [place] [address]     Blood Alcohol level:  Lab Results  Component Value Date   [facility] <10 05/02/2019   ETH <10 [id number]    Metabolic Disorder Labs: Lab Results  Component Value Date   HGBA1C 5.0 08/27/2018   MPG 96.8 08/27/2018   MPG 96.8 11/21/2017   Lab Results  Component Value Date   PROLACTIN 74.6 (H) 09/28/2019   PROLACTIN 4.2 (L) 12/19/2018   Lab Results  Component Value Date   CHOL 186 09/28/2019  TRIG 56 09/28/2019   HDL 52 09/28/2019   CHOLHDL 3.6 09/28/2019   VLDL 11 09/28/2019   LDLCALC 123 (H) 09/28/2019   LDLCALC 97 12/19/2018   Musculoskeletal: Strength & Muscle Tone: decreased Gait & Station: normal Patient leans: N/A  Psychiatric Specialty Exam: Physical Exam  Nursing note and vitals reviewed. Constitutional: She is oriented to person, place, and time. She appears well-developed and well-nourished.  HENT:  Head: Normocephalic.  Respiratory: Effort normal.  Musculoskeletal:        General: Normal range of motion.     Cervical back: Normal range of motion.  Neurological: She is alert and oriented to person, place, and time.  Psychiatric: Her speech is normal and behavior is normal. Judgment normal. Her affect is blunt. Thought content is paranoid and delusional. Cognition and memory are normal.    Review of Systems  All other systems reviewed and are negative.   Blood pressure 125/81, pulse 88, temperature 98.7 F (37.1 C), temperature source Oral, resp. rate 17, SpO2 100 %.There is no height or weight on file to calculate BMI.  General Appearance: Casual  Eye Contact:  Fair  Speech:  Normal Rate  Volume:  Decreased  Mood:  Irritable at times  Affect:  Blunt  Thought Process:  Coherent and Descriptions of Associations: Intact  Orientation:  Full (Time, Place, and Person)   Thought Content:  Delusions and Paranoid Ideation  Suicidal Thoughts:  No  Homicidal Thoughts:  No  Memory:  Immediate;   Fair Recent;   Fair Remote;   Fair  Judgement:  Fair  Insight:  Fair  Psychomotor Activity:  Decreased  Concentration:  Concentration: Fair and Attention Span: Fair  Recall:  [employer] of Knowledge:  Fair  Language:  Good  Akathisia:  No  Handed:  Right  AIMS (if indicated):     Assets:  Housing Leisure Time Physical Health Resilience Social Support  ADL's:  Intact  Cognition:  WNL  Sleep:       Treatment Plan Summary: Daily contact with patient to assess and evaluate symptoms and progress in treatment, Medication management and Plan schizoaffective disorder, bipolar type:  -Continued Depakote 500 mg BID -Continued Invega 6 mg daily  EPS: -Continued cogentin 0.5 mg at bedtime PRN  Anxiety: -Continued hydroxyzine 25 mg TID PRN  Insomnia: -Continue Trazodone 100 mg at bedtime PRN  Waylan Boga, NP 01/10/2020, 5:38 PM

## 2020-01-10 NOTE — Progress Notes (Signed)
Patient resting in bed this am.  Presents guarded with sullen affect, soft and logical speech, ambulatory with a steady gait. Denies SI, HI, AVH and pain at this time. No hallucinations observed this am. Pt states she ate all of her breakfast this am. Pleasant and cooperative on approach. Denies any issues or concerns this am. Patient states  slept well through the night. Pt remains on Q 15 minutes safety checks and without self harm gestures. Writer encouraged pt to voice concerns. Pt remains safe on unit.

## 2020-01-10 NOTE — Plan of Care (Signed)
Newly admitted. Patient has been in room awake, talking to self. Cooperative during assessment but  thought process disorganized with flight of ideas. Patient talked about a person who tried to ask her questions in order to report to the government.  She is otherwise cooperative and denying thoughts of self harm.

## 2020-01-11 DIAGNOSIS — F250 Schizoaffective disorder, bipolar type: Secondary | ICD-10-CM | POA: Diagnosis not present

## 2020-01-11 MED ORDER — BUDESONIDE-FORMOTEROL FUMARATE 160-4.5 MCG/ACT IN AERO: 2.0000 | INHALATION_SPRAY | Freq: Two times a day (BID) | RESPIRATORY_TRACT | 0 refills | Status: DC

## 2020-01-11 MED ORDER — ALBUTEROL SULFATE HFA 108 (90 BASE) MCG/ACT IN AERS: 2.0000 | INHALATION_SPRAY | Freq: Four times a day (QID) | RESPIRATORY_TRACT | Status: DC | PRN

## 2020-01-11 MED ORDER — FLUTICASONE PROPIONATE HFA 44 MCG/ACT IN AERO: 2.0000 | INHALATION_SPRAY | Freq: Two times a day (BID) | RESPIRATORY_TRACT | 12 refills | Status: DC

## 2020-01-11 NOTE — Progress Notes (Signed)
   01/11/20 0900  Psych Admission Type (Psych Patients Only)  Admission Status Voluntary  Psychosocial Assessment  Patient Complaints None  Eye Contact Fair  Facial Expression Flat  Affect Appropriate to circumstance  Speech Logical/coherent  Interaction Assertive  Motor Activity Other (Comment) (WNL)  Appearance/Hygiene Unremarkable  Behavior Characteristics Cooperative  Mood Pleasant  Thought Process  Coherency WDL  Content WDL  Delusions None reported or observed  Perception WDL  Hallucination None reported or observed  Judgment Impaired  Confusion Mild  Danger to Self  Current suicidal ideation? Denies  Danger to Others  Danger to Others None reported or observed

## 2020-01-11 NOTE — Discharge Instructions (Signed)
Managing Schizoaffective Disorder If you have been diagnosed with schizoaffective disorder (ScAD), you may be relieved to know why you have felt or behaved a certain way. You may also feel overwhelmed about the treatment ahead, how to get the support you need, and how to deal with the condition day-to-day. With care and support, you can learn to manage your symptoms and live with ScAD. ScAD is a chronic, lifelong condition that may occur in cycles. Periods of severe symptoms may be followed by periods of less severe symptoms or improvement. There are steps you can take to help manage ScAD and make your life better. How to manage lifestyle changes Managing stress Stress is your body's reaction to life changes and events, both good and bad. For people with ScAD, stress can cause more severe symptoms to start (can be a trigger), so it is important to learn ways to deal with stress. Your health care provider, therapist, or counselor may suggest techniques such as:  Meditation, muscle relaxation, and breathing exercises.  Music therapy. This can include creating music or listening to music.  Life skills training. This training is focused on work, self-care, money, house management, and social skills. Other things you can do to manage stress include:  Keeping a stress diary. This can help you learn what causes your stress to start and how you can control your response to those triggers.  Exercising. Even a short daily walk can help.  Getting enough sleep.  Making a schedule to manage your time. Knowing what you will do from day to day helps you avoid feeling overwhelmed by tasks and deadlines.  Spending time on hobbies you enjoy that help you relax.  Medicines Your health care provider is likely to prescribe various types of medicine depending on your symptoms. These may include one or more of the following types:  Antipsychotics.  Mood stabilizers.  Antidepressants. Make sure you:  Talk  with your pharmacist or health care provider about all medicines that you take, the possible side effects, and which medicines are safe to take together.  Make it your goal to take part in all treatment decisions (shared decision-making). Ask about possible side effects of medicines that your health care provider recommends, and tell him or her how you feel about having those side effects. It is best if shared decision-making with your health care provider is part of your total treatment plan. Relationships Having the support of your family and friends can play a major role in the success of your treatment. The following steps can help you maintain healthy relationships:  Think about going to couples therapy, family therapy, or family education classes.  Create a written plan for your treatment, and include close family members and friends in the process.  Consider bringing your partner or another family member or friend to the appointments you have with your health care provider. How to recognize changes in your condition If you find that your condition is getting worse, talk to your health care provider right away. Watch for these signs:  Your mood becomes extreme with either emotional highs or the intense lows of depression.  Your speech becomes unclear.  You are disorganized, show the wrong social behaviors, or withdraw from social activities.  You have racing thoughts and have trouble thinking clearly or staying focused.  You hear, see, taste, and believe things that others do not.  You have poor personal hygiene, weight gain or weight loss, or changes in how you are sleeping or eating.  Follow these instructions at home:  Take over-the-counter and prescription medicines only as told by your health care provider. Do not start new medicines or stop taking medicines before you ask your health care provider if it is safe to make those changes.  Avoid caffeine, alcohol, and drugs. They  can affect how your medicine works and can make your symptoms worse.  Eat a healthy diet.  Look for support groups in your area so you can meet other people with your condition. You can learn new methods of managing ScAD by listening to others.  Keep all follow-up visits as told by your health care provider, therapist, or counselor. This is important. Where to find support Talking to others  Reach out to trusted friends or family members, explain your condition, and let them know that you are working with a health care team.  Consider giving educational materials to friends and family.  If you are having trouble telling your friends and family about your condition, keep in mind that honest and open communication can make these conversations easier. Finances Be sure to check with your insurance carrier to find out what treatment options are covered by your plan. You may also be able to find financial assistance through not-for-profit organizations or with local government-based resources. If you are taking medicines, you may be able to get the generic form, which may be less expensive than brand-name medicine. Some makers of prescription medicines also offer help to patients who cannot afford the medicines that they need. Therapy and support groups  Make sure you find a counselor or therapist who is familiar with ScAD. Meet with your counselor or therapist once a week or more often if needed.  Find support programs for people with ScAD. Where to find more information  [employer] on Mental Illness: www.nami.org Contact a health care provider if:  You are not able to take your medicines as prescribed.  Your symptoms get worse. Get help right away if:  You have serious thoughts about hurting yourself or others. If you ever feel like you may hurt yourself or others, or have thoughts about taking your own life, get help right away. You can go to your nearest emergency department or  call:  Your local emergency services (911 in the [place]  A suicide crisis helpline, such as the [facility] at (661) 877-6937. This is open 24 hours a day. Summary  Schizoaffective disorder (ScAD) is a chronic, lifelong illness. It is best controlled with continuous treatment that includes medicine and therapy.  Learning ways to manage stress may help your treatment to work better.  Having the support of your family and friends can be a key to making your treatment a success.  If you find that your condition is getting worse, talk to your health care provider right away. This information is not intended to replace advice given to you by your health care provider. Make sure you discuss any questions you have with your health care provider. Document Revised: 12/06/2018 Document Reviewed: 12/14/2016 Elsevier Patient Education  [place].

## 2020-01-11 NOTE — Progress Notes (Signed)
Patient ID: [name], female   DOB: [birth year], 49 y.o.   MRN: [id number]   Pt alert and oriented on the unit. Education, support, and encouragement provided. Discharge summary, medications and follow up appointments reviewed with pt. Suicide prevention resources provided. Pt's belongings and belongings sheet signed. Pt denies SI/HI, A/VH, pain, or any concerns at this time. Pt ambulatory on and off unit. Pt discharged to lobby.

## 2020-01-11 NOTE — [facility] Suicide Risk Assessment (Cosign Needed)
Suicide Risk Assessment [facility] Discharge Suicide Risk Assessment   Principal Problem: Schizoaffective disorder, bipolar type [facility]) Discharge Diagnoses: Principal Problem:   Schizoaffective disorder, bipolar type [facility])  Patient seen and evaluated in person by this provider.  She was sitting on the side of the bed requesting to discharge.  She reports she slept "well" and is no longer angry.  No suicidal/homicidal ideations, hallucinations, paranoia, or other concerning symptoms.  She plans to return to her psychiatric care team and pursue help at [facility] for mental health rehab.  Dr. Darleene Cleaver also assessed this client and concurs with the treatment plan, psychiatrically cleared for discharge.  Total Time spent with patient: 30 minutes  Musculoskeletal: Strength & Muscle Tone: within normal limits Gait & Station: normal Patient leans: N/A  Psychiatric Specialty Exam:   Blood pressure 125/81, pulse 88, temperature 98.7 F (37.1 C), temperature source Oral, resp. rate 17, SpO2 100 %.There is no height or weight on file to calculate BMI.  General Appearance: Casual  Eye Contact::  Good  Speech:  Normal Rate409  Volume:  Normal  Mood:  Euthymic  Affect:  Blunt  Thought Process:  Coherent and Descriptions of Associations: Intact  Orientation:  Full (Time, Place, and Person)  Thought Content:  WDL and Logical  Suicidal Thoughts:  No  Homicidal Thoughts:  No  Memory:  Immediate;   Good Recent;   Good Remote;   Good  Judgement:  Fair  Insight:  Fair  Psychomotor Activity:  Normal  Concentration:  Good  Recall:  Good  Fund of Knowledge:Good  Language: Good  Akathisia:  No  Handed:  Right  AIMS (if indicated):     Assets:  Housing Leisure Time Physical Health Resilience Social Support  Sleep:     Cognition: WNL  ADL's:  Intact   Mental Status Per Nursing Assessment::   On Admission:  NA  Demographic Factors:  NA  Loss Factors: NA  Historical  Factors: NA  Risk Reduction Factors:   Sense of responsibility to family, Living with another person, especially a relative, Positive social support and Positive therapeutic relationship  Continued Clinical Symptoms:  None  Cognitive Features That Contribute To Risk:  None    Suicide Risk:  Minimal: No identifiable suicidal ideation.  Patients presenting with no risk factors but with morbid ruminations; may be classified as minimal risk based on the severity of the depressive symptoms   Plan Of Care/Follow-up recommendations:  Plan schizoaffective disorder, bipolar type:  -Continued Depakote 500 mg BID -Continued Invega 6 mg daily -Follow up with ACT team, [facility], and other mental health services  EPS: -Continued cogentin 0.5 mg at bedtime PRN  Anxiety: -Continued hydroxyzine 25 mg TID PRN  Insomnia: -Continue Trazodone 100 mg at bedtime PRN   Activity:  as tolerated Diet:  heart healthy diet  Waylan Boga, NP 01/11/2020, 5:43 PM

## 2020-01-11 NOTE — Progress Notes (Signed)
Pt currently on 200 OBS unit in room 205-1 during this shift 7p-7a. Pt was ambulatory, in day room asking for medications last night, which were then given. Did not endorse SI/HI/AVH to this writer this shift. Appeared to sleep well throughout the night, respirations even and unlabored during hours of sleep. Pt did come out of room once to speak to the MHT thinking that she was on the adolescent hallway and was confused and irritated. Explained to pt where she was, pt went back to bed with no further issues. Safety maintained.

## 2020-02-02 ENCOUNTER — Inpatient Hospital Stay
Admission: AD | Admit: 2020-02-02 | Discharge: 2020-02-06 | DRG: 885 | Disposition: A | Discharge status: Home / Self Care | Payer: 59 | Source: Intra-hospital | Attending: Psychiatry | Admitting: Psychiatry

## 2020-02-02 ENCOUNTER — Encounter: Payer: Self-pay | Admitting: Psychiatry

## 2020-02-02 ENCOUNTER — Encounter (HOSPITAL_COMMUNITY): Payer: Self-pay

## 2020-02-02 ENCOUNTER — Other Ambulatory Visit: Payer: Self-pay

## 2020-02-02 ENCOUNTER — Emergency Department (HOSPITAL_COMMUNITY)
Admission: EM | Admit: 2020-02-02 | Discharge: 2020-02-02 | Disposition: A | Discharge status: Home / Self Care | Payer: 59 | Attending: Emergency Medicine | Admitting: Emergency Medicine

## 2020-02-02 DIAGNOSIS — R441 Visual hallucinations: Secondary | ICD-10-CM | POA: Insufficient documentation

## 2020-02-02 DIAGNOSIS — Z87891 Personal history of nicotine dependence: Secondary | ICD-10-CM

## 2020-02-02 DIAGNOSIS — Z9119 Patient's noncompliance with other medical treatment and regimen: Secondary | ICD-10-CM

## 2020-02-02 DIAGNOSIS — Z818 Family history of other mental and behavioral disorders: Secondary | ICD-10-CM

## 2020-02-02 DIAGNOSIS — G4700 Insomnia, unspecified: Secondary | ICD-10-CM | POA: Diagnosis present

## 2020-02-02 DIAGNOSIS — Z792 Long term (current) use of antibiotics: Secondary | ICD-10-CM | POA: Insufficient documentation

## 2020-02-02 DIAGNOSIS — I10 Essential (primary) hypertension: Secondary | ICD-10-CM | POA: Diagnosis present

## 2020-02-02 DIAGNOSIS — R45851 Suicidal ideations: Secondary | ICD-10-CM | POA: Diagnosis present

## 2020-02-02 DIAGNOSIS — Z79899 Other long term (current) drug therapy: Secondary | ICD-10-CM | POA: Insufficient documentation

## 2020-02-02 DIAGNOSIS — Z853 Personal history of malignant neoplasm of breast: Secondary | ICD-10-CM | POA: Insufficient documentation

## 2020-02-02 DIAGNOSIS — E119 Type 2 diabetes mellitus without complications: Secondary | ICD-10-CM | POA: Diagnosis present

## 2020-02-02 DIAGNOSIS — R440 Auditory hallucinations: Secondary | ICD-10-CM

## 2020-02-02 DIAGNOSIS — Z880 Allergy status to penicillin: Secondary | ICD-10-CM

## 2020-02-02 DIAGNOSIS — F250 Schizoaffective disorder, bipolar type: Secondary | ICD-10-CM | POA: Insufficient documentation

## 2020-02-02 DIAGNOSIS — Z91013 Allergy to seafood: Secondary | ICD-10-CM | POA: Insufficient documentation

## 2020-02-02 DIAGNOSIS — Z91048 Other nonmedicinal substance allergy status: Secondary | ICD-10-CM | POA: Diagnosis not present

## 2020-02-02 DIAGNOSIS — J449 Chronic obstructive pulmonary disease, unspecified: Secondary | ICD-10-CM | POA: Diagnosis present

## 2020-02-02 DIAGNOSIS — Z20822 Contact with and (suspected) exposure to covid-19: Secondary | ICD-10-CM | POA: Insufficient documentation

## 2020-02-02 DIAGNOSIS — F3110 Bipolar disorder, current episode manic without psychotic features, unspecified: Secondary | ICD-10-CM | POA: Diagnosis present

## 2020-02-02 LAB — COMPREHENSIVE METABOLIC PANEL
ALT: 14 U/L (ref 0–44)
AST: 16 U/L (ref 15–41)
Albumin: 3.9 g/dL (ref 3.5–5.0)
Alkaline Phosphatase: 59 U/L (ref 38–126)
Anion gap: 8 (ref 5–15)
BUN: 11 mg/dL (ref 6–20)
CO2: 26 mmol/L (ref 22–32)
Calcium: 8.9 mg/dL (ref 8.9–10.3)
Chloride: 106 mmol/L (ref 98–111)
Creatinine, Ser: 0.75 mg/dL (ref 0.44–1.00)
GFR calc Af Amer: >60 mL/min (ref >60)
GFR calc non Af Amer: >60 mL/min (ref >60)
Glucose, Bld: 102 mg/dL — ABNORMAL HIGH (ref 70–99)
Potassium: 3.2 mmol/L — ABNORMAL LOW (ref 3.5–5.1)
Sodium: 140 mmol/L (ref 135–145)
Total Bilirubin: 0.7 mg/dL (ref 0.3–1.2)
Total Protein: 7.4 g/dL (ref 6.5–8.1)

## 2020-02-02 LAB — ACETAMINOPHEN LEVEL: Acetaminophen (Tylenol), Serum: <10 ug/mL — ABNORMAL LOW (ref 10–30)

## 2020-02-02 LAB — URINALYSIS, ROUTINE W REFLEX MICROSCOPIC
Bilirubin Urine: NEGATIVE
Glucose, UA: NEGATIVE mg/dL
Hgb urine dipstick: NEGATIVE
Ketones, ur: NEGATIVE mg/dL
Leukocytes,Ua: NEGATIVE
Nitrite: NEGATIVE
Protein, ur: NEGATIVE mg/dL
Specific Gravity, Urine: 1.028 (ref 1.005–1.030)
pH: 5 (ref 5.0–8.0)

## 2020-02-02 LAB — RAPID URINE DRUG SCREEN, HOSP PERFORMED
Amphetamines: NOT DETECTED
Barbiturates: NOT DETECTED
Benzodiazepines: NOT DETECTED
Cocaine: NOT DETECTED
Opiates: NOT DETECTED
Tetrahydrocannabinol: NOT DETECTED

## 2020-02-02 LAB — CBC
HCT: 35.1 % — ABNORMAL LOW (ref 36.0–46.0)
Hemoglobin: 11.5 g/dL — ABNORMAL LOW (ref 12.0–15.0)
MCH: 30.7 pg (ref 26.0–34.0)
MCHC: 32.8 g/dL (ref 30.0–36.0)
MCV: 93.6 fL (ref 80.0–100.0)
Platelets: 201 10*3/uL (ref 150–400)
RBC: 3.75 MIL/uL — ABNORMAL LOW (ref 3.87–5.11)
RDW: 11.9 % (ref 11.5–15.5)
WBC: 5.1 10*3/uL (ref 4.0–10.5)
nRBC: 0 % (ref 0.0–0.2)

## 2020-02-02 LAB — I-STAT BETA HCG BLOOD, ED (MC, WL, AP ONLY): I-stat hCG, quantitative: <5 m[IU]/mL (ref <5)

## 2020-02-02 LAB — VALPROIC ACID LEVEL: Valproic Acid Lvl: <10 ug/mL — ABNORMAL LOW (ref 50.0–100.0)

## 2020-02-02 LAB — ETHANOL: Alcohol, Ethyl (B): <10 mg/dL (ref <10)

## 2020-02-02 LAB — SARS CORONAVIRUS 2 BY RT PCR (HOSPITAL ORDER, PERFORMED IN ~~LOC~~ HOSPITAL LAB): SARS Coronavirus 2: NEGATIVE

## 2020-02-02 LAB — SALICYLATE LEVEL: Salicylate Lvl: <7 mg/dL — ABNORMAL LOW (ref 7.0–30.0)

## 2020-02-02 MED ORDER — MAGNESIUM HYDROXIDE 400 MG/5ML PO SUSP: 30.0000 mL | Freq: Every day | ORAL | Status: DC | PRN

## 2020-02-02 MED ORDER — TEMAZEPAM 15 MG PO CAPS
15.0000 mg | ORAL_CAPSULE | Freq: Every evening | ORAL | Status: DC | PRN
  Administered 2020-02-03 – 2020-02-05 (×2): 15 mg via ORAL
  Filled 2020-02-02 (×2): qty 1

## 2020-02-02 MED ORDER — BENZTROPINE MESYLATE 1 MG PO TABS
0.5000 mg | ORAL_TABLET | Freq: Every day | ORAL | Status: DC
  Administered 2020-02-02 – 2020-02-05 (×4): 0.5 mg via ORAL
  Filled 2020-02-02 (×4): qty 1

## 2020-02-02 MED ORDER — ACETAMINOPHEN 325 MG PO TABS: 650.0000 mg | ORAL_TABLET | Freq: Four times a day (QID) | ORAL | Status: DC | PRN

## 2020-02-02 MED ORDER — BENZTROPINE MESYLATE 0.5 MG PO TABS: 0.5000 mg | ORAL_TABLET | Freq: Every day | ORAL | Status: DC

## 2020-02-02 MED ORDER — DIVALPROEX SODIUM 500 MG PO DR TAB
500.0000 mg | DELAYED_RELEASE_TABLET | Freq: Two times a day (BID) | ORAL | Status: DC
  Administered 2020-02-02: 500 mg via ORAL
  Filled 2020-02-02: qty 1

## 2020-02-02 MED ORDER — POTASSIUM CHLORIDE CRYS ER 20 MEQ PO TBCR
40.0000 meq | EXTENDED_RELEASE_TABLET | Freq: Once | ORAL | Status: AC
  Administered 2020-02-02: 40 meq via ORAL
  Filled 2020-02-02: qty 2

## 2020-02-02 MED ORDER — HYDROXYZINE HCL 25 MG PO TABS
25.0000 mg | ORAL_TABLET | Freq: Three times a day (TID) | ORAL | Status: DC | PRN
  Filled 2020-02-02: qty 1

## 2020-02-02 MED ORDER — ALUM & MAG HYDROXIDE-SIMETH 200-200-20 MG/5ML PO SUSP: 30.0000 mL | ORAL | Status: DC | PRN

## 2020-02-02 MED ORDER — TRAZODONE HCL 100 MG PO TABS: 100.0000 mg | ORAL_TABLET | Freq: Every evening | ORAL | Status: DC | PRN

## 2020-02-02 MED ORDER — DIVALPROEX SODIUM 500 MG PO DR TAB
500.0000 mg | DELAYED_RELEASE_TABLET | Freq: Two times a day (BID) | ORAL | Status: DC
  Administered 2020-02-02 – 2020-02-06 (×8): 500 mg via ORAL
  Filled 2020-02-02 (×8): qty 1

## 2020-02-02 NOTE — ED Provider Notes (Signed)
[name] is a 49 y.o. female with a past medical history of diabetes, hypertension, schizophrenia, bipolar disorder, anxiety presenting to the ED for suicidal ideation, visual and auditory hallucinations.  States that she has been feeling thoughts of wanting to harm herself, states that she will sometimes "hear things that are not happening, like my daughter asking for a cigarette, sometimes I hear my daughter in the house and she is not there."  She is requesting admission to a group home or inpatient psychiatric facility.  She denies any specific plan for suicide.  She denies any homicidal ideation but does state that "sometimes when people make me mad I think about it."  She denies any chest pain, abdominal pain, nausea, vomiting or attempted overdose.  HPI     Past Medical History:  Diagnosis Date   Anemia    Anxiety    Asthma    Bipolar 1 disorder ([place])    Breast cancer ([place])    Depression    Diabetes mellitus without complication ([place])    Hypertension    Insomnia, persistent    Schizophrenic disorder ([place])    Seizures ([place])     Patient Active Problem List   Diagnosis Date Noted   Encounter for medical clearance for patient hold    Noncompliance with treatment 07/16/2015   Schizoaffective disorder, bipolar type ([place]) 01/11/2015    Past Surgical History:  Procedure Laterality Date   BREAST SURGERY Left    TONSILLECTOMY       OB History    Gravida  2   Para  2   Term  2   Preterm      AB      Living  2     SAB      TAB      Ectopic      Multiple      Live Births  2           Family History  Problem Relation Age of Onset   Depression Mother    Gout Mother    Cancer Father        prostate   Other Father    lung issue   Alcoholism Other    Heart attack Paternal Grandfather    Heart attack Paternal Grandmother    Heart attack Maternal Grandmother    Heart attack Maternal Grandfather    Depression Son    Anxiety disorder Son     Social History   Tobacco Use   Smoking status: Former Smoker    Types: Cigarettes   Smokeless tobacco: Never Used  Substance Use Topics   Alcohol use: Not Currently   Drug use: No    Home Medications Prior to Admission medications   Medication Sig Start Date End Date Taking? Authorizing Provider  albuterol (VENTOLIN HFA) 108 (90 Base) MCG/ACT inhaler Inhale 2 puffs into the lungs every 6 (six) hours as needed for wheezing or shortness of breath. 01/11/20  Yes Patrecia Pour, NP  benztropine (COGENTIN) 0.5 MG tablet Take 1 tablet (0.5 mg total) by mouth at bedtime. For prevention of drug induced tremors 10/01/19  Yes Nwoko, Herbert Pun I, NP  budesonide-formoterol (SYMBICORT) 160-4.5 MCG/ACT inhaler Inhale 2 puffs into the lungs 2 (two) times daily. For Shortness of breath  01/11/20  Yes Patrecia Pour, NP  divalproex (DEPAKOTE) 500 MG DR tablet Take 1 tablet (500 mg total) by mouth every 12 (twelve) hours. For mood stabilization 10/01/19  Yes Nwoko, Herbert Pun I, NP  fluticasone (FLOVENT HFA) 44 MCG/ACT inhaler Inhale 2 puffs into the lungs 2 (two) times daily. For SOB 01/11/20  Yes Lord, Asa Saunas, NP  paliperidone (INVEGA SUSTENNA) 234 MG/1.5ML SUSY injection Inject 234 mg into the muscle once.   Yes [provider]  paliperidone (INVEGA) 6 MG 24 hr tablet Take 1 tablet (6 mg total) by mouth at bedtime. For mood control 10/01/19  Yes Lindell Spar I, NP  traZODone (DESYREL) 100 MG tablet Take 100 mg by mouth at bedtime as needed for sleep.  10/24/19  Yes [provider]  INVEGA SUSTENNA 156 MG/ML SUSY injection Inject 1 mL (156 mg total) into the muscle every 30 (thirty) days. (Due on 10-28-19): For mood control Patient not taking: Reported on 02/02/2020  10/27/19   Lindell Spar I, NP  ferrous sulfate 325 (65 FE) MG tablet Take 1 tablet (325 mg total) by mouth daily with breakfast. Patient not taking: Reported on 04/20/2019 12/24/18 04/20/19  [name], MD    Allergies    Haldol [haloperidol decanoate], Haloperidol, Penicillins, Pollen extract, and Shrimp [shellfish allergy]  Review of Systems   Review of Systems  Constitutional: Negative for appetite change, chills and fever.  HENT: Negative for ear pain, rhinorrhea, sneezing and sore throat.   Eyes: Negative for photophobia and visual disturbance.  Respiratory: Negative for cough, chest tightness, shortness of breath and wheezing.   Cardiovascular: Negative for chest pain and palpitations.  Gastrointestinal: Negative for abdominal pain, blood in stool, constipation, diarrhea, nausea and vomiting.  Genitourinary: Negative for dysuria, hematuria and urgency.  Musculoskeletal: Negative for myalgias.  Skin: Negative for rash.  Neurological: Negative for dizziness, weakness and light-headedness.  Psychiatric/Behavioral: Positive for hallucinations and suicidal ideas.    Physical Exam Updated Vital Signs BP 103/80    Pulse 61    Temp 98.5 F (36.9 C) (Oral)    Resp 19    LMP  (LMP Unknown)    SpO2 99%   Physical Exam Vitals and nursing note reviewed.  Constitutional:      General: She is not in acute distress.    Appearance: She is well-developed.  HENT:     Head: Normocephalic and atraumatic.     Nose: Nose normal.  Eyes:     General: No scleral icterus.       Left eye: No discharge.     Conjunctiva/sclera: Conjunctivae normal.  Cardiovascular:     Rate and Rhythm: Normal rate and regular rhythm.     Heart sounds: Normal heart sounds. No murmur. No friction rub. No gallop.   Pulmonary:     Effort: Pulmonary effort is normal. No respiratory distress.     Breath sounds: Normal breath sounds.  Abdominal:     General: Bowel sounds are normal. There is no distension.      Palpations: Abdomen is soft.     Tenderness: There is no abdominal tenderness. There is no guarding.  Musculoskeletal:        General: Normal range of motion.     Cervical back: Normal range of motion and neck supple.  Skin:    General: Skin is warm and dry.     Findings: No rash.  Neurological:     Mental Status: She is alert.     Motor: No  abnormal muscle tone.     Coordination: Coordination normal.     ED Results / Procedures / Treatments   Labs (all labs ordered are listed, but only abnormal results are displayed) Labs Reviewed  COMPREHENSIVE METABOLIC PANEL - Abnormal; Notable for the following components:      Result Value   Potassium 3.2 (*)    Glucose, Bld 102 (*)    All other components within normal limits  SALICYLATE LEVEL - Abnormal; Notable for the following components:   Salicylate Lvl <4.2 (*)    All other components within normal limits  ACETAMINOPHEN LEVEL - Abnormal; Notable for the following components:   Acetaminophen (Tylenol), Serum <10 (*)    All other components within normal limits  CBC - Abnormal; Notable for the following components:   RBC 3.75 (*)    Hemoglobin 11.5 (*)    HCT 35.1 (*)    All other components within normal limits  ETHANOL  RAPID URINE DRUG SCREEN, HOSP PERFORMED  I-STAT BETA HCG BLOOD, ED ([facility], [facility], [facility] ONLY)    EKG None  Radiology No results found.  Procedures Procedures (including critical care time)  Medications Ordered in ED Medications - No data to display  ED Course  I have reviewed the triage vital signs and the nursing notes.  Pertinent labs & imaging results that were available during my care of the patient were reviewed by me and considered in my medical decision making (see chart for details).    MDM Rules/Calculators/A&P                      49 year old female with past medical history of diabetes, hypertension, schizophrenia presenting to the ED for suicidal ideation, visual and auditory  hallucinations.  No specific plan for suicide.  Denies any chest pain, abdominal pain, nausea, vomiting.  She reports chronic back pain.  She does not appear to be hallucinating during my exam.  Medical screening lab work ordered and interpreted, it is unremarkable.  Patient is medically cleared for TTS evaluation and disposition.  4:35 AM TTS recommends overnight obs and reevaluation by psychiatrist in the morning. Patient informed of these recommendations and is agreeable to plan.  Portions of this note were generated with [employer]. Dictation errors may occur despite best attempts at proofreading.  Final Clinical Impression(s) / ED Diagnoses Final diagnoses:  Auditory hallucination  Suicidal ideation    Rx / DC Orders ED Discharge Orders    None       Delia Heady, PA-C 02/02/20 [place], MD 02/02/20 [phone]

## 2020-02-02 NOTE — ED Notes (Signed)
Pt is not able to give urine sample at this time.

## 2020-02-02 NOTE — BH Assessment (Addendum)
[place] [facility] Assessment Progress Note  Per Shuvon Rankin, FNP, this voluntary patient requires hospitalization at this time.  This Probation officer will seek placement for pt.  Shuvon reports that pt receives an [place] injection, the dosage is unknown, as is the due date for the next injection.  At [facility] request, this writer called [facility] to request information.  At 12:36 I called Filomena Jungling with their injectables clinic.  Call rolled to voice mail and I left a message.  At 12:39 I called their ACT Team and spoke to [place].  She agrees to speak with pt's nurse and to have her call me.  Return call is pending as of this writing.  Shuvon has been notified.  Jalene Mullet, [place] [facility] Coordinator [phone]  Addendum:  At 12:55 Sharyne Peach, [facility] ACT team lead, calls back.  She reports that pt is prescribed Invega Sustenna 234 mg once a month, and that her last injection was on 01/06/2020.  This was reported to [facility].  Jalene Mullet, [place] Coordinator [phone]
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
## 2020-02-02 NOTE — Discharge Summary (Signed)
  Patient is to be transferred to [facility] for inpatient psychiatric treatment
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
## 2020-02-02 NOTE — ED Notes (Signed)
Transported to [employer] by [employer]. Belongings, one bag, given to driver. Pt was calm and cooperative.
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
## 2020-02-02 NOTE — ED Notes (Signed)
On admission to the TCU pt is calm and cooperative.  On bag of belongings are in locker 28.

## 2020-02-02 NOTE — BH Assessment (Signed)
Patient has been accepted to [facility].  Accepting physician is Dr. Weber Cooks.  Attending  Physician will be Dr. Weber Cooks.  Patient has been assigned to room 307, by [place] Charge Nurse Demetria.   Call report to [phone].  Representative/Transfer Coordinator is Zayvian Mcmurtry Patient pre-admitted by [facility] Patient Access Shannan Harper)  [facility] ER Staff Marcello Moores H, TTS) made aware of acceptance.

## 2020-02-02 NOTE — ED Notes (Signed)
Called Safe [facility]  

## 2020-02-02 NOTE — Tx Team (Signed)
Initial Treatment Plan 02/02/2020 6:39 PM Adrean Findlay FQM:[id number]    PATIENT STRESSORS: Health problems Other: Paranoia   PATIENT STRENGTHS: Motivation for treatment/growth Supportive family/friends   PATIENT IDENTIFIED PROBLEMS: SI  Depression  Paranoid delusions                 DISCHARGE CRITERIA:  Ability to meet basic life and health needs Improved stabilization in mood, thinking, and/or behavior Need for constant or close observation no longer present Reduction of life-threatening or endangering symptoms to within safe limits  PRELIMINARY DISCHARGE PLAN: Outpatient therapy Return to previous living arrangement  PATIENT/FAMILY INVOLVEMENT: This treatment plan has been presented to and reviewed with the patient, Bryer Gottsch. The patient has been given the opportunity to ask questions and make suggestions.  Nakeysha Pasqual, RN 02/02/2020, 6:39 PM

## 2020-02-02 NOTE — ED Triage Notes (Signed)
Pt reports visual and auditory hallucinations. She states that they are telling her to kill herself. She is also requesting information about inpatient facilities or group homes. Also reports some chronic back pain.

## 2020-02-02 NOTE — ED Notes (Signed)
Report called to [facility] to [facility] RN.

## 2020-02-02 NOTE — ED Notes (Signed)
Lab can add on the valproic acid to labs draw last night.

## 2020-02-02 NOTE — ED Notes (Signed)
Pt has been changed into wine scrubs, wanded by security, and her belongings placed in one white bag placed in the cabinet labeled, "patient belongings 16-18 Resus A."

## 2020-02-02 NOTE — BHH Group Notes (Signed)
[place] Group Notes:  (Nursing/MHT/Case Management/Adjunct)  Date:  02/02/2020  Time:  9:23 PM  Type of Therapy:  Group Therapy  Participation Level:  Did Not Attend   Nehemiah Settle 02/02/2020, 9:23 PM

## 2020-02-02 NOTE — Plan of Care (Signed)
New admission.  Problem: Education: Goal: Knowledge of [place] [facility] Education information/materials will improve Outcome: Not Progressing Goal: Emotional status will improve Outcome: Not Progressing Goal: Mental status will improve Outcome: Not Progressing Goal: Verbalization of understanding the information provided will improve Outcome: Not Progressing   Problem: Health Behavior/Discharge Planning: Goal: Compliance with treatment plan for underlying cause of condition will improve Outcome: Not Progressing   Problem: Safety: Goal: Periods of time without injury will increase Outcome: Not Progressing   Problem: Coping: Goal: Coping ability will improve Outcome: Not Progressing Goal: Will verbalize feelings Outcome: Not Progressing   Problem: Self-Concept: Goal: Level of anxiety will decrease Outcome: Not Progressing   Problem: Education: Goal: Will be free of psychotic symptoms Outcome: Not Progressing

## 2020-02-02 NOTE — BH Assessment (Signed)
[facility] Assessment Progress Note  Per Shuvon Rankin, FNP, this pt requires psychiatric hospitalization.  Ian Malkin, Counselor reports that pt has been accepted to [facility] by Dr Weber Cooks to Rm 307.  Pt has signed Voluntary Admission and Consent for Treatment, as well as Consent to Release Information to the [facility] Team, and a notification call has been placed.  Signed forms have been faxed to [phone].  Pt's nurse, Diane, has been notified, and agrees to call report to [phone].  Pt is to be transported via [employer].     Jalene Mullet, [place] Coordinator [phone]

## 2020-02-03 DIAGNOSIS — F250 Schizoaffective disorder, bipolar type: Principal | ICD-10-CM

## 2020-02-03 MED ORDER — PALIPERIDONE ER 3 MG PO TB24
6.0000 mg | ORAL_TABLET | Freq: Every day | ORAL | Status: DC
  Administered 2020-02-03 – 2020-02-05 (×3): 6 mg via ORAL
  Filled 2020-02-03 (×3): qty 2

## 2020-02-03 MED ORDER — PALIPERIDONE PALMITATE ER 156 MG/ML IM SUSY
156.0000 mg | PREFILLED_SYRINGE | Freq: Once | INTRAMUSCULAR | Status: AC
  Administered 2020-02-03: 156 mg via INTRAMUSCULAR
  Filled 2020-02-03: qty 1

## 2020-02-03 NOTE — Plan of Care (Signed)
  Problem: Education: Goal: Knowledge of [place] [facility] Education information/materials will improve Outcome: Not Progressing Goal: Emotional status will improve Outcome: Not Progressing Goal: Mental status will improve Outcome: Not Progressing Goal: Verbalization of understanding the information provided will improve Outcome: Not Progressing   Problem: Health Behavior/Discharge Planning: Goal: Compliance with treatment plan for underlying cause of condition will improve Outcome: Not Progressing
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
## 2020-02-03 NOTE — Progress Notes (Signed)
Recreation Therapy Notes  Date: 02/03/2020  Time: 9:30 am   Location: Craft room   Behavioral response: N/A   Intervention Topic: Stress Management   Discussion/Intervention: Patient did not attend group.   Clinical Observations/Feedback:  Patient did not attend group.   Marlita Keil LRT/CTRS         Cyndia Degraff 02/03/2020 12:07 PM

## 2020-02-03 NOTE — B[facility] Group Notes (Signed)
LCSW Group Therapy Note  02/03/2020 2:56 PM  Type of Therapy/Topic:  Group Therapy:  Emotion Regulation  Participation Level:  Did Not Attend   Description of Group:   The purpose of this group is to assist patients in learning to regulate negative emotions and experience positive emotions. Patients will be guided to discuss ways in which they have been vulnerable to their negative emotions. These vulnerabilities will be juxtaposed with experiences of positive emotions or situations, and patients will be challenged to use positive emotions to combat negative ones. Special emphasis will be placed on coping with negative emotions in conflict situations, and patients will process healthy conflict resolution skills.  Therapeutic Goals: 1. Patient will identify two positive emotions or experiences to reflect on in order to balance out negative emotions 2. Patient will label two or more emotions that they find the most difficult to experience 3. Patient will demonstrate positive conflict resolution skills through discussion and/or role plays  Summary of Patient Progress: X  Therapeutic Modalities:   Cognitive Behavioral Therapy Feelings Identification Dialectical Behavioral Therapy  Assunta Curtis, MSW, LCSW 02/03/2020 2:56 PM

## 2020-02-03 NOTE — Progress Notes (Signed)
Patient has been in bed all shift. Slept nine hours. Denies SI, HI and AVH. Affect is flat. Eye contact is poor. Some thought blocking present. Forwards very little in conversation. Medication compliant. Has requested no prn medication or voiced any complaints.

## 2020-02-03 NOTE — Progress Notes (Signed)
Pt has been isolative to her room. She did not attend groups but says that she may tomorrow. She has been med compliant. Collier Bullock RN

## 2020-02-03 NOTE — H&P (Signed)
Psychiatric Admission Assessment Adult  Patient Identification: [name] MRN:  [id number] Date of Evaluation:  02/03/2020 Chief Complaint:  Bipolar disorder, manic ([place]) [F31.10] Principal Diagnosis: Schizoaffective disorder, bipolar type ([place]) Diagnosis:  Principal Problem:   Schizoaffective disorder, bipolar type ([place]) Active Problems:   Bipolar disorder, manic ([place])  History of Present Illness: Patient seen and chart reviewed.  This is a 49 year old woman with a history of schizoaffective disorder.  She presented to the emergency room in [place] with complaints of psychotic symptoms including hallucinations and delusions and paranoia along with suicidal thoughts.  On interview today the patient was disorganized and hard to understand for much of her history.  Tells rambling stories about her children and her parents that do not really make much sense.  Eventually admits that her children are actually 57 and 48 years old and live with her mother.  Patient lives by herself.  Has been feeling bad recently.  Admits to recent suicidal thoughts "but not that strong".  She claims she has been compliant with medicine but her drug level of Depakote on presentation was negative.  Denies alcohol or drug abuse.  Patient reportedly had been requesting referral to a group home when she came into [place].  She is regularly seen by an act team. Associated Signs/Symptoms: Depression Symptoms:  depressed mood, psychomotor retardation, suicidal thoughts without plan, (Hypo) Manic Symptoms:  Flight of Ideas, Impulsivity, Irritable Mood, Labiality of Mood, Anxiety Symptoms:  Excessive Worry, Psychotic Symptoms:  Delusions, Hallucinations: Auditory Paranoia, PTSD Symptoms: Negative Total Time spent with patient: 1 hour  Past Psychiatric History: Patient apparently began to have psychotic symptoms shortly after college.  Long history of chronic psychotic disorder.  Multiple prior hospitalizations.   Currently followed by an act team in [place].  She has 2 children ages 29 and 73 who live with the patient's mother.  Patient lives alone sounds like she is lonely quite a bit.  Denies any history of alcohol or drug abuse problems.  She does state that she has had suicide attempts in the past but it has been years ago.  Is the patient at risk to self? Yes.    Has the patient been a risk to self in the past 6 months? No.  Has the patient been a risk to self within the distant past? Yes.    Is the patient a risk to others? No.  Has the patient been a risk to others in the past 6 months? No.  Has the patient been a risk to others within the distant past? No.   Prior Inpatient Therapy:   Prior Outpatient Therapy:    Alcohol Screening: 1. How often do you have a drink containing alcohol?: Never 2. How many drinks containing alcohol do you have on a typical day when you are drinking?: 1 or 2 3. How often do you have six or more drinks on one occasion?: Never AUDIT-C Score: 0 4. How often during the last year have you found that you were not able to stop drinking once you had started?: Never 5. How often during the last year have you failed to do what was normally expected from you because of drinking?: Never 6. How often during the last year have you needed a first drink in the morning to get yourself going after a heavy drinking session?: Never 7. How often during the last year have you had a feeling of guilt of remorse after drinking?: Never 8. How often during the last year have  you been unable to remember what happened the night before because you had been drinking?: Never 9. Have you or someone else been injured as a result of your drinking?: No 10. Has a relative or friend or a doctor or another health worker been concerned about your drinking or suggested you cut down?: No Alcohol Use Disorder Identification Test Final Score (AUDIT): 0 Alcohol Brief Interventions/Follow-up: AUDIT Score <7  follow-up not indicated Substance Abuse History in the last 12 months:  No. Consequences of Substance Abuse: Negative Previous Psychotropic Medications: Yes  Psychological Evaluations: Yes  Past Medical History:  Past Medical History:  Diagnosis Date   Anemia    Anxiety    Asthma    Bipolar 1 disorder ([place])    Breast cancer ([place])    Depression    Diabetes mellitus without complication ([place])    Hypertension    Insomnia, persistent    Schizophrenic disorder ([place])    Seizures ([place])     Past Surgical History:  Procedure Laterality Date   BREAST SURGERY Left    TONSILLECTOMY     Family History:  Family History  Problem Relation Age of Onset   Depression Mother    Gout Mother    Cancer Father        prostate   Other Father        lung issue   Alcoholism Other    Heart attack Paternal Grandfather    Heart attack Paternal Grandmother    Heart attack Maternal Grandmother    Heart attack Maternal Grandfather    Depression Son    Anxiety disorder Son    Family Psychiatric  History: Does not know of any family history Tobacco Screening: Have you used any form of tobacco in the last 30 days? (Cigarettes, Smokeless Tobacco, Cigars, and/or Pipes): No Social History:  Social History   Substance and Sexual Activity  Alcohol Use Not Currently     Social History   Substance and Sexual Activity  Drug Use No    Additional Social History:      Pain Medications: see PTA Prescriptions: see PTA Over the Counter: see PTA History of alcohol / drug use?: No history of alcohol / drug abuse                    Allergies:   Allergies  Allergen Reactions   Haldol [Haloperidol Decanoate] Other (See Comments)    Stiffness, eyes bulging   Haloperidol    Penicillins Nausea And Vomiting    Has patient had a PCN reaction causing immediate rash, facial/tongue/throat swelling, SOB or lightheadedness with hypotension:UNSURE  Has patient had a PCN  reaction causing severe rash involving mucus membranes or skin necrosis: UNSURE Has patient had a PCN reaction that required hospitalization:UNSURE Has patient had a PCN reaction occurring within the last 10 years:No If all of the above answers are "NO", then may proceed with Cephalosporin use. CHILDHOOD REACTION   Pollen Extract Other (See Comments)    Seasonal allergies   Shrimp [Shellfish Allergy] Rash   Lab Results:  Results for orders placed or performed during the hospital encounter of 02/02/20 (from the past 48 hour(s))  Comprehensive metabolic panel     Status: Abnormal   Collection Time: 02/02/20  2:28 AM  Result Value Ref Range   Sodium 140 135 - 145 mmol/L   Potassium 3.2 (L) 3.5 - 5.1 mmol/L   Chloride 106 98 - 111 mmol/L   CO2 26 22 - 32 mmol/L  Glucose, Bld 102 (H) 70 - 99 mg/dL    Comment: Glucose reference range applies only to samples taken after fasting for at least 8 hours.   BUN 11 6 - 20 mg/dL   Creatinine, Ser 0.75 0.44 - 1.00 mg/dL   Calcium 8.9 8.9 - 10.3 mg/dL   Total Protein 7.4 6.5 - 8.1 g/dL   Albumin 3.9 3.5 - 5.0 g/dL   AST 16 15 - 41 U/L   ALT 14 0 - 44 U/L   Alkaline Phosphatase 59 38 - 126 U/L   Total Bilirubin 0.7 0.3 - 1.2 mg/dL   GFR calc non Af Amer >60 >60 mL/min   GFR calc Af Amer >60 >60 mL/min   Anion gap 8 5 - 15    Comment: Performed at [facility], [place] [address] [place], [place] [address]  Ethanol     Status: None   Collection Time: 02/02/20  2:28 AM  Result Value Ref Range   Alcohol, Ethyl (B) <10 <10 mg/dL    Comment: (NOTE) Lowest detectable limit for serum alcohol is 10 mg/dL. For medical purposes only. Performed at [facility], [place] [address], [place], [place] [address]   Salicylate level     Status: Abnormal   Collection Time: 02/02/20  2:28 AM  Result Value Ref Range   Salicylate Lvl <2.7 (L) 7.0 - 30.0 mg/dL    Comment: Performed at [facility], [place]  [address], [place], [place] [address]  Acetaminophen level     Status: Abnormal   Collection Time: 02/02/20  2:28 AM  Result Value Ref Range   Acetaminophen (Tylenol), Serum <10 (L) 10 - 30 ug/mL    Comment: (NOTE) Therapeutic concentrations vary significantly. A range of 10-30 ug/mL  may be an effective concentration for many patients. However, some  are best treated at concentrations outside of this range. Acetaminophen concentrations >150 ug/mL at 4 hours after ingestion  and >50 ug/mL at 12 hours after ingestion are often associated with  toxic reactions. Performed at [facility], [place] [address] [place], [place] [address]   cbc     Status: Abnormal   Collection Time: 02/02/20  2:28 AM  Result Value Ref Range   WBC 5.1 4.0 - 10.5 K/uL   RBC 3.75 (L) 3.87 - 5.11 MIL/uL   Hemoglobin 11.5 (L) 12.0 - 15.0 g/dL   HCT 35.1 (L) 36.0 - 46.0 %   MCV 93.6 80.0 - 100.0 fL   MCH 30.7 26.0 - 34.0 pg   MCHC 32.8 30.0 - 36.0 g/dL   RDW 11.9 11.5 - 15.5 %   Platelets 201 150 - 400 K/uL   nRBC 0.0 0.0 - 0.2 %    Comment: Performed at [facility], [place] [address] [place], [place] [address]  Rapid urine drug screen (hospital performed)     Status: None   Collection Time: 02/02/20  2:28 AM  Result Value Ref Range   Opiates NONE DETECTED NONE DETECTED   Cocaine NONE DETECTED NONE DETECTED   Benzodiazepines NONE DETECTED NONE DETECTED   Amphetamines NONE DETECTED NONE DETECTED   Tetrahydrocannabinol NONE DETECTED NONE DETECTED   Barbiturates NONE DETECTED NONE DETECTED    Comment: (NOTE) DRUG SCREEN FOR MEDICAL PURPOSES ONLY.  IF CONFIRMATION IS NEEDED FOR ANY PURPOSE, NOTIFY LAB WITHIN 5 DAYS. LOWEST DETECTABLE LIMITS FOR URINE DRUG SCREEN Drug Class  Cutoff (ng/mL) Amphetamine and metabolites    1000 Barbiturate and metabolites    200 Benzodiazepine                 053 Tricyclics and metabolites     300 Opiates and metabolites         300 Cocaine and metabolites        300 THC                            50 Performed at [facility], [place] [address], [place], [place] [address]   Valproic acid level     Status: Abnormal   Collection Time: 02/02/20  2:28 AM  Result Value Ref Range   Valproic Acid Lvl <10 (L) 50.0 - 100.0 ug/mL    Comment: Performed at [facility], [place] [address] [place], [place] [address]  Urinalysis, Routine w reflex microscopic     Status: Abnormal   Collection Time: 02/02/20  2:28 AM  Result Value Ref Range   Color, Urine YELLOW YELLOW   APPearance HAZY (A) CLEAR   Specific Gravity, Urine 1.028 1.005 - 1.030   pH 5.0 5.0 - 8.0   Glucose, UA NEGATIVE NEGATIVE mg/dL   Hgb urine dipstick NEGATIVE NEGATIVE   Bilirubin Urine NEGATIVE NEGATIVE   Ketones, ur NEGATIVE NEGATIVE mg/dL   Protein, ur NEGATIVE NEGATIVE mg/dL   Nitrite NEGATIVE NEGATIVE   Leukocytes,Ua NEGATIVE NEGATIVE    Comment: Performed at [place] [address] [place], [place] [address]  I-Stat beta hCG blood, ED     Status: None   Collection Time: 02/02/20  2:34 AM  Result Value Ref Range   I-stat hCG, quantitative <5.0 <5 mIU/mL   Comment 3            Comment:   GEST. AGE      CONC.  (mIU/mL)   <=1 WEEK        5 - 50     2 WEEKS       50 - 500     3 WEEKS       100 - 10,000     4 WEEKS     1,000 - 30,000        FEMALE AND NON-PREGNANT FEMALE:     LESS THAN 5 mIU/mL   SARS Coronavirus 2 by RT PCR (hospital order, performed in [place] hospital lab) Nasopharyngeal Nasopharyngeal Swab     Status: None   Collection Time: 02/02/20  1:18 PM   Specimen: Nasopharyngeal Swab  Result Value Ref Range   SARS Coronavirus 2 NEGATIVE NEGATIVE    Comment: (NOTE) SARS-CoV-2 target nucleic acids are NOT DETECTED. The SARS-CoV-2 RNA is generally detectable in upper and lower respiratory specimens during the acute phase of infection. The lowest concentration of  SARS-CoV-2 viral copies this assay can detect is 250 copies / mL. A negative result does not preclude SARS-CoV-2 infection and should not be used as the sole basis for treatment or other patient management decisions.  A negative result may occur with improper specimen collection / handling, submission of specimen other than nasopharyngeal swab, presence of viral mutation(s) within the areas targeted by this assay, and inadequate number of viral copies (<250 copies / mL). A negative result must be combined with clinical observations, patient history, and epidemiological information. Fact Sheet for Patients:   StrictlyIdeas.no Fact Sheet for Healthcare Providers: BankingDealers.co.za This test  is not yet approved or cleared  by the [place] and has been authorized for detection and/or diagnosis of SARS-CoV-2 by FDA under an Emergency Use Authorization (EUA).  This EUA will remain in effect (meaning this test can be used) for the duration of the COVID-19 declaration under Section 564(b)(1) of the Act, 21 U.S.C. section 360bbb-3(b)(1), unless the authorization is terminated or revoked sooner. Performed at [facility], [place] [address], [place], [place] [address]     Blood Alcohol level:  Lab Results  Component Value Date   ETH <10 02/02/2020   ETH <10 77/41/2878    Metabolic Disorder Labs:  Lab Results  Component Value Date   HGBA1C 5.0 08/27/2018   MPG 96.8 08/27/2018   MPG 96.8 11/21/2017   Lab Results  Component Value Date   PROLACTIN 74.6 (H) 09/28/2019   PROLACTIN 4.2 (L) 12/19/2018   Lab Results  Component Value Date   CHOL 186 09/28/2019   TRIG 56 09/28/2019   HDL 52 09/28/2019   CHOLHDL 3.6 09/28/2019   VLDL 11 09/28/2019   LDLCALC 123 (H) 09/28/2019   LDLCALC 97 12/19/2018    Current Medications: Current Facility-Administered Medications  Medication Dose Route Frequency Provider Last  Rate Last Admin   acetaminophen (TYLENOL) tablet 650 mg  650 mg Oral Q6H PRN Arta Stump, [name], MD       alum & mag hydroxide-simeth (MAALOX/MYLANTA) 200-200-20 MG/5ML suspension 30 mL  30 mL Oral Q4H PRN Onisha Cedeno, [name], MD       benztropine (COGENTIN) tablet 0.5 mg  0.5 mg Oral QHS Alynna Hargrove T, MD   0.5 mg at 02/02/20 2118   divalproex (DEPAKOTE) DR tablet 500 mg  500 mg Oral Q12H Tristyn Demarest, [name], MD   500 mg at 02/03/20 6767   hydrOXYzine (ATARAX/VISTARIL) tablet 25 mg  25 mg Oral TID PRN Nanie Dunkleberger, [name], MD       magnesium hydroxide (MILK OF MAGNESIA) suspension 30 mL  30 mL Oral Daily PRN Reyann Troop, [name], MD       paliperidone (INVEGA SUSTENNA) injection 156 mg  156 mg Intramuscular Once Cesare Sumlin, [name], MD       paliperidone (INVEGA) 24 hr tablet 6 mg  6 mg Oral QHS Geraldyn Shain T, MD       temazepam (RESTORIL) capsule 15 mg  15 mg Oral QHS PRN Henriette Hesser, [name], MD       PTA Medications: Medications Prior to Admission  Medication Sig Dispense Refill Last Dose   albuterol (VENTOLIN HFA) 108 (90 Base) MCG/ACT inhaler Inhale 2 puffs into the lungs every 6 (six) hours as needed for wheezing or shortness of breath.      benztropine (COGENTIN) 0.5 MG tablet Take 1 tablet (0.5 mg total) by mouth at bedtime. For prevention of drug induced tremors 30 tablet 0    budesonide-formoterol (SYMBICORT) 160-4.5 MCG/ACT inhaler Inhale 2 puffs into the lungs 2 (two) times daily. For Shortness of breath 1 Inhaler 0    divalproex (DEPAKOTE) 500 MG DR tablet Take 1 tablet (500 mg total) by mouth every 12 (twelve) hours. For mood stabilization 60 tablet 0    fluticasone (FLOVENT HFA) 44 MCG/ACT inhaler Inhale 2 puffs into the lungs 2 (two) times daily. For SOB 1 Inhaler 12    paliperidone (INVEGA SUSTENNA) 234 MG/1.5ML SUSY injection Inject 234 mg into the muscle once.      paliperidone (INVEGA) 6 MG 24 hr tablet Take 1 tablet (6  mg total) by mouth at bedtime. For mood control 30 tablet 0     traZODone (DESYREL) 100 MG tablet Take 100 mg by mouth at bedtime as needed for sleep.        Musculoskeletal: Strength & Muscle Tone: within normal limits Gait & Station: normal Patient leans: N/A  Psychiatric Specialty Exam: Physical Exam  Nursing note and vitals reviewed. Constitutional: She appears well-developed and well-nourished.  HENT:  Head: Normocephalic and atraumatic.  Eyes: Pupils are equal, round, and reactive to light. Conjunctivae are normal.  Cardiovascular: Regular rhythm and normal heart sounds.  Respiratory: Effort normal. No respiratory distress.  GI: Soft.  Musculoskeletal:        General: Normal range of motion.     Cervical back: Normal range of motion.  Neurological: She is alert.  Skin: Skin is warm and dry.  Psychiatric: Her mood appears anxious. Her affect is labile. Her speech is delayed. She is slowed. Thought content is paranoid and delusional. Cognition and memory are impaired. She expresses impulsivity and inappropriate judgment. She expresses suicidal ideation. She expresses no suicidal plans. She is noncommunicative.    Review of Systems  Constitutional: Negative.   HENT: Negative.   Eyes: Negative.   Respiratory: Negative.   Cardiovascular: Negative.   Gastrointestinal: Negative.   Musculoskeletal: Negative.   Skin: Negative.   Neurological: Negative.   Psychiatric/Behavioral: Positive for decreased concentration, dysphoric mood, hallucinations and suicidal ideas.    Blood pressure 120/87, pulse 79, temperature 98.7 F (37.1 C), temperature source Oral, resp. rate 18, height 5\' 6"  (1.676 m), weight 103.4 kg, SpO2 100 %.Body mass index is 36.8 kg/m.  General Appearance: Disheveled  Eye Contact:  Fair  Speech:  Garbled and Pressured  Volume:  Increased  Mood:  Dysphoric and Irritable  Affect:  Flat and Labile  Thought Process:  Disorganized  Orientation:  Full (Time, Place, and Person)  Thought Content:  Illogical, Delusions,  Hallucinations: Auditory and Paranoid Ideation  Suicidal Thoughts:  Yes.  without intent/plan  Homicidal Thoughts:  No  Memory:  Immediate;   Fair Recent;   Fair Remote;   Fair  Judgement:  Impaired  Insight:  Shallow  Psychomotor Activity:  Decreased  Concentration:  Concentration: Poor  Recall:  [employer] of Knowledge:  Fair  Language:  Fair  Akathisia:  No  Handed:  Right  AIMS (if indicated):     Assets:  Desire for Improvement Housing Physical Health Resilience  ADL's:  Impaired  Cognition:  Impaired,  Mild  Sleep:  Number of Hours: 9    Treatment Plan Summary: Daily contact with patient to assess and evaluate symptoms and progress in treatment, Medication management and Plan Patient reports his been about a month since she last had her long-acting injectable of Invega.  Act team confirms this.  Shot will be given today.  Also continue oral Invega and restart Depakote.  Engage in individual and group therapy and assessment.  If she wants to consider moving into a different place we will contact act team about any change in her outpatient plan.  Observation Level/Precautions:  15 minute checks  Laboratory:  Chemistry Profile UDS  Psychotherapy:    Medications:    Consultations:    Discharge Concerns:    Estimated LOS:  Other:     Physician Treatment Plan for Primary Diagnosis: Schizoaffective disorder, bipolar type ([place]) Long Term Goal(s): Improvement in symptoms so as ready for discharge  Short Term Goals: Ability to disclose and discuss suicidal  ideas, Ability to demonstrate self-control will improve and Ability to identify and develop effective coping behaviors will improve  Physician Treatment Plan for Secondary Diagnosis: Principal Problem:   Schizoaffective disorder, bipolar type ([place]) Active Problems:   Bipolar disorder, manic ([place])  Long Term Goal(s): Improvement in symptoms so as ready for discharge  Short Term Goals: Ability to maintain clinical  measurements within normal limits will improve and Compliance with prescribed medications will improve  I certify that inpatient services furnished can reasonably be expected to improve the patient's condition.    Alethia Berthold, MD 6/8/20215:10 PM

## 2020-02-03 NOTE — Tx Team (Addendum)
Interdisciplinary Treatment and Diagnostic Plan Update  02/03/2020 Time of Session: 9am [name] MRN: [id number]  Principal Diagnosis: <principal problem not specified>  Secondary Diagnoses: Active Problems:   Bipolar disorder, manic ([place])   Current Medications:  Current Facility-Administered Medications  Medication Dose Route Frequency Provider Last Rate Last Admin  . acetaminophen (TYLENOL) tablet 650 mg  650 mg Oral Q6H PRN Clapacs, John T, MD      . alum & mag hydroxide-simeth (MAALOX/MYLANTA) 200-200-20 MG/5ML suspension 30 mL  30 mL Oral Q4H PRN Clapacs, John T, MD      . benztropine (COGENTIN) tablet 0.5 mg  0.5 mg Oral QHS Clapacs, John T, MD   0.5 mg at 02/02/20 2118  . divalproex (DEPAKOTE) DR tablet 500 mg  500 mg Oral Q12H Clapacs, John T, MD   500 mg at 02/03/20 0539  . hydrOXYzine (ATARAX/VISTARIL) tablet 25 mg  25 mg Oral TID PRN Clapacs, John T, MD      . magnesium hydroxide (MILK OF MAGNESIA) suspension 30 mL  30 mL Oral Daily PRN Clapacs, John T, MD      . temazepam (RESTORIL) capsule 15 mg  15 mg Oral QHS PRN Clapacs, [name], MD       PTA Medications: Medications Prior to Admission  Medication Sig Dispense Refill Last Dose  . albuterol (VENTOLIN HFA) 108 (90 Base) MCG/ACT inhaler Inhale 2 puffs into the lungs every 6 (six) hours as needed for wheezing or shortness of breath.     . benztropine (COGENTIN) 0.5 MG tablet Take 1 tablet (0.5 mg total) by mouth at bedtime. For prevention of drug induced tremors 30 tablet 0   . budesonide-formoterol (SYMBICORT) 160-4.5 MCG/ACT inhaler Inhale 2 puffs into the lungs 2 (two) times daily. For Shortness of breath 1 Inhaler 0   . divalproex (DEPAKOTE) 500 MG DR tablet Take 1 tablet (500 mg total) by mouth every 12 (twelve) hours. For mood stabilization 60 tablet 0   . fluticasone (FLOVENT HFA) 44 MCG/ACT inhaler Inhale 2 puffs into the lungs 2 (two) times daily. For SOB 1 Inhaler 12   . paliperidone (INVEGA SUSTENNA) 234  MG/1.5ML SUSY injection Inject 234 mg into the muscle once.     . paliperidone (INVEGA) 6 MG 24 hr tablet Take 1 tablet (6 mg total) by mouth at bedtime. For mood control 30 tablet 0   . traZODone (DESYREL) 100 MG tablet Take 100 mg by mouth at bedtime as needed for sleep.        Patient Stressors: Health problems Other: Paranoia  Patient Strengths: Motivation for treatment/growth Supportive family/friends  Treatment Modalities: Medication Management, Group therapy, Case management,  1 to 1 session with clinician, Psychoeducation, Recreational therapy.   Physician Treatment Plan for Primary Diagnosis: <principal problem not specified> Long Term Goal(s):     Short Term Goals:    Medication Management: Evaluate patient's response, side effects, and tolerance of medication regimen.  Therapeutic Interventions: 1 to 1 sessions, Unit Group sessions and Medication administration.  Evaluation of Outcomes: Not Met  Physician Treatment Plan for Secondary Diagnosis: Active Problems:   Bipolar disorder, manic ([place])  Long Term Goal(s):     Short Term Goals:       Medication Management: Evaluate patient's response, side effects, and tolerance of medication regimen.  Therapeutic Interventions: 1 to 1 sessions, Unit Group sessions and Medication administration.  Evaluation of Outcomes: Not Met   RN Treatment Plan for Primary Diagnosis: <principal problem not specified> Long Term Goal(s): Knowledge  of disease and therapeutic regimen to maintain health will improve  Short Term Goals: Ability to participate in decision making will improve, Ability to verbalize feelings will improve, Ability to disclose and discuss suicidal ideas, Ability to identify and develop effective coping behaviors will improve and Compliance with prescribed medications will improve  Medication Management: RN will administer medications as ordered by provider, will assess and evaluate patient's response and provide  education to patient for prescribed medication. RN will report any adverse and/or side effects to prescribing provider.  Therapeutic Interventions: 1 on 1 counseling sessions, Psychoeducation, Medication administration, Evaluate responses to treatment, Monitor vital signs and CBGs as ordered, Perform/monitor CIWA, COWS, AIMS and Fall Risk screenings as ordered, Perform wound care treatments as ordered.  Evaluation of Outcomes: Not Met   LCSW Treatment Plan for Primary Diagnosis: <principal problem not specified> Long Term Goal(s): Safe transition to appropriate next level of care at discharge, Engage patient in therapeutic group addressing interpersonal concerns.  Short Term Goals: Engage patient in aftercare planning with referrals and resources  Therapeutic Interventions: Assess for all discharge needs, 1 to 1 time with Social worker, Explore available resources and support systems, Assess for adequacy in community support network, Educate family and significant other(s) on suicide prevention, Complete Psychosocial Assessment, Interpersonal group therapy.  Evaluation of Outcomes: Not Met   Progress in Treatment: Attending groups: No. Participating in groups: No. Taking medication as prescribed: Yes. Toleration medication: Yes. Family/Significant other contact made: No, will contact:  when pt gives consent Patient understands diagnosis: No. Discussing patient identified problems/goals with staff: Yes. Medical problems stabilized or resolved: No. Denies suicidal/homicidal ideation: Yes. Issues/concerns per patient self-inventory: No. Other: NA  New problem(s) identified: No, Describe:  None reported  New Short Term/Long Term Goal(s):Attend outpatient treatment, take medication as prescribed, develop and implement healthy coping methods.  Patient Goals:  "Stable housing for my daughter, son and myself"  Discharge Plan or Barriers: Pt will return home and receive outpatient  treatment  Reason for Continuation of Hospitalization: Delusions  Medication stabilization  Estimated Length of Stay:1-7days   Recreational Therapy: Patient: N/A Patient Goal: Patient will engage in groups without prompting or encouragement from LRT x3 group sessions within 5 recreation therapy group sessions  Attendees: Patient:Chardai Scali 02/03/2020 2:09 PM  Physician: Alethia Berthold 02/03/2020 2:09 PM  Nursing: Collier Bullock, [place] 02/03/2020 2:09 PM  RN Care Manager: 02/03/2020 2:09 PM  Social Worker: Sanjuana Kava [place] 02/03/2020 2:09 PM  Recreational Therapist: Isaias Sakai Aliyha Fornes 02/03/2020 2:09 PM  Other:  02/03/2020 2:09 PM  Other:  02/03/2020 2:09 PM  Other: 02/03/2020 2:09 PM    Scribe for Treatment Team: Yvette Rack, LCSW 02/03/2020 2:09 PM

## 2020-02-03 NOTE — Progress Notes (Signed)
Patient remains isolative to room. She reports feeling better. Continues to have some confusion while conversating. She received her evening meds and tolerated without incident.  She denied SI/HI/ AVH depression and anxiety. She remains safe on the unit with 15 minute safety checks.

## 2020-02-03 NOTE — Plan of Care (Signed)
Pt rates depression 5/10. Pt denies anxiety, SI, HI and AVH. Pt was educated on care plan and verbalizes understanding.. Pt was encouraged to attend groups. Collier Bullock RN Problem: Education: Goal: Knowledge of  General Education information/materials will improve Outcome: Progressing Goal: Emotional status will improve Outcome: Progressing Goal: Mental status will improve Outcome: Progressing Goal: Verbalization of understanding the information provided will improve Outcome: Progressing   Problem: Health Behavior/Discharge Planning: Goal: Compliance with treatment plan for underlying cause of condition will improve Outcome: Progressing   Problem: Safety: Goal: Periods of time without injury will increase Outcome: Progressing   Problem: Coping: Goal: Coping ability will improve Outcome: Progressing Goal: Will verbalize feelings Outcome: Progressing   Problem: Self-Concept: Goal: Level of anxiety will decrease Outcome: Progressing   Problem: Education: Goal: Will be free of psychotic symptoms Outcome: Progressing

## 2020-02-03 NOTE — BHH Counselor (Signed)
CSW attempted to complete assessment with pt.  Pt remains disorganized at this time.  CSW will attempt again.  Assunta Curtis, MSW, LCSW 02/03/2020 11:43 AM

## 2020-02-04 NOTE — Plan of Care (Signed)
Pt rates depression 5/10 and anxiety 2/10. Pt denies SI, HI and AVH. Pt was educated on care plan and verbalizes understanding. Pt was encouraged to attend groups. Collier Bullock RN Problem: Education: Goal: Knowledge of [place] [facility] Education information/materials will improve Outcome: Progressing Goal: Emotional status will improve Outcome: Progressing Goal: Mental status will improve Outcome: Progressing Goal: Verbalization of understanding the information provided will improve Outcome: Progressing   Problem: Health Behavior/Discharge Planning: Goal: Compliance with treatment plan for underlying cause of condition will improve Outcome: Progressing   Problem: Safety: Goal: Periods of time without injury will increase Outcome: Progressing   Problem: Coping: Goal: Coping ability will improve Outcome: Progressing Goal: Will verbalize feelings Outcome: Progressing   Problem: Self-Concept: Goal: Level of anxiety will decrease Outcome: Progressing

## 2020-02-04 NOTE — BHH Suicide Risk Assessment (Signed)
[place] INPATIENT:  Family/Significant Other Suicide Prevention Education  Suicide Prevention Education:  Contact Attempts: Lavera Guise, daughter, [phone], has been identified by the patient as the family member/significant other with whom the patient will be residing, and identified as the person(s) who will aid the patient in the event of a mental health crisis.  With written consent from the patient, two attempts were made to provide suicide prevention education, prior to and/or following the patient's discharge.  We were unsuccessful in providing suicide prevention education.  A suicide education pamphlet was given to the patient to share with family/significant other.  Date and time of first attempt: 02/04/2020 at 1:33pm Date and time of second attempt: Second attempt is needed.  CSW left HIPAA compliant voicemail.  Rozann Lesches 02/04/2020, 1:32 PM

## 2020-02-04 NOTE — Progress Notes (Signed)
Pt in bed at change of shift and seemed to sleep well through out the night, care taken over at  2300, no issues to report on shift at this time.

## 2020-02-04 NOTE — BHH Suicide Risk Assessment (Signed)
[place] INPATIENT:  Family/Significant Other Suicide Prevention Education  Suicide Prevention Education:  Education Completed; Zaidee Rion, father, [phone],  has been identified by the patient as the family member/significant other with whom the patient will be residing, and identified as the person(s) who will aid the patient in the event of a mental health crisis (suicidal ideations/suicide attempt).  With written consent from the patient, the family member/significant other has been provided the following suicide prevention education, prior to the and/or following the discharge of the patient.  The suicide prevention education provided includes the following:  Suicide risk factors  Suicide prevention and interventions  National Suicide Hotline telephone number  [facility] assessment telephone number  [facility] Emergency Assistance [place] and/or Residential Mobile Crisis Unit telephone number  Request made of family/significant other to:  Remove weapons (e.g., guns, rifles, knives), all items previously/currently identified as safety concern.    Remove drugs/medications (over-the-counter, prescriptions, illicit drugs), all items previously/currently identified as a safety concern.  The family member/significant other verbalizes understanding of the suicide prevention education information provided.  The family member/significant other agrees to remove the items of safety concern listed above.  Father reports that he is unsure of why the patient is currently hospitalized.  He reports thoughts that she was "doing better".  He reports that patient is usually triggered by stress.  Father reports that she is well when she takes medications, however can be danger to self, or others if not taking her medications.  He reports that pt is current with her [name] team.     Rozann Lesches 02/04/2020, 1:34 PM

## 2020-02-04 NOTE — Progress Notes (Signed)
[facility] MD Progress Note  02/04/2020 3:27 PM [name]  MRN:  [id number] Subjective: Patient seen chart reviewed.  49 year old woman with schizoaffective disorder.  Today she appears more lucid.  She says she is feeling okay but tired.  Somewhat nervous.  Denies suicidal or homicidal thoughts.  Denies feeling paranoid.  She spoke with me about feeling like she needs to move into an assisted living facility because the apartment she lives and is not in a safe area.  She has been cooperative with medicine. Principal Problem: Schizoaffective disorder, bipolar type ([place]) Diagnosis: Principal Problem:   Schizoaffective disorder, bipolar type ([place]) Active Problems:   Bipolar disorder, manic ([place])  Total Time spent with patient: 30 minutes  Past Psychiatric History: Past psychiatric history of schizoaffective disorder  Past Medical History:  Past Medical History:  Diagnosis Date  . Anemia   . Anxiety   . Asthma   . Bipolar 1 disorder ([place])   . Breast cancer ([place])   . Depression   . Diabetes mellitus without complication ([place])   . Hypertension   . Insomnia, persistent   . Schizophrenic disorder ([place])   . Seizures ([place])     Past Surgical History:  Procedure Laterality Date  . BREAST SURGERY Left   . TONSILLECTOMY     Family History:  Family History  Problem Relation Age of Onset  . Depression Mother   . Gout Mother   . Cancer Father        prostate  . Other Father        lung issue  . Alcoholism Other   . Heart attack Paternal Grandfather   . Heart attack Paternal Grandmother   . Heart attack Maternal Grandmother   . Heart attack Maternal Grandfather   . Depression Son   . Anxiety disorder Son    Family Psychiatric  History: See previous Social History:  Social History   Substance and Sexual Activity  Alcohol Use Not Currently     Social History   Substance and Sexual Activity  Drug Use No    Social History   Socioeconomic History  . Marital status: Single   Spouse name: Not on file  . Number of children: Not on file  . Years of education: Not on file  . Highest education level: Not on file  Occupational History  . Not on file  Tobacco Use  . Smoking status: Former Smoker    Types: Cigarettes  . Smokeless tobacco: Never Used  Substance and Sexual Activity  . Alcohol use: Not Currently  . Drug use: No  . Sexual activity: Not Currently  Other Topics Concern  . Not on file  Social History Narrative  . Not on file   Social Determinants of Health   Financial Resource Strain:   . Difficulty of Paying Living Expenses:   Food Insecurity:   . Worried About Charity fundraiser in the Last Year:   . Arboriculturist in the Last Year:   Transportation Needs:   . Film/video editor (Medical):   Marland Kitchen Lack of Transportation (Non-Medical):   Physical Activity:   . Days of Exercise per Week:   . Minutes of Exercise per Session:   Stress:   . Feeling of Stress :   Social Connections:   . Frequency of Communication with Friends and Family:   . Frequency of Social Gatherings with Friends and Family:   . Attends Religious Services:   . Active Member of Clubs or  Organizations:   . Attends Archivist Meetings:   Marland Kitchen Marital Status:    Additional Social History:    Pain Medications: see PTA Prescriptions: see PTA Over the Counter: see PTA History of alcohol / drug use?: No history of alcohol / drug abuse                    Sleep: Fair  Appetite:  Fair  Current Medications: Current Facility-Administered Medications  Medication Dose Route Frequency Provider Last Rate Last Admin  . acetaminophen (TYLENOL) tablet 650 mg  650 mg Oral Q6H PRN Hiran Leard T, MD      . alum & mag hydroxide-simeth (MAALOX/MYLANTA) 200-200-20 MG/5ML suspension 30 mL  30 mL Oral Q4H PRN Dawnmarie Breon T, MD      . benztropine (COGENTIN) tablet 0.5 mg  0.5 mg Oral QHS Fleurette Woolbright T, MD   0.5 mg at 02/03/20 2132  . divalproex (DEPAKOTE) DR  tablet 500 mg  500 mg Oral Q12H Lumi Winslett T, MD   500 mg at 02/04/20 6812  . hydrOXYzine (ATARAX/VISTARIL) tablet 25 mg  25 mg Oral TID PRN Kahleb Mcclane T, MD      . magnesium hydroxide (MILK OF MAGNESIA) suspension 30 mL  30 mL Oral Daily PRN Kiaria Quinnell T, MD      . paliperidone (INVEGA) 24 hr tablet 6 mg  6 mg Oral QHS Nanna Ertle T, MD   6 mg at 02/03/20 2131  . temazepam (RESTORIL) capsule 15 mg  15 mg Oral QHS PRN Domanik [place], [name], MD   15 mg at 02/03/20 2131    Lab Results: No results found for this or any previous visit (from the past 48 hour(s)).  Blood Alcohol level:  Lab Results  Component Value Date   ETH <10 02/02/2020   ETH <10 75/17/0017    Metabolic Disorder Labs: Lab Results  Component Value Date   HGBA1C 5.0 08/27/2018   MPG 96.8 08/27/2018   MPG 96.8 11/21/2017   Lab Results  Component Value Date   PROLACTIN 74.6 (H) 09/28/2019   PROLACTIN 4.2 (L) 12/19/2018   Lab Results  Component Value Date   CHOL 186 09/28/2019   TRIG 56 09/28/2019   HDL 52 09/28/2019   CHOLHDL 3.6 09/28/2019   VLDL 11 09/28/2019   LDLCALC 123 (H) 09/28/2019   LDLCALC 97 12/19/2018    Physical Findings: AIMS:  , ,  ,  ,    CIWA:    COWS:     Musculoskeletal: Strength & Muscle Tone: within normal limits Gait & Station: normal Patient leans: N/A  Psychiatric Specialty Exam: Physical Exam  Nursing note and vitals reviewed. Constitutional: She appears well-developed and well-nourished.  HENT:  Head: Normocephalic and atraumatic.  Eyes: Pupils are equal, round, and reactive to light. Conjunctivae are normal.  Cardiovascular: Regular rhythm and normal heart sounds.  Respiratory: Effort normal. No respiratory distress.  GI: Soft.  Musculoskeletal:        General: Normal range of motion.     Cervical back: Normal range of motion.  Neurological: She is alert.  Skin: Skin is warm and dry.  Psychiatric: Judgment normal. Her affect is blunt. Her speech is delayed. She  is slowed. She expresses no homicidal and no suicidal ideation. She exhibits abnormal recent memory.    Review of Systems  Constitutional: Negative.   HENT: Negative.   Eyes: Negative.   Respiratory: Negative.   Cardiovascular: Negative.   Gastrointestinal: Negative.  Musculoskeletal: Negative.   Skin: Negative.   Neurological: Negative.   Psychiatric/Behavioral: Negative for confusion, sleep disturbance and suicidal ideas. The patient is nervous/anxious.     Blood pressure 120/87, pulse 79, temperature 98.7 F (37.1 C), temperature source Oral, resp. rate 18, height 5\' 6"  (1.676 m), weight 103.4 kg, SpO2 100 %.Body mass index is 36.8 kg/m.  General Appearance: Casual  Eye Contact:  Good  Speech:  Clear and Coherent  Volume:  Normal  Mood:  Euthymic  Affect:  Constricted  Thought Process:  Coherent  Orientation:  Full (Time, Place, and Person)  Thought Content:  Logical  Suicidal Thoughts:  No  Homicidal Thoughts:  No  Memory:  Immediate;   Fair Recent;   Fair Remote;   Fair  Judgement:  Fair  Insight:  Fair  Psychomotor Activity:  Normal  Concentration:  Concentration: Fair  Recall:  [employer] of Knowledge:  Fair  Language:  Fair  Akathisia:  No  Handed:  Right  AIMS (if indicated):     Assets:  Desire for Improvement Housing Physical Health Resilience Social Support  ADL's:  Impaired  Cognition:  Impaired,  Mild  Sleep:  Number of Hours: 9     Treatment Plan Summary: Daily contact with patient to assess and evaluate symptoms and progress in treatment, Medication management and Plan Patient appears to be stabilizing and likely will be ready for discharge in 1 to 2 days.  We talked about the appropriateness of her thoughts of moving into assisted living.  We will try and make sure her act team is aware of her situation prior to discharge.  Alethia Berthold, MD 02/04/2020, 3:27 PM
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
## 2020-02-04 NOTE — Progress Notes (Signed)
Pt has been isolative to her room all day. Pt has been mainly sleeping. She did say that she feels better than she did compared to this morning. Collier Bullock RN

## 2020-02-04 NOTE — Progress Notes (Signed)
Recreation Therapy Notes   Date: 02/04/2020  Time: 9:30 am   Location: Craft room   Behavioral response: N/A   Intervention Topic: Problem Solving    Discussion/Intervention: Patient did not attend group.   Clinical Observations/Feedback:  Patient did not attend group.   Breeann Reposa LRT/CTRS         Kanyah Matsushima 02/04/2020 12:26 PM

## 2020-02-05 NOTE — Progress Notes (Signed)
Recreation Therapy Notes  Date: 02/05/2020  Time: 9:30 am   Location: Craft room   Behavioral response: N/A   Intervention Topic: Leisure   Discussion/Intervention: Patient did not attend group.   Clinical Observations/Feedback:  Patient did not attend group.   Casandra Dallaire LRT/CTRS          Dre Gamino 02/05/2020 11:29 AM

## 2020-02-05 NOTE — BHH Group Notes (Signed)
LCSW Wellness Group Note   02/05/2020 12:30pm  Type of Group and Topic: Psychoeducational Group:  Wellness  Participation Level:  Active  Description of Group  Wellness group introduces the topic and its focus on developing healthy habits across the spectrum and its relationship to a decrease in hospital admissions.  Six areas of wellness are discussed: physical, social spiritual, intellectual, occupational, and emotional.  Patients are asked to consider their current wellness habits and to identify areas of wellness where they are interested and able to focus on improvements.    Therapeutic Goals 1. Patients will understand components of wellness and how they can positively impact overall health.  2. Patients will identify areas of wellness where they have developed good habits. 3. Patients will identify areas of wellness where they would like to make improvements.    Summary of Patient Progress:Pt active and engaged in group, made a number of comments. Good participation overall.  Pt shared that spiritual and intellectual are areas that she is doing well in (reading three books currently) and financial is one areas of wellness where she needs to improve.       Therapeutic Modalities: Cognitive Behavioral Therapy Psychoeducation    Joanne Chars, LCSW

## 2020-02-05 NOTE — Plan of Care (Signed)
  Problem: Education: Goal: Knowledge of [place] [facility] Education information/materials will improve Outcome: Progressing Goal: Emotional status will improve Outcome: Progressing Goal: Mental status will improve Outcome: Progressing Goal: Verbalization of understanding the information provided will improve Outcome: Progressing   Problem: Health Behavior/Discharge Planning: Goal: Compliance with treatment plan for underlying cause of condition will improve Outcome: Progressing   Problem: Safety: Goal: Periods of time without injury will increase Outcome: Progressing   Problem: Coping: Goal: Coping ability will improve Outcome: Progressing Goal: Will verbalize feelings Outcome: Progressing   Problem: Self-Concept: Goal: Level of anxiety will decrease Outcome: Progressing   Problem: Education: Goal: Will be free of psychotic symptoms Outcome: Progressing

## 2020-02-05 NOTE — Progress Notes (Signed)
Patient continues to be isolative to her room. She will engage when approached and will come out for food and meds. She denies SI/HI/AVH  But endorses anxiety and depression. She at first glance is logical and coherent in thought and speech, but as she continues to talk she presents as confused and delusional. She received her prescribed meds and tolerated without incident. She remains safe with 15 minute safety rounds. Informed to contact staff with concerns.

## 2020-02-05 NOTE — BHH Group Notes (Signed)
[facility] Group Notes:  (Nursing/MHT/Case Management/Adjunct)  Date:  02/05/2020  Time:  4:14 AM  Type of Therapy:  Group Therapy  Participation Level:  Did Not Attend    Annitta Needs 02/05/2020, 4:14 AM

## 2020-02-05 NOTE — Plan of Care (Signed)
Pt rates depression 3/10. Pt denies anxiety, SI, HI and AVH. Pt was educated on care plan and verbalizes understanding. Pt was encouraged to attend groups. Collier Bullock RN Problem: Education: Goal: Knowledge of [facility] [facility] Education information/materials will improve Outcome: Progressing Goal: Emotional status will improve Outcome: Progressing Goal: Mental status will improve Outcome: Progressing Goal: Verbalization of understanding the information provided will improve Outcome: Progressing   Problem: Health Behavior/Discharge Planning: Goal: Compliance with treatment plan for underlying cause of condition will improve Outcome: Progressing   Problem: Safety: Goal: Periods of time without injury will increase Outcome: Progressing   Problem: Coping: Goal: Coping ability will improve Outcome: Progressing Goal: Will verbalize feelings Outcome: Progressing   Problem: Self-Concept: Goal: Level of anxiety will decrease Outcome: Progressing   Problem: Education: Goal: Will be free of psychotic symptoms Outcome: Progressing

## 2020-02-05 NOTE — Progress Notes (Signed)
[facility] MD Progress Note  02/05/2020 11:46 AM [name]  MRN:  [id number] Subjective: Follow-up for this patient with schizoaffective disorder.  Patient seen chart reviewed.  Patient says she is feeling better today.  Denies hallucinations.  Denies feelings of paranoia.  She tells me that she has reconsidered and decided she would just go back to her apartment after discharge.  No complaints of any side effects.  Denies suicidal thoughts.  Appears to be thinking much more clearly Principal Problem: Schizoaffective disorder, bipolar type ([place]) Diagnosis: Principal Problem:   Schizoaffective disorder, bipolar type ([place]) Active Problems:   Bipolar disorder, manic ([place])  Total Time spent with patient: 30 minutes  Past Psychiatric History: Past history of chronic recurrent psychotic disorder  Past Medical History:  Past Medical History:  Diagnosis Date  . Anemia   . Anxiety   . Asthma   . Bipolar 1 disorder ([place])   . Breast cancer ([place])   . Depression   . Diabetes mellitus without complication ([place])   . Hypertension   . Insomnia, persistent   . Schizophrenic disorder ([place])   . Seizures ([place])     Past Surgical History:  Procedure Laterality Date  . BREAST SURGERY Left   . TONSILLECTOMY     Family History:  Family History  Problem Relation Age of Onset  . Depression Mother   . Gout Mother   . Cancer Father        prostate  . Other Father        lung issue  . Alcoholism Other   . Heart attack Paternal Grandfather   . Heart attack Paternal Grandmother   . Heart attack Maternal Grandmother   . Heart attack Maternal Grandfather   . Depression Son   . Anxiety disorder Son    Family Psychiatric  History: See previous Social History:  Social History   Substance and Sexual Activity  Alcohol Use Not Currently     Social History   Substance and Sexual Activity  Drug Use No    Social History   Socioeconomic History  . Marital status: Single    Spouse name: Not on file   . Number of children: Not on file  . Years of education: Not on file  . Highest education level: Not on file  Occupational History  . Not on file  Tobacco Use  . Smoking status: Former Smoker    Types: Cigarettes  . Smokeless tobacco: Never Used  Vaping Use  . Vaping Use: Never used  Substance and Sexual Activity  . Alcohol use: Not Currently  . Drug use: No  . Sexual activity: Not Currently  Other Topics Concern  . Not on file  Social History Narrative  . Not on file   Social Determinants of Health   Financial Resource Strain:   . Difficulty of Paying Living Expenses:   Food Insecurity:   . Worried About Charity fundraiser in the Last Year:   . Arboriculturist in the Last Year:   Transportation Needs:   . Film/video editor (Medical):   Marland Kitchen Lack of Transportation (Non-Medical):   Physical Activity:   . Days of Exercise per Week:   . Minutes of Exercise per Session:   Stress:   . Feeling of Stress :   Social Connections:   . Frequency of Communication with Friends and Family:   . Frequency of Social Gatherings with Friends and Family:   . Attends Religious Services:   .  Active Member of Clubs or Organizations:   . Attends Archivist Meetings:   Marland Kitchen Marital Status:    Additional Social History:    Pain Medications: see PTA Prescriptions: see PTA Over the Counter: see PTA History of alcohol / drug use?: No history of alcohol / drug abuse                    Sleep: Fair  Appetite:  Fair  Current Medications: Current Facility-Administered Medications  Medication Dose Route Frequency Provider Last Rate Last Admin  . acetaminophen (TYLENOL) tablet 650 mg  650 mg Oral Q6H PRN Advit Trethewey T, MD      . alum & mag hydroxide-simeth (MAALOX/MYLANTA) 200-200-20 MG/5ML suspension 30 mL  30 mL Oral Q4H PRN Yazlin Ekblad T, MD      . benztropine (COGENTIN) tablet 0.5 mg  0.5 mg Oral QHS Elen Acero T, MD   0.5 mg at 02/04/20 2135  . divalproex  (DEPAKOTE) DR tablet 500 mg  500 mg Oral Q12H Ravina Milner, [name], MD   500 mg at 02/05/20 0805  . hydrOXYzine (ATARAX/VISTARIL) tablet 25 mg  25 mg Oral TID PRN Norabelle Kondo T, MD      . magnesium hydroxide (MILK OF MAGNESIA) suspension 30 mL  30 mL Oral Daily PRN Delmon Andrada T, MD      . paliperidone (INVEGA) 24 hr tablet 6 mg  6 mg Oral QHS Hadley Detloff T, MD   6 mg at 02/04/20 2134  . temazepam (RESTORIL) capsule 15 mg  15 mg Oral QHS PRN Naisha Wisdom, [name], MD   15 mg at 02/03/20 2131    Lab Results: No results found for this or any previous visit (from the past 48 hour(s)).  Blood Alcohol level:  Lab Results  Component Value Date   ETH <10 02/02/2020   ETH <10 82/42/3536    Metabolic Disorder Labs: Lab Results  Component Value Date   HGBA1C 5.0 08/27/2018   MPG 96.8 08/27/2018   MPG 96.8 11/21/2017   Lab Results  Component Value Date   PROLACTIN 74.6 (H) 09/28/2019   PROLACTIN 4.2 (L) 12/19/2018   Lab Results  Component Value Date   CHOL 186 09/28/2019   TRIG 56 09/28/2019   HDL 52 09/28/2019   CHOLHDL 3.6 09/28/2019   VLDL 11 09/28/2019   LDLCALC 123 (H) 09/28/2019   LDLCALC 97 12/19/2018    Physical Findings: AIMS:  , ,  ,  ,    CIWA:    COWS:     Musculoskeletal: Strength & Muscle Tone: within normal limits Gait & Station: normal Patient leans: N/A  Psychiatric Specialty Exam: Physical Exam  Nursing note and vitals reviewed. Constitutional: She appears well-developed.  HENT:  Head: Normocephalic and atraumatic.  Eyes: Pupils are equal, round, and reactive to light. Conjunctivae are normal.  Cardiovascular: Normal heart sounds.  Respiratory: Effort normal.  GI: Soft.  Musculoskeletal:        General: Normal range of motion.     Cervical back: Normal range of motion.  Neurological: She is alert.  Skin: Skin is warm and dry.  Psychiatric: Her speech is normal and behavior is normal. Judgment and thought content normal. Her affect is blunt.     Review of Systems  Constitutional: Negative.   HENT: Negative.   Eyes: Negative.   Respiratory: Negative.   Cardiovascular: Negative.   Gastrointestinal: Negative.   Musculoskeletal: Negative.   Skin: Negative.   Neurological: Negative.  Psychiatric/Behavioral: Negative.     Blood pressure 120/87, pulse 79, temperature 98.7 F (37.1 C), temperature source Oral, resp. rate 18, height 5\' 6"  (1.676 m), weight 103.4 kg, SpO2 100 %.Body mass index is 36.8 kg/m.  General Appearance: Disheveled  Eye Contact:  Fair  Speech:  Slow  Volume:  Decreased  Mood:  Euthymic  Affect:  Constricted  Thought Process:  Coherent  Orientation:  Full (Time, Place, and Person)  Thought Content:  Logical  Suicidal Thoughts:  No  Homicidal Thoughts:  No  Memory:  Immediate;   Fair Recent;   Fair Remote;   Fair  Judgement:  Fair  Insight:  Fair  Psychomotor Activity:  Normal  Concentration:  Concentration: Fair  Recall:  [employer] of Knowledge:  Fair  Language:  Fair  Akathisia:  No  Handed:  Right  AIMS (if indicated):     Assets:  Desire for Improvement Housing Physical Health Resilience Social Support  ADL's:  Intact  Cognition:  WNL  Sleep:  Number of Hours: 8.45     Treatment Plan Summary: Daily contact with patient to assess and evaluate symptoms and progress in treatment, Medication management and Plan We talked about the importance of being compliant with oral medicine.  Some report suggests that the patient stays on her long-acting injectable but rarely takes her oral medicine.  Patient acknowledges this and says that she thinks that she will try to do that.  She seems to be stabilizing coming back pretty much normal without acute dangerousness.  We will probably be looking at discharge in 1 to 2 days.  Alethia Berthold, MD 02/05/2020, 11:46 AM

## 2020-02-05 NOTE — BHH Group Notes (Signed)
[facility] Group Notes:  (Nursing/MHT/Case Management/Adjunct)  Date:  02/05/2020  Time:  9:12 PM  Type of Therapy:  Group Therapy  Participation Level:  Active  Participation Quality:  Appropriate  Affect:  Appropriate  Cognitive:  Alert  Insight:  Good  Engagement in Group:  Engaged and stay positive  Modes of Intervention:  Support  Summary of Progress/Problems:  [name] 02/05/2020, 9:12 PM

## 2020-02-05 NOTE — Progress Notes (Signed)
Recreation Therapy Notes  INPATIENT RECREATION THERAPY ASSESSMENT  Patient Details Name: Tsering Leaman MRN: [id number] DOB: 24-Nov-1970 Today's Date: 02/05/2020       Information Obtained From: Patient  Able to Participate in Assessment/Interview: Yes  Patient Presentation: Responsive  Reason for Admission (Per Patient): Active Symptoms  Patient Stressors:    Coping Skills:   Prayer  Leisure Interests (2+):  Individual - Reading  Frequency of Recreation/Participation: Weekly  Awareness of Community Resources:  Yes  Community Resources:  [employer]  Current Use:    If no, Barriers?:    Expressed Interest in [place] of Residence:  [place]  Patient Main Form of Transportation: Musician  Patient Strengths:  Helpful  Patient Identified Areas of Improvement:  My mental health  Patient Goal for Hospitalization:  Stable housing  Current SI (including self-harm):  No  Current HI:  No  Current AVH: No  Staff Intervention Plan: Group Attendance, Collaborate with Interdisciplinary Treatment Team  Consent to Intern Participation: N/A  Tulani Kidney 02/05/2020, 3:14 PM

## 2020-02-05 NOTE — BHH Counselor (Signed)
CSW spoke with Janette on the patient's ACT team, they request a call at discharge.  She reports that [name] team will follow up with the patient same day of discharge.   Assunta Curtis, MSW, LCSW 02/05/2020 3:02 PM

## 2020-02-06 LAB — VALPROIC ACID LEVEL: Valproic Acid Lvl: 90 ug/mL (ref 50.0–100.0)

## 2020-02-06 MED ORDER — BENZTROPINE MESYLATE 0.5 MG PO TABS: 0.5000 mg | ORAL_TABLET | Freq: Every day | ORAL | 1 refills | Status: DC

## 2020-02-06 MED ORDER — ALBUTEROL SULFATE HFA 108 (90 BASE) MCG/ACT IN AERS: 2.0000 | INHALATION_SPRAY | Freq: Four times a day (QID) | RESPIRATORY_TRACT | 1 refills | Status: DC | PRN

## 2020-02-06 MED ORDER — BUDESONIDE-FORMOTEROL FUMARATE 160-4.5 MCG/ACT IN AERO: 2.0000 | INHALATION_SPRAY | Freq: Two times a day (BID) | RESPIRATORY_TRACT | 0 refills | Status: DC

## 2020-02-06 MED ORDER — INVEGA SUSTENNA 234 MG/1.5ML IM SUSY: 234.0000 mg | PREFILLED_SYRINGE | Freq: Once | INTRAMUSCULAR | 1 refills | Status: DC

## 2020-02-06 MED ORDER — TEMAZEPAM 15 MG PO CAPS: 15.0000 mg | ORAL_CAPSULE | Freq: Every evening | ORAL | 1 refills | Status: DC | PRN

## 2020-02-06 MED ORDER — FLUTICASONE PROPIONATE HFA 44 MCG/ACT IN AERO: 2.0000 | INHALATION_SPRAY | Freq: Two times a day (BID) | RESPIRATORY_TRACT | 12 refills | Status: DC

## 2020-02-06 MED ORDER — PALIPERIDONE ER 6 MG PO TB24: 6.0000 mg | ORAL_TABLET | Freq: Every day | ORAL | 1 refills | Status: DC

## 2020-02-06 MED ORDER — DIVALPROEX SODIUM 500 MG PO DR TAB: 500.0000 mg | DELAYED_RELEASE_TABLET | Freq: Two times a day (BID) | ORAL | 1 refills | Status: DC

## 2020-02-06 NOTE — Progress Notes (Signed)
Recreation Therapy Notes  INPATIENT RECREATION TR PLAN  Patient Details Name: [name] MRN: [id number] DOB: July 26, 1971 Today's Date: 02/06/2020  Rec Therapy Plan Is patient appropriate for Therapeutic Recreation?: Yes Treatment times per week: at least 3 Estimated Length of Stay: 5-7 days TR Treatment/Interventions: Group participation (Comment)  Discharge Criteria Pt will be discharged from therapy if:: Discharged Treatment plan/goals/alternatives discussed and agreed upon by:: Patient/family  Discharge Summary Short term goals set: Patient will engage in groups without prompting or encouragement from LRT x3 group sessions within 5 recreation therapy group sessions Short term goals met: Not met Reason goals not met: Patient did not attend any groups Therapeutic equipment acquired: N/A Reason patient discharged from therapy: Discharge from hospital Pt/family agrees with progress & goals achieved: Yes Date patient discharged from therapy: 02/06/20   [name] 02/06/2020, 3:05 PM

## 2020-02-06 NOTE — Progress Notes (Signed)
Recreation Therapy Notes  Date: 02/06/2020  Time: 9:30 am   Location: Craft room   Behavioral response: N/A   Intervention Topic: Self- Care    Discussion/Intervention: Patient did not attend group.   Clinical Observations/Feedback:  Patient did not attend group.   Mirian Casco LRT/CTRS         Placido Hangartner 02/06/2020 12:05 PM

## 2020-02-06 NOTE — Plan of Care (Signed)
  Problem: Group Participation Goal: STG - Patient will engage in groups without prompting or encouragement from LRT x3 group sessions within 5 recreation therapy group sessions Description: STG - Patient will engage in groups without prompting or encouragement from LRT x3 group sessions within 5 recreation therapy group sessions Outcome: Not Applicable Note: Patient did not attend any groups

## 2020-02-06 NOTE — Progress Notes (Signed)
Patient was provided with discharge summary, Transition packet, and Suicide Risk Assessment. Verbalized discharge summary, transition packet and suicide risk assessment, patient made aware of any change in medications, and all upcoming appointments.   Patient belongings returned. Rx provided to patient.  Patient denied any SI, denies plans for self harm or the harm of others. Patient is calm, with appropriate affect and eye contact. Patient reports no complaints at this time.

## 2020-02-06 NOTE — Plan of Care (Signed)
Adequate for discharge   Problem: Education: Goal: Knowledge of [place] [facility] Education information/materials will improve Outcome: Adequate for Discharge Goal: Emotional status will improve Outcome: Adequate for Discharge Goal: Mental status will improve Outcome: Adequate for Discharge Goal: Verbalization of understanding the information provided will improve Outcome: Adequate for Discharge   Problem: Health Behavior/Discharge Planning: Goal: Compliance with treatment plan for underlying cause of condition will improve Outcome: Adequate for Discharge   Problem: Coping: Goal: Coping ability will improve Outcome: Adequate for Discharge Goal: Will verbalize feelings Outcome: Adequate for Discharge   Problem: Self-Concept: Goal: Level of anxiety will decrease Outcome: Adequate for Discharge   Problem: Education: Goal: Will be free of psychotic symptoms Outcome: Adequate for Discharge

## 2020-02-06 NOTE — Plan of Care (Signed)
Patient isolative to her room but stated she was feeling better to this writer  Problem: Education: Goal: Emotional status will improve Outcome: Progressing Goal: Mental status will improve Outcome: Progressing

## 2020-02-06 NOTE — Progress Notes (Signed)
Patient pleasant during assessment denying SI/HI/AVH, pain and anxiety. Patient endorses depression. Patient compliant with medication administration per MD orders. PT isolative to her room but did come up for medications. Patient given education, support and encouragement to be active in her treatment plan. Patient being monitored Q 15 minutes for safety per unit protocol. Patient remains safe on the unit.

## 2020-02-06 NOTE — Progress Notes (Signed)
  [facility] Adult Case Management Discharge Plan :  Will you be returning to the same living situation after discharge:  Yes,  pt is returning home. At discharge, do you have transportation home?: Yes,  CSW will assist. Do you have the ability to pay for your medications: Yes,  [facility] Medicare.  Release of information consent forms completed and in the chart;  [name] signature needed at discharge.  Patient to Follow up at:  Follow-up Information    [facility] Follow up.   Why: Your [name] team will follow up with you today 02/06/2020.  Thanks! Contact information: [place]  [place] [address] [phone]               Next level of care provider has access to [place] and Suicide Prevention discussed: Yes,  SPE completed with pt's father.  Have you used any form of tobacco in the last 30 days? (Cigarettes, Smokeless Tobacco, Cigars, and/or Pipes): No  Has patient been referred to the Quitline?: Patient refused referral  Patient has been referred for addiction treatment: Pt. refused referral  Rozann Lesches, LCSW 02/06/2020, 11:32 AM

## 2020-02-06 NOTE — Discharge Summary (Signed)
Physician Discharge Summary Note  Patient:  [name] is an 49 y.o., female MRN:  [id number] DOB:  [birth year] Patient phone:  [phone] (home)  Patient address:   [address] [place] [address],  Total Time spent with patient: 30 minutes  Date of Admission:  02/02/2020 Date of Discharge: 02/06/2020  Reason for Admission: Admitted because of paranoid agitated psychotic symptoms in the context of noncompliance  Principal Problem: Schizoaffective disorder, bipolar type [facility]) Discharge Diagnoses: Principal Problem:   Schizoaffective disorder, bipolar type ([place]) Active Problems:   Bipolar disorder, manic ([place])   Past Psychiatric History: Past history of schizoaffective disorder with previous hospitalizations  Past Medical History:  Past Medical History:  Diagnosis Date  . Anemia   . Anxiety   . Asthma   . Bipolar 1 disorder ([place])   . Breast cancer ([place])   . Depression   . Diabetes mellitus without complication ([place])   . Hypertension   . Insomnia, persistent   . Schizophrenic disorder ([place])   . Seizures ([place])     Past Surgical History:  Procedure Laterality Date  . BREAST SURGERY Left   . TONSILLECTOMY     Family History:  Family History  Problem Relation Age of Onset  . Depression Mother   . Gout Mother   . Cancer Father        prostate  . Other Father        lung issue  . Alcoholism Other   . Heart attack Paternal Grandfather   . Heart attack Paternal Grandmother   . Heart attack Maternal Grandmother   . Heart attack Maternal Grandfather   . Depression Son   . Anxiety disorder Son    Family Psychiatric  History: See previous Social History:  Social History   Substance and Sexual Activity  Alcohol Use Not Currently     Social History   Substance and Sexual Activity  Drug Use No    Social History   Socioeconomic History  . Marital status: Single    Spouse name: Not on file  . Number of children: Not on file  . Years of  education: Not on file  . Highest education level: Not on file  Occupational History  . Not on file  Tobacco Use  . Smoking status: Former Smoker    Types: Cigarettes  . Smokeless tobacco: Never Used  Vaping Use  . Vaping Use: Never used  Substance and Sexual Activity  . Alcohol use: Not Currently  . Drug use: No  . Sexual activity: Not Currently  Other Topics Concern  . Not on file  Social History Narrative  . Not on file   Social Determinants of Health   Financial Resource Strain:   . Difficulty of Paying Living Expenses:   Food Insecurity:   . Worried About Charity fundraiser in the Last Year:   . Arboriculturist in the Last Year:   Transportation Needs:   . Film/video editor (Medical):   Marland Kitchen Lack of Transportation (Non-Medical):   Physical Activity:   . Days of Exercise per Week:   . Minutes of Exercise per Session:   Stress:   . Feeling of Stress :   Social Connections:   . Frequency of Communication with Friends and Family:   . Frequency of Social Gatherings with Friends and Family:   . Attends Religious Services:   . Active Member of Clubs or Organizations:   . Attends Archivist Meetings:   .  Marital Status:     Hospital Course: Patient was admitted to the hospital and kept on 15-minute precautions.  She showed no dangerous or aggressive behavior on the unit.  She was cooperative with treatment planning.  Restarted on Depakote at previous dosage of 500 mg twice a day.  Level done today is 90.  Patient also received her injection of long-acting antipsychotic.  Over the time she was in the hospital psychotic symptoms resolved.  No longer agitated paranoid or talking in a bizarre manner.  Able to discuss outpatient treatment appropriately.  No evidence of suicidality.  Discharged back to the care of her act team.  Physical Findings: AIMS:  , ,  ,  ,    CIWA:    COWS:     Musculoskeletal: Strength & Muscle Tone: within normal limits Gait &  Station: normal Patient leans: N/A  Psychiatric Specialty Exam: Physical Exam  Nursing note and vitals reviewed. Constitutional: She appears well-developed.  HENT:  Head: Normocephalic and atraumatic.  Eyes: Pupils are equal, round, and reactive to light. Conjunctivae are normal.  Cardiovascular: Normal heart sounds.  Respiratory: Effort normal.  GI: Soft.  Musculoskeletal:        General: Normal range of motion.     Cervical back: Normal range of motion.  Neurological: She is alert.  Skin: Skin is warm and dry.  Psychiatric: Mood normal.    Review of Systems  Constitutional: Negative.   HENT: Negative.   Eyes: Negative.   Respiratory: Negative.   Cardiovascular: Negative.   Gastrointestinal: Negative.   Musculoskeletal: Negative.   Skin: Negative.   Neurological: Negative.   Psychiatric/Behavioral: Negative.     Blood pressure (!) 124/93, pulse 84, temperature 98 F (36.7 C), temperature source Oral, resp. rate 18, height 5\' 6"  (1.676 m), weight 103.4 kg, SpO2 100 %.Body mass index is 36.8 kg/m.  General Appearance: Casual  Eye Contact:  Good  Speech:  Clear and Coherent  Volume:  Normal  Mood:  Euthymic  Affect:  Congruent  Thought Process:  Goal Directed  Orientation:  Full (Time, Place, and Person)  Thought Content:  Logical  Suicidal Thoughts:  No  Homicidal Thoughts:  No  Memory:  Immediate;   Fair Recent;   Fair Remote;   Fair  Judgement:  Fair  Insight:  Fair  Psychomotor Activity:  Normal  Concentration:  Concentration: Fair  Recall:  [place] of Knowledge:  Fair  Language:  Fair  Akathisia:  No  Handed:  Right  AIMS (if indicated):     Assets:  [place] for Improvement Resilience Social Support  ADL's:  Intact  Cognition:  WNL  Sleep:  Number of Hours: 5.5     Have you used any form of tobacco in the last 30 days? (Cigarettes, Smokeless Tobacco, Cigars, and/or Pipes): No  Has this patient used any form of tobacco in the last 30 days?  (Cigarettes, Smokeless Tobacco, Cigars, and/or Pipes) Yes, No  Blood Alcohol level:  Lab Results  Component Value Date   ETH <10 02/02/2020   ETH <10 75/17/0017    Metabolic Disorder Labs:  Lab Results  Component Value Date   HGBA1C 5.0 08/27/2018   MPG 96.8 08/27/2018   MPG 96.8 11/21/2017   Lab Results  Component Value Date   PROLACTIN 74.6 (H) 09/28/2019   PROLACTIN 4.2 (L) 12/19/2018   Lab Results  Component Value Date   CHOL 186 09/28/2019   TRIG 56 09/28/2019   HDL 52 09/28/2019  CHOLHDL 3.6 09/28/2019   VLDL 11 09/28/2019   LDLCALC 123 (H) 09/28/2019   LDLCALC 97 12/19/2018    See Psychiatric Specialty Exam and Suicide Risk Assessment completed by Attending Physician prior to discharge.  Discharge destination:  Home  Is patient on multiple antipsychotic therapies at discharge:  No   Has Patient had three or more failed trials of antipsychotic monotherapy by history:  No  Recommended Plan for Multiple Antipsychotic Therapies: NA  Discharge Instructions    Diet - low sodium heart healthy   Complete by: As directed    Increase activity slowly   Complete by: As directed      Allergies as of 02/06/2020      Reactions   Haldol [haloperidol Decanoate] Other (See Comments)   Stiffness, eyes bulging   Haloperidol    Penicillins Nausea And Vomiting   Has patient had a PCN reaction causing immediate rash, facial/tongue/throat swelling, SOB or lightheadedness with hypotension:UNSURE  Has patient had a PCN reaction causing severe rash involving mucus membranes or skin necrosis: UNSURE Has patient had a PCN reaction that required hospitalization:UNSURE Has patient had a PCN reaction occurring within the last 10 years:No If all of the above answers are "NO", then may proceed with Cephalosporin use. CHILDHOOD REACTION   Pollen Extract Other (See Comments)   Seasonal allergies   Shrimp [shellfish Allergy] Rash      Medication List    STOP taking these  medications   traZODone 100 MG tablet Commonly known as: DESYREL     TAKE these medications     Indication  albuterol 108 (90 Base) MCG/ACT inhaler Commonly known as: VENTOLIN HFA Inhale 2 puffs into the lungs every 6 (six) hours as needed for wheezing or shortness of breath.  Indication: Asthma   benztropine 0.5 MG tablet Commonly known as: COGENTIN Take 1 tablet (0.5 mg total) by mouth at bedtime. What changed: additional instructions  Indication: Extrapyramidal Reaction caused by Medications   budesonide-formoterol 160-4.5 MCG/ACT inhaler Commonly known as: SYMBICORT Inhale 2 puffs into the lungs 2 (two) times daily. For Shortness of breath  Indication: Asthma   divalproex 500 MG DR tablet Commonly known as: DEPAKOTE Take 1 tablet (500 mg total) by mouth every 12 (twelve) hours. What changed: additional instructions  Indication: Schizoaffective disorder   fluticasone 44 MCG/ACT inhaler Commonly known as: FLOVENT HFA Inhale 2 puffs into the lungs 2 (two) times daily. For SOB  Indication: Chronic Obstructive Lung Disease   Invega Sustenna 234 MG/1.5ML Susy injection Generic drug: paliperidone Inject 234 mg into the muscle once for 1 dose.  Indication: Schizoaffective Disorder   paliperidone 6 MG 24 hr tablet Commonly known as: INVEGA Take 1 tablet (6 mg total) by mouth at bedtime. What changed: additional instructions  Indication: Schizoaffective Disorder   temazepam 15 MG capsule Commonly known as: RESTORIL Take 1 capsule (15 mg total) by mouth at bedtime as needed for sleep.  Indication: Trouble Sleeping       Follow-up Information    [facility] Follow up.   Why: Your [name] team will follow up with you today 02/06/2020.  Thanks! Contact information: [place]  [place] [address] [phone]               Follow-up recommendations:  Activity:  Activity as tolerated Diet:  Regular diet Other:  Encourage patient to be more  consistent with oral medication compliance  Comments: Prescriptions provided at discharge  Signed: Alethia Berthold, MD 02/06/2020, 11:40 AM

## 2020-02-06 NOTE — BHH Suicide Risk Assessment (Signed)
[facility] Discharge Suicide Risk Assessment   Principal Problem: Schizoaffective disorder, bipolar type [facility]) Discharge Diagnoses: Principal Problem:   Schizoaffective disorder, bipolar type ([place]) Active Problems:   Bipolar disorder, manic ([place])   Total Time spent with patient: 30 minutes  Musculoskeletal: Strength & Muscle Tone: within normal limits Gait & Station: normal Patient leans: N/A  Psychiatric Specialty Exam: Review of Systems  Constitutional: Negative.   HENT: Negative.   Eyes: Negative.   Respiratory: Negative.   Cardiovascular: Negative.   Gastrointestinal: Negative.   Musculoskeletal: Negative.   Skin: Negative.   Neurological: Negative.   Psychiatric/Behavioral: Negative.     Blood pressure (!) 124/93, pulse 84, temperature 98 F (36.7 C), temperature source Oral, resp. rate 18, height 5\' 6"  (1.676 m), weight 103.4 kg, SpO2 100 %.Body mass index is 36.8 kg/m.  General Appearance: Casual  Eye Contact::  Good  Speech:  Clear and [id number]  Volume:  Decreased  Mood:  Euthymic  Affect:  Congruent  Thought Process:  Goal Directed  Orientation:  Full (Time, Place, and Person)  Thought Content:  Logical  Suicidal Thoughts:  No  Homicidal Thoughts:  No  Memory:  Immediate;   Fair Recent;   Fair Remote;   Fair  Judgement:  Fair  Insight:  Fair  Psychomotor Activity:  Normal  Concentration:  Fair  Recall:  [employer] of Knowledge:Fair  Language: Fair  Akathisia:  No  Handed:  Right  AIMS (if indicated):     Assets:  [place]ire for Improvement  Sleep:  Number of Hours: 5.5  Cognition: WNL  ADL's:  Intact   Mental Status Per Nursing Assessment::   On Admission:  NA  Demographic Factors:  Living alone  Loss Factors: NA  Historical Factors: NA  Risk Reduction Factors:   Positive social support and Positive therapeutic relationship  Continued Clinical Symptoms:  Schizophrenia:   Paranoid or undifferentiated type  Cognitive Features That  Contribute To Risk:  None    Suicide Risk:  Minimal: No identifiable suicidal ideation.  Patients presenting with no risk factors but with morbid ruminations; may be classified as minimal risk based on the severity of the depressive symptoms   Follow-up Information    [facility] Follow up.   Contact information: [address]  [place] [address] [phone]               Plan Of Care/Follow-up recommendations:  Activity:  Activity as tolerated Diet:  Regular diet Other:  Follow-up with outpatient treatment in [place] continue with act team.  Continue with usual medicine.  Make an effort to stay on oral medicine regularly.  Alethia Berthold, MD 02/06/2020, 9:30 AM

## 2020-02-10 ENCOUNTER — Ambulatory Visit (HOSPITAL_COMMUNITY)
Admission: EM | Admit: 2020-02-10 | Discharge: 2020-02-10 | Disposition: A | Discharge status: Home / Self Care | Payer: 59 | Attending: Psychiatry | Admitting: Psychiatry

## 2020-02-10 ENCOUNTER — Other Ambulatory Visit: Payer: Self-pay

## 2020-02-10 DIAGNOSIS — F209 Schizophrenia, unspecified: Secondary | ICD-10-CM | POA: Diagnosis not present

## 2020-02-10 DIAGNOSIS — R45851 Suicidal ideations: Secondary | ICD-10-CM | POA: Diagnosis present

## 2020-02-10 DIAGNOSIS — I10 Essential (primary) hypertension: Secondary | ICD-10-CM | POA: Diagnosis not present

## 2020-02-10 DIAGNOSIS — G4700 Insomnia, unspecified: Secondary | ICD-10-CM | POA: Insufficient documentation

## 2020-02-10 DIAGNOSIS — F419 Anxiety disorder, unspecified: Secondary | ICD-10-CM | POA: Diagnosis not present

## 2020-02-10 DIAGNOSIS — F319 Bipolar disorder, unspecified: Secondary | ICD-10-CM | POA: Insufficient documentation

## 2020-02-10 DIAGNOSIS — E119 Type 2 diabetes mellitus without complications: Secondary | ICD-10-CM | POA: Insufficient documentation

## 2020-02-10 DIAGNOSIS — F250 Schizoaffective disorder, bipolar type: Secondary | ICD-10-CM

## 2020-02-10 DIAGNOSIS — R569 Unspecified convulsions: Secondary | ICD-10-CM | POA: Diagnosis not present

## 2020-02-10 NOTE — ED Provider Notes (Signed)
Behavioral Health Medical Screening Exam  [name] is a 49 y.o. female with past history of HTN, DM, Shizophrenia, Bipolar disorder, Anxiety, Insomnia, and Seizure presented to the crisis center with suicidal ideation without a plan. Patient states that she is frustrated with what's going on in her life and wants to end it all. Pt was recently discharged from inpatient unit on 02/06/2020 at [facility] for suicidal ideations. Patient reports that she is worried about her kids involved with drugs and getting murdered. Pt reports having depressed and irritable mood. Pt denies low energy, decrease in appetite, homicidal thoughts, loss of concentration, increased self esteem, flight of ideas, increased activity and impulsiveness. Pt states that she wakes up in the middle of night sometimes. Pt denies having suicidal ideation later on and said she wanted to talk to someone and wants only outpatient treatment as inpatient treatment was not helpful.  [name] [name] services was contacted and reported that they would have someone to visit her today. They had no safety concerns for the patient discharging. Nursing staff provided safe transport to the patient back to her apartment. Patient had continued to deny any suicidal or homicidal ideations and deny any hallucinations.   Total Time spent with patient: 45 minutes  Psychiatric Specialty Exam  Presentation  General Appearance: No data recorded Eye Contact:Poor  Speech:Clear and Coherent;Other (comment) (Lot ofTangential talk, goes off topic frequently)  Speech Volume:Increased  Handedness:No data recorded  Mood and Affect  Mood:Hopeless;Irritable;Depressed  Affect:Tearful;Restricted   Thought Process  Thought Processes:Irrevelant  Descriptions of Associations:Tangential  Orientation:Full (Time, Place and Person)  Thought Content:Paranoid Ideation;Tangential  Hallucinations:Hallucinations: None  Ideas of Reference:Paranoia  Suicidal  Thoughts:Suicidal Thoughts: Yes, Passive (Pt initialy states having suicidal thoughts with no plan but later on denies having suicidal thoughts and said she needed someone to talk to and good to go) SI Passive Intent and/or Plan: Without Plan  Homicidal Thoughts:No data recorded  Sensorium  Memory:Immediate Good;Remote Good  Judgment:Good  Insight:Good   Executive Functions  Concentration:Poor  Attention Span:Poor  [place]  Language:Good   Psychomotor Activity  Psychomotor Activity:Psychomotor Activity: Normal   Assets  Assets:Housing   Sleep  Sleep:Sleep: Fair (wakes up in the middle of night sometimes)   Physical Exam: Physical Exam Vitals and nursing note reviewed.  Constitutional:      General: She is not in acute distress.    Appearance: She is well-developed.  Eyes:     Conjunctiva/sclera: Conjunctivae normal.  Cardiovascular:     Rate and Rhythm: Normal rate.     Heart sounds: No murmur heard.   Pulmonary:     Effort: Pulmonary effort is normal. No respiratory distress.  Abdominal:     Palpations: Abdomen is soft.     Tenderness: There is no abdominal tenderness.  Musculoskeletal:     Cervical back: Normal range of motion.  Skin:    General: Skin is warm and dry.  Neurological:     Mental Status: She is alert.    Review of Systems  Constitutional: Negative.   HENT: Negative.   Eyes: Negative.   Respiratory: Negative.   Cardiovascular: Negative.   Gastrointestinal: Negative.   Genitourinary: Negative.   Musculoskeletal: Negative.   Skin: Negative.   Neurological: Negative.   Endo/Heme/Allergies: Negative.   Psychiatric/Behavioral: Positive for depression. The patient is nervous/anxious.    Blood pressure 120/84, pulse 89, temperature 98.6 F (37 C), resp. rate 18, height 5' 6.14" (1.68 m), weight 234 lb (106.1 kg), SpO2  100 %. Body mass index is 37.61 kg/m.  Musculoskeletal: Strength & Muscle Tone:  within normal limits Gait & Station: normal Patient leans: N/A   Recommendations:  Based on my evaluation the patient does not appear to have an emergency medical condition.  [place], FNP 02/10/2020, 12:17 PM

## 2020-02-10 NOTE — ED Notes (Signed)
Patient Belongings in locker 25.

## 2020-02-10 NOTE — ED Notes (Addendum)
Nursing Discharge Note:  D:Patient denies SI/HI/AVH at this time. Pt appears calm and cooperative, and no distress noted.  A: All Personal items in locker returned to pt. Pt given AVS. Pt escorted to the lobby to wait for ride home via safe transport.  R:  Pt States she will comply with follow-up with ACT Team/[facility].  ACT team notified by provider.

## 2020-02-10 NOTE — Discharge Instructions (Signed)
Continue current home medications Follow up with [name] services today

## 2020-02-10 NOTE — BH Assessment (Signed)
Comprehensive Clinical Assessment (CCA) Screening, Triage and Referral Note  02/10/2020 [name] [id number]   Patient is a 49 y.o.female with a history of Schizoaffective Disorder who presented via EMS reporting SI, without a plan.  She has expressed frustration with multiple stressors and became tearful while discussing various stressors.  Patient is currently denying SI and states her symptoms would likely worsen if she were admitted for inpatient treatment.   Patient was discharged from [facility] on 02/06/20.  Patient received an [place] injection while at [facility].  She is followed by [facility] for medication management and she plans to continue her [name] services with them.  She states she lost Jeanette's number and would prefer to see her.    Per Marvia Pickles, NP patient does not meet criteria for inpatient treatment.  The recommendation is that patient follow up with [facility] for continued [name] services.  Patient is in agreement with this plan.    Visit Diagnosis:    ICD-10-CM   1. Schizoaffective disorder, bipolar type ([place])  F25.0     Patient Reported Information How did you hear about [place]? Self   Referral name: N/A   Referral phone number: No data recorded Whom do you see for routine medical problems? No data recorded  Practice/Facility Name: No data recorded  Practice/Facility Phone Number: No data recorded  Name of Contact: No data recorded  Contact Number: No data recorded  Contact Fax Number: No data recorded  Prescriber Name: No data recorded  Prescriber Address (if known): No data recorded What Is the Reason for Your Visit/Call Today? No data recorded How Long Has This Been Causing You Problems? No data recorded Have You Recently Been in Any Inpatient Treatment (Hospital/Detox/Crisis Center/28-Day Program)? Yes   Name/Location of Program/Hospital:[facility]   How Long Were You There? 5 days   When Were You Discharged? No data recorded Have You Ever Received Services  From [facility] Before? Yes   Who Do You See at [facility]? Inpatient admissions  Have You Recently Had Any Thoughts About Hurting Yourself? Yes (Pt reports having suicidal thoughts just prior to arrival to [facility].  She denies current SI.)   Are You Planning to Commit Suicide/Harm Yourself At This time?  No  Have you Recently Had Thoughts About [place]? No   Explanation: No data recorded Have You Used Any Alcohol or Drugs in the Past 24 Hours? No   How Long Ago Did You Use Drugs or Alcohol?  No data recorded  What Did You Use and How Much? No data recorded What Do You Feel Would Help You the Most Today? No data recorded Do You Currently Have a Therapist/Psychiatrist? Yes   Name of Therapist/Psychiatrist: [facility]   Have You Been Recently Discharged From Any Office Practice or Programs? No   Explanation of Discharge From Practice/Program:  No data recorded    CCA Screening Triage Referral Assessment Type of Contact: Face-to-Face   Is this Initial or Reassessment? No data recorded  Date Telepsych consult ordered in [facility]:  02/02/20   Time Telepsych consult ordered in [facility]:  0309  Patient Reported Information Reviewed? Yes   Patient Left Without Being Seen? No data recorded  Reason for Not Completing Assessment: Clinician called TTS cart within 5 minutes however no one answered. TTS to try back.  Collateral Involvement: No collateral obtained  Does Patient Have a Stage manager Guardian? No data recorded  Name and Contact of Legal Guardian:  Self  If Minor and  Not Living with Parent(s), Who has Custody? N/A  Is CPS involved or ever been involved? Never  Is APS involved or ever been involved? Never  Patient Determined To Be At Risk for Harm To Self or Others Based on Review of Patient Reported Information or Presenting Complaint? No   Method: No data recorded  Availability of Means: No data recorded  Intent: No data recorded  Notification Required:  No data recorded  Additional Information for Danger to Others Potential:  No data recorded  Additional Comments for Danger to Others Potential:  No data recorded  Are There Guns or Other Weapons in Your Home?  No    Types of Guns/Weapons: No data recorded   Are These Weapons Safely Secured?                              No data recorded   Who Could Verify You Are Able To Have These Secured:    No data recorded Do You Have any Outstanding Charges, Pending Court Dates, Parole/Probation? No data recorded Contacted To Inform of Risk of Harm To Self or Others: Other: Comment (N/A)  Location of Assessment: [facility]  Does Patient Present under Involuntary Commitment? No   IVC Papers Initial File Date: No data recorded  [place] of Residence: [place]  Patient Currently Receiving the Following Services: [name] Architect)   Determination of Need: Urgent (48 hours)   Options For Referral: Other: Comment (Pt will follow up with current provider, Beverly Sessions.)  Disposition:  Per Marvia Pickles, NP patient does not meet criteria for inpatient treatment.  The recommendation is that patient follow up with [facility] for continued [name] services.  Patient is in agreement with this plan.    Fransico Meadow

## 2020-02-10 NOTE — ED Notes (Signed)
Nurse Notified of patient acute pain.

## 2020-02-10 NOTE — ED Notes (Signed)
Patient was brought to facility via EMS per report patient was at bus stop sleeping and complaining of family issues and talking about satan and church  bystander called 911. Patient stated to EMS that she was having a behavioral health crisis. Patient has a hx of Schizoaffective Disorder Bipolar Type. Patient is alert, oriented and ambulatory on arrival. Patient states she is having SI thoughts without plan or intent. Patient denies HI. Patient states her stressor is family issues. Patient states she takes monthly  Injections and has been taking her medication as scheduled. Patient complains of generalized pain rated 5/10. Patient reassured and waiting in assessment room at this time.

## 2020-02-21 ENCOUNTER — Other Ambulatory Visit: Payer: Self-pay | Admitting: Psychiatry

## 2020-02-24 ENCOUNTER — Encounter (HOSPITAL_COMMUNITY): Payer: Self-pay

## 2020-02-24 ENCOUNTER — Emergency Department (HOSPITAL_BASED_OUTPATIENT_CLINIC_OR_DEPARTMENT_OTHER): Payer: 59

## 2020-02-24 ENCOUNTER — Emergency Department (HOSPITAL_COMMUNITY)
Admission: EM | Admit: 2020-02-24 | Discharge: 2020-02-24 | Disposition: A | Discharge status: Home / Self Care | Payer: 59 | Attending: Emergency Medicine | Admitting: Emergency Medicine

## 2020-02-24 ENCOUNTER — Emergency Department (HOSPITAL_COMMUNITY): Payer: 59

## 2020-02-24 ENCOUNTER — Other Ambulatory Visit: Payer: Self-pay

## 2020-02-24 DIAGNOSIS — R072 Precordial pain: Secondary | ICD-10-CM

## 2020-02-24 DIAGNOSIS — R609 Edema, unspecified: Secondary | ICD-10-CM | POA: Diagnosis not present

## 2020-02-24 DIAGNOSIS — Z853 Personal history of malignant neoplasm of breast: Secondary | ICD-10-CM | POA: Insufficient documentation

## 2020-02-24 DIAGNOSIS — Z87891 Personal history of nicotine dependence: Secondary | ICD-10-CM | POA: Diagnosis not present

## 2020-02-24 DIAGNOSIS — E119 Type 2 diabetes mellitus without complications: Secondary | ICD-10-CM | POA: Insufficient documentation

## 2020-02-24 DIAGNOSIS — J45909 Unspecified asthma, uncomplicated: Secondary | ICD-10-CM | POA: Diagnosis not present

## 2020-02-24 DIAGNOSIS — F250 Schizoaffective disorder, bipolar type: Secondary | ICD-10-CM | POA: Diagnosis not present

## 2020-02-24 DIAGNOSIS — F410 Panic disorder [episodic paroxysmal anxiety] without agoraphobia: Secondary | ICD-10-CM | POA: Diagnosis not present

## 2020-02-24 DIAGNOSIS — R079 Chest pain, unspecified: Secondary | ICD-10-CM | POA: Diagnosis present

## 2020-02-24 LAB — CBC
HCT: 35.4 % — ABNORMAL LOW (ref 36.0–46.0)
Hemoglobin: 11.6 g/dL — ABNORMAL LOW (ref 12.0–15.0)
MCH: 30.4 pg (ref 26.0–34.0)
MCHC: 32.8 g/dL (ref 30.0–36.0)
MCV: 92.7 fL (ref 80.0–100.0)
Platelets: 243 10*3/uL (ref 150–400)
RBC: 3.82 MIL/uL — ABNORMAL LOW (ref 3.87–5.11)
RDW: 12.7 % (ref 11.5–15.5)
WBC: 5.5 10*3/uL (ref 4.0–10.5)
nRBC: 0 % (ref 0.0–0.2)

## 2020-02-24 LAB — COMPREHENSIVE METABOLIC PANEL
ALT: 15 U/L (ref 0–44)
AST: 14 U/L — ABNORMAL LOW (ref 15–41)
Albumin: 4.3 g/dL (ref 3.5–5.0)
Alkaline Phosphatase: 60 U/L (ref 38–126)
Anion gap: 10 (ref 5–15)
BUN: 11 mg/dL (ref 6–20)
CO2: 22 mmol/L (ref 22–32)
Calcium: 8.9 mg/dL (ref 8.9–10.3)
Chloride: 105 mmol/L (ref 98–111)
Creatinine, Ser: 0.69 mg/dL (ref 0.44–1.00)
GFR calc Af Amer: >60 mL/min (ref >60)
GFR calc non Af Amer: >60 mL/min (ref >60)
Glucose, Bld: 110 mg/dL — ABNORMAL HIGH (ref 70–99)
Potassium: 3.7 mmol/L (ref 3.5–5.1)
Sodium: 137 mmol/L (ref 135–145)
Total Bilirubin: 0.6 mg/dL (ref 0.3–1.2)
Total Protein: 7.9 g/dL (ref 6.5–8.1)

## 2020-02-24 LAB — TROPONIN I (HIGH SENSITIVITY)
Troponin I (High Sensitivity): <2 ng/L (ref <18)
Troponin I (High Sensitivity): 3 ng/L (ref <18)

## 2020-02-24 LAB — ETHANOL: Alcohol, Ethyl (B): <10 mg/dL (ref <10)

## 2020-02-24 LAB — I-STAT BETA HCG BLOOD, ED (MC, WL, AP ONLY): I-stat hCG, quantitative: <5 m[IU]/mL (ref <5)

## 2020-02-24 LAB — SALICYLATE LEVEL: Salicylate Lvl: <7 mg/dL — ABNORMAL LOW (ref 7.0–30.0)

## 2020-02-24 LAB — ACETAMINOPHEN LEVEL: Acetaminophen (Tylenol), Serum: <10 ug/mL — ABNORMAL LOW (ref 10–30)

## 2020-02-24 LAB — D-DIMER, QUANTITATIVE: D-Dimer, Quant: 0.36 ug/mL-FEU (ref 0.00–0.50)

## 2020-02-24 MED ORDER — HYDROXYZINE HCL 25 MG PO TABS
25.0000 mg | ORAL_TABLET | Freq: Four times a day (QID) | ORAL | 0 refills | Status: DC
Start: 2020-02-24 — End: 2020-09-16

## 2020-02-24 MED ORDER — LORAZEPAM 1 MG PO TABS
1.0000 mg | ORAL_TABLET | Freq: Once | ORAL | Status: AC
  Administered 2020-02-24: 1 mg via ORAL
  Filled 2020-02-24: qty 1

## 2020-02-24 NOTE — Progress Notes (Signed)
Right lower extremity venous duplex has been completed. Preliminary results can be found in CV Proc through chart review.  Results were given to [facility].  02/24/20 8:28 AM [name] RVT

## 2020-02-24 NOTE — ED Triage Notes (Addendum)
Pt states she is having an anxiety attack. Hx schizophrenia

## 2020-02-24 NOTE — ED Provider Notes (Signed)
[facility] DEPT Provider Note   CSN: [id number] Arrival date & time: 02/24/20  0319     History Chief Complaint  Patient presents with   Anxiety    [name] is a 49 y.o. female with past medical history significant for depression, bipolar, schizophrenia, seizures, anxiety who presents for evaluation of possible panic attack.  Patient states yesterday she noted she was having some palpitations.  Felt a "tingling" in her chest.  Patient feels like she has some pain to her right calf.  States she has history of anxiety however denies any prior anxiety attacks.  Patient states her depression has been improved since her last admission to [facility].  She denies any SI, HI, AVH.  Patient states she has been sleeping well at home.  She has been able to hold a job.  She is taking her psychiatric medicines as prescribed.  She denies any recent surgery, mobilization, malignancy.  Denies any swelling, redness or warmth to her extremities.  No recent injury or trauma.  Patient denies any exertional or pleuritic chest pain.  Pain does not radiate, no associated diaphoresis, nausea, vomiting, abdominal pain, back pain.  Patient states when she did have the pain earlier today she felt a "tingling all over."  Patient states this resolved after approximately 5 minutes.  She has no current pain.  Denies additional aggravating or relieving factors. Patient states that she did do a lot of walking yesterday and she is unsure if her leg pain is related to her walking yesterday. No hx of PE, DVT.  History obtained from patient and past medical records. No interpreter used.  HPI     Past Medical History:  Diagnosis Date   Anemia    Anxiety    Asthma    Bipolar 1 disorder ([place])    Breast cancer ([place])    Depression    Diabetes mellitus without complication ([place])    Hypertension    Insomnia, persistent    Schizophrenic disorder ([place])    Seizures ([place])     Patient  Active Problem List   Diagnosis Date Noted   Bipolar disorder, manic ([place]) 02/02/2020   Encounter for medical clearance for patient hold    Noncompliance with treatment 07/16/2015   Schizoaffective disorder, bipolar type ([place]) 01/11/2015    Past Surgical History:  Procedure Laterality Date   BREAST SURGERY Left    TONSILLECTOMY       OB History    Gravida  2   Para  2   Term  2   Preterm      AB      Living  2     SAB      TAB      Ectopic      Multiple      Live Births  2           Family History  Problem Relation Age of Onset   Depression Mother    Gout Mother    Cancer Father        prostate   Other Father        lung issue   Alcoholism Other    Heart attack Paternal Grandfather    Heart attack Paternal Grandmother    Heart attack Maternal Grandmother    Heart attack Maternal Grandfather    Depression Son    Anxiety disorder Son     Social History   Tobacco Use   Smoking status: Former Smoker  Types: Cigarettes   Smokeless tobacco: Never Used  Vaping Use   Vaping Use: Never used  Substance Use Topics   Alcohol use: Not Currently   Drug use: No    Home Medications Prior to Admission medications   Medication Sig Start Date End Date Taking? Authorizing Provider  albuterol (VENTOLIN HFA) 108 (90 Base) MCG/ACT inhaler Inhale 2 puffs into the lungs every 6 (six) hours as needed for wheezing or shortness of breath. 02/06/20  Yes Clapacs, [name], MD  benztropine (COGENTIN) 0.5 MG tablet Take 1 tablet (0.5 mg total) by mouth at bedtime. 02/06/20  Yes Clapacs, [name], MD  budesonide-formoterol (SYMBICORT) 160-4.5 MCG/ACT inhaler Inhale 2 puffs into the lungs 2 (two) times daily. For Shortness of breath 02/06/20  Yes Clapacs, [name], MD  divalproex (DEPAKOTE) 500 MG DR tablet Take 1 tablet (500 mg total) by mouth every 12 (twelve) hours. 02/06/20  Yes Clapacs, [name], MD  fluticasone (FLOVENT HFA) 44 MCG/ACT inhaler Inhale 2  puffs into the lungs 2 (two) times daily. For SOB 02/06/20  Yes Clapacs, [name], MD  paliperidone (INVEGA SUSTENNA) 234 MG/1.5ML SUSY injection Inject 234 mg into the muscle once for 1 dose. 02/06/20 02/24/20 Yes Clapacs, [name], MD  paliperidone (INVEGA) 6 MG 24 hr tablet Take 1 tablet (6 mg total) by mouth at bedtime. 02/06/20  Yes Clapacs, [name], MD  temazepam (RESTORIL) 15 MG capsule Take 1 capsule (15 mg total) by mouth at bedtime as needed for sleep. 02/06/20  Yes Clapacs, [name], MD  traZODone (DESYREL) 100 MG tablet Take 100 mg by mouth at bedtime. 02/19/20  Yes [provider]  hydrOXYzine (ATARAX/VISTARIL) 25 MG tablet Take 1 tablet (25 mg total) by mouth every 6 (six) hours. 02/24/20   Daegon Deiss A, PA-C  ferrous sulfate 325 (65 FE) MG tablet Take 1 tablet (325 mg total) by mouth daily with breakfast. Patient not taking: Reported on 04/20/2019 12/24/18 04/20/19  [name], MD    Allergies    Haldol [haloperidol decanoate], Haloperidol, Penicillins, Pollen extract, and Shrimp [shellfish allergy]  Review of Systems   Review of Systems  Constitutional: Negative.   HENT: Negative.   Respiratory: Negative.   Cardiovascular: Positive for chest pain and palpitations. Negative for leg swelling.  Gastrointestinal: Negative.   Genitourinary: Negative.   Musculoskeletal: Negative for back pain, gait problem, joint swelling, neck pain and neck stiffness.       Right calf pain  Skin: Negative.   Neurological: Negative.   Psychiatric/Behavioral: Negative for agitation, behavioral problems, confusion, hallucinations, self-injury, sleep disturbance and suicidal ideas. The patient is nervous/anxious. The patient is not hyperactive.   All other systems reviewed and are negative.   Physical Exam Updated Vital Signs BP 117/89 (BP Location: Left Arm)    Pulse 62    Temp 98.5 F (36.9 C) (Oral)    Resp 15    Ht 5\' 6"  (1.676 m)    Wt 106 kg    SpO2 100%    BMI 37.72 kg/m   Physical  Exam Vitals and nursing note reviewed.  Constitutional:      General: She is not in acute distress.    Appearance: She is well-developed. She is not ill-appearing, toxic-appearing or diaphoretic.  HENT:     Head: Normocephalic and atraumatic.     Jaw: There is normal jaw occlusion.  Eyes:     Pupils: Pupils are equal, round, and reactive to light.  Neck:  Vascular: No carotid bruit or JVD.     Trachea: Trachea and phonation normal.  Cardiovascular:     Rate and Rhythm: Normal rate.     Pulses: Normal pulses.          Radial pulses are 2+ on the right side and 2+ on the left side.       Dorsalis pedis pulses are 2+ on the right side and 2+ on the left side.       Posterior tibial pulses are 2+ on the right side and 2+ on the left side.     Heart sounds: Normal heart sounds.  Pulmonary:     Effort: Pulmonary effort is normal. No respiratory distress.     Breath sounds: Normal breath sounds and air entry.  Abdominal:     General: Bowel sounds are normal. There is no distension.     Tenderness: There is no abdominal tenderness. There is no right CVA tenderness, left CVA tenderness, guarding or rebound.  Musculoskeletal:        General: Tenderness present. No swelling, deformity or signs of injury. Normal range of motion.     Cervical back: Full passive range of motion without pain, normal range of motion and neck supple.     Right upper leg: Normal.     Left upper leg: Normal.     Right knee: Normal.     Left knee: Normal.     Right lower leg: No edema.     Left lower leg: Normal. No edema.     Right ankle: Normal.     Left ankle: Normal.     Right foot: Normal.     Left foot: Normal.       Legs:     Comments: No bony tenderness.  Compartments soft.  Very minimal tenderness to right distal posterior calf over Achilles tendon.  She has negative [place] Silos test and Bevelyn Buckles' sign.  Skin:    General: Skin is warm and dry.     Capillary Refill: Capillary refill takes less than 2  seconds.  Neurological:     General: No focal deficit present.     Mental Status: She is alert.     Cranial Nerves: Cranial nerves are intact.     Sensory: Sensation is intact.     Motor: Motor function is intact.     Coordination: Coordination is intact.     Gait: Gait is intact.     Comments: CN 2-12 grossly intact. negative finger to nose, heel to shin, romberg.  No facial droop.  5/5 strength to bilateral upper and lower extremities.  Intact sensation bilateral upper and lower extremities.     ED Results / Procedures / Treatments   Labs (all labs ordered are listed, but only abnormal results are displayed) Labs Reviewed  CBC - Abnormal; Notable for the following components:      Result Value   RBC 3.82 (*)    Hemoglobin 11.6 (*)    HCT 35.4 (*)    All other components within normal limits  COMPREHENSIVE METABOLIC PANEL - Abnormal; Notable for the following components:   Glucose, Bld 110 (*)    AST 14 (*)    All other components within normal limits  ACETAMINOPHEN LEVEL - Abnormal; Notable for the following components:   Acetaminophen (Tylenol), Serum <10 (*)    All other components within normal limits  SALICYLATE LEVEL - Abnormal; Notable for the following components:   Salicylate Lvl <7.5 (*)  All other components within normal limits  D-DIMER, QUANTITATIVE (NOT AT [name])  ETHANOL  I-STAT BETA HCG BLOOD, ED ([facility], [facility], [facility] ONLY)  TROPONIN I (HIGH SENSITIVITY)  TROPONIN I (HIGH SENSITIVITY)    EKG EKG Interpretation  Date/Time:  Tuesday February 24 2020 03:40:46 EDT Ventricular Rate:  85 PR Interval:    QRS Duration: 87 QT Interval:  380 QTC Calculation: 452 R Axis:   33 Text Interpretation: Sinus rhythm 12 Lead; Mason-Likar No significant change since 4/20 Confirmed by Aletta Edouard [phone]) on 02/24/2020 6:57:02 AM   Radiology DG Chest 2 View  Result Date: 02/24/2020 CLINICAL DATA:  Chest pain.  Anxiety EXAM: CHEST - 2 VIEW COMPARISON:  03/01/2018 FINDINGS:  Prominent heart size likely accentuated by low volumes. There is no edema, consolidation, effusion, or pneumothorax. Normal aortic and hilar contours. IMPRESSION: Borderline heart size. Clear lungs. Electronically Signed   By: Monte Fantasia M.D.   On: 02/24/2020 07:34   VAS [place] LOWER EXTREMITY VENOUS (DVT) (ONLY [facility] & [facility])  Result Date: 02/24/2020  Lower Venous DVTStudy Indications: Edema.  Risk Factors: None identified. Comparison Study: No prior studies. Performing Technologist: Oliver Hum RVT  Examination Guidelines: A complete evaluation includes B-mode imaging, spectral Doppler, color Doppler, and power Doppler as needed of all accessible portions of each vessel. Bilateral testing is considered an integral part of a complete examination. Limited examinations for reoccurring indications may be performed as noted. The reflux portion of the exam is performed with the patient in reverse Trendelenburg.  +---------+---------------+---------+-----------+----------+--------------+  RIGHT     Compressibility Phasicity Spontaneity Properties Thrombus Aging  +---------+---------------+---------+-----------+----------+--------------+  CFV       Full            Yes       Yes                                    +---------+---------------+---------+-----------+----------+--------------+  SFJ       Full                                                             +---------+---------------+---------+-----------+----------+--------------+  FV Prox   Full                                                             +---------+---------------+---------+-----------+----------+--------------+  FV Mid    Full                                                             +---------+---------------+---------+-----------+----------+--------------+  FV Distal Full                                                             +---------+---------------+---------+-----------+----------+--------------+  PFV       Full                                                              +---------+---------------+---------+-----------+----------+--------------+  POP       Full            Yes       Yes                                    +---------+---------------+---------+-----------+----------+--------------+  PTV       Full                                                             +---------+---------------+---------+-----------+----------+--------------+  PERO      Full                                                             +---------+---------------+---------+-----------+----------+--------------+   +----+---------------+---------+-----------+----------+--------------+  LEFT Compressibility Phasicity Spontaneity Properties Thrombus Aging  +----+---------------+---------+-----------+----------+--------------+  CFV  Full            Yes       Yes                                    +----+---------------+---------+-----------+----------+--------------+     Summary: RIGHT: - There is no evidence of deep vein thrombosis in the lower extremity.  - No cystic structure found in the popliteal fossa.  LEFT: - No evidence of common femoral vein obstruction.  *See table(s) above for measurements and observations. Electronically signed by Deitra Mayo MD on 02/24/2020 at 8:55:13 AM.    Final     Procedures Procedures (including critical care time)  Medications Ordered in ED Medications  LORazepam (ATIVAN) tablet 1 mg (1 mg Oral Given 02/24/20 4166)    ED Course  I have reviewed the triage vital signs and the nursing notes.  Pertinent labs & imaging results that were available during my care of the patient were reviewed by me and considered in my medical decision making (see chart for details).  49 year old female presents for evaluation of possible anxiety.  Afebrile, nonseptic, non-ill-appearing.  Does have longstanding history of schizoaffective disorder.  Patient states her depression is actually improved since her last admission.  She is  no longer having AVH.  Denies SI, HI.  States she does do some avoiding of her generalized activities.  Apparently had some palpitations and about 5 minutes of chest pain yesterday.  She did do a lot of walking and has had some right posterior calf pain.  She has no history of PE or DVT.  States she feels like she was hyperventilating and had "tingling all over."  She has a nonfocal neuro exam without deficits.  Has  history of anxiety however denies prior panic attacks.  Given patient complained of palpitations, chest pain, unilateral leg pain will rule out cardiac cause.  She does appear anxious currently however has stable vital signs without tachycardia, tachypnea or hypoxia.  Labs, imaging and reassess  Labs and imaging personally reviewed and interpreted:  Ultrasound negative for DVT Chest x-ray without infiltrates, cardiomegaly, edema, pneumothorax EKG without ischemia Regnancy test negative Ethanol, acetaminophen, salicylate level negative Delta troponin flat D-dimer 0.36 CBC without leukocytosis, hemoglobin 12.7 Metabolic panel with mild hyperglycemia to 110 however noted electrolyte, renal abnormality   Patient reassessed.  Sleeping soundly.  She continues to deny any chest pain, shortness of breath.  She denied any tachycardia, tachypnea or hypoxia here in the emergency department.  Reassuring labs.  Question patient symptoms likely related to anxiety attack.  I will suspicion for ACS, PE, dissection, pneumothorax, bacterial infectious process.  She does not appear to be fluid overloaded.  She was given Ativan here in the ED which significantly helped her symptoms.  She has repeatedly denied SI, HI, AVH.  Will DC home with hydroxyzine.  Suspect patient's leg pain due to increased walking is patient on reevaluation now states she has bilateral aching and has actually been present times weeks.  Patient is to be discharged with recommendation to follow up with PCP in regards to today's  hospital visit. Chest pain is not likely of cardiac or pulmonary etiology d/t presentation, PERC negative, VSS, no tracheal deviation, no JVD or new murmur, RRR, breath sounds equal bilaterally, EKG without acute abnormalities, negative troponin, and negative CXR. Pt has been advised to return to the ED if CP becomes exertional, associated with diaphoresis or nausea, radiates to left jaw/arm, worsens or becomes concerning in any way. Pt appears reliable for follow up and is agreeable to discharge.   The patient has been appropriately medically screened and/or stabilized in the ED. I have low suspicion for any other emergent medical condition which would require further screening, evaluation or treatment in the ED or require inpatient management.  Patient is hemodynamically stable and in no acute distress.  Patient able to ambulate in department prior to ED.  Evaluation does not show acute pathology that would require ongoing or additional emergent interventions while in the emergency department or further inpatient treatment.  I have discussed the diagnosis with the patient and answered all questions.  Pain is been managed while in the emergency department and patient has no further complaints prior to discharge.  Patient is comfortable with plan discussed in room and is stable for discharge at this time.  I have discussed strict return precautions for returning to the emergency department.  Patient was encouraged to follow-up with PCP/specialist refer to at discharge.     MDM Rules/Calculators/A&P                           Final Clinical Impression(s) / ED Diagnoses Final diagnoses:  Panic attack  Precordial pain    Rx / DC Orders ED Discharge Orders         Ordered    hydrOXYzine (ATARAX/VISTARIL) 25 MG tablet  Every 6 hours     Discontinue  Reprint     02/24/20 1036           Rehana Uncapher A, PA-C 02/24/20 1040    Hayden Rasmussen, MD 02/24/20 1757

## 2020-02-24 NOTE — Discharge Instructions (Signed)
Medications as prescribed.  Follow-up outpatient with psychiatry

## 2020-02-24 NOTE — ED Notes (Signed)
Pt to scanner 

## 2020-02-28 ENCOUNTER — Emergency Department (HOSPITAL_COMMUNITY)
Admission: EM | Admit: 2020-02-28 | Discharge: 2020-02-28 | Disposition: A | Discharge status: Home / Self Care | Payer: 59 | Attending: Emergency Medicine | Admitting: Emergency Medicine

## 2020-02-28 ENCOUNTER — Encounter (HOSPITAL_COMMUNITY): Payer: Self-pay | Admitting: Emergency Medicine

## 2020-02-28 DIAGNOSIS — I10 Essential (primary) hypertension: Secondary | ICD-10-CM | POA: Diagnosis not present

## 2020-02-28 DIAGNOSIS — R519 Headache, unspecified: Secondary | ICD-10-CM | POA: Insufficient documentation

## 2020-02-28 DIAGNOSIS — E119 Type 2 diabetes mellitus without complications: Secondary | ICD-10-CM | POA: Diagnosis not present

## 2020-02-28 DIAGNOSIS — Z853 Personal history of malignant neoplasm of breast: Secondary | ICD-10-CM | POA: Diagnosis not present

## 2020-02-28 DIAGNOSIS — J45909 Unspecified asthma, uncomplicated: Secondary | ICD-10-CM | POA: Insufficient documentation

## 2020-02-28 DIAGNOSIS — Z79899 Other long term (current) drug therapy: Secondary | ICD-10-CM | POA: Insufficient documentation

## 2020-02-28 DIAGNOSIS — R0981 Nasal congestion: Secondary | ICD-10-CM | POA: Diagnosis present

## 2020-02-28 DIAGNOSIS — R05 Cough: Secondary | ICD-10-CM | POA: Diagnosis not present

## 2020-02-28 DIAGNOSIS — Z87891 Personal history of nicotine dependence: Secondary | ICD-10-CM | POA: Insufficient documentation

## 2020-02-28 MED ORDER — FLUTICASONE PROPIONATE 50 MCG/ACT NA SUSP
2.0000 | Freq: Every day | NASAL | 0 refills | Status: DC
Start: 2020-02-28 — End: 2021-08-01

## 2020-02-28 MED ORDER — ACETAMINOPHEN 500 MG PO TABS
1000.0000 mg | ORAL_TABLET | Freq: Once | ORAL | Status: AC
  Administered 2020-02-28: 1000 mg via ORAL
  Filled 2020-02-28: qty 2

## 2020-02-28 MED ORDER — BENZONATATE 100 MG PO CAPS
100.0000 mg | ORAL_CAPSULE | Freq: Three times a day (TID) | ORAL | 0 refills | Status: DC
Start: 2020-02-28 — End: 2021-03-17

## 2020-02-28 MED ORDER — IBUPROFEN 400 MG PO TABS
600.0000 mg | ORAL_TABLET | Freq: Once | ORAL | Status: AC
  Administered 2020-02-28: 600 mg via ORAL
  Filled 2020-02-28: qty 1

## 2020-02-28 NOTE — ED Notes (Signed)
Pt. Ate most of her Happy meal , stated, I feel better.

## 2020-02-28 NOTE — ED Notes (Signed)
given Happy meal with Ginger Ale.

## 2020-02-28 NOTE — ED Triage Notes (Addendum)
Pt arrives via [facility] from home with c/o headache x1 hour, denies visual changes. Pt also endorses some vaginal discharge, currently on menstrual cycle. Pt rambling when asked about medication hx, she reports hx of schizophrenia but states she is taking all of her medications, no SI or HI, pt pleasant and cooperative. VSS, a/ox4. resp e/u, nad.

## 2020-02-28 NOTE — Discharge Instructions (Addendum)
Viral Illness TREATMENT   I have prescribed you fluticasone which is a nasal spray.  Please use this as prescribed.  Also use benzonatate which is a cough medication for cough.   Treatment is directed at relieving symptoms. There is no cure. Antibiotics are not effective, because the infection is caused by a virus, not by bacteria. Treatment may include:  Increased fluid intake. Sports drinks offer valuable electrolytes, sugars, and fluids.  Breathing heated mist or steam (vaporizer or shower).  Eating chicken soup or other clear broths, and maintaining good nutrition.  Getting plenty of rest.  Using gargles or lozenges for comfort.  Increasing usage of your inhaler if you have asthma.  Return to work when your temperature has returned to normal.  Gargle warm salt water and spit it out for sore throat. Take benadryl to decrease sinus secretions. Continue to alternate between Tylenol and ibuprofen for pain and fever control.  Follow Up: Follow up with your primary care doctor in 5-7 days for recheck of ongoing symptoms.  Return to emergency department for emergent changing or worsening of symptoms.   Headache Please drink plenty of water and stay hydrated.  Eat regularly and take your medications such as paliperidone as prescribed. Please use Tylenol or ibuprofen for pain.  You may use 600 mg ibuprofen every 6 hours or 1000 mg of Tylenol every 6 hours.  You may choose to alternate between the 2.  This would be most effective.  Not to exceed 4 g of Tylenol within 24 hours.  Not to exceed 3200 mg ibuprofen 24 hours.

## 2020-02-28 NOTE — ED Provider Notes (Signed)
[name]   CSN: [id number] Arrival date & time: 02/28/20  4580     History Chief Complaint  Patient presents with  . Headache  . Cough  . Nasal Congestion    [name] is a 49 y.o. female.  HPI Patient is a 48 year old female with a history of anxiety, bipolar, depression, schizophrenia, seizures, DM without complication.  She is presented today with headache that began 1 hour prior to arrival.  She states that it is left-sided achy pain.  She states that it is somewhat improved since she was transported by EMS.  She initially told triage that she was having vaginal discharge however denies this with me.  She denies any fevers, chills, nausea or vomiting or diarrhea.  She denies any abdominal pain chest pain or shortness of breath.  She does endorse some congestion for the last week.  Patient has questions about her diagnosis of schizophrenia she states that she takes her paliperidone as prescribed however has not taken it yet today.  She states that she will take it when she gets home but she would like to talk to her psychiatrist soon.    She states that she is otherwise feeling quite well.  Denies any lower abdominal pain, pelvic pain, discharge, vaginal bleeding.     Past Medical History:  Diagnosis Date  . Anemia   . Anxiety   . Asthma   . Bipolar 1 disorder ([place])   . Breast cancer ([place])   . Depression   . Diabetes mellitus without complication ([place])   . Hypertension   . Insomnia, persistent   . Schizophrenic disorder ([place])   . Seizures [facility])     Patient Active Problem List   Diagnosis Date Noted  . Bipolar disorder, manic ([place]) 02/02/2020  . Encounter for medical clearance for patient hold   . Noncompliance with treatment 07/16/2015  . Schizoaffective disorder, bipolar type ([place]) 01/11/2015    Past Surgical History:  Procedure Laterality Date  . BREAST SURGERY Left   . TONSILLECTOMY       OB  History    Gravida  2   Para  2   Term  2   Preterm      AB      Living  2     SAB      TAB      Ectopic      Multiple      Live Births  2           Family History  Problem Relation Age of Onset  . Depression Mother   . Gout Mother   . Cancer Father        prostate  . Other Father        lung issue  . Alcoholism Other   . Heart attack Paternal Grandfather   . Heart attack Paternal Grandmother   . Heart attack Maternal Grandmother   . Heart attack Maternal Grandfather   . Depression Son   . Anxiety disorder Son     Social History   Tobacco Use  . Smoking status: Former Smoker    Types: Cigarettes  . Smokeless tobacco: Never Used  Vaping Use  . Vaping Use: Never used  Substance Use Topics  . Alcohol use: Not Currently  . Drug use: No    Home Medications Prior to Admission medications   Medication Sig Start Date End Date Taking? Authorizing Provider  albuterol (VENTOLIN HFA) 108 (  90 Base) MCG/ACT inhaler Inhale 2 puffs into the lungs every 6 (six) hours as needed for wheezing or shortness of breath. 02/06/20   Clapacs, [name], MD  benzonatate (TESSALON) 100 MG capsule Take 1 capsule (100 mg total) by mouth every 8 (eight) hours. 02/28/20   Tedd Sias, PA  benztropine (COGENTIN) 0.5 MG tablet Take 1 tablet (0.5 mg total) by mouth at bedtime. 02/06/20   Clapacs, [name], MD  budesonide-formoterol (SYMBICORT) 160-4.5 MCG/ACT inhaler Inhale 2 puffs into the lungs 2 (two) times daily. For Shortness of breath 02/06/20   Clapacs, [name], MD  divalproex (DEPAKOTE) 500 MG DR tablet Take 1 tablet (500 mg total) by mouth every 12 (twelve) hours. 02/06/20   Clapacs, [name], MD  fluticasone (FLONASE) 50 MCG/ACT nasal spray Place 2 sprays into both nostrils daily for 14 days. 02/28/20 03/13/20  Tedd Sias, PA  fluticasone (FLOVENT HFA) 44 MCG/ACT inhaler Inhale 2 puffs into the lungs 2 (two) times daily. For SOB 02/06/20   Clapacs, [name], MD  hydrOXYzine  (ATARAX/VISTARIL) 25 MG tablet Take 1 tablet (25 mg total) by mouth every 6 (six) hours. 02/24/20   Henderly, Britni A, PA-C  paliperidone (INVEGA SUSTENNA) 234 MG/1.5ML SUSY injection Inject 234 mg into the muscle once for 1 dose. 02/06/20 02/24/20  Clapacs, [name], MD  paliperidone (INVEGA) 6 MG 24 hr tablet Take 1 tablet (6 mg total) by mouth at bedtime. 02/06/20   Clapacs, [name], MD  temazepam (RESTORIL) 15 MG capsule Take 1 capsule (15 mg total) by mouth at bedtime as needed for sleep. 02/06/20   Clapacs, [name], MD  traZODone (DESYREL) 100 MG tablet Take 100 mg by mouth at bedtime. 02/19/20   [provider]  ferrous sulfate 325 (65 FE) MG tablet Take 1 tablet (325 mg total) by mouth daily with breakfast. Patient not taking: Reported on 04/20/2019 12/24/18 04/20/19  [name], MD    Allergies    Haldol [haloperidol decanoate], Haloperidol, Penicillins, Pollen extract, and Shrimp [shellfish allergy]  Review of Systems   Review of Systems  Constitutional: Negative for chills and fever.  HENT: Positive for congestion.   Eyes: Negative for pain.  Respiratory: Positive for cough. Negative for shortness of breath.   Cardiovascular: Negative for chest pain and leg swelling.  Gastrointestinal: Negative for abdominal pain and vomiting.  Genitourinary: Negative for dysuria.  Musculoskeletal: Negative for myalgias.  Skin: Negative for rash.  Neurological: Positive for headaches. Negative for dizziness.    Physical Exam Updated Vital Signs BP (!) 126/108 (BP Location: Left Arm)   Pulse 74   Temp 97.6 F (36.4 C) (Oral)   Resp 15   LMP 02/28/2020   SpO2 100%   Physical Exam Vitals and nursing [name] reviewed.  Constitutional:      General: She is not in acute distress.    Comments: Well-appearing 49 year old female, pleasant, able to answer questions appropriately, follow commands appropriately, is obese  HENT:     Head: Normocephalic and atraumatic.     Nose: Nose normal.    Eyes:     General: No scleral icterus. Cardiovascular:     Rate and Rhythm: Normal rate and regular rhythm.     Pulses: Normal pulses.     Heart sounds: Normal heart sounds.  Pulmonary:     Effort: Pulmonary effort is normal. No respiratory distress.     Breath sounds: Normal breath sounds. No wheezing.  Abdominal:     Palpations: Abdomen  is soft.     Tenderness: There is no abdominal tenderness. There is no right CVA tenderness, left CVA tenderness, guarding or rebound.  Musculoskeletal:     Cervical back: Normal range of motion.     Right lower leg: No edema.     Left lower leg: No edema.  Skin:    General: Skin is warm and dry.     Capillary Refill: Capillary refill takes less than 2 seconds.  Neurological:     Mental Status: She is alert. Mental status is at baseline.  Psychiatric:        Mood and Affect: Mood normal.     Comments: Patient is with normal mood.  Denies HI, SI, AVH.  Seems to have good insight but is rambling at times.  Does not have any flight of ideas or pressured speech but is somewhat tangential.     ED Results / Procedures / Treatments   Labs (all labs ordered are listed, but only abnormal results are displayed) Labs Reviewed - No data to display  EKG None  Radiology No results found.  Procedures Procedures (including critical care time)  Medications Ordered in ED Medications  acetaminophen (TYLENOL) tablet 1,000 mg (1,000 mg Oral Given 02/28/20 0839)  ibuprofen (ADVIL) tablet 600 mg (600 mg Oral Given 02/28/20 0840)    ED Course  I have reviewed the triage vital signs and the nursing notes.  Pertinent labs & imaging results that were available during my care of the patient were reviewed by me and considered in my medical decision making (see chart for details).    MDM Rules/Calculators/A&P                           Patient is 49 year old female past medical history significant for bipolar disorder and schizophrenia she is on medications  for this including paliperidone she is presented to the emergency department today for complaints of headache for 1 hour.  She denies any other symptoms although she endorsed vaginal discharge in triage she denies this now.  Physical exam is unremarkable. Patient does display some tangential thinking.  She does not appear to be manic currently and she denies any SI, HI, AVH.  She seems very reasonable she has not taken her paliperidone yet today.  We will treat her headache with ibuprofen and Tylenol and reevaluate.  On reevaluation states she feels significantly better and she has 0 headache at this time.  Denies any other symptoms.  Is agreeable to discharge home.  She is eating a sandwich and drink water she is tolerating p.o. without difficulty.  We will discharge with Flonase and benzonatate as she has had some coughing.  She is agreeable to plan.  She states that her congestion seems pretty minimal right now.  She denies any fevers or chills.  We will discharge home as patient has 0 symptoms at this time.  She understands plan and is agreeable.  She states she will begin taking her antipsychotic again when she returns home.  Final Clinical Impression(s) / ED Diagnoses Final diagnoses:  Nasal congestion    Rx / DC Orders ED Discharge Orders         Ordered    fluticasone (FLONASE) 50 MCG/ACT nasal spray  Daily     Discontinue  Reprint     02/28/20 0839    benzonatate (TESSALON) 100 MG capsule  Every 8 hours     Discontinue  Reprint  02/28/20 [place], Reuel Lamadrid S, PA 02/28/20 1042    Truddie Hidden, MD 02/28/20 1141

## 2020-03-01 ENCOUNTER — Other Ambulatory Visit: Payer: Self-pay

## 2020-03-01 ENCOUNTER — Encounter (HOSPITAL_COMMUNITY): Payer: Self-pay | Admitting: Emergency Medicine

## 2020-03-01 ENCOUNTER — Emergency Department (HOSPITAL_COMMUNITY)
Admission: EM | Admit: 2020-03-01 | Discharge: 2020-03-02 | Disposition: A | Discharge status: Other Acute Inpatient Hospital | Payer: 59 | Attending: Emergency Medicine | Admitting: Emergency Medicine

## 2020-03-01 DIAGNOSIS — E119 Type 2 diabetes mellitus without complications: Secondary | ICD-10-CM | POA: Insufficient documentation

## 2020-03-01 DIAGNOSIS — I10 Essential (primary) hypertension: Secondary | ICD-10-CM | POA: Insufficient documentation

## 2020-03-01 DIAGNOSIS — Z87891 Personal history of nicotine dependence: Secondary | ICD-10-CM | POA: Insufficient documentation

## 2020-03-01 DIAGNOSIS — Z7982 Long term (current) use of aspirin: Secondary | ICD-10-CM | POA: Insufficient documentation

## 2020-03-01 DIAGNOSIS — Z79899 Other long term (current) drug therapy: Secondary | ICD-10-CM | POA: Diagnosis not present

## 2020-03-01 DIAGNOSIS — F209 Schizophrenia, unspecified: Secondary | ICD-10-CM | POA: Diagnosis not present

## 2020-03-01 DIAGNOSIS — F200 Paranoid schizophrenia: Secondary | ICD-10-CM

## 2020-03-01 DIAGNOSIS — Z20822 Contact with and (suspected) exposure to covid-19: Secondary | ICD-10-CM | POA: Diagnosis not present

## 2020-03-01 DIAGNOSIS — F600 Paranoid personality disorder: Secondary | ICD-10-CM | POA: Insufficient documentation

## 2020-03-01 DIAGNOSIS — J45909 Unspecified asthma, uncomplicated: Secondary | ICD-10-CM | POA: Diagnosis not present

## 2020-03-01 DIAGNOSIS — R4182 Altered mental status, unspecified: Secondary | ICD-10-CM | POA: Diagnosis present

## 2020-03-01 LAB — COMPREHENSIVE METABOLIC PANEL
ALT: 13 U/L (ref 0–44)
AST: 16 U/L (ref 15–41)
Albumin: 4.1 g/dL (ref 3.5–5.0)
Alkaline Phosphatase: 62 U/L (ref 38–126)
Anion gap: 10 (ref 5–15)
BUN: 7 mg/dL (ref 6–20)
CO2: 26 mmol/L (ref 22–32)
Calcium: 9.4 mg/dL (ref 8.9–10.3)
Chloride: 106 mmol/L (ref 98–111)
Creatinine, Ser: 0.65 mg/dL (ref 0.44–1.00)
GFR calc Af Amer: >60 mL/min (ref >60)
GFR calc non Af Amer: >60 mL/min (ref >60)
Glucose, Bld: 100 mg/dL — ABNORMAL HIGH (ref 70–99)
Potassium: 3.9 mmol/L (ref 3.5–5.1)
Sodium: 142 mmol/L (ref 135–145)
Total Bilirubin: 0.5 mg/dL (ref 0.3–1.2)
Total Protein: 7.5 g/dL (ref 6.5–8.1)

## 2020-03-01 LAB — RAPID URINE DRUG SCREEN, HOSP PERFORMED
Amphetamines: NOT DETECTED
Barbiturates: NOT DETECTED
Benzodiazepines: NOT DETECTED
Cocaine: NOT DETECTED
Opiates: NOT DETECTED
Tetrahydrocannabinol: NOT DETECTED

## 2020-03-01 LAB — CBC WITH DIFFERENTIAL/PLATELET
Abs Immature Granulocytes: 0 10*3/uL (ref 0.00–0.07)
Basophils Absolute: 0 10*3/uL (ref 0.0–0.1)
Basophils Relative: 0 %
Eosinophils Absolute: 0.3 10*3/uL (ref 0.0–0.5)
Eosinophils Relative: 6 %
HCT: 32.9 % — ABNORMAL LOW (ref 36.0–46.0)
Hemoglobin: 10.9 g/dL — ABNORMAL LOW (ref 12.0–15.0)
Immature Granulocytes: 0 %
Lymphocytes Relative: 35 %
Lymphs Abs: 1.7 10*3/uL (ref 0.7–4.0)
MCH: 30.6 pg (ref 26.0–34.0)
MCHC: 33.1 g/dL (ref 30.0–36.0)
MCV: 92.4 fL (ref 80.0–100.0)
Monocytes Absolute: 0.3 10*3/uL (ref 0.1–1.0)
Monocytes Relative: 6 %
Neutro Abs: 2.6 10*3/uL (ref 1.7–7.7)
Neutrophils Relative %: 53 %
Platelets: 214 10*3/uL (ref 150–400)
RBC: 3.56 MIL/uL — ABNORMAL LOW (ref 3.87–5.11)
RDW: 12.4 % (ref 11.5–15.5)
WBC: 4.9 10*3/uL (ref 4.0–10.5)
nRBC: 0 % (ref 0.0–0.2)

## 2020-03-01 LAB — ETHANOL: Alcohol, Ethyl (B): <10 mg/dL (ref <10)

## 2020-03-01 LAB — VALPROIC ACID LEVEL: Valproic Acid Lvl: <10 ug/mL — ABNORMAL LOW (ref 50.0–100.0)

## 2020-03-01 LAB — I-STAT BETA HCG BLOOD, ED (MC, WL, AP ONLY): I-stat hCG, quantitative: <5 m[IU]/mL (ref <5)

## 2020-03-01 LAB — SARS CORONAVIRUS 2 BY RT PCR (HOSPITAL ORDER, PERFORMED IN ~~LOC~~ HOSPITAL LAB): SARS Coronavirus 2: NEGATIVE

## 2020-03-01 MED ORDER — DIVALPROEX SODIUM 500 MG PO DR TAB
500.0000 mg | DELAYED_RELEASE_TABLET | Freq: Two times a day (BID) | ORAL | Status: DC
  Administered 2020-03-01 – 2020-03-02 (×3): 500 mg via ORAL
  Filled 2020-03-01 (×3): qty 1

## 2020-03-01 MED ORDER — PALIPERIDONE ER 6 MG PO TB24
6.0000 mg | ORAL_TABLET | Freq: Every day | ORAL | Status: DC
  Administered 2020-03-01: 6 mg via ORAL
  Filled 2020-03-01: qty 1

## 2020-03-01 MED ORDER — BENZTROPINE MESYLATE 0.5 MG PO TABS
0.5000 mg | ORAL_TABLET | Freq: Every day | ORAL | Status: DC
  Administered 2020-03-01: 0.5 mg via ORAL
  Filled 2020-03-01: qty 1

## 2020-03-01 MED ORDER — OLANZAPINE 10 MG PO TBDP
10.0000 mg | ORAL_TABLET | Freq: Once | ORAL | Status: AC
  Administered 2020-03-01: 10 mg via ORAL
  Filled 2020-03-01: qty 1

## 2020-03-01 MED ORDER — TRAZODONE HCL 100 MG PO TABS
100.0000 mg | ORAL_TABLET | Freq: Every day | ORAL | Status: DC
  Administered 2020-03-01: 100 mg via ORAL
  Filled 2020-03-01: qty 1
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
## 2020-03-01 NOTE — ED Provider Notes (Signed)
[facility] DEPT Provider Note   CSN: [id number] Arrival date & time: 03/01/20  0759     History Chief Complaint  Patient presents with  . Foot Pain  . Psychiatric Evaluation    Shaindel Sweeten is a 49 y.o. female.  The history is provided by the patient and medical records. No language interpreter was used.  Foot Pain   Jaeline Whobrey is a 49 y.o. female who presents to the Emergency Department complaining of psychiatric evaluation. She presents the emergency department by [facility] voluntarily for psychiatric evaluation. She feels like her medications are not working. She states that she is compliant with the medications. She denies any SI but states that she could take many of her Depakote. Level V caveat due to psychiatric illness.    Past Medical History:  Diagnosis Date  . Anemia   . Anxiety   . Asthma   . Bipolar 1 disorder ([place])   . Breast cancer ([place])   . Depression   . Diabetes mellitus without complication ([place])   . Hypertension   . Insomnia, persistent   . Schizophrenic disorder ([place])   . Seizures [facility])     Patient Active Problem List   Diagnosis Date Noted  . Bipolar disorder, manic ([place]) 02/02/2020  . Encounter for medical clearance for patient hold   . Noncompliance with treatment 07/16/2015  . Schizoaffective disorder, bipolar type ([place]) 01/11/2015    Past Surgical History:  Procedure Laterality Date  . BREAST SURGERY Left   . TONSILLECTOMY       OB History    Gravida  2   Para  2   Term  2   Preterm      AB      Living  2     SAB      TAB      Ectopic      Multiple      Live Births  2           Family History  Problem Relation Age of Onset  . Depression Mother   . Gout Mother   . Cancer Father        prostate  . Other Father        lung issue  . Alcoholism Other   . Heart attack Paternal Grandfather   . Heart attack Paternal Grandmother   . Heart attack Maternal Grandmother   . Heart  attack Maternal Grandfather   . Depression Son   . Anxiety disorder Son     Social History   Tobacco Use  . Smoking status: Former Smoker    Types: Cigarettes  . Smokeless tobacco: Never Used  Vaping Use  . Vaping Use: Never used  Substance Use Topics  . Alcohol use: Not Currently  . Drug use: No    Home Medications Prior to Admission medications   Medication Sig Start Date End Date Taking? Authorizing Provider  albuterol (VENTOLIN HFA) 108 (90 Base) MCG/ACT inhaler Inhale 2 puffs into the lungs every 6 (six) hours as needed for wheezing or shortness of breath. 02/06/20  Yes Clapacs, [name], MD  aspirin 325 MG tablet Take 325 mg by mouth every 4 (four) hours as needed for mild pain or headache.   Yes [provider]  benztropine (COGENTIN) 0.5 MG tablet Take 1 tablet (0.5 mg total) by mouth at bedtime. 02/06/20  Yes Clapacs, [name], MD  budesonide-formoterol (SYMBICORT) 160-4.5 MCG/ACT inhaler Inhale 2 puffs into the lungs 2 (  two) times daily. For Shortness of breath 02/06/20  Yes Clapacs, [name], MD  divalproex (DEPAKOTE) 500 MG DR tablet Take 1 tablet (500 mg total) by mouth every 12 (twelve) hours. Patient taking differently: Take 1,000 mg by mouth at bedtime.  02/06/20  Yes Clapacs, [name], MD  DM-Phenylephrine-Acetaminophen (VICKS DAYQUIL COLD & FLU) 10-5-325 MG/15ML LIQD Take 15 mLs by mouth as needed (congestion).   Yes [provider]  fluticasone (FLOVENT HFA) 44 MCG/ACT inhaler Inhale 2 puffs into the lungs 2 (two) times daily. For SOB 02/06/20  Yes Clapacs, [name], MD  paliperidone (INVEGA SUSTENNA) 234 MG/1.5ML SUSY injection Inject 234 mg into the muscle once for 1 dose. 02/06/20 03/01/20 Yes Clapacs, [name], MD  temazepam (RESTORIL) 15 MG capsule Take 1 capsule (15 mg total) by mouth at bedtime as needed for sleep. 02/06/20  Yes Clapacs, [name], MD  traZODone (DESYREL) 100 MG tablet Take 100 mg by mouth at bedtime. 02/19/20  Yes [provider]   benzonatate (TESSALON) 100 MG capsule Take 1 capsule (100 mg total) by mouth every 8 (eight) hours. 02/28/20   [place], Kathleene Hazel, PA  fluticasone (FLONASE) 50 MCG/ACT nasal spray Place 2 sprays into both nostrils daily for 14 days. 02/28/20 03/13/20  Tedd Sias, PA  hydrOXYzine (ATARAX/VISTARIL) 25 MG tablet Take 1 tablet (25 mg total) by mouth every 6 (six) hours. 02/24/20   Henderly, Britni A, PA-C  paliperidone (INVEGA) 6 MG 24 hr tablet Take 1 tablet (6 mg total) by mouth at bedtime. 02/06/20   Clapacs, [name], MD  ferrous sulfate 325 (65 FE) MG tablet Take 1 tablet (325 mg total) by mouth daily with breakfast. Patient not taking: Reported on 04/20/2019 12/24/18 04/20/19  [name], MD    Allergies    Haldol [haloperidol decanoate], Haloperidol, Penicillins, Pollen extract, and Shrimp [shellfish allergy]  Review of Systems   Review of Systems  All other systems reviewed and are negative.   Physical Exam Updated Vital Signs BP (!) 136/95 (BP Location: Left Arm)   Pulse 80   Temp 99.1 F (37.3 C) (Oral)   Resp 18   Ht 5\' 6"  (1.676 m)   Wt 108.4 kg   LMP 02/28/2020   SpO2 100%   BMI 38.58 kg/m   Physical Exam Vitals and nursing note reviewed.  Constitutional:      Appearance: She is well-developed.  HENT:     Head: Normocephalic and atraumatic.  Cardiovascular:     Rate and Rhythm: Normal rate and regular rhythm.     Heart sounds: No murmur heard.   Pulmonary:     Effort: Pulmonary effort is normal. No respiratory distress.     Breath sounds: Normal breath sounds.  Musculoskeletal:        General: No tenderness.  Skin:    General: Skin is warm and dry.  Neurological:     Mental Status: She is alert and oriented to person, place, and time.  Psychiatric:     Comments: Pressured speech with flight of ideas. Paranoid.     ED Results / Procedures / Treatments   Labs (all labs ordered are listed, but only abnormal results are displayed) Labs Reviewed   COMPREHENSIVE METABOLIC PANEL - Abnormal; Notable for the following components:      Result Value   Glucose, Bld 100 (*)    All other components within normal limits  CBC WITH DIFFERENTIAL/PLATELET - Abnormal; Notable for the following components:   RBC 3.56 (*)  Hemoglobin 10.9 (*)    HCT 32.9 (*)    All other components within normal limits  VALPROIC ACID LEVEL - Abnormal; Notable for the following components:   Valproic Acid Lvl <10 (*)    All other components within normal limits  SARS CORONAVIRUS 2 BY RT PCR (HOSPITAL ORDER, [place] [facility])  ETHANOL  RAPID URINE DRUG SCREEN, HOSP PERFORMED  I-STAT BETA HCG BLOOD, ED ([facility], [facility], [facility] ONLY)    EKG None  Radiology No results found.  Procedures Procedures (including critical care time)  Medications Ordered in ED Medications  benztropine (COGENTIN) tablet 0.5 mg (has no administration in time range)  divalproex (DEPAKOTE) DR tablet 500 mg (500 mg Oral Given 03/01/20 1256)  traZODone (DESYREL) tablet 100 mg (has no administration in time range)  paliperidone (INVEGA) 24 hr tablet 6 mg (has no administration in time range)  OLANZapine zydis (ZYPREXA) disintegrating tablet 10 mg (10 mg Oral Given 03/01/20 1006)    ED Course  I have reviewed the triage vital signs and the nursing notes.  Pertinent labs & imaging results that were available during my care of the patient were reviewed by me and considered in my medical decision making (see chart for details).    MDM Rules/Calculators/A&P                         Patient with history of schizophrenia here for evaluation of paranoid thoughts, delusions and pressured speech. Concern for patient escalation and potential elopement and IVC was completed for patient safety. She was treated with Zyprexa for her agitation. She has been medically cleared for psychiatric evaluation and treatment.  Final Clinical Impression(s) / ED Diagnoses Final diagnoses:  None     Rx / DC Orders ED Discharge Orders    None       Quintella Reichert, MD 03/01/20 1651

## 2020-03-01 NOTE — BH Assessment (Addendum)
[facility] Assessment Progress Note  Per Hampton Abbot, MD, this voluntary pt requires psychiatric hospitalization at this time.  The following facilities have been contacted to seek placement for this pt, with results as noted:  Beds available, information sent, decision pending: [place]: [facility], [employer] Coordinator [phone]
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
## 2020-03-02 ENCOUNTER — Inpatient Hospital Stay (HOSPITAL_COMMUNITY): Admission: AD | Admit: 2020-03-02 | Payer: 59 | Admitting: Psychiatry

## 2020-03-02 NOTE — BH Assessment (Signed)
[facility] Assessment Progress Note  Per Sheran Fava, DNP, this pt requires psychiatric hospitalization at this time.  Pt presents under IVC initiated by EDP Quintella Reichert, MD.  At 14:08 [name] Hail calls from [facility] to report that pt has been accepted to their facility by Dr. Jonelle Sports.  They ask that pt be sent as close to 18:00 as possible.  Farris Has concurs with this decision.  Pt's nurse has been notified, and agrees to call report to [phone].  Pt is to be transported via [facility].  [place] Coordinator [phone]

## 2020-03-02 NOTE — ED Notes (Signed)
Attempted to call [employer] Department for transport and they said they would not be able to transport the patient until the next morning.

## 2020-03-02 NOTE — ED Notes (Signed)
Meal tray given 

## 2020-03-02 NOTE — ED Notes (Signed)
Breakfast tray given. °

## 2020-03-19 ENCOUNTER — Other Ambulatory Visit: Payer: Self-pay | Admitting: Psychiatry

## 2020-04-16 ENCOUNTER — Other Ambulatory Visit: Payer: Self-pay | Admitting: Psychiatry

## 2020-04-18 ENCOUNTER — Other Ambulatory Visit: Payer: Self-pay | Admitting: Psychiatry

## 2020-04-19 ENCOUNTER — Ambulatory Visit (HOSPITAL_COMMUNITY)
Admission: EM | Admit: 2020-04-19 | Discharge: 2020-04-19 | Disposition: A | Discharge status: Home / Self Care | Payer: 59 | Attending: Nurse Practitioner | Admitting: Nurse Practitioner

## 2020-04-19 ENCOUNTER — Other Ambulatory Visit: Payer: Self-pay | Admitting: Psychiatry

## 2020-04-19 ENCOUNTER — Other Ambulatory Visit: Payer: Self-pay

## 2020-04-19 DIAGNOSIS — F250 Schizoaffective disorder, bipolar type: Secondary | ICD-10-CM | POA: Diagnosis not present

## 2020-04-19 DIAGNOSIS — F259 Schizoaffective disorder, unspecified: Secondary | ICD-10-CM | POA: Insufficient documentation

## 2020-04-19 NOTE — ED Notes (Signed)
Locker #[id number]

## 2020-04-19 NOTE — Discharge Instructions (Signed)
Follow Up with ACT Team

## 2020-04-19 NOTE — ED Provider Notes (Signed)
Behavioral Health Medical Screening Exam  [name] is a 49 y.o. female with a history of schizoaffective disorder who presents to [facility] voluntarily with law enforcement due to aduitory hallucinations. Patient reports that she heard a voice whispering I her right ear when she was sleeping tonight. She state that the voice caused her a lot of anxiety. She states that she usually completes word searches as a coping mechanism, but the word searches did not help with her anxiety tonight. She states that she contacted law enforcement and asked to be brought to [facility] so that she could talk to someone. She states that she is followed by [id number] ACT Team. States that she received Invega injection 234 mg last week. She denies suicidal ideations, Denies homicidal ideations. She states that she feels safe returning home tonight.  On evaluation patient is alert and oriented x 4, pleasant, and cooperative. Speech is clear and coherent, normal pace, and normal volume.  Mood is depressed/anxious. Affect is blunt. Thought process is circumstantial, but coherent.   Denies current auditory and visual hallucinations. No indication that patient is responding to internal stimuli. No evidence of delusional thought content.Denies suicidal ideations. Denies homicidal ideations. Denies substance abuse.    Total Time spent with patient: 30 minutes   Past Psychiatric History: Patient apparently began to have psychotic symptoms shortly after college.  Long history of chronic psychotic disorder.  Multiple prior hospitalizations.  She has 2 children ages 54 and 73 who live with the patient's mother.  Denies any history of alcohol or drug abuse problems.  She does state that she has had suicide attempts in the past but it has been years ago.  Psychiatric Specialty Exam  Presentation  General Appearance:Appropriate for Environment;Well Groomed  Eye Contact:Good  Speech:Normal Rate;Clear and Coherent  Speech  Volume:Normal  Handedness:No data recorded  Mood and Affect  Mood:Depressed  Affect:Congruent;Blunt   Thought Process  Thought Processes:Coherent;Goal Directed  Descriptions of Associations:Circumstantial  Orientation:Full (Time, Place and Person)  Thought Content:Other (comment) (circumstantial)  Hallucinations:Other (comment) (nonce currently, heard whispering in her ear while sleeping)  Ideas of Reference:None  Suicidal Thoughts:No  Homicidal Thoughts:No   Sensorium  Memory:Immediate Good;Recent Good;Remote Good  Judgment:Good  Insight:Fair   Executive Functions  Concentration:Fair  Attention Span:Fair  [place]   Psychomotor Activity  Psychomotor Activity:Normal   Assets  Assets:Desire for Improvement;Communication Skills;Social Support;Housing;Physical Health   Sleep  Sleep:Fair  Number of hours: No data recorded  Physical Exam: Physical Exam Constitutional:      General: She is not in acute distress.    Appearance: She is obese. She is not ill-appearing, toxic-appearing or diaphoretic.  HENT:     Head: Normocephalic.     Right Ear: External ear normal.     Left Ear: External ear normal.  Eyes:     Pupils: Pupils are equal, round, and reactive to light.  Cardiovascular:     Rate and Rhythm: Normal rate.  Pulmonary:     Effort: Pulmonary effort is normal. No respiratory distress.  Musculoskeletal:        General: Normal range of motion.  Skin:    General: Skin is warm and dry.  Neurological:     Mental Status: She is alert and oriented to person, place, and time.  Psychiatric:        Mood and Affect: Mood is anxious and depressed.        Behavior: Behavior is cooperative.  Thought Content: Thought content is not paranoid or delusional. Thought content does not include homicidal or suicidal ideation.    Review of Systems  Constitutional: Negative for chills, diaphoresis, fever,  malaise/fatigue and weight loss.  Respiratory: Negative for cough and shortness of breath.   Cardiovascular: Negative for chest pain.  Gastrointestinal: Negative for diarrhea, nausea and vomiting.  Neurological: Negative for dizziness and seizures.  Psychiatric/Behavioral: Positive for depression and hallucinations. Negative for memory loss, substance abuse and suicidal ideas. The patient is nervous/anxious and has insomnia.    Blood pressure 130/86, pulse 87, temperature 98.6 F (37 C), resp. rate 18, SpO2 99 %. There is no height or weight on file to calculate BMI.  Musculoskeletal: Strength & Muscle Tone: within normal limits Gait & Station: normal Patient leans: N/A   Recommendations:  Based on my evaluation the patient does not appear to have an emergency medical condition.   Disposition: No evidence of imminent risk to self or others at present.   Patient does not meet criteria for psychiatric inpatient admission. Supportive therapy provided about ongoing stressors. Discussed crisis plan, support from social network, calling 911, coming to the Emergency Department, and calling Suicide Hotline. Patient encouraged to follow-up with her ACT Team in the morning.  Rozetta Nunnery, NP 04/19/2020, 10:59 PM

## 2020-05-22 ENCOUNTER — Emergency Department (HOSPITAL_COMMUNITY)
Admission: EM | Admit: 2020-05-22 | Discharge: 2020-05-22 | Disposition: A | Discharge status: Home / Self Care | Payer: 59 | Attending: Emergency Medicine | Admitting: Emergency Medicine

## 2020-05-22 ENCOUNTER — Other Ambulatory Visit: Payer: Self-pay

## 2020-05-22 DIAGNOSIS — Z87891 Personal history of nicotine dependence: Secondary | ICD-10-CM | POA: Diagnosis not present

## 2020-05-22 DIAGNOSIS — I10 Essential (primary) hypertension: Secondary | ICD-10-CM | POA: Insufficient documentation

## 2020-05-22 DIAGNOSIS — R112 Nausea with vomiting, unspecified: Secondary | ICD-10-CM | POA: Insufficient documentation

## 2020-05-22 DIAGNOSIS — J45909 Unspecified asthma, uncomplicated: Secondary | ICD-10-CM | POA: Diagnosis not present

## 2020-05-22 DIAGNOSIS — E119 Type 2 diabetes mellitus without complications: Secondary | ICD-10-CM | POA: Insufficient documentation

## 2020-05-22 DIAGNOSIS — C50919 Malignant neoplasm of unspecified site of unspecified female breast: Secondary | ICD-10-CM | POA: Diagnosis not present

## 2020-05-22 LAB — CBC
HCT: 36.5 % (ref 36.0–46.0)
Hemoglobin: 12.2 g/dL (ref 12.0–15.0)
MCH: 30.3 pg (ref 26.0–34.0)
MCHC: 33.4 g/dL (ref 30.0–36.0)
MCV: 90.6 fL (ref 80.0–100.0)
Platelets: 251 10*3/uL (ref 150–400)
RBC: 4.03 MIL/uL (ref 3.87–5.11)
RDW: 12.8 % (ref 11.5–15.5)
WBC: 6.9 10*3/uL (ref 4.0–10.5)
nRBC: 0 % (ref 0.0–0.2)

## 2020-05-22 LAB — BASIC METABOLIC PANEL
Anion gap: 8 (ref 5–15)
BUN: 9 mg/dL (ref 6–20)
CO2: 25 mmol/L (ref 22–32)
Calcium: 9.1 mg/dL (ref 8.9–10.3)
Chloride: 104 mmol/L (ref 98–111)
Creatinine, Ser: 0.79 mg/dL (ref 0.44–1.00)
GFR calc Af Amer: >60 mL/min (ref >60)
GFR calc non Af Amer: >60 mL/min (ref >60)
Glucose, Bld: 129 mg/dL — ABNORMAL HIGH (ref 70–99)
Potassium: 4.1 mmol/L (ref 3.5–5.1)
Sodium: 137 mmol/L (ref 135–145)

## 2020-05-22 LAB — LIPASE, BLOOD: Lipase: 21 U/L (ref 11–51)

## 2020-05-22 NOTE — ED Triage Notes (Signed)
Pt arrived by EMS reports vomiting a small amount of blood. EMS did not see the blood because the patient already flushed it.  Hx: depression and psych  Pt. States its close for her next IM shot for her psych condition.   EMS vitals stable bp 150 palpated HR 87- 99% RA

## 2020-05-22 NOTE — Discharge Instructions (Addendum)
You were evaluated in the Emergency Department and after careful evaluation, we did not find any emergent condition requiring admission or further testing in the hospital.  Your exam/testing today was overall reassuring.  Please return to the Emergency Department if you experience any worsening of your condition.  Thank you for allowing us to be a part of your care.  

## 2020-05-22 NOTE — ED Notes (Signed)
Patient discharged left ambulatory with no concerns at this time

## 2020-05-22 NOTE — ED Provider Notes (Signed)
[facility] Emergency Department Provider Note MRN:  [id number]  Arrival date & time: 05/22/20     Chief Complaint   Emesis   History of Present Illness   [name] is a 49 y.o. year-old female with a history of bipolar disorder, diabetes, schizophrenic disorder presenting to the ED with chief complaint of emesis.  Patient had nausea and vomiting earlier today, thinks that maybe there is some blood in the vomit.  Endorsing some mild abdominal discomfort since the nausea and vomiting began.  Denies fever, no diarrhea, no constipation, no blood in the stool, no black stool, no chest pain or shortness of breath.  Patient thinks that her symptoms may be due to anxiety because she is due for her shot of psychiatric medicine.  Patient states that she has some mild life stressors recently but overall her mood is good.  Review of Systems  A complete 10 system review of systems was obtained and all systems are negative except as noted in the HPI and PMH.   [name] Health History    Past Medical History:  Diagnosis Date  . Anemia   . Anxiety   . Asthma   . Bipolar 1 disorder ([place])   . Breast cancer ([place])   . Depression   . Diabetes mellitus without complication ([place])   . Hypertension   . Insomnia, persistent   . Schizophrenic disorder ([place])   . Seizures ([place])     Past Surgical History:  Procedure Laterality Date  . BREAST SURGERY Left   . TONSILLECTOMY      Family History  Problem Relation Age of Onset  . Depression Mother   . Gout Mother   . Cancer Father        prostate  . Other Father        lung issue  . Alcoholism Other   . Heart attack Paternal Grandfather   . Heart attack Paternal Grandmother   . Heart attack Maternal Grandmother   . Heart attack Maternal Grandfather   . Depression Son   . Anxiety disorder Son     Social History   Socioeconomic History  . Marital status: Single    Spouse name: Not on file  . Number of children:  Not on file  . Years of education: Not on file  . Highest education level: Not on file  Occupational History  . Not on file  Tobacco Use  . Smoking status: Former Smoker    Types: Cigarettes  . Smokeless tobacco: Never Used  Vaping Use  . Vaping Use: Never used  Substance and Sexual Activity  . Alcohol use: Not Currently  . Drug use: No  . Sexual activity: Not Currently  Other Topics Concern  . Not on file  Social History Narrative  . Not on file   Social Determinants of Health   Financial Resource Strain:   . Difficulty of Paying Living Expenses: Not on file  Food Insecurity:   . Worried About Charity fundraiser in the Last Year: Not on file  . Ran Out of Food in the Last Year: Not on file  Transportation Needs:   . Lack of Transportation (Medical): Not on file  . Lack of Transportation (Non-Medical): Not on file  Physical Activity:   . Days of Exercise per Week: Not on file  . Minutes of Exercise per Session: Not on file  Stress:   . Feeling of Stress : Not on file  Social Connections:   .  Frequency of Communication with Friends and Family: Not on file  . Frequency of Social Gatherings with Friends and Family: Not on file  . Attends Religious Services: Not on file  . Active Member of Clubs or Organizations: Not on file  . Attends Archivist Meetings: Not on file  . Marital Status: Not on file  Intimate Partner Violence:   . Fear of Current or Ex-Partner: Not on file  . Emotionally Abused: Not on file  . Physically Abused: Not on file  . Sexually Abused: Not on file     Physical Exam   Vitals:   05/22/20 0320  BP: 130/88  Pulse: 78  Resp: 17  Temp: 97.8 F (36.6 C)  SpO2: 99%    CONSTITUTIONAL: Well-appearing, NAD NEURO:  Alert and oriented x 3, no focal deficits EYES:  eyes equal and reactive ENT/NECK:  no LAD, no JVD CARDIO: Regular rate, well-perfused, normal S1 and S2 PULM:  CTAB no wheezing or rhonchi GI/GU:  normal bowel sounds,  non-distended, non-tender MSK/SPINE:  No gross deformities, no edema SKIN:  no rash, atraumatic PSYCH: Slightly disorganized/tangential speech and behavior  *Additional and/or pertinent findings included in MDM below  Diagnostic and Interventional Summary    EKG Interpretation  Date/Time:    Ventricular Rate:    PR Interval:    QRS Duration:   QT Interval:    QTC Calculation:   R Axis:     Text Interpretation:        Labs Reviewed  BASIC METABOLIC PANEL - Abnormal; Notable for the following components:      Result Value   Glucose, Bld 129 (*)    All other components within normal limits  CBC  LIPASE, BLOOD    No orders to display    Medications - No data to display   Procedures  /  Critical Care Procedures  ED Course and Medical Decision Making  I have reviewed the triage vital signs, the nursing notes, and pertinent available records from the EMR.  Listed above are laboratory and imaging tests that I personally ordered, reviewed, and interpreted and then considered in my medical decision making (see below for details).  Overall I feel this ED visit is mostly driven by underlying psychiatric illness.  Vital signs are normal, abdomen soft and nontender, single episode of vomiting, possibly with blood.  Will obtain screening labs to ensure no significant hemoglobin drop.  Will monitor closely.  Doubt acute emergent process.  Patient has no SI no HI no AVH, seems to be at her baseline gastrically and there is no need to keep her for psychiatric reasons.     Labs are reassuring, repeat abdominal exam is soft and nontender, appropriate for discharge.  Barth Kirks. [place]onia Small, [place] [email or web address]  Final Clinical Impressions(s) / ED Diagnoses     ICD-10-CM   1. Non-intractable vomiting with nausea, unspecified vomiting type  R11.2     ED Discharge Orders    None       Discharge Instructions Discussed with and  Provided to Patient:     Discharge Instructions     You were evaluated in the Emergency Department and after careful evaluation, we did not find any emergent condition requiring admission or further testing in the hospital.  Your exam/testing today was overall reassuring.  Please return to the Emergency Department if you experience any worsening of your condition.  Thank you for allowing [place] to be a part  of your care.        Maudie Flakes, MD 05/22/20 [phone]

## 2020-07-29 ENCOUNTER — Other Ambulatory Visit: Payer: Self-pay | Admitting: Student

## 2020-07-29 DIAGNOSIS — R109 Unspecified abdominal pain: Secondary | ICD-10-CM

## 2020-08-11 ENCOUNTER — Other Ambulatory Visit: Payer: 59

## 2020-08-28 DIAGNOSIS — K8020 Calculus of gallbladder without cholecystitis without obstruction: Secondary | ICD-10-CM

## 2020-08-28 HISTORY — DX: Calculus of gallbladder without cholecystitis without obstruction: K80.20

## 2020-08-30 ENCOUNTER — Encounter: Payer: Self-pay | Admitting: *Deleted

## 2020-09-01 ENCOUNTER — Ambulatory Visit
Admission: RE | Admit: 2020-09-01 | Discharge: 2020-09-01 | Disposition: A | Discharge status: Home / Self Care | Payer: Medicare HMO | Source: Ambulatory Visit | Attending: Student | Admitting: Student

## 2020-09-01 DIAGNOSIS — R109 Unspecified abdominal pain: Secondary | ICD-10-CM

## 2020-09-16 ENCOUNTER — Other Ambulatory Visit (HOSPITAL_COMMUNITY)
Admission: RE | Admit: 2020-09-16 | Discharge: 2020-09-16 | Disposition: A | Discharge status: Home / Self Care | Payer: Medicare HMO | Source: Ambulatory Visit | Attending: Obstetrics & Gynecology | Admitting: Obstetrics & Gynecology

## 2020-09-16 ENCOUNTER — Other Ambulatory Visit: Payer: Self-pay

## 2020-09-16 ENCOUNTER — Ambulatory Visit (INDEPENDENT_AMBULATORY_CARE_PROVIDER_SITE_OTHER): Payer: Medicare HMO | Admitting: Obstetrics & Gynecology

## 2020-09-16 ENCOUNTER — Encounter: Payer: Self-pay | Admitting: Obstetrics & Gynecology

## 2020-09-16 VITALS — BP 121/81 | HR 86 | Wt 259.7 lb

## 2020-09-16 DIAGNOSIS — Z01419 Encounter for gynecological examination (general) (routine) without abnormal findings: Secondary | ICD-10-CM

## 2020-09-16 DIAGNOSIS — Z1231 Encounter for screening mammogram for malignant neoplasm of breast: Secondary | ICD-10-CM | POA: Diagnosis not present

## 2020-09-16 DIAGNOSIS — Z113 Encounter for screening for infections with a predominantly sexual mode of transmission: Secondary | ICD-10-CM | POA: Diagnosis not present

## 2020-09-16 DIAGNOSIS — Z124 Encounter for screening for malignant neoplasm of cervix: Secondary | ICD-10-CM

## 2020-09-16 NOTE — Progress Notes (Addendum)
50 y.o. [id number] Single Black or [place] American female referred here for breast and pelvic/gyn exam by [facility].  Pt does have menstrual cycles but they are getting lighter.  Menses are still monthly monthly cycle, maybe a little less regular.  Flow lasts 2-3 days.  Review of notes from [place] [address] show she has STD testing in November.  She has HIV, Hep B and A, GC/Chl testing.  Pt would like complete STD testing (or what has not been done).  Denies currently being SA.  Denies vaginal discharge.  Has not had a recent MMG.  D/w pt colorectal cancer screening.  She is doing FIT testing with [facility].  Is not interested in colonoscopy now.  Pt reports having abdominal pain but was diagnosed with gallstones.  Pain is now much better.  Does not want surgery.  [name] last menstrual period was 09/15/2020 (exact date).          Sexually active: No.  The current method of family planning is abstinence.    Exercising: No.  Smoker:  no  Health Maintenance: Pap:  2018 History of abnormal Pap:  no MMG:  Needs to have done Colonoscopy:  Never had done BMD:   n/a TDaP:  unsure Pneumonia vaccine(s):  1999 Hep C testing: will do today Screening Labs: done 07/27/2020 at [facility] (reveiewed records in [place])   reports that she has quit smoking. Her smoking use included cigarettes. She has never used smokeless tobacco. She reports previous alcohol use. She reports that she does not use drugs.  Past Medical History:  Diagnosis Date  . Anxiety   . Asthma   . Bipolar 1 disorder ([place])   . Depression   . Diabetes mellitus without complication ([place])   . Gallstones 08/2020  . Hypertension   . Insomnia, persistent   . Schizophrenic disorder ([place])   . Seizures ([place])     Past Surgical History:  Procedure Laterality Date  . BREAST LUMPECTOMY Left 07/2013  . TONSILLECTOMY      Current Outpatient Medications  Medication Sig Dispense Refill  . albuterol (VENTOLIN HFA)  108 (90 Base) MCG/ACT inhaler Inhale 2 puffs into the lungs every 6 (six) hours as needed for wheezing or shortness of breath. 18 g 1  . benztropine (COGENTIN) 0.5 MG tablet Take 1 tablet (0.5 mg total) by mouth at bedtime. 30 tablet 1  . divalproex (DEPAKOTE) 500 MG DR tablet Take 1 tablet (500 mg total) by mouth every 12 (twelve) hours. (Patient taking differently: Take 1,000 mg by mouth at bedtime.) 60 tablet 1  . DM-Phenylephrine-Acetaminophen (VICKS DAYQUIL COLD & FLU) 10-5-325 MG/15ML LIQD Take 15 mLs by mouth as needed (congestion).    . fluticasone (FLOVENT HFA) 44 MCG/ACT inhaler Inhale 2 puffs into the lungs 2 (two) times daily. For SOB 1 Inhaler 12  . ibuprofen (ADVIL) 200 MG tablet Take 200 mg by mouth every 6 (six) hours as needed.    . paliperidone (INVEGA SUSTENNA) 234 MG/1.5ML SUSY injection Inject 234 mg into the muscle once for 1 dose. 1.5 mL 1  . traZODone (DESYREL) 100 MG tablet Take 100 mg by mouth at bedtime.    . benzonatate (TESSALON) 100 MG capsule Take 1 capsule (100 mg total) by mouth every 8 (eight) hours. (Patient not taking: Reported on 09/16/2020) 21 capsule 0  . budesonide-formoterol (SYMBICORT) 160-4.5 MCG/ACT inhaler Inhale 2 puffs into the lungs 2 (two) times daily. For Shortness of breath (Patient not taking: Reported  on 09/16/2020) 1 Inhaler 0  . fluticasone (FLONASE) 50 MCG/ACT nasal spray Place 2 sprays into both nostrils daily for 14 days. 11.1 mL 0   No current facility-administered medications for this visit.    Family History  Problem Relation Age of Onset  . Depression Mother   . Gout Mother   . Cancer Father        prostate  . Other Father        lung issue  . Alcoholism Other   . Heart attack Paternal Grandfather   . Heart attack Paternal Grandmother   . Heart attack Maternal Grandmother   . Heart attack Maternal Grandfather   . Depression Son   . Anxiety disorder Son     Review of Systems  All other systems reviewed and are  negative.   Exam:   BP 121/81   Pulse 86   Wt 259 lb 11.2 oz (117.8 kg)   LMP 09/15/2020 (Exact Date)   BMI 41.92 kg/m      General appearance: alert, cooperative and appears stated age Head: Normocephalic, without obvious abnormality, atraumatic Breasts: normal appearance, no masses or tenderness Abdomen: soft, non-tender; bowel sounds normal; no masses,  no organomegaly Extremities: extremities normal, atraumatic, no cyanosis or edema Skin: Skin color, texture, turgor normal. No rashes or lesions Lymph nodes: Cervical, supraclavicular, and axillary nodes normal. No abnormal inguinal nodes palpated Neurologic: Grossly normal   Pelvic: External genitalia:  no lesions              Urethra:  normal appearing urethra with no masses, tenderness or lesions              Bartholins and Skenes: normal                 Vagina: normal appearing vagina with normal color and discharge, no lesions              Cervix: no lesions              Pap taken: Yes.   Bimanual Exam:  Uterus:  normal size, contour, position, consistency, mobility, non-tender              Adnexa: normal adnexa and no mass, fullness, tenderness               Rectovaginal: Confirms               Anus:  normal sphincter tone, no lesions  Chaperone, Louisa Second, RN, was present for exam.  Assessment/Plan: 1. Encntr for gyn exam (general) (routine) w/o abn findings - Order for MMG placed - Cytology - PAP( [place]) - colorectal cancer screening discussed - vaccines reviewed - lab work done with PCP  2. Screen for STD (sexually transmitted disease) - Hepatitis C Antibody - RPR  3.  Essential hypertension 4.  Bipolar d/o 5.  H/o Seizures 6.  Gallstones  38 minutes of total time was spent for this patient encounter, including preparation, face-to-face counseling with the patient and coordination of care, and documentation of the encounter.

## 2020-09-17 ENCOUNTER — Telehealth: Payer: Self-pay

## 2020-09-17 LAB — HEPATITIS C ANTIBODY: Hep C Virus Ab: <0.1 s/co ratio (ref 0.0–0.9)

## 2020-09-17 LAB — RPR: RPR Ser Ql: NONREACTIVE

## 2020-09-17 NOTE — Telephone Encounter (Signed)
-----   Message from Megan Salon, MD sent at 09/17/2020  6:33 AM EST ----- Please let pt know her hep C and RPR were negative.  Thank you.  She is not on Epic.

## 2020-09-17 NOTE — Telephone Encounter (Signed)
Spoke with pt. Pt given results per Dr Sabra Heck. Pt agreeable and verbalized understanding.  Encounter closed

## 2020-09-21 LAB — CYTOLOGY - PAP
Comment: NEGATIVE
Diagnosis: NEGATIVE
High risk HPV: NEGATIVE

## 2020-11-06 ENCOUNTER — Encounter (HOSPITAL_COMMUNITY): Payer: Self-pay | Admitting: Emergency Medicine

## 2020-11-06 ENCOUNTER — Emergency Department (HOSPITAL_COMMUNITY)
Admission: EM | Admit: 2020-11-06 | Discharge: 2020-11-06 | Disposition: A | Discharge status: Home / Self Care | Payer: Medicare Other | Attending: Emergency Medicine | Admitting: Emergency Medicine

## 2020-11-06 DIAGNOSIS — I10 Essential (primary) hypertension: Secondary | ICD-10-CM | POA: Diagnosis not present

## 2020-11-06 DIAGNOSIS — R519 Headache, unspecified: Secondary | ICD-10-CM | POA: Diagnosis not present

## 2020-11-06 DIAGNOSIS — R110 Nausea: Secondary | ICD-10-CM | POA: Diagnosis not present

## 2020-11-06 DIAGNOSIS — Z7951 Long term (current) use of inhaled steroids: Secondary | ICD-10-CM | POA: Diagnosis not present

## 2020-11-06 DIAGNOSIS — J45909 Unspecified asthma, uncomplicated: Secondary | ICD-10-CM | POA: Insufficient documentation

## 2020-11-06 DIAGNOSIS — M791 Myalgia, unspecified site: Secondary | ICD-10-CM | POA: Insufficient documentation

## 2020-11-06 DIAGNOSIS — R197 Diarrhea, unspecified: Secondary | ICD-10-CM | POA: Diagnosis not present

## 2020-11-06 DIAGNOSIS — R531 Weakness: Secondary | ICD-10-CM | POA: Diagnosis not present

## 2020-11-06 DIAGNOSIS — Z20822 Contact with and (suspected) exposure to covid-19: Secondary | ICD-10-CM | POA: Diagnosis not present

## 2020-11-06 DIAGNOSIS — Z87891 Personal history of nicotine dependence: Secondary | ICD-10-CM | POA: Diagnosis not present

## 2020-11-06 LAB — URINALYSIS, ROUTINE W REFLEX MICROSCOPIC
Bilirubin Urine: NEGATIVE
Glucose, UA: NEGATIVE mg/dL
Hgb urine dipstick: NEGATIVE
Ketones, ur: NEGATIVE mg/dL
Leukocytes,Ua: NEGATIVE
Nitrite: NEGATIVE
Protein, ur: NEGATIVE mg/dL
Specific Gravity, Urine: 1.01 (ref 1.005–1.030)
pH: 5 (ref 5.0–8.0)

## 2020-11-06 LAB — COMPREHENSIVE METABOLIC PANEL
ALT: 14 U/L (ref 0–44)
AST: 17 U/L (ref 15–41)
Albumin: 4 g/dL (ref 3.5–5.0)
Alkaline Phosphatase: 70 U/L (ref 38–126)
Anion gap: 10 (ref 5–15)
BUN: 8 mg/dL (ref 6–20)
CO2: 26 mmol/L (ref 22–32)
Calcium: 9 mg/dL (ref 8.9–10.3)
Chloride: 105 mmol/L (ref 98–111)
Creatinine, Ser: 0.79 mg/dL (ref 0.44–1.00)
GFR, Estimated: >60 mL/min (ref >60)
Glucose, Bld: 124 mg/dL — ABNORMAL HIGH (ref 70–99)
Potassium: 3.7 mmol/L (ref 3.5–5.1)
Sodium: 141 mmol/L (ref 135–145)
Total Bilirubin: 0.5 mg/dL (ref 0.3–1.2)
Total Protein: 7.5 g/dL (ref 6.5–8.1)

## 2020-11-06 LAB — SARS CORONAVIRUS 2 (TAT 6-24 HRS): SARS Coronavirus 2: NEGATIVE

## 2020-11-06 LAB — CBC WITH DIFFERENTIAL/PLATELET
Abs Immature Granulocytes: 0.02 10*3/uL (ref 0.00–0.07)
Basophils Absolute: 0 10*3/uL (ref 0.0–0.1)
Basophils Relative: 0 %
Eosinophils Absolute: 0.2 10*3/uL (ref 0.0–0.5)
Eosinophils Relative: 4 %
HCT: 36.2 % (ref 36.0–46.0)
Hemoglobin: 11.5 g/dL — ABNORMAL LOW (ref 12.0–15.0)
Immature Granulocytes: 0 %
Lymphocytes Relative: 32 %
Lymphs Abs: 1.8 10*3/uL (ref 0.7–4.0)
MCH: 29.4 pg (ref 26.0–34.0)
MCHC: 31.8 g/dL (ref 30.0–36.0)
MCV: 92.6 fL (ref 80.0–100.0)
Monocytes Absolute: 0.3 10*3/uL (ref 0.1–1.0)
Monocytes Relative: 5 %
Neutro Abs: 3.2 10*3/uL (ref 1.7–7.7)
Neutrophils Relative %: 59 %
Platelets: 246 10*3/uL (ref 150–400)
RBC: 3.91 MIL/uL (ref 3.87–5.11)
RDW: 12.9 % (ref 11.5–15.5)
WBC: 5.5 10*3/uL (ref 4.0–10.5)
nRBC: 0 % (ref 0.0–0.2)

## 2020-11-06 LAB — I-STAT BETA HCG BLOOD, ED (MC, WL, AP ONLY): I-stat hCG, quantitative: <5 m[IU]/mL (ref <5)

## 2020-11-06 LAB — LIPASE, BLOOD: Lipase: 27 U/L (ref 11–51)

## 2020-11-06 MED ORDER — ALBUTEROL SULFATE HFA 108 (90 BASE) MCG/ACT IN AERS: 2.0000 | INHALATION_SPRAY | Freq: Four times a day (QID) | RESPIRATORY_TRACT | 0 refills | Status: DC | PRN

## 2020-11-06 MED ORDER — PROCHLORPERAZINE EDISYLATE 10 MG/2ML IJ SOLN
10.0000 mg | Freq: Once | INTRAMUSCULAR | Status: AC
  Administered 2020-11-06: 10 mg via INTRAVENOUS
  Filled 2020-11-06: qty 2

## 2020-11-06 MED ORDER — DIPHENHYDRAMINE HCL 50 MG/ML IJ SOLN
12.5000 mg | Freq: Once | INTRAMUSCULAR | Status: AC
  Administered 2020-11-06: 12.5 mg via INTRAVENOUS
  Filled 2020-11-06: qty 1

## 2020-11-06 MED ORDER — ONDANSETRON 4 MG PO TBDP: 4.0000 mg | ORAL_TABLET | Freq: Once | ORAL | Status: DC

## 2020-11-06 MED ORDER — ONDANSETRON 4 MG PO TBDP: 4.0000 mg | ORAL_TABLET | Freq: Three times a day (TID) | ORAL | 0 refills | Status: DC | PRN

## 2020-11-06 NOTE — ED Provider Notes (Signed)
[place] DEPT Provider Note   CSN: [id number] Arrival date & time: 11/06/20  0631     History Chief Complaint  Patient presents with  . Nausea  . Diarrhea  . Generalized Body Aches    [name] is a 49 y.o. female with a past medical history significant for anxiety, asthma, bipolar 1 disorder, depression, hypertension, seizures, and schizophrenia who presents to the ED due to nausea, diarrhea, generalized weakness, myalgias, and headaches x 2-3 days. Patient denies sick contacts and known COVID exposures. She has received both her COVID-19 vaccines and her booster shot. She admits to nausea after any po intake with mild diffuse abdominal pain and a few episodes of non-bloody diarrhea. Denies associated emesis. She also endorses a throbbing posterior headache that occasionally migrates to left side.  Denies head injury.  Denies visual changes, speech changes, unilateral weakness, photophobia, and phonophobia.  Patient notes she has a history of headaches which feels similar. No treatment prior to arrival. Denies chest pain, shortness of breath, cough, and sore throat. No aggravating or alleviating symptoms. Denies fever and chills.  History obtained from patient and past medical records. No interpreter used during encounter.      Past Medical History:  Diagnosis Date  . Anxiety   . Asthma   . Bipolar 1 disorder ([place])   . Depression   . Gallstones 08/2020  . Hypertension   . Insomnia, persistent   . Prediabetes   . Schizophrenic disorder ([place])   . Seizures [facility])     Patient Active Problem List   Diagnosis Date Noted  . Bipolar disorder, manic ([place]) 02/02/2020  . Encounter for medical clearance for patient hold   . Prediabetes 08/26/2015  . Essential hypertension 08/26/2015  . Seizures ([place]) 08/26/2015  . Noncompliance with treatment 07/16/2015  . Schizoaffective disorder, bipolar type ([place]) 01/11/2015    Past Surgical History:   Procedure Laterality Date  . BREAST LUMPECTOMY Left 07/2013  . TONSILLECTOMY    . TUBAL LIGATION  2002     OB History    Gravida  2   Para  2   Term  2   Preterm      AB      Living  2     SAB      IAB      Ectopic      Multiple      Live Births  2           Family History  Problem Relation Age of Onset  . Depression Mother   . Gout Mother   . Cancer Father        prostate  . Other Father        lung issue  . Alcoholism Other   . Heart attack Paternal Grandfather   . Heart attack Paternal Grandmother   . Heart attack Maternal Grandmother   . Heart attack Maternal Grandfather   . Depression Son   . Anxiety disorder Son     Social History   Tobacco Use  . Smoking status: Former Smoker    Types: Cigarettes  . Smokeless tobacco: Never Used  Vaping Use  . Vaping Use: Never used  Substance Use Topics  . Alcohol use: Not Currently  . Drug use: No    Home Medications Prior to Admission medications   Medication Sig Start Date End Date Taking? Authorizing Provider  albuterol (VENTOLIN HFA) 108 (90 Base) MCG/ACT inhaler Inhale 2 puffs into the lungs  every 6 (six) hours as needed for wheezing or shortness of breath. 11/06/20  Yes Chai Verdejo C, PA-C  ondansetron (ZOFRAN ODT) 4 MG disintegrating tablet Take 1 tablet (4 mg total) by mouth every 8 (eight) hours as needed for nausea or vomiting. 11/06/20  Yes Mylik Pro, Druscilla Brownie, PA-C  albuterol (VENTOLIN HFA) 108 (90 Base) MCG/ACT inhaler Inhale 2 puffs into the lungs every 6 (six) hours as needed for wheezing or shortness of breath. 02/06/20   Clapacs, [name], MD  benzonatate (TESSALON) 100 MG capsule Take 1 capsule (100 mg total) by mouth every 8 (eight) hours. Patient not taking: Reported on 09/16/2020 02/28/20   Tedd Sias, PA  benztropine (COGENTIN) 0.5 MG tablet Take 1 tablet (0.5 mg total) by mouth at bedtime. 02/06/20   Clapacs, [name], MD  budesonide-formoterol (SYMBICORT) 160-4.5 MCG/ACT  inhaler Inhale 2 puffs into the lungs 2 (two) times daily. For Shortness of breath Patient not taking: Reported on 09/16/2020 02/06/20   Clapacs, [name], MD  divalproex (DEPAKOTE) 500 MG DR tablet Take 1 tablet (500 mg total) by mouth every 12 (twelve) hours. Patient taking differently: Take 1,000 mg by mouth at bedtime. 02/06/20   Clapacs, [name], MD  DM-Phenylephrine-Acetaminophen (VICKS DAYQUIL COLD & FLU) 10-5-325 MG/15ML LIQD Take 15 mLs by mouth as needed (congestion).    [provider]  fluticasone (FLONASE) 50 MCG/ACT nasal spray Place 2 sprays into both nostrils daily for 14 days. 02/28/20 03/13/20  Tedd Sias, PA  fluticasone (FLOVENT HFA) 44 MCG/ACT inhaler Inhale 2 puffs into the lungs 2 (two) times daily. For SOB 02/06/20   Clapacs, [name], MD  ibuprofen (ADVIL) 200 MG tablet Take 200 mg by mouth every 6 (six) hours as needed.    [provider]  paliperidone (INVEGA SUSTENNA) 234 MG/1.5ML SUSY injection Inject 234 mg into the muscle once for 1 dose. 02/06/20 03/01/20  Clapacs, [name], MD  traZODone (DESYREL) 100 MG tablet Take 100 mg by mouth at bedtime. 02/19/20   [provider]  ferrous sulfate 325 (65 FE) MG tablet Take 1 tablet (325 mg total) by mouth daily with breakfast. Patient not taking: No sig reported 12/24/18 04/20/19  [name], MD    Allergies    Haldol [haloperidol decanoate], Penicillins, Pollen extract, and Shrimp [shellfish allergy]  Review of Systems   Review of Systems  Constitutional: Negative for chills and fever.  HENT: Positive for congestion. Negative for sore throat.   Eyes: Negative for photophobia and visual disturbance.  Respiratory: Negative for cough and shortness of breath.   Cardiovascular: Negative for chest pain.  Gastrointestinal: Positive for abdominal pain, diarrhea and nausea. Negative for vomiting.  Genitourinary: Negative for dysuria and vaginal discharge.  Neurological: Positive for headaches. Negative for  dizziness, facial asymmetry and weakness.  All other systems reviewed and are negative.   Physical Exam Updated Vital Signs BP (!) 151/94 (BP Location: Left Arm)   Pulse 71   Temp 98.3 F (36.8 C) (Oral)   Resp 18   SpO2 100%   Physical Exam Vitals and nursing note reviewed.  Constitutional:      General: She is not in acute distress. HENT:     Head: Normocephalic.  Eyes:     Pupils: Pupils are equal, round, and reactive to light.  Cardiovascular:     Rate and Rhythm: Normal rate and regular rhythm.     Pulses: Normal pulses.     Heart sounds: Normal heart sounds. No  murmur heard. No friction rub. No gallop.   Pulmonary:     Effort: Pulmonary effort is normal.     Breath sounds: Normal breath sounds.  Abdominal:     General: Abdomen is flat. Bowel sounds are normal. There is no distension.     Palpations: Abdomen is soft.     Tenderness: There is no abdominal tenderness. There is no guarding or rebound.     Comments: Abdomen soft, nondistended, nontender to palpation in all quadrants without guarding or peritoneal signs. No rebound.   Musculoskeletal:        General: Normal range of motion.     Cervical back: Neck supple.  Skin:    General: Skin is warm and dry.  Neurological:     General: No focal deficit present.     Comments: Speech is clear, able to follow commands CN III-XII intact Normal strength in upper and lower extremities bilaterally including dorsiflexion and plantar flexion, strong and equal grip strength Sensation grossly intact throughout Moves extremities without ataxia, coordination intact No pronator drift  Psychiatric:        Mood and Affect: Mood normal.        Behavior: Behavior normal.     ED Results / Procedures / Treatments   Labs (all labs ordered are listed, but only abnormal results are displayed) Labs Reviewed  CBC WITH DIFFERENTIAL/PLATELET - Abnormal; Notable for the following components:      Result Value   Hemoglobin 11.5 (*)     All other components within normal limits  COMPREHENSIVE METABOLIC PANEL - Abnormal; Notable for the following components:   Glucose, Bld 124 (*)    All other components within normal limits  URINALYSIS, ROUTINE W REFLEX MICROSCOPIC - Abnormal; Notable for the following components:   Bacteria, UA RARE (*)    All other components within normal limits  SARS CORONAVIRUS 2 (TAT 6-24 HRS)  LIPASE, BLOOD  I-STAT BETA HCG BLOOD, ED ([facility], [facility], [facility] ONLY)    EKG None  Radiology No results found.  Procedures Procedures   Medications Ordered in ED Medications  diphenhydrAMINE (BENADRYL) injection 12.5 mg (12.5 mg Intravenous Given 11/06/20 0753)  prochlorperazine (COMPAZINE) injection 10 mg (10 mg Intravenous Given 11/06/20 0754)    ED Course  I have reviewed the triage vital signs and the nursing notes.  Pertinent labs & imaging results that were available during my care of the patient were reviewed by me and considered in my medical decision making (see chart for details).  Clinical Course as of 11/06/20 0934  Sat Nov 06, 2020  0716 I-stat hCG, quantitative: <5.0 [CA]  0806 Glucose(!): 124 [CA]    Clinical Course User Index [CA] [name], PA-C   MDM Rules/Calculators/A&P                         50 year old female presents to the ED due to nausea, diarrhea, headache, generalized weakness, and myalgias. No chest pain or shortness of breath. No fever or chills. Patient denies sick contacts and known COVID exposures. She has received both her COVID-19 vaccines and booster shot.  Upon arrival, patient afebrile, not tachycardic or hypoxic.  Patient in no acute distress and non-ill-appearing.  Physical exam reassuring.  Abdomen soft, nondistended, nontender.  Doubt acute abdomen.  Normal neurological exam. Doubt SAH or other emergent causes of headache. Suspect symptoms related to viral etiology.  Covid test ordered to rule out infection.  Routine labs to  rule out electrolyte  derangements. Headache and nausea treated with compazine and benadryl.   CBC reassuring with no leukocytosis.  Mild anemia with hemoglobin 11.5 which appears to be around patient's baseline.  Lipase normal at 27.  Doubt pancreatitis.  Pregnancy test negative.  CMP significant for hyperglycemia at 124 with no anion gap.  Doubt DKA.  Normal renal function.  No major electrolyte derangements.  9:31 AM reassessed patient at bedside.  Patient admits to improvement in symptoms.  Patient notes headache has completely resolved.  Patient able to tolerate p.o. at bedside without difficulty.  Covid test pending.  Instructed patient to continue to self quarantine until results become available.  Suspect symptoms related to viral etiology.  Patient discharged with Zofran as needed for nausea.  Instructed patient take over-the-counter diarrhea medication as needed. Strict ED precautions discussed with patient. Patient states understanding and agrees to plan. Patient discharged home in no acute distress and stable vitals  Final Clinical Impression(s) / ED Diagnoses Final diagnoses:  Diarrhea, unspecified type  Nausea  Acute nonintractable headache, unspecified headache type    Rx / DC Orders ED Discharge Orders         Ordered    ondansetron (ZOFRAN ODT) 4 MG disintegrating tablet  Every 8 hours PRN        11/06/20 0934    albuterol (VENTOLIN HFA) 108 (90 Base) MCG/ACT inhaler  Every 6 hours PRN        11/06/20 0934           [name], PA-C 11/06/20 [id number]    Dorie Rank, MD 11/07/20 [phone]

## 2020-11-06 NOTE — ED Triage Notes (Signed)
Patient reports nausea and diarrhea for "couple of days". Generalized body aches. Reports covid immunizations.

## 2020-11-06 NOTE — Discharge Instructions (Addendum)
As discussed, all of your labs are reassuring today.  I suspect your symptoms are related to a viral infection.  Your Covid test is pending.  Continue to self quarantine until your results become available.  I am sending you home with nausea medication.  Take as needed for nausea.  You may take over-the-counter diarrhea medication as needed.  I have also refilled your albuterol.  Use as needed.  Please follow-up with PCP if symptoms not improve over the next few days.  Return to the ER for new or worsening symptoms

## 2020-11-24 ENCOUNTER — Encounter (HOSPITAL_COMMUNITY): Payer: Self-pay

## 2020-11-24 ENCOUNTER — Emergency Department (HOSPITAL_COMMUNITY)
Admission: EM | Admit: 2020-11-24 | Discharge: 2020-11-24 | Disposition: A | Discharge status: Home / Self Care | Payer: Medicare Other | Attending: Emergency Medicine | Admitting: Emergency Medicine

## 2020-11-24 DIAGNOSIS — R0602 Shortness of breath: Secondary | ICD-10-CM | POA: Diagnosis not present

## 2020-11-24 DIAGNOSIS — Z7951 Long term (current) use of inhaled steroids: Secondary | ICD-10-CM | POA: Insufficient documentation

## 2020-11-24 DIAGNOSIS — Z87891 Personal history of nicotine dependence: Secondary | ICD-10-CM | POA: Diagnosis not present

## 2020-11-24 DIAGNOSIS — J45909 Unspecified asthma, uncomplicated: Secondary | ICD-10-CM | POA: Diagnosis not present

## 2020-11-24 DIAGNOSIS — I10 Essential (primary) hypertension: Secondary | ICD-10-CM | POA: Diagnosis not present

## 2020-11-24 MED ORDER — ALBUTEROL SULFATE HFA 108 (90 BASE) MCG/ACT IN AERS
2.0000 | INHALATION_SPRAY | Freq: Four times a day (QID) | RESPIRATORY_TRACT | Status: DC
  Administered 2020-11-24: 2 via RESPIRATORY_TRACT
  Filled 2020-11-24: qty 6.7

## 2020-11-24 NOTE — ED Provider Notes (Signed)
[facility] DEPT Provider Note   CSN: [id number] Arrival date & time: 11/24/20  1356     History Chief Complaint  Patient presents with  . Shortness of Breath    [name] is a 50 y.o. female.  HPI Patient presents with concern of dyspnea.  Patient notes a history of asthma, as well as psychiatric disease.  Seemingly her psychiatric disease has been well controlled.  She currently, explicitly, denies hearing voices, having visual hallucination, suicidal or homicidal ideation.  With past day or so patient has had dyspnea, today after an episode of difficulty with obtaining her medication she became more anxious, and more dyspneic. There is no pain, syncope, or other changes. [name] symptoms have improved after she did obtain some of her typica inhaler.  No current pain, no current dyspnea.  No recent fever, cough.     Past Medical History:  Diagnosis Date  . Anxiety   . Asthma   . Bipolar 1 disorder ([place])   . Depression   . Gallstones 08/2020  . Hypertension   . Insomnia, persistent   . Prediabetes   . Schizophrenic disorder ([place])   . Seizures [facility])     Patient Active Problem List   Diagnosis Date Noted  . Bipolar disorder, manic ([place]) 02/02/2020  . Encounter for medical clearance for patient hold   . Prediabetes 08/26/2015  . Essential hypertension 08/26/2015  . Seizures ([place]) 08/26/2015  . Noncompliance with treatment 07/16/2015  . Schizoaffective disorder, bipolar type ([place]) 01/11/2015    Past Surgical History:  Procedure Laterality Date  . BREAST LUMPECTOMY Left 07/2013  . TONSILLECTOMY    . TUBAL LIGATION  2002     OB History    Gravida  2   Para  2   Term  2   Preterm      AB      Living  2     SAB      IAB      Ectopic      Multiple      Live Births  2           Family History  Problem Relation Age of Onset  . Depression Mother   . Gout Mother   . Cancer Father        prostate  . Other  Father        lung issue  . Alcoholism Other   . Heart attack Paternal Grandfather   . Heart attack Paternal Grandmother   . Heart attack Maternal Grandmother   . Heart attack Maternal Grandfather   . Depression Son   . Anxiety disorder Son     Social History   Tobacco Use  . Smoking status: Former Smoker    Types: Cigarettes  . Smokeless tobacco: Never Used  Vaping Use  . Vaping Use: Never used  Substance Use Topics  . Alcohol use: Not Currently  . Drug use: No    Home Medications Prior to Admission medications   Medication Sig Start Date End Date Taking? Authorizing Provider  albuterol (VENTOLIN HFA) 108 (90 Base) MCG/ACT inhaler Inhale 2 puffs into the lungs every 6 (six) hours as needed for wheezing or shortness of breath. 02/06/20   Clapacs, [name], MD  albuterol (VENTOLIN HFA) 108 (90 Base) MCG/ACT inhaler Inhale 2 puffs into the lungs every 6 (six) hours as needed for wheezing or shortness of breath. 11/06/20   [name], PA-C  benzonatate (TESSALON) 100 MG  capsule Take 1 capsule (100 mg total) by mouth every 8 (eight) hours. Patient not taking: Reported on 09/16/2020 02/28/20   Tedd Sias, PA  benztropine (COGENTIN) 0.5 MG tablet Take 1 tablet (0.5 mg total) by mouth at bedtime. 02/06/20   Clapacs, [name], MD  budesonide-formoterol (SYMBICORT) 160-4.5 MCG/ACT inhaler Inhale 2 puffs into the lungs 2 (two) times daily. For Shortness of breath Patient not taking: Reported on 09/16/2020 02/06/20   Clapacs, [name], MD  divalproex (DEPAKOTE) 500 MG DR tablet Take 1 tablet (500 mg total) by mouth every 12 (twelve) hours. Patient taking differently: Take 1,000 mg by mouth at bedtime. 02/06/20   Clapacs, [name], MD  DM-Phenylephrine-Acetaminophen (VICKS DAYQUIL COLD & FLU) 10-5-325 MG/15ML LIQD Take 15 mLs by mouth as needed (congestion).    [provider]  fluticasone (FLONASE) 50 MCG/ACT nasal spray Place 2 sprays into both nostrils daily for 14 days. 02/28/20  03/13/20  Tedd Sias, PA  fluticasone (FLOVENT HFA) 44 MCG/ACT inhaler Inhale 2 puffs into the lungs 2 (two) times daily. For SOB 02/06/20   Clapacs, [name], MD  ibuprofen (ADVIL) 200 MG tablet Take 200 mg by mouth every 6 (six) hours as needed.    [provider]  ondansetron (ZOFRAN ODT) 4 MG disintegrating tablet Take 1 tablet (4 mg total) by mouth every 8 (eight) hours as needed for nausea or vomiting. 11/06/20   [name], PA-C  paliperidone (INVEGA SUSTENNA) 234 MG/1.5ML SUSY injection Inject 234 mg into the muscle once for 1 dose. 02/06/20 03/01/20  Clapacs, [name], MD  traZODone (DESYREL) 100 MG tablet Take 100 mg by mouth at bedtime. 02/19/20   [provider]  ferrous sulfate 325 (65 FE) MG tablet Take 1 tablet (325 mg total) by mouth daily with breakfast. Patient not taking: No sig reported 12/24/18 04/20/19  [name], MD    Allergies    Haldol [haloperidol decanoate], Penicillins, Pollen extract, and Shrimp [shellfish allergy]  Review of Systems   Review of Systems  Constitutional:       Per HPI, otherwise negative  HENT:       Per HPI, otherwise negative  Respiratory:       Per HPI, otherwise negative  Cardiovascular:       Per HPI, otherwise negative  Gastrointestinal: Negative for vomiting.  Endocrine:       Negative aside from HPI  Genitourinary:       Neg aside from HPI   Musculoskeletal:       Per HPI, otherwise negative  Skin: Negative.   Neurological: Negative for syncope.    Physical Exam Updated Vital Signs BP 132/87 (BP Location: Right Arm)   Pulse 68   Temp 98.2 F (36.8 C) (Oral)   Resp 20   SpO2 100%   Physical Exam Vitals and nursing note reviewed.  Constitutional:      General: She is not in acute distress.    Appearance: She is well-developed.  HENT:     Head: Normocephalic and atraumatic.  Eyes:     Conjunctiva/sclera: Conjunctivae normal.  Cardiovascular:     Rate and Rhythm: Normal rate and regular  rhythm.  Pulmonary:     Effort: Pulmonary effort is normal. No respiratory distress.     Breath sounds: No stridor. Decreased breath sounds present. No wheezing.  Abdominal:     General: There is no distension.  Skin:    General: Skin is warm and dry.  Neurological:  Mental Status: She is alert and oriented to person, place, and time.     Cranial Nerves: No cranial nerve deficit.  Psychiatric:     Comments: Slightly slowed, but pleasantly interactive     ED Results / Procedures / Treatments    Procedures Procedures   Medications Ordered in ED Medications  albuterol (VENTOLIN HFA) 108 (90 Base) MCG/ACT inhaler 2 puff (has no administration in time range)    ED Course  I have reviewed the triage vital signs and the nursing notes.  Pertinent labs & imaging results that were available during my care of the patient were reviewed by me and considered in my medical decision making (see chart for details).   Oximetry 100% room air normal  MDM Rules/Calculators/A&P Adult female with schizophrenia, asthma presents with shortness of breath.  Some suspicion for unsettling event contributing to her symptoms, is here there is no fever, no ongoing dyspnea, no pain, no hypoxia patient has improved substantially, has negligible ongoing complaints.  Patient provided her type of inhaler, discharged in stable condition to follow-up with your physician. Final Clinical Impression(s) / ED Diagnoses Final diagnoses:  Shortness of breath    Rx / DC Orders ED Discharge Orders    None       Carmin Muskrat, MD 11/24/20 1626

## 2020-11-24 NOTE — ED Triage Notes (Signed)
Pt arrived via EMS, states SOB x3 days due to not having her inhaler. Per Gard[facility] Rhyme they started pt was delivered inhaler as normal.

## 2020-11-24 NOTE — Discharge Instructions (Addendum)
As discussed, your evaluation today has been largely reassuring.  But, it is important that you monitor your condition carefully, and do not hesitate to return to the ED if you develop new, or concerning changes in your condition. ? ?Otherwise, please follow-up with your physician for appropriate ongoing care. ? ?

## 2021-01-04 ENCOUNTER — Other Ambulatory Visit: Payer: Self-pay | Admitting: Family

## 2021-01-04 DIAGNOSIS — Z1231 Encounter for screening mammogram for malignant neoplasm of breast: Secondary | ICD-10-CM

## 2021-01-05 ENCOUNTER — Other Ambulatory Visit: Payer: Self-pay

## 2021-01-05 ENCOUNTER — Ambulatory Visit
Admission: RE | Admit: 2021-01-05 | Discharge: 2021-01-05 | Disposition: A | Discharge status: Home / Self Care | Payer: 59 | Source: Ambulatory Visit | Attending: Family | Admitting: Family

## 2021-01-05 DIAGNOSIS — Z1231 Encounter for screening mammogram for malignant neoplasm of breast: Secondary | ICD-10-CM

## 2021-02-02 ENCOUNTER — Ambulatory Visit (HOSPITAL_COMMUNITY)
Admission: AD | Admit: 2021-02-02 | Discharge: 2021-02-02 | Disposition: A | Discharge status: Home / Self Care | Payer: 59 | Attending: Psychiatry | Admitting: Psychiatry

## 2021-02-02 DIAGNOSIS — F250 Schizoaffective disorder, bipolar type: Secondary | ICD-10-CM | POA: Diagnosis present

## 2021-02-02 NOTE — H&P (Signed)
Behavioral Health Medical Screening Exam  [name] is a 50 y.o. female with a history of schizoaffective disorder, bipolar type who presents to [facility] voluntarily due to anxiety. On evaluation patient is alert and oriented x 4, pleasant, and cooperative. Speech is clear and coherent. Patient states "I have been real stressed. I worry about my son. He is about to start college. I don't know why, but I just worry about him."  Mood is anxious and affect is congruent with mood. Thought process is coherent and thought content is logical. Denies auditory and visual hallucinations. No indication that patient is responding to internal stimuli. No evidence of delusional thought content. Reports passive suicidal ideation at times. Denies intent or plan. Denies homicidal ideations. Denies substance abuse. Patient states that she is followed by [facility]. States that she received [place] injection last week. States her only other medication is trazodone. Patient states that she feels safe returning home.   Total Time spent with patient: 20 minutes  Psychiatric Specialty Exam:  Presentation  General Appearance: Casual  Eye Contact:Good  Speech:Clear and Coherent; Normal Rate  Speech Volume:Normal  Handedness:Right   Mood and Affect  Mood:Anxious; Depressed  Affect:Congruent   Thought Process  Thought Processes:Coherent; Linear  Descriptions of Associations:Intact  Orientation:Full (Time, Place and Person)  Thought Content:Logical  History of Schizophrenia/Schizoaffective disorder:No data recorded Duration of Psychotic Symptoms:No data recorded Hallucinations:Hallucinations: None  Ideas of Reference:None  Suicidal Thoughts:Suicidal Thoughts: Yes, Passive SI Passive Intent and/or Plan: Without Intent; Without Plan  Homicidal Thoughts:Homicidal Thoughts: No   Sensorium  Memory:Immediate Good; Recent Good; Remote Good  Judgment:Good  Insight:Fair   Executive Functions   Concentration:Fair  Attention Span:Fair  [place]  Language:Good   Psychomotor Activity  Psychomotor Activity:Psychomotor Activity: Normal   Assets  Assets:Communication Skills; Desire for Improvement; Financial Resources/Insurance; Housing; Physical Health; Social Support   Sleep  Sleep:Sleep: Fair    Physical Exam: Physical Exam Constitutional:      General: She is not in acute distress.    Appearance: She is not ill-appearing, toxic-appearing or diaphoretic.  HENT:     Right Ear: External ear normal.     Left Ear: External ear normal.  Eyes:     Pupils: Pupils are equal, round, and reactive to light.  Cardiovascular:     Rate and Rhythm: Normal rate.  Pulmonary:     Effort: Pulmonary effort is normal. No respiratory distress.  Musculoskeletal:        General: Normal range of motion.  Neurological:     Mental Status: She is alert and oriented to person, place, and time.  Psychiatric:        Speech: Speech normal.        Behavior: Behavior is cooperative.    Review of Systems  Constitutional: Negative for chills, diaphoresis, fever, malaise/fatigue and weight loss.  HENT: Negative for congestion.   Respiratory: Negative for cough and shortness of breath.   Cardiovascular: Negative for chest pain and palpitations.  Gastrointestinal: Negative for diarrhea, nausea and vomiting.  Neurological: Negative for dizziness and seizures.  Psychiatric/Behavioral: Positive for depression and suicidal ideas. Negative for hallucinations, memory loss and substance abuse. The patient is nervous/anxious and has insomnia.   All other systems reviewed and are negative.  Blood pressure (!) 123/91, pulse 93, temperature 98.6 F (37 C), temperature source Oral, SpO2 99 %. There is no height or weight on file to calculate BMI.  Musculoskeletal: Strength & Muscle Tone: within normal limits Gait &  Station: normal Patient leans:  N/A   Recommendations:  Based on my evaluation the patient does not appear to have an emergency medical condition.  Rozetta Nunnery, NP 02/02/2021, 3:50 AM
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
## 2021-02-05 ENCOUNTER — Emergency Department (HOSPITAL_COMMUNITY): Payer: 59

## 2021-02-05 ENCOUNTER — Encounter (HOSPITAL_COMMUNITY): Payer: Self-pay | Admitting: Emergency Medicine

## 2021-02-05 ENCOUNTER — Emergency Department (HOSPITAL_COMMUNITY)
Admission: EM | Admit: 2021-02-05 | Discharge: 2021-02-05 | Disposition: A | Discharge status: Home / Self Care | Payer: 59 | Attending: Emergency Medicine | Admitting: Emergency Medicine

## 2021-02-05 DIAGNOSIS — J45909 Unspecified asthma, uncomplicated: Secondary | ICD-10-CM | POA: Insufficient documentation

## 2021-02-05 DIAGNOSIS — R519 Headache, unspecified: Secondary | ICD-10-CM | POA: Diagnosis not present

## 2021-02-05 DIAGNOSIS — R0602 Shortness of breath: Secondary | ICD-10-CM | POA: Diagnosis not present

## 2021-02-05 DIAGNOSIS — Z5321 Procedure and treatment not carried out due to patient leaving prior to being seen by health care provider: Secondary | ICD-10-CM | POA: Insufficient documentation

## 2021-02-05 MED ORDER — ALBUTEROL SULFATE HFA 108 (90 BASE) MCG/ACT IN AERS: 2.0000 | INHALATION_SPRAY | RESPIRATORY_TRACT | Status: DC | PRN

## 2021-02-05 NOTE — ED Notes (Signed)
Pt stated that she wanted to leave and go home. She wasn't going to wait anymore. Pt was encouraged to stay by this tech, however, she still made the decision to no longer wait and leave.

## 2021-02-05 NOTE — ED Triage Notes (Signed)
Pt endorses SOB today. Endorses HA, hx of asthma.

## 2021-02-23 ENCOUNTER — Other Ambulatory Visit: Payer: Self-pay

## 2021-02-23 ENCOUNTER — Ambulatory Visit (HOSPITAL_COMMUNITY)
Admission: EM | Admit: 2021-02-23 | Discharge: 2021-02-23 | Disposition: A | Discharge status: Home / Self Care | Payer: 59 | Attending: Behavioral Health | Admitting: Behavioral Health

## 2021-02-23 DIAGNOSIS — Z733 Stress, not elsewhere classified: Secondary | ICD-10-CM | POA: Diagnosis not present

## 2021-02-23 DIAGNOSIS — R45851 Suicidal ideations: Secondary | ICD-10-CM | POA: Insufficient documentation

## 2021-02-23 DIAGNOSIS — F250 Schizoaffective disorder, bipolar type: Secondary | ICD-10-CM | POA: Insufficient documentation

## 2021-02-23 DIAGNOSIS — Z597 Insufficient social insurance and welfare support: Secondary | ICD-10-CM | POA: Diagnosis not present

## 2021-02-23 NOTE — Discharge Instructions (Addendum)

## 2021-02-23 NOTE — ED Notes (Signed)
Safe transportation called for transport home

## 2021-02-23 NOTE — ED Notes (Signed)
Educated on d/c instructions. Verbalized understanding. Ambulated to retrieve belongings. No SI, HI, or AVH noted. No s/s pain, discomfort, or acute distress noted. A&O x4. Escorted to back [place] to safe transport for transportation home. Stable at time of d/c
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
## 2021-03-17 ENCOUNTER — Ambulatory Visit (HOSPITAL_COMMUNITY): Admission: AD | Admit: 2021-03-17 | Payer: Medicare HMO | Source: Home / Self Care | Admitting: Psychiatry

## 2021-03-17 ENCOUNTER — Ambulatory Visit (HOSPITAL_COMMUNITY)
Admission: EM | Admit: 2021-03-17 | Discharge: 2021-03-18 | Disposition: A | Discharge status: Home / Self Care | Payer: Medicare HMO | Attending: Psychiatry | Admitting: Psychiatry

## 2021-03-17 ENCOUNTER — Encounter (HOSPITAL_COMMUNITY): Payer: Self-pay | Admitting: Emergency Medicine

## 2021-03-17 DIAGNOSIS — R69 Illness, unspecified: Secondary | ICD-10-CM | POA: Diagnosis not present

## 2021-03-17 DIAGNOSIS — Z8659 Personal history of other mental and behavioral disorders: Secondary | ICD-10-CM | POA: Insufficient documentation

## 2021-03-17 DIAGNOSIS — Z20822 Contact with and (suspected) exposure to covid-19: Secondary | ICD-10-CM | POA: Insufficient documentation

## 2021-03-17 DIAGNOSIS — F250 Schizoaffective disorder, bipolar type: Secondary | ICD-10-CM | POA: Diagnosis present

## 2021-03-17 DIAGNOSIS — Z87891 Personal history of nicotine dependence: Secondary | ICD-10-CM | POA: Insufficient documentation

## 2021-03-17 DIAGNOSIS — Z6372 Alcoholism and drug addiction in family: Secondary | ICD-10-CM | POA: Insufficient documentation

## 2021-03-17 LAB — COMPREHENSIVE METABOLIC PANEL
ALT: 16 U/L (ref 0–44)
AST: 15 U/L (ref 15–41)
Albumin: 4 g/dL (ref 3.5–5.0)
Alkaline Phosphatase: 77 U/L (ref 38–126)
Anion gap: 11 (ref 5–15)
BUN: 8 mg/dL (ref 6–20)
CO2: 25 mmol/L (ref 22–32)
Calcium: 9.6 mg/dL (ref 8.9–10.3)
Chloride: 104 mmol/L (ref 98–111)
Creatinine, Ser: 0.85 mg/dL (ref 0.44–1.00)
GFR, Estimated: >60 mL/min (ref >60)
Glucose, Bld: 105 mg/dL — ABNORMAL HIGH (ref 70–99)
Potassium: 3.8 mmol/L (ref 3.5–5.1)
Sodium: 140 mmol/L (ref 135–145)
Total Bilirubin: 0.9 mg/dL (ref 0.3–1.2)
Total Protein: 7.7 g/dL (ref 6.5–8.1)

## 2021-03-17 LAB — TSH: TSH: 2.003 u[IU]/mL (ref 0.350–4.500)

## 2021-03-17 LAB — HEMOGLOBIN A1C
Hgb A1c MFr Bld: 5.8 % — ABNORMAL HIGH (ref 4.8–5.6)
Mean Plasma Glucose: 119.76 mg/dL

## 2021-03-17 LAB — CBC WITH DIFFERENTIAL/PLATELET
Abs Immature Granulocytes: 0.01 10*3/uL (ref 0.00–0.07)
Basophils Absolute: 0 10*3/uL (ref 0.0–0.1)
Basophils Relative: 1 %
Eosinophils Absolute: 0.3 10*3/uL (ref 0.0–0.5)
Eosinophils Relative: 5 %
HCT: 37.1 % (ref 36.0–46.0)
Hemoglobin: 12 g/dL (ref 12.0–15.0)
Immature Granulocytes: 0 %
Lymphocytes Relative: 37 %
Lymphs Abs: 2.1 10*3/uL (ref 0.7–4.0)
MCH: 28.8 pg (ref 26.0–34.0)
MCHC: 32.3 g/dL (ref 30.0–36.0)
MCV: 89 fL (ref 80.0–100.0)
Monocytes Absolute: 0.4 10*3/uL (ref 0.1–1.0)
Monocytes Relative: 7 %
Neutro Abs: 2.9 10*3/uL (ref 1.7–7.7)
Neutrophils Relative %: 50 %
Platelets: 282 10*3/uL (ref 150–400)
RBC: 4.17 MIL/uL (ref 3.87–5.11)
RDW: 13.5 % (ref 11.5–15.5)
WBC: 5.7 10*3/uL (ref 4.0–10.5)
nRBC: 0 % (ref 0.0–0.2)

## 2021-03-17 LAB — RESP PANEL BY RT-PCR (FLU A&B, COVID) ARPGX2
Influenza A by PCR: NEGATIVE
Influenza B by PCR: NEGATIVE
SARS Coronavirus 2 by RT PCR: NEGATIVE

## 2021-03-17 LAB — POCT URINE DRUG SCREEN - MANUAL ENTRY (I-SCREEN)
POC Amphetamine UR: NOT DETECTED
POC Buprenorphine (BUP): NOT DETECTED
POC Cocaine UR: NOT DETECTED
POC Marijuana UR: NOT DETECTED
POC Methadone UR: NOT DETECTED
POC Methamphetamine UR: NOT DETECTED
POC Morphine: POSITIVE — AB
POC Oxazepam (BZO): NOT DETECTED
POC Oxycodone UR: NOT DETECTED
POC Secobarbital (BAR): NOT DETECTED

## 2021-03-17 LAB — POC SARS CORONAVIRUS 2 AG -  ED: SARS Coronavirus 2 Ag: NEGATIVE

## 2021-03-17 LAB — LIPID PANEL
Cholesterol: 205 mg/dL — ABNORMAL HIGH (ref 0–200)
HDL: 37 mg/dL — ABNORMAL LOW (ref >40)
LDL Cholesterol: 143 mg/dL — ABNORMAL HIGH (ref 0–99)
Total CHOL/HDL Ratio: 5.5 RATIO
Triglycerides: 127 mg/dL (ref <150)
VLDL: 25 mg/dL (ref 0–40)

## 2021-03-17 LAB — POCT PREGNANCY, URINE: Preg Test, Ur: NEGATIVE

## 2021-03-17 MED ORDER — ATORVASTATIN CALCIUM 10 MG PO TABS
10.0000 mg | ORAL_TABLET | Freq: Every day | ORAL | Status: DC
  Administered 2021-03-17: 10 mg via ORAL
  Filled 2021-03-17: qty 1

## 2021-03-17 MED ORDER — ALUM & MAG HYDROXIDE-SIMETH 200-200-20 MG/5ML PO SUSP: 30.0000 mL | ORAL | Status: DC | PRN

## 2021-03-17 MED ORDER — MAGNESIUM HYDROXIDE 400 MG/5ML PO SUSP: 30.0000 mL | Freq: Every day | ORAL | Status: DC | PRN

## 2021-03-17 MED ORDER — ALBUTEROL SULFATE HFA 108 (90 BASE) MCG/ACT IN AERS: 2.0000 | INHALATION_SPRAY | Freq: Four times a day (QID) | RESPIRATORY_TRACT | Status: DC | PRN

## 2021-03-17 MED ORDER — HYDROXYZINE HCL 25 MG PO TABS
25.0000 mg | ORAL_TABLET | Freq: Three times a day (TID) | ORAL | Status: DC | PRN
  Administered 2021-03-17: 25 mg via ORAL
  Filled 2021-03-17: qty 1

## 2021-03-17 MED ORDER — TRAZODONE HCL 100 MG PO TABS
100.0000 mg | ORAL_TABLET | Freq: Every day | ORAL | Status: DC
  Administered 2021-03-17: 100 mg via ORAL
  Filled 2021-03-17: qty 1

## 2021-03-17 MED ORDER — ACETAMINOPHEN 325 MG PO TABS: 650.0000 mg | ORAL_TABLET | Freq: Four times a day (QID) | ORAL | Status: DC | PRN

## 2021-03-17 NOTE — ED Notes (Signed)
Pt sleeping@this  time. Breathing even and unlabored. Will continue to monitor for safety. Will continue to monitor for safety

## 2021-03-17 NOTE — ED Notes (Signed)
Patient received meal at 11am (salad)

## 2021-03-17 NOTE — ED Provider Notes (Addendum)
Behavioral Health Admission H&P [facility] & OBS)  Date: 03/17/21 Patient Name: Akiko Gowen MRN: [id number] Chief Complaint: No chief complaint on file.     Diagnoses:  Final diagnoses:  Schizoaffective disorder, bipolar type ([place])    HPI: Verjean Neugebauer is a 50 y.o. who presented to [facility] voluntarily as a walk-in due to worsening depression and SI. Patient was transferred to [facility] for continuous assessment.   On evaluation, patient is alert and oriented x 4. She is calm and cooperative. Speech is clear and coherent.  Patient is disheveled and appears to be more depressed than her previous encounters with this provider.  Patient states that she is concerned because her psychiatrist continues to ask her if she is transgender. She states "I have told her that I am not gay or transgender but she keeps asking." She reports that she saw her psychiatrist, Dr. Catherine [place]ag[place]ar, yesterday. She states that she received her Invega injection 2 days ago. She states that she receives services through [place] [facility]. Patient states that she is feeling more depressed than usual. She reports passive suicidal ideations without a plan. She states that she will likely harm herself if she is discharged. She states "I don't know how but I would try something." Patient denies homicidal ideations. She denies auditory and visual hallucinations. No indication that she is responding to internal stimuli. Patient states that someone is taking stuff from her apartment. 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 She states "But I don't want to work there so I guess I am going to call out or do a no show." She states that she was told that the work may be to difficult for her.   Patient has a history of multiple inpatient admissions.  She was last inpatient at [facility] June 2021.  At that time she was  discharged on paliperidone 6 mg nightly, Invega Sustenna 234 mg monthly, temazepam 50 mg nightly, Depakote 500 mg twice daily, Cogentin 0.5 mg nightly.   PHQ 2-9:  [place] ED from 02/23/2021 in [facility] Office Visit from 09/16/2020 in [facility] Office Visit from 10/24/2016 in [place]  Thoughts that you would be better off dead, or of hurting yourself in some way Several days Not at all Several days  PHQ-9 Total Score '6 1 11       '$ Flowsheet Row OP Visit from 03/17/2021 in [place] ED from 02/23/2021 in [place] [facility] from 12/19/2018 in [place] [facility]  C-SSRS RISK CATEGORY Error: Q6 is Yes, you must answer 7 Low Risk No Risk        Total Time spent with patient: 30 minutes  Musculoskeletal  Strength & Muscle Tone: within normal limits Gait & Station: normal Patient leans: N/A  Psychiatric Specialty Exam  Presentation General Appearance: Disheveled  Eye Contact:Fair  Speech:Clear and Coherent; Normal Rate  Speech Volume:Normal  Handedness:Right   Mood and Affect  Mood:Depressed  Affect:Congruent   Thought Process  Thought Processes:Coherent; Goal Directed  Descriptions of Associations:Intact  Orientation:Full (Time, Place and Person)  Thought Content:Paranoid Ideation  Diagnosis of Schizophrenia or Schizoaffective disorder in past: Yes  Duration of Psychotic Symptoms: Greater than six months  Hallucinations:Hallucinations: None  Ideas  of Reference:None  Suicidal Thoughts:Suicidal Thoughts: Yes, Passive SI Passive Intent and/or Plan: Without Intent; Without Plan; Without Means to Carry Out  Homicidal Thoughts:Homicidal Thoughts: No   Sensorium  Memory:Immediate Good; Recent Fair; Remote Fair  Judgment:Intact  Insight:Lacking   Executive  Functions  Concentration:Fair  Attention Span:Fair  [place]  Language:Good   Psychomotor Activity  Psychomotor Activity:Psychomotor Activity: Normal   Assets  Assets:Communication Skills; Physical Health; Desire for Improvement; Financial Resources/Insurance; Web designer; Social Support   Sleep  Sleep:Sleep: Fair   Nutritional Assessment (For OBS and [facility] admissions only) Has the patient had a weight loss or gain of 10 pounds or more in the last 3 months?: No Has the patient had a decrease in food intake/or appetite?: No Does the patient have dental problems?: No Does the patient have eating habits or behaviors that may be indicators of an eating disorder including binging or inducing vomiting?: No Has the patient recently lost weight without trying?: No Has the patient been eating poorly because of a decreased appetite?: No Malnutrition Screening Tool Score: 0    Physical Exam Constitutional:      General: She is not in acute distress.    Appearance: She is not ill-appearing, toxic-appearing or diaphoretic.  HENT:     Head: Normocephalic.     Right Ear: External ear normal.     Left Ear: External ear normal.  Eyes:     Conjunctiva/sclera: Conjunctivae normal.     Pupils: Pupils are equal, round, and reactive to light.  Cardiovascular:     Rate and Rhythm: Normal rate.  Pulmonary:     Effort: Pulmonary effort is normal. No respiratory distress.  Musculoskeletal:        General: Normal range of motion.  Neurological:     Mental Status: She is alert and oriented to person, place, and time.  Psychiatric:        Mood and Affect: Mood is anxious and depressed.        Thought Content: Thought content includes suicidal ideation. Thought content does not include homicidal ideation. Thought content does not include suicidal plan.   Review of Systems  Constitutional:  Negative for chills, diaphoresis, fever, malaise/fatigue and  weight loss.  HENT:  Negative for congestion.   Respiratory:  Negative for cough and shortness of breath.   Cardiovascular:  Negative for chest pain and palpitations.  Gastrointestinal:  Negative for diarrhea, nausea and vomiting.  Neurological:  Negative for dizziness and seizures.  Psychiatric/Behavioral:  Positive for depression and suicidal ideas. Negative for hallucinations, memory loss and substance abuse. The patient is nervous/anxious and has insomnia.   All other systems reviewed and are negative.  Blood pressure (!) 123/94, pulse 85, temperature 98.3 F (36.8 C), temperature source Oral, resp. rate 18, SpO2 100 %. There is no height or weight on file to calculate BMI.  Past Psychiatric History: Schizoaffective Disorder, Bipolar Type. Patient has a history of multiple inpatient admissions.  She was last inpatient at [facility] June 2021.  At that time she was discharged on paliperidone 6 mg nightly, Invega Sustenna 234 mg monthly, temazepam 50 mg nightly, Depakote 500 mg twice daily, Cogentin 0.5 mg nightly.  Is the patient at risk to self? Yes  Has the patient been a risk to self in the past 6 months? No .    Has the patient been a risk to self within the distant past? Yes   Is the patient a risk to  others? No   Has the patient been a risk to others in the past 6 months? No   Has the patient been a risk to others within the distant past? No   Past Medical History:  Past Medical History:  Diagnosis Date  . Anxiety   . Asthma   . Bipolar 1 disorder ([place])   . Depression   . Gallstones 08/2020  . Hypertension   . Insomnia, persistent   . Prediabetes   . Schizophrenic disorder ([place])   . Seizures ([place])     Past Surgical History:  Procedure Laterality Date  . BREAST LUMPECTOMY Left 07/2013  . TONSILLECTOMY    . TUBAL LIGATION  2002    Family History:  Family History  Problem Relation Age of Onset  . Depression Mother   . Gout Mother   . Cancer  Father        prostate  . Other Father        lung issue  . Alcoholism Other   . Heart attack Paternal Grandfather   . Heart attack Paternal Grandmother   . Heart attack Maternal Grandmother   . Heart attack Maternal Grandfather   . Depression Son   . Anxiety disorder Son     Social History:  Social History   Socioeconomic History  . Marital status: Single    Spouse name: Not on file  . Number of children: Not on file  . Years of education: Not on file  . Highest education level: Not on file  Occupational History  . Not on file  Tobacco Use  . Smoking status: Former    Types: Cigarettes  . Smokeless tobacco: Never  Vaping Use  . Vaping Use: Never used  Substance and Sexual Activity  . Alcohol use: Not Currently  . Drug use: No  . Sexual activity: Not Currently  Other Topics Concern  . Not on file  Social History Narrative  . Not on file   Social Determinants of Health   Financial Resource Strain: Not on file  Food Insecurity: Food Insecurity Present  . Worried About Charity fundraiser in the Last Year: Sometimes true  . Ran Out of Food in the Last Year: Sometimes true  Transportation Needs: No Transportation Needs  . Lack of Transportation (Medical): No  . Lack of Transportation (Non-Medical): No  Physical Activity: Not on file  Stress: Not on file  Social Connections: Not on file  Intimate Partner Violence: Not on file    SDOH:  SDOH Screenings   Alcohol Screen: Not on file  Depression (PHQ2-9): Medium Risk  . PHQ-2 Score: 6  Financial Resource Strain: Not on file  Food Insecurity: Food Insecurity Present  . Worried About Charity fundraiser in the Last Year: Sometimes true  . Ran Out of Food in the Last Year: Sometimes true  Housing: Not on file  Physical Activity: Not on file  Social Connections: Not on file  Stress: Not on file  Tobacco Use: Medium Risk  . Smoking Tobacco Use: Former  . Smokeless Tobacco Use: Never  Transportation Needs: No  Transportation Needs  . Lack of Transportation (Medical): No  . Lack of Transportation (Non-Medical): No    Last Labs:  Admission on 03/17/2021  Component Date Value Ref Range Status  . POC Amphetamine UR 03/17/2021 None Detected  NONE DETECTED (Cut Off Level 1000 ng/mL) Preliminary  . POC Secobarbital (BAR) 03/17/2021 None Detected  NONE DETECTED (Cut Off Level 300 ng/mL) Preliminary  .  POC Buprenorphine (BUP) 03/17/2021 None Detected  NONE DETECTED (Cut Off Level 10 ng/mL) Preliminary  . POC Oxazepam (BZO) 03/17/2021 None Detected  NONE DETECTED (Cut Off Level 300 ng/mL) Preliminary  . POC Cocaine UR 03/17/2021 None Detected  NONE DETECTED (Cut Off Level 300 ng/mL) Preliminary  . POC Methamphetamine UR 03/17/2021 None Detected  NONE DETECTED (Cut Off Level 1000 ng/mL) Preliminary  . POC Morphine 03/17/2021 Positive (A) NONE DETECTED (Cut Off Level 300 ng/mL) Preliminary  . POC Oxycodone UR 03/17/2021 None Detected  NONE DETECTED (Cut Off Level 100 ng/mL) Preliminary  . POC Methadone UR 03/17/2021 None Detected  NONE DETECTED (Cut Off Level 300 ng/mL) Preliminary  . POC Marijuana UR 03/17/2021 None Detected  NONE DETECTED (Cut Off Level 50 ng/mL) Preliminary  Admission on 11/06/2020, Discharged on 11/06/2020  Component Date Value Ref Range Status  . WBC 11/06/2020 5.5  4.0 - 10.5 K/uL Final  . RBC 11/06/2020 3.91  3.87 - 5.11 MIL/uL Final  . Hemoglobin 11/06/2020 11.5 (A) 12.0 - 15.0 g/dL Final  . HCT 11/06/2020 36.2  36.0 - 46.0 % Final  . MCV 11/06/2020 92.6  80.0 - 100.0 fL Final  . MCH 11/06/2020 29.4  26.0 - 34.0 pg Final  . MCHC 11/06/2020 31.8  30.0 - 36.0 g/dL Final  . RDW 11/06/2020 12.9  11.5 - 15.5 % Final  . Platelets 11/06/2020 246  150 - 400 K/uL Final  . nRBC 11/06/2020 0.0  0.0 - 0.2 % Final  . Neutrophils Relative % 11/06/2020 59  % Final  . Neutro Abs 11/06/2020 3.2  1.7 - 7.7 K/uL Final  . Lymphocytes Relative 11/06/2020 32  % Final  . Lymphs Abs 11/06/2020 1.8   0.7 - 4.0 K/uL Final  . Monocytes Relative 11/06/2020 5  % Final  . Monocytes Absolute 11/06/2020 0.3  0.1 - 1.0 K/uL Final  . Eosinophils Relative 11/06/2020 4  % Final  . Eosinophils Absolute 11/06/2020 0.2  0.0 - 0.5 K/uL Final  . Basophils Relative 11/06/2020 0  % Final  . Basophils Absolute 11/06/2020 0.0  0.0 - 0.1 K/uL Final  . Immature Granulocytes 11/06/2020 0  % Final  . Abs Immature Granulocytes 11/06/2020 0.02  0.00 - 0.07 K/uL Final   Performed at [facility], [place] [address], [place], [place] [address]  . Sodium 11/06/2020 141  135 - 145 mmol/L Final  . Potassium 11/06/2020 3.7  3.5 - 5.1 mmol/L Final  . Chloride 11/06/2020 105  98 - 111 mmol/L Final  . CO2 11/06/2020 26  22 - 32 mmol/L Final  . Glucose, Bld 11/06/2020 124 (A) 70 - 99 mg/dL Final   Glucose reference range applies only to samples taken after fasting for at least 8 hours.  . BUN 11/06/2020 8  6 - 20 mg/dL Final  . Creatinine, Ser 11/06/2020 0.79  0.44 - 1.00 mg/dL Final  . Calcium 11/06/2020 9.0  8.9 - 10.3 mg/dL Final  . Total Protein 11/06/2020 7.5  6.5 - 8.1 g/dL Final  . Albumin 11/06/2020 4.0  3.5 - 5.0 g/dL Final  . AST 11/06/2020 17  15 - 41 U/L Final  . ALT 11/06/2020 14  0 - 44 U/L Final  . Alkaline Phosphatase 11/06/2020 70  38 - 126 U/L Final  . Total Bilirubin 11/06/2020 0.5  0.3 - 1.2 mg/dL Final  . GFR, Estimated 11/06/2020 >60  >60 mL/min Final   Comment: (NOTE) Calculated using the CKD-EPI Creatinine Equation (2021)   .  Anion gap 11/06/2020 10  5 - 15 Final   Performed at [facility], [place] [address] [place], [place] [address]  . Lipase 11/06/2020 27  11 - 51 U/L Final   Performed at [facility], [place] [address], [place], [place] [address]  . Color, Urine 11/06/2020 YELLOW  YELLOW Final  . APPearance 11/06/2020 CLEAR  CLEAR Final  . Specific Gravity, Urine 11/06/2020 1.010  1.005 - 1.030 Final  . pH 11/06/2020 5.0  5.0 - 8.0  Final  . Glucose, UA 11/06/2020 NEGATIVE  NEGATIVE mg/dL Final  . Hgb urine dipstick 11/06/2020 NEGATIVE  NEGATIVE Final  . Bilirubin Urine 11/06/2020 NEGATIVE  NEGATIVE Final  . Ketones, ur 11/06/2020 NEGATIVE  NEGATIVE mg/dL Final  . Protein, ur 11/06/2020 NEGATIVE  NEGATIVE mg/dL Final  . Nitrite 11/06/2020 NEGATIVE  NEGATIVE Final  . Chalmers Guest 11/06/2020 NEGATIVE  NEGATIVE Final  . RBC / HPF 11/06/2020 0-5  0 - 5 RBC/hpf Final  . WBC, UA 11/06/2020 0-5  0 - 5 WBC/hpf Final  . Bacteria, UA 11/06/2020 RARE (A) NONE SEEN Final  . Squamous Epithelial / LPF 11/06/2020 0-5  0 - 5 Final  . Mucus 11/06/2020 PRESENT   Final   Performed at [facility], [place] [address] [place], [place] [address]  . SARS Coronavirus 2 11/06/2020 NEGATIVE  NEGATIVE Final   Comment: (NOTE) SARS-CoV-2 target nucleic acids are NOT DETECTED.  The SARS-CoV-2 RNA is generally detectable in upper and lower respiratory specimens during the acute phase of infection. Negative results do not preclude SARS-CoV-2 infection, do not rule out co-infections with other pathogens, and should not be used as the sole basis for treatment or other patient management decisions. Negative results must be combined with clinical observations, patient history, and epidemiological information. The expected result is Negative.  Fact Sheet for Patients: SugarRoll.be  Fact Sheet for Healthcare Providers: https://www.woods-mathews.com/  This test is not yet approved or cleared by the [place] FDA and  has been authorized for detection and/or diagnosis of SARS-CoV-2 by FDA under an Emergency Use Authorization (EUA). This EUA will remain  in effect (meaning this test can be used) for the duration of the COVID-19 declaration under Se                          ction 564(b)(1) of the Act, 21 U.S.C. section 360bbb-3(b)(1), unless the authorization is terminated or revoked  sooner.  Performed at [facility], [place] [address], [place], [place] [address]   . I-stat hCG, quantitative 11/06/2020 <5.0  <5 mIU/mL Final  . Comment 3 11/06/2020          Final   Comment:   GEST. AGE      CONC.  (mIU/mL)   <=1 WEEK        5 - 50     2 WEEKS       50 - 500     3 WEEKS       100 - 10,000     4 WEEKS     1,000 - 30,000        FEMALE AND NON-PREGNANT FEMALE:     LESS THAN 5 mIU/mL     Allergies: Haldol [haloperidol decanoate], Penicillins, Pollen extract, and Shrimp [shellfish allergy]  PTA Medications: (Not in a hospital admission)   Medical Decision Making  Admit to continuous assessment for crisis stabilization  Labs pending  Clinical Course  as of 03/17/21 2029  Thu Mar 17, 2021  0550 POCT Urine Drug Screen - (ICup)(!) UDS positive for morphine, otherwise negative [JB]    Clinical Course User Index [JB] Rozetta Nunnery, NP    Recommendations  Based on my evaluation the patient does not appear to have an emergency medical condition.  Recommend inpatient treatment.  Per Wynonia Hazard, AC, No appropriate beds available at [facility].  Rozetta Nunnery, NP 03/17/21  5:51 AM

## 2021-03-17 NOTE — ED Notes (Signed)
Patient given meal. 

## 2021-03-17 NOTE — ED Notes (Signed)
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
## 2021-03-17 NOTE — ED Provider Notes (Signed)
Behavioral Health Progress Note  Date and Time: 03/17/2021 2:36 PM Name: [name] MRN:  [id number]  Subjective:  Patient is a 50 year old female with a history of schizophrenia who was monitored overnight for SI. This morning, she states that she slept well. She denies SI, stating that it was only situational due to being evicted from her apartment (which she has a plan to get the money to remain there), minor familial troubles, and difficulty obtaining necessary documents to enroll in school. She denies HI/AVH as well. When asked what we can do to help her, she stated that she had her last Invega injection 2 weeks ago and feels that she needs more Invega as well as a therapist. She also seems to perseverate on transgenderism, asking this Probation officer if it was common for doctors to tell patients that they were transgendered. Her timeline is inconsistent with what was discussed on admission, so this writer attempted to reach out to her ACT team for confirmation of her medications as well as her baseline mental status.   Efforts to reach her ACT team nurse were unsuccessful x 2. Messages were left for the nurse to return the call.    Diagnosis:  Final diagnoses:  Schizoaffective disorder, bipolar type ([place])    Total Time spent with patient: 20 minutes  Past Psychiatric History: See H&P Past Medical History:  Past Medical History:  Diagnosis Date   Anxiety    Asthma    Bipolar 1 disorder ([place])    Depression    Gallstones 08/2020   Hypertension    Insomnia, persistent    Prediabetes    Schizophrenic disorder ([place])    Seizures ([place])     Past Surgical History:  Procedure Laterality Date   BREAST LUMPECTOMY Left 07/2013   TONSILLECTOMY     TUBAL LIGATION  2002   Family History:  Family History  Problem Relation Age of Onset   Depression Mother    Gout Mother    Cancer Father        prostate   Other Father        lung issue   Alcoholism Other    Heart attack Paternal Grandfather     Heart attack Paternal Grandmother    Heart attack Maternal Grandmother    Heart attack Maternal Grandfather    Depression Son    Anxiety disorder Son    Family Psychiatric  History: See H&P Social History:  Social History   Substance and Sexual Activity  Alcohol Use Not Currently     Social History   Substance and Sexual Activity  Drug Use No    Social History   Socioeconomic History   Marital status: Single    Spouse name: Not on file   Number of children: Not on file   Years of education: Not on file   Highest education level: Not on file  Occupational History   Not on file  Tobacco Use   Smoking status: Former    Types: Cigarettes   Smokeless tobacco: Never  Vaping Use   Vaping Use: Never used  Substance and Sexual Activity   Alcohol use: Not Currently   Drug use: No   Sexual activity: Not Currently  Other Topics Concern   Not on file  Social History Narrative   Not on file   Social Determinants of Health   Financial Resource Strain: Not on file  Food Insecurity: Food Insecurity Present   Worried About [place] in the  Last Year: Sometimes true   Ran Out of Food in the Last Year: Sometimes true  Transportation Needs: No Transportation Needs   Lack of Transportation (Medical): No   Lack of Transportation (Non-Medical): No  Physical Activity: Not on file  Stress: Not on file  Social Connections: Not on file   SDOH:  SDOH Screenings   Alcohol Screen: Not on file  Depression (PHQ2-9): Medium Risk   PHQ-2 Score: 6  Financial Resource Strain: Not on file  Food Insecurity: Food Insecurity Present   Worried About Charity fundraiser in the Last Year: Sometimes true   Ran Out of Food in the Last Year: Sometimes true  Housing: Not on file  Physical Activity: Not on file  Social Connections: Not on file  Stress: Not on file  Tobacco Use: Medium Risk   Smoking Tobacco Use: Former   Smokeless Tobacco Use: Never  Transportation Needs: No  Data processing manager (Medical): No   Lack of Transportation (Non-Medical): No   Additional Social History:                         Sleep: Fair  Appetite:  Good  Current Medications:  Current Facility-Administered Medications  Medication Dose Route Frequency Provider Last Rate Last Admin   acetaminophen (TYLENOL) tablet 650 mg  650 mg Oral Q6H PRN Lindon Romp A, NP       albuterol (VENTOLIN HFA) 108 (90 Base) MCG/ACT inhaler 2 puff  2 puff Inhalation Q6H PRN Rozetta Nunnery, NP       alum & mag hydroxide-simeth (MAALOX/MYLANTA) 200-200-20 MG/5ML suspension 30 mL  30 mL Oral Q4H PRN Lindon Romp A, NP       atorvastatin (LIPITOR) tablet 10 mg  10 mg Oral Daily Lindon Romp A, NP   10 mg at 03/17/21 1043   hydrOXYzine (ATARAX/VISTARIL) tablet 25 mg  25 mg Oral TID PRN Rozetta Nunnery, NP       magnesium hydroxide (MILK OF MAGNESIA) suspension 30 mL  30 mL Oral Daily PRN Rozetta Nunnery, NP       traZODone (DESYREL) tablet 100 mg  100 mg Oral QHS Rozetta Nunnery, NP       Current Outpatient Medications  Medication Sig Dispense Refill   atorvastatin (LIPITOR) 10 MG tablet Take 10 mg by mouth daily.     albuterol (VENTOLIN HFA) 108 (90 Base) MCG/ACT inhaler Inhale 2 puffs into the lungs every 6 (six) hours as needed for wheezing or shortness of breath. 18 g 0   budesonide-formoterol (SYMBICORT) 160-4.5 MCG/ACT inhaler Inhale 2 puffs into the lungs 2 (two) times daily. For Shortness of breath (Patient not taking: No sig reported) 1 Inhaler 0   fluticasone (FLONASE) 50 MCG/ACT nasal spray Place 2 sprays into both nostrils daily for 14 days. 11.1 mL 0   fluticasone (FLOVENT HFA) 44 MCG/ACT inhaler Inhale 2 puffs into the lungs 2 (two) times daily. For SOB 1 Inhaler 12   ibuprofen (ADVIL) 200 MG tablet Take 200 mg by mouth every 6 (six) hours as needed.     paliperidone (INVEGA SUSTENNA) 234 MG/1.5ML SUSY injection Inject 234 mg into the muscle once for 1 dose.  (Patient taking differently: Inject 234 mg into the muscle once. Last dose 03/04/21) 1.5 mL 1   traZODone (DESYREL) 100 MG tablet Take 100 mg by mouth at bedtime.      Labs  Lab Results:  Admission on  03/17/2021  Component Date Value Ref Range Status   SARS Coronavirus 2 by RT PCR 03/17/2021 NEGATIVE  NEGATIVE Final   Comment: (NOTE) SARS-CoV-2 target nucleic acids are NOT DETECTED.  The SARS-CoV-2 RNA is generally detectable in upper respiratory specimens during the acute phase of infection. The lowest concentration of SARS-CoV-2 viral copies this assay can detect is 138 copies/mL. A negative result does not preclude SARS-Cov-2 infection and should not be used as the sole basis for treatment or other patient management decisions. A negative result may occur with  improper specimen collection/handling, submission of specimen other than nasopharyngeal swab, presence of viral mutation(s) within the areas targeted by this assay, and inadequate number of viral copies(<138 copies/mL). A negative result must be combined with clinical observations, patient history, and epidemiological information. The expected result is Negative.  Fact Sheet for Patients:  EntrepreneurPulse.com.au  Fact Sheet for Healthcare Providers:  IncredibleEmployment.be  This test is no                          t yet approved or cleared by the [place] FDA and  has been authorized for detection and/or diagnosis of SARS-CoV-2 by FDA under an Emergency Use Authorization (EUA). This EUA will remain  in effect (meaning this test can be used) for the duration of the COVID-19 declaration under Section 564(b)(1) of the Act, 21 U.S.C.section 360bbb-3(b)(1), unless the authorization is terminated  or revoked sooner.       Influenza A by PCR 03/17/2021 NEGATIVE  NEGATIVE Final   Influenza B by PCR 03/17/2021 NEGATIVE  NEGATIVE Final   Comment: (NOTE) The Xpert Xpress  SARS-CoV-2/FLU/RSV plus assay is intended as an aid in the diagnosis of influenza from Nasopharyngeal swab specimens and should not be used as a sole basis for treatment. Nasal washings and aspirates are unacceptable for Xpert Xpress SARS-CoV-2/FLU/RSV testing.  Fact Sheet for Patients: EntrepreneurPulse.com.au  Fact Sheet for Healthcare Providers: IncredibleEmployment.be  This test is not yet approved or cleared by the [place] FDA and has been authorized for detection and/or diagnosis of SARS-CoV-2 by FDA under an Emergency Use Authorization (EUA). This EUA will remain in effect (meaning this test can be used) for the duration of the COVID-19 declaration under Section 564(b)(1) of the Act, 21 U.S.C. section 360bbb-3(b)(1), unless the authorization is terminated or revoked.  Performed at [facility], [place] [address] [place], [place] [address]    WBC 03/17/2021 5.7  4.0 - 10.5 K/uL Final   RBC 03/17/2021 4.17  3.87 - 5.11 MIL/uL Final   Hemoglobin 03/17/2021 12.0  12.0 - 15.0 g/dL Final   HCT 03/17/2021 37.1  36.0 - 46.0 % Final   MCV 03/17/2021 89.0  80.0 - 100.0 fL Final   MCH 03/17/2021 28.8  26.0 - 34.0 pg Final   MCHC 03/17/2021 32.3  30.0 - 36.0 g/dL Final   RDW 03/17/2021 13.5  11.5 - 15.5 % Final   Platelets 03/17/2021 282  150 - 400 K/uL Final   nRBC 03/17/2021 0.0  0.0 - 0.2 % Final   Neutrophils Relative % 03/17/2021 50  % Final   Neutro Abs 03/17/2021 2.9  1.7 - 7.7 K/uL Final   Lymphocytes Relative 03/17/2021 37  % Final   Lymphs Abs 03/17/2021 2.1  0.7 - 4.0 K/uL Final   Monocytes Relative 03/17/2021 7  % Final   Monocytes Absolute 03/17/2021 0.4  0.1 - 1.0 K/uL Final   Eosinophils Relative  03/17/2021 5  % Final   Eosinophils Absolute 03/17/2021 0.3  0.0 - 0.5 K/uL Final   Basophils Relative 03/17/2021 1  % Final   Basophils Absolute 03/17/2021 0.0  0.0 - 0.1 K/uL Final   Immature Granulocytes 03/17/2021 0  %  Final   Abs Immature Granulocytes 03/17/2021 0.01  0.00 - 0.07 K/uL Final   Performed at [facility], [place] [address] [place], [place] [address]   Sodium 03/17/2021 140  135 - 145 mmol/L Final   Potassium 03/17/2021 3.8  3.5 - 5.1 mmol/L Final   Chloride 03/17/2021 104  98 - 111 mmol/L Final   CO2 03/17/2021 25  22 - 32 mmol/L Final   Glucose, Bld 03/17/2021 105 (A) 70 - 99 mg/dL Final   Glucose reference range applies only to samples taken after fasting for at least 8 hours.   BUN 03/17/2021 8  6 - 20 mg/dL Final   Creatinine, Ser 03/17/2021 0.85  0.44 - 1.00 mg/dL Final   Calcium 03/17/2021 9.6  8.9 - 10.3 mg/dL Final   Total Protein 03/17/2021 7.7  6.5 - 8.1 g/dL Final   Albumin 03/17/2021 4.0  3.5 - 5.0 g/dL Final   AST 03/17/2021 15  15 - 41 U/L Final   ALT 03/17/2021 16  0 - 44 U/L Final   Alkaline Phosphatase 03/17/2021 77  38 - 126 U/L Final   Total Bilirubin 03/17/2021 0.9  0.3 - 1.2 mg/dL Final   GFR, Estimated 03/17/2021 >60  >60 mL/min Final   Comment: (NOTE) Calculated using the CKD-EPI Creatinine Equation (2021)    Anion gap 03/17/2021 11  5 - 15 Final   Performed at [place] [address], [place], [place] [address]   Hgb A1c MFr Bld 03/17/2021 5.8 (A) 4.8 - 5.6 % Final   Comment: (NOTE) Pre diabetes:          5.7%-6.4%  Diabetes:              >6.4%  Glycemic control for   <7.0% adults with diabetes    Mean Plasma Glucose 03/17/2021 119.76  mg/dL Final   Performed at [facility], [place] [address] [place], [place] [address]   TSH 03/17/2021 2.003  0.350 - 4.500 uIU/mL Final   Comment: Performed by a 3rd Generation assay with a functional sensitivity of <=0.01 uIU/mL. Performed at [facility], [place] [address], [place], [place] [address]    POC Amphetamine UR 03/17/2021 None Detected  NONE DETECTED (Cut Off Level 1000 ng/mL) Preliminary   POC Secobarbital (BAR) 03/17/2021 None Detected  NONE DETECTED (Cut Off Level 300 ng/mL)  Preliminary   POC Buprenorphine (BUP) 03/17/2021 None Detected  NONE DETECTED (Cut Off Level 10 ng/mL) Preliminary   POC Oxazepam (BZO) 03/17/2021 None Detected  NONE DETECTED (Cut Off Level 300 ng/mL) Preliminary   POC Cocaine UR 03/17/2021 None Detected  NONE DETECTED (Cut Off Level 300 ng/mL) Preliminary   POC Methamphetamine UR 03/17/2021 None Detected  NONE DETECTED (Cut Off Level 1000 ng/mL) Preliminary   POC Morphine 03/17/2021 Positive (A) NONE DETECTED (Cut Off Level 300 ng/mL) Preliminary   POC Oxycodone UR 03/17/2021 None Detected  NONE DETECTED (Cut Off Level 100 ng/mL) Preliminary   POC Methadone UR 03/17/2021 None Detected  NONE DETECTED (Cut Off Level 300 ng/mL) Preliminary   POC Marijuana UR 03/17/2021 None Detected  NONE DETECTED (Cut Off Level 50 ng/mL) Preliminary   SARS Coronavirus 2 Ag 03/17/2021 Negative  Negative Preliminary   Preg Test, Ur 03/17/2021 NEGATIVE  NEGATIVE Final   Comment:        THE SENSITIVITY OF THIS METHODOLOGY IS >24 mIU/mL    Cholesterol 03/17/2021 205 (A) 0 - 200 mg/dL Final   Triglycerides 03/17/2021 127  <150 mg/dL Final   HDL 03/17/2021 37 (A) >40 mg/dL Final   Total CHOL/HDL Ratio 03/17/2021 5.5  RATIO Final   VLDL 03/17/2021 25  0 - 40 mg/dL Final   LDL Cholesterol 03/17/2021 143 (A) 0 - 99 mg/dL Final   Comment:        Total Cholesterol/HDL:CHD Risk Coronary Heart Disease Risk Table                     Men   Women  1/2 Average Risk   3.4   3.3  Average Risk       5.0   4.4  2 X Average Risk   9.6   7.1  3 X Average Risk  23.4   11.0        Use the calculated Patient Ratio above and the CHD Risk Table to determine the patient's CHD Risk.        ATP III CLASSIFICATION (LDL):  <100     mg/dL   Optimal  100-129  mg/dL   Near or Above                    Optimal  130-159  mg/dL   Borderline  160-189  mg/dL   High  >190     mg/dL   Very High Performed at [facility] [address], [place], [place] [address]   Admission on  11/06/2020, Discharged on 11/06/2020  Component Date Value Ref Range Status   WBC 11/06/2020 5.5  4.0 - 10.5 K/uL Final   RBC 11/06/2020 3.91  3.87 - 5.11 MIL/uL Final   Hemoglobin 11/06/2020 11.5 (A) 12.0 - 15.0 g/dL Final   HCT 11/06/2020 36.2  36.0 - 46.0 % Final   MCV 11/06/2020 92.6  80.0 - 100.0 fL Final   MCH 11/06/2020 29.4  26.0 - 34.0 pg Final   MCHC 11/06/2020 31.8  30.0 - 36.0 g/dL Final   RDW 11/06/2020 12.9  11.5 - 15.5 % Final   Platelets 11/06/2020 246  150 - 400 K/uL Final   nRBC 11/06/2020 0.0  0.0 - 0.2 % Final   Neutrophils Relative % 11/06/2020 59  % Final   Neutro Abs 11/06/2020 3.2  1.7 - 7.7 K/uL Final   Lymphocytes Relative 11/06/2020 32  % Final   Lymphs Abs 11/06/2020 1.8  0.7 - 4.0 K/uL Final   Monocytes Relative 11/06/2020 5  % Final   Monocytes Absolute 11/06/2020 0.3  0.1 - 1.0 K/uL Final   Eosinophils Relative 11/06/2020 4  % Final   Eosinophils Absolute 11/06/2020 0.2  0.0 - 0.5 K/uL Final   Basophils Relative 11/06/2020 0  % Final   Basophils Absolute 11/06/2020 0.0  0.0 - 0.1 K/uL Final   Immature Granulocytes 11/06/2020 0  % Final   Abs Immature Granulocytes 11/06/2020 0.02  0.00 - 0.07 K/uL Final   Performed at [facility], [place] [address], [place], [place] [address]   Sodium 11/06/2020 141  135 - 145 mmol/L Final   Potassium 11/06/2020 3.7  3.5 - 5.1 mmol/L Final   Chloride 11/06/2020 105  98 - 111 mmol/L Final   CO2 11/06/2020 26  22 -  32 mmol/L Final   Glucose, Bld 11/06/2020 124 (A) 70 - 99 mg/dL Final   Glucose reference range applies only to samples taken after fasting for at least 8 hours.   BUN 11/06/2020 8  6 - 20 mg/dL Final   Creatinine, Ser 11/06/2020 0.79  0.44 - 1.00 mg/dL Final   Calcium 11/06/2020 9.0  8.9 - 10.3 mg/dL Final   Total Protein 11/06/2020 7.5  6.5 - 8.1 g/dL Final   Albumin 11/06/2020 4.0  3.5 - 5.0 g/dL Final   AST 11/06/2020 17  15 - 41 U/L Final   ALT 11/06/2020 14  0 - 44 U/L Final    Alkaline Phosphatase 11/06/2020 70  38 - 126 U/L Final   Total Bilirubin 11/06/2020 0.5  0.3 - 1.2 mg/dL Final   GFR, Estimated 11/06/2020 >60  >60 mL/min Final   Comment: (NOTE) Calculated using the CKD-EPI Creatinine Equation (2021)    Anion gap 11/06/2020 10  5 - 15 Final   Performed at [facility], [place] [address], [place], [place] [address]   Lipase 11/06/2020 27  11 - 51 U/L Final   Performed at [facility], [place] [address] [place], [place] [address]   Color, Urine 11/06/2020 YELLOW  YELLOW Final   APPearance 11/06/2020 CLEAR  CLEAR Final   Specific Gravity, Urine 11/06/2020 1.010  1.005 - 1.030 Final   pH 11/06/2020 5.0  5.0 - 8.0 Final   Glucose, UA 11/06/2020 NEGATIVE  NEGATIVE mg/dL Final   Hgb urine dipstick 11/06/2020 NEGATIVE  NEGATIVE Final   Bilirubin Urine 11/06/2020 NEGATIVE  NEGATIVE Final   Ketones, ur 11/06/2020 NEGATIVE  NEGATIVE mg/dL Final   Protein, ur 11/06/2020 NEGATIVE  NEGATIVE mg/dL Final   Nitrite 11/06/2020 NEGATIVE  NEGATIVE Final   Leukocytes,Ua 11/06/2020 NEGATIVE  NEGATIVE Final   RBC / HPF 11/06/2020 0-5  0 - 5 RBC/hpf Final   WBC, UA 11/06/2020 0-5  0 - 5 WBC/hpf Final   Bacteria, UA 11/06/2020 RARE (A) NONE SEEN Final   Squamous Epithelial / LPF 11/06/2020 0-5  0 - 5 Final   Mucus 11/06/2020 PRESENT   Final   Performed at [facility], [place] [address], [place], [place] [address]   SARS Coronavirus 2 11/06/2020 NEGATIVE  NEGATIVE Final   Comment: (NOTE) SARS-CoV-2 target nucleic acids are NOT DETECTED.  The SARS-CoV-2 RNA is generally detectable in upper and lower respiratory specimens during the acute phase of infection. Negative results do not preclude SARS-CoV-2 infection, do not rule out co-infections with other pathogens, and should not be used as the sole basis for treatment or other patient management decisions. Negative results must be combined with clinical observations, patient  history, and epidemiological information. The expected result is Negative.  Fact Sheet for Patients: SugarRoll.be  Fact Sheet for Healthcare Providers: https://www.woods-mathews.com/  This test is not yet approved or cleared by the [place] FDA and  has been authorized for detection and/or diagnosis of SARS-CoV-2 by FDA under an Emergency Use Authorization (EUA). This EUA will remain  in effect (meaning this test can be used) for the duration of the COVID-19 declaration under Se                          ction 564(b)(1) of the Act, 21 U.S.C. section 360bbb-3(b)(1), unless the authorization is terminated or revoked sooner.  Performed at [facility], [place] [address] [place], [place] [address]  I-stat hCG, quantitative 11/06/2020 <5.0  <5 mIU/mL Final   Comment 3 11/06/2020          Final   Comment:   GEST. AGE      CONC.  (mIU/mL)   <=1 WEEK        5 - 50     2 WEEKS       50 - 500     3 WEEKS       100 - 10,000     4 WEEKS     1,000 - 30,000        FEMALE AND NON-PREGNANT FEMALE:     LESS THAN 5 mIU/mL     Blood Alcohol level:  Lab Results  Component Value Date   ETH <10 03/01/2020   ETH <10 37/90/2409    Metabolic Disorder Labs: Lab Results  Component Value Date   HGBA1C 5.8 (H) 03/17/2021   MPG 119.76 03/17/2021   MPG 96.8 08/27/2018   Lab Results  Component Value Date   PROLACTIN 74.6 (H) 09/28/2019   PROLACTIN 4.2 (L) 12/19/2018   Lab Results  Component Value Date   CHOL 205 (H) 03/17/2021   TRIG 127 03/17/2021   HDL 37 (L) 03/17/2021   CHOLHDL 5.5 03/17/2021   VLDL 25 03/17/2021   LDLCALC 143 (H) 03/17/2021   LDLCALC 123 (H) 09/28/2019    Therapeutic Lab Levels: Lab Results  Component Value Date   LITHIUM <0.06 (L) 07/15/2015   LITHIUM <0.06 (L) 06/28/2015   Lab Results  Component Value Date   VALPROATE <10 (L) 03/01/2020   VALPROATE 90 02/06/2020   No components found for:   CBMZ  Physical Findings   AIMS    Flowsheet Row Admission (Discharged) from OP Visit from 01/09/2020 in [facility] Most recent reading at 01/11/2020  9:00 AM Pre-admit (Canceled) from 12/19/2018 in [facility] Most recent reading at 01/10/2020  7:00 AM Admission (Discharged) from OP Visit from 09/27/2019 in [facility] Most recent reading at 09/29/2019  8:05 AM Admission (Discharged) from 12/19/2018 in [facility] Most recent reading at 12/24/2018 10:00 AM Admission (Discharged) from 10/04/2018 in [facility] Most recent reading at 10/08/2018 11:00 AM  AIMS Total Score 0 0 0 0 0      AUDIT    Flowsheet Row Admission (Discharged) from 02/02/2020 in [name] Admission (Discharged) from OP Visit from 01/09/2020 in [facility] [place] Admission (Discharged) from OP Visit from 09/27/2019 in [facility] Admission (Discharged) from OP Visit from 02/04/2019 in [facility] Admission (Discharged) from 10/04/2018 in [facility]  Alcohol Use Disorder Identification Test Final Score (AUDIT) 0 0 0 0 1      GAD-7    Flowsheet Row Office Visit from 09/16/2020 in [place] for [place] at [facility]  Total GAD-7 Score 2      PHQ2-9    [place] ED from 02/23/2021 in [facility] Office Visit from 09/16/2020 in [place] for [place] at [facility] Office Visit from 10/24/2016 in [facility]  PHQ-2 Total Score 2 0 2  PHQ-9 Total Score 6 1 11       Flowsheet Row ED from 03/17/2021 in [facility] Most recent reading at 03/17/2021  6:42 AM OP Visit (  Canceled) from 03/17/2021 in [facility] Most recent reading at 03/17/2021  4:51 AM Pre-admit (Canceled) from 12/19/2018 in [facility] Most recent reading at 01/10/2020  7:53 AM  C-SSRS RISK CATEGORY High Risk Error: Q6 is Yes, you must answer 7 No Risk        Musculoskeletal  Strength & Muscle Tone: within normal limits Gait & Station:  Patient lying down; did not observe Patient leans:  unknown  Psychiatric Specialty Exam  Presentation  General Appearance: Casual  Eye Contact:Minimal  Speech:Clear and Coherent; Normal Rate  Speech Volume:Decreased  Handedness:Right   Mood and Affect  Mood:Euthymic  Affect:Congruent   Thought Process  Thought Processes:Coherent; Linear; Goal Directed  Descriptions of Associations:Intact  Orientation:Full (Time, Place and Person)  Thought Content:WDL  Diagnosis of Schizophrenia or Schizoaffective disorder in past: Yes  Duration of Psychotic Symptoms: Greater than six months   Hallucinations:Hallucinations: None  Ideas of Reference:None  Suicidal Thoughts:Suicidal Thoughts: No SI Passive Intent and/or Plan: Without Intent; Without Plan; Without Means to Carry Out  Homicidal Thoughts:Homicidal Thoughts: No   Sensorium  Memory:Immediate Good; Recent Fair; Remote [place]  Insight:Fair   Executive Functions  Concentration:Good  Attention Span:Good  [place]  Language:Good   Psychomotor Activity  Psychomotor Activity:Psychomotor Activity: Normal   Assets  Assets:Communication Skills; Desire for Improvement; Vocational/Educational   Sleep  Sleep:Sleep: Fair   Nutritional Assessment (For OBS and FBC admissions only) Has the patient had a weight loss or gain of 10 pounds or more in the last 3 months?: No Has the patient had a decrease in food intake/or appetite?: No Does the patient have dental problems?: No Does the patient have eating habits or behaviors that may  be indicators of an eating disorder including binging or inducing vomiting?: No Has the patient recently lost weight without trying?: No Has the patient been eating poorly because of a decreased appetite?: No Malnutrition Screening Tool Score: 0    Physical Exam   Blood pressure (!) 123/94, pulse 85, temperature 98.3 F (36.8 C), temperature source Oral, resp. rate 18, SpO2 100 %. There is no height or weight on file to calculate BMI.  Treatment Plan Summary: Daily contact with patient to assess and evaluate symptoms and progress in treatment and Medication management  Rosezetta Schlatter, MD 03/17/2021 2:36 PM

## 2021-03-17 NOTE — Progress Notes (Signed)
Patient resting, eyes closed respirations even and unlabored at this time. No objective signs of distress.

## 2021-03-17 NOTE — ED Notes (Signed)
Pt A&O x 4, presents with suicidal ideations x 2-3 weeks , no plan noted.  Pt reports her stressor was triggered by Psychiatrist asking her about transgender.  Pt reports she would rather be dead.  Pt also reports someone is stealing her things at night.  Denies HI or AVH.  Skin search completed, monitoring for safety, no distress noted.

## 2021-03-17 NOTE — Progress Notes (Signed)
Patient resting with even and unlabored respirations at this time. Nursing staff will continue to monitor.

## 2021-03-17 NOTE — Progress Notes (Signed)
CSW contacted [facility] ACT team [phone].  CSW spoke with Judson Roch and informed them that she is attempting to speak with the ACT team for the patient.    CSW was able to leave contact information for a call back.  CSW provided name of provider and front desk number for [facility] should call be after CSW leaves.  Assunta Curtis, MSW, LCSW 03/17/2021 3:38 PM

## 2021-03-17 NOTE — H&P (Signed)
Behavioral Health Medical Screening Exam  [name] is a 50 y.o. female with a history of schizoaffective disorder who presents to [facility] voluntarily due to [name]. Patient is alert and oriented x 4. She is calm and cooperative. Speech is clear and coherent. Patient states that she is concerned because her psychiatrist continues to ask her if she is transgender. She states "I have told her that I am not gay or transgender but she keeps asking." She reports that she saw her psychiatrist, Dr. Catherine [place]ag[place]ar, yesterday. She states that she received her Invega injection 2 days ago. She states that she receives services through [facility]. Patient states that she is feeling more depressed than usual. She reports passive suicidal ideations without a plan. She states that she will likely harm herself if she is discharged. She states "I don't know how but I would try something." Patient denies homicidal ideations. She denies auditory and visual hallucinations. No indication that she is responding to internal stimuli. Patient states that someone is taking stuff from her apartment. 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 She states "But I don't want to work there so I guess I am going to call out or do a no show." She states that she was told that the work may be to difficult for her.   Total Time spent with patient: 30 minutes  Psychiatric Specialty Exam:  Presentation  General Appearance: Disheveled  Eye Contact:Fair  Speech:Clear and Coherent; Normal Rate  Speech Volume:Normal  Handedness:Right   Mood and Affect  Mood:Depressed  Affect:Congruent   Thought Process  Thought Processes:Coherent; Goal Directed  Descriptions of Associations:Intact  Orientation:Full (Time, Place and Person)  Thought Content:Rumination  History of Schizophrenia/Schizoaffective  disorder:Yes  Duration of Psychotic Symptoms:Less than six months  Hallucinations:Hallucinations: None  Ideas of Reference:None  Suicidal Thoughts:Suicidal Thoughts: Yes, Passive SI Passive Intent and/or Plan: Without Intent; Without Plan  Homicidal Thoughts:Homicidal Thoughts: No   Sensorium  Memory:Immediate Good; Recent Fair; Remote Fair  Judgment:Intact  Insight:Lacking   Executive Functions  Concentration:Fair  Attention Span:Fair  [place]  Language:Good   Psychomotor Activity  Psychomotor Activity:Psychomotor Activity: Normal   Assets  Assets:Communication Skills; Desire for Improvement; Financial Resources/Insurance; Housing; Physical Health   Sleep  Sleep:Sleep: Fair    Physical Exam: Physical Exam Constitutional:      General: She is not in acute distress.    Appearance: She is not ill-appearing, toxic-appearing or diaphoretic.  Cardiovascular:     Rate and Rhythm: Normal rate.  Pulmonary:     Effort: Pulmonary effort is normal. No respiratory distress.  Musculoskeletal:        General: Normal range of motion.  Neurological:     Mental Status: She is alert and oriented to person, place, and time.  Psychiatric:        Thought Content: Thought content includes suicidal ideation. Thought content does not include suicidal plan.   Review of Systems  Constitutional:  Negative for chills, diaphoresis, fever, malaise/fatigue and weight loss.  HENT:  Negative for congestion.   Respiratory:  Negative for cough and shortness of breath.   Cardiovascular:  Negative for chest pain and palpitations.  Gastrointestinal:  Negative for diarrhea, nausea and vomiting.  Neurological:  Negative for dizziness and seizures.  Psychiatric/Behavioral:  Positive for depression and suicidal ideas. Negative for hallucinations, memory  loss and substance abuse. The patient is nervous/anxious and has insomnia.   All other systems reviewed and  are negative. Blood pressure 112/88, pulse 87, temperature 98.4 F (36.9 C), temperature source Oral, SpO2 100 %. There is no height or weight on file to calculate BMI.  Musculoskeletal: Strength & Muscle Tone: within normal limits Gait & Station: normal Patient leans: N/A   Recommendations:  Based on my evaluation the patient does not appear to have an emergency medical condition.  Rozetta Nunnery, NP 03/17/2021, 4:51 AM

## 2021-03-17 NOTE — ED Notes (Signed)
Pt stating that she wants to be discharged home.  NP Ene Ajibola notified.  Pt resting at present, no distress noted, calm & cooperative.  Monitoring for safety.

## 2021-03-18 DIAGNOSIS — F250 Schizoaffective disorder, bipolar type: Secondary | ICD-10-CM | POA: Diagnosis not present

## 2021-03-18 NOTE — ED Provider Notes (Signed)
FBC/OBS ASAP Discharge Summary  Date and Time: 03/18/2021 8:15 AM  Name: [name]  MRN:  [id number]   Discharge Diagnoses:  Final diagnoses:  Schizoaffective disorder, bipolar type [facility])    Subjective: Patient seen an chart reviewed. Last night patient was requesting discharge due to feeling better. She denied SI/HI/AVH and stated that she wanted to go to [facility] so she could get her car and drive home. Pt stated that she felt better and ready for discharge. She denied paranoia and no bizarre/unusual TC was ellicited or volunteered. Patient no longer meeting criteria for be at the [facility] or for inpatient admission. Patient was advised to follow up with the [facility] ACT team, pt verbalized understanding.  Stay Summary: Patient presented yesterday 7/21 to [facility] for depression and SI and as tranferred to the [facility] for continunous assesment. Pt denied HI/AVH and reported some paranoa related to her ACT team psychiatrist believing she was atransgender person. Patient had LAI invega sustenna 234 mg monthly injection earlier this month. Patient was admitted for observation and restarted on her home medications. Multiple attempts were made to contact her ACT team during her stay at the [facility], but all attempts were unsuccessful. Patient reported feeling better the following day and requested discharge-denied SI/HI/AVH and asked to  be transported to [facility] so she could get her car and driver herself home.   Total Time spent with patient: 15 minutes  Past Psychiatric History: see H&P Past Medical History:  Past Medical History:  Diagnosis Date   Anxiety    Asthma    Bipolar 1 disorder ([place])    Depression    Gallstones 08/2020   Hypertension    Insomnia, persistent    Prediabetes    Schizophrenic disorder ([place])    Seizures ([place])     Past Surgical History:  Procedure Laterality Date   BREAST LUMPECTOMY Left 07/2013   TONSILLECTOMY     TUBAL LIGATION  2002   Family History:  Family History  Problem  Relation Age of Onset   Depression Mother    Gout Mother    Cancer Father        prostate   Other Father        lung issue   Alcoholism Other    Heart attack Paternal Grandfather    Heart attack Paternal Grandmother    Heart attack Maternal Grandmother    Heart attack Maternal Grandfather    Depression Son    Anxiety disorder Son    Family Psychiatric History: see H&P Social History:  Social History   Substance and Sexual Activity  Alcohol Use Not Currently     Social History   Substance and Sexual Activity  Drug Use No    Social History   Socioeconomic History   Marital status: Single    Spouse name: Not on file   Number of children: Not on file   Years of education: Not on file   Highest education level: Not on file  Occupational History   Not on file  Tobacco Use   Smoking status: Former    Types: Cigarettes   Smokeless tobacco: Never  Vaping Use   Vaping Use: Never used  Substance and Sexual Activity   Alcohol use: Not Currently   Drug use: No   Sexual activity: Not Currently  Other Topics Concern   Not on file  Social History Narrative   Not on file   Social Determinants of Health   Financial Resource Strain: Not on file  Food Insecurity: Landscape architect Present   Worried About Charity fundraiser in the Last Year: Sometimes true   Arboriculturist in the Last Year: Sometimes true  Transportation Needs: No Transportation Needs   Lack of Transportation (Medical): No   Lack of Transportation (Non-Medical): No  Physical Activity: Not on file  Stress: Not on file  Social Connections: Not on file   SDOH:  SDOH Screenings   Alcohol Screen: Not on file  Depression (PHQ2-9): Medium Risk   PHQ-2 Score: 6  Financial Resource Strain: Not on file  Food Insecurity: Food Insecurity Present   Worried About Charity fundraiser in the Last Year: Sometimes true   Ran Out of Food in the Last Year: Sometimes true  Housing: Not on file  Physical Activity:  Not on file  Social Connections: Not on file  Stress: Not on file  Tobacco Use: Medium Risk   Smoking Tobacco Use: Former   Smokeless Tobacco Use: Never  Transportation Needs: No Data processing manager (Medical): No   Lack of Transportation (Non-Medical): No    Tobacco Cessation:  N/A, patient does not currently use tobacco products  Current Medications:  Current Facility-Administered Medications  Medication Dose Route Frequency Provider Last Rate Last Admin   acetaminophen (TYLENOL) tablet 650 mg  650 mg Oral Q6H PRN Lindon Romp A, NP       albuterol (VENTOLIN HFA) 108 (90 Base) MCG/ACT inhaler 2 puff  2 puff Inhalation Q6H PRN Lindon Romp A, NP       alum & mag hydroxide-simeth (MAALOX/MYLANTA) 200-200-20 MG/5ML suspension 30 mL  30 mL Oral Q4H PRN Lindon Romp A, NP       atorvastatin (LIPITOR) tablet 10 mg  10 mg Oral Daily Lindon Romp A, NP   10 mg at 03/17/21 1043   hydrOXYzine (ATARAX/VISTARIL) tablet 25 mg  25 mg Oral TID PRN Rozetta Nunnery, NP   25 mg at 03/17/21 2107   magnesium hydroxide (MILK OF MAGNESIA) suspension 30 mL  30 mL Oral Daily PRN Rozetta Nunnery, NP       traZODone (DESYREL) tablet 100 mg  100 mg Oral QHS Lindon Romp A, NP   100 mg at 03/17/21 2107   Current Outpatient Medications  Medication Sig Dispense Refill   atorvastatin (LIPITOR) 10 MG tablet Take 10 mg by mouth daily.     albuterol (VENTOLIN HFA) 108 (90 Base) MCG/ACT inhaler Inhale 2 puffs into the lungs every 6 (six) hours as needed for wheezing or shortness of breath. 18 g 0   budesonide-formoterol (SYMBICORT) 160-4.5 MCG/ACT inhaler Inhale 2 puffs into the lungs 2 (two) times daily. For Shortness of breath (Patient not taking: No sig reported) 1 Inhaler 0   fluticasone (FLONASE) 50 MCG/ACT nasal spray Place 2 sprays into both nostrils daily for 14 days. 11.1 mL 0   fluticasone (FLOVENT HFA) 44 MCG/ACT inhaler Inhale 2 puffs into the lungs 2 (two) times daily. For SOB 1  Inhaler 12   ibuprofen (ADVIL) 200 MG tablet Take 200 mg by mouth every 6 (six) hours as needed.     paliperidone (INVEGA SUSTENNA) 234 MG/1.5ML SUSY injection Inject 234 mg into the muscle once for 1 dose. (Patient taking differently: Inject 234 mg into the muscle once. Last dose 03/04/21) 1.5 mL 1   traZODone (DESYREL) 100 MG tablet Take 100 mg by mouth at bedtime.      PTA Medications: (Not in a hospital admission)  Musculoskeletal  Strength & Muscle Tone: within normal limits Gait & Station: normal Patient leans: N/A  Psychiatric Specialty Exam  Presentation  General Appearance: Appropriate for Environment; Casual  Eye Contact:Fair  Speech:Normal Rate; Clear and Coherent  Speech Volume:Decreased  Handedness:Right   Mood and Affect  Mood:Euthymic  Affect:Appropriate; Congruent   Thought Process  Thought Processes:Coherent; Goal Directed; Linear  Descriptions of Associations:Intact  Orientation:Full (Time, Place and Person)  Thought Content:WDL  Diagnosis of Schizophrenia or Schizoaffective disorder in past: Yes  Duration of Psychotic Symptoms: Greater than six months   Hallucinations:Hallucinations: None  Ideas of Reference:None  Suicidal Thoughts:Suicidal Thoughts: No SI Passive Intent and/or Plan: Without Intent; Without Plan; Without Means to Carry Out  Homicidal Thoughts:Homicidal Thoughts: No   Sensorium  Memory:Immediate Good; Recent Good; Remote Good  Judgment:Fair  Insight:Fair   Executive Functions  Concentration:Good  Attention Span:Good  Recall:Good  Fund of Knowledge:Fair  Language:Good   Psychomotor Activity  Psychomotor Activity:Psychomotor Activity: Normal   Assets  Assets:Communication Skills; Desire for Improvement; Vocational/Educational   Sleep  Sleep:Sleep: Fair   Nutritional Assessment (For OBS and FBC admissions only) Has the patient had a weight loss or gain of 10 pounds or more in the last 3 months?:  No Has the patient had a decrease in food intake/or appetite?: No Does the patient have dental problems?: No Does the patient have eating habits or behaviors that may be indicators of an eating disorder including binging or inducing vomiting?: No Has the patient recently lost weight without trying?: No Has the patient been eating poorly because of a decreased appetite?: No Malnutrition Screening Tool Score: 0    Physical Exam  Physical Exam Constitutional:      Appearance: Normal appearance. She is normal weight.  HENT:     Head: Normocephalic and atraumatic.  Pulmonary:     Effort: Pulmonary effort is normal.  Neurological:     Mental Status: She is alert and oriented to person, place, and time.  Psychiatric:        Mood and Affect: Mood normal.        Behavior: Behavior normal.   Review of Systems  Constitutional:  Negative for chills and fever.  HENT:  Negative for hearing loss.   Eyes:  Negative for discharge and redness.  Respiratory:  Negative for cough.   Cardiovascular:  Negative for chest pain.  Gastrointestinal:  Negative for abdominal pain.  Musculoskeletal:  Negative for myalgias.  Neurological:  Negative for headaches.  Psychiatric/Behavioral:  Negative for depression, hallucinations, substance abuse and suicidal ideas.   Blood pressure (!) 123/94, pulse 85, temperature 98.3 F (36.8 C), temperature source Oral, resp. rate 18, SpO2 100 %. There is no height or weight on file to calculate BMI.  Demographic Factors:  Living alone  Loss Factors: NA  Historical Factors: NA  Risk Reduction Factors:   Positive therapeutic relationship and Positive coping skills or problem solving skills About to start a new job on 7/25 Continued Clinical Symptoms:  Schizophrenia:   Paranoid or undifferentiated type  Cognitive Features That Contribute To Risk:  Thought constriction (tunnel vision)    Suicide Risk:  Minimal: No identifiable suicidal ideation.  Patients  presenting with no risk factors but with morbid ruminations; may be classified as minimal risk based on the severity of the depressive symptoms  Plan Of Care/Follow-up recommendations:  Activity:  as tolerated Diet:  regular Other:  . Patient is instructed prior to discharge to: Take all medications as prescribed by his/her  mental healthcare provider. Report any adverse effects and or reactions from the medicines to his/her outpatient provider promptly. Patient has been instructed & cautioned: To not engage in alcohol and or illegal drug use while on prescription medicines. In the event of worsening symptoms, patient is instructed to call the crisis hotline, 911 and or go to the nearest ED for appropriate evaluation and treatment of symptoms. To follow-up with his/her primary care provider for your other medical issues, concerns and or health care needs.    Patient instructed to fall her ACT team for follow up care Disposition: home  Ival Bible, MD 03/18/2021, 8:15 AM

## 2021-03-18 NOTE — ED Notes (Signed)
Pt given breakfast.

## 2021-03-18 NOTE — ED Notes (Signed)
Pt denies SI/HI/AVH. Calm, cooperative with staff and requesting to discharge. Informed pt to notify staff with any needs or concerns. Will continue to monitor for safety.

## 2021-03-18 NOTE — ED Notes (Signed)
Pt talking with provider. No acute distress noted. Safety maintained.

## 2021-03-18 NOTE — ED Notes (Signed)
Called safe transport to take pt to [facility] parking lot to get her car. Pt made aware.

## 2021-03-18 NOTE — Discharge Instructions (Addendum)
Please call [facility] ACT time (number listed below) in order to follow up  Patient is instructed prior to discharge to: Take all medications as prescribed by his/her mental healthcare provider. Report any adverse effects and or reactions from the medicines to his/her outpatient provider promptly. Patient has been instructed & cautioned: To not engage in alcohol and or illegal drug use while on prescription medicines. In the event of worsening symptoms, patient is instructed to call the crisis hotline, 911 and or go to the nearest ED for appropriate evaluation and treatment of symptoms. To follow-up with his/her primary care provider for your other medical issues, concerns and or health care needs.

## 2021-03-18 NOTE — ED Notes (Signed)
Pt sleeping'@this'$  time. breathing even and unlabored. Will continue to monitor for safety

## 2021-03-18 NOTE — ED Notes (Signed)
Pt sleeping in no acute distress. RR even and unlabored. Safety maintained. 

## 2021-03-18 NOTE — ED Notes (Signed)
Patient A&O x 4, ambulatory. Patient discharged in no acute distress. Patient denied SI/HI, A/VH upon discharge. Patient verbalized understanding of all discharge instructions explained by staff, to include follow up appointments, RX's and safety plan. Pt belongings returned to patient from locker #26 intact. Patient escorted to [place] via staff for transport to destination [facility] parking lot). Safety maintained.

## 2021-03-26 ENCOUNTER — Other Ambulatory Visit: Payer: Self-pay

## 2021-03-26 ENCOUNTER — Emergency Department (HOSPITAL_COMMUNITY)
Admission: EM | Admit: 2021-03-26 | Discharge: 2021-03-26 | Discharge status: Against Medical Advice | Payer: Medicare HMO | Attending: Emergency Medicine | Admitting: Emergency Medicine

## 2021-03-26 ENCOUNTER — Encounter (HOSPITAL_COMMUNITY): Payer: Self-pay | Admitting: Emergency Medicine

## 2021-03-26 DIAGNOSIS — J029 Acute pharyngitis, unspecified: Secondary | ICD-10-CM | POA: Diagnosis not present

## 2021-03-26 DIAGNOSIS — Z5321 Procedure and treatment not carried out due to patient leaving prior to being seen by health care provider: Secondary | ICD-10-CM | POA: Insufficient documentation

## 2021-03-26 DIAGNOSIS — R519 Headache, unspecified: Secondary | ICD-10-CM | POA: Diagnosis not present

## 2021-03-26 DIAGNOSIS — R197 Diarrhea, unspecified: Secondary | ICD-10-CM | POA: Diagnosis not present

## 2021-03-26 NOTE — ED Notes (Signed)
I called patient for a room and no one responded 

## 2021-03-26 NOTE — ED Triage Notes (Signed)
Patient complains of headache, sore throat and diarrhea. Tested Covid negative 2 days ago, does not think it is Covid.

## 2021-03-26 NOTE — ED Provider Notes (Signed)
Patient never entered an examination room to be evaluated.  She left without telling anyone.   Daleen Bo, MD 03/26/21 [phone]

## 2021-04-01 ENCOUNTER — Telehealth: Payer: Self-pay | Admitting: *Deleted

## 2021-04-01 NOTE — Telephone Encounter (Signed)
Pt called regarding her mychart information.

## 2021-04-13 ENCOUNTER — Other Ambulatory Visit: Payer: Self-pay

## 2021-04-13 ENCOUNTER — Encounter (HOSPITAL_COMMUNITY): Payer: Self-pay

## 2021-04-13 ENCOUNTER — Emergency Department (HOSPITAL_COMMUNITY): Payer: Medicare Other

## 2021-04-13 ENCOUNTER — Emergency Department (HOSPITAL_COMMUNITY)
Admission: EM | Admit: 2021-04-13 | Discharge: 2021-04-13 | Disposition: A | Discharge status: Home / Self Care | Payer: Medicare Other | Attending: Emergency Medicine | Admitting: Emergency Medicine

## 2021-04-13 DIAGNOSIS — I10 Essential (primary) hypertension: Secondary | ICD-10-CM | POA: Insufficient documentation

## 2021-04-13 DIAGNOSIS — Z7951 Long term (current) use of inhaled steroids: Secondary | ICD-10-CM | POA: Diagnosis not present

## 2021-04-13 DIAGNOSIS — Z79899 Other long term (current) drug therapy: Secondary | ICD-10-CM | POA: Insufficient documentation

## 2021-04-13 DIAGNOSIS — J45909 Unspecified asthma, uncomplicated: Secondary | ICD-10-CM | POA: Diagnosis not present

## 2021-04-13 DIAGNOSIS — R0981 Nasal congestion: Secondary | ICD-10-CM | POA: Diagnosis present

## 2021-04-13 DIAGNOSIS — Y900 Blood alcohol level of less than 20 mg/100 ml: Secondary | ICD-10-CM | POA: Diagnosis not present

## 2021-04-13 DIAGNOSIS — F606 Avoidant personality disorder: Secondary | ICD-10-CM | POA: Diagnosis not present

## 2021-04-13 DIAGNOSIS — J069 Acute upper respiratory infection, unspecified: Secondary | ICD-10-CM | POA: Diagnosis not present

## 2021-04-13 DIAGNOSIS — D649 Anemia, unspecified: Secondary | ICD-10-CM | POA: Diagnosis not present

## 2021-04-13 DIAGNOSIS — Z87891 Personal history of nicotine dependence: Secondary | ICD-10-CM | POA: Diagnosis not present

## 2021-04-13 LAB — COMPREHENSIVE METABOLIC PANEL
ALT: 22 U/L (ref 0–44)
AST: 21 U/L (ref 15–41)
Albumin: 3.6 g/dL (ref 3.5–5.0)
Alkaline Phosphatase: 78 U/L (ref 38–126)
Anion gap: 7 (ref 5–15)
BUN: 5 mg/dL — ABNORMAL LOW (ref 6–20)
CO2: 26 mmol/L (ref 22–32)
Calcium: 9 mg/dL (ref 8.9–10.3)
Chloride: 103 mmol/L (ref 98–111)
Creatinine, Ser: 0.88 mg/dL (ref 0.44–1.00)
GFR, Estimated: >60 mL/min (ref >60)
Glucose, Bld: 108 mg/dL — ABNORMAL HIGH (ref 70–99)
Potassium: 3.5 mmol/L (ref 3.5–5.1)
Sodium: 136 mmol/L (ref 135–145)
Total Bilirubin: 0.8 mg/dL (ref 0.3–1.2)
Total Protein: 7.2 g/dL (ref 6.5–8.1)

## 2021-04-13 LAB — CBC WITH DIFFERENTIAL/PLATELET
Abs Immature Granulocytes: 0.05 10*3/uL (ref 0.00–0.07)
Basophils Absolute: 0 10*3/uL (ref 0.0–0.1)
Basophils Relative: 0 %
Eosinophils Absolute: 0.2 10*3/uL (ref 0.0–0.5)
Eosinophils Relative: 3 %
HCT: 34.5 % — ABNORMAL LOW (ref 36.0–46.0)
Hemoglobin: 11.2 g/dL — ABNORMAL LOW (ref 12.0–15.0)
Immature Granulocytes: 1 %
Lymphocytes Relative: 30 %
Lymphs Abs: 2.3 10*3/uL (ref 0.7–4.0)
MCH: 29.1 pg (ref 26.0–34.0)
MCHC: 32.5 g/dL (ref 30.0–36.0)
MCV: 89.6 fL (ref 80.0–100.0)
Monocytes Absolute: 0.5 10*3/uL (ref 0.1–1.0)
Monocytes Relative: 6 %
Neutro Abs: 4.4 10*3/uL (ref 1.7–7.7)
Neutrophils Relative %: 60 %
Platelets: 271 10*3/uL (ref 150–400)
RBC: 3.85 MIL/uL — ABNORMAL LOW (ref 3.87–5.11)
RDW: 13.6 % (ref 11.5–15.5)
WBC: 7.5 10*3/uL (ref 4.0–10.5)
nRBC: 0 % (ref 0.0–0.2)

## 2021-04-13 LAB — RAPID URINE DRUG SCREEN, HOSP PERFORMED
Amphetamines: NOT DETECTED
Barbiturates: NOT DETECTED
Benzodiazepines: NOT DETECTED
Cocaine: NOT DETECTED
Opiates: NOT DETECTED
Tetrahydrocannabinol: NOT DETECTED

## 2021-04-13 LAB — ETHANOL: Alcohol, Ethyl (B): <10 mg/dL (ref <10)

## 2021-04-13 LAB — TROPONIN I (HIGH SENSITIVITY)
Troponin I (High Sensitivity): 5 ng/L (ref <18)
Troponin I (High Sensitivity): 3 ng/L (ref <18)

## 2021-04-13 NOTE — Progress Notes (Signed)
CSW contacted [facility] [phone]) who is currently offering patient TCIL services. Transition and Coordination independent living for people with disabilities. CSW spoke with Vernie Ammons who stated that she is not aware patient is being evicted. CSW was told that patient is connected with an ACT team with [facility] and they would be able to assist CSW further. Linwood Dibbles stated that she knows a portion of her rent is covered by special assistance and patient has to renew that yearly. CSW was told an FL2 is required at renewal. Linwood Dibbles stated patient must not be taking her medication and stated she believes she gets injections. CSW stated patient did seem confused on what she needed to do. CSW will contact [facility].

## 2021-04-13 NOTE — Progress Notes (Signed)
CSW contacted [name] [phone]) and was connected with patients ACT team. CSW stated that patient is saying she is going to be evicted if she doesn't have an FL2. CSW was told that patient is kinda right. CSW was told that they have a team meeting tomorrow and they would discuss patient and get a nurse to fill out an FL2. CSW stated that patient said its due by August 31st. CSW was told a nurse would complete the FL2 by Monday.

## 2021-04-13 NOTE — Progress Notes (Signed)
CSW contacted [facility] and spoke with [facility] in admissions. CSW was told patient walked into their facility requesting to be placed. Shirlee Limerick told CSW that they told patient she needed to go to her primary care doctor to obtain a FL2. Shirlee Limerick stated that the patient stated that she will need to keep her car and also find a place for her mother. Shirlee Limerick stated that patient doesn't look like she needs SNF and it also looks like she is trying to seek housing.

## 2021-04-13 NOTE — ED Notes (Signed)
[facility] not immediately available, patient left before staff able to find device to measure discharge vital signs.

## 2021-04-13 NOTE — ED Provider Notes (Signed)
[facility] EMERGENCY DEPARTMENT Provider Note   CSN: [id number] Arrival date & time: 04/13/21  0108     History Chief Complaint  Patient presents with   Shortness of Breath    [name] is a 50 y.o. female.  HPI 50 year old female states that she has schizoaffective disorder, complains of some nasal congestion, sneezing, sore throat.  States she has had multiple COVID tests that have been negative.  She reports that she had 1 that was negative in the past several days.  She states she has not been exposed anybody since that time.  She also reports that she had some sharp midline chest pain after already decided to come to the ED.  States she is mainly here to get FL 2 for an assisted living care facility that she is going to the end of the month.  She reports that she is try to get into her primary care doctor but cannot get in until August 26 and she needs EFL to perform before that time.  She denies any difficulty speaking or swallowing, productive cough, abdominal pain, nausea, vomiting, diarrhea, history of PE or DVT, lateralized leg swelling, or risk factors for DVT. She reports getting an [place] injection the beginning the month and being due again September 1.    Past Medical History:  Diagnosis Date   Anxiety    Asthma    Bipolar 1 disorder ([place])    Depression    Gallstones 08/2020   Hypertension    Insomnia, persistent    Prediabetes    Schizophrenic disorder ([place])    Seizures ([place])     Patient Active Problem List   Diagnosis Date Noted   Bipolar disorder, manic ([place]) 02/02/2020   Encounter for medical clearance for patient hold    Prediabetes 08/26/2015   Essential hypertension 08/26/2015   Seizures ([place]) 08/26/2015   Noncompliance with treatment 07/16/2015   Schizoaffective disorder, bipolar type ([place]) 01/11/2015    Past Surgical History:  Procedure Laterality Date   BREAST LUMPECTOMY Left 07/2013   TONSILLECTOMY     TUBAL LIGATION   2002     OB History     Gravida  2   Para  2   Term  2   Preterm      AB      Living  2      SAB      IAB      Ectopic      Multiple      Live Births  2           Family History  Problem Relation Age of Onset   Depression Mother    Gout Mother    Cancer Father        prostate   Other Father        lung issue   Alcoholism Other    Heart attack Paternal Grandfather    Heart attack Paternal Grandmother    Heart attack Maternal Grandmother    Heart attack Maternal Grandfather    Depression Son    Anxiety disorder Son     Social History   Tobacco Use   Smoking status: Former    Types: Cigarettes   Smokeless tobacco: Never  Vaping Use   Vaping Use: Never used  Substance Use Topics   Alcohol use: Not Currently   Drug use: No    Home Medications Prior to Admission medications   Medication Sig Start Date End Date Taking? Authorizing  Provider  albuterol (VENTOLIN HFA) 108 (90 Base) MCG/ACT inhaler Inhale 2 puffs into the lungs every 6 (six) hours as needed for wheezing or shortness of breath. 11/06/20   [name], PA-C  atorvastatin (LIPITOR) 10 MG tablet Take 10 mg by mouth daily. 02/22/21   [provider]  budesonide-formoterol (SYMBICORT) 160-4.5 MCG/ACT inhaler Inhale 2 puffs into the lungs 2 (two) times daily. For Shortness of breath Patient not taking: No sig reported 02/06/20   Clapacs, [name], MD  fluticasone (FLONASE) 50 MCG/ACT nasal spray Place 2 sprays into both nostrils daily for 14 days. 02/28/20 03/13/20  Tedd Sias, PA  fluticasone (FLOVENT HFA) 44 MCG/ACT inhaler Inhale 2 puffs into the lungs 2 (two) times daily. For SOB 02/06/20   Clapacs, [name], MD  ibuprofen (ADVIL) 200 MG tablet Take 200 mg by mouth every 6 (six) hours as needed.    [provider]  paliperidone (INVEGA SUSTENNA) 234 MG/1.5ML SUSY injection Inject 234 mg into the muscle once for 1 dose. Patient taking differently: Inject 234 mg into the  muscle once. Last dose 03/04/21 02/06/20 03/01/20  Clapacs, [name], MD  traZODone (DESYREL) 100 MG tablet Take 100 mg by mouth at bedtime. 02/19/20   [provider]  ferrous sulfate 325 (65 FE) MG tablet Take 1 tablet (325 mg total) by mouth daily with breakfast. Patient not taking: No sig reported 12/24/18 04/20/19  [name], MD    Allergies    Haldol [haloperidol decanoate], Penicillins, Pollen extract, and Shrimp [shellfish allergy]  Review of Systems   Review of Systems  All other systems reviewed and are negative.  Physical Exam Updated Vital Signs BP 118/79 (BP Location: Left Arm)   Pulse 86   Temp 98.2 F (36.8 C) (Oral)   Resp 15   Ht 1.676 m ('5\' 6"'$ )   Wt 103.9 kg   SpO2 97%   BMI 36.97 kg/m   Physical Exam Vitals and nursing note reviewed.  Constitutional:      Appearance: She is obese.  HENT:     Head: Normocephalic.     Mouth/Throat:     Mouth: Mucous membranes are moist.  Eyes:     Pupils: Pupils are equal, round, and reactive to light.  Cardiovascular:     Rate and Rhythm: Normal rate and regular rhythm.  Pulmonary:     Effort: Pulmonary effort is normal.     Breath sounds: Normal breath sounds.  Abdominal:     General: Bowel sounds are normal.     Palpations: Abdomen is soft.  Musculoskeletal:        General: Normal range of motion.     Cervical back: Normal range of motion.     Right lower leg: No tenderness. No edema.     Left lower leg: No tenderness. No edema.  Skin:    General: Skin is warm.     Capillary Refill: Capillary refill takes less than 2 seconds.  Neurological:     General: No focal deficit present.     Mental Status: She is alert.  Psychiatric:        Attention and Perception: Attention normal.        Mood and Affect: Mood is anxious.        Speech: Speech normal.        Behavior: Behavior normal.        Thought Content: Thought content normal.        Cognition and Memory: Cognition and  memory normal.    ED Results /  Procedures / Treatments   Labs (all labs ordered are listed, but only abnormal results are displayed) Labs Reviewed  CBC WITH DIFFERENTIAL/PLATELET - Abnormal; Notable for the following components:      Result Value   RBC 3.85 (*)    Hemoglobin 11.2 (*)    HCT 34.5 (*)    All other components within normal limits  COMPREHENSIVE METABOLIC PANEL - Abnormal; Notable for the following components:   Glucose, Bld 108 (*)    BUN 5 (*)    All other components within normal limits  ETHANOL  RAPID URINE DRUG SCREEN, HOSP PERFORMED  TROPONIN I (HIGH SENSITIVITY)  TROPONIN I (HIGH SENSITIVITY)    EKG EKG Interpretation  Date/Time:  Wednesday April 13 2021 01:27:56 EDT Ventricular Rate:  114 PR Interval:  138 QRS Duration: 76 QT Interval:  350 QTC Calculation: 482 R Axis:   187 Text Interpretation: Sinus tachycardia Low voltage QRS Possible Anterolateral infarct , age undetermined Abnormal ECG Confirmed by Pattricia Boss [id number] on 04/13/2021 10:45:22 AM  Radiology DG Chest 2 View  Result Date: 04/13/2021 CLINICAL DATA:  Chest pain with shortness of breath and cough. EXAM: CHEST - 2 VIEW COMPARISON:  February 05, 2021 FINDINGS: Mild crowding of the bronchovascular lung markings is seen within the bilateral lung bases. There is no evidence of acute infiltrate, pleural effusion or pneumothorax. The heart size and mediastinal contours are within normal limits. The visualized skeletal structures are unremarkable. IMPRESSION: No active cardiopulmonary disease. Electronically Signed   By: Virgina Norfolk M.D.   On: 04/13/2021 01:48    Procedures Procedures   Medications Ordered in ED Medications - No data to display  ED Course  I have reviewed the triage vital signs and the nursing notes.  Pertinent labs & imaging results that were available during my care of the patient were reviewed by me and considered in my medical decision making (see chart for details).    MDM Rules/Calculators/A&P                            50 year old female presents today complaining of some URI symptoms and need for FL 2.  She has had recent COVID test that were negative is not exhibiting any symptoms.  She reports that she has had her COVID vaccines.  Labs obtained prior to my evaluation are significant for mild anemia with hemoglobin of 11.2 and otherwise are normal.  EKG shows no evidence of acute ischemia and troponin and repeat troponin are normal.  EKG is obtained tachycardia but on reevaluation patient has not tachycardic.  She has no other risk factors for PE and is not dyspneic at this time.  Chest x-Remmie Bembenek is clear. Doubt acute coronary syndrome, PE, other diseases of great vessels, pulmonary etiology.  Plan social work consult to assist with FL 2.  Patient appears stable medically for discharge. Please see social work note Final Clinical Impression(s) / ED Diagnoses Final diagnoses:  Upper respiratory tract infection, unspecified type  Anemia, unspecified type    Rx / DC Orders ED Discharge Orders     None        Pattricia Boss, MD 04/13/21 1233

## 2021-04-13 NOTE — Discharge Instructions (Addendum)
You have a mild anemia with hemoglobin of 11 does not appear to need any further treatment at this time.  If you are taking iron, please continue and if you are not please start iron.  Discussed the type and dose with your pharmacist. Your social worker has been consulted and will assist you with completing forms that are needed

## 2021-04-13 NOTE — Progress Notes (Signed)
Patient was made aware that [name] will complete FL2 by 04/18/21. CSW instructed patient to contact [name] to make sure it gets completed by August 31st. Patient has their number in her phone and was able to tell CSW their address.

## 2021-04-13 NOTE — ED Provider Notes (Signed)
Emergency Medicine Provider Triage Evaluation Note  [name] , a 50 y.o. female  was evaluated in triage.  She is here with multiple complaints-- chest pain, SOB, cough, generally not feeling well.  Multiple recent covid tests that have been negative.  Also reports some issues with mental health and feels like she is "about to have another episode".  She reports she just cant stop talking but also cant focus when people ask her questions.  Review of Systems  Positive: Chest pain, SOB, mental health issues Negative: fever  Physical Exam  BP (!) 123/94 (BP Location: Left Arm)   Pulse (!) 113   Temp 97.8 F (36.6 C) (Oral)   Resp 18   Ht '5\' 6"'$  (1.676 m)   Wt 103.9 kg   SpO2 99%   BMI 36.97 kg/m  Gen:   Awake, no distress   Resp:  Normal effort  MSK:   Moves extremities without difficulty  Other:  Rambling speech, requiring redirection quite a few times during brief exam  Medical Decision Making  Medically screening exam initiated at 1:21 AM.  Appropriate orders placed.  [name] was informed that the remainder of the evaluation will be completed by another provider, this initial triage assessment does not replace that evaluation, and the importance of remaining in the ED until their evaluation is complete.  Multiple recent mental health visits.  Will check screening labs, EKG.   Larene Pickett, PA-C 04/13/21 [place], [place], DO 04/13/21 [phone]

## 2021-04-13 NOTE — Progress Notes (Signed)
CSW walked down the hallway to speak with patient. Patient was waving her hand in the air saying my name is [name] and I have my name badge on so you don't miss me. Patient stated she lives at [facility] apartments on [address] Patient stated she is part of the [place] program at [facility] and needs a FL2 filled out. Patient stated she gets special assistance through [facility] and speaks with Carlyle Basques. Patient stated her Scientist, research (medical) is [employer]. Patient stated she will be evicted by the end of the month if the [facility] does not get filled out by August 31st. Patient stated that she has an appointment with her NP in September but that will be too late. CSW let patient know that the ED does not give out Fl2's and that she will have to go through her primary doctor. Patient also told CSW that she has 2 children in college. Patient also asked for homeless shelter resources. CSW told patient she would call [facility] and get some more information. Patient appeared to be confused and was also asking about living in a SNF.

## 2021-04-13 NOTE — ED Triage Notes (Signed)
Shortness of breath x 2-3 days. Denies fevers.

## 2021-04-27 ENCOUNTER — Other Ambulatory Visit (HOSPITAL_COMMUNITY): Payer: Self-pay | Admitting: Gerontology

## 2021-04-27 ENCOUNTER — Ambulatory Visit (HOSPITAL_COMMUNITY)
Admission: RE | Admit: 2021-04-27 | Discharge: 2021-04-27 | Disposition: A | Discharge status: Home / Self Care | Payer: Medicare Other | Source: Ambulatory Visit | Attending: Gerontology | Admitting: Gerontology

## 2021-04-27 ENCOUNTER — Other Ambulatory Visit: Payer: Self-pay

## 2021-04-27 ENCOUNTER — Other Ambulatory Visit (HOSPITAL_COMMUNITY)
Admission: RE | Admit: 2021-04-27 | Discharge: 2021-04-27 | Disposition: A | Discharge status: Home / Self Care | Payer: Medicare Other | Source: Ambulatory Visit | Attending: Internal Medicine | Admitting: Internal Medicine

## 2021-04-27 DIAGNOSIS — D649 Anemia, unspecified: Secondary | ICD-10-CM | POA: Insufficient documentation

## 2021-04-27 DIAGNOSIS — Z1329 Encounter for screening for other suspected endocrine disorder: Secondary | ICD-10-CM | POA: Insufficient documentation

## 2021-04-27 DIAGNOSIS — R0989 Other specified symptoms and signs involving the circulatory and respiratory systems: Secondary | ICD-10-CM | POA: Insufficient documentation

## 2021-04-27 DIAGNOSIS — R739 Hyperglycemia, unspecified: Secondary | ICD-10-CM | POA: Diagnosis not present

## 2021-04-27 DIAGNOSIS — I10 Essential (primary) hypertension: Secondary | ICD-10-CM | POA: Insufficient documentation

## 2021-04-27 LAB — BASIC METABOLIC PANEL
Anion gap: 6 (ref 5–15)
BUN: 6 mg/dL (ref 6–20)
CO2: 24 mmol/L (ref 22–32)
Calcium: 8.6 mg/dL — ABNORMAL LOW (ref 8.9–10.3)
Chloride: 107 mmol/L (ref 98–111)
Creatinine, Ser: 0.89 mg/dL (ref 0.44–1.00)
GFR, Estimated: >60 mL/min (ref >60)
Glucose, Bld: 108 mg/dL — ABNORMAL HIGH (ref 70–99)
Potassium: 3.7 mmol/L (ref 3.5–5.1)
Sodium: 137 mmol/L (ref 135–145)

## 2021-04-27 LAB — CBC WITH DIFFERENTIAL/PLATELET
Abs Immature Granulocytes: 0.02 10*3/uL (ref 0.00–0.07)
Basophils Absolute: 0 10*3/uL (ref 0.0–0.1)
Basophils Relative: 0 %
Eosinophils Absolute: 0.2 10*3/uL (ref 0.0–0.5)
Eosinophils Relative: 3 %
HCT: 34.7 % — ABNORMAL LOW (ref 36.0–46.0)
Hemoglobin: 11.2 g/dL — ABNORMAL LOW (ref 12.0–15.0)
Immature Granulocytes: 0 %
Lymphocytes Relative: 28 %
Lymphs Abs: 1.7 10*3/uL (ref 0.7–4.0)
MCH: 29.1 pg (ref 26.0–34.0)
MCHC: 32.3 g/dL (ref 30.0–36.0)
MCV: 90.1 fL (ref 80.0–100.0)
Monocytes Absolute: 0.4 10*3/uL (ref 0.1–1.0)
Monocytes Relative: 6 %
Neutro Abs: 3.8 10*3/uL (ref 1.7–7.7)
Neutrophils Relative %: 63 %
Platelets: 289 10*3/uL (ref 150–400)
RBC: 3.85 MIL/uL — ABNORMAL LOW (ref 3.87–5.11)
RDW: 13.7 % (ref 11.5–15.5)
WBC: 6.1 10*3/uL (ref 4.0–10.5)
nRBC: 0 % (ref 0.0–0.2)

## 2021-04-27 LAB — HEPATIC FUNCTION PANEL
ALT: 13 U/L (ref 0–44)
AST: 15 U/L (ref 15–41)
Albumin: 3.9 g/dL (ref 3.5–5.0)
Alkaline Phosphatase: 74 U/L (ref 38–126)
Bilirubin, Direct: 0.1 mg/dL (ref 0.0–0.2)
Indirect Bilirubin: 0.1 mg/dL — ABNORMAL LOW (ref 0.3–0.9)
Total Bilirubin: 0.2 mg/dL — ABNORMAL LOW (ref 0.3–1.2)
Total Protein: 7.6 g/dL (ref 6.5–8.1)

## 2021-04-27 LAB — LIPID PANEL
Cholesterol: 189 mg/dL (ref 0–200)
HDL: 37 mg/dL — ABNORMAL LOW (ref >40)
LDL Cholesterol: 125 mg/dL — ABNORMAL HIGH (ref 0–99)
Total CHOL/HDL Ratio: 5.1 RATIO
Triglycerides: 134 mg/dL (ref <150)
VLDL: 27 mg/dL (ref 0–40)

## 2021-04-27 LAB — HEMOGLOBIN A1C
Hgb A1c MFr Bld: 6 % — ABNORMAL HIGH (ref 4.8–5.6)
Mean Plasma Glucose: 125.5 mg/dL

## 2021-04-27 LAB — TSH: TSH: 0.624 u[IU]/mL (ref 0.350–4.500)

## 2021-04-28 LAB — T4: T4, Total: 7.5 ug/dL (ref 4.5–12.0)

## 2021-06-07 ENCOUNTER — Encounter (HOSPITAL_COMMUNITY): Payer: Self-pay | Admitting: Emergency Medicine

## 2021-06-07 ENCOUNTER — Other Ambulatory Visit: Payer: Self-pay

## 2021-06-07 ENCOUNTER — Emergency Department (HOSPITAL_COMMUNITY)
Admission: EM | Admit: 2021-06-07 | Discharge: 2021-06-07 | Disposition: A | Discharge status: Home / Self Care | Payer: Medicare Other | Attending: Emergency Medicine | Admitting: Emergency Medicine

## 2021-06-07 DIAGNOSIS — R1110 Vomiting, unspecified: Secondary | ICD-10-CM | POA: Diagnosis present

## 2021-06-07 DIAGNOSIS — R443 Hallucinations, unspecified: Secondary | ICD-10-CM | POA: Diagnosis not present

## 2021-06-07 DIAGNOSIS — Z5321 Procedure and treatment not carried out due to patient leaving prior to being seen by health care provider: Secondary | ICD-10-CM | POA: Diagnosis not present

## 2021-06-07 LAB — URINALYSIS, ROUTINE W REFLEX MICROSCOPIC
Bilirubin Urine: NEGATIVE
Glucose, UA: NEGATIVE mg/dL
Hgb urine dipstick: NEGATIVE
Ketones, ur: NEGATIVE mg/dL
Leukocytes,Ua: NEGATIVE
Nitrite: NEGATIVE
Protein, ur: NEGATIVE mg/dL
Specific Gravity, Urine: 1.014 (ref 1.005–1.030)
pH: 5 (ref 5.0–8.0)

## 2021-06-07 LAB — COMPREHENSIVE METABOLIC PANEL
ALT: 17 U/L (ref 0–44)
AST: 14 U/L — ABNORMAL LOW (ref 15–41)
Albumin: 4 g/dL (ref 3.5–5.0)
Alkaline Phosphatase: 67 U/L (ref 38–126)
Anion gap: 7 (ref 5–15)
BUN: 17 mg/dL (ref 6–20)
CO2: 27 mmol/L (ref 22–32)
Calcium: 9.6 mg/dL (ref 8.9–10.3)
Chloride: 104 mmol/L (ref 98–111)
Creatinine, Ser: 0.7 mg/dL (ref 0.44–1.00)
GFR, Estimated: >60 mL/min (ref >60)
Glucose, Bld: 102 mg/dL — ABNORMAL HIGH (ref 70–99)
Potassium: 4.5 mmol/L (ref 3.5–5.1)
Sodium: 138 mmol/L (ref 135–145)
Total Bilirubin: 0.5 mg/dL (ref 0.3–1.2)
Total Protein: 7.6 g/dL (ref 6.5–8.1)

## 2021-06-07 LAB — LIPASE, BLOOD: Lipase: 30 U/L (ref 11–51)

## 2021-06-07 LAB — CBC
HCT: 36.1 % (ref 36.0–46.0)
Hemoglobin: 11.4 g/dL — ABNORMAL LOW (ref 12.0–15.0)
MCH: 28.4 pg (ref 26.0–34.0)
MCHC: 31.6 g/dL (ref 30.0–36.0)
MCV: 90 fL (ref 80.0–100.0)
Platelets: 273 10*3/uL (ref 150–400)
RBC: 4.01 MIL/uL (ref 3.87–5.11)
RDW: 13.8 % (ref 11.5–15.5)
WBC: 7.3 10*3/uL (ref 4.0–10.5)
nRBC: 0 % (ref 0.0–0.2)

## 2021-06-07 LAB — HCG, QUANTITATIVE, PREGNANCY: hCG, Beta Chain, Quant, S: <1 m[IU]/mL (ref <5)

## 2021-06-07 MED ORDER — ONDANSETRON 4 MG PO TBDP: 4.0000 mg | ORAL_TABLET | Freq: Once | ORAL | Status: DC | PRN

## 2021-06-07 NOTE — ED Triage Notes (Signed)
Pt complains of vomiting for the past 12 hrs. Pt also complains of not thinking the same way that she use to.

## 2021-07-03 ENCOUNTER — Emergency Department (HOSPITAL_BASED_OUTPATIENT_CLINIC_OR_DEPARTMENT_OTHER)
Admission: EM | Admit: 2021-07-03 | Discharge: 2021-07-04 | Disposition: A | Discharge status: Home / Self Care | Payer: Medicare Other | Attending: Emergency Medicine | Admitting: Emergency Medicine

## 2021-07-03 ENCOUNTER — Encounter (HOSPITAL_BASED_OUTPATIENT_CLINIC_OR_DEPARTMENT_OTHER): Payer: Self-pay

## 2021-07-03 ENCOUNTER — Other Ambulatory Visit: Payer: Self-pay

## 2021-07-03 DIAGNOSIS — I10 Essential (primary) hypertension: Secondary | ICD-10-CM | POA: Diagnosis not present

## 2021-07-03 DIAGNOSIS — Z7951 Long term (current) use of inhaled steroids: Secondary | ICD-10-CM | POA: Insufficient documentation

## 2021-07-03 DIAGNOSIS — Z87891 Personal history of nicotine dependence: Secondary | ICD-10-CM | POA: Insufficient documentation

## 2021-07-03 DIAGNOSIS — Z20822 Contact with and (suspected) exposure to covid-19: Secondary | ICD-10-CM | POA: Insufficient documentation

## 2021-07-03 DIAGNOSIS — R519 Headache, unspecified: Secondary | ICD-10-CM | POA: Diagnosis present

## 2021-07-03 DIAGNOSIS — R0981 Nasal congestion: Secondary | ICD-10-CM | POA: Insufficient documentation

## 2021-07-03 DIAGNOSIS — Z79899 Other long term (current) drug therapy: Secondary | ICD-10-CM | POA: Diagnosis not present

## 2021-07-03 DIAGNOSIS — F419 Anxiety disorder, unspecified: Secondary | ICD-10-CM | POA: Diagnosis not present

## 2021-07-03 DIAGNOSIS — J45909 Unspecified asthma, uncomplicated: Secondary | ICD-10-CM | POA: Diagnosis not present

## 2021-07-03 LAB — RESP PANEL BY RT-PCR (FLU A&B, COVID) ARPGX2
Influenza A by PCR: NEGATIVE
Influenza B by PCR: NEGATIVE
SARS Coronavirus 2 by RT PCR: NEGATIVE

## 2021-07-03 NOTE — ED Triage Notes (Addendum)
Pt is present for nasal congestion and headache x 3-4 days. Mother is also sick at home. Afebrile. Pt took Advil earlier today and it has helped improve sx. Denies SOB/Chest Pain. Rates headache 5/10.

## 2021-07-03 NOTE — Discharge Instructions (Signed)
You were seen today for multiple complaints including headache and anxiety.  Your COVID and influenza testing are negative.  Continue ibuprofen or Tylenol as needed for headaches.  Follow-up with [name] regarding your anxiety and for medication adjustment.

## 2021-07-03 NOTE — ED Notes (Signed)
Pt d/c home per MD order. Discharge summary reviewed with pt, pt verbalizes understanding. No s/s of acute distress noted . Pt provided taxi voucher per request . Ambulatory.

## 2021-07-03 NOTE — ED Provider Notes (Signed)
[facility] EMERGENCY DEPT Provider Note   CSN: [id number] Arrival date & time: 07/03/21  2145     History Chief Complaint  Patient presents with   Headache   Nasal Congestion    Cimberly Stoffel is a 50 y.o. female.  HPI     This 50 year old female with a history of anxiety, hypertension, schizophrenic disorder who presents with multiple complaints.  Initially she complains of headache.  States she has had headache for several days with nasal congestion.  Rates headache 5 out of 10.  She took ibuprofen with some relief.  No fevers.  Reports T-max at home to 98.  Reports the mother is also sick.  Patient also ruminates on some positive gonorrhea and Chlamydia testing from 2 years ago.  I tried to direct the patient to discuss any potential symptoms.  She denies any vaginal discharge, new sexual partners.  She reports that these results "just give me really high anxiety."  She states she was appropriately treated.  She not having any chest pain, shortness of breath, abdominal pain.  She reports that she is talking to herself a lot lately but is not hearing any voices.  She reports that her medicines are managed at [facility].  Denies SI or HI.  I did review the patient's chart.  She did test positive for gonorrhea in 2020.  No further testing past that.  Past Medical History:  Diagnosis Date   Anxiety    Asthma    Bipolar 1 disorder ([place])    Depression    Gallstones 08/2020   Hypertension    Insomnia, persistent    Prediabetes    Schizophrenic disorder ([place])    Seizures ([place])     Patient Active Problem List   Diagnosis Date Noted   Bipolar disorder, manic ([place]) 02/02/2020   Encounter for medical clearance for patient hold    Prediabetes 08/26/2015   Essential hypertension 08/26/2015   Seizures ([place]) 08/26/2015   Noncompliance with treatment 07/16/2015   Schizoaffective disorder, bipolar type ([place]) 01/11/2015    Past Surgical History:  Procedure Laterality Date    BREAST LUMPECTOMY Left 07/2013   TONSILLECTOMY     TUBAL LIGATION  2002     OB History     Gravida  2   Para  2   Term  2   Preterm      AB      Living  2      SAB      IAB      Ectopic      Multiple      Live Births  2           Family History  Problem Relation Age of Onset   Depression Mother    Gout Mother    Cancer Father        prostate   Other Father        lung issue   Alcoholism Other    Heart attack Paternal Grandfather    Heart attack Paternal Grandmother    Heart attack Maternal Grandmother    Heart attack Maternal Grandfather    Depression Son    Anxiety disorder Son     Social History   Tobacco Use   Smoking status: Former    Types: Cigarettes   Smokeless tobacco: Never  Vaping Use   Vaping Use: Never used  Substance Use Topics   Alcohol use: Not Currently   Drug use: No    Home Medications  Prior to Admission medications   Medication Sig Start Date End Date Taking? Authorizing Provider  albuterol (VENTOLIN HFA) 108 (90 Base) MCG/ACT inhaler Inhale 2 puffs into the lungs every 6 (six) hours as needed for wheezing or shortness of breath. 11/06/20   [name], PA-C  atorvastatin (LIPITOR) 10 MG tablet Take 10 mg by mouth daily. 02/22/21   [provider]  budesonide-formoterol (SYMBICORT) 160-4.5 MCG/ACT inhaler Inhale 2 puffs into the lungs 2 (two) times daily. For Shortness of breath Patient not taking: No sig reported 02/06/20   Clapacs, [name], MD  fluticasone (FLONASE) 50 MCG/ACT nasal spray Place 2 sprays into both nostrils daily for 14 days. 02/28/20 03/13/20  Tedd Sias, PA  fluticasone (FLOVENT HFA) 44 MCG/ACT inhaler Inhale 2 puffs into the lungs 2 (two) times daily. For SOB 02/06/20   Clapacs, [name], MD  ibuprofen (ADVIL) 200 MG tablet Take 200 mg by mouth every 6 (six) hours as needed.    [provider]  paliperidone (INVEGA SUSTENNA) 234 MG/1.5ML SUSY injection Inject 234 mg into the  muscle once for 1 dose. Patient taking differently: Inject 234 mg into the muscle once. Last dose 03/04/21 02/06/20 03/01/20  Clapacs, [name], MD  traZODone (DESYREL) 100 MG tablet Take 100 mg by mouth at bedtime. 02/19/20   [provider]  ferrous sulfate 325 (65 FE) MG tablet Take 1 tablet (325 mg total) by mouth daily with breakfast. Patient not taking: No sig reported 12/24/18 04/20/19  [name], MD    Allergies    Haldol [haloperidol decanoate], Penicillins, Pollen extract, and Shrimp [shellfish allergy]  Review of Systems   Review of Systems  Constitutional:  Negative for fever.  HENT:  Positive for congestion.   Respiratory:  Negative for shortness of breath.   Cardiovascular:  Negative for chest pain.  Gastrointestinal:  Negative for abdominal pain, nausea and vomiting.  Genitourinary:  Negative for vaginal bleeding.  Neurological:  Positive for headaches. Negative for weakness and numbness.  Psychiatric/Behavioral:  Negative for suicidal ideas.   All other systems reviewed and are negative.  Physical Exam Updated Vital Signs BP (!) 126/92 (BP Location: Left Arm)   Pulse 85   Temp 98.6 F (37 C) (Oral)   Resp 18   Ht 1.676 m (5\' 6" )   Wt 119.3 kg   SpO2 99%   BMI 42.45 kg/m   Physical Exam Vitals and nursing note reviewed.  Constitutional:      Appearance: She is well-developed. She is obese.  HENT:     Head: Normocephalic and atraumatic.  Eyes:     Pupils: Pupils are equal, round, and reactive to light.  Cardiovascular:     Rate and Rhythm: Normal rate and regular rhythm.     Heart sounds: Normal heart sounds.  Pulmonary:     Effort: Pulmonary effort is normal. No respiratory distress.     Breath sounds: No wheezing.  Abdominal:     Palpations: Abdomen is soft.  Musculoskeletal:     Cervical back: Normal range of motion and neck supple.  Skin:    General: Skin is warm and dry.  Neurological:     Mental Status: She is alert and oriented to person,  place, and time.     Comments: Fluent speech, cranial nerves II through XII intact, 5 out of 5 strength in all 4 extremities  Psychiatric:     Comments: Cooperative, tangential in speech, easily redirectable    ED Results /  Procedures / Treatments   Labs (all labs ordered are listed, but only abnormal results are displayed) Labs Reviewed  RESP PANEL BY RT-PCR (FLU A&B, COVID) [id number]    EKG None  Radiology No results found.  Procedures Procedures   Medications Ordered in ED Medications - No data to display  ED Course  I have reviewed the triage vital signs and the nursing notes.  Pertinent labs & imaging results that were available during my care of the patient were reviewed by me and considered in my medical decision making (see chart for details).    MDM Rules/Calculators/A&P                           Patient presents with multiple complaints.  Initially headache and congestion.  She then talks about prior testing that is giving her great anxiety.  She is not currently having any signs or symptoms of STDs.  She is neurologically intact.  She does have a sick contact.  COVID and influenza testing sent and are negative.  She is afebrile without meningeal signs.  Doubt meningitis.  Could be tension headache.  Regarding her anxiety, she is actively managed as an outpatient.  She does not currently appear manic or psychotic.  I have referred her back to [facility] for management of her medications.  After history, exam, and medical workup I feel the patient has been appropriately medically screened and is safe for discharge home. Pertinent diagnoses were discussed with the patient. Patient was given return precautions.  Final Clinical Impression(s) / ED Diagnoses Final diagnoses:  Acute nonintractable headache, unspecified headache type  Anxiety    Rx / DC Orders ED Discharge Orders     None        Merryl Hacker, MD 07/03/21 2334

## 2021-07-12 ENCOUNTER — Emergency Department (HOSPITAL_COMMUNITY)
Admission: EM | Admit: 2021-07-12 | Discharge: 2021-07-12 | Disposition: A | Discharge status: Home / Self Care | Payer: Medicare Other | Attending: Emergency Medicine | Admitting: Emergency Medicine

## 2021-07-12 ENCOUNTER — Ambulatory Visit: Payer: Medicaid Other | Admitting: Emergency Medicine

## 2021-07-12 ENCOUNTER — Emergency Department (HOSPITAL_COMMUNITY): Payer: Medicare Other

## 2021-07-12 ENCOUNTER — Other Ambulatory Visit: Payer: Self-pay

## 2021-07-12 DIAGNOSIS — I10 Essential (primary) hypertension: Secondary | ICD-10-CM | POA: Diagnosis not present

## 2021-07-12 DIAGNOSIS — Z7952 Long term (current) use of systemic steroids: Secondary | ICD-10-CM | POA: Insufficient documentation

## 2021-07-12 DIAGNOSIS — R0789 Other chest pain: Secondary | ICD-10-CM | POA: Insufficient documentation

## 2021-07-12 DIAGNOSIS — R42 Dizziness and giddiness: Secondary | ICD-10-CM | POA: Diagnosis not present

## 2021-07-12 DIAGNOSIS — M25511 Pain in right shoulder: Secondary | ICD-10-CM | POA: Diagnosis not present

## 2021-07-12 DIAGNOSIS — M79631 Pain in right forearm: Secondary | ICD-10-CM | POA: Diagnosis not present

## 2021-07-12 DIAGNOSIS — J45909 Unspecified asthma, uncomplicated: Secondary | ICD-10-CM | POA: Insufficient documentation

## 2021-07-12 DIAGNOSIS — R109 Unspecified abdominal pain: Secondary | ICD-10-CM | POA: Insufficient documentation

## 2021-07-12 DIAGNOSIS — Z79899 Other long term (current) drug therapy: Secondary | ICD-10-CM | POA: Diagnosis not present

## 2021-07-12 DIAGNOSIS — R0602 Shortness of breath: Secondary | ICD-10-CM | POA: Diagnosis not present

## 2021-07-12 DIAGNOSIS — Z87891 Personal history of nicotine dependence: Secondary | ICD-10-CM | POA: Insufficient documentation

## 2021-07-12 DIAGNOSIS — R079 Chest pain, unspecified: Secondary | ICD-10-CM

## 2021-07-12 LAB — CBC WITH DIFFERENTIAL/PLATELET
Abs Immature Granulocytes: 0.03 10*3/uL (ref 0.00–0.07)
Basophils Absolute: 0 10*3/uL (ref 0.0–0.1)
Basophils Relative: 0 %
Eosinophils Absolute: 0.2 10*3/uL (ref 0.0–0.5)
Eosinophils Relative: 3 %
HCT: 36.6 % (ref 36.0–46.0)
Hemoglobin: 11.7 g/dL — ABNORMAL LOW (ref 12.0–15.0)
Immature Granulocytes: 0 %
Lymphocytes Relative: 26 %
Lymphs Abs: 1.8 10*3/uL (ref 0.7–4.0)
MCH: 28.3 pg (ref 26.0–34.0)
MCHC: 32 g/dL (ref 30.0–36.0)
MCV: 88.4 fL (ref 80.0–100.0)
Monocytes Absolute: 0.4 10*3/uL (ref 0.1–1.0)
Monocytes Relative: 5 %
Neutro Abs: 4.6 10*3/uL (ref 1.7–7.7)
Neutrophils Relative %: 66 %
Platelets: 290 10*3/uL (ref 150–400)
RBC: 4.14 MIL/uL (ref 3.87–5.11)
RDW: 13.7 % (ref 11.5–15.5)
WBC: 7 10*3/uL (ref 4.0–10.5)
nRBC: 0 % (ref 0.0–0.2)

## 2021-07-12 LAB — COMPREHENSIVE METABOLIC PANEL
ALT: 13 U/L (ref 0–44)
AST: 13 U/L — ABNORMAL LOW (ref 15–41)
Albumin: 3.6 g/dL (ref 3.5–5.0)
Alkaline Phosphatase: 69 U/L (ref 38–126)
Anion gap: 10 (ref 5–15)
BUN: 11 mg/dL (ref 6–20)
CO2: 26 mmol/L (ref 22–32)
Calcium: 9.4 mg/dL (ref 8.9–10.3)
Chloride: 101 mmol/L (ref 98–111)
Creatinine, Ser: 0.94 mg/dL (ref 0.44–1.00)
GFR, Estimated: >60 mL/min (ref >60)
Glucose, Bld: 98 mg/dL (ref 70–99)
Potassium: 4.3 mmol/L (ref 3.5–5.1)
Sodium: 137 mmol/L (ref 135–145)
Total Bilirubin: 0.5 mg/dL (ref 0.3–1.2)
Total Protein: 7.2 g/dL (ref 6.5–8.1)

## 2021-07-12 LAB — TROPONIN I (HIGH SENSITIVITY)
Troponin I (High Sensitivity): <2 ng/L (ref <18)
Troponin I (High Sensitivity): 3 ng/L (ref <18)

## 2021-07-12 NOTE — ED Provider Notes (Signed)
[facility] EMERGENCY DEPARTMENT Provider Note   CSN: [id number] Arrival date & time: 07/12/21  4782     History Chief Complaint  Patient presents with   Chest Pain    [name] is a 50 y.o. female. With past medical history of schizophrenia, seizures, bipolar, hypertension presents to the emergency department with chest pain.   States that at 10 PM last night she began having intermittent, piercing, 7/10 left-sided chest pain that radiated to her right forearm and rib cage in her back."  She states that the pain intermittently lasted until around 3:57 AM when she called EMS.  Associated with lightheadedness and shortness of breath.  She states that she took 2 puffs of her inhaler which moderately improved her symptoms but still felt short of breath afterwards.  She denies any nausea or diaphoresis with episode.  She states that she is currently having dull chest pain.  She states that she has had chest pain like this before but it has been a long time.   Additionally she describes having abdominal pain describing it as "empty."  Again she denies nausea, vomiting or diarrhea.  She denies any recent fevers or sick contacts.   Denies SI/HI/AVH.  The history is provided by the patient.  Chest Pain Pain location:  L chest Pain quality: sharp   Pain radiates to:  R shoulder Pain severity:  Severe Onset quality:  Gradual Duration:  4 hours Timing:  Intermittent Progression:  Improving Chronicity:  Recurrent Context: eating   Relieved by:  None tried Exacerbated by: anxious thoughts. Ineffective treatments: vistaril (helped) Associated symptoms: abdominal pain and shortness of breath   Associated symptoms: no diaphoresis, no dizziness, no fever, no nausea, no palpitations and no vomiting       Past Medical History:  Diagnosis Date   Anxiety    Asthma    Bipolar 1 disorder ([place])    Depression    Gallstones 08/2020   Hypertension    Insomnia, persistent     Prediabetes    Schizophrenic disorder ([place])    Seizures ([place])     Patient Active Problem List   Diagnosis Date Noted   Bipolar disorder, manic ([place]) 02/02/2020   Encounter for medical clearance for patient hold    Prediabetes 08/26/2015   Essential hypertension 08/26/2015   Seizures ([place]) 08/26/2015   Noncompliance with treatment 07/16/2015   Schizoaffective disorder, bipolar type ([place]) 01/11/2015    Past Surgical History:  Procedure Laterality Date   BREAST LUMPECTOMY Left 07/2013   TONSILLECTOMY     TUBAL LIGATION  2002     OB History     Gravida  2   Para  2   Term  2   Preterm      AB      Living  2      SAB      IAB      Ectopic      Multiple      Live Births  2           Family History  Problem Relation Age of Onset   Depression Mother    Gout Mother    Cancer Father        prostate   Other Father        lung issue   Alcoholism Other    Heart attack Paternal Grandfather    Heart attack Paternal Grandmother    Heart attack Maternal Grandmother    Heart attack  Maternal Grandfather    Depression Son    Anxiety disorder Son     Social History   Tobacco Use   Smoking status: Former    Types: Cigarettes   Smokeless tobacco: Never  Vaping Use   Vaping Use: Never used  Substance Use Topics   Alcohol use: Not Currently   Drug use: No    Home Medications Prior to Admission medications   Medication Sig Start Date End Date Taking? Authorizing Provider  albuterol (VENTOLIN HFA) 108 (90 Base) MCG/ACT inhaler Inhale 2 puffs into the lungs every 6 (six) hours as needed for wheezing or shortness of breath. 11/06/20   [name], PA-C  atorvastatin (LIPITOR) 10 MG tablet Take 10 mg by mouth daily. 02/22/21   [provider]  budesonide-formoterol (SYMBICORT) 160-4.5 MCG/ACT inhaler Inhale 2 puffs into the lungs 2 (two) times daily. For Shortness of breath Patient not taking: No sig reported 02/06/20   Clapacs, [name], MD   fluticasone (FLONASE) 50 MCG/ACT nasal spray Place 2 sprays into both nostrils daily for 14 days. 02/28/20 03/13/20  Tedd Sias, PA  fluticasone (FLOVENT HFA) 44 MCG/ACT inhaler Inhale 2 puffs into the lungs 2 (two) times daily. For SOB 02/06/20   Clapacs, [name], MD  ibuprofen (ADVIL) 200 MG tablet Take 200 mg by mouth every 6 (six) hours as needed.    [provider]  paliperidone (INVEGA SUSTENNA) 234 MG/1.5ML SUSY injection Inject 234 mg into the muscle once for 1 dose. Patient taking differently: Inject 234 mg into the muscle once. Last dose 03/04/21 02/06/20 03/01/20  Clapacs, [name], MD  traZODone (DESYREL) 100 MG tablet Take 100 mg by mouth at bedtime. 02/19/20   [provider]  ferrous sulfate 325 (65 FE) MG tablet Take 1 tablet (325 mg total) by mouth daily with breakfast. Patient not taking: No sig reported 12/24/18 04/20/19  [name], MD    Allergies    Haldol [haloperidol decanoate], Penicillins, Pollen extract, and Shrimp [shellfish allergy]  Review of Systems   Review of Systems  Constitutional:  Negative for diaphoresis and fever.  Respiratory:  Positive for shortness of breath.   Cardiovascular:  Positive for chest pain. Negative for palpitations and leg swelling.  Gastrointestinal:  Positive for abdominal pain. Negative for nausea and vomiting.  Neurological:  Positive for light-headedness. Negative for dizziness and syncope.  All other systems reviewed and are negative.  Physical Exam Updated Vital Signs Temp 98.3 F (36.8 C) (Oral)   Ht 5\' 6"  (1.676 m)   Wt 119.3 kg   BMI 42.45 kg/m   Physical Exam Vitals and nursing note reviewed.  Constitutional:      General: She is not in acute distress.    Appearance: Normal appearance. She is well-developed. She is not ill-appearing or toxic-appearing.  HENT:     Head: Normocephalic and atraumatic.     Mouth/Throat:     Mouth: Mucous membranes are moist.     Pharynx: Oropharynx is clear. No  posterior oropharyngeal erythema.  Eyes:     General: No scleral icterus.    Extraocular Movements: Extraocular movements intact.     Pupils: Pupils are equal, round, and reactive to light.  Cardiovascular:     Rate and Rhythm: Normal rate and regular rhythm.     Pulses: Normal pulses.          Radial pulses are 2+ on the right side and 2+ on the left side.  Dorsalis pedis pulses are 2+ on the right side and 2+ on the left side.     Heart sounds: Normal heart sounds. No murmur heard. Pulmonary:     Effort: Pulmonary effort is normal. No respiratory distress.     Breath sounds: Normal breath sounds. No wheezing.  Abdominal:     General: Bowel sounds are normal. There is no distension.     Palpations: Abdomen is soft.     Tenderness: There is no abdominal tenderness.  Musculoskeletal:        General: Normal range of motion.     Cervical back: Normal range of motion and neck supple.     Right lower leg: No tenderness. No edema.     Left lower leg: No tenderness. No edema.  Skin:    General: Skin is warm and dry.     Capillary Refill: Capillary refill takes less than 2 seconds.  Neurological:     General: No focal deficit present.     Mental Status: She is alert and oriented to person, place, and time.  Psychiatric:        Mood and Affect: Mood normal.        Behavior: Behavior normal.    ED Results / Procedures / Treatments   Labs (all labs ordered are listed, but only abnormal results are displayed) Labs Reviewed  COMPREHENSIVE METABOLIC PANEL - Abnormal; Notable for the following components:      Result Value   AST 13 (*)    All other components within normal limits  CBC WITH DIFFERENTIAL/PLATELET - Abnormal; Notable for the following components:   Hemoglobin 11.7 (*)    All other components within normal limits  TROPONIN I (HIGH SENSITIVITY)  TROPONIN I (HIGH SENSITIVITY)    EKG EKG Interpretation  Date/Time:  Tuesday July 12 2021 06:44:21 EST Ventricular  Rate:  90 PR Interval:  144 QRS Duration: 86 QT Interval:  366 QTC Calculation: 448 R Axis:   -10 Text Interpretation: Sinus rhythm Low voltage, precordial leads Confirmed by Randal Buba, April ([id number]) on 07/12/2021 6:47:23 AM  Radiology DG Chest 2 View  Result Date: 07/12/2021 CLINICAL DATA:  50 year old female with left side chest pain and shortness of breath since 0300 hours. EXAM: CHEST - 2 VIEW COMPARISON:  Chest radiographs 04/27/2021 and earlier. FINDINGS: Lower lung volumes compared to 2019, similar to August. Mediastinal contours remain within normal limits. Visualized tracheal air column is within normal limits. No pneumothorax or pleural effusion. Both lungs remain clear. No acute osseous abnormality identified. Negative visible bowel gas. IMPRESSION: No cardiopulmonary abnormality. Electronically Signed   By: Genevie Ann M.D.   On: 07/12/2021 07:45    Procedures Procedures   Medications Ordered in ED Medications - No data to display  ED Course  I have reviewed the triage vital signs and the nursing notes.  Pertinent labs & imaging results that were available during my care of the patient were reviewed by me and considered in my medical decision making (see chart for details).  HEAR Score: 2   MDM Rules/Calculators/A&P 50 year old female who presents to the emergency department with chest pain.  Exam without evidence of volume overload so doubt heart failure.  EKG without ischemia or infarction, x2 troponin negative.  Presentations not consistent with an acute PE.  Chest x-ray without pneumothorax or obvious infiltrate.  Her presentation is also not consistent with an acute dissection, pericarditis, tamponade, pneumonia. Heart score 2. The does not have any chest pain at this moment and  is tolerating p.o. Her vital signs have been stable.  Structured to return to emergency department if she continues to have chest pain or worsening symptoms. Safe for discharge. Final Clinical  Impression(s) / ED Diagnoses Final diagnoses:  Chest pain, unspecified type    Rx / DC Orders ED Discharge Orders     None        Mickie Hillier, PA-C 07/12/21 1203    Lucrezia Starch, MD 07/14/21 [phone]

## 2021-07-12 NOTE — Discharge Instructions (Addendum)
You are seen in the emergency department today for chest pain.  While you are here did a thorough work-up which was all reassuring.  Please return to emergency department if you have new or worsening chest pain associated with shortness of breath, nausea or episodes of sweating.  Please follow-up with your primary care provider regarding your visit here to the emergency department.

## 2021-07-12 NOTE — ED Triage Notes (Signed)
Pt bib [facility] for chest pain starting 30 mins ago, described as "piercing." Pt states the pain is in the left side that radiates to her stomach and her leg that gets worse when she eats. VSS w/ ems.

## 2021-07-31 ENCOUNTER — Other Ambulatory Visit: Payer: Self-pay

## 2021-07-31 ENCOUNTER — Encounter (HOSPITAL_COMMUNITY): Payer: Self-pay | Admitting: Emergency Medicine

## 2021-07-31 ENCOUNTER — Emergency Department (HOSPITAL_COMMUNITY)
Admission: EM | Admit: 2021-07-31 | Discharge: 2021-08-02 | Disposition: A | Discharge status: Home / Self Care | Payer: Medicare Other | Attending: Emergency Medicine | Admitting: Emergency Medicine

## 2021-07-31 DIAGNOSIS — F250 Schizoaffective disorder, bipolar type: Secondary | ICD-10-CM | POA: Diagnosis not present

## 2021-07-31 DIAGNOSIS — Z79899 Other long term (current) drug therapy: Secondary | ICD-10-CM | POA: Insufficient documentation

## 2021-07-31 DIAGNOSIS — Z0279 Encounter for issue of other medical certificate: Secondary | ICD-10-CM | POA: Diagnosis present

## 2021-07-31 DIAGNOSIS — Z20822 Contact with and (suspected) exposure to covid-19: Secondary | ICD-10-CM | POA: Diagnosis not present

## 2021-07-31 DIAGNOSIS — Z87891 Personal history of nicotine dependence: Secondary | ICD-10-CM | POA: Insufficient documentation

## 2021-07-31 DIAGNOSIS — J45909 Unspecified asthma, uncomplicated: Secondary | ICD-10-CM | POA: Diagnosis not present

## 2021-07-31 DIAGNOSIS — I10 Essential (primary) hypertension: Secondary | ICD-10-CM | POA: Diagnosis not present

## 2021-07-31 LAB — RESP PANEL BY RT-PCR (FLU A&B, COVID) ARPGX2
Influenza A by PCR: NEGATIVE
Influenza B by PCR: NEGATIVE
SARS Coronavirus 2 by RT PCR: NEGATIVE

## 2021-07-31 LAB — CBC WITH DIFFERENTIAL/PLATELET
Abs Immature Granulocytes: 0.04 10*3/uL (ref 0.00–0.07)
Basophils Absolute: 0 10*3/uL (ref 0.0–0.1)
Basophils Relative: 0 %
Eosinophils Absolute: 0.2 10*3/uL (ref 0.0–0.5)
Eosinophils Relative: 3 %
HCT: 36.7 % (ref 36.0–46.0)
Hemoglobin: 11.7 g/dL — ABNORMAL LOW (ref 12.0–15.0)
Immature Granulocytes: 1 %
Lymphocytes Relative: 23 %
Lymphs Abs: 1.8 10*3/uL (ref 0.7–4.0)
MCH: 28.2 pg (ref 26.0–34.0)
MCHC: 31.9 g/dL (ref 30.0–36.0)
MCV: 88.4 fL (ref 80.0–100.0)
Monocytes Absolute: 0.5 10*3/uL (ref 0.1–1.0)
Monocytes Relative: 6 %
Neutro Abs: 5.1 10*3/uL (ref 1.7–7.7)
Neutrophils Relative %: 67 %
Platelets: 285 10*3/uL (ref 150–400)
RBC: 4.15 MIL/uL (ref 3.87–5.11)
RDW: 13.7 % (ref 11.5–15.5)
WBC: 7.6 10*3/uL (ref 4.0–10.5)
nRBC: 0 % (ref 0.0–0.2)

## 2021-07-31 LAB — ACETAMINOPHEN LEVEL: Acetaminophen (Tylenol), Serum: <10 ug/mL — ABNORMAL LOW (ref 10–30)

## 2021-07-31 LAB — COMPREHENSIVE METABOLIC PANEL
ALT: 13 U/L (ref 0–44)
AST: 15 U/L (ref 15–41)
Albumin: 3.8 g/dL (ref 3.5–5.0)
Alkaline Phosphatase: 73 U/L (ref 38–126)
Anion gap: 8 (ref 5–15)
BUN: 7 mg/dL (ref 6–20)
CO2: 27 mmol/L (ref 22–32)
Calcium: 9.3 mg/dL (ref 8.9–10.3)
Chloride: 101 mmol/L (ref 98–111)
Creatinine, Ser: 0.76 mg/dL (ref 0.44–1.00)
GFR, Estimated: >60 mL/min (ref >60)
Glucose, Bld: 102 mg/dL — ABNORMAL HIGH (ref 70–99)
Potassium: 3.9 mmol/L (ref 3.5–5.1)
Sodium: 136 mmol/L (ref 135–145)
Total Bilirubin: 0.5 mg/dL (ref 0.3–1.2)
Total Protein: 7.4 g/dL (ref 6.5–8.1)

## 2021-07-31 LAB — RAPID URINE DRUG SCREEN, HOSP PERFORMED
Amphetamines: NOT DETECTED
Barbiturates: NOT DETECTED
Benzodiazepines: NOT DETECTED
Cocaine: NOT DETECTED
Opiates: NOT DETECTED
Tetrahydrocannabinol: NOT DETECTED

## 2021-07-31 LAB — SALICYLATE LEVEL: Salicylate Lvl: <7 mg/dL — ABNORMAL LOW (ref 7.0–30.0)

## 2021-07-31 LAB — I-STAT BETA HCG BLOOD, ED (MC, WL, AP ONLY): I-stat hCG, quantitative: <5 m[IU]/mL (ref <5)

## 2021-07-31 LAB — ETHANOL: Alcohol, Ethyl (B): <10 mg/dL (ref <10)

## 2021-07-31 NOTE — ED Provider Notes (Signed)
Emergency Medicine Provider Triage Evaluation Note  [name] , a 50 y.o. female  was evaluated in triage.  Pt complains of feeling unwell.  She states that she has feelings of being suicidal.  She is very sporadic with her speech and is all over the place.  She also states that she is to get out of the place where she is living and needs to be placed somewhere else. She has a history of bipolar manic disorder and schizoaffective bipolar type.  Review of Systems  Positive: See above Negative:   Physical Exam  BP (!) 127/99 (BP Location: Right Arm)   Pulse (!) 117   Temp 98.4 F (36.9 C)   Resp 20   SpO2 98%  Gen:   Awake, no distress   Resp:  Normal effort  MSK:   Moves extremities without difficulty  Other:  Patient with flight of ideas, suicidal thoughts, difficult to orient  Medical Decision Making  Medically screening exam initiated at 10:11 PM.  Appropriate orders placed.  [name] was informed that the remainder of the evaluation will be completed by another provider, this initial triage assessment does not replace that evaluation, and the importance of remaining in the ED until their evaluation is complete.  Medical clearance labs   [name] 07/31/21 2213    Yvette Freeze, MD 07/31/21 [phone]

## 2021-07-31 NOTE — ED Triage Notes (Signed)
Pt with multiple complaints, flight of ideas, suicidal thoughts. States she has no where to go and "needs to be placed somewhere"

## 2021-08-01 NOTE — ED Notes (Signed)
Pt moved back to H20 from 37 at this time. Sitter remains at bedside. TTS is completed

## 2021-08-01 NOTE — ED Notes (Signed)
Pt taking a shower after expressing she believes she smells like alcohol. Pt states she doesn't drink so she doesn't understand why and also thinks she was smoking while sleeping. Will continue to monitor pt.

## 2021-08-01 NOTE — ED Notes (Signed)
Pt waiting for tts to start

## 2021-08-01 NOTE — ED Notes (Signed)
Pt given coca cola and 2 packs of graham crackers.
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
## 2021-08-01 NOTE — ED Provider Notes (Signed)
[facility] EMERGENCY DEPARTMENT Provider Note   CSN: [id number] Arrival date & time: 07/31/21  2122     History Chief Complaint  Patient presents with   Medical Clearance    [name] is a 50 y.o. female.  50 yo F with a cc of not feeling well.  Patient was found to be talking bizarrely and was brought into the ED.  She has a history of schizoaffective disorder.  She tells me that she has some trouble with her mentation but it is due to her time in the [employer].  She was concerned that she may be had COVID.  Tells me that she tested negative couple times.  Level 5 caveat mental illness.  The history is provided by the patient.  Illness Severity:  Moderate Onset quality:  Gradual Duration:  2 days     Past Medical History:  Diagnosis Date   Anxiety    Asthma    Bipolar 1 disorder ([place])    Depression    Gallstones 08/2020   Hypertension    Insomnia, persistent    Prediabetes    Schizophrenic disorder ([place])    Seizures ([place])     Patient Active Problem List   Diagnosis Date Noted   Bipolar disorder, manic ([place]) 02/02/2020   Encounter for medical clearance for patient hold    Prediabetes 08/26/2015   Essential hypertension 08/26/2015   Seizures ([place]) 08/26/2015   Noncompliance with treatment 07/16/2015   Schizoaffective disorder, bipolar type ([place]) 01/11/2015    Past Surgical History:  Procedure Laterality Date   BREAST LUMPECTOMY Left 07/2013   TONSILLECTOMY     TUBAL LIGATION  2002     OB History     Gravida  2   Para  2   Term  2   Preterm      AB      Living  2      SAB      IAB      Ectopic      Multiple      Live Births  2           Family History  Problem Relation Age of Onset   Depression Mother    Gout Mother    Cancer Father        prostate   Other Father        lung issue   Alcoholism Other    Heart attack Paternal Grandfather    Heart attack Paternal Grandmother    Heart attack Maternal  Grandmother    Heart attack Maternal Grandfather    Depression Son    Anxiety disorder Son     Social History   Tobacco Use   Smoking status: Former    Types: Cigarettes   Smokeless tobacco: Never  Vaping Use   Vaping Use: Never used  Substance Use Topics   Alcohol use: Not Currently   Drug use: No    Home Medications Prior to Admission medications   Medication Sig Start Date End Date Taking? Authorizing Provider  albuterol (VENTOLIN HFA) 108 (90 Base) MCG/ACT inhaler Inhale 2 puffs into the lungs every 6 (six) hours as needed for wheezing or shortness of breath. 11/06/20   [name], PA-C  Ascorbic Acid (VITAMIN C WITH ROSE HIPS) 500 MG tablet Take 1,000 mg by mouth daily.    [provider]  atorvastatin (LIPITOR) 10 MG tablet Take 10 mg by mouth daily. 02/22/21   [provider]  budesonide-formoterol (SYMBICORT) 160-4.5 MCG/ACT inhaler Inhale 2 puffs into the lungs 2 (two) times daily. For Shortness of breath 02/06/20   Clapacs, [name], MD  fluticasone (FLONASE) 50 MCG/ACT nasal spray Place 2 sprays into both nostrils daily for 14 days. Patient not taking: Reported on 07/12/2021 02/28/20 03/13/20  Tedd Sias, PA  fluticasone (FLOVENT HFA) 44 MCG/ACT inhaler Inhale 2 puffs into the lungs 2 (two) times daily. For SOB Patient not taking: Reported on 07/12/2021 02/06/20   Clapacs, [name], MD  hydrOXYzine (VISTARIL) 25 MG capsule Take 25 mg by mouth 3 (three) times daily. 06/26/21   [provider]  ibuprofen (ADVIL) 200 MG tablet Take 400 mg by mouth every 6 (six) hours as needed for moderate pain or headache.    [provider]  paliperidone (INVEGA SUSTENNA) 234 MG/1.5ML SUSY injection Inject 234 mg into the muscle once for 1 dose. Patient taking differently: Inject 234 mg into the muscle every 21 ( twenty-one) days. 02/06/20 07/12/21  Clapacs, [name], MD  traZODone (DESYREL) 100 MG tablet Take 100 mg by mouth at bedtime as needed for  sleep. 02/19/20   [provider]  ferrous sulfate 325 (65 FE) MG tablet Take 1 tablet (325 mg total) by mouth daily with breakfast. Patient not taking: No sig reported 12/24/18 04/20/19  [name], MD    Allergies    Haldol [haloperidol decanoate], Penicillins, Pollen extract, and Shrimp [shellfish allergy]  Review of Systems   Review of Systems  Unable to perform ROS: Psychiatric disorder   Physical Exam Updated Vital Signs BP 117/84   Pulse 88   Temp 97.8 F (36.6 C)   Resp 17   SpO2 98%   Physical Exam Vitals and nursing note reviewed.  Constitutional:      General: She is not in acute distress.    Appearance: She is well-developed. She is not diaphoretic.  HENT:     Head: Normocephalic and atraumatic.  Eyes:     Pupils: Pupils are equal, round, and reactive to light.  Cardiovascular:     Rate and Rhythm: Normal rate and regular rhythm.     Heart sounds: No murmur heard.   No friction rub. No gallop.  Pulmonary:     Effort: Pulmonary effort is normal.     Breath sounds: No wheezing or rales.  Abdominal:     General: There is no distension.     Palpations: Abdomen is soft.     Tenderness: There is no abdominal tenderness.  Musculoskeletal:        General: No tenderness.     Cervical back: Normal range of motion and neck supple.  Skin:    General: Skin is warm and dry.  Neurological:     Mental Status: She is alert.     Comments: Patient know she is in the hospital talks about differing things spontaneously.  Psychiatric:        Mood and Affect: Affect is blunt and flat.        Speech: Speech is tangential.        Behavior: Behavior normal.    ED Results / Procedures / Treatments   Labs (all labs ordered are listed, but only abnormal results are displayed) Labs Reviewed  COMPREHENSIVE METABOLIC PANEL - Abnormal; Notable for the following components:      Result Value   Glucose, Bld 102 (*)    All other components within normal limits  CBC WITH  DIFFERENTIAL/PLATELET - Abnormal; Notable for  the following components:   Hemoglobin 11.7 (*)    All other components within normal limits  SALICYLATE LEVEL - Abnormal; Notable for the following components:   Salicylate Lvl <1.8 (*)    All other components within normal limits  ACETAMINOPHEN LEVEL - Abnormal; Notable for the following components:   Acetaminophen (Tylenol), Serum <10 (*)    All other components within normal limits  RESP PANEL BY RT-PCR (FLU A&B, COVID) ARPGX2  ETHANOL  RAPID URINE DRUG SCREEN, HOSP PERFORMED  I-STAT BETA HCG BLOOD, ED ([facility], [facility], [facility] ONLY)    EKG None  Radiology No results found.  Procedures Procedures   Medications Ordered in ED Medications - No data to display  ED Course  I have reviewed the triage vital signs and the nursing notes.  Pertinent labs & imaging results that were available during my care of the patient were reviewed by me and considered in my medical decision making (see chart for details).    MDM Rules/Calculators/A&P                           50 yo F with a cc of mental illness.  Patient with hx of same.  Labs ordered in MSE process without acute finding.  I feel she is medically clear.  TTS evaluation.  The patients results and plan were reviewed and discussed.   Any x-rays performed were independently reviewed by myself.   Differential diagnosis were considered with the presenting HPI.  Medications - No data to display  Vitals:   07/31/21 2100 08/01/21 0646  BP: (!) 127/99 117/84  Pulse: (!) 117 88  Resp: 20 17  Temp: 98.4 F (36.9 C) 97.8 F (36.6 C)  SpO2: 98% 98%    Final diagnoses:  Schizoaffective disorder, bipolar type [facility])    Final Clinical Impression(s) / ED Diagnoses Final diagnoses:  Schizoaffective disorder, bipolar type [facility])    Rx / DC Orders ED Discharge Orders     None        Deno Etienne, DO 08/01/21 8416

## 2021-08-01 NOTE — ED Notes (Signed)
Received verbal report from [place] at this time

## 2021-08-01 NOTE — ED Notes (Signed)
Remains in 37 for tts at this time. Sitter remains outside of door

## 2021-08-01 NOTE — ED Notes (Signed)
Pt awake eating lunch and speaking to pharmacist.

## 2021-08-01 NOTE — ED Notes (Signed)
Pt moved to 37 for TTS at this time

## 2021-08-02 DIAGNOSIS — F250 Schizoaffective disorder, bipolar type: Secondary | ICD-10-CM

## 2021-08-02 MED ORDER — TRAZODONE HCL 100 MG PO TABS: 100.0000 mg | ORAL_TABLET | Freq: Every evening | ORAL | Status: DC | PRN

## 2021-08-02 MED ORDER — HYDROXYZINE HCL 10 MG PO TABS
25.0000 mg | ORAL_TABLET | Freq: Three times a day (TID) | ORAL | Status: DC
  Administered 2021-08-02: 25 mg via ORAL
  Filled 2021-08-02 (×4): qty 3

## 2021-08-02 NOTE — ED Provider Notes (Signed)
Patient was reevaluated by the psych team and social work and felt to be reasonably stable for outpatient discharge and follow-up at [facility].   Wyvonnia Dusky, MD 08/02/21 1300

## 2021-08-02 NOTE — ED Notes (Signed)
Reviewed discharge instructions with patient. Follow-up care reviewed. Patient verbalized understanding. Patient A&Ox4, VSS, and ambulatory with steady gait upon discharge.  

## 2021-08-02 NOTE — Discharge Instructions (Addendum)
Patient has been psych cleared and recommended to re-establish and continue services with [facility] in their [place], [place] location. Patient is highly encouraged to follow up with her assigned [name] team to continue intensive services.   [facility]  [address] [place]  [place], [place] [address] [phone]

## 2021-08-02 NOTE — Progress Notes (Signed)
CSW contacted [employer] and requested to speak with someone in their [name] team department, but was unable to do so. CSW left a message with the operator requesting a call back.    Mariea Clonts, MSW, LCSW-A  11:19 AM 08/02/2021
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
## 2021-08-02 NOTE — ED Notes (Signed)
Pt woke up to ask about being able to leave. Pt states that she is here voluntarily and would like to sign a 72 hour "thing" to be able to leave. Informed pt that TTS wanted pt to stay overnight and be re-evaluated in the am for possible D/C. Pt agrees to stay and rest for the night and re-eval in the am. Pt offered water or blanket, pt declined. Will continue to monitor.

## 2021-08-02 NOTE — ED Provider Notes (Signed)
Emergency Medicine Observation Re-evaluation Note  [name] is a 50 y.o. female, seen on rounds today.  Pt initially presented to the ED for complaints of psychiatric disturbance Currently, the patient is resting comfortably.  Physical Exam  BP 123/81   Pulse (!) 102   Temp 98.1 F (36.7 C) (Oral)   Resp 16   SpO2 98%  Physical Exam General: NAD  Cardiac: Regular HR Lungs: No respiratory distress Psych: Stable  ED Course / MDM  EKG:   I have reviewed the labs performed to date as well as medications administered while in observation.    Plan  Current plan is for TTS re-evaluation, per yesterday's assessment they would like to confer with her ACT team regarding her baseline mental state.   [name] is not under involuntary commitment. She has been medically cleared.     Wyvonnia Dusky, MD 08/02/21 [phone]

## 2021-08-02 NOTE — ED Notes (Signed)
2 bags of pt belongings located in locker room on table. Belongings given to pt.

## 2021-08-11 ENCOUNTER — Emergency Department (HOSPITAL_COMMUNITY)
Admission: EM | Admit: 2021-08-11 | Discharge: 2021-08-11 | Disposition: A | Discharge status: Home / Self Care | Payer: Medicare Other | Source: Home / Self Care | Attending: Emergency Medicine | Admitting: Emergency Medicine

## 2021-08-11 ENCOUNTER — Encounter (HOSPITAL_COMMUNITY): Payer: Self-pay

## 2021-08-11 ENCOUNTER — Inpatient Hospital Stay (HOSPITAL_COMMUNITY)
Admission: AD | Admit: 2021-08-11 | Discharge: 2021-08-25 | DRG: 885 | Disposition: A | Discharge status: Home / Self Care | Payer: Medicare Other | Source: Intra-hospital | Attending: Psychiatry | Admitting: Psychiatry

## 2021-08-11 ENCOUNTER — Encounter (HOSPITAL_COMMUNITY): Payer: Self-pay | Admitting: Family

## 2021-08-11 ENCOUNTER — Other Ambulatory Visit: Payer: Self-pay

## 2021-08-11 DIAGNOSIS — E785 Hyperlipidemia, unspecified: Secondary | ICD-10-CM | POA: Diagnosis present

## 2021-08-11 DIAGNOSIS — Z91410 Personal history of adult physical and sexual abuse: Secondary | ICD-10-CM

## 2021-08-11 DIAGNOSIS — R45851 Suicidal ideations: Secondary | ICD-10-CM | POA: Diagnosis present

## 2021-08-11 DIAGNOSIS — R079 Chest pain, unspecified: Secondary | ICD-10-CM | POA: Diagnosis not present

## 2021-08-11 DIAGNOSIS — R0600 Dyspnea, unspecified: Secondary | ICD-10-CM | POA: Diagnosis not present

## 2021-08-11 DIAGNOSIS — Z885 Allergy status to narcotic agent status: Secondary | ICD-10-CM

## 2021-08-11 DIAGNOSIS — R2681 Unsteadiness on feet: Secondary | ICD-10-CM | POA: Diagnosis not present

## 2021-08-11 DIAGNOSIS — R7303 Prediabetes: Secondary | ICD-10-CM | POA: Diagnosis present

## 2021-08-11 DIAGNOSIS — F201 Disorganized schizophrenia: Secondary | ICD-10-CM | POA: Insufficient documentation

## 2021-08-11 DIAGNOSIS — Z9151 Personal history of suicidal behavior: Secondary | ICD-10-CM

## 2021-08-11 DIAGNOSIS — Z87891 Personal history of nicotine dependence: Secondary | ICD-10-CM

## 2021-08-11 DIAGNOSIS — I10 Essential (primary) hypertension: Secondary | ICD-10-CM | POA: Diagnosis present

## 2021-08-11 DIAGNOSIS — Z79899 Other long term (current) drug therapy: Secondary | ICD-10-CM

## 2021-08-11 DIAGNOSIS — Z7951 Long term (current) use of inhaled steroids: Secondary | ICD-10-CM

## 2021-08-11 DIAGNOSIS — M542 Cervicalgia: Secondary | ICD-10-CM | POA: Diagnosis present

## 2021-08-11 DIAGNOSIS — G8929 Other chronic pain: Secondary | ICD-10-CM | POA: Diagnosis present

## 2021-08-11 DIAGNOSIS — Z8042 Family history of malignant neoplasm of prostate: Secondary | ICD-10-CM

## 2021-08-11 DIAGNOSIS — F600 Paranoid personality disorder: Secondary | ICD-10-CM | POA: Diagnosis present

## 2021-08-11 DIAGNOSIS — Z8249 Family history of ischemic heart disease and other diseases of the circulatory system: Secondary | ICD-10-CM | POA: Diagnosis not present

## 2021-08-11 DIAGNOSIS — Z91013 Allergy to seafood: Secondary | ICD-10-CM | POA: Diagnosis not present

## 2021-08-11 DIAGNOSIS — N9489 Other specified conditions associated with female genital organs and menstrual cycle: Secondary | ICD-10-CM | POA: Insufficient documentation

## 2021-08-11 DIAGNOSIS — G4710 Hypersomnia, unspecified: Secondary | ICD-10-CM | POA: Diagnosis present

## 2021-08-11 DIAGNOSIS — Z818 Family history of other mental and behavioral disorders: Secondary | ICD-10-CM

## 2021-08-11 DIAGNOSIS — F4310 Post-traumatic stress disorder, unspecified: Secondary | ICD-10-CM | POA: Diagnosis present

## 2021-08-11 DIAGNOSIS — Z23 Encounter for immunization: Secondary | ICD-10-CM | POA: Diagnosis present

## 2021-08-11 DIAGNOSIS — F419 Anxiety disorder, unspecified: Secondary | ICD-10-CM | POA: Diagnosis present

## 2021-08-11 DIAGNOSIS — R569 Unspecified convulsions: Secondary | ICD-10-CM | POA: Diagnosis not present

## 2021-08-11 DIAGNOSIS — Z880 Allergy status to penicillin: Secondary | ICD-10-CM

## 2021-08-11 DIAGNOSIS — F209 Schizophrenia, unspecified: Secondary | ICD-10-CM | POA: Insufficient documentation

## 2021-08-11 DIAGNOSIS — G4700 Insomnia, unspecified: Secondary | ICD-10-CM | POA: Diagnosis present

## 2021-08-11 DIAGNOSIS — D649 Anemia, unspecified: Secondary | ICD-10-CM | POA: Diagnosis present

## 2021-08-11 DIAGNOSIS — K5900 Constipation, unspecified: Secondary | ICD-10-CM | POA: Diagnosis present

## 2021-08-11 DIAGNOSIS — J45909 Unspecified asthma, uncomplicated: Secondary | ICD-10-CM | POA: Diagnosis present

## 2021-08-11 DIAGNOSIS — F250 Schizoaffective disorder, bipolar type: Principal | ICD-10-CM | POA: Diagnosis present

## 2021-08-11 DIAGNOSIS — Z20822 Contact with and (suspected) exposure to covid-19: Secondary | ICD-10-CM | POA: Insufficient documentation

## 2021-08-11 LAB — COMPREHENSIVE METABOLIC PANEL
ALT: 16 U/L (ref 0–44)
AST: 16 U/L (ref 15–41)
Albumin: 4 g/dL (ref 3.5–5.0)
Alkaline Phosphatase: 72 U/L (ref 38–126)
Anion gap: 8 (ref 5–15)
BUN: 13 mg/dL (ref 6–20)
CO2: 26 mmol/L (ref 22–32)
Calcium: 9.3 mg/dL (ref 8.9–10.3)
Chloride: 104 mmol/L (ref 98–111)
Creatinine, Ser: 0.83 mg/dL (ref 0.44–1.00)
GFR, Estimated: >60 mL/min (ref >60)
Glucose, Bld: 106 mg/dL — ABNORMAL HIGH (ref 70–99)
Potassium: 3.7 mmol/L (ref 3.5–5.1)
Sodium: 138 mmol/L (ref 135–145)
Total Bilirubin: 0.4 mg/dL (ref 0.3–1.2)
Total Protein: 7.8 g/dL (ref 6.5–8.1)

## 2021-08-11 LAB — RAPID URINE DRUG SCREEN, HOSP PERFORMED
Amphetamines: NOT DETECTED
Barbiturates: NOT DETECTED
Benzodiazepines: NOT DETECTED
Cocaine: NOT DETECTED
Opiates: NOT DETECTED
Tetrahydrocannabinol: NOT DETECTED

## 2021-08-11 LAB — CBC
HCT: 33.7 % — ABNORMAL LOW (ref 36.0–46.0)
Hemoglobin: 10.9 g/dL — ABNORMAL LOW (ref 12.0–15.0)
MCH: 28.5 pg (ref 26.0–34.0)
MCHC: 32.3 g/dL (ref 30.0–36.0)
MCV: 88.2 fL (ref 80.0–100.0)
Platelets: 263 10*3/uL (ref 150–400)
RBC: 3.82 MIL/uL — ABNORMAL LOW (ref 3.87–5.11)
RDW: 13.9 % (ref 11.5–15.5)
WBC: 6.4 10*3/uL (ref 4.0–10.5)
nRBC: 0 % (ref 0.0–0.2)

## 2021-08-11 LAB — RESP PANEL BY RT-PCR (FLU A&B, COVID) ARPGX2
Influenza A by PCR: NEGATIVE
Influenza B by PCR: NEGATIVE
SARS Coronavirus 2 by RT PCR: NEGATIVE

## 2021-08-11 LAB — I-STAT BETA HCG BLOOD, ED (MC, WL, AP ONLY): I-stat hCG, quantitative: <5 m[IU]/mL (ref <5)

## 2021-08-11 LAB — ETHANOL: Alcohol, Ethyl (B): <10 mg/dL (ref <10)

## 2021-08-11 LAB — SALICYLATE LEVEL: Salicylate Lvl: <7 mg/dL — ABNORMAL LOW (ref 7.0–30.0)

## 2021-08-11 LAB — ACETAMINOPHEN LEVEL: Acetaminophen (Tylenol), Serum: <10 ug/mL — ABNORMAL LOW (ref 10–30)

## 2021-08-11 MED ORDER — ACETAMINOPHEN 325 MG PO TABS: 650.0000 mg | ORAL_TABLET | ORAL | Status: DC | PRN

## 2021-08-11 MED ORDER — HYDROXYZINE HCL 25 MG PO TABS
25.0000 mg | ORAL_TABLET | Freq: Three times a day (TID) | ORAL | Status: DC
  Administered 2021-08-11 – 2021-08-23 (×35): 25 mg via ORAL
  Filled 2021-08-11 (×43): qty 1

## 2021-08-11 MED ORDER — IBUPROFEN 400 MG PO TABS
400.0000 mg | ORAL_TABLET | Freq: Four times a day (QID) | ORAL | Status: DC | PRN
  Administered 2021-08-12 – 2021-08-16 (×6): 400 mg via ORAL
  Filled 2021-08-11 (×8): qty 1

## 2021-08-11 MED ORDER — ALUM & MAG HYDROXIDE-SIMETH 200-200-20 MG/5ML PO SUSP: 30.0000 mL | Freq: Four times a day (QID) | ORAL | Status: DC | PRN

## 2021-08-11 MED ORDER — TRAZODONE HCL 100 MG PO TABS
100.0000 mg | ORAL_TABLET | Freq: Every evening | ORAL | Status: DC | PRN
  Administered 2021-08-12 – 2021-08-14 (×4): 100 mg via ORAL
  Filled 2021-08-11 (×4): qty 1

## 2021-08-11 MED ORDER — INFLUENZA VAC SPLIT QUAD 0.5 ML IM SUSY
0.5000 mL | PREFILLED_SYRINGE | INTRAMUSCULAR | Status: AC
  Administered 2021-08-12: 0.5 mL via INTRAMUSCULAR
  Filled 2021-08-11: qty 0.5

## 2021-08-11 MED ORDER — MOMETASONE FURO-FORMOTEROL FUM 200-5 MCG/ACT IN AERO
2.0000 | INHALATION_SPRAY | Freq: Two times a day (BID) | RESPIRATORY_TRACT | Status: DC
  Administered 2021-08-12 – 2021-08-25 (×23): 2 via RESPIRATORY_TRACT
  Filled 2021-08-11 (×2): qty 8.8

## 2021-08-11 MED ORDER — MAGNESIUM HYDROXIDE 400 MG/5ML PO SUSP: 30.0000 mL | Freq: Every day | ORAL | Status: DC | PRN

## 2021-08-11 MED ORDER — ATORVASTATIN CALCIUM 10 MG PO TABS
10.0000 mg | ORAL_TABLET | Freq: Every day | ORAL | Status: DC
  Administered 2021-08-11 – 2021-08-25 (×15): 10 mg via ORAL
  Filled 2021-08-11 (×17): qty 1

## 2021-08-11 MED ORDER — ASCORBIC ACID 500 MG PO TABS
1000.0000 mg | ORAL_TABLET | Freq: Every day | ORAL | Status: DC
  Administered 2021-08-11 – 2021-08-25 (×15): 1000 mg via ORAL
  Filled 2021-08-11 (×18): qty 2
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
## 2021-08-11 NOTE — ED Provider Notes (Signed)
[facility] DEPT Provider Note   CSN: [id number] Arrival date & time: 08/11/21  0219     History Chief Complaint  Patient presents with   Suicidal    [name] is a 50 y.o. female with a hx of bipoalr 1 disorder, schizoaffective disorder, depression, seizures, and hypertension who presents to the ED with complaints of SI. Patient reports she recently had a birthday, she has been feeling depressed, reports that she has had suicidal ideation without specific attempt/plan.  She then starts to tell me about how she is being targeted for political and sexuality experience.  Reports it has to do with her place of residence.  She is not having any pain currently.  No alleviating or aggravating factors.  Denies homicidal ideation.  HPI     Past Medical History:  Diagnosis Date   Anxiety    Asthma    Bipolar 1 disorder ([place])    Depression    Gallstones 08/2020   Hypertension    Insomnia, persistent    Prediabetes    Schizophrenic disorder ([place])    Seizures ([place])     Patient Active Problem List   Diagnosis Date Noted   Bipolar disorder, manic ([place]) 02/02/2020   Encounter for medical clearance for patient hold    Prediabetes 08/26/2015   Essential hypertension 08/26/2015   Seizures ([place]) 08/26/2015   Noncompliance with treatment 07/16/2015   Schizoaffective disorder, bipolar type ([place]) 01/11/2015    Past Surgical History:  Procedure Laterality Date   BREAST LUMPECTOMY Left 07/2013   TONSILLECTOMY     TUBAL LIGATION  2002     OB History     Gravida  2   Para  2   Term  2   Preterm      AB      Living  2      SAB      IAB      Ectopic      Multiple      Live Births  2           Family History  Problem Relation Age of Onset   Depression Mother    Gout Mother    Cancer Father        prostate   Other Father        lung issue   Alcoholism Other    Heart attack Paternal Grandfather    Heart attack Paternal  Grandmother    Heart attack Maternal Grandmother    Heart attack Maternal Grandfather    Depression Son    Anxiety disorder Son     Social History   Tobacco Use   Smoking status: Former    Types: Cigarettes   Smokeless tobacco: Never  Vaping Use   Vaping Use: Never used  Substance Use Topics   Alcohol use: Not Currently   Drug use: No    Home Medications Prior to Admission medications   Medication Sig Start Date End Date Taking? Authorizing Provider  Ascorbic Acid (VITAMIN C WITH ROSE HIPS) 500 MG tablet Take 1,000 mg by mouth daily.    [provider]  atorvastatin (LIPITOR) 10 MG tablet Take 10 mg by mouth daily. 02/22/21   [provider]  budesonide-formoterol (SYMBICORT) 160-4.5 MCG/ACT inhaler Inhale 2 puffs into the lungs 2 (two) times daily. For Shortness of breath 02/06/20   Clapacs, [name], MD  hydrOXYzine (VISTARIL) 25 MG capsule Take 25 mg by mouth 3 (three) times daily. 06/26/21  [provider]  ibuprofen (ADVIL) 200 MG tablet Take 400 mg by mouth every 6 (six) hours as needed for moderate pain or headache.    [provider]  paliperidone (INVEGA SUSTENNA) 234 MG/1.5ML SUSY injection Inject 234 mg into the muscle once for 1 dose. Patient taking differently: Inject 234 mg into the muscle every 21 ( twenty-one) days. 02/06/20 08/01/21  Clapacs, [name], MD  traZODone (DESYREL) 100 MG tablet Take 100 mg by mouth at bedtime as needed for sleep. 02/19/20   [provider]  ferrous sulfate 325 (65 FE) MG tablet Take 1 tablet (325 mg total) by mouth daily with breakfast. Patient not taking: No sig reported 12/24/18 04/20/19  [name], MD    Allergies    Haldol [haloperidol decanoate], Penicillins, Pollen extract, and Shrimp [shellfish allergy]  Review of Systems   Review of Systems  Constitutional:  Negative for chills and fever.  Respiratory:  Negative for shortness of breath.   Cardiovascular:  Negative for chest pain.   Gastrointestinal:  Negative for nausea and vomiting.  Neurological:  Negative for syncope.  Psychiatric/Behavioral:  Positive for suicidal ideas.   All other systems reviewed and are negative.  Physical Exam Updated Vital Signs BP (!) 152/95 (BP Location: Right Arm)    Pulse 95    Temp 97.9 F (36.6 C) (Oral)    Resp 20    Ht 5\' 6"  (1.676 m)    Wt 120 kg    SpO2 100%    BMI 42.70 kg/m   Physical Exam Vitals and nursing note reviewed.  Constitutional:      General: She is not in acute distress.    Appearance: She is well-developed. She is not toxic-appearing.  HENT:     Head: Normocephalic and atraumatic.  Eyes:     General:        Right eye: No discharge.        Left eye: No discharge.     Conjunctiva/sclera: Conjunctivae normal.  Cardiovascular:     Rate and Rhythm: Normal rate and regular rhythm.  Pulmonary:     Effort: Pulmonary effort is normal. No respiratory distress.     Breath sounds: Normal breath sounds. No wheezing, rhonchi or rales.  Abdominal:     General: There is no distension.     Palpations: Abdomen is soft.     Tenderness: There is no abdominal tenderness.  Musculoskeletal:     Cervical back: Neck supple.  Skin:    General: Skin is warm and dry.     Findings: No rash.  Neurological:     Mental Status: She is alert.     Comments: Clear speech.   Psychiatric:        Speech: Speech is rapid and pressured and tangential.        Thought Content: Thought content includes suicidal ideation.    ED Results / Procedures / Treatments   Labs (all labs ordered are listed, but only abnormal results are displayed) Labs Reviewed  COMPREHENSIVE METABOLIC PANEL - Abnormal; Notable for the following components:      Result Value   Glucose, Bld 106 (*)    All other components within normal limits  SALICYLATE LEVEL - Abnormal; Notable for the following components:   Salicylate Lvl <8.6 (*)    All other components within normal limits  ACETAMINOPHEN LEVEL -  Abnormal; Notable for the following components:   Acetaminophen (Tylenol), Serum <10 (*)    All other components within normal  limits  CBC - Abnormal; Notable for the following components:   RBC 3.82 (*)    Hemoglobin 10.9 (*)    HCT 33.7 (*)    All other components within normal limits  RESP PANEL BY RT-PCR (FLU A&B, COVID) ARPGX2  ETHANOL  RAPID URINE DRUG SCREEN, HOSP PERFORMED  I-STAT BETA HCG BLOOD, ED ([facility], [facility], [facility] ONLY)    EKG None  Radiology No results found.  Procedures Procedures   Medications Ordered in ED Medications - No data to display  ED Course  I have reviewed the triage vital signs and the nursing notes.  Pertinent labs & imaging results that were available during my care of the patient were reviewed by me and considered in my medical decision making (see chart for details).    MDM Rules/Calculators/A&P                           Patient presents to the ED for behavioral health assessment with SI. Nontoxic, vitals w/ elevated BP- doubt HTN emergency. Patient suicidal, tangential with rapid speech on assessment, fixating on political/sexuality experiments at her place of living.   Additional history obtained:  Additional history obtained from chart review & nursing note review.   Lab Tests:  Screening labs have been reviewed including CBC, CMP, acetaminophen/salicylate/ethanol level, UDS: mild anemia  ED Course:  Patient is medically cleared. Consult placed to TTS. Disposition per [facility].   The patient has been placed in psychiatric observation due to the need to provide a safe environment for the patient while obtaining psychiatric consultation and evaluation, as well as ongoing medical and medication management to treat the patient's condition.  The patient has not been placed under full IVC at this time.  Pending home med rec.  Portions of this note were generated with Lobby[employer]. Dictation errors may occur despite best attempts at  proofreading.  Final Clinical Impression(s) / ED Diagnoses Final diagnoses:  Suicidal ideation    Rx / DC Orders ED Discharge Orders     None        Amaryllis Dyke, PA-C 08/11/21 [id number]    Veryl Speak, MD 08/12/21 [phone]
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
## 2021-08-11 NOTE — Progress Notes (Signed)
Admission Note: Patient is a 50 year old female admitted to the unit voluntarily from [facility] for disorganized and paranoid behaviors.  Patient is alert and oriented to person, place and time.  Patient presents with anxious affect and mood.  Stated she is here to find stable housing and a positive interaction with family.  Reports stressors to include facing eviction from her apartment due to her payee not paying her rent.  Patient stated she stop taking her mental health medications due to side effects.  Admission plan of care reviewed with consent signed.  Skin and personal belongings completed.  Skin is dry and intact.  No contraband found.  Patient oriented to the unit, staff and room.  Routine safety checks initiated.  Verbalizes understanding of unit rules/protocols.  Patient is safe on the unit.

## 2021-08-11 NOTE — ED Notes (Signed)
Pt belongings sent with pt with safe transport.

## 2021-08-11 NOTE — BH Assessment (Addendum)
[place] Assessment Progress Note   Per Sheran Fava, NP , this pt requires psychiatric hospitalization at this time.  [place], RN, [facility] has not yet assigned pt to a bed, but has asked this Probation officer to have pt sign consents in anticipation of bed availability.  Pt has signed Voluntary Admission and Consent for Treatment, as well as Consent to Release Information to the [facility] [place] ACT Team and to her daughter, and a notification call has been placed to [facility]  Signed forms have been faxed to [facility].  EDP Dene Gentry, MD and pt's nurse, Cleotis Lema, have been notified, and Cleotis Lema agrees to send original paperwork along with pt via [facility], and to call report to [phone] when the time comes.  Jalene Mullet, [place] [facility] Coordinator [phone]  Addendum:  Dietrich Pates has assigned pt to [name] 504-2 to the service of Dr Caswell Corwin.  [facility] will be ready to receive pt at 14:00.  Thes details have been reported to pertinent parties noted above.  Jalene Mullet, [place] Coordinator [phone]

## 2021-08-11 NOTE — Tx Team (Signed)
Initial Treatment Plan 08/11/2021 4:00 PM Kearia Yin [id number]    PATIENT STRESSORS: Financial difficulties   Medication change or noncompliance     PATIENT STRENGTHS: Ability for insight  Average or above average intelligence  Capable of independent living  Supportive family/friends    PATIENT IDENTIFIED PROBLEMS: "Stable housing"  "Positive interaction with family"  Paranoia  Depression  Medication noncompliance             DISCHARGE CRITERIA:  Ability to meet basic life and health needs Adequate post-discharge living arrangements Motivation to continue treatment in a less acute level of care  PRELIMINARY DISCHARGE PLAN: Attend aftercare/continuing care group Outpatient therapy Return to previous living arrangement  PATIENT/FAMILY INVOLVEMENT: This treatment plan has been presented to and reviewed with the patient, [name], and/or family member.  The patient and family have been given the opportunity to ask questions and make suggestions.  Coralyn Mark Lucienne Sawyers, RN 08/11/2021, 4:00 PM

## 2021-08-11 NOTE — Progress Notes (Signed)
Pt has been sleep much of the evening    08/11/21 2300  Psych Admission Type (Psych Patients Only)  Admission Status Voluntary  Psychosocial Assessment  Patient Complaints Anxiety  Eye Contact Brief  Facial Expression Anxious  Affect Appropriate to circumstance  Speech Logical/coherent  Interaction Assertive  Motor Activity Slow  Appearance/Hygiene In scrubs  Behavior Characteristics Cooperative  Mood Pleasant  Thought Process  Coherency WDL  Content Paranoia  Delusions Paranoid  Perception WDL  Hallucination None reported or observed  Judgment Impaired  Confusion None  Danger to Self  Current suicidal ideation? Denies  Danger to Others  Danger to Others None reported or observed

## 2021-08-11 NOTE — ED Notes (Signed)
[facility] called back and stated they would let a driver know and be out to pick up patient shortly.

## 2021-08-11 NOTE — ED Notes (Signed)
Pt given burgundy scrubs and instructed to change into them and to put her belongings in appropriate bags. Pt agreeable.

## 2021-08-12 ENCOUNTER — Encounter (HOSPITAL_COMMUNITY): Payer: Self-pay

## 2021-08-12 DIAGNOSIS — F250 Schizoaffective disorder, bipolar type: Principal | ICD-10-CM

## 2021-08-12 LAB — LIPID PANEL
Cholesterol: 152 mg/dL (ref 0–200)
HDL: 41 mg/dL (ref >40)
LDL Cholesterol: 95 mg/dL (ref 0–99)
Total CHOL/HDL Ratio: 3.7 RATIO
Triglycerides: 81 mg/dL (ref <150)
VLDL: 16 mg/dL (ref 0–40)

## 2021-08-12 LAB — HEMOGLOBIN A1C
Hgb A1c MFr Bld: 5.8 % — ABNORMAL HIGH (ref 4.8–5.6)
Mean Plasma Glucose: 119.76 mg/dL

## 2021-08-12 LAB — TSH: TSH: 1.276 u[IU]/mL (ref 0.350–4.500)

## 2021-08-12 MED ORDER — METFORMIN HCL 500 MG PO TABS
500.0000 mg | ORAL_TABLET | Freq: Every day | ORAL | Status: DC
  Administered 2021-08-13 – 2021-08-25 (×13): 500 mg via ORAL
  Filled 2021-08-12 (×15): qty 1

## 2021-08-12 MED ORDER — OLANZAPINE 5 MG PO TABS
5.0000 mg | ORAL_TABLET | Freq: Every day | ORAL | Status: DC
  Administered 2021-08-12: 5 mg via ORAL
  Filled 2021-08-12 (×4): qty 1

## 2021-08-12 MED ORDER — DIVALPROEX SODIUM ER 500 MG PO TB24
1000.0000 mg | ORAL_TABLET | Freq: Every day | ORAL | Status: DC
  Administered 2021-08-12 – 2021-08-24 (×13): 1000 mg via ORAL
  Filled 2021-08-12 (×15): qty 2

## 2021-08-12 NOTE — BHH Group Notes (Signed)
Pt didn't attend group. 
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
## 2021-08-12 NOTE — Group Note (Signed)
Date:  08/12/2021 Time:  10:01 AM  Group Topic/Focus:  Goals Group:   The focus of this group is to help patients establish daily goals to achieve during treatment and discuss how the patient can incorporate goal setting into their daily lives to aide in recovery. Orientation:   The focus of this group is to educate the patient on the purpose and policies of crisis stabilization and provide a format to answer questions about their admission.  The group details unit policies and expectations of patients while admitted.    Participation Level:  Active  Participation Quality:  Appropriate  Affect:  Appropriate  Cognitive:  Appropriate  Insight: Appropriate  Engagement in Group:  Engaged  Modes of Intervention:  Discussion  Additional Comments:  Pt has a goal of staying focused and learning some skills to help her concentrate better.    [name] 08/12/2021, 10:01 AM

## 2021-08-12 NOTE — Progress Notes (Signed)
°   08/12/21 2050  Psych Admission Type (Psych Patients Only)  Admission Status Voluntary  Psychosocial Assessment  Patient Complaints None  Eye Contact Brief  Facial Expression Anxious  Affect Anxious  Speech Logical/coherent;Pressured  Interaction Assertive  Motor Activity Slow  Appearance/Hygiene In scrubs;Disheveled  Behavior Characteristics Cooperative;Anxious  Mood Anxious  Thought Process  Coherency Circumstantial;Tangential  Content Preoccupation;Delusions  Delusions Paranoid  Perception WDL  Hallucination None reported or observed  Judgment Impaired  Confusion Mild  Danger to Self  Current suicidal ideation? Denies  Danger to Others  Danger to Others None reported or observed    Pt seen at med window. Pt denies SI, HI, AVH and pain. Pt endorsed pain but then said she didn't have any. Pt denies anxiety and depression. Pt pressured speech and anxious affect.
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
## 2021-08-12 NOTE — BH IP Treatment Plan (Signed)
Interdisciplinary Treatment and Diagnostic Plan Update  08/12/2021 [name] MRN: [id number]  Principal Diagnosis: <principal problem not specified>  Secondary Diagnoses: Active Problems:   Schizophrenia ([place])   Current Medications:  Current Facility-Administered Medications  Medication Dose Route Frequency Provider Last Rate Last Admin   ascorbic acid (VITAMIN C) tablet 1,000 mg  1,000 mg Oral Daily Suella Broad, FNP   1,000 mg at 08/12/21 0842   atorvastatin (LIPITOR) tablet 10 mg  10 mg Oral Daily Suella Broad, FNP   10 mg at 08/12/21 8675   hydrOXYzine (ATARAX) tablet 25 mg  25 mg Oral TID Suella Broad, FNP   25 mg at 08/12/21 1256   ibuprofen (ADVIL) tablet 400 mg  400 mg Oral Q6H PRN Suella Broad, FNP   400 mg at 08/12/21 0154   magnesium hydroxide (MILK OF MAGNESIA) suspension 30 mL  30 mL Oral Daily PRN Starkes-Perry, Gayland Curry, FNP       mometasone-formoterol (DULERA) 200-5 MCG/ACT inhaler 2 puff  2 puff Inhalation BID Suella Broad, FNP   2 puff at 08/12/21 0842   traZODone (DESYREL) tablet 100 mg  100 mg Oral QHS PRN Suella Broad, FNP   100 mg at 08/12/21 0154   PTA Medications: Medications Prior to Admission  Medication Sig Dispense Refill Last Dose   Ascorbic Acid (VITAMIN C WITH ROSE HIPS) 500 MG tablet Take 1,000 mg by mouth daily.      atorvastatin (LIPITOR) 10 MG tablet Take 10 mg by mouth daily.      budesonide-formoterol (SYMBICORT) 160-4.5 MCG/ACT inhaler Inhale 2 puffs into the lungs 2 (two) times daily. For Shortness of breath 1 Inhaler 0    hydrOXYzine (VISTARIL) 25 MG capsule Take 25 mg by mouth 3 (three) times daily.      ibuprofen (ADVIL) 200 MG tablet Take 400 mg by mouth every 6 (six) hours as needed for moderate pain or headache.      paliperidone (INVEGA SUSTENNA) 234 MG/1.5ML SUSY injection Inject 234 mg into the muscle once for 1 dose. (Patient taking differently: Inject 234 mg into the muscle  every 21 ( twenty-one) days.) 1.5 mL 1    Pseudoephedrine-APAP-DM (DAYQUIL PO) Take 1 tablet by mouth at bedtime.      traZODone (DESYREL) 100 MG tablet Take 100 mg by mouth at bedtime as needed for sleep.       Patient Stressors: Financial difficulties   Medication change or noncompliance    Patient Strengths: Ability for insight  Average or above average intelligence  Capable of independent living  Supportive family/friends   Treatment Modalities: Medication Management, Group therapy, Case management,  1 to 1 session with clinician, Psychoeducation, Recreational therapy.   Physician Treatment Plan for Primary Diagnosis: <principal problem not specified> Long Term Goal(s):     Short Term Goals:    Medication Management: Evaluate patient's response, side effects, and tolerance of medication regimen.  Therapeutic Interventions: 1 to 1 sessions, Unit Group sessions and Medication administration.  Evaluation of Outcomes: Not Met  Physician Treatment Plan for Secondary Diagnosis: Active Problems:   Schizophrenia ([place])  Long Term Goal(s):     Short Term Goals:       Medication Management: Evaluate patient's response, side effects, and tolerance of medication regimen.  Therapeutic Interventions: 1 to 1 sessions, Unit Group sessions and Medication administration.  Evaluation of Outcomes: Not Met   RN Treatment Plan for Primary Diagnosis: <principal problem not specified> Long Term Goal(s): Knowledge of disease  and therapeutic regimen to maintain health will improve  Short Term Goals: Ability to participate in decision making will improve, Ability to verbalize feelings will improve, and Compliance with prescribed medications will improve  Medication Management: RN will administer medications as ordered by provider, will assess and evaluate patient's response and provide education to patient for prescribed medication. RN will report any adverse and/or side effects to prescribing  provider.  Therapeutic Interventions: 1 on 1 counseling sessions, Psychoeducation, Medication administration, Evaluate responses to treatment, Monitor vital signs and CBGs as ordered, Perform/monitor CIWA, COWS, AIMS and Fall Risk screenings as ordered, Perform wound care treatments as ordered.  Evaluation of Outcomes: Not Met   LCSW Treatment Plan for Primary Diagnosis: <principal problem not specified> Long Term Goal(s): Safe transition to appropriate next level of care at discharge, Engage patient in therapeutic group addressing interpersonal concerns.  Short Term Goals: Engage patient in aftercare planning with referrals and resources, Increase social support, and Increase ability to appropriately verbalize feelings  Therapeutic Interventions: Assess for all discharge needs, 1 to 1 time with Social worker, Explore available resources and support systems, Assess for adequacy in community support network, Educate family and significant other(s) on suicide prevention, Complete Psychosocial Assessment, Interpersonal group therapy.  Evaluation of Outcomes: Not Met   Progress in Treatment: Attending groups: Yes. Participating in groups: Yes. Taking medication as prescribed: Yes. Toleration medication: Yes. Family/Significant other contact made: No, will contact:  daughter and uncle Patient understands diagnosis: Yes. Discussing patient identified problems/goals with staff: Yes. Medical problems stabilized or resolved: Yes. Denies suicidal/homicidal ideation: Yes. Issues/concerns per patient self-inventory: No. Other: None  New problem(s) identified: No, Describe:  None  New Short Term/Long Term Goal(s):medication stabilization, elimination of SI thoughts, development of comprehensive mental wellness plan.   Patient Goals:  "going to [facility]"  Discharge Plan or Barriers: Patient recently admitted. CSW will continue to follow and assess for appropriate referrals and possible  discharge planning.   Reason for Continuation of Hospitalization: Delusions  Mania Medication stabilization  Estimated Length of Stay: 3-5 days   Scribe for Treatment Team: Eliott Nine 08/12/2021 3:24 PM

## 2021-08-12 NOTE — Progress Notes (Signed)
°   08/12/21 0500  Sleep  Number of Hours 9.5

## 2021-08-12 NOTE — Group Note (Signed)
LCSW Group Therapy Note   Group Date: 08/12/2021 Start Time: 1100 End Time: 1200   Type of Therapy and Topic:  Group Therapy: Challenging Core Beliefs  Participation Level:  Active  Description of Group:  Patients were educated about core beliefs and asked to identify one harmful core belief that they have. Patients were asked to explore from where those beliefs originate. Patients were asked to discuss how those beliefs make them feel and the resulting behaviors of those beliefs. They were then be asked if those beliefs are true and, if so, what evidence they have to support them. Lastly, group members were challenged to replace those negative core beliefs with helpful beliefs.   Therapeutic Goals:   1. Patient will identify harmful core beliefs and explore the origins of such beliefs. 2. Patient will identify feelings and behaviors that result from those core beliefs. 3. Patient will discuss whether such beliefs are true. 4.  Patient will replace harmful core beliefs with helpful ones.  Summary of Patient Progress:  [name] actively engaged in processing and exploring how core beliefs are formed and how they impact thoughts, feelings, and behaviors. Patient proved open to input from peers and feedback from [name]. Patient demonstrated developing insight into the subject matter, was respectful and supportive of peers, and participated throughout the entire session.  Therapeutic Modalities: Cognitive Behavioral Therapy; [place] A Gianelle Mccaul, LCSWA 08/12/2021  11:44 AM

## 2021-08-13 MED ORDER — SIMETHICONE 80 MG PO CHEW: 80.0000 mg | CHEWABLE_TABLET | Freq: Four times a day (QID) | ORAL | Status: DC | PRN

## 2021-08-13 MED ORDER — OLANZAPINE 10 MG PO TABS
10.0000 mg | ORAL_TABLET | Freq: Every day | ORAL | Status: DC
  Administered 2021-08-13 – 2021-08-14 (×2): 10 mg via ORAL
  Filled 2021-08-13 (×4): qty 1

## 2021-08-13 MED ORDER — DOCUSATE SODIUM 100 MG PO CAPS
100.0000 mg | ORAL_CAPSULE | Freq: Every day | ORAL | Status: DC
  Administered 2021-08-13 – 2021-08-25 (×13): 100 mg via ORAL
  Filled 2021-08-13 (×17): qty 1

## 2021-08-13 NOTE — Progress Notes (Signed)
[facility] MD Progress Note  08/13/2021 9:36 AM [name]  MRN:  [id number] Subjective:  [name] is a 50 year old female with a psychiatric history of schizoaffective disorder-bipolar type and a medical history of asthma and dyslipidemia who presented voluntarily and admitted for disorganized thoughts and paranoia.  Chart Review of Past 24 hrs: The patient's chart was reviewed and nursing notes were reviewed. The patient's case was discussed in multidisciplinary team meeting.  Per MAR: - Patient is compliant with scheduled meds. - PRNs: Advil 400 mg x 1, Trazodone 100 mg x 1. Per RN notes, no documented behavioral issues and is attending group. Patient slept 7.5 hours  Patient had the following psychiatric recommendations yesterday:  - Initiate Depakote ER 1000 mg nightly for mood stabilization; patient reports discontinuing Depakote 2/2 weight gain, but may have decompensated without it. - Initiate Zyprexa 5 mg nightly for psychotic symptoms.  If patient missed [place] injection, will initiate Invega 6 mg daily and administer LAI.  If patient has received last LAI, we will begin work-up for Clozaril, as patient will have failed the Invega.  On Today's Assessment (08/13/2021): Case was discussed in the multidisciplinary team. MAR was reviewed and patient was compliant with medications. Patient seen, assessed, and discussed with attending Dr. Berdine Addison.  She reports that she is still worried today about people trying to sexually assault her and others around her. Although, she reports that she slept well last night and appetite is good.  She reports no SI, HI, AVH, first rank symptoms, or ideas of reference. She continues to endorse paranoid ideation and delusions. She has concerns about HIV coming from the ED or transfusion. This writer advised that her concerns would be eased with testing, and she was appreciative. She voiced no further concerns.   Principal Problem: Schizoaffective disorder,  bipolar type ([place]) Diagnosis: Principal Problem:   Schizoaffective disorder, bipolar type ([place]) Active Problems:   Prediabetes   Essential hypertension  Total Time spent with patient: 30 minutes  Past Psychiatric History: See H&P  Past Medical History:  Past Medical History:  Diagnosis Date   Anxiety    Asthma    Bipolar 1 disorder ([place])    Depression    Gallstones 08/2020   Hypertension    Insomnia, persistent    Prediabetes    Schizophrenic disorder ([place])    Seizures ([place])     Past Surgical History:  Procedure Laterality Date   BREAST LUMPECTOMY Left 07/2013   TONSILLECTOMY     TUBAL LIGATION  2002   Family History:  Family History  Problem Relation Age of Onset   Depression Mother    Gout Mother    Cancer Father        prostate   Other Father        lung issue   Alcoholism Other    Heart attack Paternal Grandfather    Heart attack Paternal Grandmother    Heart attack Maternal Grandmother    Heart attack Maternal Grandfather    Depression Son    Anxiety disorder Son    Family Psychiatric  History: See H&P Social History:  Social History   Substance and Sexual Activity  Alcohol Use Not Currently     Social History   Substance and Sexual Activity  Drug Use No    Social History   Socioeconomic History   Marital status: Single    Spouse name: Not on file   Number of children: Not on file   Years of education:  Not on file   Highest education level: Not on file  Occupational History   Not on file  Tobacco Use   Smoking status: Former    Types: Cigarettes   Smokeless tobacco: Never  Vaping Use   Vaping Use: Never used  Substance and Sexual Activity   Alcohol use: Not Currently   Drug use: No   Sexual activity: Not Currently  Other Topics Concern   Not on file  Social History Narrative   Not on file   Social Determinants of Health   Financial Resource Strain: Not on file  Food Insecurity: Food Insecurity Present   Worried About  [place] in the Last Year: Sometimes true   Ran Out of Food in the Last Year: Sometimes true  Transportation Needs: No Transportation Needs   Lack of Transportation (Medical): No   Lack of Transportation (Non-Medical): No  Physical Activity: Not on file  Stress: Not on file  Social Connections: Not on file   Additional Social History:         Sleep: Good  Appetite:  Good  Current Medications: Current Facility-Administered Medications  Medication Dose Route Frequency Provider Last Rate Last Admin   ascorbic acid (VITAMIN C) tablet 1,000 mg  1,000 mg Oral Daily Suella [place], FNP   1,000 mg at 08/12/21 0842   atorvastatin (LIPITOR) tablet 10 mg  10 mg Oral Daily Suella Broad, FNP   10 mg at 08/12/21 3299   divalproex (DEPAKOTE ER) 24 hr tablet 1,000 mg  1,000 mg Oral QHS Rosezetta Schlatter, MD   1,000 mg at 08/12/21 2053   hydrOXYzine (ATARAX) tablet 25 mg  25 mg Oral TID Suella Broad, FNP   25 mg at 08/12/21 1720   ibuprofen (ADVIL) tablet 400 mg  400 mg Oral Q6H PRN Suella Broad, FNP   400 mg at 08/13/21 0533   magnesium hydroxide (MILK OF MAGNESIA) suspension 30 mL  30 mL Oral Daily PRN Starkes-Perry, Gayland Curry, FNP       metFORMIN (GLUCOPHAGE) tablet 500 mg  500 mg Oral Q breakfast Rosezetta Schlatter, MD       mometasone-formoterol (DULERA) 200-5 MCG/ACT inhaler 2 puff  2 puff Inhalation BID Suella Broad, FNP   2 puff at 08/12/21 2053   OLANZapine (ZYPREXA) tablet 5 mg  5 mg Oral QHS Rosezetta Schlatter, MD   5 mg at 08/12/21 2053   traZODone (DESYREL) tablet 100 mg  100 mg Oral QHS PRN Suella Broad, FNP   100 mg at 08/12/21 2053    Lab Results:  Results for orders placed or performed during the hospital encounter of 08/11/21 (from the past 48 hour(s))  Hemoglobin A1c     Status: Abnormal   Collection Time: 08/12/21  6:26 AM  Result Value Ref Range   Hgb A1c MFr Bld 5.8 (H) 4.8 - 5.6 %    Comment: (NOTE) Pre diabetes:           5.7%-6.4%  Diabetes:              >6.4%  Glycemic control for   <7.0% adults with diabetes    Mean Plasma Glucose 119.76 mg/dL    Comment: Performed at [facility], [place] [address] [place], [place] [address]  TSH     Status: None   Collection Time: 08/12/21  6:26 AM  Result Value Ref Range   TSH 1.276 0.350 - 4.500 uIU/mL    Comment: Performed  by a 3rd Generation assay with a functional sensitivity of <=0.01 uIU/mL. Performed at [facility], [place] [address], [place], [place] [address]   Lipid panel     Status: None   Collection Time: 08/12/21  6:26 AM  Result Value Ref Range   Cholesterol 152 0 - 200 mg/dL   Triglycerides 81 <150 mg/dL   HDL 41 >40 mg/dL   Total CHOL/HDL Ratio 3.7 RATIO   VLDL 16 0 - 40 mg/dL   LDL Cholesterol 95 0 - 99 mg/dL    Comment:        Total Cholesterol/HDL:CHD Risk Coronary Heart Disease Risk Table                     Men   Women  1/2 Average Risk   3.4   3.3  Average Risk       5.0   4.4  2 X Average Risk   9.6   7.1  3 X Average Risk  23.4   11.0        Use the calculated Patient Ratio above and the CHD Risk Table to determine the patient's CHD Risk.        ATP III CLASSIFICATION (LDL):  <100     mg/dL   Optimal  100-129  mg/dL   Near or Above                    Optimal  130-159  mg/dL   Borderline  160-189  mg/dL   High  >190     mg/dL   Very High Performed at [facility] [address], [place], [place] [address]     Blood Alcohol level:  Lab Results  Component Value Date   [facility] <10 08/11/2021   ETH <10 20/35/5974    Metabolic Disorder Labs: Lab Results  Component Value Date   HGBA1C 5.8 (H) 08/12/2021   MPG 119.76 08/12/2021   MPG 125.5 04/27/2021   Lab Results  Component Value Date   PROLACTIN 74.6 (H) 09/28/2019   PROLACTIN 4.2 (L) 12/19/2018   Lab Results  Component Value Date   CHOL 152 08/12/2021   TRIG 81 08/12/2021   HDL 41 08/12/2021   CHOLHDL 3.7  08/12/2021   VLDL 16 08/12/2021   LDLCALC 95 08/12/2021   LDLCALC 125 (H) 04/27/2021    Physical Findings: AIMS: Facial and Oral Movements Muscles of Facial Expression: None, normal Lips and Perioral Area: None, normal Jaw: None, normal Tongue: None, normal,Extremity Movements Upper (arms, wrists, hands, fingers): None, normal Lower (legs, knees, ankles, toes): None, normal, Trunk Movements Neck, shoulders, hips: None, normal, Overall Severity Severity of abnormal movements (highest score from questions above): None, normal Incapacitation due to abnormal movements: None, normal [name] awareness of abnormal movements (rate only patient's report): No Awareness, Dental Status Current problems with teeth and/or dentures?: No Does patient usually wear dentures?: No    Musculoskeletal: Strength & Muscle Tone: within normal limits Gait & Station: normal Patient leans: N/A  Psychiatric Specialty Exam:  Presentation  General Appearance: Appropriate for Environment; Casual  Eye Contact:Good  Speech:Clear and Coherent; Pressured (Verbose)  Speech Volume:Normal  Handedness:Right   Mood and Affect  Mood:Anxious  Affect:Congruent (Anxious) And suspicious  Thought Process  Thought Processes:Coherent; Goal Directed  Descriptions of Associations:Circumstantial (sometimes circumstantial, sometimes tangential)  Orientation:Full (Time, Place and Person)  Thought Content:Delusions; Paranoid Ideation; Rumination  History of Schizophrenia/Schizoaffective disorder:Yes  Duration of Psychotic Symptoms:Greater than  six months  Hallucinations:Hallucinations: None  Ideas of Reference:Percusatory; Paranoia; Delusions  Suicidal Thoughts:Suicidal Thoughts: Yes, Active SI Active Intent and/or Plan: Without Intent; Without Plan  Homicidal Thoughts:Homicidal Thoughts: No   Sensorium  Memory:Immediate Good; Recent Good; Remote  Good  Judgment:Intact  Insight:Fair   Executive Functions  Concentration:Fair  Attention Span:Fair  Recall:Good  Fund of Knowledge:Good  Language:Good   Psychomotor Activity  Psychomotor Activity:Psychomotor Activity: Normal   Assets  Assets:Communication Skills; Desire for Improvement; Housing; Resilience; Financial Resources/Insurance; Social Support   Sleep  Sleep:Sleep: -- (Hypersomnia)    Physical Exam: Physical Exam Vitals and nursing note reviewed.  Constitutional:      Appearance: Normal appearance.  HENT:     Head: Normocephalic and atraumatic.     Mouth/Throat:     Mouth: Mucous membranes are moist.     Pharynx: Oropharynx is clear.  Pulmonary:     Effort: Pulmonary effort is normal.  Skin:    General: Skin is warm and dry.  Neurological:     General: No focal deficit present.     Mental Status: She is alert and oriented to person, place, and time.     Motor: No weakness.   Review of Systems  Respiratory:  Negative for shortness of breath.   Cardiovascular:  Negative for chest pain.  Gastrointestinal:  Positive for constipation. Negative for blood in stool, diarrhea, nausea and vomiting.  Genitourinary: Negative.   Musculoskeletal: Negative.   Neurological:  Negative for weakness and headaches.  Blood pressure (!) 144/89, pulse 94, temperature 97.6 F (36.4 C), temperature source Oral, resp. rate 18, height 5\' 8"  (1.727 m), weight 117.5 kg, SpO2 100 %. Body mass index is 39.38 kg/m.   ASSESSMENT: Principal Problem:   Schizoaffective disorder, bipolar type ([place]) Active Problems:   Prediabetes   Essential hypertension  Treatment Plan Summary: Daily contact with patient to assess and evaluate symptoms and progress in treatment and Medication management     Safety and Monitoring: voluntarily admission to inpatient psychiatric unit for safety, stabilization and treatment Daily contact with patient to assess and evaluate symptoms and  progress in treatment [name] case to be discussed in multi-disciplinary team meeting Observation Level : q15 minute checks Vital signs: q12 hours Precautions: suicide, elopement, and assault   2.  Psychiatric diagnosis #Schizoaffective disorder-bipolar type #PTSD Patient reports medication compliance, but unsure when patient received received last [place] injection.  Per documentation, patient received injection on 11/17.  She reports receiving next injection 12/6, but documentation shows that was stated discharged without receiving an injection.  We will reach out to [facility] on Monday to confirm; attempts were made today were futile. - Continue Depakote ER 1000 mg nightly for mood stabilization (started 12/16; VPA level to be checked 12/19); patient reports discontinuing Depakote 2/2 weight gain, but may have decompensated without it. - Increase to Zyprexa 10 mg nightly for psychotic symptoms.  If patient missed [place] injection, will initiate Invega 6 mg daily and administer LAI.  If patient has received last LAI, we will begin work-up for Clozaril, as patient will have failed the Invega.   3. Medical Management Covid negative CMP: Unremarkable CBC: Hgb 10.9 EtOH: <10 UDS: Negative TSH: 1.276 A1C: 5.8% Lipids: Unremarkable    #Prediabetes #History of dyslipidemia - Initiate metformin 500 mg once daily for prediabetes and prevention of antipsychotic induced metabolic syndrome - Continue home atorvastatin 10 mg daily  #Asthma - Continue home Dulera 2 puffs twice daily  Discharge Planning:              --  Social work and case management to assist with discharge planning and identification of hospital follow-up needs prior to discharge.              -- Estimated LOS: 5-7 days             -- Discharge Concerns: Need to establish a safety plan; Medication compliance and effectiveness             -- Discharge Goals: Return home with outpatient referrals for mental  health follow-up including medication management/psychotherapy   Rosezetta Schlatter, MD 08/13/2021, 9:36 AM

## 2021-08-13 NOTE — Progress Notes (Signed)
Pt presents with fair eye contact, ambulatory in milieu with slow but steady gait and is tangential and intrusive on interactions with both peers and staff. Pt continues to be sexually preoccupied, talking to peers about her sexuality "I am not a transgender because I had a boyfriend. I am a woman and I'm straight". However, pt remains verbally is redirectable. Reports she slept well last night with good appetite, hyperactive mood and good concentration level. Cooperative with care, attends and engaged in unit activities including scheduled groups. Denies SI, HI, AVH and pain when assessed.  Emotional support and reassurance provided to pt this shift. Safety checks maintained this shift without self harm gestures or outburst. Scheduled medications administered with verbal education and effects monitored. Pt encouraged to voice concerns.  Pt tolerates all meals, medications and fluids well. Denies concerns at this time.

## 2021-08-13 NOTE — Group Note (Signed)
°  [facility] LCSW Group Therapy Note  Date/Time:  08/13/2021 11:15AM-12:00PM  Type of Therapy and Topic:  Group Therapy:  Feelings About Hospitalization  Participation Level:  Active   Description of Group This process group involved patients discussing their feelings related to being hospitalized, as well as the benefits they see to being in the hospital.  These feelings and benefits were itemized.  The group then brainstormed specific ways in which they could seek those same benefits when they discharge and return home.  Therapeutic Goals Patient will identify and describe positive and negative feelings related to hospitalization Patient will verbalize benefits of hospitalization to themselves personally Patients will brainstorm together ways they can obtain similar benefits in the outpatient setting, identify barriers to wellness and possible solutions  Summary of Patient Progress:  The patient expressed her primary feelings about being hospitalized are "not upset about it."  She was disorganized but focused on her discharge plan to "[place]" or her apartment.  She is concerned that her bills have not been paid in her current apartment, but did not discuss who was supposed to pay them.  She was pleasant and less intrusive than she is known to be during other hospitalizations.  Therapeutic Modalities Cognitive Behavioral Therapy Motivational Interviewing    Selmer Dominion, LCSW 08/13/2021, 3:35 PM

## 2021-08-13 NOTE — Clinical Note (Incomplete)
Goals Group 08/13/21    Group Focus: affirmation, clarity of thought, and goals/reality orientation Treatment Modality:  Psychoeducation Interventions utilized were assignment, group exercise, and support Purpose: To be able to understand and verbalize the reason for their admission to the hospital. To understand that the medication helps with their chemical imbalance but they also need to work on their choices in life. To be challenged to develop a list of 30 positives about themselves. Also introduce the concept that "feelings" are not reality.  Participation Level:  Active  Participation Quality:  Appropriate  Affect:  flat  Cognitive:  Appropriate  Insight:  has some insight into her illness  Engagement in Group:  Engaged  Additional Comments: Pt unable to rate her energy level.Marveen Reeks, Elnora Morrison

## 2021-08-13 NOTE — Progress Notes (Addendum)
°   08/13/21 2045  Psych Admission Type (Psych Patients Only)  Admission Status Voluntary  Psychosocial Assessment  Patient Complaints Other (Comment) (headache pain)  Eye Contact Brief  Facial Expression Anxious  Affect Anxious  Speech Logical/coherent;Pressured  Interaction Assertive  Motor Activity Slow  Appearance/Hygiene Unremarkable  Behavior Characteristics Cooperative;Anxious  Mood Anxious;Preoccupied  Thought Process  Coherency Circumstantial;Tangential  Content Preoccupation;Delusions  Delusions Paranoid;Religious  Perception WDL  Hallucination None reported or observed  Judgment Impaired  Confusion Mild  Danger to Self  Current suicidal ideation? Denies  Danger to Others  Danger to Others None reported or observed   Pt denies SI, HI, AVH. Rates pain 5/10 as a headache in her forehead. Rates anxiety 5/10 and denies depression. "I was sleep today so I don't think I was depressed." Pt religiously and sexually preoccupied. Watching a movie in the dayroom and pt talking about how character ([place]) shouldn't dress that way and that certain people are gay and that they shouldn't dress that way. Then talked about Jehovah's Witness and women preaching in the church. Jumped from topic to topic. Pt wanted to sign 72 hour request for discharge form. Form explained to patient. Pt had no questions. Form signed and witnessed at 2125.

## 2021-08-13 NOTE — [facility] Group Notes (Signed)
Adult Psychoeducational Group Note  Date:  08/13/2021 Time:  9:30 PM  Group Topic/Focus:  Wrap-Up Group:   The focus of this group is to help patients review their daily goal of treatment and discuss progress on daily workbooks.  Participation Level:  Active  Participation Quality:  Intrusive  Affect:  Appropriate  Cognitive:  Alert  Insight: Appropriate  Engagement in Group:  Distracting and Off Topic  Modes of Intervention:  Discussion and Education  Additional Comments:  Pt attended and participated in wrap up group this evening and rated their day a 5/10. Pt stated that they would like to see a chaplin. Pt had an "average" day, so they have no complaints. Tomorrow pt would like to be stabilized and to work on discharge planning.   Cristi Loron 08/13/2021, 9:30 PM

## 2021-08-14 LAB — HIV ANTIBODY (ROUTINE TESTING W REFLEX): HIV Screen 4th Generation wRfx: NONREACTIVE

## 2021-08-14 NOTE — [facility] Group Notes (Signed)
Adult Psychoeducational Group Not Date:  08/14/2021 Time:  0900-1045 Group Topic/Focus: PROGRESSIVE RELAXATION. A group where deep breathing is taught and tensing and relaxation muscle groups is used. Imagery is used as well.  Pts are asked to imagine 3 pillars that hold them up when they are not able to hold themselves up.  Participation Level:  Did not attend [name]

## 2021-08-14 NOTE — Progress Notes (Signed)
[facility] MD Progress Note  08/14/2021 4:45 PM [name]  MRN:  [id number] Subjective:  [name] is a 50 year old female with a psychiatric history of schizoaffective disorder-bipolar type and a medical history of asthma and dyslipidemia who presented voluntarily and admitted for disorganized thoughts and paranoia.  Chart Review of Past 24 hrs: The patient's chart was reviewed and nursing notes were reviewed. The patient's case was discussed in multidisciplinary team meeting.  Per MAR: - Patient is compliant with scheduled meds. - PRNs: Advil 400 mg x 1, Trazodone 100 mg x 1. Per RN notes, no documented behavioral issues and is attending group. Patient slept 6.75 hours  Patient had the following psychiatric recommendations yesterday:  - Depakote ER 1000 mg nightly for mood stabilization; patient reports discontinuing Depakote 2/2 weight gain, but may have decompensated without it. - Increased Zyprexa 10 mg nightly for psychotic symptoms.  If patient missed [place] injection, will initiate Invega 6 mg daily and administer LAI.  If patient has received last LAI, we will begin work-up for Clozaril, as patient will have failed the Invega.  On Today's Assessment (08/14/2021): Case was discussed in the multidisciplinary team. MAR was reviewed and patient was compliant with medications.   She reports she is feeling "alright" but is a bit irritable today. She slept "some" overnight. She is still concerned that she is going to be confused for a transgender person. Today she elaborates that she had a neighbor who ws transgender, and that person follows her and other people are after her, and get them confused. She is angry about this, but does not wish any of the involved parties harm.   Principal Problem: Schizoaffective disorder, bipolar type ([place]) Diagnosis: Principal Problem:   Schizoaffective disorder, bipolar type ([place]) Active Problems:   Prediabetes   Essential hypertension  Total Time  spent with patient: 30 minutes  Past Psychiatric History: See H&P  Past Medical History:  Past Medical History:  Diagnosis Date   Anxiety    Asthma    Bipolar 1 disorder ([place])    Depression    Gallstones 08/2020   Hypertension    Insomnia, persistent    Prediabetes    Schizophrenic disorder ([place])    Seizures ([place])     Past Surgical History:  Procedure Laterality Date   BREAST LUMPECTOMY Left 07/2013   TONSILLECTOMY     TUBAL LIGATION  2002   Family History:  Family History  Problem Relation Age of Onset   Depression Mother    Gout Mother    Cancer Father        prostate   Other Father        lung issue   Alcoholism Other    Heart attack Paternal Grandfather    Heart attack Paternal Grandmother    Heart attack Maternal Grandmother    Heart attack Maternal Grandfather    Depression Son    Anxiety disorder Son    Family Psychiatric  History: See H&P Social History:  Social History   Substance and Sexual Activity  Alcohol Use Not Currently     Social History   Substance and Sexual Activity  Drug Use No    Social History   Socioeconomic History   Marital status: Single    Spouse name: Not on file   Number of children: Not on file   Years of education: Not on file   Highest education level: Not on file  Occupational History   Not on file  Tobacco Use  Smoking status: Former    Types: Cigarettes   Smokeless tobacco: Never  Vaping Use   Vaping Use: Never used  Substance and Sexual Activity   Alcohol use: Not Currently   Drug use: No   Sexual activity: Not Currently  Other Topics Concern   Not on file  Social History Narrative   Not on file   Social Determinants of Health   Financial Resource Strain: Not on file  Food Insecurity: Food Insecurity Present   Worried About [place] in the Last Year: Sometimes true   [place]  in the Last Year: Sometimes true  Transportation Needs: No Transportation Needs   Lack of  Transportation (Medical): No   Lack of Transportation (Non-Medical): No  Physical Activity: Not on file  Stress: Not on file  Social Connections: Not on file   Additional Social History:         Sleep: Good  Appetite:  Good  Current Medications: Current Facility-Administered Medications  Medication Dose Route Frequency Provider Last Rate Last Admin   ascorbic acid (VITAMIN C) tablet 1,000 mg  1,000 mg Oral Daily Suella Broad, FNP   1,000 mg at 08/14/21 0831   atorvastatin (LIPITOR) tablet 10 mg  10 mg Oral Daily Suella Broad, FNP   10 mg at 08/14/21 4259   divalproex (DEPAKOTE ER) 24 hr tablet 1,000 mg  1,000 mg Oral QHS Rosezetta Schlatter, MD   1,000 mg at 08/13/21 2045   docusate sodium (COLACE) capsule 100 mg  100 mg Oral Daily Rosezetta Schlatter, MD   100 mg at 08/14/21 0831   hydrOXYzine (ATARAX) tablet 25 mg  25 mg Oral TID Suella Broad, FNP   25 mg at 08/14/21 1636   ibuprofen (ADVIL) tablet 400 mg  400 mg Oral Q6H PRN Suella Broad, FNP   400 mg at 08/14/21 5638   magnesium hydroxide (MILK OF MAGNESIA) suspension 30 mL  30 mL Oral Daily PRN Suella Broad, FNP       metFORMIN (GLUCOPHAGE) tablet 500 mg  500 mg Oral Q breakfast Rosezetta Schlatter, MD   500 mg at 08/14/21 0831   mometasone-formoterol (DULERA) 200-5 MCG/ACT inhaler 2 puff  2 puff Inhalation BID Suella Broad, FNP   2 puff at 08/14/21 0832   OLANZapine (ZYPREXA) tablet 10 mg  10 mg Oral QHS Rosezetta Schlatter, MD   10 mg at 08/13/21 2045   simethicone (MYLICON) chewable tablet 80 mg  80 mg Oral QID PRN Rosezetta Schlatter, MD       traZODone (DESYREL) tablet 100 mg  100 mg Oral QHS PRN Suella Broad, FNP   100 mg at 08/13/21 2045    Lab Results:  Results for orders placed or performed during the hospital encounter of 08/11/21 (from the past 48 hour(s))  HIV Antibody (routine testing w rflx)     Status: None   Collection Time: 08/13/21  6:51 PM  Result Value Ref  Range   HIV Screen 4th Generation wRfx Non Reactive Non Reactive    Comment: Performed at [facility], [place] [address], [place], [place] [address]    Blood Alcohol level:  Lab Results  Component Value Date   [facility] <10 08/11/2021   ETH <10 32/95/1884    Metabolic Disorder Labs: Lab Results  Component Value Date   HGBA1C 5.8 (H) 08/12/2021   MPG 119.76 08/12/2021   MPG 125.5 04/27/2021   Lab Results  Component Value Date  PROLACTIN 74.6 (H) 09/28/2019   PROLACTIN 4.2 (L) 12/19/2018   Lab Results  Component Value Date   CHOL 152 08/12/2021   TRIG 81 08/12/2021   HDL 41 08/12/2021   CHOLHDL 3.7 08/12/2021   VLDL 16 08/12/2021   LDLCALC 95 08/12/2021   LDLCALC 125 (H) 04/27/2021    Physical Findings: AIMS: Facial and Oral Movements Muscles of Facial Expression: None, normal Lips and Perioral Area: None, normal Jaw: None, normal Tongue: None, normal,Extremity Movements Upper (arms, wrists, hands, fingers): None, normal Lower (legs, knees, ankles, toes): None, normal, Trunk Movements Neck, shoulders, hips: None, normal, Overall Severity Severity of abnormal movements (highest score from questions above): None, normal Incapacitation due to abnormal movements: None, normal [name] awareness of abnormal movements (rate only patient's report): No Awareness, Dental Status Current problems with teeth and/or dentures?: No Does patient usually wear dentures?: No    Musculoskeletal: Strength & Muscle Tone: within normal limits Gait & Station: normal Patient leans: N/A  Psychiatric Specialty Exam:  Presentation  General Appearance: Disheveled  Eye Contact:Fleeting  Speech:Slow; Slurred  Speech Volume:Decreased  Handedness:Right   Mood and Affect  Mood:Dysphoric; Irritable  Affect:Labile And suspicious  Thought Process  Thought Processes:Disorganized  Descriptions of Associations:Loose  Orientation:Partial  Thought Content:Delusions; Illogical;  Paranoid Ideation; Rumination  History of Schizophrenia/Schizoaffective disorder:Yes  Duration of Psychotic Symptoms:Greater than six months  Hallucinations:Hallucinations: None   Ideas of Reference:Delusions; Paranoia; Percusatory  Suicidal Thoughts:Suicidal Thoughts: No   Homicidal Thoughts:Homicidal Thoughts: No    Sensorium  Memory:Immediate Fair  Judgment:Poor  Insight:Poor   Executive Functions  Concentration:Poor  Attention Span:Poor  [place]   Psychomotor Activity  Psychomotor Activity:Psychomotor Activity: Normal    Assets  Assets:Leisure Time   Sleep  Sleep:Sleep: Fair Number of Hours of Sleep: 6.75     Physical Exam: Physical Exam Vitals and nursing note reviewed.  Constitutional:      Appearance: Normal appearance.  HENT:     Head: Normocephalic and atraumatic.     Mouth/Throat:     Mouth: Mucous membranes are moist.     Pharynx: Oropharynx is clear.  Pulmonary:     Effort: Pulmonary effort is normal.  Skin:    General: Skin is warm and dry.  Neurological:     General: No focal deficit present.     Mental Status: She is alert and oriented to person, place, and time.     Motor: No weakness.   Review of Systems  Constitutional:  Positive for malaise/fatigue.  Respiratory:  Negative for shortness of breath.   Cardiovascular:  Negative for chest pain.  Gastrointestinal:  Negative for diarrhea, nausea and vomiting.  Genitourinary: Negative.   Musculoskeletal: Negative.   Neurological:  Negative for weakness and headaches.  Blood pressure (!) 128/97, pulse 90, temperature 97.8 F (36.6 C), temperature source Oral, resp. rate 18, height 5\' 8"  (1.727 m), weight 117.5 kg, SpO2 100 %. Body mass index is 39.38 kg/m.   ASSESSMENT: Principal Problem:   Schizoaffective disorder, bipolar type ([place]) Active Problems:   Prediabetes   Essential hypertension  Treatment Plan Summary: Daily  contact with patient to assess and evaluate symptoms and progress in treatment and Medication management     Safety and Monitoring: voluntarily admission to inpatient psychiatric unit for safety, stabilization and treatment Daily contact with patient to assess and evaluate symptoms and progress in treatment [name] case to be discussed in multi-disciplinary team meeting Observation Level : q15 minute checks Vital signs: q12  hours Precautions: suicide, elopement, and assault   2.  Psychiatric diagnosis #Schizoaffective disorder-bipolar type #PTSD Patient reports medication compliance, but unsure when patient received received last [place] injection.  Per documentation, patient received injection on 11/17.  She reports receiving next injection 12/6, but documentation shows that was stated discharged without receiving an injection.  We will reach out to [facility] on Monday to confirm; attempts were made today were futile. - Continue Depakote ER 1000 mg nightly for mood stabilization (started 12/16; VPA level to be checked 12/19); patient reports discontinuing Depakote 2/2 weight gain, but may have decompensated without it. - Zyprexa 10 mg nightly for psychotic symptoms.  Invega 6 mg daily and may administer LAI if she missed the last dose.  If patient has received last LAI, we will begin work-up for Clozaril, as patient will have failed the Invega.   3. Medical Management Covid negative CMP: Unremarkable CBC: Hgb 10.9 EtOH: <10 UDS: Negative TSH: 1.276 A1C: 5.8% Lipids: Unremarkable    #Prediabetes #History of dyslipidemia - Initiated metformin 500 mg once daily for prediabetes and prevention of antipsychotic induced metabolic syndrome - Continue home atorvastatin 10 mg daily  #Asthma - Continue home Dulera 2 puffs twice daily  Discharge Planning:              -- Social work and case management to assist with discharge planning and identification of hospital follow-up needs  prior to discharge.              -- Estimated LOS: 5-7 days             -- Discharge Concerns: Need to establish a safety plan; Medication compliance and effectiveness             -- Discharge Goals: Return home with outpatient referrals for mental health follow-up including medication management/psychotherapy   Maida Sale, MD 08/14/2021, 4:45 PM

## 2021-08-14 NOTE — Progress Notes (Signed)
D: Patient would not get out of bed this morning to come to medication window. She states, "I'm in pain." Took medications to her room and included some ibuprofen for generalized pain. Patient stays isolative to her room; however, she does come out for meals. She denies any thoughts of self harm; she appears to be responding to internal stimuli. Patient is on her menses. Earlier, she attended group and left blood stains in the chair. She also had to come back from lunch for same. Her bed was changed and she was given pads and new underwear. Her speech is disorganized and tangential. Patient signed 72-hour discharge on 12.17 at 2125.  A: Continue to monitor medication management and MD orders.  Safety checks completed every 15 minutes per protocol.  Offer support and encouragement as needed.  R: Patient remains isolative and withdrawn.      08/14/21 1000  Psych Admission Type (Psych Patients Only)  Admission Status Voluntary  Psychosocial Assessment  Patient Complaints Other (Comment) (pain)  Eye Contact Avoids  Facial Expression Sullen  Affect Sullen  Speech Pressured  Interaction Minimal  Motor Activity Slow  Appearance/Hygiene Unremarkable  Behavior Characteristics Anxious;Calm  Mood Preoccupied  Thought Process  Coherency Tangential  Content Preoccupation;Delusions  Delusions Paranoid;Religious  Perception WDL  Hallucination None reported or observed  Judgment Impaired  Confusion Mild  Danger to Self  Current suicidal ideation? Denies  Danger to Others  Danger to Others None reported or observed

## 2021-08-14 NOTE — Progress Notes (Signed)
Adult Psychoeducational Group Note  Date:  08/14/2021 Time:  8:45 PM  Group Topic/Focus:  Wrap-Up Group:   The focus of this group is to help patients review their daily goal of treatment and discuss progress on daily workbooks.  Participation Level:  Active  Participation Quality:  Appropriate  Affect:  Appropriate  Cognitive:  Appropriate  Insight: Appropriate  Engagement in Group:  Engaged  Modes of Intervention:  Discussion  Additional Comments:  Pt stated her goal for today was to focus on her treatment plan. Pt stated she accomplished her goal today. Pt stated she talked with her doctor and her social worker about her care today. Pt rated her overall day a 5 out of 10. Pt stated she made no calls today. Pt stated she felt better about herself tonight. Pt stated she was able to attend all groups held today. Pt stated she was able to attend all meals. Pt stated she took all medications provided today. Pt stated her appetite was pretty good today. Pt rated her sleep last night was fair. Pt stated the goal tonight was to get some rest. Pt stated she had no physical pain tonight. Pt deny visual hallucinations and auditory issues tonight. Pt denies thoughts of harming herself or others. Pt stated she would alert staff if anything changed.  Candy Sledge 08/14/2021, 8:45 PM

## 2021-08-14 NOTE — Progress Notes (Signed)
Pt in stable mood. Pt seen interacting positively with peers in dayroom. Pt verbalized she had a good day. Pt complained of feeling anxious and worried about her bleeding through her clothes during her menstrual cycle. Pt verbalized having some pelvic cramping rated as 3/10. Pt denies SI/HI. Denies AVH.    Pt given PRN pain medication for cramping. Pt assisted with wearing diapers and pads to reduce her from "bleeding through" her clothing onto furniture. Continue to monitor medication management and MD orders.  Safety checks completed every 15 minutes per protocol.   Pt verbalized decrease in cramping after pain meds and is less anxious now that she has a diaper on.   08/14/21 2015  Psych Admission Type (Psych Patients Only)  Admission Status Voluntary  Psychosocial Assessment  Patient Complaints Worrying;Anxiety  Eye Contact Brief  Facial Expression Flat  Affect Flat  Speech Logical/coherent  Interaction Minimal  Motor Activity Slow  Appearance/Hygiene In hospital gown;In scrubs  Behavior Characteristics Anxious;Appropriate to situation;Cooperative  Mood Preoccupied  Thought Process  Coherency Circumstantial  Content Preoccupation;Delusions  Delusions Paranoid;Religious  Perception WDL  Hallucination None reported or observed  Judgment Impaired  Confusion Mild  Danger to Self  Current suicidal ideation? Denies  Danger to Others  Danger to Others None reported or observed

## 2021-08-14 NOTE — Group Note (Signed)
[facility] LCSW Group Therapy Note  Date/Time:  08/14/2021  11:00AM-12:00PM  Type of Therapy and Topic:  Group Therapy:  Music and Mood  Participation Level:  Active   Description of Group: In this process group, members listened to a variety of genres of music and identified that different types of music evoke different responses.  Patients were encouraged to identify music that was soothing for them and music that was energizing for them.  Patients discussed how this knowledge can help with wellness and recovery in various ways including managing depression and anxiety as well as encouraging healthy sleep habits.    Therapeutic Goals: Patients will explore the impact of different varieties of music on mood Patients will verbalize the thoughts they have when listening to different types of music Patients will identify music that is soothing to them as well as music that is energizing to them Patients will discuss how to use this knowledge to assist in maintaining wellness and recovery Patients will explore the use of music as a coping skill  Summary of Patient Progress:  At the beginning of group, patient expressed her mood was hurt "physically and mentally like I'm wounded."  She did try to analyze most of the songs even though she was repeatedly told that this was not the purpose of this particular group.  At the end of group, patient expressed her mood was better.  She also shared that she feels now whether your life is good or bad is up to you, and you make it good or bad.    Therapeutic Modalities: Solution Focused Brief Therapy Activity   Selmer Dominion, LCSW

## 2021-08-14 NOTE — [facility] Group Notes (Signed)
Adult Psychoeducational Group Not Date:  08/14/2021 Time:  0900-1045 Group Topic/Focus: PROGRESSIVE RELAXATION. A group where deep breathing is taught and tensing and relaxation muscle groups is used. Imagery is used as well.  Pts are asked to imagine 3 pillars that hold them up when they are not able to hold themselves up.  Participation Level:  Active  Participation Quality:  Appropriate  Affect:  Appropriate  Cognitive:  Oriented  Insight: Improving  Engagement in Group:  Engaged  Modes of Intervention:  Activity, Discussion, Education, and Support  Additional Comments:    [name]

## 2021-08-15 DIAGNOSIS — F250 Schizoaffective disorder, bipolar type: Secondary | ICD-10-CM | POA: Diagnosis not present

## 2021-08-15 MED ORDER — OLANZAPINE 5 MG PO TABS
5.0000 mg | ORAL_TABLET | Freq: Every day | ORAL | Status: DC
  Administered 2021-08-15 – 2021-08-17 (×3): 5 mg via ORAL
  Filled 2021-08-15 (×5): qty 1

## 2021-08-15 MED ORDER — HYDROXYZINE HCL 50 MG PO TABS
50.0000 mg | ORAL_TABLET | Freq: Every evening | ORAL | Status: DC | PRN
  Administered 2021-08-15 – 2021-08-22 (×5): 50 mg via ORAL
  Filled 2021-08-15 (×5): qty 1

## 2021-08-15 MED ORDER — MELATONIN 3 MG PO TABS
3.0000 mg | ORAL_TABLET | Freq: Every day | ORAL | Status: DC
  Administered 2021-08-15 – 2021-08-24 (×10): 3 mg via ORAL
  Filled 2021-08-15 (×13): qty 1

## 2021-08-15 NOTE — NC FL2 (Signed)
[place] MEDICAID FL2 LEVEL OF CARE SCREENING TOOL     IDENTIFICATION  Patient Name: [name] Birthdate: [birth year] Sex: female Admission Date (Current Location): 08/11/2021  [place] and [place] Number:  Kathleen Argue [id number] [place] and Address:  [facility] [place], [place] [address] [place], [place] [address]      Provider Number: [id number]  Attending Physician Name and Address:  Janine Limbo, MD  Relative Name and Phone Number:  Daughter, Lilli Few [phone])    Current Level of Care: Hospital Recommended Level of Care: [place], [place], Other (Comment) (Group Home) Prior Approval Number:    Date Approved/Denied:   PASRR Number:    Discharge Plan: Other (Comment) ([place])    Current Diagnoses: Patient Active Problem List   Diagnosis Date Noted   Schizophrenia ([place]) 08/11/2021   Bipolar disorder, manic ([place]) 02/02/2020   Encounter for medical clearance for patient hold    Prediabetes 08/26/2015   Essential hypertension 08/26/2015   Seizures ([place]) 08/26/2015   Noncompliance with treatment 07/16/2015   Schizoaffective disorder, bipolar type ([place]) 01/11/2015    Orientation RESPIRATION BLADDER Height & Weight     Self, Time, Situation, Place  Normal Continent Weight: 117.5 kg Height:  5\' 8"  (172.7 cm)  BEHAVIORAL SYMPTOMS/MOOD NEUROLOGICAL BOWEL NUTRITION STATUS      Continent Diet (Normal)  AMBULATORY STATUS COMMUNICATION OF NEEDS Skin   Independent Verbally Normal                       Personal Care Assistance Level of Assistance  Bathing, Feeding, Dressing, Total care Bathing Assistance: Independent Feeding assistance: Independent Dressing Assistance: Independent Total Care Assistance: Independent   Functional Limitations Info  Sight, Speech, Hearing Sight Info: Adequate Hearing Info: Adequate Speech Info: Adequate    SPECIAL CARE FACTORS FREQUENCY                        Contractures Contractures Info: Not present    Additional Factors Info  Code Status, Allergies Code Status Info: FULL Allergies Info: Haldol, Penicillins, shrimp           Current Medications (08/15/2021):  This is the current hospital active medication list Current Facility-Administered Medications  Medication Dose Route Frequency Provider Last Rate Last Admin   ascorbic acid (VITAMIN C) tablet 1,000 mg  1,000 mg Oral Daily Suella Broad, FNP   1,000 mg at 08/15/21 1136   atorvastatin (LIPITOR) tablet 10 mg  10 mg Oral Daily Suella Broad, FNP   10 mg at 08/15/21 0756   divalproex (DEPAKOTE ER) 24 hr tablet 1,000 mg  1,000 mg Oral QHS Rosezetta Schlatter, MD   1,000 mg at 08/14/21 2013   docusate sodium (COLACE) capsule 100 mg  100 mg Oral Daily Rosezetta Schlatter, MD   100 mg at 08/15/21 0756   hydrOXYzine (ATARAX) tablet 25 mg  25 mg Oral TID Suella Broad, FNP   25 mg at 08/15/21 1136   ibuprofen (ADVIL) tablet 400 mg  400 mg Oral Q6H PRN Suella Broad, FNP   400 mg at 08/14/21 2015   magnesium hydroxide (MILK OF MAGNESIA) suspension 30 mL  30 mL Oral Daily PRN Suella Broad, FNP       metFORMIN (GLUCOPHAGE) tablet 500 mg  500 mg Oral Q breakfast Rosezetta Schlatter, MD   500 mg at 08/15/21 0756   mometasone-formoterol (DULERA) 200-5 MCG/ACT inhaler 2 puff  2 puff Inhalation BID Suella Broad, FNP   2 puff at 08/15/21 0756   OLANZapine (ZYPREXA) tablet 10 mg  10 mg Oral QHS Rosezetta Schlatter, MD   10 mg at 08/14/21 2013   simethicone (MYLICON) chewable tablet 80 mg  80 mg Oral QID PRN Rosezetta Schlatter, MD       traZODone (DESYREL) tablet 100 mg  100 mg Oral QHS PRN Suella Broad, FNP   100 mg at 08/14/21 2014     Discharge Medications: Please see discharge summary for a list of discharge medications.  Relevant Imaging Results:  Relevant Lab Results:   Additional Information S.S. [id number]  Vassie Moselle,  LCSW

## 2021-08-15 NOTE — Progress Notes (Signed)
Adult Psychoeducational Group Note  Date:  08/15/2021 Time:  9:27 PM  Group Topic/Focus:  Wrap-Up Group:   The focus of this group is to help patients review their daily goal of treatment and discuss progress on daily workbooks.  Participation Level:  Active  Participation Quality:  Appropriate  Affect:  Appropriate and Excited  Cognitive:  Appropriate  Insight: Appropriate  Engagement in Group:  Engaged  Modes of Intervention:  Discussion  Additional Comments:  Pt stated her goal for today was to focus on her treatment plan and talk with her Social worker about aftercare placement. Pt stated she accomplished her goals today. Pt stated she talked with her doctor and her social worker about her care today. Pt rated her overall day a 9 out of 10. Pt stated she was able to contact her uncle, mother, daughter, and her Father coming for visitation tonight improved her overall day. Pt stated she felt better about herself tonight. Pt stated she was able to attend all groups held today. Pt stated she was able to attend all meals. Pt stated she took all medications provided today. Pt stated her appetite was pretty good today. Pt rated her sleep last night was pretty good. Pt stated the goal tonight was to get some rest. Pt stated she had no physical pain tonight. Pt deny visual hallucinations and auditory issues tonight. Pt denies thoughts of harming herself or others. Pt stated she would alert staff if anything changed.  Candy Sledge 08/15/2021, 9:27 PM

## 2021-08-15 NOTE — Progress Notes (Signed)
Pharmacy:  [facility]  Invega sustenna 234 mg inj LF 07/22/21 at [facility]. Fill prior to that was 07/06/21 and has been consistently filled at [facility] since 02/24/21  Einar Grad, Pharm D

## 2021-08-15 NOTE — [facility] Suicide Risk Assessment (Signed)
[place] INPATIENT:  Family/Significant Other Suicide Prevention Education  Suicide Prevention Education:  Education Completed; daughter Lilli Few [phone]),  has been identified by the patient as the family member/significant other with whom the patient will be residing, and identified as the person(s) who will aid the patient in the event of a mental health crisis (suicidal ideations/suicide attempt).  With written consent from the patient, the family member/significant other has been provided the following suicide prevention education, prior to the and/or following the discharge of the patient.  The suicide prevention education provided includes the following: Suicide risk factors Suicide prevention and interventions National Suicide Hotline telephone number [facility] assessment telephone number [facility] Emergency Assistance [place] and/or Residential Mobile Crisis Unit telephone number  Request made of family/significant other to: Remove weapons (e.g., guns, rifles, knives), all items previously/currently identified as safety concern.   Remove drugs/medications (over-the-counter, prescriptions, illicit drugs), all items previously/currently identified as a safety concern.  The family member/significant other verbalizes understanding of the suicide prevention education information provided.  The family member/significant other agrees to remove the items of safety concern listed above.  Dixie Jafri A Darrin Koman 08/15/2021, 3:47 PM

## 2021-08-15 NOTE — Progress Notes (Signed)
[facility] MD Progress Note  08/15/2021 3:04 PM [name]  MRN:  [id number] Subjective:  [name] is a 50 year old female with a psychiatric history of schizoaffective disorder-bipolar type and a medical history of asthma and dyslipidemia who presented voluntarily and admitted for disorganized thoughts and paranoia.  Chart Review of Past 24 hrs: The patient's chart was reviewed and nursing notes were reviewed. The patient's case was discussed in multidisciplinary team meeting.  Per MAR: - Patient is compliant with scheduled meds. - PRNs: Advil 400 mg x 1, Trazodone 100 mg x 1. Per RN notes, no documented behavioral issues and is attending group. Patient slept 6.75 hours  Patient had the following psychiatric recommendations yesterday:  - Continue Depakote ER 1000 mg nightly for mood stabilization (started 12/16; VPA level to be checked 12/19); patient reports discontinuing Depakote 2/2 weight gain, but may have decompensated without it. - Zyprexa 10 mg nightly for psychotic symptoms.  Invega 6 mg daily and may administer LAI if she missed the last dose.  If patient has received last LAI, we will begin work-up for Clozaril, as patient will have failed the Invega.    On Today's Assessment (08/15/2021): Case was discussed in the multidisciplinary team. MAR was reviewed and patient was compliant with medications.   She reports she is feeling "alright" but a bit unsteady on her feet today. She reports sleeping overnight and an intact appetite (selective diet). She is still concerned that she is going to be confused for a transgender person, and again reassured that she would not.  She denies SI/HI/VH, but reports auditory hallucinations of a familiar female voice reminding her that things will be okay.  She reports receiving messages from the television last night while watching football; her husband or a "bad man" told her to sleep with the devil.  She reports dizziness upon standing, but denies all other  somatic symptoms.  This morning, she reports meeting with her ACT team who stated that they will help her with obtaining housing either at [facility] or in a group home, with which she is pleased.  Her ACT team confirmed that she received her LAI on 12/6.  Since injection was received, our team discussed with patient how Invega LAI alone may not be sufficient, and we believe that Clozaril would be a good addition to her regimen.  Risks and benefits, as well as initial work-up and routine monitoring were discussed with the patient, and she was agreeable.  She rescinded her 72-hour form at 1430 today.  Principal Problem: Schizoaffective disorder, bipolar type ([place]) Diagnosis: Principal Problem:   Schizoaffective disorder, bipolar type ([place]) Active Problems:   Prediabetes   Essential hypertension  Total Time spent with patient: 30 minutes  Past Psychiatric History: See H&P  Past Medical History:  Past Medical History:  Diagnosis Date   Anxiety    Asthma    Bipolar 1 disorder ([place])    Depression    Gallstones 08/2020   Hypertension    Insomnia, persistent    Prediabetes    Schizophrenic disorder ([place])    Seizures ([place])     Past Surgical History:  Procedure Laterality Date   BREAST LUMPECTOMY Left 07/2013   TONSILLECTOMY     TUBAL LIGATION  2002   Family History:  Family History  Problem Relation Age of Onset   Depression Mother    Gout Mother    Cancer Father        prostate   Other Father  lung issue   Alcoholism Other    Heart attack Paternal Grandfather    Heart attack Paternal Grandmother    Heart attack Maternal Grandmother    Heart attack Maternal Grandfather    Depression Son    Anxiety disorder Son    Family Psychiatric  History: See H&P Social History:  Social History   Substance and Sexual Activity  Alcohol Use Not Currently     Social History   Substance and Sexual Activity  Drug Use No    Social History   Socioeconomic History    Marital status: Single    Spouse name: Not on file   Number of children: Not on file   Years of education: Not on file   Highest education level: Not on file  Occupational History   Not on file  Tobacco Use   Smoking status: Former    Types: Cigarettes   Smokeless tobacco: Never  Vaping Use   Vaping Use: Never used  Substance and Sexual Activity   Alcohol use: Not Currently   Drug use: No   Sexual activity: Not Currently  Other Topics Concern   Not on file  Social History Narrative   Not on file   Social Determinants of Health   Financial Resource Strain: Not on file  Food Insecurity: Food Insecurity Present   Worried About [place] in the Last Year: Sometimes true   Ran Out of Food in the Last Year: Sometimes true  Transportation Needs: No Transportation Needs   Lack of Transportation (Medical): No   Lack of Transportation (Non-Medical): No  Physical Activity: Not on file  Stress: Not on file  Social Connections: Not on file   Additional Social History:         Sleep: Good  Appetite:  Good  Current Medications: Current Facility-Administered Medications  Medication Dose Route Frequency Provider Last Rate Last Admin   ascorbic acid (VITAMIN C) tablet 1,000 mg  1,000 mg Oral Daily Suella Broad, FNP   1,000 mg at 08/15/21 1136   atorvastatin (LIPITOR) tablet 10 mg  10 mg Oral Daily Suella Broad, FNP   10 mg at 08/15/21 0756   divalproex (DEPAKOTE ER) 24 hr tablet 1,000 mg  1,000 mg Oral QHS Rosezetta Schlatter, MD   1,000 mg at 08/14/21 2013   docusate sodium (COLACE) capsule 100 mg  100 mg Oral Daily Rosezetta Schlatter, MD   100 mg at 08/15/21 0756   hydrOXYzine (ATARAX) tablet 25 mg  25 mg Oral TID Suella Broad, FNP   25 mg at 08/15/21 1136   hydrOXYzine (ATARAX) tablet 50 mg  50 mg Oral QHS PRN Rosezetta Schlatter, MD       ibuprofen (ADVIL) tablet 400 mg  400 mg Oral Q6H PRN Suella Broad, FNP   400 mg at 08/14/21 2015    magnesium hydroxide (MILK OF MAGNESIA) suspension 30 mL  30 mL Oral Daily PRN Starkes-Perry, Gayland Curry, FNP       melatonin tablet 3 mg  3 mg Oral QHS Rosezetta Schlatter, MD       metFORMIN (GLUCOPHAGE) tablet 500 mg  500 mg Oral Q breakfast Rosezetta Schlatter, MD   500 mg at 08/15/21 0756   mometasone-formoterol (DULERA) 200-5 MCG/ACT inhaler 2 puff  2 puff Inhalation BID Suella Broad, FNP   2 puff at 08/15/21 0756   OLANZapine (ZYPREXA) tablet 5 mg  5 mg Oral QHS Rosezetta Schlatter, MD  simethicone (MYLICON) chewable tablet 80 mg  80 mg Oral QID PRN Rosezetta Schlatter, MD        Lab Results:  Results for orders placed or performed during the hospital encounter of 08/11/21 (from the past 48 hour(s))  HIV Antibody (routine testing w rflx)     Status: None   Collection Time: 08/13/21  6:51 PM  Result Value Ref Range   HIV Screen 4th Generation wRfx Non Reactive Non Reactive    Comment: Performed at [facility], [place] [address] [place], [place] [address]    Blood Alcohol level:  Lab Results  Component Value Date   [facility] <10 08/11/2021   ETH <10 48/18/5631    Metabolic Disorder Labs: Lab Results  Component Value Date   HGBA1C 5.8 (H) 08/12/2021   MPG 119.76 08/12/2021   MPG 125.5 04/27/2021   Lab Results  Component Value Date   PROLACTIN 74.6 (H) 09/28/2019   PROLACTIN 4.2 (L) 12/19/2018   Lab Results  Component Value Date   CHOL 152 08/12/2021   TRIG 81 08/12/2021   HDL 41 08/12/2021   CHOLHDL 3.7 08/12/2021   VLDL 16 08/12/2021   LDLCALC 95 08/12/2021   LDLCALC 125 (H) 04/27/2021    Physical Findings: AIMS: Facial and Oral Movements Muscles of Facial Expression: None, normal Lips and Perioral Area: None, normal Jaw: None, normal Tongue: None, normal,Extremity Movements Upper (arms, wrists, hands, fingers): None, normal Lower (legs, knees, ankles, toes): None, normal, Trunk Movements Neck, shoulders, hips: None, normal, Overall Severity Severity of abnormal  movements (highest score from questions above): None, normal Incapacitation due to abnormal movements: None, normal [name] awareness of abnormal movements (rate only patient's report): No Awareness, Dental Status Current problems with teeth and/or dentures?: No Does patient usually wear dentures?: No    Musculoskeletal: Strength & Muscle Tone: within normal limits Gait & Station: normal Patient leans: N/A  Psychiatric Specialty Exam:  Presentation  General Appearance: Disheveled  Eye Contact:Fleeting  Speech:Slurred; Pressured (With flight of ideas)  Speech Volume:Normal  Handedness:Right   Mood and Affect  Mood:Anxious  Affect:Congruent; Constricted (Anxious)   Thought Process  Thought Processes:Goal Directed  Descriptions of Associations:Tangential  Orientation:Partial  Thought Content:Illogical; Paranoid Ideation; Rumination; Tangential  History of Schizophrenia/Schizoaffective disorder:Yes  Duration of Psychotic Symptoms:Greater than six months  Hallucinations:Hallucinations: Auditory Description of Auditory Hallucinations: Familiar female voice that says everything will be okay   Ideas of Reference:Paranoia  Suicidal Thoughts:Suicidal Thoughts: No   Homicidal Thoughts:Homicidal Thoughts: No    Sensorium  Memory:Immediate Fair; Recent Fair  Judgment:Impaired  Insight:Poor; Lacking   Executive Functions  Concentration:Poor  Attention Span:Poor  [place]   Psychomotor Activity  Psychomotor Activity:Psychomotor Activity: Normal    Assets  Assets:Housing; Leisure Time   Sleep  Sleep:Sleep: Fair Number of Hours of Sleep: 0 (Hours not documented)     Physical Exam: Physical Exam Vitals and nursing note reviewed.  Constitutional:      Appearance: Normal appearance.  HENT:     Head: Normocephalic and atraumatic.     Mouth/Throat:     Mouth: Mucous membranes are moist.      Pharynx: Oropharynx is clear.  Pulmonary:     Effort: Pulmonary effort is normal.  Skin:    General: Skin is warm and dry.  Neurological:     General: No focal deficit present.     Mental Status: She is alert and oriented to person, place, and time.  Motor: No weakness.   Review of Systems  Constitutional:  Negative for malaise/fatigue.  Respiratory:  Negative for shortness of breath.   Cardiovascular:  Negative for chest pain.  Gastrointestinal:  Negative for diarrhea, nausea and vomiting.  Genitourinary: Negative.   Musculoskeletal: Negative.   Neurological:  Positive for dizziness. Negative for weakness and headaches.  Blood pressure (!) 131/102, pulse (!) 102, temperature 97.7 F (36.5 C), resp. rate 20, height 5' 8" (1.727 m), weight 117.5 kg, SpO2 99 %. Body mass index is 39.38 kg/m.   ASSESSMENT: Principal Problem:   Schizoaffective disorder, bipolar type ([place]) Active Problems:   Prediabetes   Essential hypertension  Treatment Plan Summary: Daily contact with patient to assess and evaluate symptoms and progress in treatment and Medication management     Safety and Monitoring: voluntarily admission to inpatient psychiatric unit for safety, stabilization and treatment Daily contact with patient to assess and evaluate symptoms and progress in treatment [name] case to be discussed in multi-disciplinary team meeting Observation Level : q15 minute checks Vital signs: q12 hours Precautions: suicide, elopement, and assault   2.  Psychiatric diagnosis #Schizoaffective disorder-bipolar type #PTSD Patient reports medication compliance, but unsure when patient received received last [place] injection.  Per documentation, patient received injection on 11/17.  [facility] ACT team confirmed that patient received injection 12/6. - Continue Depakote ER 1000 mg nightly for mood stabilization (started 12/16; VPA level to be checked a.m. 12/20); patient reports  discontinuing Depakote 2/2 weight gain, but may have decompensated without it. - Zyprexa decreased to 5 mg nightly 2/2 anticholinergic side effects.  ACT team confirmed that patient did receive LAI on 12/6, so we will begin work-up for Clozaril, as patient has failed the Invega as monotherapy (r/b/se/a to medication reviewed and she consents to med trial) .  Prior to initiation of medication, labs ordered: CBC with differential, fasting lipid panel, VPA level, troponin, CRP, ESR, BNP, echocardiogram; we will also assess for aims.  A1c 5.8%, EKG unremarkable, and beta hCG negative.   3. Medical Management Covid negative CMP: Unremarkable CBC: Hgb 10.9 EtOH: <10 UDS: Negative TSH: 1.276 A1C: 5.8% Lipids: Unremarkable    #Prediabetes #History of dyslipidemia - Initiated metformin 500 mg once daily for prediabetes and prevention of antipsychotic induced metabolic syndrome - Continue home atorvastatin 10 mg daily  #Asthma - Continue home Dulera 2 puffs twice daily  Discharge Planning:              -- Social work and case management to assist with discharge planning and identification of hospital follow-up needs prior to discharge.              -- Estimated LOS: 5-7 days             -- Discharge Concerns: Need to establish a safety plan; Medication compliance and effectiveness             -- Discharge Goals: Return home with outpatient referrals for mental health follow-up including medication management/psychotherapy   Rosezetta Schlatter, MD 08/15/2021, 3:04 PM

## 2021-08-15 NOTE — Final Progress Note (Signed)
Pt is scheduled for their echo at 0800 on 12/20. Pt is to go to cardio pulmonary at [facility] and doesn't need to register first. [place] in echo is aware she is coming.

## 2021-08-15 NOTE — Group Note (Signed)
LCSW Group Therapy Note  1:15pm  Type of Therapy and Topic:  Group Therapy:  Identity and Relationships  Participation Level:  Minimal  Description of Group: Using the Ungame patients were guided to express themselves about a variety of topics. Selected cards for this game included identity and relationships. Patients were able to discuss dealing with positive and negative situations, identifying supports, and other ways to understand your identity. Patients shared unique viewpoints but often had similar characteristics.  Patients encouraged to use this dialogue to develop goals and supports for future progress. Therapeutic Goals: Patient will discuss 2 positive and 2 negative situations in their life prior to admission Patient will identify 2 positive support persons in their home environment Patient will explore setting goals for themselves and identify supports they need to achieve these goals Patient will demonstrate empathy for others in the group by responding with positive affirmations of group members shares.  Summary of Patient Progress: Pt offered minimal engagement in group. Pt shared she would like to get back into church and to continue to build positive relationships.     Therapeutic Modalities: Motivational Interviewing Cognitive Behavioral Therapy   Darletta Moll MSW, LCSW Clincal Social Worker  [facility]

## 2021-08-15 NOTE — Progress Notes (Signed)
°   08/15/21 2300  Psych Admission Type (Psych Patients Only)  Admission Status Voluntary  Psychosocial Assessment  Patient Complaints Anxiety  Eye Contact Fair  Facial Expression Anxious;Pensive  Affect Anxious  Speech Logical/coherent;Slow  Interaction Assertive  Motor Activity Slow  Appearance/Hygiene In hospital gown  Behavior Characteristics Cooperative  Mood Anxious;Pleasant  Thought Process  Coherency Circumstantial  Content WDL  Delusions None reported or observed  Perception WDL  Hallucination None reported or observed  Judgment Poor  Confusion None  Danger to Self  Current suicidal ideation? Denies  Danger to Others  Danger to Others None reported or observed

## 2021-08-15 NOTE — Group Note (Signed)
Date:  08/15/2021 Time:  9:15 AM  Group Topic/Focus:  Goals Group:   The focus of this group is to help patients establish daily goals to achieve during treatment and discuss how the patient can incorporate goal setting into their daily lives to aide in recovery.    Participation Level:  Active  Participation Quality:  Appropriate and Attentive  Affect:  Appropriate  Cognitive:  Appropriate  Insight: Appropriate and Good  Engagement in Group:  Engaged  Modes of Intervention:  Discussion  Additional Comments:  pt has a goal to day to speak to the social worker about housing and stabilizing her medication.    [name] 08/15/2021, 9:15 AM

## 2021-08-15 NOTE — Group Note (Signed)
Recreation Therapy Group Note   Group Topic:Self-Esteem  Group Date: 08/15/2021 Start Time: 1010 End Time: 1050 Facilitators: Victorino Sparrow, LRT,CTRS Location: [address]   Affect/Mood: N/A   Participation Level: Did not attend    Clinical Observations/Individualized Feedback:     Plan: Continue to engage patient in RT group sessions 2-3x/week.   Victorino Sparrow, LRT,CTRS 08/15/2021 1:57 PM

## 2021-08-15 NOTE — Progress Notes (Signed)
Progress note  Pt found in bed; compliant with medication administration. Pt denies any physical complaints. Pt has been seen in the milieu. Pt is pleasant. Pt denies si/hi/ah/vh and verbally agrees to approach staff if these become apparent or before harming themselves/others while at [place].  A: Pt provided support and encouragement. Pt given medication per protocol and standing orders. Q62m safety checks implemented and continued.  R: Pt safe on the unit. Will continue to monitor.

## 2021-08-15 NOTE — Plan of Care (Signed)
  Problem: Education: Goal: Knowledge of [place] [facility] Education information/materials will improve Outcome: Progressing Goal: Emotional status will improve Outcome: Progressing Goal: Mental status will improve Outcome: Progressing   

## 2021-08-16 ENCOUNTER — Other Ambulatory Visit (HOSPITAL_COMMUNITY): Payer: Medicare Other

## 2021-08-16 DIAGNOSIS — F250 Schizoaffective disorder, bipolar type: Secondary | ICD-10-CM | POA: Diagnosis not present

## 2021-08-16 LAB — CBC WITH DIFFERENTIAL/PLATELET
Abs Immature Granulocytes: 0.02 10*3/uL (ref 0.00–0.07)
Basophils Absolute: 0 10*3/uL (ref 0.0–0.1)
Basophils Relative: 0 %
Eosinophils Absolute: 0.3 10*3/uL (ref 0.0–0.5)
Eosinophils Relative: 6 %
HCT: 35.2 % — ABNORMAL LOW (ref 36.0–46.0)
Hemoglobin: 11.3 g/dL — ABNORMAL LOW (ref 12.0–15.0)
Immature Granulocytes: 0 %
Lymphocytes Relative: 39 %
Lymphs Abs: 2.1 10*3/uL (ref 0.7–4.0)
MCH: 28.8 pg (ref 26.0–34.0)
MCHC: 32.1 g/dL (ref 30.0–36.0)
MCV: 89.6 fL (ref 80.0–100.0)
Monocytes Absolute: 0.4 10*3/uL (ref 0.1–1.0)
Monocytes Relative: 7 %
Neutro Abs: 2.7 10*3/uL (ref 1.7–7.7)
Neutrophils Relative %: 48 %
Platelets: 273 10*3/uL (ref 150–400)
RBC: 3.93 MIL/uL (ref 3.87–5.11)
RDW: 13.8 % (ref 11.5–15.5)
WBC: 5.5 10*3/uL (ref 4.0–10.5)
nRBC: 0 % (ref 0.0–0.2)

## 2021-08-16 LAB — TROPONIN I (HIGH SENSITIVITY): Troponin I (High Sensitivity): 2 ng/L (ref <18)

## 2021-08-16 LAB — LIPID PANEL
Cholesterol: 125 mg/dL (ref 0–200)
HDL: 40 mg/dL — ABNORMAL LOW (ref >40)
LDL Cholesterol: 74 mg/dL (ref 0–99)
Total CHOL/HDL Ratio: 3.1 RATIO
Triglycerides: 54 mg/dL (ref <150)
VLDL: 11 mg/dL (ref 0–40)

## 2021-08-16 LAB — SEDIMENTATION RATE: Sed Rate: 42 mm/hr — ABNORMAL HIGH (ref 0–22)

## 2021-08-16 LAB — C-REACTIVE PROTEIN: CRP: 0.9 mg/dL (ref <1.0)

## 2021-08-16 LAB — BRAIN NATRIURETIC PEPTIDE: B Natriuretic Peptide: 5.1 pg/mL (ref 0.0–100.0)

## 2021-08-16 LAB — VALPROIC ACID LEVEL: Valproic Acid Lvl: 77 ug/mL (ref 50.0–100.0)

## 2021-08-16 NOTE — Progress Notes (Signed)
Pt visible on the unit, pt continues to be disorganized  at times, but pt is not far from her baseline from previous admissions . Pt has not presented hyper religous and hard to redirect like past admissions.     08/16/21 2300  Psych Admission Type (Psych Patients Only)  Admission Status Voluntary  Psychosocial Assessment  Patient Complaints Anxiety;Worrying  Eye Contact Fair  Facial Expression Animated;Anxious  Affect Anxious;Appropriate to circumstance  Speech Logical/coherent;Pressured  Interaction Assertive  Motor Activity Slow  Appearance/Hygiene In scrubs  Behavior Characteristics Cooperative  Mood Anxious;Pleasant;Preoccupied  Thought Process  Coherency Circumstantial  Content WDL  Delusions WDL  Perception WDL  Hallucination None reported or observed  Judgment WDL  Confusion None  Danger to Self  Current suicidal ideation? Denies  Danger to Others  Danger to Others None reported or observed

## 2021-08-16 NOTE — Progress Notes (Signed)
[facility] MD Progress Note  08/16/2021 2:29 PM [name]  MRN:  [id number] Subjective:  [name] is a 50 year old female with a psychiatric history of schizoaffective disorder-bipolar type and a medical history of asthma and dyslipidemia who presented voluntarily and admitted for disorganized thoughts and paranoia.  Chart Review of Past 24 hrs: The patient's chart was reviewed and nursing notes were reviewed. The patient's case was discussed in multidisciplinary team meeting.  Per MAR: - Patient is compliant with scheduled meds. - PRNs: Advil 400 mg x 1, Trazodone 100 mg x 1. Per RN notes, no documented behavioral issues and is attending group. Patient slept 7 hours  Patient had the following psychiatric recommendations yesterday:  - Continue Depakote ER 1000 mg nightly for mood stabilization (started 12/16; VPA level to be checked a.m. 12/20); patient reports discontinuing Depakote 2/2 weight gain, but may have decompensated without it. - Zyprexa decreased to 5 mg nightly 2/2 anticholinergic side effects.  ACT team confirmed that patient did receive LAI on 12/6, so we will begin work-up for Clozaril, as patient has failed the Invega as monotherapy (r/b/se/a to medication reviewed and she consents to med trial) .             Prior to initiation of medication, labs ordered: CBC with differential, fasting lipid panel, VPA level, troponin, CRP, ESR, BNP, echocardiogram; we will also assess for aims.  A1c 5.8%, EKG unremarkable, and beta hCG negative.    On Today's Assessment (08/16/2021): Case was discussed in the multidisciplinary team. MAR was reviewed and patient was compliant with medications.   She reports she is feeling "alright" today, a bit improved now that she knows her [name] is working on placement. She reports sleeping overnight and an intact appetite. She remains disorganized and tangential but is slightly more interruptible today; per [name], she has also mildly improved from their  standpoint.  She denies SI/HI/AVH, but remains paranoid about HIV (although reassured the test is negative) and being confused for a transgendered person. She denies all  somatic symptoms, reporting that she no longer feels unsteady. Clozaril workup and management is again discussed and patient remains agreeable to a trial of the medication.    Principal Problem: Schizoaffective disorder, bipolar type ([place]) Diagnosis: Principal Problem:   Schizoaffective disorder, bipolar type ([place]) Active Problems:   Prediabetes   Essential hypertension  Total Time spent with patient: 30 minutes  Past Psychiatric History: See H&P  Past Medical History:  Past Medical History:  Diagnosis Date   Anxiety    Asthma    Bipolar 1 disorder ([place])    Depression    Gallstones 08/2020   Hypertension    Insomnia, persistent    Prediabetes    Schizophrenic disorder ([place])    Seizures ([place])     Past Surgical History:  Procedure Laterality Date   BREAST LUMPECTOMY Left 07/2013   TONSILLECTOMY     TUBAL LIGATION  2002   Family History:  Family History  Problem Relation Age of Onset   Depression Mother    Gout Mother    Cancer Father        prostate   Other Father        lung issue   Alcoholism Other    Heart attack Paternal Grandfather    Heart attack Paternal Grandmother    Heart attack Maternal Grandmother    Heart attack Maternal Grandfather    Depression Son    Anxiety disorder Son    Family Psychiatric  History: See H&P Social History:  Social History   Substance and Sexual Activity  Alcohol Use Not Currently     Social History   Substance and Sexual Activity  Drug Use No    Social History   Socioeconomic History   Marital status: Single    Spouse name: Not on file   Number of children: Not on file   Years of education: Not on file   Highest education level: Not on file  Occupational History   Not on file  Tobacco Use   Smoking status: Former    Types: Cigarettes    Smokeless tobacco: Never  Vaping Use   Vaping Use: Never used  Substance and Sexual Activity   Alcohol use: Not Currently   Drug use: No   Sexual activity: Not Currently  Other Topics Concern   Not on file  Social History Narrative   Not on file   Social Determinants of Health   Financial Resource Strain: Not on file  Food Insecurity: Food Insecurity Present   Worried About [place] in the Last Year: Sometimes true   Ran Out of Food in the Last Year: Sometimes true  Transportation Needs: No Transportation Needs   Lack of Transportation (Medical): No   Lack of Transportation (Non-Medical): No  Physical Activity: Not on file  Stress: Not on file  Social Connections: Not on file   Additional Social History:         Sleep: Good  Appetite:  Good (selective)  Current Medications: Current Facility-Administered Medications  Medication Dose Route Frequency Provider Last Rate Last Admin   ascorbic acid (VITAMIN C) tablet 1,000 mg  1,000 mg Oral Daily Suella Broad, FNP   1,000 mg at 08/16/21 0747   atorvastatin (LIPITOR) tablet 10 mg  10 mg Oral Daily Suella Broad, FNP   10 mg at 08/16/21 0747   divalproex (DEPAKOTE ER) 24 hr tablet 1,000 mg  1,000 mg Oral QHS Rosezetta Schlatter, MD   1,000 mg at 08/15/21 2046   docusate sodium (COLACE) capsule 100 mg  100 mg Oral Daily Rosezetta Schlatter, MD   100 mg at 08/16/21 0747   hydrOXYzine (ATARAX) tablet 25 mg  25 mg Oral TID Suella Broad, FNP   25 mg at 08/16/21 1141   hydrOXYzine (ATARAX) tablet 50 mg  50 mg Oral QHS PRN Rosezetta Schlatter, MD   50 mg at 08/15/21 2045   ibuprofen (ADVIL) tablet 400 mg  400 mg Oral Q6H PRN Suella Broad, FNP   400 mg at 08/16/21 1139   magnesium hydroxide (MILK OF MAGNESIA) suspension 30 mL  30 mL Oral Daily PRN Suella Broad, FNP       melatonin tablet 3 mg  3 mg Oral QHS Rosezetta Schlatter, MD   3 mg at 08/15/21 2045   metFORMIN (GLUCOPHAGE) tablet 500 mg   500 mg Oral Q breakfast Rosezetta Schlatter, MD   500 mg at 08/16/21 0747   mometasone-formoterol (DULERA) 200-5 MCG/ACT inhaler 2 puff  2 puff Inhalation BID Suella Broad, FNP   2 puff at 08/16/21 0746   OLANZapine (ZYPREXA) tablet 5 mg  5 mg Oral QHS Rosezetta Schlatter, MD   5 mg at 08/15/21 2045   simethicone (MYLICON) chewable tablet 80 mg  80 mg Oral QID PRN Rosezetta Schlatter, MD        Lab Results:  Results for orders placed or performed during the hospital encounter of 08/11/21 (from the past  48 hour(s))  C-reactive protein     Status: None   Collection Time: 08/16/21  6:25 AM  Result Value Ref Range   CRP 0.9 <1.0 mg/dL    Comment: Performed at [facility] [address], [place], [place] [address]  Sedimentation rate     Status: Abnormal   Collection Time: 08/16/21  6:25 AM  Result Value Ref Range   Sed Rate 42 (H) 0 - 22 mm/hr    Comment: Performed at [facility], [place] [address] [place], [place] [address]  Brain natriuretic peptide     Status: None   Collection Time: 08/16/21  6:25 AM  Result Value Ref Range   B Natriuretic Peptide 5.1 0.0 - 100.0 pg/mL    Comment: Performed at [facility], [place] [address], [place], [place] [address]  CBC with Differential/Platelet     Status: Abnormal   Collection Time: 08/16/21  6:25 AM  Result Value Ref Range   WBC 5.5 4.0 - 10.5 K/uL   RBC 3.93 3.87 - 5.11 MIL/uL   Hemoglobin 11.3 (L) 12.0 - 15.0 g/dL   HCT 35.2 (L) 36.0 - 46.0 %   MCV 89.6 80.0 - 100.0 fL   MCH 28.8 26.0 - 34.0 pg   MCHC 32.1 30.0 - 36.0 g/dL   RDW 13.8 11.5 - 15.5 %   Platelets 273 150 - 400 K/uL   nRBC 0.0 0.0 - 0.2 %   Neutrophils Relative % 48 %   Neutro Abs 2.7 1.7 - 7.7 K/uL   Lymphocytes Relative 39 %   Lymphs Abs 2.1 0.7 - 4.0 K/uL   Monocytes Relative 7 %   Monocytes Absolute 0.4 0.1 - 1.0 K/uL   Eosinophils Relative 6 %   Eosinophils Absolute 0.3 0.0 - 0.5 K/uL   Basophils Relative 0 %   Basophils  Absolute 0.0 0.0 - 0.1 K/uL   Immature Granulocytes 0 %   Abs Immature Granulocytes 0.02 0.00 - 0.07 K/uL    Comment: Performed at [facility], [place] [address], [place], [place] [address]  Lipid panel     Status: Abnormal   Collection Time: 08/16/21  6:25 AM  Result Value Ref Range   Cholesterol 125 0 - 200 mg/dL   Triglycerides 54 <150 mg/dL   HDL 40 (L) >40 mg/dL   Total CHOL/HDL Ratio 3.1 RATIO   VLDL 11 0 - 40 mg/dL   LDL Cholesterol 74 0 - 99 mg/dL    Comment:        Total Cholesterol/HDL:CHD Risk Coronary Heart Disease Risk Table                     Men   Women  1/2 Average Risk   3.4   3.3  Average Risk       5.0   4.4  2 X Average Risk   9.6   7.1  3 X Average Risk  23.4   11.0        Use the calculated Patient Ratio above and the CHD Risk Table to determine the patient's CHD Risk.        ATP III CLASSIFICATION (LDL):  <100     mg/dL   Optimal  100-129  mg/dL   Near or Above                    Optimal  130-159  mg/dL   Borderline  160-189  mg/dL  High  >190     mg/dL   Very High Performed at [facility] [address], [place], [place] [address]   Valproic acid level     Status: None   Collection Time: 08/16/21  6:25 AM  Result Value Ref Range   Valproic Acid Lvl 77 50.0 - 100.0 ug/mL    Comment: Performed at [facility], [place] [address], [place], [place] [address]  Troponin I (High Sensitivity)     Status: None   Collection Time: 08/16/21  6:25 AM  Result Value Ref Range   Troponin I (High Sensitivity) 2 <18 ng/L    Comment: (NOTE) Elevated high sensitivity troponin I (hsTnI) values and significant  changes across serial measurements may suggest ACS but many other  chronic and acute conditions are known to elevate hsTnI results.  Refer to the Links section for chest pain algorithms and additional  guidance. Performed at [facility], [place] [address] [place], [place] [address]      Blood Alcohol level:  Lab Results  Component Value Date   [facility] <10 08/11/2021   ETH <10 18/56/3149    Metabolic Disorder Labs: Lab Results  Component Value Date   HGBA1C 5.8 (H) 08/12/2021   MPG 119.76 08/12/2021   MPG 125.5 04/27/2021   Lab Results  Component Value Date   PROLACTIN 74.6 (H) 09/28/2019   PROLACTIN 4.2 (L) 12/19/2018   Lab Results  Component Value Date   CHOL 125 08/16/2021   TRIG 54 08/16/2021   HDL 40 (L) 08/16/2021   CHOLHDL 3.1 08/16/2021   VLDL 11 08/16/2021   LDLCALC 74 08/16/2021   LDLCALC 95 08/12/2021    Physical Findings: AIMS: Facial and Oral Movements Muscles of Facial Expression: None, normal Lips and Perioral Area: None, normal Jaw: None, normal Tongue: None, normal,Extremity Movements Upper (arms, wrists, hands, fingers): None, normal Lower (legs, knees, ankles, toes): None, normal, Trunk Movements Neck, shoulders, hips: None, normal, Overall Severity Severity of abnormal movements (highest score from questions above): None, normal Incapacitation due to abnormal movements: None, normal [name] awareness of abnormal movements (rate only patient's report): No Awareness, Dental Status Current problems with teeth and/or dentures?: No Does patient usually wear dentures?: No    Musculoskeletal: Strength & Muscle Tone: within normal limits Gait & Station: normal Patient leans: N/A  Psychiatric Specialty Exam:  Presentation  General Appearance: Casual; Fairly Groomed  Eye Contact:Good  Speech:Slurred; Pressured (Mildly less pressured)  Speech Volume:Normal  Handedness:Right   Mood and Affect  Mood:Euthymic; Anxious (Less anxious, more euthymic)  Affect:Congruent; Constricted   Thought Process  Thought Processes:Goal Directed  Descriptions of Associations:Tangential  Orientation:Partial  Thought Content:Rumination; Paranoid Ideation; Tangential; Logical; Illogical (Some moments of logical thought, but mostly  disorganized with illogical thought, paranoia, rumnination, and tangents.)  History of Schizophrenia/Schizoaffective disorder:Yes  Duration of Psychotic Symptoms:Greater than six months  Hallucinations:Hallucinations: -- (Denies today) Description of Auditory Hallucinations: Familiar female voice that says everything will be okay   Ideas of Reference:Paranoia  Suicidal Thoughts:Suicidal Thoughts: No   Homicidal Thoughts:Homicidal Thoughts: No    Sensorium  Memory:Immediate Fair; Recent Fair  Judgment:Fair (Compliant with meds, realizes her limitations in living alone so asked for help)  Insight:Fair (Improving; understands need for medications and that Invega alone isn't sufficient)   Executive Functions  Concentration:Poor  Attention Span:Poor  Reserve   Psychomotor Activity  Psychomotor Activity:Psychomotor Activity: Normal    Assets  Assets:Housing; Leisure Time  Sleep  Sleep:Sleep: [place] Number of Hours of Sleep: 7     Physical Exam: Physical Exam Vitals and nursing note reviewed.  Constitutional:      Appearance: Normal appearance.  HENT:     Head: Normocephalic and atraumatic.     Mouth/Throat:     Mouth: Mucous membranes are moist.     Pharynx: Oropharynx is clear.  Pulmonary:     Effort: Pulmonary effort is normal.  Skin:    General: Skin is warm and dry.  Neurological:     General: No focal deficit present.     Mental Status: She is alert and oriented to person, place, and time.     Motor: No weakness.   Review of Systems  Constitutional:  Negative for malaise/fatigue.  Respiratory:  Negative for shortness of breath.   Cardiovascular:  Negative for chest pain.  Gastrointestinal:  Negative for diarrhea, nausea and vomiting.  Genitourinary: Negative.   Musculoskeletal: Negative.   Neurological:  Positive for dizziness. Negative for weakness and headaches.  Blood pressure (!) 129/94,  pulse 93, temperature 97.8 F (36.6 C), temperature source Oral, resp. rate 18, height _0  (1.727 m), weight 117.5 kg, SpO2 100 %. Body mass index is 39.38 kg/m.   ASSESSMENT: Principal Problem:   Schizoaffective disorder, bipolar type ([place]) Active Problems:   Prediabetes   Essential hypertension  Treatment Plan Summary: Daily contact with patient to assess and evaluate symptoms and progress in treatment and Medication management     Safety and Monitoring: voluntarily admission to inpatient psychiatric unit for safety, stabilization and treatment Daily contact with patient to assess and evaluate symptoms and progress in treatment [name] case to be discussed in multi-disciplinary team meeting Observation Level : q15 minute checks Vital signs: q12 hours Precautions: suicide, elopement, and assault   2.  Psychiatric diagnosis #Schizoaffective disorder-bipolar type #PTSD Patient reports medication compliance, but unsure when patient received received last [place] injection.  Per documentation, patient received injection on 11/17.  [facility] ACT team confirmed that patient received injection 12/6. - Continue Depakote ER 1000 mg nightly for mood stabilization (started 12/16; VPA level 77; patient reports discontinuing Depakote 2/2 weight gain, but may have decompensated without it). - Zyprexa decreased to 5 mg nightly 2/2 anticholinergic side effects.  ACT team confirmed that patient did receive LAI on 12/6, so we will begin work-up for Clozaril, as patient has failed the Invega as monotherapy.  Prior to initiation of medication, labs ordered: CBC with differential- echocardiogram pending; Hgb 11.3, fasting lipid panel- HDL 40, VPA level- 77, troponin- 2, CRP- 0.9, ESR- 42, BNP- 5.1, A1c 5.8%, EKG unremarkable, and beta hCG negative.   3. Medical Management Covid negative CMP: Unremarkable CBC: Hgb 10.9 EtOH: <10 UDS: Negative TSH: 1.276 A1C: 5.8% Lipids: Unremarkable     #Prediabetes #History of dyslipidemia - Initiated metformin 500 mg once daily for prediabetes and prevention of antipsychotic induced metabolic syndrome - Continue home atorvastatin 10 mg daily  #Asthma - Continue home Dulera 2 puffs twice daily  Discharge Planning:              -- Social work and case management to assist with discharge planning and identification of hospital follow-up needs prior to discharge.              -- Estimated LOS: 5-7 days             -- Discharge Concerns: Need to establish a safety plan; Medication compliance and effectiveness             --  Discharge Goals: Return home with outpatient referrals for mental health follow-up including medication management/psychotherapy   Rosezetta Schlatter, MD 08/16/2021, 2:29 PM

## 2021-08-16 NOTE — Progress Notes (Signed)
Progress note  Pt found in bed; compliant with medication administration. Pt had c/o back pain later in the day. Pt presented this morning with slightly pressured speech compared to the yesterday. Pt is pleasant. Pt has been more reclusive today as well. Pt denies si/hi/ah/vh and verbally agrees to approach staff if these become apparent or before harming themselves/others while at [place].  A: Pt provided support and encouragement. Pt given medication per protocol and standing orders. Q61m safety checks implemented and continued.  R: Pt safe on the unit. Will continue to monitor.

## 2021-08-16 NOTE — Progress Notes (Signed)
The patient's positive event for the day is that she showered and will be getting discharged on Thursday. Her goal for tomorrow is to prepare for discharge.

## 2021-08-16 NOTE — Progress Notes (Signed)
Pt's echo rescheduled for 1000 on 12/21 at [facility].

## 2021-08-16 NOTE — Plan of Care (Signed)
  Problem: Education: Goal: Verbalization of understanding the information provided will improve Outcome: Progressing   Problem: Activity: Goal: Interest or engagement in activities will improve Outcome: Progressing Goal: Sleeping patterns will improve Outcome: Progressing   

## 2021-08-16 NOTE — Progress Notes (Signed)
°   08/16/21 0500  Sleep  Number of Hours 7

## 2021-08-16 NOTE — Group Note (Signed)
Recreation Therapy Group Note   Group Topic:Team Building  Group Date: 08/16/2021 Start Time: 5379 End Time: 1040 Facilitators: Victorino Sparrow, LRT,CTRS Location: [address] Day[address]   Goal Area(s) Addresses:  Patient will effectively work with peer towards shared goal.  Patient will identify skills used to make activity successful.  Patient will identify how skills used during activity can be applied to reach post d/c goals.   Group Description: [employer]. In teams of 5-6, patients were given 15 craft pipe cleaners. Using the materials provided, patients were instructed to compete again the opposing team(s) to build the tallest free-standing structure from floor level. The activity was timed; difficulty increased by Probation officer as Pharmacist, hospital continued.  Systematically resources were removed with additional directions for example, placing one arm behind their back, working in silence, and shape stipulations. LRT facilitated post-activity discussion reviewing team processes and necessary communication skills involved in completion. Patients were encouraged to reflect how the skills utilized, or not utilized, in this activity can be incorporated to positively impact support systems post discharge.   Affect/Mood: Appropriate   Participation Level: Engaged   Participation Quality: Independent   Behavior: Appropriate   Speech/Thought Process: Focused   Insight: Good   Judgement: Good   Modes of Intervention: Team-building   Patient Response to Interventions:  Engaged   Education Outcome:  Acknowledges education and In group clarification offered    Clinical Observations/Individualized Feedback: Pt was attentive and worked well with peers.  Pt took turns being the leader of the group with peers.  Pt was focused and appropriate.  Pt expressed concentration was used to complete the activity.  Pt explained how everyone in the group and in support systems have a role  to play.  Pt also stated it was good for everyone to know each others role and what they do.  Pt explained this would help the team to continue on if someone left the group.     Plan: Continue to engage patient in RT group sessions 2-3x/week.   Victorino Sparrow, Glennis Brink 08/16/2021 12:49 PM

## 2021-08-17 ENCOUNTER — Encounter (HOSPITAL_COMMUNITY): Payer: Self-pay

## 2021-08-17 ENCOUNTER — Encounter (HOSPITAL_COMMUNITY): Payer: Self-pay | Admitting: *Deleted

## 2021-08-17 ENCOUNTER — Other Ambulatory Visit (HOSPITAL_COMMUNITY): Payer: Medicare Other

## 2021-08-17 DIAGNOSIS — F250 Schizoaffective disorder, bipolar type: Secondary | ICD-10-CM | POA: Diagnosis not present

## 2021-08-17 MED ORDER — ALBUTEROL SULFATE HFA 108 (90 BASE) MCG/ACT IN AERS
1.0000 | INHALATION_SPRAY | RESPIRATORY_TRACT | Status: DC
  Administered 2021-08-17 – 2021-08-18 (×5): 1 via RESPIRATORY_TRACT
  Administered 2021-08-19 (×2): 2 via RESPIRATORY_TRACT
  Filled 2021-08-17 (×2): qty 6.7

## 2021-08-17 NOTE — Progress Notes (Signed)
Pt continues to be disorganized at times, but pleasant    08/17/21 2200  Psych Admission Type (Psych Patients Only)  Admission Status Voluntary  Psychosocial Assessment  Patient Complaints Worrying  Eye Contact Fair  Facial Expression Animated;Anxious  Affect Anxious;Appropriate to circumstance  Speech Logical/coherent;Pressured  Interaction Assertive  Motor Activity Slow  Appearance/Hygiene In scrubs  Behavior Characteristics Cooperative  Mood Preoccupied;Pleasant  Thought Process  Coherency Circumstantial  Content WDL  Delusions WDL  Perception WDL  Hallucination None reported or observed  Judgment WDL  Confusion None  Danger to Self  Current suicidal ideation? Denies  Danger to Others  Danger to Others None reported or observed

## 2021-08-17 NOTE — [facility] Group Notes (Signed)
Adult Psychoeducational Group Note  Date:  08/17/2021 Time:  9:26 AM  Group Topic/Focus:  Goals Group:   The focus of this group is to help patients establish daily goals to achieve during treatment and discuss how the patient can incorporate goal setting into their daily lives to aide in recovery.  Participation Level:  Did Not Attend   Dub Mikes 08/17/2021, 9:26 AM

## 2021-08-17 NOTE — Group Note (Signed)
Recreation Therapy Group Note   Group Topic:Coping Skills  Group Date: 08/17/2021 Start Time: 1003 End Time: 1037 Facilitators: Victorino Sparrow, LRT,CTRS Location: [address] Dayroom  Goal Area(s) Addresses:  Patients will identify positive coping skills. Patients will identify benefits of using coping skills post d/c.  Group Description:  Mindmap.  Patients were given an outline of a mind map.  LRT and patients filled in the first 8 boxes with situations in which coping skills would be needed (communication, work, family, anxiety, depression, substance abuse, school and support systems).  Patients were then given 15 minutes to come up with at least three coping skills for each situation identified.  After which, the group would come back together and LRT would write the coping skills on the board.  Patients could then fill in any blank spaces on their sheets.   Affect/Mood: Appropriate   Participation Level: Engaged   Participation Quality: Independent   Behavior: Appropriate   Speech/Thought Process: Focused   Insight: Good   Judgement: Good   Modes of Intervention: Worksheet   Patient Response to Interventions:  Engaged   Education Outcome:  Acknowledges education and In group clarification offered    Clinical Observations/Individualized Feedback: Pt was attentive and engaged during group.  Pt appeared to be really into the topic of group.  Pt identified some coping skills as open/closed ended questions, job Leisure centre manager, Designer, fashion/clothing, [employer], support systems, Tree surgeon, healthcare provider, community resources, Research officer, trade union, counselors, first responders and workshops.    Plan: Continue to engage patient in RT group sessions 2-3x/week.   Victorino Sparrow, LRT,CTRS 08/17/2021 12:00 PM

## 2021-08-17 NOTE — BH IP Treatment Plan (Signed)
Interdisciplinary Treatment and Diagnostic Plan Update  08/17/2021 Time of Session: 10:00am  Atonya Templer MRN: [id number]  Principal Diagnosis: Schizoaffective disorder, bipolar type [facility])  Secondary Diagnoses: Principal Problem:   Schizoaffective disorder, bipolar type ([place]) Active Problems:   Prediabetes   Essential hypertension   Current Medications:  Current Facility-Administered Medications  Medication Dose Route Frequency Provider Last Rate Last Admin   albuterol (VENTOLIN HFA) 108 (90 Base) MCG/ACT inhaler 1-2 puff  1-2 puff Inhalation Q4H Massengill, Nathan, MD       ascorbic acid (VITAMIN C) tablet 1,000 mg  1,000 mg Oral Daily Suella Broad, FNP   1,000 mg at 08/17/21 1006   atorvastatin (LIPITOR) tablet 10 mg  10 mg Oral Daily Suella Broad, FNP   10 mg at 08/17/21 1006   divalproex (DEPAKOTE ER) 24 hr tablet 1,000 mg  1,000 mg Oral QHS Rosezetta Schlatter, MD   1,000 mg at 08/16/21 2105   docusate sodium (COLACE) capsule 100 mg  100 mg Oral Daily Rosezetta Schlatter, MD   100 mg at 08/17/21 1006   hydrOXYzine (ATARAX) tablet 25 mg  25 mg Oral TID Suella Broad, FNP   25 mg at 08/17/21 1306   hydrOXYzine (ATARAX) tablet 50 mg  50 mg Oral QHS PRN Rosezetta Schlatter, MD   50 mg at 08/15/21 2045   ibuprofen (ADVIL) tablet 400 mg  400 mg Oral Q6H PRN Suella Broad, FNP   400 mg at 08/16/21 1139   magnesium hydroxide (MILK OF MAGNESIA) suspension 30 mL  30 mL Oral Daily PRN Suella Broad, FNP       melatonin tablet 3 mg  3 mg Oral QHS Rosezetta Schlatter, MD   3 mg at 08/16/21 2105   metFORMIN (GLUCOPHAGE) tablet 500 mg  500 mg Oral Q breakfast Rosezetta Schlatter, MD   500 mg at 08/17/21 1006   mometasone-formoterol (DULERA) 200-5 MCG/ACT inhaler 2 puff  2 puff Inhalation BID Suella Broad, FNP   2 puff at 08/17/21 1007   OLANZapine (ZYPREXA) tablet 5 mg  5 mg Oral QHS Rosezetta Schlatter, MD   5 mg at 08/16/21 2105   simethicone (MYLICON) chewable  tablet 80 mg  80 mg Oral QID PRN Rosezetta Schlatter, MD       PTA Medications: Medications Prior to Admission  Medication Sig Dispense Refill Last Dose   Ascorbic Acid (VITAMIN C WITH ROSE HIPS) 500 MG tablet Take 1,000 mg by mouth daily.      atorvastatin (LIPITOR) 10 MG tablet Take 10 mg by mouth daily.      budesonide-formoterol (SYMBICORT) 160-4.5 MCG/ACT inhaler Inhale 2 puffs into the lungs 2 (two) times daily. For Shortness of breath 1 Inhaler 0    hydrOXYzine (VISTARIL) 25 MG capsule Take 25 mg by mouth 3 (three) times daily.      ibuprofen (ADVIL) 200 MG tablet Take 400 mg by mouth every 6 (six) hours as needed for moderate pain or headache.      paliperidone (INVEGA SUSTENNA) 234 MG/1.5ML SUSY injection Inject 234 mg into the muscle once for 1 dose. (Patient taking differently: Inject 234 mg into the muscle every 21 ( twenty-one) days.) 1.5 mL 1    Pseudoephedrine-APAP-DM (DAYQUIL PO) Take 1 tablet by mouth at bedtime.      traZODone (DESYREL) 100 MG tablet Take 100 mg by mouth at bedtime as needed for sleep.       Patient Stressors: Financial difficulties   Medication change or noncompliance  Patient Strengths: Ability for insight  Average or above average intelligence  Capable of independent living  Supportive family/friends   Treatment Modalities: Medication Management, Group therapy, Case management,  1 to 1 session with clinician, Psychoeducation, Recreational therapy.   Physician Treatment Plan for Primary Diagnosis: Schizoaffective disorder, bipolar type ([place]) Long Term Goal(s): Improvement in symptoms so as ready for discharge   Short Term Goals: Ability to identify changes in lifestyle to reduce recurrence of condition will improve Ability to verbalize feelings will improve Ability to demonstrate self-control will improve Ability to maintain clinical measurements within normal limits will improve Compliance with prescribed medications will improve Ability to  identify and develop effective coping behaviors will improve  Medication Management: Evaluate patient's response, side effects, and tolerance of medication regimen.  Therapeutic Interventions: 1 to 1 sessions, Unit Group sessions and Medication administration.  Evaluation of Outcomes: Progressing  Physician Treatment Plan for Secondary Diagnosis: Principal Problem:   Schizoaffective disorder, bipolar type ([place]) Active Problems:   Prediabetes   Essential hypertension  Long Term Goal(s): Improvement in symptoms so as ready for discharge   Short Term Goals: Ability to identify changes in lifestyle to reduce recurrence of condition will improve Ability to verbalize feelings will improve Ability to demonstrate self-control will improve Ability to maintain clinical measurements within normal limits will improve Compliance with prescribed medications will improve Ability to identify and develop effective coping behaviors will improve     Medication Management: Evaluate patient's response, side effects, and tolerance of medication regimen.  Therapeutic Interventions: 1 to 1 sessions, Unit Group sessions and Medication administration.  Evaluation of Outcomes: Progressing   RN Treatment Plan for Primary Diagnosis: Schizoaffective disorder, bipolar type ([place]) Long Term Goal(s): Knowledge of disease and therapeutic regimen to maintain health will improve  Short Term Goals: Ability to remain free from injury will improve, Ability to participate in decision making will improve, Ability to verbalize feelings will improve, Ability to disclose and discuss suicidal ideas, and Ability to identify and develop effective coping behaviors will improve  Medication Management: RN will administer medications as ordered by provider, will assess and evaluate patient's response and provide education to patient for prescribed medication. RN will report any adverse and/or side effects to prescribing  provider.  Therapeutic Interventions: 1 on 1 counseling sessions, Psychoeducation, Medication administration, Evaluate responses to treatment, Monitor vital signs and CBGs as ordered, Perform/monitor CIWA, COWS, AIMS and Fall Risk screenings as ordered, Perform wound care treatments as ordered.  Evaluation of Outcomes: Progressing   LCSW Treatment Plan for Primary Diagnosis: Schizoaffective disorder, bipolar type ([place]) Long Term Goal(s): Safe transition to appropriate next level of care at discharge, Engage patient in therapeutic group addressing interpersonal concerns.  Short Term Goals: Engage patient in aftercare planning with referrals and resources, Increase social support, Increase emotional regulation, Facilitate acceptance of mental health diagnosis and concerns, Identify triggers associated with mental health/substance abuse issues, and Increase skills for wellness and recovery  Therapeutic Interventions: Assess for all discharge needs, 1 to 1 time with Social worker, Explore available resources and support systems, Assess for adequacy in community support network, Educate family and significant other(s) on suicide prevention, Complete Psychosocial Assessment, Interpersonal group therapy.  Evaluation of Outcomes: Progressing   Progress in Treatment: Attending groups: Yes. Participating in groups: Yes. Taking medication as prescribed: Yes. Toleration medication: Yes. Family/Significant other contact made: Yes, individual(s) contacted:  Daughter  Patient understands diagnosis: Yes. Discussing patient identified problems/goals with staff: Yes. Medical problems stabilized or resolved: Yes. Denies  suicidal/homicidal ideation: Yes. Issues/concerns per patient self-inventory: No.   New problem(s) identified: No, Describe:  None   New Short Term/Long Term Goal(s): medication stabilization, elimination of SI thoughts, development of comprehensive mental wellness plan.   Patient  Goals: "To go to [facility]"   Discharge Plan or Barriers: Patient recently admitted. CSW will continue to follow and assess for appropriate referrals and possible discharge planning.   Reason for Continuation of Hospitalization: Delusions  Mania Medication stabilization  Estimated Length of Stay: 3 to 5 days    Scribe for Treatment Team: Darleen Crocker, Latanya Presser 08/17/2021 3:21 PM

## 2021-08-17 NOTE — Progress Notes (Signed)
Patient scheduled for echocardiogram on 08/16/2021 @ 8am, appointment communicated with staff at [facility], and patient did not show at [facility].  Patient rescheduled for 08/17/2021 @ 10am at [facility].  Appointment communicated with staff at [facility] and patient did not show.  Patient rescheduled for 08/18/2021 @ 10am.  Appointment communicated with [facility] staff again.

## 2021-08-17 NOTE — Progress Notes (Addendum)
[facility] MD Progress Note  08/17/2021 12:09 AM [name]  MRN:  [id number] Subjective:  [name] is a 50 year old female with a psychiatric history of schizoaffective disorder-bipolar type and a medical history of asthma and dyslipidemia who presented voluntarily and admitted for disorganized thoughts and paranoia.  Chart Review of Past 24 hrs: The patient's chart was reviewed and nursing notes were reviewed. The patient's [name] was discussed in multidisciplinary team meeting.  Per MAR: - Patient is compliant with scheduled meds. - PRNs: Advil 400 mg x 1 Per RN notes, no documented behavioral issues and is attending group. Patient slept 7.5 hours  Patient had the following psychiatric recommendations yesterday:  - Continue Depakote ER 1000 mg nightly for mood stabilization (started 12/16; VPA level to be checked a.m. 12/20); patient reports discontinuing Depakote 2/2 weight gain, but may have decompensated without it. - Continue Zyprexa 5 mg nightly. ACT team confirmed that patient did receive LAI on 12/6, so we will begin work-up for Clozaril, as patient has failed the Invega as monotherapy (r/b/se/a to medication reviewed and she consents to med trial). Prior to initiation of medication, labs ordered: CBC with differential, fasting lipid panel, VPA level, troponin, CRP, ESR, BNP, echocardiogram; we will also assess for aims.  A1c 5.8%, EKG unremarkable, and beta hCG negative.    On Today's Assessment (08/17/2021): [name] was discussed in the multidisciplinary team. MAR was reviewed and patient was compliant with medications.   She reports she is feeling "okay" today, a bit improved now that she knows her [name] is working on placement, but anxious about the timeline. She reports difficulty sleeping overnight due to nightmares, but reports an intact appetite. She remains disorganized and tangential. She denies SI/HI/AVH, but remains paranoid about looming death. She reports chronic neck pain extending  into the left side of her head (relieved by ibuprofen) but denies all other somatic symptoms.  Patient is given results of her Clozaril work-up thus far, reassured that everything has been within normal limits, except elevated ESR and is advised that we are just waiting for the echocardiogram in order to begin therapy.  She voices understanding.   Principal Problem: Schizoaffective disorder, bipolar type ([place]) Diagnosis: Principal Problem:   Schizoaffective disorder, bipolar type ([place]) Active Problems:   Prediabetes   Essential hypertension   Past Psychiatric History: See H&P  Past Medical History:  Past Medical History:  Diagnosis Date   Anxiety    Asthma    Bipolar 1 disorder ([place])    Depression    Gallstones 08/2020   Hypertension    Insomnia, persistent    Prediabetes    Schizophrenic disorder ([place])    Seizures ([place])     Past Surgical History:  Procedure Laterality Date   BREAST LUMPECTOMY Left 07/2013   TONSILLECTOMY     TUBAL LIGATION  2002   Family History:  Family History  Problem Relation Age of Onset   Depression Mother    Gout Mother    Cancer Father        prostate   Other Father        lung issue   Alcoholism Other    Heart attack Paternal Grandfather    Heart attack Paternal Grandmother    Heart attack Maternal Grandmother    Heart attack Maternal Grandfather    Depression Son    Anxiety disorder Son    Family Psychiatric  History: See H&P Social History:  Social History   Substance and Sexual Activity  Alcohol Use  Not Currently     Social History   Substance and Sexual Activity  Drug Use No    Social History   Socioeconomic History   Marital status: Single    Spouse name: Not on file   Number of children: Not on file   Years of education: Not on file   Highest education level: Not on file  Occupational History   Not on file  Tobacco Use   Smoking status: Former    Types: Cigarettes   Smokeless tobacco: Never  Vaping Use    Vaping Use: Never used  Substance and Sexual Activity   Alcohol use: Not Currently   Drug use: No   Sexual activity: Not Currently  Other Topics Concern   Not on file  Social History Narrative   Not on file   Social Determinants of Health   Financial Resource Strain: Not on file  Food Insecurity: Food Insecurity Present   Worried About [place] in the Last Year: Sometimes true   [place] in the Last Year: Sometimes true  Transportation Needs: No Transportation Needs   Lack of Transportation (Medical): No   Lack of Transportation (Non-Medical): No  Physical Activity: Not on file  Stress: Not on file  Social Connections: Not on file   Additional Social History:         Sleep:  Reports poor quality last night  Appetite:  Good (selective)  Current Medications: Current Facility-Administered Medications  Medication Dose Route Frequency Provider Last Rate Last Admin   ascorbic acid (VITAMIN C) tablet 1,000 mg  1,000 mg Oral Daily Suella Broad, FNP   1,000 mg at 08/16/21 0747   atorvastatin (LIPITOR) tablet 10 mg  10 mg Oral Daily Suella Broad, FNP   10 mg at 08/16/21 0747   divalproex (DEPAKOTE ER) 24 hr tablet 1,000 mg  1,000 mg Oral QHS Rosezetta Schlatter, MD   1,000 mg at 08/16/21 2105   docusate sodium (COLACE) capsule 100 mg  100 mg Oral Daily Rosezetta Schlatter, MD   100 mg at 08/16/21 0747   hydrOXYzine (ATARAX) tablet 25 mg  25 mg Oral TID Suella Broad, FNP   25 mg at 08/16/21 2105   hydrOXYzine (ATARAX) tablet 50 mg  50 mg Oral QHS PRN Rosezetta Schlatter, MD   50 mg at 08/15/21 2045   ibuprofen (ADVIL) tablet 400 mg  400 mg Oral Q6H PRN Suella Broad, FNP   400 mg at 08/16/21 1139   magnesium hydroxide (MILK OF MAGNESIA) suspension 30 mL  30 mL Oral Daily PRN Suella Broad, FNP       melatonin tablet 3 mg  3 mg Oral QHS Rosezetta Schlatter, MD   3 mg at 08/16/21 2105   metFORMIN (GLUCOPHAGE) tablet 500 mg  500 mg Oral  Q breakfast Rosezetta Schlatter, MD   500 mg at 08/16/21 0747   mometasone-formoterol (DULERA) 200-5 MCG/ACT inhaler 2 puff  2 puff Inhalation BID Suella Broad, FNP   2 puff at 08/16/21 2145   OLANZapine (ZYPREXA) tablet 5 mg  5 mg Oral QHS Rosezetta Schlatter, MD   5 mg at 08/16/21 2105   simethicone (MYLICON) chewable tablet 80 mg  80 mg Oral QID PRN Rosezetta Schlatter, MD        Lab Results:  Results for orders placed or performed during the hospital encounter of 08/11/21 (from the past 48 hour(s))  C-reactive protein     Status: None  Collection Time: 08/16/21  6:25 AM  Result Value Ref Range   CRP 0.9 <1.0 mg/dL    Comment: Performed at [facility], [place] [address], [place], [place] [address]  Sedimentation rate     Status: Abnormal   Collection Time: 08/16/21  6:25 AM  Result Value Ref Range   Sed Rate 42 (H) 0 - 22 mm/hr    Comment: Performed at [facility], [place] [address], [place], [place] [address]  Brain natriuretic peptide     Status: None   Collection Time: 08/16/21  6:25 AM  Result Value Ref Range   B Natriuretic Peptide 5.1 0.0 - 100.0 pg/mL    Comment: Performed at [facility], [place] [address] [place], [place] [address]  CBC with Differential/Platelet     Status: Abnormal   Collection Time: 08/16/21  6:25 AM  Result Value Ref Range   WBC 5.5 4.0 - 10.5 K/uL   RBC 3.93 3.87 - 5.11 MIL/uL   Hemoglobin 11.3 (L) 12.0 - 15.0 g/dL   HCT 35.2 (L) 36.0 - 46.0 %   MCV 89.6 80.0 - 100.0 fL   MCH 28.8 26.0 - 34.0 pg   MCHC 32.1 30.0 - 36.0 g/dL   RDW 13.8 11.5 - 15.5 %   Platelets 273 150 - 400 K/uL   nRBC 0.0 0.0 - 0.2 %   Neutrophils Relative % 48 %   Neutro Abs 2.7 1.7 - 7.7 K/uL   Lymphocytes Relative 39 %   Lymphs Abs 2.1 0.7 - 4.0 K/uL   Monocytes Relative 7 %   Monocytes Absolute 0.4 0.1 - 1.0 K/uL   Eosinophils Relative 6 %   Eosinophils Absolute 0.3 0.0 - 0.5 K/uL   Basophils Relative 0 %   Basophils Absolute 0.0  0.0 - 0.1 K/uL   Immature Granulocytes 0 %   Abs Immature Granulocytes 0.02 0.00 - 0.07 K/uL    Comment: Performed at [facility], [place] [address], [place],  [address]  Lipid panel     Status: Abnormal   Collection Time: 08/16/21  6:25 AM  Result Value Ref Range   Cholesterol 125 0 - 200 mg/dL   Triglycerides 54 <150 mg/dL   HDL 40 (L) >40 mg/dL   Total CHOL/HDL Ratio 3.1 RATIO   VLDL 11 0 - 40 mg/dL   LDL Cholesterol 74 0 - 99 mg/dL    Comment:        Total Cholesterol/HDL:CHD Risk Coronary Heart Disease Risk Table                     Men   Women  1/2 Average Risk   3.4   3.3  Average Risk       5.0   4.4  2 X Average Risk   9.6   7.1  3 X Average Risk  23.4   11.0        Use the calculated Patient Ratio above and the CHD Risk Table to determine the patient's CHD Risk.        ATP III CLASSIFICATION (LDL):  <100     mg/dL   Optimal  100-129  mg/dL   Near or Above                    Optimal  130-159  mg/dL   Borderline  160-189  mg/dL   High  >190     mg/dL   Very  High Performed at [facility], [place] [address], [place], [place] [address]   Valproic acid level     Status: None   Collection Time: 08/16/21  6:25 AM  Result Value Ref Range   Valproic Acid Lvl 77 50.0 - 100.0 ug/mL    Comment: Performed at [facility], [place] [address] [place], [place] [address]  Troponin I (High Sensitivity)     Status: None   Collection Time: 08/16/21  6:25 AM  Result Value Ref Range   Troponin I (High Sensitivity) 2 <18 ng/L    Comment: (NOTE) Elevated high sensitivity troponin I (hsTnI) values and significant  changes across serial measurements may suggest ACS but many other  chronic and acute conditions are known to elevate hsTnI results.  Refer to the Links section for chest pain algorithms and additional  guidance. Performed at [facility], [place] [address] [place], [place] [address]     Blood  Alcohol level:  Lab Results  Component Value Date   [facility] <10 08/11/2021   ETH <10 01/75/1025    Metabolic Disorder Labs: Lab Results  Component Value Date   HGBA1C 5.8 (H) 08/12/2021   MPG 119.76 08/12/2021   MPG 125.5 04/27/2021   Lab Results  Component Value Date   PROLACTIN 74.6 (H) 09/28/2019   PROLACTIN 4.2 (L) 12/19/2018   Lab Results  Component Value Date   CHOL 125 08/16/2021   TRIG 54 08/16/2021   HDL 40 (L) 08/16/2021   CHOLHDL 3.1 08/16/2021   VLDL 11 08/16/2021   LDLCALC 74 08/16/2021   LDLCALC 95 08/12/2021    Physical Findings: AIMS: Facial and Oral Movements Muscles of Facial Expression: None, normal Lips and Perioral Area: None, normal Jaw: None, normal Tongue: None, normal,Extremity Movements Upper (arms, wrists, hands, fingers): None, normal Lower (legs, knees, ankles, toes): None, normal, Trunk Movements Neck, shoulders, hips: None, normal, Overall Severity Severity of abnormal movements (highest score from questions above): None, normal Incapacitation due to abnormal movements: None, normal [name] awareness of abnormal movements (rate only patient's report): No Awareness, Dental Status Current problems with teeth and/or dentures?: No Does patient usually wear dentures?: No    Musculoskeletal: Strength & Muscle Tone: within normal limits Gait & Station: normal Patient leans: N/A  Psychiatric Specialty Exam:  Presentation  General Appearance: Casual; Fairly Groomed  Eye Contact:Good  Speech:Slurred; Pressured (even less pressured than previous day)  Speech Volume:Normal  Handedness:Right   Mood and Affect  Mood:Euthymic; Anxious (Less anxious, more euthymic)  Affect:Congruent; Constricted   Thought Process  Thought Processes:Goal Directed  Descriptions of Associations:Tangential  Orientation:Partial  Thought Content:Rumination; Paranoid Ideation; Tangential; Logical; Illogical (Some moments of logical thought, but  mostly disorganized with illogical thought, paranoia, rumnination, and tangents.)  History of Schizophrenia/Schizoaffective disorder:Yes  Duration of Psychotic Symptoms:Greater than six months  Hallucinations:Hallucinations: -- (Denies today)   Ideas of Reference:Paranoia  Suicidal Thoughts:Suicidal Thoughts: No   Homicidal Thoughts:Homicidal Thoughts: No    Sensorium  Memory:Immediate Fair; Recent Fair  Judgment:Fair (Compliant with meds, realizes her limitations in living alone so asked for help)  Insight:Fair (Improving; understands need for medications and that Invega alone isn't sufficient)   Community education officer  Concentration:Poor, improving  Attention Span:Poor, improving  [place]   Psychomotor Activity  Psychomotor Activity:Psychomotor Activity: Normal    Assets  Assets:Housing; Leisure Time   Sleep  Sleep: Number of Hours of Sleep: 7.5     Physical Exam: Physical Exam  Vitals and nursing note reviewed.  Constitutional:      General: She is not in acute distress.    Appearance: Normal appearance. She is not toxic-appearing.  HENT:     Head: Normocephalic and atraumatic.     Mouth/Throat:     Mouth: Mucous membranes are moist.     Pharynx: Oropharynx is clear.  Pulmonary:     Effort: Pulmonary effort is normal.  Skin:    General: Skin is warm and dry.  Neurological:     General: No focal deficit present.     Mental Status: She is alert and oriented to person, place, and time.     Motor: No weakness.   Review of Systems  Constitutional:  Negative for chills, fever and malaise/fatigue.  Respiratory:  Negative for shortness of breath.   Cardiovascular:  Negative for chest pain.  Gastrointestinal:  Negative for diarrhea, nausea and vomiting.  Genitourinary: Negative.   Musculoskeletal:  Positive for neck pain.  Neurological:  Positive for headaches. Negative for dizziness and weakness.   Psychiatric/Behavioral:  Negative for depression, hallucinations and suicidal ideas. The patient is nervous/anxious.   All other systems reviewed and are negative. Blood pressure 107/77, pulse 84, temperature 97.8 F (36.6 C), temperature source Oral, resp. rate 16, height '5\' 8"'  (1.727 m), weight 117.5 kg, SpO2 100 %. Body mass index is 39.38 kg/m.   ASSESSMENT: Principal Problem:   Schizoaffective disorder, bipolar type ([place]) Active Problems:   Prediabetes   Essential hypertension  Treatment Plan Summary: Daily contact with patient to assess and evaluate symptoms and progress in treatment and Medication management     Safety and Monitoring: voluntarily admission to inpatient psychiatric unit for safety, stabilization and treatment Daily contact with patient to assess and evaluate symptoms and progress in treatment [name] [name] to be discussed in multi-disciplinary team meeting Observation Level : q15 minute checks Vital signs: q12 hours Precautions: suicide, elopement, and assault   2.   Psychiatric diagnosis #Schizoaffective disorder-bipolar type #PTSD - Continue Depakote ER 1000 mg nightly for mood stabilization (started 12/16; VPA level 77 on 08-16-2021 - Continue Zyprexa 5 mg nightly.  ACT team confirmed that patient did receive LAI on 12/6, so we will continue work-up for Clozaril, as patient has failed the Invega as monotherapy. Labs ordered: echocardiogram pending (scheduled for 12/22 at 10 AM); CBC with differential- Hgb 11.3, fasting lipid panel- HDL 40, VPA level- 77, troponin- 2, CRP- 0.9, ESR- 42, BNP- 5.1, A1c 5.8%, EKG unremarkable, and beta hCG negative.   3. Medical Management Covid negative CMP: Unremarkable CBC: Hgb 10.9 EtOH: <10 UDS: Negative TSH: 1.276 A1C: 5.8% Lipids: Unremarkable    #Prediabetes #History of dyslipidemia - Continue metformin 500 mg once daily for prediabetes and prevention of antipsychotic induced metabolic syndrome - Continue  home atorvastatin 10 mg daily  #Asthma - Continue home Dulera 2 puffs twice daily  Discharge Planning:              -- Social work and [name] management to assist with discharge planning and identification of hospital follow-up needs prior to discharge.              -- Estimated LOS: 10-14 days; pending labs for initiation of clozaril and additional admission time required to titrate medication.             -- Discharge Concerns: Need to establish a safety plan; Medication compliance and effectiveness             -- Discharge  Goals: Return home with outpatient referrals for mental health follow-up including medication management/psychotherapy   Rosezetta Schlatter, MD 08/17/2021, 12:09 AM  Total Time Spent in Direct Patient Care:  I personally spent 30 minutes on the unit in direct patient care. The direct patient care time included face-to-face time with the patient, reviewing the patient's chart, communicating with other professionals, and coordinating care. Greater than 50% of this time was spent in counseling or coordinating care with the patient regarding goals of hospitalization, psycho-education, and discharge planning needs.  I have independently evaluated the patient during a face-to-face assessment on 08/17/21. I reviewed the patient's chart, and I participated in key portions of the service. I discussed the [name] with the [employer], and I agree with the assessment and plan of care as documented in the House Officer's note, as addended by me or notated below:  I directly edited the note, as above. I will also order inhaler for asthma.   Janine Limbo, MD Psychiatrist

## 2021-08-17 NOTE — [facility] Group Notes (Signed)
The focus of this group is to help patients review their daily goal of treatment and discuss progress on daily workbooks.  Pt was present for tonights wrap up group. Pt was able to share that she was able to talk with the treatment team today along with other staff members. Pt shared that her family members are supportive and the plan is if for her to live in group home.  Pt was able to share her strengths and talents with the group tonight. Overall day was pretty good.

## 2021-08-17 NOTE — Group Note (Signed)
[facility] LCSW Group Therapy    Type of Therapy and Topic:  Group Therapy:  Strengths Exploration   Participation Level: Active  Description of Group: This group allows individuals to explore their strengths, learn to use strengths in new ways to improve well-being. Strengths-based interventions involve identifying strengths, understanding how they are used, and learning new ways to apply them. Individuals will identify their strengths, and then explore their roles in different areas of life (relationships, professional life, and personal fulfillment). Individuals will think about ways in which they currently use their strengths, along with new ways they could begin using them.    Therapeutic Goals Patient will verbalize two of their strengths Patient will identify how their strengths are currently used Patient will identify two new ways to apply their strengths  Patients will create a plan to apply their strengths in their daily lives     Summary of Patient Progress:  Pt came to group and participated. Pt monopolized group and often had to be redirected. Pt states her strengths are helping others, being a voice of change, ambitious, and being trustworthy and kind.        Therapeutic Modalities Cognitive Behavioral Therapy Motivational Interviewing

## 2021-08-18 ENCOUNTER — Ambulatory Visit (HOSPITAL_COMMUNITY): Payer: Medicare Other | Attending: Student

## 2021-08-18 DIAGNOSIS — Z79899 Other long term (current) drug therapy: Secondary | ICD-10-CM | POA: Insufficient documentation

## 2021-08-18 DIAGNOSIS — R0600 Dyspnea, unspecified: Secondary | ICD-10-CM | POA: Insufficient documentation

## 2021-08-18 DIAGNOSIS — R079 Chest pain, unspecified: Secondary | ICD-10-CM | POA: Diagnosis not present

## 2021-08-18 DIAGNOSIS — F250 Schizoaffective disorder, bipolar type: Secondary | ICD-10-CM | POA: Diagnosis not present

## 2021-08-18 DIAGNOSIS — R569 Unspecified convulsions: Secondary | ICD-10-CM | POA: Insufficient documentation

## 2021-08-18 DIAGNOSIS — E785 Hyperlipidemia, unspecified: Secondary | ICD-10-CM | POA: Insufficient documentation

## 2021-08-18 DIAGNOSIS — I10 Essential (primary) hypertension: Secondary | ICD-10-CM | POA: Insufficient documentation

## 2021-08-18 LAB — ECHOCARDIOGRAM COMPLETE
Area-P 1/2: 4.89 cm2
Calc EF: 68.2 %
Height: 68 in
S' Lateral: 2.6 cm
Single Plane A2C EF: 73.3 %
Single Plane A4C EF: 62 %
Weight: 4144 oz

## 2021-08-18 MED ORDER — CLOZAPINE 25 MG PO TABS
12.5000 mg | ORAL_TABLET | Freq: Two times a day (BID) | ORAL | Status: AC
  Administered 2021-08-18 – 2021-08-20 (×4): 12.5 mg via ORAL
  Filled 2021-08-18 (×4): qty 1

## 2021-08-18 MED ORDER — CLOZAPINE 25 MG PO TABS
25.0000 mg | ORAL_TABLET | Freq: Two times a day (BID) | ORAL | Status: AC
  Administered 2021-08-20 – 2021-08-22 (×4): 25 mg via ORAL
  Filled 2021-08-18 (×4): qty 1

## 2021-08-18 NOTE — Progress Notes (Signed)
°   08/18/21 2100  Psych Admission Type (Psych Patients Only)  Admission Status Voluntary  Psychosocial Assessment  Patient Complaints Worrying;Suspiciousness  Eye Contact Fair  Facial Expression Animated;Anxious  Affect Anxious;Appropriate to circumstance  Speech Logical/coherent;Pressured  Interaction Assertive  Motor Activity Slow  Appearance/Hygiene In scrubs  Behavior Characteristics Cooperative  Mood Preoccupied;Pleasant  Thought Process  Coherency Circumstantial  Content WDL  Delusions WDL  Perception WDL  Hallucination None reported or observed  Judgment WDL  Confusion None  Danger to Self  Current suicidal ideation? Denies  Danger to Others  Danger to Others None reported or observed

## 2021-08-18 NOTE — Progress Notes (Signed)
Patient did not attend morning goals group.  

## 2021-08-18 NOTE — Progress Notes (Signed)
The patient rated her day as a 9 out of 10 since she spoke with her father. In addition,k she left a message with her mom. Her goal for tomorrow is to rest her mind and work on her discharge planning.

## 2021-08-18 NOTE — Progress Notes (Signed)
°  Echocardiogram 2D Echocardiogram has been performed.  [name] 08/18/2021, 10:57 AM

## 2021-08-18 NOTE — Group Note (Signed)
Recreation Therapy Group Note   Group Topic:Other  Group Date: 08/18/2021 Start Time: 1000 End Time: 1050 Facilitators: Victorino Sparrow, LRT,CTRS Location: [address] Dayroom   Goal Area(s) Addresses:  Patient will participate in the creative process to complete all crafts.  Patient will interact pro-socially with staff and peers. Patient will share traditions, activities, and positive feelings produced during the holidays.  Patient will follow directions on the 1st prompt.  Group Description: LRT facilitated a therapeutic art activity to encourage self-expression and creativity in recognition of the approaching holidays. Writer explained to patients how to create wreaths using paper plates.  Patients were also provided Christmas lights that could be used to decorate the wreaths. Print outs of wreaths were also provided if patients simply wanted to color them and glue them to the plates.   Patients were encouraged to engage in leisure discussions about festive activities and hobbies they like to participate in during the winter season.   Affect/Mood: N/A   Participation Level: Did not attend    Clinical Observations/Individualized Feedback: Pt wasn't on unit at the time of group.    Plan: Continue to engage patient in RT group sessions 2-3x/week.   Victorino Sparrow, Glennis Brink 08/18/2021 12:38 PM

## 2021-08-18 NOTE — Progress Notes (Signed)
DAR NOTE: Patient presents with a calm affect and mood.  Denies suicidal thoughts, pain, auditory and visual hallucinations.  Rates depression at 3, hopelessness at 3, and anxiety at 3.  Maintained on routine safety checks.  Medications given as prescribed.  Support and encouragement offered as needed.  Attended group and participated.  Patient observed socializing with peers in the dayroom.  Offered no complaint.  Patient is safe on and off the unit.

## 2021-08-18 NOTE — Progress Notes (Signed)
[facility] MD Progress Note  08/18/2021 12:14 PM [name]  MRN:  [id number] Subjective:  [name] is a 50 year old female with a psychiatric history of schizoaffective disorder-bipolar type and a medical history of asthma and dyslipidemia who presented voluntarily and admitted for disorganized thoughts and paranoia.  Chart Review of Past 24 hrs: The patient's chart was reviewed and nursing notes were reviewed. The patient's case was discussed in multidisciplinary team meeting.  Per MAR: - Patient is compliant with scheduled meds. - PRNs: Hydroxyzine 50 mg x 1 Per RN notes, no documented behavioral issues and is attending group. Patient slept 9 hours  Patient had the following psychiatric recommendations yesterday:  - Continue Depakote ER 1000 mg nightly for mood stabilization (started 12/16; VPA level 77 on 08-16-2021 - Continue Zyprexa 5 mg nightly.  ACT team confirmed that patient did receive LAI on 12/6, so we will continue work-up for Clozaril, as patient has failed the Invega as monotherapy. Labs ordered: echocardiogram pending (scheduled for 12/22 at 10 AM); CBC with differential- Hgb 11.3, fasting lipid panel- HDL 40, VPA level- 77, troponin- 2, CRP- 0.9, ESR- 42, BNP- 5.1, A1c 5.8%, EKG unremarkable, and beta hCG negative.      On Today's Assessment (08/18/2021): Case was discussed in the multidisciplinary team. MAR was reviewed and patient was compliant with medications.   She reports she is feeling "okay" today, slept fairly well due to "weird" dreams, but has an intact appetite. She remains disorganized and tangential, but with moderate improvement; she is more interruptible. She denies SI/HI/AVH, but remains paranoid about looming death. She reports chronic neck pain extending into the left side of her head (relieved by ibuprofen) but denies all other somatic symptoms.  Patient is informed that she will have her echocardiogram obtained today and we will begin Clozaril therapy.  She  voices understanding.  Review of symptoms, specific for clozapine: Malaise:Denies Chest pain: Denies Shortness of breath: Denies Exertional capacity: Normal Tachycardia: Denies subjectively, none reported objectively Cough: Denies Fever: Denies Sedation: Denies Orthostatic hypotension (dizziness with standing): Denies Hypersalivation: some baseline drooling, but not excessive Constipation: Denies Symptoms of GERD: Denies Nausea: Denies Nocturnal enuresis: Denies   Principal Problem: Schizoaffective disorder, bipolar type ([place]) Diagnosis: Principal Problem:   Schizoaffective disorder, bipolar type ([place]) Active Problems:   Prediabetes   Essential hypertension   Past Psychiatric History: See H&P  Past Medical History:  Past Medical History:  Diagnosis Date   Anxiety    Asthma    Bipolar 1 disorder ([place])    Depression    Gallstones 08/2020   Hypertension    Insomnia, persistent    Prediabetes    Schizophrenic disorder ([place])    Seizures ([place])     Past Surgical History:  Procedure Laterality Date   BREAST LUMPECTOMY Left 07/2013   TONSILLECTOMY     TUBAL LIGATION  2002   Family History:  Family History  Problem Relation Age of Onset   Depression Mother    Gout Mother    Cancer Father        prostate   Other Father        lung issue   Alcoholism Other    Heart attack Paternal Grandfather    Heart attack Paternal Grandmother    Heart attack Maternal Grandmother    Heart attack Maternal Grandfather    Depression Son    Anxiety disorder Son    Family Psychiatric  History: See H&P Social History:  Social History   Substance and  Sexual Activity  Alcohol Use Not Currently     Social History   Substance and Sexual Activity  Drug Use No    Social History   Socioeconomic History   Marital status: Single    Spouse name: Not on file   Number of children: Not on file   Years of education: Not on file   Highest education level: Not on file   Occupational History   Not on file  Tobacco Use   Smoking status: Former    Types: Cigarettes   Smokeless tobacco: Never  Vaping Use   Vaping Use: Never used  Substance and Sexual Activity   Alcohol use: Not Currently   Drug use: No   Sexual activity: Not Currently  Other Topics Concern   Not on file  Social History Narrative   Not on file   Social Determinants of Health   Financial Resource Strain: Not on file  Food Insecurity: Food Insecurity Present   Worried About [place] in the Last Year: Sometimes true   [place] in the Last Year: Sometimes true  Transportation Needs: No Transportation Needs   Lack of Transportation (Medical): No   Lack of Transportation (Non-Medical): No  Physical Activity: Not on file  Stress: Not on file  Social Connections: Not on file   Additional Social History:         Sleep:  Reports fair quality last night but with odd dreams  Appetite:  Good  Current Medications: Current Facility-Administered Medications  Medication Dose Route Frequency Provider Last Rate Last Admin   albuterol (VENTOLIN HFA) 108 (90 Base) MCG/ACT inhaler 1-2 puff  1-2 puff Inhalation Q4H Massengill, Nathan, MD   1 puff at 08/18/21 1205   ascorbic acid (VITAMIN C) tablet 1,000 mg  1,000 mg Oral Daily Suella Broad, FNP   1,000 mg at 08/18/21 0802   atorvastatin (LIPITOR) tablet 10 mg  10 mg Oral Daily Suella Broad, FNP   10 mg at 08/18/21 0802   divalproex (DEPAKOTE ER) 24 hr tablet 1,000 mg  1,000 mg Oral QHS Rosezetta Schlatter, MD   1,000 mg at 08/17/21 2056   docusate sodium (COLACE) capsule 100 mg  100 mg Oral Daily Rosezetta Schlatter, MD   100 mg at 08/18/21 0802   hydrOXYzine (ATARAX) tablet 25 mg  25 mg Oral TID Suella Broad, FNP   25 mg at 08/18/21 1205   hydrOXYzine (ATARAX) tablet 50 mg  50 mg Oral QHS PRN Rosezetta Schlatter, MD   50 mg at 08/17/21 2056   ibuprofen (ADVIL) tablet 400 mg  400 mg Oral Q6H PRN  Suella Broad, FNP   400 mg at 08/16/21 1139   magnesium hydroxide (MILK OF MAGNESIA) suspension 30 mL  30 mL Oral Daily PRN Suella Broad, FNP       melatonin tablet 3 mg  3 mg Oral QHS Rosezetta Schlatter, MD   3 mg at 08/17/21 2056   metFORMIN (GLUCOPHAGE) tablet 500 mg  500 mg Oral Q breakfast Rosezetta Schlatter, MD   500 mg at 08/18/21 0802   mometasone-formoterol (DULERA) 200-5 MCG/ACT inhaler 2 puff  2 puff Inhalation BID Suella Broad, FNP   2 puff at 08/18/21 0803   OLANZapine (ZYPREXA) tablet 5 mg  5 mg Oral QHS Rosezetta Schlatter, MD   5 mg at 08/17/21 2056   simethicone (MYLICON) chewable tablet 80 mg  80 mg Oral QID PRN Rosezetta Schlatter, MD  Lab Results:  No results found for this or any previous visit (from the past 48 hour(s)).   Blood Alcohol level:  Lab Results  Component Value Date   ETH <10 08/11/2021   ETH <10 69/48/5462    Metabolic Disorder Labs: Lab Results  Component Value Date   HGBA1C 5.8 (H) 08/12/2021   MPG 119.76 08/12/2021   MPG 125.5 04/27/2021   Lab Results  Component Value Date   PROLACTIN 74.6 (H) 09/28/2019   PROLACTIN 4.2 (L) 12/19/2018   Lab Results  Component Value Date   CHOL 125 08/16/2021   TRIG 54 08/16/2021   HDL 40 (L) 08/16/2021   CHOLHDL 3.1 08/16/2021   VLDL 11 08/16/2021   LDLCALC 74 08/16/2021   LDLCALC 95 08/12/2021    Physical Findings: AIMS: Facial and Oral Movements Muscles of Facial Expression: None, normal Lips and Perioral Area: None, normal Jaw: None, normal Tongue: None, normal,Extremity Movements Upper (arms, wrists, hands, fingers): None, normal Lower (legs, knees, ankles, toes): None, normal, Trunk Movements Neck, shoulders, hips: None, normal, Overall Severity Severity of abnormal movements (highest score from questions above): None, normal Incapacitation due to abnormal movements: None, normal [name] awareness of abnormal movements (rate only patient's report): No Awareness,  Dental Status Current problems with teeth and/or dentures?: No Does patient usually wear dentures?: No    Musculoskeletal: Strength & Muscle Tone: within normal limits Gait & Station: normal Patient leans: N/A  Psychiatric Specialty Exam:  Presentation  General Appearance: Casual; Fairly Groomed; Appropriate for Environment  Eye Contact:Good  Speech:Slurred; Pressured (even less pressured than previous day)  Speech Volume:Normal  Handedness:Right   Mood and Affect  Mood:Euthymic (Less anxious, more euthymic)  Affect:Congruent; Constricted   Thought Process  Thought Processes:Disorganized  Descriptions of Associations:Tangential  Orientation:Partial  Thought Content:Rumination; Paranoid Ideation; Illogical; Logical; Tangential (Some moments of logical thought, but mostly disorganized with illogical thought, paranoia, rumnination, and tangents.)  History of Schizophrenia/Schizoaffective disorder:Yes  Duration of Psychotic Symptoms:Greater than six months  Hallucinations:Hallucinations: -- (Denies today)    Ideas of Reference:-- (None reported)  Suicidal Thoughts:Suicidal Thoughts: No    Homicidal Thoughts:Homicidal Thoughts: No     Sensorium  Memory:Immediate Good; Recent Good  Judgment:Fair  Insight:Fair (Improving; understands need for medications and that Invega alone isn't sufficient)   Community education officer  Concentration:Poor, improving  Attention Span:Poor, improving  [place]   Psychomotor Activity  Psychomotor Activity:Psychomotor Activity: Normal     Assets  Assets:Housing; Leisure Time   Sleep  Sleep: Number of Hours of Sleep: 9     Physical Exam: Physical Exam Vitals and nursing note reviewed.  Constitutional:      General: She is not in acute distress.    Appearance: Normal appearance. She is not toxic-appearing.  HENT:     Head: Normocephalic and atraumatic.      Mouth/Throat:     Mouth: Mucous membranes are moist.     Pharynx: Oropharynx is clear.  Pulmonary:     Effort: Pulmonary effort is normal.  Skin:    General: Skin is warm and dry.  Neurological:     General: No focal deficit present.     Mental Status: She is alert and oriented to person, place, and time.     Motor: No weakness.   Review of Systems  Constitutional:  Negative for chills, fever and malaise/fatigue.  Respiratory:  Negative for shortness of breath.   Cardiovascular:  Negative for chest pain.  Gastrointestinal:  Negative for  diarrhea, nausea and vomiting.  Genitourinary: Negative.   Musculoskeletal:  Positive for neck pain.  Neurological:  Positive for headaches. Negative for dizziness and weakness.  Psychiatric/Behavioral:  Negative for depression, hallucinations and suicidal ideas. The patient is nervous/anxious.   All other systems reviewed and are negative. Blood pressure (!) 136/96, pulse 97, temperature 97.8 F (36.6 C), temperature source Oral, resp. rate 16, height '5\' 8"'  (1.727 m), weight 117.5 kg, SpO2 100 %. Body mass index is 39.38 kg/m.   ASSESSMENT: Principal Problem:   Schizoaffective disorder, bipolar type ([place]) Active Problems:   Prediabetes   Essential hypertension  Treatment Plan Summary: Daily contact with patient to assess and evaluate symptoms and progress in treatment and Medication management     Safety and Monitoring: voluntarily admission to inpatient psychiatric unit for safety, stabilization and treatment Daily contact with patient to assess and evaluate symptoms and progress in treatment [name] case to be discussed in multi-disciplinary team meeting Observation Level : q15 minute checks Vital signs: q12 hours Precautions: suicide, elopement, and assault   2.   Psychiatric diagnosis #Schizoaffective disorder-bipolar type #PTSD - Continue Depakote ER 1000 mg nightly for mood stabilization (started 12/16; VPA level 77 on  08-16-2021) - Discontinue Zyprexa 5 mg nightly, due to initiation of Clozaril, as patient was unbalanced with 10 mg Zyprexa.  ACT team confirmed that patient did receive LAI on 12/6, so we will continue work-up for Clozaril, as patient has failed the Invega as monotherapy. Labs ordered: echocardiogram obtained this morning (); CBC with differential- Hgb 11.3, fasting lipid panel- HDL 40, VPA level- 77, troponin- 2, CRP- 0.9, ESR- 42, BNP- 5.1, A1c 5.8%, EKG unremarkable, and beta hCG negative.  Will continue to trend ESR weekly (next check 12/27). -- Initiate Clozaril 12.5 mg q12h this evening x 4 doses, then increase to 25 mg x 4 doses, then increase to 37.5 mg x 4 doses, before re-evaluation.   3. Medical Management Covid negative CMP: Unremarkable CBC: Hgb 10.9 EtOH: <10 UDS: Negative TSH: 1.276 A1C: 5.8% Lipids: Unremarkable    #Prediabetes #History of dyslipidemia - Continue metformin 500 mg once daily for prediabetes and prevention of antipsychotic induced metabolic syndrome - Continue home atorvastatin 10 mg daily  #Asthma - Continue home Dulera 2 puffs twice daily  Discharge Planning:              -- Social work and case management to assist with discharge planning and identification of hospital follow-up needs prior to discharge.              -- Estimated LOS: 10-14 days; pending labs for initiation of clozaril and additional admission time required to titrate medication.             -- Discharge Concerns: Need to establish a safety plan; Medication compliance and effectiveness             -- Discharge Goals: Return home with outpatient referrals for mental health follow-up including medication management/psychotherapy   Rosezetta Schlatter, MD 08/18/2021, 12:14 PM

## 2021-08-18 NOTE — Group Note (Signed)
Occupational Therapy Group Note  Group Topic:Coping Skills  Group Date: 08/18/2021 Start Time: 1400 End Time: 1435 Facilitators: Ponciano Ort, OT   Group Description: Group encouraged increased engagement and participation through discussion focused on coping through music. Group members were encouraged to create a "coping skills playlist" that consisted of 105 songs with different cited categories/topics including "a song that reminds you of a good memory" and "a song that makes you want to dance", etc. Discussion followed with patients sharing their playlists and choosing one for the group to listen and respond too.   Therapeutic Goal(s): Identify positive songs to improve coping through music Identify and share benefit of music as a coping strategy  Participation Level: Did not attend   Plan: Continue to engage patient in OT groups 2 - 3x/week.  08/18/2021  Ponciano Ort, MOT, OTR/L

## 2021-08-19 NOTE — Group Note (Signed)
Type of Therapy and Topic:  Group Therapy:  Stress Management   Participation Level:  Did Not Attend    Description of Group:  Patients in this group were introduced to the idea of stress and encouraged to discuss negative and positive ways to manage stress. Patients discussed specific stressors that they have in their life right now and the physical signs and symptoms associated with that stress.  Patient encouraged to come up with positive changes to assist with the stress upon discharge in order to prevent future hospitalizations.   They also worked as a group on developing a specific plan for several patients to deal with stressors through [place], psychoeducation and self care techniques   Therapeutic Goals:               1)  To discuss the positive and negative impacts of stress             2)  identify signs and symptoms of stress             3)  generate ideas for stress management             4)  offer mutual support to others regarding stress management             5)  Developing plans for ways to manage specific stressors upon discharge               Summary of Patient Progress:  Did not attend   Therapeutic Modalities:   Motivational Interviewing Brief Solution-Focused Therapy

## 2021-08-19 NOTE — Progress Notes (Signed)
°   08/19/21 0500  Sleep  Number of Hours 7

## 2021-08-19 NOTE — Progress Notes (Signed)
Pt visible in the dayroom much of the evening    08/19/21 2300  Psych Admission Type (Psych Patients Only)  Admission Status Voluntary  Psychosocial Assessment  Patient Complaints Worrying;Suspiciousness  Eye Contact Fair  Facial Expression Flat  Affect Appropriate to circumstance  Speech Logical/coherent  Interaction Assertive  Motor Activity Slow  Appearance/Hygiene In scrubs  Behavior Characteristics Cooperative  Mood Preoccupied;Pleasant  Thought Process  Coherency Circumstantial  Content WDL  Delusions WDL  Perception WDL  Hallucination None reported or observed  Judgment WDL  Confusion None  Danger to Self  Current suicidal ideation? Denies  Danger to Others  Danger to Others None reported or observed

## 2021-08-19 NOTE — Progress Notes (Signed)
[facility] MD Progress Note  08/19/2021 3:12 PM [name]  MRN:  [id number] Subjective:  [name] is a 50 year old female with a psychiatric history of schizoaffective disorder-bipolar type and a medical history of asthma and dyslipidemia who presented voluntarily and admitted for disorganized thoughts and paranoia.  Chart Review of Past 24 hrs: The patient's chart was reviewed and nursing notes were reviewed. The patient's case was discussed in multidisciplinary team meeting.  Per MAR: - Patient is compliant with scheduled meds. - PRNs: Hydroxyzine 50 mg x 1 Per RN notes, no documented behavioral issues and has been attending some groups. Patient slept 7 hours  Patient had the following psychiatric recommendations yesterday:   Continue Depakote ER 1000 mg nightly for mood stabilization (started 12/16; VPA level 77 on 08-16-2021) - Discontinue Zyprexa 5 mg nightly, due to initiation of Clozaril, as patient was unbalanced with 10 mg Zyprexa.  ACT team confirmed that patient did receive LAI on 12/6, so we will continue work-up for Clozaril, as patient has failed the Invega as monotherapy. Labs ordered: echocardiogram obtained this morning (pending); CBC with differential- Hgb 11.3, fasting lipid panel- HDL 40, VPA level- 77, troponin- 2, CRP- 0.9, ESR- 42, BNP- 5.1, A1c 5.8%, EKG unremarkable, and beta hCG negative.  Will continue to trend ESR weekly (next check 12/27). -- Initiate Clozaril 12.5 mg q12h this evening x 4 doses, then increase to 25 mg x 4 doses, then increase to 37.5 mg x 4 doses, before re-evaluation.  On Today's Assessment (08/19/2021): Case was discussed in the multidisciplinary team. MAR was reviewed and patient was compliant with medications.   She reports she is feeling "tired but okay" today. She slept well last night and reports stable appetite.  She denies any depression or anxiety.  She is less tangential and disorganized today. She has been tolerating her medications well and  denies any side effects to medications except mild drowsiness.  She states that she has chronic bladder leakage and  reports increased frequency of urination.  She denies any dysuria, and burning during urination. She denies SI/HI/AVH, and paranoia.  She reports generalized pain but denies any other somatic symptoms. When asked about thought broadcasting, and ideas of reference, patient gets irritable and ended the evaluation stating "I am not getting any signals, I am not a serial killer, you are asking me the same questions every day".   Review of symptoms, specific for clozapine: Malaise:Denies Chest pain: Denies Shortness of breath: Denies Exertional capacity: Normal Tachycardia: Denies subjectively, none reported objectively Cough: Denies Fever: Denies Sedation: Reports mild sedation. Orthostatic hypotension (dizziness with standing): Denies Hypersalivation: some baseline drooling, but not excessive Constipation: Denies Symptoms of GERD: Denies Nausea: Denies Nocturnal enuresis: Denies   Principal Problem: Schizoaffective disorder, bipolar type ([place]) Diagnosis: Principal Problem:   Schizoaffective disorder, bipolar type ([place]) Active Problems:   Prediabetes   Essential hypertension   Past Psychiatric History: See H&P  Past Medical History:  Past Medical History:  Diagnosis Date   Anxiety    Asthma    Bipolar 1 disorder ([place])    Depression    Gallstones 08/2020   Hypertension    Insomnia, persistent    Prediabetes    Schizophrenic disorder ([place])    Seizures ([place])     Past Surgical History:  Procedure Laterality Date   BREAST LUMPECTOMY Left 07/2013   TONSILLECTOMY     TUBAL LIGATION  2002   Family History:  Family History  Problem Relation Age of Onset  Depression Mother    Gout Mother    Cancer Father        prostate   Other Father        lung issue   Alcoholism Other    Heart attack Paternal Grandfather    Heart attack Paternal Grandmother    Heart  attack Maternal Grandmother    Heart attack Maternal Grandfather    Depression Son    Anxiety disorder Son    Family Psychiatric  History: See H&P Social History:  Social History   Substance and Sexual Activity  Alcohol Use Not Currently     Social History   Substance and Sexual Activity  Drug Use No    Social History   Socioeconomic History   Marital status: Single    Spouse name: Not on file   Number of children: Not on file   Years of education: Not on file   Highest education level: Not on file  Occupational History   Not on file  Tobacco Use   Smoking status: Former    Types: Cigarettes   Smokeless tobacco: Never  Vaping Use   Vaping Use: Never used  Substance and Sexual Activity   Alcohol use: Not Currently   Drug use: No   Sexual activity: Not Currently  Other Topics Concern   Not on file  Social History Narrative   Not on file   Social Determinants of Health   Financial Resource Strain: Not on file  Food Insecurity: Food Insecurity Present   Worried About [place] in the Last Year: Sometimes true   New Home in the Last Year: Sometimes true  Transportation Needs: No Transportation Needs   Lack of Transportation (Medical): No   Lack of Transportation (Non-Medical): No  Physical Activity: Not on file  Stress: Not on file  Social Connections: Not on file   Additional Social History:         Sleep: Good  Appetite:  Good  Current Medications: Current Facility-Administered Medications  Medication Dose Route Frequency Provider Last Rate Last Admin   albuterol (VENTOLIN HFA) 108 (90 Base) MCG/ACT inhaler 1-2 puff  1-2 puff Inhalation Q4H Massengill, Ovid Curd, MD   2 puff at 08/19/21 0836   ascorbic acid (VITAMIN C) tablet 1,000 mg  1,000 mg Oral Daily Suella Broad, FNP   1,000 mg at 08/19/21 0836   atorvastatin (LIPITOR) tablet 10 mg  10 mg Oral Daily Suella Broad, FNP   10 mg at 08/19/21 3300   cloZAPine  (CLOZARIL) tablet 12.5 mg  12.5 mg Oral BID Massengill, Ovid Curd, MD   12.5 mg at 08/19/21 7622   Followed by   Derrill Memo ON 08/20/2021] cloZAPine (CLOZARIL) tablet 25 mg  25 mg Oral BID Massengill, Ovid Curd, MD       divalproex (DEPAKOTE ER) 24 hr tablet 1,000 mg  1,000 mg Oral QHS Rosezetta Schlatter, MD   1,000 mg at 08/18/21 2101   docusate sodium (COLACE) capsule 100 mg  100 mg Oral Daily Rosezetta Schlatter, MD   100 mg at 08/19/21 6333   hydrOXYzine (ATARAX) tablet 25 mg  25 mg Oral TID Suella Broad, FNP   25 mg at 08/19/21 0837   hydrOXYzine (ATARAX) tablet 50 mg  50 mg Oral QHS PRN Rosezetta Schlatter, MD   50 mg at 08/18/21 2100   ibuprofen (ADVIL) tablet 400 mg  400 mg Oral Q6H PRN Suella Broad, FNP   400 mg at 08/16/21 1139  magnesium hydroxide (MILK OF MAGNESIA) suspension 30 mL  30 mL Oral Daily PRN Starkes-Perry, Gayland Curry, FNP       melatonin tablet 3 mg  3 mg Oral QHS Rosezetta Schlatter, MD   3 mg at 08/18/21 2100   metFORMIN (GLUCOPHAGE) tablet 500 mg  500 mg Oral Q breakfast Rosezetta Schlatter, MD   500 mg at 08/19/21 0836   mometasone-formoterol (DULERA) 200-5 MCG/ACT inhaler 2 puff  2 puff Inhalation BID Suella Broad, FNP   2 puff at 08/19/21 0836   simethicone (MYLICON) chewable tablet 80 mg  80 mg Oral QID PRN Rosezetta Schlatter, MD        Lab Results:  No results found for this or any previous visit (from the past 48 hour(s)).   Blood Alcohol level:  Lab Results  Component Value Date   ETH <10 08/11/2021   ETH <10 17/61/6073    Metabolic Disorder Labs: Lab Results  Component Value Date   HGBA1C 5.8 (H) 08/12/2021   MPG 119.76 08/12/2021   MPG 125.5 04/27/2021   Lab Results  Component Value Date   PROLACTIN 74.6 (H) 09/28/2019   PROLACTIN 4.2 (L) 12/19/2018   Lab Results  Component Value Date   CHOL 125 08/16/2021   TRIG 54 08/16/2021   HDL 40 (L) 08/16/2021   CHOLHDL 3.1 08/16/2021   VLDL 11 08/16/2021   LDLCALC 74 08/16/2021   LDLCALC 95  08/12/2021    Physical Findings: AIMS: Facial and Oral Movements Muscles of Facial Expression: None, normal Lips and Perioral Area: None, normal Jaw: None, normal Tongue: None, normal,Extremity Movements Upper (arms, wrists, hands, fingers): None, normal Lower (legs, knees, ankles, toes): None, normal, Trunk Movements Neck, shoulders, hips: None, normal, Overall Severity Severity of abnormal movements (highest score from questions above): None, normal Incapacitation due to abnormal movements: None, normal [name] awareness of abnormal movements (rate only patient's report): No Awareness, Dental Status Current problems with teeth and/or dentures?: No Does patient usually wear dentures?: No    Musculoskeletal: Strength & Muscle Tone: within normal limits Gait & Station: normal Patient leans: N/A  Psychiatric Specialty Exam:  Presentation  General Appearance: Appropriate for Environment  Eye Contact:Fair  Speech:Slurred; normal rate  Speech Volume:Normal  Handedness:Right   Mood and Affect  Mood:Irritable; Euthymic (Feeling tired)  Affect:Congruent; Constricted   Thought Process  Thought Processes:Coherent  Descriptions of Associations:Tangential  Orientation:Partial  Thought Content:Tangential  History of Schizophrenia/Schizoaffective disorder:Yes  Duration of Psychotic Symptoms:Greater than six months  Hallucinations:Hallucinations: None    Ideas of Reference:None  Suicidal Thoughts:Suicidal Thoughts: No    Homicidal Thoughts:Homicidal Thoughts: No     Sensorium  Memory:Immediate Good; Recent Good  Judgment:Fair  Insight:Fair (Improving)   Executive Functions  Concentration:Poor, improving  Attention Span:Poor, improving  [place]   Psychomotor Activity  Psychomotor Activity:Psychomotor Activity: Normal     Assets  Assets:Housing; Leisure Time   Sleep  Sleep: Number of  Hours of Sleep: 7     Physical Exam: Physical Exam Vitals and nursing note reviewed.  Constitutional:      General: She is not in acute distress.    Appearance: Normal appearance. She is not toxic-appearing.  HENT:     Head: Normocephalic and atraumatic.     Mouth/Throat:     Mouth: Mucous membranes are moist.     Pharynx: Oropharynx is clear.  Pulmonary:     Effort: Pulmonary effort is normal.  Skin:    General: Skin is  warm and dry.  Neurological:     General: No focal deficit present.     Mental Status: She is alert and oriented to person, place, and time.     Motor: No weakness.   Review of Systems  Constitutional:  Negative for chills and fever.       Increased sedation  Respiratory:  Negative for shortness of breath.   Cardiovascular:  Negative for chest pain.  Gastrointestinal:  Negative for diarrhea, nausea and vomiting.  Genitourinary:  Positive for frequency. Negative for dysuria, flank pain and urgency.  Musculoskeletal:        Generalized pain  Neurological:  Negative for dizziness, focal weakness and headaches.  Psychiatric/Behavioral:  Negative for depression, hallucinations and suicidal ideas. The patient is not nervous/anxious.   All other systems reviewed and are negative. Blood pressure 115/86, pulse (!) 111, temperature 97.9 F (36.6 C), resp. rate 16, height _0  (1.727 m), weight 117.5 kg, SpO2 100 %. Body mass index is 39.38 kg/m.   ASSESSMENT: Principal Problem:   Schizoaffective disorder, bipolar type ([place]) Active Problems:   Prediabetes   Essential hypertension  Treatment Plan Summary: Daily contact with patient to assess and evaluate symptoms and progress in treatment and Medication management     Safety and Monitoring: voluntarily admission to inpatient psychiatric unit for safety, stabilization and treatment Daily contact with patient to assess and evaluate symptoms and progress in treatment [name] case to be discussed in  multi-disciplinary team meeting Observation Level : q15 minute checks Vital signs: q12 hours Precautions: suicide, elopement, and assault   2.   Psychiatric diagnosis #Schizoaffective disorder-bipolar type #PTSD - Continue Depakote ER 1000 mg nightly for mood stabilization (started 12/16; VPA level 77 on 08-16-2021) -Zyprexa discontinued, due to initiation of Clozaril, as patient was unbalanced with 10 mg Zyprexa.  ACT team confirmed that patient did receive LAI on 12/6,  patient has failed the Invega as monotherapy.  Labs ordered: echocardiogram obtained shows LVEF 60% with grade 1 diastolic dysfunction; CBC with differential- Hgb 11.3, fasting lipid panel- HDL 40, VPA level- 77, troponin- 2, CRP- 0.9, ESR- 42, BNP- 5.1, A1c 5.8%, EKG unremarkable, and beta hCG negative.  Will continue to trend ESR weekly (next check 12/27). --Continue Clozaril 12.5 mg q 12 h x a total 4 doses , then increase to 25 mg x 4 doses, then increase to 37.5 mg x 4 doses, before re-evaluation.   3. Medical Management Covid negative CMP: Unremarkable CBC: Hgb 10.9 EtOH: <10 UDS: Negative TSH: 1.276 A1C: 5.8% Lipids: Unremarkable    #Prediabetes #History of dyslipidemia - Continue metformin 500 mg once daily for prediabetes and prevention of antipsychotic induced metabolic syndrome - Continue home atorvastatin 10 mg daily  #Asthma - Continue home Dulera 2 puffs twice daily  Discharge Planning:              -- Social work and case management to assist with discharge planning and identification of hospital follow-up needs prior to discharge.              -- Estimated LOS: 10-14 days; pending labs for initiation of clozaril and additional admission time required to titrate medication.             -- Discharge Concerns: Need to establish a safety plan; Medication compliance and effectiveness             -- Discharge Goals: Return home with outpatient referrals for mental health follow-up including medication  management/psychotherapy   Breken Nazari  Rosita Kea, MD PGy2 08/19/2021, 3:12 PM

## 2021-08-19 NOTE — Progress Notes (Signed)
Patient denies SI, HI and AVH this shift. Patient has been compliant with medications, but has been isolative to room this shift. Patient has had no incidents of behavioral dyscontrol.   Assess patient for safety, offer medications as prescribed, engage patient in 1:1 therapeutic talks.   Continue to monitor as planned. Patient able to contract for safety.

## 2021-08-19 NOTE — Progress Notes (Signed)
Adult Psychoeducational Group Note  Date:  08/19/2021 Time:  9:04 PM  Group Topic/Focus:  Wrap-Up Group:   The focus of this group is to help patients review their daily goal of treatment and discuss progress on daily workbooks.  Participation Level:  Did Not Attend  Participation Quality:   Did Not Attend  Affect:   Did Not Attend  Cognitive:   Did Not Attend  Insight: None  Engagement in Group:   Did Not Attend  Modes of Intervention:   Did Not Attend  Additional Comments:  Pt did not attend evening wrap up group tonight.  Candy Sledge 08/19/2021, 9:04 PM

## 2021-08-19 NOTE — Group Note (Signed)
Recreation Therapy Group Note   Group Topic:Problem Solving  Group Date: 08/19/2021 Start Time: 1030 End Time: 1157 Facilitators: Victorino Sparrow, LRT,CTRS Location: [address] Dayroom   Goal Area(s) Addresses:  Patient will effectively work in a team with other group members. Patient will verbalize importance of using appropriate problem solving techniques.  Patient will identify positive change associated with effective problem solving skills.   Group Description:  Christmas Trivia.  Patients were broken into groups.  The activity was broken down into four categories (carols, foods, movies/books and traditions/cultures).  Each category also had two rounds.  LRT read off the questions and for each correct answer, team would get the points allotted at the right side of the column.  At the end of each round, teams would talley up their points.  At the completion of the game the team with the most overall total points is the winner.     Affect/Mood: N/A   Participation Level: Did not attend    Clinical Observations/Individualized Feedback:     Plan: Continue to engage patient in RT group sessions 2-3x/week.   Victorino Sparrow, Glennis Brink 08/19/2021 12:56 PM

## 2021-08-19 NOTE — Progress Notes (Signed)
Adult Psychoeducational Group Note  Date:  08/19/2021 Time:  9:02 AM  Group Topic/Focus:  Goals Group:   The focus of this group is to help patients establish daily goals to achieve during treatment and discuss how the patient can incorporate goal setting into their daily lives to aide in recovery.  Participation Level:  Active  Participation Quality:  Appropriate  Affect:  Appropriate  Cognitive:  Appropriate  Insight: Appropriate  Engagement in Group:  Engaged  Modes of Intervention:  Discussion  Additional Comments:  Patient attended morning orientation/goal setting group and said that her goal for today is to discuss her discharge plan with her Physician.   Muneer Leider W Thong Feeny 19/80/2217, 9:02 AM

## 2021-08-20 MED ORDER — ALBUTEROL SULFATE HFA 108 (90 BASE) MCG/ACT IN AERS: 1.0000 | INHALATION_SPRAY | RESPIRATORY_TRACT | Status: DC | PRN

## 2021-08-20 MED ORDER — CLOZAPINE 25 MG PO TABS
37.5000 mg | ORAL_TABLET | Freq: Two times a day (BID) | ORAL | Status: DC
  Administered 2021-08-22: 21:00:00 37.5 mg via ORAL
  Filled 2021-08-20 (×4): qty 2

## 2021-08-20 NOTE — Progress Notes (Addendum)
[facility] MD Progress Note  08/20/2021 7:32 AM [name]  MRN:  [id number] Subjective:  [name] is a 50 year old female with a psychiatric history of schizoaffective disorder-bipolar type and a medical history of asthma and dyslipidemia who presented voluntarily and admitted for disorganized thoughts and paranoia.  Chart Review of Past 24 hrs: The patient's chart was reviewed and nursing notes were reviewed. The patient's case was discussed in multidisciplinary team meeting.  Per MAR: - Patient is compliant with scheduled meds. - PRNs: Hydroxyzine 50 mg x 1 Per RN notes, no documented behavioral issues and has been attending some groups. Patient slept 9.25 hours  Patient had the following psychiatric recommendations yesterday:  - Continue Depakote ER 1000 mg nightly for mood stabilization (started 12/16; VPA level 77 on 08-16-2021) -Zyprexa discontinued, due to initiation of Clozaril, as patient was unbalanced with 10 mg Zyprexa.   - ACT team confirmed that patient did received Invega Sustenna 253m LAI on 12/6,  patient has failed the Invega as monotherapy.  Labs ordered: echocardiogram obtained shows LVEF 60% with grade 1 diastolic dysfunction; CBC with differential- Hgb 11.3, fasting lipid panel- HDL 40, VPA level- 77, troponin- 2, CRP- 0.9, ESR- 42, BNP- 5.1, A1c 5.8%, EKG unremarkable, and beta hCG negative.             Will continue to trend ESR, CRP, CBC, troponin weekly (next check 12/27). --Clozaril titration - increasing by 12.574mdaily as tolerated   On Today's Assessment (08/20/2021): Case was discussed in the multidisciplinary team. MAR was reviewed and patient was compliant with medications.   She reports she is feeling "okay" today. She slept well last night and reports stable appetite.  She denies any depression or anxiety.  She is less tangential and disorganized today; she answers questions appropriately and pauses after answering. She has been tolerating her medications well  and denies any side effects to medications except mild drowsiness.  She reports some dizziness that has been ongoing for the past few days, but reports that it is mild.  She denies all other somatic symptoms she denies SI/HI/AVH, and paranoia.     Review of symptoms, specific for clozapine: Malaise:Denies Chest pain: Denies Shortness of breath: Denies Exertional capacity: Normal; on reassessment with the attending, patient reports that she feels "winded." Tachycardia: Reports feeling somewhat tachycardic subjectively, tachycardia to 114 documented objectively Cough: Denies Fever: Denies Sedation: Reports mild sedation. Orthostatic hypotension (dizziness with standing): Confirms, ongoing prior to initiation of Clozapine Hypersalivation: some baseline drooling, but not excessive Constipation: Denies Symptoms of GERD: Reports some heartburn when lying down, denies while sitting upright Nausea: Denies Nocturnal enuresis: Denies   Principal Problem: Schizoaffective disorder, bipolar type (HCFerdinandDiagnosis: Principal Problem:   Schizoaffective disorder, bipolar type (HCR[place] EstatesActive Problems:   Prediabetes   Essential hypertension   Past Psychiatric History: See H&P  Past Medical History:  Past Medical History:  Diagnosis Date   Anxiety    Asthma    Bipolar 1 disorder ([place]   Depression    Gallstones 08/2020   Hypertension    Insomnia, persistent    Prediabetes    Schizophrenic disorder (HC[place]   Seizures ([place]CL[place]    Past Surgical History:  Procedure Laterality Date   BREAST LUMPECTOMY Left 07/2013   TONSILLECTOMY     TUBAL LIGATION  2002   Family History:  Family History  Problem Relation Age of Onset   Depression Mother    Gout Mother    Cancer Father  prostate   Other Father        lung issue   Alcoholism Other    Heart attack Paternal Grandfather    Heart attack Paternal Grandmother    Heart attack Maternal Grandmother    Heart attack Maternal Grandfather     Depression Son    Anxiety disorder Son    Family Psychiatric  History: See H&P Social History:  Social History   Substance and Sexual Activity  Alcohol Use Not Currently     Social History   Substance and Sexual Activity  Drug Use No    Social History   Socioeconomic History   Marital status: Single    Spouse name: Not on file   Number of children: Not on file   Years of education: Not on file   Highest education level: Not on file  Occupational History   Not on file  Tobacco Use   Smoking status: Former    Types: Cigarettes   Smokeless tobacco: Never  Vaping Use   Vaping Use: Never used  Substance and Sexual Activity   Alcohol use: Not Currently   Drug use: No   Sexual activity: Not Currently  Other Topics Concern   Not on file  Social History Narrative   Not on file   Social Determinants of Health   Financial Resource Strain: Not on file  Food Insecurity: Food Insecurity Present   Worried About [place] in the Last Year: Sometimes true   Ran Out of Food in the Last Year: Sometimes true  Transportation Needs: No Transportation Needs   Lack of Transportation (Medical): No   Lack of Transportation (Non-Medical): No  Physical Activity: Not on file  Stress: Not on file  Social Connections: Not on file   Additional Social History:         Sleep: Good  Appetite:  Good  Current Medications: Current Facility-Administered Medications  Medication Dose Route Frequency Provider Last Rate Last Admin   albuterol (VENTOLIN HFA) 108 (90 Base) MCG/ACT inhaler 1-2 puff  1-2 puff Inhalation Q4H Massengill, Ovid Curd, MD   2 puff at 08/19/21 1719   ascorbic acid (VITAMIN C) tablet 1,000 mg  1,000 mg Oral Daily Suella Broad, FNP   1,000 mg at 08/19/21 0836   atorvastatin (LIPITOR) tablet 10 mg  10 mg Oral Daily Suella Broad, FNP   10 mg at 08/19/21 5885   cloZAPine (CLOZARIL) tablet 12.5 mg  12.5 mg Oral BID Massengill, Ovid Curd, MD   12.5 mg  at 08/19/21 2104   Followed by   cloZAPine (CLOZARIL) tablet 25 mg  25 mg Oral BID Massengill, Ovid Curd, MD       divalproex (DEPAKOTE ER) 24 hr tablet 1,000 mg  1,000 mg Oral QHS Rosezetta Schlatter, MD   1,000 mg at 08/19/21 2104   docusate sodium (COLACE) capsule 100 mg  100 mg Oral Daily Rosezetta Schlatter, MD   100 mg at 08/19/21 0277   hydrOXYzine (ATARAX) tablet 25 mg  25 mg Oral TID Suella Broad, FNP   25 mg at 08/19/21 1719   hydrOXYzine (ATARAX) tablet 50 mg  50 mg Oral QHS PRN Rosezetta Schlatter, MD   50 mg at 08/19/21 2104   ibuprofen (ADVIL) tablet 400 mg  400 mg Oral Q6H PRN Suella Broad, FNP   400 mg at 08/16/21 1139   magnesium hydroxide (MILK OF MAGNESIA) suspension 30 mL  30 mL Oral Daily PRN Suella Broad, FNP  melatonin tablet 3 mg  3 mg Oral QHS Rosezetta Schlatter, MD   3 mg at 08/19/21 2104   metFORMIN (GLUCOPHAGE) tablet 500 mg  500 mg Oral Q breakfast Rosezetta Schlatter, MD   500 mg at 08/19/21 0836   mometasone-formoterol (DULERA) 200-5 MCG/ACT inhaler 2 puff  2 puff Inhalation BID Suella Broad, FNP   2 puff at 08/19/21 0836   simethicone (MYLICON) chewable tablet 80 mg  80 mg Oral QID PRN Rosezetta Schlatter, MD        Lab Results:  No results found for this or any previous visit (from the past 48 hour(s)).   Blood Alcohol level:  Lab Results  Component Value Date   ETH <10 08/11/2021   ETH <10 32/20/2542    Metabolic Disorder Labs: Lab Results  Component Value Date   HGBA1C 5.8 (H) 08/12/2021   MPG 119.76 08/12/2021   MPG 125.5 04/27/2021   Lab Results  Component Value Date   PROLACTIN 74.6 (H) 09/28/2019   PROLACTIN 4.2 (L) 12/19/2018   Lab Results  Component Value Date   CHOL 125 08/16/2021   TRIG 54 08/16/2021   HDL 40 (L) 08/16/2021   CHOLHDL 3.1 08/16/2021   VLDL 11 08/16/2021   LDLCALC 74 08/16/2021   LDLCALC 95 08/12/2021    Physical Findings: AIMS: Facial and Oral Movements Muscles of Facial Expression: None,  normal Lips and Perioral Area: None, normal Jaw: None, normal Tongue: None, normal,Extremity Movements Upper (arms, wrists, hands, fingers): None, normal Lower (legs, knees, ankles, toes): None, normal, Trunk Movements Neck, shoulders, hips: None, normal, Overall Severity Severity of abnormal movements (highest score from questions above): None, normal Incapacitation due to abnormal movements: None, normal [name] awareness of abnormal movements (rate only patient's report): No Awareness, Dental Status Current problems with teeth and/or dentures?: No Does patient usually wear dentures?: No    Musculoskeletal: Strength & Muscle Tone: within normal limits Gait & Station: normal Patient leans: N/A  Psychiatric Specialty Exam:  Presentation  General Appearance: Appropriate for Environment  Eye Contact:Fair  Speech:Slurred; normal rate  Speech Volume:Normal  Handedness:Right   Mood and Affect  Mood: Euthymic, but tired  Affect:Congruent; Constricted   Thought Process  Thought Processes:Coherent  Descriptions of Associations:Less Tangential  Orientation: Oriented to person, place, and time  Thought Content: Denies SI/HI/AVH, paranoia and other first rank symptoms.  Does not voice delusions  History of Schizophrenia/Schizoaffective disorder:Yes  Duration of Psychotic Symptoms:Greater than six months  Hallucinations:Hallucinations: None    Ideas of Reference:None  Suicidal Thoughts:Suicidal Thoughts: No    Homicidal Thoughts:Homicidal Thoughts: No     Sensorium  Memory:Immediate Good; Recent Good  Judgment:Fair  Insight:Fair (Improving)   Executive Functions  Concentration: Fair improving  Attention Span: Fair, improving  [place]   Psychomotor Activity  Psychomotor Activity:Psychomotor Activity: Normal     Assets  Assets:Housing; Leisure Time   Sleep  Sleep: Number of Hours of  Sleep: 9.25   Physical Exam Vitals and nursing note reviewed.  Constitutional:      General: She is not in acute distress.    Appearance: Normal appearance. She is not toxic-appearing.  HENT:     Head: Normocephalic and atraumatic.  Pulmonary:     Effort: Pulmonary effort is normal.  Skin:    General: Skin is warm and dry.  Neurological:     General: No focal deficit present.     Mental Status: She is alert and oriented to person, place,  and time.     Motor: No weakness.   Review of Systems  Constitutional:  Negative for chills, fever and malaise/fatigue.       Increased sedation  Respiratory:  Negative for shortness of breath.   Cardiovascular:  Positive for palpitations. Negative for chest pain.  Gastrointestinal:  Positive for heartburn. Negative for diarrhea, nausea and vomiting.  Genitourinary:  Positive for frequency. Negative for dysuria, flank pain and urgency.  Musculoskeletal:        Generalized pain  Neurological:  Negative for dizziness, focal weakness and headaches.  Psychiatric/Behavioral:  Negative for depression, hallucinations and suicidal ideas. The patient is not nervous/anxious.   All other systems reviewed and are negative. Blood pressure 119/82, pulse (!) 114, temperature 98.1 F (36.7 C), temperature source Oral, resp. rate 16, height '5\' 8"'  (1.727 m), weight 117.5 kg, SpO2 100 %. Body mass index is 39.38 kg/m.   ASSESSMENT: Principal Problem:   Schizoaffective disorder, bipolar type ([place]) Active Problems:   Prediabetes   Essential hypertension  Treatment Plan Summary: Daily contact with patient to assess and evaluate symptoms and progress in treatment and Medication management     Safety and Monitoring: voluntarily admission to inpatient psychiatric unit for safety, stabilization and treatment Daily contact with patient to assess and evaluate symptoms and progress in treatment [name] case to be discussed in multi-disciplinary team  meeting Observation Level : q15 minute checks Vital signs: q12 hours Precautions: suicide, elopement, and assault   2.   Psychiatric diagnosis #Schizoaffective disorder-bipolar type #PTSD - Continue Depakote ER 1000 mg nightly for mood stabilization (started 12/16; VPA level 77 on 08-16-2021,WBC 5.5, platelets 273, and H/H 11.3/35.2 (c/w baseline anemia) on 12/20, will recheck LFTs at next lab draw) -Zyprexa discontinued, due to initiation of Clozaril, as patient was unbalanced with 10 mg Zyprexa.  ACT team confirmed that patient did receive LAI on 12/6,  patient has failed the Invega as monotherapy.  Labs ordered: echocardiogram obtained shows LVEF 60% with grade 1 diastolic dysfunction; CBC with differential- Hgb 11.3, fasting lipid panel- HDL 40, VPA level- 77, troponin- 2, CRP- 0.9, ESR- 42, BNP- 5.1, A1c 5.8%, EKG unremarkable, and beta hCG negative.  Will continue to trend ESR, troponin, CBC, and CRP weekly (next check 12/27). --Continue increasing Clozaril by 12.61m daily as tolerated - presently at 37.545mtoday  -Continue Colace 100 mg for bowel regimen while on clozapine.   3. Medical Management Covid negative CMP: Unremarkable CBC: Hgb 10.9 EtOH: <10 UDS: Negative TSH: 1.276 A1C: 5.8% Lipids: Unremarkable    #Prediabetes #History of dyslipidemia - Continue metformin 500 mg once daily for prediabetes and prevention of antipsychotic induced metabolic syndrome - Continue home atorvastatin 10 mg daily  #Asthma - Continue home Dulera 2 puffs twice daily  Discharge Planning:              -- Social work and case management to assist with discharge planning and identification of hospital follow-up needs prior to discharge.              -- Estimated LOS: 10-14 days; pending labs for initiation of clozaril and additional admission time required to titrate medication.             -- Discharge Concerns: Need to establish a safety plan; Medication compliance and effectiveness              -- Discharge Goals: Return home with outpatient referrals for mental health follow-up including medication management/psychotherapy   CoRosezetta SchlatterMD  PGY-1 08/20/2021, 7:32 AM

## 2021-08-20 NOTE — Progress Notes (Signed)
Adult Psychoeducational Group Note  Date:  08/20/2021 Time:  11:42 PM  Group Topic/Focus:  Wrap-Up Group:   The focus of this group is to help patients review their daily goal of treatment and discuss progress on daily workbooks.  Participation Level:  Active  Participation Quality:  Appropriate  Affect:  Appropriate  Cognitive:  Appropriate  Insight: Appropriate  Engagement in Group:  Engaged  Modes of Intervention:  Discussion  Additional Comments:  Pt stated her goal for today was to focus on her treatment plan. Pt stated she accomplished her goal today. Pt stated she talked with her doctor and her social worker about her care today. Pt rated her overall day a 9 out of 10. Pt stated she was able to contact her uncle, mother, daughter, and her Father tonight which improved her overall day. Pt stated she felt better about herself tonight. Pt stated she was able to attend all groups held today. Pt stated she was able to attend all meals. Pt stated she took all medications provided today. Pt stated her appetite was pretty good today. Pt rated her sleep last night was pretty good. Pt stated the goal tonight was to get some rest. Pt stated she had some physical pain tonight. Pt stated she had some mild pain in right leg tonight. Pt rate the mild pain in her right leg a 3 on the pain level scale. Pt nurse was updated on the situation. Pt deny visual hallucinations and auditory issues tonight. Pt denies thoughts of harming herself or others. Pt stated she would alert staff if anything changed.  Candy Sledge 08/20/2021, 11:42 PM

## 2021-08-20 NOTE — Progress Notes (Addendum)
°   08/20/21 1600  Psych Admission Type (Psych Patients Only)  Admission Status Voluntary  Psychosocial Assessment  Patient Complaints None  Eye Contact Fair  Facial Expression Flat  Affect Appropriate to circumstance  Speech Logical/coherent  Interaction Assertive  Motor Activity Slow  Appearance/Hygiene In scrubs  Behavior Characteristics Cooperative  Mood Preoccupied;Pleasant  Aggressive Behavior  Effect No apparent injury  Thought Process  Coherency Circumstantial  Content WDL  Delusions WDL  Perception WDL  Hallucination None reported or observed  Judgment WDL  Confusion None  Danger to Self  Current suicidal ideation? Denies  Danger to Others  Danger to Others None reported or observed   D. Pt stayed in bed much of the morning, but did come out for group and was observed interacting appropriately with peers and staff. Pt has been compliant with her medications, and denied having any noticeable side effects. Pt denied SI/HI and A/VH, and does not appear to be responding to internal stimuli. A. Labs and vitals monitored. Pt given and educated on medications. Pt supported emotionally and encouraged to express concerns and ask questions.   R. Pt remains safe with 15 minute checks. Will continue POC.

## 2021-08-20 NOTE — Progress Notes (Signed)
°   08/20/21 0500  Sleep  Number of Hours 9.25

## 2021-08-20 NOTE — Group Note (Signed)
[place] LCSW Group Therapy Note  08/20/2021  10:00-11:00AM  Type of Therapy and Topic:  Group Therapy:  Acknowledging and Resolving Issues about the Holidays  Participation Level:  Active   Description of Group:   Patients in this group were asked to briefly describe their experiences with Christmas/winter holidays in their lives, both in childhood and adulthood.  Different types of support provided by the influencing individuals in their lives were identified.   Patients were then encouraged to determine whether their influencing figures were/are healthy or unhealthy for them.  The manner in which those early experiences have shaped patient's feelings and actions on an ongoing basis was pointed out and acknowledged.  Group members gave support to each other.  CSW led a discussion on how helpful it can be to resolve past issues, and how this can be done whether the influencing figures are now alive or already deceased.  An emphasis was placed on continuing to work with a therapist on these issues  when patients leave the hospital in order to be able to focus on the future instead of the past, to continue becoming healthier and happier.   Therapeutic Goals: 1)  discuss the possibility of influencing figure(s) being positive and/or negative in one's life, normalizing that some people never had positive experiences with "support" persons  2)  describe patient's specific example of their typical holiday experiences, allowing time to vent  3)  identify the patient's current need to decide how they now want to experience the holidays, by choice instead of only following the traditions of the past  4)  elicit commitments to work on resolving ambivalent feelings about the holidays in order to move forward in life and wellness   Summary of Patient Progress:  The patient expressed comprehension of the concepts presented.   She shared that past holidays were good even though sometimes they were very poor and had  to ration out food like pinto beans, greens of any sort, and such foods that would have to last until the new year.  She said things have changed a lot for her this year, and she is glad to be in the hospital to adjust.  She specifically mentioned the reduction in the number of times she is intrusive now.   Therapeutic Modalities:   Processing Brief Solution-Focused Therapy  Maretta Los

## 2021-08-20 NOTE — Progress Notes (Signed)
°   08/20/21 2030  Psych Admission Type (Psych Patients Only)  Admission Status Voluntary  Psychosocial Assessment  Patient Complaints None  Eye Contact Fair  Facial Expression Flat  Affect Appropriate to circumstance  Speech Logical/coherent  Interaction Assertive  Motor Activity Slow  Appearance/Hygiene In hospital gown  Behavior Characteristics Cooperative;Appropriate to situation  Mood Preoccupied;Pleasant  Thought Process  Coherency Circumstantial  Content WDL  Delusions None reported or observed  Perception WDL  Hallucination None reported or observed  Judgment WDL  Confusion None  Danger to Self  Current suicidal ideation? Denies  Danger to Others  Danger to Others None reported or observed

## 2021-08-21 NOTE — Progress Notes (Addendum)
[facility] MD Progress Note  08/21/2021 7:25 AM [name]  MRN:  [id number] Subjective:  [name] is a 50 year old female with a psychiatric history of schizoaffective disorder-bipolar type and a medical history of asthma and dyslipidemia who presented voluntarily and admitted for disorganized thoughts and paranoia.  Chart Review of Past 24 hrs: The patient's chart was reviewed and nursing notes were reviewed. The patient's case was discussed in multidisciplinary team meeting.  Per MAR: - Patient is compliant with scheduled meds. - PRNs: None Per RN notes, no documented behavioral issues and has been attending some groups. Patient sleep hours not documented  Patient had the following psychiatric recommendations yesterday:  - Continue Depakote ER 1000 mg nightly for mood stabilization (started 12/16; VPA level 77 on 08-16-2021,WBC 5.5, platelets 273, and H/H 11.3/35.2 (c/w baseline anemia) on 12/20, will recheck LFTs at next lab draw) -Zyprexa discontinued, due to initiation of Clozaril, as patient was unbalanced with 10 mg Zyprexa.  ACT team confirmed that patient did receive LAI on 12/6,  patient has failed the Invega as monotherapy.  Labs ordered: echocardiogram obtained shows LVEF 60% with grade 1 diastolic dysfunction; CBC with differential- Hgb 11.3, fasting lipid panel- HDL 40, VPA level- 77, troponin- 2, CRP- 0.9, ESR- 42, BNP- 5.1, A1c 5.8%, EKG unremarkable, and beta hCG negative.             Will continue to trend ESR, troponin, CBC, and CRP weekly (next check 12/27). --Continue increasing Clozaril by 12.40m daily; total of 37.5 mg received -Continue Colace 100 mg for bowel regimen while on clozapine.  On Today's Assessment (08/21/2021): Case was discussed in the multidisciplinary team. MAR was reviewed and patient was compliant with medications.   She reports she is feeling "okay, but tired" today. She slept poorly last night (but slept through most of the afternoon and evening) and  reports stable appetite.  She denies any depression or anxiety.  She is less tangential and disorganized today on initial assessment; she answers questions appropriately and pauses after answering.  On reassessment with the attending, she is a bit more tangential and disorganized, but not to the degree on admission.  She has been tolerating her medications well and denies any side effects to medications except mild drowsiness.  She denies dizziness today, subsided from the past few days.  She denies all other somatic symptoms. She denies SI/HI/AVH,ideas of reference, but reports mild paranoia with select peers on the unit.   Review of symptoms, specific for clozapine: Malaise:Denies Chest pain: Denies Shortness of breath: Denies Exertional capacity: Normal, "winded" with increased activity Tachycardia: Reports denies palpitations subjectively, pulse 98 and 113 on recheck this a.m. Cough: Denies Fever: Denies Sedation: Reports mild sedation. Orthostatic hypotension (dizziness with standing): Denies today Hypersalivation: some baseline drooling, denies today Constipation: Denies Symptoms of GERD: Denies Nausea: Denies Nocturnal enuresis: Denies   Principal Problem: Schizoaffective disorder, bipolar type ([place] Diagnosis: Principal Problem:   Schizoaffective disorder, bipolar type ([place] Active Problems:   Prediabetes   Essential hypertension   Past Psychiatric History: See H&P  Past Medical History:  Past Medical History:  Diagnosis Date   Anxiety    Asthma    Bipolar 1 disorder ([place]    Depression    Gallstones 08/2020   Hypertension    Insomnia, persistent    Prediabetes    Schizophrenic disorder ([facility]    Seizures ([place]     Past Surgical History:  Procedure Laterality Date   BREAST LUMPECTOMY Left 07/2013  TONSILLECTOMY     TUBAL LIGATION  2002   Family History:  Family History  Problem Relation Age of Onset   Depression Mother    Gout Mother    Cancer Father         prostate   Other Father        lung issue   Alcoholism Other    Heart attack Paternal Grandfather    Heart attack Paternal Grandmother    Heart attack Maternal Grandmother    Heart attack Maternal Grandfather    Depression Son    Anxiety disorder Son    Family Psychiatric  History: See H&P Social History:  Social History   Substance and Sexual Activity  Alcohol Use Not Currently     Social History   Substance and Sexual Activity  Drug Use No    Social History   Socioeconomic History   Marital status: Single    Spouse name: Not on file   Number of children: Not on file   Years of education: Not on file   Highest education level: Not on file  Occupational History   Not on file  Tobacco Use   Smoking status: Former    Types: Cigarettes   Smokeless tobacco: Never  Vaping Use   Vaping Use: Never used  Substance and Sexual Activity   Alcohol use: Not Currently   Drug use: No   Sexual activity: Not Currently  Other Topics Concern   Not on file  Social History Narrative   Not on file   Social Determinants of Health   Financial Resource Strain: Not on file  Food Insecurity: Food Insecurity Present   Worried About [place] in the Last Year: Sometimes true   [place] in the Last Year: Sometimes true  Transportation Needs: No Transportation Needs   Lack of Transportation (Medical): No   Lack of Transportation (Non-Medical): No  Physical Activity: Not on file  Stress: Not on file  Social Connections: Not on file   Additional Social History:         Sleep: Good  Appetite:  Good  Current Medications: Current Facility-Administered Medications  Medication Dose Route Frequency Provider Last Rate Last Admin   albuterol (VENTOLIN HFA) 108 (90 Base) MCG/ACT inhaler 1-2 puff  1-2 puff Inhalation Q4H PRN Rosezetta Schlatter, MD       ascorbic acid (VITAMIN C) tablet 1,000 mg  1,000 mg Oral Daily Suella Broad, FNP   1,000 mg at 08/20/21  0835   atorvastatin (LIPITOR) tablet 10 mg  10 mg Oral Daily Suella Broad, FNP   10 mg at 08/20/21 4562   cloZAPine (CLOZARIL) tablet 25 mg  25 mg Oral BID Massengill, Ovid Curd, MD   25 mg at 08/20/21 2030   [START ON 08/22/2021] cloZAPine (CLOZARIL) tablet 37.5 mg  37.5 mg Oral BID Rosezetta Schlatter, MD       divalproex (DEPAKOTE ER) 24 hr tablet 1,000 mg  1,000 mg Oral QHS Rosezetta Schlatter, MD   1,000 mg at 08/20/21 2029   docusate sodium (COLACE) capsule 100 mg  100 mg Oral Daily Rosezetta Schlatter, MD   100 mg at 08/20/21 5638   hydrOXYzine (ATARAX) tablet 25 mg  25 mg Oral TID Suella Broad, FNP   25 mg at 08/20/21 2030   hydrOXYzine (ATARAX) tablet 50 mg  50 mg Oral QHS PRN Rosezetta Schlatter, MD   50 mg at 08/19/21 2104   ibuprofen (ADVIL) tablet 400 mg  400 mg Oral Q6H PRN Suella Broad, FNP   400 mg at 08/16/21 1139   magnesium hydroxide (MILK OF MAGNESIA) suspension 30 mL  30 mL Oral Daily PRN Starkes-Perry, Gayland Curry, FNP       melatonin tablet 3 mg  3 mg Oral QHS Rosezetta Schlatter, MD   3 mg at 08/20/21 2030   metFORMIN (GLUCOPHAGE) tablet 500 mg  500 mg Oral Q breakfast Rosezetta Schlatter, MD   500 mg at 08/20/21 0835   mometasone-formoterol (DULERA) 200-5 MCG/ACT inhaler 2 puff  2 puff Inhalation BID Suella Broad, FNP   2 puff at 08/20/21 2030   simethicone (MYLICON) chewable tablet 80 mg  80 mg Oral QID PRN Rosezetta Schlatter, MD        Lab Results:  No results found for this or any previous visit (from the past 48 hour(s)).   Blood Alcohol level:  Lab Results  Component Value Date   ETH <10 08/11/2021   ETH <10 17/51/0258    Metabolic Disorder Labs: Lab Results  Component Value Date   HGBA1C 5.8 (H) 08/12/2021   MPG 119.76 08/12/2021   MPG 125.5 04/27/2021   Lab Results  Component Value Date   PROLACTIN 74.6 (H) 09/28/2019   PROLACTIN 4.2 (L) 12/19/2018   Lab Results  Component Value Date   CHOL 125 08/16/2021   TRIG 54 08/16/2021   HDL 40  (L) 08/16/2021   CHOLHDL 3.1 08/16/2021   VLDL 11 08/16/2021   LDLCALC 74 08/16/2021   LDLCALC 95 08/12/2021    Physical Findings: AIMS: Facial and Oral Movements Muscles of Facial Expression: None, normal Lips and Perioral Area: None, normal Jaw: None, normal Tongue: None, normal,Extremity Movements Upper (arms, wrists, hands, fingers): None, normal Lower (legs, knees, ankles, toes): None, normal, Trunk Movements Neck, shoulders, hips: None, normal, Overall Severity Severity of abnormal movements (highest score from questions above): None, normal Incapacitation due to abnormal movements: None, normal [name] awareness of abnormal movements (rate only patient's report): No Awareness, Dental Status Current problems with teeth and/or dentures?: No Does patient usually wear dentures?: No    Musculoskeletal: Strength & Muscle Tone: within normal limits Gait & Station: normal Patient leans: N/A  Psychiatric Specialty Exam:  Presentation  General Appearance: Appropriate for Environment  Eye Contact:Fair  Speech:mumbling quality, normal rate and fluency  Speech Volume:Normal  Handedness:Right   Mood and Affect  Mood: Euthymic, but tired  Affect:constricted   Thought Process  Thought Processes:Tangential  Descriptions of Associations:Less Tangential initially, more tangential on reassessment, but remains less tangential overall  Orientation: Oriented to person, place, and time  Thought Content: Denies SI/HI/AVH, ideas of reference or first rank symptoms on the unit.  Does not voice delusions.  Reports some residual paranoia with select peers on the unit  History of Schizophrenia/Schizoaffective disorder:Yes  Duration of Psychotic Symptoms:Greater than six months  Hallucinations: Denies  Ideas of Reference:None  Suicidal Thoughts:Denies  Homicidal Thoughts:Denies  Sensorium  Memory:Immediate Good; Recent Good  Judgment:Fair  Insight:Fair  (Improving)   Executive Functions  Concentration: Fair improving  Attention Span: Fair, improving  [place]   Psychomotor Activity  Psychomotor Activity:sedated appearing   Assets  Assets:Housing; Leisure Time   Sleep  Number of Hours of Sleep: Not documented   Physical Exam Vitals and nursing note reviewed.  Constitutional:      General: She is not in acute distress.    Appearance: Normal appearance. She is not toxic-appearing.  HENT:  Head: Normocephalic and atraumatic.  Pulmonary:     Effort: Pulmonary effort is normal.  Skin:    General: Skin is warm and dry.  Neurological:     General: No focal deficit present.     Mental Status: She is alert and oriented to person, place, and time.     Motor: No weakness.   Review of Systems  Constitutional:  Negative for chills, fever and malaise/fatigue.       Increased sedation  Respiratory:  Negative for shortness of breath.   Cardiovascular:  Negative for chest pain and palpitations.  Gastrointestinal:  Negative for diarrhea, heartburn, nausea and vomiting.  Genitourinary:  Negative for dysuria, flank pain, frequency and urgency.  Neurological:  Negative for dizziness, focal weakness and headaches.  Psychiatric/Behavioral:  Negative for depression, hallucinations and suicidal ideas. The patient is not nervous/anxious.   All other systems reviewed and are negative. Blood pressure 118/85, pulse (!) 113, temperature 97.8 F (36.6 C), temperature source Oral, resp. rate 16, height _0  (1.727 m), weight 117.5 kg, SpO2 100 %. Body mass index is 39.38 kg/m.   ASSESSMENT: Principal Problem:   Schizoaffective disorder, bipolar type ([place]) Active Problems:   Prediabetes   Essential hypertension  Treatment Plan Summary: Daily contact with patient to assess and evaluate symptoms and progress in treatment and Medication management     Safety and Monitoring: voluntarily  admission to inpatient psychiatric unit for safety, stabilization and treatment Daily contact with patient to assess and evaluate symptoms and progress in treatment [name] case to be discussed in multi-disciplinary team meeting Observation Level : q15 minute checks Vital signs: q12 hours Precautions: suicide, elopement, and assault   2.   Psychiatric diagnosis #Schizoaffective disorder-bipolar type #PTSD - Continue Depakote ER 1000 mg nightly for mood stabilization (started 12/16; VPA level 77 on 08-16-2021,WBC 5.5, platelets 273, and H/H 11.3/35.2 (c/w baseline anemia) on 12/20, will recheck LFTs at next lab draw 12/27) -Zyprexa discontinued, due to initiation of Clozaril, as patient was unbalanced with 10 mg Zyprexa.  ACT team confirmed that patient did receive Invega sustenna 238m LAI on 12/6,  patient has failed the Invega as monotherapy.  Labs ordered: echocardiogram obtained shows LVEF 60% with grade 1 diastolic dysfunction; CBC with differential- Hgb 11.3, fasting lipid panel- HDL 40, VPA level- 77, troponin- 2, CRP- 0.9, ESR- 42, BNP- 5.1, A1c 5.8%, EKG unremarkable, and beta hCG negative.  Will continue to trend ESR, troponin, CBC, and CRP weekly (next check 12/27). --Continue increasing Clozaril by 12.579mdaily. Today's daily total of 50 mg. -Continue Colace 100 mg for bowel regimen while on clozapine.   3. Medical Management: On admission Covid negative CMP: Unremarkable CBC: Hgb 10.9 EtOH: <10 UDS: Negative TSH: 1.276 A1C: 5.8% Lipids: Unremarkable   #Prediabetes #History of dyslipidemia - Continue metformin 500 mg once daily for prediabetes and prevention of antipsychotic induced metabolic syndrome - Continue home atorvastatin 10 mg daily  #Asthma - Continue home Dulera 2 puffs twice daily  Discharge Planning:              -- Social work and case management to assist with discharge planning and identification of hospital follow-up needs prior to discharge.               -- Estimated LOS: 10-14 days; pending labs for initiation of clozaril and additional admission time required to titrate medication.             -- Discharge Concerns: Need to establish a safety  plan; Medication compliance and effectiveness             -- Discharge Goals: Return home with outpatient referrals for mental health follow-up including medication management/psychotherapy   Rosezetta Schlatter, MD  PGY-1 08/21/2021, 7:25 AM

## 2021-08-21 NOTE — Plan of Care (Signed)
°  Problem: Coping: Goal: Ability to verbalize frustrations and anger appropriately will improve Outcome: Progressing Goal: Ability to demonstrate self-control will improve Outcome: Progressing

## 2021-08-21 NOTE — Progress Notes (Signed)
Progress note    08/21/21 0752  Psych Admission Type (Psych Patients Only)  Admission Status Voluntary  Psychosocial Assessment  Patient Complaints Anxiety  Eye Contact Fair  Facial Expression Anxious  Affect Anxious;Appropriate to circumstance  Speech Logical/coherent  Interaction Assertive  Motor Activity Slow  Appearance/Hygiene In hospital gown  Behavior Characteristics Cooperative;Appropriate to situation;Anxious  Mood Anxious;Pleasant  Thought Process  Coherency Circumstantial  Content WDL  Delusions None reported or observed  Perception WDL  Hallucination None reported or observed  Judgment WDL  Confusion None  Danger to Self  Current suicidal ideation? Denies  Danger to Others  Danger to Others None reported or observed

## 2021-08-21 NOTE — B[facility] Group Notes (Signed)
[facility] LCSW Group Therapy Note  Date/Time:  08/21/2021  10:30AM-11:30AM  Type of Therapy and Topic:  Group Therapy:  Holiday Music  Participation Level:  Active   Description of Group: In this process group, members made requests for their favorite holiday songs, which were played for all participants to enjoy.  Patients were encouraged to use music as a coping tool when they go home.  The patients as a whole were sad about being in the hospital for this holiday and were also encouraged to consider adopting a day after discharge as their Christmas Day for this year.  Therapeutic Goals: Patients will choose songs that are meaningful to them and have the opportunity to listen to them Patients will explore the use of music as a coping skill  Summary of Patient Progress:  The patient was quite late to group, was only present for approximately the last 3 songs.  She chose "Let It Emogene Morgan" for the group to listen to.  The patient was attentive but also made off-topic, rambling remarks.  She did say that even though the patients are in the hospital for Christmas,  "at least we're getting help."    Therapeutic Modalities:  Activity   Selmer Dominion, LCSW

## 2021-08-22 ENCOUNTER — Encounter (HOSPITAL_COMMUNITY): Payer: Self-pay

## 2021-08-22 MED ORDER — TUBERCULIN PPD 5 UNIT/0.1ML ID SOLN
5.0000 [IU] | Freq: Once | INTRADERMAL | Status: AC
  Administered 2021-08-22: 12:00:00 5 [IU] via INTRADERMAL
  Filled 2021-08-22: qty 0.1

## 2021-08-22 NOTE — Progress Notes (Signed)
RN administered ppd tb test in pt's left arm.  RN circled injection area with black marker.  Can be read 12-28 or 12-29 by 12:06 pm.

## 2021-08-22 NOTE — Progress Notes (Signed)
Pt continues to be anxious.  Pt complied with scheduled medications. Pt currently denies SI/ HI/AVH and pain to this writer during the shift. Pt interacts appropriately with others in the milieu. Pt verbally contracted for safety.   A: Support and encouragement was offered and accepted. Pt medications were administered per order. Safety rounds continued and maintained Q 15 throughout the shift.    R: Safety maintained. Will continue to monitor and assess.      08/22/21 0000  Psych Admission Type (Psych Patients Only)  Admission Status Voluntary  Psychosocial Assessment  Patient Complaints Anxiety  Eye Contact Fair  Facial Expression Anxious  Affect Anxious;Appropriate to circumstance  Speech Logical/coherent  Interaction Assertive  Motor Activity Slow  Appearance/Hygiene In hospital gown  Behavior Characteristics Cooperative  Thought Process  Coherency Circumstantial  Content WDL  Delusions None reported or observed  Perception WDL  Hallucination None reported or observed  Judgment WDL  Confusion None  Danger to Self  Current suicidal ideation? Denies  Danger to Others  Danger to Others None reported or observed

## 2021-08-22 NOTE — Progress Notes (Signed)
Adult Psychoeducational Group Note  Date:  08/22/2021 Time:  8:59 PM  Group Topic/Focus:  Wrap-Up Group:   The focus of this group is to help patients review their daily goal of treatment and discuss progress on daily workbooks.  Participation Level:  Did Not Attend  Participation Quality:   Did Not Attend  Affect:   Did Not Attend  Cognitive:   Did Not Attend  Insight: None  Engagement in Group:   Did Not Attend  Modes of Intervention:   Did Not Attend  Additional Comments:  Pt did not attend evening wrap up group tonight.  Candy Sledge 08/22/2021, 8:59 PM

## 2021-08-22 NOTE — Progress Notes (Signed)
°   08/22/21 0730  Psych Admission Type (Psych Patients Only)  Admission Status Voluntary  Psychosocial Assessment  Patient Complaints Anxiety  Eye Contact Fair  Facial Expression Anxious  Affect Anxious;Appropriate to circumstance  Speech Logical/coherent  Interaction Assertive  Motor Activity Slow  Appearance/Hygiene In hospital gown  Behavior Characteristics Cooperative  Mood Anxious  Thought Process  Coherency Circumstantial  Content WDL  Delusions None reported or observed  Perception WDL  Hallucination None reported or observed  Judgment WDL  Confusion None  Danger to Self  Current suicidal ideation? Denies  Danger to Others  Danger to Others None reported or observed

## 2021-08-22 NOTE — Progress Notes (Signed)
°   08/22/21 2300  Psych Admission Type (Psych Patients Only)  Admission Status Voluntary  Psychosocial Assessment  Patient Complaints Anxiety  Eye Contact Fair  Facial Expression Anxious  Affect Anxious;Appropriate to circumstance  Speech Logical/coherent  Interaction Assertive  Motor Activity Slow  Appearance/Hygiene In hospital gown  Behavior Characteristics Cooperative  Mood Anxious;Preoccupied  Thought Process  Coherency Circumstantial  Content WDL  Delusions None reported or observed  Perception WDL  Hallucination None reported or observed  Judgment WDL  Confusion None  Danger to Self  Current suicidal ideation? Denies  Danger to Others  Danger to Others None reported or observed

## 2021-08-22 NOTE — Group Note (Signed)
Recreation Therapy Group Note   Group Topic:Leisure Education  Group Date: 08/22/2021 Start Time: 1000 End Time: 1035 Facilitators: Victorino Sparrow, LRT,CTRS Location: [address] Dayroom   Goal Area(s) Addresses:  Patient will identify positive leisure and recreation activities.  Patient will identify one positive benefit of participation in leisure activities.   Group Description:   Keep It Going Volleyball.  Patients were placed in a circle.  Patients were given a beach ball, which they were to pass or bounce to each without the ball coming to a complete stop.  LRT would time the group to see how long they could keep the ball in motion.  If at any point the ball came to a stop, the time would start over.   Affect/Mood: N/A   Participation Level: Did not attend    Clinical Observations/Individualized Feedback:     Plan: Continue to engage patient in RT group sessions 2-3x/week.   Victorino Sparrow, Glennis Brink 08/22/2021 12:33 PM

## 2021-08-22 NOTE — Progress Notes (Signed)
[facility] MD Progress Note  08/22/2021 11:35 AM [name]  MRN:  [id number] Subjective:  [name] is a 50 year old female with a psychiatric history of schizoaffective disorder-bipolar type and a medical history of asthma and dyslipidemia who presented voluntarily and admitted for disorganized thoughts and paranoia.  Chart Review of Past 24 hrs: The patient's chart was reviewed and nursing notes were reviewed. The patient's case was discussed in multidisciplinary team meeting.  Per MAR: - Patient is compliant with scheduled meds. - PRNs: None Per RN notes, no documented behavioral issues and has been attending some groups. Patient sleep hours not documented  Patient had the following psychiatric recommendations yesterday:  - Continue Depakote ER 1000 mg nightly for mood stabilization (started 12/16; VPA level 77 on 08-16-2021,WBC 5.5, platelets 273, and H/H 11.3/35.2 (c/w baseline anemia) on 12/20, will recheck LFTs at next lab draw 12/27) -Zyprexa discontinued, due to initiation of Clozaril, as patient was unbalanced with 10 mg Zyprexa.  ACT team confirmed that patient did receive Invega sustenna 269m LAI on 12/6,  patient has failed the Invega as monotherapy.  Labs ordered: echocardiogram obtained shows LVEF 60% with grade 1 diastolic dysfunction; CBC with differential- Hgb 11.3, fasting lipid panel- HDL 40, VPA level- 77, troponin- 2, CRP- 0.9, ESR- 42, BNP- 5.1, A1c 5.8%, EKG unremarkable, and beta hCG negative.  Will continue to trend ESR, troponin, CBC, and CRP weekly (next check 12/27). --Continue increasing Clozaril by 12.553mdaily. Today's daily total of 50 mg. -Continue Colace 100 mg for bowel regimen while on clozapine.  On Today's Assessment (08/22/2021): Case was discussed in the multidisciplinary team. MAR was reviewed and patient was compliant with medications.   She reports she is feeling "okay" today, but anxious because her daughter got into a car accident yesterday. She reports  sleeping well last night and has a stable appetite.  She denies any depression or anxiety.  She is a bit more tangential and disorganized today on assessment, although she continues to answer questions appropriately with pauses after answering.  She has been tolerating her medications well and denies any side effects today.  She denies all other somatic symptoms. She denies SI/HI/AVH,ideas of reference, and paranoia on the unit.   Review of symptoms, specific for clozapine: Malaise:Denies Chest pain: Denies Shortness of breath: Denies Exertional capacity: Normal, "winded" with increased activity Tachycardia: Reports denies palpitations subjectively, pulse 102 this a.m. Cough: Denies Fever: Denies Sedation: Denies sedation today Orthostatic hypotension (dizziness with standing): Denies today Hypersalivation: some baseline drooling, denies today Constipation: Denies Symptoms of GERD: Denies Nausea: Denies Nocturnal enuresis: Denies   Principal Problem: Schizoaffective disorder, bipolar type ([place]stown[place]osis: Principal Problem:   Schizoaffective disorder, bipolar type (HC[place] EraActive Problems:   Prediabetes   Essential hypertension   Past Psychiatric History: See H&P  Past Medical History:  Past Medical History:  Diagnosis Date   Anxiety    Asthma    Bipolar 1 disorder ([place]   Depression    Gallstones 08/2020   Hypertension    Insomnia, persistent    Prediabetes    Schizophrenic disorder (HC[place]   Seizures ([place]    Past Surgical History:  Procedure Laterality Date   BREAST LUMPECTOMY Left 07/2013   TONSILLECTOMY     TUBAL LIGATION  2002   Family History:  Family History  Problem Relation Age of Onset   Depression Mother    Gout Mother    Cancer Father        prostate  Other Father        lung issue   Alcoholism Other    Heart attack Paternal Grandfather    Heart attack Paternal Grandmother    Heart attack Maternal Grandmother    Heart attack Maternal  Grandfather    Depression Son    Anxiety disorder Son    Family Psychiatric  History: See H&P Social History:  Social History   Substance and Sexual Activity  Alcohol Use Not Currently     Social History   Substance and Sexual Activity  Drug Use No    Social History   Socioeconomic History   Marital status: Single    Spouse name: Not on file   Number of children: Not on file   Years of education: Not on file   Highest education level: Not on file  Occupational History   Not on file  Tobacco Use   Smoking status: Former    Types: Cigarettes   Smokeless tobacco: Never  Vaping Use   Vaping Use: Never used  Substance and Sexual Activity   Alcohol use: Not Currently   Drug use: No   Sexual activity: Not Currently  Other Topics Concern   Not on file  Social History Narrative   Not on file   Social Determinants of Health   Financial Resource Strain: Not on file  Food Insecurity: Food Insecurity Present   Worried About [place] in the Last Year: Sometimes true   Ran Out of Food in the Last Year: Sometimes true  Transportation Needs: No Transportation Needs   Lack of Transportation (Medical): No   Lack of Transportation (Non-Medical): No  Physical Activity: Not on file  Stress: Not on file  Social Connections: Not on file   Additional Social History:         Sleep: Good  Appetite:  Good  Current Medications: Current Facility-Administered Medications  Medication Dose Route Frequency Provider Last Rate Last Admin   albuterol (VENTOLIN HFA) 108 (90 Base) MCG/ACT inhaler 1-2 puff  1-2 puff Inhalation Q4H PRN Rosezetta Schlatter, MD       ascorbic acid (VITAMIN C) tablet 1,000 mg  1,000 mg Oral Daily Suella Broad, FNP   1,000 mg at 08/22/21 0732   atorvastatin (LIPITOR) tablet 10 mg  10 mg Oral Daily Suella Broad, FNP   10 mg at 08/22/21 0732   cloZAPine (CLOZARIL) tablet 37.5 mg  37.5 mg Oral BID Rosezetta Schlatter, MD       divalproex  (DEPAKOTE ER) 24 hr tablet 1,000 mg  1,000 mg Oral QHS Rosezetta Schlatter, MD   1,000 mg at 08/21/21 2100   docusate sodium (COLACE) capsule 100 mg  100 mg Oral Daily Rosezetta Schlatter, MD   100 mg at 08/22/21 0732   hydrOXYzine (ATARAX) tablet 25 mg  25 mg Oral TID Suella Broad, FNP   25 mg at 08/22/21 0732   hydrOXYzine (ATARAX) tablet 50 mg  50 mg Oral QHS PRN Rosezetta Schlatter, MD   50 mg at 08/19/21 2104   ibuprofen (ADVIL) tablet 400 mg  400 mg Oral Q6H PRN Suella Broad, FNP   400 mg at 08/16/21 1139   magnesium hydroxide (MILK OF MAGNESIA) suspension 30 mL  30 mL Oral Daily PRN Suella Broad, FNP       melatonin tablet 3 mg  3 mg Oral QHS Rosezetta Schlatter, MD   3 mg at 08/21/21 2100   metFORMIN (GLUCOPHAGE) tablet 500 mg  500  mg Oral Q breakfast Rosezetta Schlatter, MD   500 mg at 08/22/21 0732   mometasone-formoterol (DULERA) 200-5 MCG/ACT inhaler 2 puff  2 puff Inhalation BID Suella Broad, FNP   2 puff at 08/22/21 0734   simethicone (MYLICON) chewable tablet 80 mg  80 mg Oral QID PRN Rosezetta Schlatter, MD       tuberculin injection 5 Units  5 Units Intradermal Once Rosezetta Schlatter, MD        Lab Results:    Blood Alcohol level:  Lab Results  Component Value Date   [facility] <10 08/11/2021   ETH <10 25/95/6387    Metabolic Disorder Labs: Lab Results  Component Value Date   HGBA1C 5.8 (H) 08/12/2021   MPG 119.76 08/12/2021   MPG 125.5 04/27/2021   Lab Results  Component Value Date   PROLACTIN 74.6 (H) 09/28/2019   PROLACTIN 4.2 (L) 12/19/2018   Lab Results  Component Value Date   CHOL 125 08/16/2021   TRIG 54 08/16/2021   HDL 40 (L) 08/16/2021   CHOLHDL 3.1 08/16/2021   VLDL 11 08/16/2021   LDLCALC 74 08/16/2021   LDLCALC 95 08/12/2021    Physical Findings: AIMS: Facial and Oral Movements Muscles of Facial Expression: None, normal Lips and Perioral Area: None, normal Jaw: None, normal Tongue: None, normal,Extremity Movements Upper (arms,  wrists, hands, fingers): None, normal Lower (legs, knees, ankles, toes): None, normal, Trunk Movements Neck, shoulders, hips: None, normal, Overall Severity Severity of abnormal movements (highest score from questions above): None, normal Incapacitation due to abnormal movements: None, normal [name] awareness of abnormal movements (rate only patient's report): No Awareness, Dental Status Current problems with teeth and/or dentures?: No Does patient usually wear dentures?: No    Musculoskeletal: Strength & Muscle Tone: within normal limits Gait & Station: normal Patient leans: N/A  Psychiatric Specialty Exam:  Presentation  General Appearance: Appropriate for Environment  Eye Contact:Fair  Speech:mumbling quality, normal rate and fluency  Speech Volume:Normal  Handedness:Right   Mood and Affect  Mood: Euthymic, less tired  Affect:constricted, but with more range   Thought Process  Thought Processes:Tangential  Descriptions of Associations:Less Tangential initially, more tangential on reassessment, but remains less tangential overall  Orientation: Oriented to person, place, and time  Thought Content: Denies SI/HI/AVH, paranoia, and ideas of reference or first rank symptoms on the unit.  Does not voice delusions.   History of Schizophrenia/Schizoaffective disorder:Yes  Duration of Psychotic Symptoms:Greater than six months  Hallucinations: Denies  Ideas of Reference:None  Suicidal Thoughts:Denies  Homicidal Thoughts:Denies  Sensorium  Memory:Immediate Good; Recent Good  Judgment:Fair  Insight:Fair (Improving)   Executive Functions  Concentration: Fair, improving  Attention Span: Fair, improving  [place]   Psychomotor Activity  Psychomotor Activity: less sedated appearing on morning assessment; a bit more sedated later in the day.   Assets  Assets:Housing; Leisure Time   Sleep  Number of  Hours of Sleep: Not documented   Physical Exam Vitals and nursing note reviewed.  Constitutional:      General: She is not in acute distress.    Appearance: Normal appearance. She is not toxic-appearing.  HENT:     Head: Normocephalic and atraumatic.  Pulmonary:     Effort: Pulmonary effort is normal.  Skin:    General: Skin is warm and dry.  Neurological:     General: No focal deficit present.     Mental Status: She is alert and oriented to person, place, and time.  Motor: No weakness.   Review of Systems  Constitutional:  Negative for chills, fever and malaise/fatigue.       Increased sedation  Respiratory:  Negative for shortness of breath.   Cardiovascular:  Negative for chest pain and palpitations.  Gastrointestinal:  Negative for diarrhea, heartburn, nausea and vomiting.  Genitourinary:  Negative for dysuria, flank pain, frequency and urgency.  Neurological:  Negative for dizziness, focal weakness and headaches.  Psychiatric/Behavioral:  Negative for depression, hallucinations and suicidal ideas. The patient is not nervous/anxious.   All other systems reviewed and are negative. Blood pressure (!) 111/99, pulse (!) 102, temperature 98.1 F (36.7 C), temperature source Oral, resp. rate 16, height '5\' 8"'  (1.727 m), weight 117.5 kg, SpO2 99 %. Body mass index is 39.38 kg/m.   ASSESSMENT: Principal Problem:   Schizoaffective disorder, bipolar type ([place]) Active Problems:   Prediabetes   Essential hypertension  Treatment Plan Summary: Daily contact with patient to assess and evaluate symptoms and progress in treatment and Medication management     Safety and Monitoring: voluntarily admission to inpatient psychiatric unit for safety, stabilization and treatment Daily contact with patient to assess and evaluate symptoms and progress in treatment [name] case to be discussed in multi-disciplinary team meeting Observation Level : q15 minute checks Vital signs: q12  hours Precautions: suicide, elopement, and assault   2.   Psychiatric diagnosis #Schizoaffective disorder-bipolar type #PTSD - Continue Depakote ER 1000 mg nightly for mood stabilization (started 12/16; VPA level 77 on 08-16-2021,WBC 5.5, platelets 273, and H/H 11.3/35.2 (c/w baseline anemia) on 12/20, will recheck LFTs at next lab draw 12/27) -Zyprexa discontinued, due to initiation of Clozaril, as patient was unbalanced with 10 mg Zyprexa.  ACT team confirmed that patient did receive Invega sustenna 261m LAI on 12/6,  patient has failed the Invega as monotherapy.  Labs ordered: echocardiogram obtained shows LVEF 60% with grade 1 diastolic dysfunction; CBC with differential- Hgb 11.3, fasting lipid panel- HDL 40, VPA level- 77, troponin- 2, CRP- 0.9, ESR- 42, BNP- 5.1, A1c 5.8%, EKG unremarkable, and beta hCG negative.  Will continue to trend ESR, troponin, CBC, and CRP weekly (next check 12/27). --Continue increasing Clozaril by 12.558mdaily. Today's daily total of 62.5 mg.  -Continue Colace 100 mg for bowel regimen while on clozapine.   3. Medical Management: On admission Covid negative CMP: Unremarkable CBC: Hgb 10.9 EtOH: <10 UDS: Negative TSH: 1.276 A1C: 5.8% Lipids: Unremarkable   #Prediabetes #History of dyslipidemia - Continue metformin 500 mg once daily for prediabetes and prevention of antipsychotic induced metabolic syndrome - Continue home atorvastatin 10 mg daily  #Asthma - Continue home Dulera 2 puffs twice daily  Discharge Planning:              -- Social work and case management to assist with discharge planning and identification of hospital follow-up needs prior to discharge.              -- Estimated LOS: 10-14 days; pending labs for initiation of clozaril and additional admission time required to titrate medication.             -- Discharge Concerns: Need to establish a safety plan; Medication compliance and effectiveness             -- Discharge Goals: Return  home with outpatient referrals for mental health follow-up including medication management/psychotherapy   CoRosezetta SchlatterMD  PGY-1 08/22/2021, 11:35 AM

## 2021-08-22 NOTE — Group Note (Signed)
LCSW Group Therapy Note   Group Date: 08/22/2021 Start Time: 1300 End Time: 1330  LCSW Aftercare Discharge Planning Group Note  08/22/2021   Type of Group and Topic: Psychoeducational Group: Discharge Planning  Participation Level: Active  Description of Group  Discharge planning group reviews patients anticipated discharge plans and assists patients to anticipate and address any barriers to wellness/recovery in the community. Suicide prevention education is reviewed with patients in group.  Therapeutic Goals  1. Patients will state their anticipated discharge plan and mental health aftercare  2. Patients will identify potential barriers to wellness in the community setting  3. Patients will engage in problem solving, solution focused discussion of ways to anticipate and address barriers to wellness/recovery  Summary of Patient Progress: CSW completed 1:1 discharge planning with pt. Pt was able to ask CSW questions during this time.   Plan for Discharge/Comments: Pt will go to a group home arranged by her [name] team.   Transportation Means: [name] team or Safe Transport  Supports: [name] team  Therapeutic Modalities: Motivational Interviewing  Mliss Fritz, Latanya Presser 08/22/2021  1:05 PM

## 2021-08-22 NOTE — BH IP Treatment Plan (Signed)
Interdisciplinary Treatment and Diagnostic Plan Update  08/22/2021 [name] MRN: [id number]  Principal Diagnosis: Schizoaffective disorder, bipolar type [name])  Secondary Diagnoses: Principal Problem:   Schizoaffective disorder, bipolar type ([place]) Active Problems:   Prediabetes   Essential hypertension   Current Medications:  Current Facility-Administered Medications  Medication Dose Route Frequency Provider Last Rate Last Admin   albuterol (VENTOLIN HFA) 108 (90 Base) MCG/ACT inhaler 1-2 puff  1-2 puff Inhalation Q4H PRN Rosezetta Schlatter, MD       ascorbic acid (VITAMIN C) tablet 1,000 mg  1,000 mg Oral Daily Suella Broad, FNP   1,000 mg at 08/22/21 0732   atorvastatin (LIPITOR) tablet 10 mg  10 mg Oral Daily Suella Broad, FNP   10 mg at 08/22/21 0732   cloZAPine (CLOZARIL) tablet 37.5 mg  37.5 mg Oral BID Rosezetta Schlatter, MD       divalproex (DEPAKOTE ER) 24 hr tablet 1,000 mg  1,000 mg Oral QHS Rosezetta Schlatter, MD   1,000 mg at 08/21/21 2100   docusate sodium (COLACE) capsule 100 mg  100 mg Oral Daily Rosezetta Schlatter, MD   100 mg at 08/22/21 0732   hydrOXYzine (ATARAX) tablet 25 mg  25 mg Oral TID Suella Broad, FNP   25 mg at 08/22/21 0732   hydrOXYzine (ATARAX) tablet 50 mg  50 mg Oral QHS PRN Rosezetta Schlatter, MD   50 mg at 08/19/21 2104   ibuprofen (ADVIL) tablet 400 mg  400 mg Oral Q6H PRN Suella Broad, FNP   400 mg at 08/16/21 1139   magnesium hydroxide (MILK OF MAGNESIA) suspension 30 mL  30 mL Oral Daily PRN Suella Broad, FNP       melatonin tablet 3 mg  3 mg Oral QHS Rosezetta Schlatter, MD   3 mg at 08/21/21 2100   metFORMIN (GLUCOPHAGE) tablet 500 mg  500 mg Oral Q breakfast Rosezetta Schlatter, MD   500 mg at 08/22/21 0732   mometasone-formoterol (DULERA) 200-5 MCG/ACT inhaler 2 puff  2 puff Inhalation BID Suella Broad, FNP   2 puff at 08/22/21 0734   simethicone (MYLICON) chewable tablet 80 mg  80 mg Oral QID PRN Rosezetta Schlatter, MD       tuberculin injection 5 Units  5 Units Intradermal Once Rosezetta Schlatter, MD       PTA Medications: Medications Prior to Admission  Medication Sig Dispense Refill Last Dose   Ascorbic Acid (VITAMIN C WITH ROSE HIPS) 500 MG tablet Take 1,000 mg by mouth daily.      atorvastatin (LIPITOR) 10 MG tablet Take 10 mg by mouth daily.      budesonide-formoterol (SYMBICORT) 160-4.5 MCG/ACT inhaler Inhale 2 puffs into the lungs 2 (two) times daily. For Shortness of breath 1 Inhaler 0    hydrOXYzine (VISTARIL) 25 MG capsule Take 25 mg by mouth 3 (three) times daily.      ibuprofen (ADVIL) 200 MG tablet Take 400 mg by mouth every 6 (six) hours as needed for moderate pain or headache.      paliperidone (INVEGA SUSTENNA) 234 MG/1.5ML SUSY injection Inject 234 mg into the muscle once for 1 dose. (Patient taking differently: Inject 234 mg into the muscle every 21 ( twenty-one) days.) 1.5 mL 1    Pseudoephedrine-APAP-DM (DAYQUIL PO) Take 1 tablet by mouth at bedtime.      traZODone (DESYREL) 100 MG tablet Take 100 mg by mouth at bedtime as needed for sleep.  Patient Stressors: Financial difficulties   Medication change or noncompliance    Patient Strengths: Ability for insight  Average or above average intelligence  Capable of independent living  Supportive family/friends   Treatment Modalities: Medication Management, Group therapy, Case management,  1 to 1 session with clinician, Psychoeducation, Recreational therapy.   Physician Treatment Plan for Primary Diagnosis: Schizoaffective disorder, bipolar type ([place]) Long Term Goal(s): Improvement in symptoms so as ready for discharge   Short Term Goals: Ability to identify changes in lifestyle to reduce recurrence of condition will improve Ability to verbalize feelings will improve Ability to demonstrate self-control will improve Ability to maintain clinical measurements within normal limits will improve Compliance with prescribed  medications will improve Ability to identify and develop effective coping behaviors will improve  Medication Management: Evaluate patient's response, side effects, and tolerance of medication regimen.  Therapeutic Interventions: 1 to 1 sessions, Unit Group sessions and Medication administration.  Evaluation of Outcomes: Progressing  Physician Treatment Plan for Secondary Diagnosis: Principal Problem:   Schizoaffective disorder, bipolar type ([place]) Active Problems:   Prediabetes   Essential hypertension  Long Term Goal(s): Improvement in symptoms so as ready for discharge   Short Term Goals: Ability to identify changes in lifestyle to reduce recurrence of condition will improve Ability to verbalize feelings will improve Ability to demonstrate self-control will improve Ability to maintain clinical measurements within normal limits will improve Compliance with prescribed medications will improve Ability to identify and develop effective coping behaviors will improve     Medication Management: Evaluate patient's response, side effects, and tolerance of medication regimen.  Therapeutic Interventions: 1 to 1 sessions, Unit Group sessions and Medication administration.  Evaluation of Outcomes: Progressing   RN Treatment Plan for Primary Diagnosis: Schizoaffective disorder, bipolar type ([place]) Long Term Goal(s): Knowledge of disease and therapeutic regimen to maintain health will improve  Short Term Goals: Ability to participate in decision making will improve, Ability to verbalize feelings will improve, and Ability to identify and develop effective coping behaviors will improve  Medication Management: RN will administer medications as ordered by provider, will assess and evaluate patient's response and provide education to patient for prescribed medication. RN will report any adverse and/or side effects to prescribing provider.  Therapeutic Interventions: 1 on 1 counseling sessions,  Psychoeducation, Medication administration, Evaluate responses to treatment, Monitor vital signs and CBGs as ordered, Perform/monitor CIWA, COWS, AIMS and Fall Risk screenings as ordered, Perform wound care treatments as ordered.  Evaluation of Outcomes: Progressing   LCSW Treatment Plan for Primary Diagnosis: Schizoaffective disorder, bipolar type ([place]) Long Term Goal(s): Safe transition to appropriate next level of care at discharge, Engage patient in therapeutic group addressing interpersonal concerns.  Short Term Goals: Engage patient in aftercare planning with referrals and resources, Increase social support, and Increase ability to appropriately verbalize feelings  Therapeutic Interventions: Assess for all discharge needs, 1 to 1 time with Social worker, Explore available resources and support systems, Assess for adequacy in community support network, Educate family and significant other(s) on suicide prevention, Complete Psychosocial Assessment, Interpersonal group therapy.  Evaluation of Outcomes: Progressing   Progress in Treatment: Attending groups: Yes. Participating in groups: Yes. Taking medication as prescribed: Yes. Toleration medication: Yes. Family/Significant other contact made: Yes, individual(s) contacted:  daughter Patient understands diagnosis: Yes. Discussing patient identified problems/goals with staff: Yes. Medical problems stabilized or resolved: Yes. Denies suicidal/homicidal ideation: Yes. Issues/concerns per patient self-inventory: No. Other: None  New problem(s) identified: No, Describe:  None  New Short Term/Long  Term Goal(s):medication stabilization, elimination of SI thoughts, development of comprehensive mental wellness plan.   Patient Goals:  "[facility]"  Discharge Plan or Barriers: Pt will continue services with [facility] [name] post-discharge.   Reason for Continuation of Hospitalization: Medication stabilization  Estimated Length  of Stay: 3-5 days   Scribe for Treatment Team: Eliott Nine 08/22/2021 10:26 AM

## 2021-08-23 ENCOUNTER — Other Ambulatory Visit: Payer: Self-pay

## 2021-08-23 LAB — HEPATIC FUNCTION PANEL
ALT: 9 U/L (ref 0–44)
AST: 12 U/L — ABNORMAL LOW (ref 15–41)
Albumin: 3.8 g/dL (ref 3.5–5.0)
Alkaline Phosphatase: 66 U/L (ref 38–126)
Bilirubin, Direct: <0.1 mg/dL (ref 0.0–0.2)
Total Bilirubin: 0.4 mg/dL (ref 0.3–1.2)
Total Protein: 7.7 g/dL (ref 6.5–8.1)

## 2021-08-23 LAB — CBC WITH DIFFERENTIAL/PLATELET
Abs Immature Granulocytes: 0.04 10*3/uL (ref 0.00–0.07)
Basophils Absolute: 0 10*3/uL (ref 0.0–0.1)
Basophils Relative: 1 %
Eosinophils Absolute: 0.3 10*3/uL (ref 0.0–0.5)
Eosinophils Relative: 5 %
HCT: 38.1 % (ref 36.0–46.0)
Hemoglobin: 11.9 g/dL — ABNORMAL LOW (ref 12.0–15.0)
Immature Granulocytes: 1 %
Lymphocytes Relative: 35 %
Lymphs Abs: 2.4 10*3/uL (ref 0.7–4.0)
MCH: 28.3 pg (ref 26.0–34.0)
MCHC: 31.2 g/dL (ref 30.0–36.0)
MCV: 90.5 fL (ref 80.0–100.0)
Monocytes Absolute: 0.4 10*3/uL (ref 0.1–1.0)
Monocytes Relative: 7 %
Neutro Abs: 3.5 10*3/uL (ref 1.7–7.7)
Neutrophils Relative %: 51 %
Platelets: 271 10*3/uL (ref 150–400)
RBC: 4.21 MIL/uL (ref 3.87–5.11)
RDW: 13.8 % (ref 11.5–15.5)
WBC: 6.7 10*3/uL (ref 4.0–10.5)
nRBC: 0 % (ref 0.0–0.2)

## 2021-08-23 LAB — SEDIMENTATION RATE: Sed Rate: 30 mm/hr — ABNORMAL HIGH (ref 0–22)

## 2021-08-23 LAB — C-REACTIVE PROTEIN: CRP: 0.8 mg/dL (ref <1.0)

## 2021-08-23 LAB — TROPONIN I (HIGH SENSITIVITY): Troponin I (High Sensitivity): 3 ng/L (ref <18)

## 2021-08-23 MED ORDER — CLOZAPINE 25 MG PO TABS
37.5000 mg | ORAL_TABLET | Freq: Once | ORAL | Status: AC
  Administered 2021-08-23: 15:00:00 37.5 mg via ORAL
  Filled 2021-08-23 (×2): qty 2

## 2021-08-23 MED ORDER — CLOZAPINE 25 MG PO TABS
37.5000 mg | ORAL_TABLET | Freq: Two times a day (BID) | ORAL | Status: DC
  Filled 2021-08-23 (×4): qty 2

## 2021-08-23 MED ORDER — CLOZAPINE 25 MG PO TABS: 50.0000 mg | ORAL_TABLET | Freq: Two times a day (BID) | ORAL | Status: DC

## 2021-08-23 MED ORDER — CLOZAPINE 25 MG PO TABS
75.0000 mg | ORAL_TABLET | Freq: Every day | ORAL | Status: DC
  Administered 2021-08-24: 21:00:00 75 mg via ORAL
  Filled 2021-08-23 (×2): qty 3

## 2021-08-23 MED ORDER — CLOZAPINE 25 MG PO TABS: 37.5000 mg | ORAL_TABLET | Freq: Two times a day (BID) | ORAL | Status: DC

## 2021-08-23 MED ORDER — CLOZAPINE 25 MG PO TABS
50.0000 mg | ORAL_TABLET | Freq: Once | ORAL | Status: AC
  Administered 2021-08-23: 21:00:00 50 mg via ORAL
  Filled 2021-08-23: qty 2

## 2021-08-23 MED ORDER — CLOZAPINE 25 MG PO TABS: 50.0000 mg | ORAL_TABLET | Freq: Every day | ORAL | Status: DC

## 2021-08-23 MED ORDER — CLOZAPINE 25 MG PO TABS: 25.0000 mg | ORAL_TABLET | Freq: Every day | ORAL | Status: DC

## 2021-08-23 MED ORDER — HYDROXYZINE HCL 25 MG PO TABS
25.0000 mg | ORAL_TABLET | Freq: Three times a day (TID) | ORAL | Status: DC | PRN
  Administered 2021-08-23: 17:00:00 25 mg via ORAL
  Filled 2021-08-23: qty 1

## 2021-08-23 MED ORDER — CLOZAPINE 25 MG PO TABS
25.0000 mg | ORAL_TABLET | Freq: Every day | ORAL | Status: DC
  Administered 2021-08-24 – 2021-08-25 (×2): 25 mg via ORAL
  Filled 2021-08-23 (×3): qty 1

## 2021-08-23 NOTE — Group Note (Signed)
Recreation Therapy Group Note   Group Topic:Team Building  Group Date: 08/23/2021 Start Time: 5521 End Time: 1028 Facilitators: Victorino Sparrow, LRT,CTRS Location: [address] Dayroom   Goal Area(s) Addresses:  Patient will effectively work with peer towards shared goal.  Patient will identify skills used to make activity successful.  Patient will identify how skills used during activity can be used to reach post d/c goals.   Group Description: Landing Pad. In teams of 3-5, patients were given 12 plastic drinking straws and an equal length of masking tape. Using the materials provided, patients were asked to build a landing pad to catch a golf ball dropped from approximately 5 feet in the air. All materials were required to be used by the team in their design. LRT facilitated post-activity discussion.   Affect/Mood: Appropriate   Participation Level: None   Participation Quality: Observant   Behavior: Appropriate   Speech/Thought Process: Relevant   Insight: Moderate   Judgement: Moderate   Modes of Intervention: Team-building   Patient Response to Interventions:  Attentive   Education Outcome:  Acknowledges education and In group clarification offered    Clinical Observations/Individualized Feedback: Pt came into group as peers were finishing completion of their landing pads.  Pt mentioned during processing using the skills of listening and understanding.  For the most part, pt was attentive.     Plan: Continue to engage patient in RT group sessions 2-3x/week.   Victorino Sparrow, LRT,CTRS 08/23/2021 12:11 PM

## 2021-08-23 NOTE — Progress Notes (Signed)
°   08/23/21 2100  Psych Admission Type (Psych Patients Only)  Admission Status Voluntary  Psychosocial Assessment  Patient Complaints Anxiety;Worrying  Eye Contact Fair  Facial Expression Anxious  Affect Anxious;Appropriate to circumstance  Speech Logical/coherent  Interaction Assertive  Motor Activity Slow  Appearance/Hygiene In hospital gown  Behavior Characteristics Cooperative  Mood Anxious  Thought Process  Coherency Circumstantial  Content WDL  Delusions None reported or observed  Perception WDL  Hallucination None reported or observed  Judgment WDL  Confusion None  Danger to Self  Current suicidal ideation? Denies  Danger to Others  Danger to Others None reported or observed

## 2021-08-23 NOTE — Progress Notes (Signed)
D:  Patient has been in bed most of the day.  Patient denied SI and HI, contracts for safety.  Denied A/V hallucinations.  A:  Medications administered per MD orders.  Emotional support and encouragement given patient. R:  Safety maintained with 15 minute checks.

## 2021-08-23 NOTE — Plan of Care (Signed)
Nurse discussed coping skills with patient.  

## 2021-08-23 NOTE — Progress Notes (Signed)
°   08/23/21 0500  Sleep  Number of Hours 8.75

## 2021-08-23 NOTE — Progress Notes (Addendum)
[facility] MD Progress Note  08/23/2021 7:30 AM [name]  MRN:  [id number] Subjective:  [name] is a 50 year old female with a psychiatric [name] of schizoaffective disorder-bipolar type and a medical [name] of asthma and dyslipidemia who presented voluntarily and admitted for disorganized thoughts and paranoia.  Chart Review of Past 24 hrs: The patient's chart was reviewed and nursing notes were reviewed. The patient's case was discussed in multidisciplinary team meeting.  Per MAR: - Patient is compliant with scheduled meds. - PRNs: None Per RN notes, no documented behavioral issues and has been attending some groups. Patient sleep hours not documented  Patient had the following psychiatric recommendations yesterday:  - Continue Depakote ER 1000 mg nightly for mood stabilization (started 12/16; VPA level 77 on 08-16-2021,WBC 5.5, platelets 273, and H/H 11.3/35.2 (c/w baseline anemia) on 12/20, will recheck LFTs at next lab draw 12/27) -Zyprexa discontinued, due to initiation of Clozaril, as patient was unbalanced with 10 mg Zyprexa.  ACT team confirmed that patient did receive Invega sustenna 235m LAI on 12/6,  patient has failed the Invega as monotherapy.  Labs ordered: echocardiogram obtained shows LVEF 60% with grade 1 diastolic dysfunction; CBC with differential- Hgb 11.3, fasting lipid panel- HDL 40, VPA level- 77, troponin- 2, CRP- 0.9, ESR- 42, BNP- 5.1, A1c 5.8%, EKG unremarkable, and beta hCG negative.  Will continue to trend ESR, troponin, CBC, and CRP weekly (next check 12/27). --Continue increasing Clozaril by 12.548mdaily. Today's daily total of 62.5 mg.  -Continue Colace 100 mg for bowel regimen while on clozapine.  On Today's Assessment (08/23/2021): Case was discussed in the multidisciplinary team. MAR was reviewed and patient was compliant with medications.   She reports she is feeling "okay but tired" today, which she attributes to taking additional hydroxyzine for sleep.  She reports sleeping well last night and has a stable appetite.  She denies any depression or anxiety.  She is a bit more tangential today on assessment, largely unchanged from yesterday, but more paranoid and disorganized in speech.  Although, she continues to answer questions appropriately with pauses after answering.  She has been tolerating her medications well and denies any side effects today.  She denies all other somatic symptoms. She denies SI/HI/AVH,ideas of reference, and paranoia on the unit.  Patient is aware that she has group home placement, and she looks forward to going.   Review of symptoms, specific for clozapine: Malaise:Denies Chest pain: Denies Shortness of breath: Denies Exertional capacity: Normal, "winded" with increased activity Tachycardia: Reports denies palpitations subjectively, pulse 100 this a.m. Cough: Denies Fever: Denies Sedation: Denies sedation today Orthostatic hypotension (dizziness with standing): Denies today Hypersalivation: some baseline drooling, denies today Constipation: Denies Symptoms of GERD: Denies Nausea: Denies Nocturnal enuresis: Denies   Principal Problem: Schizoaffective disorder, bipolar type ([name]: Principal Problem:   Schizoaffective disorder, bipolar type (Yvette Problems:   Prediabetes   Essential hypertension   Past Psychiatric [name]: See H&P  Past Medical [name]:  Past Medical [name]:  Diagnosis Date   Anxiety    Asthma    Bipolar 1 disorder ([place]   Depression    Gallstones 08/2020   Hypertension    Insomnia, persistent    Prediabetes    Schizophrenic disorder ([name] ([name]:  Procedure [name] Date   BREAST LUMPECTOMY Left 07/2013   TONSILLECTOMY     TUBAL LIGATION  2002   Family [name]:  Family [name]  Problem  Relation Age of Onset   Depression Mother    Gout Mother    Cancer Father        prostate   Other Father        lung issue    Alcoholism Other    Heart attack Paternal Grandfather    Heart attack Paternal Grandmother    Heart attack Maternal Grandmother    Heart attack Maternal Grandfather    Depression Son    Anxiety disorder Son    Family Psychiatric  [name]: See H&P Social [name]:  Social [name]   Substance and Sexual Activity  Alcohol Use Not Currently     Social [name]   Substance and Sexual Activity  Drug Use No    Social [name]   Socioeconomic [name]   Marital status: Single    Spouse name: Not on file   Number of children: Not on file   Years of education: Not on file   Highest education level: Not on file  Occupational [name]   Not on file  Tobacco Use   Smoking status: Former    Types: Cigarettes   Smokeless tobacco: Never  Vaping Use   Vaping Use: Never used  Substance and Sexual Activity   Alcohol use: Not Currently   Drug use: No   Sexual activity: Not Currently  Other Topics Concern   Not on file  Social [name] Narrative   Not on file   Social Determinants of Health   Financial Resource Strain: Not on file  Food Insecurity: Food Insecurity Present   Worried About [place] in the Last Year: Sometimes true   [place] in the Last Year: Sometimes true  Transportation Needs: No Transportation Needs   Lack of Transportation (Medical): No   Lack of Transportation (Non-Medical): No  Physical Activity: Not on file  Stress: Not on file  Social Connections: Not on file   Additional Social [name]:         Sleep: Good  Appetite:  Good  Current Medications: Current Facility-Administered Medications  Medication Dose Route Frequency Provider Last Rate Last Admin   albuterol (VENTOLIN HFA) 108 (90 Base) MCG/ACT inhaler 1-2 puff  1-2 puff Inhalation Q4H PRN Rosezetta Schlatter, MD       ascorbic acid (VITAMIN C) tablet 1,000 mg  1,000 mg Oral Daily Suella Broad, FNP   1,000 mg at 08/22/21 0732   atorvastatin (LIPITOR) tablet 10 mg  10 mg  Oral Daily Suella Broad, FNP   10 mg at 08/22/21 0732   cloZAPine (CLOZARIL) tablet 37.5 mg  37.5 mg Oral BID Rosezetta Schlatter, MD   37.5 mg at 08/22/21 2041   divalproex (DEPAKOTE ER) 24 hr tablet 1,000 mg  1,000 mg Oral QHS Rosezetta Schlatter, MD   1,000 mg at 08/22/21 2041   docusate sodium (COLACE) capsule 100 mg  100 mg Oral Daily Rosezetta Schlatter, MD   100 mg at 08/22/21 0732   hydrOXYzine (ATARAX) tablet 25 mg  25 mg Oral TID Suella Broad, FNP   25 mg at 08/22/21 1707   hydrOXYzine (ATARAX) tablet 50 mg  50 mg Oral QHS PRN Rosezetta Schlatter, MD   50 mg at 08/22/21 2040   ibuprofen (ADVIL) tablet 400 mg  400 mg Oral Q6H PRN Suella Broad, FNP   400 mg at 08/16/21 1139   magnesium hydroxide (MILK OF MAGNESIA) suspension 30 mL  30 mL Oral Daily PRN Suella Broad, FNP  melatonin tablet 3 mg  3 mg Oral QHS Rosezetta Schlatter, MD   3 mg at 08/22/21 2040   metFORMIN (GLUCOPHAGE) tablet 500 mg  500 mg Oral Q breakfast Rosezetta Schlatter, MD   500 mg at 08/22/21 0732   mometasone-formoterol (DULERA) 200-5 MCG/ACT inhaler 2 puff  2 puff Inhalation BID Suella Broad, FNP   2 puff at 08/22/21 0734   simethicone (MYLICON) chewable tablet 80 mg  80 mg Oral QID PRN Rosezetta Schlatter, MD       tuberculin injection 5 Units  5 Units Intradermal Once Rosezetta Schlatter, MD   5 Units at 08/22/21 1206    Lab Results:    Blood Alcohol level:  Lab Results  Component Value Date   ETH <10 08/11/2021   ETH <10 22/09/5425    Metabolic Disorder Labs: Lab Results  Component Value Date   HGBA1C 5.8 (H) 08/12/2021   MPG 119.76 08/12/2021   MPG 125.5 04/27/2021   Lab Results  Component Value Date   PROLACTIN 74.6 (H) 09/28/2019   PROLACTIN 4.2 (L) 12/19/2018   Lab Results  Component Value Date   CHOL 125 08/16/2021   TRIG 54 08/16/2021   HDL 40 (L) 08/16/2021   CHOLHDL 3.1 08/16/2021   VLDL 11 08/16/2021   LDLCALC 74 08/16/2021   LDLCALC 95 08/12/2021     Physical Findings: AIMS: Facial and Oral Movements Muscles of Facial Expression: None, normal Lips and Perioral Area: None, normal Jaw: None, normal Tongue: None, normal,Extremity Movements Upper (arms, wrists, hands, fingers): None, normal Lower (legs, knees, ankles, toes): None, normal, Trunk Movements Neck, shoulders, hips: None, normal, Overall Severity Severity of abnormal movements (highest score from questions above): None, normal Incapacitation due to abnormal movements: None, normal [name] awareness of abnormal movements (rate only patient's report): No Awareness, Dental Status Current problems with teeth and/or dentures?: No Does patient usually wear dentures?: No    Musculoskeletal: Strength & Muscle Tone: within normal limits Gait & Station: normal Patient leans: N/A  Psychiatric Specialty Exam:  Presentation  General Appearance: Appropriate for Environment  Eye Contact:Fair  Speech:mumbling quality, normal rate and fluency  Speech Volume:Normal  Handedness:Right   Mood and Affect  Mood: Euthymic, more tired  Affect:constricted, but with more range   Thought Process  Thought Processes:Tangential  Descriptions of Associations: Unchanged in being tangential and disorganized on assessment, but remains less tangential overall  Orientation: Oriented to person, place, and time  Thought Content: Paranoid thought content but denies SI/HI/AVH, paranoia, and ideas of reference or first rank symptoms on the unit.  Does not voice delusions.   [name] of Schizophrenia/Schizoaffective disorder:Yes  Duration of Psychotic Symptoms:Greater than six months  Hallucinations: Denies  Ideas of Reference:None  Suicidal Thoughts:Denies  Homicidal Thoughts:Denies  Sensorium  Memory:Immediate Good; Recent Good  Judgment:Fair  Insight:Fair (Improving)   Executive Functions  Concentration: Fair, improving  Attention Span: Fair,  improving  Nora   Psychomotor Activity  Psychomotor Activity: less sedated appearing on morning assessment  Assets  Assets:Housing; Leisure Time   Sleep  Number of Hours of Sleep: 8.75   Physical Exam Vitals and nursing note reviewed.  Constitutional:      General: She is not in acute distress.    Appearance: Normal appearance. She is not toxic-appearing.  HENT:     Head: Normocephalic and atraumatic.  Pulmonary:     Effort: Pulmonary effort is normal.  Skin:    General: Skin is warm and dry.  Neurological:     General: No focal deficit present.     Mental Status: She is alert and oriented to person, place, and time.     Motor: No weakness.   Review of Systems  Constitutional:  Negative for chills, fever and malaise/fatigue.       Increased sedation  Respiratory:  Negative for shortness of breath.   Cardiovascular:  Negative for chest pain and palpitations.  Gastrointestinal:  Negative for diarrhea, heartburn, nausea and vomiting.  Genitourinary:  Negative for dysuria, flank pain, frequency and urgency.  Neurological:  Negative for dizziness, focal weakness and headaches.  Psychiatric/Behavioral:  Negative for depression, hallucinations and suicidal ideas. The patient is not nervous/anxious.   All other systems reviewed and are negative. Blood pressure 114/84, pulse 100, temperature 97.7 F (36.5 C), temperature source Oral, resp. rate 16, height _0  (1.727 m), weight 117.5 kg, SpO2 99 %. Body mass index is 39.38 kg/m.   ASSESSMENT: Principal Problem:   Schizoaffective disorder, bipolar type ([place]) Active Problems:   Prediabetes   Essential hypertension  Treatment Plan Summary: Daily contact with patient to assess and evaluate symptoms and progress in treatment and Medication management     Safety and Monitoring: voluntarily admission to inpatient psychiatric unit for safety, stabilization and  treatment Daily contact with patient to assess and evaluate symptoms and progress in treatment [name] case to be discussed in multi-disciplinary team meeting Observation Level : q15 minute checks Vital signs: q12 hours Precautions: suicide, elopement, and assault   2.   Psychiatric diagnosis #Schizoaffective disorder-bipolar type #PTSD - Continue Depakote ER 1000 mg nightly for mood stabilization (started 12/16; VPA level 77 on 08-16-2021,WBC 5.5, platelets 273, and H/H 11.3/35.2 (c/w baseline anemia) on 12/20; LFT WNL on 12/27 -Zyprexa discontinued, due to initiation of Clozaril, as patient was unbalanced with 10 mg Zyprexa.  ACT team confirmed that patient did receive Invega sustenna 283m LAI on 12/6,  patient has failed the Invega as monotherapy.  Labs ordered: echocardiogram obtained shows LVEF 60% with grade 1 diastolic dysfunction; CBC with differential- Hgb 11.3, fasting lipid panel- HDL 40, VPA level- 77, troponin- 2, CRP- 0.9, ESR- 42 , BNP- 5.1, A1c 5.8%, EKG unremarkable, and beta hCG negative.  Repeat labs (12/27-entered into clozapine REMS): ESR 30 (downtrending appropriately), troponin 3, CBC with differential-Hgb 11.9, and CRP 0.8. --Will increase Clozaril by 25 mg today, as patient is more paranoid than previous day and remains tangential, as well to prevent dose increase on day of discharge. Today's daily total of 87.5 mg.  Will increase by 12.5 mg tomorrow to 50 mg twice daily, and monitor for 24 hours on dose, with the patient to discharge on 12/29. -Continue Colace 100 mg for bowel regimen while on clozapine.   3. Medical Management: On admission Covid negative CMP: Unremarkable CBC: Hgb 10.9 EtOH: <10 UDS: Negative TSH: 1.276 A1C: 5.8% Lipids: Unremarkable   #Prediabetes #[name] of dyslipidemia - Continue metformin 500 mg once daily for prediabetes and prevention of antipsychotic induced metabolic syndrome - Continue home atorvastatin 10 mg daily  #Asthma -  Continue home Dulera 2 puffs twice daily  Discharge Planning:              -- Social work and case management to assist with discharge planning and identification of hospital follow-up needs prior to discharge.              -- Estimated LOS: 10-14 days; patient to discharge 12/29 to group home, pending TB and COVID  labs.             -- Discharge Concerns: Need to establish a safety plan; Medication compliance and effectiveness             -- Discharge Goals: Return home with outpatient referrals for mental health follow-up including medication management/psychotherapy   Rosezetta Schlatter, MD  PGY-1 08/23/2021, 7:30 AM  I personally spent 25 minutes on the unit in the direct patient care. The direct patient care time included face to face time with the patient, reviewing chart, communicating with other professionals and coordinating care. Greater then 50% of this time was spent in counseling and coordinating care with the patient regarding goals of hospitalization, psycho-education and discharge planning.   Patient was seen with house officer today and discussed, I agree with the assessment and plan There is improvement and she is less sleepy during the day, continues to have paranoia and gets tangent but overall improvement.  Considering to increase clozaril as above and if sleepy during the day to give more clozaril divided dose at night

## 2021-08-23 NOTE — Progress Notes (Addendum)
Clozapine  Patient REMS id [id number] Organizational RDA [id number]  Pt requires weekly anc   Einar Grad, Pharm D

## 2021-08-23 NOTE — [facility] Counselor (Signed)
CSW contacted [name] to notify them that pt will be ready for discharge on Thursday. CSW left a HIPAA compliant message for this pt's [name] team.   Toney Reil, [place]

## 2021-08-23 NOTE — Progress Notes (Signed)
Adult Psychoeducational Group Note  Date:  08/23/2021 Time:  9:26 PM  Group Topic/Focus:  Wrap-Up Group:   The focus of this group is to help patients review their daily goal of treatment and discuss progress on daily workbooks.  Participation Level:  Did Not Attend  Participation Quality:   Did Not Attend  Affect:   Did Not Attend  Cognitive:   Did Not Attend  Insight: None  Engagement in Group:   Did Not Attend  Modes of Intervention:   Did Not Attend  Additional Comments:  Pt did not attend evening wrap up tonight.  Candy Sledge 08/23/2021, 9:26 PM

## 2021-08-24 LAB — SARS CORONAVIRUS 2 (TAT 6-24 HRS): SARS Coronavirus 2: NEGATIVE

## 2021-08-24 NOTE — Progress Notes (Signed)
Pt been sleep much of the evening    08/24/21 2000  Psych Admission Type (Psych Patients Only)  Admission Status Voluntary  Psychosocial Assessment  Patient Complaints Worrying  Eye Contact Fair  Facial Expression Animated  Affect Appropriate to circumstance  Speech Logical/coherent  Interaction Assertive  Motor Activity Slow  Appearance/Hygiene In hospital gown  Behavior Characteristics Cooperative  Mood Pleasant  Aggressive Behavior  Effect No apparent injury  Thought Process  Coherency Circumstantial  Content WDL  Delusions None reported or observed  Perception WDL  Hallucination None reported or observed  Judgment WDL  Confusion None  Danger to Self  Current suicidal ideation? Denies  Danger to Others  Danger to Others None reported or observed

## 2021-08-24 NOTE — Progress Notes (Addendum)
[facility] MD Progress Note  08/24/2021 7:39 AM [name]  MRN:  [id number] Subjective:  [name] is a 50 year old female with a psychiatric history of schizoaffective disorder-bipolar type and a medical history of asthma and dyslipidemia who presented voluntarily and admitted for disorganized thoughts and paranoia.  Chart Review of Past 24 hrs: The patient's chart was reviewed and nursing notes were reviewed. The patient's case was discussed in multidisciplinary team meeting.  Per MAR: - Patient is compliant with scheduled meds. - PRNs: None Per RN notes, no documented behavioral issues and has been attending some groups. Patient sleep hours not documented  Patient had the following psychiatric recommendations yesterday:  - Continue Depakote ER 1000 mg nightly for mood stabilization (started 12/16; VPA level 77 on 08-16-2021,WBC 5.5, platelets 273, and H/H 11.3/35.2 (c/w baseline anemia) on 12/20; LFT WNL on 12/27 -Zyprexa discontinued, due to initiation of Clozaril, as patient was unbalanced with 10 mg Zyprexa.  ACT team confirmed that patient did receive Invega sustenna 280m LAI on 12/6,  patient has failed the Invega as monotherapy.  Labs ordered: echocardiogram obtained shows LVEF 60% with grade 1 diastolic dysfunction; CBC with differential- Hgb 11.3, fasting lipid panel- HDL 40, VPA level- 77, troponin- 2, CRP- 0.9, ESR- 42 , BNP- 5.1, A1c 5.8%, EKG unremarkable, and beta hCG negative.  Repeat labs (12/27-entered into clozapine REMS): ESR 30 (downtrending appropriately), troponin 3, CBC with differential-Hgb 11.9, and CRP 0.8. --Will increase Clozaril by 25 mg today, as patient is more paranoid than previous day and remains tangential, as well to prevent dose increase on day of discharge. Today's daily total of 87.5 mg.  Will increase by 12.5 mg tomorrow to 50 mg twice daily, and monitor for 24 hours on dose, with the patient to discharge on 12/29. -Continue Colace 100 mg for bowel regimen while  on clozapine.  On Today's Assessment (08/24/2021): Case was discussed in the multidisciplinary team. MAR was reviewed and patient was compliant with medications.   She reports she is feeling "alright" today, although she remains moderately sedated. She reports sleeping well last night and has a stable appetite.  She denies any depression or anxiety.  She is remains tangential today on assessment, but less, and she is able to stay on topic more appropriately. She is also less paranoid and disorganized in speech. She does continue to pause after answering questions.  She has been tolerating her medications well and denies any side effects today.  She denies all other somatic symptoms. She denies SI/HI/AVH,ideas of reference, and paranoia on the unit.  Patient is aware that she has group home placement, and she looks forward to going tomorrow.   Review of symptoms, specific for clozapine: Malaise:Denies Chest pain: Denies Shortness of breath: Denies Exertional capacity: Normal, "winded" with increased activity Tachycardia: Reports denies palpitations subjectively, pulse 99 this a.m. then 108 on recheck Cough: Denies Fever: Denies Sedation: Reports mild sedation today Orthostatic hypotension (dizziness with standing): Denies today Hypersalivation: some baseline drooling, denies today Constipation: Denies Symptoms of GERD: Denies Nausea: Denies Nocturnal enuresis: Denies   Principal Problem: Schizoaffective disorder, bipolar type ([place] Diagnosis: Principal Problem:   Schiz[place]ffective disorder, bipolar type ([place] Active Problems:   Prediabetes   Essential hypertension   Past Psychiatric History: See H&P  Past Medical History:  Past Medical History:  Diagnosis Date   Anxiety    Asthma    Bipolar 1 disorder ([place]    Depression    Gallstones 08/2020   Hypertension  Insomnia, persistent    Prediabetes    Schizophrenic disorder (HCC)    Seizures ([place])     Past Surgical History:   Procedure Laterality Date   BREAST LUMPECTOMY Left 07/2013   TONSILLECTOMY     TUBAL LIGATION  2002   Family History:  Family History  Problem Relation Age of Onset   Depression Mother    Gout Mother    Cancer Father        prostate   Other Father        lung issue   Alcoholism Other    Heart attack Paternal Grandfather    Heart attack Paternal Grandmother    Heart attack Maternal Grandmother    Heart attack Maternal Grandfather    Depression Son    Anxiety disorder Son    Family Psychiatric  History: See H&P Social History:  Social History   Substance and Sexual Activity  Alcohol Use Not Currently     Social History   Substance and Sexual Activity  Drug Use No    Social History   Socioeconomic History   Marital status: Single    Spouse name: Not on file   Number of children: Not on file   Years of education: Not on file   Highest education level: Not on file  Occupational History   Not on file  Tobacco Use   Smoking status: Former    Types: Cigarettes   Smokeless tobacco: Never  Vaping Use   Vaping Use: Never used  Substance and Sexual Activity   Alcohol use: Not Currently   Drug use: No   Sexual activity: Not Currently  Other Topics Concern   Not on file  Social History Narrative   Not on file   Social Determinants of Health   Financial Resource Strain: Not on file  Food Insecurity: Food Insecurity Present   Worried About [place] in the Last Year: Sometimes true   Ran Out of Food in the Last Year: Sometimes true  Transportation Needs: No Transportation Needs   Lack of Transportation (Medical): No   Lack of Transportation (Non-Medical): No  Physical Activity: Not on file  Stress: Not on file  Social Connections: Not on file   Additional Social History:         Sleep: Good  Appetite:  Good  Current Medications: Current Facility-Administered Medications  Medication Dose Route Frequency Provider Last Rate Last Admin    albuterol (VENTOLIN HFA) 108 (90 Base) MCG/ACT inhaler 1-2 puff  1-2 puff Inhalation Q4H PRN Rosezetta Schlatter, MD       ascorbic acid (VITAMIN C) tablet 1,000 mg  1,000 mg Oral Daily Suella Broad, FNP   1,000 mg at 08/23/21 0900   atorvastatin (LIPITOR) tablet 10 mg  10 mg Oral Daily Suella Broad, FNP   10 mg at 08/23/21 0900   cloZAPine (CLOZARIL) tablet 25 mg  25 mg Oral Daily Rosezetta Schlatter, MD       And   cloZAPine (CLOZARIL) tablet 75 mg  75 mg Oral QHS Rosezetta Schlatter, MD       divalproex (DEPAKOTE ER) 24 hr tablet 1,000 mg  1,000 mg Oral QHS Rosezetta Schlatter, MD   1,000 mg at 08/23/21 2041   docusate sodium (COLACE) capsule 100 mg  100 mg Oral Daily Rosezetta Schlatter, MD   100 mg at 08/23/21 0900   hydrOXYzine (ATARAX) tablet 25 mg  25 mg Oral TID PRN Rosezetta Schlatter, MD   25 mg  at 08/23/21 1723   hydrOXYzine (ATARAX) tablet 50 mg  50 mg Oral QHS PRN Rosezetta Schlatter, MD   50 mg at 08/22/21 2040   ibuprofen (ADVIL) tablet 400 mg  400 mg Oral Q6H PRN Suella Broad, FNP   400 mg at 08/16/21 1139   magnesium hydroxide (MILK OF MAGNESIA) suspension 30 mL  30 mL Oral Daily PRN Suella Broad, FNP       melatonin tablet 3 mg  3 mg Oral QHS Rosezetta Schlatter, MD   3 mg at 08/23/21 2042   metFORMIN (GLUCOPHAGE) tablet 500 mg  500 mg Oral Q breakfast Rosezetta Schlatter, MD   500 mg at 08/23/21 0900   mometasone-formoterol (DULERA) 200-5 MCG/ACT inhaler 2 puff  2 puff Inhalation BID Suella Broad, FNP   2 puff at 08/23/21 0900   simethicone (MYLICON) chewable tablet 80 mg  80 mg Oral QID PRN Rosezetta Schlatter, MD       tuberculin injection 5 Units  5 Units Intradermal Once Rosezetta Schlatter, MD   5 Units at 08/22/21 1206    Lab Results:    Blood Alcohol level:  Lab Results  Component Value Date   ETH <10 08/11/2021   ETH <10 83/66/2947    Metabolic Disorder Labs: Lab Results  Component Value Date   HGBA1C 5.8 (H) 08/12/2021   MPG 119.76 08/12/2021   MPG  125.5 04/27/2021   Lab Results  Component Value Date   PROLACTIN 74.6 (H) 09/28/2019   PROLACTIN 4.2 (L) 12/19/2018   Lab Results  Component Value Date   CHOL 125 08/16/2021   TRIG 54 08/16/2021   HDL 40 (L) 08/16/2021   CHOLHDL 3.1 08/16/2021   VLDL 11 08/16/2021   LDLCALC 74 08/16/2021   LDLCALC 95 08/12/2021    Physical Findings: AIMS: Facial and Oral Movements Muscles of Facial Expression: None, normal Lips and Perioral Area: None, normal Jaw: None, normal Tongue: None, normal,Extremity Movements Upper (arms, wrists, hands, fingers): None, normal Lower (legs, knees, ankles, toes): None, normal, Trunk Movements Neck, shoulders, hips: None, normal, Overall Severity Severity of abnormal movements (highest score from questions above): None, normal Incapacitation due to abnormal movements: None, normal [name] awareness of abnormal movements (rate only patient's report): No Awareness, Dental Status Current problems with teeth and/or dentures?: No Does patient usually wear dentures?: No    Musculoskeletal: Strength & Muscle Tone: within normal limits Gait & Station: normal Patient leans: N/A  Psychiatric Specialty Exam:  Presentation  General Appearance: Appropriate for Environment  Eye Contact:Fair  Speech:mumbling quality, normal rate and fluency  Speech Volume:Normal  Handedness:Right   Mood and Affect  Mood: Euthymic, remains more tired  Affect: Congruent, with more range   Thought Process  Thought Processes:Tangential, but less  Descriptions of Associations: Less tangential and disorganized on assessment today, and remains less tangential overall  Orientation: Oriented to person, place, and time  Thought Content: Denies SI/HI/AVH, paranoia, and ideas of reference or first rank symptoms on the unit.  Does not voice delusions.   History of Schizophrenia/Schizoaffective disorder:Yes  Duration of Psychotic Symptoms:Greater than six  months  Hallucinations: Denies  Ideas of Reference:None  Suicidal Thoughts:Denies  Homicidal Thoughts:Denies  Sensorium  Memory:Immediate Good; Recent Good  Judgment:Fair  Insight:Fair (Improving)   Executive Functions  Concentration: Fair, improving  Attention Span: Fair, improving  [place]   Psychomotor Activity  Psychomotor Activity: Normal but more sedated appearing on morning assessment  Assets  Assets:Housing; Leisure  Time   Sleep  Number of Hours of Sleep: 8.25   Physical Exam Vitals and nursing note reviewed.  Constitutional:      General: She is not in acute distress.    Appearance: Normal appearance. She is not toxic-appearing.  HENT:     Head: Normocephalic and atraumatic.  Pulmonary:     Effort: Pulmonary effort is normal.  Skin:    General: Skin is warm and dry.  Neurological:     General: No focal deficit present.     Mental Status: She is alert and oriented to person, place, and time.     Motor: No weakness.   Review of Systems  Constitutional:  Negative for chills, fever and malaise/fatigue.       Increased sedation  Respiratory:  Negative for shortness of breath.   Cardiovascular:  Negative for chest pain and palpitations.  Gastrointestinal:  Negative for diarrhea, heartburn, nausea and vomiting.  Genitourinary:  Negative for dysuria, flank pain, frequency and urgency.  Neurological:  Negative for dizziness, focal weakness and headaches.  Psychiatric/Behavioral:  Negative for depression, hallucinations and suicidal ideas. The patient is not nervous/anxious.   All other systems reviewed and are negative. Blood pressure 115/84, pulse (!) 108, temperature (!) 97.2 F (36.2 C), temperature source Oral, resp. rate 16, height 5' 8" (1.727 m), weight 117.5 kg, SpO2 100 %. Body mass index is 39.38 kg/m.   ASSESSMENT: Principal Problem:   Schizoaffective disorder, bipolar type ([place]) Active  Problems:   Prediabetes   Essential hypertension  Treatment Plan Summary: Daily contact with patient to assess and evaluate symptoms and progress in treatment and Medication management     Safety and Monitoring: voluntarily admission to inpatient psychiatric unit for safety, stabilization and treatment Daily contact with patient to assess and evaluate symptoms and progress in treatment [name] case to be discussed in multi-disciplinary team meeting Observation Level : q15 minute checks Vital signs: q12 hours Precautions: suicide, elopement, and assault   2.   Psychiatric diagnosis #Schizoaffective disorder-bipolar type #PTSD - Continue Depakote ER 1000 mg nightly for mood stabilization (started 12/16; VPA level 77 on 08-16-2021,WBC 5.5, platelets 273, and H/H 11.3/35.2 (c/w baseline anemia) on 12/20; LFT WNL on 12/27 -Zyprexa discontinued, due to initiation of Clozaril, as patient was unbalanced with 10 mg Zyprexa.  ACT team confirmed that patient did receive Invega sustenna 274m LAI on 12/6,  patient has failed the Invega as monotherapy.  Labs ordered: echocardiogram obtained shows LVEF 60% with grade 1 diastolic dysfunction; CBC with differential- Hgb 11.3, fasting lipid panel- HDL 40, VPA level- 77, troponin- 2, CRP- 0.9, ESR- 42 , BNP- 5.1, A1c 5.8%, EKG unremarkable, and beta hCG negative.  Repeat labs (12/27-entered into clozapine REMS): ESR 30 (downtrending appropriately), troponin 3, CBC with differential-Hgb 11.9, and CRP 0.8. --Will increase Clozaril by 12.5 mg today, for a daily total of 100 mg (dose divided 25 mg every morning and 75 mg nightly).  Will hold at this dose to prepare for discharge tomorrow -Continue Colace 100 mg for bowel regimen while on clozapine.   3. Medical Management: On admission Covid negative CMP: Unremarkable CBC: Hgb 10.9 EtOH: <10 UDS: Negative TSH: 1.276 A1C: 5.8% Lipids: Unremarkable   #Prediabetes #History of dyslipidemia - Continue  metformin 500 mg once daily for prediabetes and prevention of antipsychotic induced metabolic syndrome - Continue home atorvastatin 10 mg daily  #Asthma - Continue home Dulera 2 puffs twice daily  Discharge Planning:              --  Social work and case management to assist with discharge planning and identification of hospital follow-up needs prior to discharge.              -- Estimated LOS: 10-14 days; patient to discharge 12/29 to group home; COVID-negative, TB negative             -- Discharge Concerns: Need to establish a safety plan; Medication compliance and effectiveness             -- Discharge Goals: Return home with outpatient referrals for mental health follow-up including medication management/psychotherapy   Rosezetta Schlatter, MD  PGY-1 08/24/2021, 7:39 AM

## 2021-08-24 NOTE — Progress Notes (Signed)
Pt remains medications compliant with adverse drug reactions. Denies SI, HI, AVH and pain when assessed. Attended some groups in milieu. Tolerates all meals and fluids well. PPD read this shift and is negative with 0 mm induration to left forearm. Emotional support and encouragement offered to pt. Safety checks maintained at Q 15 minutes intervals without self harm gestures. All medications administered with verbal education and effects monitored. Pt remains safe on and off unit.

## 2021-08-24 NOTE — Discharge Instructions (Addendum)
Dear [name], I am so glad you are feeling better and can be discharged today (08/24/2021)! You were admitted for management of schizoaffective disorder-bipolar type.   Please see the following instructions: Please attend the scheduled appointment or make an appointment to follow-up with your psychiatrist, therapist, and the other specialists seen below. Weekly lab follow-up for management while taking Clozapine: CBC, Troponin, ESR, CRP, EKG. Last obtained 12/27.  Monitor Depakote level and Hepatic function. Last check of Valproic acid level 12/20, and Hepatic function 12/27. Both were within normal limits.  Please see your primary care / family doctor for your other medical issues, concerns and or health care needs. Follow-up for management of pre-diabetes and elevated cholesterol. NEW meds:  Clozapine 25 mg by mouth every morning and 75 mg at bedtime, Metformin 500 mg every morning with breakfast CONTINUE home meds, some with changes: Depakote ER 1000 mg by mouth at bedtime, Vitamin C 1000 mg daily, Lipitor 10 mg daily, Dulera inhaler 2 puffs two times daily, and Colace 100 mg daily. STOP meds:  Trazodone 100 mg  Report any adverse effects and or reactions from the medicines to your outpatient provider promptly. Do not engage in alcohol and or illegal drug use while on prescription medicines. In the event of worsening symptoms, text or call the [place] at 29, call 911, visit the [facility] [place] located at [address], and/or go to the nearest ED for appropriate evaluation and treatment of symptoms.  It was a pleasure meeting you, [name].  I wish you and your family the best, and hope you stay happy and healthy!  Rosezetta Schlatter, MD 08/24/2021

## 2021-08-24 NOTE — Progress Notes (Signed)
°   08/24/21 0500  Sleep  Number of Hours 8.25

## 2021-08-24 NOTE — Group Note (Signed)
Recreation Therapy Group Note   Group Topic:Health and Wellness  Group Date: 08/24/2021 Start Time: 1000 End Time: 1035 Facilitators: Victorino Sparrow, LRT,CTRS Location: [address] Dayroom   Goal Area(s) Addresses:  Patient will define components of whole wellness. Patient will verbalize benefit of whole wellness.   Group Description: Exercise.  LRT led group in a series of stretches to loosen them up.  Patients then led the group one by one in an exercise of their choosing.  The group was going for at least 30 minutes of solid exercise.  Patients were encouraged to get water and take breaks as needed.   Affect/Mood: N/A   Participation Level: Did not attend    Clinical Observations/Individualized Feedback:     Plan: Continue to engage patient in RT group sessions 2-3x/week.   Victorino Sparrow, Glennis Brink 08/24/2021 12:24 PM

## 2021-08-24 NOTE — Group Note (Signed)
Date:  08/24/2021 Time:  9:55 AM  Group Topic/Focus:  Orientation:   The focus of this group is to educate the patient on the purpose and policies of crisis stabilization and provide a format to answer questions about their admission.  The group details unit policies and expectations of patients while admitted.    Participation Level:  Did Not Attend  Participation Quality:    Affect:    Cognitive:    Insight:   Engagement in Group:   Modes of Intervention:    Additional Comments:    [name] 08/24/2021, 9:55 AM

## 2021-08-24 NOTE — [facility] Counselor (Signed)
CSW contacted [name] to notify them that pt will be ready for discharge on Thursday. CSW left a HIPAA compliant message for this pt's [name] team.    [name], [place]

## 2021-08-25 MED ORDER — HYDROXYZINE PAMOATE 25 MG PO CAPS: 25.0000 mg | ORAL_CAPSULE | Freq: Three times a day (TID) | ORAL | 0 refills | Status: DC | PRN

## 2021-08-25 MED ORDER — MOMETASONE FURO-FORMOTEROL FUM 200-5 MCG/ACT IN AERO: 2.0000 | INHALATION_SPRAY | Freq: Two times a day (BID) | RESPIRATORY_TRACT | 0 refills | Status: DC

## 2021-08-25 MED ORDER — CLOZAPINE 25 MG PO TABS: 75.0000 mg | ORAL_TABLET | Freq: Every day | ORAL | 0 refills | Status: DC

## 2021-08-25 MED ORDER — DOCUSATE SODIUM 100 MG PO CAPS
100.0000 mg | ORAL_CAPSULE | Freq: Every day | ORAL | 0 refills | Status: DC
Start: 2021-08-25 — End: 2021-08-25

## 2021-08-25 MED ORDER — DIVALPROEX SODIUM ER 500 MG PO TB24: 1000.0000 mg | ORAL_TABLET | Freq: Every day | ORAL | 0 refills | Status: DC

## 2021-08-25 MED ORDER — DOCUSATE SODIUM 100 MG PO CAPS: 100.0000 mg | ORAL_CAPSULE | Freq: Every day | ORAL | 0 refills | Status: DC

## 2021-08-25 MED ORDER — METFORMIN HCL 500 MG PO TABS: 500.0000 mg | ORAL_TABLET | Freq: Every day | ORAL | 0 refills | Status: DC

## 2021-08-25 MED ORDER — CLOZAPINE 25 MG PO TABS: 25.0000 mg | ORAL_TABLET | Freq: Every day | ORAL | 0 refills | Status: DC

## 2021-08-25 MED ORDER — ATORVASTATIN CALCIUM 10 MG PO TABS: 10.0000 mg | ORAL_TABLET | Freq: Every day | ORAL | 0 refills | Status: DC

## 2021-08-25 MED ORDER — MELATONIN 3 MG PO TABS: 3.0000 mg | ORAL_TABLET | Freq: Every day | ORAL | 0 refills | Status: DC

## 2021-08-25 MED ORDER — MELATONIN 3 MG PO TABS
3.0000 mg | ORAL_TABLET | Freq: Every day | ORAL | 0 refills | Status: DC
Start: 2021-08-25 — End: 2022-04-26

## 2021-08-25 NOTE — B[facility] Suicide Risk Assessment (Addendum)
[facility] Discharge Suicide Risk Assessment   Principal Problem: Schizoaffective disorder, bipolar type [facility]) Discharge Diagnoses: Principal Problem:   Schizoaffective disorder, bipolar type ([place]) Active Problems:   Prediabetes   Essential hypertension   Total Time spent with patient: 7 minutes  [name] is a 50 year old female with a psychiatric history of schizoaffective disorder-bipolar type and a medical history of asthma and dyslipidemia who presented voluntarily and admitted for disorganized thoughts and paranoia.  During the patient's hospitalization, patient had extensive initial psychiatric evaluation, and follow-up psychiatric evaluations every day.  Psychiatric diagnoses provided upon initial assessment:  #Schizoaffective disorder-bipolar type #PTSD  [name] psychiatric medications were adjusted on admission:  - Initiate Depakote ER 1000 mg nightly for mood stabilization; patient reports discontinuing Depakote 2/2 weight gain, but may have decompensated without it. - Initiate Zyprexa 5 mg nightly for psychotic symptoms.  If patient missed [place] injection, will initiate Invega 6 mg daily and administer LAI.  If patient has received last LAI, we will begin work-up for Clozaril, as patient will have failed the Invega. -(It was confirmed on Monday after admission, that the pt did receive invega sustenna injection on 08-02-21)  During the hospitalization, other adjustments were made to the patient's psychiatric medication regimen:  -Zyprexa was increased, without effect for treating psychotic symptoms, and was therefore tapered, stopped, and clozapine was started -Clozapine was started and increased to current dose, which is 25 mg in the morning and 75 mg at bedtime.  Most recent [place] was 08-23-2021: 3,500 -colace 100 mg bid was started for constipation and was effective  -metformin 500 mg once daily was started to mitigate antipsychotic induced weight gain and metabolic side  effects   Gradually, patient started adjusting to milieu.   [name] care was discussed during the interdisciplinary team meeting every day during the hospitalization.  The patient reports some dizziness that is mild and responds to behavioral intervention, and also reports some sedation which pt reports is tolerable. She otherwise denies having side effects to prescribed psychiatric medications.  Review of symptoms, specific for clozapine: Malaise:Denies Chest pain: Denies Shortness of breath: Denies Exertional capacity: Normal, "winded" with increased activity Tachycardia: Reports denies palpitations subjectively. HR <100 on day of discharge  Cough: Denies Fever: Denies Sedation: Reports mild sedation today Orthostatic hypotension (dizziness with standing): Denies today Hypersalivation: some baseline drooling, denies today Constipation: Denies Symptoms of GERD: Denies Nausea: Denies Nocturnal enuresis: Denies  The patient reports their target psychiatric symptoms of paranoia, disorganized thinking, suicidal thoughts, and mood lability, all responded well to the psychiatric medications, and the patient reports overall benefit other psychiatric hospitalization. Supportive psychotherapy was provided to the patient. The patient also participated in regular group therapy while admitted.   Labs were reviewed with the patient, and abnormal results were discussed with the patient.  The patient denied having suicidal thoughts more than 48 hours prior to discharge.  Patient denies having homicidal thoughts.  Patient denies having auditory hallucinations.  Patient denies any visual hallucinations.  Patient denies having paranoid thoughts.  The patient is able to verbalize their individual safety plan to this provider.  It is recommended to the patient to continue psychiatric medications as prescribed, after discharge from the hospital.    It is recommended to the patient to follow up with  your outpatient psychiatric provider and PCP.  Discussed with the patient, the impact of alcohol, drugs, tobacco have been there overall psychiatric and medical wellbeing, and total abstinence from substance use was recommended the patient.  Musculoskeletal: Strength & Muscle Tone: within normal limits Gait & Station: normal Patient leans: N/A  Psychiatric Specialty Exam  Presentation  General Appearance: Appropriate for Environment; Casual; Fairly Groomed  Eye Contact:Good  Speech:Clear and Coherent; Normal Rate  Speech Volume:Normal  Handedness:Right   Mood and Affect  Mood:Euthymic  Duration of Depression Symptoms: Greater than two weeks  Affect:Appropriate; Congruent; Full Range   Thought Process  Thought Processes:Linear; Coherent  Descriptions of Associations:Intact  Orientation:Full (Time, Place and Person)  Thought Content:Logical  History of Schizophrenia/Schizoaffective disorder:Yes  Duration of Psychotic Symptoms:Greater than six months  Hallucinations:Hallucinations: None  Ideas of Reference:None  Suicidal Thoughts:Suicidal Thoughts: No  Homicidal Thoughts:Homicidal Thoughts: No   Sensorium  Memory:Immediate Good; Recent Good; Remote Good  Judgment:Good  Insight:Good   Executive Functions  Concentration:Good  Attention Span:Good  [place] of Knowledge:Good  Language:Good   Psychomotor Activity  Psychomotor Activity:Psychomotor Activity: Normal   Assets  Assets:Housing; Leisure Time   Sleep  Sleep:Sleep: Good   Physical Exam: Physical Exam Vitals reviewed.  Constitutional:      General: She is not in acute distress.    Appearance: She is not ill-appearing or toxic-appearing.  Pulmonary:     Effort: Pulmonary effort is normal.  Neurological:     Mental Status: She is alert.     Motor: No weakness.  Psychiatric:        Mood and Affect: Mood normal.        Behavior: Behavior normal.         Thought Content: Thought content normal.        Judgment: Judgment normal.   Review of Systems  Constitutional:  Negative for chills and fever.  Cardiovascular:  Negative for chest pain and palpitations.  Neurological:  Negative for tingling, tremors and headaches.  Psychiatric/Behavioral:  Negative for depression, hallucinations, memory loss, substance abuse and suicidal ideas. The patient is not nervous/anxious and does not have insomnia.   All other systems reviewed and are negative.  Blood pressure (!) 126/91, pulse 94, temperature 97.7 F (36.5 C), temperature source Oral, resp. rate 16, height $RemoveBe'5\' 8"'dEoJDzNHL$  (1.727 m), weight 117.5 kg, SpO2 98 %. Body mass index is 39.38 kg/m.  Mental Status Per Nursing Assessment::   On Admission:  NA  Demographic factors:  Low socioeconomic status, Living alone Loss Factors:  Financial problems / change in socioeconomic status Historical Factors:  History of suicide attempt in 90s.  Risk Reduction Factors:  Positive social support; positive therapeutic relationship  Continued Clinical Symptoms:  Schizoaffective disorder, bipolar type-paranoia and disorganized thinking has resolved.  Mood lability has resolved.  Mood is stable.  Denies feeling depressed.  Sleep is stable.  Appetite is stable.  Denies having suicidal thoughts.  Cognitive Features That Contribute To Risk:  None    Suicide Risk:  Minimal: No identifiable suicidal ideation.  Patients presenting with no risk factors but with morbid ruminations; may be classified as minimal risk based on the severity of the depressive symptoms   Follow-up Information     [name] Follow up.   Why: Please continue to follow up with services provided by your [name] team post-discharge. Contact information: [phone]                Plan Of Care/Follow-up recommendations:   Activity: as tolerated  Diet: heart healthy  Other: -Follow-up with your outpatient psychiatric provider  -instructions on appointment date, time, and address (location) are provided to you in discharge paperwork. Your act team psychiatrist  will manage your prescribing and monitoring of clozapine upon discharge from this psychiatric hospital. I recommend continuing Invega LAI,as the clinical response to clozapine, at current dose, accounts for current partial response to [facility]. The outpatient psychiatrist can decide if they would like to consolidate to clozapine monotherapy, but sedation, orthostasis, risk of falls, and other side effects of clozapine, could be too bothersome if clozapine dose was increased to a level that would replace Invega.   -Take your psychiatric medications as prescribed at discharge - instructions are provided to you in the discharge paperwork  -Follow-up with outpatient primary care doctor and other specialists -for management of chronic medical disease, including: management of blood pressure, lipids, asthma, preventive medicine, and management of clozapine.   -Testing: Follow-up with outpatient provider for lab results:  08-23-2021: ANC: 3,500 Troponin: 3 Hepatic panel: all wnl CRP: 0.8 ESR: 30 (down trending from 42 on 08-16-2021)  08-16-2021: Depakote level: 77 Lipid panel wnl BNP: 5.1  08-12-2021:  TSH 1.276 HbA1c: 5.8   -Recommend abstinence from alcohol, tobacco, and other illicit drug use at discharge.   -If your psychiatric symptoms recur, worsening, or if you have side effects to your psychiatric medications, call your outpatient psychiatric provider, 911, [phone] or go to the nearest emergency department.  -If suicidal thoughts recur, call your outpatient psychiatric provider, 911, [phone] or go to the nearest emergency department.   Christoper Allegra, MD 08/25/2021, 9:32 AM

## 2021-08-25 NOTE — Progress Notes (Signed)
°  [facility] Adult Case Management Discharge Plan :  Will you be returning to the same living situation after discharge:  No. Will be discharging to group home At discharge, do you have transportation home?: Yes,  group home to pick this pt up Do you have the ability to pay for your medications: Yes,  has insurance  Release of information consent forms completed and in the chart;  [name] signature needed at discharge.  Patient to Follow up at:  Follow-up Information     [name] Follow up.   Why: Please continue to follow up with services provided by your [name] team post-discharge. Contact information: [phone]                Next level of care provider has access to [place] and Suicide Prevention discussed: Yes,  with daughter and [name]     Has patient been referred to the Quitline?: N/A patient is not a smoker  Patient has been referred for addiction treatment: N/A  Vassie Moselle, LCSW 08/25/2021, 9:25 AM

## 2021-08-25 NOTE — [facility] Group Notes (Signed)
[place] Group Notes:  (Nursing/MHT/Case Management/Adjunct)  Date:  08/25/2021  Time:  11:15 AM  Type of Therapy:   Orientation/Goals group  Participation Level:  Active  Participation Quality:  Appropriate  Affect:  Appropriate  Cognitive:  Appropriate  Insight:  Good  Engagement in Group:  Engaged  Modes of Intervention:  Discussion, Education, and Orientation  Summary of Progress/Problems: Pt goal for today is to go home.   Tamasha Laplante J Edgard Debord 08/25/2021, 11:15 AM

## 2021-08-25 NOTE — BHH Counselor (Signed)
CSW spoke to [employer] (group home owner) [phone] who states they will pick pt up at [place], [place] [employer]

## 2021-08-25 NOTE — Progress Notes (Signed)
Adult Psychoeducational Group Note  Date:  08/25/2021 Time:  12:07 AM  Group Topic/Focus:  Wrap-Up Group:   The focus of this group is to help patients review their daily goal of treatment and discuss progress on daily workbooks.  Participation Level:  Did Not Attend  Participation Quality:   Did Not Attend  Affect:   Did Not Attend  Cognitive:   Did Not Attend  Insight: None  Engagement in Group:   Did Not Attend  Modes of Intervention:   Did Not Attend   Additional Comments:  Pt did not attend evening wrap up group tonight.  Candy Sledge 08/25/2021, 12:07 AM

## 2021-08-25 NOTE — Group Note (Signed)
Recreation Therapy Group Note   Group Topic:Leisure Education  Group Date: 08/25/2021 Start Time: 5409 End Time: 8119 Facilitators: Victorino Sparrow, LRT,CTRS Location: [address] Dayroom   Goal Area(s) Addresses:  Patient will successfully identify benefits of leisure participation. Patient will successfully identify ways to access leisure activities. Patient will identify how leisure can help you cope.   Group Description:  LRT and patients went over what a public service announcement and leisure was.  Patients were then instructed to create a PSA that would convince someone to give leisure a try.  Patients were to define leisure, where it can be done, who can do leisure, how it can be used as a Technical sales engineer and the ultimate benefits of leisure.  Patients would then share their PSA with the rest of the group.   Affect/Mood: N/A   Participation Level: Did not attend    Clinical Observations/Individualized Feedback:     Plan: Continue to engage patient in RT group sessions 2-3x/week.   Victorino Sparrow, LRT,CTRS 08/25/2021 11:48 AM

## 2021-08-25 NOTE — Progress Notes (Signed)
°   08/25/21 0600  Sleep  Number of Hours 9.25
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
## 2021-10-13 ENCOUNTER — Ambulatory Visit: Payer: 59 | Admitting: Emergency Medicine

## 2021-12-25 ENCOUNTER — Other Ambulatory Visit: Payer: Self-pay

## 2021-12-25 ENCOUNTER — Emergency Department (HOSPITAL_COMMUNITY)
Admission: EM | Admit: 2021-12-25 | Discharge: 2021-12-26 | Discharge status: Against Medical Advice | Payer: Medicare HMO | Attending: Emergency Medicine | Admitting: Emergency Medicine

## 2021-12-25 ENCOUNTER — Encounter (HOSPITAL_COMMUNITY): Payer: Self-pay | Admitting: Emergency Medicine

## 2021-12-25 DIAGNOSIS — H02843 Edema of right eye, unspecified eyelid: Secondary | ICD-10-CM | POA: Diagnosis present

## 2021-12-25 DIAGNOSIS — J45909 Unspecified asthma, uncomplicated: Secondary | ICD-10-CM | POA: Insufficient documentation

## 2021-12-25 DIAGNOSIS — Z5321 Procedure and treatment not carried out due to patient leaving prior to being seen by health care provider: Secondary | ICD-10-CM | POA: Diagnosis not present

## 2021-12-25 NOTE — ED Triage Notes (Signed)
Pt states she thinks she thinks she got bitten by something on her right eyelid. Pt reports that she has swelling on her eyelid. Pt reports she has a hx of asthma.  ?

## 2022-01-05 ENCOUNTER — Other Ambulatory Visit: Payer: Self-pay

## 2022-01-05 ENCOUNTER — Encounter (HOSPITAL_COMMUNITY): Payer: Self-pay | Admitting: *Deleted

## 2022-01-05 ENCOUNTER — Emergency Department (HOSPITAL_COMMUNITY)
Admission: EM | Admit: 2022-01-05 | Discharge: 2022-01-05 | Disposition: A | Discharge status: Home / Self Care | Payer: Medicare HMO | Attending: Emergency Medicine | Admitting: Emergency Medicine

## 2022-01-05 DIAGNOSIS — Z20822 Contact with and (suspected) exposure to covid-19: Secondary | ICD-10-CM | POA: Insufficient documentation

## 2022-01-05 DIAGNOSIS — R410 Disorientation, unspecified: Secondary | ICD-10-CM | POA: Insufficient documentation

## 2022-01-05 DIAGNOSIS — M7989 Other specified soft tissue disorders: Secondary | ICD-10-CM | POA: Insufficient documentation

## 2022-01-05 DIAGNOSIS — Z008 Encounter for other general examination: Secondary | ICD-10-CM | POA: Diagnosis not present

## 2022-01-05 DIAGNOSIS — Z7951 Long term (current) use of inhaled steroids: Secondary | ICD-10-CM | POA: Diagnosis not present

## 2022-01-05 DIAGNOSIS — J45909 Unspecified asthma, uncomplicated: Secondary | ICD-10-CM | POA: Insufficient documentation

## 2022-01-05 DIAGNOSIS — F22 Delusional disorders: Secondary | ICD-10-CM | POA: Insufficient documentation

## 2022-01-05 LAB — CBC WITH DIFFERENTIAL/PLATELET
Abs Immature Granulocytes: 0.03 10*3/uL (ref 0.00–0.07)
Basophils Absolute: 0 10*3/uL (ref 0.0–0.1)
Basophils Relative: 0 %
Eosinophils Absolute: 0.3 10*3/uL (ref 0.0–0.5)
Eosinophils Relative: 5 %
HCT: 36.2 % (ref 36.0–46.0)
Hemoglobin: 11.6 g/dL — ABNORMAL LOW (ref 12.0–15.0)
Immature Granulocytes: 0 %
Lymphocytes Relative: 35 %
Lymphs Abs: 2.5 10*3/uL (ref 0.7–4.0)
MCH: 28.4 pg (ref 26.0–34.0)
MCHC: 32 g/dL (ref 30.0–36.0)
MCV: 88.7 fL (ref 80.0–100.0)
Monocytes Absolute: 0.4 10*3/uL (ref 0.1–1.0)
Monocytes Relative: 6 %
Neutro Abs: 3.8 10*3/uL (ref 1.7–7.7)
Neutrophils Relative %: 54 %
Platelets: 269 10*3/uL (ref 150–400)
RBC: 4.08 MIL/uL (ref 3.87–5.11)
RDW: 14.2 % (ref 11.5–15.5)
WBC: 7.1 10*3/uL (ref 4.0–10.5)
nRBC: 0 % (ref 0.0–0.2)

## 2022-01-05 LAB — SALICYLATE LEVEL: Salicylate Lvl: <7 mg/dL — ABNORMAL LOW (ref 7.0–30.0)

## 2022-01-05 LAB — COMPREHENSIVE METABOLIC PANEL
ALT: 14 U/L (ref 0–44)
AST: 20 U/L (ref 15–41)
Albumin: 4 g/dL (ref 3.5–5.0)
Alkaline Phosphatase: 66 U/L (ref 38–126)
Anion gap: 7 (ref 5–15)
BUN: 10 mg/dL (ref 6–20)
CO2: 26 mmol/L (ref 22–32)
Calcium: 9.2 mg/dL (ref 8.9–10.3)
Chloride: 106 mmol/L (ref 98–111)
Creatinine, Ser: 0.89 mg/dL (ref 0.44–1.00)
GFR, Estimated: >60 mL/min (ref >60)
Glucose, Bld: 107 mg/dL — ABNORMAL HIGH (ref 70–99)
Potassium: 3.6 mmol/L (ref 3.5–5.1)
Sodium: 139 mmol/L (ref 135–145)
Total Bilirubin: 0.7 mg/dL (ref 0.3–1.2)
Total Protein: 7.4 g/dL (ref 6.5–8.1)

## 2022-01-05 LAB — RESP PANEL BY RT-PCR (FLU A&B, COVID) ARPGX2
Influenza A by PCR: NEGATIVE
Influenza B by PCR: NEGATIVE
SARS Coronavirus 2 by RT PCR: NEGATIVE

## 2022-01-05 LAB — I-STAT BETA HCG BLOOD, ED (MC, WL, AP ONLY): I-stat hCG, quantitative: <5 m[IU]/mL (ref <5)

## 2022-01-05 LAB — RAPID URINE DRUG SCREEN, HOSP PERFORMED
Amphetamines: NOT DETECTED
Barbiturates: NOT DETECTED
Benzodiazepines: NOT DETECTED
Cocaine: NOT DETECTED
Opiates: NOT DETECTED
Tetrahydrocannabinol: NOT DETECTED

## 2022-01-05 LAB — ETHANOL: Alcohol, Ethyl (B): <10 mg/dL (ref <10)

## 2022-01-05 LAB — ACETAMINOPHEN LEVEL: Acetaminophen (Tylenol), Serum: <10 ug/mL — ABNORMAL LOW (ref 10–30)

## 2022-01-05 NOTE — ED Notes (Signed)
Pt reports that she feels safe and ready to go home. Denies any medical complaints and reports that she will seek out mental health resources once she is discharged. She denies self-harm and any thoughts of suicide. Pt reports "I've got to stay positive." Pt is a/ox4 and expresses a need for transportation home.  ?

## 2022-01-05 NOTE — ED Triage Notes (Addendum)
Patient presents to ED via [facility] multiple complaints , c/o generalized bodyaches c/o dry mouth c/o spitting up blood for several days bleeding from mouth for 3 weeks. C/o being depressed ?

## 2022-01-05 NOTE — ED Provider Triage Note (Signed)
Emergency Medicine Provider Triage Evaluation Note ? ?[name] , a 51 y.o. female  was evaluated in triage.  Pt complains of depression and body aches worsening over the past 3 months. She had a h/o schizophrenia, bipolar 1, and depression. Denies any SI/HI. She reports she may have had some auditory hallucinations a few months ago, but isn't sure. Denies any visual hallucinations.  ? ?Review of Systems  ?Positive:  ?Negative:  ? ?Physical Exam  ?BP 122/90 (BP Location: Right Arm)   Pulse 89   Temp 98.2 ?F (36.8 ?C) (Oral)   Resp 18   Ht 5' 6.75" (1.695 m)   Wt 114.8 kg   SpO2 97%   BMI 39.92 kg/m?  ?Gen:   Awake, no distress   ?Resp:  Normal effort  ?MSK:   Moves extremities without difficulty  ?Other:   ? ?Medical Decision Making  ?Medically screening exam initiated at 3:18 AM.  Appropriate orders placed.  [name] was informed that the remainder of the evaluation will be completed by another provider, this initial triage assessment does not replace that evaluation, and the importance of remaining in the ED until their evaluation is complete. ? ?Will order basic labs and Consult TTS ? ?  ?Sherrell Puller, PA-C ?01/05/22 8916 ? ?

## 2022-01-05 NOTE — ED Provider Notes (Signed)
?[facility] ?Provider Note ? ? ?CSN: [id number] ?Arrival date & time: 01/05/22  0303 ? ?  ? ?History ? ?Chief Complaint  ?Patient presents with  ? Generalized Body Aches  ? ? ?[name] is a 51 y.o. female with psychiatric history significant for schizoaffective disorder bipolar type, asthma, paranoia.  Patient reports ED for "not feeling well".  When asked how long the patient has not been feeling well, the patient states "I am not sure".  Patient continues that she has been feeling very forgetful lately, forgetting where she places her keys and phone periodically. Patient denies any recent head trauma. Patient then goes on to tell a long and winding story about how she graduated recently from [employer], [employer], [place], [employer] [facility], Medical laboratory scientific officer.  Patient continues that since then, she has been having issues with her housing situation.  The patient states that she is trying to find housing for herself and her mother.  Patient then changes subject and starts talking about how she does not feel as if the behavioral health unit at [facility] is suitable for people that are not LGBTQ.  When I asked the patient if she is taking medication, the patient denies currently being medicated.  Patient states she is living in an apartment.  Patient states that she is not eating and cannot tell me why. The patient cannot tell me the last time she had a meal.  The patient denies any SI/HI, AVH.  The patient is alert and oriented x4.  Patient denies any chest pain, shortness of breath, nausea, vomiting, abdominal pain, headache.  Patient denies any pain whatsoever. ? ?HPI ? ?  ? ?Home Medications ?Prior to Admission medications   ?Medication Sig Start Date End Date Taking? Authorizing Provider  ?Ascorbic Acid (VITAMIN C WITH ROSE HIPS) 500 MG tablet Take 1,000 mg by mouth daily.    [provider]  ?atorvastatin (LIPITOR) 10 MG tablet Take 1 tablet (10 mg total) by mouth daily. 08/25/21    Rosezetta Schlatter, MD  ?cloZAPine (CLOZARIL) 25 MG tablet Take 1 tablet (25 mg total) by mouth daily. 08/25/21   Rosezetta Schlatter, MD  ?cloZAPine (CLOZARIL) 25 MG tablet Take 3 tablets (75 mg total) by mouth at bedtime. 08/25/21   Rosezetta Schlatter, MD  ?divalproex (DEPAKOTE ER) 500 MG 24 hr tablet Take 2 tablets (1,000 mg total) by mouth at bedtime. 08/25/21   Rosezetta Schlatter, MD  ?docusate sodium (COLACE) 100 MG capsule Take 1 capsule (100 mg total) by mouth daily. 08/25/21   Rosezetta Schlatter, MD  ?hydrOXYzine (VISTARIL) 25 MG capsule Take 1 capsule (25 mg total) by mouth 3 (three) times daily as needed for anxiety. 08/25/21   Rosezetta Schlatter, MD  ?ibuprofen (ADVIL) 200 MG tablet Take 400 mg by mouth every 6 (six) hours as needed for moderate pain or headache.    [provider]  ?melatonin 3 MG TABS tablet Take 1 tablet (3 mg total) by mouth at bedtime. 08/25/21   Rosezetta Schlatter, MD  ?metFORMIN (GLUCOPHAGE) 500 MG tablet Take 1 tablet (500 mg total) by mouth daily with breakfast. 08/25/21   Rosezetta Schlatter, MD  ?mometasone-formoterol (DULERA) 200-5 MCG/ACT AERO Inhale 2 puffs into the lungs 2 (two) times daily. 08/25/21   Rosezetta Schlatter, MD  ?paliperidone (INVEGA SUSTENNA) 234 MG/1.5ML SUSY injection Inject 234 mg into the muscle once for 1 dose. ?Patient taking differently: Inject 234 mg into the muscle every 21 ( twenty-one) days. 02/06/20 08/11/21  Clapacs, Jenny Reichmann  T, MD  ?ferrous sulfate 325 (65 FE) MG tablet Take 1 tablet (325 mg total) by mouth daily with breakfast. ?Patient not taking: No sig reported 12/24/18 04/20/19  [name], MD  ?   ? ?Allergies    ?Haldol [haloperidol decanoate], Penicillins, Pollen extract, and Shrimp [shellfish allergy]   ? ?Review of Systems   ?Review of Systems  ?Respiratory:  Negative for shortness of breath.   ?Cardiovascular:  Negative for chest pain.  ?Gastrointestinal:  Negative for abdominal pain, nausea and vomiting.  ?Psychiatric/Behavioral:  Negative for  hallucinations, self-injury and suicidal ideas.   ?All other systems reviewed and are negative. ? ?Physical Exam ?Updated Vital Signs ?BP (!) 124/100 (BP Location: Right Arm)   Pulse 88   Temp 98 ?F (36.7 ?C) (Oral)   Resp 16   Ht 5' 6.75" (1.695 m)   Wt 114.8 kg   SpO2 100%   BMI 39.92 kg/m?  ?Physical Exam ?Vitals and nursing note reviewed.  ?Constitutional:   ?   General: She is not in acute distress. ?   Appearance: Normal appearance. She is not ill-appearing, toxic-appearing or diaphoretic.  ?HENT:  ?   Head: Normocephalic and atraumatic.  ?   Nose: Nose normal. No congestion.  ?   Mouth/Throat:  ?   Mouth: Mucous membranes are moist.  ?   Pharynx: Oropharynx is clear.  ?Eyes:  ?   Extraocular Movements: Extraocular movements intact.  ?   Conjunctiva/sclera: Conjunctivae normal.  ?   Pupils: Pupils are equal, round, and reactive to light.  ?Cardiovascular:  ?   Rate and Rhythm: Normal rate and regular rhythm.  ?Pulmonary:  ?   Effort: Pulmonary effort is normal.  ?   Breath sounds: Normal breath sounds. No wheezing.  ?Abdominal:  ?   General: Bowel sounds are normal.  ?   Palpations: Abdomen is soft.  ?   Tenderness: There is no abdominal tenderness.  ?Musculoskeletal:  ?   Cervical back: Normal range of motion and neck supple. No tenderness.  ?Skin: ?   General: Skin is warm and dry.  ?   Capillary Refill: Capillary refill takes less than 2 seconds.  ?Neurological:  ?   Mental Status: She is alert. She is disoriented.  ?Psychiatric:     ?   Attention and Perception: She is inattentive. She does not perceive auditory or visual hallucinations.     ?   Mood and Affect: Affect is flat.     ?   Speech: Speech is tangential.     ?   Behavior: Behavior is slowed.     ?   Thought Content: Thought content is delusional. Thought content does not include homicidal or suicidal ideation. Thought content does not include homicidal or suicidal plan.  ? ? ?ED Results / Procedures / Treatments   ?Labs ?(all labs ordered  are listed, but only abnormal results are displayed) ?Labs Reviewed  ?COMPREHENSIVE METABOLIC PANEL - Abnormal; Notable for the following components:  ?    Result Value  ? Glucose, Bld 107 (*)   ? All other components within normal limits  ?CBC WITH DIFFERENTIAL/PLATELET - Abnormal; Notable for the following components:  ? Hemoglobin 11.6 (*)   ? All other components within normal limits  ?ACETAMINOPHEN LEVEL - Abnormal; Notable for the following components:  ? Acetaminophen (Tylenol), Serum <10 (*)   ? All other components within normal limits  ?SALICYLATE LEVEL - Abnormal; Notable for the following components:  ? Salicylate Lvl <1.9 (*)   ?  All other components within normal limits  ?RESP PANEL BY RT-PCR (FLU A&B, COVID) ARPGX2  ?ETHANOL  ?RAPID URINE DRUG SCREEN, HOSP PERFORMED  ?I-STAT BETA HCG BLOOD, ED ([facility], [facility], [facility] ONLY)  ? ? ?EKG ?None ? ?Radiology ?No results found. ? ?Procedures ?Procedures  ? ? ?Medications Ordered in ED ?Medications - No data to display ? ?ED Course/ Medical Decision Making/ A&P ?  ?                        ?Medical Decision Making ? ?51 year old female presents ED for evaluation.  Please see HPI for further details. ? ?On examination, patient is afebrile, nontachycardic, nonhypoxic.  Patient lung sounds are clear bilaterally.  Patient abdomen soft compressible all 4 quadrants.  Patient alert and orient x4.  Patient denies SI/HI, AVH.  Patient denies being currently medicated. ? ?Patient worked up utilizing the following labs and imaging studies interpreted by me personally: ?- Ethanol less than 10 ?- Salicylate less than 7 ?- Acetaminophen less than 10 ?- CBC shows hemoglobin of 11.6, on chart review this is patient baseline ?- CMP unremarkable ?- Respiratory panel negative ?- Rapid urine drug screen negative ?- EKG sinus rhythm ? ?Patient medically cleared.  TTS consult placed.  TOC consult placed as well due to patient requesting social worker. ? ?Update: 9:16 AM-nursing staff just  notified me that the patient has eloped.  Patient cannot be found anywhere in the department. ? ?Patient eloped. ? ? ?Final Clinical Impression(s) / ED Diagnoses ?Final diagnoses:  ?Encounter for psychological evaluation  ?

## 2022-01-13 ENCOUNTER — Other Ambulatory Visit: Payer: Self-pay

## 2022-01-13 ENCOUNTER — Encounter (HOSPITAL_COMMUNITY): Payer: Self-pay

## 2022-01-13 ENCOUNTER — Emergency Department (HOSPITAL_COMMUNITY)
Admission: EM | Admit: 2022-01-13 | Discharge: 2022-01-13 | Discharge status: Against Medical Advice | Payer: Medicare HMO | Attending: Emergency Medicine | Admitting: Emergency Medicine

## 2022-01-13 DIAGNOSIS — R519 Headache, unspecified: Secondary | ICD-10-CM | POA: Diagnosis present

## 2022-01-13 DIAGNOSIS — Z5321 Procedure and treatment not carried out due to patient leaving prior to being seen by health care provider: Secondary | ICD-10-CM | POA: Diagnosis not present

## 2022-01-13 NOTE — ED Triage Notes (Signed)
Pt BIB EMS stating that she just doesn't feel well. Pt states that she has a headache.

## 2022-01-25 ENCOUNTER — Ambulatory Visit (HOSPITAL_COMMUNITY)
Admission: RE | Admit: 2022-01-25 | Discharge: 2022-01-25 | Disposition: A | Discharge status: Home / Self Care | Payer: Medicare HMO | Attending: Psychiatry | Admitting: Psychiatry
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
## 2022-02-01 ENCOUNTER — Ambulatory Visit (HOSPITAL_COMMUNITY)
Admission: AD | Admit: 2022-02-01 | Discharge: 2022-02-01 | Disposition: A | Discharge status: Home / Self Care | Payer: Medicare HMO | Attending: Psychiatry | Admitting: Psychiatry

## 2022-02-01 NOTE — H&P (Signed)
Behavioral Health Medical Screening Exam  [name] is a 51 y.o. female.  With a history of schizophrenia, bipolar disorder, anxiety, depression.  Presented to be [facility] with complaint of anxiety.  Per the patient they evict me from my apartment they say that I hold them $450.51.   Face-to-face assessment of patient she is alert and oriented to person place and time, speech is clear but word salad.  Patient denies SI, HI, AVH.  Patient goes off on a tantrum stating from [place] 25 minutes up the road.  Patient stated I am not gay, I am not Hispanic, i'm noy white.  My mother do not have money but she probably dead.  Patient continues to reminisce.  Patient states that I take Invega every 3 months and I am not due for it until June 23 so I do not want any medicine because I do want to stay in the hospital for 2,3 and 4 weeks.  Patient is fixated on her rent not being paid and she is getting section 8 and they have not paid her rent.  According to patient I drove myself down here and I am fixing to go back home now.   Recommend discharge home   Total Time spent with patient: 20 minutes  Psychiatric Specialty Exam:  Presentation  General Appearance: Appropriate for Environment  Eye Contact:Fair  Speech:Garbled  Speech Volume:Normal  Handedness:Ambidextrous   Mood and Affect  Mood:Anxious  Affect:Restricted   Thought Process  Thought Processes:Linear  Descriptions of Associations:Intact  Orientation:Full (Time, Place and Person)  Thought Content:Rumination  History of Schizophrenia/Schizoaffective disorder:Yes  Duration of Psychotic Symptoms:Greater than six months  Hallucinations:Hallucinations: None  Ideas of Reference:None  Suicidal Thoughts:Suicidal Thoughts: No  Homicidal Thoughts:Homicidal Thoughts: No   Sensorium  Memory:Immediate Fair  Judgment:Fair  Insight:Fair   Executive Functions  Concentration:Fair  Attention Span:Fair  [place]  Language:Good   Psychomotor Activity  Psychomotor Activity:Psychomotor Activity: Normal   Assets  Assets:Desire for Improvement   Sleep  Sleep:Sleep: Good    Physical Exam: Physical Exam HENT:     Head: Normocephalic.     Nose: Nose normal.  Cardiovascular:     Rate and Rhythm: Normal rate.  Pulmonary:     Effort: Pulmonary effort is normal.  Musculoskeletal:        General: Normal range of motion.     Cervical back: Normal range of motion.  Skin:    General: Skin is warm.  Neurological:     General: No focal deficit present.     Mental Status: She is alert.  Psychiatric:        Mood and Affect: Mood normal.        Behavior: Behavior normal.        Thought Content: Thought content normal.        Judgment: Judgment normal.   Review of Systems  Constitutional: Negative.   HENT: Negative.    Eyes: Negative.   Respiratory: Negative.    Cardiovascular: Negative.   Gastrointestinal: Negative.   Genitourinary: Negative.   Musculoskeletal: Negative.   Skin: Negative.   Neurological: Negative.   Endo/Heme/Allergies: Negative.   Psychiatric/Behavioral:  The patient is nervous/anxious.   There were no vitals taken for this visit. There is no height or weight on file to calculate BMI.  Musculoskeletal: Strength & Muscle Tone: within normal limits Gait & Station: normal Patient leans: N/A   Recommendations:  Based on my evaluation the patient does not appear to  have an emergency medical condition.  [name], NP 02/01/2022, 1:38 AM

## 2022-02-10 ENCOUNTER — Emergency Department (HOSPITAL_COMMUNITY)
Admission: EM | Admit: 2022-02-10 | Discharge: 2022-02-10 | Disposition: A | Discharge status: Home / Self Care | Payer: Medicare HMO | Attending: Emergency Medicine | Admitting: Emergency Medicine

## 2022-02-10 ENCOUNTER — Encounter (HOSPITAL_COMMUNITY): Payer: Self-pay

## 2022-02-10 DIAGNOSIS — R079 Chest pain, unspecified: Secondary | ICD-10-CM | POA: Diagnosis not present

## 2022-02-10 DIAGNOSIS — M549 Dorsalgia, unspecified: Secondary | ICD-10-CM | POA: Insufficient documentation

## 2022-02-10 DIAGNOSIS — R110 Nausea: Secondary | ICD-10-CM | POA: Insufficient documentation

## 2022-02-10 DIAGNOSIS — Z79899 Other long term (current) drug therapy: Secondary | ICD-10-CM | POA: Insufficient documentation

## 2022-02-10 LAB — URINALYSIS, ROUTINE W REFLEX MICROSCOPIC
Bilirubin Urine: NEGATIVE
Glucose, UA: NEGATIVE mg/dL
Ketones, ur: NEGATIVE mg/dL
Leukocytes,Ua: NEGATIVE
Nitrite: NEGATIVE
Protein, ur: NEGATIVE mg/dL
Specific Gravity, Urine: 1.015 (ref 1.005–1.030)
pH: 5 (ref 5.0–8.0)

## 2022-02-10 LAB — COMPREHENSIVE METABOLIC PANEL
ALT: 20 U/L (ref 0–44)
AST: 22 U/L (ref 15–41)
Albumin: 3.8 g/dL (ref 3.5–5.0)
Alkaline Phosphatase: 64 U/L (ref 38–126)
Anion gap: 7 (ref 5–15)
BUN: 6 mg/dL (ref 6–20)
CO2: 23 mmol/L (ref 22–32)
Calcium: 8.9 mg/dL (ref 8.9–10.3)
Chloride: 109 mmol/L (ref 98–111)
Creatinine, Ser: 0.78 mg/dL (ref 0.44–1.00)
GFR, Estimated: >60 mL/min (ref >60)
Glucose, Bld: 102 mg/dL — ABNORMAL HIGH (ref 70–99)
Potassium: 3.4 mmol/L — ABNORMAL LOW (ref 3.5–5.1)
Sodium: 139 mmol/L (ref 135–145)
Total Bilirubin: 0.5 mg/dL (ref 0.3–1.2)
Total Protein: 7.1 g/dL (ref 6.5–8.1)

## 2022-02-10 LAB — CBC
HCT: 33.7 % — ABNORMAL LOW (ref 36.0–46.0)
Hemoglobin: 10.9 g/dL — ABNORMAL LOW (ref 12.0–15.0)
MCH: 28.5 pg (ref 26.0–34.0)
MCHC: 32.3 g/dL (ref 30.0–36.0)
MCV: 88.2 fL (ref 80.0–100.0)
Platelets: 272 10*3/uL (ref 150–400)
RBC: 3.82 MIL/uL — ABNORMAL LOW (ref 3.87–5.11)
RDW: 13.8 % (ref 11.5–15.5)
WBC: 5.4 10*3/uL (ref 4.0–10.5)
nRBC: 0 % (ref 0.0–0.2)

## 2022-02-10 LAB — TROPONIN I (HIGH SENSITIVITY): Troponin I (High Sensitivity): 4 ng/L (ref <18)

## 2022-02-10 LAB — LIPASE, BLOOD: Lipase: 27 U/L (ref 11–51)

## 2022-02-10 LAB — I-STAT BETA HCG BLOOD, ED (MC, WL, AP ONLY): I-stat hCG, quantitative: <5 m[IU]/mL (ref <5)

## 2022-02-10 NOTE — ED Triage Notes (Signed)
Pt comes via [facility] for nausea and low back pain that has been going on for a few days.

## 2022-02-10 NOTE — ED Provider Notes (Signed)
[facility] EMERGENCY DEPARTMENT Provider Note   CSN: [id number] Arrival date & time: 02/10/22  0344     History  Chief Complaint  Patient presents with   Back Pain   Nausea    [name] is a 51 y.o. female.  51 y.o female with a Schizoaffective disorder presents to the ED with a chief complaint of having problems 3.5 weeks of pain along her left breast.  No alleviating or exacerbating factors.  She is very tangential about her speech.  She is talking to me about her anemia problem, also how we drew blood to find out her DNA?, she is asking for [place] to check for COVIDE?Marland Kitchen No alleviating or exacerbating factors.    The history is provided by the patient and medical records.  Back Pain Associated symptoms: chest pain   Associated symptoms: no fever and no headaches        Home Medications Prior to Admission medications   Medication Sig Start Date End Date Taking? Authorizing Provider  Ascorbic Acid (VITAMIN C WITH ROSE HIPS) 500 MG tablet Take 1,000 mg by mouth daily.    [provider]  atorvastatin (LIPITOR) 10 MG tablet Take 1 tablet (10 mg total) by mouth daily. 08/25/21   Lamar Sprinkles, MD  cloZAPine (CLOZARIL) 25 MG tablet Take 1 tablet (25 mg total) by mouth daily. 08/25/21   Lamar Sprinkles, MD  cloZAPine (CLOZARIL) 25 MG tablet Take 3 tablets (75 mg total) by mouth at bedtime. 08/25/21   Lamar Sprinkles, MD  divalproex (DEPAKOTE ER) 500 MG 24 hr tablet Take 2 tablets (1,000 mg total) by mouth at bedtime. 08/25/21   Lamar Sprinkles, MD  docusate sodium (COLACE) 100 MG capsule Take 1 capsule (100 mg total) by mouth daily. 08/25/21   Lamar Sprinkles, MD  hydrOXYzine (VISTARIL) 25 MG capsule Take 1 capsule (25 mg total) by mouth 3 (three) times daily as needed for anxiety. 08/25/21   Lamar Sprinkles, MD  ibuprofen (ADVIL) 200 MG tablet Take 400 mg by mouth every 6 (six) hours as needed for moderate pain or headache.    [provider]   melatonin 3 MG TABS tablet Take 1 tablet (3 mg total) by mouth at bedtime. 08/25/21   Lamar Sprinkles, MD  metFORMIN (GLUCOPHAGE) 500 MG tablet Take 1 tablet (500 mg total) by mouth daily with breakfast. 08/25/21   Lamar Sprinkles, MD  mometasone-formoterol (DULERA) 200-5 MCG/ACT AERO Inhale 2 puffs into the lungs 2 (two) times daily. 08/25/21   Lamar Sprinkles, MD  paliperidone (INVEGA SUSTENNA) 234 MG/1.5ML SUSY injection Inject 234 mg into the muscle once for 1 dose. Patient taking differently: Inject 234 mg into the muscle every 21 ( twenty-one) days. 02/06/20 08/11/21  Clapacs, Jackquline Denmark, MD  ferrous sulfate 325 (65 FE) MG tablet Take 1 tablet (325 mg total) by mouth daily with breakfast. Patient not taking: No sig reported 12/24/18 04/20/19  Malvin Johns, MD      Allergies    Haldol [haloperidol decanoate], Penicillins, Pollen extract, and Shrimp [shellfish allergy]    Review of Systems   Review of Systems  Constitutional:  Negative for chills and fever.  HENT:  Negative for sore throat.   Respiratory:  Negative for shortness of breath.   Cardiovascular:  Positive for chest pain.  Gastrointestinal:  Negative for nausea and vomiting.  Musculoskeletal:  Positive for back pain.  Neurological:  Negative for light-headedness and headaches.  All other systems reviewed and are negative.   Physical  Exam Updated Vital Signs BP (!) 136/108 (BP Location: Right Arm)   Pulse 66   Temp 97.7 F (36.5 C) (Oral)   Resp 18   SpO2 100%  Physical Exam Vitals and nursing note reviewed.  Constitutional:      Appearance: Normal appearance.  HENT:     Head: Normocephalic and atraumatic.     Nose: No congestion.     Mouth/Throat:     Mouth: Mucous membranes are moist.  Eyes:     Pupils: Pupils are equal, round, and reactive to light.  Cardiovascular:     Rate and Rhythm: Normal rate.  Pulmonary:     Effort: Pulmonary effort is normal.     Breath sounds: No wheezing.  Abdominal:     General:  Abdomen is flat.  Musculoskeletal:     Cervical back: Normal range of motion and neck supple.  Skin:    General: Skin is warm and dry.  Neurological:     Mental Status: She is alert and oriented to person, place, and time.  Psychiatric:        Attention and Perception: She is inattentive.        Speech: Speech is tangential.        Thought Content: Thought content is delusional.     ED Results / Procedures / Treatments   Labs (all labs ordered are listed, but only abnormal results are displayed) Labs Reviewed  COMPREHENSIVE METABOLIC PANEL - Abnormal; Notable for the following components:      Result Value   Potassium 3.4 (*)    Glucose, Bld 102 (*)    All other components within normal limits  CBC - Abnormal; Notable for the following components:   RBC 3.82 (*)    Hemoglobin 10.9 (*)    HCT 33.7 (*)    All other components within normal limits  URINALYSIS, ROUTINE W REFLEX MICROSCOPIC - Abnormal; Notable for the following components:   APPearance HAZY (*)    Hgb urine dipstick SMALL (*)    Bacteria, UA RARE (*)    All other components within normal limits  LIPASE, BLOOD  RAPID URINE DRUG SCREEN, HOSP PERFORMED  I-STAT BETA HCG BLOOD, ED ([facility], [facility], [facility] ONLY)  TROPONIN I (HIGH SENSITIVITY)  TROPONIN I (HIGH SENSITIVITY)    EKG None  Radiology No results found.  Procedures Procedures    Medications Ordered in ED Medications - No data to display  ED Course/ Medical Decision Making/ A&P                           Medical Decision Making Amount and/or Complexity of Data Reviewed Labs: ordered.    Patient here with multiple complaints including back pain, nausea, chest pain, DNA testing?. Patient is very vague about her symptoms but she did mention pain to the left side of her chest, however is very hard to obtain history from her.  She is also telling me about DNA testing, ordering also COVIDE testing, I am not sure what exactly she is requesting.  She is overall  well-appearing, blood pressure slightly elevated.  Lungs are clear to auscultation, abdomen is soft nontender to palpation.  No CVA tenderness bilaterally.  No urinary symptoms.  I have extensively reviewed chart and see patient has prior history of schizoaffective sorter.  She is without any SI or HI on today's visit.  No visual or auditory hallucinations, however speech is very tangential and although she is unable  to actually focus.  Does not appear in any distress.    Blood work interpreted by me no leukocytosis, hemoglobin is within normal limits at baseline with her anemia. CMP with no electrolyte derangement aside for some slight decrease in potassium.  Creatinine levels within normal limits.  LFTs are unremarkable.  First troponin is 4, pain has been ongoing for I am not sure the time span but she is unable to report this.  She is very tangential during her entire HPI.  hCG is negative.  UA with rare bacteria, no nitrites or leukocytes to suggest infection.  These results were discussed with patient, she is overall well-appearing, not in any distress.  With stable vital signs I do feel the patient can follow-up with PCP.  Patient shows agrees to management, return precautions discussed at length.    Portions of this note were generated with Scientist, clinical (histocompatibility and immunogenetics). Dictation errors may occur despite best attempts at proofreading.   Final Clinical Impression(s) / ED Diagnoses Final diagnoses:  Nausea  Back pain, unspecified back location, unspecified back pain laterality, unspecified chronicity    Rx / DC Orders ED Discharge Orders     None         Claude Manges, PA-C 02/10/22 1610    Gwyneth Sprout, MD 02/14/22 [phone]

## 2022-02-10 NOTE — Discharge Instructions (Addendum)
Your laboratory results are within normal limits.  You will need to follow-up with your primary care physician as needed

## 2022-03-12 ENCOUNTER — Emergency Department (HOSPITAL_COMMUNITY): Payer: Medicare Other

## 2022-03-12 ENCOUNTER — Other Ambulatory Visit: Payer: Self-pay

## 2022-03-12 ENCOUNTER — Emergency Department (HOSPITAL_COMMUNITY)
Admission: EM | Admit: 2022-03-12 | Discharge: 2022-03-12 | Disposition: A | Discharge status: Home / Self Care | Payer: Medicare Other | Attending: Emergency Medicine | Admitting: Emergency Medicine

## 2022-03-12 DIAGNOSIS — W06XXXA Fall from bed, initial encounter: Secondary | ICD-10-CM | POA: Insufficient documentation

## 2022-03-12 DIAGNOSIS — Z87891 Personal history of nicotine dependence: Secondary | ICD-10-CM | POA: Insufficient documentation

## 2022-03-12 DIAGNOSIS — M542 Cervicalgia: Secondary | ICD-10-CM | POA: Insufficient documentation

## 2022-03-12 DIAGNOSIS — I10 Essential (primary) hypertension: Secondary | ICD-10-CM | POA: Insufficient documentation

## 2022-03-12 DIAGNOSIS — S0990XA Unspecified injury of head, initial encounter: Secondary | ICD-10-CM

## 2022-03-12 DIAGNOSIS — J45909 Unspecified asthma, uncomplicated: Secondary | ICD-10-CM | POA: Insufficient documentation

## 2022-03-12 MED ORDER — ACETAMINOPHEN 500 MG PO TABS
1000.0000 mg | ORAL_TABLET | Freq: Once | ORAL | Status: AC
  Administered 2022-03-12: 1000 mg via ORAL
  Filled 2022-03-12: qty 2

## 2022-03-12 NOTE — ED Notes (Signed)
Provider at bedside, pt refused CT.

## 2022-03-12 NOTE — Discharge Instructions (Signed)
You were evaluated in the Emergency Department and after careful evaluation, we did not find any emergent condition requiring admission or further testing in the hospital.  Your exam/testing today was overall reassuring.  You declined CT imaging today.  Please return to the Emergency Department if you experience any worsening of your condition.  Thank you for allowing [place] to be a part of your care.

## 2022-03-12 NOTE — ED Triage Notes (Signed)
Patient BIB [facility] for possible fall.  EMS was called for "sick person."  Patient reports she "may or may not have fallen."  Hx of schizoaffective.

## 2022-03-12 NOTE — Progress Notes (Signed)
Patient refused her CT scan's.  Per patient "She thinks the scan will change her gender from female to female" and would not cooperate for exam.  RN and MD notified

## 2022-03-12 NOTE — ED Provider Notes (Addendum)
[facility] Emergency Department Provider Note MRN:  [id number]  Arrival date & time: 03/12/22     Chief Complaint   Fall   History of Present Illness   [name] is a 51 y.o. year-old female with a history of bipolar disorder presenting to the ED with chief complaint of fall.  Patient thinks maybe she fell out of her bed and hit her head on the floor.  She is endorsing pain to the back of the head, neck pain.  Denies any other injuries or complaints.  No chest pain or shortness of breath, no abdominal pain, no injuries to the arms or legs.  Review of Systems  A thorough review of systems was obtained and all systems are negative except as noted in the HPI and PMH.   [name] Health History    Past Medical History:  Diagnosis Date   Anxiety    Asthma    Bipolar 1 disorder ([place])    Depression    Gallstones 08/2020   Hypertension    Insomnia, persistent    Prediabetes    Schizophrenic disorder (HCC)    Seizures (HCC)     Past Surgical History:  Procedure Laterality Date   BREAST LUMPECTOMY Left 07/2013   TONSILLECTOMY     TUBAL LIGATION  2002    Family History  Problem Relation Age of Onset   Depression Mother    Gout Mother    Cancer Father        prostate   Other Father        lung issue   Alcoholism Other    Heart attack Paternal Grandfather    Heart attack Paternal Grandmother    Heart attack Maternal Grandmother    Heart attack Maternal Grandfather    Depression Son    Anxiety disorder Son     Social History   Socioeconomic History   Marital status: Single    Spouse name: Not on file   Number of children: Not on file   Years of education: Not on file   Highest education level: Not on file  Occupational History   Not on file  Tobacco Use   Smoking status: Former    Types: Cigarettes   Smokeless tobacco: Never  Vaping Use   Vaping Use: Never used  Substance and Sexual Activity   Alcohol use: Not Currently   Drug  use: No   Sexual activity: Not Currently  Other Topics Concern   Not on file  Social History Narrative   Not on file   Social Determinants of Health   Financial Resource Strain: Not on file  Food Insecurity: Food Insecurity Present (09/16/2020)   Hunger Vital Sign    Worried About Running Out of Food in the Last Year: Sometimes true    Ran Out of Food in the Last Year: Sometimes true  Transportation Needs: No Transportation Needs (09/16/2020)   PRAPARE - Hydrologist (Medical): No    Lack of Transportation (Non-Medical): No  Physical Activity: Not on file  Stress: Not on file  Social Connections: Not on file  Intimate Partner Violence: Not on file     Physical Exam   Vitals:   03/12/22 0508  BP: 100/72  Pulse: 80  Resp: 18  Temp: 98 F (36.7 C)  SpO2: 99%    CONSTITUTIONAL: Well-appearing, NAD NEURO/PSYCH:  Alert and oriented x 3, no focal deficits, speech a bit disorganized EYES:  eyes equal and reactive  ENT/NECK:  no LAD, no JVD CARDIO: Regular rate, well-perfused, normal S1 and S2 PULM:  CTAB no wheezing or rhonchi GI/GU:  non-distended, non-tender MSK/SPINE:  No gross deformities, no edema SKIN:  no rash, atraumatic   *Additional and/or pertinent findings included in MDM below  Diagnostic and Interventional Summary    EKG Interpretation  Date/Time:    Ventricular Rate:    PR Interval:    QRS Duration:   QT Interval:    QTC Calculation:   R Axis:     Text Interpretation:         Labs Reviewed - No data to display  CT HEAD WO CONTRAST (5MM)    (Results Pending)  CT CERVICAL SPINE WO CONTRAST    (Results Pending)    Medications  acetaminophen (TYLENOL) tablet 1,000 mg (1,000 mg Oral Given 03/12/22 0559)     Procedures  /  Critical Care Procedures  ED Course and Medical Decision Making  Initial Impression and Ddx Patient is a poor historian and is a bit disorganized likely as result of her baseline psychiatric  illness.  Suspect she fell out of bed, has some tenderness to the back of the head, mild reduced range of motion of the neck due to pain.  Otherwise nontraumatic exam, normal vital signs, she is not having a psychiatric crisis.  Will obtain CT imaging to exclude emergent traumatic injury, anticipating discharge.  Past medical/surgical history that increases complexity of ED encounter: Bipolar disorder  Interpretation of Diagnostics Awaiting CT imaging  Patient Reassessment and Ultimate Disposition/Management    Patient refusing CT imaging.  On reassessment she is doing well, per candian ct guidelines CT imaging no longer indicated.  Appropriate for dc with reassurance.  Patient management required discussion with the following services or consulting groups:  None  Complexity of Problems Addressed Acute illness or injury that poses threat of life of bodily function  Additional Data Reviewed and Analyzed Further history obtained from: None  Additional Factors Impacting ED Encounter Risk None  Barth Kirks. Sedonia Small, [place] [email or web address]  Final Clinical Impressions(s) / ED Diagnoses     ICD-10-CM   1. Injury of head, initial encounter  S09.90XA       ED Discharge Orders     None        Discharge Instructions Discussed with and Provided to Patient:     Discharge Instructions      You were evaluated in the Emergency Department and after careful evaluation, we did not find any emergent condition requiring admission or further testing in the hospital.  Your exam/testing today was overall reassuring.  You declined CT imaging today.  Please return to the Emergency Department if you experience any worsening of your condition.  Thank you for allowing [place] to be a part of your care.        Maudie Flakes, MD 03/12/22 9604    Maudie Flakes, MD 03/12/22 [phone]

## 2022-03-24 ENCOUNTER — Emergency Department (HOSPITAL_COMMUNITY)
Admission: EM | Admit: 2022-03-24 | Discharge: 2022-03-27 | Disposition: A | Discharge status: Home / Self Care | Payer: Medicare Other | Attending: Emergency Medicine | Admitting: Emergency Medicine

## 2022-03-24 ENCOUNTER — Emergency Department (HOSPITAL_COMMUNITY): Payer: Medicare Other

## 2022-03-24 ENCOUNTER — Other Ambulatory Visit: Payer: Self-pay

## 2022-03-24 DIAGNOSIS — F22 Delusional disorders: Secondary | ICD-10-CM | POA: Diagnosis present

## 2022-03-24 DIAGNOSIS — M25572 Pain in left ankle and joints of left foot: Secondary | ICD-10-CM | POA: Insufficient documentation

## 2022-03-24 DIAGNOSIS — F250 Schizoaffective disorder, bipolar type: Secondary | ICD-10-CM | POA: Diagnosis present

## 2022-03-24 DIAGNOSIS — Z87891 Personal history of nicotine dependence: Secondary | ICD-10-CM | POA: Diagnosis not present

## 2022-03-24 DIAGNOSIS — Z79899 Other long term (current) drug therapy: Secondary | ICD-10-CM | POA: Diagnosis not present

## 2022-03-24 DIAGNOSIS — Z91199 Patient's noncompliance with other medical treatment and regimen due to unspecified reason: Secondary | ICD-10-CM

## 2022-03-24 DIAGNOSIS — Z9151 Personal history of suicidal behavior: Secondary | ICD-10-CM | POA: Diagnosis not present

## 2022-03-24 DIAGNOSIS — F201 Disorganized schizophrenia: Secondary | ICD-10-CM | POA: Diagnosis not present

## 2022-03-24 LAB — CBC WITH DIFFERENTIAL/PLATELET
Abs Immature Granulocytes: 0.01 10*3/uL (ref 0.00–0.07)
Basophils Absolute: 0 10*3/uL (ref 0.0–0.1)
Basophils Relative: 0 %
Eosinophils Absolute: 0.3 10*3/uL (ref 0.0–0.5)
Eosinophils Relative: 4 %
HCT: 32.5 % — ABNORMAL LOW (ref 36.0–46.0)
Hemoglobin: 10.5 g/dL — ABNORMAL LOW (ref 12.0–15.0)
Immature Granulocytes: 0 %
Lymphocytes Relative: 33 %
Lymphs Abs: 2.1 10*3/uL (ref 0.7–4.0)
MCH: 28.9 pg (ref 26.0–34.0)
MCHC: 32.3 g/dL (ref 30.0–36.0)
MCV: 89.5 fL (ref 80.0–100.0)
Monocytes Absolute: 0.5 10*3/uL (ref 0.1–1.0)
Monocytes Relative: 7 %
Neutro Abs: 3.6 10*3/uL (ref 1.7–7.7)
Neutrophils Relative %: 56 %
Platelets: 228 10*3/uL (ref 150–400)
RBC: 3.63 MIL/uL — ABNORMAL LOW (ref 3.87–5.11)
RDW: 14.6 % (ref 11.5–15.5)
WBC: 6.4 10*3/uL (ref 4.0–10.5)
nRBC: 0 % (ref 0.0–0.2)

## 2022-03-24 MED ORDER — CLOZAPINE 25 MG PO TABS
75.0000 mg | ORAL_TABLET | Freq: Every day | ORAL | Status: DC
  Administered 2022-03-24: 75 mg via ORAL
  Filled 2022-03-24: qty 3

## 2022-03-24 MED ORDER — OLANZAPINE 10 MG PO TABS
10.0000 mg | ORAL_TABLET | Freq: Every day | ORAL | Status: DC
  Administered 2022-03-24 – 2022-03-26 (×3): 10 mg via ORAL
  Filled 2022-03-24 (×3): qty 1

## 2022-03-24 MED ORDER — MOMETASONE FURO-FORMOTEROL FUM 200-5 MCG/ACT IN AERO
2.0000 | INHALATION_SPRAY | Freq: Two times a day (BID) | RESPIRATORY_TRACT | Status: DC
  Administered 2022-03-25 – 2022-03-27 (×5): 2 via RESPIRATORY_TRACT
  Filled 2022-03-24: qty 8.8

## 2022-03-24 MED ORDER — IBUPROFEN 800 MG PO TABS
800.0000 mg | ORAL_TABLET | Freq: Once | ORAL | Status: AC
Start: 2022-03-24 — End: 2022-03-24
  Administered 2022-03-24: 800 mg via ORAL
  Filled 2022-03-24: qty 1

## 2022-03-24 MED ORDER — METFORMIN HCL 500 MG PO TABS
500.0000 mg | ORAL_TABLET | Freq: Every day | ORAL | Status: DC
  Administered 2022-03-25 – 2022-03-27 (×3): 500 mg via ORAL
  Filled 2022-03-24 (×3): qty 1

## 2022-03-24 MED ORDER — ATORVASTATIN CALCIUM 10 MG PO TABS
10.0000 mg | ORAL_TABLET | Freq: Every day | ORAL | Status: DC
  Administered 2022-03-24 – 2022-03-27 (×4): 10 mg via ORAL
  Filled 2022-03-24 (×4): qty 1

## 2022-03-24 MED ORDER — MOMETASONE FURO-FORMOTEROL FUM 200-5 MCG/ACT IN AERO: 2.0000 | INHALATION_SPRAY | Freq: Two times a day (BID) | RESPIRATORY_TRACT | Status: DC

## 2022-03-24 MED ORDER — DIVALPROEX SODIUM ER 500 MG PO TB24
1000.0000 mg | ORAL_TABLET | Freq: Every day | ORAL | Status: DC
  Administered 2022-03-24 – 2022-03-26 (×3): 1000 mg via ORAL
  Filled 2022-03-24 (×4): qty 2

## 2022-03-24 MED ORDER — IBUPROFEN 200 MG PO TABS: 400.0000 mg | ORAL_TABLET | Freq: Four times a day (QID) | ORAL | Status: DC | PRN

## 2022-03-24 MED ORDER — CLOZAPINE 25 MG PO TABS
25.0000 mg | ORAL_TABLET | Freq: Every day | ORAL | Status: DC
  Filled 2022-03-24: qty 1

## 2022-03-24 MED ORDER — MELATONIN 3 MG PO TABS
3.0000 mg | ORAL_TABLET | Freq: Every day | ORAL | Status: DC
  Administered 2022-03-24 – 2022-03-26 (×3): 3 mg via ORAL
  Filled 2022-03-24 (×3): qty 1

## 2022-03-24 MED ORDER — DOCUSATE SODIUM 100 MG PO CAPS
100.0000 mg | ORAL_CAPSULE | Freq: Every day | ORAL | Status: DC
  Administered 2022-03-24 – 2022-03-27 (×4): 100 mg via ORAL
  Filled 2022-03-24 (×4): qty 1

## 2022-03-24 NOTE — ED Provider Notes (Signed)
[name] Provider Note   CSN: [id number] Arrival date & time: 03/24/22  1834     History  Chief Complaint  Patient presents with   Altered Mental Status   Ankle Pain    [name] is a 51 y.o. female.  51 yo F with a chief complaints of paranoia.  The patient was found wandering around talking about conspiracies.  She was then brought here for evaluation.  Was complaining of some left ankle pain.    Reportedly was seen at her doctor's office for this and had an x-ray performed.   Altered Mental Status Ankle Pain      Home Medications Prior to Admission medications   Medication Sig Start Date End Date Taking? Authorizing Provider  Ascorbic Acid (VITAMIN C WITH ROSE HIPS) 500 MG tablet Take 1,000 mg by mouth daily.    [provider]  atorvastatin (LIPITOR) 10 MG tablet Take 1 tablet (10 mg total) by mouth daily. 08/25/21   Rosezetta Schlatter, MD  budesonide-formoterol [facility]) 160-4.5 MCG/ACT inhaler SMARTSIG:2 Puff(s) By Mouth Twice Daily 02/20/22   [provider]  cloZAPine (CLOZARIL) 25 MG tablet Take 1 tablet (25 mg total) by mouth daily. 08/25/21   Rosezetta Schlatter, MD  cloZAPine (CLOZARIL) 25 MG tablet Take 3 tablets (75 mg total) by mouth at bedtime. 08/25/21   Rosezetta Schlatter, MD  divalproex (DEPAKOTE ER) 500 MG 24 hr tablet Take 2 tablets (1,000 mg total) by mouth at bedtime. 08/25/21   Rosezetta Schlatter, MD  docusate sodium (COLACE) 100 MG capsule Take 1 capsule (100 mg total) by mouth daily. 08/25/21   Rosezetta Schlatter, MD  hydrOXYzine (VISTARIL) 25 MG capsule Take 1 capsule (25 mg total) by mouth 3 (three) times daily as needed for anxiety. 08/25/21   Rosezetta Schlatter, MD  ibuprofen (ADVIL) 200 MG tablet Take 400 mg by mouth every 6 (six) hours as needed for moderate pain or headache.    [provider]  melatonin 3 MG TABS tablet Take 1 tablet (3 mg total) by mouth at bedtime. 08/25/21   Rosezetta Schlatter,  MD  metFORMIN (GLUCOPHAGE) 500 MG tablet Take 1 tablet (500 mg total) by mouth daily with breakfast. 08/25/21   Rosezetta Schlatter, MD  mometasone-formoterol (DULERA) 200-5 MCG/ACT AERO Inhale 2 puffs into the lungs 2 (two) times daily. 08/25/21   Rosezetta Schlatter, MD  OLANZapine (ZYPREXA) 10 MG tablet Take 10 mg by mouth at bedtime. 01/16/22   [provider]  paliperidone (INVEGA SUSTENNA) 234 MG/1.5ML SUSY injection Inject 234 mg into the muscle once for 1 dose. Patient taking differently: Inject 234 mg into the muscle every 21 ( twenty-one) days. 02/06/20 08/11/21  Clapacs, [name], MD  ferrous sulfate 325 (65 FE) MG tablet Take 1 tablet (325 mg total) by mouth daily with breakfast. Patient not taking: No sig reported 12/24/18 04/20/19  [name], MD      Allergies    Haldol [haloperidol decanoate], Penicillins, Pollen extract, and Shrimp [shellfish allergy]    Review of Systems   Review of Systems  Physical Exam Updated Vital Signs BP 128/88 (BP Location: Right Arm)   Pulse 88   Temp 97.6 F (36.4 C) (Oral)   Resp 16   SpO2 100%  Physical Exam Vitals and nursing note reviewed.  Constitutional:      General: She is not in acute distress.    Appearance: She is well-developed. She is not diaphoretic.  HENT:     Head: Normocephalic and  atraumatic.  Eyes:     Pupils: Pupils are equal, round, and reactive to light.  Cardiovascular:     Rate and Rhythm: Normal rate and regular rhythm.     Heart sounds: No murmur heard.    No friction rub. No gallop.  Pulmonary:     Effort: Pulmonary effort is normal.     Breath sounds: No wheezing or rales.  Abdominal:     General: There is no distension.     Palpations: Abdomen is soft.     Tenderness: There is no abdominal tenderness.  Musculoskeletal:        General: Swelling and tenderness present.     Cervical back: Normal range of motion and neck supple.     Comments: Pain and swelling to the left ankle.  No obvious pain at the  navicular at the base of the fifth metatarsal and foot.  Pulse motor and sensation intact distally.  Skin:    General: Skin is warm and dry.  Neurological:     Mental Status: She is alert and oriented to person, place, and time.  Psychiatric:        Speech: Speech is tangential.        Behavior: Behavior normal.        Thought Content: Thought content is paranoid and delusional.     ED Results / Procedures / Treatments   Labs (all labs ordered are listed, but only abnormal results are displayed) Labs Reviewed - No data to display  EKG None  Radiology DG Ankle Complete Left  Result Date: 03/24/2022 CLINICAL DATA:  Ankle pain common no known injury, initial encounter EXAM: LEFT ANKLE COMPLETE - 3+ VIEW COMPARISON:  10/09/2019 FINDINGS: Generalized soft tissue swelling is noted about the ankle. No acute fracture or dislocation is noted. Tarsal degenerative changes are seen. IMPRESSION: Generalized soft tissue swelling without acute bony abnormality. Electronically Signed   By: Inez Catalina M.D.   On: 03/24/2022 19:21    Procedures Procedures    Medications Ordered in ED Medications  atorvastatin (LIPITOR) tablet 10 mg (has no administration in time range)  mometasone-formoterol (DULERA) 200-5 MCG/ACT inhaler 2 puff (has no administration in time range)  cloZAPine (CLOZARIL) tablet 25 mg (has no administration in time range)  cloZAPine (CLOZARIL) tablet 75 mg (has no administration in time range)  divalproex (DEPAKOTE ER) 24 hr tablet 1,000 mg (has no administration in time range)  melatonin tablet 3 mg (has no administration in time range)  metFORMIN (GLUCOPHAGE) tablet 500 mg (has no administration in time range)  mometasone-formoterol (DULERA) 200-5 MCG/ACT inhaler 2 puff (has no administration in time range)  OLANZapine (ZYPREXA) tablet 10 mg (has no administration in time range)  docusate sodium (COLACE) capsule 100 mg (has no administration in time range)  ibuprofen (ADVIL)  tablet 400 mg (has no administration in time range)  ibuprofen (ADVIL) tablet 800 mg (800 mg Oral Given 03/24/22 1858)    ED Course/ Medical Decision Making/ A&P                           Medical Decision Making Amount and/or Complexity of Data Reviewed Radiology: ordered.  Risk OTC drugs. Prescription drug management.   51 yo F with a chief complaints of being found acting bizarrely.  Patient tells me that she was made unconscious by some medical personnel and they did a procedure on her that turned her into a female.  She also had hurt  her left ankle.  She is a bit disorganized with her speech.  No obvious SI or HI but does seem to be severely debilitated by her thought processes.  We will have her evaluated by TTS.  Feel she is medically clear for TTS evaluation.  Patient was seen by TTS, they know her well and feels she is quite decompensated from her baseline.  Recommended inpatient.  The patients results and plan were reviewed and discussed.   Any x-rays performed were independently reviewed by myself.   Differential diagnosis were considered with the presenting HPI.  Medications  atorvastatin (LIPITOR) tablet 10 mg (has no administration in time range)  mometasone-formoterol (DULERA) 200-5 MCG/ACT inhaler 2 puff (has no administration in time range)  cloZAPine (CLOZARIL) tablet 25 mg (has no administration in time range)  cloZAPine (CLOZARIL) tablet 75 mg (has no administration in time range)  divalproex (DEPAKOTE ER) 24 hr tablet 1,000 mg (has no administration in time range)  melatonin tablet 3 mg (has no administration in time range)  metFORMIN (GLUCOPHAGE) tablet 500 mg (has no administration in time range)  mometasone-formoterol (DULERA) 200-5 MCG/ACT inhaler 2 puff (has no administration in time range)  OLANZapine (ZYPREXA) tablet 10 mg (has no administration in time range)  docusate sodium (COLACE) capsule 100 mg (has no administration in time range)  ibuprofen  (ADVIL) tablet 400 mg (has no administration in time range)  ibuprofen (ADVIL) tablet 800 mg (800 mg Oral Given 03/24/22 1858)    Vitals:   03/24/22 1842 03/24/22 1845  BP: 128/88   Pulse: 88 88  Resp: 16   Temp: 97.6 F (36.4 C)   TempSrc: Oral   SpO2: 100% 100%    Final diagnoses:  Disorganized schizophrenia ([place])    Admission/ observation were discussed with the admitting physician, patient and/or family and they are comfortable with the plan.          Final Clinical Impression(s) / ED Diagnoses Final diagnoses:  Disorganized schizophrenia [facility])    Rx / DC Orders ED Discharge Orders     None         Deno Etienne, DO 03/24/22 1948

## 2022-03-24 NOTE — Consult Note (Signed)
[facility] ED ASSESSMENT   Reason for Consult:  Paranoia Referring Physician:  Deno Etienne, MD  Patient Identification: [name] MRN:  [id number] ED Chief Complaint: Schizoaffective disorder, bipolar type [facility])  Diagnosis:  Principal Problem:   Schizoaffective disorder, bipolar type [facility])   ED Assessment Time Calculation: No data recorded  Subjective:   [name] is a 51 y.o. female patient with a history of schizoaffective disorder-bipolar type who presents to [facility] with EMS after she was found wandering around talking about conspiracies.  Patient is well known to behavioral health services and this provider.   HPI:  On evaluation, the patient is alert and oriented x 4. She is cooperative. Speech is pressure and tangential. It is difficult to follow patient as her speech is pressured and she switches topics quickly. Patient appears to be paranoid that people are trying to convert to become a homosexual. She makes several references to various people that she feels are homosexual trying to convert her and force her to have babies with them. She refers to several people in the community and healthcare that she has known at various times that are homosexuals. Patient also makes several statements related to religion and sex. When asked about her ACT Team, she states that someone that comes to her house told her she was going to "[id number] She then states "But I don't want no dicks and [facility] isn't even open anymore." Patient eventually acknowledges that she is followed by [facility]. She then begins talking about someone having an abortion and a "crazy lady cutting up their thighs." She makes several statements related to insurance and medicaid fraud. When asked about auditory hallucinations, she states, "No, but I saw insects crawling out my ear. But that might be one those biosocial type things."Patient does not appear to be responding to internal stimuli.  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 She then mentions [place] and Obama and then states "[place] is the homosexual [place]. There's a lot of homosexuals in [place] too."   When asked about medications, she states that she only takes [place] every 3 months and trazodone. States she received her last injection on 02/27/22. She later states that she does not take any medications.  She denies suicidal ideations and homicidal ideations.  Attempted to contact [facility] ACT Team for medications, to no avail.   Past Psychiatric History:   Previous Psych Diagnoses: Schizoaffective- BP type Prior inpatient treatment: [facility] 07/2021; 09/27/19; July 2020 [place] Current/prior outpatient treatment: [name],  Prior rehab hx: Denies Psychotherapy hx: Denies History of suicide: Reports suicide attempt in college by cutting wrists History of homicide: Denies Psychiatric medication history: Prev trials- Depakote ER, Risperdal (helped to calm her), Abilify PO and LAI, PO paliperidone while titrating LAI; haldol (allergies), prolixin, invega , zyprexa, seroquel Neuromodulation history: ECT in 1993 Current Psychiatrist: [facility] Current therapist: Denies  Risk to Self or Others: Is the patient at risk to self? Yes Has the patient been a risk to self in the past 6 months? No Has the patient been a risk to self within the distant past? Yes Is the patient a risk to others? No Has the patient been a risk to others in the past 6 months? No Has the patient been a risk to others within the distant past? No  [place] Scale:  [place] [facility] from 03/24/2022 in  [place] DEPT ED from 03/12/2022 in [place] [facility]PT Pre-admit (Canceled) from 12/19/2018 in [place] [facility]  C-SSRS RISK CATEGORY No Risk No Risk No Risk        AIMS:  , , ,  ,   ASAM:    Substance Abuse:     Past Medical History:  Past Medical History:  Diagnosis Date   Anxiety    Asthma    Bipolar 1 disorder ([place])    Depression    Gallstones 08/2020   Hypertension    Insomnia, persistent    Prediabetes    Schizophrenic disorder ([place])    Seizures ([place])     Past Surgical History:  Procedure Laterality Date   BREAST LUMPECTOMY Left 07/2013   TONSILLECTOMY     TUBAL LIGATION  2002   Family History:  Family History  Problem Relation Age of Onset   Depression Mother    Gout Mother    Cancer Father        prostate   Other Father        lung issue   Alcoholism Other    Heart attack Paternal Grandfather    Heart attack Paternal Grandmother    Heart attack Maternal Grandmother    Heart attack Maternal Grandfather    Depression Son    Anxiety disorder Son     Social History:  Social History   Substance and Sexual Activity  Alcohol Use Not Currently     Social History   Substance and Sexual Activity  Drug Use No    Social History   Socioeconomic History   Marital status: Single    Spouse name: Not on file   Number of children: Not on file   Years of education: Not on file   Highest education level: Not on file  Occupational History   Not on file  Tobacco Use   Smoking status: Former    Types: Cigarettes   Smokeless tobacco: Never  Vaping Use   Vaping Use: Never used  Substance and Sexual Activity   Alcohol use: Not Currently   Drug use: No   Sexual activity: Not Currently  Other Topics Concern   Not on file  Social History Narrative   Not on file   Social Determinants of Health   Financial Resource Strain: Not on file  Food Insecurity: Food Insecurity Present (09/16/2020)   Hunger Vital Sign    Worried About Running Out of Food in the Last Year: Sometimes true    Ran Out of Food in the Last Year: Sometimes true  Transportation Needs: No Transportation Needs (09/16/2020)   PRAPARE -  Hydrologist (Medical): No    Lack of Transportation (Non-Medical): No  Physical Activity: Not on file  Stress: Not on file  Social Connections: Not on file   Additional Social History:    Allergies:   Allergies  Allergen Reactions   Haldol [Haloperidol Decanoate] Other (See Comments)    Stiffness, eyes bulging   Penicillins Nausea And Vomiting    Has patient had a PCN reaction causing immediate rash, facial/tongue/throat swelling, SOB or lightheadedness with hypotension:UNSURE  Has patient had a PCN reaction causing severe rash involving mucus membranes or skin necrosis: UNSURE Has patient had a PCN reaction that required hospitalization:UNSURE Has patient had a PCN reaction occurring within the last 10 years:No If all of the above answers are "NO", then may proceed with Cephalosporin use. CHILDHOOD  REACTION   Pollen Extract Other (See Comments)    Seasonal allergies   Shrimp [Shellfish Allergy] Rash    Labs: No results found for this or any previous visit (from the past 48 hour(s)).  Current Facility-Administered Medications  Medication Dose Route Frequency Provider Last Rate Last Admin   atorvastatin (LIPITOR) tablet 10 mg  10 mg Oral Daily Deno Etienne, DO       cloZAPine (CLOZARIL) tablet 25 mg  25 mg Oral Daily Tyrone Nine, Dan, DO       cloZAPine (CLOZARIL) tablet 75 mg  75 mg Oral QHS Deno Etienne, DO       divalproex (DEPAKOTE ER) 24 hr tablet 1,000 mg  1,000 mg Oral QHS Deno Etienne, DO       docusate sodium (COLACE) capsule 100 mg  100 mg Oral Daily Deno Etienne, DO       ibuprofen (ADVIL) tablet 400 mg  400 mg Oral Q6H PRN Deno Etienne, DO       melatonin tablet 3 mg  3 mg Oral QHS Deno Etienne, DO       [START ON 03/25/2022] metFORMIN (GLUCOPHAGE) tablet 500 mg  500 mg Oral Q breakfast Deno Etienne, DO       mometasone-formoterol [facility]) 200-5 MCG/ACT inhaler 2 puff  2 puff Inhalation BID Deno Etienne, DO       mometasone-formoterol (DULERA) 200-5 MCG/ACT  inhaler 2 puff  2 puff Inhalation BID Deno Etienne, DO       OLANZapine (ZYPREXA) tablet 10 mg  10 mg Oral QHS Deno Etienne, DO       Current Outpatient Medications  Medication Sig Dispense Refill   Ascorbic Acid (VITAMIN C WITH ROSE HIPS) 500 MG tablet Take 1,000 mg by mouth daily.     atorvastatin (LIPITOR) 10 MG tablet Take 1 tablet (10 mg total) by mouth daily. 30 tablet 0   budesonide-formoterol (SYMBICORT) 160-4.5 MCG/ACT inhaler SMARTSIG:2 Puff(s) By Mouth Twice Daily     cloZAPine (CLOZARIL) 25 MG tablet Take 1 tablet (25 mg total) by mouth daily. 30 tablet 0   cloZAPine (CLOZARIL) 25 MG tablet Take 3 tablets (75 mg total) by mouth at bedtime. 30 tablet 0   divalproex (DEPAKOTE ER) 500 MG 24 hr tablet Take 2 tablets (1,000 mg total) by mouth at bedtime. 60 tablet 0   docusate sodium (COLACE) 100 MG capsule Take 1 capsule (100 mg total) by mouth daily. 30 capsule 0   hydrOXYzine (VISTARIL) 25 MG capsule Take 1 capsule (25 mg total) by mouth 3 (three) times daily as needed for anxiety. 30 capsule 0   ibuprofen (ADVIL) 200 MG tablet Take 400 mg by mouth every 6 (six) hours as needed for moderate pain or headache.     melatonin 3 MG TABS tablet Take 1 tablet (3 mg total) by mouth at bedtime. 30 tablet 0   metFORMIN (GLUCOPHAGE) 500 MG tablet Take 1 tablet (500 mg total) by mouth daily with breakfast. 30 tablet 0   mometasone-formoterol (DULERA) 200-5 MCG/ACT AERO Inhale 2 puffs into the lungs 2 (two) times daily. 1 each 0   OLANZapine (ZYPREXA) 10 MG tablet Take 10 mg by mouth at bedtime.     paliperidone (INVEGA SUSTENNA) 234 MG/1.5ML SUSY injection Inject 234 mg into the muscle once for 1 dose. (Patient taking differently: Inject 234 mg into the muscle every 21 ( twenty-one) days.) 1.5 mL 1    Musculoskeletal: Strength & Muscle Tone: within normal limits Gait & Station:  not observed  Patient leans: N/A   Psychiatric Specialty Exam: Presentation  General Appearance: Fairly  Groomed  Eye Contact:Fair  Speech:Pressured  Speech Volume:Increased  Handedness:Right   Mood and Affect  Mood:Depressed; Anxious  Affect:Blunt   Thought Process  Thought Processes:Disorganized; Irrevelant  Descriptions of Associations:Tangential  Orientation:Full (Time, Place and Person)  Thought Content:Tangential; Illogical  History of Schizophrenia/Schizoaffective disorder:Yes  Duration of Psychotic Symptoms:Greater than six months  Hallucinations:Hallucinations: None  Ideas of Reference:Delusions  Suicidal Thoughts:Suicidal Thoughts: No  Homicidal Thoughts:Homicidal Thoughts: No   Sensorium  Memory:Immediate Fair; Recent Fair; Remote Fair  Judgment:Impaired  Insight:Poor   Executive Functions  Concentration:Poor  Attention Span:Poor  [place]   Psychomotor Activity  Psychomotor Activity:Psychomotor Activity: Increased   Assets  Assets:Desire for Improvement; Financial Resources/Insurance; Housing; Social Support    Sleep  Sleep:Sleep: Fair   Physical Exam: Physical Exam Constitutional:      General: She is not in acute distress.    Appearance: She is not ill-appearing, toxic-appearing or diaphoretic.  HENT:     Right Ear: External ear normal.     Left Ear: External ear normal.  Eyes:     General:        Right eye: No discharge.        Left eye: No discharge.  Cardiovascular:     Rate and Rhythm: Normal rate.  Pulmonary:     Effort: Pulmonary effort is normal. No respiratory distress.  Musculoskeletal:        General: Normal range of motion.     Cervical back: Normal range of motion.  Neurological:     Mental Status: She is alert and oriented to person, place, and time.  Psychiatric:        Mood and Affect: Mood is anxious and depressed.        Speech: Speech is rapid and pressured and tangential.        Behavior: Behavior is cooperative.        Thought Content: Thought  content is paranoid and delusional. Thought content does not include homicidal or suicidal ideation.    Review of Systems  Psychiatric/Behavioral:  Positive for depression. Negative for hallucinations and suicidal ideas. The patient is nervous/anxious and has insomnia.     Blood pressure 128/88, pulse 88, temperature 97.6 F (36.4 C), temperature source Oral, resp. rate 16, SpO2 100 %. There is no height or weight on file to calculate BMI.  Medical Decision Making: Patient is known well by this provider. Patient has decompensated significantly from her baseline. Recommend inpatient treatment. Sent [facility] secure chat to review for a thought disorder bed.   Home medications resumed by EDP  Medications will need to be verified with [facility].  Problem 1: Schizoaffective disorder, bipolar type    Disposition: Recommend psychiatric Inpatient admission when medically cleared.   Rozetta Nunnery, NP 03/24/2022 7:50 PM

## 2022-03-24 NOTE — ED Triage Notes (Signed)
Pt found walking outside of [facility], c/o left ankle pain, also seems to be somewhat altered. Neuro assessment normal, pt denies taking any unknown medications. Pt arrived with crutches.

## 2022-03-24 NOTE — ED Notes (Signed)
Pt mother is Onika Gudiel  [phone] States pt has been acting erratically and needs psychiatric help

## 2022-03-25 DIAGNOSIS — F201 Disorganized schizophrenia: Secondary | ICD-10-CM | POA: Diagnosis not present

## 2022-03-25 LAB — COMPREHENSIVE METABOLIC PANEL
ALT: 14 U/L (ref 0–44)
AST: 16 U/L (ref 15–41)
Albumin: 3.9 g/dL (ref 3.5–5.0)
Alkaline Phosphatase: 57 U/L (ref 38–126)
Anion gap: 7 (ref 5–15)
BUN: 8 mg/dL (ref 6–20)
CO2: 23 mmol/L (ref 22–32)
Calcium: 9.5 mg/dL (ref 8.9–10.3)
Chloride: 112 mmol/L — ABNORMAL HIGH (ref 98–111)
Creatinine, Ser: 0.69 mg/dL (ref 0.44–1.00)
GFR, Estimated: >60 mL/min (ref >60)
Glucose, Bld: 86 mg/dL (ref 70–99)
Potassium: 3.3 mmol/L — ABNORMAL LOW (ref 3.5–5.1)
Sodium: 142 mmol/L (ref 135–145)
Total Bilirubin: 0.5 mg/dL (ref 0.3–1.2)
Total Protein: 7.8 g/dL (ref 6.5–8.1)

## 2022-03-25 LAB — URINALYSIS, COMPLETE (UACMP) WITH MICROSCOPIC
Bilirubin Urine: NEGATIVE
Glucose, UA: NEGATIVE mg/dL
Hgb urine dipstick: NEGATIVE
Ketones, ur: 5 mg/dL — AB
Leukocytes,Ua: NEGATIVE
Nitrite: NEGATIVE
Protein, ur: 30 mg/dL — AB
Specific Gravity, Urine: 1.026 (ref 1.005–1.030)
pH: 5 (ref 5.0–8.0)

## 2022-03-25 LAB — RAPID URINE DRUG SCREEN, HOSP PERFORMED
Amphetamines: NOT DETECTED
Barbiturates: NOT DETECTED
Benzodiazepines: NOT DETECTED
Cocaine: NOT DETECTED
Opiates: NOT DETECTED
Tetrahydrocannabinol: NOT DETECTED

## 2022-03-25 LAB — VALPROIC ACID LEVEL: Valproic Acid Lvl: 40 ug/mL — ABNORMAL LOW (ref 50.0–100.0)

## 2022-03-25 LAB — ETHANOL: Alcohol, Ethyl (B): <10 mg/dL (ref <10)

## 2022-03-25 LAB — PREGNANCY, URINE: Preg Test, Ur: NEGATIVE

## 2022-03-25 NOTE — Progress Notes (Signed)
Per Sherri [facility] admissions, pt has been accepted to [facility] [place] unit. Accepting provider is Dr. Reece Levy. Patient can arrive 03/27/2022 after 8:00am. Number for report is [phone].   Glennie Isle, MSW, LCSW-A Phone: [phone] Disposition/TOC

## 2022-03-25 NOTE — Progress Notes (Signed)
Per Lindon Romp, NP, patient meets criteria for inpatient treatment. There are no available beds at [facility] today. CSW faxed referrals to the following facilities for review: [facility] [address], [place] [address] [phone] --  [place] N/A [address] [place] [address] [phone] --  [facility]  Pending - Request Sent N/A [address] [place] [address] [phone] --  [address] [place] [address] [phone] --  [place] [address] [place] [address] [phone] --  [place]  Pending - Request Sent N/A [place], [place] [address] [phone] --  [facility] [address] [place], [place] [address] [phone] --  [place] [address] [place] [address] [phone] --  [facility]  Pending - Request Sent N/A [address], [place] [address] [place] N/A [address], [place] [address] [phone] --  [facility]  Pending - Request Sent N/A [address] [place] [address] [phone] --  [facility]  Pending - Request Sent N/A [address] [place] [address] [phone] --  [facility]  Pending - Request Sent N/A [address], [place] [address] [phone] --  [facility]  Pending - Request Sent N/A [place], [place] [address]  [phone] --  [facility]  Pending - Request Sent N/A [address], [place] [address] [phone] --  [facility]  Pending - Request Sent N/A [address] [place] [address] [phone] --  [facility]  Pending - Request Sent N/A [address] [place] [address] [phone] --    TTS will continue to seek bed placement.  Glennie Isle, MSW, [place] Phone: [phone] Disposition/TOC

## 2022-03-25 NOTE — ED Notes (Signed)
Pt glasses located on back of ED stretcher

## 2022-03-25 NOTE — Consult Note (Cosign Needed Addendum)
[facility] ED ASSESSMENT   Reason for Consult:  paranoia Referring Physician:  Deno Etienne, DO Patient Identification: [name] MRN:  [id number] ED Chief Complaint: Schizoaffective disorder, bipolar type [facility])  Diagnosis:  Principal Problem:   Schizoaffective disorder, bipolar type [facility])   ED Assessment Time Calculation: Start Time: 1920 Stop Time: 1955 Total Time in Minutes (Assessment Completion): 35   Subjective:   [name] is a 51 y.o. female patient admitted with paranoia.  Patient has slept throughout the night. On assessment she presents disheveled in appearance, disorganized thought process. Pressured, tangential speech. She is alert and oriented to person, place, time; partial to situation. When asked about current medications pt mentions Invega Trinza injection before changing subjects. Patient remains disorganized and delusional; denies any suicidal or homicidal thoughts, unclear on auditory/visual hallucinations due to current state.   Collateral [name] [phone] ([phone] Patient lives alone. Says mom was in heat all day; concern for possible "heat stroke". Says mom's presentation fluctuates from being very manic to confused. Patient manages own care, is own guardian. Unable to verify medications or active services. States grandmother (patient's mother) has some involvement in care; not available at this time. Will call provider back once grandmother is available.   HPI:  [name] is a 51 year old female with past psychiatric history of schizoaffective disorder bipolar type who presented to [facility] after being found wandering, paranoid and disorganized. Per chart review, pt presented to ED via 03/12/22 for possible fall, 02/10/22 for nausea and vomiting; 01/25/22 [facility]) for psychiatric symptoms where she was discharged to follow up with [name] services, 01/05/22 for psych consult then eloped from the ED. Patient lives alone in [place] and has daughter, son,  mother who offer some support. UDS outstanding, BAL,10. PDMP reviewed, no active prescriptions at this time.   Past Psychiatric History: schizoaffective disorder bipolar type, non-compliance with treatment  Risk to Self or Others: Is the patient at risk to self? Yes Has the patient been a risk to self in the past 6 months? Yes Has the patient been a risk to self within the distant past? Yes Is the patient a risk to others? Yes Has the patient been a risk to others in the past 6 months? Yes Has the patient been a risk to others within the distant past? Yes  [place] Scale:  [place] ED from 03/24/2022 in [place] DEPT ED from 03/12/2022 in [place] DEPT Pre-admit ([place]) from 12/19/2018 in [place] [facility]  C-SSRS RISK CATEGORY No Risk No Risk No Risk       AIMS:  , , ,  ,   ASAM:    Substance Abuse:     Past Medical History:  Past Medical History:  Diagnosis Date  . Anxiety   . Asthma   . Bipolar 1 disorder ([place])   . Depression   . Gallstones 08/2020  . Hypertension   . Insomnia, persistent   . Prediabetes   . Schizophrenic disorder ([place])   . Seizures ([place])     Past Surgical History:  Procedure Laterality Date  . BREAST LUMPECTOMY Left 07/2013  . TONSILLECTOMY    . TUBAL LIGATION  2002   Family History:  Family History  Problem Relation Age of Onset  . Depression Mother   . Gout Mother   . Cancer Father        prostate  . Other Father        lung issue  .  Alcoholism Other   . Heart attack Paternal Grandfather   . Heart attack Paternal Grandmother   . Heart attack Maternal Grandmother   . Heart attack Maternal Grandfather   . Depression Son   . Anxiety disorder Son    Family Psychiatric  History: not noted Social History:  Social History   Substance and Sexual Activity  Alcohol Use Not Currently     Social History   Substance and Sexual Activity   Drug Use No    Social History   Socioeconomic History  . Marital status: Single    Spouse name: Not on file  . Number of children: Not on file  . Years of education: Not on file  . Highest education level: Not on file  Occupational History  . Not on file  Tobacco Use  . Smoking status: Former    Types: Cigarettes  . Smokeless tobacco: Never  Vaping Use  . Vaping Use: Never used  Substance and Sexual Activity  . Alcohol use: Not Currently  . Drug use: No  . Sexual activity: Not Currently  Other Topics Concern  . Not on file  Social History Narrative  . Not on file   Social Determinants of Health   Financial Resource Strain: Not on file  Food Insecurity: Food Insecurity Present (09/16/2020)   Hunger Vital Sign   . Worried About Charity fundraiser in the Last Year: Sometimes true   . Ran Out of Food in the Last Year: Sometimes true  Transportation Needs: No Transportation Needs (09/16/2020)   PRAPARE - Transportation   . Lack of Transportation (Medical): No   . Lack of Transportation (Non-Medical): No  Physical Activity: Not on file  Stress: Not on file  Social Connections: Not on file   Additional Social History:    Allergies:   Allergies  Allergen Reactions  . Haldol [Haloperidol Decanoate] Other (See Comments)    Stiffness, eyes bulging  . Penicillins Nausea And Vomiting    Has patient had a PCN reaction causing immediate rash, facial/tongue/throat swelling, SOB or lightheadedness with hypotension:UNSURE  Has patient had a PCN reaction causing severe rash involving mucus membranes or skin necrosis: UNSURE Has patient had a PCN reaction that required hospitalization:UNSURE Has patient had a PCN reaction occurring within the last 10 years:No If all of the above answers are "NO", then may proceed with Cephalosporin use. CHILDHOOD REACTION  . Pollen Extract Other (See Comments)    Seasonal allergies  . Shrimp [Shellfish Allergy] Rash    Labs:  Results for  orders placed or performed during the hospital encounter of 03/24/22 (from the past 48 hour(s))  CBC with Differential     Status: Abnormal   Collection Time: 03/24/22  8:54 PM  Result Value Ref Range   WBC 6.4 4.0 - 10.5 K/uL   RBC 3.63 (L) 3.87 - 5.11 MIL/uL   Hemoglobin 10.5 (L) 12.0 - 15.0 g/dL   HCT 32.5 (L) 36.0 - 46.0 %   MCV 89.5 80.0 - 100.0 fL   MCH 28.9 26.0 - 34.0 pg   MCHC 32.3 30.0 - 36.0 g/dL   RDW 14.6 11.5 - 15.5 %   Platelets 228 150 - 400 K/uL   nRBC 0.0 0.0 - 0.2 %   Neutrophils Relative % 56 %   Neutro Abs 3.6 1.7 - 7.7 K/uL   Lymphocytes Relative 33 %   Lymphs Abs 2.1 0.7 - 4.0 K/uL   Monocytes Relative 7 %   Monocytes Absolute 0.5  0.1 - 1.0 K/uL   Eosinophils Relative 4 %   Eosinophils Absolute 0.3 0.0 - 0.5 K/uL   Basophils Relative 0 %   Basophils Absolute 0.0 0.0 - 0.1 K/uL   Immature Granulocytes 0 %   Abs Immature Granulocytes 0.01 0.00 - 0.07 K/uL    Comment: Performed at [facility], [place] [address] [place], [place] [address]    Current Facility-Administered Medications  Medication Dose Route Frequency Provider Last Rate Last Admin  . atorvastatin (LIPITOR) tablet 10 mg  10 mg Oral Daily Deno Etienne, DO   10 mg at 03/25/22 0953  . cloZAPine (CLOZARIL) tablet 25 mg  25 mg Oral Daily Deno Etienne, DO      . cloZAPine (CLOZARIL) tablet 75 mg  75 mg Oral QHS Deno Etienne, DO   75 mg at 03/24/22 2349  . divalproex (DEPAKOTE ER) 24 hr tablet 1,000 mg  1,000 mg Oral QHS Deno Etienne, DO   1,000 mg at 03/24/22 2212  . docusate sodium (COLACE) capsule 100 mg  100 mg Oral Daily Deno Etienne, DO   100 mg at 03/25/22 0953  . ibuprofen (ADVIL) tablet 400 mg  400 mg Oral Q6H PRN Deno Etienne, DO      . melatonin tablet 3 mg  3 mg Oral QHS Deno Etienne, DO   3 mg at 03/24/22 2212  . metFORMIN (GLUCOPHAGE) tablet 500 mg  500 mg Oral Q breakfast Deno Etienne, DO   500 mg at 03/25/22 0953  . mometasone-formoterol (DULERA) 200-5 MCG/ACT inhaler 2 puff  2 puff  Inhalation BID Deno Etienne, DO   2 puff at 03/25/22 0953  . OLANZapine (ZYPREXA) tablet 10 mg  10 mg Oral QHS Deno Etienne, DO   10 mg at 03/24/22 2212   Current Outpatient Medications  Medication Sig Dispense Refill  . Ascorbic Acid (VITAMIN C WITH ROSE HIPS) 500 MG tablet Take 1,000 mg by mouth daily.    Marland Kitchen atorvastatin (LIPITOR) 10 MG tablet Take 1 tablet (10 mg total) by mouth daily. 30 tablet 0  . budesonide-formoterol (SYMBICORT) 160-4.5 MCG/ACT inhaler SMARTSIG:2 Puff(s) By Mouth Twice Daily    . cloZAPine (CLOZARIL) 25 MG tablet Take 1 tablet (25 mg total) by mouth daily. 30 tablet 0  . cloZAPine (CLOZARIL) 25 MG tablet Take 3 tablets (75 mg total) by mouth at bedtime. 30 tablet 0  . divalproex (DEPAKOTE ER) 500 MG 24 hr tablet Take 2 tablets (1,000 mg total) by mouth at bedtime. 60 tablet 0  . docusate sodium (COLACE) 100 MG capsule Take 1 capsule (100 mg total) by mouth daily. 30 capsule 0  . hydrOXYzine (VISTARIL) 25 MG capsule Take 1 capsule (25 mg total) by mouth 3 (three) times daily as needed for anxiety. 30 capsule 0  . ibuprofen (ADVIL) 200 MG tablet Take 400 mg by mouth every 6 (six) hours as needed for moderate pain or headache.    . melatonin 3 MG TABS tablet Take 1 tablet (3 mg total) by mouth at bedtime. 30 tablet 0  . metFORMIN (GLUCOPHAGE) 500 MG tablet Take 1 tablet (500 mg total) by mouth daily with breakfast. 30 tablet 0  . mometasone-formoterol (DULERA) 200-5 MCG/ACT AERO Inhale 2 puffs into the lungs 2 (two) times daily. 1 each 0  . OLANZapine (ZYPREXA) 10 MG tablet Take 10 mg by mouth at bedtime.    . paliperidone (INVEGA SUSTENNA) 234 MG/1.5ML SUSY injection Inject 234 mg into the muscle once for 1 dose. (Patient taking  differently: Inject 234 mg into the muscle every 21 ( twenty-one) days.) 1.5 mL 1    Musculoskeletal: Strength & Muscle Tone: within normal limits Gait & Station: normal Patient leans: N/A   Psychiatric Specialty Exam: Presentation  General  Appearance: Fairly Groomed  Eye Contact:Fair  Speech:Pressured  Speech Volume:Increased  Handedness:Right   Mood and Affect  Mood:Depressed; Anxious  Affect:Blunt   Thought Process  Thought Processes:Disorganized; Irrevelant  Descriptions of Associations:Tangential  Orientation:Full (Time, Place and Person)  Thought Content:Tangential; Illogical  History of Schizophrenia/Schizoaffective disorder:Yes  Duration of Psychotic Symptoms:Greater than six months  Hallucinations:Hallucinations: None  Ideas of Reference:Delusions  Suicidal Thoughts:Suicidal Thoughts: No  Homicidal Thoughts:Homicidal Thoughts: No   Sensorium  Memory:Immediate Fair; Recent Fair; Remote Fair  Judgment:Impaired  Insight:Poor   Executive Functions  Concentration:Poor  Attention Span:Poor  Powers   Psychomotor Activity  Psychomotor Activity:Psychomotor Activity: Increased   Assets  Assets:Desire for Improvement; Financial Resources/Insurance; Housing; Social Support  Sleep  Sleep:Sleep: Fair  Physical Exam: Physical Exam Vitals and nursing note reviewed.  Constitutional:      Appearance: She is normal weight.  HENT:     Head: Normocephalic.     Nose: Nose normal.     Mouth/Throat:     Mouth: Mucous membranes are moist.     Pharynx: Oropharynx is clear.  Eyes:     Pupils: Pupils are equal, round, and reactive to light.  Cardiovascular:     Rate and Rhythm: Normal rate.     Pulses: Normal pulses.  Pulmonary:     Effort: Pulmonary effort is normal.  Musculoskeletal:        General: Normal range of motion.     Cervical back: Normal range of motion.  Skin:    General: Skin is warm.  Neurological:     Mental Status: She is alert and oriented to person, place, and time.  Psychiatric:        Attention and Perception: She is inattentive.        Mood and Affect: Affect is inappropriate.        Speech: Speech is  tangential.        Behavior: Behavior is withdrawn.        Thought Content: Thought content is paranoid and delusional. Thought content does not include homicidal or suicidal ideation. Thought content does not include homicidal or suicidal plan.        Cognition and Memory: Cognition is impaired.        Judgment: Judgment is inappropriate.   Review of Systems  Psychiatric/Behavioral:  Negative for substance abuse.   All other systems reviewed and are negative.  Blood pressure (!) 142/95, pulse 77, temperature 97.7 F (36.5 C), temperature source Oral, resp. rate 16, height '5\' 6"'$  (1.676 m), weight 114.8 kg, SpO2 96 %. Body mass index is 40.84 kg/m.  Medical Decision Making: Recommended for inpatient psychiatric hospitalization. SW currently seeking placement. Home medications resumed.   Problem 1: Schizoaffective disorder bipolar type: Restart home medications: Valproic Acid 1000 mg HS- mood symptoms, Olanzapine 10 mg HS- psychotic symptoms Invega Trinza 819 mg IM given 03/21/22.   Problem 2: Non-compliance with Treatment  Disposition: Recommend psychiatric Inpatient admission when medically cleared. Supportive therapy provided about ongoing stressors. Discussed crisis plan, support from social network, calling 911, coming to the Emergency Department, and calling Suicide Hotline.  Inda Merlin, NP 03/25/2022 9:54 AM

## 2022-03-25 NOTE — ED Notes (Signed)
Pt changed into purple scrubs. Pt belongings placed in nurse's station cabinet 16-18 and Resus A. Pt belongings include one hospital bag and one pink book bag.

## 2022-03-25 NOTE — Progress Notes (Signed)
Lattie Haw  with [facility] contacted CSW in reference to requesting additional information for this patient to be further reviewed. A UA, pregnancy test result, Urine Drug screen, ETOH, CMP, and a BPA/ Depakote level was requested for further review for placement.   Glennie Isle, MSW, [facility] Phone: [phone] Disposition/TOC

## 2022-03-25 NOTE — Progress Notes (Signed)
PHARMACIST - PHYSICIAN ORDER COMMUNICATION  [name] is a 51 y.o. year old female with a history of schizoaffective disorder-bipolar type on Clozapine PTA. Continuing this medication order as an inpatient requires that monitoring parameters per REMS requirements must be met. Patient enrolled as of 08/18/2021.  Clozapine REMS Dispense Authorization was obtained, and will dispense inpatient.  RDA code [id number].  Verified Clozapine dose: 25 mg po qam and 75 mg po qpm   Last ANC value and date reported on the Clozapine REMS website:  08/23/2021 3500 03/24/2022 3600  ANC monitoring frequency:  Weekly from initiation to 6 months Every 2 weeks from 6-12 months Monthly after 12 months Next ANC reporting is due on (date) 04/07/2022  Thank you for allowing pharmacy to be a part of this patient's care. Royetta Asal, PharmD, BCPS Clinical Pharmacist [place] Please utilize Amion for appropriate phone number to reach the unit pharmacist ([place]) 03/25/2022 10:36 AM

## 2022-03-25 NOTE — ED Notes (Signed)
Per [facility] admissions, pt has been accepted to [facility] [place] unit. Accepting provider is Dr. Reece Levy. Patient can arrive 03/27/2022 after 8:00am. Number for report is [phone].  Will continue to look for placement for today and tomorrow but if nothing is available patient has guranteed bed for Monday July 31st after 8am per SW

## 2022-03-26 DIAGNOSIS — F201 Disorganized schizophrenia: Secondary | ICD-10-CM | POA: Diagnosis not present

## 2022-03-26 NOTE — Consult Note (Cosign Needed Addendum)
[facility] Psych ED Progress Note  03/26/2022 10:05 AM Donald Jacque  MRN:  [id number]   Method of visit?: Face to Face   Subjective:   Patient presents laying in bed; casual, disheveled in appearance. Alert, oriented to person, place, and time; unclear on situation. Endorses feeling "well". She insists not being on Clozaril and states she only takes medication "on an as needed basis"; denies any daily medication intake despite [name] Team bringing medication. Most recent Depakote level 40. Patient lives alone; mother, daughter distantly involved in care. Patient is her own guardian. Receives [name] services via [place]; unable to reach despite several attempts.    On assessment patient remains disorganized and tangential; loose, pressured, speech. She denies any suicidal or homicidal ideations. Endorses some auditory hallucinations; nursing staff report patient responding to external/internal stimuli. Home medications restarted; compliant without any noted issues.   Principal Problem: Schizoaffective disorder, bipolar type ([place]) Diagnosis:  Principal Problem:   Schizoaffective disorder, bipolar type ([place]) Active Problems:   Noncompliance with treatment  Total Time spent with patient: 20 minutes  Past Psychiatric History: schizoaffective disorder bipolar type, non-compliance with treatment  Past Medical History:  Past Medical History:  Diagnosis Date   Anxiety    Asthma    Bipolar 1 disorder ([place])    Depression    Gallstones 08/2020   Hypertension    Insomnia, persistent    Prediabetes    Schizophrenic disorder (HCC)    Seizures ([place])     Past Surgical History:  Procedure Laterality Date   BREAST LUMPECTOMY Left 07/2013   TONSILLECTOMY     TUBAL LIGATION  2002   Family History:  Family History  Problem Relation Age of Onset   Depression Mother    Gout Mother    Cancer Father        prostate   Other Father        lung issue   Alcoholism Other    Heart attack Paternal  Grandfather    Heart attack Paternal Grandmother    Heart attack Maternal Grandmother    Heart attack Maternal Grandfather    Depression Son    Anxiety disorder Son    Family Psychiatric  History: not noted Social History:  Social History   Substance and Sexual Activity  Alcohol Use Not Currently     Social History   Substance and Sexual Activity  Drug Use No    Social History   Socioeconomic History   Marital status: Single    Spouse name: Not on file   Number of children: Not on file   Years of education: Not on file   Highest education level: Not on file  Occupational History   Not on file  Tobacco Use   Smoking status: Former    Types: Cigarettes   Smokeless tobacco: Never  Vaping Use   Vaping Use: Never used  Substance and Sexual Activity   Alcohol use: Not Currently   Drug use: No   Sexual activity: Not Currently  Other Topics Concern   Not on file  Social History Narrative   Not on file   Social Determinants of Health   Financial Resource Strain: Not on file  Food Insecurity: Food Insecurity Present (09/16/2020)   Hunger Vital Sign    Worried About Running Out of Food in the Last Year: Sometimes true    Ran Out of Food in the Last Year: Sometimes true  Transportation Needs: No Transportation Needs (09/16/2020)   PRAPARE - Transportation  Lack of Transportation (Medical): No    Lack of Transportation (Non-Medical): No  Physical Activity: Not on file  Stress: Not on file  Social Connections: Not on file    Sleep: Fair  Appetite:  Fair  Current Medications: Current Facility-Administered Medications  Medication Dose Route Frequency Provider Last Rate Last Admin   atorvastatin (LIPITOR) tablet 10 mg  10 mg Oral Daily Deno Etienne, DO   10 mg at 03/26/22 0906   divalproex (DEPAKOTE ER) 24 hr tablet 1,000 mg  1,000 mg Oral QHS Deno Etienne, DO   1,000 mg at 03/25/22 2154   docusate sodium (COLACE) capsule 100 mg  100 mg Oral Daily Deno Etienne, DO   100  mg at 03/26/22 6440   ibuprofen (ADVIL) tablet 400 mg  400 mg Oral Q6H PRN Deno Etienne, DO       melatonin tablet 3 mg  3 mg Oral QHS Deno Etienne, DO   3 mg at 03/25/22 2154   metFORMIN (GLUCOPHAGE) tablet 500 mg  500 mg Oral Q breakfast Deno Etienne, DO   500 mg at 03/26/22 3474   mometasone-formoterol (DULERA) 200-5 MCG/ACT inhaler 2 puff  2 puff Inhalation BID Deno Etienne, DO   2 puff at 03/26/22 0906   OLANZapine (ZYPREXA) tablet 10 mg  10 mg Oral QHS Deno Etienne, DO   10 mg at 03/25/22 2155   Current Outpatient Medications  Medication Sig Dispense Refill   albuterol (VENTOLIN HFA) 108 (90 Base) MCG/ACT inhaler Inhale 2 puffs into the lungs every 6 (six) hours as needed for wheezing or shortness of breath.     atorvastatin (LIPITOR) 10 MG tablet Take 1 tablet (10 mg total) by mouth daily. (Patient taking differently: Take 10 mg by mouth at bedtime.) 30 tablet 0   budesonide-formoterol (SYMBICORT) 160-4.5 MCG/ACT inhaler Inhale 2 puffs into the lungs 2 (two) times daily.     divalproex (DEPAKOTE ER) 500 MG 24 hr tablet Take 2 tablets (1,000 mg total) by mouth at bedtime. 60 tablet 0   hydrOXYzine (VISTARIL) 25 MG capsule Take 1 capsule (25 mg total) by mouth 3 (three) times daily as needed for anxiety. 30 capsule 0   OLANZapine (ZYPREXA) 10 MG tablet Take 10 mg by mouth at bedtime.     paliperidone Palmitate ER (INVEGA TRINZA) 819 MG/2.63ML injection Inject 819 mg into the muscle every 3 (three) months. +/- 14 days of due date     Ascorbic Acid (VITAMIN C WITH ROSE HIPS) 500 MG tablet Take 1,000 mg by mouth daily.     cloZAPine (CLOZARIL) 25 MG tablet Take 1 tablet (25 mg total) by mouth daily. (Patient not taking: Reported on 03/25/2022) 30 tablet 0   cloZAPine (CLOZARIL) 25 MG tablet Take 3 tablets (75 mg total) by mouth at bedtime. (Patient not taking: Reported on 03/25/2022) 30 tablet 0   docusate sodium (COLACE) 100 MG capsule Take 1 capsule (100 mg total) by mouth daily. 30 capsule 0   ibuprofen  (ADVIL) 200 MG tablet Take 400 mg by mouth every 6 (six) hours as needed for moderate pain or headache.     melatonin 3 MG TABS tablet Take 1 tablet (3 mg total) by mouth at bedtime. (Patient not taking: Reported on 03/25/2022) 30 tablet 0   metFORMIN (GLUCOPHAGE) 500 MG tablet Take 1 tablet (500 mg total) by mouth daily with breakfast. (Patient not taking: Reported on 03/25/2022) 30 tablet 0   mometasone-formoterol (DULERA) 200-5 MCG/ACT AERO Inhale 2 puffs into the lungs  2 (two) times daily. (Patient not taking: Reported on 03/25/2022) 1 each 0    Lab Results:  Results for orders placed or performed during the hospital encounter of 03/24/22 (from the past 48 hour(s))  CBC with Differential     Status: Abnormal   Collection Time: 03/24/22  8:54 PM  Result Value Ref Range   WBC 6.4 4.0 - 10.5 K/uL   RBC 3.63 (L) 3.87 - 5.11 MIL/uL   Hemoglobin 10.5 (L) 12.0 - 15.0 g/dL   HCT 32.5 (L) 36.0 - 46.0 %   MCV 89.5 80.0 - 100.0 fL   MCH 28.9 26.0 - 34.0 pg   MCHC 32.3 30.0 - 36.0 g/dL   RDW 14.6 11.5 - 15.5 %   Platelets 228 150 - 400 K/uL   nRBC 0.0 0.0 - 0.2 %   Neutrophils Relative % 56 %   Neutro Abs 3.6 1.7 - 7.7 K/uL   Lymphocytes Relative 33 %   Lymphs Abs 2.1 0.7 - 4.0 K/uL   Monocytes Relative 7 %   Monocytes Absolute 0.5 0.1 - 1.0 K/uL   Eosinophils Relative 4 %   Eosinophils Absolute 0.3 0.0 - 0.5 K/uL   Basophils Relative 0 %   Basophils Absolute 0.0 0.0 - 0.1 K/uL   Immature Granulocytes 0 %   Abs Immature Granulocytes 0.01 0.00 - 0.07 K/uL    Comment: Performed at [facility], [place] [address] [place], [place] [address]  Rapid urine drug screen (hospital performed)     Status: None   Collection Time: 03/25/22  8:48 AM  Result Value Ref Range   Opiates NONE DETECTED NONE DETECTED   Cocaine NONE DETECTED NONE DETECTED   Benzodiazepines NONE DETECTED NONE DETECTED   Amphetamines NONE DETECTED NONE DETECTED   Tetrahydrocannabinol NONE DETECTED NONE DETECTED    Barbiturates NONE DETECTED NONE DETECTED    Comment: (NOTE) DRUG SCREEN FOR MEDICAL PURPOSES ONLY.  IF CONFIRMATION IS NEEDED FOR ANY PURPOSE, NOTIFY LAB WITHIN 5 DAYS.  LOWEST DETECTABLE LIMITS FOR URINE DRUG SCREEN Drug Class                     Cutoff (ng/mL) Amphetamine and metabolites    1000 Barbiturate and metabolites    200 Benzodiazepine                 616 Tricyclics and metabolites     300 Opiates and metabolites        300 Cocaine and metabolites        300 THC                            50 Performed at [facility], [place] [address], [place], [place] [address]   Urinalysis, Complete w Microscopic Urine, Clean Catch     Status: Abnormal   Collection Time: 03/25/22 10:43 AM  Result Value Ref Range   Color, Urine YELLOW YELLOW   APPearance HAZY (A) CLEAR   Specific Gravity, Urine 1.026 1.005 - 1.030   pH 5.0 5.0 - 8.0   Glucose, UA NEGATIVE NEGATIVE mg/dL   Hgb urine dipstick NEGATIVE NEGATIVE   Bilirubin Urine NEGATIVE NEGATIVE   Ketones, ur 5 (A) NEGATIVE mg/dL   Protein, ur 30 (A) NEGATIVE mg/dL   Nitrite NEGATIVE NEGATIVE   Leukocytes,Ua NEGATIVE NEGATIVE   RBC / HPF 0-5 0 - 5 RBC/hpf   WBC, UA 0-5 0 - 5 WBC/hpf  Bacteria, UA FEW (A) NONE SEEN   Squamous Epithelial / LPF 0-5 0 - 5   Mucus PRESENT     Comment: Performed at [facility], [place] [address], [place], [place] [address]  Pregnancy, urine     Status: None   Collection Time: 03/25/22 10:43 AM  Result Value Ref Range   Preg Test, Ur NEGATIVE NEGATIVE    Comment:        THE SENSITIVITY OF THIS METHODOLOGY IS >20 mIU/mL. Performed at [facility], [place] [address], [place], [place] [address]   Valproic acid level     Status: Abnormal   Collection Time: 03/25/22 11:29 AM  Result Value Ref Range   Valproic Acid Lvl 40 (L) 50.0 - 100.0 ug/mL    Comment: Performed at [facility], [place] [address], [place], [place] [address]   Comprehensive metabolic panel     Status: Abnormal   Collection Time: 03/25/22 11:29 AM  Result Value Ref Range   Sodium 142 135 - 145 mmol/L   Potassium 3.3 (L) 3.5 - 5.1 mmol/L   Chloride 112 (H) 98 - 111 mmol/L   CO2 23 22 - 32 mmol/L   Glucose, Bld 86 70 - 99 mg/dL    Comment: Glucose reference range applies only to samples taken after fasting for at least 8 hours.   BUN 8 6 - 20 mg/dL   Creatinine, Ser 0.69 0.44 - 1.00 mg/dL   Calcium 9.5 8.9 - 10.3 mg/dL   Total Protein 7.8 6.5 - 8.1 g/dL   Albumin 3.9 3.5 - 5.0 g/dL   AST 16 15 - 41 U/L   ALT 14 0 - 44 U/L   Alkaline Phosphatase 57 38 - 126 U/L   Total Bilirubin 0.5 0.3 - 1.2 mg/dL   GFR, Estimated >60 >60 mL/min    Comment: (NOTE) Calculated using the CKD-EPI Creatinine Equation (2021)    Anion gap 7 5 - 15    Comment: Performed at [facility], [place] [address] [place], [place] [address]  Ethanol     Status: None   Collection Time: 03/25/22 11:29 AM  Result Value Ref Range   Alcohol, Ethyl (B) <10 <10 mg/dL    Comment: (NOTE) Lowest detectable limit for serum alcohol is 10 mg/dL.  For medical purposes only. Performed at [facility], [place] [address] [place], [place] [address]     Blood Alcohol level:  Lab Results  Component Value Date   ETH <10 03/25/2022   ETH <10 01/05/2022    Physical Findings: AIMS:  , ,  ,  ,    CIWA:    COWS:     Musculoskeletal: Strength & Muscle Tone: within normal limits Gait & Station: normal Patient leans: N/A  Psychiatric Specialty Exam:  Presentation  General Appearance: Disheveled  Eye Contact:Fair  Speech:Pressured  Speech Volume:Normal  Handedness:Right  Mood and Affect  Mood:Dysphoric  Affect:Blunt; Inappropriate  Thought Process  Thought Processes:Disorganized  Descriptions of Associations:Tangential  Orientation:Full (Time, Place and Person)  Thought Content:Illogical; Tangential; Scattered  History of  Schizophrenia/Schizoaffective disorder:Yes  Duration of Psychotic Symptoms:Greater than six months  Hallucinations:Hallucinations: None  Ideas of Reference:Delusions; Paranoia; Percusatory  Suicidal Thoughts:Suicidal Thoughts: No  Homicidal Thoughts:Homicidal Thoughts: No  Sensorium  Memory:Immediate Fair; Recent Fair; Remote Fair  Judgment:Impaired  Insight:Poor  Executive Functions  Concentration:Poor  Attention Span:Poor  Recall:Poor  Fund of Knowledge:Fair  Language:Fair  Psychomotor Activity  Psychomotor Activity:Psychomotor Activity: Normal  Assets  Assets:Desire for Improvement; Financial Resources/Insurance; Housing; Physical Health; Resilience  Sleep  Sleep:Sleep: Fair    Physical Exam: Physical Exam Vitals and nursing note reviewed.  Constitutional:      Appearance: She is obese. She is not ill-appearing, toxic-appearing or diaphoretic.  HENT:     Head: Normocephalic.     Nose: Nose normal.     Mouth/Throat:     Mouth: Mucous membranes are moist.     Pharynx: Oropharynx is clear.  Eyes:     Pupils: Pupils are equal, round, and reactive to light.  Cardiovascular:     Rate and Rhythm: Normal rate.     Pulses: Normal pulses.  Pulmonary:     Effort: Pulmonary effort is normal.  Abdominal:     Palpations: Abdomen is soft.  Musculoskeletal:        General: Normal range of motion.     Cervical back: Normal range of motion.  Skin:    General: Skin is warm and dry.  Neurological:     Mental Status: She is alert and oriented to person, place, and time.  Psychiatric:        Attention and Perception: She is inattentive. She perceives auditory hallucinations.        Mood and Affect: Affect is inappropriate.        Speech: Speech is rapid and pressured and tangential.        Behavior: Behavior is withdrawn. Behavior is cooperative.        Thought Content: Thought content is paranoid and delusional. Thought content does not include homicidal or  suicidal ideation. Thought content does not include homicidal or suicidal plan.        Cognition and Memory: Cognition is impaired.        Judgment: Judgment is inappropriate.   Review of Systems  Psychiatric/Behavioral:  Positive for hallucinations.   All other systems reviewed and are negative.  Blood pressure 112/76, pulse 78, temperature (!) 97.4 F (36.3 C), temperature source Oral, resp. rate 18, height '5\' 6"'$  (1.676 m), weight 114.8 kg, SpO2 100 %. Body mass index is 40.84 kg/m.  Treatment Plan Summary: Daily contact with patient to assess and evaluate symptoms and progress in treatment, Medication management, and Plan inpatient psychiatric hospitalization. Patient accepted to [facility] for 03/27/22 admission.   Inda Merlin, NP 03/26/2022, 10:05 AM

## 2022-03-26 NOTE — ED Notes (Signed)
[name] remains asleep no distress or sleep disturbance noted respirations appear easy and skin color appropriate for her ethnicity.

## 2022-03-26 NOTE — ED Notes (Signed)
Pt resting quietly with eyes closed at this time.  Will defer VS until pt awake and interactive.

## 2022-03-26 NOTE — ED Provider Notes (Signed)
Emergency Medicine Observation Re-evaluation Note  [name] is a 51 y.o. female, seen on rounds today.  Pt initially presented to the ED for complaints of Altered Mental Status and Ankle Pain Currently, the patient is resting  Physical Exam  BP 124/82 (BP Location: Right Arm)   Pulse 82   Temp 99.3 F (37.4 C) (Oral)   Resp 20   Ht '5\' 6"'$  (1.676 m)   Wt 114.8 kg   SpO2 99%   BMI 40.84 kg/m  Physical Exam General: No apparent distress Cardiac: Regular heart rate Lungs: No respiratory distress Psych: Stable  ED Course / MDM  EKG:   I have reviewed the labs performed to date as well as medications administered while in observation.    Plan  Current plan is for inpatient psychiatric placement  [name] is not under involuntary commitment.     Wyvonnia Dusky, MD 03/26/22 1520

## 2022-03-26 NOTE — ED Notes (Signed)
Sleeping, no distressed or disturbed sleep patterns noted

## 2022-03-26 NOTE — ED Notes (Signed)
Pt observed talking to herself.

## 2022-03-27 DIAGNOSIS — F201 Disorganized schizophrenia: Secondary | ICD-10-CM | POA: Diagnosis not present

## 2022-03-27 LAB — CBG MONITORING, ED: Glucose-Capillary: 74 mg/dL (ref 70–99)

## 2022-03-27 NOTE — ED Notes (Signed)
Pt got her breakfast tray.

## 2022-03-27 NOTE — ED Notes (Signed)
Patient off unit to facility per provider. Patient alert, cooperative, no s/s of distress. DC information and belongings given to [employer] for transportation. Patient ambulatory. Patient off unit in w/c. Patient transported by [employer].

## 2022-03-27 NOTE — ED Notes (Signed)
Pt got her lunch tray.  

## 2022-03-27 NOTE — ED Provider Notes (Signed)
Emergency Medicine Observation Re-evaluation Note  [name] is a 51 y.o. female, seen on rounds today.  Pt initially presented to the ED for complaints of Altered Mental Status and Ankle Pain Currently, the patient is resting comfortably.  Physical Exam  BP 117/86 (BP Location: Right Arm)   Pulse 76   Temp 97.8 F (36.6 C) (Oral)   Resp 19   Ht '5\' 6"'$  (1.676 m)   Wt 114.8 kg   SpO2 96%   BMI 40.84 kg/m  Physical Exam General: Nontoxic appearing Cardiac: Normal heart rate Lungs: Normal respiratory rate Psych: No internal responsiveness  ED Course / MDM  EKG:   I have reviewed the labs performed to date as well as medications administered while in observation.  Recent changes in the last 24 hours include remaining cooperative.  Plan  Current plan is for transfer to the psychiatric facility, [facility], later today, admitting doctor Reece Levy.  Maliha Outten is not under involuntary commitment.     Daleen Bo, MD 03/27/22 1500

## 2022-04-25 ENCOUNTER — Emergency Department (EMERGENCY_DEPARTMENT_HOSPITAL)
Admission: EM | Admit: 2022-04-25 | Discharge: 2022-04-26 | Disposition: A | Discharge status: Other Acute Inpatient Hospital | Payer: Medicare Other | Source: Home / Self Care | Attending: Emergency Medicine | Admitting: Emergency Medicine

## 2022-04-25 ENCOUNTER — Encounter (HOSPITAL_COMMUNITY): Payer: Self-pay

## 2022-04-25 ENCOUNTER — Other Ambulatory Visit: Payer: Self-pay

## 2022-04-25 DIAGNOSIS — F250 Schizoaffective disorder, bipolar type: Secondary | ICD-10-CM | POA: Diagnosis present

## 2022-04-25 DIAGNOSIS — Z79899 Other long term (current) drug therapy: Secondary | ICD-10-CM | POA: Insufficient documentation

## 2022-04-25 DIAGNOSIS — R45851 Suicidal ideations: Secondary | ICD-10-CM

## 2022-04-25 DIAGNOSIS — F419 Anxiety disorder, unspecified: Secondary | ICD-10-CM | POA: Insufficient documentation

## 2022-04-25 DIAGNOSIS — Z20822 Contact with and (suspected) exposure to covid-19: Secondary | ICD-10-CM | POA: Insufficient documentation

## 2022-04-25 DIAGNOSIS — F259 Schizoaffective disorder, unspecified: Secondary | ICD-10-CM | POA: Diagnosis present

## 2022-04-25 DIAGNOSIS — F29 Unspecified psychosis not due to a substance or known physiological condition: Secondary | ICD-10-CM | POA: Insufficient documentation

## 2022-04-25 LAB — COMPREHENSIVE METABOLIC PANEL
ALT: 15 U/L (ref 0–44)
AST: 17 U/L (ref 15–41)
Albumin: 4.1 g/dL (ref 3.5–5.0)
Alkaline Phosphatase: 63 U/L (ref 38–126)
Anion gap: 8 (ref 5–15)
BUN: 10 mg/dL (ref 6–20)
CO2: 23 mmol/L (ref 22–32)
Calcium: 9.3 mg/dL (ref 8.9–10.3)
Chloride: 108 mmol/L (ref 98–111)
Creatinine, Ser: 0.72 mg/dL (ref 0.44–1.00)
GFR, Estimated: >60 mL/min (ref >60)
Glucose, Bld: 103 mg/dL — ABNORMAL HIGH (ref 70–99)
Potassium: 3.5 mmol/L (ref 3.5–5.1)
Sodium: 139 mmol/L (ref 135–145)
Total Bilirubin: 0.6 mg/dL (ref 0.3–1.2)
Total Protein: 7.6 g/dL (ref 6.5–8.1)

## 2022-04-25 LAB — CBC
HCT: 35.3 % — ABNORMAL LOW (ref 36.0–46.0)
Hemoglobin: 11.2 g/dL — ABNORMAL LOW (ref 12.0–15.0)
MCH: 28.6 pg (ref 26.0–34.0)
MCHC: 31.7 g/dL (ref 30.0–36.0)
MCV: 90.3 fL (ref 80.0–100.0)
Platelets: 234 10*3/uL (ref 150–400)
RBC: 3.91 MIL/uL (ref 3.87–5.11)
RDW: 14.2 % (ref 11.5–15.5)
WBC: 5.3 10*3/uL (ref 4.0–10.5)
nRBC: 0 % (ref 0.0–0.2)

## 2022-04-25 LAB — ETHANOL: Alcohol, Ethyl (B): <10 mg/dL (ref <10)

## 2022-04-25 LAB — RAPID URINE DRUG SCREEN, HOSP PERFORMED
Amphetamines: NOT DETECTED
Barbiturates: NOT DETECTED
Benzodiazepines: NOT DETECTED
Cocaine: NOT DETECTED
Opiates: NOT DETECTED
Tetrahydrocannabinol: NOT DETECTED

## 2022-04-25 LAB — SALICYLATE LEVEL: Salicylate Lvl: <7 mg/dL — ABNORMAL LOW (ref 7.0–30.0)

## 2022-04-25 LAB — RESP PANEL BY RT-PCR (FLU A&B, COVID) ARPGX2
Influenza A by PCR: NEGATIVE
Influenza B by PCR: NEGATIVE
SARS Coronavirus 2 by RT PCR: NEGATIVE

## 2022-04-25 LAB — I-STAT BETA HCG BLOOD, ED (MC, WL, AP ONLY): I-stat hCG, quantitative: <5 m[IU]/mL (ref <5)

## 2022-04-25 LAB — ACETAMINOPHEN LEVEL: Acetaminophen (Tylenol), Serum: <10 ug/mL — ABNORMAL LOW (ref 10–30)

## 2022-04-25 MED ORDER — ALUM & MAG HYDROXIDE-SIMETH 200-200-20 MG/5ML PO SUSP: 30.0000 mL | ORAL | Status: DC | PRN

## 2022-04-25 MED ORDER — ACETAMINOPHEN 325 MG PO TABS: 650.0000 mg | ORAL_TABLET | Freq: Four times a day (QID) | ORAL | Status: DC | PRN

## 2022-04-25 MED ORDER — HYDROXYZINE HCL 25 MG PO TABS: 25.0000 mg | ORAL_TABLET | Freq: Three times a day (TID) | ORAL | Status: DC | PRN

## 2022-04-25 MED ORDER — MAGNESIUM HYDROXIDE 400 MG/5ML PO SUSP: 30.0000 mL | Freq: Every day | ORAL | Status: DC | PRN

## 2022-04-25 MED ORDER — TRAZODONE HCL 100 MG PO TABS: 50.0000 mg | ORAL_TABLET | Freq: Every evening | ORAL | Status: DC | PRN

## 2022-04-25 NOTE — ED Triage Notes (Addendum)
Patient states she is having mental health breakdown. Patient has flight of ideas and rushed speaking. Patient keeps saying she "snapped" and she is having a "mental defect." Patient states she has thought os harming herself and she came in today because of it.

## 2022-04-25 NOTE — ED Notes (Signed)
Pt has 1 belongings bag in triage pt belonging cabinet

## 2022-04-25 NOTE — ED Notes (Signed)
Spoke with Larose Kells at [facility]. She states that they are ready for report and transfer. Primary RN made aware.

## 2022-04-25 NOTE — ED Notes (Signed)
Patient transported by [facility] to [facility] at this time.  Personal belongings transported with patient

## 2022-04-25 NOTE — ED Notes (Signed)
[facility] [employer] called and transportation arranged.

## 2022-04-25 NOTE — ED Provider Notes (Signed)
[facility] DEPT Provider Note   CSN: [id number] Arrival date & time: 04/25/22  0111     History  Chief Complaint  Patient presents with   Mental Health Problem   Suicidal    [name] is a 51 y.o. female who presents with concern for reportedly having "snapped".  She keeps saying that she is having a "mental defect".  Patient is tangential in her speech, endorses suicidality and denies plan but then rattles off a list of several things including using a gun, overdosing, using knives, or stepping into traffic as ways that she could die if she chose to take her own life.  She is poorly directable, but calm.  Keeps stating that she is "deeply deeply sad".  Patient with history of schizoaffective disorder bipolar type versus schizophrenia, both diagnoses listed in chart. States she is currently not taking any medications.  HPI     Home Medications Prior to Admission medications   Medication Sig Start Date End Date Taking? Authorizing Provider  albuterol (VENTOLIN HFA) 108 (90 Base) MCG/ACT inhaler Inhale 2 puffs into the lungs every 6 (six) hours as needed for wheezing or shortness of breath.    [provider]  Ascorbic Acid (VITAMIN C WITH ROSE HIPS) 500 MG tablet Take 1,000 mg by mouth daily.    [provider]  atorvastatin (LIPITOR) 10 MG tablet Take 1 tablet (10 mg total) by mouth daily. Patient taking differently: Take 10 mg by mouth at bedtime. 08/25/21   Rosezetta Schlatter, MD  budesonide-formoterol (SYMBICORT) 160-4.5 MCG/ACT inhaler Inhale 2 puffs into the lungs 2 (two) times daily. 02/20/22   [provider]  cloZAPine (CLOZARIL) 25 MG tablet Take 1 tablet (25 mg total) by mouth daily. Patient not taking: Reported on 03/25/2022 08/25/21   Rosezetta Schlatter, MD  cloZAPine (CLOZARIL) 25 MG tablet Take 3 tablets (75 mg total) by mouth at bedtime. Patient not taking: Reported on 03/25/2022 08/25/21   Rosezetta Schlatter, MD   divalproex (DEPAKOTE ER) 500 MG 24 hr tablet Take 2 tablets (1,000 mg total) by mouth at bedtime. 08/25/21   Rosezetta Schlatter, MD  docusate sodium (COLACE) 100 MG capsule Take 1 capsule (100 mg total) by mouth daily. 08/25/21   Rosezetta Schlatter, MD  hydrOXYzine (VISTARIL) 25 MG capsule Take 1 capsule (25 mg total) by mouth 3 (three) times daily as needed for anxiety. 08/25/21   Rosezetta Schlatter, MD  ibuprofen (ADVIL) 200 MG tablet Take 400 mg by mouth every 6 (six) hours as needed for moderate pain or headache.    [provider]  melatonin 3 MG TABS tablet Take 1 tablet (3 mg total) by mouth at bedtime. Patient not taking: Reported on 03/25/2022 08/25/21   Rosezetta Schlatter, MD  metFORMIN (GLUCOPHAGE) 500 MG tablet Take 1 tablet (500 mg total) by mouth daily with breakfast. Patient not taking: Reported on 03/25/2022 08/25/21   Rosezetta Schlatter, MD  mometasone-formoterol (DULERA) 200-5 MCG/ACT AERO Inhale 2 puffs into the lungs 2 (two) times daily. Patient not taking: Reported on 03/25/2022 08/25/21   Rosezetta Schlatter, MD  OLANZapine (ZYPREXA) 10 MG tablet Take 10 mg by mouth at bedtime. 01/16/22   [provider]  paliperidone Palmitate ER (INVEGA TRINZA) 819 MG/2.63ML injection Inject 819 mg into the muscle every 3 (three) months. +/- 14 days of due date    [provider]  ferrous sulfate 325 (65 FE) MG tablet Take 1 tablet (325 mg total) by mouth daily with breakfast. Patient not  taking: No sig reported 12/24/18 04/20/19  [name], MD      Allergies    Haldol [haloperidol decanoate], Penicillins, Pollen extract, and Shrimp [shellfish allergy]    Review of Systems   Review of Systems  Psychiatric/Behavioral:  Positive for agitation and suicidal ideas.     Physical Exam Updated Vital Signs BP 103/61   Pulse 71   Temp 98 F (36.7 C) (Oral)   Resp 17   Ht '5\' 6"'$  (1.676 m)   Wt 95.1 kg   SpO2 100%   BMI 33.84 kg/m  Physical Exam Vitals and nursing note reviewed.   Constitutional:      Appearance: She is obese. She is not ill-appearing or toxic-appearing.  HENT:     Head: Normocephalic and atraumatic.     Nose: Nose normal.     Mouth/Throat:     Mouth: Mucous membranes are moist.     Pharynx: No oropharyngeal exudate or posterior oropharyngeal erythema.  Eyes:     General:        Right eye: No discharge.        Left eye: No discharge.     Extraocular Movements: Extraocular movements intact.     Conjunctiva/sclera: Conjunctivae normal.     Pupils: Pupils are equal, round, and reactive to light.  Cardiovascular:     Rate and Rhythm: Normal rate and regular rhythm.     Pulses: Normal pulses.     Heart sounds: Normal heart sounds. No murmur heard. Pulmonary:     Effort: Pulmonary effort is normal. No respiratory distress.     Breath sounds: Normal breath sounds. No wheezing or rales.  Abdominal:     General: Bowel sounds are normal. There is no distension.     Palpations: Abdomen is soft.     Tenderness: There is no abdominal tenderness. There is no guarding or rebound.  Musculoskeletal:        General: No deformity.     Cervical back: Neck supple.     Right lower leg: No edema.     Left lower leg: No edema.  Skin:    General: Skin is warm and dry.     Capillary Refill: Capillary refill takes less than 2 seconds.  Neurological:     General: No focal deficit present.     Mental Status: She is alert and oriented to person, place, and time. Mental status is at baseline.  Psychiatric:        Mood and Affect: Mood is anxious. Affect is tearful.        Speech: Speech is rapid and pressured and tangential.        Behavior: Behavior is cooperative.        Thought Content: Thought content includes suicidal ideation. Thought content does not include homicidal ideation. Thought content includes suicidal plan. Thought content does not include homicidal plan.     Comments: Does not appear to be responding to internal stimuli.     ED Results /  Procedures / Treatments   Labs (all labs ordered are listed, but only abnormal results are displayed) Labs Reviewed  COMPREHENSIVE METABOLIC PANEL - Abnormal; Notable for the following components:      Result Value   Glucose, Bld 103 (*)    All other components within normal limits  SALICYLATE LEVEL - Abnormal; Notable for the following components:   Salicylate Lvl <1.7 (*)    All other components within normal limits  ACETAMINOPHEN LEVEL - Abnormal; Notable for the  following components:   Acetaminophen (Tylenol), Serum <10 (*)    All other components within normal limits  CBC - Abnormal; Notable for the following components:   Hemoglobin 11.2 (*)    HCT 35.3 (*)    All other components within normal limits  ETHANOL  RAPID URINE DRUG SCREEN, HOSP PERFORMED  I-STAT BETA HCG BLOOD, ED ([facility], [facility], [facility] ONLY)    EKG None  Radiology No results found.  Procedures Procedures    Medications Ordered in ED Medications - No data to display  ED Course/ Medical Decision Making/ A&P                           Medical Decision Making 51 year old female presents with concern for suicidality and schizophrenia not currently compliant with medications.  Vital signs normal intake.  Cardiopulmonary abdominal signs are benign.  Patient is neurovascular intact in all extremities.  Psychiatric exam as above.  Patient tangential, rapid and pressured in her speech, tearful.  SI but no HI, does not appear responding to internal stimuli.  Amount and/or Complexity of Data Reviewed Labs: ordered.    Details:  CBC without cytosis and with mild anemia with hemoglobin of 11 near patient's baseline.  CMP without acute renal or hepatic dysfunction, normal electrolytes.  Salicylate acetaminophen and alcohol levels are normal.  Patient is not pregnant.   Patient is medically cleared awaiting TTS evaluation at this time.  She remains voluntary in the emergency department, calm and cooperative.  Disposition  pending psychiatric recommendations.  This chart was dictated using voice recognition software, Dragon. Despite the best efforts of this provider to proofread and correct errors, errors may still occur which can change documentation meaning.   Final Clinical Impression(s) / ED Diagnoses Final diagnoses:  None    Rx / DC Orders ED Discharge Orders     None         Aura Dials 04/25/22 0708    Mesner, Corene Cornea, MD 04/26/22 0005

## 2022-04-25 NOTE — Progress Notes (Addendum)
Pt was accepted to [facility] Today 04/25/22; Bed Assignment 406-1  Pt meets inpatient criteria per Lindon Romp, NP  Attending Physician will be  Dr Nelda Marseille.  Report can be called to: [phone]  Pt can arrive after: [place] to coordinate   Nadara Mode, [place] 04/25/2022 @ 11:55 PM

## 2022-04-25 NOTE — Consult Note (Signed)
[facility] ED ASSESSMENT   Reason for Consult:  Psychosis Referring Physician:  Jamesetta Orleans, PA Patient Identification: [name] MRN:  [id number] ED Chief Complaint: Schizoaffective disorder, bipolar type ([place])  Diagnosis:  Principal Problem:   Schizoaffective disorder, bipolar type ([place]) Active Problems:   Suicidal ideation   ED Assessment Time Calculation: Start Time: 0950 Stop Time: 1020 Total Time in Minutes (Assessment Completion): 30   Subjective:   [name] is a 51 y.o. female patient with a history of schizoaffective disorder-bipolar type who presents to [facility] due to concern for SI and reportedly having "snapped." Patient is well known to behavioral health and this provider.    HPI:   As this provider entered the room, she states "Hey Mr. Gwenlyn Found, I remember you, you are always calm when I talk to you." [name] speech is pressured and tangential. Very difficult to follow at times. Patient states "I want to kill myself. No plan or intent or anything like that. I don't know what's wrong. I don't think I was born with a mental illness, but something is not right." Patient then makes comments about homosexuality, transgender, and people putting bananas in their vaginas. Makes comments about social programming. States "I see my name scroll across the news station saying I am deranged." She states "I don't think I have hallucinations, but people give me medicine that says it will cause hallucinations."  Patient reports that she saw her ACT team on 8/6 after discharge from [facility]. Patient states, "I see whole bunch of Act teams. I see all of them." On chart review, it is noted that the patient is with [facility].   On evaluation patient is alert and oriented x 4, pleasant, and cooperative. Speech is clear, pressured, and tangential.  Mood is depressed and affect is congruent with mood. Thought process is disorganized. Thought content is disorganized, illogical, and consists of  delusions. Denies auditory and visual hallucinations. No indication that patient is responding to internal stimuli. Endorses SI with no plan or intent. Denies HI.   Past Psychiatric History:  Previous Psych Diagnoses: Schizoaffective- BP type Prior inpatient treatment: [facility] 08/23, [facility] 07/2021; 09/27/19; July 2020 [place] Current/prior outpatient treatment: [facility],  Prior rehab hx: Denies Psychotherapy hx: Denies History of suicide: Reports suicide attempt in college by cutting wrists History of homicide: Denies Psychiatric medication history: Prev trials- Depakote ER, Risperdal (helped to calm her), Abilify PO and LAI, PO paliperidone while titrating LAI; haldol (allergies), prolixin, invega , zyprexa, seroquel Neuromodulation history: ECT in 1993 Current Psychiatrist: [facility] Current therapist: Denies  Risk to Self or Others: Is the patient at risk to self? Yes Has the patient been a risk to self in the past 6 months? Yes Has the patient been a risk to self within the distant past? Yes Is the patient a risk to others? No Has the patient been a risk to others in the past 6 months? No Has the patient been a risk to others within the distant past? No  [place] Scale:  [facility] ED from 04/25/2022 in [facility] DEPT ED from 03/24/2022 in [facility] DEPT Pre-admit (Canceled) from 12/19/2018 in [facility]  C-SSRS RISK CATEGORY Moderate Risk No Risk No Risk       AIMS:  , , ,  ,   ASAM:    Substance Abuse:     Past Medical History:  Past Medical History:  Diagnosis Date   Anxiety  Asthma    Bipolar 1 disorder ([place])    Depression    Gallstones 08/2020   Hypertension    Insomnia, persistent    Prediabetes    Schizophrenic disorder (HCC)    Seizures ([place])     Past Surgical History:  Procedure Laterality Date   BREAST LUMPECTOMY Left 07/2013   TONSILLECTOMY      TUBAL LIGATION  2002   Family History:  Family History  Problem Relation Age of Onset   Depression Mother    Gout Mother    Cancer Father        prostate   Other Father        lung issue   Alcoholism Other    Heart attack Paternal Grandfather    Heart attack Paternal Grandmother    Heart attack Maternal Grandmother    Heart attack Maternal Grandfather    Depression Son    Anxiety disorder Son     Social History:  Social History   Substance and Sexual Activity  Alcohol Use Not Currently     Social History   Substance and Sexual Activity  Drug Use No    Social History   Socioeconomic History   Marital status: Single    Spouse name: Not on file   Number of children: Not on file   Years of education: Not on file   Highest education level: Not on file  Occupational History   Not on file  Tobacco Use   Smoking status: Former    Types: Cigarettes   Smokeless tobacco: Never  Vaping Use   Vaping Use: Never used  Substance and Sexual Activity   Alcohol use: Not Currently   Drug use: No   Sexual activity: Not Currently  Other Topics Concern   Not on file  Social History Narrative   Not on file   Social Determinants of Health   Financial Resource Strain: Not on file  Food Insecurity: Food Insecurity Present (09/16/2020)   Hunger Vital Sign    Worried About Running Out of Food in the Last Year: Sometimes true    Ran Out of Food in the Last Year: Sometimes true  Transportation Needs: No Transportation Needs (09/16/2020)   PRAPARE - Hydrologist (Medical): No    Lack of Transportation (Non-Medical): No  Physical Activity: Not on file  Stress: Not on file  Social Connections: Not on file   Additional Social History:    Allergies:   Allergies  Allergen Reactions   Haldol [Haloperidol Decanoate] Other (See Comments)    Stiffness, eyes bulging   Penicillins Nausea And Vomiting    Has patient had a PCN reaction causing immediate  rash, facial/tongue/throat swelling, SOB or lightheadedness with hypotension:UNSURE  Has patient had a PCN reaction causing severe rash involving mucus membranes or skin necrosis: UNSURE Has patient had a PCN reaction that required hospitalization:UNSURE Has patient had a PCN reaction occurring within the last 10 years:No If all of the above answers are "NO", then may proceed with Cephalosporin use. CHILDHOOD REACTION   Pollen Extract Other (See Comments)    Seasonal allergies   Shrimp [Shellfish Allergy] Rash    Labs:  Results for orders placed or performed during the hospital encounter of 04/25/22 (from the past 48 hour(s))  Comprehensive metabolic panel     Status: Abnormal   Collection Time: 04/25/22  4:05 AM  Result Value Ref Range   Sodium 139 135 - 145 mmol/L   Potassium 3.5  3.5 - 5.1 mmol/L   Chloride 108 98 - 111 mmol/L   CO2 23 22 - 32 mmol/L   Glucose, Bld 103 (H) 70 - 99 mg/dL    Comment: Glucose reference range applies only to samples taken after fasting for at least 8 hours.   BUN 10 6 - 20 mg/dL   Creatinine, Ser 0.72 0.44 - 1.00 mg/dL   Calcium 9.3 8.9 - 10.3 mg/dL   Total Protein 7.6 6.5 - 8.1 g/dL   Albumin 4.1 3.5 - 5.0 g/dL   AST 17 15 - 41 U/L   ALT 15 0 - 44 U/L   Alkaline Phosphatase 63 38 - 126 U/L   Total Bilirubin 0.6 0.3 - 1.2 mg/dL   GFR, Estimated >60 >60 mL/min    Comment: (NOTE) Calculated using the CKD-EPI Creatinine Equation (2021)    Anion gap 8 5 - 15    Comment: Performed at [facility], [place] [address], [place], [place] [address]  Ethanol     Status: None   Collection Time: 04/25/22  4:05 AM  Result Value Ref Range   Alcohol, Ethyl (B) <10 <10 mg/dL    Comment: (NOTE) Lowest detectable limit for serum alcohol is 10 mg/dL.  For medical purposes only. Performed at [facility], [place] [address], [place], [place] [address]   Salicylate level     Status: Abnormal   Collection Time: 04/25/22  4:05 AM   Result Value Ref Range   Salicylate Lvl <9.9 (L) 7.0 - 30.0 mg/dL    Comment: Performed at [facility], [place] [address], [place], [place] [address]  Acetaminophen level     Status: Abnormal   Collection Time: 04/25/22  4:05 AM  Result Value Ref Range   Acetaminophen (Tylenol), Serum <10 (L) 10 - 30 ug/mL    Comment: (NOTE) Therapeutic concentrations vary significantly. A range of 10-30 ug/mL  may be an effective concentration for many patients. However, some  are best treated at concentrations outside of this range. Acetaminophen concentrations >150 ug/mL at 4 hours after ingestion  and >50 ug/mL at 12 hours after ingestion are often associated with  toxic reactions.  Performed at [facility], [place] [address], [place], [place] [address]   cbc     Status: Abnormal   Collection Time: 04/25/22  4:05 AM  Result Value Ref Range   WBC 5.3 4.0 - 10.5 K/uL   RBC 3.91 3.87 - 5.11 MIL/uL   Hemoglobin 11.2 (L) 12.0 - 15.0 g/dL   HCT 35.3 (L) 36.0 - 46.0 %   MCV 90.3 80.0 - 100.0 fL   MCH 28.6 26.0 - 34.0 pg   MCHC 31.7 30.0 - 36.0 g/dL   RDW 14.2 11.5 - 15.5 %   Platelets 234 150 - 400 K/uL   nRBC 0.0 0.0 - 0.2 %    Comment: Performed at [facility], [place] [address] [place], [place] [address]  I-Stat beta hCG blood, ED     Status: None   Collection Time: 04/25/22  4:08 AM  Result Value Ref Range   I-stat hCG, quantitative <5.0 <5 mIU/mL   Comment 3            Comment:   GEST. AGE      CONC.  (mIU/mL)   <=1 WEEK        5 - 50     2 WEEKS       50 - 500  3 WEEKS       100 - 10,000     4 WEEKS     1,000 - 30,000        FEMALE AND NON-PREGNANT FEMALE:     LESS THAN 5 mIU/mL   Rapid urine drug screen (hospital performed)     Status: None   Collection Time: 04/25/22  7:32 AM  Result Value Ref Range   Opiates NONE DETECTED NONE DETECTED   Cocaine NONE DETECTED NONE DETECTED   Benzodiazepines NONE DETECTED NONE DETECTED    Amphetamines NONE DETECTED NONE DETECTED   Tetrahydrocannabinol NONE DETECTED NONE DETECTED   Barbiturates NONE DETECTED NONE DETECTED    Comment: (NOTE) DRUG SCREEN FOR MEDICAL PURPOSES ONLY.  IF CONFIRMATION IS NEEDED FOR ANY PURPOSE, NOTIFY LAB WITHIN 5 DAYS.  LOWEST DETECTABLE LIMITS FOR URINE DRUG SCREEN Drug Class                     Cutoff (ng/mL) Amphetamine and metabolites    1000 Barbiturate and metabolites    200 Benzodiazepine                 440 Tricyclics and metabolites     300 Opiates and metabolites        300 Cocaine and metabolites        300 THC                            50 Performed at [facility], [place] [address], [place],  [address]     No current facility-administered medications for this encounter.   Current Outpatient Medications  Medication Sig Dispense Refill   albuterol (VENTOLIN HFA) 108 (90 Base) MCG/ACT inhaler Inhale 2 puffs into the lungs every 6 (six) hours as needed for wheezing or shortness of breath.     Ascorbic Acid (VITAMIN C WITH ROSE HIPS) 500 MG tablet Take 1,000 mg by mouth daily.     atorvastatin (LIPITOR) 10 MG tablet Take 1 tablet (10 mg total) by mouth daily. (Patient taking differently: Take 10 mg by mouth at bedtime.) 30 tablet 0   budesonide-formoterol (SYMBICORT) 160-4.5 MCG/ACT inhaler Inhale 2 puffs into the lungs 2 (two) times daily.     cloZAPine (CLOZARIL) 25 MG tablet Take 1 tablet (25 mg total) by mouth daily. (Patient not taking: Reported on 03/25/2022) 30 tablet 0   cloZAPine (CLOZARIL) 25 MG tablet Take 3 tablets (75 mg total) by mouth at bedtime. (Patient not taking: Reported on 03/25/2022) 30 tablet 0   divalproex (DEPAKOTE ER) 500 MG 24 hr tablet Take 2 tablets (1,000 mg total) by mouth at bedtime. 60 tablet 0   docusate sodium (COLACE) 100 MG capsule Take 1 capsule (100 mg total) by mouth daily. 30 capsule 0   hydrOXYzine (VISTARIL) 25 MG capsule Take 1 capsule (25 mg total) by mouth 3  (three) times daily as needed for anxiety. 30 capsule 0   ibuprofen (ADVIL) 200 MG tablet Take 400 mg by mouth every 6 (six) hours as needed for moderate pain or headache.     melatonin 3 MG TABS tablet Take 1 tablet (3 mg total) by mouth at bedtime. (Patient not taking: Reported on 03/25/2022) 30 tablet 0   metFORMIN (GLUCOPHAGE) 500 MG tablet Take 1 tablet (500 mg total) by mouth daily with breakfast. (Patient not taking: Reported on 03/25/2022) 30 tablet 0   mometasone-formoterol (DULERA) 200-5 MCG/ACT AERO Inhale 2  puffs into the lungs 2 (two) times daily. (Patient not taking: Reported on 03/25/2022) 1 each 0   OLANZapine (ZYPREXA) 10 MG tablet Take 10 mg by mouth at bedtime.     paliperidone Palmitate ER (INVEGA TRINZA) 819 MG/2.63ML injection Inject 819 mg into the muscle every 3 (three) months. +/- 14 days of due date      Musculoskeletal: Strength & Muscle Tone: within normal limits Gait & Station: normal Patient leans: N/A   Psychiatric Specialty Exam: Presentation  General Appearance: Disheveled  Eye Contact:Fair  Speech:Pressured  Speech Volume:Normal  Handedness:Right   Mood and Affect  Mood:Dysphoric  Affect:Blunt   Thought Process  Thought Processes:Disorganized  Descriptions of Associations:Tangential  Orientation:Full (Time, Place and Person)  Thought Content:Illogical; Tangential  History of Schizophrenia/Schizoaffective disorder:Yes  Duration of Psychotic Symptoms:Greater than six months  Hallucinations:Hallucinations: None  Ideas of Reference:Delusions; Paranoia; Percusatory  Suicidal Thoughts:Suicidal Thoughts: Yes, Active SI Active Intent and/or Plan: Without Intent; Without Plan  Homicidal Thoughts:Homicidal Thoughts: No   Sensorium  Memory:Immediate Fair; Recent Fair; Remote Fair  Judgment:Impaired  Insight:Poor   Executive Functions  Concentration:Poor  Attention Span:Poor  Recall:Poor  Fund of  Knowledge:Fair  Language:Fair   Psychomotor Activity  Psychomotor Activity:Psychomotor Activity: Normal   Assets  Assets:Desire for Improvement; Financial Resources/Insurance; Housing; Social Support; Resilience    Sleep  Sleep:Sleep: Fair   Physical Exam: Physical Exam Constitutional:      General: She is not in acute distress.    Appearance: She is not ill-appearing, toxic-appearing or diaphoretic.  HENT:     Right Ear: External ear normal.     Left Ear: External ear normal.  Eyes:     General:        Right eye: No discharge.        Left eye: No discharge.  Cardiovascular:     Rate and Rhythm: Normal rate.  Pulmonary:     Effort: Pulmonary effort is normal. No respiratory distress.  Musculoskeletal:        General: Normal range of motion.     Cervical back: Normal range of motion.  Neurological:     Mental Status: She is alert and oriented to person, place, and time.  Psychiatric:        Mood and Affect: Mood is anxious and depressed.        Behavior: Behavior is cooperative.        Thought Content: Thought content is delusional. Thought content includes suicidal ideation. Thought content does not include homicidal ideation.    Review of Systems  Constitutional:  Negative for diaphoresis.  HENT:  Negative for congestion.   Respiratory:  Negative for cough and shortness of breath.   Cardiovascular:  Negative for chest pain.  Gastrointestinal:  Negative for diarrhea, nausea and vomiting.  Psychiatric/Behavioral:  Positive for depression and suicidal ideas. Negative for hallucinations, memory loss and substance abuse. The patient is nervous/anxious and has insomnia.   All other systems reviewed and are negative.  Blood pressure 124/88, pulse 73, temperature (!) 97.4 F (36.3 C), temperature source Oral, resp. rate 16, height '5\' 6"'$  (1.676 m), weight 95.1 kg, SpO2 100 %. Body mass index is 33.84 kg/m.  Medical Decision Making: Based on this provider's previous  interactions with the patient, she is not at her baseline.  Recommend inpatient psychiatric treatment.  Medications need to be reviewed by pharmacy.    Problem 1: Schizoaffective disorder, bipolar type  Problem 2: SI    Disposition: Recommend psychiatric Inpatient admission when  medically cleared.  Rozetta Nunnery, NP 04/25/2022 11:01 AM

## 2022-04-25 NOTE — BH Assessment (Signed)
Per Dr. Dwyane Dee in the 9am morning huddle/bed meeting, Provider to assess. No TTS CCA is needed at this time.

## 2022-04-25 NOTE — BH Assessment (Addendum)
[place] Assessment Progress Note   Per Lindon Romp, NP, this pt requires psychiatric hospitalization at this time.  [place], RN, [facility] reports that [facility] will have a bed for this pt later today.  He advises this Probation officer to have pt's sign consents.  Pt has signed Voluntary Admission and Consent for Treatment, as well as Consent to Release Information to her putative [place] (no document on chart), and the [facility] ACT Team, and signed forms have been faxed to [facility]  EDP Teressa Lower, MD and pt's nurses, Olene Floss and [place], have been notified.  Please send original paperwork along with pt via [employer], and call report to [phone] when the time comes.  At 12:25 I called the [facility] Team and spoke to [place], informing her of pt's disposition.  They report that pt receives an [place] injection, 819 mg every three months, with the most recent injection on 02/17/2022.  Jalene Mullet, [place] [facility] Coordinator [phone]  Addendum:  Per Dietrich Pates, pt will go to [facility] [phone] to Dr Nelda Marseille.  Night AC at [facility] will contact [facility] to arrange a time for transfer.  EDP Georgina Snell, MD has been notified, along with the other parties noted above.  Jalene Mullet, [place] Coordinator [phone]

## 2022-04-26 ENCOUNTER — Encounter (HOSPITAL_COMMUNITY): Payer: Self-pay | Admitting: Emergency Medicine

## 2022-04-26 ENCOUNTER — Other Ambulatory Visit: Payer: Self-pay

## 2022-04-26 ENCOUNTER — Inpatient Hospital Stay (HOSPITAL_COMMUNITY)
Admission: AD | Admit: 2022-04-26 | Discharge: 2022-05-10 | DRG: 885 | Disposition: A | Discharge status: Home / Self Care | Payer: Medicare Other | Source: Intra-hospital | Attending: Psychiatry | Admitting: Psychiatry

## 2022-04-26 ENCOUNTER — Encounter (HOSPITAL_COMMUNITY): Payer: Self-pay

## 2022-04-26 DIAGNOSIS — Z8249 Family history of ischemic heart disease and other diseases of the circulatory system: Secondary | ICD-10-CM | POA: Diagnosis not present

## 2022-04-26 DIAGNOSIS — F250 Schizoaffective disorder, bipolar type: Secondary | ICD-10-CM | POA: Diagnosis present

## 2022-04-26 DIAGNOSIS — G4700 Insomnia, unspecified: Secondary | ICD-10-CM | POA: Diagnosis present

## 2022-04-26 DIAGNOSIS — Z87891 Personal history of nicotine dependence: Secondary | ICD-10-CM | POA: Diagnosis not present

## 2022-04-26 DIAGNOSIS — I10 Essential (primary) hypertension: Secondary | ICD-10-CM | POA: Diagnosis present

## 2022-04-26 DIAGNOSIS — Z7951 Long term (current) use of inhaled steroids: Secondary | ICD-10-CM | POA: Diagnosis not present

## 2022-04-26 DIAGNOSIS — F419 Anxiety disorder, unspecified: Secondary | ICD-10-CM | POA: Diagnosis present

## 2022-04-26 DIAGNOSIS — R7303 Prediabetes: Secondary | ICD-10-CM | POA: Diagnosis present

## 2022-04-26 DIAGNOSIS — Z20822 Contact with and (suspected) exposure to covid-19: Secondary | ICD-10-CM | POA: Diagnosis present

## 2022-04-26 DIAGNOSIS — D649 Anemia, unspecified: Secondary | ICD-10-CM | POA: Diagnosis present

## 2022-04-26 DIAGNOSIS — Z79899 Other long term (current) drug therapy: Secondary | ICD-10-CM | POA: Diagnosis not present

## 2022-04-26 DIAGNOSIS — R45851 Suicidal ideations: Secondary | ICD-10-CM

## 2022-04-26 DIAGNOSIS — Z9151 Personal history of suicidal behavior: Secondary | ICD-10-CM | POA: Diagnosis not present

## 2022-04-26 DIAGNOSIS — I951 Orthostatic hypotension: Secondary | ICD-10-CM | POA: Diagnosis present

## 2022-04-26 DIAGNOSIS — Z818 Family history of other mental and behavioral disorders: Secondary | ICD-10-CM | POA: Diagnosis not present

## 2022-04-26 DIAGNOSIS — E785 Hyperlipidemia, unspecified: Secondary | ICD-10-CM | POA: Diagnosis present

## 2022-04-26 MED ORDER — OLANZAPINE 5 MG PO TBDP: 5.0000 mg | ORAL_TABLET | Freq: Three times a day (TID) | ORAL | Status: DC | PRN

## 2022-04-26 MED ORDER — ASCORBIC ACID 500 MG PO TABS
500.0000 mg | ORAL_TABLET | Freq: Two times a day (BID) | ORAL | Status: DC
  Administered 2022-04-26 – 2022-05-10 (×29): 500 mg via ORAL
  Filled 2022-04-26 (×33): qty 1

## 2022-04-26 MED ORDER — TRAZODONE HCL 50 MG PO TABS
50.0000 mg | ORAL_TABLET | Freq: Every evening | ORAL | Status: DC | PRN
Start: 2022-04-26 — End: 2022-04-30

## 2022-04-26 MED ORDER — ATORVASTATIN CALCIUM 10 MG PO TABS
10.0000 mg | ORAL_TABLET | Freq: Every day | ORAL | Status: DC
  Administered 2022-04-26 – 2022-05-10 (×15): 10 mg via ORAL
  Filled 2022-04-26 (×16): qty 1

## 2022-04-26 MED ORDER — DIVALPROEX SODIUM ER 500 MG PO TB24
1000.0000 mg | ORAL_TABLET | Freq: Every day | ORAL | Status: DC
  Administered 2022-04-26 – 2022-05-09 (×14): 1000 mg via ORAL
  Filled 2022-04-26 (×17): qty 2

## 2022-04-26 MED ORDER — MELATONIN 3 MG PO TABS
3.0000 mg | ORAL_TABLET | Freq: Every day | ORAL | Status: DC
  Administered 2022-04-26 – 2022-04-29 (×5): 3 mg via ORAL
  Filled 2022-04-26 (×8): qty 1

## 2022-04-26 MED ORDER — MAGNESIUM HYDROXIDE 400 MG/5ML PO SUSP: 30.0000 mL | Freq: Every day | ORAL | Status: DC | PRN

## 2022-04-26 MED ORDER — OLANZAPINE 10 MG PO TABS
20.0000 mg | ORAL_TABLET | Freq: Every day | ORAL | Status: DC
  Administered 2022-04-26 – 2022-04-28 (×3): 20 mg via ORAL
  Filled 2022-04-26 (×5): qty 2

## 2022-04-26 MED ORDER — ZIPRASIDONE MESYLATE 20 MG IM SOLR: 20.0000 mg | INTRAMUSCULAR | Status: DC | PRN

## 2022-04-26 MED ORDER — OLANZAPINE 10 MG PO TABS
10.0000 mg | ORAL_TABLET | Freq: Every day | ORAL | Status: DC
  Administered 2022-04-26: 10 mg via ORAL
  Filled 2022-04-26 (×4): qty 1

## 2022-04-26 MED ORDER — ALUM & MAG HYDROXIDE-SIMETH 200-200-20 MG/5ML PO SUSP: 30.0000 mL | ORAL | Status: DC | PRN

## 2022-04-26 MED ORDER — ACETAMINOPHEN 325 MG PO TABS: 650.0000 mg | ORAL_TABLET | Freq: Four times a day (QID) | ORAL | Status: DC | PRN

## 2022-04-26 MED ORDER — METFORMIN HCL 500 MG PO TABS
500.0000 mg | ORAL_TABLET | Freq: Every day | ORAL | Status: DC
  Administered 2022-04-26 – 2022-05-10 (×15): 500 mg via ORAL
  Filled 2022-04-26 (×16): qty 1

## 2022-04-26 MED ORDER — LORAZEPAM 1 MG PO TABS: 1.0000 mg | ORAL_TABLET | ORAL | Status: DC | PRN

## 2022-04-26 MED ORDER — HYDROXYZINE HCL 25 MG PO TABS
25.0000 mg | ORAL_TABLET | Freq: Three times a day (TID) | ORAL | Status: DC | PRN
  Administered 2022-05-05: 25 mg via ORAL
  Filled 2022-04-26: qty 1

## 2022-04-26 MED ORDER — DOCUSATE SODIUM 100 MG PO CAPS
100.0000 mg | ORAL_CAPSULE | Freq: Every day | ORAL | Status: DC
  Administered 2022-04-26 – 2022-05-10 (×15): 100 mg via ORAL
  Filled 2022-04-26 (×16): qty 1

## 2022-04-26 MED ORDER — HYDROXYZINE PAMOATE 25 MG PO CAPS: 25.0000 mg | ORAL_CAPSULE | Freq: Three times a day (TID) | ORAL | Status: DC | PRN

## 2022-04-26 MED ORDER — ALBUTEROL SULFATE HFA 108 (90 BASE) MCG/ACT IN AERS: 2.0000 | INHALATION_SPRAY | Freq: Four times a day (QID) | RESPIRATORY_TRACT | Status: DC | PRN

## 2022-04-26 NOTE — Progress Notes (Signed)
Patient rated her day as a 5 out of 10 since "nothing serious" took place today. Her coping skill is to focus on "personal responsibility".

## 2022-04-26 NOTE — BHH Suicide Risk Assessment (Signed)
Suicide Risk Assessment  Admission Assessment    [facility] Admission Suicide Risk Assessment   Nursing information obtained from:  Patient Demographic factors:  NA Current Mental Status:  Suicidal ideation indicated by patient Loss Factors:  NA Historical Factors:  Impulsivity Risk Reduction Factors:  Sense of responsibility to family  Total Time spent with patient: 45 minutes Principal Problem: Schizoaffective disorder, bipolar type ([place]) Diagnosis:  Principal Problem:   Schizoaffective disorder, bipolar type ([place]) Active Problems:   Suicidal ideations  Subjective Data: [name] is a 51 year old female with a psychiatric history of schizoaffective disorder-bipolar type who presented voluntarily from [facility] and admitted for disorganized thoughts, SI, and paranoia.   On Chart Review: Patient has had multiple inpatient psychiatric admissions, with most recent coming from [facility], discharging earlier this month. Patient was likely noncompliant with Clozaril after [facility] discharge 07/2021, and there was no verification that patient was following up on labs, so this medication was officially discontinued 03/25/22 and Zyprexa started.   Continued Clinical Symptoms:  Alcohol Use Disorder Identification Test Final Score (AUDIT): 0 The "Alcohol Use Disorders Identification Test", Guidelines for Use in Primary Care, Second Edition.  World Pharmacologist [facility]). Score between 0-7:  no or low risk or alcohol related problems. Score between 8-15:  moderate risk of alcohol related problems. Score between 16-19:  high risk of alcohol related problems. Score 20 or above:  warrants further diagnostic evaluation for alcohol dependence and treatment.   CLINICAL FACTORS:   Bipolar Disorder:   Mixed State Schizophrenia:   Paranoid or undifferentiated type   Musculoskeletal: Strength & Muscle Tone: within normal limits Gait & Station:  patent in bed, unable to observe Patient leans:  N/A  Psychiatric Specialty Exam:  Presentation  General Appearance: Casual; Disheveled  Eye Contact:Fair  Speech:Pressured; Slurred  Speech Volume:Normal  Handedness:Right   Mood and Affect  Mood:Euthymic  Affect:Blunt   Thought Process  Thought Processes:Disorganized  Descriptions of Associations:Tangential  Orientation:Full (Time, Place and Person)  Thought Content:Illogical; Paranoid Ideation; Tangential  History of Schizophrenia/Schizoaffective disorder:Yes  Duration of Psychotic Symptoms:Greater than six months  Hallucinations:Hallucinations: None  Ideas of Reference:Delusions; Paranoia; Percusatory  Suicidal Thoughts:Suicidal Thoughts: No SI Active Intent and/or Plan: Without Intent; Without Plan  Homicidal Thoughts:Homicidal Thoughts: No   Sensorium  Memory:Immediate Fair; Recent Fair  Judgment:Impaired  Insight:Poor   Executive Functions  Concentration:Poor  Attention Span:Poor  Recall:Poor  Fund of Knowledge:Poor  Language:Fair   Psychomotor Activity  Psychomotor Activity:Psychomotor Activity: Normal   Assets  Assets:Desire for Improvement; Financial Resources/Insurance; Housing; Social Support   Sleep  Sleep:Sleep: Fair    Physical Exam: Vitals reviewed.  Constitutional:      General: She is not in acute distress.    Appearance: She is not toxic-appearing.  HENT:     Head: Normocephalic and atraumatic.     Mouth/Throat:     Mouth: Mucous membranes are moist.     Pharynx: Oropharynx is clear.  Pulmonary:     Effort: Pulmonary effort is normal.  Neurological:     General: No focal deficit present.     Mental Status: She is alert and oriented to person, place, and time.      Review of Systems  Respiratory:  Negative for shortness of breath.   Cardiovascular:  Negative for chest pain.  Gastrointestinal:  Positive for constipation. Negative for abdominal pain.  Musculoskeletal: Negative.   Neurological:  Negative  for headaches. Blood pressure 106/72, pulse 72, temperature 98.3 F (36.8 C), temperature  source Oral, resp. rate 18, height '5\' 6"'$  (1.676 m), weight 104.1 kg, SpO2 99 %. Body mass index is 37.03 kg/m.   COGNITIVE FEATURES THAT CONTRIBUTE TO RISK:  Loss of executive function    SUICIDE RISK:   Moderate:  Frequent suicidal ideation with limited intensity, and duration, some specificity in terms of plans, no associated intent, good self-control, limited dysphoria/symptomatology, some risk factors present, and identifiable protective factors, including available and accessible social support.  PLAN OF CARE:   See H&P for full plan of care  I certify that inpatient services furnished can reasonably be expected to improve the patient's condition.   Rosezetta Schlatter, MD 04/26/2022, 11:03 PM

## 2022-04-26 NOTE — BH IP Treatment Plan (Signed)
Interdisciplinary Treatment and Diagnostic Plan Update  04/26/2022 Time of Session: 9:40am  [name] MRN: [id number]  Principal Diagnosis: Schizoaffective disorder, bipolar type ([place])  Secondary Diagnoses: Principal Problem:   Schizoaffective disorder, bipolar type ([place]) Active Problems:   Suicidal ideations   Current Medications:  Current Facility-Administered Medications  Medication Dose Route Frequency Provider Last Rate Last Admin   acetaminophen (TYLENOL) tablet 650 mg  650 mg Oral Q6H PRN [name], NP       albuterol (VENTOLIN HFA) 108 (90 Base) MCG/ACT inhaler 2 puff  2 puff Inhalation QID PRN [name], NP       alum & mag hydroxide-simeth (MAALOX/MYLANTA) 200-200-20 MG/5ML suspension 30 mL  30 mL Oral Q4H PRN [name], NP       ascorbic acid (VITAMIN C) tablet 500 mg  500 mg Oral BID [name], NP   500 mg at 04/26/22 0900   atorvastatin (LIPITOR) tablet 10 mg  10 mg Oral Daily [name], NP   10 mg at 04/26/22 0900   divalproex (DEPAKOTE ER) 24 hr tablet 1,000 mg  1,000 mg Oral QHS [name], NP   1,000 mg at 04/26/22 0146   docusate sodium (COLACE) capsule 100 mg  100 mg Oral Daily [name], NP   100 mg at 04/26/22 0900   magnesium hydroxide (MILK OF MAGNESIA) suspension 30 mL  30 mL Oral Daily PRN [name], NP       melatonin tablet 3 mg  3 mg Oral QHS [name], NP   3 mg at 04/26/22 0146   metFORMIN (GLUCOPHAGE) tablet 500 mg  500 mg Oral Q breakfast [name], NP   500 mg at 04/26/22 0900   OLANZapine (ZYPREXA) tablet 20 mg  20 mg Oral QHS Rosezetta Schlatter, MD       PTA Medications: Medications Prior to Admission  Medication Sig Dispense Refill Last Dose   albuterol (VENTOLIN HFA) 108 (90 Base) MCG/ACT inhaler Inhale 2 puffs into the lungs 4 (four) times daily as needed for wheezing or shortness of breath.      atorvastatin (LIPITOR) 10 MG tablet Take 1 tablet (10 mg total) by mouth daily. (Patient taking differently: Take  10 mg by mouth at bedtime.) 30 tablet 0    divalproex (DEPAKOTE ER) 500 MG 24 hr tablet Take 2 tablets (1,000 mg total) by mouth at bedtime. 60 tablet 0    OLANZapine (ZYPREXA) 10 MG tablet Take 10 mg by mouth at bedtime.      paliperidone Palmitate ER (INVEGA TRINZA) 819 MG/2.63ML injection Inject 819 mg into the muscle every 3 (three) months. +/- 14 days of due date       Patient Stressors: Medication change or noncompliance    Patient Strengths: Capable of independent living  General fund of knowledge  Supportive family/friends   Treatment Modalities: Medication Management, Group therapy, Case management,  1 to 1 session with clinician, Psychoeducation, Recreational therapy.   Physician Treatment Plan for Primary Diagnosis: Schizoaffective disorder, bipolar type ([place]) Long Term Goal(s): Improvement in symptoms so as ready for discharge   Short Term Goals: Ability to identify changes in lifestyle to reduce recurrence of condition will improve Ability to verbalize feelings will improve Ability to disclose and discuss suicidal ideas Ability to demonstrate self-control will improve Ability to identify and develop effective coping behaviors will improve Ability to maintain clinical measurements within normal limits will improve Compliance with prescribed medications will improve  Medication Management: Evaluate patient's response, side effects, and  tolerance of medication regimen.  Therapeutic Interventions: 1 to 1 sessions, Unit Group sessions and Medication administration.  Evaluation of Outcomes: Not Met  Physician Treatment Plan for Secondary Diagnosis: Principal Problem:   Schizoaffective disorder, bipolar type ([place]) Active Problems:   Suicidal ideations  Long Term Goal(s): Improvement in symptoms so as ready for discharge   Short Term Goals: Ability to identify changes in lifestyle to reduce recurrence of condition will improve Ability to verbalize feelings will  improve Ability to disclose and discuss suicidal ideas Ability to demonstrate self-control will improve Ability to identify and develop effective coping behaviors will improve Ability to maintain clinical measurements within normal limits will improve Compliance with prescribed medications will improve     Medication Management: Evaluate patient's response, side effects, and tolerance of medication regimen.  Therapeutic Interventions: 1 to 1 sessions, Unit Group sessions and Medication administration.  Evaluation of Outcomes: Not Met   RN Treatment Plan for Primary Diagnosis: Schizoaffective disorder, bipolar type ([place]) Long Term Goal(s): Knowledge of disease and therapeutic regimen to maintain health will improve  Short Term Goals: Ability to remain free from injury will improve, Ability to participate in decision making will improve, Ability to verbalize feelings will improve, Ability to disclose and discuss suicidal ideas, and Ability to identify and develop effective coping behaviors will improve  Medication Management: RN will administer medications as ordered by provider, will assess and evaluate patient's response and provide education to patient for prescribed medication. RN will report any adverse and/or side effects to prescribing provider.  Therapeutic Interventions: 1 on 1 counseling sessions, Psychoeducation, Medication administration, Evaluate responses to treatment, Monitor vital signs and CBGs as ordered, Perform/monitor CIWA, COWS, AIMS and Fall Risk screenings as ordered, Perform wound care treatments as ordered.  Evaluation of Outcomes: Not Met   LCSW Treatment Plan for Primary Diagnosis: Schizoaffective disorder, bipolar type ([place]) Long Term Goal(s): Safe transition to appropriate next level of care at discharge, Engage patient in therapeutic group addressing interpersonal concerns.  Short Term Goals: Engage patient in aftercare planning with referrals and resources,  Increase social support, Increase emotional regulation, Facilitate acceptance of mental health diagnosis and concerns, Identify triggers associated with mental health/substance abuse issues, and Increase skills for wellness and recovery  Therapeutic Interventions: Assess for all discharge needs, 1 to 1 time with Social worker, Explore available resources and support systems, Assess for adequacy in community support network, Educate family and significant other(s) on suicide prevention, Complete Psychosocial Assessment, Interpersonal group therapy.  Evaluation of Outcomes: Not Met   Progress in Treatment: Attending groups: No. Participating in groups: No. Taking medication as prescribed: Yes. Toleration medication: Yes. Family/Significant other contact made: Yes, individual(s) contacted:  Daughter  Patient understands diagnosis: No. Discussing patient identified problems/goals with staff: Yes. Medical problems stabilized or resolved: Yes. Denies suicidal/homicidal ideation: Yes. Issues/concerns per patient self-inventory: No.   New problem(s) identified: No, Describe:  None   New Short Term/Long Term Goal(s): medication stabilization, elimination of SI thoughts, development of comprehensive mental wellness plan.   Patient Goals: "Work on my medications and stabilize my mental health"  Discharge Plan or Barriers: Patient recently admitted. CSW will continue to follow and assess for appropriate referrals and possible discharge planning.   Reason for Continuation of Hospitalization: Anxiety Depression Medication stabilization Suicidal ideation  Estimated Length of Stay: 3 to 7 days   Last 3 [place] Suicide Severity Risk Score: Flowsheet Row Admission (Current) from 04/26/2022 in [facility] ED from 04/25/2022 in [place]  [place] DEPT Pre-admit (Canceled) from 12/19/2018 in [facility] [facility]  C-SSRS  RISK CATEGORY Moderate Risk Moderate Risk No Risk       Last PHQ 2/9 Scores:    02/23/2021    8:30 AM 09/16/2020    1:00 PM 10/24/2016    3:24 PM  Depression screen PHQ 2/9  Decreased Interest 1 0 1  Down, Depressed, Hopeless 1 0 1  PHQ - 2 Score 2 0 2  Altered sleeping '2 1 3  ' Tired, decreased energy 0 0 1  Change in appetite 0 0 0  Feeling bad or failure about yourself  1 0 1  Trouble concentrating 0 0 0  Moving slowly or fidgety/restless 0 0 3  Suicidal thoughts 1 0 1  PHQ-9 Score '6 1 11    ' Scribe for Treatment Team: Carney Harder 04/26/2022 2:34 PM
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
## 2022-04-26 NOTE — Plan of Care (Signed)
  Problem: Education: Goal: Emotional status will improve Outcome: Not Progressing Goal: Mental status will improve Outcome: Not Progressing Goal: Verbalization of understanding the information provided will improve Outcome: Not Progressing   

## 2022-04-26 NOTE — Progress Notes (Signed)
Pt has been in bed most of the shift. Pt medication compliant today. EKG performed at this time. Pt denies suicidal thoughts at this time. Some disorganized thought process noted.

## 2022-04-26 NOTE — Progress Notes (Signed)
     04/26/22 2119  Psych Admission Type (Psych Patients Only)  Admission Status Voluntary  Psychosocial Assessment  Patient Complaints Confusion;Worrying  Eye Contact Fair  Facial Expression Flat  Affect Inconsistent with thought content  Speech Tangential  Interaction Assertive  Motor Activity Other (Comment) (WDL)  Appearance/Hygiene Improved  Behavior Characteristics Cooperative;Calm  Mood Suspicious;Pleasant  Thought Process  Coherency Disorganized;Tangential  Content Preoccupation  Delusions Paranoid  Perception WDL  Hallucination None reported or observed  Judgment Poor  Confusion Mild  Danger to Self  Current suicidal ideation? Denies  Self-Injurious Behavior No self-injurious ideation or behavior indicators observed or expressed   Agreement Not to Harm Self Yes  Description of Agreement verbal  Danger to Others  Danger to Others None reported or observed

## 2022-04-26 NOTE — Tx Team (Signed)
Initial Treatment Plan 04/26/2022 2:01 AM [name] FEO:[id number]    PATIENT STRESSORS: Medication change or noncompliance     PATIENT STRENGTHS: Capable of independent living  General fund of knowledge  Supportive family/friends    PATIENT IDENTIFIED PROBLEMS: Psychosis  Suicidal ideation      "Follow up with my treatment"             DISCHARGE CRITERIA:  Improved stabilization in mood, thinking, and/or behavior Need for constant or close observation no longer present Reduction of life-threatening or endangering symptoms to within safe limits Verbal commitment to aftercare and medication compliance  PRELIMINARY DISCHARGE PLAN: Outpatient therapy  PATIENT/FAMILY INVOLVEMENT: This treatment plan has been presented to and reviewed with the patient, [name].  The patient and family have been given the opportunity to ask questions and make suggestions.  [name], RN 04/26/2022, 2:01 AM

## 2022-04-26 NOTE — Group Note (Signed)
LCSW Group Therapy Note   Group Date: 04/26/2022 Start Time: 1300 End Time: 1400  Type of Therapy and Topic:  Group Therapy:  Healthy and Unhealthy Supports  Participation Level:  Did Not Attend   Description of Group:  Patients in this group were introduced to the idea of adding a variety of healthy supports to address the various needs in their lives, especially in reference to their plans and focus for the new year.  Patients discussed what additional healthy supports could be helpful in their recovery and wellness after discharge in order to prevent future hospitalizations.   An emphasis was placed on using counselor, doctor, therapy groups, 12-step groups, and problem-specific support groups to expand supports.    Therapeutic Goals:   1)  discuss importance of adding supports to stay well once out of the hospital  2)  compare healthy versus unhealthy supports and identify some examples of each  3)  generate ideas and descriptions of healthy supports that can be added  4)  offer mutual support about how to address unhealthy supports  5)  encourage active participation in and adherence to discharge plan    Summary of Patient Progress:  Did not attend    Therapeutic Modalities:   [place], [place] 04/26/2022  1:43 PM

## 2022-04-26 NOTE — Progress Notes (Signed)
Patient did not attend morning orientation/goal setting group, although she was made aware of it.

## 2022-04-26 NOTE — Progress Notes (Signed)
[name] is a 51 y.o. female being admitted voluntarily to [facility] from [facility].  She presented to the ED for SI and reported she just "snapped."  She exhibited pressured speech, flight of ideas and delusions. She has a history of schizoaffective disorder-bipolar type with multiple psychiatric hospitalizations.  She was recently d/c'd from [facility].  She has an ACT team ([facility]).  During [facility] admission, she was very disorganized.   Speech was pressured, tangential and illogical.  She reported passive SI but no plan.  She denied HI or VH.  She did mention hearing voices outside of her house at times but none currently.  She does not appear to be responding to internal stimuli.  Oriented her to the unit.  Admission paperwork completed and signed.  Belongings searched and secured in locker # 29.  Skin assessment completed and no skin issues noted.  Suicide safety plan reviewed, given to patient to complete and return to her nurse.  Q 15 minute checks initiated for safety.  We will monitor the progress towards her goals.
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
## 2022-04-27 DIAGNOSIS — F250 Schizoaffective disorder, bipolar type: Secondary | ICD-10-CM | POA: Diagnosis not present

## 2022-04-27 LAB — CBC WITH DIFFERENTIAL/PLATELET
Abs Immature Granulocytes: 0.01 10*3/uL (ref 0.00–0.07)
Basophils Absolute: 0 10*3/uL (ref 0.0–0.1)
Basophils Relative: 1 %
Eosinophils Absolute: 0.3 10*3/uL (ref 0.0–0.5)
Eosinophils Relative: 4 %
HCT: 38 % (ref 36.0–46.0)
Hemoglobin: 12.1 g/dL (ref 12.0–15.0)
Immature Granulocytes: 0 %
Lymphocytes Relative: 44 %
Lymphs Abs: 2.6 10*3/uL (ref 0.7–4.0)
MCH: 29.1 pg (ref 26.0–34.0)
MCHC: 31.8 g/dL (ref 30.0–36.0)
MCV: 91.3 fL (ref 80.0–100.0)
Monocytes Absolute: 0.4 10*3/uL (ref 0.1–1.0)
Monocytes Relative: 6 %
Neutro Abs: 2.7 10*3/uL (ref 1.7–7.7)
Neutrophils Relative %: 45 %
Platelets: 257 10*3/uL (ref 150–400)
RBC: 4.16 MIL/uL (ref 3.87–5.11)
RDW: 13.8 % (ref 11.5–15.5)
WBC: 5.9 10*3/uL (ref 4.0–10.5)
nRBC: 0 % (ref 0.0–0.2)

## 2022-04-27 LAB — TSH: TSH: 0.806 u[IU]/mL (ref 0.350–4.500)

## 2022-04-27 LAB — VALPROIC ACID LEVEL: Valproic Acid Lvl: 62 ug/mL (ref 50.0–100.0)

## 2022-04-27 LAB — HEMOGLOBIN A1C
Hgb A1c MFr Bld: 5.5 % (ref 4.8–5.6)
Mean Plasma Glucose: 111.15 mg/dL

## 2022-04-27 LAB — LIPID PANEL
Cholesterol: 150 mg/dL (ref 0–200)
HDL: 41 mg/dL (ref >40)
LDL Cholesterol: 96 mg/dL (ref 0–99)
Total CHOL/HDL Ratio: 3.7 RATIO
Triglycerides: 66 mg/dL (ref <150)
VLDL: 13 mg/dL (ref 0–40)

## 2022-04-27 NOTE — Plan of Care (Signed)
  Problem: Education: Goal: Knowledge of [facility] Education information/materials will improve Outcome: Progressing Goal: Emotional status will improve Outcome: Progressing Goal: Mental status will improve Outcome: Progressing Goal: Verbalization of understanding the information provided will improve Outcome: Progressing   Problem: Activity: Goal: Interest or engagement in activities will improve Outcome: Progressing Goal: Sleeping patterns will improve Outcome: Progressing   Problem: Coping: Goal: Ability to verbalize frustrations and anger appropriately will improve Outcome: Progressing Goal: Ability to demonstrate self-control will improve Outcome: Progressing   Problem: Health Behavior/Discharge Planning: Goal: Identification of resources available to assist in meeting health care needs will improve Outcome: Progressing Goal: Compliance with treatment plan for underlying cause of condition will improve Outcome: Progressing   Problem: Physical Regulation: Goal: Ability to maintain clinical measurements within normal limits will improve Outcome: Progressing   Problem: Safety: Goal: Periods of time without injury will increase Outcome: Progressing   Problem: Education: Goal: Utilization of techniques to improve thought processes will improve Outcome: Progressing Goal: Knowledge of the prescribed therapeutic regimen will improve Outcome: Progressing   Problem: Activity: Goal: Interest or engagement in leisure activities will improve Outcome: Progressing Goal: Imbalance in normal sleep/wake cycle will improve Outcome: Progressing   Problem: Coping: Goal: Coping ability will improve Outcome: Progressing Goal: Will verbalize feelings Outcome: Progressing   Problem: Health Behavior/Discharge Planning: Goal: Ability to make decisions will improve Outcome: Progressing Goal: Compliance with therapeutic regimen will improve Outcome: Progressing    Problem: Role Relationship: Goal: Will demonstrate positive changes in social behaviors and relationships Outcome: Progressing   Problem: Safety: Goal: Ability to disclose and discuss suicidal ideas will improve Outcome: Progressing Goal: Ability to identify and utilize support systems that promote safety will improve Outcome: Progressing   Problem: Self-Concept: Goal: Will verbalize positive feelings about self Outcome: Progressing Goal: Level of anxiety will decrease Outcome: Progressing   Problem: Education: Goal: Ability to make informed decisions regarding treatment will improve Outcome: Progressing   Problem: Coping: Goal: Coping ability will improve Outcome: Progressing   Problem: Health Behavior/Discharge Planning: Goal: Identification of resources available to assist in meeting health care needs will improve Outcome: Progressing   Problem: Medication: Goal: Compliance with prescribed medication regimen will improve Outcome: Progressing   Problem: Self-Concept: Goal: Ability to disclose and discuss suicidal ideas will improve Outcome: Progressing Goal: Will verbalize positive feelings about self Outcome: Progressing   Problem: Activity: Goal: Will verbalize the importance of balancing activity with adequate rest periods Outcome: Progressing   Problem: Education: Goal: Will be free of psychotic symptoms Outcome: Progressing Goal: Knowledge of the prescribed therapeutic regimen will improve Outcome: Progressing   Problem: Coping: Goal: Coping ability will improve Outcome: Progressing Goal: Will verbalize feelings Outcome: Progressing   Problem: Health Behavior/Discharge Planning: Goal: Compliance with prescribed medication regimen will improve Outcome: Progressing   Problem: Nutritional: Goal: Ability to achieve adequate nutritional intake will improve Outcome: Progressing   Problem: Role Relationship: Goal: Ability to communicate needs  accurately will improve Outcome: Progressing Goal: Ability to interact with others will improve Outcome: Progressing   Problem: Safety: Goal: Ability to redirect hostility and anger into socially appropriate behaviors will improve Outcome: Progressing Goal: Ability to remain free from injury will improve Outcome: Progressing   Problem: Self-Care: Goal: Ability to participate in self-care as condition permits will improve Outcome: Progressing   Problem: Self-Concept: Goal: Will verbalize positive feelings about self Outcome: Progressing

## 2022-04-27 NOTE — Progress Notes (Signed)
   Pt declined labs this morning stating, "she can't wake herself up to get it done."  Writer requested lab reschedule.

## 2022-04-27 NOTE — Progress Notes (Addendum)
[facility] MD Progress Note  04/27/2022 1:45 PM [name]  MRN:  [id number] Subjective:  [name] is a 51 year old female with a psychiatric history of schizoaffective disorder-bipolar type who presented voluntarily from [facility] and admitted for disorganized thoughts, SI, and paranoia.  Yesterday's recommendations per psychiatric team: - Increase to Zyprexa 20 mg nightly - Continue home Depakote ER 1000 mg nightly  On evaluation today: Patient is seen in the day room, and remains disorganized and hyperverbal, but she is less pressured in her speech and more easily interruptible.  Patient reports sleeping well and intact appetite.  She does endorse some dizziness upon standing, but this is chronic.  She denies any acute concerns or complaints today.  Patient denies SI, HI (states "not finding anyone"), AVH, paranoia (states "no conspiracy thinking"), and ideas of reference.  She denies thought insertion/withdrawal, thought broadcasting, and receiving messages through electronics.  She reports having bowel movement today. She is ruminative about concerns that she is on steroids to make her female and that people feel her voice sounds female.   Collateral: [name] [phone]. Mali Lyles, RN- Can have medications faxed. Last Autumn Messing on June 23. Has been taking other oral medications but has been making a habit of going to facilities and leaving AMA. She is disorganized and has paranoia at baseline. Fixation on transgenderism. No SI mentioned to him. Patient has not been on Clozaril; recommendation to continue was declined by [name] physician at the time when patient discharged in December 2022. However, the team has a new physician who Mali is sure would be on board with restarting and managing the medication if needed.   Principal Problem: Schizoaffective disorder, bipolar type ([place]) Diagnosis: Principal Problem:   Schizoaffective disorder, bipolar type ([place]) Active Problems:   Suicidal  ideations   Total Time spent with patient: 30 minutes  Past Psychiatric History:  Previous Psych Diagnoses: Schizoaffective- BP type Prior inpatient treatment: [facility] 02/2022; [facility] 09/27/19, 07/2021; July 2020 [place] Current/prior outpatient treatment: [name]  Prior rehab hx: Denies Psychotherapy hx: Denies History of suicide: Reports suicide attempt in college by cutting wrists History of homicide: Denies Psychiatric medication history: Prev trials- Depakote ER, Risperdal (helped to calm her), Abilify PO and LAI, PO paliperidone while titrating LAI; haldol (allergies), prolixin, invega , zyprexa, seroquel, Clozaril (unsure of compliance with regimen) Neuromodulation history: ECT in 1993 Current Psychiatrist: [place] Current therapist: Denies  Past Medical History:  Past Medical History:  Diagnosis Date   Anxiety    Asthma    Bipolar 1 disorder ([place])    Depression    Gallstones 08/2020   Hypertension    Insomnia, persistent    Prediabetes    Schizophrenic disorder ([place])    Seizures ([place])     Past Surgical History:  Procedure Laterality Date   BREAST LUMPECTOMY Left 07/2013   TONSILLECTOMY     TUBAL LIGATION  2002   Family History:  Family History  Problem Relation Age of Onset   Depression Mother    Gout Mother    Cancer Father        prostate   Other Father        lung issue   Alcoholism Other    Heart attack Paternal Grandfather    Heart attack Paternal Grandmother    Heart attack Maternal Grandmother    Heart attack Maternal Grandfather    Depression Son    Anxiety disorder Son    Family Psychiatric  History:  Psych: Mother  and maternal grandmother with schizophrenia, dad schizoaffective disorder depressive type Psych Rx: Unknown SA/HA: Uncle completed suicide Substance use family hx: Denies Social History:  Social History   Substance and Sexual Activity  Alcohol Use Not Currently     Social History   Substance and Sexual Activity   Drug Use No    Social History   Socioeconomic History   Marital status: Single    Spouse name: Not on file   Number of children: Not on file   Years of education: Not on file   Highest education level: Not on file  Occupational History   Not on file  Tobacco Use   Smoking status: Former    Types: Cigarettes   Smokeless tobacco: Never  Vaping Use   Vaping Use: Never used  Substance and Sexual Activity   Alcohol use: Not Currently   Drug use: No   Sexual activity: Not Currently  Other Topics Concern   Not on file  Social History Narrative   Not on file   Social Determinants of Health   Financial Resource Strain: Not on file  Food Insecurity: Food Insecurity Present (09/16/2020)   Hunger Vital Sign    Worried About Running Out of Food in the Last Year: Sometimes true    Ran Out of Food in the Last Year: Sometimes true  Transportation Needs: No Transportation Needs (09/16/2020)   PRAPARE - Hydrologist (Medical): No    Lack of Transportation (Non-Medical): No  Physical Activity: Not on file  Stress: Not on file  Social Connections: Not on file   Appetite:  Good  Current Medications: Current Facility-Administered Medications  Medication Dose Route Frequency Provider Last Rate Last Admin   acetaminophen (TYLENOL) tablet 650 mg  650 mg Oral Q6H PRN [name], NP       albuterol (VENTOLIN HFA) 108 (90 Base) MCG/ACT inhaler 2 puff  2 puff Inhalation QID PRN [name], NP       alum & mag hydroxide-simeth (MAALOX/MYLANTA) 200-200-20 MG/5ML suspension 30 mL  30 mL Oral Q4H PRN [name], NP       ascorbic acid (VITAMIN C) tablet 500 mg  500 mg Oral BID [name], NP   500 mg at 04/27/22 0842   atorvastatin (LIPITOR) tablet 10 mg  10 mg Oral Daily [name], NP   10 mg at 04/27/22 0842   divalproex (DEPAKOTE ER) 24 hr tablet 1,000 mg  1,000 mg Oral QHS [name], NP   1,000 mg at 04/26/22 2119   docusate sodium (COLACE)  capsule 100 mg  100 mg Oral Daily [name], NP   100 mg at 04/27/22 8299   hydrOXYzine (ATARAX) tablet 25 mg  25 mg Oral TID PRN Harlow Asa, MD       OLANZapine zydis (ZYPREXA) disintegrating tablet 5 mg  5 mg Oral Q8H PRN Harlow Asa, MD       And   LORazepam (ATIVAN) tablet 1 mg  1 mg Oral PRN Harlow Asa, MD       And   ziprasidone (GEODON) injection 20 mg  20 mg Intramuscular PRN Nelda Marseille, Mieke Brinley E, MD       magnesium hydroxide (MILK OF MAGNESIA) suspension 30 mL  30 mL Oral Daily PRN [name], NP       melatonin tablet 3 mg  3 mg Oral QHS [name], NP   3 mg at 04/26/22 2119   metFORMIN (GLUCOPHAGE) tablet  500 mg  500 mg Oral Q breakfast [name], NP   500 mg at 04/27/22 0842   OLANZapine (ZYPREXA) tablet 20 mg  20 mg Oral QHS Rosezetta Schlatter, MD   20 mg at 04/26/22 2119   traZODone (DESYREL) tablet 50 mg  50 mg Oral QHS PRN Harlow Asa, MD        Lab Results:  Results for orders placed or performed during the hospital encounter of 04/25/22 (from the past 48 hour(s))  Resp Panel by RT-PCR (Flu A&B, Covid) Anterior Nasal Swab     Status: None   Collection Time: 04/25/22  2:20 PM   Specimen: Anterior Nasal Swab  Result Value Ref Range   SARS Coronavirus 2 by RT PCR NEGATIVE NEGATIVE    Comment: (NOTE) SARS-CoV-2 target nucleic acids are NOT DETECTED.  The SARS-CoV-2 RNA is generally detectable in upper respiratory specimens during the acute phase of infection. The lowest concentration of SARS-CoV-2 viral copies this assay can detect is 138 copies/mL. A negative result does not preclude SARS-Cov-2 infection and should not be used as the sole basis for treatment or other patient management decisions. A negative result may occur with  improper specimen collection/handling, submission of specimen other than nasopharyngeal swab, presence of viral mutation(s) within the areas targeted by this assay, and inadequate number of viral copies(<138  copies/mL). A negative result must be combined with clinical observations, patient history, and epidemiological information. The expected result is Negative.  Fact Sheet for Patients:  EntrepreneurPulse.com.au  Fact Sheet for Healthcare Providers:  IncredibleEmployment.be  This test is no t yet approved or cleared by the [place] FDA and  has been authorized for detection and/or diagnosis of SARS-CoV-2 by FDA under an Emergency Use Authorization (EUA). This EUA will remain  in effect (meaning this test can be used) for the duration of the COVID-19 declaration under Section 564(b)(1) of the Act, 21 U.S.C.section 360bbb-3(b)(1), unless the authorization is terminated  or revoked sooner.       Influenza A by PCR NEGATIVE NEGATIVE   Influenza B by PCR NEGATIVE NEGATIVE    Comment: (NOTE) The Xpert Xpress SARS-CoV-2/FLU/RSV plus assay is intended as an aid in the diagnosis of influenza from Nasopharyngeal swab specimens and should not be used as a sole basis for treatment. Nasal washings and aspirates are unacceptable for Xpert Xpress SARS-CoV-2/FLU/RSV testing.  Fact Sheet for Patients: EntrepreneurPulse.com.au  Fact Sheet for Healthcare Providers: IncredibleEmployment.be  This test is not yet approved or cleared by the [place] FDA and has been authorized for detection and/or diagnosis of SARS-CoV-2 by FDA under an Emergency Use Authorization (EUA). This EUA will remain in effect (meaning this test can be used) for the duration of the COVID-19 declaration under Section 564(b)(1) of the Act, 21 U.S.C. section 360bbb-3(b)(1), unless the authorization is terminated or revoked.  Performed at [facility], [place] [address] [place], [place] [address]     Blood Alcohol level:  Lab Results  Component Value Date   [facility] <10 04/25/2022   ETH <10 35/32/9924    Metabolic Disorder  Labs: Lab Results  Component Value Date   HGBA1C 5.8 (H) 08/12/2021   MPG 119.76 08/12/2021   MPG 125.5 04/27/2021   Lab Results  Component Value Date   PROLACTIN 74.6 (H) 09/28/2019   PROLACTIN 4.2 (L) 12/19/2018   Lab Results  Component Value Date   CHOL 125 08/16/2021   TRIG 54 08/16/2021   HDL 40 (L) 08/16/2021  CHOLHDL 3.1 08/16/2021   VLDL 11 08/16/2021   LDLCALC 74 08/16/2021   LDLCALC 95 08/12/2021    Physical Findings: AIMS:  Facial and Oral Movements Muscles of Facial Expression: None, normal Lips and Perioral Area: None, normal Jaw: None, normal Tongue: None, normal, Extremity Movements Upper (arms, wrists, hands, fingers): None, normal Lower (legs, knees, ankles, toes): None, normal,  Trunk Movements Neck, shoulders, hips: None, normal,  Overall Severity Severity of abnormal movements (highest score from questions above): None, normal Incapacitation due to abnormal movements: None, normal [name] awareness of abnormal movements (rate only patient's report): No Awareness,  Dental Status Current problems with teeth and/or dentures?: No Does patient usually wear dentures?: No    Musculoskeletal: Strength & Muscle Tone: within normal limits Gait & Station:  Not observed by this Probation officer Patient leans: N/A  Psychiatric Specialty Exam:  Presentation  General Appearance: Appropriate for Environment; Casual; Fairly Groomed   Eye Contact:Good   Speech:hyper-verbal but not pressured, mumbling quality at times   Speech Volume:Normal   Handedness:Right    Mood and Affect  Mood:anxious appearing   Affect: guarded, anxious    Thought Process  Thought Processes:Disorganized   Descriptions of Associations:Tangential   Orientation:Full (Time, Place and Person)   Thought Content:Ruminations about concern she is perceived as female; denies paranoia, ideas of reference, first rank symptoms, AVH, SI or HI - is paranoid on exam but is not  grossly responding to internal stimuli   History of Schizophrenia/Schizoaffective disorder:Yes   Duration of Psychotic Symptoms:Greater than six months   Hallucinations:Hallucinations: None  Ideas of Reference:None   Suicidal Thoughts:Suicidal Thoughts: No  Homicidal Thoughts:Homicidal Thoughts: No   Sensorium  Memory:Immediate Fair; Recent Fair   Judgment:Impaired   Insight:Poor    Executive Functions  Concentration:Fair   Attention Span:Fair   [place]    Psychomotor Activity  Psychomotor Activity:Psychomotor Activity: Normal   Assets  Assets:Desire for Improvement; Financial Resources/Insurance; Housing; Social Support    Sleep  Sleep:Sleep: Fair Number of Hours of Sleep: 7    Physical Exam: Physical Exam Vitals reviewed.  Constitutional:      General: She is not in acute distress.    Appearance: She is not toxic-appearing.  HENT:     Head: Normocephalic and atraumatic.     Mouth/Throat:     Mouth: Mucous membranes are moist.     Pharynx: Oropharynx is clear.  Pulmonary:     Effort: Pulmonary effort is normal.  Neurological:     General: No focal deficit present.     Mental Status: She is alert and oriented to person, place, and time.    Review of Systems  Respiratory:  Negative for shortness of breath.   Cardiovascular:  Negative for chest pain.  Gastrointestinal: Negative.   Genitourinary: Negative.   Musculoskeletal:        Patient believes she is a fall risk  Neurological:  Positive for dizziness. Negative for tremors and headaches.   Blood pressure 104/62, pulse 83, temperature 98.3 F (36.8 C), temperature source Oral, resp. rate 18, height '5\' 6"'$  (1.676 m), weight 104.1 kg, SpO2 100 %. Body mass index is 37.03 kg/m.   Treatment Plan Summary: ASSESSMENT: Principal Problem:   Schizoaffective disorder, bipolar type ([place]) Active Problems:   Suicidal ideations      PLAN: Safety and Monitoring:             -- Voluntary admission to inpatient psychiatric unit for safety, stabilization and  treatment             -- Daily contact with patient to assess and evaluate symptoms and progress in treatment             -- [name] case to be discussed in multi-disciplinary team meeting             -- Observation Level : q15 minute checks             -- Vital signs:  q12 hours             -- Precautions: suicide, elopement, and assault   2. Psychiatric Diagnoses and Treatment:  # Schizoaffective disorder, bipolar type #Suicidal ideation without plan - Continue Zyprexa 20 mg nightly -- Loaded with [name] per [name] on 02/17/22 - Continue home Depakote ER 1000 mg nightly.  Will consider titrating pending labs.             Trough level pending, as well as CBC with differential; CMP with LFTs WNL.  CBC with chronically normocytic anemia-hemoglobin 11.2, MCV 90.3, platelets 234, WBC 5.3   3. Medical Diagnoses and Treatment: #Prediabetes #History of dyslipidemia Repeat A1c and lipid panel pending - Continue metformin 500 mg once daily for prediabetes and prevention of antipsychotic induced metabolic syndrome - Continue home atorvastatin 10 mg daily  # History of asthma - Rescue inhaler available 4 times daily as needed  #Anemia -- appears chronic - needs f/u with PCP after discharge   4. Discharge Planning:              -- Social work and case management to assist with discharge planning and identification of hospital follow-up needs prior to discharge             -- Estimated LOS 5-7 days             -- Discharge Concerns: Need to establish a safety plan             -- Discharge Goals: Return home with outpatient referrals for mental health follow-up including medication management/psychotherapy    Rosezetta Schlatter, MD 04/27/2022, 1:45 PM

## 2022-04-27 NOTE — [facility] Group Notes (Signed)
Adult Psychoeducational Group Note  Date:  04/27/2022 Time:  10:24 AM  Group Topic/Focus:  Goals Group:   The focus of this group is to help patients establish daily goals to achieve during treatment and discuss how the patient can incorporate goal setting into their daily lives to aide in recovery.  Participation Level:  Active  Participation Quality:  Attentive  Affect:  Appropriate  Cognitive:  Appropriate  Insight: Appropriate  Engagement in Group:  Engaged  Modes of Intervention:  Discussion  Additional Comments:  Patient attended goals group and was attentive the duration of it. [name] goal was to attend all groups and take her medication.   Myrical Andujo T Saachi Zale 04/27/2022, 10:24 AM

## 2022-04-27 NOTE — Progress Notes (Signed)
Patient appears pleasant. Patient denies SI/HI/AVH. Pt appears to be responding to internal stimuli. During morning med pass she leaned over to the side and began whispering something and did this repeatedly. Pt thinking is tangential. Pt was requesting a FL-2 to be done for her by the provider. Patient complied with morning medication with no reported side effects. Patient remains safe on Q30mn checks and contracts for safety.      04/27/22 1058  Psych Admission Type (Psych Patients Only)  Admission Status Voluntary  Psychosocial Assessment  Patient Complaints Worrying  Eye Contact Fair  Facial Expression Flat  Affect Anxious;Flat  Speech Tangential  Interaction Assertive  Motor Activity Slow  Appearance/Hygiene Unremarkable  Behavior Characteristics Cooperative;Calm  Mood Pleasant;Depressed  Thought Process  Coherency Disorganized;Tangential  Content Preoccupation  Delusions Paranoid  Perception WDL  Hallucination None reported or observed  Judgment Poor  Confusion Mild  Danger to Self  Current suicidal ideation? Denies  Self-Injurious Behavior No self-injurious ideation or behavior indicators observed or expressed   Agreement Not to Harm Self Yes  Description of Agreement verbal  Danger to Others  Danger to Others None reported or observed

## 2022-04-27 NOTE — [facility] Group Notes (Signed)
Scale 1-10 5 out of 10 Goal: Participate in group

## 2022-04-28 DIAGNOSIS — F250 Schizoaffective disorder, bipolar type: Secondary | ICD-10-CM | POA: Diagnosis not present

## 2022-04-28 LAB — BASIC METABOLIC PANEL
Anion gap: 7 (ref 5–15)
BUN: 7 mg/dL (ref 6–20)
CO2: 27 mmol/L (ref 22–32)
Calcium: 9.8 mg/dL (ref 8.9–10.3)
Chloride: 108 mmol/L (ref 98–111)
Creatinine, Ser: 0.75 mg/dL (ref 0.44–1.00)
GFR, Estimated: >60 mL/min (ref >60)
Glucose, Bld: 116 mg/dL — ABNORMAL HIGH (ref 70–99)
Potassium: 3.7 mmol/L (ref 3.5–5.1)
Sodium: 142 mmol/L (ref 135–145)

## 2022-04-28 LAB — C-REACTIVE PROTEIN: CRP: 1 mg/dL — ABNORMAL HIGH (ref <1.0)

## 2022-04-28 MED ORDER — CLOZAPINE 25 MG PO TABS
12.5000 mg | ORAL_TABLET | Freq: Every day | ORAL | Status: AC
Start: 2022-04-29 — End: 2022-04-29
  Administered 2022-04-29: 12.5 mg via ORAL
  Filled 2022-04-28: qty 1

## 2022-04-28 MED ORDER — CLOZAPINE 25 MG PO TABS
25.0000 mg | ORAL_TABLET | Freq: Every day | ORAL | Status: DC
Start: 2022-04-30 — End: 2022-05-01
  Filled 2022-04-28: qty 1

## 2022-04-28 NOTE — Progress Notes (Signed)
     04/28/22 2038  Psych Admission Type (Psych Patients Only)  Admission Status Voluntary  Psychosocial Assessment  Patient Complaints Anxiety;Worrying  Eye Contact Fair  Facial Expression Flat  Affect Flat;Anxious  Speech Tangential  Interaction Guarded;Forwards little  Motor Activity Slow  Appearance/Hygiene Unremarkable  Behavior Characteristics Cooperative;Calm  Mood Pleasant;Depressed  Thought Process  Coherency Disorganized;Tangential  Content Preoccupation  Delusions Paranoid  Perception WDL  Hallucination None reported or observed  Judgment Poor  Confusion Mild  Danger to Self  Current suicidal ideation? Denies  Self-Injurious Behavior No self-injurious ideation or behavior indicators observed or expressed   Agreement Not to Harm Self Yes  Description of Agreement verbal  Danger to Others  Danger to Others None reported or observed

## 2022-04-28 NOTE — Plan of Care (Signed)
?  Problem: Activity: ?Goal: Interest or engagement in activities will improve ?Outcome: Progressing ?Goal: Sleeping patterns will improve ?Outcome: Progressing ?  ?Problem: Coping: ?Goal: Ability to verbalize frustrations and anger appropriately will improve ?Outcome: Progressing ?Goal: Ability to demonstrate self-control will improve ?Outcome: Progressing ?  ?Problem: Safety: ?Goal: Periods of time without injury will increase ?Outcome: Progressing ?  ?

## 2022-04-28 NOTE — Progress Notes (Signed)
Adult Psychoeducational Group Note  Date:  04/28/2022 Time:  8:39 PM  Group Topic/Focus:  Wrap-Up Group:   The focus of this group is to help patients review their daily goal of treatment and discuss progress on daily workbooks.  Participation Level:  Active  Participation Quality:  Appropriate  Affect:  Appropriate  Cognitive:  Oriented  Insight: Limited  Engagement in Group:  Engaged  Modes of Intervention:  Education and Exploration  Additional Comments:  Patient attended and participated in group tonight. She reports that while she has been here she learn patience  and the importance of following up with doctor's appointment.  Salley Scarlet [facility] 04/28/2022, 8:39 PM

## 2022-04-28 NOTE — Progress Notes (Addendum)
[facility] MD Progress Note  04/28/2022 7:37 AM [name]  MRN:  [id number] Subjective:  [name] is a 51 year old female with a psychiatric history of schizoaffective disorder-bipolar type who presented voluntarily from [facility] and admitted for disorganized thoughts, SI, and paranoia.  On evaluation today:  Patient was seen and assessed in bedroom.  Patient remains disorganized and hyperverbal but appears less pressured and more easily redirectable.  Patient reports sleeping well and eating well.  Patient continues to be tangential.  Patient had signed her 72-hour form yesterday as she had felt initially unsafe due to another patient on the unit being very aggressive.  However, patient was willing to rescind the form in order to continue medication adjustments.  Patient reports that her ACT team "do not know any better" and is paranoid that "they are doing trials on me".  Patient has clang associations throughout conversation.  Patient endorsed some diarrhea but unable to clarify the degree or what extent.  Patient reports that she did not want Imodium as that would cause "$40,000 a sip so I would rather take it at home".  Patient is AxOx4.  Patient denies present SI/HI/AVH.  Patient was amenable to starting Clozaril tomorrow after discussion that her psychosis may not be well controlled at this time.  Patient amenable to lab draws tonight to confirm that patient would be appropriate to start Clozaril.   Principal Problem: Schizoaffective disorder, bipolar type ([place]) Diagnosis: Principal Problem:   Schizoaffective disorder, bipolar type ([place]) Active Problems:   Suicidal ideations   Total Time spent with patient: 30 minutes  Past Psychiatric History:  Previous Psych Diagnoses: Schizoaffective- BP type Prior inpatient treatment: [facility] 02/2022; [facility] 09/27/19, 07/2021; July 2020 [place] Current/prior outpatient treatment: [name]  Prior rehab hx: Denies Psychotherapy hx: Denies History  of suicide: Reports suicide attempt in college by cutting wrists History of homicide: Denies Psychiatric medication history: Prev trials- Depakote ER, Risperdal (helped to calm her), Abilify PO and LAI, PO paliperidone while titrating LAI; haldol (allergies), prolixin, invega , zyprexa, seroquel, Clozaril (unsure of compliance with regimen) Neuromodulation history: ECT in 1993 Current Psychiatrist: [place] Current therapist: Denies  Past Medical History:  Past Medical History:  Diagnosis Date   Anxiety    Asthma    Bipolar 1 disorder ([place])    Depression    Gallstones 08/2020   Hypertension    Insomnia, persistent    Prediabetes    Schizophrenic disorder ([place])    Seizures ([place])     Past Surgical History:  Procedure Laterality Date   BREAST LUMPECTOMY Left 07/2013   TONSILLECTOMY     TUBAL LIGATION  2002   Family History:  Family History  Problem Relation Age of Onset   Depression Mother    Gout Mother    Cancer Father        prostate   Other Father        lung issue   Alcoholism Other    Heart attack Paternal Grandfather    Heart attack Paternal Grandmother    Heart attack Maternal Grandmother    Heart attack Maternal Grandfather    Depression Son    Anxiety disorder Son    Family Psychiatric  History:  Psych: Mother and maternal grandmother with schizophrenia, dad schizoaffective disorder depressive type Psych Rx: Unknown SA/HA: Uncle completed suicide Substance use family hx: Denies Social History:  Social History   Substance and Sexual Activity  Alcohol Use Not Currently     Social History  Substance and Sexual Activity  Drug Use No    Social History   Socioeconomic History   Marital status: Single    Spouse name: Not on file   Number of children: Not on file   Years of education: Not on file   Highest education level: Not on file  Occupational History   Not on file  Tobacco Use   Smoking status: Former    Types: Cigarettes   Smokeless  tobacco: Never  Vaping Use   Vaping Use: Never used  Substance and Sexual Activity   Alcohol use: Not Currently   Drug use: No   Sexual activity: Not Currently  Other Topics Concern   Not on file  Social History Narrative   Not on file   Social Determinants of Health   Financial Resource Strain: Not on file  Food Insecurity: Food Insecurity Present (09/16/2020)   Hunger Vital Sign    Worried About Running Out of Food in the Last Year: Sometimes true    Ran Out of Food in the Last Year: Sometimes true  Transportation Needs: No Transportation Needs (09/16/2020)   PRAPARE - Hydrologist (Medical): No    Lack of Transportation (Non-Medical): No  Physical Activity: Not on file  Stress: Not on file  Social Connections: Not on file   Appetite:  Good  Current Medications: Current Facility-Administered Medications  Medication Dose Route Frequency Provider Last Rate Last Admin   acetaminophen (TYLENOL) tablet 650 mg  650 mg Oral Q6H PRN [name], NP       albuterol (VENTOLIN HFA) 108 (90 Base) MCG/ACT inhaler 2 puff  2 puff Inhalation QID PRN [name], NP       alum & mag hydroxide-simeth (MAALOX/MYLANTA) 200-200-20 MG/5ML suspension 30 mL  30 mL Oral Q4H PRN [name], NP       ascorbic acid (VITAMIN C) tablet 500 mg  500 mg Oral BID [name], NP   500 mg at 04/27/22 1824   atorvastatin (LIPITOR) tablet 10 mg  10 mg Oral Daily [name], NP   10 mg at 04/27/22 0842   divalproex (DEPAKOTE ER) 24 hr tablet 1,000 mg  1,000 mg Oral QHS [name], NP   1,000 mg at 04/27/22 2058   docusate sodium (COLACE) capsule 100 mg  100 mg Oral Daily [name], NP   100 mg at 04/27/22 5631   hydrOXYzine (ATARAX) tablet 25 mg  25 mg Oral TID PRN Harlow Asa, MD       OLANZapine zydis (ZYPREXA) disintegrating tablet 5 mg  5 mg Oral Q8H PRN Harlow Asa, MD       And   LORazepam (ATIVAN) tablet 1 mg  1 mg Oral PRN Harlow Asa, MD        And   ziprasidone (GEODON) injection 20 mg  20 mg Intramuscular PRN Nelda Marseille, Amy E, MD       magnesium hydroxide (MILK OF MAGNESIA) suspension 30 mL  30 mL Oral Daily PRN [name], NP       melatonin tablet 3 mg  3 mg Oral QHS [name], NP   3 mg at 04/27/22 2059   metFORMIN (GLUCOPHAGE) tablet 500 mg  500 mg Oral Q breakfast [name], NP   500 mg at 04/27/22 0842   OLANZapine (ZYPREXA) tablet 20 mg  20 mg Oral QHS Rosezetta Schlatter, MD   20 mg at 04/27/22 2058   traZODone (DESYREL) tablet 50  mg  50 mg Oral QHS PRN Harlow Asa, MD        Lab Results:  Results for orders placed or performed during the hospital encounter of 04/26/22 (from the past 48 hour(s))  CBC with Differential/Platelet     Status: None   Collection Time: 04/27/22  6:13 PM  Result Value Ref Range   WBC 5.9 4.0 - 10.5 K/uL   RBC 4.16 3.87 - 5.11 MIL/uL   Hemoglobin 12.1 12.0 - 15.0 g/dL   HCT 38.0 36.0 - 46.0 %   MCV 91.3 80.0 - 100.0 fL   MCH 29.1 26.0 - 34.0 pg   MCHC 31.8 30.0 - 36.0 g/dL   RDW 13.8 11.5 - 15.5 %   Platelets 257 150 - 400 K/uL   nRBC 0.0 0.0 - 0.2 %   Neutrophils Relative % 45 %   Neutro Abs 2.7 1.7 - 7.7 K/uL   Lymphocytes Relative 44 %   Lymphs Abs 2.6 0.7 - 4.0 K/uL   Monocytes Relative 6 %   Monocytes Absolute 0.4 0.1 - 1.0 K/uL   Eosinophils Relative 4 %   Eosinophils Absolute 0.3 0.0 - 0.5 K/uL   Basophils Relative 1 %   Basophils Absolute 0.0 0.0 - 0.1 K/uL   Immature Granulocytes 0 %   Abs Immature Granulocytes 0.01 0.00 - 0.07 K/uL    Comment: Performed at [facility], [place] [address], [place], [place] [address]  Lipid panel     Status: None   Collection Time: 04/27/22  6:13 PM  Result Value Ref Range   Cholesterol 150 0 - 200 mg/dL   Triglycerides 66 <150 mg/dL   HDL 41 >40 mg/dL   Total CHOL/HDL Ratio 3.7 RATIO   VLDL 13 0 - 40 mg/dL   LDL Cholesterol 96 0 - 99 mg/dL    Comment:        Total Cholesterol/HDL:CHD Risk Coronary  Heart Disease Risk Table                     Men   Women  1/2 Average Risk   3.4   3.3  Average Risk       5.0   4.4  2 X Average Risk   9.6   7.1  3 X Average Risk  23.4   11.0        Use the calculated Patient Ratio above and the CHD Risk Table to determine the patient's CHD Risk.        ATP III CLASSIFICATION (LDL):  <100     mg/dL   Optimal  100-129  mg/dL   Near or Above                    Optimal  130-159  mg/dL   Borderline  160-189  mg/dL   High  >190     mg/dL   Very High Performed at [facility] [address], [place], [place] [address]   Valproic acid level     Status: None   Collection Time: 04/27/22  6:13 PM  Result Value Ref Range   Valproic Acid Lvl 62 50.0 - 100.0 ug/mL    Comment: Performed at [facility], [place] [address] [place], [place] [address]  Hemoglobin A1c     Status: None   Collection Time: 04/27/22  6:13 PM  Result Value Ref Range   Hgb A1c MFr Bld 5.5 4.8 - 5.6 %  Comment: (NOTE) Pre diabetes:          5.7%-6.4%  Diabetes:              >6.4%  Glycemic control for   <7.0% adults with diabetes    Mean Plasma Glucose 111.15 mg/dL    Comment: Performed at [facility] [address], [place], [place] [address]  TSH     Status: None   Collection Time: 04/27/22  6:13 PM  Result Value Ref Range   TSH 0.806 0.350 - 4.500 uIU/mL    Comment: Performed by a 3rd Generation assay with a functional sensitivity of <=0.01 uIU/mL. Performed at [facility], [place] [address], [place], [place] [address]     Blood Alcohol level:  Lab Results  Component Value Date   [facility] <10 04/25/2022   ETH <10 77/06/6578    Metabolic Disorder Labs: Lab Results  Component Value Date   HGBA1C 5.5 04/27/2022   MPG 111.15 04/27/2022   MPG 119.76 08/12/2021   Lab Results  Component Value Date   PROLACTIN 74.6 (H) 09/28/2019   PROLACTIN 4.2 (L) 12/19/2018   Lab Results  Component Value Date    CHOL 150 04/27/2022   TRIG 66 04/27/2022   HDL 41 04/27/2022   CHOLHDL 3.7 04/27/2022   VLDL 13 04/27/2022   LDLCALC 96 04/27/2022   LDLCALC 74 08/16/2021    Physical Findings:  Musculoskeletal: Strength & Muscle Tone: within normal limits Gait & Station:  Not observed by this Probation officer Patient leans: N/A  Psychiatric Specialty Exam:  Presentation  General Appearance: Appropriate for Environment; Casual; Fairly Groomed   Eye Contact:Good   Speech:hyper-verbal but not pressured, mumbling quality at times   Speech Volume:Normal   Handedness:Right    Mood and Affect  Mood:anxious appearing   Affect: guarded, anxious    Thought Process  Thought Processes:Disorganized   Descriptions of Associations:Tangential   Orientation:Full (Time, Place and Person)   Thought Content:Ruminations about discharge; paranoid that ACT team is performing experiments on her and that she is "just a lab rat they get injectables" (states that there is a prominent difference between injectable and injection). Denies ideas of reference, first rank symptoms, AVH, SI or HI - is paranoid on exam but is not grossly responding to internal stimuli   History of Schizophrenia/Schizoaffective disorder:Yes   Duration of Psychotic Symptoms:Greater than six months   Hallucinations:Hallucinations: None  Ideas of Reference:None   Suicidal Thoughts:Suicidal Thoughts: No  Homicidal Thoughts:Homicidal Thoughts: No   Sensorium  Memory:Immediate Fair; Recent Fair   Judgment:Impaired   Insight:Poor    Executive Functions  Concentration:Fair   Attention Span:Fair   [place]    Psychomotor Activity  Psychomotor Activity:Psychomotor Activity: Normal   Assets  Assets:Desire for Improvement; Financial Resources/Insurance; Housing; Social Support    Sleep  Sleep:Sleep: Fair Number of Hours of Sleep: 7    Physical  Exam: Physical Exam Vitals reviewed.  Constitutional:      General: She is not in acute distress.    Appearance: She is not toxic-appearing.  HENT:     Head: Normocephalic and atraumatic.     Mouth/Throat:     Mouth: Mucous membranes are moist.     Pharynx: Oropharynx is clear.  Pulmonary:     Effort: Pulmonary effort is normal.  Neurological:     General: No focal deficit present.     Mental Status: She is alert and oriented to person,  place, and time.   Review of Systems  Respiratory:  Negative for shortness of breath.   Cardiovascular:  Negative for chest pain.  Gastrointestinal: Negative.   Genitourinary: Negative.   Musculoskeletal:        Patient believes she is a fall risk  Neurological:  Positive for dizziness. Negative for tremors and headaches.   Blood pressure 126/81, pulse 75, temperature (!) 97.5 F (36.4 C), temperature source Oral, resp. rate 18, height '5\' 6"'$  (1.676 m), weight 104.1 kg, SpO2 100 %. Body mass index is 37.03 kg/m.   Treatment Plan Summary: ASSESSMENT: Principal Problem:   Schizoaffective disorder, bipolar type ([place]) Active Problems:   Suicidal ideations     PLAN: Safety and Monitoring:             -- Voluntary admission to inpatient psychiatric unit for safety, stabilization and treatment             -- Daily contact with patient to assess and evaluate symptoms and progress in treatment             -- [name] case to be discussed in multi-disciplinary team meeting             -- Observation Level : q15 minute checks             -- Vital signs:  q12 hours             -- Precautions: suicide, elopement, and assault   2. Psychiatric Diagnoses and Treatment:  # Schizoaffective disorder, bipolar type #Suicidal ideation without plan - Continue Zyprexa 20 mg nightly -- Loaded with [name] per [name] on 02/17/22 - Continue home Depakote ER 1000 mg nightly.  Will consider titrating pending labs.             Trough level 62, CBC wnl; CMP  with LFTs WNL.  CBC with chronically normocytic anemia-hemoglobin 11.2, MCV 90.3, platelets 234, WBC 5.3 --START Clozaril 12.5 mg qhs starting 04/29/22 (assuming CRP, troponin, and bmp appropriate) with plan to increase to 25 mg qhs on 04/30/22  -Patient informed of r/b/se and amenable to medication trial  -04/27/22 ANC 2,700. 04/26/22 ECG unchanged.   3. Medical Diagnoses and Treatment: #Prediabetes #History of dyslipidemia Repeat A1c and lipid panel pending - Continue metformin 500 mg once daily for prediabetes and prevention of antipsychotic induced metabolic syndrome - Continue home atorvastatin 10 mg daily  # History of asthma - Rescue inhaler available 4 times daily as needed  #Anemia -- appears chronic - needs f/u with PCP after discharge   4. Discharge Planning:              -- Social work and case management to assist with discharge planning and identification of hospital follow-up needs prior to discharge             -- Estimated LOS 5-7 days             -- Discharge Concerns: Need to establish a safety plan             -- Discharge Goals: Return home with outpatient referrals for mental health follow-up including medication management/psychotherapy    France Ravens, MD 04/28/2022, 7:37 AM

## 2022-04-28 NOTE — Progress Notes (Signed)
     04/27/22 2058  Psych Admission Type (Psych Patients Only)  Admission Status Voluntary  Psychosocial Assessment  Patient Complaints Anxiety;Worrying  Eye Contact Fair  Facial Expression Flat  Affect Flat;Anxious  Speech Tangential  Interaction Assertive  Motor Activity Slow  Appearance/Hygiene Unremarkable  Behavior Characteristics Cooperative;Calm  Mood Pleasant;Depressed;Anxious  Thought Process  Coherency Disorganized;Tangential  Content Preoccupation  Delusions Paranoid  Perception WDL  Hallucination None reported or observed  Judgment Poor  Confusion Mild  Danger to Self  Current suicidal ideation? Denies  Self-Injurious Behavior No self-injurious ideation or behavior indicators observed or expressed   Agreement Not to Harm Self Yes  Description of Agreement verbal  Danger to Others  Danger to Others None reported or observed

## 2022-04-28 NOTE — Progress Notes (Signed)
   04/28/22 0800  Psych Admission Type (Psych Patients Only)  Admission Status Voluntary  Psychosocial Assessment  Patient Complaints Anxiety;Worrying  Eye Contact Fair  Facial Expression Flat  Affect Flat;Anxious  Speech Tangential  Interaction Guarded  Motor Activity Slow  Appearance/Hygiene Unremarkable  Behavior Characteristics Cooperative;Calm  Mood Pleasant;Depressed;Anxious  Thought Process  Coherency Disorganized;Tangential  Content Preoccupation  Delusions Paranoid  Perception WDL  Hallucination None reported or observed  Judgment Poor  Confusion Mild  Danger to Self  Current suicidal ideation? Denies  Self-Injurious Behavior No self-injurious ideation or behavior indicators observed or expressed   Agreement Not to Harm Self Yes  Description of Agreement Verbal  Danger to Others  Danger to Others None reported or observed

## 2022-04-28 NOTE — Group Note (Signed)
LCSW Group Therapy Note   Group Date: 04/28/2022 Start Time: 1300 End Time: 1400  Type of Therapy and Topic: Group Therapy: Anger Management   Participation Level:  Active  Description of Group: In this group, patients will learn helpful strategies and techniques to manage anger, express anger in alternative ways, change hostile attitudes, and prevent aggressive acts, such as verbal abuse and violence.This group will be process-oriented and eductional, with patients participating in exploration of their own experiences as well as giving and receiving support and challenge from other group members.  Therapeutic Goals: Patient will learn to manage anger. Patient will learn to stop violence or the threat of violence. Patient will learn to develop self control over thoughts and actions. Patient will receive support and feedback from others  Summary of Patient Progress: The Pt attended group and accepted the worksheets that were given.  The Pt shared what anger looks like for them and how they feel when they are angry. The Pt was appropriate with peers. The Pt was disorganized and would often talk about irrelevant topics.      Therapeutic Modalities: Cognitive Behavioral Therapy Solution Focused Therapy Motivational Interviewing  Darleen Crocker, [place] 04/28/2022  1:36 PM

## 2022-04-28 NOTE — Plan of Care (Signed)
  Problem: Education: Goal: Emotional status will improve Outcome: Progressing   Problem: Activity: Goal: Interest or engagement in activities will improve Outcome: Progressing   Problem: Education: Goal: Mental status will improve Outcome: Not Progressing

## 2022-04-28 NOTE — [facility] Group Notes (Signed)
Adult Psychoeducational Group Note  Date:  04/28/2022 Time:  9:36 AM  Group Topic/Focus:  Goals Group:   The focus of this group is to help patients establish daily goals to achieve during treatment and discuss how the patient can incorporate goal setting into their daily lives to aide in recovery.  Participation Level:  Active  Participation Quality:  Appropriate  Affect:  Appropriate  Cognitive:  Appropriate  Insight: Appropriate  Engagement in Group:  Engaged  Modes of Intervention:  Discussion  Additional Comments:  Patient goal for today is to speak to the Social Worker assigned to her.   Garry Nicolini W Jaiyah Beining 04/02/7618, 9:36 AM

## 2022-04-28 NOTE — Plan of Care (Signed)
  Problem: Education: Goal: Emotional status will improve Outcome: Progressing Goal: Mental status will improve Outcome: Progressing   Problem: Coping: Goal: Ability to verbalize frustrations and anger appropriately will improve Outcome: Progressing

## 2022-04-29 DIAGNOSIS — F250 Schizoaffective disorder, bipolar type: Secondary | ICD-10-CM | POA: Diagnosis not present

## 2022-04-29 LAB — TROPONIN I (HIGH SENSITIVITY): Troponin I (High Sensitivity): 2 ng/L (ref <18)

## 2022-04-29 MED ORDER — OLANZAPINE 7.5 MG PO TABS
15.0000 mg | ORAL_TABLET | Freq: Every day | ORAL | Status: DC
  Administered 2022-04-29: 15 mg via ORAL
  Filled 2022-04-29 (×4): qty 2

## 2022-04-29 MED ORDER — KATE FARMS STANDARD 1.4 PO LIQD: 325.0000 mL | Freq: Two times a day (BID) | ORAL | Status: DC | PRN

## 2022-04-29 MED ORDER — KATE FARMS STANDARD 1.4 PO LIQD
325.0000 mL | Freq: Two times a day (BID) | ORAL | Status: DC
Start: 2022-04-30 — End: 2022-04-29

## 2022-04-29 NOTE — Progress Notes (Signed)
Adult Psychoeducational Group Note  Date:  04/29/2022 Time:  8:11 PM  Group Topic/Focus:  Wrap-Up Group:   The focus of this group is to help patients review their daily goal of treatment and discuss progress on daily workbooks.  Participation Level:  Did Not Attend  Participation Quality:  Did Not Attend  Affect:  Did Not Attend  Cognitive:  Did Not Attend  Insight: Did Not Attend  Engagement in Group:  Did Not Attend  Modes of Intervention:  Did Not Attend  Additional Comments:   Pt was encouraged to attend group discussion but refused   Gerhard Perches 04/29/2022, 8:11 PM

## 2022-04-29 NOTE — Progress Notes (Signed)
   04/29/22 2122  Psych Admission Type (Psych Patients Only)  Admission Status Voluntary  Psychosocial Assessment  Patient Complaints Anxiety;Worrying;Confusion  Eye Contact Fair  Facial Expression Flat  Affect Flat  Speech Tangential  Interaction Cautious  Motor Activity Slow  Appearance/Hygiene Unremarkable  Behavior Characteristics Guarded;Cooperative;Anxious  Mood Anxious;Preoccupied  Thought Process  Coherency Tangential;Disorganized  Content Delusions;Preoccupation;Paranoia  Delusions Paranoid  Perception WDL  Hallucination None reported or observed  Judgment Poor  Confusion Mild  Danger to Self  Current suicidal ideation? Denies  Danger to Others  Danger to Others None reported or observed

## 2022-04-29 NOTE — Progress Notes (Signed)
   04/29/22 1100  Charting Type  Charting Type Shift assessment  Safety Check Verification  Has the RN verified the 15 minute safety check completion? Yes  Neurological  Neuro (WDL) WDL  HEENT  HEENT (WDL) WDL  Respiratory  Respiratory (WDL) WDL  Cardiac  Cardiac (WDL) WDL  Vascular  Vascular (WDL) WDL  Integumentary  Integumentary (WDL) WDL  Braden Scale (Ages 8 and up)  Sensory Perceptions 4  Moisture 4  Activity 4  Mobility 4  Nutrition 3  Friction and Shear 3  Braden Scale Score 22  Musculoskeletal  Musculoskeletal (WDL) WDL  Assistive Device None  Gastrointestinal  Gastrointestinal (WDL) WDL  GU Assessment  Genitourinary (WDL) WDL  Neurological  Level of Consciousness Alert

## 2022-04-29 NOTE — Progress Notes (Signed)
   04/29/22 1200  Psych Admission Type (Psych Patients Only)  Admission Status Voluntary  Psychosocial Assessment  Patient Complaints Anxiety;Worrying  Eye Contact Fair  Facial Expression Flat  Affect Flat;Anxious  Speech Tangential  Interaction Guarded;Forwards little  Motor Activity Slow  Appearance/Hygiene Unremarkable  Behavior Characteristics Cooperative;Calm  Mood Pleasant;Depressed  Thought Process  Coherency Disorganized;Tangential  Content Preoccupation  Delusions Paranoid  Perception WDL  Hallucination None reported or observed  Judgment Poor  Confusion Mild  Danger to Self  Current suicidal ideation? Denies  Self-Injurious Behavior No self-injurious ideation or behavior indicators observed or expressed   Agreement Not to Harm Self Yes  Description of Agreement Verbal  Danger to Others  Danger to Others None reported or observed

## 2022-04-29 NOTE — Group Note (Signed)
  [facility] LCSW Group Therapy Note  Date/Time:  04/29/2022   Type of Therapy and Topic:  Group Therapy:  Feelings About Hospitalization  Participation Level:  Did Not Attend   Description of Group This process group involved patients discussing their feelings related to being hospitalized, as well as the benefits they see to being in the hospital.  These feelings and benefits were itemized.  The group then brainstormed specific ways in which they could seek those same benefits when they discharge and return home.  Therapeutic Goals Patient will identify and describe positive and negative feelings related to hospitalization Patient will verbalize benefits of hospitalization to themselves personally Patients will brainstorm together ways they can obtain similar benefits in the outpatient setting, identify barriers to wellness and possible solutions  Summary of Patient Progress:  The patient did not attend this group.  Therapeutic Modalities Cognitive Behavioral Therapy Motivational [place], [place] 04/29/2022, 10:27 AM

## 2022-04-29 NOTE — Progress Notes (Addendum)
[facility] MD Progress Note  04/29/2022 9:01 AM [name]  MRN:  [id number] Subjective:  [name] is a 51 year old female with a psychiatric history of schizoaffective disorder-bipolar type who presented voluntarily from [facility] and admitted for disorganized thoughts, SI, and paranoia.  Yesterday's recommendations per psychiatry team: - Continue Zyprexa 20 mg nightly -- Loaded with [name] per [name] on 02/17/22 - Continue home Depakote ER 1000 mg nightly.  Will consider titrating pending labs. --START Clozaril 12.5 mg qhs starting 04/29/22 (assuming CRP, troponin, and bmp appropriate) with plan to increase to 25 mg qhs on 04/30/22  On evaluation today:  Patient was seen and assessed in bedroom upon waking.  Patient was initially less disorganized, tangential, and hyperverbal and had nearly normal latency and appropriateness of responses.  Patient reported sleeping and eating well, although she believes that her medications are making her more tired.  However, she endorses regular energy overall.  She denies acute somatic concerns or complaints.  She reports dizziness upon standing, but this is chronic.  Patient is AxOx4.  Patient denies present SI/HI/AVH.  On afternoon reassessment, patient is verbose, disorganized, no longer easily interruptible, and expressing concerns about her daughter starting college.  She describes in detail the college admissions process upon first visit to campus, and goes on other tangents throughout.  Patient continues to be amenable to starting Clozaril tonight.  Review of symptoms, specific for clozapine: Malaise/Sedation: Reports she believes as a medication adverse effect Chest pain: Denies Shortness of breath: Denies Exertional capacity: Normal Tachycardia: Denies Cough: Denies Sore Throat: Denies Fever: Denies Orthostatic hypotension (dizziness with standing): Endorses, but chronic Hypersalivation: Denies Constipation: Denies Symptoms of GERD:  Denies Nausea: Denies Nocturnal enuresis: Denies  Principal Problem: Schizoaffective disorder, bipolar type ([place]) Diagnosis: Principal Problem:   Schizoaffective disorder, bipolar type ([place]) Active Problems:   Suicidal ideations   Total Time spent with patient: 30 minutes  Past Psychiatric History:  Previous Psych Diagnoses: Schizoaffective- BP type Prior inpatient treatment: [facility] 02/2022; [facility] 09/27/19, 07/2021; July 2020 [place] Current/prior outpatient treatment: [name]  Prior rehab hx: Denies Psychotherapy hx: Denies History of suicide: Reports suicide attempt in college by cutting wrists History of homicide: Denies Psychiatric medication history: Prev trials- Depakote ER, Risperdal (helped to calm her), Abilify PO and LAI, PO paliperidone while titrating LAI; haldol (allergies), prolixin, invega , zyprexa, seroquel, Clozaril (unsure of compliance with regimen) Neuromodulation history: ECT in 1993 Current Psychiatrist: [place] Current therapist: Denies  Past Medical History:  Past Medical History:  Diagnosis Date   Anxiety    Asthma    Bipolar 1 disorder ([place])    Depression    Gallstones 08/2020   Hypertension    Insomnia, persistent    Prediabetes    Schizophrenic disorder ([place])    Seizures ([place])     Past Surgical History:  Procedure Laterality Date   BREAST LUMPECTOMY Left 07/2013   TONSILLECTOMY     TUBAL LIGATION  2002   Family History:  Family History  Problem Relation Age of Onset   Depression Mother    Gout Mother    Cancer Father        prostate   Other Father        lung issue   Alcoholism Other    Heart attack Paternal Grandfather    Heart attack Paternal Grandmother    Heart attack Maternal Grandmother    Heart attack Maternal Grandfather    Depression Son    Anxiety disorder Son  Family Psychiatric  History:  Psych: Mother and maternal grandmother with schizophrenia, dad schizoaffective disorder depressive type Psych Rx:  Unknown SA/HA: Uncle completed suicide Substance use family hx: Denies Social History:  Social History   Substance and Sexual Activity  Alcohol Use Not Currently     Social History   Substance and Sexual Activity  Drug Use No    Social History   Socioeconomic History   Marital status: Single    Spouse name: Not on file   Number of children: Not on file   Years of education: Not on file   Highest education level: Not on file  Occupational History   Not on file  Tobacco Use   Smoking status: Former    Types: Cigarettes   Smokeless tobacco: Never  Vaping Use   Vaping Use: Never used  Substance and Sexual Activity   Alcohol use: Not Currently   Drug use: No   Sexual activity: Not Currently  Other Topics Concern   Not on file  Social History Narrative   Not on file   Social Determinants of Health   Financial Resource Strain: Not on file  Food Insecurity: Food Insecurity Present (09/16/2020)   Hunger Vital Sign    Worried About Running Out of Food in the Last Year: Sometimes true    Ran Out of Food in the Last Year: Sometimes true  Transportation Needs: No Transportation Needs (09/16/2020)   PRAPARE - Hydrologist (Medical): No    Lack of Transportation (Non-Medical): No  Physical Activity: Not on file  Stress: Not on file  Social Connections: Not on file   Appetite:  Good  Current Medications: Current Facility-Administered Medications  Medication Dose Route Frequency Provider Last Rate Last Admin   acetaminophen (TYLENOL) tablet 650 mg  650 mg Oral Q6H PRN [name], NP       albuterol (VENTOLIN HFA) 108 (90 Base) MCG/ACT inhaler 2 puff  2 puff Inhalation QID PRN [name], NP       alum & mag hydroxide-simeth (MAALOX/MYLANTA) 200-200-20 MG/5ML suspension 30 mL  30 mL Oral Q4H PRN [name], NP       ascorbic acid (VITAMIN C) tablet 500 mg  500 mg Oral BID [name], NP   500 mg at 04/28/22 1807   atorvastatin  (LIPITOR) tablet 10 mg  10 mg Oral Daily [name], NP   10 mg at 04/28/22 0816   cloZAPine (CLOZARIL) tablet 12.5 mg  12.5 mg Oral Aliene Altes, MD       Followed by   Derrill Memo ON 04/30/2022] cloZAPine (CLOZARIL) tablet 25 mg  25 mg Oral Aliene Altes, MD       divalproex (DEPAKOTE ER) 24 hr tablet 1,000 mg  1,000 mg Oral QHS [name], NP   1,000 mg at 04/28/22 2038   docusate sodium (COLACE) capsule 100 mg  100 mg Oral Daily [name], NP   100 mg at 04/28/22 5621   hydrOXYzine (ATARAX) tablet 25 mg  25 mg Oral TID PRN Harlow Asa, MD       OLANZapine zydis (ZYPREXA) disintegrating tablet 5 mg  5 mg Oral Q8H PRN Nelda Marseille, Asra Gambrel E, MD       And   LORazepam (ATIVAN) tablet 1 mg  1 mg Oral PRN Harlow Asa, MD       And   ziprasidone (GEODON) injection 20 mg  20 mg Intramuscular PRN Harlow Asa, MD  magnesium hydroxide (MILK OF MAGNESIA) suspension 30 mL  30 mL Oral Daily PRN [name], NP       melatonin tablet 3 mg  3 mg Oral QHS [name], NP   3 mg at 04/28/22 2038   metFORMIN (GLUCOPHAGE) tablet 500 mg  500 mg Oral Q breakfast [name], NP   500 mg at 04/28/22 0817   OLANZapine (ZYPREXA) tablet 20 mg  20 mg Oral QHS Rosezetta Schlatter, MD   20 mg at 04/28/22 2038   traZODone (DESYREL) tablet 50 mg  50 mg Oral QHS PRN Harlow Asa, MD        Lab Results:  Results for orders placed or performed during the hospital encounter of 04/26/22 (from the past 48 hour(s))  CBC with Differential/Platelet     Status: None   Collection Time: 04/27/22  6:13 PM  Result Value Ref Range   WBC 5.9 4.0 - 10.5 K/uL   RBC 4.16 3.87 - 5.11 MIL/uL   Hemoglobin 12.1 12.0 - 15.0 g/dL   HCT 38.0 36.0 - 46.0 %   MCV 91.3 80.0 - 100.0 fL   MCH 29.1 26.0 - 34.0 pg   MCHC 31.8 30.0 - 36.0 g/dL   RDW 13.8 11.5 - 15.5 %   Platelets 257 150 - 400 K/uL   nRBC 0.0 0.0 - 0.2 %   Neutrophils Relative % 45 %   Neutro Abs 2.7 1.7 - 7.7 K/uL   Lymphocytes Relative 44 %    Lymphs Abs 2.6 0.7 - 4.0 K/uL   Monocytes Relative 6 %   Monocytes Absolute 0.4 0.1 - 1.0 K/uL   Eosinophils Relative 4 %   Eosinophils Absolute 0.3 0.0 - 0.5 K/uL   Basophils Relative 1 %   Basophils Absolute 0.0 0.0 - 0.1 K/uL   Immature Granulocytes 0 %   Abs Immature Granulocytes 0.01 0.00 - 0.07 K/uL    Comment: Performed at [facility], [place] [address] [place], [place] [address]  Lipid panel     Status: None   Collection Time: 04/27/22  6:13 PM  Result Value Ref Range   Cholesterol 150 0 - 200 mg/dL   Triglycerides 66 <150 mg/dL   HDL 41 >40 mg/dL   Total CHOL/HDL Ratio 3.7 RATIO   VLDL 13 0 - 40 mg/dL   LDL Cholesterol 96 0 - 99 mg/dL    Comment:        Total Cholesterol/HDL:CHD Risk Coronary Heart Disease Risk Table                     Men   Women  1/2 Average Risk   3.4   3.3  Average Risk       5.0   4.4  2 X Average Risk   9.6   7.1  3 X Average Risk  23.4   11.0        Use the calculated Patient Ratio above and the CHD Risk Table to determine the patient's CHD Risk.        ATP III CLASSIFICATION (LDL):  <100     mg/dL   Optimal  100-129  mg/dL   Near or Above                    Optimal  130-159  mg/dL   Borderline  160-189  mg/dL   High  >190     mg/dL   Very High Performed at [facility]  [facility], [place] [address], [place], [place] [address]   Valproic acid level     Status: None   Collection Time: 04/27/22  6:13 PM  Result Value Ref Range   Valproic Acid Lvl 62 50.0 - 100.0 ug/mL    Comment: Performed at [facility], [place] [address] [place], [place] [address]  Hemoglobin A1c     Status: None   Collection Time: 04/27/22  6:13 PM  Result Value Ref Range   Hgb A1c MFr Bld 5.5 4.8 - 5.6 %    Comment: (NOTE) Pre diabetes:          5.7%-6.4%  Diabetes:              >6.4%  Glycemic control for   <7.0% adults with diabetes    Mean Plasma Glucose 111.15 mg/dL    Comment: Performed at [place] [facility] [address] [place], [place] [address]  TSH     Status: None   Collection Time: 04/27/22  6:13 PM  Result Value Ref Range   TSH 0.806 0.350 - 4.500 uIU/mL    Comment: Performed by a 3rd Generation assay with a functional sensitivity of <=0.01 uIU/mL. Performed at [name], [place] [address], [place], [place] [address]   Basic metabolic panel     Status: Abnormal   Collection Time: 04/28/22  6:21 PM  Result Value Ref Range   Sodium 142 135 - 145 mmol/L   Potassium 3.7 3.5 - 5.1 mmol/L   Chloride 108 98 - 111 mmol/L   CO2 27 22 - 32 mmol/L   Glucose, Bld 116 (H) 70 - 99 mg/dL    Comment: Glucose reference range applies only to samples taken after fasting for at least 8 hours.   BUN 7 6 - 20 mg/dL   Creatinine, Ser 0.75 0.44 - 1.00 mg/dL   Calcium 9.8 8.9 - 10.3 mg/dL   GFR, Estimated >60 >60 mL/min    Comment: (NOTE) Calculated using the CKD-EPI Creatinine Equation (2021)    Anion gap 7 5 - 15    Comment: Performed at [facility], [place] [address], [place], [place] [address]  C-reactive protein     Status: Abnormal   Collection Time: 04/28/22  6:21 PM  Result Value Ref Range   CRP 1.0 (H) <1.0 mg/dL    Comment: Performed at [facility] [address] [place], [place] [address]    Blood Alcohol level:  Lab Results  Component Value Date   [facility] <10 04/25/2022   ETH <10 34/28/7681    Metabolic Disorder Labs: Lab Results  Component Value Date   HGBA1C 5.5 04/27/2022   MPG 111.15 04/27/2022   MPG 119.76 08/12/2021   Lab Results  Component Value Date   PROLACTIN 74.6 (H) 09/28/2019   PROLACTIN 4.2 (L) 12/19/2018   Lab Results  Component Value Date   CHOL 150 04/27/2022   TRIG 66 04/27/2022   HDL 41 04/27/2022   CHOLHDL 3.7 04/27/2022   VLDL 13 04/27/2022   LDLCALC 96 04/27/2022   LDLCALC 74 08/16/2021    Physical Findings:  Musculoskeletal: Strength & Muscle Tone: within normal limits Gait & Station:  Not  observed by this Probation officer Patient leans: N/A  Psychiatric Specialty Exam:  Presentation  General Appearance: Appropriate for Environment; Casual; Fairly Groomed   Eye Contact:Good   Speech:hyper-verbal, and a bit more pressured than in previous days on reassessment   Speech Volume:Normal  Handedness:Right    Mood and Affect  Mood: "Tired"   Affect: Blunted    Thought Process  Thought Processes:Disorganized   Descriptions of Associations:Tangential   Orientation:Full (Time, Place and Person)   Thought Content:Denies ideas of reference, first rank symptoms, AVH, SI or HI - is paranoid on exam but is not grossly responding to internal stimuli   History of Schizophrenia/Schizoaffective disorder:Yes   Duration of Psychotic Symptoms:Greater than six months   Hallucinations: Denies  Ideas of Reference:None   Suicidal Thoughts: Denies  Homicidal Thoughts: Denies   Sensorium  Memory:Immediate Fair; Recent Fair   Judgment:Impaired   Insight:Poor    Executive Functions  Concentration:Fair   Attention Span:Fair   [place]    Psychomotor Activity  Psychomotor Activity: Normal   Assets  Assets:Desire for Improvement; Financial Resources/Insurance; Housing; Social Support    Sleep  Sleep: 7 hours    Physical Exam: Physical Exam Vitals reviewed.  Constitutional:      General: She is not in acute distress.    Appearance: She is not toxic-appearing.  HENT:     Head: Normocephalic and atraumatic.     Mouth/Throat:     Mouth: Mucous membranes are moist.     Pharynx: Oropharynx is clear.  Pulmonary:     Effort: Pulmonary effort is normal.  Neurological:     General: No focal deficit present.     Mental Status: She is alert and oriented to person, place, and time.    Review of Systems  Respiratory:  Negative for shortness of breath.   Cardiovascular:  Negative for chest pain.   Gastrointestinal: Negative.   Genitourinary: Negative.   Musculoskeletal:        Patient believes she is a fall risk  Neurological:  Positive for dizziness. Negative for tremors and headaches.   Blood pressure (!) 103/53, pulse 90, temperature 98 F (36.7 C), temperature source Oral, resp. rate 20, height '5\' 6"'$  (1.676 m), weight 104.1 kg, SpO2 100 %. Body mass index is 37.03 kg/m.   Treatment Plan Summary: ASSESSMENT: Principal Problem:   Schizoaffective disorder, bipolar type ([place]) Active Problems:   Suicidal ideations     PLAN: Safety and Monitoring:             -- Voluntary admission to inpatient psychiatric unit for safety, stabilization and treatment             -- Daily contact with patient to assess and evaluate symptoms and progress in treatment             -- [name] case to be discussed in multi-disciplinary team meeting             -- Observation Level : q15 minute checks             -- Vital signs:  q12 hours             -- Precautions: suicide, elopement, and assault   2. Psychiatric Diagnoses and Treatment:  # Schizoaffective disorder, bipolar type #Suicidal ideation without plan - Decreased to Zyprexa 15 mg nightly -- Loaded with [name] per [name] on 02/17/22 - Continue home Depakote ER 1000 mg nightly.  Will consider titrating pending labs.             Trough level 62, CBC wnl; CMP with LFTs WNL.  CBC with chronically normocytic anemia-hemoglobin 11.2, MCV 90.3, platelets 234, WBC 5.3 --START Clozaril 12.5 mg qhs  (CRP 1.0, troponin 2,  and bmp WNL appropriate) with plan to increase to 25 mg qhs on 04/30/22  -Patient informed of r/b/se including risk of agranulocytosis and need for weekly labs and amenable to medication trial  -04/27/22 ANC 2,700. 04/26/22 ECG unchanged. ECHO obtained in Dec 2022 showed EF 60%, mild LV hypertrophy and Grade I diastolic dysfunction   3. Medical Diagnoses and Treatment: #Prediabetes #History of dyslipidemia - A1c 5.5 and  Lipids WNL - Continue metformin 500 mg once daily for prediabetes and prevention of antipsychotic induced metabolic syndrome - Continue home atorvastatin 10 mg daily  # History of asthma - Rescue inhaler available 4 times daily as needed  #Anemia -- appears chronic - needs f/u with PCP after discharge   4. Discharge Planning:              -- Social work and case management to assist with discharge planning and identification of hospital follow-up needs prior to discharge             -- Estimated LOS 7-10 days             -- Discharge Concerns: Need to establish a safety plan             -- Discharge Goals: Return home with outpatient referrals for mental health follow-up including medication management/psychotherapy    Rosezetta Schlatter, MD 04/29/2022, 9:01 AM

## 2022-04-30 DIAGNOSIS — F250 Schizoaffective disorder, bipolar type: Secondary | ICD-10-CM | POA: Diagnosis not present

## 2022-04-30 MED ORDER — CLOZAPINE 25 MG PO TABS
37.5000 mg | ORAL_TABLET | Freq: Every day | ORAL | Status: DC
  Filled 2022-04-30: qty 2

## 2022-04-30 MED ORDER — OLANZAPINE 5 MG PO TABS
5.0000 mg | ORAL_TABLET | Freq: Every day | ORAL | Status: DC
  Filled 2022-04-30: qty 1

## 2022-04-30 MED ORDER — OLANZAPINE 10 MG PO TABS
10.0000 mg | ORAL_TABLET | Freq: Every day | ORAL | Status: DC
  Filled 2022-04-30: qty 1

## 2022-04-30 MED ORDER — CLOZAPINE 25 MG PO TABS: 50.0000 mg | ORAL_TABLET | Freq: Every day | ORAL | Status: DC

## 2022-04-30 NOTE — BHH Group Notes (Signed)
Adult Psychoeducational Group Note  Date:  04/30/2022 Time:  4:51 PM  Group Topic/Focus:  Goals Group:   The focus of this group is to help patients establish daily goals to achieve during treatment and discuss how the patient can incorporate goal setting into their daily lives to aide in recovery.  Participation Level:  Did Not Attend  Additional Comments: Patient didn't attend group due to not feeling well.   Emylie Amster T Ria Comment 04/30/2022, 4:51 PM

## 2022-04-30 NOTE — Progress Notes (Signed)
The focus of this group is to help patients review their daily goal of treatment and discuss progress on daily workbooks.  Pt attended the evening group and responded to all discussion prompts from the [place]. Pt shared that today was a good day on the unit, the highlight of which was a good conversation with her daughter on the phone.  Pt told that her goals for the week were to stay positive while working to achieve stability. "I just need to remember to trust the doctors."  Pt rated her day a 5 out of 10 and her affect was appropriate, though she presented as hyper verbal and required some redirection to stay on topic.

## 2022-04-30 NOTE — Group Note (Signed)
[facility] LCSW Group Therapy Note  Date/Time:  04/30/2022   Type of Therapy and Topic:  Group Therapy:  Healthy and Unhealthy Supports  Participation Level:  Did Not Attend   Description of Group:  Patients in this group were introduced to the idea of adding a variety of healthy supports to address the various needs in their lives.  Patients discussed what additional healthy supports could be helpful in their recovery and wellness after discharge in order to prevent future hospitalizations such as counselor, doctor, other levels of psychiatric care such as [name] services, therapy groups, 12-step groups, and problem-specific support groups.  A demonstration was given about how to set boundaries which patients expressed was beneficial.    Therapeutic Goals:   1)  discuss importance of adding supports to stay well once out of the hospital  2)  compare healthy versus unhealthy supports and identify some examples of each  3)  generate ideas and descriptions of healthy supports that can be added  4)  offer mutual support about how to address unhealthy supports  5)  encourage active participation in and adherence to discharge plan    Summary of Patient Progress:  The patient did not attend group.   Therapeutic Modalities:   Motivational Interviewing Brief Solution-Focused Therapy  Abhimanyu Cruces [place], LCSWA

## 2022-04-30 NOTE — BHH Suicide Risk Assessment (Signed)
[place] INPATIENT:  Family/Significant Other Suicide Prevention Education  Suicide Prevention Education:  Education Completed;  [name] [phone] (Daughter),  (name of family member/significant other) has been identified by the patient as the family member/significant other with whom the patient will be residing, and identified as the person(s) who will aid the patient in the event of a mental health crisis (suicidal ideations/suicide attempt).    Daughter stated that she has been in her mother's apartment and there are no weapons.  She stated she will be the person to pick up the patient at discharge to take her home.    With written consent from the patient, the family member/significant other has been provided the following suicide prevention education, prior to the and/or following the discharge of the patient.  The suicide prevention education provided includes the following: Suicide risk factors Suicide prevention and interventions National Suicide Hotline telephone number [facility] assessment telephone number [facility] Emergency Assistance Onward and/or Residential Mobile Crisis Unit telephone number  Request made of family/significant other to: Remove weapons (e.g., guns, rifles, knives), all items previously/currently identified as safety concern.   Remove drugs/medications (over-the-counter, prescriptions, illicit drugs), all items previously/currently identified as a safety concern.  The family member/significant other verbalizes understanding of the suicide prevention education information provided.  The family member/significant other agrees to remove the items of safety concern listed above.  [name] 04/30/2022, 4:01 PM

## 2022-04-30 NOTE — Progress Notes (Signed)
   04/30/22 1000  Psych Admission Type (Psych Patients Only)  Admission Status Voluntary  Psychosocial Assessment  Patient Complaints Anxiety;Confusion;Worrying  Eye Contact Fair  Facial Expression Flat  Affect Anxious;Flat  Speech Tangential  Interaction Cautious  Motor Activity Slow  Appearance/Hygiene Unremarkable  Behavior Characteristics Cooperative;Anxious;Guarded  Mood Anxious;Preoccupied  Thought Process  Coherency Disorganized;Tangential  Content Delusions;Preoccupation  Delusions Paranoid  Perception WDL  Hallucination None reported or observed  Judgment Poor  Confusion Mild  Danger to Self  Current suicidal ideation? Denies  Danger to Others  Danger to Others None reported or observed

## 2022-04-30 NOTE — Progress Notes (Addendum)
[facility] MD Progress Note  04/30/2022 12:39 PM [name]  MRN:  [id number] Subjective:  [name] is a 51 year old female with a psychiatric history of schizoaffective disorder-bipolar type who presented voluntarily from [facility] and admitted for disorganized thoughts, SI, and paranoia.  Yesterday's recommendations per psychiatry team: - Decreased to Zyprexa 15 mg nightly -- Loaded with [name] per [name] on 02/17/22 - Continue home Depakote ER 1000 mg nightly.  --START Clozaril 12.5 mg qhs  (CRP 1.0, troponin 2, and bmp WNL appropriate) with plan to increase to 25 mg qhs on 04/30/22  On evaluation today:  Patient was seen and assessed in bedroom around 11 AM, and still in bed.  Patient was somnolent throughout the assessment, but engaged appropriately.  As she was just awaking, she was less disorganized, tangential, and hyperverbal and had nearly normal latency and appropriateness of responses.  Patient reported believing that her medications are currently too sedating, but she is eating well.  She is advised that we are decreasing the Zyprexa as we increased the Clozaril, and she voices understanding.  She denies acute somatic concerns or complaints. Patient is AxOx4.  Patient denies present SI/HI/AVH.  Review of symptoms, specific for clozapine: Malaise/Sedation: Present, but reports she believes as a medication adverse effect Chest pain: Denies Shortness of breath: Denies Exertional capacity: Normal Tachycardia: Denies Cough: Denies Sore Throat: Denies Fever: Denies Orthostatic hypotension (dizziness with standing): Endorses, but chronic Hypersalivation: Denies when not eating Constipation: Denies Symptoms of GERD: Denies Nausea: Denies, reports may have been nauseous 2 days ago Nocturnal enuresis: Denies  Principal Problem: Schizoaffective disorder, bipolar type ([place]) Diagnosis: Principal Problem:   Schizoaffective disorder, bipolar type ([place]) Active Problems:   Suicidal  ideations   Total Time spent with patient: 30 minutes  Past Psychiatric History:  Previous Psych Diagnoses: Schizoaffective- BP type Prior inpatient treatment: [facility] 02/2022; [facility] 09/27/19, 07/2021; July 2020 [place] Current/prior outpatient treatment: [name]  Prior rehab hx: Denies Psychotherapy hx: Denies History of suicide: Reports suicide attempt in college by cutting wrists History of homicide: Denies Psychiatric medication history: Prev trials- Depakote ER, Risperdal (helped to calm her), Abilify PO and LAI, PO paliperidone while titrating LAI; haldol (allergies), prolixin, invega , zyprexa, seroquel, Clozaril (unsure of compliance with regimen) Neuromodulation history: ECT in 1993 Current Psychiatrist: [place] Current therapist: Denies  Past Medical History:  Past Medical History:  Diagnosis Date   Anxiety    Asthma    Bipolar 1 disorder ([place])    Depression    Gallstones 08/2020   Hypertension    Insomnia, persistent    Prediabetes    Schizophrenic disorder ([place])    Seizures ([place])     Past Surgical History:  Procedure Laterality Date   BREAST LUMPECTOMY Left 07/2013   TONSILLECTOMY     TUBAL LIGATION  2002   Family History:  Family History  Problem Relation Age of Onset   Depression Mother    Gout Mother    Cancer Father        prostate   Other Father        lung issue   Alcoholism Other    Heart attack Paternal Grandfather    Heart attack Paternal Grandmother    Heart attack Maternal Grandmother    Heart attack Maternal Grandfather    Depression Son    Anxiety disorder Son    Family Psychiatric  History:  Psych: Mother and maternal grandmother with schizophrenia, dad schizoaffective disorder depressive type Psych Rx:  Unknown SA/HA: Uncle completed suicide Substance use family hx: Denies Social History:  Social History   Substance and Sexual Activity  Alcohol Use Not Currently     Social History   Substance and Sexual Activity   Drug Use No    Social History   Socioeconomic History   Marital status: Single    Spouse name: Not on file   Number of children: Not on file   Years of education: Not on file   Highest education level: Not on file  Occupational History   Not on file  Tobacco Use   Smoking status: Former    Types: Cigarettes   Smokeless tobacco: Never  Vaping Use   Vaping Use: Never used  Substance and Sexual Activity   Alcohol use: Not Currently   Drug use: No   Sexual activity: Not Currently  Other Topics Concern   Not on file  Social History Narrative   Not on file   Social Determinants of Health   Financial Resource Strain: Not on file  Food Insecurity: Food Insecurity Present (09/16/2020)   Hunger Vital Sign    Worried About Running Out of Food in the Last Year: Sometimes true    Ran Out of Food in the Last Year: Sometimes true  Transportation Needs: No Transportation Needs (09/16/2020)   PRAPARE - Hydrologist (Medical): No    Lack of Transportation (Non-Medical): No  Physical Activity: Not on file  Stress: Not on file  Social Connections: Not on file   Sleep: Hypersomniac  Appetite:  Good  Current Medications: Current Facility-Administered Medications  Medication Dose Route Frequency Provider Last Rate Last Admin   acetaminophen (TYLENOL) tablet 650 mg  650 mg Oral Q6H PRN [name], NP       albuterol (VENTOLIN HFA) 108 (90 Base) MCG/ACT inhaler 2 puff  2 puff Inhalation QID PRN [name], NP       alum & mag hydroxide-simeth (MAALOX/MYLANTA) 200-200-20 MG/5ML suspension 30 mL  30 mL Oral Q4H PRN [name], NP       ascorbic acid (VITAMIN C) tablet 500 mg  500 mg Oral BID [name], NP   500 mg at 04/30/22 0757   atorvastatin (LIPITOR) tablet 10 mg  10 mg Oral Daily [name], NP   10 mg at 04/30/22 0757   cloZAPine (CLOZARIL) tablet 25 mg  25 mg Oral Aliene Altes, MD       Derrill Memo ON 05/01/2022] cloZAPine (CLOZARIL) tablet  37.5 mg  37.5 mg Oral QHS Rosezetta Schlatter, MD       Followed by   Derrill Memo ON 05/02/2022] cloZAPine (CLOZARIL) tablet 50 mg  50 mg Oral QHS Rosezetta Schlatter, MD       divalproex (DEPAKOTE ER) 24 hr tablet 1,000 mg  1,000 mg Oral QHS [name], NP   1,000 mg at 04/29/22 2122   docusate sodium (COLACE) capsule 100 mg  100 mg Oral Daily [name], NP   100 mg at 04/30/22 0757   feeding supplement (KATE FARMS STANDARD 1.4) liquid 325 mL  325 mL Oral BID PRN Harlow Asa, MD       hydrOXYzine (ATARAX) tablet 25 mg  25 mg Oral TID PRN Harlow Asa, MD       OLANZapine zydis (ZYPREXA) disintegrating tablet 5 mg  5 mg Oral Q8H PRN Nelda Marseille, Murat Rideout E, MD       And   LORazepam (ATIVAN) tablet 1 mg  1  mg Oral PRN Harlow Asa, MD       And   ziprasidone (GEODON) injection 20 mg  20 mg Intramuscular PRN Harlow Asa, MD       magnesium hydroxide (MILK OF MAGNESIA) suspension 30 mL  30 mL Oral Daily PRN [name], NP       melatonin tablet 3 mg  3 mg Oral QHS [name], NP   3 mg at 04/29/22 2123   metFORMIN (GLUCOPHAGE) tablet 500 mg  500 mg Oral Q breakfast [name], NP   500 mg at 04/30/22 0757   OLANZapine (ZYPREXA) tablet 10 mg  10 mg Oral QHS Rosezetta Schlatter, MD       [START ON 05/01/2022] OLANZapine (ZYPREXA) tablet 5 mg  5 mg Oral QHS Rosezetta Schlatter, MD       traZODone (DESYREL) tablet 50 mg  50 mg Oral QHS PRN Harlow Asa, MD        Lab Results:  Results for orders placed or performed during the hospital encounter of 04/26/22 (from the past 48 hour(s))  Basic metabolic panel     Status: Abnormal   Collection Time: 04/28/22  6:21 PM  Result Value Ref Range   Sodium 142 135 - 145 mmol/L   Potassium 3.7 3.5 - 5.1 mmol/L   Chloride 108 98 - 111 mmol/L   CO2 27 22 - 32 mmol/L   Glucose, Bld 116 (H) 70 - 99 mg/dL    Comment: Glucose reference range applies only to samples taken after fasting for at least 8 hours.   BUN 7 6 - 20 mg/dL   Creatinine, Ser 0.75 0.44  - 1.00 mg/dL   Calcium 9.8 8.9 - 10.3 mg/dL   GFR, Estimated >60 >60 mL/min    Comment: (NOTE) Calculated using the CKD-EPI Creatinine Equation (2021)    Anion gap 7 5 - 15    Comment: Performed at [facility], [place] [address] [place], [place] [address]  C-reactive protein     Status: Abnormal   Collection Time: 04/28/22  6:21 PM  Result Value Ref Range   CRP 1.0 (H) <1.0 mg/dL    Comment: Performed at [facility] [address], [place], [place] [address]  Troponin I (High Sensitivity)     Status: None   Collection Time: 04/28/22  6:21 PM  Result Value Ref Range   Troponin I (High Sensitivity) 2 <18 ng/L    Comment: (NOTE) Elevated high sensitivity troponin I (hsTnI) values and significant  changes across serial measurements may suggest ACS but many other  chronic and acute conditions are known to elevate hsTnI results.  Refer to the "Links" section for chest pain algorithms and additional  guidance. Performed at [facility], [place] [address] [place], [place] [address]     Blood Alcohol level:  Lab Results  Component Value Date   [facility] <10 04/25/2022   ETH <10 38/18/2993    Metabolic Disorder Labs: Lab Results  Component Value Date   HGBA1C 5.5 04/27/2022   MPG 111.15 04/27/2022   MPG 119.76 08/12/2021   Lab Results  Component Value Date   PROLACTIN 74.6 (H) 09/28/2019   PROLACTIN 4.2 (L) 12/19/2018   Lab Results  Component Value Date   CHOL 150 04/27/2022   TRIG 66 04/27/2022   HDL 41 04/27/2022   CHOLHDL 3.7 04/27/2022   VLDL 13 04/27/2022   LDLCALC 96 04/27/2022   LDLCALC 74 08/16/2021  Physical Findings:  Musculoskeletal: Strength & Muscle Tone:  Not able to assess as patient is lying in bed sleeping Gait & Station:  Not observed by this Probation officer Patient leans: N/A  Psychiatric Specialty Exam:  Presentation  General Appearance: Appropriate for Environment; Casual   Eye Contact: None   Speech:  Normal rate, some baseline slurring   Speech Volume:Normal   Handedness:Right    Mood and Affect  Mood: "Tired"   Affect: Blunted    Thought Process  Thought Processes:Other (comment) (Unable to assess due to patient's somnolence)   Descriptions of Associations:-- (Unable to assess due to patient's somnolence)  Orientation:Full (Time, Place and Person)   Thought Content:Denies ideas of reference, first rank symptoms, AVH, SI or HI - is paranoid on exam but is not grossly responding to internal stimuli   History of Schizophrenia/Schizoaffective disorder:Yes   Duration of Psychotic Symptoms:Greater than six months   Hallucinations: Denies  Ideas of Reference:None   Suicidal Thoughts: Denies  Homicidal Thoughts: Denies   Sensorium  Memory:-- (Unable to assess due to patient's somnolence)   Judgment:Impaired   Insight:-- (Unable to assess due to patient's somnolence)    Executive Functions  Concentration:-- (Unable to assess due to patient's somnolence)   Attention Span:-- (Unable to assess due to patient's somnolence)   Recall:-- (Unable to assess due to patient's somnolence)   Fund of Knowledge:Fair   Language:Fair    Psychomotor Activity  Psychomotor Activity: Normal   Assets  Assets:Desire for Improvement; Financial Resources/Insurance; Housing; Social Support    Sleep  Sleep: Sleeping most of the day    Physical Exam: Physical Exam Vitals reviewed.  Constitutional:      General: She is not in acute distress.    Appearance: She is not toxic-appearing.  HENT:     Head: Normocephalic and atraumatic.     Mouth/Throat:     Mouth: Mucous membranes are moist.     Pharynx: Oropharynx is clear.  Pulmonary:     Effort: Pulmonary effort is normal.  Neurological:     General: No focal deficit present.     Mental Status: She is alert and oriented to person, place, and time.    Review of Systems  Respiratory:  Negative for  shortness of breath.   Cardiovascular:  Negative for chest pain.  Gastrointestinal: Negative.   Genitourinary: Negative.   Musculoskeletal:        Patient believes she is a fall risk  Neurological:  Positive for dizziness. Negative for tremors and headaches.   Blood pressure 114/82, pulse 95, temperature 98 F (36.7 C), temperature source Oral, resp. rate 20, height '5\' 6"'$  (1.676 m), weight 104.1 kg, SpO2 100 %. Body mass index is 37.03 kg/m.   Treatment Plan Summary: ASSESSMENT: Principal Problem:   Schizoaffective disorder, bipolar type ([place]) Active Problems:   Suicidal ideations     PLAN: Safety and Monitoring:             -- Voluntary admission to inpatient psychiatric unit for safety, stabilization and treatment             -- Daily contact with patient to assess and evaluate symptoms and progress in treatment             -- [name] case to be discussed in multi-disciplinary team meeting             -- Observation Level : q15 minute checks             -- Vital  signs:  q12 hours             -- Precautions: suicide, elopement, and assault   2. Psychiatric Diagnoses and Treatment:  # Schizoaffective disorder, bipolar type #Suicidal ideation without plan - Decreased to Zyprexa 10 mg nightly tonight titrating to '5mg'$  qhs on 9/4 - goal of tapering off while she titrates onto Clozaril -- Loaded with [name] per [name] on 02/17/22 -- Hold Melatonin and Trazodone given oversedation concerns - Continue home Depakote ER 1000 mg nightly.  Trough level 62, CMP with LFTs WNL.  CBC with chronically normocytic anemia-hemoglobin 11.2, MCV 90.3, platelets 234, WBC 5.3 on 8/31 -- Continue titrating Clozaril by 12.'5mg'$  nightly - Increase to 25 mg tonight, followed by 37.5 mg 9/4, followed by 50 mg on 9/5 Patient informed of r/b/se including risk of agranulocytosis and need for weekly labs and amenable to medication trial - will ongoing education about medication as she continues to  clear Weekly CBC, CRP, Troponin and EKG:  04/26/22 ECG unchanged and QTC 463m;  9/1 Troponin 2; 9/1 CRP 1.0; 8/31 WBC 5.9 with ANC 2700- Repeat labs and EKG on 9/7 ECHO obtained in Dec 2022 showed EF 60%, mild LV hypertrophy and Grade I diastolic dysfunction Continue Colace '100mg'$  daily and monitor bowel function   3. Medical Diagnoses and Treatment: #Prediabetes #History of dyslipidemia - A1c 5.5 and Lipids WNL - Continue metformin 500 mg once daily for prediabetes and prevention of antipsychotic induced metabolic syndrome - Continue home atorvastatin 10 mg daily  # History of asthma - Rescue inhaler available 4 times daily as needed  #Anemia -- appears chronic - needs f/u with PCP after discharge   4. Discharge Planning:              -- Social work and case management to assist with discharge planning and identification of hospital follow-up needs prior to discharge             -- Estimated LOS 7-10 days             -- Discharge Concerns: Need to establish a safety plan             -- Discharge Goals: Return home with outpatient referrals for mental health follow-up including medication management/psychotherapy    CRosezetta Schlatter MD 04/30/2022, 12:39 PM

## 2022-05-01 ENCOUNTER — Encounter (HOSPITAL_COMMUNITY): Payer: Self-pay

## 2022-05-01 DIAGNOSIS — F250 Schizoaffective disorder, bipolar type: Secondary | ICD-10-CM | POA: Diagnosis not present

## 2022-05-01 MED ORDER — OLANZAPINE 10 MG PO TABS
10.0000 mg | ORAL_TABLET | Freq: Every day | ORAL | Status: DC
  Administered 2022-05-01 – 2022-05-02 (×2): 10 mg via ORAL
  Filled 2022-05-01 (×4): qty 1

## 2022-05-01 MED ORDER — CLOZAPINE 25 MG PO TABS
25.0000 mg | ORAL_TABLET | Freq: Every day | ORAL | Status: AC
  Administered 2022-05-01: 25 mg via ORAL
  Filled 2022-05-01: qty 1

## 2022-05-01 MED ORDER — CLOZAPINE 25 MG PO TABS: 25.0000 mg | ORAL_TABLET | Freq: Every day | ORAL | Status: DC

## 2022-05-01 NOTE — Progress Notes (Signed)
   05/01/22 0000  Psych Admission Type (Psych Patients Only)  Admission Status Voluntary  Psychosocial Assessment  Patient Complaints Anxiety  Eye Contact Brief  Facial Expression Grimacing  Affect Irritable;Preoccupied  Speech Tangential  Interaction Guarded  Motor Activity Slow  Appearance/Hygiene Unremarkable  Behavior Characteristics Anxious  Mood Preoccupied  Thought Process  Coherency Disorganized;Tangential  Content Delusions  Delusions Paranoid  Perception WDL  Hallucination None reported or observed  Judgment Poor  Confusion Mild  Danger to Self  Current suicidal ideation? Denies  Self-Injurious Behavior No self-injurious ideation or behavior indicators observed or expressed   Agreement Not to Harm Self Yes  Description of Agreement verbal  Danger to Others  Danger to Others None reported or observed

## 2022-05-01 NOTE — Progress Notes (Addendum)
[facility] MD Progress Note  05/01/2022 8:34 AM [name]  MRN:  [id number]  Chief Complaint: psychosis, SI  Reason for Admission:  [name] is a 51 y.o. female with a history of  with a psychiatric history of schizoaffective disorder-bipolar type who presented voluntarily from [facility] and was admitted for disorganized thoughts, SI, and paranoia.The patient is currently on Hospital Day 5.   Chart Review from last 24 hours:  The patient's chart was reviewed and nursing notes were reviewed. The patient's case was discussed in multidisciplinary team meeting. Per nursing, she was guarded, irritable, disorganized, and delusional yesterday. She refused qhs medications and only attended one evening group. Per MAR she was compliant with daytime scheduled medications and did not require PRNs.   Information Obtained Today During Patient Interview: The patient was seen and evaluated on the unit. On assessment today the patient reports that she usually has trouble getting up in the mornings and feels her sleep was interrupted last night several times getting up to urinate. She states she feels sleepy this morning and she was encouraged to get out of bed, shower, and attend groups. She did go to the cafeteria for breakfast. She states she was too tired to get up for nighttime medications last night, and the need to comply with medications was again stressed. She denies known medication side-effects. She denies AVH, thought insertion/withdrawal, ideas of reference, or magical thinking. When questioned about belief in thought broadcasting, she states she is unsure. She denies SI, HI, or difficulty with appetite. She derails into discussion about childhood stories, need for an FL2 form, and about belief she may be deaf partially in an ear. She states she moved her bowels this morning and late last night and voices no physical complaints today. She is oriented X3.    Review of symptoms, specific for  clozapine: Malaise/Sedation: Sedated this morning but states is a chronic issue on home meds Chest pain: Denies Shortness of breath: Denies Exertional capacity: Normal Tachycardia: Denies Cough: Denies Sore Throat: Denies Fever: Denies Orthostatic hypotension (dizziness with standing): Denies  Hypersalivation: Denies when not eating Constipation: Denies Symptoms of GERD: Denies Nausea: Denies Nocturnal enuresis: Denies  Principal Problem: Schizoaffective disorder, bipolar type ([place]) Diagnosis: Principal Problem:   Schizoaffective disorder, bipolar type ([place]) Active Problems:   Suicidal ideations  Total Time Spent in Direct Patient Care:  I personally spent 30 minutes on the unit in direct patient care. The direct patient care time included face-to-face time with the patient, reviewing the patient's chart, communicating with other professionals, and coordinating care. Greater than 50% of this time was spent in counseling or coordinating care with the patient regarding goals of hospitalization, psycho-education, and discharge planning needs.  Past Psychiatric History:  Previous Psych Diagnoses: Schizoaffective- BP type Prior inpatient treatment: [facility] 02/2022; [facility] 09/27/19, 07/2021; July 2020 [place] Current/prior outpatient treatment: [name]  Prior rehab hx: Denies Psychotherapy hx: Denies History of suicide: Reports suicide attempt in college by cutting wrists History of homicide: Denies Psychiatric medication history: Prev trials- Depakote ER, Risperdal (helped to calm her), Abilify PO and LAI, PO paliperidone while titrating LAI; haldol (allergies), prolixin, invega , zyprexa, seroquel, Clozaril (unsure of compliance with regimen) Neuromodulation history: ECT in 1993 Current Psychiatrist: [facility] Current therapist: Denies  Past Medical History:  Past Medical History:  Diagnosis Date   Anxiety    Asthma    Bipolar 1 disorder ([place])    Depression     Gallstones 08/2020   Hypertension  Insomnia, persistent    Prediabetes    Schizophrenic disorder (HCC)    Seizures ([place])     Past Surgical History:  Procedure Laterality Date   BREAST LUMPECTOMY Left 07/2013   TONSILLECTOMY     TUBAL LIGATION  2002   Family History:  Family History  Problem Relation Age of Onset   Depression Mother    Gout Mother    Cancer Father        prostate   Other Father        lung issue   Alcoholism Other    Heart attack Paternal Grandfather    Heart attack Paternal Grandmother    Heart attack Maternal Grandmother    Heart attack Maternal Grandfather    Depression Son    Anxiety disorder Son    Family Psychiatric  History:  Psych: Mother and maternal grandmother with schizophrenia, dad schizoaffective disorder depressive type Psych Rx: Unknown SA/HA: Uncle completed suicide Substance use family hx: Denies  Social History:  Social History   Substance and Sexual Activity  Alcohol Use Not Currently     Social History   Substance and Sexual Activity  Drug Use No    Social History   Socioeconomic History   Marital status: Single    Spouse name: Not on file   Number of children: Not on file   Years of education: Not on file   Highest education level: Not on file  Occupational History   Not on file  Tobacco Use   Smoking status: Former    Types: Cigarettes   Smokeless tobacco: Never  Vaping Use   Vaping Use: Never used  Substance and Sexual Activity   Alcohol use: Not Currently   Drug use: No   Sexual activity: Not Currently  Other Topics Concern   Not on file  Social History Narrative   Not on file   Social Determinants of Health   Financial Resource Strain: Not on file  Food Insecurity: Food Insecurity Present (09/16/2020)   Hunger Vital Sign    Worried About Running Out of Food in the Last Year: Sometimes true    Ran Out of Food in the Last Year: Sometimes true  Transportation Needs: No Transportation Needs  (09/16/2020)   PRAPARE - Hydrologist (Medical): No    Lack of Transportation (Non-Medical): No  Physical Activity: Not on file  Stress: Not on file  Social Connections: Not on file   Sleep: Increased sedation - sleep hours 8.5  Appetite:  Good  Current Medications: Current Facility-Administered Medications  Medication Dose Route Frequency Provider Last Rate Last Admin   acetaminophen (TYLENOL) tablet 650 mg  650 mg Oral Q6H PRN [name], NP       albuterol (VENTOLIN HFA) 108 (90 Base) MCG/ACT inhaler 2 puff  2 puff Inhalation QID PRN [name], NP       alum & mag hydroxide-simeth (MAALOX/MYLANTA) 200-200-20 MG/5ML suspension 30 mL  30 mL Oral Q4H PRN [name], NP       ascorbic acid (VITAMIN C) tablet 500 mg  500 mg Oral BID [name], NP   500 mg at 05/01/22 0802   atorvastatin (LIPITOR) tablet 10 mg  10 mg Oral Daily [name], NP   10 mg at 05/01/22 0803   cloZAPine (CLOZARIL) tablet 25 mg  25 mg Oral Aliene Altes, MD       cloZAPine (CLOZARIL) tablet 37.5 mg  37.5 mg Oral QHS Rosezetta Schlatter,  MD       Followed by   Derrill Memo ON 05/02/2022] cloZAPine (CLOZARIL) tablet 50 mg  50 mg Oral QHS Rosezetta Schlatter, MD       divalproex (DEPAKOTE ER) 24 hr tablet 1,000 mg  1,000 mg Oral QHS [name], NP   1,000 mg at 04/29/22 2122   docusate sodium (COLACE) capsule 100 mg  100 mg Oral Daily [name], NP   100 mg at 05/01/22 7628   feeding supplement (KATE FARMS STANDARD 1.4) liquid 325 mL  325 mL Oral BID PRN Harlow Asa, MD       hydrOXYzine (ATARAX) tablet 25 mg  25 mg Oral TID PRN Harlow Asa, MD       OLANZapine zydis (ZYPREXA) disintegrating tablet 5 mg  5 mg Oral Q8H PRN Harlow Asa, MD       And   LORazepam (ATIVAN) tablet 1 mg  1 mg Oral PRN Harlow Asa, MD       And   ziprasidone (GEODON) injection 20 mg  20 mg Intramuscular PRN Nelda Marseille, Cailan Antonucci E, MD       magnesium hydroxide (MILK OF MAGNESIA) suspension  30 mL  30 mL Oral Daily PRN [name], NP       metFORMIN (GLUCOPHAGE) tablet 500 mg  500 mg Oral Q breakfast [name], NP   500 mg at 05/01/22 0803   OLANZapine (ZYPREXA) tablet 10 mg  10 mg Oral QHS Rosezetta Schlatter, MD       OLANZapine (ZYPREXA) tablet 5 mg  5 mg Oral QHS Rosezetta Schlatter, MD        Lab Results:  No results found for this or any previous visit (from the past 48 hour(s)).   Blood Alcohol level:  Lab Results  Component Value Date   ETH <10 04/25/2022   ETH <10 31/51/7616    Metabolic Disorder Labs: Lab Results  Component Value Date   HGBA1C 5.5 04/27/2022   MPG 111.15 04/27/2022   MPG 119.76 08/12/2021   Lab Results  Component Value Date   PROLACTIN 74.6 (H) 09/28/2019   PROLACTIN 4.2 (L) 12/19/2018   Lab Results  Component Value Date   CHOL 150 04/27/2022   TRIG 66 04/27/2022   HDL 41 04/27/2022   CHOLHDL 3.7 04/27/2022   VLDL 13 04/27/2022   LDLCALC 96 04/27/2022   LDLCALC 74 08/16/2021    Physical Findings:  Musculoskeletal: Strength & Muscle Tone:  Not able to assess as patient is lying in bed sleeping Gait & Station:  Not observed by this Probation officer Patient leans: N/A  Psychiatric Specialty Exam:  Presentation  General Appearance: unkempt appearing, in bed, appears stated age   Eye Contact: Minimal   Speech: Normal rate, mumbling quality at times - normal fluency   Speech Volume:Normal  Mood and Affect  Mood: "Tired" - appears aloof   Affect: constricted    Thought Process  Thought Processes:tangential with occasional derailment  Orientation:Oriented to month, year, city, and Software engineer and hospital   Thought Content:Denies ideas of reference, thought insertion/withdrawal, AVH, SI or HI - is paranoid on exam but is not grossly responding to internal stimuli - is unsure when asked about thought broadcasting   History of Schizophrenia/Schizoaffective disorder:Yes   Duration of Psychotic Symptoms:Greater than six  months   Hallucinations: Denies  Ideas of Reference:None   Suicidal Thoughts: Denies  Homicidal Thoughts: Denies   Sensorium  Memory:- Recent - Fair   Judgment:Impaired   Insight:- Poor  Executive Functions  Concentration:- Fair   [employer]:- Fair   Recall: Not formally tested   [employer] of Knowledge:Fair   Language:Fair    Psychomotor Activity  Psychomotor Activity: Sedated, in bed - no tremors or akathisias noted   Assets  Assets:Desire for Improvement; Financial Resources/Insurance; Housing; Social Support   Physical Exam Vitals and nursing note reviewed.  HENT:     Head: Normocephalic and atraumatic.  Pulmonary:     Effort: Pulmonary effort is normal.  Neurological:     General: No focal deficit present.     Mental Status: She is alert.    Review of Systems  Constitutional:  Negative for fever.  Respiratory:  Negative for shortness of breath.   Cardiovascular:  Negative for chest pain.  Gastrointestinal: Negative.  Negative for abdominal pain, constipation, diarrhea, nausea and vomiting.  Genitourinary: Negative.   Musculoskeletal:        Patient believes she is a fall risk  Neurological:  Negative for dizziness, tremors and headaches.   Blood pressure 121/85, pulse 100, temperature 98.8 F (37.1 C), temperature source Oral, resp. rate 20, height '5\' 6"'$  (1.676 m), weight 104.1 kg, SpO2 100 %. Body mass index is 37.03 kg/m.   Treatment Plan Summary: ASSESSMENT: Principal Problem:   Schizoaffective disorder, bipolar type ([place]) Active Problems:   Suicidal ideations     PLAN: Safety and Monitoring:             -- Voluntary admission to inpatient psychiatric unit for safety, stabilization and treatment (patient signed and then rescinded her 57 hour request for discharge) If she continues to refuse medications or attempts to leave will need IVC             -- Daily contact with patient to assess and evaluate symptoms and progress  in treatment             -- [name] case to be discussed in multi-disciplinary team meeting             -- Observation Level : q15 minute checks             -- Vital signs:  q12 hours             -- Precautions: suicide, elopement, and assault   2. Psychiatric Diagnoses and Treatment:  # Schizoaffective disorder, bipolar type #Suicidal ideation without plan - Decreased to Zyprexa 10 mg nightly tonight (refused dose last night) - goal of tapering off while she titrates onto Clozaril -- Loaded with [name] per [name] on 02/17/22 -- Hold Melatonin and Trazodone given oversedation concerns - Continue home Depakote ER 1000 mg nightly (refused dose last night) VPA level 62 on admission, CMP with LFTs WNL.  CBC with chronically normocytic anemia-hemoglobin 11.2, MCV 90.3, platelets 234, WBC 5.3 on 8/31 -- Continue titrating Clozaril by 12.'5mg'$  nightly - Increase to 25 mg tonight since she refused dose last night Patient informed of r/b/se including risk of agranulocytosis and need for weekly labs and amenable to medication trial - will ongoing education about medication as she continues to clear Weekly CBC, CRP, Troponin and EKG:  04/26/22 ECG unchanged and QTC 463m;  9/1 Troponin 2; 9/1 CRP 1.0; 8/31 WBC 5.9 with ANC 2700- Repeat labs and EKG on 9/7 ECHO obtained in Dec 2022 showed EF 60%, mild LV hypertrophy and Grade I diastolic dysfunction Continue Colace '100mg'$  daily and monitor bowel function -- Will need to coordinate with [name] for discharge planning    3. Medical  Diagnoses and Treatment: #Prediabetes #History of dyslipidemia - A1c 5.5 and Lipids WNL - Continue metformin 500 mg once daily for prediabetes and prevention of antipsychotic induced metabolic syndrome - Continue home atorvastatin 10 mg daily  # History of asthma - Rescue inhaler available 4 times daily as needed  #Anemia - improving (H/H 12.1/38) -- appears chronic but improving compared to previous labs - needs f/u with  PCP after discharge   4. Discharge Planning:              -- Social work and case management to assist with discharge planning and identification of hospital follow-up needs prior to discharge             -- Estimated LOS 7-10 days             -- Discharge Concerns: Need to establish a safety plan             -- Discharge Goals: Return home with outpatient referrals for mental health follow-up including medication management/psychotherapy    Harlow Asa, MD, FAPA 05/01/2022, 8:34 AM

## 2022-05-01 NOTE — Progress Notes (Signed)
   05/01/22 2200  Psych Admission Type (Psych Patients Only)  Admission Status Voluntary  Psychosocial Assessment  Patient Complaints Isolation  Eye Contact Fair  Facial Expression Animated  Affect Appropriate to circumstance  Speech Tangential  Interaction Cautious  Motor Activity Slow  Appearance/Hygiene Unremarkable  Behavior Characteristics Appropriate to situation  Mood Preoccupied  Thought Process  Coherency Tangential  Content Preoccupation  Delusions None reported or observed  Perception WDL  Hallucination None reported or observed  Judgment Poor  Confusion None  Danger to Self  Current suicidal ideation? Denies  Self-Injurious Behavior No self-injurious ideation or behavior indicators observed or expressed   Agreement Not to Harm Self Yes  Description of Agreement verbal  Danger to Others  Danger to Others None reported or observed

## 2022-05-01 NOTE — BH IP Treatment Plan (Signed)
Interdisciplinary Treatment and Diagnostic Plan Update  05/01/2022 Time of Session: Update Charday Capetillo MRN: [id number]  Principal Diagnosis: Schizoaffective disorder, bipolar type ([place])  Secondary Diagnoses: Principal Problem:   Schizoaffective disorder, bipolar type ([place]) Active Problems:   Suicidal ideations   Current Medications:  Current Facility-Administered Medications  Medication Dose Route Frequency Provider Last Rate Last Admin   acetaminophen (TYLENOL) tablet 650 mg  650 mg Oral Q6H PRN [name], NP       albuterol (VENTOLIN HFA) 108 (90 Base) MCG/ACT inhaler 2 puff  2 puff Inhalation QID PRN [name], NP       alum & mag hydroxide-simeth (MAALOX/MYLANTA) 200-200-20 MG/5ML suspension 30 mL  30 mL Oral Q4H PRN [name], NP       ascorbic acid (VITAMIN C) tablet 500 mg  500 mg Oral BID [name], NP   500 mg at 05/01/22 0802   atorvastatin (LIPITOR) tablet 10 mg  10 mg Oral Daily [name], NP   10 mg at 05/01/22 2703   cloZAPine (CLOZARIL) tablet 25 mg  25 mg Oral QHS Harlow Asa, MD       divalproex (DEPAKOTE ER) 24 hr tablet 1,000 mg  1,000 mg Oral QHS [name], NP   1,000 mg at 04/29/22 2122   docusate sodium (COLACE) capsule 100 mg  100 mg Oral Daily [name], NP   100 mg at 05/01/22 5009   feeding supplement (KATE FARMS STANDARD 1.4) liquid 325 mL  325 mL Oral BID PRN Harlow Asa, MD       hydrOXYzine (ATARAX) tablet 25 mg  25 mg Oral TID PRN Harlow Asa, MD       OLANZapine zydis (ZYPREXA) disintegrating tablet 5 mg  5 mg Oral Q8H PRN Harlow Asa, MD       And   LORazepam (ATIVAN) tablet 1 mg  1 mg Oral PRN Harlow Asa, MD       And   ziprasidone (GEODON) injection 20 mg  20 mg Intramuscular PRN Harlow Asa, MD       magnesium hydroxide (MILK OF MAGNESIA) suspension 30 mL  30 mL Oral Daily PRN [name], NP       metFORMIN (GLUCOPHAGE) tablet 500 mg  500 mg Oral Q breakfast [name], NP   500 mg at  05/01/22 0803   OLANZapine (ZYPREXA) tablet 10 mg  10 mg Oral QHS Harlow Asa, MD       PTA Medications: Medications Prior to Admission  Medication Sig Dispense Refill Last Dose   albuterol (VENTOLIN HFA) 108 (90 Base) MCG/ACT inhaler Inhale 2 puffs into the lungs 4 (four) times daily as needed for wheezing or shortness of breath.      atorvastatin (LIPITOR) 10 MG tablet Take 1 tablet (10 mg total) by mouth daily. (Patient taking differently: Take 10 mg by mouth at bedtime.) 30 tablet 0    divalproex (DEPAKOTE ER) 500 MG 24 hr tablet Take 2 tablets (1,000 mg total) by mouth at bedtime. 60 tablet 0    OLANZapine (ZYPREXA) 10 MG tablet Take 10 mg by mouth at bedtime.      paliperidone Palmitate ER (INVEGA TRINZA) 819 MG/2.63ML injection Inject 819 mg into the muscle every 3 (three) months. +/- 14 days of due date       Patient Stressors: Medication change or noncompliance    Patient Strengths: Capable of independent living  General fund of knowledge  Supportive family/friends  Treatment Modalities: Medication Management, Group therapy, Case management,  1 to 1 session with clinician, Psychoeducation, Recreational therapy.   Physician Treatment Plan for Primary Diagnosis: Schizoaffective disorder, bipolar type ([place]) Long Term Goal(s): Improvement in symptoms so as ready for discharge   Short Term Goals: Ability to identify changes in lifestyle to reduce recurrence of condition will improve Ability to verbalize feelings will improve Ability to disclose and discuss suicidal ideas Ability to demonstrate self-control will improve Ability to identify and develop effective coping behaviors will improve Ability to maintain clinical measurements within normal limits will improve Compliance with prescribed medications will improve  Medication Management: Evaluate patient's response, side effects, and tolerance of medication regimen.  Therapeutic Interventions: 1 to 1 sessions, Unit  Group sessions and Medication administration.  Evaluation of Outcomes: Progressing  Physician Treatment Plan for Secondary Diagnosis: Principal Problem:   Schizoaffective disorder, bipolar type ([place]) Active Problems:   Suicidal ideations  Long Term Goal(s): Improvement in symptoms so as ready for discharge   Short Term Goals: Ability to identify changes in lifestyle to reduce recurrence of condition will improve Ability to verbalize feelings will improve Ability to disclose and discuss suicidal ideas Ability to demonstrate self-control will improve Ability to identify and develop effective coping behaviors will improve Ability to maintain clinical measurements within normal limits will improve Compliance with prescribed medications will improve     Medication Management: Evaluate patient's response, side effects, and tolerance of medication regimen.  Therapeutic Interventions: 1 to 1 sessions, Unit Group sessions and Medication administration.  Evaluation of Outcomes: Not Progressing   RN Treatment Plan for Primary Diagnosis: Schizoaffective disorder, bipolar type ([place]) Long Term Goal(s): Knowledge of disease and therapeutic regimen to maintain health will improve  Short Term Goals: Ability to remain free from injury will improve, Ability to verbalize frustration and anger appropriately will improve, Ability to demonstrate self-control, Ability to participate in decision making will improve, Ability to verbalize feelings will improve, Ability to disclose and discuss suicidal ideas, Ability to identify and develop effective coping behaviors will improve, and Compliance with prescribed medications will improve  Medication Management: RN will administer medications as ordered by provider, will assess and evaluate patient's response and provide education to patient for prescribed medication. RN will report any adverse and/or side effects to prescribing provider.  Therapeutic  Interventions: 1 on 1 counseling sessions, Psychoeducation, Medication administration, Evaluate responses to treatment, Monitor vital signs and CBGs as ordered, Perform/monitor CIWA, COWS, AIMS and Fall Risk screenings as ordered, Perform wound care treatments as ordered.  Evaluation of Outcomes: Progressing   LCSW Treatment Plan for Primary Diagnosis: Schizoaffective disorder, bipolar type ([place]) Long Term Goal(s): Safe transition to appropriate next level of care at discharge, Engage patient in therapeutic group addressing interpersonal concerns.  Short Term Goals: Engage patient in aftercare planning with referrals and resources, Increase social support, Increase ability to appropriately verbalize feelings, Increase emotional regulation, Facilitate acceptance of mental health diagnosis and concerns, Facilitate patient progression through stages of change regarding substance use diagnoses and concerns, Identify triggers associated with mental health/substance abuse issues, and Increase skills for wellness and recovery  Therapeutic Interventions: Assess for all discharge needs, 1 to 1 time with Social worker, Explore available resources and support systems, Assess for adequacy in community support network, Educate family and significant other(s) on suicide prevention, Complete Psychosocial Assessment, Interpersonal group therapy.  Evaluation of Outcomes: Progressing   Progress in Treatment: Attending groups: No. Participating in groups:No Taking medication as prescribed: Yes. Refused Depakote on  04-30-2022 Toleration medication: Yes. Family/Significant other contact made: Yes, individual(s) contacted:  Daughter Patient understands diagnosis: Yes. Discussing patient identified problems/goals with staff: Yes. Medical problems stabilized or resolved: Yes. Denies suicidal/homicidal ideation: Yes. Issues/concerns per patient self-inventory: Yes.   New problem(s) identified: No, Describe:  None  reported  New Short Term/Long Term Goal(s): detox, medication management for mood stabilization; elimination of SI thoughts; development of comprehensive mental wellness/sobriety plan.      Patient Goals: Patient desires to continue working on initial team goals, CSM will provide support if patient expresses concerns  Discharge Plan or Barriers: Patient is connected with [name] ([facility]) . Patient plans to return to her apartment once discharged.   Reason for Continuation of Hospitalization: Delusions  Depression Suicidal ideation  Estimated Length of Stay: 3-5  Last 3 [place] Suicide Severity Risk Score: [place] Admission (Current) from 04/26/2022 in [facility] ED from 04/25/2022 in [facility] DEPT Pre-admit (Canceled) from 12/19/2018 in [facility]  C-SSRS RISK CATEGORY Moderate Risk Moderate Risk No Risk       Last PHQ 2/9 Scores:    02/23/2021    8:30 AM 09/16/2020    1:00 PM 10/24/2016    3:24 PM  Depression screen PHQ 2/9  Decreased Interest 1 0 1  Down, Depressed, Hopeless 1 0 1  PHQ - 2 Score 2 0 2  Altered sleeping '2 1 3  '$ Tired, decreased energy 0 0 1  Change in appetite 0 0 0  Feeling bad or failure about yourself  1 0 1  Trouble concentrating 0 0 0  Moving slowly or fidgety/restless 0 0 3  Suicidal thoughts 1 0 1  PHQ-9 Score '6 1 11    '$ Scribe for Treatment Team: Alayzia Pavlock S. Caedmon Louque LCSW 05/01/2022 11:59 AM

## 2022-05-01 NOTE — Progress Notes (Signed)
Pt in room this morning, does come on unit for scheduled activities with prompts from staff. Pt noted with some disorganization and tangential speech. Pt verbalized understanding of importance of medication compliance. Pt denied a/v hallucinations as well as SI/HI/self harm thoughts. Q 15 minute checks ongoing for safety.

## 2022-05-01 NOTE — Progress Notes (Signed)
Pt refused her night time medication stating she that she has a right to refuse medication. Pt appeared disorganized and confused. Pt educated on the risk of stopping medication without talking to the doctor, pt was encouraged  to talk to the doctor in the morning about her medications, will continue to monitor.

## 2022-05-01 NOTE — Progress Notes (Signed)
Adult Psychoeducational Group Note  Date:  05/01/2022 Time:  6:56 PM  Pt did not attend the goals group.

## 2022-05-02 DIAGNOSIS — F250 Schizoaffective disorder, bipolar type: Secondary | ICD-10-CM | POA: Diagnosis not present

## 2022-05-02 MED ORDER — CLOZAPINE 25 MG PO TABS
37.5000 mg | ORAL_TABLET | Freq: Two times a day (BID) | ORAL | Status: AC
  Administered 2022-05-04 – 2022-05-05 (×2): 37.5 mg via ORAL
  Filled 2022-05-02 (×2): qty 2

## 2022-05-02 MED ORDER — CLOZAPINE 25 MG PO TABS
25.0000 mg | ORAL_TABLET | Freq: Every day | ORAL | Status: AC
Start: 2022-05-02 — End: 2022-05-02
  Administered 2022-05-02: 25 mg via ORAL
  Filled 2022-05-02 (×2): qty 1

## 2022-05-02 MED ORDER — CLOZAPINE 25 MG PO TABS
50.0000 mg | ORAL_TABLET | Freq: Two times a day (BID) | ORAL | Status: DC
Start: 2022-05-05 — End: 2022-05-10
  Administered 2022-05-05 – 2022-05-10 (×10): 50 mg via ORAL
  Filled 2022-05-02: qty 28
  Filled 2022-05-02: qty 2
  Filled 2022-05-02: qty 28
  Filled 2022-05-02 (×11): qty 2

## 2022-05-02 MED ORDER — CLOZAPINE 25 MG PO TABS
25.0000 mg | ORAL_TABLET | Freq: Two times a day (BID) | ORAL | Status: AC
  Administered 2022-05-03 – 2022-05-04 (×2): 25 mg via ORAL
  Filled 2022-05-02 (×2): qty 1

## 2022-05-02 NOTE — Progress Notes (Signed)
[facility] MD Progress Note  05/02/2022 2:16 PM Deshannon Seide  MRN:  [id number]   Reason for Admission:  Prisila Dlouhy is a 51 y.o. female with a history of  with a psychiatric history of schizoaffective disorder-bipolar type who presented voluntarily from [facility] and was admitted for disorganized thoughts, SI, and paranoia.The patient is currently on Hospital Day 6.   Chart Review from last 24 hours:  The patient's chart was reviewed and nursing notes were reviewed. The patient's case was discussed in multidisciplinary team meeting. Per [facility] she received 1 dose of clozapine 25 mg at bedtime last night, compliant with Depakote ER 1000 mg at bedtime, currently on Zyprexa 10 mg at bedtime.  Orders were entered for clozapine 1 dose only last night.  No as needed medication needed or given for anxiety or agitation.  Information Obtained Today During Patient Interview: The patient was seen and evaluated on the unit. On assessment today the patient reports she is admitted because "anxiety and depression I was afraid to hurt myself" she tells me since admission she feels a lot better and that her medications are helpful she complains of side effect including dry mouth otherwise denies any she denies any SI since admitted she denies HI or AVH she does not present overtly psychotic.  She tells me she lives in an apartment by herself and she has ACT team following her outpatient.  Chart review indicates when was here before she was stabilized and discharged on Invega Sustenna and clozapine 100 mg daily but clozapine was discontinued on outpatient basis then she received [name] in June then decompensated and got hospitalized last month for worsening mood and psychosis.  I attempted to contact ACT team at 2 phone numbers provided [phone] and [phone] with no response but left voicemail. At this time we will continue to attempt to obtain medications records and last time got [place] and if responded well  to it by self may start her back on Invega orally and then sustained, will follow.   Sleep  Sleep: Good per patient's report  Principal Problem: Schizoaffective disorder, bipolar type ([place]) Diagnosis: Principal Problem:   Schizoaffective disorder, bipolar type ([place]) Active Problems:   Suicidal ideations    Past Psychiatric History: Previous Psych Diagnoses: Schizoaffective- BP type Prior inpatient treatment: [facility] 02/2022; [facility] 09/27/19, 07/2021; July 2020 [place] Current/prior outpatient treatment: [name]  Prior rehab hx: Denies Psychotherapy hx: Denies History of suicide: Reports suicide attempt in college by cutting wrists History of homicide: Denies Psychiatric medication history: Prev trials- Depakote ER, Risperdal (helped to calm her), Abilify PO and LAI, PO paliperidone while titrating LAI; haldol (allergies), prolixin, invega , zyprexa, seroquel, Clozaril (unsure of compliance with regimen) Neuromodulation history: ECT in 1993 Current Psychiatrist: [facility] Current therapist: Denies  Past Medical History:  Past Medical History:  Diagnosis Date   Anxiety    Asthma    Bipolar 1 disorder ([place])    Depression    Gallstones 08/2020   Hypertension    Insomnia, persistent    Prediabetes    Schizophrenic disorder ([place])    Seizures ([place])     Past Surgical History:  Procedure Laterality Date   BREAST LUMPECTOMY Left 07/2013   TONSILLECTOMY     TUBAL LIGATION  2002   Family History:  Family History  Problem Relation Age of Onset   Depression Mother    Gout Mother    Cancer Father        prostate   Other  Father        lung issue   Alcoholism Other    Heart attack Paternal Grandfather    Heart attack Paternal Grandmother    Heart attack Maternal Grandmother    Heart attack Maternal Grandfather    Depression Son    Anxiety disorder Son    Family Psychiatric  History: Psych: Mother and maternal grandmother with schizophrenia, dad schizoaffective  disorder depressive type Psych Rx: Unknown SA/HA: Uncle completed suicide Substance use family hx: Denies Social History: See H&P  Current Medications: Current Facility-Administered Medications  Medication Dose Route Frequency Provider Last Rate Last Admin   acetaminophen (TYLENOL) tablet 650 mg  650 mg Oral Q6H PRN [name], NP       albuterol (VENTOLIN HFA) 108 (90 Base) MCG/ACT inhaler 2 puff  2 puff Inhalation QID PRN [name], NP       alum & mag hydroxide-simeth (MAALOX/MYLANTA) 200-200-20 MG/5ML suspension 30 mL  30 mL Oral Q4H PRN [name], NP       ascorbic acid (VITAMIN C) tablet 500 mg  500 mg Oral BID [name], NP   500 mg at 05/02/22 0839   atorvastatin (LIPITOR) tablet 10 mg  10 mg Oral Daily [name], NP   10 mg at 05/02/22 0839   divalproex (DEPAKOTE ER) 24 hr tablet 1,000 mg  1,000 mg Oral QHS [name], NP   1,000 mg at 05/01/22 2113   docusate sodium (COLACE) capsule 100 mg  100 mg Oral Daily [name], NP   100 mg at 05/02/22 6967   feeding supplement (KATE FARMS STANDARD 1.4) liquid 325 mL  325 mL Oral BID PRN Harlow Asa, MD       hydrOXYzine (ATARAX) tablet 25 mg  25 mg Oral TID PRN Harlow Asa, MD       OLANZapine zydis (ZYPREXA) disintegrating tablet 5 mg  5 mg Oral Q8H PRN Harlow Asa, MD       And   LORazepam (ATIVAN) tablet 1 mg  1 mg Oral PRN Harlow Asa, MD       And   ziprasidone (GEODON) injection 20 mg  20 mg Intramuscular PRN Nelda Marseille, Amy E, MD       magnesium hydroxide (MILK OF MAGNESIA) suspension 30 mL  30 mL Oral Daily PRN [name], NP       metFORMIN (GLUCOPHAGE) tablet 500 mg  500 mg Oral Q breakfast [name], NP   500 mg at 05/02/22 0839   OLANZapine (ZYPREXA) tablet 10 mg  10 mg Oral QHS Harlow Asa, MD   10 mg at 05/01/22 2113    Lab Results: No results found for this or any previous visit (from the past 48 hour(s)).  Blood Alcohol level:  Lab Results  Component Value Date    ETH <10 04/25/2022   ETH <10 89/38/1017    Metabolic Disorder Labs: Lab Results  Component Value Date   HGBA1C 5.5 04/27/2022   MPG 111.15 04/27/2022   MPG 119.76 08/12/2021   Lab Results  Component Value Date   PROLACTIN 74.6 (H) 09/28/2019   PROLACTIN 4.2 (L) 12/19/2018   Lab Results  Component Value Date   CHOL 150 04/27/2022   TRIG 66 04/27/2022   HDL 41 04/27/2022   CHOLHDL 3.7 04/27/2022   VLDL 13 04/27/2022   LDLCALC 96 04/27/2022   LDLCALC 74 08/16/2021    Physical Findings: AIMS: Facial and Oral Movements Muscles of Facial Expression: None, normal  Lips and Perioral Area: None, normal Jaw: None, normal Tongue: None, normal,Extremity Movements Upper (arms, wrists, hands, fingers): None, normal Lower (legs, knees, ankles, toes): None, normal, Trunk Movements Neck, shoulders, hips: None, normal, Overall Severity Severity of abnormal movements (highest score from questions above): None, normal Incapacitation due to abnormal movements: None, normal [name] awareness of abnormal movements (rate only patient's report): No Awareness, Dental Status Current problems with teeth and/or dentures?: No Does patient usually wear dentures?: No  CIWA:    COWS:     Musculoskeletal: Strength & Muscle Tone: within normal limits Gait & Station: normal Patient leans: N/A  Psychiatric Specialty Exam:  General Appearance: Unkempt, below average hygiene, appears at stated age  Behavior: Cooperative in general  Psychomotor Activity:No psychomotor agitation or retardation noted   Eye Contact: Limited Speech: Normal amount, tone and volume   Mood: Euthymic Affect: Restricted  Thought Process: Circumstantial and vague but able to answer questions Descriptions of Associations: Concrete Thought Content: Hallucinations: Denies AH, VH, does not present responding to stimuli Delusions: No paranoia noted during evaluation Suicidal Thoughts: Denied eyes SI, intention,  plan  Homicidal Thoughts: Denies HI, intention, plan   Alertness/Orientation: Alert and oriented except confused about the day of the week  Insight: Limited Judgment: Limited  Memory: [place]  Community education officer  Concentration: Limited, easily distractible Attention Span: Decreased Recall: limited Fund of Knowledge: limited   Assets  Assets:Desire for Improvement; Financial Resources/Insurance; Housing; Social Support    Physical Exam: Physical Exam Review of Systems  All other systems reviewed and are negative.  Blood pressure 122/74, pulse 89, temperature 97.7 F (36.5 C), temperature source Oral, resp. rate 20, height '5\' 6"'$  (1.676 m), weight 104.1 kg, SpO2 100 %. Body mass index is 37.03 kg/m.   Treatment Plan Summary:  ASSESSMENT:  Diagnoses / Active Problems: Principal Problem: Schizoaffective disorder, bipolar type ([place]) Diagnosis: Principal Problem:   Schizoaffective disorder, bipolar type ([place]) Active Problems:   Suicidal ideations   PLAN: Safety and Monitoring:  -- Voluntary admission to inpatient psychiatric unit for safety, stabilization and treatment  -- Daily contact with patient to assess and evaluate symptoms and progress in treatment  -- [name] case to be discussed in multi-disciplinary team meeting  -- Observation Level : q15 minute checks  -- Vital signs:  q12 hours  -- Precautions: suicide, elopement, and assault  2. Medications:   Continue Clozaril 25 mg nightly and continue to titrate  Continue Zyprexa 10 mg at night with possible plan to DC and probably replace with Invega  Continue to attempt communicating with ACT team staff provider to address if responded well to [place] may put her back on Invega p.o. and monthly injection, will follow.  Continue Depakote 1000 mg ER at bedtime for mood stabilization  Continue Lipitor 10 mg daily for hyperlipidemia  Continue Glucophage 500 mg daily with breakfast for diabetes  The  risks/benefits/side-effects/alternatives to this medication were discussed in detail with the patient and time was given for questions. The patient consents to medication trial.    -- Metabolic profile and EKG monitoring obtained while on an atypical antipsychotic (BMI: Lipid Panel: HbgA1c: QTc:)      3. Pertinent labs: BMP no significant abnormalities noted, lipid profile within normal limits, CBC no significant abnormalities noted, Depakote level 62 8/31, hemoglobin A1c 5.5, TSH within normal limits, UDS negative at time of admission      Lab ordered: None at this time  4. Group and Therapy: -- Encouraged patient to participate  in unit milieu and in scheduled group therapies       -- Short Term Goals: Ability to identify changes in lifestyle to reduce recurrence of condition will improve, Ability to verbalize feelings will improve, Ability to disclose and discuss suicidal ideas, Ability to demonstrate self-control will improve, and Ability to identify and develop effective coping behaviors will improve  -- Long Term Goals: Improvement in symptoms so as ready for discharge  5. Discharge Planning:   -- Social work and case management to assist with discharge planning and identification of hospital follow-up needs prior to discharge  -- Estimated LOS: 5-7 days  -- Discharge Concerns: Need to establish a safety plan; Medication compliance and effectiveness  -- Discharge Goals: Return home with outpatient referrals for mental health follow-up including medication management/psychotherapy      Total Time Spent in Direct Patient Care:  I personally spent 35 minutes on the unit in direct patient care. The direct patient care time included face-to-face time with the patient, reviewing the patient's chart, communicating with other professionals, and coordinating care. Greater than 50% of this time was spent in counseling or coordinating care with the patient regarding goals of hospitalization,  psycho-education, and discharge planning needs.   Elius Etheredge Winfred Leeds, MD 05/02/2022, 2:16 PM

## 2022-05-02 NOTE — Progress Notes (Signed)
Adult Psychoeducational Group Note  Date:  05/02/2022 Time:  10:36 PM  Group Topic/Focus:  Wrap-Up Group:   The focus of this group is to help patients review their daily goal of treatment and discuss progress on daily workbooks.  Participation Level:  Active  Participation Quality:  Appropriate  Affect:  Appropriate  Cognitive:  Disorganized  Insight: Lacking  Engagement in Group:  Engaged  Modes of Intervention:  Discussion  Additional Comments:    Pt attended and actively participated in the [place] group. Pt denies HI/SI. Pt rates her day/mood 5/10 "my equilibrium" Pt did not disclose regarding her goal or progress made. Pt thoughts  were disorganized and contribution to the group was positive yet off topic. Pt reports having a good and positive day.  [name] 05/02/2022, 10:36 PM

## 2022-05-02 NOTE — BHH Counselor (Signed)
CSW spoke with [facility] [phone] and was informed that the Pt will be returning to her apartment after discharge.  CSW was also informed that the ACT Team will come to the Pt's apartment on the date of discharge and will continue to come daily for 3 days after discharge.  The ACT Team does not recommend any additional services at this time.  CSW will inform the providers of this information.

## 2022-05-02 NOTE — Progress Notes (Signed)
   05/02/22 1047  SDOH Interventions  Food Insecurity Interventions Inpatient TOC

## 2022-05-02 NOTE — Group Note (Signed)
Recreation Therapy Group Note   Group Topic:Team Building  Group Date: 05/02/2022 Start Time: 1000 End Time: 1030 Facilitators: Victorino Sparrow, LRT,CTRS Location: [address] Dayroom   Goal Area(s) Addresses:  Patient will effectively work with peer towards shared goal.  Patient will identify skills used to make activity successful.  Patient will identify how skills used during activity can be applied to reach post d/c goals.    Group Description: [employer]. In teams of 5-6, patients were given 11 craft pipe cleaners. Using the materials provided, patients were instructed to compete again the opposing team(s) to build the tallest free-standing structure from floor level. The activity was timed; difficulty increased by Probation officer as Pharmacist, hospital continued.  Systematically resources were removed with additional directions for example, placing one arm behind their back, working in silence, and shape stipulations. LRT facilitated post-activity discussion reviewing team processes and necessary communication skills involved in completion. Patients were encouraged to reflect how the skills utilized, or not utilized, in this activity can be incorporated to positively impact support systems post discharge.   Affect/Mood: N/A   Participation Level: Did not attend    Clinical Observations/Individualized Feedback:     Plan: Continue to engage patient in RT group sessions 2-3x/week.   Victorino Sparrow, LRT,CTRS 05/02/2022 12:14 PM

## 2022-05-03 DIAGNOSIS — F250 Schizoaffective disorder, bipolar type: Secondary | ICD-10-CM | POA: Diagnosis not present

## 2022-05-03 MED ORDER — PALIPERIDONE ER 6 MG PO TB24
6.0000 mg | ORAL_TABLET | Freq: Every day | ORAL | Status: DC
  Administered 2022-05-03 – 2022-05-09 (×7): 6 mg via ORAL
  Filled 2022-05-03 (×8): qty 1

## 2022-05-03 NOTE — Progress Notes (Addendum)
[facility] MD Progress Note  05/03/2022 12:18 PM Jariya Reichow  MRN:  [id number]   Reason for Admission:  [name] is a 51 y.o. female with a history of  with a psychiatric history of schizoaffective disorder-bipolar type who presented voluntarily from [facility] and was admitted for disorganized thoughts, SI, and paranoia.The patient is currently on Hospital Day 7.   Chart Review from last 24 hours:  The patient's chart was reviewed and nursing notes were reviewed. The patient's case was discussed in multidisciplinary team meeting. Per [facility] she received 1 dose of clozapine 25 mg at bedtime last night, compliant with Depakote ER 1000 mg at bedtime, currently on Zyprexa 10 mg at bedtime.  Orders were entered for clozapine 1 dose only last night.  No as needed medication needed or given for anxiety or agitation.  Per staff patient seems to be linear and organized at times but at other times noted to be disorganized in her thoughts in fact this morning she was reported that she noted to staff that she is tired from doctors here trying to "transgenderise" her which seems was some report of it earlier during hospitalization being paranoid from providers on the ACT team but now she is reporting from providers here, will follow.  Information Obtained Today During Patient Interview: The patient was seen and evaluated on the unit.  She presents linear during my evaluation reports mood as all right denying depressed mood or anxiety reports she slept well, reports good appetite and denies SI HI or AVH, noting able to think clearly, during evaluation she was not noted to be responding to stimuli and she denies to me any paranoia from staff here or outside the hospital but she reported paranoia to staff RN after this evaluation as noted above.  Chart review indicates when was here before she was stabilized and discharged on Invega Sustenna and clozapine 100 mg daily but clozapine was discontinued on outpatient basis then she  received [name] in June then decompensated and got hospitalized last month for worsening mood and psychosis.  I attempted to contact ACT team again today and left a message for provider Dr. Dora Sims, was informed by receptionist to expect a call back from him, discussed with receptionist purpose of the call is to coordinate medication treatment and plan of care.    At this time we will continue to attempt to obtain medications records and last time got [place] and if responded well to it by itself or with Clozaril, may start her back on Invega orally and then monthly injection, will follow.  Addendum: Later in the day I received a phone call from ACT team psychiatrist Dr. Dora Sims, discussed the patient's care including recent medication regimen and history of being stabilized on Invega monthly injection and clozapine 100 mg daily, Dr. Tamala Julian agrees that [name] was apparently not sufficient to last patient is a duration of 3 months leading to recent decompensation and psychosis.  After careful lengthy discussion of patient's care, decision was made to switch nightly dose of Zyprexa with Invega orally and titrated up gradually to monitor efficacy and safety in combination with clozapine titration.  If this regimen is effective will start her on [place] prior to discharge, will follow. Sleep  Sleep: Good per patient's report  Principal Problem: Schizoaffective disorder, bipolar type ([place]) Diagnosis: Principal Problem:   Schizoaffective disorder, bipolar type ([place]) Active Problems:   Suicidal ideations    Past Psychiatric History: Previous Psych Diagnoses: Schizoaffective- BP  type Prior inpatient treatment: [facility] 02/2022; [facility] 09/27/19, 07/2021; July 2020 [place] Current/prior outpatient treatment: [name]  Prior rehab hx: Denies Psychotherapy hx: Denies History of suicide: Reports suicide attempt in college by cutting wrists History of  homicide: Denies Psychiatric medication history: Prev trials- Depakote ER, Risperdal (helped to calm her), Abilify PO and LAI, PO paliperidone while titrating LAI; haldol (allergies), prolixin, invega , zyprexa, seroquel, Clozaril (unsure of compliance with regimen) Neuromodulation history: ECT in 1993 Current Psychiatrist: [place] Current therapist: Denies  Past Medical History:  Past Medical History:  Diagnosis Date   Anxiety    Asthma    Bipolar 1 disorder ([place])    Depression    Gallstones 08/2020   Hypertension    Insomnia, persistent    Prediabetes    Schizophrenic disorder ([place])    Seizures ([place])     Past Surgical History:  Procedure Laterality Date   BREAST LUMPECTOMY Left 07/2013   TONSILLECTOMY     TUBAL LIGATION  2002   Family History:  Family History  Problem Relation Age of Onset   Depression Mother    Gout Mother    Cancer Father        prostate   Other Father        lung issue   Alcoholism Other    Heart attack Paternal Grandfather    Heart attack Paternal Grandmother    Heart attack Maternal Grandmother    Heart attack Maternal Grandfather    Depression Son    Anxiety disorder Son    Family Psychiatric  History: Psych: Mother and maternal grandmother with schizophrenia, dad schizoaffective disorder depressive type Psych Rx: Unknown SA/HA: Uncle completed suicide Substance use family hx: Denies Social History: See H&P  Current Medications: Current Facility-Administered Medications  Medication Dose Route Frequency Provider Last Rate Last Admin   acetaminophen (TYLENOL) tablet 650 mg  650 mg Oral Q6H PRN [name], NP       albuterol (VENTOLIN HFA) 108 (90 Base) MCG/ACT inhaler 2 puff  2 puff Inhalation QID PRN [name], NP       alum & mag hydroxide-simeth (MAALOX/MYLANTA) 200-200-20 MG/5ML suspension 30 mL  30 mL Oral Q4H PRN [name], NP       ascorbic acid (VITAMIN C) tablet 500 mg  500 mg Oral BID [name], NP   500 mg at  05/03/22 0841   atorvastatin (LIPITOR) tablet 10 mg  10 mg Oral Daily [name], NP   10 mg at 05/03/22 0840   cloZAPine (CLOZARIL) tablet 25 mg  25 mg Oral BID Dian Situ, MD       Followed by   Derrill Memo ON 05/04/2022] cloZAPine (CLOZARIL) tablet 37.5 mg  37.5 mg Oral BID Winfred Leeds, Brandonn Capelli, MD       Followed by   Derrill Memo ON 05/05/2022] cloZAPine (CLOZARIL) tablet 50 mg  50 mg Oral BID Winfred Leeds, Charrie Mcconnon, MD       divalproex (DEPAKOTE ER) 24 hr tablet 1,000 mg  1,000 mg Oral QHS [name], NP   1,000 mg at 05/02/22 2050   docusate sodium (COLACE) capsule 100 mg  100 mg Oral Daily [name], NP   100 mg at 05/03/22 0841   feeding supplement (KATE FARMS STANDARD 1.4) liquid 325 mL  325 mL Oral BID PRN Harlow Asa, MD       hydrOXYzine (ATARAX) tablet 25 mg  25 mg Oral TID PRN Harlow Asa, MD       OLANZapine  zydis (ZYPREXA) disintegrating tablet 5 mg  5 mg Oral Q8H PRN Harlow Asa, MD       And   LORazepam (ATIVAN) tablet 1 mg  1 mg Oral PRN Harlow Asa, MD       And   ziprasidone (GEODON) injection 20 mg  20 mg Intramuscular PRN Nelda Marseille, Amy E, MD       magnesium hydroxide (MILK OF MAGNESIA) suspension 30 mL  30 mL Oral Daily PRN [name], NP       metFORMIN (GLUCOPHAGE) tablet 500 mg  500 mg Oral Q breakfast [name], NP   500 mg at 05/03/22 0841   OLANZapine (ZYPREXA) tablet 10 mg  10 mg Oral QHS Harlow Asa, MD   10 mg at 05/02/22 2050    Lab Results: No results found for this or any previous visit (from the past 48 hour(s)).  Blood Alcohol level:  Lab Results  Component Value Date   ETH <10 04/25/2022   ETH <10 01/74/9449    Metabolic Disorder Labs: Lab Results  Component Value Date   HGBA1C 5.5 04/27/2022   MPG 111.15 04/27/2022   MPG 119.76 08/12/2021   Lab Results  Component Value Date   PROLACTIN 74.6 (H) 09/28/2019   PROLACTIN 4.2 (L) 12/19/2018   Lab Results  Component Value Date   CHOL 150 04/27/2022   TRIG 66 04/27/2022    HDL 41 04/27/2022   CHOLHDL 3.7 04/27/2022   VLDL 13 04/27/2022   LDLCALC 96 04/27/2022   LDLCALC 74 08/16/2021    Physical Findings: AIMS: Facial and Oral Movements Muscles of Facial Expression: None, normal Lips and Perioral Area: None, normal Jaw: None, normal Tongue: None, normal,Extremity Movements Upper (arms, wrists, hands, fingers): None, normal Lower (legs, knees, ankles, toes): None, normal, Trunk Movements Neck, shoulders, hips: None, normal, Overall Severity Severity of abnormal movements (highest score from questions above): None, normal Incapacitation due to abnormal movements: None, normal [name] awareness of abnormal movements (rate only patient's report): No Awareness, Dental Status Current problems with teeth and/or dentures?: No Does patient usually wear dentures?: No  CIWA:    COWS:     Musculoskeletal: Strength & Muscle Tone: within normal limits Gait & Station: normal Patient leans: N/A  Psychiatric Specialty Exam:  General Appearance: Unkempt, below average hygiene, appears at stated age  Behavior: Cooperative in general  Psychomotor Activity:No psychomotor agitation or retardation noted   Eye Contact: Limited Speech: Normal amount, tone and volume   Mood: Euthymic Affect: Restricted  Thought Process: Circumstantial and vague but able to answer questions Descriptions of Associations: Concrete Thought Content: Hallucinations: Denies AH, VH, does not present responding to stimuli Delusions: No paranoia noted during evaluation Suicidal Thoughts: Denied eyes SI, intention, plan  Homicidal Thoughts: Denies HI, intention, plan   Alertness/Orientation: Alert and oriented except confused about the day of the week  Insight: Limited Judgment: Limited  Memory: [place]  Community education officer  Concentration: Limited, easily distractible Attention Span: Decreased Recall: limited Fund of Knowledge: limited   Assets  Assets:Desire for  Improvement; Financial Resources/Insurance; Housing; Social Support    Physical Exam: Physical Exam Vitals and nursing note reviewed.    Review of Systems  All other systems reviewed and are negative.  Blood pressure 116/79, pulse 97, temperature 98.8 F (37.1 C), temperature source Oral, resp. rate 20, height '5\' 6"'$  (1.676 m), weight 104.1 kg, SpO2 98 %. Body mass index is 37.03 kg/m.   Treatment Plan Summary:  ASSESSMENT:  Diagnoses /  Active Problems: Principal Problem: Schizoaffective disorder, bipolar type ([place]) Diagnosis: Principal Problem:   Schizoaffective disorder, bipolar type ([place]) Active Problems:   Suicidal ideations   PLAN: Safety and Monitoring:  -- Voluntary admission to inpatient psychiatric unit for safety, stabilization and treatment  -- Daily contact with patient to assess and evaluate symptoms and progress in treatment  -- [name] case to be discussed in multi-disciplinary team meeting  -- Observation Level : q15 minute checks  -- Vital signs:  q12 hours  -- Precautions: suicide, elopement, and assault  2. Medications:   Continue Clozaril titration, currently on 25 mg twice daily  Discontinue Zyprexa for lack of efficacy Start Invega 6 mg p.o. at bedtime for psychosis Consider to start Invega sustena injection if Invega orally is effective in combination with clozapine  Continue Depakote 1000 mg ER at bedtime for mood stabilization  Continue Lipitor 10 mg daily for hyperlipidemia  Continue Glucophage 500 mg daily with breakfast for diabetes  The risks/benefits/side-effects/alternatives to this medication were discussed in detail with the patient and time was given for questions. The patient consents to medication trial.    -- Metabolic profile and EKG monitoring obtained while on an atypical antipsychotic (BMI: Lipid Panel: HbgA1c: QTc:)      3. Pertinent labs: BMP no significant abnormalities noted, lipid profile within normal limits, CBC no  significant abnormalities noted, Depakote level 62 8/31, hemoglobin A1c 5.5, TSH within normal limits, UDS negative at time of admission      Lab ordered: None at this time  4. Group and Therapy: -- Encouraged patient to participate in unit milieu and in scheduled group therapies       -- Short Term Goals: Ability to identify changes in lifestyle to reduce recurrence of condition will improve, Ability to verbalize feelings will improve, Ability to disclose and discuss suicidal ideas, Ability to demonstrate self-control will improve, and Ability to identify and develop effective coping behaviors will improve  -- Long Term Goals: Improvement in symptoms so as ready for discharge  5. Discharge Planning:   -- Social work and case management to assist with discharge planning and identification of hospital follow-up needs prior to discharge  -- Estimated LOS: 5-7 days  -- Discharge Concerns: Need to establish a safety plan; Medication compliance and effectiveness  -- Discharge Goals: Return home with outpatient referrals for mental health follow-up including medication management/psychotherapy      Total Time Spent in Direct Patient Care:  I personally spent 35 minutes on the unit in direct patient care. The direct patient care time included face-to-face time with the patient, reviewing the patient's chart, communicating with other professionals, and coordinating care. Greater than 50% of this time was spent in counseling or coordinating care with the patient regarding goals of hospitalization, psycho-education, and discharge planning needs.   Brookelynn Hamor Winfred Leeds, MD 05/03/2022, 12:18 PM

## 2022-05-03 NOTE — [facility] Group Notes (Signed)
Spirituality group facilitated by Kathrynn Humble, [place].   Group Description: Group focused on topic of hope. Patients participated in facilitated discussion around topic, connecting with one another around experiences and definitions for hope. Group members engaged with visual explorer photos, reflecting on what hope looks like for them today. Group engaged in discussion around how their definitions of hope are present today in hospital.   Modalities: Psycho-social ed, Adlerian, Narrative, MI   Patient Progress: [name] attended group and actively participated in the group conversation.  She mentioned gratitude as a way of holding hope and strength.  She brought up her faith several times.  At times her comments were tangential.  Kathrynn Humble, [place] PAger, [phone]

## 2022-05-03 NOTE — Plan of Care (Signed)
  Problem: Education: Goal: Emotional status will improve Outcome: Progressing   Problem: Activity: Goal: Interest or engagement in activities will improve Outcome: Progressing   Problem: Coping: Goal: Ability to demonstrate self-control will improve Outcome: Progressing   Problem: Education: Goal: Mental status will improve Outcome: Not Progressing

## 2022-05-03 NOTE — Progress Notes (Signed)
Pt continues to need verbal prompts to comply with unit routines such as scheduled groups and medications at the beginning of the shift. Denies SI, HI, AVH and pain. However, she noted to be disorganized, still delusional and believes "Dr. Nelda Marseille and all the other doctors are trying to transgenderlize me. Some of you guys are interpreting things for me and I don't need that". She remains medication compliant without adverse drug reactions. Attended and participated in scheduled groups with prompts. Continued support and reassurance offered to pt and concerns validated. Verbal education provided on all medications and effects monitored. Q 15 minutes checks maintained without outburst or self harm gestures. Pt tolerates meals and fluids well. Safety maintained in milieu.

## 2022-05-03 NOTE — Group Note (Signed)
LCSW Group Therapy Note   Group Date: 05/03/2022 Start Time: 1300 End Time: 1400  Type of Therapy and Topic:  Group Therapy - Healthy vs Unhealthy Coping Skills  Participation Level:  Active   Description of Group The focus of this group was to determine what unhealthy coping techniques typically are used by group members and what healthy coping techniques would be helpful in coping with various problems. Patients were guided in becoming aware of the differences between healthy and unhealthy coping techniques. Patients were asked to identify 2-3 healthy coping skills they would like to learn to use more effectively.  Therapeutic Goals Patients learned that coping is what human beings do all day long to deal with various situations in their lives Patients defined and discussed healthy vs unhealthy coping techniques Patients identified their preferred coping techniques and identified whether these were healthy or unhealthy Patients determined 2-3 healthy coping skills they would like to become more familiar with and use more often. Patients provided support and ideas to each other   Summary of Patient Progress:  During group, the Pt expressed that talking things out was a helpful coping skill for them. Patient proved open to input from peers and feedback from [place]. Patient demonstrated insight into the subject matter, was respectful of peers, and participated throughout the entire session.   Therapeutic Modalities Cognitive Behavioral Therapy Motivational Interviewing  Darleen Crocker, [place] 05/03/2022  1:56 PM

## 2022-05-03 NOTE — Progress Notes (Signed)
     05/03/22 2052  Psych Admission Type (Psych Patients Only)  Admission Status Voluntary  Psychosocial Assessment  Patient Complaints None  Eye Contact Fair  Facial Expression Worried  Affect Appropriate to circumstance  Speech Tangential  Interaction Cautious  Motor Activity Slow  Appearance/Hygiene Unremarkable  Behavior Characteristics Cooperative  Mood Preoccupied  Thought Process  Coherency Tangential  Content Preoccupation  Delusions None reported or observed  Perception WDL  Hallucination None reported or observed  Judgment Limited  Confusion None  Danger to Self  Current suicidal ideation? Denies  Self-Injurious Behavior No self-injurious ideation or behavior indicators observed or expressed   Agreement Not to Harm Self Yes  Description of Agreement verbal  Danger to Others  Danger to Others None reported or observed

## 2022-05-03 NOTE — Group Note (Signed)
Date:  05/03/2022 Time:  10:12 AM  Group Topic/Focus:  Orientation:   The focus of this group is to educate the patient on the purpose and policies of crisis stabilization and provide a format to answer questions about their admission.  The group details unit policies and expectations of patients while admitted.    Participation Level:    Participation Quality:      Affect:      Cognitive:      Insight: None  Engagement in Group:  Modes of Intervention:      Additional Comments:    Jerrye Beavers 05/03/2022, 10:12 AM

## 2022-05-03 NOTE — Progress Notes (Signed)

## 2022-05-04 DIAGNOSIS — F250 Schizoaffective disorder, bipolar type: Secondary | ICD-10-CM | POA: Diagnosis not present

## 2022-05-04 NOTE — Group Note (Signed)
Recreation Therapy Group Note   Group Topic:Leisure Education  Group Date: 05/04/2022 Start Time: 1100 End Time: 1125 Facilitators: Victorino Sparrow, LRT,CTRS Location: [address] Dayroom   Goal Area(s) Addresses:  Patient will successfully identify positive leisure and recreation activities.  Patient will acknowledge benefits of participation in healthy leisure activities post discharge.  Patient will actively work with peers toward a shared goal.   Group Description: Pictionary. In groups of 5-7, patients took turns trying to guess the picture being drawn on the board by their teammate.  If the team guessed the correct answer, they won a point.  If the team guessed wrong, the other team got a chance to steal the point. After several rounds of game play, the team with the most points were declared winners. Post-activity discussion reviewed benefits of positive recreation outlets: reducing stress, improving coping mechanisms, increasing self-esteem, and building larger support systems.   Affect/Mood: Appropriate   Participation Level: Active   Participation Quality: Independent   Behavior: Appropriate   Speech/Thought Process: Focused   Insight: Good   Judgement: Good   Modes of Intervention: Cooperative Play   Patient Response to Interventions:  Engaged   Education Outcome:  Acknowledges education and In group clarification offered    Clinical Observations/Individualized Feedback: Pt was engaged and active during group session.  Pt interacted well with peers.  Pt later asked what the benefit of leisure was to which LRT stated it's to let you get away from the stress of your day, family time or just to get away.  Pt also asked what is the proper amount of time to spend on leisure to which LRT stated that is was up to the person because everyone is different.  Pt went on to say she likes to write as leisure.      Plan: Continue to engage patient in RT group sessions  2-3x/week.   Victorino Sparrow, LRT,CTRS 05/04/2022 12:07 PM

## 2022-05-04 NOTE — Progress Notes (Signed)
[facility] MD Progress Note  05/04/2022 12:44 PM [name]  MRN:  [id number]   Reason for Admission:  [name] is a 51 y.o. female with a history of  with a psychiatric history of schizoaffective disorder-bipolar type who presented voluntarily from [name] and was admitted for disorganized thoughts, SI, and paranoia.The patient is currently on Hospital Day 8.   Chart Review from last 24 hours:  The patient's chart was reviewed and nursing notes were reviewed. The patient's case was discussed in multidisciplinary team meeting. Per [name] she received 1 dose of clozapine 25 mg at bedtime last night, compliant with Depakote ER 1000 mg at bedtime, currently on Zyprexa 10 mg at bedtime.  Orders were entered for clozapine 1 dose only last night.  No as needed medication needed or given for anxiety or agitation.  Per staff patient seems to be linear and organized at times but at other times noted to be disorganized in her thoughts in fact this morning she was reported that she noted to staff that she is tired from doctors here trying to "transgenderise" her which seems was some report of it earlier during hospitalization being paranoid from providers on the ACT team but now she is reporting from providers here, will follow.  Information Obtained Today During Patient Interview: The patient was seen and evaluated on the unit.  She presents linear during my evaluation reports mood is good and denies depressed mood or anxiety.  Continues to deny SI HI or AVH, denies paranoia from staff here or outside the hospital.  When confronted the patient with her report to staff RN yesterday that she feels that the provider is trying to "transgenderise her" she tells me that it was a misunderstanding and this is how she felt from a provider on the ACT team prior to her admission related to questions she was asked when asked her further she elaborates that she was asked questions about her sexual orientation and that what made her  feel that way, but adamantly and repeatedly during my evaluation today patient denies any paranoia from staff here or from staff with ACT team at this time.  In fact she seems to have good report and trust toward her psychiatrist with ACT team Dr. Tamala Julian and I discussed with her I spoke with him yesterday and we will continue with his current medication management plan.  She denies side effect to current medication regimen and agrees to comply.    Chart review indicates when was here before she was stabilized and discharged on Invega Sustenna and clozapine 100 mg daily but clozapine was discontinued on outpatient basis then she received [name] in June then decompensated and got hospitalized last month for worsening mood and psychosis.  Addendum: Later in the day I received a phone call from ACT team psychiatrist Dr. Dora Sims, discussed the patient's care including recent medication regimen and history of being stabilized on Invega monthly injection and clozapine 100 mg daily, Dr. Tamala Julian agrees that [name] was apparently not sufficient to last patient is a duration of 3 months leading to recent decompensation and psychosis.  After careful lengthy discussion of patient's care, decision was made to switch nightly dose of Zyprexa with Invega orally and titrated up gradually to monitor efficacy and safety in combination with clozapine titration.  If this regimen is effective will start her on [place] prior to discharge, will follow. Sleep  Sleep: Good per patient's report  Principal Problem: Schizoaffective disorder, bipolar type ([place]) Diagnosis:  Principal Problem:   Schizoaffective disorder, bipolar type ([place]) Active Problems:   Suicidal ideations    Past Psychiatric History: Previous Psych Diagnoses: Schizoaffective- BP type Prior inpatient treatment: [facility] 02/2022; [facility] 09/27/19, 07/2021; July 2020 [place] Current/prior outpatient treatment: [name]  Prior rehab  hx: Denies Psychotherapy hx: Denies History of suicide: Reports suicide attempt in college by cutting wrists History of homicide: Denies Psychiatric medication history: Prev trials- Depakote ER, Risperdal (helped to calm her), Abilify PO and LAI, PO paliperidone while titrating LAI; haldol (allergies), prolixin, invega , zyprexa, seroquel, Clozaril (unsure of compliance with regimen) Neuromodulation history: ECT in 1993 Current Psychiatrist: [place] Current therapist: Denies  Past Medical History:  Past Medical History:  Diagnosis Date   Anxiety    Asthma    Bipolar 1 disorder ([place])    Depression    Gallstones 08/2020   Hypertension    Insomnia, persistent    Prediabetes    Schizophrenic disorder ([place])    Seizures ([place])     Past Surgical History:  Procedure Laterality Date   BREAST LUMPECTOMY Left 07/2013   TONSILLECTOMY     TUBAL LIGATION  2002   Family History:  Family History  Problem Relation Age of Onset   Depression Mother    Gout Mother    Cancer Father        prostate   Other Father        lung issue   Alcoholism Other    Heart attack Paternal Grandfather    Heart attack Paternal Grandmother    Heart attack Maternal Grandmother    Heart attack Maternal Grandfather    Depression Son    Anxiety disorder Son    Family Psychiatric  History: Psych: Mother and maternal grandmother with schizophrenia, dad schizoaffective disorder depressive type Psych Rx: Unknown SA/HA: Uncle completed suicide Substance use family hx: Denies Social History: See H&P  Current Medications: Current Facility-Administered Medications  Medication Dose Route Frequency Provider Last Rate Last Admin   acetaminophen (TYLENOL) tablet 650 mg  650 mg Oral Q6H PRN [name], NP       albuterol (VENTOLIN HFA) 108 (90 Base) MCG/ACT inhaler 2 puff  2 puff Inhalation QID PRN [name], NP       alum & mag hydroxide-simeth (MAALOX/MYLANTA) 200-200-20 MG/5ML suspension 30 mL  30 mL Oral  Q4H PRN [name], NP       ascorbic acid (VITAMIN C) tablet 500 mg  500 mg Oral BID [name], NP   500 mg at 05/04/22 0804   atorvastatin (LIPITOR) tablet 10 mg  10 mg Oral Daily [name], NP   10 mg at 05/04/22 0804   cloZAPine (CLOZARIL) tablet 37.5 mg  37.5 mg Oral BID Dian Situ, MD       Followed by   Derrill Memo ON 05/05/2022] cloZAPine (CLOZARIL) tablet 50 mg  50 mg Oral BID Winfred Leeds, Isatou Agredano, MD       divalproex (DEPAKOTE ER) 24 hr tablet 1,000 mg  1,000 mg Oral QHS [name], NP   1,000 mg at 05/03/22 2052   docusate sodium (COLACE) capsule 100 mg  100 mg Oral Daily [name], NP   100 mg at 05/04/22 0804   feeding supplement (KATE FARMS STANDARD 1.4) liquid 325 mL  325 mL Oral BID PRN Harlow Asa, MD       hydrOXYzine (ATARAX) tablet 25 mg  25 mg Oral TID PRN Harlow Asa, MD       OLANZapine  zydis (ZYPREXA) disintegrating tablet 5 mg  5 mg Oral Q8H PRN Harlow Asa, MD       And   LORazepam (ATIVAN) tablet 1 mg  1 mg Oral PRN Harlow Asa, MD       And   ziprasidone (GEODON) injection 20 mg  20 mg Intramuscular PRN Nelda Marseille, Amy E, MD       magnesium hydroxide (MILK OF MAGNESIA) suspension 30 mL  30 mL Oral Daily PRN [name], NP       metFORMIN (GLUCOPHAGE) tablet 500 mg  500 mg Oral Q breakfast [name], NP   500 mg at 05/04/22 0804   paliperidone (INVEGA) 24 hr tablet 6 mg  6 mg Oral QHS Winfred Leeds, Azaria Stegman, MD   6 mg at 05/03/22 2052    Lab Results: No results found for this or any previous visit (from the past 48 hour(s)).  Blood Alcohol level:  Lab Results  Component Value Date   ETH <10 04/25/2022   ETH <10 56/21/3086    Metabolic Disorder Labs: Lab Results  Component Value Date   HGBA1C 5.5 04/27/2022   MPG 111.15 04/27/2022   MPG 119.76 08/12/2021   Lab Results  Component Value Date   PROLACTIN 74.6 (H) 09/28/2019   PROLACTIN 4.2 (L) 12/19/2018   Lab Results  Component Value Date   CHOL 150 04/27/2022   TRIG 66  04/27/2022   HDL 41 04/27/2022   CHOLHDL 3.7 04/27/2022   VLDL 13 04/27/2022   LDLCALC 96 04/27/2022   LDLCALC 74 08/16/2021    Physical Findings: AIMS: Facial and Oral Movements Muscles of Facial Expression: None, normal Lips and Perioral Area: None, normal Jaw: None, normal Tongue: None, normal,Extremity Movements Upper (arms, wrists, hands, fingers): None, normal Lower (legs, knees, ankles, toes): None, normal, Trunk Movements Neck, shoulders, hips: None, normal, Overall Severity Severity of abnormal movements (highest score from questions above): None, normal Incapacitation due to abnormal movements: None, normal [name] awareness of abnormal movements (rate only patient's report): No Awareness, Dental Status Current problems with teeth and/or dentures?: No Does patient usually wear dentures?: No  CIWA:    COWS:     Musculoskeletal: Strength & Muscle Tone: within normal limits Gait & Station: normal Patient leans: N/A  Psychiatric Specialty Exam:  General Appearance: Unkempt, below average hygiene, appears at stated age  Behavior: Cooperative in general  Psychomotor Activity:No psychomotor agitation or retardation noted   Eye Contact: Limited Speech: Normal amount, tone and volume   Mood: Euthymic Affect: Restricted  Thought Process: Circumstantial and vague but able to answer questions Descriptions of Associations: Concrete Thought Content: Hallucinations: Denies AH, VH, does not present responding to stimuli Delusions: No paranoia noted during evaluation Suicidal Thoughts: Denied eyes SI, intention, plan  Homicidal Thoughts: Denies HI, intention, plan   Alertness/Orientation: Alert and oriented except confused about the day of the week  Insight: Limited Judgment: Limited  Memory: [place]  Community education officer  Concentration: Limited, easily distractible Attention Span: Decreased Recall: limited Fund of Knowledge: limited   Assets  Assets:Desire  for Improvement; Financial Resources/Insurance; Housing; Social Support    Physical Exam: Physical Exam Vitals and nursing note reviewed.    Review of Systems  All other systems reviewed and are negative.  Blood pressure 104/81, pulse 91, temperature 98 F (36.7 C), temperature source Oral, resp. rate 16, height '5\' 6"'$  (1.676 m), weight 104.1 kg, SpO2 99 %. Body mass index is 37.03 kg/m.   Treatment Plan Summary:  ASSESSMENT:  Diagnoses / Active Problems: Principal Problem: Schizoaffective disorder, bipolar type ([place]) Diagnosis: Principal Problem:   Schizoaffective disorder, bipolar type ([place]) Active Problems:   Suicidal ideations   PLAN: Safety and Monitoring:  -- Voluntary admission to inpatient psychiatric unit for safety, stabilization and treatment  -- Daily contact with patient to assess and evaluate symptoms and progress in treatment  -- [name] case to be discussed in multi-disciplinary team meeting  -- Observation Level : q15 minute checks  -- Vital signs:  q12 hours  -- Precautions: suicide, elopement, and assault  2. Medications:   Continue Clozaril titration, currently on 37.5 mg twice daily  Continue Invega 6 mg p.o. at bedtime for psychosis Consider to start Invega sustena injection if Invega orally is effective in combination with clozapine  Continue Depakote 1000 mg ER at bedtime for mood stabilization  Continue Lipitor 10 mg daily for hyperlipidemia  Continue Glucophage 500 mg daily with breakfast for diabetes  The risks/benefits/side-effects/alternatives to this medication were discussed in detail with the patient and time was given for questions. The patient consents to medication trial.    -- Metabolic profile and EKG monitoring obtained while on an atypical antipsychotic (BMI: Lipid Panel: HbgA1c: QTc:)      3. Pertinent labs: BMP no significant abnormalities noted, lipid profile within normal limits, CBC no significant abnormalities noted,  Depakote level 62 8/31, hemoglobin A1c 5.5, TSH within normal limits, UDS negative at time of admission      Lab ordered: None at this time  4. Group and Therapy: -- Encouraged patient to participate in unit milieu and in scheduled group therapies       -- Short Term Goals: Ability to identify changes in lifestyle to reduce recurrence of condition will improve, Ability to verbalize feelings will improve, Ability to disclose and discuss suicidal ideas, Ability to demonstrate self-control will improve, and Ability to identify and develop effective coping behaviors will improve  -- Long Term Goals: Improvement in symptoms so as ready for discharge  5. Discharge Planning:   -- Social work and case management to assist with discharge planning and identification of hospital follow-up needs prior to discharge  -- Estimated LOS: 5-7 days  -- Discharge Concerns: Need to establish a safety plan; Medication compliance and effectiveness  -- Discharge Goals: Return home with outpatient referrals for mental health follow-up including medication management/psychotherapy      Total Time Spent in Direct Patient Care:  I personally spent 35 minutes on the unit in direct patient care. The direct patient care time included face-to-face time with the patient, reviewing the patient's chart, communicating with other professionals, and coordinating care. Greater than 50% of this time was spent in counseling or coordinating care with the patient regarding goals of hospitalization, psycho-education, and discharge planning needs.   [name] Winfred Leeds, MD 05/04/2022, 12:44 PM

## 2022-05-04 NOTE — B[facility] Group Notes (Signed)
Patient had a 1:1 group discussion with Tech, talked about how her day has been, and goals achieved today. Requested for ice cream and some chips for snacks which she had in the dayroom while listening to her favorite songs on YouTube.. Patient is presently retired back to her room.

## 2022-05-04 NOTE — [facility] Group Notes (Signed)
Adult Psychoeducational Group Note  Date:  05/04/2022 Time:  10:37 AM  Group Topic/Focus:  Goals Group:   The focus of this group is to help patients establish daily goals to achieve during treatment and discuss how the patient can incorporate goal setting into their daily lives to aide in recovery.  Participation Level:  Active  Participation Quality:  Attentive  Affect:  Appropriate  Cognitive:  Appropriate  Insight: Appropriate  Engagement in Group:  Engaged  Modes of Intervention:  Discussion  Additional Comments:  Patient attended goals group and was attentive the duration of it. [name] goal was to attend all groups.   Cartez Mogle T [place] 05/04/2022, 10:37 AM

## 2022-05-04 NOTE — Progress Notes (Signed)
Pt medication compliant today. Pt in bed most of morning, did attend scheduled groups. Pt presents hyper verbal with circumstantial speech today, discussed lending her car to a friend prior to admission and states this friend promised to pay her 2 thousand dollars to borrow the car but has only paid her $40. Pt states both of her parents are aware of situation. Pt denies SI/HI/self harm thoughts as well as a/v hallucinations.Q 15 minute checks ongoing for safety.

## 2022-05-04 NOTE — Plan of Care (Signed)
  Problem: Activity: Goal: Interest or engagement in activities will improve Outcome: Progressing   Problem: Coping: Goal: Ability to verbalize frustrations and anger appropriately will improve Outcome: Progressing Goal: Ability to demonstrate self-control will improve Outcome: Progressing

## 2022-05-04 NOTE — Progress Notes (Signed)
Adult Psychoeducational Group Note  Date:  05/03/2022 Time:  2000  Group Topic/Focus:  Wrap-Up Group:   The focus of this group is to help patients review their daily goal of treatment and discuss progress on daily workbooks.  Participation Level:  Did Not Attend  Participation Quality:   n/a  Affect:   n/a  Cognitive:   n/a  Insight: None  Engagement in Group:   n/a  Modes of Intervention:   n/a  Additional Comments:    Pt did not attend the Wrap Up group.  Wetzel Bjornstad Karolyne Timmons 05/04/2022, 3:14 AM

## 2022-05-04 NOTE — Progress Notes (Addendum)
   On assessment, pt is just waking up.  Pt reports she is tired.  Pt denies SI/HI/AVH.  Pt is engaged in conversation about her day.  Pt reports speaking with mother and father today.  Pt reports her daughter was working.  Pt's speech then becomes tangential reporting  that her mother was mad, she needs to buy more vitamins, and then discusses a list of medications that she has.  Pt is redirectable and is offered snack.  Pt observed in dayroom enjoying snack and communicating with staff.  Medication was administered.    Pt remains safe on the unit with Q 15 minute safety checks.    05/04/22 2121  Psych Admission Type (Psych Patients Only)  Admission Status Voluntary  Psychosocial Assessment  Patient Complaints None  Eye Contact Fair  Facial Expression Animated  Affect Appropriate to circumstance  Speech Tangential  Interaction Assertive  Motor Activity Slow  Appearance/Hygiene Unremarkable  Behavior Characteristics Cooperative  Mood Depressed  Thought Process  Coherency Disorganized;Tangential  Content WDL  Delusions None reported or observed  Perception WDL  Hallucination None reported or observed  Judgment Impaired  Confusion None  Danger to Self  Current suicidal ideation? Denies  Self-Injurious Behavior No self-injurious ideation or behavior indicators observed or expressed   Agreement Not to Harm Self Yes  Description of Agreement verbal  Danger to Others  Danger to Others None reported or observed

## 2022-05-05 DIAGNOSIS — F250 Schizoaffective disorder, bipolar type: Secondary | ICD-10-CM | POA: Diagnosis not present

## 2022-05-05 MED ORDER — TRAZODONE HCL 50 MG PO TABS: 50.0000 mg | ORAL_TABLET | Freq: Every evening | ORAL | Status: DC | PRN

## 2022-05-05 MED ORDER — PALIPERIDONE PALMITATE ER 234 MG/1.5ML IM SUSY
234.0000 mg | PREFILLED_SYRINGE | Freq: Once | INTRAMUSCULAR | Status: DC
Start: 2022-05-07 — End: 2022-05-05
  Filled 2022-05-05: qty 1.5

## 2022-05-05 MED ORDER — PALIPERIDONE PALMITATE ER 234 MG/1.5ML IM SUSY
234.0000 mg | PREFILLED_SYRINGE | Freq: Once | INTRAMUSCULAR | Status: AC
Start: 2022-05-06 — End: 2022-05-06
  Administered 2022-05-06: 234 mg via INTRAMUSCULAR
  Filled 2022-05-05: qty 1.5

## 2022-05-05 NOTE — Progress Notes (Addendum)
D: Pt presented with mild confusion this morning when RN woke up pt for their 8am medications. The pt thought she had already received her medications. Pt presented to the med window with an anxious mood. Pt has been calm and cooperative throughout the day. Pt denies SI/ HI/ AVH. Pt reports 0/10 depression and 0/10 anxiety. Pt reports that they are experiencing no pain or physical problems. Pt has slept in their room for a majority of the day.  A: RN provided support and encouragement to patient. Pt given scheduled medications as prescribed. Q15 min checks verified for safety. RN will continue to monitor pt's progress and provide assistance as indicated.   R: Pt is safe on the unit. Will continue to monitor   05/05/22 0820  Psych Admission Type (Psych Patients Only)  Admission Status Voluntary  Psychosocial Assessment  Patient Complaints None  Eye Contact Fair  Facial Expression Animated  Affect Appropriate to circumstance  Speech Soft  Interaction Assertive  Motor Activity Slow  Appearance/Hygiene Unremarkable  Behavior Characteristics Cooperative;Appropriate to situation  Mood Anxious  Thought Process  Coherency Tangential  Content WDL  Delusions None reported or observed  Perception WDL  Hallucination None reported or observed  Judgment Impaired  Confusion Mild  Danger to Self  Current suicidal ideation? Denies  Self-Injurious Behavior No self-injurious ideation or behavior indicators observed or expressed   Agreement Not to Harm Self Yes  Description of Agreement verbal  Danger to Others  Danger to Others None reported or observed

## 2022-05-05 NOTE — Progress Notes (Signed)
The focus of this group is to help patients review their daily goal of treatment and discuss progress on daily workbooks.  Pt attended the evening group and responded to all discussion prompts from the [place]. Pt shared that today was a good day on the unit, the highlight of which was "speaking up in the social worker group. It was a really good talk."  Pt shared that her goal for the coming week was to prepare for her upcoming discharge, which included figuring out her prescriptions prior to leaving the hospital. "It can be hard to figure that stuff out when you don't have anywhere to go. I want everything in place before I leave."  Pt rated her day a 7 out of 10 and her affect was appropriate.

## 2022-05-05 NOTE — Progress Notes (Addendum)
[facility] MD Progress Note  05/05/2022 10:41 AM [name]  MRN:  [id number]   Reason for Admission:  [name] is a 51 y.o. female with a history of  with a psychiatric history of schizoaffective disorder-bipolar type who presented voluntarily from [facility] and was admitted for disorganized thoughts, SI, and paranoia.The patient is currently on Hospital Day 9.   Chart Review from last 24 hours:  The patient's chart was reviewed and nursing notes were reviewed. The patient's case was discussed in multidisciplinary team meeting. Per MAR compliant with scheduled psychotropic medications including clozapine titration as well as Depakote daily and Invega at bedtime.  Orders were entered for clozapine 1 dose only last night.  No as needed medication needed or given for anxiety or agitation.  Per staff patient is mostly her linear continues to be occasionally disorganized per staff reports but no specific paranoia or delusions reported or noted.  Information Obtained Today During Patient Interview: The patient was seen and evaluated in her room, she is lying down in bed, only.  She is able to answer questions in a linear manner concrete most of the times but does not appear disorganized at this time no paranoia at time of my evaluation noted does not appear responding to stimuli.  When asked she denies feeling depressed or anxious and denies SI HI or AVH, she denies paranoia towards staff or peers here in the hospital or staff from ACT team outside the hospital "no conspiracy no conspiracy" she tells me that the medications seem to be helping her when asked to hear appropriate she responds "thinking better no conspiracy" she denies side effect to medications and agrees to comply after discharge.  Discussed with patient the plan to give her first injection of [place] today to get second injection on Tuesday prior to dc.   She does not display any sign consistent with EPS or TD at time of my  evaluation.  Sleep  Sleep: Good per patient's report  Principal Problem: Schizoaffective disorder, bipolar type ([place]) Diagnosis: Principal Problem:   Schizoaffective disorder, bipolar type ([place]) Active Problems:   Suicidal ideations    Past Psychiatric History: Previous Psych Diagnoses: Schizoaffective- BP type Prior inpatient treatment: [facility] 02/2022; [facility] 09/27/19, 07/2021; July 2020 [place] Current/prior outpatient treatment: [name]  Prior rehab hx: Denies Psychotherapy hx: Denies History of suicide: Reports suicide attempt in college by cutting wrists History of homicide: Denies Psychiatric medication history: Prev trials- Depakote ER, Risperdal (helped to calm her), Abilify PO and LAI, PO paliperidone while titrating LAI; haldol (allergies), prolixin, invega , zyprexa, seroquel, Clozaril (unsure of compliance with regimen) Neuromodulation history: ECT in 1993 Current Psychiatrist: [place] Current therapist: Denies  Past Medical History:  Past Medical History:  Diagnosis Date   Anxiety    Asthma    Bipolar 1 disorder ([place])    Depression    Gallstones 08/2020   Hypertension    Insomnia, persistent    Prediabetes    Schizophrenic disorder ([place])    Seizures ([place])     Past Surgical History:  Procedure Laterality Date   BREAST LUMPECTOMY Left 07/2013   TONSILLECTOMY     TUBAL LIGATION  2002   Family History:  Family History  Problem Relation Age of Onset   Depression Mother    Gout Mother    Cancer Father        prostate   Other Father        lung issue   Alcoholism Other  Heart attack Paternal Grandfather    Heart attack Paternal Grandmother    Heart attack Maternal Grandmother    Heart attack Maternal Grandfather    Depression Son    Anxiety disorder Son    Family Psychiatric  History: Psych: Mother and maternal grandmother with schizophrenia, dad schizoaffective disorder depressive type Psych Rx: Unknown SA/HA: Uncle completed  suicide Substance use family hx: Denies Social History: See H&P  Current Medications: Current Facility-Administered Medications  Medication Dose Route Frequency Provider Last Rate Last Admin   acetaminophen (TYLENOL) tablet 650 mg  650 mg Oral Q6H PRN [name], NP       albuterol (VENTOLIN HFA) 108 (90 Base) MCG/ACT inhaler 2 puff  2 puff Inhalation QID PRN [name], NP       alum & mag hydroxide-simeth (MAALOX/MYLANTA) 200-200-20 MG/5ML suspension 30 mL  30 mL Oral Q4H PRN [name], NP       ascorbic acid (VITAMIN C) tablet 500 mg  500 mg Oral BID [name], NP   500 mg at 05/05/22 0804   atorvastatin (LIPITOR) tablet 10 mg  10 mg Oral Daily [name], NP   10 mg at 05/05/22 0804   cloZAPine (CLOZARIL) tablet 50 mg  50 mg Oral BID Dian Situ, MD       divalproex (DEPAKOTE ER) 24 hr tablet 1,000 mg  1,000 mg Oral QHS [name], NP   1,000 mg at 05/04/22 2121   docusate sodium (COLACE) capsule 100 mg  100 mg Oral Daily [name], NP   100 mg at 05/05/22 3875   feeding supplement (KATE FARMS STANDARD 1.4) liquid 325 mL  325 mL Oral BID PRN Harlow Asa, MD       hydrOXYzine (ATARAX) tablet 25 mg  25 mg Oral TID PRN Harlow Asa, MD       OLANZapine zydis (ZYPREXA) disintegrating tablet 5 mg  5 mg Oral Q8H PRN Harlow Asa, MD       And   LORazepam (ATIVAN) tablet 1 mg  1 mg Oral PRN Harlow Asa, MD       And   ziprasidone (GEODON) injection 20 mg  20 mg Intramuscular PRN Nelda Marseille, Amy E, MD       magnesium hydroxide (MILK OF MAGNESIA) suspension 30 mL  30 mL Oral Daily PRN [name], NP       metFORMIN (GLUCOPHAGE) tablet 500 mg  500 mg Oral Q breakfast [name], NP   500 mg at 05/05/22 0804   paliperidone (INVEGA) 24 hr tablet 6 mg  6 mg Oral QHS Winfred Leeds, Son Barkan, MD   6 mg at 05/04/22 2122    Lab Results: No results found for this or any previous visit (from the past 48 hour(s)).  Blood Alcohol level:  Lab Results  Component  Value Date   ETH <10 04/25/2022   ETH <10 64/33/2951    Metabolic Disorder Labs: Lab Results  Component Value Date   HGBA1C 5.5 04/27/2022   MPG 111.15 04/27/2022   MPG 119.76 08/12/2021   Lab Results  Component Value Date   PROLACTIN 74.6 (H) 09/28/2019   PROLACTIN 4.2 (L) 12/19/2018   Lab Results  Component Value Date   CHOL 150 04/27/2022   TRIG 66 04/27/2022   HDL 41 04/27/2022   CHOLHDL 3.7 04/27/2022   VLDL 13 04/27/2022   LDLCALC 96 04/27/2022   LDLCALC 74 08/16/2021    Physical Findings: AIMS: Facial and Oral Movements Muscles of Facial  Expression: None, normal Lips and Perioral Area: None, normal Jaw: None, normal Tongue: None, normal,Extremity Movements Upper (arms, wrists, hands, fingers): None, normal Lower (legs, knees, ankles, toes): None, normal, Trunk Movements Neck, shoulders, hips: None, normal, Overall Severity Severity of abnormal movements (highest score from questions above): None, normal Incapacitation due to abnormal movements: None, normal [name] awareness of abnormal movements (rate only patient's report): No Awareness, Dental Status Current problems with teeth and/or dentures?: No Does patient usually wear dentures?: No  CIWA:    COWS:     Musculoskeletal: Strength & Muscle Tone: within normal limits Gait & Station: normal Patient leans: N/A  Psychiatric Specialty Exam:  General Appearance: Unkempt, below average hygiene, appears at stated age  Behavior: Cooperative in general  Psychomotor Activity:No psychomotor agitation or retardation noted   Eye Contact: Fair yet limited Speech: Decreased amount, normal tone and volume   Mood: Euthymic Affect: Restricted  Thought Process: Linear, concrete Descriptions of Associations: Concrete Thought Content: Hallucinations: Denies AH, VH, does not present responding to stimuli Delusions: No paranoia noted during evaluation Suicidal Thoughts: Denies SI, intention, plan   Homicidal Thoughts: Denies HI, intention, plan   Alertness/Orientation: Alert and partially oriented  Insight: Limited Judgment: Limited  Memory: [place]  Community education officer  Concentration: Limited, easily distractible Attention Span: Decreased Recall: limited Fund of Knowledge: limited   Assets  Assets:Desire for Improvement; Financial Resources/Insurance; Housing; Social Support    Physical Exam: Physical Exam Vitals and nursing note reviewed.    Review of Systems  All other systems reviewed and are negative.  Blood pressure 126/79, pulse (!) 102, temperature 98.1 F (36.7 C), temperature source Oral, resp. rate 16, height '5\' 6"'$  (1.676 m), weight 104.1 kg, SpO2 100 %. Body mass index is 37.03 kg/m.   Treatment Plan Summary:  ASSESSMENT:  Diagnoses / Active Problems: Principal Problem: Schizoaffective disorder, bipolar type ([place]) Diagnosis: Principal Problem:   Schizoaffective disorder, bipolar type ([place]) Active Problems:   Suicidal ideations   PLAN: Safety and Monitoring:  -- Voluntary admission to inpatient psychiatric unit for safety, stabilization and treatment  -- Daily contact with patient to assess and evaluate symptoms and progress in treatment  -- [name] case to be discussed in multi-disciplinary team meeting  -- Observation Level : q15 minute checks  -- Vital signs:  q12 hours  -- Precautions: suicide, elopement, and assault  2. Medications:   Continue Clozaril titration, today starting 50 mg twice daily, will continue at this dose without further titration given history of stability on this dose Continue Invega 6 mg p.o. at bedtime for psychosis start Invega sustena injection 234 mg IM today, to be followed by a dose of 156 mg IM on Tuesday 9/12 prior to dc, to be followed by monthly injection.    Continue Depakote 1000 mg ER at bedtime for mood stabilization  Continue Lipitor 10 mg daily for hyperlipidemia  Continue Glucophage 500 mg daily  with breakfast for diabetes  The risks/benefits/side-effects/alternatives to this medication were discussed in detail with the patient and time was given for questions. The patient consents to medication trial.    -- Metabolic profile and EKG monitoring obtained while on an atypical antipsychotic (BMI: Lipid Panel: HbgA1c: QTc:)      3. Pertinent labs: BMP no significant abnormalities noted, lipid profile within normal limits, CBC no significant abnormalities noted, Depakote level 62 8/31, hemoglobin A1c 5.5, TSH within normal limits, UDS negative at time of admission      Lab ordered: None at this time  4. Group and Therapy: -- Encouraged patient to participate in unit milieu and in scheduled group therapies       -- Short Term Goals: Ability to identify changes in lifestyle to reduce recurrence of condition will improve, Ability to verbalize feelings will improve, Ability to disclose and discuss suicidal ideas, Ability to demonstrate self-control will improve, and Ability to identify and develop effective coping behaviors will improve  -- Long Term Goals: Improvement in symptoms so as ready for discharge  5. Discharge Planning:   -- Social work and case management to assist with discharge planning and identification of hospital follow-up needs prior to discharge  -- Estimated LOS: 5-7 days  -- Discharge Concerns: Need to establish a safety plan; Medication compliance and effectiveness  -- Discharge Goals: Return home with outpatient referrals for mental health follow-up including medication management/psychotherapy      Total Time Spent in Direct Patient Care:  I personally spent 35 minutes on the unit in direct patient care. The direct patient care time included face-to-face time with the patient, reviewing the patient's chart, communicating with other professionals, and coordinating care. Greater than 50% of this time was spent in counseling or coordinating care with the patient  regarding goals of hospitalization, psycho-education, and discharge planning needs.   Emarie Paul Winfred Leeds, MD 05/05/2022, 10:41 AM

## 2022-05-05 NOTE — Progress Notes (Signed)
     05/05/22 2051  Psych Admission Type (Psych Patients Only)  Admission Status Voluntary  Psychosocial Assessment  Patient Complaints Anxiety;Insomnia  Eye Contact Fair  Facial Expression Animated  Affect Anxious  Speech Tangential  Interaction Assertive  Motor Activity Slow  Appearance/Hygiene Unremarkable  Behavior Characteristics Cooperative;Appropriate to situation  Mood Pleasant;Anxious  Thought Process  Coherency Tangential  Content WDL  Delusions None reported or observed  Perception WDL  Hallucination None reported or observed  Judgment Impaired  Confusion Mild  Danger to Self  Current suicidal ideation? Denies  Self-Injurious Behavior No self-injurious ideation or behavior indicators observed or expressed   Agreement Not to Harm Self Yes  Description of Agreement verbal  Danger to Others  Danger to Others None reported or observed

## 2022-05-05 NOTE — Plan of Care (Signed)
°  Problem: Education: °Goal: Emotional status will improve °Outcome: Progressing °Goal: Mental status will improve °Outcome: Progressing °Goal: Verbalization of understanding the information provided will improve °Outcome: Progressing °  °

## 2022-05-05 NOTE — Group Note (Signed)
LCSW Group Therapy Note   Group Date: 05/05/2022 Start Time: 1300 End Time: 1400  Type of Therapy and Topic: Group Therapy: Control  Participation Level: Active  Description of Group: In this group patients will discuss what is out of their control, what is somewhat in their control, and what is within their control.  They will be encouraged to explore what issues they can control and what issues are out of their control within their daily lives. They will be guided to discuss their thoughts, feelings, and behaviors related to these issues. The group will process together ways to better control things that are well within our own control and how to notice and accept the things that are not within our control. This group will be process-oriented, with patients participating in exploration of their own experiences as well as giving and receiving support and challenge from other group members.  During this group 2 worksheets will be provided to each patient to follow along and fill out.   Therapeutic Goals: 1. Patient will identify what is within their control and what is not within their control. 2. Patient will identify their thoughts and feelings about having control over their own lives. 3. Patient will identify their thoughts and feelings about not having control over everything in their lives. 4. Patient will identify ways that they can have more control over their own lives. 5. Patient will identify areas were they can allow others to help them or provide assistance.  Summary of Patient Progress:  The Pt attended group and remained there the entire time.  The Pt accepted all worksheets and participated in discussions with their peers.  The Pt was appropriate and able to identify areas where they can have more control over their life and choices.   Darleen Crocker, LCSWA 05/05/2022  2:01 PM

## 2022-05-06 DIAGNOSIS — F250 Schizoaffective disorder, bipolar type: Secondary | ICD-10-CM | POA: Diagnosis not present

## 2022-05-06 NOTE — Progress Notes (Signed)
Pt did not attend the goals group.

## 2022-05-06 NOTE — Progress Notes (Signed)
The patient's positive event for the day is that she slept. She rates her day as a 5 out of 10 and mentioned something about housing.

## 2022-05-06 NOTE — Group Note (Signed)
  [facility] LCSW Group Therapy Note  Date/Time:  05/06/2022   Type of Therapy and Topic:  Group Therapy:  Feelings About Hospitalization  Participation Level:  Did Not Attend   Description of Group This process group involved patients discussing their feelings related to being hospitalized, as well as the benefits they see to being in the hospital.  These feelings and benefits were itemized.  The group then brainstormed specific ways in which they could seek those same benefits when they discharge and return home.  Therapeutic Goals Patient will identify and describe positive and negative feelings related to hospitalization Patient will verbalize benefits of hospitalization to themselves personally Patients will brainstorm together ways they can obtain similar benefits in the outpatient setting, identify barriers to wellness and possible solutions  Summary of Patient Progress:  The patient did not attend this group.  Therapeutic Modalities Cognitive Behavioral Therapy Motivational [place], [place] 05/06/2022, 11:05 AM

## 2022-05-06 NOTE — Progress Notes (Signed)
   Pt awakened with anxiety.  Administered PRN hydroxyzine per MAR per pt request.

## 2022-05-06 NOTE — Progress Notes (Signed)
[facility] MD Progress Note  05/06/2022 11:15 AM [name]  MRN:  [id number]   Reason for Admission:  [name] is a 51 y.o. female with a history of  with a psychiatric history of schizoaffective disorder-bipolar type who presented voluntarily from [facility] and was admitted for disorganized thoughts, SI, and paranoia.The patient is currently on Hospital Day 10.   Chart Review from last 24 hours:  The patient's chart was reviewed and nursing notes were reviewed. The patient's case was discussed in multidisciplinary team meeting. Per MAR compliant with scheduled psychotropic medications including clozapine titration as well as Depakote daily and Invega at bedtime.  She will receive first dose of Invega monthly injection today, will follow.  Per staff patient does not present psychotic or paranoid, linear mostly with occasional tangentiality noted.  Information Obtained Today During Patient Interview: The patient was seen and evaluated in her room, she is lying down in bed, she continues to be able to answer questions in a linear manner concrete most of the times but does not appear disorganized at this time no paranoia at time of my evaluation noted does not appear responding to stimuli.  Seems to have much improved insight in regard to her mental illness for example today she asks about her diagnosis of schizophrenia and after explaining it she responds "break of reality" she agrees that following up with act team has been helpful "act in instead of too many hospitalizations"  when asked she denies feeling depressed or anxious and denies SI HI or AVH, she continues to deny paranoia from staff here or outside with ACT team, agrees to comply with medications and follow-up appointments after discharge.    She does not display any sign consistent with EPS or TD at time of my evaluation.  Sleep  Sleep: Good per patient's report  Principal Problem: Schizoaffective disorder, bipolar type ([place]) Diagnosis:  Principal Problem:   Schizoaffective disorder, bipolar type ([place]) Active Problems:   Suicidal ideations    Past Psychiatric History: Previous Psych Diagnoses: Schizoaffective- BP type Prior inpatient treatment: [facility] 02/2022; [facility] 09/27/19, 07/2021; July 2020 [place] Current/prior outpatient treatment: [name]  Prior rehab hx: Denies Psychotherapy hx: Denies History of suicide: Reports suicide attempt in college by cutting wrists History of homicide: Denies Psychiatric medication history: Prev trials- Depakote ER, Risperdal (helped to calm her), Abilify PO and LAI, PO paliperidone while titrating LAI; haldol (allergies), prolixin, invega , zyprexa, seroquel, Clozaril (unsure of compliance with regimen) Neuromodulation history: ECT in 1993 Current Psychiatrist: [place] Current therapist: Denies  Past Medical History:  Past Medical History:  Diagnosis Date   Anxiety    Asthma    Bipolar 1 disorder ([place])    Depression    Gallstones 08/2020   Hypertension    Insomnia, persistent    Prediabetes    Schizophrenic disorder ([place])    Seizures ([place])     Past Surgical History:  Procedure Laterality Date   BREAST LUMPECTOMY Left 07/2013   TONSILLECTOMY     TUBAL LIGATION  2002   Family History:  Family History  Problem Relation Age of Onset   Depression Mother    Gout Mother    Cancer Father        prostate   Other Father        lung issue   Alcoholism Other    Heart attack Paternal Grandfather    Heart attack Paternal Grandmother    Heart attack Maternal Grandmother    Heart attack Maternal  Grandfather    Depression Son    Anxiety disorder Son    Family Psychiatric  History: Psych: Mother and maternal grandmother with schizophrenia, dad schizoaffective disorder depressive type Psych Rx: Unknown SA/HA: Uncle completed suicide Substance use family hx: Denies Social History: See H&P  Current Medications: Current Facility-Administered Medications   Medication Dose Route Frequency Provider Last Rate Last Admin   acetaminophen (TYLENOL) tablet 650 mg  650 mg Oral Q6H PRN [name], NP       albuterol (VENTOLIN HFA) 108 (90 Base) MCG/ACT inhaler 2 puff  2 puff Inhalation QID PRN [name], NP       alum & mag hydroxide-simeth (MAALOX/MYLANTA) 200-200-20 MG/5ML suspension 30 mL  30 mL Oral Q4H PRN [name], NP       ascorbic acid (VITAMIN C) tablet 500 mg  500 mg Oral BID [name], NP   500 mg at 05/06/22 0757   atorvastatin (LIPITOR) tablet 10 mg  10 mg Oral Daily [name], NP   10 mg at 05/06/22 0757   cloZAPine (CLOZARIL) tablet 50 mg  50 mg Oral BID Winfred Leeds, Alrick Cubbage, MD   50 mg at 05/06/22 0756   divalproex (DEPAKOTE ER) 24 hr tablet 1,000 mg  1,000 mg Oral QHS [name], NP   1,000 mg at 05/05/22 2051   docusate sodium (COLACE) capsule 100 mg  100 mg Oral Daily [name], NP   100 mg at 05/06/22 0757   feeding supplement (KATE FARMS STANDARD 1.4) liquid 325 mL  325 mL Oral BID PRN Harlow Asa, MD       hydrOXYzine (ATARAX) tablet 25 mg  25 mg Oral TID PRN Harlow Asa, MD   25 mg at 05/05/22 2326   OLANZapine zydis (ZYPREXA) disintegrating tablet 5 mg  5 mg Oral Q8H PRN Harlow Asa, MD       And   LORazepam (ATIVAN) tablet 1 mg  1 mg Oral PRN Harlow Asa, MD       And   ziprasidone (GEODON) injection 20 mg  20 mg Intramuscular PRN Nelda Marseille, Amy E, MD       magnesium hydroxide (MILK OF MAGNESIA) suspension 30 mL  30 mL Oral Daily PRN [name], NP       metFORMIN (GLUCOPHAGE) tablet 500 mg  500 mg Oral Q breakfast [name], NP   500 mg at 05/06/22 0756   paliperidone (INVEGA SUSTENNA) injection 234 mg  234 mg Intramuscular Once Afton Lavalle, MD       paliperidone (INVEGA) 24 hr tablet 6 mg  6 mg Oral QHS Dorethy Tomey, MD   6 mg at 05/05/22 2051   traZODone (DESYREL) tablet 50 mg  50 mg Oral QHS PRN Bobbitt, Shalon E, NP        Lab Results: No results found for this or any  previous visit (from the past 48 hour(s)).  Blood Alcohol level:  Lab Results  Component Value Date   ETH <10 04/25/2022   ETH <10 42/59/5638    Metabolic Disorder Labs: Lab Results  Component Value Date   HGBA1C 5.5 04/27/2022   MPG 111.15 04/27/2022   MPG 119.76 08/12/2021   Lab Results  Component Value Date   PROLACTIN 74.6 (H) 09/28/2019   PROLACTIN 4.2 (L) 12/19/2018   Lab Results  Component Value Date   CHOL 150 04/27/2022   TRIG 66 04/27/2022   HDL 41 04/27/2022   CHOLHDL 3.7 04/27/2022   VLDL 13 04/27/2022  LDLCALC 96 04/27/2022   LDLCALC 74 08/16/2021    Physical Findings: AIMS: Facial and Oral Movements Muscles of Facial Expression: None, normal Lips and Perioral Area: None, normal Jaw: None, normal Tongue: None, normal,Extremity Movements Upper (arms, wrists, hands, fingers): None, normal Lower (legs, knees, ankles, toes): None, normal, Trunk Movements Neck, shoulders, hips: None, normal, Overall Severity Severity of abnormal movements (highest score from questions above): None, normal Incapacitation due to abnormal movements: None, normal [name] awareness of abnormal movements (rate only patient's report): No Awareness, Dental Status Current problems with teeth and/or dentures?: No Does patient usually wear dentures?: No  CIWA:    COWS:     Musculoskeletal: Strength & Muscle Tone: within normal limits Gait & Station: normal Patient leans: N/A  Psychiatric Specialty Exam:  General Appearance: Unkempt, below average hygiene, appears at stated age  Behavior: Cooperative in general  Psychomotor Activity:No psychomotor agitation or retardation noted   Eye Contact: Fair yet limited Speech: Decreased amount, normal tone and volume   Mood: Euthymic Affect: Restricted  Thought Process: Linear, concrete Descriptions of Associations: Concrete Thought Content: Hallucinations: Denies AH, VH, does not present responding to stimuli Delusions:  No paranoia noted during evaluation Suicidal Thoughts: Denies SI, intention, plan  Homicidal Thoughts: Denies HI, intention, plan   Alertness/Orientation: Alert and partially oriented  Insight: Limited Judgment: Limited  Memory: [place]  Community education officer  Concentration: Limited, easily distractible Attention Span: Decreased Recall: limited Fund of Knowledge: limited   Assets  Assets:Desire for Improvement; Financial Resources/Insurance; Housing; Social Support    Physical Exam: Physical Exam Vitals and nursing note reviewed.    Review of Systems  All other systems reviewed and are negative.  Blood pressure 106/80, pulse 98, temperature 98.5 F (36.9 C), temperature source Oral, resp. rate 18, height '5\' 6"'$  (1.676 m), weight 104.1 kg, SpO2 99 %. Body mass index is 37.03 kg/m.   Treatment Plan Summary:  ASSESSMENT:  Diagnoses / Active Problems: Principal Problem: Schizoaffective disorder, bipolar type ([place]) Diagnosis: Principal Problem:   Schizoaffective disorder, bipolar type ([place]) Active Problems:   Suicidal ideations   PLAN: Safety and Monitoring:  -- Voluntary admission to inpatient psychiatric unit for safety, stabilization and treatment  -- Daily contact with patient to assess and evaluate symptoms and progress in treatment  -- [name] case to be discussed in multi-disciplinary team meeting  -- Observation Level : q15 minute checks  -- Vital signs:  q12 hours  -- Precautions: suicide, elopement, and assault  2. Medications:   Continue Clozaril titration, today starting 50 mg twice daily, will continue at this dose without further titration given history of stability on this dose Continue Invega 6 mg p.o. at bedtime for psychosis start Invega sustena injection 234 mg IM today, to be followed by a dose of 156 mg IM prior to dc, to be followed by monthly injection.    Continue Depakote 1000 mg ER at bedtime for mood stabilization  Continue Lipitor 10 mg  daily for hyperlipidemia  Continue Glucophage 500 mg daily with breakfast for diabetes  The risks/benefits/side-effects/alternatives to this medication were discussed in detail with the patient and time was given for questions. The patient consents to medication trial.    -- Metabolic profile and EKG monitoring obtained while on an atypical antipsychotic (BMI: Lipid Panel: HbgA1c: QTc:)      3. Pertinent labs: BMP no significant abnormalities noted, lipid profile within normal limits, CBC no significant abnormalities noted, Depakote level 62 8/31, hemoglobin A1c 5.5, TSH within normal limits,  UDS negative at time of admission      Lab ordered: None at this time  4. Group and Therapy: -- Encouraged patient to participate in unit milieu and in scheduled group therapies       -- Short Term Goals: Ability to identify changes in lifestyle to reduce recurrence of condition will improve, Ability to verbalize feelings will improve, Ability to disclose and discuss suicidal ideas, Ability to demonstrate self-control will improve, and Ability to identify and develop effective coping behaviors will improve  -- Long Term Goals: Improvement in symptoms so as ready for discharge  5. Discharge Planning:   -- Social work and case management to assist with discharge planning and identification of hospital follow-up needs prior to discharge  -- Estimated LOS: 5-7 days  -- Discharge Concerns: Need to establish a safety plan; Medication compliance and effectiveness  -- Discharge Goals: Return home with outpatient referrals for mental health follow-up including medication management/psychotherapy      Total Time Spent in Direct Patient Care:  I personally spent 35 minutes on the unit in direct patient care. The direct patient care time included face-to-face time with the patient, reviewing the patient's chart, communicating with other professionals, and coordinating care. Greater than 50% of this time was  spent in counseling or coordinating care with the patient regarding goals of hospitalization, psycho-education, and discharge planning needs.   Lejend Dalby Winfred Leeds, MD 05/06/2022, 11:15 AM

## 2022-05-07 DIAGNOSIS — F250 Schizoaffective disorder, bipolar type: Secondary | ICD-10-CM | POA: Diagnosis not present

## 2022-05-07 NOTE — Progress Notes (Signed)
D:  [name] self inventory sheet, patient sleeps good, sleep medication helpful.  Fair appetite, normal energy level, good concentration.  Denied anxiety, depression and coping skills.  Denied withdrawals.  Denied SI, contracts for safety.  Denied physical problems.  Denied physical pain.  Goal is go home.  Plans to talk in groups.  No discharge plans.  A:  Medications administered per MD orders.  Emotional support and discouragement given patient. R:  Denied SI and HI, contracts for safety.  Denied A/V hallucinations.  Safety maintained with 15 minute checks.

## 2022-05-07 NOTE — Plan of Care (Signed)
Nurse discussed reasons to get out of bed and attend groups and coping skills to learn while in the hospital.

## 2022-05-07 NOTE — Progress Notes (Signed)
   05/06/22 2300  Psych Admission Type (Psych Patients Only)  Admission Status Voluntary  Psychosocial Assessment  Patient Complaints None  Eye Contact Fair  Facial Expression Animated  Affect Appropriate to circumstance  Speech Logical/coherent  Interaction Assertive  Motor Activity Slow  Appearance/Hygiene Unremarkable  Behavior Characteristics Cooperative;Appropriate to situation  Mood Pleasant  Thought Process  Coherency WDL  Content WDL  Delusions None reported or observed  Perception WDL  Hallucination None reported or observed  Judgment Poor  Confusion None  Danger to Self  Current suicidal ideation? Denies  Self-Injurious Behavior No self-injurious ideation or behavior indicators observed or expressed   Agreement Not to Harm Self Yes  Description of Agreement verbal  Danger to Others  Danger to Others None reported or observed

## 2022-05-07 NOTE — [facility] Group Notes (Signed)
Patient did not attend the goals group.

## 2022-05-07 NOTE — Progress Notes (Signed)
[facility] MD Progress Note  05/07/2022 10:02 AM [name]  MRN:  [id number]   Reason for Admission:  [name] is a 51 y.o. female with a history of  with a psychiatric history of schizoaffective disorder-bipolar type who presented voluntarily from [facility] and was admitted for disorganized thoughts, SI, and paranoia.The patient is currently on Hospital Day 11.   Chart Review from last 24 hours:  The patient's chart was reviewed and nursing notes were reviewed. The patient's case was discussed in multidisciplinary team meeting. Per MAR compliant with scheduled psychotropic medications including clozapine titration as well as Depakote daily and Invega at bedtime.  She did receive first dose of Invega monthly injection 05/06/22.  Per staff patient does not present psychotic or paranoid, linear mostly with occasional tangentiality noted.  Information Obtained Today During Patient Interview: The patient was seen and evaluated in her room, she is lying down in bed, she continues to be able to answer questions in a linear manner concrete most of the times but does not appear disorganized at this time no paranoia at time of my evaluation noted does not appear responding to stimuli.  Occasionally she seems tangential but easily redirectable with no disorganized thoughts noted.  Able to identify improvement since admission and notes medications are helpful, denies side effects to them. when asked she denies feeling depressed or anxious and denies SI HI or AVH, she continues to deny paranoia from staff here or outside with ACT team, agrees to comply with medications and follow-up appointments after discharge.    She does not display any sign consistent with EPS or TD at time of my evaluation.  Sleep  Sleep: Good per patient's report  Principal Problem: Schizoaffective disorder, bipolar type ([place]) Diagnosis: Principal Problem:   Schizoaffective disorder, bipolar type ([place]) Active Problems:   Suicidal  ideations    Past Psychiatric History: Previous Psych Diagnoses: Schizoaffective- BP type Prior inpatient treatment: [facility] 02/2022; [facility] 09/27/19, 07/2021; July 2020 [place] Current/prior outpatient treatment: [name]  Prior rehab hx: Denies Psychotherapy hx: Denies History of suicide: Reports suicide attempt in college by cutting wrists History of homicide: Denies Psychiatric medication history: Prev trials- Depakote ER, Risperdal (helped to calm her), Abilify PO and LAI, PO paliperidone while titrating LAI; haldol (allergies), prolixin, invega , zyprexa, seroquel, Clozaril (unsure of compliance with regimen) Neuromodulation history: ECT in 1993 Current Psychiatrist: [place] Current therapist: Denies  Past Medical History:  Past Medical History:  Diagnosis Date   Anxiety    Asthma    Bipolar 1 disorder ([place])    Depression    Gallstones 08/2020   Hypertension    Insomnia, persistent    Prediabetes    Schizophrenic disorder ([place])    Seizures ([place])     Past Surgical History:  Procedure Laterality Date   BREAST LUMPECTOMY Left 07/2013   TONSILLECTOMY     TUBAL LIGATION  2002   Family History:  Family History  Problem Relation Age of Onset   Depression Mother    Gout Mother    Cancer Father        prostate   Other Father        lung issue   Alcoholism Other    Heart attack Paternal Grandfather    Heart attack Paternal Grandmother    Heart attack Maternal Grandmother    Heart attack Maternal Grandfather    Depression Son    Anxiety disorder Son    Family Psychiatric  History: Psych: Mother and maternal  grandmother with schizophrenia, dad schizoaffective disorder depressive type Psych Rx: Unknown SA/HA: Uncle completed suicide Substance use family hx: Denies Social History: See H&P  Current Medications: Current Facility-Administered Medications  Medication Dose Route Frequency Provider Last Rate Last Admin   acetaminophen (TYLENOL) tablet 650 mg   650 mg Oral Q6H PRN [name], NP       albuterol (VENTOLIN HFA) 108 (90 Base) MCG/ACT inhaler 2 puff  2 puff Inhalation QID PRN [name], NP       alum & mag hydroxide-simeth (MAALOX/MYLANTA) 200-200-20 MG/5ML suspension 30 mL  30 mL Oral Q4H PRN [name], NP       ascorbic acid (VITAMIN C) tablet 500 mg  500 mg Oral BID [name], NP   500 mg at 05/07/22 0820   atorvastatin (LIPITOR) tablet 10 mg  10 mg Oral Daily [name], NP   10 mg at 05/07/22 0932   cloZAPine (CLOZARIL) tablet 50 mg  50 mg Oral BID Winfred Leeds, Jader Desai, MD   50 mg at 05/07/22 0820   divalproex (DEPAKOTE ER) 24 hr tablet 1,000 mg  1,000 mg Oral QHS [name], NP   1,000 mg at 05/06/22 2052   docusate sodium (COLACE) capsule 100 mg  100 mg Oral Daily [name], NP   100 mg at 05/07/22 3557   feeding supplement (KATE FARMS STANDARD 1.4) liquid 325 mL  325 mL Oral BID PRN Harlow Asa, MD       hydrOXYzine (ATARAX) tablet 25 mg  25 mg Oral TID PRN Harlow Asa, MD   25 mg at 05/05/22 2326   OLANZapine zydis (ZYPREXA) disintegrating tablet 5 mg  5 mg Oral Q8H PRN Harlow Asa, MD       And   LORazepam (ATIVAN) tablet 1 mg  1 mg Oral PRN Harlow Asa, MD       And   ziprasidone (GEODON) injection 20 mg  20 mg Intramuscular PRN Nelda Marseille, Amy E, MD       magnesium hydroxide (MILK OF MAGNESIA) suspension 30 mL  30 mL Oral Daily PRN [name], NP       metFORMIN (GLUCOPHAGE) tablet 500 mg  500 mg Oral Q breakfast [name], NP   500 mg at 05/07/22 0820   paliperidone (INVEGA) 24 hr tablet 6 mg  6 mg Oral QHS Itsel Opfer, MD   6 mg at 05/06/22 2052   traZODone (DESYREL) tablet 50 mg  50 mg Oral QHS PRN Bobbitt, Shalon E, NP        Lab Results: No results found for this or any previous visit (from the past 25 hour(s)).  Blood Alcohol level:  Lab Results  Component Value Date   ETH <10 04/25/2022   ETH <10 32/20/2542    Metabolic Disorder Labs: Lab Results  Component Value  Date   HGBA1C 5.5 04/27/2022   MPG 111.15 04/27/2022   MPG 119.76 08/12/2021   Lab Results  Component Value Date   PROLACTIN 74.6 (H) 09/28/2019   PROLACTIN 4.2 (L) 12/19/2018   Lab Results  Component Value Date   CHOL 150 04/27/2022   TRIG 66 04/27/2022   HDL 41 04/27/2022   CHOLHDL 3.7 04/27/2022   VLDL 13 04/27/2022   LDLCALC 96 04/27/2022   LDLCALC 74 08/16/2021    Physical Findings: AIMS: Facial and Oral Movements Muscles of Facial Expression: None, normal Lips and Perioral Area: None, normal Jaw: None, normal Tongue: None, normal,Extremity Movements Upper (arms, wrists, hands,  fingers): None, normal Lower (legs, knees, ankles, toes): None, normal, Trunk Movements Neck, shoulders, hips: None, normal, Overall Severity Severity of abnormal movements (highest score from questions above): None, normal Incapacitation due to abnormal movements: None, normal [name] awareness of abnormal movements (rate only patient's report): No Awareness, Dental Status Current problems with teeth and/or dentures?: No Does patient usually wear dentures?: No  CIWA:    COWS:     Musculoskeletal: Strength & Muscle Tone: within normal limits Gait & Station: normal Patient leans: N/A  Psychiatric Specialty Exam:  General Appearance: Unkempt, below average hygiene, appears at stated age  Behavior: Cooperative in general  Psychomotor Activity:No psychomotor agitation or retardation noted   Eye Contact: Fair yet limited Speech: Decreased amount, normal tone and volume   Mood: Euthymic Affect: Restricted  Thought Process: Linear, concrete, occasionally tangential but easily redirectable Descriptions of Associations: Concrete Thought Content: Hallucinations: Denies AH, VH, does not present responding to stimuli Delusions: No paranoia noted during evaluation Suicidal Thoughts: Denies SI, intention, plan  Homicidal Thoughts: Denies HI, intention, plan   Alertness/Orientation:  Alert and partially oriented  Insight: Limited Judgment: Limited  Memory: [place]  Community education officer  Concentration: Limited, easily distractible Attention Span: Decreased Recall: limited Fund of Knowledge: limited   Assets  Assets:Desire for Improvement; Financial Resources/Insurance; Housing; Social Support    Physical Exam: Physical Exam Vitals and nursing note reviewed.    Review of Systems  Gastrointestinal:  Positive for diarrhea.  All other systems reviewed and are negative. Except reporting had 2-3 BM yesterday, will follow  Blood pressure 106/80, pulse 98, temperature 98.5 F (36.9 C), temperature source Oral, resp. rate 18, height '5\' 6"'$  (1.676 m), weight 104.1 kg, SpO2 99 %. Body mass index is 37.03 kg/m.   Treatment Plan Summary:  ASSESSMENT:  Diagnoses / Active Problems: Principal Problem: Schizoaffective disorder, bipolar type ([place]) Diagnosis: Principal Problem:   Schizoaffective disorder, bipolar type ([place]) Active Problems:   Suicidal ideations   PLAN: Safety and Monitoring:  -- Voluntary admission to inpatient psychiatric unit for safety, stabilization and treatment  -- Daily contact with patient to assess and evaluate symptoms and progress in treatment  -- [name] case to be discussed in multi-disciplinary team meeting  -- Observation Level : q15 minute checks  -- Vital signs:  q12 hours  -- Precautions: suicide, elopement, and assault  2. Medications:   Continue Clozaril titration, today starting 50 mg twice daily, will continue at this dose without further titration given history of stability on this dose Continue Invega 6 mg p.o. at bedtime for psychosis Received first Invega sustena injection 234 mg IM 05/07/22, to be followed by a dose of 156 mg IM prior to dc, to be followed by monthly injection.    Continue Depakote 1000 mg ER at bedtime for mood stabilization  Continue Lipitor 10 mg daily for hyperlipidemia  Continue Glucophage 500  mg daily with breakfast for diabetes  The risks/benefits/side-effects/alternatives to this medication were discussed in detail with the patient and time was given for questions. The patient consents to medication trial.    -- Metabolic profile and EKG monitoring obtained while on an atypical antipsychotic (BMI: Lipid Panel: HbgA1c: QTc:)   Patient is vague regarding her report of frequent bowel movements yesterday 2-3 but seems to be inconsistent with her report, will follow to address if further work-up is needed.  She denies any other GI symptoms including nausea or vomiting or abdominal pain.    3. Pertinent labs: BMP no significant abnormalities  noted, lipid profile within normal limits, CBC no significant abnormalities noted, Depakote level 62 8/31, hemoglobin A1c 5.5, TSH within normal limits, UDS negative at time of admission      Lab ordered: BMP and CBC on 9/11 to follow-up  4. Group and Therapy: -- Encouraged patient to participate in unit milieu and in scheduled group therapies       -- Short Term Goals: Ability to identify changes in lifestyle to reduce recurrence of condition will improve, Ability to verbalize feelings will improve, Ability to disclose and discuss suicidal ideas, Ability to demonstrate self-control will improve, and Ability to identify and develop effective coping behaviors will improve  -- Long Term Goals: Improvement in symptoms so as ready for discharge  5. Discharge Planning:   -- Social work and case management to assist with discharge planning and identification of hospital follow-up needs prior to discharge  -- Estimated LOS: 5-7 days  -- Discharge Concerns: Need to establish a safety plan; Medication compliance and effectiveness  -- Discharge Goals: Return home with outpatient referrals for mental health follow-up including medication management/psychotherapy      Total Time Spent in Direct Patient Care:  I personally spent 35 minutes on the unit in  direct patient care. The direct patient care time included face-to-face time with the patient, reviewing the patient's chart, communicating with other professionals, and coordinating care. Greater than 50% of this time was spent in counseling or coordinating care with the patient regarding goals of hospitalization, psycho-education, and discharge planning needs.   Vernard Gram Winfred Leeds, MD 05/07/2022, 10:02 AM

## 2022-05-07 NOTE — Group Note (Signed)
LCSW Group Therapy Note  05/07/2022      Type of Therapy and Topic:  Group Therapy: Gratitude  Participation Level:  Did Not Attend   Description of Group:   In this group, patients shared and discussed the importance of acknowledging the elements in their lives for which they are grateful and how this can positively impact their mood.  The group discussed how bringing the positive elements of their lives to the forefront of their minds can help with recovery from any illness, physical or mental.  An exercise was done as a group in which a list was made of gratitude items in order to encourage participants to consider other potential positives in their lives.  Therapeutic Goals: Patients will identify one or more item for which they are grateful in each of 6 categories:  people, experience, thing, place, skill, and other. Patients will discuss how it is possible to seek out gratitude in even bad situations. Patients will explore other possible items of gratitude that they could remember.   Summary of Patient Progress:  The patient did not attend this group. Therapeutic Modalities:   Solution-Focused Therapy Activity  Chrishana Spargur [place], LCSWA 10:36 AM

## 2022-05-08 ENCOUNTER — Encounter (HOSPITAL_COMMUNITY): Payer: Self-pay

## 2022-05-08 DIAGNOSIS — F250 Schizoaffective disorder, bipolar type: Secondary | ICD-10-CM | POA: Diagnosis not present

## 2022-05-08 LAB — CBC WITH DIFFERENTIAL/PLATELET
Abs Immature Granulocytes: 0.03 10*3/uL (ref 0.00–0.07)
Basophils Absolute: 0 10*3/uL (ref 0.0–0.1)
Basophils Relative: 0 %
Eosinophils Absolute: 0.3 10*3/uL (ref 0.0–0.5)
Eosinophils Relative: 5 %
HCT: 35.6 % — ABNORMAL LOW (ref 36.0–46.0)
Hemoglobin: 11.3 g/dL — ABNORMAL LOW (ref 12.0–15.0)
Immature Granulocytes: 1 %
Lymphocytes Relative: 29 %
Lymphs Abs: 1.6 10*3/uL (ref 0.7–4.0)
MCH: 28.7 pg (ref 26.0–34.0)
MCHC: 31.7 g/dL (ref 30.0–36.0)
MCV: 90.4 fL (ref 80.0–100.0)
Monocytes Absolute: 0.4 10*3/uL (ref 0.1–1.0)
Monocytes Relative: 8 %
Neutro Abs: 3.2 10*3/uL (ref 1.7–7.7)
Neutrophils Relative %: 57 %
Platelets: 201 10*3/uL (ref 150–400)
RBC: 3.94 MIL/uL (ref 3.87–5.11)
RDW: 13.5 % (ref 11.5–15.5)
WBC: 5.5 10*3/uL (ref 4.0–10.5)
nRBC: 0 % (ref 0.0–0.2)

## 2022-05-08 LAB — BASIC METABOLIC PANEL
Anion gap: 8 (ref 5–15)
BUN: 12 mg/dL (ref 6–20)
CO2: 27 mmol/L (ref 22–32)
Calcium: 8.8 mg/dL — ABNORMAL LOW (ref 8.9–10.3)
Chloride: 107 mmol/L (ref 98–111)
Creatinine, Ser: 0.87 mg/dL (ref 0.44–1.00)
GFR, Estimated: >60 mL/min (ref >60)
Glucose, Bld: 92 mg/dL (ref 70–99)
Potassium: 3.9 mmol/L (ref 3.5–5.1)
Sodium: 142 mmol/L (ref 135–145)

## 2022-05-08 MED ORDER — PALIPERIDONE PALMITATE ER 156 MG/ML IM SUSY
156.0000 mg | PREFILLED_SYRINGE | Freq: Once | INTRAMUSCULAR | Status: AC
  Administered 2022-05-10: 156 mg via INTRAMUSCULAR

## 2022-05-08 NOTE — Progress Notes (Signed)
   05/07/22 2300  Psych Admission Type (Psych Patients Only)  Admission Status Voluntary  Psychosocial Assessment  Patient Complaints Isolation  Eye Contact Fair  Facial Expression Animated  Affect Appropriate to circumstance  Speech Logical/coherent  Interaction Assertive  Motor Activity Slow  Appearance/Hygiene Unremarkable  Behavior Characteristics Appropriate to situation  Mood Pleasant  Thought Process  Coherency WDL  Content WDL  Delusions None reported or observed  Perception WDL  Hallucination None reported or observed  Judgment Poor  Confusion None  Danger to Self  Current suicidal ideation? Denies  Self-Injurious Behavior No self-injurious ideation or behavior indicators observed or expressed   Agreement Not to Harm Self Yes  Description of Agreement verbal  Danger to Others  Danger to Others None reported or observed

## 2022-05-08 NOTE — [facility] Group Notes (Signed)
Group date: 05/08/2022 Group time: 1430  Group topic: Resources and Support systems  Focus of the group to explore and discuss different resources and identify sources of support system.  Pt attended group and was attentive. Engaged in discussion. Identified her neighbor and her daughter as her support system.

## 2022-05-08 NOTE — Group Note (Signed)
LCSW Group Therapy Note   Group Date: 05/08/2022 Start Time: 1300 End Time: 1400  Type of Therapy/Topic:  Group Therapy:  Balance in Life   Participation Level:  Did Not Attend   Description of Group:    This group will address the concept of balance and how it feels and looks when one is unbalanced. Patients will be encouraged to process areas in their lives that are out of balance and identify reasons for remaining unbalanced. Facilitators will guide patients in utilizing problem-solving interventions to address and correct the stressor making their life unbalanced. Understanding and applying boundaries will be explored and addressed for obtaining and maintaining a balanced life. Patients will be encouraged to explore ways to assertively make their unbalanced needs known to significant others in their lives, using other group members and facilitator for support and feedback.   Therapeutic Goals: Patient will identify two or more emotions or situations they have that consume much of in their lives. Patient will identify two ways to set boundaries in order to achieve balance in their lives:  Identify wants, needs, and ways to incorporate self-care and coping skills into their daily life.   Summary of Patient Progress:  Did not attend     Therapeutic Modalities:   Cognitive Behavioral Therapy Solution-Focused Therapy Assertiveness Training  Darleen Crocker, [place] 05/08/2022  1:49 PM

## 2022-05-08 NOTE — Progress Notes (Signed)
The focus of this group is to help patients review their daily goal of treatment and discuss progress on daily workbooks. Pt did not attend the evening group. 

## 2022-05-08 NOTE — BH IP Treatment Plan (Signed)
Interdisciplinary Treatment and Diagnostic Plan Update  05/08/2022 Time of Session: 0830 [name] MRN: [id number]  Principal Diagnosis: Schizoaffective disorder, bipolar type [facility])  Secondary Diagnoses: Principal Problem:   Schizoaffective disorder, bipolar type ([place]) Active Problems:   Suicidal ideations   Current Medications:  Current Facility-Administered Medications  Medication Dose Route Frequency Provider Last Rate Last Admin   acetaminophen (TYLENOL) tablet 650 mg  650 mg Oral Q6H PRN [name], NP       albuterol (VENTOLIN HFA) 108 (90 Base) MCG/ACT inhaler 2 puff  2 puff Inhalation QID PRN [name], NP       alum & mag hydroxide-simeth (MAALOX/MYLANTA) 200-200-20 MG/5ML suspension 30 mL  30 mL Oral Q4H PRN [name], NP       ascorbic acid (VITAMIN C) tablet 500 mg  500 mg Oral BID [name], NP   500 mg at 05/08/22 0836   atorvastatin (LIPITOR) tablet 10 mg  10 mg Oral Daily [name], NP   10 mg at 05/08/22 0837   cloZAPine (CLOZARIL) tablet 50 mg  50 mg Oral BID Dian Situ, MD   50 mg at 05/08/22 0837   divalproex (DEPAKOTE ER) 24 hr tablet 1,000 mg  1,000 mg Oral QHS [name], NP   1,000 mg at 05/07/22 2247   docusate sodium (COLACE) capsule 100 mg  100 mg Oral Daily [name], NP   100 mg at 05/08/22 0837   feeding supplement (KATE FARMS STANDARD 1.4) liquid 325 mL  325 mL Oral BID PRN Harlow Asa, MD       hydrOXYzine (ATARAX) tablet 25 mg  25 mg Oral TID PRN Harlow Asa, MD   25 mg at 05/05/22 2326   OLANZapine zydis (ZYPREXA) disintegrating tablet 5 mg  5 mg Oral Q8H PRN Harlow Asa, MD       And   LORazepam (ATIVAN) tablet 1 mg  1 mg Oral PRN Harlow Asa, MD       And   ziprasidone (GEODON) injection 20 mg  20 mg Intramuscular PRN Nelda Marseille, Amy E, MD       magnesium hydroxide (MILK OF MAGNESIA) suspension 30 mL  30 mL Oral Daily PRN [name], NP       metFORMIN (GLUCOPHAGE) tablet 500 mg  500 mg Oral Q  breakfast [name], NP   500 mg at 05/08/22 0836   paliperidone (INVEGA) 24 hr tablet 6 mg  6 mg Oral QHS Attiah, Nadir, MD   6 mg at 05/07/22 2247   traZODone (DESYREL) tablet 50 mg  50 mg Oral QHS PRN Bobbitt, Shalon E, NP       PTA Medications: Medications Prior to Admission  Medication Sig Dispense Refill Last Dose   albuterol (VENTOLIN HFA) 108 (90 Base) MCG/ACT inhaler Inhale 2 puffs into the lungs 4 (four) times daily as needed for wheezing or shortness of breath.      atorvastatin (LIPITOR) 10 MG tablet Take 1 tablet (10 mg total) by mouth daily. (Patient taking differently: Take 10 mg by mouth at bedtime.) 30 tablet 0    divalproex (DEPAKOTE ER) 500 MG 24 hr tablet Take 2 tablets (1,000 mg total) by mouth at bedtime. 60 tablet 0    OLANZapine (ZYPREXA) 10 MG tablet Take 10 mg by mouth at bedtime.      paliperidone Palmitate ER (INVEGA TRINZA) 819 MG/2.63ML injection Inject 819 mg into the muscle every 3 (three) months. +/- 14 days of due date  Patient Stressors: Medication change or noncompliance    Patient Strengths: Capable of independent living  General fund of knowledge  Supportive family/friends   Treatment Modalities: Medication Management, Group therapy, Case management,  1 to 1 session with clinician, Psychoeducation, Recreational therapy.   Physician Treatment Plan for Primary Diagnosis: Schizoaffective disorder, bipolar type ([place]) Long Term Goal(s): Improvement in symptoms so as ready for discharge   Short Term Goals: Ability to identify changes in lifestyle to reduce recurrence of condition will improve Ability to verbalize feelings will improve Ability to disclose and discuss suicidal ideas Ability to demonstrate self-control will improve Ability to identify and develop effective coping behaviors will improve  Medication Management: Evaluate patient's response, side effects, and tolerance of medication regimen.  Therapeutic Interventions: 1 to 1  sessions, Unit Group sessions and Medication administration.  Evaluation of Outcomes: Progressing  Physician Treatment Plan for Secondary Diagnosis: Principal Problem:   Schizoaffective disorder, bipolar type ([place]) Active Problems:   Suicidal ideations  Long Term Goal(s): Improvement in symptoms so as ready for discharge   Short Term Goals: Ability to identify changes in lifestyle to reduce recurrence of condition will improve Ability to verbalize feelings will improve Ability to disclose and discuss suicidal ideas Ability to demonstrate self-control will improve Ability to identify and develop effective coping behaviors will improve     Medication Management: Evaluate patient's response, side effects, and tolerance of medication regimen.  Therapeutic Interventions: 1 to 1 sessions, Unit Group sessions and Medication administration.  Evaluation of Outcomes: Progressing   RN Treatment Plan for Primary Diagnosis: Schizoaffective disorder, bipolar type ([place]) Long Term Goal(s): Knowledge of disease and therapeutic regimen to maintain health will improve  Short Term Goals: Ability to remain free from injury will improve, Ability to verbalize frustration and anger appropriately will improve, Ability to demonstrate self-control, Ability to participate in decision making will improve, Ability to verbalize feelings will improve, Ability to disclose and discuss suicidal ideas, Ability to identify and develop effective coping behaviors will improve, and Compliance with prescribed medications will improve  Medication Management: RN will administer medications as ordered by provider, will assess and evaluate patient's response and provide education to patient for prescribed medication. RN will report any adverse and/or side effects to prescribing provider.  Therapeutic Interventions: 1 on 1 counseling sessions, Psychoeducation, Medication administration, Evaluate responses to treatment, Monitor  vital signs and CBGs as ordered, Perform/monitor CIWA, COWS, AIMS and Fall Risk screenings as ordered, Perform wound care treatments as ordered.  Evaluation of Outcomes: Progressing   LCSW Treatment Plan for Primary Diagnosis: Schizoaffective disorder, bipolar type ([place]) Long Term Goal(s): Safe transition to appropriate next level of care at discharge, Engage patient in therapeutic group addressing interpersonal concerns.  Short Term Goals: Engage patient in aftercare planning with referrals and resources, Increase social support, Increase ability to appropriately verbalize feelings, Increase emotional regulation, Facilitate acceptance of mental health diagnosis and concerns, Facilitate patient progression through stages of change regarding substance use diagnoses and concerns, Identify triggers associated with mental health/substance abuse issues, and Increase skills for wellness and recovery  Therapeutic Interventions: Assess for all discharge needs, 1 to 1 time with Social worker, Explore available resources and support systems, Assess for adequacy in community support network, Educate family and significant other(s) on suicide prevention, Complete Psychosocial Assessment, Interpersonal group therapy.  Evaluation of Outcomes: Progressing   Progress in Treatment: Attending groups: No. Participating in groups: No. Taking medication as prescribed: Yes. Toleration medication: Yes. Family/Significant other contact made: Yes, individual(s) contacted:  Aisha Lessington [phone] (Daughter) Patient understands diagnosis: Yes. Discussing patient identified problems/goals with staff: Yes. Medical problems stabilized or resolved: Yes. Denies suicidal/homicidal ideation: No. Issues/concerns per patient self-inventory: Yes. Other: none  New problem(s) identified: No, Describe:  none  New Short Term/Long Term Goal(s): Patient to work towards elimination of symptoms of psychosis, medication  management for mood stabilization; elimination of SI thoughts; development of comprehensive mental wellness plan.  Patient Goals:  No additional goals identified at this time. Patient to continue to work towards original goals identified in initial treatment team meeting. CSW will remain available to patient should they voice additional treatment goals.   Discharge Plan or Barriers:  No psychosocial barriers identified at this time, patient to return to place of residence when appropriate for discharge.   Reason for Continuation of Hospitalization: Depression Suicidal ideation Other; describe paranoia   Estimated Length of Stay: 1-7 days  Last [place] Severity Risk Score: [facility] Admission (Current) from 04/26/2022 in [facility] ED from 04/25/2022 in [facility] DEPT Pre-admit (Canceled) from 12/19/2018 in [facility]  C-SSRS RISK CATEGORY Moderate Risk Moderate Risk No Risk       Last PHQ 2/9 Scores:    02/23/2021    8:30 AM 09/16/2020    1:00 PM 10/24/2016    3:24 PM  Depression screen PHQ 2/9  Decreased Interest 1 0 1  Down, Depressed, Hopeless 1 0 1  PHQ - 2 Score 2 0 2  Altered sleeping '2 1 3  '$ Tired, decreased energy 0 0 1  Change in appetite 0 0 0  Feeling bad or failure about yourself  1 0 1  Trouble concentrating 0 0 0  Moving slowly or fidgety/restless 0 0 3  Suicidal thoughts 1 0 1  PHQ-9 Score '6 1 11    '$ Scribe for Treatment Team: Larose Kells 05/08/2022 11:20 AM

## 2022-05-08 NOTE — Progress Notes (Signed)
[facility] MD Progress Note  05/08/2022 10:39 AM [name]  MRN:  [id number]   Reason for Admission:  [name] is a 51 y.o. female with a history of  with a psychiatric history of schizoaffective disorder-bipolar type who presented voluntarily from [facility] and was admitted for disorganized thoughts, SI, and paranoia.The patient is currently on Hospital Day 12.   Chart Review from last 24 hours:  The patient's chart was reviewed and nursing notes were reviewed. The patient's case was discussed in multidisciplinary team meeting. Per MAR compliant with scheduled psychotropic medications including clozapine titration as well as Depakote daily and Invega at bedtime.  She did receive first dose of Invega monthly injection 05/06/22.  Per staff patient does not present psychotic or paranoid, linear mostly with occasional tangentiality noted.  Information Obtained Today During Patient Interview: The patient was seen and evaluated in her room, she is lying down in bed, she continues to be able to answer questions in a linear manner in general, does not appear disorganized at this time no paranoia at time of my evaluation noted does not appear responding to stimuli.  Occasionally she presents tangential and nonlinear but easily redirectable.  Able to identify improvement since admission and notes medications are helpful, denies side effects to them. when asked she denies feeling depressed or anxious and denies SI HI or AVH, she continues to deny paranoia from staff here or outside with ACT team, agrees to comply with medications and follow-up appointments after discharge.    She does not display any sign consistent with EPS or TD at time of my evaluation.  Sleep  Sleep: Good per patient's report  Principal Problem: Schizoaffective disorder, bipolar type ([place]) Diagnosis: Principal Problem:   Schizoaffective disorder, bipolar type ([place]) Active Problems:   Suicidal ideations    Past Psychiatric History:  Previous Psych Diagnoses: Schizoaffective- BP type Prior inpatient treatment: [facility] 02/2022; [facility] 09/27/19, 07/2021; July 2020 [place] Current/prior outpatient treatment: [name]  Prior rehab hx: Denies Psychotherapy hx: Denies History of suicide: Reports suicide attempt in college by cutting wrists History of homicide: Denies Psychiatric medication history: Prev trials- Depakote ER, Risperdal (helped to calm her), Abilify PO and LAI, PO paliperidone while titrating LAI; haldol (allergies), prolixin, invega , zyprexa, seroquel, Clozaril (unsure of compliance with regimen) Neuromodulation history: ECT in 1993 Current Psychiatrist: [facility] Current therapist: Denies  Past Medical History:  Past Medical History:  Diagnosis Date   Anxiety    Asthma    Bipolar 1 disorder ([place])    Depression    Gallstones 08/2020   Hypertension    Insomnia, persistent    Prediabetes    Schizophrenic disorder ([place])    Seizures ([place])     Past Surgical History:  Procedure Laterality Date   BREAST LUMPECTOMY Left 07/2013   TONSILLECTOMY     TUBAL LIGATION  2002   Family History:  Family History  Problem Relation Age of Onset   Depression Mother    Gout Mother    Cancer Father        prostate   Other Father        lung issue   Alcoholism Other    Heart attack Paternal Grandfather    Heart attack Paternal Grandmother    Heart attack Maternal Grandmother    Heart attack Maternal Grandfather    Depression Son    Anxiety disorder Son    Family Psychiatric  History: Psych: Mother and maternal grandmother with schizophrenia, dad schizoaffective disorder depressive  type Psych Rx: Unknown SA/HA: Uncle completed suicide Substance use family hx: Denies Social History: See H&P  Current Medications: Current Facility-Administered Medications  Medication Dose Route Frequency Provider Last Rate Last Admin   acetaminophen (TYLENOL) tablet 650 mg  650 mg Oral Q6H PRN [name], NP        albuterol (VENTOLIN HFA) 108 (90 Base) MCG/ACT inhaler 2 puff  2 puff Inhalation QID PRN [name], NP       alum & mag hydroxide-simeth (MAALOX/MYLANTA) 200-200-20 MG/5ML suspension 30 mL  30 mL Oral Q4H PRN [name], NP       ascorbic acid (VITAMIN C) tablet 500 mg  500 mg Oral BID [name], NP   500 mg at 05/08/22 0836   atorvastatin (LIPITOR) tablet 10 mg  10 mg Oral Daily [name], NP   10 mg at 05/08/22 0837   cloZAPine (CLOZARIL) tablet 50 mg  50 mg Oral BID Dian Situ, MD   50 mg at 05/08/22 0837   divalproex (DEPAKOTE ER) 24 hr tablet 1,000 mg  1,000 mg Oral QHS [name], NP   1,000 mg at 05/07/22 2247   docusate sodium (COLACE) capsule 100 mg  100 mg Oral Daily [name], NP   100 mg at 05/08/22 4010   feeding supplement (KATE FARMS STANDARD 1.4) liquid 325 mL  325 mL Oral BID PRN Harlow Asa, MD       hydrOXYzine (ATARAX) tablet 25 mg  25 mg Oral TID PRN Harlow Asa, MD   25 mg at 05/05/22 2326   OLANZapine zydis (ZYPREXA) disintegrating tablet 5 mg  5 mg Oral Q8H PRN Harlow Asa, MD       And   LORazepam (ATIVAN) tablet 1 mg  1 mg Oral PRN Harlow Asa, MD       And   ziprasidone (GEODON) injection 20 mg  20 mg Intramuscular PRN Harlow Asa, MD       magnesium hydroxide (MILK OF MAGNESIA) suspension 30 mL  30 mL Oral Daily PRN [name], NP       metFORMIN (GLUCOPHAGE) tablet 500 mg  500 mg Oral Q breakfast [name], NP   500 mg at 05/08/22 0836   paliperidone (INVEGA) 24 hr tablet 6 mg  6 mg Oral QHS Winfred Leeds, Carie Kapuscinski, MD   6 mg at 05/07/22 2247   traZODone (DESYREL) tablet 50 mg  50 mg Oral QHS PRN Bobbitt, Shalon E, NP        Lab Results:  Results for orders placed or performed during the hospital encounter of 04/26/22 (from the past 48 hour(s))  Basic metabolic panel     Status: Abnormal   Collection Time: 05/08/22  6:39 AM  Result Value Ref Range   Sodium 142 135 - 145 mmol/L   Potassium 3.9 3.5 - 5.1 mmol/L    Chloride 107 98 - 111 mmol/L   CO2 27 22 - 32 mmol/L   Glucose, Bld 92 70 - 99 mg/dL    Comment: Glucose reference range applies only to samples taken after fasting for at least 8 hours.   BUN 12 6 - 20 mg/dL   Creatinine, Ser 0.87 0.44 - 1.00 mg/dL   Calcium 8.8 (L) 8.9 - 10.3 mg/dL   GFR, Estimated >60 >60 mL/min    Comment: (NOTE) Calculated using the CKD-EPI Creatinine Equation (2021)    Anion gap 8 5 - 15    Comment: Performed at [facility],  [place] [address], [place], [place] [address]  CBC with Differential/Platelet     Status: Abnormal   Collection Time: 05/08/22  6:39 AM  Result Value Ref Range   WBC 5.5 4.0 - 10.5 K/uL   RBC 3.94 3.87 - 5.11 MIL/uL   Hemoglobin 11.3 (L) 12.0 - 15.0 g/dL   HCT 35.6 (L) 36.0 - 46.0 %   MCV 90.4 80.0 - 100.0 fL   MCH 28.7 26.0 - 34.0 pg   MCHC 31.7 30.0 - 36.0 g/dL   RDW 13.5 11.5 - 15.5 %   Platelets 201 150 - 400 K/uL   nRBC 0.0 0.0 - 0.2 %   Neutrophils Relative % 57 %   Neutro Abs 3.2 1.7 - 7.7 K/uL   Lymphocytes Relative 29 %   Lymphs Abs 1.6 0.7 - 4.0 K/uL   Monocytes Relative 8 %   Monocytes Absolute 0.4 0.1 - 1.0 K/uL   Eosinophils Relative 5 %   Eosinophils Absolute 0.3 0.0 - 0.5 K/uL   Basophils Relative 0 %   Basophils Absolute 0.0 0.0 - 0.1 K/uL   Immature Granulocytes 1 %   Abs Immature Granulocytes 0.03 0.00 - 0.07 K/uL    Comment: Performed at [facility], [place] [address], [place], [place] [address]    Blood Alcohol level:  Lab Results  Component Value Date   [facility] <10 04/25/2022   ETH <10 23/55/7322    Metabolic Disorder Labs: Lab Results  Component Value Date   HGBA1C 5.5 04/27/2022   MPG 111.15 04/27/2022   MPG 119.76 08/12/2021   Lab Results  Component Value Date   PROLACTIN 74.6 (H) 09/28/2019   PROLACTIN 4.2 (L) 12/19/2018   Lab Results  Component Value Date   CHOL 150 04/27/2022   TRIG 66 04/27/2022   HDL 41 04/27/2022   CHOLHDL 3.7 04/27/2022   VLDL 13  04/27/2022   LDLCALC 96 04/27/2022   LDLCALC 74 08/16/2021    Physical Findings: AIMS: Facial and Oral Movements Muscles of Facial Expression: None, normal Lips and Perioral Area: None, normal Jaw: None, normal Tongue: None, normal,Extremity Movements Upper (arms, wrists, hands, fingers): None, normal Lower (legs, knees, ankles, toes): None, normal, Trunk Movements Neck, shoulders, hips: None, normal, Overall Severity Severity of abnormal movements (highest score from questions above): None, normal Incapacitation due to abnormal movements: None, normal [name] awareness of abnormal movements (rate only patient's report): No Awareness, Dental Status Current problems with teeth and/or dentures?: No Does patient usually wear dentures?: No  CIWA:    COWS:     Musculoskeletal: Strength & Muscle Tone: within normal limits Gait & Station: normal Patient leans: N/A  Psychiatric Specialty Exam:  General Appearance: Unkempt, below average hygiene, appears at stated age  Behavior: Cooperative in general  Psychomotor Activity:No psychomotor agitation or retardation noted   Eye Contact: Fair yet limited Speech: Decreased amount, normal tone and volume   Mood: Euthymic Affect: Restricted  Thought Process: Linear, concrete, occasionally tangential but easily redirectable Descriptions of Associations:  occasionally tangential Thought Content: Hallucinations: Denies AH, VH, does not present responding to stimuli Delusions: No paranoia noted during evaluation Suicidal Thoughts: Denies SI, intention, plan  Homicidal Thoughts: Denies HI, intention, plan   Alertness/Orientation: Alert and partially oriented  Insight: Limited Judgment: Limited  Memory: [place]  Community education officer  Concentration: Limited, easily distractible Attention Span: Decreased Recall: limited Fund of Knowledge: limited   Assets  Assets:Desire for Improvement; Financial Resources/Insurance; Housing;  Social Support    Physical  Exam: Physical Exam Vitals and nursing note reviewed.    Review of Systems  All other systems reviewed and are negative. Except reporting had 2-3 BM yesterday, will follow  Blood pressure 106/80, pulse 98, temperature 98.5 F (36.9 C), temperature source Oral, resp. rate 18, height '5\' 6"'$  (1.676 m), weight 104.1 kg, SpO2 99 %. Body mass index is 37.03 kg/m.   Treatment Plan Summary:  ASSESSMENT:  Diagnoses / Active Problems: Principal Problem: Schizoaffective disorder, bipolar type ([place]) Diagnosis: Principal Problem:   Schizoaffective disorder, bipolar type ([place]) Active Problems:   Suicidal ideations   PLAN: Safety and Monitoring:  -- Voluntary admission to inpatient psychiatric unit for safety, stabilization and treatment  -- Daily contact with patient to assess and evaluate symptoms and progress in treatment  -- [name] case to be discussed in multi-disciplinary team meeting  -- Observation Level : q15 minute checks  -- Vital signs:  q12 hours  -- Precautions: suicide, elopement, and assault  2. Medications:   Continue Clozaril titration, today starting 50 mg twice daily, will continue at this dose without further titration given history of stability on this dose Continue Invega 6 mg p.o. at bedtime for psychosis Received first Invega sustena injection 234 mg IM 05/07/22, to be followed by a dose of 156 mg IM prior to dc, to be followed by monthly injection.    Continue Depakote 1000 mg ER at bedtime for mood stabilization  Continue Lipitor 10 mg daily for hyperlipidemia  Continue Glucophage 500 mg daily with breakfast for diabetes  The risks/benefits/side-effects/alternatives to this medication were discussed in detail with the patient and time was given for questions. The patient consents to medication trial.    -- Metabolic profile and EKG monitoring obtained while on an atypical antipsychotic (BMI: Lipid Panel: HbgA1c: QTc:)    3.  Pertinent labs: BMP no significant abnormalities noted, lipid profile within normal limits, CBC no significant abnormalities noted, Depakote level 62 8/31, hemoglobin A1c 5.5, TSH within normal limits, UDS negative at time of admission 05/08/2022 BMP no significant abnormalities noted, CBC slightly low hemoglobin and hematoma crit but no significant abnormalities noted.     Lab ordered: None at this time   4. Group and Therapy: -- Encouraged patient to participate in unit milieu and in scheduled group therapies       -- Short Term Goals: Ability to identify changes in lifestyle to reduce recurrence of condition will improve, Ability to verbalize feelings will improve, Ability to disclose and discuss suicidal ideas, Ability to demonstrate self-control will improve, and Ability to identify and develop effective coping behaviors will improve  -- Long Term Goals: Improvement in symptoms so as ready for discharge  5. Discharge Planning:   -- Social work and case management to assist with discharge planning and identification of hospital follow-up needs prior to discharge  -- Estimated LOS: 5-7 days  -- Discharge Concerns: Need to establish a safety plan; Medication compliance and effectiveness  -- Discharge Goals: Return home with outpatient referrals for mental health follow-up including medication management/psychotherapy      Total Time Spent in Direct Patient Care:  I personally spent 35 minutes on the unit in direct patient care. The direct patient care time included face-to-face time with the patient, reviewing the patient's chart, communicating with other professionals, and coordinating care. Greater than 50% of this time was spent in counseling or coordinating care with the patient regarding goals of hospitalization, psycho-education, and discharge planning needs.   Dian Situ, MD 05/08/2022,  10:39 AM

## 2022-05-09 DIAGNOSIS — F250 Schizoaffective disorder, bipolar type: Secondary | ICD-10-CM | POA: Diagnosis not present

## 2022-05-09 NOTE — Progress Notes (Signed)
Pt affect flat. Pt rates depression 0/10 and anxiety 0/10. Pt reports a good appetite, and no physical problems. Pt denies SI/HI/AVH and verbally contracts for safety. Provided support and encouragement. Pt safe on the unit. Q 15 minute safety checks continued.

## 2022-05-09 NOTE — Group Note (Signed)
Recreation Therapy Group Note   Group Topic:Team Building  Group Date: 05/09/2022 Start Time: 1000 End Time: 1030 Facilitators: Victorino Sparrow, LRT,CTRS Location: [address] Dayroom   Goal Area(s) Addresses:  Patient will effectively work with peer towards shared goal.  Patient will identify skills used to make activity successful.  Patient will identify how skills used during activity can be used to reach post d/c goals.    Group Description: Landing Pad. In teams of 3-5, patients were given 12 plastic drinking straws and an equal length of masking tape. Using the materials provided, patients were asked to build a landing pad to catch a golf ball dropped from approximately 5 feet in the air. All materials were required to be used by the team in their design. LRT facilitated post-activity discussion.   Affect/Mood: N/A   Participation Level: Did not attend    Clinical Observations/Individualized Feedback:     Plan: Continue to engage patient in RT group sessions 2-3x/week.   Victorino Sparrow, Glennis Brink 05/09/2022 12:54 PM

## 2022-05-09 NOTE — BHH Group Notes (Signed)
Adult Psychoeducational Group Note  Date:  05/09/2022 Time:  10:23 AM  Group Topic/Focus:  Goals Group:   The focus of this group is to help patients establish daily goals to achieve during treatment and discuss how the patient can incorporate goal setting into their daily lives to aide in recovery.  Participation Level:  Did Not Attend  Additional Comments:  Patient was told multiple times that group was starting, but still did not attend.   Jamella Grayer T Ria Comment 05/09/2022, 10:23 AM

## 2022-05-09 NOTE — Progress Notes (Signed)
[facility] MD Progress Note  05/09/2022 10:31 AM [name]  MRN:  [id number]   Reason for Admission:  [name] is a 50 y.o. female with a history of  with a psychiatric history of schizoaffective disorder-bipolar type who presented voluntarily from [facility] and was admitted for disorganized thoughts, SI, and paranoia.The patient is currently on Hospital Day 13.   Chart Review from last 24 hours:  The patient's chart was reviewed and nursing notes were reviewed. The patient's case was discussed in multidisciplinary team meeting. Per MAR compliant with scheduled psychotropic medications including clozapine twice daily as well as Depakote daily and Invega at bedtime.  She did receive first dose of Invega monthly injection 05/06/22.  Per staff patient does not present psychotic or paranoid, linear mostly with occasional tangentiality noted.  Per staff, attending some groups, pleasant in general with no issues reported.  Information Obtained Today During Patient Interview: The patient was seen and evaluated in her room, she is lying down in bed, she continues to be able to answer questions in a linear manner in general, does not appear disorganized at this time no paranoia at time of my evaluation noted does not appear responding to stimuli.  Occasionally she presents tangential and nonlinear but easily redirectable.  Able to identify improvement since admission and notes medications are helpful, denies side effects to them. when asked she denies feeling depressed or anxious and denies SI HI or AVH, she continues to deny paranoia from staff here or outside with ACT team. She does agree to comply with medications and follow-up appointments with ACT team after discharge.  She does not display any sign consistent with EPS or TD at time of my evaluation.  Sleep  Sleep: Good per patient's report  Principal Problem: Schizoaffective disorder, bipolar type ([place]) Diagnosis: Principal Problem:   Schizoaffective  disorder, bipolar type ([place]) Active Problems:   Suicidal ideations    Past Psychiatric History: Previous Psych Diagnoses: Schizoaffective- BP type Prior inpatient treatment: [facility] 02/2022; [facility] 09/27/19, 07/2021; July 2020 [place] Current/prior outpatient treatment: [name]  Prior rehab hx: Denies Psychotherapy hx: Denies History of suicide: Reports suicide attempt in college by cutting wrists History of homicide: Denies Psychiatric medication history: Prev trials- Depakote ER, Risperdal (helped to calm her), Abilify PO and LAI, PO paliperidone while titrating LAI; haldol (allergies), prolixin, invega , zyprexa, seroquel, Clozaril (unsure of compliance with regimen) Neuromodulation history: ECT in 1993 Current Psychiatrist: [place] Current therapist: Denies  Past Medical History:  Past Medical History:  Diagnosis Date   Anxiety    Asthma    Bipolar 1 disorder ([place])    Depression    Gallstones 08/2020   Hypertension    Insomnia, persistent    Prediabetes    Schizophrenic disorder ([place])    Seizures ([place])     Past Surgical History:  Procedure Laterality Date   BREAST LUMPECTOMY Left 07/2013   TONSILLECTOMY     TUBAL LIGATION  2002   Family History:  Family History  Problem Relation Age of Onset   Depression Mother    Gout Mother    Cancer Father        prostate   Other Father        lung issue   Alcoholism Other    Heart attack Paternal Grandfather    Heart attack Paternal Grandmother    Heart attack Maternal Grandmother    Heart attack Maternal Grandfather    Depression Son    Anxiety disorder Son  Family Psychiatric  History: Psych: Mother and maternal grandmother with schizophrenia, dad schizoaffective disorder depressive type Psych Rx: Unknown SA/HA: Uncle completed suicide Substance use family hx: Denies Social History: See H&P  Current Medications: Current Facility-Administered Medications  Medication Dose Route Frequency Provider Last  Rate Last Admin   acetaminophen (TYLENOL) tablet 650 mg  650 mg Oral Q6H PRN [name], NP       albuterol (VENTOLIN HFA) 108 (90 Base) MCG/ACT inhaler 2 puff  2 puff Inhalation QID PRN [name], NP       alum & mag hydroxide-simeth (MAALOX/MYLANTA) 200-200-20 MG/5ML suspension 30 mL  30 mL Oral Q4H PRN [name], NP       ascorbic acid (VITAMIN C) tablet 500 mg  500 mg Oral BID [name], NP   500 mg at 05/09/22 0851   atorvastatin (LIPITOR) tablet 10 mg  10 mg Oral Daily [name], NP   10 mg at 05/09/22 0851   cloZAPine (CLOZARIL) tablet 50 mg  50 mg Oral BID Dian Situ, MD   50 mg at 05/09/22 0852   divalproex (DEPAKOTE ER) 24 hr tablet 1,000 mg  1,000 mg Oral QHS [name], NP   1,000 mg at 05/08/22 2122   docusate sodium (COLACE) capsule 100 mg  100 mg Oral Daily [name], NP   100 mg at 05/09/22 0851   feeding supplement (KATE FARMS STANDARD 1.4) liquid 325 mL  325 mL Oral BID PRN Harlow Asa, MD       hydrOXYzine (ATARAX) tablet 25 mg  25 mg Oral TID PRN Harlow Asa, MD   25 mg at 05/05/22 2326   OLANZapine zydis (ZYPREXA) disintegrating tablet 5 mg  5 mg Oral Q8H PRN Harlow Asa, MD       And   LORazepam (ATIVAN) tablet 1 mg  1 mg Oral PRN Harlow Asa, MD       And   ziprasidone (GEODON) injection 20 mg  20 mg Intramuscular PRN Harlow Asa, MD       magnesium hydroxide (MILK OF MAGNESIA) suspension 30 mL  30 mL Oral Daily PRN [name], NP       metFORMIN (GLUCOPHAGE) tablet 500 mg  500 mg Oral Q breakfast [name], NP   500 mg at 05/09/22 0852   [START ON 05/10/2022] paliperidone (INVEGA SUSTENNA) injection 156 mg  156 mg Intramuscular Once Winfred Leeds, Maribella Kuna, MD       paliperidone (INVEGA) 24 hr tablet 6 mg  6 mg Oral QHS Winfred Leeds, Brittney Caraway, MD   6 mg at 05/08/22 2122   traZODone (DESYREL) tablet 50 mg  50 mg Oral QHS PRN Bobbitt, Shalon E, NP        Lab Results:  Results for orders placed or performed during the hospital  encounter of 04/26/22 (from the past 48 hour(s))  Basic metabolic panel     Status: Abnormal   Collection Time: 05/08/22  6:39 AM  Result Value Ref Range   Sodium 142 135 - 145 mmol/L   Potassium 3.9 3.5 - 5.1 mmol/L   Chloride 107 98 - 111 mmol/L   CO2 27 22 - 32 mmol/L   Glucose, Bld 92 70 - 99 mg/dL    Comment: Glucose reference range applies only to samples taken after fasting for at least 8 hours.   BUN 12 6 - 20 mg/dL   Creatinine, Ser 0.87 0.44 - 1.00 mg/dL   Calcium 8.8 (L) 8.9 - 10.3 mg/dL  GFR, Estimated >60 >60 mL/min    Comment: (NOTE) Calculated using the CKD-EPI Creatinine Equation (2021)    Anion gap 8 5 - 15    Comment: Performed at [facility], [place] [address] [place], [place] [address]  CBC with Differential/Platelet     Status: Abnormal   Collection Time: 05/08/22  6:39 AM  Result Value Ref Range   WBC 5.5 4.0 - 10.5 K/uL   RBC 3.94 3.87 - 5.11 MIL/uL   Hemoglobin 11.3 (L) 12.0 - 15.0 g/dL   HCT 35.6 (L) 36.0 - 46.0 %   MCV 90.4 80.0 - 100.0 fL   MCH 28.7 26.0 - 34.0 pg   MCHC 31.7 30.0 - 36.0 g/dL   RDW 13.5 11.5 - 15.5 %   Platelets 201 150 - 400 K/uL   nRBC 0.0 0.0 - 0.2 %   Neutrophils Relative % 57 %   Neutro Abs 3.2 1.7 - 7.7 K/uL   Lymphocytes Relative 29 %   Lymphs Abs 1.6 0.7 - 4.0 K/uL   Monocytes Relative 8 %   Monocytes Absolute 0.4 0.1 - 1.0 K/uL   Eosinophils Relative 5 %   Eosinophils Absolute 0.3 0.0 - 0.5 K/uL   Basophils Relative 0 %   Basophils Absolute 0.0 0.0 - 0.1 K/uL   Immature Granulocytes 1 %   Abs Immature Granulocytes 0.03 0.00 - 0.07 K/uL    Comment: Performed at [facility], [place] [address], [place], [place] [address]    Blood Alcohol level:  Lab Results  Component Value Date   [facility] <10 04/25/2022   ETH <10 62/37/6283    Metabolic Disorder Labs: Lab Results  Component Value Date   HGBA1C 5.5 04/27/2022   MPG 111.15 04/27/2022   MPG 119.76 08/12/2021   Lab Results   Component Value Date   PROLACTIN 74.6 (H) 09/28/2019   PROLACTIN 4.2 (L) 12/19/2018   Lab Results  Component Value Date   CHOL 150 04/27/2022   TRIG 66 04/27/2022   HDL 41 04/27/2022   CHOLHDL 3.7 04/27/2022   VLDL 13 04/27/2022   LDLCALC 96 04/27/2022   LDLCALC 74 08/16/2021    Physical Findings: AIMS: Facial and Oral Movements Muscles of Facial Expression: None, normal Lips and Perioral Area: None, normal Jaw: None, normal Tongue: None, normal,Extremity Movements Upper (arms, wrists, hands, fingers): None, normal Lower (legs, knees, ankles, toes): None, normal, Trunk Movements Neck, shoulders, hips: None, normal, Overall Severity Severity of abnormal movements (highest score from questions above): None, normal Incapacitation due to abnormal movements: None, normal [name] awareness of abnormal movements (rate only patient's report): No Awareness, Dental Status Current problems with teeth and/or dentures?: No Does patient usually wear dentures?: No  CIWA:    COWS:     Musculoskeletal: Strength & Muscle Tone: within normal limits Gait & Station: normal Patient leans: N/A  Psychiatric Specialty Exam:  General Appearance: Unkempt, below average hygiene, appears at stated age  Behavior: Cooperative in general  Psychomotor Activity:No psychomotor agitation or retardation noted   Eye Contact: Fair yet limited Speech: Decreased amount, normal tone and volume   Mood: Euthymic Affect: Restricted  Thought Process: Linear, concrete, occasionally tangential but easily redirectable Descriptions of Associations:  occasionally tangential Thought Content: Hallucinations: Denies AH, VH, does not present responding to stimuli Delusions: No paranoia noted during evaluation Suicidal Thoughts: Denies SI, intention, plan  Homicidal Thoughts: Denies HI, intention, plan   Alertness/Orientation: Alert and partially oriented  Insight: Limited Judgment: Limited  Memory:  Dietitian  Concentration: Limited, easily distractible Attention Span: Decreased Recall: limited Fund of Knowledge: limited   Assets  Assets:Desire for Improvement; Financial Resources/Insurance; Housing; Social Support    Physical Exam: Physical Exam Vitals and nursing note reviewed.    Review of Systems  All other systems reviewed and are negative. Except reporting had 2-3 BM yesterday, will follow  Blood pressure 110/83, pulse (!) 108, temperature 98.8 F (37.1 C), temperature source Oral, resp. rate 16, height '5\' 6"'$  (1.676 m), weight 104.1 kg, SpO2 98 %. Body mass index is 37.03 kg/m.   Treatment Plan Summary:  ASSESSMENT:  Diagnoses / Active Problems: Principal Problem: Schizoaffective disorder, bipolar type ([place]) Diagnosis: Principal Problem:   Schizoaffective disorder, bipolar type ([place]) Active Problems:   Suicidal ideations   PLAN: Safety and Monitoring:  -- Voluntary admission to inpatient psychiatric unit for safety, stabilization and treatment  -- Daily contact with patient to assess and evaluate symptoms and progress in treatment  -- [name] case to be discussed in multi-disciplinary team meeting  -- Observation Level : q15 minute checks  -- Vital signs:  q12 hours  -- Precautions: suicide, elopement, and assault  2. Medications:   Continue Clozaril titration 50 mg twice daily, will continue at this dose without further titration given history of stability on this dose Continue Invega 6 mg p.o. at bedtime for psychosis, will discontinue at discharge after receiving second loading dose Received first Invega sustena injection 234 mg IM 05/07/22, to be followed by a dose of 156 mg IM on 9/13 prior to dc, to be followed by 234 mg monthly injection.  Given history of decompensation on second months of InvegaTrinza injection, patient may need [place] injection given every 3 weeks instead of 4 weeks.  Continue Depakote 1000 mg ER at  bedtime for mood stabilization  Continue Lipitor 10 mg daily for hyperlipidemia  Continue Glucophage 500 mg daily with breakfast for diabetes  The risks/benefits/side-effects/alternatives to this medication were discussed in detail with the patient and time was given for questions. The patient consents to medication trial.    -- Metabolic profile and EKG monitoring obtained while on an atypical antipsychotic (BMI: Lipid Panel: HbgA1c: QTc:)    3. Pertinent labs: BMP no significant abnormalities noted, lipid profile within normal limits, CBC no significant abnormalities noted, Depakote level 62 8/31, hemoglobin A1c 5.5, TSH within normal limits, UDS negative at time of admission 05/08/2022 BMP no significant abnormalities noted, CBC slightly low hemoglobin and hematoma crit but no significant abnormalities noted.     Lab ordered: None at this time   4. Group and Therapy: -- Encouraged patient to participate in unit milieu and in scheduled group therapies       -- Short Term Goals: Ability to identify changes in lifestyle to reduce recurrence of condition will improve, Ability to verbalize feelings will improve, Ability to disclose and discuss suicidal ideas, Ability to demonstrate self-control will improve, and Ability to identify and develop effective coping behaviors will improve  -- Long Term Goals: Improvement in symptoms so as ready for discharge  5. Discharge Planning:   -- Social work and case management to assist with discharge planning and identification of hospital follow-up needs prior to discharge  -- Estimated LOS: 5-7 days  -- Discharge Concerns: Need to establish a safety plan; Medication compliance and effectiveness  -- Discharge Goals: Return home with outpatient referrals for mental health follow-up including medication management/psychotherapy  Will be discharged to the care  of the ACT team on 9/13    Total Time Spent in Direct Patient Care:  I personally spent 35 minutes  on the unit in direct patient care. The direct patient care time included face-to-face time with the patient, reviewing the patient's chart, communicating with other professionals, and coordinating care. Greater than 50% of this time was spent in counseling or coordinating care with the patient regarding goals of hospitalization, psycho-education, and discharge planning needs.   Jaley Yan Winfred Leeds, MD 05/09/2022, 10:31 AM

## 2022-05-09 NOTE — Progress Notes (Signed)
The focus of this group is to help patients review their daily goal of treatment and discuss progress on daily workbooks. Pt did not attend the evening group. 

## 2022-05-09 NOTE — Progress Notes (Signed)
   05/08/22 2200  Psych Admission Type (Psych Patients Only)  Admission Status Voluntary  Psychosocial Assessment  Patient Complaints Isolation  Eye Contact Fair  Facial Expression Fixed smile  Affect Appropriate to circumstance  Speech Logical/coherent  Interaction Assertive  Motor Activity Slow  Appearance/Hygiene Unremarkable  Behavior Characteristics Cooperative  Mood Pleasant  Thought Process  Coherency WDL  Content WDL  Delusions None reported or observed  Perception WDL  Hallucination None reported or observed  Judgment Limited  Confusion None  Danger to Self  Current suicidal ideation? Denies  Self-Injurious Behavior No self-injurious ideation or behavior indicators observed or expressed   Agreement Not to Harm Self Yes  Description of Agreement verbal  Danger to Others  Danger to Others None reported or observed

## 2022-05-09 NOTE — Progress Notes (Signed)
   05/09/22 1200  Psych Admission Type (Psych Patients Only)  Admission Status Voluntary  Psychosocial Assessment  Patient Complaints Isolation  Eye Contact Fair  Facial Expression Flat  Affect Appropriate to circumstance  Speech Logical/coherent  Interaction Assertive  Motor Activity Slow  Appearance/Hygiene Unremarkable  Behavior Characteristics Cooperative  Mood Depressed  Thought Process  Coherency WDL  Content WDL  Delusions None reported or observed  Perception WDL  Hallucination None reported or observed  Judgment Limited  Confusion None  Danger to Self  Current suicidal ideation? Denies  Danger to Others  Danger to Others None reported or observed

## 2022-05-10 DIAGNOSIS — F250 Schizoaffective disorder, bipolar type: Secondary | ICD-10-CM | POA: Diagnosis not present

## 2022-05-10 MED ORDER — DOCUSATE SODIUM 100 MG PO CAPS: 100.0000 mg | ORAL_CAPSULE | Freq: Every day | ORAL | 0 refills | Status: DC

## 2022-05-10 MED ORDER — ASCORBIC ACID 500 MG PO TABS: 500.0000 mg | ORAL_TABLET | Freq: Two times a day (BID) | ORAL | 0 refills | Status: DC

## 2022-05-10 MED ORDER — METFORMIN HCL 500 MG PO TABS: 500.0000 mg | ORAL_TABLET | Freq: Every day | ORAL | 0 refills | Status: DC

## 2022-05-10 MED ORDER — INVEGA SUSTENNA 234 MG/1.5ML IM SUSY: 234.0000 mg | PREFILLED_SYRINGE | INTRAMUSCULAR | 0 refills | Status: DC

## 2022-05-10 MED ORDER — ATORVASTATIN CALCIUM 10 MG PO TABS: 10.0000 mg | ORAL_TABLET | Freq: Every day | ORAL | 0 refills | Status: DC

## 2022-05-10 MED ORDER — DIVALPROEX SODIUM ER 500 MG PO TB24: 1000.0000 mg | ORAL_TABLET | Freq: Every day | ORAL | 0 refills | Status: DC

## 2022-05-10 MED ORDER — CLOZAPINE 50 MG PO TABS
50.0000 mg | ORAL_TABLET | Freq: Two times a day (BID) | ORAL | 0 refills | Status: DC
Start: 2022-05-10 — End: 2022-10-22

## 2022-05-10 NOTE — Group Note (Signed)
LCSW Group Therapy Note   Group Date: 05/10/2022 Start Time: 1300 End Time: 1400  Type of Therapy and Topic:  Group Therapy:  Healthy and Unhealthy Supports  Participation Level:  Did Not Attend   Description of Group:  Patients in this group were introduced to the idea of adding a variety of healthy supports to address the various needs in their lives, especially in reference to their plans and focus for the new year.  Patients discussed what additional healthy supports could be helpful in their recovery and wellness after discharge in order to prevent future hospitalizations.   An emphasis was placed on using counselor, doctor, therapy groups, 12-step groups, and problem-specific support groups to expand supports.    Therapeutic Goals:   1)  discuss importance of adding supports to stay well once out of the hospital  2)  compare healthy versus unhealthy supports and identify some examples of each  3)  generate ideas and descriptions of healthy supports that can be added  4)  offer mutual support about how to address unhealthy supports  5)  encourage active participation in and adherence to discharge plan    Summary of Patient Progress:  The patient did not attend group.   Therapeutic Modalities:   Motivational Interviewing Brief Solution-Focused Therapy  Darleen Crocker, Latanya Presser 05/10/2022  1:37 PM

## 2022-05-10 NOTE — Group Note (Unsigned)
Date:  05/10/2022 Time:  9:53 AM  Group Topic/Focus:  Goals Group:   The focus of this group is to help patients establish daily goals to achieve during treatment and discuss how the patient can incorporate goal setting into their daily lives to aide in recovery. Orientation:   The focus of this group is to educate the patient on the purpose and policies of crisis stabilization and provide a format to answer questions about their admission.  The group details unit policies and expectations of patients while admitted.     Participation Level:  {BHH PARTICIPATION KPVVZ:48270}  Participation Quality:  {BHH PARTICIPATION QUALITY:22265}  Affect:  {BHH AFFECT:22266}  Cognitive:  {BHH COGNITIVE:22267}  Insight: {BHH Insight2:20797}  Engagement in Group:  {BHH ENGAGEMENT IN BEMLJ:44920}  Modes of Intervention:  {BHH MODES OF INTERVENTION:22269}  Additional Comments:  ***  [name] 05/10/2022, 9:53 AM

## 2022-05-10 NOTE — Progress Notes (Addendum)
Pt discharged at this time. Pt left facility accompanied by [name] team. Pt removed all belongings, valuables, medications, and prescriptions. Pt verbalized understanding of medications, discharge instructions, and follo wup care. Pt denies SI/HI/self harm thoughts as well as a/v hallucinations.  Prio to discharge pt turned in to nurse a suicide safety plan that was both illegible and incomplete. When nurse explained the importance of document being completed pt states that was the best she could do. Nurse and pt then sat down and completed for together, with pt dictating and nurse transcribing. Pt left with a copy of completed safety plan.

## 2022-05-10 NOTE — [facility] Suicide Risk Assessment (Signed)
[facility] Discharge Suicide Risk Assessment   Principal Problem: Schizoaffective disorder, bipolar type [facility]) Discharge Diagnoses: Principal Problem:   Schizoaffective disorder, bipolar type ([place]) Active Problems:   Suicidal ideations   Total Time spent with patient: 45 minutes  Reason for admission: [name] is a 51 year old female with a psychiatric history of schizoaffective disorder-bipolar type who presented voluntarily from [facility] and admitted for disorganized thoughts, SI, and paranoia.  PTA Medications: Zyprexa 10 mg Invega Trinza 819 mg LAI Depakote ER 1000 mg qHS Atorvastatin 10 mg qHS  Hospital Course:   During the patient's hospitalization, patient had extensive initial psychiatric evaluation, and follow-up psychiatric evaluations every day.  Psychiatric diagnoses provided upon initial assessment: Schizoaffective disorder, bipolar type ([place])  [name] psychiatric medications were adjusted on admission: - Increase to Zyprexa 20 mg nightly - Continue home Depakote ER 1000 mg nightly             Trough level pending, as well as CBC with differential; CMP with LFTs WNL.  CBC with chronically normocytic anemia-hemoglobin 11.2, MCV 90.3, platelets 234, WBC 5.3 -Continue metformin 500 mg every morning for anti-psychotic induced metabolic effects.  During the hospitalization, other adjustments were made to the patient's psychiatric medication regimen: patient with h/o responding to clozapine '50mg'$  bid and invega sustena monthly injection, she was started back on clozapine and was titrated gradually successfully to 50 mg bid, zytprexa was d/ced sec to lack of efficacy....given h/o response to invega, she was restarted on invega po 6 mg daily, good response was noted, became much less psychotic, more linear and organized with stable mood. Then was started on invega sustena 234 mg x 1 and second loading dose 156 mg was given on day of dc with plan to cont invega sustena 234 mg monthly and  clozapine 50 mg bid.Marland KitchenMarland KitchenMarland KitchenDepakote was continued all through hospital stay with no issues noted. During hospital stay, lipitor was cont for hyperlipidemia, and glucophage for antipsychotic induced side effects.  [name] care was discussed during the interdisciplinary team meeting every day during the hospitalization.  The patient denies having side effects to prescribed psychiatric medication except on day of dc she reported mild drooling but " not a problem" she was advised to discuss with her OP [name] team provider if getting worse after dc, may consider cogentin. Noting patient denies any sx and didn't didn't display any signs cons with EPS or TD all through hospital stay and at time of dc.  [name] improved gradually with meds changes noted above, she was noted to be back to baseline prior to dc. Gradually, patient started adjusting to milieu. The patient was evaluated each day by a clinical provider to ascertain response to treatment. Improvement was noted by the patient's report of decreasing symptoms, improved sleep and appetite, affect, medication tolerance, behavior, and participation in unit programming.  Patient was asked each day to complete a self inventory noting mood, mental status, pain, new symptoms, anxiety and concerns.    Symptoms were reported as significantly decreased or resolved completely by discharge.   On day of discharge, patient was evaluated on 05/10/2022, the patient reports that their mood is stable. The patient denied having suicidal thoughts for more than 48 hours prior to discharge.  Patient denies having homicidal thoughts.  Patient denies having auditory hallucinations.  Patient denies any visual hallucinations or other symptoms of psychosis. The patient was motivated to continue taking medication with a goal of continued improvement in mental health.   The patient reports their  target psychiatric symptoms of mood, irritability psychosis and paranoia  responded well  to the psychiatric medications, and the patient reports overall benefit other psychiatric hospitalization. Supportive psychotherapy was provided to the patient. The patient also participated in regular group therapy while hospitalized. Coping skills, problem solving as well as relaxation therapies were also part of the unit programming.  Labs were reviewed with the patient, and abnormal results were discussed with the patient.  The patient is able to verbalize their individual safety plan to this provider.  Behavioral Events: none  Restraints: none  Groups:attended some groups with fair participation  Medications Changes: as above  D/C Medications: as noted below  Sleep  Sleep:No data recorded  Musculoskeletal: Strength & Muscle Tone: within normal limits Gait & Station: normal Patient leans: N/A  Psychiatric Specialty Exam  General Appearance: appears at stated age, fairly dressed and groomed  Behavior: pleasant and cooperative  Psychomotor Activity:No psychomotor agitation or retardation noted   Eye Contact: good Speech: normal amount, tone, volume and latency   Mood: euthymic Affect: congruent, pleasant and interactive  Thought Process: linear, goal directed, no circumstantial or tangential thought process noted, no racing thoughts or flight of ideas Descriptions of Associations: intact Thought Content: Hallucinations: denies AH, VH , does not appear responding to stimuli Delusions: No paranoia or other delusions noted Suicidal Thoughts: denies SI, intention, plan  Homicidal Thoughts: denies HI, intention, plan   Alertness/Orientation: alert and fully oriented  Insight: fair, improved Judgment: fair, improved  Memory: intact  Executive Functions  Concentration: intact  Attention Span: Fair Recall: intact Fund of Knowledge: fair   Executive Functions  Concentration: intact Attention Span: Fair Recall: intact Fund of Knowledge: fair   Assets   Assets:Desire for Improvement; Financial Resources/Insurance; Housing; Social Support   Physical Exam: Physical Exam ROS Blood pressure 133/71, pulse 100, temperature 98.2 F (36.8 C), temperature source Oral, resp. rate 16, height '5\' 6"'$  (1.676 m), weight 104.1 kg, SpO2 98 %. Body mass index is 37.03 kg/m.  Mental Status Per Nursing Assessment::   On Admission:  Suicidal ideation indicated by patient  Demographic Factors:  Low socioeconomic status and Unemployed  Loss Factors: NA  Historical Factors: NA  Risk Reduction Factors:   Positive therapeutic relationship through [name] team staff  Continued Clinical Symptoms: improved significantly during hospital stay Bipolar Disorder:   Mixed State Schizophrenia:   Paranoid or undifferentiated type  Cognitive Features That Contribute To Risk:  Closed-mindedness and Polarized thinking    Suicide Risk:  Minimal: No identifiable suicidal ideation.  Patients presenting with no risk factors but with morbid ruminations; may be classified as minimal risk based on the severity of the depressive symptoms   Follow-up Information     [facility] Follow up.   Why: [name] services will resume immediately at discharge. Contact information: [address]  [place] [address] [phone]                 Plan Of Care/Follow-up recommendations:   Discharge recommendations:    Activity: as tolerated  Diet: heart healthy  # It is recommended to the patient to continue psychiatric medications as prescribed, after discharge from the hospital.     # It is recommended to the patient to follow up with your outpatient psychiatric provider and PCP.   # It was discussed with the patient, the impact of alcohol, drugs, tobacco have been there overall psychiatric and medical wellbeing, and total abstinence from substance use was recommended the patient.ed.   #  Prescriptions provided or sent directly to preferred pharmacy at  discharge. Patient agreeable to plan. Given opportunity to ask questions. Appears to feel comfortable with discharge.    # In the event of worsening symptoms, the patient is instructed to call the crisis hotline, 911 and or go to the nearest ED for appropriate evaluation and treatment of symptoms. To follow-up with primary care provider for other medical issues, concerns and or health care needs   # Patient was discharged home with a plan to follow up as noted above.  -Follow-up with outpatient primary care doctor and other specialists -for management of chronic medical disease, including: hyperlipidemia, also on metformin for metabolic Se sec to antipsychotics.   Patient agrees with D/C instructions and plan.  The patient received suicide prevention pamphlet:  Yes Belongings returned:  Clothing and Valuables  Total Time Spent in Direct Patient Care:  I personally spent 45 minutes on the unit in direct patient care. The direct patient care time included face-to-face time with the patient, reviewing the patient's chart, communicating with other professionals, and coordinating care. Greater than 50% of this time was spent in counseling or coordinating care with the patient regarding goals of hospitalization, psycho-education, and discharge planning needs.   [name] 05/10/2022, 9:11 AM   Yvette Tunks Winfred Leeds, MD 05/10/2022, 9:11 AM

## 2022-05-10 NOTE — Discharge Summary (Signed)
Physician Discharge Summary Note  Patient:  [name] is an 51 y.o., female MRN:  [id number] DOB:  [birth year] Patient phone:  [phone] (home)  Patient address:   [address] [place] [address],  Total Time spent with patient: 45 minutes  Date of Admission:  04/26/2022 Date of Discharge: 05/10/2022  Reason for Admission:  Latrisha [name] is a 51 year old female with a psychiatric history of schizoaffective disorder-bipolar type who presented voluntarily from [facility] and admitted for disorganized thoughts, SI, and paranoia.  Principal Problem: Schizoaffective disorder, bipolar type [facility]) Discharge Diagnoses: Principal Problem:   Schizoaffective disorder, bipolar type ([place]) Active Problems:   Suicidal ideations   Past Psychiatric History: Previous Psych Diagnoses: Schizoaffective- BP type Prior inpatient treatment: [facility] 02/2022; [facility] 09/27/19, 07/2021; July 2020 [place] Current/prior outpatient treatment: [name]  Prior rehab hx: Denies Psychotherapy hx: Denies History of suicide: Reports suicide attempt in college by cutting wrists History of homicide: Denies Psychiatric medication history: Prev trials- Depakote ER, Risperdal (helped to calm her), Abilify PO and LAI, PO paliperidone while titrating LAI; haldol (allergies), prolixin, invega , zyprexa, seroquel, Clozaril (unsure of compliance with regimen) Neuromodulation history: ECT in 1993 Current Psychiatrist: [facility] Current therapist: Denies  Past Medical History:  Past Medical History:  Diagnosis Date   Anxiety    Asthma    Bipolar 1 disorder ([place])    Depression    Gallstones 08/2020   Hypertension    Insomnia, persistent    Prediabetes    Schizophrenic disorder ([place])    Seizures ([place])     Past Surgical History:  Procedure Laterality Date   BREAST LUMPECTOMY Left 07/2013   TONSILLECTOMY     TUBAL LIGATION  2002   Family History:  Family History  Problem Relation Age of Onset    Depression Mother    Gout Mother    Cancer Father        prostate   Other Father        lung issue   Alcoholism Other    Heart attack Paternal Grandfather    Heart attack Paternal Grandmother    Heart attack Maternal Grandmother    Heart attack Maternal Grandfather    Depression Son    Anxiety disorder Son    Family Psychiatric  History: Psych: Mother and maternal grandmother with schizophrenia, dad schizoaffective disorder depressive type Psych Rx: Unknown SA/HA: Uncle completed suicide Substance use family hx: Denies Social History:  Social History   Substance and Sexual Activity  Alcohol Use Not Currently     Social History   Substance and Sexual Activity  Drug Use No    Social History   Socioeconomic History   Marital status: Single    Spouse name: Not on file   Number of children: Not on file   Years of education: Not on file   Highest education level: Not on file  Occupational History   Not on file  Tobacco Use   Smoking status: Former    Types: Cigarettes   Smokeless tobacco: Never  Vaping Use   Vaping Use: Never used  Substance and Sexual Activity   Alcohol use: Not Currently   Drug use: No   Sexual activity: Not Currently  Other Topics Concern   Not on file  Social History Narrative   Not on file   Social Determinants of Health   Financial Resource Strain: Not on file  Food Insecurity: Food Insecurity Present (09/16/2020)   Hunger Vital Sign  Worried About Charity fundraiser in the Last Year: Sometimes true    [employer] of Food in the Last Year: Sometimes true  Transportation Needs: No Transportation Needs (09/16/2020)   PRAPARE - Hydrologist (Medical): No    Lack of Transportation (Non-Medical): No  Physical Activity: Not on file  Stress: Not on file  Social Connections: Not on file    Hospital Course:  During the patient's hospitalization, patient had extensive initial psychiatric evaluation, and follow-up  psychiatric evaluations every day.   Psychiatric diagnoses provided upon initial assessment: Schizoaffective disorder, bipolar type ([place])   [name] psychiatric medications were adjusted on admission: - Increase to Zyprexa 20 mg nightly - Continue home Depakote ER 1000 mg nightly             Trough level pending, as well as CBC with differential; CMP with LFTs WNL.  CBC with chronically normocytic anemia-hemoglobin 11.2, MCV 90.3, platelets 234, WBC 5.3 -Continue metformin 500 mg every morning for anti-psychotic induced metabolic effects.   During the hospitalization, other adjustments were made to the patient's psychiatric medication regimen: patient with h/o responding to clozapine '50mg'$  bid and invega sustena monthly injection, she was started back on clozapine and was titrated gradually successfully to 50 mg bid, zytprexa was d/ced sec to lack of efficacy....given h/o response to invega, she was restarted on invega po 6 mg daily, good response was noted, became much less psychotic, more linear and organized with stable mood. Then was started on invega sustena 234 mg x 1 and second loading dose 156 mg was given on day of dc with plan to cont invega sustena 234 mg monthly and clozapine 50 mg bid.Marland KitchenMarland KitchenMarland KitchenDepakote was continued all through hospital stay with no issues noted. During hospital stay, lipitor was cont for hyperlipidemia, and glucophage for antipsychotic induced side effects.   [name] care was discussed during the interdisciplinary team meeting every day during the hospitalization.   The patient denies having side effects to prescribed psychiatric medication except on day of dc she reported mild drooling but " not a problem" she was advised to discuss with her OP [name] team provider if getting worse after dc, may consider cogentin. Noting patient denies any sx and didn't didn't display any signs cons with EPS or TD all through hospital stay and at time of dc.   [name] improved gradually  with meds changes noted above, she was noted to be back to baseline prior to dc. Gradually, patient started adjusting to milieu. The patient was evaluated each day by a clinical provider to ascertain response to treatment. Improvement was noted by the patient's report of decreasing symptoms, improved sleep and appetite, affect, medication tolerance, behavior, and participation in unit programming.  Patient was asked each day to complete a self inventory noting mood, mental status, pain, new symptoms, anxiety and concerns.     Symptoms were reported as significantly decreased or resolved completely by discharge.    On day of discharge, patient was evaluated on 05/10/2022, the patient reports that their mood is stable. The patient denied having suicidal thoughts for more than 48 hours prior to discharge.  Patient denies having homicidal thoughts.  Patient denies having auditory hallucinations.  Patient denies any visual hallucinations or other symptoms of psychosis. The patient was motivated to continue taking medication with a goal of continued improvement in mental health.    The patient reports their target psychiatric symptoms of mood, irritability psychosis and paranoia  responded well  to the psychiatric medications, and the patient reports overall benefit other psychiatric hospitalization. Supportive psychotherapy was provided to the patient. The patient also participated in regular group therapy while hospitalized. Coping skills, problem solving as well as relaxation therapies were also part of the unit programming.   Labs were reviewed with the patient, and abnormal results were discussed with the patient.   The patient is able to verbalize their individual safety plan to this provider.   Behavioral Events: none   Restraints: none   Groups:attended some groups with fair participation   Medications Changes: as above   D/C Medications: as noted below  Physical Findings: AIMS: Facial and  Oral Movements Muscles of Facial Expression: None, normal Lips and Perioral Area: None, normal Jaw: None, normal Tongue: None, normal,Extremity Movements Upper (arms, wrists, hands, fingers): None, normal Lower (legs, knees, ankles, toes): None, normal, Trunk Movements Neck, shoulders, hips: None, normal, Overall Severity Severity of abnormal movements (highest score from questions above): None, normal Incapacitation due to abnormal movements: None, normal [name] awareness of abnormal movements (rate only patient's report): No Awareness, Dental Status Current problems with teeth and/or dentures?: No Does patient usually wear dentures?: No  CIWA:    COWS:     Musculoskeletal: Strength & Muscle Tone: within normal limits Gait & Station: normal Patient leans: N/A   Psychiatric Specialty Exam:  General Appearance: appears at stated age, fairly dressed and groomed  Behavior: pleasant and cooperative  Psychomotor Activity:No psychomotor agitation or retardation noted   Eye Contact: good Speech: normal amount, tone, volume and latency   Mood: euthymic Affect: congruent, pleasant and interactive  Thought Process: linear, goal directed, no circumstantial or tangential thought process noted, no racing thoughts or flight of ideas Descriptions of Associations: intact Thought Content: Hallucinations: denies AH, VH , does not appear responding to stimuli Delusions: No paranoia or other delusions noted Suicidal Thoughts: denies SI, intention, plan  Homicidal Thoughts: denies HI, intention, plan   Alertness/Orientation: alert and fully oriented  Insight: fair, improved Judgment: fair, improved  Memory: intact  Executive Functions  Concentration: intact  Attention Span: Fair Recall: intact Fund of Knowledge: fair   Assets  Assets:Desire for Improvement; Financial Resources/Insurance; Housing; Social Support   Sleep Sleep: Good, improved during hospital  stay   Physical Exam:  Physical Exam Vitals and nursing note reviewed.  Constitutional:      Appearance: Normal appearance.  HENT:     Head: Normocephalic and atraumatic.     Nose: Nose normal.  Pulmonary:     Effort: Pulmonary effort is normal.  Neurological:     General: No focal deficit present.     Mental Status: She is alert and oriented to person, place, and time.    Review of Systems  Constitutional: Negative.   HENT: Negative.    Eyes: Negative.   Respiratory: Negative.    Cardiovascular: Negative.   Gastrointestinal: Negative.   Genitourinary: Negative.   Musculoskeletal: Negative.   Skin: Negative.   Neurological: Negative.    Blood pressure 133/71, pulse 100, temperature 98.2 F (36.8 C), temperature source Oral, resp. rate 16, height '5\' 6"'$  (1.676 m), weight 104.1 kg, SpO2 98 %. Body mass index is 37.03 kg/m.   Social History   Tobacco Use  Smoking Status Former   Types: Cigarettes  Smokeless Tobacco Never   Tobacco Cessation:  N/A, patient does not currently use tobacco products   Blood Alcohol level:  Lab Results  Component Value Date   ETH <10 04/25/2022  ETH <10 97/35/3299    Metabolic Disorder Labs:  Lab Results  Component Value Date   HGBA1C 5.5 04/27/2022   MPG 111.15 04/27/2022   MPG 119.76 08/12/2021   Lab Results  Component Value Date   PROLACTIN 74.6 (H) 09/28/2019   PROLACTIN 4.2 (L) 12/19/2018   Lab Results  Component Value Date   CHOL 150 04/27/2022   TRIG 66 04/27/2022   HDL 41 04/27/2022   CHOLHDL 3.7 04/27/2022   VLDL 13 04/27/2022   LDLCALC 96 04/27/2022   LDLCALC 74 08/16/2021    See Psychiatric Specialty Exam and Suicide Risk Assessment completed by Attending Physician prior to discharge.  Discharge destination:  Home with [name] team staff  Is patient on multiple antipsychotic therapies at discharge:  No   Has Patient had three or more failed trials of antipsychotic monotherapy by history:  Yes,    Antipsychotic medications that previously failed include:   3.  Zyprexa, invega alone , abilify, haldol, prolixin and risperidone.  Recommended Plan for Multiple Antipsychotic Therapies: Second antipsychotic is Clozapine.  Reason for adding Clozapine refractory treatment, history of good response to invega sustena and clozapine  Discharge Instructions     Diet - low sodium heart healthy   Complete by: As directed    Increase activity slowly   Complete by: As directed       Allergies as of 05/10/2022       Reactions   Milk-related Compounds Nausea And Vomiting   Pollen Extract Other (See Comments)   Seasonal allergies   Pork-derived Products Nausea Only   Haldol [haloperidol Decanoate] Swelling, Anxiety, Other (See Comments)   Stiffness, eyes bulging   Penicillins Nausea And Vomiting, Swelling, Rash   Has patient had a PCN reaction causing immediate rash, facial/tongue/throat swelling, SOB or lightheadedness with hypotension:UNSURE  Has patient had a PCN reaction causing severe rash involving mucus membranes or skin necrosis: UNSURE Has patient had a PCN reaction that required hospitalization:UNSURE Has patient had a PCN reaction occurring within the last 10 years:No If all of the above answers are "NO", then may proceed with Cephalosporin use. CHILDHOOD REACTION   Shrimp [shellfish Allergy] Nausea Only, Rash, Other (See Comments)   Raw shrimp        Medication List     STOP taking these medications    Invega Trinza 819 MG/2.63ML injection Generic drug: paliperidone Palmitate ER Replaced by: Invega Sustenna 234 MG/1.5ML injection   OLANZapine 10 MG tablet Commonly known as: ZYPREXA       TAKE these medications      Indication  albuterol 108 (90 Base) MCG/ACT inhaler Commonly known as: VENTOLIN HFA Inhale 2 puffs into the lungs 4 (four) times daily as needed for wheezing or shortness of breath.  Indication: Asthma   ascorbic acid 500 MG tablet Commonly known  as: VITAMIN C Take 1 tablet (500 mg total) by mouth 2 (two) times daily. What changed:  how much to take when to take this  Indication: Inadequate Vitamin C   atorvastatin 10 MG tablet Commonly known as: LIPITOR Take 1 tablet (10 mg total) by mouth at bedtime.  Indication: High Amount of Fats in the Blood   clozapine 50 MG tablet Commonly known as: CLOZARIL Take 1 tablet (50 mg total) by mouth 2 (two) times daily.  Indication: Schizophrenia   divalproex 500 MG 24 hr tablet Commonly known as: DEPAKOTE ER Take 2 tablets (1,000 mg total) by mouth at bedtime.  Indication: Manic Phase of Manic-Depression  docusate sodium 100 MG capsule Commonly known as: COLACE Take 1 capsule (100 mg total) by mouth daily.  Indication: Constipation   Invega Sustenna 234 MG/1.5ML injection Generic drug: paliperidone Inject 234 mg into the muscle every 28 (twenty-eight) days. Start taking on: June 02, 2022 Replaces: Lorayne Bender Trinza 819 MG/2.63ML injection  Indication: Schizophrenia   metFORMIN 500 MG tablet Commonly known as: GLUCOPHAGE Take 1 tablet (500 mg total) by mouth daily with breakfast.  Indication: Antipsychotic Therapy-Induced Weight Gain        Follow-up Information     [facility] Follow up on 05/10/2022.   Why: [name] services will resume immediately at discharge.  [name] will be at your home today, Thursday, and Friday. Contact information: [address]  [place] [address] [phone]                 Discharge recommendations:   Activity: as tolerated  Diet: heart healthy  # It is recommended to the patient to continue psychiatric medications as prescribed, after discharge from the hospital.     # It is recommended to the patient to follow up with your outpatient psychiatric provider and PCP.   # It was discussed with the patient, the impact of alcohol, drugs, tobacco have been there overall psychiatric and medical wellbeing, and total  abstinence from substance use was recommended the patient.ed.   # Prescriptions provided or sent directly to preferred pharmacy at discharge. Patient agreeable to plan. Given opportunity to ask questions. Appears to feel comfortable with discharge.    # In the event of worsening symptoms, the patient is instructed to call the crisis hotline, 911 and or go to the nearest ED for appropriate evaluation and treatment of symptoms. To follow-up with primary care provider for other medical issues, concerns and or health care needs   # Patient was discharged home with a plan to follow up as noted above.  -Follow-up with outpatient primary care doctor and other specialists -for management of chronic medical disease, including: hyperlipidemia, also on metformin for metabolic Se sec to antipsychotics.   Patient agrees with D/C instructions and plan.   The patient received suicide prevention pamphlet:  Yes Belongings returned:  Clothing and Valuables  Total Time Spent in Direct Patient Care:  I personally spent 45 minutes on the unit in direct patient care. The direct patient care time included face-to-face time with the patient, reviewing the patient's chart, communicating with other professionals, and coordinating care. Greater than 50% of this time was spent in counseling or coordinating care with the patient regarding goals of hospitalization, psycho-education, and discharge planning needs.    SignedDian Situ, MD 05/10/2022, 2:48 PM

## 2022-05-10 NOTE — Group Note (Signed)
Date:  05/10/2022 Time:  11:14 AM  Group Topic/Focus:  Goals Group:   The focus of this group is to help patients establish daily goals to achieve during treatment and discuss how the patient can incorporate goal setting into their daily lives to aide in recovery. Orientation:   The focus of this group is to educate the patient on the purpose and policies of crisis stabilization and provide a format to answer questions about their admission.  The group details unit policies and expectations of patients while admitted.    Participation Level:  Active  Participation Quality:  Appropriate  Affect:  Appropriate  Cognitive:  Appropriate  Insight: Appropriate  Engagement in Group:  Engaged  Modes of Intervention:  Discussion  Additional Comments:  Pt wants to follow the rules and discharge planning  [name] 05/10/2022, 11:14 AM

## 2022-05-10 NOTE — Progress Notes (Signed)
  [facility] Adult Case Management Discharge Plan :  Will you be returning to the same living situation after discharge:  Yes,  Own Home  At discharge, do you have transportation home?: Yes,  [facility] Do you have the ability to pay for your medications: Yes,  [employer]   Release of information consent forms completed and in the chart;  [name] signature needed at discharge.  Patient to Follow up at:  Follow-up Information     [facility] Follow up on 05/10/2022.   Why: [name] services will resume immediately at discharge.  [name] will be at your home today, Thursday, and Friday. Contact information: [place]  [place] [address] [phone]                 Next level of care provider has access to [place] and Suicide Prevention discussed: Yes,  with patient, daughter, and ACT Team      Has patient been referred to the Quitline?: N/A patient is not a smoker  Patient has been referred for addiction treatment: N/A  Darleen Crocker, [place] 05/10/2022, 9:24 AM

## 2022-05-10 NOTE — Group Note (Unsigned)
Date:  05/10/2022 Time:  9:55 AM  Group Topic/Focus:  Goals Group:   The focus of this group is to help patients establish daily goals to achieve during treatment and discuss how the patient can incorporate goal setting into their daily lives to aide in recovery. Orientation:   The focus of this group is to educate the patient on the purpose and policies of crisis stabilization and provide a format to answer questions about their admission.  The group details unit policies and expectations of patients while admitted.     Participation Level:  {BHH PARTICIPATION NGITJ:95974}  Participation Quality:  {BHH PARTICIPATION QUALITY:22265}  Affect:  {BHH AFFECT:22266}  Cognitive:  {BHH COGNITIVE:22267}  Insight: {BHH Insight2:20797}  Engagement in Group:  {BHH ENGAGEMENT IN XVEZB:01586}  Modes of Intervention:  {BHH MODES OF INTERVENTION:22269}  Additional Comments:  ***  [name] 05/10/2022, 9:55 AM

## 2022-05-21 ENCOUNTER — Encounter (HOSPITAL_COMMUNITY): Payer: Self-pay

## 2022-05-21 ENCOUNTER — Other Ambulatory Visit: Payer: Self-pay

## 2022-05-21 ENCOUNTER — Emergency Department (HOSPITAL_COMMUNITY)
Admission: EM | Admit: 2022-05-21 | Discharge: 2022-05-23 | Disposition: A | Discharge status: Home / Self Care | Payer: Medicare Other | Attending: Emergency Medicine | Admitting: Emergency Medicine

## 2022-05-21 DIAGNOSIS — Z20822 Contact with and (suspected) exposure to covid-19: Secondary | ICD-10-CM | POA: Insufficient documentation

## 2022-05-21 DIAGNOSIS — R7303 Prediabetes: Secondary | ICD-10-CM | POA: Diagnosis not present

## 2022-05-21 DIAGNOSIS — Z794 Long term (current) use of insulin: Secondary | ICD-10-CM | POA: Diagnosis not present

## 2022-05-21 DIAGNOSIS — F201 Disorganized schizophrenia: Secondary | ICD-10-CM

## 2022-05-21 DIAGNOSIS — F309 Manic episode, unspecified: Secondary | ICD-10-CM | POA: Diagnosis not present

## 2022-05-21 DIAGNOSIS — F250 Schizoaffective disorder, bipolar type: Secondary | ICD-10-CM | POA: Diagnosis present

## 2022-05-21 DIAGNOSIS — Z79899 Other long term (current) drug therapy: Secondary | ICD-10-CM | POA: Diagnosis not present

## 2022-05-21 DIAGNOSIS — Z1339 Encounter for screening examination for other mental health and behavioral disorders: Secondary | ICD-10-CM | POA: Diagnosis not present
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
## 2022-05-22 DIAGNOSIS — F250 Schizoaffective disorder, bipolar type: Secondary | ICD-10-CM | POA: Diagnosis not present

## 2022-05-22 LAB — RESP PANEL BY RT-PCR (FLU A&B, COVID) ARPGX2
Influenza A by PCR: NEGATIVE
Influenza B by PCR: NEGATIVE
SARS Coronavirus 2 by RT PCR: NEGATIVE

## 2022-05-22 LAB — RAPID URINE DRUG SCREEN, HOSP PERFORMED
Amphetamines: NOT DETECTED
Barbiturates: NOT DETECTED
Benzodiazepines: NOT DETECTED
Cocaine: NOT DETECTED
Opiates: NOT DETECTED
Tetrahydrocannabinol: NOT DETECTED

## 2022-05-22 LAB — ETHANOL: Alcohol, Ethyl (B): <10 mg/dL (ref <10)

## 2022-05-22 LAB — CBC WITH DIFFERENTIAL/PLATELET
Abs Immature Granulocytes: 0.04 10*3/uL (ref 0.00–0.07)
Basophils Absolute: 0 10*3/uL (ref 0.0–0.1)
Basophils Relative: 0 %
Eosinophils Absolute: 0.4 10*3/uL (ref 0.0–0.5)
Eosinophils Relative: 5 %
HCT: 35.1 % — ABNORMAL LOW (ref 36.0–46.0)
Hemoglobin: 11 g/dL — ABNORMAL LOW (ref 12.0–15.0)
Immature Granulocytes: 1 %
Lymphocytes Relative: 33 %
Lymphs Abs: 2.4 10*3/uL (ref 0.7–4.0)
MCH: 28.9 pg (ref 26.0–34.0)
MCHC: 31.3 g/dL (ref 30.0–36.0)
MCV: 92.4 fL (ref 80.0–100.0)
Monocytes Absolute: 0.5 10*3/uL (ref 0.1–1.0)
Monocytes Relative: 7 %
Neutro Abs: 3.8 10*3/uL (ref 1.7–7.7)
Neutrophils Relative %: 54 %
Platelets: 273 10*3/uL (ref 150–400)
RBC: 3.8 MIL/uL — ABNORMAL LOW (ref 3.87–5.11)
RDW: 14.8 % (ref 11.5–15.5)
WBC: 7.1 10*3/uL (ref 4.0–10.5)
nRBC: 0 % (ref 0.0–0.2)

## 2022-05-22 LAB — COMPREHENSIVE METABOLIC PANEL
ALT: 36 U/L (ref 0–44)
AST: 22 U/L (ref 15–41)
Albumin: 4.1 g/dL (ref 3.5–5.0)
Alkaline Phosphatase: 58 U/L (ref 38–126)
Anion gap: 7 (ref 5–15)
BUN: 14 mg/dL (ref 6–20)
CO2: 25 mmol/L (ref 22–32)
Calcium: 9.4 mg/dL (ref 8.9–10.3)
Chloride: 108 mmol/L (ref 98–111)
Creatinine, Ser: 0.8 mg/dL (ref 0.44–1.00)
GFR, Estimated: >60 mL/min (ref >60)
Glucose, Bld: 103 mg/dL — ABNORMAL HIGH (ref 70–99)
Potassium: 3.6 mmol/L (ref 3.5–5.1)
Sodium: 140 mmol/L (ref 135–145)
Total Bilirubin: 0.5 mg/dL (ref 0.3–1.2)
Total Protein: 8.6 g/dL — ABNORMAL HIGH (ref 6.5–8.1)

## 2022-05-22 LAB — SALICYLATE LEVEL: Salicylate Lvl: <7 mg/dL — ABNORMAL LOW (ref 7.0–30.0)

## 2022-05-22 LAB — CBG MONITORING, ED: Glucose-Capillary: 104 mg/dL — ABNORMAL HIGH (ref 70–99)

## 2022-05-22 LAB — ACETAMINOPHEN LEVEL: Acetaminophen (Tylenol), Serum: <10 ug/mL — ABNORMAL LOW (ref 10–30)

## 2022-05-22 MED ORDER — METFORMIN HCL 500 MG PO TABS
500.0000 mg | ORAL_TABLET | Freq: Every day | ORAL | Status: DC
  Administered 2022-05-22 – 2022-05-23 (×2): 500 mg via ORAL
  Filled 2022-05-22 (×2): qty 1

## 2022-05-22 MED ORDER — DOCUSATE SODIUM 100 MG PO CAPS
100.0000 mg | ORAL_CAPSULE | Freq: Every day | ORAL | Status: DC
  Administered 2022-05-22 – 2022-05-23 (×2): 100 mg via ORAL
  Filled 2022-05-22 (×2): qty 1

## 2022-05-22 MED ORDER — CLOZAPINE 25 MG PO TABS
50.0000 mg | ORAL_TABLET | Freq: Two times a day (BID) | ORAL | Status: DC
  Administered 2022-05-22 – 2022-05-23 (×4): 50 mg via ORAL
  Filled 2022-05-22 (×5): qty 2

## 2022-05-22 MED ORDER — ATORVASTATIN CALCIUM 10 MG PO TABS
10.0000 mg | ORAL_TABLET | Freq: Every day | ORAL | Status: DC
  Administered 2022-05-22: 10 mg via ORAL
  Filled 2022-05-22: qty 1

## 2022-05-22 MED ORDER — DIVALPROEX SODIUM ER 500 MG PO TB24
1000.0000 mg | ORAL_TABLET | Freq: Every day | ORAL | Status: DC
  Administered 2022-05-22: 1000 mg via ORAL
  Filled 2022-05-22: qty 2

## 2022-05-22 NOTE — ED Notes (Signed)
Patient belongings in triage nursing station. Patient has been changed into appropriate scrubs.

## 2022-05-22 NOTE — Consult Note (Signed)
[place] ED ASSESSMENT   Reason for Consult:  Psychiatric evaluation Referring Physician:  ER Physician Patient Identification: [name] MRN:  [id number] ED Chief Complaint: Schizoaffective disorder, bipolar type [facility])  Diagnosis:  Principal Problem:   Schizoaffective disorder, bipolar type [facility])   ED Assessment Time Calculation: Start Time: 1610 Stop Time: 1645 Total Time in Minutes (Assessment Completion): 35   Subjective:   [name] is a 52 y.o. female patient admitted  to the ER yesterday with manic type episode.  She has a hx of Schizophrenia and Bipolar disorder.  Patient was hospitalized last month at [facility].  Patient came in complaining of warm crawling all over her and had altercation with people she did not see.  HPI:  Patient was seen this morning alert and oriented x4.  She reported that she has been having rough time since she left the hospital.  She lives alone and stated she could not find her medications.   Patient also reported that [place] trinza which she receives every 3 months is not effective.  Patient reports that each tine she takes this injection the effect last only 4-6 weeks.  Patient want to get back on Monthly injection instead of every three months.  She receives care at [facility].  Efforts to speak with providers at [facility] all day failed but a staff finally reported to writer Lorayne Bender is now Monthly injection and this is due October the 6th. [name], patient's daughter believes that patient going home without care and a dose of the injection until October the 6th will not be safe for patient. Patient is alert and calm.  Speech is normal, less pressured and she is agreement to get her injection soon. DR Tamala Julian, ACT  team provider called  and spoke with this provider regarding patient's Medications and mood.  DR Tamala Julian reported recent visit to [facility] hospital and stated that at the time patient was hypo manic and not stable.  DR Tamala Julian reported that Clozaril was started  recently and by the visit at [facility] patient did not start Clozaril.  He saw patient at the outpatient clinic and advised her to take her Clozaril and he at that visit changed the invega to sustenna.  DR Tamala Julian believes that patient is not compliant with Clozaril and and that the protocol that goes with Clozaril will be difficult for her to manage.   DR Tamala Julian agrees to offer the injection earlier this Wednesday or Thursday at her home.  Aisha, patient's daughter will come pick her mother up and she will monitor her until she receives the [place] .  We will monitor overnight and reevaluate in am.  Past Psychiatric History: Previous Psych Diagnoses: Schizoaffective- BP type Prior inpatient treatment: [facility] 02/2022; [facility] 09/27/19, 07/2021; July 2020 [place]/prior outpatient treatment: [name].  Previous ECT in 1993.  Tried various Psychotropic medications in the past.   Risk to Self or Others: Is the patient at risk to self? No Has the patient been a risk to self in the past 6 months? No Has the patient been a risk to self within the distant past? No Is the patient a risk to others? No Has the patient been a risk to others in the past 6 months? No Has the patient been a risk to others within the distant past? No  [place] Scale:  [place] ED from 05/21/2022 in [place] DEPT Admission (Discharged) from 04/26/2022 in [place] 400B Pre-admit ([place])  from 12/19/2018 in [place] 500B  C-SSRS RISK CATEGORY Error: Q3, 4, or 5 should not be populated when Q2 is No Moderate Risk No Risk       AIMS:  , , ,  ,   ASAM: ASAM Multidimensional Assessment Summary DImension 1:  Acute Intoxication and/or Withdrawal Potential Severity Rating: None Dimension 2:  Biomedical Conditions and Complications Severity Rating: None Dimension 3:  Emotional, behavioral or cognitive (EBC) conditions  and complications severity rating: None Dimension 4:  Readiness to Change Severity Rating: None Dimension 5:  Relapse, continued use, or continued problem potential severity rating: None Dimension 6:  Recovery/living environment severity rating: None  Substance Abuse:  Alcohol / Drug Use Pain Medications: See MAR Prescriptions: See MAR Over the Counter: See MAR History of alcohol / drug use?: No history of alcohol / drug abuse Longest period of sobriety (when/how long): NA Negative Consequences of Use:  (denies) Withdrawal Symptoms:  (no sx of w/d )  Past Medical History:  Past Medical History:  Diagnosis Date   Anxiety    Asthma    Bipolar 1 disorder ([place])    Depression    Gallstones 08/2020   Hypertension    Insomnia, persistent    Prediabetes    Schizophrenic disorder (HCC)    Seizures ([place])     Past Surgical History:  Procedure Laterality Date   BREAST LUMPECTOMY Left 07/2013   TONSILLECTOMY     TUBAL LIGATION  2002   Family History:  Family History  Problem Relation Age of Onset   Depression Mother    Gout Mother    Cancer Father        prostate   Other Father        lung issue   Alcoholism Other    Heart attack Paternal Grandfather    Heart attack Paternal Grandmother    Heart attack Maternal Grandmother    Heart attack Maternal Grandfather    Depression Son    Anxiety disorder Son    Family Psychiatric  History: Mother and maternal grandmother with schizophrenia, dad schizoaffective disorder depressive type.  Uncle completed suicide Social History:  Social History   Substance and Sexual Activity  Alcohol Use Not Currently     Social History   Substance and Sexual Activity  Drug Use No    Social History   Socioeconomic History   Marital status: Single    Spouse name: Not on file   Number of children: Not on file   Years of education: Not on file   Highest education level: Not on file  Occupational History   Not on file  Tobacco Use    Smoking status: Former    Types: Cigarettes   Smokeless tobacco: Never  Vaping Use   Vaping Use: Never used  Substance and Sexual Activity   Alcohol use: Not Currently   Drug use: No   Sexual activity: Not Currently  Other Topics Concern   Not on file  Social History Narrative   Not on file   Social Determinants of Health   Financial Resource Strain: Not on file  Food Insecurity: Food Insecurity Present (09/16/2020)   Hunger Vital Sign    Worried About Running Out of Food in the Last Year: Sometimes true    Ran Out of Food in the Last Year: Sometimes true  Transportation Needs: No Transportation Needs (09/16/2020)   PRAPARE - Transportation    Lack of Transportation (Medical): No    Lack  of Transportation (Non-Medical): No  Physical Activity: Not on file  Stress: Not on file  Social Connections: Not on file   Additional Social History:    Allergies:   Allergies  Allergen Reactions   Milk-Related Compounds Nausea And Vomiting   Pollen Extract Other (See Comments)    Seasonal allergies   Pork-Derived Products Nausea Only   Haldol [Haloperidol Decanoate] Swelling, Anxiety and Other (See Comments)    Stiffness, eyes bulging   Penicillins Nausea And Vomiting, Swelling and Rash    Has patient had a PCN reaction causing immediate rash, facial/tongue/throat swelling, SOB or lightheadedness with hypotension:UNSURE  Has patient had a PCN reaction causing severe rash involving mucus membranes or skin necrosis: UNSURE Has patient had a PCN reaction that required hospitalization:UNSURE Has patient had a PCN reaction occurring within the last 10 years:No If all of the above answers are "NO", then may proceed with Cephalosporin use. CHILDHOOD REACTION   Shrimp [Shellfish Allergy] Nausea Only, Rash and Other (See Comments)    Raw shrimp    Labs:  Results for orders placed or performed during the hospital encounter of 05/21/22 (from the past 48 hour(s))  Comprehensive metabolic  panel     Status: Abnormal   Collection Time: 05/22/22  2:35 AM  Result Value Ref Range   Sodium 140 135 - 145 mmol/L   Potassium 3.6 3.5 - 5.1 mmol/L   Chloride 108 98 - 111 mmol/L   CO2 25 22 - 32 mmol/L   Glucose, Bld 103 (H) 70 - 99 mg/dL    Comment: Glucose reference range applies only to samples taken after fasting for at least 8 hours.   BUN 14 6 - 20 mg/dL   Creatinine, Ser 0.80 0.44 - 1.00 mg/dL   Calcium 9.4 8.9 - 10.3 mg/dL   Total Protein 8.6 (H) 6.5 - 8.1 g/dL   Albumin 4.1 3.5 - 5.0 g/dL   AST 22 15 - 41 U/L   ALT 36 0 - 44 U/L   Alkaline Phosphatase 58 38 - 126 U/L   Total Bilirubin 0.5 0.3 - 1.2 mg/dL   GFR, Estimated >60 >60 mL/min    Comment: (NOTE) Calculated using the CKD-EPI Creatinine Equation (2021)    Anion gap 7 5 - 15    Comment: Performed at [facility], [place] [address] [place], [place] [address]  Salicylate level     Status: Abnormal   Collection Time: 05/22/22  2:35 AM  Result Value Ref Range   Salicylate Lvl <4.3 (L) 7.0 - 30.0 mg/dL    Comment: Performed at [facility], [place] [address], [place], [place] [address]  Acetaminophen level     Status: Abnormal   Collection Time: 05/22/22  2:35 AM  Result Value Ref Range   Acetaminophen (Tylenol), Serum <10 (L) 10 - 30 ug/mL    Comment: (NOTE) Therapeutic concentrations vary significantly. A range of 10-30 ug/mL  may be an effective concentration for many patients. However, some  are best treated at concentrations outside of this range. Acetaminophen concentrations >150 ug/mL at 4 hours after ingestion  and >50 ug/mL at 12 hours after ingestion are often associated with  toxic reactions.  Performed at [facility], [place] [address], [place], [place] [address]   Ethanol     Status: None   Collection Time: 05/22/22  2:35 AM  Result Value Ref Range   Alcohol, Ethyl (B) <10 <10 mg/dL    Comment: (NOTE) Lowest detectable limit  for serum alcohol  is 10 mg/dL.  For medical purposes only. Performed at [facility], [place] [address], [place], [place] [address]   CBC WITH DIFFERENTIAL     Status: Abnormal   Collection Time: 05/22/22  2:35 AM  Result Value Ref Range   WBC 7.1 4.0 - 10.5 K/uL   RBC 3.80 (L) 3.87 - 5.11 MIL/uL   Hemoglobin 11.0 (L) 12.0 - 15.0 g/dL   HCT 35.1 (L) 36.0 - 46.0 %   MCV 92.4 80.0 - 100.0 fL   MCH 28.9 26.0 - 34.0 pg   MCHC 31.3 30.0 - 36.0 g/dL   RDW 14.8 11.5 - 15.5 %   Platelets 273 150 - 400 K/uL   nRBC 0.0 0.0 - 0.2 %   Neutrophils Relative % 54 %   Neutro Abs 3.8 1.7 - 7.7 K/uL   Lymphocytes Relative 33 %   Lymphs Abs 2.4 0.7 - 4.0 K/uL   Monocytes Relative 7 %   Monocytes Absolute 0.5 0.1 - 1.0 K/uL   Eosinophils Relative 5 %   Eosinophils Absolute 0.4 0.0 - 0.5 K/uL   Basophils Relative 0 %   Basophils Absolute 0.0 0.0 - 0.1 K/uL   Immature Granulocytes 1 %   Abs Immature Granulocytes 0.04 0.00 - 0.07 K/uL    Comment: Performed at [facility], [place] [address] [place], [place] [address]  Resp Panel by RT-PCR (Flu A&B, Covid) Anterior Nasal Swab     Status: None   Collection Time: 05/22/22  2:44 AM   Specimen: Anterior Nasal Swab  Result Value Ref Range   SARS Coronavirus 2 by RT PCR NEGATIVE NEGATIVE    Comment: (NOTE) SARS-CoV-2 target nucleic acids are NOT DETECTED.  The SARS-CoV-2 RNA is generally detectable in upper respiratory specimens during the acute phase of infection. The lowest concentration of SARS-CoV-2 viral copies this assay can detect is 138 copies/mL. A negative result does not preclude SARS-Cov-2 infection and should not be used as the sole basis for treatment or other patient management decisions. A negative result may occur with  improper specimen collection/handling, submission of specimen other than nasopharyngeal swab, presence of viral mutation(s) within the areas targeted by this assay, and inadequate number of  viral copies(<138 copies/mL). A negative result must be combined with clinical observations, patient history, and epidemiological information. The expected result is Negative.  Fact Sheet for Patients:  EntrepreneurPulse.com.au  Fact Sheet for Healthcare Providers:  IncredibleEmployment.be  This test is no t yet approved or cleared by the [place] FDA and  has been authorized for detection and/or diagnosis of SARS-CoV-2 by FDA under an Emergency Use Authorization (EUA). This EUA will remain  in effect (meaning this test can be used) for the duration of the COVID-19 declaration under Section 564(b)(1) of the Act, 21 U.S.C.section 360bbb-3(b)(1), unless the authorization is terminated  or revoked sooner.       Influenza A by PCR NEGATIVE NEGATIVE   Influenza B by PCR NEGATIVE NEGATIVE    Comment: (NOTE) The Xpert Xpress SARS-CoV-2/FLU/RSV plus assay is intended as an aid in the diagnosis of influenza from Nasopharyngeal swab specimens and should not be used as a sole basis for treatment. Nasal washings and aspirates are unacceptable for Xpert Xpress SARS-CoV-2/FLU/RSV testing.  Fact Sheet for Patients: EntrepreneurPulse.com.au  Fact Sheet for Healthcare Providers: IncredibleEmployment.be  This test is not yet approved or cleared by the [place] FDA and has been authorized for detection and/or diagnosis of SARS-CoV-2  by FDA under an Emergency Use Authorization (EUA). This EUA will remain in effect (meaning this test can be used) for the duration of the COVID-19 declaration under Section 564(b)(1) of the Act, 21 U.S.C. section 360bbb-3(b)(1), unless the authorization is terminated or revoked.  Performed at [facility], [place] [address], [place], [place] [address]   CBG monitoring, ED     Status: Abnormal   Collection Time: 05/22/22  2:55 AM  Result Value Ref Range    Glucose-Capillary 104 (H) 70 - 99 mg/dL    Comment: Glucose reference range applies only to samples taken after fasting for at least 8 hours.  Urine rapid drug screen (hosp performed)     Status: None   Collection Time: 05/22/22  3:10 AM  Result Value Ref Range   Opiates NONE DETECTED NONE DETECTED   Cocaine NONE DETECTED NONE DETECTED   Benzodiazepines NONE DETECTED NONE DETECTED   Amphetamines NONE DETECTED NONE DETECTED   Tetrahydrocannabinol NONE DETECTED NONE DETECTED   Barbiturates NONE DETECTED NONE DETECTED    Comment: (NOTE) DRUG SCREEN FOR MEDICAL PURPOSES ONLY.  IF CONFIRMATION IS NEEDED FOR ANY PURPOSE, NOTIFY LAB WITHIN 5 DAYS.  LOWEST DETECTABLE LIMITS FOR URINE DRUG SCREEN Drug Class                     Cutoff (ng/mL) Amphetamine and metabolites    1000 Barbiturate and metabolites    200 Benzodiazepine                 270 Tricyclics and metabolites     300 Opiates and metabolites        300 Cocaine and metabolites        300 THC                            50 Performed at [facility], [place] [address] [place], [place] [address]     Current Facility-Administered Medications  Medication Dose Route Frequency Provider Last Rate Last Admin   atorvastatin (LIPITOR) tablet 10 mg  10 mg Oral QHS Montine Circle, PA-C       cloZAPine (CLOZARIL) tablet 50 mg  50 mg Oral BID Montine Circle, PA-C   50 mg at 05/22/22 1043   divalproex (DEPAKOTE ER) 24 hr tablet 1,000 mg  1,000 mg Oral QHS Montine Circle, PA-C       docusate sodium (COLACE) capsule 100 mg  100 mg Oral Daily Montine Circle, PA-C   100 mg at 05/22/22 1043   metFORMIN (GLUCOPHAGE) tablet 500 mg  500 mg Oral Q breakfast Montine Circle, PA-C   500 mg at 05/22/22 1043   Current Outpatient Medications  Medication Sig Dispense Refill   albuterol (VENTOLIN HFA) 108 (90 Base) MCG/ACT inhaler Inhale 2 puffs into the lungs 4 (four) times daily as needed for wheezing or shortness of breath.      ascorbic acid (VITAMIN C) 500 MG tablet Take 1 tablet (500 mg total) by mouth 2 (two) times daily. 60 tablet 0   divalproex (DEPAKOTE ER) 500 MG 24 hr tablet Take 2 tablets (1,000 mg total) by mouth at bedtime. 60 tablet 0   [START ON 06/02/2022] paliperidone (INVEGA SUSTENNA) 234 MG/1.5ML injection Inject 234 mg into the muscle every 28 (twenty-eight) days. 1.5 mL 0   atorvastatin (LIPITOR) 10 MG tablet Take 1 tablet (10 mg total) by mouth at bedtime. (Patient not taking: Reported on 05/22/2022)  30 tablet 0   cloZAPine (CLOZARIL) 50 MG tablet Take 1 tablet (50 mg total) by mouth 2 (two) times daily. (Patient not taking: Reported on 05/22/2022) 60 tablet 0   docusate sodium (COLACE) 100 MG capsule Take 1 capsule (100 mg total) by mouth daily. (Patient not taking: Reported on 05/22/2022) 30 capsule 0   metFORMIN (GLUCOPHAGE) 500 MG tablet Take 1 tablet (500 mg total) by mouth daily with breakfast. (Patient not taking: Reported on 05/22/2022) 60 tablet 0    Musculoskeletal: Strength & Muscle Tone: within normal limits Gait & Station: normal Patient leans: Front   Psychiatric Specialty Exam: Presentation  General Appearance: Disheveled  Eye Contact:Fair  Speech:Clear and Coherent; Normal Rate  Speech Volume:Normal  Handedness:Right   Mood and Affect  Mood:Depressed  Affect:Congruent   Thought Process  Thought Processes:Goal Directed; Coherent  Descriptions of Associations:Intact  Orientation:Full (Time, Place and Person)  Thought Content:Logical  History of Schizophrenia/Schizoaffective disorder:Yes  Duration of Psychotic Symptoms:Greater than six months  Hallucinations:Hallucinations: None  Ideas of Reference:None  Suicidal Thoughts:Suicidal Thoughts: No  Homicidal Thoughts:Homicidal Thoughts: No   Sensorium  Memory:Immediate Good; Recent Good; Remote Good  Judgment:Fair  Insight:Fair   Executive Functions  Concentration:Fair  Attention  Span:Fair  [place]   Psychomotor Activity  Psychomotor Activity:Psychomotor Activity: Normal   Assets  Assets:Communication Skills; Housing; Social Support    Sleep  Sleep:Sleep: Good   Physical Exam: Physical Exam Constitutional:      Appearance: She is obese.  HENT:     Head: Normocephalic and atraumatic.     Nose: Nose normal.  Cardiovascular:     Rate and Rhythm: Normal rate.  Pulmonary:     Effort: Pulmonary effort is normal.  Musculoskeletal:        General: Normal range of motion.     Cervical back: Normal range of motion.  Skin:    General: Skin is warm and dry.  Neurological:     Mental Status: She is alert and oriented to person, place, and time.    Review of Systems  Constitutional: Negative.   HENT: Negative.    Eyes: Negative.   Respiratory: Negative.    Cardiovascular: Negative.   Musculoskeletal: Negative.   Skin: Negative.   Neurological: Negative.   Endo/Heme/Allergies: Negative.   Psychiatric/Behavioral:  Positive for depression. The patient is nervous/anxious.    Blood pressure (!) 117/100, pulse 83, temperature 98 F (36.7 C), temperature source Oral, resp. rate 18, height '5\' 6"'$  (1.676 m), weight 95.3 kg, SpO2 100 %. Body mass index is 33.89 kg/m.  Medical Decision Making: Patient appears calm and not a danger to self or others.  She resumed home medications at this time.  Her LAI is changed to Monthly and she received last dose of Monthly dose 6th of this month.  Next Injection will be October 6th by DR Tamala Julian, her ACT Team Physician plans to give the injection on Wednesday or Thursday this week.  We will reevaluate in am and determine appropriate disposition.  Daughter is open to taking her home tomorrow and monitor her until she gets the injection.  Problem 1: Schizoaffective disorder, Bipolar type  Disposition:  Monitor over night and revaluate in am for appropriate  disposition.  Delfin Gant, NP-PMHNP-BC 05/22/2022 5:04 PM
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
## 2022-05-22 NOTE — Progress Notes (Signed)
BHH/BMU LCSW Progress Note   05/22/2022    12:12 PM  [name]   [id number]   Type of Contact and Topic: Psychiatric Bed Placement   Patient was referred out by direction of Hampton Abbot, MD, Medical Director due to limited bed availability and high demand.   Patient meets inpatient criteria per [employer], [place]. Patient referred to the following facilities:  Destination Service Provider Request Status Address Phone Fax  [facility] [address], [place] [address] [phone]  [place] [address] [place] [address] [phone] [place], [place] [address] [phone] [place] [address] Jeanene Erb [place] [address] [phone]  [facility] [address], [place] [address] [phone] [place] [address] Diamantina Monks [place] [address] [phone] [place]  Pending - [place], [place] [address] [place]  [facility]  Pending - Request Sent [address] [place] [address] [phone]    CSW will continue to monitor disposition.   Signed:  Durenda Hurt, MSW, [place], LCASA 05/22/2022 12:12 PM

## 2022-05-22 NOTE — ED Provider Notes (Signed)
[facility] Emergency Department Provider Note MRN:  [id number]  Arrival date & time: 05/22/22     Chief Complaint   Psychiatric Evaluation   History of Present Illness   [name] is a 51 y.o. year-old female presents to the ED with chief complaint of manic type episode.  Has history of schizophrenia and bipolar.  States she has not had her medications.  Recently seen for the same.  Unable to provide clear history due to flight of ideas and tangential thoughts.  History provided by patient.   Review of Systems  Pertinent positive and negative review of systems noted in HPI.    Physical Exam   Vitals:   05/21/22 2304  BP: (!) 126/95  Pulse: 90  Resp: 17  Temp: 99 F (37.2 C)  SpO2: 95%    CONSTITUTIONAL:  anxious-appearing, NAD NEURO:  Alert and oriented x 3, CN 3-12 grossly intact EYES:  eyes equal and reactive ENT/NECK:  Supple, no stridor  CARDIO:  normal rate, regular rhythm, appears well-perfused  PULM:  No respiratory distress,  GI/GU:  non-distended,  MSK/SPINE:  No gross deformities, no edema, moves all extremities  SKIN:  no rash, atraumatic PSYCH: flight of idea, tangential speaking   *Additional and/or pertinent findings included in MDM below  Diagnostic and Interventional Summary    EKG Interpretation  Date/Time:    Ventricular Rate:    PR Interval:    QRS Duration:   QT Interval:    QTC Calculation:   R Axis:     Text Interpretation:         Labs Reviewed  COMPREHENSIVE METABOLIC PANEL - Abnormal; Notable for the following components:      Result Value   Glucose, Bld 103 (*)    Total Protein 8.6 (*)    All other components within normal limits  SALICYLATE LEVEL - Abnormal; Notable for the following components:   Salicylate Lvl <3.1 (*)    All other components within normal limits  ACETAMINOPHEN LEVEL - Abnormal; Notable for the following components:   Acetaminophen (Tylenol), Serum <10 (*)    All other  components within normal limits  CBC WITH DIFFERENTIAL/PLATELET - Abnormal; Notable for the following components:   RBC 3.80 (*)    Hemoglobin 11.0 (*)    HCT 35.1 (*)    All other components within normal limits  CBG MONITORING, ED - Abnormal; Notable for the following components:   Glucose-Capillary 104 (*)    All other components within normal limits  RESP PANEL BY RT-PCR (FLU A&B, COVID) ARPGX2  ETHANOL  RAPID URINE DRUG SCREEN, HOSP PERFORMED  I-STAT BETA HCG BLOOD, ED ([facility], [facility], [facility] ONLY)    No orders to display    Medications  metFORMIN (GLUCOPHAGE) tablet 500 mg (has no administration in time range)  docusate sodium (COLACE) capsule 100 mg (has no administration in time range)  divalproex (DEPAKOTE ER) 24 hr tablet 1,000 mg (has no administration in time range)  cloZAPine (CLOZARIL) tablet 50 mg (50 mg Oral Given 05/22/22 0229)  atorvastatin (LIPITOR) tablet 10 mg (has no administration in time range)     Procedures  /  Critical Care Procedures  ED Course and Medical Decision Making  I have reviewed the triage vital signs, the nursing notes, and pertinent available records from the EMR.  Social Determinants Affecting Complexity of Care: Patient has no clinically significant social determinants affecting this chief complaint..   ED Course:    Medical Decision Making Patient  here with flight of ideas and manic type behavior.  Will consult TTS.  Will reorder home meds.  Amount and/or Complexity of Data Reviewed Labs: ordered.  Risk OTC drugs. Prescription drug management.     Consultants: TTS consult pending   Treatment and Plan: Dispo pending TTS eval.    Final Clinical Impressions(s) / ED Diagnoses     ICD-10-CM   1. 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

## 2022-05-23 DIAGNOSIS — F250 Schizoaffective disorder, bipolar type: Secondary | ICD-10-CM

## 2022-05-23 MED ORDER — METFORMIN HCL 500 MG PO TABS: 500.0000 mg | ORAL_TABLET | Freq: Every day | ORAL | 0 refills | Status: DC

## 2022-05-23 NOTE — ED Notes (Signed)
Patient discharged off unit per provider. Patient alert, calm, cooperative, no s/s of distress. Discharge information given to patient.  Belongings given to patient. Patient ambulatory off unit, escorted by RN. Patient walked to lobby. Patient transported by family.
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
## 2022-05-23 NOTE — ED Provider Notes (Signed)
Emergency Medicine Observation Re-evaluation Note  [name] is a 51 y.o. female, seen on rounds today.  Pt initially presented to the ED for complaints of Psychiatric Evaluation Currently, the patient is sleeping.  Physical Exam  BP 124/88 (BP Location: Left Arm)   Pulse 84   Temp 98 F (36.7 C) (Oral)   Resp 18   Ht '5\' 6"'$  (1.676 m)   Wt 95.3 kg   SpO2 97%   BMI 33.89 kg/m  Physical Exam General: Sleeping Cardiac: Extremities well-perfused Lungs: Breathing is unlabored Psych: Deferred  ED Course / MDM  EKG:   I have reviewed the labs performed to date as well as medications administered while in observation.  Recent changes in the last 24 hours include none.  Plan  Current plan is for psychiatry reevaluation this morning.    Godfrey Pick, MD 05/23/22 [phone]

## 2022-05-23 NOTE — ED Notes (Signed)
Patient is pleasant, calm, and cooperative. She slept most of the shift.

## 2022-09-15 ENCOUNTER — Other Ambulatory Visit: Payer: Self-pay

## 2022-09-15 ENCOUNTER — Encounter (HOSPITAL_COMMUNITY): Payer: Self-pay

## 2022-09-15 ENCOUNTER — Emergency Department (HOSPITAL_COMMUNITY)
Admission: EM | Admit: 2022-09-15 | Discharge: 2022-09-15 | Disposition: A | Discharge status: Home / Self Care | Payer: Medicare HMO | Attending: Emergency Medicine | Admitting: Emergency Medicine

## 2022-09-15 DIAGNOSIS — R110 Nausea: Secondary | ICD-10-CM | POA: Insufficient documentation

## 2022-09-15 DIAGNOSIS — Z760 Encounter for issue of repeat prescription: Secondary | ICD-10-CM | POA: Diagnosis not present

## 2022-09-15 DIAGNOSIS — I10 Essential (primary) hypertension: Secondary | ICD-10-CM | POA: Diagnosis not present

## 2022-09-15 DIAGNOSIS — F250 Schizoaffective disorder, bipolar type: Secondary | ICD-10-CM | POA: Insufficient documentation

## 2022-09-15 MED ORDER — ONDANSETRON 4 MG PO TBDP: 4.0000 mg | ORAL_TABLET | Freq: Three times a day (TID) | ORAL | 0 refills | Status: DC | PRN

## 2022-09-15 MED ORDER — TRAZODONE HCL 50 MG PO TABS: 50.0000 mg | ORAL_TABLET | Freq: Every evening | ORAL | 0 refills | Status: DC | PRN

## 2022-09-15 MED ORDER — METFORMIN HCL 500 MG PO TABS: 500.0000 mg | ORAL_TABLET | Freq: Every day | ORAL | 0 refills | Status: DC

## 2022-09-15 NOTE — ED Provider Notes (Signed)
[place] DEPT Provider Note   CSN: [id number] Arrival date & time: 09/15/22  0030     History  Chief Complaint  Patient presents with   Medication Refill    [name] is a 52 y.o. female.   Medication Refill Patient is a 52 year old female past medical history significant for schizoaffective disorder bipolar type, medication noncompliance, but HTN   She is present emergency room today with request for medication refill.  She states that she would like to have trazodone and Ambien refilled.  She understands that an Ambien refill is something she will need to doctor primary care doctor about.  She states that she is not currently established with a primary care provider.  She is requesting recommendations for PCP follow-up.     Home Medications Prior to Admission medications   Medication Sig Start Date End Date Taking? Authorizing Provider  traZODone (DESYREL) 50 MG tablet Take 1 tablet (50 mg total) by mouth at bedtime as needed for sleep. 09/15/22  Yes Jorie Zee S, PA  albuterol (VENTOLIN HFA) 108 (90 Base) MCG/ACT inhaler Inhale 2 puffs into the lungs 4 (four) times daily as needed for wheezing or shortness of breath.    [provider]  ascorbic acid (VITAMIN C) 500 MG tablet Take 1 tablet (500 mg total) by mouth 2 (two) times daily. 05/10/22   Dian Situ, MD  atorvastatin (LIPITOR) 10 MG tablet Take 1 tablet (10 mg total) by mouth at bedtime. Patient not taking: Reported on 05/22/2022 05/10/22   Dian Situ, MD  cloZAPine (CLOZARIL) 50 MG tablet Take 1 tablet (50 mg total) by mouth 2 (two) times daily. Patient not taking: Reported on 05/22/2022 05/10/22   Dian Situ, MD  divalproex (DEPAKOTE ER) 500 MG 24 hr tablet Take 2 tablets (1,000 mg total) by mouth at bedtime. 05/10/22   Dian Situ, MD  docusate sodium (COLACE) 100 MG capsule Take 1 capsule (100 mg total) by mouth daily. Patient not taking: Reported on 05/22/2022  05/10/22   Dian Situ, MD  metFORMIN (GLUCOPHAGE) 500 MG tablet Take 1 tablet (500 mg total) by mouth daily with breakfast. 05/24/22 06/23/22  Delfin Gant, NP  paliperidone (INVEGA SUSTENNA) 234 MG/1.5ML injection Inject 234 mg into the muscle every 28 (twenty-eight) days. 06/02/22   Dian Situ, MD  ferrous sulfate 325 (65 FE) MG tablet Take 1 tablet (325 mg total) by mouth daily with breakfast. Patient not taking: No sig reported 12/24/18 04/20/19  [name], MD      Allergies    Milk-related compounds, Pollen extract, Pork-derived products, Haldol [haloperidol decanoate], Penicillins, and Shrimp [shellfish allergy]    Review of Systems   Review of Systems  Physical Exam Updated Vital Signs BP (!) 120/95   Pulse 80   Temp 98 F (36.7 C) (Oral)   Resp 18   Ht '5\' 6"'$  (1.676 m)   Wt 95.3 kg   SpO2 100%   BMI 33.91 kg/m  Physical Exam Vitals and nursing note reviewed.  Constitutional:      General: She is not in acute distress.    Appearance: Normal appearance. She is not ill-appearing.  HENT:     Head: Normocephalic and atraumatic.  Eyes:     General: No scleral icterus.       Right eye: No discharge.        Left eye: No discharge.     Conjunctiva/sclera: Conjunctivae normal.  Pulmonary:     Effort: Pulmonary effort is normal.  Breath sounds: No stridor.  Skin:    General: Skin is warm and dry.  Neurological:     Mental Status: She is alert and oriented to person, place, and time. Mental status is at baseline.     ED Results / Procedures / Treatments   Labs (all labs ordered are listed, but only abnormal results are displayed) Labs Reviewed - No data to display  EKG None  Radiology No results found.  Procedures Procedures    Medications Ordered in ED Medications - No data to display  ED Course/ Medical Decision Making/ A&P                             Medical Decision Making Risk Prescription drug management.   Patient is here  requesting medication refill.  She has no complaints at this time apart from some mild nausea.  Will give Zofran and recommend follow-up with PCP.  Return precautions were discussed.  Patient also given the information for the [facility] health and wellness clinic abdomen soft nontender.  Overall well-appearing, vital signs normal.   Final Clinical Impression(s) / ED Diagnoses Final diagnoses:  Medication refill    Rx / DC Orders ED Discharge Orders          Ordered    traZODone (DESYREL) 50 MG tablet  At bedtime PRN        09/15/22 0218              Tedd Sias, PA 09/15/22 0417    Orpah Greek, MD 09/16/22 [phone]

## 2022-09-15 NOTE — Discharge Instructions (Addendum)
I have given you the information for the [facility] health wellness clinic.  I have written you a refill of your metformin as well as your atorvastatin which is a medicine taken for cholesterol/hyperlipidemia and I also prescribed you trazodone

## 2022-09-15 NOTE — ED Triage Notes (Signed)
Pt states that she needs some Trazodone or Ambien. Pt was recently on these medications but does not have a prescription.

## 2022-10-21 ENCOUNTER — Other Ambulatory Visit: Payer: Self-pay

## 2022-10-21 ENCOUNTER — Emergency Department (HOSPITAL_COMMUNITY): Payer: 59

## 2022-10-21 ENCOUNTER — Encounter (HOSPITAL_COMMUNITY): Payer: Self-pay

## 2022-10-21 ENCOUNTER — Emergency Department (HOSPITAL_COMMUNITY)
Admission: EM | Admit: 2022-10-21 | Discharge: 2022-10-22 | Disposition: A | Discharge status: Home / Self Care | Payer: 59 | Attending: Emergency Medicine | Admitting: Emergency Medicine

## 2022-10-21 DIAGNOSIS — R0781 Pleurodynia: Secondary | ICD-10-CM | POA: Insufficient documentation

## 2022-10-21 DIAGNOSIS — Z7951 Long term (current) use of inhaled steroids: Secondary | ICD-10-CM | POA: Diagnosis not present

## 2022-10-21 DIAGNOSIS — R059 Cough, unspecified: Secondary | ICD-10-CM | POA: Insufficient documentation

## 2022-10-21 DIAGNOSIS — M25512 Pain in left shoulder: Secondary | ICD-10-CM | POA: Diagnosis not present

## 2022-10-21 DIAGNOSIS — M546 Pain in thoracic spine: Secondary | ICD-10-CM | POA: Diagnosis present

## 2022-10-21 DIAGNOSIS — J45909 Unspecified asthma, uncomplicated: Secondary | ICD-10-CM | POA: Insufficient documentation

## 2022-10-21 DIAGNOSIS — R1110 Vomiting, unspecified: Secondary | ICD-10-CM | POA: Diagnosis not present

## 2022-10-21 LAB — CBC WITH DIFFERENTIAL/PLATELET
Abs Immature Granulocytes: 0.02 10*3/uL (ref 0.00–0.07)
Basophils Absolute: 0 10*3/uL (ref 0.0–0.1)
Basophils Relative: 1 %
Eosinophils Absolute: 0.2 10*3/uL (ref 0.0–0.5)
Eosinophils Relative: 3 %
HCT: 34.1 % — ABNORMAL LOW (ref 36.0–46.0)
Hemoglobin: 11.4 g/dL — ABNORMAL LOW (ref 12.0–15.0)
Immature Granulocytes: 0 %
Lymphocytes Relative: 41 %
Lymphs Abs: 2.6 10*3/uL (ref 0.7–4.0)
MCH: 29.5 pg (ref 26.0–34.0)
MCHC: 33.4 g/dL (ref 30.0–36.0)
MCV: 88.3 fL (ref 80.0–100.0)
Monocytes Absolute: 0.3 10*3/uL (ref 0.1–1.0)
Monocytes Relative: 5 %
Neutro Abs: 3.1 10*3/uL (ref 1.7–7.7)
Neutrophils Relative %: 50 %
Platelets: 253 10*3/uL (ref 150–400)
RBC: 3.86 MIL/uL — ABNORMAL LOW (ref 3.87–5.11)
RDW: 13.2 % (ref 11.5–15.5)
WBC: 6.2 10*3/uL (ref 4.0–10.5)
nRBC: 0 % (ref 0.0–0.2)

## 2022-10-21 MED ORDER — NAPROXEN 250 MG PO TABS
500.0000 mg | ORAL_TABLET | Freq: Once | ORAL | Status: AC
  Administered 2022-10-22: 500 mg via ORAL
  Filled 2022-10-21: qty 2

## 2022-10-21 MED ORDER — METOCLOPRAMIDE HCL 10 MG PO TABS
10.0000 mg | ORAL_TABLET | Freq: Once | ORAL | Status: AC
  Administered 2022-10-22: 10 mg via ORAL
  Filled 2022-10-21: qty 1
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
## 2022-10-21 NOTE — ED Triage Notes (Signed)
Pt BIB [facility] from home c/o left arm pain and shoulder pain x4 days. Pt also endorses a sore throat and having pain when she takes a deep breathe behind her shoulder blades.

## 2022-10-22 DIAGNOSIS — M546 Pain in thoracic spine: Secondary | ICD-10-CM | POA: Diagnosis not present

## 2022-10-22 LAB — BASIC METABOLIC PANEL
Anion gap: 8 (ref 5–15)
BUN: 12 mg/dL (ref 6–20)
CO2: 24 mmol/L (ref 22–32)
Calcium: 8.8 mg/dL — ABNORMAL LOW (ref 8.9–10.3)
Chloride: 106 mmol/L (ref 98–111)
Creatinine, Ser: 0.94 mg/dL (ref 0.44–1.00)
GFR, Estimated: >60 mL/min (ref >60)
Glucose, Bld: 104 mg/dL — ABNORMAL HIGH (ref 70–99)
Potassium: 3.9 mmol/L (ref 3.5–5.1)
Sodium: 138 mmol/L (ref 135–145)

## 2022-10-22 MED ORDER — ALBUTEROL SULFATE HFA 108 (90 BASE) MCG/ACT IN AERS
2.0000 | INHALATION_SPRAY | Freq: Once | RESPIRATORY_TRACT | Status: AC
  Administered 2022-10-22: 2 via RESPIRATORY_TRACT
  Filled 2022-10-22: qty 6.7

## 2022-10-22 MED ORDER — BUDESONIDE-FORMOTEROL FUMARATE 160-4.5 MCG/ACT IN AERO: 2.0000 | INHALATION_SPRAY | Freq: Every day | RESPIRATORY_TRACT | 12 refills | Status: DC

## 2022-10-22 MED ORDER — ACETAMINOPHEN 500 MG PO TABS
1000.0000 mg | ORAL_TABLET | Freq: Once | ORAL | Status: AC
  Administered 2022-10-22: 1000 mg via ORAL
  Filled 2022-10-22: qty 2

## 2022-10-22 MED ORDER — DEXAMETHASONE 4 MG PO TABS
10.0000 mg | ORAL_TABLET | Freq: Once | ORAL | Status: AC
  Administered 2022-10-22: 10 mg via ORAL
  Filled 2022-10-22: qty 3

## 2022-10-22 NOTE — ED Provider Notes (Signed)
[facility] Provider Note   CSN: [id number] Arrival date & time: 10/21/22  2244     History  Chief Complaint  Patient presents with   Shoulder Pain    [name] is a 52 y.o. female.  52 yo F who presents to the ED today for rib cage pain and thoracic paraspinal pain for the last couple days after having what sounds like a viral illness including emesis. States that she also has asthma and coughs some, wonders if these could be causing it. No fevers. No diarrhea. No abdominal pain or leg edema. No chest pain. Was told that maybe we could give her some tylenol.    Shoulder Pain      Home Medications Prior to Admission medications   Medication Sig Start Date End Date Taking? Authorizing Provider  budesonide-formoterol (SYMBICORT) 160-4.5 MCG/ACT inhaler Inhale 2 puffs into the lungs daily. 10/22/22  Yes Timeka Goette, Corene Cornea, MD  albuterol (VENTOLIN HFA) 108 (90 Base) MCG/ACT inhaler Inhale 2 puffs into the lungs 4 (four) times daily as needed for wheezing or shortness of breath.    [provider]  ascorbic acid (VITAMIN C) 500 MG tablet Take 1 tablet (500 mg total) by mouth 2 (two) times daily. 05/10/22   Dian Situ, MD  divalproex (DEPAKOTE ER) 500 MG 24 hr tablet Take 2 tablets (1,000 mg total) by mouth at bedtime. 05/10/22   Dian Situ, MD  metFORMIN (GLUCOPHAGE) 500 MG tablet Take 1 tablet (500 mg total) by mouth daily with breakfast. 09/15/22 10/15/22  Tedd Sias, PA  ondansetron (ZOFRAN-ODT) 4 MG disintegrating tablet Take 1 tablet (4 mg total) by mouth every 8 (eight) hours as needed for nausea or vomiting. 09/15/22   Tedd Sias, PA  paliperidone (INVEGA SUSTENNA) 234 MG/1.5ML injection Inject 234 mg into the muscle every 28 (twenty-eight) days. 06/02/22   Dian Situ, MD  traZODone (DESYREL) 50 MG tablet Take 1 tablet (50 mg total) by mouth at bedtime as needed for sleep. 09/15/22   Tedd Sias, PA  ferrous  sulfate 325 (65 FE) MG tablet Take 1 tablet (325 mg total) by mouth daily with breakfast. Patient not taking: No sig reported 12/24/18 04/20/19  [name], MD      Allergies    Milk-related compounds, Pollen extract, Pork-derived products, Haldol [haloperidol decanoate], Penicillins, and Shrimp [shellfish allergy]    Review of Systems   Review of Systems  Physical Exam Updated Vital Signs BP (!) 131/96 (BP Location: Right Arm)   Pulse 73   Temp 97.8 F (36.6 C) (Oral)   Resp 18   Ht '5\' 8"'$  (1.727 m)   Wt 106.6 kg   SpO2 99%   BMI 35.73 kg/m  Physical Exam Vitals and nursing note reviewed.  Constitutional:      Appearance: She is well-developed.  HENT:     Head: Normocephalic and atraumatic.     Mouth/Throat:     Mouth: Mucous membranes are moist.     Pharynx: Oropharynx is clear.  Eyes:     Pupils: Pupils are equal, round, and reactive to light.  Cardiovascular:     Rate and Rhythm: Normal rate and regular rhythm.  Pulmonary:     Effort: No respiratory distress.     Breath sounds: No stridor.  Abdominal:     General: There is no distension.  Musculoskeletal:        General: Tenderness (bilateral ribs without crepitus, stepoffs or deformities) present. No  swelling. Normal range of motion.     Cervical back: Normal range of motion.  Skin:    General: Skin is warm and dry.  Neurological:     General: No focal deficit present.     Mental Status: She is alert.     ED Results / Procedures / Treatments   Labs (all labs ordered are listed, but only abnormal results are displayed) Labs Reviewed  CBC WITH DIFFERENTIAL/PLATELET - Abnormal; Notable for the following components:      Result Value   RBC 3.86 (*)    Hemoglobin 11.4 (*)    HCT 34.1 (*)    All other components within normal limits  BASIC METABOLIC PANEL - Abnormal; Notable for the following components:   Glucose, Bld 104 (*)    Calcium 8.8 (*)    All other components within normal limits     EKG None  Radiology DG Chest 2 View  Result Date: 10/21/2022 CLINICAL DATA:  Chest pain. EXAM: CHEST - 2 VIEW COMPARISON:  PA Lat 07/12/2021. FINDINGS: The heart size and mediastinal contours are within normal limits. Both lungs are clear. The visualized skeletal structures are unremarkable apart from thoracic spondylosis. IMPRESSION: No evidence of acute chest disease. Electronically Signed   By: Telford Nab M.D.   On: 10/21/2022 23:26    Procedures Procedures    Medications Ordered in ED Medications  naproxen (NAPROSYN) tablet 500 mg (has no administration in time range)  metoCLOPramide (REGLAN) tablet 10 mg (has no administration in time range)  acetaminophen (TYLENOL) tablet 1,000 mg (has no administration in time range)  albuterol (VENTOLIN HFA) 108 (90 Base) MCG/ACT inhaler 2 puff (has no administration in time range)  dexamethasone (DECADRON) tablet 10 mg (has no administration in time range)    ED Course/ Medical Decision Making/ A&P                             Medical Decision Making Risk OTC drugs. Prescription drug management.   CXR and basic labs reassuring. No indication for cardiac workup as I suspect it is from muscular causes related to recent emesis. No nauseous now, tolerating PO prior to arrival and here. Will provide tylenol and albuterol  per patient request. Symbicort Rx.   Final Clinical Impression(s) / ED Diagnoses Final diagnoses:  Bilateral thoracic back pain, unspecified chronicity    Rx / DC Orders ED Discharge Orders          Ordered    budesonide-formoterol (SYMBICORT) 160-4.5 MCG/ACT inhaler  Daily        10/22/22 0057              Jamarques Pinedo, Corene Cornea, MD 10/22/22 0155

## 2022-11-16 ENCOUNTER — Ambulatory Visit: Payer: 59 | Admitting: Family Medicine

## 2022-11-30 ENCOUNTER — Emergency Department (HOSPITAL_COMMUNITY)
Admission: EM | Admit: 2022-11-30 | Discharge: 2022-12-01 | Disposition: A | Discharge status: Home / Self Care | Payer: Medicare PPO | Attending: Emergency Medicine | Admitting: Emergency Medicine

## 2022-11-30 ENCOUNTER — Other Ambulatory Visit: Payer: Self-pay

## 2022-11-30 DIAGNOSIS — U071 COVID-19: Secondary | ICD-10-CM

## 2022-11-30 DIAGNOSIS — F250 Schizoaffective disorder, bipolar type: Secondary | ICD-10-CM | POA: Diagnosis present

## 2022-11-30 DIAGNOSIS — R45851 Suicidal ideations: Secondary | ICD-10-CM | POA: Diagnosis not present

## 2022-11-30 NOTE — ED Notes (Signed)
Pt changed into scrubs, belongings removed, wanded by security. Pt unable to provide urine sample at this time

## 2022-11-30 NOTE — ED Triage Notes (Signed)
Pt is ambulatory to triage " I am just suicidal, I am going to kill myself, I really don't know when, but I just thought I would go ahead and come to the hospital. I am not on an assisted suicide list yet, I would just do it myself. I don't have the money right now, but if I did I sure would" Pt denies a specific plan.

## 2022-11-30 NOTE — ED Provider Notes (Incomplete)
[facility] Provider Note   CSN: [id number] Arrival date & time: 11/30/22  2316     History {Add pertinent medical, surgical, social history, OB history to HPI:1} Chief Complaint  Patient presents with  . Suicidal    [name] is a 52 y.o. female.  Patient with history of schizophrenia --presents to the emergency department after calling the police tonight for evaluation of suicidal ideations.  Patient has very disorganized speech.  She states that she is planning on killing herself.  She does not know when she is going to do this but states "I wanted to tell somebody first".  When asked review of systems questions regarding recent medical illness she reports "I do not know".        Home Medications Prior to Admission medications   Medication Sig Start Date End Date Taking? Authorizing Provider  albuterol (VENTOLIN HFA) 108 (90 Base) MCG/ACT inhaler Inhale 2 puffs into the lungs 4 (four) times daily as needed for wheezing or shortness of breath.    [provider]  ascorbic acid (VITAMIN C) 500 MG tablet Take 1 tablet (500 mg total) by mouth 2 (two) times daily. 05/10/22   Dian Situ, MD  budesonide-formoterol (SYMBICORT) 160-4.5 MCG/ACT inhaler Inhale 2 puffs into the lungs daily. 10/22/22   Mesner, Corene Cornea, MD  divalproex (DEPAKOTE ER) 500 MG 24 hr tablet Take 2 tablets (1,000 mg total) by mouth at bedtime. 05/10/22   Dian Situ, MD  metFORMIN (GLUCOPHAGE) 500 MG tablet Take 1 tablet (500 mg total) by mouth daily with breakfast. 09/15/22 10/15/22  Tedd Sias, PA  ondansetron (ZOFRAN-ODT) 4 MG disintegrating tablet Take 1 tablet (4 mg total) by mouth every 8 (eight) hours as needed for nausea or vomiting. 09/15/22   Tedd Sias, PA  paliperidone (INVEGA SUSTENNA) 234 MG/1.5ML injection Inject 234 mg into the muscle every 28 (twenty-eight) days. 06/02/22   Dian Situ, MD  traZODone (DESYREL) 50 MG tablet Take 1  tablet (50 mg total) by mouth at bedtime as needed for sleep. 09/15/22   Tedd Sias, PA  ferrous sulfate 325 (65 FE) MG tablet Take 1 tablet (325 mg total) by mouth daily with breakfast. Patient not taking: No sig reported 12/24/18 04/20/19  [name], MD      Allergies    Milk-related compounds, Pollen extract, Pork-derived products, Haldol [haloperidol decanoate], Penicillins, and Shrimp [shellfish allergy]    Review of Systems   Review of Systems  Physical Exam Updated Vital Signs BP (!) 151/101 (BP Location: Right Arm)   Pulse 81   Temp 98.1 F (36.7 C)   Resp 18   SpO2 100%  Physical Exam Vitals and nursing note reviewed.  Constitutional:      Appearance: She is well-developed.  HENT:     Head: Normocephalic and atraumatic.  Eyes:     Conjunctiva/sclera: Conjunctivae normal.  Pulmonary:     Effort: No respiratory distress.  Musculoskeletal:     Cervical back: Normal range of motion and neck supple.  Skin:    General: Skin is warm and dry.  Neurological:     Mental Status: She is alert.  Psychiatric:        Attention and Perception: Attention normal.        Mood and Affect: Mood is depressed. Affect is blunt.        Speech: Speech is rapid and pressured and tangential.        Behavior: Behavior is  hyperactive.        Thought Content: Thought content includes suicidal ideation. Thought content does not include suicidal plan.     ED Results / Procedures / Treatments   Labs (all labs ordered are listed, but only abnormal results are displayed) Labs Reviewed  COMPREHENSIVE METABOLIC PANEL  ETHANOL  RAPID URINE DRUG SCREEN, HOSP PERFORMED  CBC WITH DIFFERENTIAL/PLATELET  VALPROIC ACID LEVEL  I-STAT BETA HCG BLOOD, ED ([facility], [facility], [facility] ONLY)    EKG None  Radiology No results found.  Procedures Procedures  {Document cardiac monitor, telemetry assessment procedure when appropriate:1}  Medications Ordered in ED Medications - No data to display  ED  Course/ Medical Decision Making/ A&P    Patient seen and examined. History obtained directly from patient.   Labs/EKG: Ordered medical screening including CBC, CMP, UDS, ethanol.  She has been on Depakote in the past, level ordered.  Imaging: None ordered  Medications/Fluids: None ordered  Most recent vital signs reviewed and are as follows: BP (!) 151/101 (BP Location: Right Arm)   Pulse 81   Temp 98.1 F (36.7 C)   Resp 18   SpO2 100%   Initial impression: Suicidal ideation    {   Click here for ABCD2, HEART and other calculatorsREFRESH Note before signing :1}                          Medical Decision Making Amount and/or Complexity of Data Reviewed Labs: ordered.   ***  {Document critical care time when appropriate:1} {Document review of labs and clinical decision tools ie heart score, Chads2Vasc2 etc:1}  {Document your independent review of radiology images, and any outside records:1} {Document your discussion with family members, caretakers, and with consultants:1} {Document social determinants of health affecting pt's care:1} {Document your decision making why or why not admission, treatments were needed:1} Final Clinical Impression(s) / ED Diagnoses Final diagnoses:  None    Rx / DC Orders ED Discharge Orders     None

## 2022-11-30 NOTE — ED Provider Notes (Signed)
[place] [facility] Provider [facility]   CSN: [id number] Arrival date & time: 11/30/22  2316     History  Chief Complaint  Patient presents with   Suicidal    Yvette Benceonia Colin is a 52 y.o. female.  Patient with history of schizophrenia --presents to the emergency department after calling the police tonight for evaluation of suicidal ideations.  Patient has very disorganized speech.  She states that she is planning on killing herself.  She does not know when she is going to do this but states "I wanted to tell somebody first".  When asked review of systems questions regarding recent medical illness she reports "I do not know".       Home Medications Prior to Admission medications   Medication Sig Start Date End Date Taking? Authorizing Provider  albuterol (VENTOLIN HFA) 108 (90 Base) MCG/ACT inhaler Inhale 2 puffs into the lungs 4 (four) times daily as needed for wheezing or shortness of breath.    [provider]  ascorbic acid (VITAMIN C) 500 MG tablet Take 1 tablet (500 mg total) by mouth 2 (two) times daily. 05/10/22   Sarita BottomAttiah, Nadir, MD  budesonide-formoterol (SYMBICORT) 160-4.5 MCG/ACT inhaler Inhale 2 puffs into the lungs daily. 10/22/22   Mesner, Barbara CowerJason, MD  divalproex (DEPAKOTE ER) 500 MG 24 hr tablet Take 2 tablets (1,000 mg total) by mouth at bedtime. 05/10/22   Sarita BottomAttiah, Nadir, MD  metFORMIN (GLUCOPHAGE) 500 MG tablet Take 1 tablet (500 mg total) by mouth daily with breakfast. 09/15/22 10/15/22  Gailen ShelterFondaw, Wylder S, PA  ondansetron (ZOFRAN-ODT) 4 MG disintegrating tablet Take 1 tablet (4 mg total) by mouth every 8 (eight) hours as needed for nausea or vomiting. 09/15/22   Gailen ShelterFondaw, Wylder S, PA  paliperidone (INVEGA SUSTENNA) 234 MG/1.5ML injection Inject 234 mg into the muscle every 28 (twenty-eight) days. 06/02/22   Sarita BottomAttiah, Nadir, MD  traZODone (DESYREL) 50 MG tablet Take 1 tablet (50 mg total) by mouth at bedtime as needed for sleep. 09/15/22    Gailen ShelterFondaw, Wylder S, PA  ferrous sulfate 325 (65 FE) MG tablet Take 1 tablet (325 mg total) by mouth daily with breakfast. Patient not taking: No sig reported 12/24/18 04/20/19  Malvin JohnsFarah, Brian, MD      Allergies    Milk-related compounds, Pollen extract, Pork-derived products, Haldol [haloperidol decanoate], Penicillins, and Shrimp [shellfish allergy]    Review of Systems   Review of Systems  Physical Exam Updated Vital Signs BP (!) 151/101 (BP Location: Right Arm)   Pulse 81   Temp 98.1 F (36.7 C)   Resp 18   SpO2 100%  Physical Exam Vitals and nursing note reviewed.  Constitutional:      Appearance: She is well-developed.  HENT:     Head: Normocephalic and atraumatic.  Eyes:     Conjunctiva/sclera: Conjunctivae normal.  Pulmonary:     Effort: No respiratory distress.  Musculoskeletal:     Cervical back: Normal range of motion and neck supple.  Skin:    General: Skin is warm and dry.  Neurological:     Mental Status: She is alert.  Psychiatric:        Attention and Perception: Attention normal.        Mood and Affect: Mood is depressed. Affect is blunt.        Speech: Speech is rapid and pressured and tangential.        Behavior: Behavior is hyperactive.        Thought Content:  Thought content includes suicidal ideation. Thought content does not include suicidal plan.    ED Results / Procedures / Treatments   Labs (all labs ordered are listed, but only abnormal results are displayed) Labs Reviewed  COMPREHENSIVE METABOLIC PANEL  ETHANOL  RAPID URINE DRUG SCREEN, HOSP PERFORMED  CBC WITH DIFFERENTIAL/PLATELET  VALPROIC ACID LEVEL  I-STAT BETA HCG BLOOD, ED ([facility], [facility], [facility] ONLY)    EKG None  Radiology No results found.  Procedures Procedures    Medications Ordered in ED Medications - No data to display  ED Course/ Medical Decision Making/ A&P    Patient seen and examined. History obtained directly from patient.   Labs/EKG: Ordered medical screening  including CBC, CMP, UDS, ethanol.  She has been on Depakote in the past, level ordered.  Imaging: None ordered  Medications/Fluids: None ordered  Most recent vital signs reviewed and are as follows: BP (!) 151/101 (BP Location: Right Arm)   Pulse 81   Temp 98.1 F (36.7 C)   Resp 18   SpO2 100%   Initial impression: Suicidal ideation  1:29 AM   Labs personally reviewed and interpreted including: CBC with mild anemia with hemoglobin 11.5 otherwise unremarkable; CMP mild hypokalemia 3.3 otherwise unremarkable; negative pregnancy; ethanol negative; undetectable Depakote level.  COVID test incidentally positive, patient without significant active symptoms.  Most current vital signs reviewed and are as follows: BP (!) 151/101 (BP Location: Right Arm)   Pulse 81   Temp 98.1 F (36.7 C)   Resp 18   SpO2 100%   Plan: Patient medically cleared, COVID positive without significant sequelae, awaiting TTS evaluation.                             Medical Decision Making Amount and/or Complexity of Data Reviewed Labs: ordered.   Patient voicing SI, h/o schizophrenia. She is COVID positive without significant respiratory sx, unclear onset.  Do not feel she is a candidate for antiviral therapy at this time. Otherwise labs reassuring and she is medically cleared.          Final Clinical Impression(s) / ED Diagnoses Final diagnoses:  Suicidal ideation  Lab test positive for detection of COVID-19 virus    Rx / DC Orders ED Discharge Orders     None         Renne Crigler, PA-C 12/01/22 0238    Sloan Leiter, DO 12/01/22 [phone]

## 2022-12-01 DIAGNOSIS — R45851 Suicidal ideations: Secondary | ICD-10-CM | POA: Diagnosis not present

## 2022-12-01 DIAGNOSIS — F250 Schizoaffective disorder, bipolar type: Secondary | ICD-10-CM | POA: Diagnosis not present

## 2022-12-01 LAB — CBC WITH DIFFERENTIAL/PLATELET
Abs Immature Granulocytes: 0.02 10*3/uL (ref 0.00–0.07)
Basophils Absolute: 0 10*3/uL (ref 0.0–0.1)
Basophils Relative: 1 %
Eosinophils Absolute: 0.3 10*3/uL (ref 0.0–0.5)
Eosinophils Relative: 4 %
HCT: 35.9 % — ABNORMAL LOW (ref 36.0–46.0)
Hemoglobin: 11.5 g/dL — ABNORMAL LOW (ref 12.0–15.0)
Immature Granulocytes: 0 %
Lymphocytes Relative: 42 %
Lymphs Abs: 2.7 10*3/uL (ref 0.7–4.0)
MCH: 28.3 pg (ref 26.0–34.0)
MCHC: 32 g/dL (ref 30.0–36.0)
MCV: 88.4 fL (ref 80.0–100.0)
Monocytes Absolute: 0.3 10*3/uL (ref 0.1–1.0)
Monocytes Relative: 5 %
Neutro Abs: 3.1 10*3/uL (ref 1.7–7.7)
Neutrophils Relative %: 48 %
Platelets: 241 10*3/uL (ref 150–400)
RBC: 4.06 MIL/uL (ref 3.87–5.11)
RDW: 13 % (ref 11.5–15.5)
WBC: 6.4 10*3/uL (ref 4.0–10.5)
nRBC: 0 % (ref 0.0–0.2)

## 2022-12-01 LAB — VALPROIC ACID LEVEL: Valproic Acid Lvl: <10 ug/mL — ABNORMAL LOW (ref 50.0–100.0)

## 2022-12-01 LAB — COMPREHENSIVE METABOLIC PANEL
ALT: 12 U/L (ref 0–44)
AST: 12 U/L — ABNORMAL LOW (ref 15–41)
Albumin: 4.2 g/dL (ref 3.5–5.0)
Alkaline Phosphatase: 63 U/L (ref 38–126)
Anion gap: 9 (ref 5–15)
BUN: 12 mg/dL (ref 6–20)
CO2: 25 mmol/L (ref 22–32)
Calcium: 9.3 mg/dL (ref 8.9–10.3)
Chloride: 106 mmol/L (ref 98–111)
Creatinine, Ser: 0.88 mg/dL (ref 0.44–1.00)
GFR, Estimated: >60 mL/min (ref >60)
Glucose, Bld: 108 mg/dL — ABNORMAL HIGH (ref 70–99)
Potassium: 3.3 mmol/L — ABNORMAL LOW (ref 3.5–5.1)
Sodium: 140 mmol/L (ref 135–145)
Total Bilirubin: 0.5 mg/dL (ref 0.3–1.2)
Total Protein: 7.7 g/dL (ref 6.5–8.1)

## 2022-12-01 LAB — I-STAT BETA HCG BLOOD, ED (MC, WL, AP ONLY): I-stat hCG, quantitative: <5 m[IU]/mL (ref <5)

## 2022-12-01 LAB — SARS CORONAVIRUS 2 BY RT PCR: SARS Coronavirus 2 by RT PCR: POSITIVE — AB

## 2022-12-01 LAB — ETHANOL: Alcohol, Ethyl (B): <10 mg/dL (ref <10)

## 2022-12-01 MED ORDER — PAXLOVID (300/100) 20 X 150 MG & 10 X 100MG PO TBPK: 3.0000 | ORAL_TABLET | Freq: Two times a day (BID) | ORAL | 0 refills | Status: AC
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
## 2022-12-01 NOTE — ED Notes (Signed)
TTS in progress 

## 2022-12-01 NOTE — ED Notes (Signed)
TTS recommendation is re-eval by day shift and notify ACT team / [facility].

## 2022-12-01 NOTE — BH Assessment (Addendum)
Clinician spoke to RN Alfonzo Feller.  Pt is sleeping at this time.  TTS will try to see her when she is alert.

## 2022-12-01 NOTE — Discharge Instructions (Addendum)
Your COVID test is positive today.  You are being put on antiviral medicine called Paxlovid to help treat this.  Otherwise follow-up with your primary care physician if you have any new or worsening symptoms.  Follow-up with your psychiatrist for your mental health illness, however if you develop an acute crisis you can return to the ER or go to the behavioral health urgent care.
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
## 2022-12-01 NOTE — ED Provider Notes (Signed)
Patient was seen and cleared by psychiatry.  She is denying any SI today.  She was found to have COVID and thinks she might have developed some mild symptoms within the last 3 days and would like to start Paxlovid.  Otherwise she is well-appearing.  Will discharge home with a prescription and to follow-up with her psychiatrist and PCP.   Pricilla Loveless, MD 12/01/22 [phone]

## 2022-12-01 NOTE — ED Notes (Signed)
Urine specimen requested pt states unable to void at this time.

## 2022-12-17 IMAGING — US US ABDOMEN COMPLETE
1 series · 14 of 25 positions shown · non-contrast
Comparison: None

CLINICAL DATA: Abdominal bloating and distension, low back pain,
incontinence, [REDACTED] to red urine color for several weeks

EXAM:
ABDOMEN ULTRASOUND COMPLETE

[Series 1: us abdomen complete · 0.26mm/px · 14 of 98 slices shown]
[im 1/98]
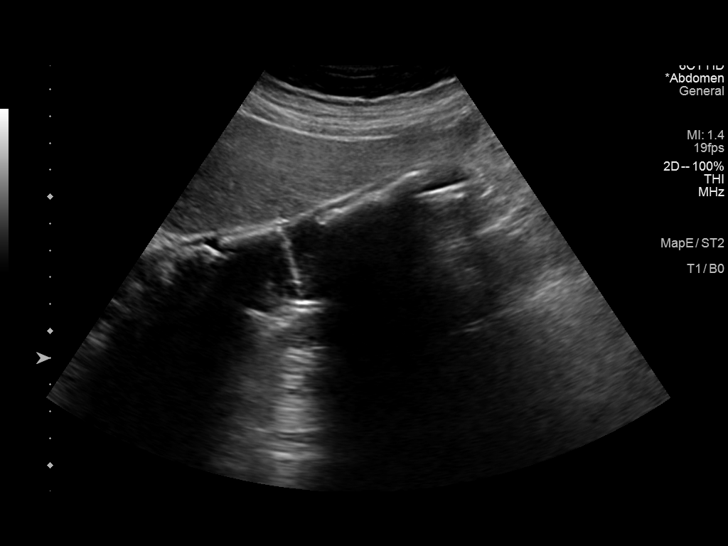
[im 9/98]
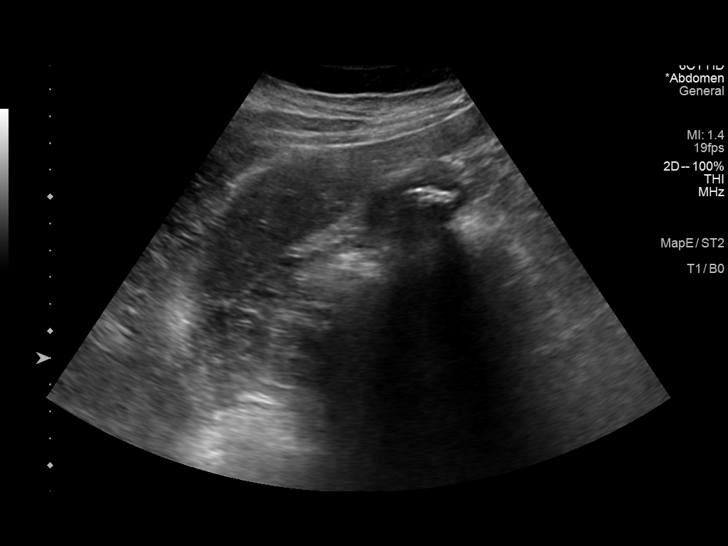
[im 17/98]
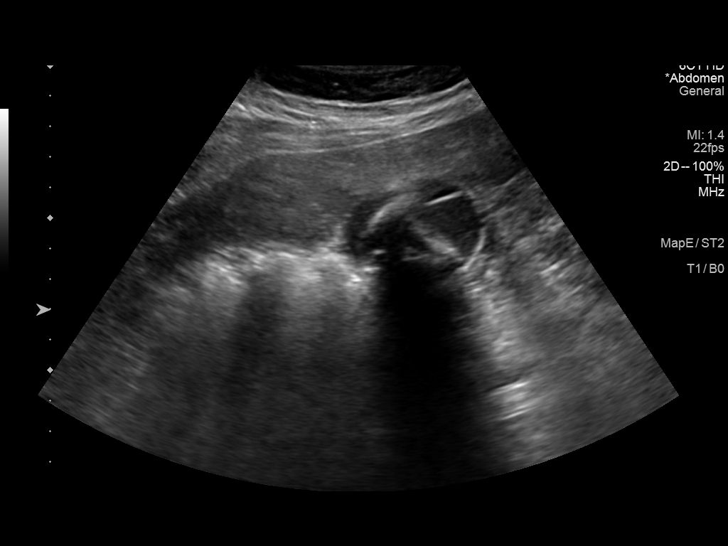
[im 25/98]
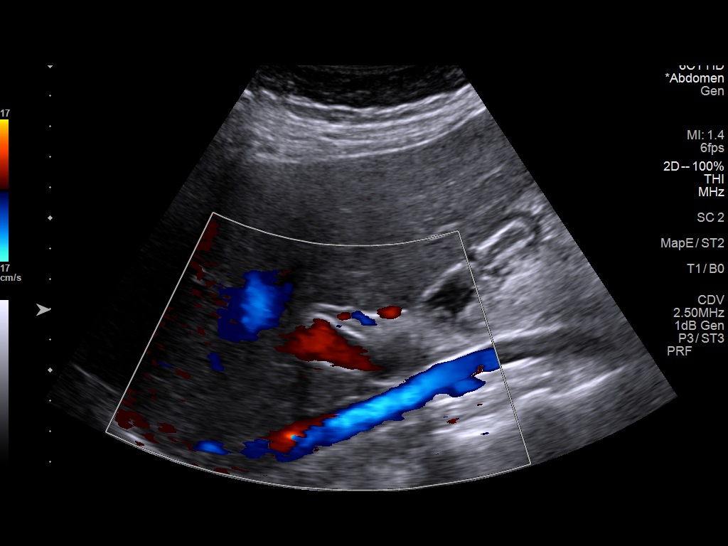
[im 33/98]
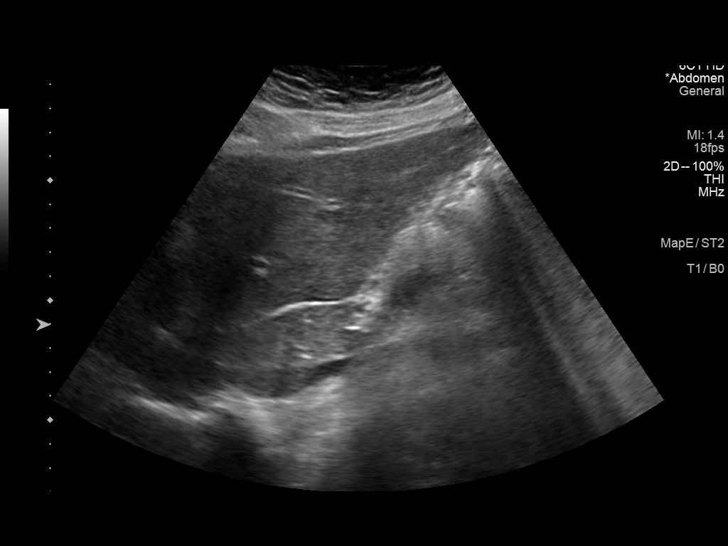
[im 37/98]
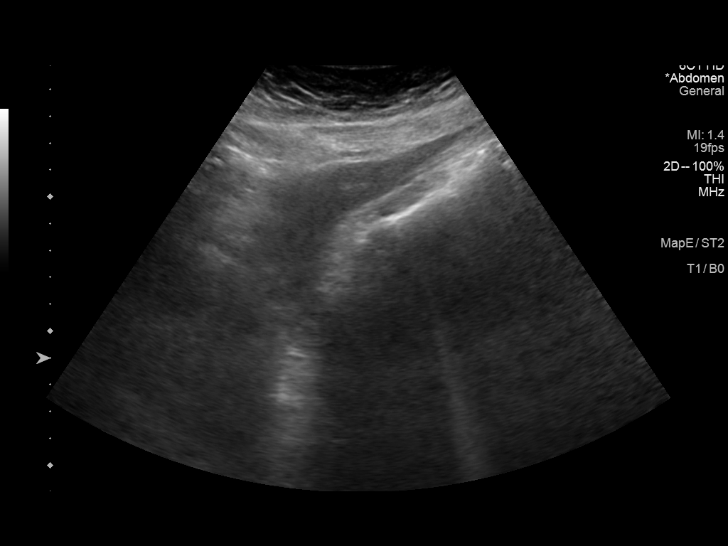
[im 45/98]
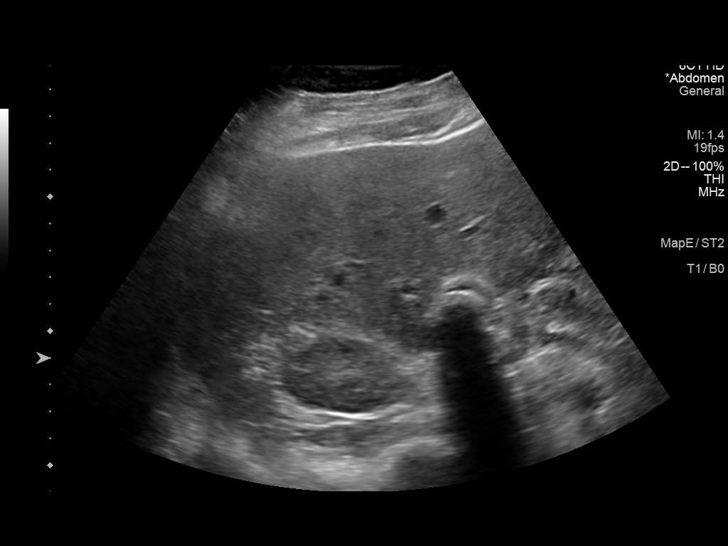
[im 53/98]
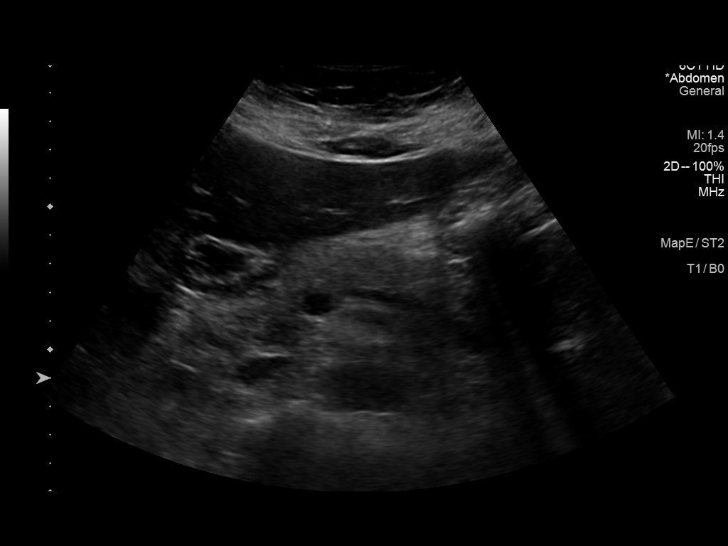
[im 61/98]
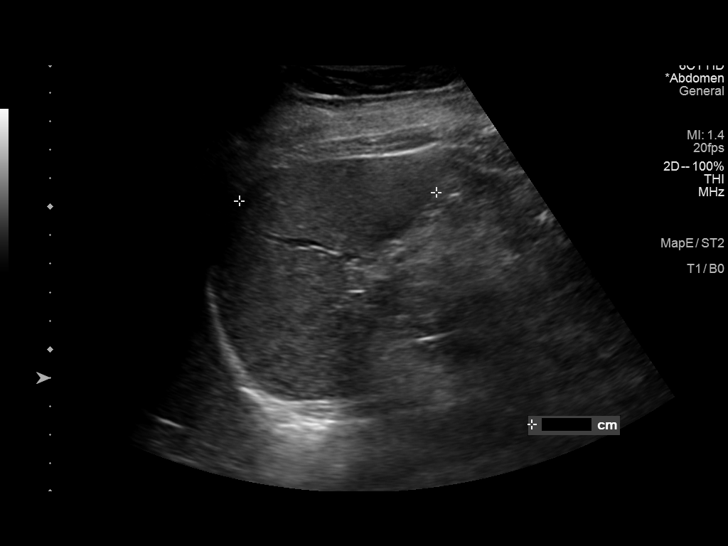
[im 65/98]
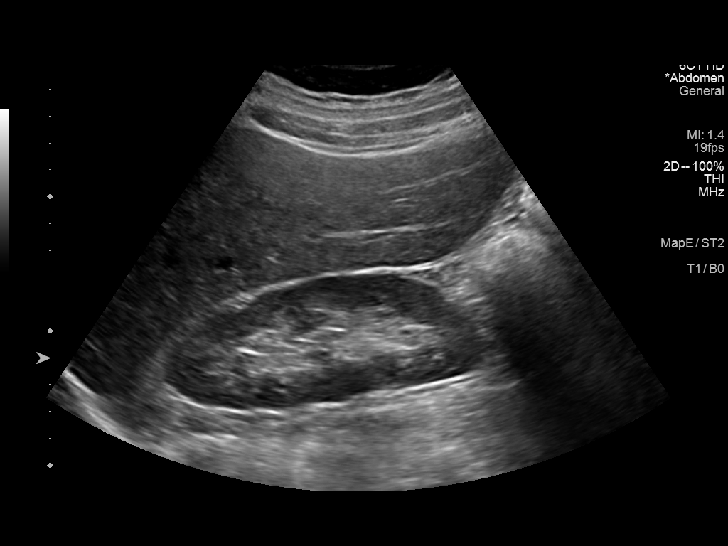
[im 73/98]
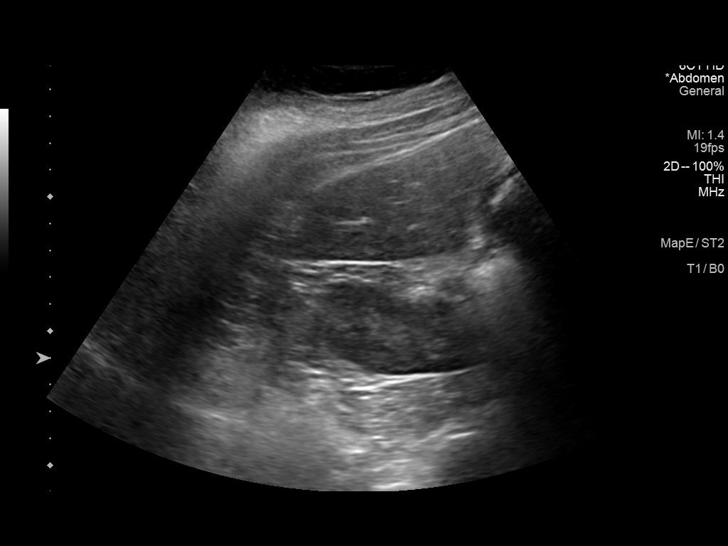
[im 81/98]
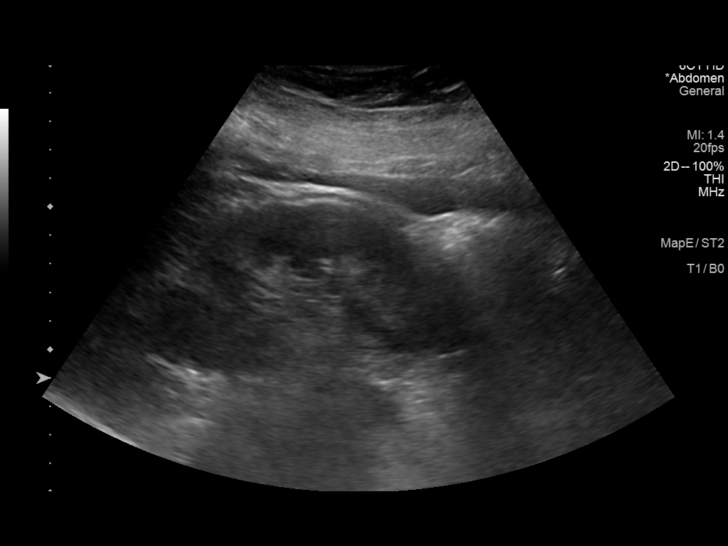
[im 89/98]
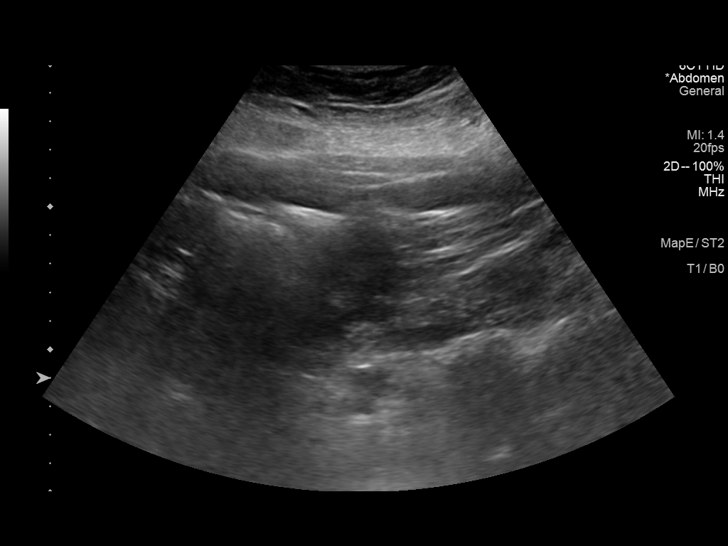
[im 98/98]
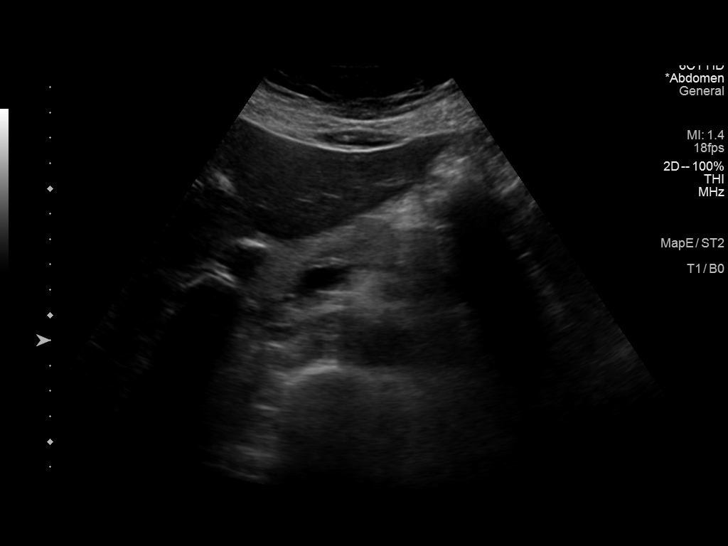

[14 of 25 positions shown; findings below may reference images not displayed]

FINDINGS: Gallbladder: Multiple shadowing calculi filled gallbladder, stones
measuring up to 3.2 cm diameter. No gallbladder wall thickening,
pericholecystic fluid or sonographic Murphy sign.

Common bile duct: Diameter: 2 mm, normal

Liver: Normal echogenicity without mass or nodularity. Portal vein
is patent on color Doppler imaging with normal direction of blood
flow towards the liver.

IVC: Normal appearance

Pancreas: Distal tail obscured by bowel gas. Visualized portion
normal appearance

Spleen: Normal appearance, 6.9 cm length

Right Kidney: Length: 12.3 cm. Normal morphology without mass or
hydronephrosis.

Left Kidney: Length: 11.5 cm. Normal morphology without mass or
hydronephrosis.

Abdominal aorta: Normal caliber

Other findings: No free fluid
IMPRESSION: Cholelithiasis without evidence acute cholecystitis or biliary
dilatation.

Remainder of exam unremarkable.

## 2023-01-17 ENCOUNTER — Other Ambulatory Visit: Payer: Self-pay

## 2023-01-17 ENCOUNTER — Encounter (HOSPITAL_COMMUNITY): Payer: Self-pay

## 2023-01-17 ENCOUNTER — Emergency Department (HOSPITAL_COMMUNITY)
Admission: EM | Admit: 2023-01-17 | Discharge: 2023-01-17 | Disposition: A | Discharge status: Home / Self Care | Payer: Medicare PPO | Attending: Emergency Medicine | Admitting: Emergency Medicine

## 2023-01-17 DIAGNOSIS — R439 Unspecified disturbances of smell and taste: Secondary | ICD-10-CM | POA: Diagnosis present

## 2023-01-17 DIAGNOSIS — U099 Post covid-19 condition, unspecified: Secondary | ICD-10-CM | POA: Diagnosis not present

## 2023-01-17 DIAGNOSIS — I10 Essential (primary) hypertension: Secondary | ICD-10-CM | POA: Insufficient documentation

## 2023-01-17 DIAGNOSIS — J339 Nasal polyp, unspecified: Secondary | ICD-10-CM | POA: Diagnosis not present

## 2023-01-17 DIAGNOSIS — R430 Anosmia: Secondary | ICD-10-CM

## 2023-01-17 MED ORDER — FLUTICASONE PROPIONATE 50 MCG/ACT NA SUSP: 1.0000 | Freq: Every day | NASAL | 2 refills | Status: DC

## 2023-01-17 NOTE — ED Triage Notes (Signed)
Pt came in via POV d/t after having Covid 2-3 months ago she has been smelling a "strong smell" for the last "two in a half weeks." Pt states it smells like "someone is smoking weed or HIV." Has chronic pain & rates it 5/10, also reports she may need more of her asthma medications while she is here. No distress noted while in triage.

## 2023-01-17 NOTE — Discharge Instructions (Addendum)
As we discussed, I suspect that your symptoms are due to changes in your sense of smell which are common after COVID.  You also have nasal polyps in both of your nostrils which could be contributing to your symptoms.  I have given you a prescription for Flonase which is a nasal spray for you to place in each nare as prescribed every day to help with this.  Have also given you a referral to ENT with a number to call to schedule an appointment for follow-up management of this.  Return if development of any new or worsening symptoms.

## 2023-01-17 NOTE — ED Provider Notes (Signed)
[name] EMERG[facility]Y DE[facility]MENT AT [facility] Provider Note   CSN: [id number] Arrival date & time: 01/17/23  1403     History  Chief Complaint  Patient presents with   Can smell something funny    [name] is a 52 y.o. female.  Patient with history of schizophrenia, seizures, hypertension presents today with complaints of smelling a foul odor. She states that she was diagnosed with COVID in 11/29/21 and her symptoms have been persistent since then. She states that sometimes she feels as though she can't smell anything and then at other times she says she smells a foul odor. She denies any sinus congestion or nasal drainage. No fevers or chills. No headaches, vision changes, weakness, or numbness/tingling in her extremities. No nausea or vomiting. No history of similar symptoms previously. She is complaint with her Depakote. Denies SI/HI or AVH.  The history is provided by the patient. No language interpreter was used.       Home Medications Prior to Admission medications   Medication Sig Start Date End Date Taking? Authorizing Provider  albuterol (VENTOLIN HFA) 108 (90 Base) MCG/ACT inhaler Inhale 2 puffs into the lungs 4 (four) times daily as needed for wheezing or shortness of breath.    [provider]  ascorbic acid (VITAMIN C) 500 MG tablet Take 1 tablet (500 mg total) by mouth 2 (two) times daily. 05/10/22   Sarita Bottom, MD  budesonide-formoterol (SYMBICORT) 160-4.5 MCG/ACT inhaler Inhale 2 puffs into the lungs daily. 10/22/22   Mesner, Barbara Cower, MD  divalproex (DEPAKOTE ER) 500 MG 24 hr tablet Take 2 tablets (1,000 mg total) by mouth at bedtime. 05/10/22   Sarita Bottom, MD  metFORMIN (GLUCOPHAGE) 500 MG tablet Take 1 tablet (500 mg total) by mouth daily with breakfast. 09/15/22 10/15/22  Gailen Shelter, PA  ondansetron (ZOFRAN-ODT) 4 MG disintegrating tablet Take 1 tablet (4 mg total) by mouth every 8 (eight) hours as needed for nausea or vomiting. 09/15/22    Gailen Shelter, PA  paliperidone (INVEGA SUSTENNA) 234 MG/1.5ML injection Inject 234 mg into the muscle every 28 (twenty-eight) days. 06/02/22   Sarita Bottom, MD  traZODone (DESYREL) 50 MG tablet Take 1 tablet (50 mg total) by mouth at bedtime as needed for sleep. 09/15/22   Gailen Shelter, PA  ferrous sulfate 325 (65 FE) MG tablet Take 1 tablet (325 mg total) by mouth daily with breakfast. Patient not taking: No sig reported 12/24/18 04/20/19  Malvin Johns, MD      Allergies    Milk-related compounds, Pollen extract, Pork-derived products, Haldol [haloperidol decanoate], Penicillins, and Shrimp [shellfish allergy]    Review of Systems   Review of Systems  All other systems reviewed and are negative.   Physical Exam Updated Vital Signs BP 119/87   Pulse 88   Temp 98.1 F (36.7 C)   Resp 15   SpO2 99%  Physical Exam Vitals and nursing note reviewed.  Constitutional:      General: She is not in acute distress.    Appearance: Normal appearance. She is normal weight. She is not ill-appearing, toxic-appearing or diaphoretic.  HENT:     Head: Normocephalic and atraumatic.     Comments: No maxillary or frontal sinus tenderness    Right Ear: Tympanic membrane, ear canal and external ear normal.     Left Ear: Tympanic membrane, ear canal and external ear normal.     Nose:     Comments: Nasal polyps present in bilateral nares.  Mouth/Throat:     Mouth: Mucous membranes are moist.  Eyes:     Extraocular Movements: Extraocular movements intact.     Pupils: Pupils are equal, round, and reactive to light.  Cardiovascular:     Rate and Rhythm: Normal rate.  Pulmonary:     Effort: Pulmonary effort is normal. No respiratory distress.  Musculoskeletal:        General: Normal range of motion.     Cervical back: Normal range of motion and neck supple.  Skin:    General: Skin is warm and dry.  Neurological:     General: No focal deficit present.     Mental Status: She is alert and  oriented to person, place, and time.     GCS: GCS eye subscore is 4. GCS verbal subscore is 5. GCS motor subscore is 6.     Cranial Nerves: No cranial nerve deficit.     Sensory: Sensation is intact.     Motor: Motor function is intact. No weakness.     Coordination: Coordination is intact.     Gait: Gait is intact. Gait normal.     Comments: Alert and oriented to self, place, time and event.    Speech is fluent, clear without dysarthria or dysphasia.    Strength 5/5 in upper/lower extremities   Sensation intact in upper/lower extremities    CN I not tested  CN II grossly intact visual fields bilaterally. Did not visualize posterior eye.  CN III, IV, VI PERRLA and EOMs intact bilaterally  CN V Intact sensation to sharp and light touch to the face  CN VII facial movements symmetric  CN VIII not tested  CN IX, X no uvula deviation, symmetric rise of soft palate  CN XI 5/5 SCM and trapezius strength bilaterally  CN XII Midline tongue protrusion, symmetric L/R movements   Psychiatric:        Mood and Affect: Mood normal.        Behavior: Behavior normal.     ED Results / Procedures / Treatments   Labs (all labs ordered are listed, but only abnormal results are displayed) Labs Reviewed - No data to display  EKG None  Radiology No results found.  Procedures Procedures    Medications Ordered in ED Medications - No data to display  ED Course/ Medical Decision Making/ A&P                             Medical Decision Making  Patient presents today with complaints of smelling a foul odor.  Symptoms began when she was diagnosed with COVID over 1 month ago.  She is afebrile, nontoxic-appearing, and in no acute distress with reassuring vital signs.  Physical exam does show bilateral nasal polyps.  No signs of sinusitis or a chronic sinus infection.  She is alert and oriented and neurologically intact without focal deficits.  Very low suspicion for CVA or focal seizure. Suspect  patients symptoms are due to olfactory damage from COVID. She does also have bilateral nasal polyps which could be contributing to symptoms. Will give prescription for flonase for same as well as ENT referral for follow-up.  Discussed this with patient who is understanding and in agreement.  Did discuss further evaluation with labs and imaging which patient states she would prefer to defer additional workup at this time. Evaluation and diagnostic testing in the emergency department does not suggest an emergent condition requiring admission or immediate  intervention beyond what has been performed at this time.  Plan for discharge with close PCP follow-up.  Patient is understanding and amenable with plan, educated on red flag symptoms that would prompt immediate return.  Patient discharged in stable condition.   Final Clinical Impression(s) / ED Diagnoses Final diagnoses:  Post-COVID chronic loss of smell  Nasal polyps    Rx / DC Orders ED Discharge Orders          Ordered    fluticasone (FLONASE) 50 MCG/ACT nasal spray  Daily        01/17/23 1754          An After Visit Summary was printed and given to the patient.     Vear Clock 01/17/23 Donavan Burnet, MD 01/19/23 [phone]

## 2023-02-07 ENCOUNTER — Emergency Department (HOSPITAL_COMMUNITY)
Admission: EM | Admit: 2023-02-07 | Discharge: 2023-02-07 | Disposition: A | Discharge status: Home / Self Care | Payer: Medicare (Managed Care) | Attending: Emergency Medicine | Admitting: Emergency Medicine

## 2023-02-07 ENCOUNTER — Other Ambulatory Visit: Payer: Self-pay

## 2023-02-07 ENCOUNTER — Encounter (HOSPITAL_COMMUNITY): Payer: Self-pay | Admitting: Emergency Medicine

## 2023-02-07 DIAGNOSIS — T50905A Adverse effect of unspecified drugs, medicaments and biological substances, initial encounter: Secondary | ICD-10-CM | POA: Diagnosis not present

## 2023-02-07 DIAGNOSIS — T887XXA Unspecified adverse effect of drug or medicament, initial encounter: Secondary | ICD-10-CM | POA: Insufficient documentation

## 2023-02-07 MED ORDER — LORATADINE 10 MG PO TABS
10.0000 mg | ORAL_TABLET | Freq: Once | ORAL | Status: AC
  Administered 2023-02-07: 10 mg via ORAL
  Filled 2023-02-07: qty 1

## 2023-02-07 MED ORDER — FAMOTIDINE 20 MG PO TABS
20.0000 mg | ORAL_TABLET | Freq: Once | ORAL | Status: AC
  Administered 2023-02-07: 20 mg via ORAL
  Filled 2023-02-07: qty 1

## 2023-02-07 NOTE — ED Triage Notes (Signed)
  Patient BIB EMS for possible allergic reaction.  Patient states she was given her monthly injection for her anti-psychotic medication earlier today around 1500.  Patient states she smells medicine and feels drugged.  Patient ambulated to bed from EMS bay.  No signs of distress or allergic reaction.  No pain at this time.

## 2023-02-07 NOTE — ED Provider Notes (Signed)
[place] EMERGENCY DEPARTMENT AT [facility] Provider Note   CSN: [id number] Arrival date & time: 02/07/23  0310     History  Chief Complaint  Patient presents with   Allergic Reaction    [name] is a 52 y.o. female.   Allergic Reaction Presenting symptoms: no difficulty breathing, no difficulty swallowing, no itching, no rash, no swelling and no wheezing   Severity:  Mild Prior allergic episodes:  No prior episodes Context: medications   Context comment:  Invega given and now "smells medicine" Relieved by:  Nothing Worsened by:  Nothing Ineffective treatments:  None tried      Home Medications Prior to Admission medications   Medication Sig Start Date End Date Taking? Authorizing Provider  albuterol (VENTOLIN HFA) 108 (90 Base) MCG/ACT inhaler Inhale 2 puffs into the lungs 4 (four) times daily as needed for wheezing or shortness of breath.    [provider]  ascorbic acid (VITAMIN C) 500 MG tablet Take 1 tablet (500 mg total) by mouth 2 (two) times daily. 05/10/22   Sarita Bottom, MD  budesonide-formoterol (SYMBICORT) 160-4.5 MCG/ACT inhaler Inhale 2 puffs into the lungs daily. 10/22/22   Mesner, Barbara Cower, MD  divalproex (DEPAKOTE ER) 500 MG 24 hr tablet Take 2 tablets (1,000 mg total) by mouth at bedtime. 05/10/22   Sarita Bottom, MD  fluticasone (FLONASE) 50 MCG/ACT nasal spray Place 1 spray into both nostrils daily. 01/17/23   Smoot, Shawn Route, PA-C  metFORMIN (GLUCOPHAGE) 500 MG tablet Take 1 tablet (500 mg total) by mouth daily with breakfast. 09/15/22 10/15/22  Gailen Shelter, PA  ondansetron (ZOFRAN-ODT) 4 MG disintegrating tablet Take 1 tablet (4 mg total) by mouth every 8 (eight) hours as needed for nausea or vomiting. 09/15/22   Gailen Shelter, PA  paliperidone (INVEGA SUSTENNA) 234 MG/1.5ML injection Inject 234 mg into the muscle every 28 (twenty-eight) days. 06/02/22   Sarita Bottom, MD  traZODone (DESYREL) 50 MG tablet Take 1 tablet (50 mg  total) by mouth at bedtime as needed for sleep. 09/15/22   Gailen Shelter, PA  ferrous sulfate 325 (65 FE) MG tablet Take 1 tablet (325 mg total) by mouth daily with breakfast. Patient not taking: No sig reported 12/24/18 04/20/19  Malvin Johns, MD      Allergies    Milk-related compounds, Pollen extract, Pork-derived products, Haldol [haloperidol decanoate], Penicillins, and Shrimp [shellfish allergy]    Review of Systems   Review of Systems  HENT:  Negative for facial swelling and trouble swallowing.   Respiratory:  Negative for wheezing.   Gastrointestinal:  Negative for nausea and vomiting.  Skin:  Negative for itching and rash.  Neurological:  Negative for weakness.  Psychiatric/Behavioral:  Negative for suicidal ideas.   All other systems reviewed and are negative.   Physical Exam Updated Vital Signs BP 138/88 (BP Location: Right Arm)   Pulse 78   Temp 98 F (36.7 C) (Oral)   Resp 18   Ht 5\' 8"  (1.727 m)   Wt 106.6 kg   SpO2 99%   BMI 35.73 kg/m  Physical Exam Vitals and nursing note reviewed. Exam conducted with a chaperone present.  Constitutional:      General: She is not in acute distress.    Appearance: Normal appearance. She is well-developed.  HENT:     Head: Normocephalic and atraumatic.     Nose: Nose normal.     Mouth/Throat:     Mouth: Mucous membranes are moist.  Pharynx: Oropharynx is clear.  Eyes:     Pupils: Pupils are equal, round, and reactive to light.  Cardiovascular:     Rate and Rhythm: Normal rate and regular rhythm.     Pulses: Normal pulses.     Heart sounds: Normal heart sounds.  Pulmonary:     Effort: No respiratory distress.     Breath sounds: Normal breath sounds.  Abdominal:     General: Bowel sounds are normal. There is no distension.     Palpations: Abdomen is soft.     Tenderness: There is no abdominal tenderness. There is no guarding or rebound.  Genitourinary:    Vagina: No vaginal discharge.  Musculoskeletal:         General: Normal range of motion.     Cervical back: Normal range of motion and neck supple.  Skin:    General: Skin is warm and dry.     Capillary Refill: Capillary refill takes less than 2 seconds.     Findings: No erythema or rash.  Neurological:     General: No focal deficit present.     Mental Status: She is alert and oriented to person, place, and time.     Deep Tendon Reflexes: Reflexes normal.  Psychiatric:        Thought Content: Thought content normal.     ED Results / Procedures / Treatments   Labs (all labs ordered are listed, but only abnormal results are displayed) Labs Reviewed - No data to display  EKG None  Radiology No results found.  Procedures Procedures    Medications Ordered in ED Medications  loratadine (CLARITIN) tablet 10 mg (10 mg Oral Given 02/07/23 0324)  famotidine (PEPCID) tablet 20 mg (20 mg Oral Given 02/07/23 0324)    ED Course/ Medical Decision Making/ A&P                             Medical Decision Making Patient given Hinda Glatter and now smells medicine   Amount and/or Complexity of Data Reviewed Independent Historian: EMS    Details: See above  External Data Reviewed: notes.    Details: Previous notes reviewed   Risk OTC drugs. Risk Details: Not having an allergic reaction.  This could potentially be an adverse drug reaction.  Well appearing.  Neither labs or imaging are indicated, stable for discharge.      Final Clinical Impression(s) / ED Diagnoses Final diagnoses:  Adverse effect of drug, initial encounter  Return for intractable cough, coughing up blood, fevers > 100.4 unrelieved by medication, shortness of breath, intractable vomiting, chest pain, shortness of breath, weakness, numbness, changes in speech, facial asymmetry, abdominal pain, passing out, Inability to tolerate liquids or food, cough, altered mental status or any concerns. No signs of systemic illness or infection. The patient is nontoxic-appearing on exam and  vital signs are within normal limits.  I have reviewed the triage vital signs and the nursing notes. Pertinent labs & imaging results that were available during my care of the patient were reviewed by me and considered in my medical decision making (see chart for details). After history, exam, and medical workup I feel the patient has been appropriately medically screened and is safe for discharge home. Pertinent diagnoses were discussed with the patient. Patient was given return precautions  Rx / DC Orders ED Discharge Orders     None         Ren Aspinall, MD 02/07/23 [phone]

## 2023-02-19 ENCOUNTER — Emergency Department (HOSPITAL_COMMUNITY): Payer: Medicare (Managed Care)

## 2023-02-19 ENCOUNTER — Emergency Department (HOSPITAL_COMMUNITY)
Admission: EM | Admit: 2023-02-19 | Discharge: 2023-02-19 | Disposition: A | Discharge status: Home / Self Care | Payer: Medicare (Managed Care) | Attending: Emergency Medicine | Admitting: Emergency Medicine

## 2023-02-19 DIAGNOSIS — R0981 Nasal congestion: Secondary | ICD-10-CM | POA: Insufficient documentation

## 2023-02-19 DIAGNOSIS — M7532 Calcific tendinitis of left shoulder: Secondary | ICD-10-CM | POA: Insufficient documentation

## 2023-02-19 DIAGNOSIS — M25512 Pain in left shoulder: Secondary | ICD-10-CM | POA: Diagnosis present

## 2023-02-19 MED ORDER — IBUPROFEN 800 MG PO TABS: 800.0000 mg | ORAL_TABLET | Freq: Three times a day (TID) | ORAL | 0 refills | Status: DC | PRN

## 2023-02-19 NOTE — ED Triage Notes (Signed)
Pt BIBA w/ c/o left arm pain since yesterday morning. Denies chest pain, dizziness, SOB, N/V/D. No recent falls. Hx of psych. Pulled herself up with the left arm to get into the EMS truck. Pt states she has "been passing blood from vagina for 4-5 hours". No blood seen at this time. Vitals WDL. Hx HTN. Pt states "she may have COVID". No present sx at this time.

## 2023-02-19 NOTE — Discharge Instructions (Signed)
Follow-up with Dr. Ave Filter for your shoulder in the next few weeks

## 2023-02-19 NOTE — ED Notes (Signed)
Pt having disorganized thoughts. Pt hyperverbal and talking about things that are not relevant to situation or conversation.

## 2023-02-19 NOTE — ED Provider Notes (Signed)
[place] EMERGENCY DEPARTMENT AT [facility] Provider Note   CSN: [id number] Arrival date & time: 02/19/23  3244     History {Add pertinent medical, surgical, social history, OB history to HPI:1} Chief Complaint  Patient presents with   Left Arm Pain    [name] is a 52 y.o. female.  Patient states she fell on her left shoulder.  She has a history of schizophrenia   Shoulder Pain      Home Medications Prior to Admission medications   Medication Sig Start Date End Date Taking? Authorizing Provider  ibuprofen (ADVIL) 800 MG tablet Take 1 tablet (800 mg total) by mouth every 8 (eight) hours as needed for moderate pain. 02/19/23  Yes Bethann Berkshire, MD  albuterol (VENTOLIN HFA) 108 (90 Base) MCG/ACT inhaler Inhale 2 puffs into the lungs 4 (four) times daily as needed for wheezing or shortness of breath.    [provider]  ascorbic acid (VITAMIN C) 500 MG tablet Take 1 tablet (500 mg total) by mouth 2 (two) times daily. 05/10/22   Sarita Bottom, MD  budesonide-formoterol (SYMBICORT) 160-4.5 MCG/ACT inhaler Inhale 2 puffs into the lungs daily. 10/22/22   Mesner, Barbara Cower, MD  divalproex (DEPAKOTE ER) 500 MG 24 hr tablet Take 2 tablets (1,000 mg total) by mouth at bedtime. 05/10/22   Sarita Bottom, MD  fluticasone (FLONASE) 50 MCG/ACT nasal spray Place 1 spray into both nostrils daily. 01/17/23   Smoot, Shawn Route, PA-C  metFORMIN (GLUCOPHAGE) 500 MG tablet Take 1 tablet (500 mg total) by mouth daily with breakfast. 09/15/22 10/15/22  Gailen Shelter, PA  ondansetron (ZOFRAN-ODT) 4 MG disintegrating tablet Take 1 tablet (4 mg total) by mouth every 8 (eight) hours as needed for nausea or vomiting. 09/15/22   Gailen Shelter, PA  paliperidone (INVEGA SUSTENNA) 234 MG/1.5ML injection Inject 234 mg into the muscle every 28 (twenty-eight) days. 06/02/22   Sarita Bottom, MD  traZODone (DESYREL) 50 MG tablet Take 1 tablet (50 mg total) by mouth at bedtime as needed for sleep.  09/15/22   Gailen Shelter, PA  ferrous sulfate 325 (65 FE) MG tablet Take 1 tablet (325 mg total) by mouth daily with breakfast. Patient not taking: No sig reported 12/24/18 04/20/19  Malvin Johns, MD      Allergies    Milk-related compounds, Pollen extract, Pork-derived products, Haldol [haloperidol decanoate], Penicillins, and Shrimp [shellfish allergy]    Review of Systems   Review of Systems  Physical Exam Updated Vital Signs BP (!) 132/104   Pulse 86   Temp 98.4 F (36.9 C) (Oral)   Resp 17   SpO2 97%  Physical Exam  ED Results / Procedures / Treatments   Labs (all labs ordered are listed, but only abnormal results are displayed) Labs Reviewed - No data to display  EKG None  Radiology DG Shoulder Left  Result Date: 02/19/2023 CLINICAL DATA:  Left arm pain since yesterday. EXAM: LEFT SHOULDER - 2+ VIEW COMPARISON:  None available FINDINGS: Normal bone mineralization. The glenohumeral and acromioclavicular joints are appropriately aligned and maintained. There is curvilinear calcification superior to the humeral head on frontal view measuring up to approximately 3.1 x 0.8 cm (transverse by craniocaudal). No acute fracture or dislocation. IMPRESSION: 1. Calcifications compatible with superior rotator cuff chronic calcific tendinosis. 2. No acute fracture or dislocation. Electronically Signed   By: Neita Garnet M.D.   On: 02/19/2023 09:50    Procedures Procedures  {Document cardiac monitor, telemetry assessment procedure when  appropriate:1}  Medications Ordered in ED Medications - No data to display  ED Course/ Medical Decision Making/ A&P   {   Click here for ABCD2, HEART and other calculatorsREFRESH Note before signing :1}                          Medical Decision Making Amount and/or Complexity of Data Reviewed Radiology: ordered.  Risk Prescription drug management.   Contusion and tenderness to left shoulder.  Patient will be given a sling and Motrin and  follow-up with orthopedics  {Document critical care time when appropriate:1} {Document review of labs and clinical decision tools ie heart score, Chads2Vasc2 etc:1}  {Document your independent review of radiology images, and any outside records:1} {Document your discussion with family members, caretakers, and with consultants:1} {Document social determinants of health affecting pt's care:1} {Document your decision making why or why not admission, treatments were needed:1} Final Clinical Impression(s) / ED Diagnoses Final diagnoses:  Acute pain of left shoulder    Rx / DC Orders ED Discharge Orders          Ordered    ibuprofen (ADVIL) 800 MG tablet  Every 8 hours PRN        02/19/23 1006

## 2023-05-14 ENCOUNTER — Emergency Department (HOSPITAL_COMMUNITY)
Admission: EM | Admit: 2023-05-14 | Discharge: 2023-05-14 | Disposition: A | Discharge status: Home / Self Care | Payer: Medicare HMO | Attending: Emergency Medicine | Admitting: Emergency Medicine

## 2023-05-14 DIAGNOSIS — J45909 Unspecified asthma, uncomplicated: Secondary | ICD-10-CM | POA: Insufficient documentation

## 2023-05-14 DIAGNOSIS — I10 Essential (primary) hypertension: Secondary | ICD-10-CM | POA: Insufficient documentation

## 2023-05-14 DIAGNOSIS — R519 Headache, unspecified: Secondary | ICD-10-CM | POA: Insufficient documentation

## 2023-05-14 MED ORDER — FLUTICASONE PROPIONATE 50 MCG/ACT NA SUSP: 1.0000 | Freq: Every day | NASAL | 0 refills | Status: DC

## 2023-05-14 MED ORDER — IBUPROFEN 800 MG PO TABS
800.0000 mg | ORAL_TABLET | Freq: Once | ORAL | Status: AC
  Administered 2023-05-14: 800 mg via ORAL
  Filled 2023-05-14: qty 1

## 2023-05-14 NOTE — ED Triage Notes (Signed)
Pt reports HA from a few mins to a few hours, hx of schizoaffective. Also per EMS pt arrived with phone and purse , EMS reports pt does not have her cards or ID

## 2023-05-14 NOTE — Discharge Instructions (Signed)
You have been seen today for your complaint of headache. Your discharge medications include Flonase.  I have sent a prescription to your pharmacy at your request. Follow up with: Your PCP in 1 week Please seek immediate medical care if you develop any of the following symptoms: Your headache: Gets very bad quickly. Gets worse after a lot of physical activity. You have any of these symptoms: You continue to vomit. A stiff neck. Trouble seeing. Your eye or ear hurts. Trouble speaking. Weak muscles or you lose muscle control. You lose your balance or have trouble walking. You feel like you will pass out (faint) or you pass out. You are mixed up (confused). You have a seizure. At this time there does not appear to be the presence of an emergent medical condition, however there is always the potential for conditions to change. Please read and follow the below instructions.  Do not take your medicine if  develop an itchy rash, swelling in your mouth or lips, or difficulty breathing; call 911 and seek immediate emergency medical attention if this occurs.  You may review your lab tests and imaging results in their entirety on your MyChart account.  Please discuss all results of fully with your primary care provider and other specialist at your follow-up visit.  Note: Portions of this text may have been transcribed using voice recognition software. Every effort was made to ensure accuracy; however, inadvertent computerized transcription errors may still be present.

## 2023-05-14 NOTE — ED Provider Notes (Signed)
[place] EMERGENCY DEPARTMENT AT [facility] Provider Note   CSN: [id number] Arrival date & time: 05/14/23  0211     History  Chief Complaint  Patient presents with   Headache    [name] is a 52 y.o. female.  With a history of schizoaffective disorder, anxiety, depression, bipolar 1 disorder, hypertension, asthma presenting for evaluation of a headache.  She states his headache got progressively worse over the hour prior to her calling EMS.  Headache improved en route to the ED.  Reports her headache is now a 5 out of 10 in mild.  Headache is described as a pressure localized to a headband distribution across the forehead.  No vision changes, fevers, neck stiffness, nausea, vomiting, numbness, weakness or tingling.  Patient is requesting Advil and states she will "get out of our hair."   Headache      Home Medications Prior to Admission medications   Medication Sig Start Date End Date Taking? Authorizing Provider  fluticasone (FLONASE) 50 MCG/ACT nasal spray Place 1 spray into both nostrils daily. 05/14/23  Yes Aviv Rota, Edsel Petrin, PA-C  albuterol (VENTOLIN HFA) 108 (90 Base) MCG/ACT inhaler Inhale 2 puffs into the lungs 4 (four) times daily as needed for wheezing or shortness of breath.    [provider]  ascorbic acid (VITAMIN C) 500 MG tablet Take 1 tablet (500 mg total) by mouth 2 (two) times daily. 05/10/22   Sarita Bottom, MD  budesonide-formoterol (SYMBICORT) 160-4.5 MCG/ACT inhaler Inhale 2 puffs into the lungs daily. 10/22/22   Mesner, Barbara Cower, MD  divalproex (DEPAKOTE ER) 500 MG 24 hr tablet Take 2 tablets (1,000 mg total) by mouth at bedtime. 05/10/22   Sarita Bottom, MD  fluticasone (FLONASE) 50 MCG/ACT nasal spray Place 1 spray into both nostrils daily. 01/17/23   Smoot, Shawn Route, PA-C  ibuprofen (ADVIL) 800 MG tablet Take 1 tablet (800 mg total) by mouth every 8 (eight) hours as needed for moderate pain. 02/19/23   Bethann Berkshire, MD  metFORMIN  (GLUCOPHAGE) 500 MG tablet Take 1 tablet (500 mg total) by mouth daily with breakfast. 09/15/22 10/15/22  Gailen Shelter, PA  ondansetron (ZOFRAN-ODT) 4 MG disintegrating tablet Take 1 tablet (4 mg total) by mouth every 8 (eight) hours as needed for nausea or vomiting. 09/15/22   Gailen Shelter, PA  paliperidone (INVEGA SUSTENNA) 234 MG/1.5ML injection Inject 234 mg into the muscle every 28 (twenty-eight) days. 06/02/22   Sarita Bottom, MD  traZODone (DESYREL) 50 MG tablet Take 1 tablet (50 mg total) by mouth at bedtime as needed for sleep. 09/15/22   Gailen Shelter, PA  ferrous sulfate 325 (65 FE) MG tablet Take 1 tablet (325 mg total) by mouth daily with breakfast. Patient not taking: No sig reported 12/24/18 04/20/19  Malvin Johns, MD      Allergies    Milk-related compounds, Pollen extract, Pork-derived products, Haldol [haloperidol decanoate], Penicillins, and Shrimp [shellfish allergy]    Review of Systems   Review of Systems  Neurological:  Positive for headaches.  All other systems reviewed and are negative.   Physical Exam Updated Vital Signs BP 112/82   Pulse 60   Temp 97.8 F (36.6 C) (Oral)   Resp 16   SpO2 99%  Physical Exam Vitals and nursing note reviewed.  Constitutional:      General: She is not in acute distress.    Appearance: She is well-developed.  HENT:     Head: Normocephalic and atraumatic.  Eyes:  Conjunctiva/sclera: Conjunctivae normal.  Cardiovascular:     Rate and Rhythm: Normal rate and regular rhythm.     Heart sounds: No murmur heard. Pulmonary:     Effort: Pulmonary effort is normal. No respiratory distress.     Breath sounds: Normal breath sounds.  Abdominal:     Palpations: Abdomen is soft.     Tenderness: There is no abdominal tenderness.  Musculoskeletal:        General: No swelling.     Cervical back: Neck supple.  Skin:    General: Skin is warm and dry.     Capillary Refill: Capillary refill takes less than 2 seconds.   Neurological:     Mental Status: She is alert and oriented to person, place, and time.     Comments:   MENTAL STATUS: AAOx3   LANG/SPEECH: Fluent, intact naming, repetition & comprehension   CRANIAL NERVES:   II: Pupils equal and reactive   III, IV, VI: EOM intact, no gaze preference or deviation, no nystagmus   V: normal sensation of the face   VII: no facial asymmetry   VIII: normal hearing to speech   MOTOR: 5/5 in both upper and lower extremities   SENSORY: Normal to touch in all extremiteis   COORD: Normal finger to nose, heel to shin and shoulder shrug, no tremor, no dysmetria. No pronator drift   Psychiatric:        Mood and Affect: Mood normal.     Comments: Alert and oriented, occasional tangential conversation     ED Results / Procedures / Treatments   Labs (all labs ordered are listed, but only abnormal results are displayed) Labs Reviewed - No data to display  EKG None  Radiology No results found.  Procedures Procedures    Medications Ordered in ED Medications  ibuprofen (ADVIL) tablet 800 mg (800 mg Oral Given 05/14/23 0309)    ED Course/ Medical Decision Making/ A&P                                 Medical Decision Making Risk Prescription drug management.   This patient presents to the ED for concern of headache, this involves an extensive number of treatment options, and is a complaint that carries with it a high risk of complications and morbidity. Emergent considerations for headache include subarachnoid hemorrhage, meningitis, temporal arteritis, glaucoma, cerebral ischemia, carotid/vertebral dissection, intracranial tumor, Venous sinus thrombosis, carbon monoxide poisoning, acute or chronic subdural hemorrhage.  Other considerations include: Migraine, Cluster headache, Hypertension, Caffeine, alcohol, or drug withdrawal, Pseudotumor cerebri, Arteriovenous malformation, Head injury, Neurocysticercosis, Post-lumbar puncture, Preeclampsia, Tension  headache, Sinusitis, Cervical arthritis, Refractive error causing strain, Dental abscess, Otitis media, Temporomandibular joint syndrome, Depression, Somatoform disorder (eg, somatization) Trigeminal neuralgia, Glossopharyngeal neuralgia.   My initial workup includes Motrin  Additional history obtained from: Nursing notes from this visit.  Afebrile, hemodynamically stable.  52 year old female presenting for evaluation of a headache.  This is gradual in onset.  It has progressively improved.  It fully resolved with Motrin in the ED.  Patient declined labs, imaging, IV medications and was requesting only oral ibuprofen.  She displays no red flag symptoms of meningitis, SAH, other acute emergent causes for headache.  Overall suspect this is a tension type headache.  Patient is also requesting Flonase.  Prescription sent.  She was encouraged to follow-up with her PCP and to return to the ED with any new or worsening  symptoms.  Stable at discharge.  At this time there does not appear to be any evidence of an acute emergency medical condition and the patient appears stable for discharge with appropriate outpatient follow up. Diagnosis was discussed with patient who verbalizes understanding of care plan and is agreeable to discharge. I have discussed return precautions with patient who verbalizes understanding. Patient encouraged to follow-up with their PCP within 1 week. All questions answered.  Note: Portions of this report may have been transcribed using voice recognition software. Every effort was made to ensure accuracy; however, inadvertent computerized transcription errors may still be present.        Final Clinical Impression(s) / ED Diagnoses Final diagnoses:  Bad headache    Rx / DC Orders ED Discharge Orders          Ordered    fluticasone (FLONASE) 50 MCG/ACT nasal spray  Daily        05/14/23 0413              Michelle Piper, PA-C 05/14/23 0414    Tilden Fossa,  MD 05/14/23 0425

## 2023-05-31 ENCOUNTER — Encounter (HOSPITAL_COMMUNITY): Payer: Self-pay | Admitting: Emergency Medicine

## 2023-05-31 ENCOUNTER — Other Ambulatory Visit: Payer: Self-pay

## 2023-05-31 ENCOUNTER — Emergency Department (HOSPITAL_COMMUNITY): Payer: Medicare HMO

## 2023-05-31 ENCOUNTER — Emergency Department (HOSPITAL_COMMUNITY)
Admission: EM | Admit: 2023-05-31 | Discharge: 2023-05-31 | Disposition: A | Discharge status: Home / Self Care | Payer: Medicare HMO | Attending: Emergency Medicine | Admitting: Emergency Medicine

## 2023-05-31 DIAGNOSIS — R079 Chest pain, unspecified: Secondary | ICD-10-CM | POA: Insufficient documentation

## 2023-05-31 LAB — CBC
HCT: 34.5 % — ABNORMAL LOW (ref 36.0–46.0)
Hemoglobin: 11.1 g/dL — ABNORMAL LOW (ref 12.0–15.0)
MCH: 29 pg (ref 26.0–34.0)
MCHC: 32.2 g/dL (ref 30.0–36.0)
MCV: 90.1 fL (ref 80.0–100.0)
Platelets: 271 10*3/uL (ref 150–400)
RBC: 3.83 MIL/uL — ABNORMAL LOW (ref 3.87–5.11)
RDW: 14.1 % (ref 11.5–15.5)
WBC: 7.2 10*3/uL (ref 4.0–10.5)
nRBC: 0 % (ref 0.0–0.2)

## 2023-05-31 LAB — BASIC METABOLIC PANEL
Anion gap: 9 (ref 5–15)
BUN: 12 mg/dL (ref 6–20)
CO2: 22 mmol/L (ref 22–32)
Calcium: 9.2 mg/dL (ref 8.9–10.3)
Chloride: 107 mmol/L (ref 98–111)
Creatinine, Ser: 0.79 mg/dL (ref 0.44–1.00)
GFR, Estimated: >60 mL/min (ref >60)
Glucose, Bld: 97 mg/dL (ref 70–99)
Potassium: 3.4 mmol/L — ABNORMAL LOW (ref 3.5–5.1)
Sodium: 138 mmol/L (ref 135–145)

## 2023-05-31 LAB — TROPONIN I (HIGH SENSITIVITY): Troponin I (High Sensitivity): 2 ng/L (ref <18)

## 2023-05-31 LAB — HCG, SERUM, QUALITATIVE: Preg, Serum: NEGATIVE

## 2023-05-31 NOTE — Discharge Instructions (Signed)
Please get in contact with your social worker and let them know that you are worried about where you live.

## 2023-05-31 NOTE — ED Notes (Signed)
Discharge instructions provided by edp were discussed with pt. Pt verbalized understanding with no additional questions at this time.  

## 2023-05-31 NOTE — ED Triage Notes (Signed)
Pt c/o central chest pain with some SHOB. Pt denies SI/HI

## 2023-05-31 NOTE — ED Provider Notes (Signed)
[place] EMERGENCY DEPARTMENT AT [facility] Provider Note   CSN: [id number] Arrival date & time: 05/31/23  0025     History  Chief Complaint  Patient presents with   Chest Pain    [name] is a 52 y.o. female.  52 yo F with a cc of chest pain.  Going on for about 5 or 6 hours.  Upper portion of the chest.  Sometimes radiates down to the left side.  Pinpoint.  Started when she was at rest.  Nothing seems to make it better or worse.   Chest Pain      Home Medications Prior to Admission medications   Medication Sig Start Date End Date Taking? Authorizing Provider  albuterol (VENTOLIN HFA) 108 (90 Base) MCG/ACT inhaler Inhale 2 puffs into the lungs 4 (four) times daily as needed for wheezing or shortness of breath.    [provider]  ascorbic acid (VITAMIN C) 500 MG tablet Take 1 tablet (500 mg total) by mouth 2 (two) times daily. 05/10/22   Sarita Bottom, MD  budesonide-formoterol (SYMBICORT) 160-4.5 MCG/ACT inhaler Inhale 2 puffs into the lungs daily. 10/22/22   Mesner, Barbara Cower, MD  divalproex (DEPAKOTE ER) 500 MG 24 hr tablet Take 2 tablets (1,000 mg total) by mouth at bedtime. 05/10/22   Sarita Bottom, MD  fluticasone (FLONASE) 50 MCG/ACT nasal spray Place 1 spray into both nostrils daily. 01/17/23   Smoot, Shawn Route, PA-C  fluticasone (FLONASE) 50 MCG/ACT nasal spray Place 1 spray into both nostrils daily. 05/14/23   Schutt, Edsel Petrin, PA-C  ibuprofen (ADVIL) 800 MG tablet Take 1 tablet (800 mg total) by mouth every 8 (eight) hours as needed for moderate pain. 02/19/23   Bethann Berkshire, MD  metFORMIN (GLUCOPHAGE) 500 MG tablet Take 1 tablet (500 mg total) by mouth daily with breakfast. 09/15/22 10/15/22  Gailen Shelter, PA  ondansetron (ZOFRAN-ODT) 4 MG disintegrating tablet Take 1 tablet (4 mg total) by mouth every 8 (eight) hours as needed for nausea or vomiting. 09/15/22   Gailen Shelter, PA  paliperidone (INVEGA SUSTENNA) 234 MG/1.5ML injection Inject  234 mg into the muscle every 28 (twenty-eight) days. 06/02/22   Sarita Bottom, MD  traZODone (DESYREL) 50 MG tablet Take 1 tablet (50 mg total) by mouth at bedtime as needed for sleep. 09/15/22   Gailen Shelter, PA  ferrous sulfate 325 (65 FE) MG tablet Take 1 tablet (325 mg total) by mouth daily with breakfast. Patient not taking: No sig reported 12/24/18 04/20/19  Malvin Johns, MD      Allergies    Milk-related compounds, Pollen extract, Pork-derived products, Haldol [haloperidol decanoate], Penicillins, and Shrimp [shellfish allergy]    Review of Systems   Review of Systems  Cardiovascular:  Positive for chest pain.    Physical Exam Updated Vital Signs BP 117/69 (BP Location: Left Arm)   Pulse 72   Temp 98.1 F (36.7 C) (Oral)   Resp 20   Ht 5\' 8"  (1.727 m)   Wt 106.6 kg   SpO2 99%   BMI 35.73 kg/m  Physical Exam Vitals and nursing note reviewed.  Constitutional:      General: She is not in acute distress.    Appearance: She is well-developed. She is not diaphoretic.  HENT:     Head: Normocephalic and atraumatic.  Eyes:     Pupils: Pupils are equal, round, and reactive to light.  Cardiovascular:     Rate and Rhythm: Normal rate and regular rhythm.  Heart sounds: No murmur heard.    No friction rub. No gallop.  Pulmonary:     Effort: Pulmonary effort is normal.     Breath sounds: No wheezing or rales.  Abdominal:     General: There is no distension.     Palpations: Abdomen is soft.     Tenderness: There is no abdominal tenderness.  Musculoskeletal:        General: No tenderness.     Cervical back: Normal range of motion and neck supple.  Skin:    General: Skin is warm and dry.  Neurological:     Mental Status: She is alert and oriented to person, place, and time.  Psychiatric:        Behavior: Behavior normal.     ED Results / Procedures / Treatments   Labs (all labs ordered are listed, but only abnormal results are displayed) Labs Reviewed  BASIC  METABOLIC PANEL - Abnormal; Notable for the following components:      Result Value   Potassium 3.4 (*)    All other components within normal limits  CBC - Abnormal; Notable for the following components:   RBC 3.83 (*)    Hemoglobin 11.1 (*)    HCT 34.5 (*)    All other components within normal limits  HCG, SERUM, QUALITATIVE  TROPONIN I (HIGH SENSITIVITY)  TROPONIN I (HIGH SENSITIVITY)    EKG EKG Interpretation Date/Time:  Thursday May 31 2023 00:39:01 EDT Ventricular Rate:  81 PR Interval:  144 QRS Duration:  88 QT Interval:  379 QTC Calculation: 440 R Axis:   44  Text Interpretation: Sinus rhythm Borderline low voltage, extremity leads Abnormal R-wave progression, late transition No significant change since last tracing Confirmed by Melene Plan [id number]) on 05/31/2023 12:48:23 AM  Radiology DG Chest 2 View  Result Date: 05/31/2023 CLINICAL DATA:  Chest pain, shortness of breath EXAM: CHEST - 2 VIEW COMPARISON:  10/21/2022 FINDINGS: The heart size and mediastinal contours are within normal limits. Both lungs are clear. The visualized skeletal structures are unremarkable. IMPRESSION: Normal study. Electronically Signed   By: Charlett Nose M.D.   On: 05/31/2023 00:52    Procedures Procedures    Medications Ordered in ED Medications - No data to display  ED Course/ Medical Decision Making/ A&P                                 Medical Decision Making Amount and/or Complexity of Data Reviewed Labs: ordered. Radiology: ordered.   52 yo F with a chief complaints of chest pain.  Started about 5 or 6 hours prior to arrival.  EKG is nonischemic, troponin negative.  Chest x-ray independently interpreted by me without focal infiltrate or pneumothorax.  Patient brought up as separate complaints about how she is worried about things where she lives.  She wonders if she can talk to a Child psychotherapist about maybe moving to a different group home.  I discussed with her about following  up with her social worker where she lives.  2:51 AM:  I have discussed the diagnosis/risks/treatment options with the patient.  Evaluation and diagnostic testing in the emergency department does not suggest an emergent condition requiring admission or immediate intervention beyond what has been performed at this time.  They will follow up with PCP. We also discussed returning to the ED immediately if new or worsening sx occur. We discussed the sx which are most  concerning (e.g., sudden worsening pain, fever, inability to tolerate by mouth) that necessitate immediate return. Medications administered to the patient during their visit and any new prescriptions provided to the patient are listed below.  Medications given during this visit Medications - No data to display   The patient appears reasonably screen and/or stabilized for discharge and I doubt any other medical condition or other [facility] requiring further screening, evaluation, or treatment in the ED at this time prior to discharge.          Final Clinical Impression(s) / ED Diagnoses Final diagnoses:  Nonspecific chest pain    Rx / DC Orders ED Discharge Orders     None         Melene Plan, DO 05/31/23 8469

## 2023-06-22 ENCOUNTER — Other Ambulatory Visit: Payer: Self-pay

## 2023-06-22 ENCOUNTER — Emergency Department (HOSPITAL_COMMUNITY)
Admission: EM | Admit: 2023-06-22 | Discharge: 2023-06-24 | Disposition: A | Discharge status: Home / Self Care | Payer: Medicare HMO | Attending: Emergency Medicine | Admitting: Emergency Medicine

## 2023-06-22 DIAGNOSIS — F411 Generalized anxiety disorder: Secondary | ICD-10-CM | POA: Insufficient documentation

## 2023-06-22 DIAGNOSIS — F29 Unspecified psychosis not due to a substance or known physiological condition: Secondary | ICD-10-CM

## 2023-06-22 DIAGNOSIS — Z1152 Encounter for screening for COVID-19: Secondary | ICD-10-CM | POA: Insufficient documentation

## 2023-06-22 DIAGNOSIS — R441 Visual hallucinations: Secondary | ICD-10-CM | POA: Diagnosis present

## 2023-06-22 DIAGNOSIS — F259 Schizoaffective disorder, unspecified: Secondary | ICD-10-CM | POA: Insufficient documentation

## 2023-06-22 DIAGNOSIS — F3112 Bipolar disorder, current episode manic without psychotic features, moderate: Secondary | ICD-10-CM | POA: Insufficient documentation

## 2023-06-22 DIAGNOSIS — F3110 Bipolar disorder, current episode manic without psychotic features, unspecified: Secondary | ICD-10-CM | POA: Diagnosis present

## 2023-06-22 DIAGNOSIS — R200 Anesthesia of skin: Secondary | ICD-10-CM | POA: Diagnosis present

## 2023-06-22 DIAGNOSIS — Z79899 Other long term (current) drug therapy: Secondary | ICD-10-CM | POA: Insufficient documentation

## 2023-06-22 LAB — COMPREHENSIVE METABOLIC PANEL
ALT: 11 U/L (ref 0–44)
AST: 15 U/L (ref 15–41)
Albumin: 3.6 g/dL (ref 3.5–5.0)
Alkaline Phosphatase: 62 U/L (ref 38–126)
Anion gap: 5 (ref 5–15)
BUN: 12 mg/dL (ref 6–20)
CO2: 25 mmol/L (ref 22–32)
Calcium: 8.6 mg/dL — ABNORMAL LOW (ref 8.9–10.3)
Chloride: 110 mmol/L (ref 98–111)
Creatinine, Ser: 0.83 mg/dL (ref 0.44–1.00)
GFR, Estimated: >60 mL/min (ref >60)
Glucose, Bld: 107 mg/dL — ABNORMAL HIGH (ref 70–99)
Potassium: 4 mmol/L (ref 3.5–5.1)
Sodium: 140 mmol/L (ref 135–145)
Total Bilirubin: 0.4 mg/dL (ref 0.3–1.2)
Total Protein: 6.9 g/dL (ref 6.5–8.1)

## 2023-06-22 LAB — CBC
HCT: 32.8 % — ABNORMAL LOW (ref 36.0–46.0)
Hemoglobin: 10.6 g/dL — ABNORMAL LOW (ref 12.0–15.0)
MCH: 28.6 pg (ref 26.0–34.0)
MCHC: 32.3 g/dL (ref 30.0–36.0)
MCV: 88.4 fL (ref 80.0–100.0)
Platelets: 231 10*3/uL (ref 150–400)
RBC: 3.71 MIL/uL — ABNORMAL LOW (ref 3.87–5.11)
RDW: 13.7 % (ref 11.5–15.5)
WBC: 6.5 10*3/uL (ref 4.0–10.5)
nRBC: 0 % (ref 0.0–0.2)

## 2023-06-22 LAB — RAPID URINE DRUG SCREEN, HOSP PERFORMED
Amphetamines: NOT DETECTED
Barbiturates: NOT DETECTED
Benzodiazepines: NOT DETECTED
Cocaine: NOT DETECTED
Opiates: NOT DETECTED
Tetrahydrocannabinol: NOT DETECTED

## 2023-06-22 LAB — HCG, SERUM, QUALITATIVE: Preg, Serum: NEGATIVE

## 2023-06-22 LAB — ETHANOL: Alcohol, Ethyl (B): <10 mg/dL (ref <10)

## 2023-06-22 NOTE — ED Notes (Signed)
Pt changed into purple scrubs. Pt sitting in triage with psych folder.

## 2023-06-22 NOTE — ED Notes (Signed)
Pt stated that she wanted to wait to be triaged due to leaving her wallet in the back of a police car.

## 2023-06-22 NOTE — ED Triage Notes (Signed)
Patient requesting psychiatric treatment for her depression / visual hallucinations and anxiety , denies SI or HI .

## 2023-06-23 DIAGNOSIS — F3112 Bipolar disorder, current episode manic without psychotic features, moderate: Secondary | ICD-10-CM | POA: Diagnosis not present

## 2023-06-23 LAB — RESP PANEL BY RT-PCR (RSV, FLU A&B, COVID)  RVPGX2
Influenza A by PCR: NEGATIVE
Influenza B by PCR: NEGATIVE
Resp Syncytial Virus by PCR: NEGATIVE
SARS Coronavirus 2 by RT PCR: NEGATIVE

## 2023-06-23 MED ORDER — DIVALPROEX SODIUM ER 500 MG PO TB24
500.0000 mg | ORAL_TABLET | Freq: Every day | ORAL | Status: DC
  Administered 2023-06-23 – 2023-06-24 (×2): 500 mg via ORAL
  Filled 2023-06-23 (×2): qty 1

## 2023-06-23 MED ORDER — PALIPERIDONE ER 3 MG PO TB24
3.0000 mg | ORAL_TABLET | Freq: Every day | ORAL | Status: DC
  Administered 2023-06-23 – 2023-06-24 (×2): 3 mg via ORAL
  Filled 2023-06-23 (×3): qty 1

## 2023-06-23 NOTE — ED Notes (Signed)
[name] belongings placed into locker #`1 in purple zone

## 2023-06-23 NOTE — ED Provider Notes (Signed)
[place] EMERGENCY DEPARTMENT AT [facility] Provider Note   CSN: [id number] Arrival date & time: 06/22/23  2132     History  Chief Complaint  Patient presents with   Depression / Hallucinations     [name] is a 52 y.o. female with history of schizoaffective disorder who presents with report of visual hallucinations and anxiety.  Patient delusional in her speech, perseverating on the idea that her entire apartment complex is trying to force her to become transgender, states that people are taking her things and accusing her of being homosexual.  Patient paranoid that there are people trying to harm her.  Patient with pressured rapid speech, does not answer direct questions from this provider.  Denies acute physical concerns.  Denies SI or HI.  Unclear AVH.  HPI     Home Medications Prior to Admission medications   Medication Sig Start Date End Date Taking? Authorizing Provider  albuterol (VENTOLIN HFA) 108 (90 Base) MCG/ACT inhaler Inhale 2 puffs into the lungs 4 (four) times daily as needed for wheezing or shortness of breath.    [provider]  ascorbic acid (VITAMIN C) 500 MG tablet Take 1 tablet (500 mg total) by mouth 2 (two) times daily. 05/10/22   Sarita Bottom, MD  budesonide-formoterol (SYMBICORT) 160-4.5 MCG/ACT inhaler Inhale 2 puffs into the lungs daily. 10/22/22   Mesner, Barbara Cower, MD  divalproex (DEPAKOTE ER) 500 MG 24 hr tablet Take 2 tablets (1,000 mg total) by mouth at bedtime. 05/10/22   Sarita Bottom, MD  fluticasone (FLONASE) 50 MCG/ACT nasal spray Place 1 spray into both nostrils daily. 01/17/23   Smoot, Shawn Route, PA-C  fluticasone (FLONASE) 50 MCG/ACT nasal spray Place 1 spray into both nostrils daily. 05/14/23   Schutt, Edsel Petrin, PA-C  ibuprofen (ADVIL) 800 MG tablet Take 1 tablet (800 mg total) by mouth every 8 (eight) hours as needed for moderate pain. 02/19/23   Bethann Berkshire, MD  metFORMIN (GLUCOPHAGE) 500 MG tablet Take 1 tablet (500 mg  total) by mouth daily with breakfast. 09/15/22 10/15/22  Gailen Shelter, PA  ondansetron (ZOFRAN-ODT) 4 MG disintegrating tablet Take 1 tablet (4 mg total) by mouth every 8 (eight) hours as needed for nausea or vomiting. 09/15/22   Gailen Shelter, PA  paliperidone (INVEGA SUSTENNA) 234 MG/1.5ML injection Inject 234 mg into the muscle every 28 (twenty-eight) days. 06/02/22   Sarita Bottom, MD  traZODone (DESYREL) 50 MG tablet Take 1 tablet (50 mg total) by mouth at bedtime as needed for sleep. 09/15/22   Gailen Shelter, PA  ferrous sulfate 325 (65 FE) MG tablet Take 1 tablet (325 mg total) by mouth daily with breakfast. Patient not taking: No sig reported 12/24/18 04/20/19  Malvin Johns, MD      Allergies    Milk-related compounds, Pollen extract, Pork-derived products, Haldol [haloperidol decanoate], Penicillins, and Shrimp [shellfish allergy]    Review of Systems   Review of Systems  Unable to perform ROS: Psychiatric disorder    Physical Exam Updated Vital Signs BP 123/89 (BP Location: Left Arm)   Pulse 60   Temp 98.1 F (36.7 C) (Oral)   Resp 20   SpO2 100%  Physical Exam Vitals and nursing note reviewed.  Constitutional:      Appearance: She is obese. She is not ill-appearing or toxic-appearing.  HENT:     Head: Normocephalic and atraumatic.  Eyes:     General: No scleral icterus.       Right eye:  No discharge.        Left eye: No discharge.     Conjunctiva/sclera: Conjunctivae normal.  Pulmonary:     Effort: Pulmonary effort is normal.  Skin:    General: Skin is warm and dry.  Neurological:     General: No focal deficit present.     Mental Status: She is alert.     GCS: GCS eye subscore is 4. GCS verbal subscore is 5. GCS motor subscore is 6.  Psychiatric:        Mood and Affect: Affect is blunt and flat.        Speech: Speech is rapid and pressured and tangential.        Behavior: Behavior is agitated.        Thought Content: Thought content does not include  homicidal or suicidal ideation.     ED Results / Procedures / Treatments   Labs (all labs ordered are listed, but only abnormal results are displayed) Labs Reviewed  COMPREHENSIVE METABOLIC PANEL - Abnormal; Notable for the following components:      Result Value   Glucose, Bld 107 (*)    Calcium 8.6 (*)    All other components within normal limits  CBC - Abnormal; Notable for the following components:   RBC 3.71 (*)    Hemoglobin 10.6 (*)    HCT 32.8 (*)    All other components within normal limits  ETHANOL  RAPID URINE DRUG SCREEN, HOSP PERFORMED  HCG, SERUM, QUALITATIVE    EKG None  Radiology No results found.  Procedures Procedures    Medications Ordered in ED Medications - No data to display  ED Course/ Medical Decision Making/ A&P Clinical Course as of 06/23/23 9604  Sat Jun 23, 2023  0620 Opiates: NONE DETECTED [RS]    Clinical Course User Index [RS] Santa Lighter Eugene Gavia, PA-C                                 Medical Decision Making 52 year old female presents with paranoid thinking and reported hallucinations.  Mildly hypertensive on intake and vitals otherwise normal.  Amount and/or Complexity of Data Reviewed Labs: ordered. Decision-making details documented in ED Course.    Details: CBC with mild anemia with hemoglobin of 10.6, CMP unremarkable, UDS pan negative   Patient medically cleared at this time, pending TTS evaluation for delusional/paranoid behavior  This chart was dictated using voice recognition software, Dragon. Despite the best efforts of this provider to proofread and correct errors, errors may still occur which can change documentation meaning.         Final Clinical Impression(s) / ED Diagnoses Final diagnoses:  None    Rx / DC Orders ED Discharge Orders     None         Paris Lore, PA-C 06/23/23 5409    Gilda Crease, MD 06/23/23 [phone]

## 2023-06-23 NOTE — Consult Note (Cosign Needed Addendum)
[facility] Face-to-Face Psychiatry Consult   Reason for Consult:   Referring Physician:   Patient Identification: [name] MRN:  [id number] Principal Diagnosis: Bipolar disorder, manic (HCC) Diagnosis:  Principal Problem:   Bipolar disorder, manic (HCC)   Total Time spent with patient: 15 minutes  Subjective:  [name] is a 52 y.o. female carries a diagnosis of schizoaffective disorder, bipolar depression disorder.  Generalized anxiety disorder.  Currently she prescribed Invega, Depakote and trazodone.  She reports she has been taking her medications as indicated.  [name] 52 year old African-American female well-known to this service.  This provider unable to follow the reason for this admission only to states that she feels like she" got shot her leg went numb."  Patient appears to be disorganized slightly pressured.  unsure if patient has been compliant with medications.  Loriel states her parents told her "that I am not acting right."  She provided contact number for her mother just at [phone] no answer.  She is denying suicidal or homicidal ideations.  Denies auditory visual hallucinations.  Chart reviewed UDS negative, orders pending for valproic acid level.  Will restart home meds where appropriate Depakote p.o. twice daily and Invega 3 mg nightly recommend inpatient admission.   Patient presented to the emergency department with bizarre behavior.  Documented patient was a experiencing visual hallucinations, delusional and paranoid. Chart review currently followed by ACT services.  Provider attempted to contact patient's daughter  Letitia [name]- no answer.   During evaluation Mercadies Truxillo is resting in bed. she is alert/oriented x 3; calm/cooperative; and mood congruent with affect.  Patient is speaking in a clear tone at moderate volume, and rapid/ pressured pace; with good eye contact. Her thought process is coherent and relevant; There is no indication that she is currently  responding to internal/external stimuli or experiencing delusional thought content.  Patient denies suicidal/self-harm/homicidal ideation, psychosis, and paranoia.  Patient has remained calm throughout assessment and has answered questions appropriately.   HPI: Per admission assessment note: " [name] is a 52 y.o. female with history of schizoaffective disorder who presents with report of visual hallucinations and anxiety.  Patient delusional in her speech, perseverating on the idea that her entire apartment complex is trying to force her to become transgender, states that people are taking her things and accusing her of being homosexual.  Patient paranoid that there are people trying to harm her.  Patient with pressured rapid speech, does not answer direct questions from this provider.  Denies acute physical concerns.  Denies SI or HI.  Unclear AVH."     Past Psychiatric History:   Risk to Self:   Risk to Others:   Prior Inpatient Therapy:   Prior Outpatient Therapy:    Past Medical History:  Past Medical History:  Diagnosis Date   Anxiety    Asthma    Bipolar 1 disorder (HCC)    Depression    Gallstones 08/2020   Hypertension    Insomnia, persistent    Prediabetes    Schizophrenic disorder (HCC)    Seizures (HCC)     Past Surgical History:  Procedure Laterality Date   BREAST LUMPECTOMY Left 07/2013   TONSILLECTOMY     TUBAL LIGATION  2002   Family History:  Family History  Problem Relation Age of Onset   Depression Mother    Gout Mother    Cancer Father        prostate   Other Father        lung  issue   Alcoholism Other    Heart attack Paternal Grandfather    Heart attack Paternal Grandmother    Heart attack Maternal Grandmother    Heart attack Maternal Grandfather    Depression Son    Anxiety disorder Son    Family Psychiatric  History:  Social History:  Social History   Substance and Sexual Activity  Alcohol Use Not Currently     Social History    Substance and Sexual Activity  Drug Use No    Social History   Socioeconomic History   Marital status: Single    Spouse name: Not on file   Number of children: Not on file   Years of education: Not on file   Highest education level: Not on file  Occupational History   Not on file  Tobacco Use   Smoking status: Former    Types: Cigarettes   Smokeless tobacco: Never  Vaping Use   Vaping status: Never Used  Substance and Sexual Activity   Alcohol use: Not Currently   Drug use: No   Sexual activity: Not Currently  Other Topics Concern   Not on file  Social History Narrative   Not on file   Social Determinants of Health   Financial Resource Strain: Not on file  Food Insecurity: Food Insecurity Present (09/16/2020)   Hunger Vital Sign    Worried About Running Out of Food in the Last Year: Sometimes true    Ran Out of Food in the Last Year: Sometimes true  Transportation Needs: No Transportation Needs (09/16/2020)   PRAPARE - Administrator, Civil Service (Medical): No    Lack of Transportation (Non-Medical): No  Physical Activity: Not on file  Stress: Not on file  Social Connections: Not on file   Additional Social History:    Allergies:   Allergies  Allergen Reactions   Milk-Related Compounds Nausea And Vomiting   Pollen Extract Other (See Comments)    Seasonal allergies   Pork-Derived Products Nausea Only   Haldol [Haloperidol Decanoate] Swelling, Anxiety and Other (See Comments)    Stiffness, eyes bulging   Penicillins Nausea And Vomiting, Swelling and Rash    Has patient had a PCN reaction causing immediate rash, facial/tongue/throat swelling, SOB or lightheadedness with hypotension:UNSURE  Has patient had a PCN reaction causing severe rash involving mucus membranes or skin necrosis: UNSURE Has patient had a PCN reaction that required hospitalization:UNSURE Has patient had a PCN reaction occurring within the last 10 years:No If all of the above  answers are "NO", then may proceed with Cephalosporin use. CHILDHOOD REACTION   Shrimp [Shellfish Allergy] Nausea Only, Rash and Other (See Comments)    Raw shrimp    Labs:  Results for orders placed or performed during the hospital encounter of 06/22/23 (from the past 48 hour(s))  Comprehensive metabolic panel     Status: Abnormal   Collection Time: 06/22/23 10:20 PM  Result Value Ref Range   Sodium 140 135 - 145 mmol/L   Potassium 4.0 3.5 - 5.1 mmol/L   Chloride 110 98 - 111 mmol/L   CO2 25 22 - 32 mmol/L   Glucose, Bld 107 (H) 70 - 99 mg/dL    Comment: Glucose reference range applies only to samples taken after fasting for at least 8 hours.   BUN 12 6 - 20 mg/dL   Creatinine, Ser 0.96 0.44 - 1.00 mg/dL   Calcium 8.6 (L) 8.9 - 10.3 mg/dL   Total Protein 6.9 6.5 -  8.1 g/dL   Albumin 3.6 3.5 - 5.0 g/dL   AST 15 15 - 41 U/L   ALT 11 0 - 44 U/L   Alkaline Phosphatase 62 38 - 126 U/L   Total Bilirubin 0.4 0.3 - 1.2 mg/dL   GFR, Estimated >16 >10 mL/min    Comment: (NOTE) Calculated using the CKD-EPI Creatinine Equation (2021)    Anion gap 5 5 - 15    Comment: Performed at [facility], [address], [place], [place] [address]  Ethanol     Status: None   Collection Time: 06/22/23 10:20 PM  Result Value Ref Range   Alcohol, Ethyl (B) <10 <10 mg/dL    Comment: (NOTE) Lowest detectable limit for serum alcohol is 10 mg/dL.  For medical purposes only. Performed at [facility], [address], [place], [place] [address]   cbc     Status: Abnormal   Collection Time: 06/22/23 10:20 PM  Result Value Ref Range   WBC 6.5 4.0 - 10.5 K/uL   RBC 3.71 (L) 3.87 - 5.11 MIL/uL   Hemoglobin 10.6 (L) 12.0 - 15.0 g/dL   HCT 19.1 (L) 47.8 - 29.5 %   MCV 88.4 80.0 - 100.0 fL   MCH 28.6 26.0 - 34.0 pg   MCHC 32.3 30.0 - 36.0 g/dL   RDW 62.1 30.8 - 65.7 %   Platelets 231 150 - 400 K/uL   nRBC 0.0 0.0 - 0.2 %    Comment: Performed at [facility], [address]  [place], [place] [address]  Rapid urine drug screen (hospital performed)     Status: None   Collection Time: 06/22/23 10:20 PM  Result Value Ref Range   Opiates NONE DETECTED NONE DETECTED   Cocaine NONE DETECTED NONE DETECTED   Benzodiazepines NONE DETECTED NONE DETECTED   Amphetamines NONE DETECTED NONE DETECTED   Tetrahydrocannabinol NONE DETECTED NONE DETECTED   Barbiturates NONE DETECTED NONE DETECTED    Comment: (NOTE) DRUG SCREEN FOR MEDICAL PURPOSES ONLY.  IF CONFIRMATION IS NEEDED FOR ANY PURPOSE, NOTIFY LAB WITHIN 5 DAYS.  LOWEST DETECTABLE LIMITS FOR URINE DRUG SCREEN Drug Class                     Cutoff (ng/mL) Amphetamine and metabolites    1000 Barbiturate and metabolites    200 Benzodiazepine                 200 Opiates and metabolites        300 Cocaine and metabolites        300 THC                            50 Performed at [facility], [address], [place], [place] [address]   hCG, serum, qualitative     Status: None   Collection Time: 06/22/23 10:20 PM  Result Value Ref Range   Preg, Serum NEGATIVE NEGATIVE    Comment:        THE SENSITIVITY OF THIS METHODOLOGY IS >10 mIU/mL. Performed at [facility], [address] [place], [place] [address]     No current facility-administered medications for this encounter.   Current Outpatient Medications  Medication Sig Dispense Refill   albuterol (VENTOLIN HFA) 108 (90 Base) MCG/ACT inhaler Inhale 2 puffs into the lungs 4 (four) times daily as needed for wheezing or shortness of breath.  ascorbic acid (VITAMIN C) 500 MG tablet Take 1 tablet (500 mg total) by mouth 2 (two) times daily. 60 tablet 0   budesonide-formoterol (SYMBICORT) 160-4.5 MCG/ACT inhaler Inhale 2 puffs into the lungs daily. 1 each 12   divalproex (DEPAKOTE ER) 500 MG 24 hr tablet Take 2 tablets (1,000 mg total) by mouth at bedtime. 60 tablet 0   fluticasone (FLONASE) 50 MCG/ACT nasal spray Place 1 spray into both nostrils  daily. 18.2 mL 2   fluticasone (FLONASE) 50 MCG/ACT nasal spray Place 1 spray into both nostrils daily. 11.1 mL 0   ibuprofen (ADVIL) 800 MG tablet Take 1 tablet (800 mg total) by mouth every 8 (eight) hours as needed for moderate pain. 21 tablet 0   metFORMIN (GLUCOPHAGE) 500 MG tablet Take 1 tablet (500 mg total) by mouth daily with breakfast. 30 tablet 0   ondansetron (ZOFRAN-ODT) 4 MG disintegrating tablet Take 1 tablet (4 mg total) by mouth every 8 (eight) hours as needed for nausea or vomiting. 20 tablet 0   paliperidone (INVEGA SUSTENNA) 234 MG/1.5ML injection Inject 234 mg into the muscle every 28 (twenty-eight) days. 1.5 mL 0   traZODone (DESYREL) 50 MG tablet Take 1 tablet (50 mg total) by mouth at bedtime as needed for sleep. 30 tablet 0    Musculoskeletal: Strength & Muscle Tone: within normal limits Gait & Station: normal Patient leans:  noted to have a cane for ambulation assistance   Psychiatric Specialty Exam:  Presentation  General Appearance:  Appropriate for Environment  Eye Contact: Fair  Speech: Clear and Coherent  Speech Volume: Normal  Handedness:Right   Mood and Affect  Mood: Euthymic  Affect: Congruent   Thought Process  Thought Processes: Linear  Descriptions of Associations:Tangential  Orientation:Full (Time, Place and Person)  Thought Content:Paranoid Ideation; Rumination; Tangential  History of Schizophrenia/Schizoaffective disorder:No  Duration of Psychotic Symptoms:N/A  Hallucinations:Hallucinations: Auditory  Ideas of Reference:Paranoia  Suicidal Thoughts:Suicidal Thoughts: No  Homicidal Thoughts:Homicidal Thoughts: No   Sensorium  Memory: Immediate Fair; Remote Good  Judgment: Good  Insight: Fair   Art therapist  Concentration: Fair  Attention Span: Good  Recall: Good  Fund of Knowledge: Good  Language: Good   Psychomotor Activity  Psychomotor Activity:Psychomotor Activity:  Normal   Assets  Assets: Desire for Improvement; Social Support   Sleep  Sleep:Sleep: Fair   Physical Exam: Physical Exam Vitals and nursing note reviewed.  Cardiovascular:     Rate and Rhythm: Normal rate and regular rhythm.  Neurological:     Mental Status: She is alert and oriented to person, place, and time.  Psychiatric:        Mood and Affect: Mood normal.        Thought Content: Thought content normal.    Review of Systems  Psychiatric/Behavioral:  Positive for depression and suicidal ideas. The patient is nervous/anxious.   All other systems reviewed and are negative.  Blood pressure (!) 135/99, pulse 64, temperature 97.8 F (36.6 C), temperature source Oral, resp. rate 16, SpO2 100%. There is no height or weight on file to calculate BMI.  Treatment Plan Summary: Daily contact with patient to assess and evaluate symptoms and progress in treatment and Medication management Will restart home medications where appropriate. -Depakote 500 mg p.o. twice daily -Invega 3 mg p.o. nightly -Awaiting for additional collateral  Disposition: Recommend psychiatric Inpatient admission when medically cleared.  Oneta Rack, NP 06/23/2023 12:14 PM

## 2023-06-23 NOTE — Progress Notes (Addendum)
Pt was accepted to [facility] TOMORROW  06/24/2023; Bed Assignment Susann Givens 3 [facility] PENDING negative COVID faxed to Yvetta Coder [phone]   Pt meets inpatient criteria per Tilford Pillar  Attending Physician will be Danton Sewer, MD  Report can be called to: - [phone]  Pt can arrive after: 7:00am  Care Team notified: [facility], LCSWA 06/23/2023 @ 4:55 PM

## 2023-06-23 NOTE — ED Notes (Signed)
PA at bedside.

## 2023-06-23 NOTE — Progress Notes (Signed)
LCSW Progress Note  [id number]   [name]  06/23/2023  3:31 PM    Inpatient Behavioral Health Placement  Pt meets inpatient criteria per Hillery Jacks, NP. There are no available beds within [facility] per [facility] Kelly Southard,RN. Referral was sent to the following facilities;   Destination  Service Provider Address Phone [facility]  [address] [place], [place] [address] [phone]  [facility]  [address] [place] [address] [phone]  [facility] Patient Placement  [facility], [place] [phone]  [facility] [address]  [place] [address] [phone]  [facility]  [address] [place] [address] [phone]  [facility]  [address] [place] [address] [phone]  [facility]  [address] [place] [address] [phone]  [facility]  [address], [place] [address] [phone]  [facility]  [place] [phone]  [facility]  [address] [place] [address] [phone]  [facility] EFAX  [address] [place], [place] [phone]  [facility]  [address], [place] [address] [phone]  [facility]  [address] [place] [address] [phone]  [facility]  [address] [place] [address] [phone]  [facility]  [address], [place] [address] [phone]  [facility] Inpatient [facility]  [place] [phone]  [facility]  [address], [place] [address] [phone]  [facility]  [address] [place] [address] [phone]  [facility]  [address] [place] [address] [phone]  [facility]  [address] [place] [address] [phone]  [facility]  [address] [place] [address] [phone]  [facility]  [address] [place], [place] [address] [phone]  [facility]  [address] [place] [address] [phone]  [facility]  [address], [place] [address] [phone]  [facility]  [address], [place] [address] [phone]  [facility]  [address], [place] [address] [phone]     Situation ongoing,  CSW will follow up.    Maryjean Ka, MSW, [facility] 06/23/2023 3:31 PM

## 2023-06-24 DIAGNOSIS — F3112 Bipolar disorder, current episode manic without psychotic features, moderate: Secondary | ICD-10-CM | POA: Diagnosis not present

## 2023-06-24 LAB — VALPROIC ACID LEVEL: Valproic Acid Lvl: 16 ug/mL — ABNORMAL LOW (ref 50.0–100.0)

## 2023-06-24 NOTE — ED Notes (Signed)
[facility] has been scheduled for the patient. ETA is 10-20 minutes.

## 2023-06-24 NOTE — Discharge Instructions (Addendum)
Please go via safe transport to your psychiatric facility

## 2023-06-24 NOTE — ED Notes (Signed)
Covid test was fax to [facility] by Kateri Mc the Diplomatic Services operational officer in blue.

## 2023-06-24 NOTE — ED Notes (Signed)
Patient had breakfast and she took a shower

## 2023-06-24 NOTE — ED Provider Notes (Signed)
Emergency Medicine Observation Re-evaluation Note  Aziyah Metoxen is a 52 y.o. female, seen on rounds today.  Pt initially presented to the ED for complaints of Depression / Hallucinations  Currently, the patient is resting comfortably.  Physical Exam  BP (!) 127/93 (BP Location: Left Arm)   Pulse 72   Temp 98.1 F (36.7 C) (Oral)   Resp 18   SpO2 100%  Physical Exam General: NAD Lungs: No respiratory distress  Psych: Resting comfortably  ED Course / MDM  EKG:   I have reviewed the labs performed to date as well as medications administered while in observation.  Recent changes in the last 24 hours include the patient has been accepted at a psych facility.  Plan  Current plan is for transfer to psyc facility for ongoing care.    Durwin Glaze, MD 06/24/23 [phone]

## 2023-06-24 NOTE — ED Notes (Signed)
Report was called to Mack Guise, RN at [phone] to [facility]

## 2023-07-29 IMAGING — CR DG CHEST 2V
2 series · 2 of 2 positions shown · non-contrast
Comparison: February 05, 2021

CLINICAL DATA: Chest pain with shortness of breath and cough.

EXAM:
CHEST - 2 VIEW

[chest pa]
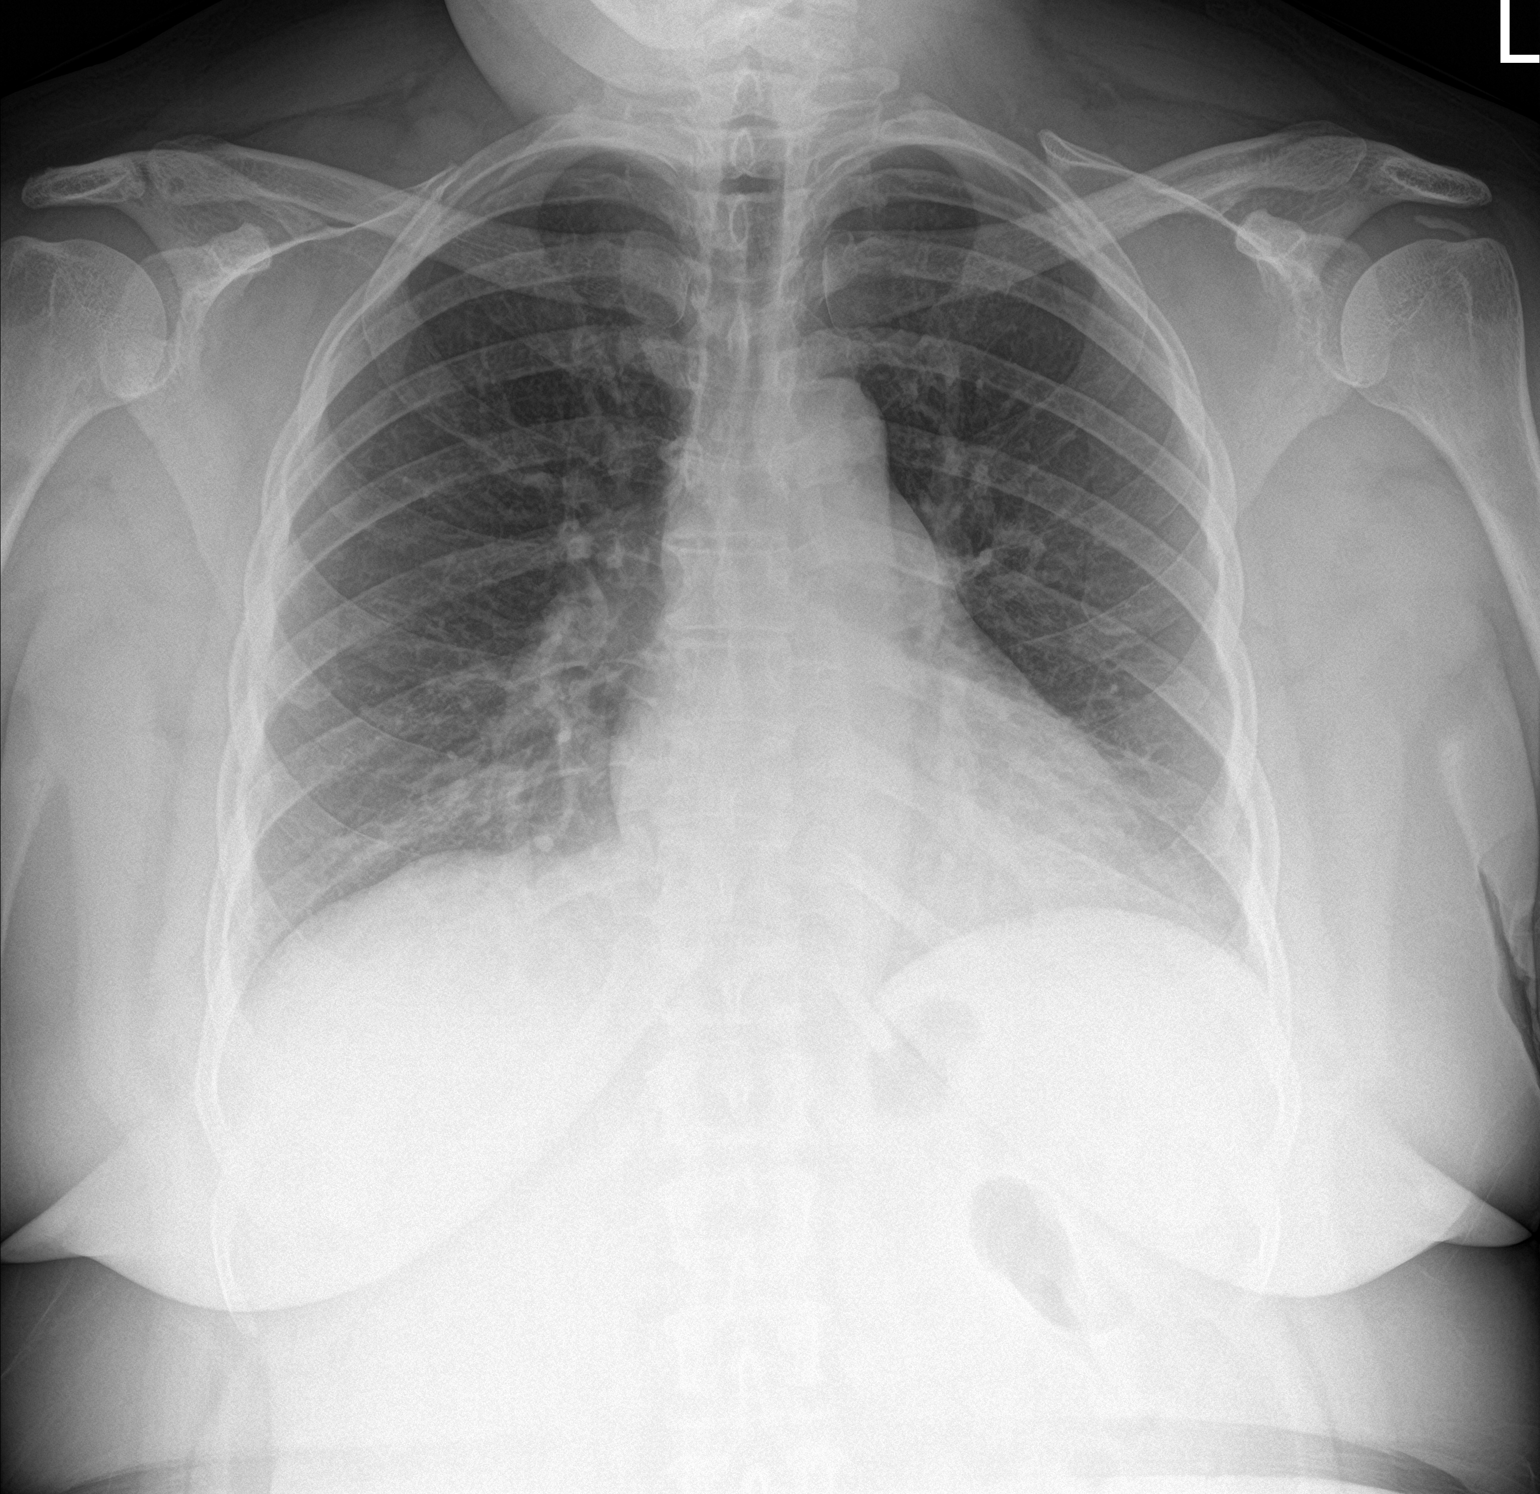

[chest lat]
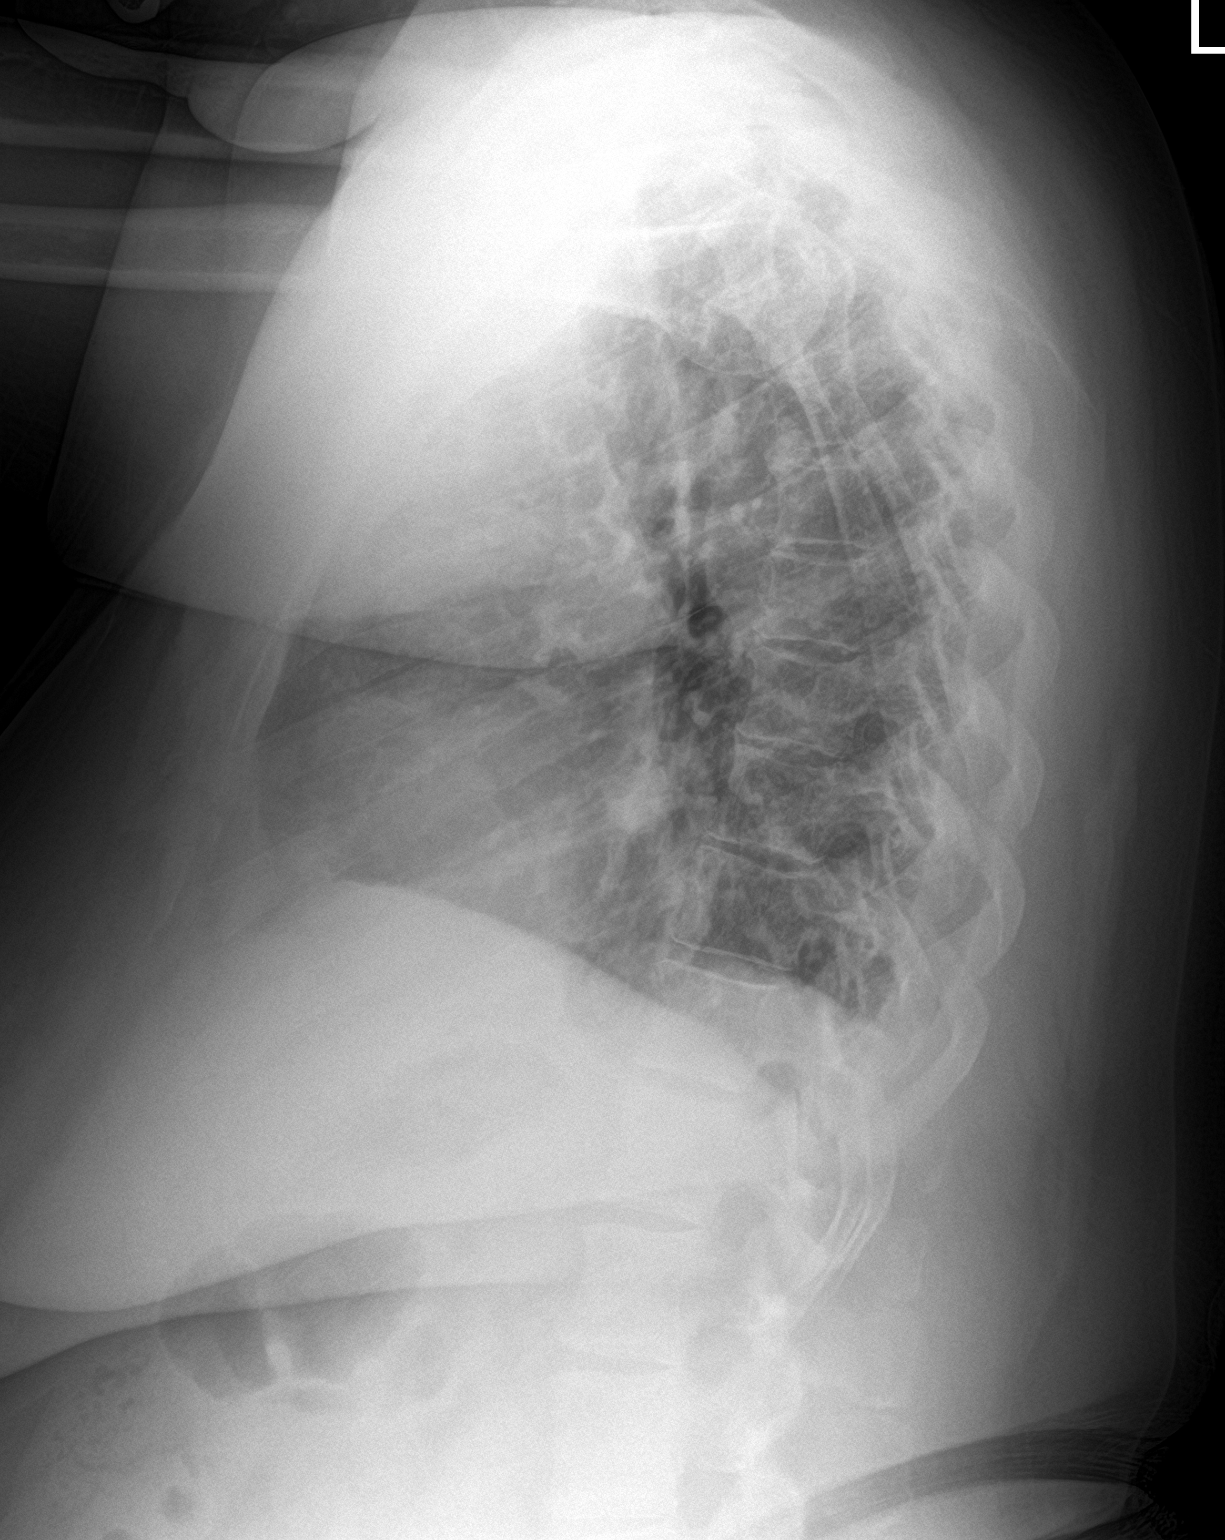

[2 of 2 positions shown; findings below may reference images not displayed]

FINDINGS: Mild crowding of the bronchovascular lung markings is seen within
the bilateral lung bases. There is no evidence of acute infiltrate,
pleural effusion or pneumothorax. The heart size and mediastinal
contours are within normal limits. The visualized skeletal
structures are unremarkable.
IMPRESSION: No active cardiopulmonary disease.

## 2024-01-22 ENCOUNTER — Other Ambulatory Visit: Payer: Self-pay

## 2024-01-22 ENCOUNTER — Emergency Department (HOSPITAL_COMMUNITY)
Admission: EM | Admit: 2024-01-22 | Discharge: 2024-01-23 | Disposition: A | Discharge status: Other Acute Inpatient Hospital | Attending: Emergency Medicine | Admitting: Emergency Medicine

## 2024-01-22 DIAGNOSIS — I10 Essential (primary) hypertension: Secondary | ICD-10-CM | POA: Insufficient documentation

## 2024-01-22 DIAGNOSIS — Z79899 Other long term (current) drug therapy: Secondary | ICD-10-CM | POA: Insufficient documentation

## 2024-01-22 DIAGNOSIS — F309 Manic episode, unspecified: Secondary | ICD-10-CM | POA: Insufficient documentation

## 2024-01-22 DIAGNOSIS — J45909 Unspecified asthma, uncomplicated: Secondary | ICD-10-CM | POA: Insufficient documentation

## 2024-01-22 DIAGNOSIS — F250 Schizoaffective disorder, bipolar type: Secondary | ICD-10-CM | POA: Diagnosis not present

## 2024-01-22 DIAGNOSIS — F259 Schizoaffective disorder, unspecified: Secondary | ICD-10-CM | POA: Diagnosis not present

## 2024-01-22 DIAGNOSIS — R4689 Other symptoms and signs involving appearance and behavior: Secondary | ICD-10-CM | POA: Diagnosis present

## 2024-01-22 NOTE — ED Provider Notes (Signed)
 [facility] EMERGENCY DEPARTMENT AT [facility] Provider Note   CSN: [id number] Arrival date & time: 01/22/24  2301     History No chief complaint on file.   HPI [name] is a 53 y.o. female presenting for chief complaint of congestion.  She states that she has not been sleeping well, patient is very tangential, difficult to redirect.  She lists off that the medical director of [employer] has reached out to her directly and told her that she needs to have her medications managed, that her paperwork says that God is telling her what to do, and that female doctors are coming after her.. Patient is very difficult to redirect during this conversation.  She denies fevers chills nausea vomiting syncope or shortness of breath.  [name] recorded medical, surgical, social, medication list and allergies were reviewed in the Snapshot window as part of the initial history.   Review of Systems   Review of Systems  Constitutional:  Negative for chills and fever.  HENT:  Negative for ear pain and sore throat.   Eyes:  Negative for pain and visual disturbance.  Respiratory:  Negative for cough and shortness of breath.   Cardiovascular:  Negative for chest pain and palpitations.  Gastrointestinal:  Negative for abdominal pain and vomiting.  Genitourinary:  Negative for dysuria and hematuria.  Musculoskeletal:  Negative for arthralgias and back pain.  Skin:  Negative for color change and rash.  Neurological:  Negative for seizures and syncope.  All other systems reviewed and are negative.   Physical Exam Updated Vital Signs BP (!) 144/81   Pulse 77   Temp 97.8 F (36.6 C) (Oral)   Resp 18   SpO2 100%  Physical Exam Vitals and nursing note reviewed.  Constitutional:      General: She is not in acute distress.    Appearance: She is well-developed.  HENT:     Head: Normocephalic and atraumatic.  Eyes:     Conjunctiva/sclera: Conjunctivae normal.  Cardiovascular:     Rate and  Rhythm: Normal rate and regular rhythm.     Heart sounds: No murmur heard. Pulmonary:     Effort: Pulmonary effort is normal. No respiratory distress.     Breath sounds: Normal breath sounds.  Abdominal:     General: There is no distension.     Palpations: Abdomen is soft.     Tenderness: There is no abdominal tenderness. There is no right CVA tenderness or left CVA tenderness.  Musculoskeletal:        General: No swelling or tenderness. Normal range of motion.     Cervical back: Neck supple.  Skin:    General: Skin is warm and dry.  Neurological:     General: No focal deficit present.     Mental Status: She is alert and oriented to person, place, and time. Mental status is at baseline.     Cranial Nerves: No cranial nerve deficit.      ED Course/ Medical Decision Making/ A&P    Procedures Procedures   Medications Ordered in ED Medications  LORazepam  (ATIVAN ) tablet 2 mg (has no administration in time range)   Medical Decision Making:   [name] is a 53 y.o. female who presented to the ED today for psychiatric evaluation.  Patient does have a history of multiple medical and psychiatric conditions for which they are on multiple medications.  They are not compliant with their medications.  On my initial exam, the pt was tangential in  thought, pressured in affect, and overall well-appearing.  Vital signs reviewed and reassuring.  Reviewed and confirmed nursing documentation for past medical history, family history, social history.      Initial Assessment:   This is most consistent with an acute life threatening illness. With the patient's presentation of sleep disturbance, bizarre behaviors, patient warrants emergent psychiatric consultation.  Differential includes primary psychosis, substance-induced psychosis, mood disturbance.  Initial Plan:  Patient immediately placed into ED psychiatric hold protocol including suicide precautions, elopement precautions and vital sign  monitoring.    Emergent behavioral health hold signed and notarized while awaiting psychiatric consultation due to threat to self or others.  Psychiatry consulted for further evaluation once patient medically cleared.  Medical screening evaluation ordered and reviewed with no obvious medical reason to postpone psychiatric evaluation.  Patient is voluntary at this time.  No IVC.  May need to be reassessed if capacity is changing.     Final Assessment and Plan:   Patient placed in a psychiatric hold protocol at this time.  They are currently medically cleared for psychiatric evaluation.      Clinical Impression:  1. Mania (HCC)      Data Unavailable   Final Clinical Impression(s) / ED Diagnoses Final diagnoses:  Mania [facility])    Rx / DC Orders ED Discharge Orders     None         Yvette Bile, MD 01/23/24 [phone]

## 2024-01-22 NOTE — ED Triage Notes (Signed)
 Pt BIB [facility] for congestion. Pt has known psychiatric history. Pt appears manic and has not stopped talking since arrival. Pt states that she doesn't know why she is here

## 2024-01-23 ENCOUNTER — Encounter (HOSPITAL_COMMUNITY): Payer: Self-pay | Admitting: Psychiatry

## 2024-01-23 DIAGNOSIS — F250 Schizoaffective disorder, bipolar type: Secondary | ICD-10-CM | POA: Diagnosis not present

## 2024-01-23 DIAGNOSIS — F259 Schizoaffective disorder, unspecified: Secondary | ICD-10-CM | POA: Diagnosis not present

## 2024-01-23 LAB — CBC WITH DIFFERENTIAL/PLATELET
Abs Immature Granulocytes: 0.01 10*3/uL (ref 0.00–0.07)
Basophils Absolute: 0 10*3/uL (ref 0.0–0.1)
Basophils Relative: 1 %
Eosinophils Absolute: 0.3 10*3/uL (ref 0.0–0.5)
Eosinophils Relative: 5 %
HCT: 36.6 % (ref 36.0–46.0)
Hemoglobin: 11.8 g/dL — ABNORMAL LOW (ref 12.0–15.0)
Immature Granulocytes: 0 %
Lymphocytes Relative: 40 %
Lymphs Abs: 2.2 10*3/uL (ref 0.7–4.0)
MCH: 29.4 pg (ref 26.0–34.0)
MCHC: 32.2 g/dL (ref 30.0–36.0)
MCV: 91.3 fL (ref 80.0–100.0)
Monocytes Absolute: 0.3 10*3/uL (ref 0.1–1.0)
Monocytes Relative: 5 %
Neutro Abs: 2.7 10*3/uL (ref 1.7–7.7)
Neutrophils Relative %: 49 %
Platelets: 263 10*3/uL (ref 150–400)
RBC: 4.01 MIL/uL (ref 3.87–5.11)
RDW: 13.3 % (ref 11.5–15.5)
WBC: 5.6 10*3/uL (ref 4.0–10.5)
nRBC: 0 % (ref 0.0–0.2)

## 2024-01-23 LAB — RESP PANEL BY RT-PCR (RSV, FLU A&B, COVID)  RVPGX2
Influenza A by PCR: NEGATIVE
Influenza B by PCR: NEGATIVE
Resp Syncytial Virus by PCR: NEGATIVE
SARS Coronavirus 2 by RT PCR: NEGATIVE

## 2024-01-23 LAB — BASIC METABOLIC PANEL WITH GFR
Anion gap: 10 (ref 5–15)
BUN: 14 mg/dL (ref 6–20)
CO2: 24 mmol/L (ref 22–32)
Calcium: 9.2 mg/dL (ref 8.9–10.3)
Chloride: 104 mmol/L (ref 98–111)
Creatinine, Ser: 0.75 mg/dL (ref 0.44–1.00)
GFR, Estimated: >60 mL/min (ref >60)
Glucose, Bld: 108 mg/dL — ABNORMAL HIGH (ref 70–99)
Potassium: 3.6 mmol/L (ref 3.5–5.1)
Sodium: 138 mmol/L (ref 135–145)

## 2024-01-23 LAB — RAPID URINE DRUG SCREEN, HOSP PERFORMED
Amphetamines: NOT DETECTED
Barbiturates: NOT DETECTED
Benzodiazepines: NOT DETECTED
Cocaine: NOT DETECTED
Opiates: NOT DETECTED
Tetrahydrocannabinol: NOT DETECTED

## 2024-01-23 MED ORDER — RISPERIDONE 0.5 MG PO TBDP
2.0000 mg | ORAL_TABLET | Freq: Once | ORAL | Status: AC
Start: 2024-01-23 — End: 2024-01-23
  Administered 2024-01-23: 2 mg via ORAL
  Filled 2024-01-23: qty 4

## 2024-01-23 MED ORDER — LORAZEPAM 1 MG PO TABS
2.0000 mg | ORAL_TABLET | Freq: Once | ORAL | Status: AC
  Administered 2024-01-23: 2 mg via ORAL
  Filled 2024-01-23: qty 2

## 2024-01-23 NOTE — ED Provider Notes (Signed)
  Physical Exam  BP 122/89 (BP Location: Right Arm)   Pulse 79   Temp 98.7 F (37.1 C) (Oral)   Resp 17   SpO2 100%   Physical Exam  Procedures  Procedures  ED Course / MDM    Medical Decision Making Amount and/or Complexity of Data Reviewed Labs: ordered.  Risk Prescription drug management.   Patient accepted by Dr. Johney Nageotte [facility] [place].  Reevaluated prior to transfer.       Mozell Arias, MD 01/23/24 1309

## 2024-01-23 NOTE — ED Notes (Addendum)
 Pt has been accepted to [facility] on 01/23/2024  Bed assignment: [facility]    Pt meets inpatient criteria per: [name]  [name]   Attending Physician will be: Dr. Johney Nageotte    Report can be called to: [phone]   Pt can arrive after ASAP    Care Team Notified: Lady Pier NP, Arta Lark RN   [name] LCSW-A    01/23/2024 12:00 PM
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
## 2024-01-23 NOTE — ED Notes (Signed)
 Safe Transport en route.

## 2024-01-23 NOTE — ED Notes (Signed)
 Pt refusing to get blood work taken until dr speaks with her. MD aware

## 2024-01-23 NOTE — ED Notes (Signed)
 Pt wanded by security.

## 2024-01-23 NOTE — ED Notes (Signed)
 Report called to Davielle @ [facility].

## 2024-01-23 NOTE — Progress Notes (Signed)
 LCSW Progress Note  [id number]   [name]  01/23/2024  11:50 AM  Description:   Inpatient Psychiatric Referral  Patient was recommended inpatient per [name]  [name] There are no available beds at [name], per [name] [name] Patient was referred to the following out of network facilities:   Destination  Service Provider Address Phone [name] [address] [name], [name]  [name] [address], [name]  [name] [address] [name], [name]      Situation ongoing, CSW to continue following and update chart as more information becomes available.      [name] [name], MSW, LCSW  01/23/2024 11:50 AM

## 2024-01-23 NOTE — Progress Notes (Signed)
 Pt has been accepted to [facility] on 01/23/2024  Bed assignment: [facility]   Pt meets inpatient criteria per: [name]  [name]  Attending Physician will be: Dr. Johney Nageotte   Report can be called to: [phone]  Pt can arrive after ASAP   Care Team Notified: Lady Pier NP, Cletus Dakins RN  [name] Rozeboom LCSW-A   01/23/2024 12:00 PM

## 2024-01-23 NOTE — BH Assessment (Signed)
 TTS consult will be completed by [facility]. [facility] Coordinator will communicate in established secure chat assessment time and provider name. Thanks

## 2024-01-23 NOTE — ED Notes (Signed)
 Pt belongings placed in room 29 locker. Belonings: grey jacket, pair of white shoes, black purse, purple lanyard with keys.

## 2024-07-26 ENCOUNTER — Encounter (HOSPITAL_COMMUNITY): Payer: Self-pay

## 2024-07-26 ENCOUNTER — Other Ambulatory Visit: Payer: Self-pay

## 2024-07-26 ENCOUNTER — Emergency Department (HOSPITAL_COMMUNITY)
Admission: EM | Admit: 2024-07-26 | Discharge: 2024-07-26 | Disposition: A | Discharge status: Home / Self Care | Attending: Emergency Medicine | Admitting: Emergency Medicine

## 2024-07-26 DIAGNOSIS — R0602 Shortness of breath: Secondary | ICD-10-CM | POA: Insufficient documentation

## 2024-07-26 DIAGNOSIS — R0981 Nasal congestion: Secondary | ICD-10-CM | POA: Insufficient documentation

## 2024-07-26 DIAGNOSIS — R059 Cough, unspecified: Secondary | ICD-10-CM | POA: Insufficient documentation

## 2024-07-26 LAB — RESP PANEL BY RT-PCR (RSV, FLU A&B, COVID)  RVPGX2
Influenza A by PCR: NEGATIVE
Influenza B by PCR: NEGATIVE
Resp Syncytial Virus by PCR: NEGATIVE
SARS Coronavirus 2 by RT PCR: NEGATIVE

## 2024-07-26 NOTE — ED Triage Notes (Signed)
 Pt BIB [facility] from home and EMS report pt questionably needs a psych eval. The report pt stating things like she is ShOB because she was watching church and the lord took her breath away, that she got COVID from sleeping with the neighbor, and that she could be under the influence of drugs or alcohol from people coming in at night and sticking it in her toes.   Pt stating to this RN that she had nasal congestion and ShOB that happened while watching TV. She used her inhalers and symptoms are now resolved. She denies cough, CP, fever, or chills. Pt does have pressured speech with loose tangential content.   EMS Vitals   146/94 HR 99 SpO2 99% CBG 121

## 2024-07-26 NOTE — Discharge Instructions (Signed)
 You can use your inhaler if you have any recurrent episodes of shortness of breath. Return to the ED at any time for recurrent or worsening symptoms.

## 2024-07-26 NOTE — ED Provider Notes (Signed)
 [place] EMERGENCY DEPARTMENT AT [facility] Provider Note   CSN: [id number] Arrival date & time: 07/26/24  2022     Patient presents with: Shortness of Breath (/)   [name] is a 53 y.o. female.   Patient to ED reporting an episode of shortness of breath tonight. She reports some nasal congestion. Denies fever but feels warm sometimes. No vomiting, no pain. She has a history of schizophrenic disorder, bipolar. EMS reports concern for psychiatric instability with reports she was offering information that did not make rational sense. When asked if she feels mentally unstable she states she does not feel she is.   The history is provided by the patient. No language interpreter was used.  Shortness of Breath      Prior to Admission medications   Medication Sig Start Date End Date Taking? Authorizing Provider  albuterol  (VENTOLIN  HFA) 108 (90 Base) MCG/ACT inhaler Inhale 2 puffs into the lungs 4 (four) times daily as needed for wheezing or shortness of breath.    [provider]  ascorbic acid  (VITAMIN C) 500 MG tablet Take 1 tablet (500 mg total) by mouth 2 (two) times daily. 05/10/22   Evelena Figures, MD  budesonide -formoterol  (SYMBICORT ) 160-4.5 MCG/ACT inhaler Inhale 2 puffs into the lungs daily. 10/22/22   Mesner, Selinda, MD  divalproex  (DEPAKOTE  ER) 500 MG 24 hr tablet Take 2 tablets (1,000 mg total) by mouth at bedtime. 05/10/22   Evelena Figures, MD  fluticasone  (FLONASE ) 50 MCG/ACT nasal spray Place 1 spray into both nostrils daily. 01/17/23   Smoot, Lauraine LABOR, PA-C  fluticasone  (FLONASE ) 50 MCG/ACT nasal spray Place 1 spray into both nostrils daily. 05/14/23   Schutt, Marsa HERO, PA-C  gabapentin (NEURONTIN) 600 MG tablet Take 600 mg by mouth 3 (three) times daily. 01/18/24   [provider]  ibuprofen  (ADVIL ) 800 MG tablet Take 1 tablet (800 mg total) by mouth every 8 (eight) hours as needed for moderate pain. 02/19/23   Suzette Pac, MD  metFORMIN   (GLUCOPHAGE ) 500 MG tablet Take 1 tablet (500 mg total) by mouth daily with breakfast. 09/15/22 10/15/22  Neldon Hamp RAMAN, PA  ondansetron  (ZOFRAN -ODT) 4 MG disintegrating tablet Take 1 tablet (4 mg total) by mouth every 8 (eight) hours as needed for nausea or vomiting. 09/15/22   Neldon Hamp RAMAN, PA  paliperidone  (INVEGA  SUSTENNA) 234 MG/1.5ML injection Inject 234 mg into the muscle every 28 (twenty-eight) days. 06/02/22   Evelena Figures, MD  paliperidone  (INVEGA ) 9 MG 24 hr tablet Take 9 mg by mouth daily. 01/18/24   [provider]  perphenazine (TRILAFON) 4 MG tablet Take 4 mg by mouth 2 (two) times daily. 01/18/24   [provider]  traZODone  (DESYREL ) 50 MG tablet Take 1 tablet (50 mg total) by mouth at bedtime as needed for sleep. 09/15/22   Neldon Hamp RAMAN, PA  ferrous sulfate  325 (65 FE) MG tablet Take 1 tablet (325 mg total) by mouth daily with breakfast. Patient not taking: No sig reported 12/24/18 04/20/19  Malinda Rogue, MD    Allergies: Milk-related compounds, Pollen extract, Porcine (pork) protein-containing drug products, Haldol [haloperidol decanoate], Penicillins, and Shrimp [shellfish allergy]    Review of Systems  Respiratory:  Positive for shortness of breath.     Updated Vital Signs BP 124/84   Pulse 81   Temp 98.1 F (36.7 C) (Oral)   Resp 16   Ht 5' 8 (1.727 m)   Wt 107 kg   SpO2 100%  BMI 35.88 kg/m   Physical Exam Vitals and nursing note reviewed.  Constitutional:      Appearance: She is well-developed. She is obese. She is not ill-appearing or toxic-appearing.  HENT:     Head: Normocephalic.     Mouth/Throat:     Mouth: Mucous membranes are moist.  Cardiovascular:     Rate and Rhythm: Normal rate and regular rhythm.  Pulmonary:     Effort: Pulmonary effort is normal.     Breath sounds: No wheezing, rhonchi or rales.  Abdominal:     Palpations: Abdomen is soft.  Musculoskeletal:        General: Normal range of motion.  Skin:     General: Skin is warm and dry.  Neurological:     Mental Status: She is alert and oriented to person, place, and time.     (all labs ordered are listed, but only abnormal results are displayed) Labs Reviewed  RESP PANEL BY RT-PCR (RSV, FLU A&B, COVID)  [id number]    EKG: EKG Interpretation Date/Time:  Saturday July 26 2024 20:36:31 EST Ventricular Rate:  86 PR Interval:  150 QRS Duration:  76 QT Interval:  366 QTC Calculation: 437 R Axis:   125  Text Interpretation: Normal sinus rhythm Low voltage QRS When compared with ECG of 22-Jan-2024 23:49, PREVIOUS ECG IS PRESENT Confirmed by Cottie Cough [id number]) on 07/26/2024 8:37:33 PM  Radiology: No results found.   Procedures   Medications Ordered in the ED - No data to display  Clinical Course as of 07/26/24 2350  Sat Jul 26, 2024  2203 Patient arrives for evaluation of shortness of breath, nasal congestion, without cough or fever. She feels better than she did earlier.   History of mental illness, but I do not feel she is acutely psychotic at this time. Viral panel ordered. Will observe for any display of psychosis.  [SU]  2345 Patient has remained calm and cooperative throughout her evaluation. I called her daughter, [name], and also spoke to her mother who feel she needs psychiatric evaluation, citing that thepatient cancelled an insurance policy without her mother's knowledge.   I re-questioned the patient. She continues to deny SI/HI. She denies audio or visual hallucinations. She does not want to stay for further evaluation by psychiatry and wishes to be discharged. She is not felt to meet criteria for involuntary evaluation. She states she would like to be discharged.  [SU]    Clinical Course User Index [SU] Odell Balls, PA-C                                 Medical Decision Making       Final diagnoses:  SOB (shortness of breath)    ED Discharge Orders     None          Odell Balls,  PA-C 07/26/24 2350    Cottie Cough PARAS, MD 07/27/24 [phone]

## 2024-09-11 ENCOUNTER — Other Ambulatory Visit: Payer: Self-pay

## 2024-09-11 ENCOUNTER — Emergency Department (HOSPITAL_COMMUNITY)
Admission: EM | Admit: 2024-09-11 | Discharge: 2024-09-12 | Disposition: A | Discharge status: Other Acute Inpatient Hospital | Attending: Emergency Medicine | Admitting: Emergency Medicine

## 2024-09-11 DIAGNOSIS — J45909 Unspecified asthma, uncomplicated: Secondary | ICD-10-CM | POA: Diagnosis not present

## 2024-09-11 DIAGNOSIS — F411 Generalized anxiety disorder: Secondary | ICD-10-CM | POA: Diagnosis not present

## 2024-09-11 DIAGNOSIS — F201 Disorganized schizophrenia: Secondary | ICD-10-CM

## 2024-09-11 DIAGNOSIS — I10 Essential (primary) hypertension: Secondary | ICD-10-CM | POA: Diagnosis not present

## 2024-09-11 DIAGNOSIS — Z87891 Personal history of nicotine dependence: Secondary | ICD-10-CM | POA: Insufficient documentation

## 2024-09-11 DIAGNOSIS — Z7951 Long term (current) use of inhaled steroids: Secondary | ICD-10-CM | POA: Insufficient documentation

## 2024-09-11 DIAGNOSIS — G4700 Insomnia, unspecified: Secondary | ICD-10-CM | POA: Diagnosis not present

## 2024-09-11 DIAGNOSIS — R7303 Prediabetes: Secondary | ICD-10-CM | POA: Diagnosis present

## 2024-09-11 DIAGNOSIS — Z91199 Patient's noncompliance with other medical treatment and regimen due to unspecified reason: Secondary | ICD-10-CM

## 2024-09-11 DIAGNOSIS — R4689 Other symptoms and signs involving appearance and behavior: Secondary | ICD-10-CM | POA: Diagnosis present

## 2024-09-11 DIAGNOSIS — F250 Schizoaffective disorder, bipolar type: Secondary | ICD-10-CM | POA: Diagnosis present

## 2024-09-11 DIAGNOSIS — F309 Manic episode, unspecified: Secondary | ICD-10-CM | POA: Diagnosis not present

## 2024-09-11 DIAGNOSIS — F259 Schizoaffective disorder, unspecified: Secondary | ICD-10-CM | POA: Diagnosis present

## 2024-09-11 LAB — CBC
HCT: 40.5 % (ref 36.0–46.0)
Hemoglobin: 13.4 g/dL (ref 12.0–15.0)
MCH: 29.6 pg (ref 26.0–34.0)
MCHC: 33.1 g/dL (ref 30.0–36.0)
MCV: 89.4 fL (ref 80.0–100.0)
Platelets: 302 K/uL (ref 150–400)
RBC: 4.53 MIL/uL (ref 3.87–5.11)
RDW: 13.1 % (ref 11.5–15.5)
WBC: 4.9 K/uL (ref 4.0–10.5)
nRBC: 0 % (ref 0.0–0.2)

## 2024-09-11 LAB — ETHANOL: Alcohol, Ethyl (B): <15 mg/dL

## 2024-09-11 LAB — URINE DRUG SCREEN
Amphetamines: NEGATIVE
Barbiturates: NEGATIVE
Benzodiazepines: NEGATIVE
Cocaine: NEGATIVE
Fentanyl: NEGATIVE
Methadone Scn, Ur: NEGATIVE
Opiates: NEGATIVE
Tetrahydrocannabinol: NEGATIVE

## 2024-09-11 LAB — COMPREHENSIVE METABOLIC PANEL WITH GFR
ALT: 14 U/L (ref 0–44)
AST: 20 U/L (ref 15–41)
Albumin: 4.6 g/dL (ref 3.5–5.0)
Alkaline Phosphatase: 82 U/L (ref 38–126)
Anion gap: 13 (ref 5–15)
BUN: 10 mg/dL (ref 6–20)
CO2: 22 mmol/L (ref 22–32)
Calcium: 9.9 mg/dL (ref 8.9–10.3)
Chloride: 108 mmol/L (ref 98–111)
Creatinine, Ser: 0.77 mg/dL (ref 0.44–1.00)
GFR, Estimated: >60 mL/min
Glucose, Bld: 106 mg/dL — ABNORMAL HIGH (ref 70–99)
Potassium: 3.8 mmol/L (ref 3.5–5.1)
Sodium: 143 mmol/L (ref 135–145)
Total Bilirubin: 0.4 mg/dL (ref 0.0–1.2)
Total Protein: 8.3 g/dL — ABNORMAL HIGH (ref 6.5–8.1)

## 2024-09-11 LAB — SALICYLATE LEVEL: Salicylate Lvl: <7 mg/dL — ABNORMAL LOW (ref 7.0–30.0)

## 2024-09-11 LAB — ACETAMINOPHEN LEVEL: Acetaminophen (Tylenol), Serum: <10 ug/mL — ABNORMAL LOW (ref 10–30)

## 2024-09-11 MED ORDER — ZIPRASIDONE MESYLATE 20 MG IM SOLR: 20.0000 mg | INTRAMUSCULAR | Status: DC | PRN

## 2024-09-11 MED ORDER — ZIPRASIDONE MESYLATE 20 MG IM SOLR: 20.0000 mg | Freq: Two times a day (BID) | INTRAMUSCULAR | Status: DC | PRN

## 2024-09-11 MED ORDER — ACETAMINOPHEN 325 MG PO TABS: 650.0000 mg | ORAL_TABLET | ORAL | Status: DC | PRN

## 2024-09-11 MED ORDER — PALIPERIDONE ER 3 MG PO TB24
3.0000 mg | ORAL_TABLET | Freq: Two times a day (BID) | ORAL | Status: DC
  Administered 2024-09-11 – 2024-09-12 (×2): 3 mg via ORAL
  Filled 2024-09-11 (×3): qty 1

## 2024-09-11 MED ORDER — PALIPERIDONE ER 6 MG PO TB24: 9.0000 mg | ORAL_TABLET | Freq: Every day | ORAL | Status: DC

## 2024-09-11 MED ORDER — LORAZEPAM 1 MG PO TABS: 1.0000 mg | ORAL_TABLET | ORAL | Status: DC | PRN

## 2024-09-11 MED ORDER — ALBUTEROL SULFATE HFA 108 (90 BASE) MCG/ACT IN AERS: 2.0000 | INHALATION_SPRAY | Freq: Four times a day (QID) | RESPIRATORY_TRACT | Status: DC | PRN

## 2024-09-11 MED ORDER — ALUM & MAG HYDROXIDE-SIMETH 200-200-20 MG/5ML PO SUSP: 30.0000 mL | Freq: Four times a day (QID) | ORAL | Status: DC | PRN

## 2024-09-11 MED ORDER — NICOTINE 21 MG/24HR TD PT24: 21.0000 mg | MEDICATED_PATCH | Freq: Every day | TRANSDERMAL | Status: DC

## 2024-09-11 MED ORDER — DIVALPROEX SODIUM ER 500 MG PO TB24: 1000.0000 mg | ORAL_TABLET | Freq: Every day | ORAL | Status: DC

## 2024-09-11 MED ORDER — TRAZODONE HCL 100 MG PO TABS
50.0000 mg | ORAL_TABLET | Freq: Every day | ORAL | Status: DC
  Administered 2024-09-11: 50 mg via ORAL
  Filled 2024-09-11: qty 1

## 2024-09-11 MED ORDER — ZOLPIDEM TARTRATE 5 MG PO TABS: 5.0000 mg | ORAL_TABLET | Freq: Every evening | ORAL | Status: DC | PRN

## 2024-09-11 MED ORDER — RISPERIDONE 0.5 MG PO TBDP: 1.0000 mg | ORAL_TABLET | Freq: Two times a day (BID) | ORAL | Status: DC | PRN

## 2024-09-11 MED ORDER — PERPHENAZINE 4 MG PO TABS: 4.0000 mg | ORAL_TABLET | Freq: Two times a day (BID) | ORAL | Status: DC

## 2024-09-11 MED ORDER — RISPERIDONE 0.5 MG PO TBDP
2.0000 mg | ORAL_TABLET | Freq: Three times a day (TID) | ORAL | Status: DC | PRN
  Administered 2024-09-11: 2 mg via ORAL
  Filled 2024-09-11: qty 4

## 2024-09-11 MED ORDER — ONDANSETRON HCL 4 MG PO TABS: 4.0000 mg | ORAL_TABLET | Freq: Three times a day (TID) | ORAL | Status: DC | PRN

## 2024-09-11 MED ORDER — GABAPENTIN 600 MG PO TABS: 600.0000 mg | ORAL_TABLET | Freq: Three times a day (TID) | ORAL | Status: DC

## 2024-09-11 NOTE — ED Triage Notes (Signed)
 Pt IVC by ACT team, picked up at residence. Team reports pt is refusing meds. Pt states she 'takes them like the doctor prescribes'. Pt is talking pretty much nonstop but not pressured. Calm and cooperative at this time. Denies drugs/etoh. Denies AVH, SI/HI.

## 2024-09-11 NOTE — ED Provider Notes (Signed)
 " [place] EMERGENCY DEPARTMENT AT [facility] Provider Note   CSN: [id number] Arrival date & time: 09/11/24  1313     Patient presents with: No chief complaint on file.   [name] is a 54 y.o. female.   The history is provided by the patient, the police and medical records. No language interpreter was used.     54 year old female with history of bipolar disorder, schizophrenia, seizures, anxiety, depression, brought here by [employer] with IVC paper from ACT team for mental illness.  Team report pt is refusing to take her medications.  Further more, IVC paperwork also indicated patient is displaying disorganization behavior, unable to carry a  coherent conversation, have been hoarding items at home making it inhabitable.  It was noted that her home was full of trash and pest infestation.  Patient at this time is without any specific complaints however history is difficult to obtain as a speech is not goal-directed.  Prior to Admission medications  Medication Sig Start Date End Date Taking? Authorizing Provider  albuterol  (VENTOLIN  HFA) 108 (90 Base) MCG/ACT inhaler Inhale 2 puffs into the lungs 4 (four) times daily as needed for wheezing or shortness of breath.    [provider]  ascorbic acid  (VITAMIN C) 500 MG tablet Take 1 tablet (500 mg total) by mouth 2 (two) times daily. 05/10/22   Evelena Figures, MD  budesonide -formoterol  (SYMBICORT ) 160-4.5 MCG/ACT inhaler Inhale 2 puffs into the lungs daily. 10/22/22   Mesner, Selinda, MD  divalproex  (DEPAKOTE  ER) 500 MG 24 hr tablet Take 2 tablets (1,000 mg total) by mouth at bedtime. 05/10/22   Evelena Figures, MD  fluticasone  (FLONASE ) 50 MCG/ACT nasal spray Place 1 spray into both nostrils daily. 01/17/23   Smoot, Lauraine LABOR, PA-C  fluticasone  (FLONASE ) 50 MCG/ACT nasal spray Place 1 spray into both nostrils daily. 05/14/23   Schutt, Marsa HERO, PA-C  gabapentin  (NEURONTIN ) 600 MG tablet Take 600 mg by mouth 3 (three) times daily.  01/18/24   [provider]  ibuprofen  (ADVIL ) 800 MG tablet Take 1 tablet (800 mg total) by mouth every 8 (eight) hours as needed for moderate pain. 02/19/23   Zammit, Joseph, MD  metFORMIN  (GLUCOPHAGE ) 500 MG tablet Take 1 tablet (500 mg total) by mouth daily with breakfast. 09/15/22 10/15/22  Neldon Hamp RAMAN, PA  ondansetron  (ZOFRAN -ODT) 4 MG disintegrating tablet Take 1 tablet (4 mg total) by mouth every 8 (eight) hours as needed for nausea or vomiting. 09/15/22   Neldon Hamp RAMAN, PA  paliperidone  (INVEGA  SUSTENNA) 234 MG/1.5ML injection Inject 234 mg into the muscle every 28 (twenty-eight) days. 06/02/22   Evelena Figures, MD  paliperidone  (INVEGA ) 9 MG 24 hr tablet Take 9 mg by mouth daily. 01/18/24   [provider]  perphenazine  (TRILAFON ) 4 MG tablet Take 4 mg by mouth 2 (two) times daily. 01/18/24   [provider]  traZODone  (DESYREL ) 50 MG tablet Take 1 tablet (50 mg total) by mouth at bedtime as needed for sleep. 09/15/22   Neldon Hamp RAMAN, PA  ferrous sulfate  325 (65 FE) MG tablet Take 1 tablet (325 mg total) by mouth daily with breakfast. Patient not taking: No sig reported 12/24/18 04/20/19  Malinda Rogue, MD    Allergies: Milk-related compounds, Pollen extract, Porcine (pork) protein-containing drug products, Haldol [haloperidol decanoate], Penicillins, and Shrimp [shellfish allergy]    Review of Systems  Unable to perform ROS: Psychiatric disorder    Updated Vital Signs BP (!) 140/100 (BP Location: Left Arm)  Pulse 97   Temp 97.6 F (36.4 C) (Oral)   Resp 20   SpO2 99%   Physical Exam Vitals and nursing note reviewed.  Constitutional:      General: She is not in acute distress.    Appearance: She is well-developed.     Comments: Patient is sitting in the bed, talking to self but at this time not in any acute discomfort.  HENT:     Head: Atraumatic.  Eyes:     Conjunctiva/sclera: Conjunctivae normal.  Cardiovascular:     Rate and Rhythm: Normal  rate and regular rhythm.     Pulses: Normal pulses.     Heart sounds: Normal heart sounds.  Pulmonary:     Effort: Pulmonary effort is normal.  Abdominal:     Palpations: Abdomen is soft.  Musculoskeletal:     Cervical back: Neck supple.  Skin:    Findings: No rash.  Neurological:     Mental Status: She is alert.     Comments: Alert and oriented x 2  Psychiatric:        Mood and Affect: Mood normal. Affect is labile.        Speech: Speech is tangential.        Behavior: Behavior is cooperative.        Thought Content: Thought content is delusional. Thought content does not include homicidal or suicidal ideation.     (all labs ordered are listed, but only abnormal results are displayed) Labs Reviewed  CBC  COMPREHENSIVE METABOLIC PANEL WITH GFR  ETHANOL  URINE DRUG SCREEN  ACETAMINOPHEN  LEVEL  SALICYLATE LEVEL    EKG: None  Radiology: No results found.   Procedures   Medications Ordered in the ED  risperiDONE  (RISPERDAL  M-TABS) disintegrating tablet 2 mg (2 mg Oral Given 09/11/24 1403)    And  LORazepam  (ATIVAN ) tablet 1 mg (has no administration in time range)    And  ziprasidone  (GEODON ) injection 20 mg (has no administration in time range)  acetaminophen  (TYLENOL ) tablet 650 mg (has no administration in time range)  zolpidem  (AMBIEN ) tablet 5 mg (has no administration in time range)  ondansetron  (ZOFRAN ) tablet 4 mg (has no administration in time range)  alum & mag hydroxide-simeth (MAALOX/MYLANTA) 200-200-20 MG/5ML suspension 30 mL (has no administration in time range)  nicotine  (NICODERM CQ  - dosed in mg/24 hours) patch 21 mg (has no administration in time range)  albuterol  (VENTOLIN  HFA) 108 (90 Base) MCG/ACT inhaler 2 puff (has no administration in time range)  divalproex  (DEPAKOTE  ER) 24 hr tablet 1,000 mg (has no administration in time range)  gabapentin  (NEURONTIN ) tablet 600 mg (has no administration in time range)  paliperidone  (INVEGA ) 24 hr tablet 9  mg (has no administration in time range)  perphenazine  (TRILAFON ) tablet 4 mg (has no administration in time range)                                    Medical Decision Making Amount and/or Complexity of Data Reviewed Labs: ordered.  Risk OTC drugs. Prescription drug management.   BP (!) 140/100 (BP Location: Left Arm)   Pulse 97   Temp 97.6 F (36.4 C) (Oral)   Resp 20   SpO2 99%   2:73 PM  54 year old female with history of bipolar disorder, schizophrenia, seizures, anxiety, depression, brought here by [employer] with IVC paper from ACT team for mental illness.  Team report pt  is refusing to take her medications.  Further more, IVC paperwork also indicated patient is displaying disorganization behavior, unable to carry a  coherent conversation, have been hoarding items at home making it inhabitable.  It was noted that her home was full of trash and pest infestation.  Patient at this time is without any specific complaints however history is difficult to obtain as a speech is not goal-directed.  On exam, patient is talking to self, she is able to answer question but speech at times pressured and not goal oriented.  Heart with normal rate rhythm, lungs are clear abdomen soft nontender  IVC paper and first exam-filed.  Will perform medical screening exam and will consult psychiatry team for further management.  -Labs ordered, independently viewed and interpreted by me.  Labs remarkable for pending lab values -The patient was maintained on a cardiac monitor.  I personally viewed and interpreted the cardiac monitored which showed an underlying rhythm of: SR -This patient presents to the ED for concern of IVC, this involves an extensive number of treatment options, and is a complaint that carries with it a high risk of complications and morbidity.  The differential diagnosis includes psychosis, mania, drug induce mood disorder, depression, metabolic derangement -Co morbidities that complicate  the patient evaluation includes bipolar -Treatment includes psych meds -Reevaluation of the patient after these medicines showed that the patient stayed the same -PCP office notes or outside notes reviewed -Discussion with specialist including TTS and psych for further psych management -Escalation to admission/observation considered: dispo pending psych assessment. IVC paper and First Exam done.       Final diagnoses:  [facility])    ED Discharge Orders     None          Nivia Colon, PA-C 09/11/24 1520    Armenta Canning, MD 09/15/24 1703  "

## 2024-09-11 NOTE — Consult Note (Cosign Needed Addendum)
 [facility] Psychiatric Consult Initial  Patient Name: .[name]  MRN: [id number]  DOB: 09/07/1970  Consult Order details:  Orders (From admission, onward)     Start     Ordered   09/11/24 1353  CONSULT TO CALL ACT TEAM       Ordering Provider: Nivia Colon, PA-C  Provider:  (Not yet assigned)  Question:  Reason for Consult?  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  GAD 3.  Insomnia  Assessment  HPI: [name] is a 54 y.o. female. She Presented to the ED on 09/11/2024  1:20 PM for psychosis. She carries the psychiatric diagnoses of schizophrenia, GAD, bipolar 1 disorder, and MDD, and a medical diagnoses of seizure disorder, asthma, and is prediabetic.  Patient was brought in under an involuntary petition filed by her assertive community treatment team, who picked her up at her residence and brought her to the hospital, due to her mental status continued to decline in the context of medication noncompliance. Per initial documentation here in the ER: brought here by [employer] with IVC paper from ACT team for mental illness.  Team report pt is refusing to take her medications.  Further more, IVC paperwork also indicated patient is displaying disorganization behavior, unable to carry a  coherent conversation, have been hoarding items at home making it inhabitable.  It was noted that her home was full of trash and pest infestation. Kelley Colon RIGGERS, Date of Service: 09/11/2024  1:46 PM ).  Assessment and review of psychiatry symptoms: During encounter with patient, she is calm, and cooperative, but is tangential, presents with speech pressured, rapid, and is hyperverbal, and speech is illogical, incoherent, irrational, and nonlinear in nature; patient  presents with flight of ideas, is asked reason for presentation today, and begins talking about how she was watching church, and the [place] took her breath away, she talks about how she was born in [place] as a 6.8 pound baby, and her mother brought her to the [place] , and she needed to be fed via tubes, etc. patient is difficult to interrupt, for more questions as she goes on a tangent with various other topics.  She is asked what medications she takes, and states I have [employer], I have [employer]. I was put in the hospital for 21 years, was given some glucose, Josefa Maize was a white Lady who can testify that I was born blind., etc. Patient however denies suicidal ideation, denies homicidal ideation, denies auditory or visual hallucinations.   Pt is however presents with paranoia & delusions of persecution, feels like her assertive community treatment team members are out to harm her by giving her sugar pills which will kill me. It was some glucose they made up. She states that they made the Invega  injections themselves and this is why she cannot take it.  As per chart review, patient is also prescribed gabapentin  600 mg 3 times daily, but she states that she has not taken medication in a while now.  She reports living by herself, and when asked if she works, she nods her head and affirmation, and states that she works for [employer], while pointing to the lanyard that she has  on her neck, stating that she people, to help them out as a job.  Patient is asked other questions related to other review of psychiatry systems, but answers are in an illogical manner, and mental status is to impaired at this moment to accurately assess.  Patient is asked if she has been sleeping well, enjoying things that make her happy, feeling guilty, amongst other symptoms related to depressive symptoms, and anxiety, but again, goes on a tangent with unrelated ideas.  She is very difficult to follow, denies any history of a  suicide attempt, but admits to being hospitalized several times in the past.  Chart review however does not reflect this.  Home medications: Listed in chart review, add Caplyta 42 mg capsules, as well as Depakote  500 mg nightly at bedtime, metformin  500 mg daily with breakfast, trazodone  50 mg nightly as needed for sleep, and an albuterol  inhaler, with vitamin C.  Plan   ## Psychiatric Medication Recommendations:  -It is unknown for how long patient has been refusing her Invega , will order Invega  3 mg twice daily, with a goal of restarting her LAI - Trazodone  50 mg nightly for sleep, - Hydroxyzine  25 mg as needed 3 times daily for anxiety -Discontinuing Ambien  5 mg nightly as can worsen psychosis -Continuing risperidone  2 mg IM PRN/Geodon  20 mg PRN for agitation  ## Medical Decision Making Capacity: Patient is her own guardian  ## Further Work-up:  -Ordering TSH, lipid panel, hemoglobin A1c, in the context of psychotropic medication use. Ordering B12, Vit D to ascertain if these are contributory factors in depression. Ordering psychosis labs: RPR, HIV, urinalysis, GC chlamydia, to determine if psychosis is of medical etiology.  ## Disposition:-- We recommend inpatient psychiatric hospitalization when medically cleared. Patient is under voluntary admission status at this time; please IVC if attempts to leave hospital.  ## Behavioral / Environmental: -Patient would benefit from more frequent contact with medical team to delineate plan of care and allow for clarification questions, which will help alleviate anxiety regarding treatment. If possible, try to check back in with the pt in the afternoon.    ## Safety and Observation Level:  - Based on my clinical evaluation, I estimate the patient to be at low risk of self harm in the current setting. - At this time, we recommend  routine. This decision is based on my review of the chart including patient's history and current presentation, interview  of the patient, mental status examination, and consideration of suicide risk including evaluating suicidal ideation, plan, intent, suicidal or self-harm behaviors, risk factors, and protective factors. This judgment is based on our ability to directly address suicide risk, implement suicide prevention strategies, and develop a safety plan while the patient is in the clinical setting. Please contact our team if there is a concern that risk level has changed.  CSSR Risk Category:C-SSRS RISK CATEGORY: No Risk  Suicide Risk Assessment: Patient has following modifiable risk factors for suicide: active mental illness (to encompass adhd, tbi, mania, psychosis, trauma reaction), which we are addressing by ordering medications and making appropriate recommendations for a higher level of care. Patient has following non-modifiable or demographic risk factors for suicide: psychiatric hospitalization Patient has the following protective factors against suicide: Access to outpatient mental health care  Thank you for this consult request. Recommendations have been communicated to the primary team.  We will continue to follow at this time.   Donia Snell, NP      Psychiatric and Social History  Social History   Socioeconomic History   Marital status: Single    Spouse name: Not on file   Number of children: Not on file   Years of education: Not on file   Highest education level: Not on file  Occupational History   Not on file  Tobacco Use   Smoking status: Former    Types: Cigarettes   Smokeless tobacco: Never  Vaping Use   Vaping status: Never Used  Substance and Sexual Activity   Alcohol use: Not Currently   Drug use: No   Sexual activity: Not Currently  Other Topics Concern   Not on file  Social History Narrative   Not on file   Social Drivers of Health   Tobacco Use: Medium Risk (07/26/2024)   Patient History    Smoking Tobacco Use: Former    Smokeless Tobacco Use: Never     Passive Exposure: Not on Actuary Strain: Not on file  Food Insecurity: Not on file  Transportation Needs: Not on file  Physical Activity: Not on file  Stress: Not on file  Social Connections: Not on file  Intimate Partner Violence: Not on file  Depression (EYV7-0): Not on file  Alcohol Screen: Low Risk (04/26/2022)   Alcohol Screen    Last Alcohol Screening Score (AUDIT): 0  Housing: Not on file  Utilities: Not on file  Health Literacy: Not on file    Access to weapons/lethal means: denies    Substance History Denies   Exam Findings  Physical Exam:  Vital Signs:  Temp:  [97.6 F (36.4 C)] 97.6 F (36.4 C) (01/15 1338) Pulse Rate:  [97] 97 (01/15 1338) Resp:  [20] 20 (01/15 1338) BP: (140)/(100) 140/100 (01/15 1338) SpO2:  [99 %] 99 % (01/15 1338) Blood pressure (!) 140/100, pulse 97, temperature 97.6 F (36.4 C), temperature source Oral, resp. rate 20, SpO2 99%. There is no height or weight on file to calculate BMI.  Physical Exam  Mental Status Exam: General Appearance: Casual  Orientation:  Full (Time, Place, and Person)  Memory:  Recent;   Fair  Concentration:  Attention Span: Fair  Recall:  Fair  Attention  Fair  Eye Contact:  Fair  Speech:  Pressured  Language:  Fair  Volume:  Increased  Mood: dysphoric  Affect:  Appropriate  Thought Process:  Disorganized  Thought Content:  Illogical  Suicidal Thoughts:  No  Homicidal Thoughts:  No  Judgement:  Impaired  Insight:  Shallow  Psychomotor Activity:  Normal  Akathisia:  No  Fund of Knowledge:  Poor      Assets:  Resilience  Cognition:  Impaired,  Severe  ADL's:  Impaired  AIMS (if indicated):        Other History   These have been pulled in through the EMR, reviewed, and updated if appropriate.  Family History:  The patient's family history includes Alcoholism in an other family member; Anxiety disorder in her son; Cancer in her father; Depression in her mother and son; Gout in her  mother; Heart attack in her maternal grandfather, maternal grandmother, paternal grandfather, and paternal grandmother; Other in her father.  Medical History: Past Medical History:  Diagnosis Date   Anxiety    Asthma    Bipolar 1 disorder (HCC)    Depression    Gallstones 08/2020   Hypertension    Insomnia, persistent    Prediabetes    Schizophrenic disorder (HCC)    Seizures (HCC)     Surgical History:  Past Surgical History:  Procedure Laterality Date   BREAST LUMPECTOMY Left 07/2013   TONSILLECTOMY     TUBAL LIGATION  2002     Medications:  Current Medications[1]  Allergies: Allergies[2]  Donia Snell, NP     [1]  Current Facility-Administered Medications:    acetaminophen  (TYLENOL ) tablet 650 mg, 650 mg, Oral, Q4H PRN, Tran, Bowie, PA-C   albuterol  (VENTOLIN  HFA) 108 (90 Base) MCG/ACT inhaler 2 puff, 2 puff, Inhalation, QID PRN, Tran, Bowie, PA-C   alum & mag hydroxide-simeth (MAALOX/MYLANTA) 200-200-20 MG/5ML suspension 30 mL, 30 mL, Oral, Q6H PRN, Tran, Bowie, PA-C   nicotine  (NICODERM CQ  - dosed in mg/24 hours) patch 21 mg, 21 mg, Transdermal, Daily, Tran, Bowie, PA-C   ondansetron  (ZOFRAN ) tablet 4 mg, 4 mg, Oral, Q8H PRN, Tran, Bowie, PA-C   paliperidone  (INVEGA ) 24 hr tablet 3 mg, 3 mg, Oral, BID, Anani Gu, NP   risperiDONE  (RISPERDAL  M-TABS) disintegrating tablet 1 mg, 1 mg, Oral, BID PRN **OR** ziprasidone  (GEODON ) injection 20 mg, 20 mg, Intramuscular, BID PRN, Syrenity Klepacki, NP   traZODone  (DESYREL ) tablet 50 mg, 50 mg, Oral, QHS, Tiffney Haughton, NP  Current Outpatient Medications:    albuterol  (VENTOLIN  HFA) 108 (90 Base) MCG/ACT inhaler, Inhale 1-2 puffs into the lungs every 6 (six) hours as needed for wheezing or shortness of breath., Disp: , Rfl:    ascorbic acid  (VITAMIN C) 500 MG tablet, Take 1 tablet (500 mg total) by mouth 2 (two) times daily. (Patient not taking: Reported on 09/11/2024), Disp: 60 tablet, Rfl: 0   budesonide -formoterol   (SYMBICORT ) 160-4.5 MCG/ACT inhaler, Inhale 2 puffs into the lungs daily. (Patient not taking: Reported on 09/11/2024), Disp: 1 each, Rfl: 12   CAPLYTA 42 MG capsule, Take 42 mg by mouth daily., Disp: , Rfl:    divalproex  (DEPAKOTE  ER) 500 MG 24 hr tablet, Take 2 tablets (1,000 mg total) by mouth at bedtime. (Patient not taking: Reported on 09/11/2024), Disp: 60 tablet, Rfl: 0   fluticasone  (FLONASE ) 50 MCG/ACT nasal spray, Place 1 spray into both nostrils daily. (Patient not taking: Reported on 09/11/2024), Disp: 18.2 mL, Rfl: 2   fluticasone  (FLONASE ) 50 MCG/ACT nasal spray, Place 1 spray into both nostrils daily. (Patient not taking: Reported on 09/11/2024), Disp: 11.1 mL, Rfl: 0   ibuprofen  (ADVIL ) 800 MG tablet, Take 1 tablet (800 mg total) by mouth every 8 (eight) hours as needed for moderate pain. (Patient not taking: Reported on 09/11/2024), Disp: 21 tablet, Rfl: 0   metFORMIN  (GLUCOPHAGE ) 500 MG tablet, Take 1 tablet (500 mg total) by mouth daily with breakfast. (Patient not taking: Reported on 09/11/2024), Disp: 30 tablet, Rfl: 0   ondansetron  (ZOFRAN -ODT) 4 MG disintegrating tablet, Take 1 tablet (4 mg total) by mouth every 8 (eight) hours as needed for nausea or vomiting. (Patient not taking: Reported on 09/11/2024), Disp: 20 tablet, Rfl: 0   paliperidone  (INVEGA  SUSTENNA) 234 MG/1.5ML injection, Inject 234 mg into the muscle every 28 (twenty-eight) days. (Patient not taking: Reported on 09/11/2024), Disp: 1.5 mL, Rfl: 0   traZODone  (DESYREL ) 50 MG tablet, Take 1 tablet (50 mg total) by mouth at bedtime as needed for sleep. (Patient not taking: Reported on 09/11/2024), Disp: 30 tablet, Rfl: 0 [2]  Allergies Allergen Reactions   Milk-Related Compounds Nausea And Vomiting   Pollen Extract Other (See Comments)    Seasonal allergies   Porcine (Pork) Protein-Containing Drug Products Nausea Only   Haldol [Haloperidol Decanoate] Swelling, Anxiety and Other (See Comments)  Stiffness, eyes bulging- too    Penicillins Nausea And Vomiting, Swelling and Rash   Shrimp [Shellfish Allergy] Nausea Only, Rash and Other (See Comments)    Raw shrimp

## 2024-09-11 NOTE — ED Notes (Signed)
 I asked the patient to dress out and she started yelling and cursing. She refuses to change her clothes.

## 2024-09-11 NOTE — Progress Notes (Signed)
 Pt has been accepted to [facility] on 09/11/2024 . Bed assignment: TBD   Pt meets inpatient criteria per Donia Snell, NP   Attending Physician will be Dr. Raliegh    Report can be called to: - Adult unit: [phone]  Pt can arrive pending discharges in the AM and medical clearance   Care Team Notified: [facility]  Luke Sprang, [place], RN

## 2024-09-12 ENCOUNTER — Inpatient Hospital Stay (HOSPITAL_COMMUNITY): Admission: AD | Admit: 2024-09-12 | Discharge: 2024-10-03 | Disposition: A | Source: Intra-hospital

## 2024-09-12 ENCOUNTER — Other Ambulatory Visit: Payer: Self-pay

## 2024-09-12 ENCOUNTER — Encounter (HOSPITAL_COMMUNITY): Payer: Self-pay | Admitting: Nurse Practitioner

## 2024-09-12 DIAGNOSIS — F201 Disorganized schizophrenia: Principal | ICD-10-CM | POA: Insufficient documentation

## 2024-09-12 DIAGNOSIS — F250 Schizoaffective disorder, bipolar type: Principal | ICD-10-CM | POA: Diagnosis present

## 2024-09-12 MED ORDER — HYDROXYZINE HCL 25 MG PO TABS
25.0000 mg | ORAL_TABLET | Freq: Three times a day (TID) | ORAL | Status: DC | PRN
  Administered 2024-09-12 – 2024-09-16 (×6): 25 mg via ORAL
  Filled 2024-09-12 (×6): qty 1

## 2024-09-12 MED ORDER — TRAZODONE HCL 50 MG PO TABS
50.0000 mg | ORAL_TABLET | Freq: Every evening | ORAL | Status: DC | PRN
  Administered 2024-09-12 – 2024-09-16 (×5): 50 mg via ORAL
  Filled 2024-09-12 (×5): qty 1

## 2024-09-12 MED ORDER — LORAZEPAM 2 MG/ML IJ SOLN: 1.0000 mg | Freq: Every day | INTRAMUSCULAR | Status: DC | PRN

## 2024-09-12 MED ORDER — OLANZAPINE 5 MG PO TBDP
5.0000 mg | ORAL_TABLET | Freq: Three times a day (TID) | ORAL | Status: DC | PRN
  Administered 2024-09-12: 5 mg via ORAL
  Filled 2024-09-12 (×2): qty 1

## 2024-09-12 MED ORDER — LORAZEPAM 1 MG PO TABS: 1.0000 mg | ORAL_TABLET | Freq: Every day | ORAL | Status: DC | PRN

## 2024-09-12 NOTE — Group Note (Unsigned)
 Date:  09/12/2024 Time:  8:18 PM  Group Topic/Focus:  Wrap-Up Group:   The focus of this group is to help patients review their daily goal of treatment and discuss progress on daily workbooks.     Participation Level:  {BHH PARTICIPATION OZCZO:77735}  Participation Quality:  {BHH PARTICIPATION QUALITY:22265}  Affect:  {BHH AFFECT:22266}  Cognitive:  {BHH COGNITIVE:22267}  Insight: {BHH Insight2:20797}  Engagement in Group:  {BHH ENGAGEMENT IN HMNLE:77731}  Modes of Intervention:  {BHH MODES OF INTERVENTION:22269}  Additional Comments:  ***  [name] 09/12/2024, 8:18 PM

## 2024-09-12 NOTE — ED Notes (Signed)
 Sitting on bed, watching department, talking to self.

## 2024-09-12 NOTE — Progress Notes (Signed)
(  Sleep Hours) -7  (Any PRNs that were needed, meds refused, or side effects to meds)- Zyprexa  per [facility]  (Any disturbances and when (visitation, over night)-none  (Concerns raised by the patient)- pt disorganized, religiously preoccupied , pt malodorous , pt encouraged to take a shower, pt said she went in the bathroom and took a shower, but there was no evidence of this. Pt remembered writer from previous admissions. Pt paranoid that we may have her confused with another patient , pt reassured that we have the right person.   (SI/HI/AVH)-AH

## 2024-09-12 NOTE — Progress Notes (Signed)
 Pt has been accepted to [facility] on 09/12/2024 Bed assignment: 506-01  Pt meets inpatient criteria per: Donia Snell NP   Attending Physician will be: Dr. Raliegh MD   Report can be called to: Adult unit: [phone]  Pt can arrive after discharges   Care Team Notified: [facility]  Danika Carlo RN,   [place] Bonny Egger LCSW  09/12/2024 9:43 AM

## 2024-09-12 NOTE — Tx Team (Signed)
 Initial Treatment Plan 09/12/2024 3:18 PM [name] FMW:[id number]    PATIENT STRESSORS: Health problems   Medication change or noncompliance     PATIENT STRENGTHS: Ability for insight  Communication skills    PATIENT IDENTIFIED PROBLEMS: To work on my family relationship  Paranoia  Audiovisual hallucinations  Medication noncompliant  Ineffective coping skills             DISCHARGE CRITERIA:  Ability to meet basic life and health needs Adequate post-discharge living arrangements  PRELIMINARY DISCHARGE PLAN: Attend aftercare/continuing care group Outpatient therapy Return to previous living arrangement  PATIENT/FAMILY INVOLVEMENT: This treatment plan has been presented to and reviewed with the patient, [name], and/or family member.  The patient and family have been given the opportunity to ask questions and make suggestions.  Yvette MALVA Lowers, RN 09/12/2024, 3:18 PM

## 2024-09-12 NOTE — ED Notes (Signed)
 Attempt made to call report to the receiving RN. Call was transferred, but receiving RN did not pick up. Will try again.

## 2024-09-12 NOTE — ED Notes (Addendum)
 Moving pt towards dressing out in scrubs, security and NT present. Pt reluctantly cooperative, needing much coaching. Talking to self, appears to be responding to internal stimuli.

## 2024-09-12 NOTE — ED Provider Notes (Signed)
 Emergency Medicine Observation Re-evaluation Note  [name] is a 54 y.o. female, seen on rounds today.  Pt initially presented to the ED for complaints of No chief complaint on file. Currently, the patient is resting comfortably.  Patient has history of schizoaffective disorder and comes in with psychosis. IVC was completed by ACT team.  Physical Exam  BP 105/82 (BP Location: Right Arm)   Pulse 85   Temp 97.9 F (36.6 C)   Resp 17   SpO2 100%  Physical Exam General: No acute distress  ED Course / MDM  EKG:   I have reviewed the labs performed to date as well as medications administered while in observation.  Recent changes in the last 24 hours include no acute findings, psychiatry recommends admission for stabilization.  Plan  Current plan is for holding patient for psych stabilization.    Charlyn Sora, MD 09/12/24 [phone]

## 2024-09-12 NOTE — Plan of Care (Signed)
  Problem: Activity: Goal: Interest or engagement in activities will improve Outcome: Progressing Goal: Sleeping patterns will improve Outcome: Progressing   Problem: Safety: Goal: Periods of time without injury will increase Outcome: Progressing

## 2024-09-12 NOTE — ED Notes (Signed)
 No changes, restless, pleasantly agitated, persistently, repetitively, and unceasingly talking to self. Lying in stretcher. Continues to adjust, move and replace blankets, glasses, socks.

## 2024-09-12 NOTE — Progress Notes (Signed)
 Admission Note: Patient is a 54 years old female admitted to the unit under IVC from [facility] for medication noncompliance and audiovisual hallucinations.  Patient is alert and oriented to person and place.  [name] thought process is disorganized with tangential speech.  Stated she is here to work on her family relationship.  Patient observed talking to herself.  Admission plan of care reviewed, consent signed.  Skin and personal belongings completed.  Patient is appears disheveled and malodorous.  Items deemed contraband placed in the locker.  Patient oriented to the unit, staff and room.  Routine safety checks initiated.  Patient is safe on the unit.

## 2024-09-12 NOTE — ED Notes (Signed)
 Patient belongings given to officer at discharge.

## 2024-09-12 NOTE — Group Note (Signed)
 Date:  09/12/2024 Time:  8:30 PM  Group Topic/Focus:  Wrap-Up Group:   The focus of this group is to help patients review their daily goal of treatment and discuss progress on daily workbooks.    Participation Level:  Did Not Attend  [name] 09/12/2024, 8:30 PM

## 2024-09-12 NOTE — ED Notes (Signed)
 Pt belongings are in the 9-12 Alpha B cabinet behind the nursing station

## 2024-09-12 NOTE — Group Note (Unsigned)
 Date:  09/12/2024 Time:  8:17 PM  Group Topic/Focus:  Wrap-Up Group:   The focus of this group is to help patients review their daily goal of treatment and discuss progress on daily workbooks.     Participation Level:  {BHH PARTICIPATION OZCZO:77735}  Participation Quality:  {BHH PARTICIPATION QUALITY:22265}  Affect:  {BHH AFFECT:22266}  Cognitive:  {BHH COGNITIVE:22267}  Insight: {BHH Insight2:20797}  Engagement in Group:  {BHH ENGAGEMENT IN HMNLE:77731}  Modes of Intervention:  {BHH MODES OF INTERVENTION:22269}  Additional Comments:  ***  [name] 09/12/2024, 8:17 PM

## 2024-09-12 NOTE — BHH Group Notes (Signed)
 Adult Psychoeducational Group Note  Date:  09/12/2024 Time:  3:39 PM  Group Topic/Focus: Occupational Therapy  Participation Level:  Did Not Attend [name] 09/12/2024, 3:39 PM

## 2024-09-12 NOTE — ED Notes (Signed)
 Pt transferred from [facility] , pt was acting up and hostile to staff. Security called for assist. Pt transferred to rm 12

## 2024-09-12 NOTE — ED Notes (Signed)
 Lunch tray provided to patient. Patient sitting on bed, continuously talking to self but pleasant.

## 2024-09-13 DIAGNOSIS — F250 Schizoaffective disorder, bipolar type: Secondary | ICD-10-CM | POA: Diagnosis not present

## 2024-09-13 MED ORDER — DIVALPROEX SODIUM ER 500 MG PO TB24
750.0000 mg | ORAL_TABLET | Freq: Two times a day (BID) | ORAL | Status: DC
  Administered 2024-09-13 – 2024-09-16 (×6): 750 mg via ORAL
  Filled 2024-09-13 (×6): qty 1

## 2024-09-13 MED ORDER — IBUPROFEN 800 MG PO TABS: 800.0000 mg | ORAL_TABLET | Freq: Three times a day (TID) | ORAL | Status: DC | PRN

## 2024-09-13 MED ORDER — CLOZAPINE 25 MG PO TABS
50.0000 mg | ORAL_TABLET | Freq: Every day | ORAL | Status: DC
  Administered 2024-09-13 – 2024-09-15 (×3): 50 mg via ORAL
  Filled 2024-09-13 (×3): qty 2

## 2024-09-13 MED ORDER — SENNA 8.6 MG PO TABS: 1.0000 | ORAL_TABLET | Freq: Every evening | ORAL | Status: DC | PRN

## 2024-09-13 MED ORDER — ALBUTEROL SULFATE HFA 108 (90 BASE) MCG/ACT IN AERS
1.0000 | INHALATION_SPRAY | Freq: Four times a day (QID) | RESPIRATORY_TRACT | Status: DC | PRN
  Administered 2024-09-18 – 2024-09-29 (×2): 2 via RESPIRATORY_TRACT
  Filled 2024-09-13: qty 6.7

## 2024-09-13 MED ORDER — POLYETHYLENE GLYCOL 3350 17 G PO PACK: 17.0000 g | PACK | Freq: Every day | ORAL | Status: DC | PRN

## 2024-09-13 NOTE — BHH Group Notes (Signed)
 Adult Psychoeducational Group Note  Date:  09/13/2024 Time:  10:55 AM  Group Topic/Focus:  Goals Group:   The focus of this group is to help patients establish daily goals to achieve during treatment and discuss how the patient can incorporate goal setting into their daily lives to aide in recovery.  Participation Level:  Active  Participation Quality:  Attentive  Affect:  Appropriate  Cognitive:  Appropriate  Insight: Good  Engagement in Group:  Engaged  Modes of Intervention:  Discussion  Additional Comments:  Metta Annett Redman Ramsay 09/13/2024, 10:55 AM

## 2024-09-13 NOTE — H&P (Addendum)
 " Psychiatric Admission Assessment Adult  Patient Identification: [name] MRN:  [id number] Date of Evaluation:  09/13/2024 Chief Complaint:  Schizophrenia, disorganized (HCC) [F20.1] Principal Diagnosis: Schizoaffective disorder, bipolar type (HCC) Diagnosis:  Principal Problem:   Schizoaffective disorder, bipolar type (HCC)  History of Present Illness:   [name] is a 54 y.o. female with a past psychiatric schizoaffective bipolar type, transported to the [facility] emergency department for disorganized thoughts, paranoia and refusal to take medications. She was petitioned for IVC by her ACT Team. She was transferred to the [facility] for symptomatic stabilization and medication management.  On assessment this morning, patient was approached in the day room and willing to engage with this note clinical research associate for psychiatric assessment. She reports that prior to admission, her ACT team came to visit her, however they were mistaken that she needed to continue to be on medications and needed to look for someone else.  Patient was calm and cooperative throughout the entire assessment.  She did display some disorganization with thought process, had pressured speech, tangential and was a poor historian.  Regarding mood symptoms she denies any ongoing depression, suicidal ideations, homicidal ideations.  She does report poor sleep getting roughly 3 to 4 hours of sleep.  Patient reports that she was previously in [employer] from 2000-2013, however was unable to provide specific details.  Also notes that she graduated from [employer] and went to [employer].  She got a degree in public health.  She reports that she was previously married and had 2 daughters.  Noted that her daughter [name] had also attended some college work.  Per the IVC taken out by her ACT team.  They stated that patient was very hostile and aggressive concerning for manic state.  States that the  patient was previously diagnosed with schizophrenia and was not compliant with her medications.  They were concerned due to the respondent being unable to carry out a coherent conversation, unaware of her surroundings,hoarding items in her home making it is not habitable, infested with pests, and not attending to her hygiene displaying overtly disorganized behavior.  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  Associated Signs/Symptoms: Depression Symptoms:  Denies  (Hypo) Manic Symptoms:  Flight of Ideas, Pressured speech  Anxiety Symptoms:  Denies  Psychotic Symptoms:  Delusions, PTSD Symptoms: UTA, due to patient current psychosis  Did the patient present with any abnormal findings indicating the need for additional neurological or psychological testing?  No  Past Psychiatric History: Pt, unable to provider information and information is per chart review Prior psychiatric history includes diagnoses of: Schizoaffective bipolar type, schizophrenia, bipolar disorder Current medication regimen: Unable to confirm will discuss with ACT team on Monday Prior medication trials include: Prev trials- Depakote  ER, Risperdal  (helped to calm her), Abilify  PO and LAI, PO paliperidone  while titrating LAI; haldol (allergies), prolixin, invega  , zyprexa , seroquel, Clozaril  (unsure of compliance with regimen) Hospitalizations: Multiple hospitalizations most recent hospitalization was in [place] May 2025, was going [facility] 2023 Current/prior outpatient treatment: [name]  Prior rehab hx: UTA Psychotherapy hx: UTA  History of suicide: Reports suicide attempt in college by cutting wrists History of homicide: UTA Neuromodulation history: ECT in 1993 Current Psychiatrist: [facility] Current therapist: UTA   Is the patient at risk  to  self? Yes.    Has the patient been a risk to self in the past 6 months? No.  Has the patient been a risk to self within the distant past? Yes.    Is the patient a risk to others? No.  Has the patient been a risk to others in the past 6 months? No.  Has the patient been a risk to others within the distant past? No.   [place] Scale:  Flowsheet Row [facility]) [facility]24 [facility] Pre-admit (Canceled) [facility]  C-SSRS RISK CATEGORY No Risk No Risk No Risk    Alcohol Screening: Patient refused Alcohol Screening Tool: Yes 1. How often do you have a drink containing alcohol?: Never 2. How many drinks containing alcohol do you have on a typical day when you are drinking?: 1 or 2 3. How often do you have six or more drinks on one occasion?: Never AUDIT-C Score: 0 4. How often during the last year have you found that you were not able to stop drinking once you had started?: Never 5. How often during the last year have you failed to do what was normally expected from you because of drinking?: Never 6. How often during the last year have you needed a first drink in the morning to get yourself going after a heavy drinking session?: Never 7. How often during the last year have you had a feeling of guilt of remorse after drinking?: Never 8. How often during the last year have you been unable to remember what happened the night before because you had been drinking?: Never 9. Have you or someone else been injured as a result of your drinking?: No 10. Has a relative or friend or a doctor or another health worker been concerned about your drinking or suggested you cut down?: No Alcohol Use Disorder Identification Test Final Score (AUDIT): 0 Substance Abuse History in the last 12 months:  No. Consequences of Substance  Abuse: NA Previous Psychotropic Medications: Yes  Psychological Evaluations: Yes  Past Medical History:  Past Medical History:  Diagnosis Date   Anxiety    Asthma    Bipolar 1 disorder (HCC)    Depression    Gallstones 08/2020   Hypertension    Insomnia, persistent    Prediabetes    Schizophrenic disorder (HCC)    Seizures (HCC)     Past Surgical History:  Procedure Laterality Date   BREAST LUMPECTOMY Left 07/2013   TONSILLECTOMY     TUBAL LIGATION  2002   Family History:  Family History  Problem Relation Age of Onset   Depression Mother    Gout Mother    Cancer Father        prostate   Other Father        lung issue   Alcoholism Other    Heart attack Paternal Grandfather    Heart attack Paternal Grandmother    Heart attack Maternal Grandmother    Heart attack Maternal Grandfather    Depression Son    Anxiety disorder Son    Family Psychiatric  History: Per chart review  Psych: Mother and maternal grandmother with schizophrenia, dad schizoaffective disorder depressive type Psych Rx: Unknown SA/HA: Uncle completed suicide Substance use family hx: Denies  Tobacco Screening: Tobacco Use History[1]  BH Tobacco Counseling     Are you interested in  Tobacco Cessation Medications?  N/A, patient does not use tobacco products Counseled patient on smoking cessation:  Refused/Declined practical counseling Reason Tobacco Screening Not Completed: Patient Refused Screening       Social History:  Social History   Substance and Sexual Activity  Alcohol Use Not Currently     Social History   Substance and Sexual Activity  Drug Use No    Additional Social History: Marital status: Married Number of Years Married: 27 What types of issues is patient dealing with in the relationship?: Keep the home clean, getting food, and making sure I check in with the kids Are you sexually active?: No What is your sexual orientation?: no not at this time Does patient have  children?: Yes How many children?: 2 How is patient's relationship with their children?: Daughter is 104 and Son is 60 years old.                         Allergies:  Allergies[2] Lab Results:  No results found for this or any previous visit (from the past 48 hours).   Blood Alcohol level:  Lab Results  Component Value Date   [facility] <15 09/11/2024   ETH <10 06/22/2023    Metabolic Disorder Labs:  Lab Results  Component Value Date   HGBA1C 5.5 04/27/2022   MPG 111.15 04/27/2022   MPG 119.76 08/12/2021   Lab Results  Component Value Date   PROLACTIN 74.6 (H) 09/28/2019   PROLACTIN 4.2 (L) 12/19/2018   Lab Results  Component Value Date   CHOL 150 04/27/2022   TRIG 66 04/27/2022   HDL 41 04/27/2022   CHOLHDL 3.7 04/27/2022   VLDL 13 04/27/2022   LDLCALC 96 04/27/2022   LDLCALC 74 08/16/2021    Current Medications: Current Facility-Administered Medications  Medication Dose Route Frequency Provider Last Rate Last Admin   albuterol  (VENTOLIN  HFA) 108 (90 Base) MCG/ACT inhaler 1-2 puff  1-2 puff Inhalation Q6H PRN Lenard Calin, MD       cloZAPine  (CLOZARIL ) tablet 50 mg  50 mg Oral QHS Lenard Calin, MD       divalproex  (DEPAKOTE  ER) 24 hr tablet 750 mg  750 mg Oral Q12H Lenard Calin, MD       hydrOXYzine  (ATARAX ) tablet 25 mg  25 mg Oral TID PRN Bobbitt, Shalon E, NP   25 mg at 09/13/24 1301   ibuprofen  (ADVIL ) tablet 800 mg  800 mg Oral Q8H PRN Lenard Calin, MD       LORazepam  (ATIVAN ) tablet 1 mg  1 mg Oral Daily PRN Onuoha, Chinwendu V, NP       Or   LORazepam  (ATIVAN ) injection 1 mg  1 mg Intramuscular Daily PRN Onuoha, Chinwendu V, NP       OLANZapine  zydis (ZYPREXA ) disintegrating tablet 5 mg  5 mg Oral TID PRN Onuoha, Chinwendu V, NP   5 mg at 09/12/24 2050   traZODone  (DESYREL ) tablet 50 mg  50 mg Oral QHS PRN Bobbitt, Shalon E, NP   50 mg at 09/12/24 2136   PTA Medications: Medications Prior to Admission  Medication Sig Dispense Refill Last  Dose/Taking   albuterol  (VENTOLIN  HFA) 108 (90 Base) MCG/ACT inhaler Inhale 1-2 puffs into the lungs every 6 (six) hours as needed for wheezing or shortness of breath.      ascorbic acid  (VITAMIN C) 500 MG tablet Take 1 tablet (500 mg total) by mouth 2 (two) times daily. (Patient not taking: Reported on  09/11/2024) 60 tablet 0    budesonide -formoterol  (SYMBICORT ) 160-4.5 MCG/ACT inhaler Inhale 2 puffs into the lungs daily. (Patient not taking: Reported on 09/11/2024) 1 each 12    CAPLYTA 42 MG capsule Take 42 mg by mouth daily.      divalproex  (DEPAKOTE  ER) 500 MG 24 hr tablet Take 2 tablets (1,000 mg total) by mouth at bedtime. (Patient not taking: Reported on 09/11/2024) 60 tablet 0    fluticasone  (FLONASE ) 50 MCG/ACT nasal spray Place 1 spray into both nostrils daily. (Patient not taking: Reported on 09/11/2024) 18.2 mL 2    fluticasone  (FLONASE ) 50 MCG/ACT nasal spray Place 1 spray into both nostrils daily. (Patient not taking: Reported on 09/11/2024) 11.1 mL 0    ibuprofen  (ADVIL ) 800 MG tablet Take 1 tablet (800 mg total) by mouth every 8 (eight) hours as needed for moderate pain. (Patient not taking: Reported on 09/11/2024) 21 tablet 0    metFORMIN  (GLUCOPHAGE ) 500 MG tablet Take 1 tablet (500 mg total) by mouth daily with breakfast. (Patient not taking: Reported on 09/11/2024) 30 tablet 0    ondansetron  (ZOFRAN -ODT) 4 MG disintegrating tablet Take 1 tablet (4 mg total) by mouth every 8 (eight) hours as needed for nausea or vomiting. (Patient not taking: Reported on 09/11/2024) 20 tablet 0    paliperidone  (INVEGA  SUSTENNA) 234 MG/1.5ML injection Inject 234 mg into the muscle every 28 (twenty-eight) days. (Patient not taking: Reported on 09/11/2024) 1.5 mL 0    traZODone  (DESYREL ) 50 MG tablet Take 1 tablet (50 mg total) by mouth at bedtime as needed for sleep. (Patient not taking: Reported on 09/11/2024) 30 tablet 0     AIMS:  ,  ,  ,  ,  ,  ,    Musculoskeletal: Strength & Muscle Tone: within normal  limits Gait & Station: normal Patient leans: N/A            Psychiatric Specialty Exam:  Presentation  General Appearance:  Casual  Eye Contact: Fair  Speech: Pressured  Speech Volume: Increased  Handedness: Right   Mood and Affect  Mood: Euthymic  Affect: Appropriate; Congruent   Thought Process  Thought Processes: Disorganized  Duration of Psychotic Symptoms:N/A Past Diagnosis of Schizophrenia or Psychoactive disorder: Yes  Descriptions of Associations:Tangential  Orientation:Partial  Thought Content:Illogical; Tangential  Hallucinations:Hallucinations: None  Ideas of Reference:None  Suicidal Thoughts:Suicidal Thoughts: No  Homicidal Thoughts:Homicidal Thoughts: No   Sensorium  Memory: Immediate Poor; Recent Poor  Judgment: Impaired  Insight: Poor   Executive Functions  Concentration: Poor  Attention Span: Poor  Recall: Poor  Fund of Knowledge: Poor  Language: Fair   Psychomotor Activity  Psychomotor Activity: Psychomotor Activity: Normal   Assets  Assets: Housing; Financial Resources/Insurance   Sleep  Sleep: Sleep: Fair  Estimated Sleeping Duration (Last 24 Hours): 8.00-8.50 hours   Physical Exam: Physical Exam Constitutional:      Appearance: Normal appearance. She is not ill-appearing or toxic-appearing.  Pulmonary:     Effort: Pulmonary effort is normal.  Musculoskeletal:        General: Normal range of motion.  Neurological:     Mental Status: She is alert.    Review of Systems  Respiratory:  Negative for cough.   Cardiovascular:  Negative for chest pain, orthopnea and leg swelling.   Blood pressure (!) 123/92, pulse 84, temperature 98 F (36.7 C), temperature source Oral, resp. rate 16, height 5' 7 (1.702 m), weight 103 kg, SpO2 100%. Body mass index is 35.55 kg/m.  Treatment  Plan Summary: Daily contact with patient to assess and evaluate symptoms and progress in treatment and  Medication management  [name] is a 54 y.o. female with a past psychiatric schizoaffective bipolar type, transported to the [facility] emergency department for disorganized thoughts, paranoia and refusal to take medications. She was petitioned for IVC by her ACT Team. She was transferred to the [facility] for symptomatic stabilization and medication management.  On assessment this morning, patient was approached in the day room and willing to engage with this note clinical research associate for psychiatric assessment. She reports that prior to admission, her ACT team came to visit her, however they were mistaken that she needed to continue to be on medications and needed to look for someone else.  Patient was calm and cooperative throughout the entire assessment. She did display some disorganization with thought process, had pressured speech, tangential and was a poor historian.  Per IVC, patient exhibited similar concerning behavior to her ACT team. Will start Depakote  in order to address mood stabilization and suspected mania. 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  Patient has previously been on before in the past and will continue to discuss medications with ACT team to get a better picture of what the patient was supposed to be taking previously.  Will obtain appropriate labs and complete review of all systems daily with the patient for tolerability of clozapine .  Continue to assess daily while on antipsychotic monotherapy, patient has been on dual therapy before in the past and will continue to assess for utility going forward during this hospitalization.  Diagnoses / Active Problems: Schizoaffective Bipolar Type   PLAN: Safety and Monitoring:  -- Involuntary admission to inpatient psychiatric unit for safety, stabilization and treatment  -- Daily contact with patient to assess and evaluate  symptoms and progress in treatment  -- [name] case to be discussed in multi-disciplinary team meeting  -- Observation Level : q15 minute checks  -- Vital signs:  q12 hours  -- Precautions: suicide, elopement, and assault  2. Psychiatric Diagnoses and Treatment:   Schizoaffective, Bipolar Type   -- Start Depakote  750 mg every 12 hours for bipolar component of schizoaffective disorder   -- Start Clozapine  50 mg at bedtime for schizoaffective disorder  --  The risks/benefits/side-effects/alternatives to this medication were discussed in detail with the patient and time was given for questions. The patient consents to medication trial.  -- FDA  -- Metabolic profile and EKG monitoring obtained while on an atypical antipsychotic  (BMI: 35.55  Lipid Panel: pending   HbgA1c: pending   QTc: 459 -- Clozapine  labs ordered for tomorrow morning include: CBC, troponins, CK and CK-MB and CRP  -- Encouraged patient to participate in unit milieu and in scheduled group therapies   -- Short Term Goals: Ability to identify changes in lifestyle to reduce recurrence of condition will improve, Ability to identify and develop effective coping behaviors will improve, Compliance with prescribed medications will improve, and Ability to identify triggers associated with substance abuse/mental health issues will improve  -- Long Term Goals: Improvement in symptoms so as ready for discharge  3. Medical Issues Being Addressed:   None currently  Supportive PRNS ordered, refer to [facility] for specific details    4. Discharge Planning:   -- Social work and case management to assist with discharge planning and identification of hospital follow-up needs prior to discharge  -- Estimated LOS:  7  days, patient under IVC  -- Discharge Concerns: Need to establish a safety plan; Medication compliance and effectiveness  -- Discharge Goals: Return home with outpatient referrals for mental health follow-up including medication  management/psychotherapy  I certify that inpatient services furnished can reasonably be expected to improve the patient's condition.    PATTI OLDEN, MD 1/17/20262:24 PM     [1]  Social History Tobacco Use  Smoking Status Former   Types: Cigarettes  Smokeless Tobacco Never  [2]  Allergies Allergen Reactions   Milk-Related Compounds Nausea And Vomiting   Pollen Extract Other (See Comments)    Seasonal allergies   Porcine (Pork) Protein-Containing Drug Products Nausea Only   Haldol [Haloperidol Decanoate] Swelling, Anxiety and Other (See Comments)    Stiffness, eyes bulging- too   Penicillins Nausea And Vomiting, Swelling and Rash   Shrimp [Shellfish Allergy] Nausea Only, Rash and Other (See Comments)    Raw shrimp   "

## 2024-09-13 NOTE — Social Work (Signed)
 Adult Psychoeducational Group Note  Date:  09/13/2024 Time:  1:24 PM  Group Topic/Focus:  Making Healthy Choices:   The focus of this group is to help patients identify negative/unhealthy choices they were using prior to admission and identify positive/healthier coping strategies to replace them upon discharge.  Participation Level:  Active  Participation Quality:  Attentive  Affect:  Appropriate  Cognitive:  Appropriate  Insight: Good  Engagement in Group:  Engaged  Modes of Intervention:  Activity  Additional Comments:  Metta Gear, Aster Eckrich Ramsay 09/13/2024, 1:24 PM

## 2024-09-13 NOTE — Group Note (Signed)
 Date:  09/13/2024 Time:  3:26 PM  Group Topic/Focus:   Intellectual Wellness    Participation Level:  Active  Participation Quality:  Attentive  Affect:  Appropriate  Cognitive:  Delusional  Insight: Improving  Engagement in Group:  Engaged  Modes of Intervention:  Discussion Additional Comments:    [name] 09/13/2024, 3:26 PM

## 2024-09-13 NOTE — Plan of Care (Signed)
   Problem: Education: Goal: Knowledge of [facility] Education information/materials will improve Outcome: Progressing Goal: Emotional status will improve Outcome: Progressing Goal: Mental status will improve Outcome: Progressing Goal: Verbalization of understanding the information provided will improve Outcome: Progressing

## 2024-09-13 NOTE — BHH Suicide Risk Assessment (Signed)
 " [facility] Discharge Suicide Risk Assessment   Principal Problem: Schizoaffective disorder, bipolar type [facility]) Discharge Diagnoses: Principal Problem:   Schizoaffective disorder, bipolar type (HCC)  Subjective Data:    [name] is a 54 y.o. female with a past psychiatric schizoaffective bipolar type, transported to the [facility] emergency department for disorganized thoughts, paranoia and refusal to take medications. She was petitioned for IVC by her ACT Team. She was transferred to the [facility] for symptomatic stabilization and medication management.   On assessment this morning, patient was approached in the day room and willing to engage with this note clinical research associate for psychiatric assessment. She reports that prior to admission, her ACT team came to visit her, however they were mistaken that she needed to continue to be on medications and needed to look for someone else.  Patient was calm and cooperative throughout the entire assessment.  She did display some disorganization with thought process, had pressured speech, tangential and was a poor historian.  Regarding mood symptoms she denies any ongoing depression, suicidal ideations, homicidal ideations.  She does report poor sleep getting roughly 3 to 4 hours of sleep.  Patient reports that she was previously in [employer] from 2000-2013, however was unable to provide specific details.  Also notes that she graduated from [employer] high [employer] and went to [employer].  She got a degree in public health.  She reports that she was previously married and had 2 daughters.  Noted that her daughter [name] had also attended some college work.   Per the IVC taken out by her ACT team.  They stated that patient was very hostile and aggressive concerning for manic state.  States that the patient was previously diagnosed with schizophrenia and was not compliant with her medications.  They were concerned due to the respondent being  unable to carry out a coherent conversation, unaware of her surroundings,hoarding items in her home making it is not habitable, infested with pests, and not attending to her hygiene displaying overtly disorganized behavior.  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   Associated Signs/Symptoms: Depression Symptoms:  Denies  (Hypo) Manic Symptoms:  Flight of Ideas, Pressured speech  Anxiety Symptoms:  Denies  Psychotic Symptoms:  Delusions, PTSD Symptoms: UTA, due to patient current psychosis   Did the patient present with any abnormal findings indicating the need for additional neurological or psychological testing?  No Continued Clinical Symptoms:    The Alcohol Use Disorders Identification Test, Guidelines for Use in Primary Care, Second Edition.  World Science Writer [facility]). Score between 0-7:  no or low risk or alcohol related problems. Score between 8-15:  moderate risk of alcohol related problems. Score between 16-19:  high risk of alcohol related problems. Score 20 or above:  warrants further diagnostic evaluation for alcohol dependence and treatment. Musculoskeletal: Strength & Muscle Tone: within normal limits Gait & Station: normal Patient leans: N/A  Psychiatric Specialty Exam  Presentation  General Appearance:  Casual  Eye Contact: Fair  Speech: Pressured  Speech Volume: Increased  Handedness: Right   Mood and Affect  Mood: Euthymic  Duration of Depression Symptoms: No data recorded Affect: Appropriate; Congruent   Thought Process  Thought Processes: Disorganized  Descriptions of Associations:Tangential  Orientation:Partial  Thought Content:Illogical; Tangential  History of Schizophrenia/Schizoaffective  disorder:Yes  Duration of Psychotic Symptoms:Greater than six  months  Hallucinations:Hallucinations: None  Ideas of Reference:None  Suicidal Thoughts:Suicidal Thoughts: No  Homicidal Thoughts:Homicidal Thoughts: No   Sensorium  Memory: Immediate Poor; Recent Poor  Judgment: Impaired  Insight: Poor   Executive Functions  Concentration: Poor  Attention Span: Poor  Recall: Poor  Fund of Knowledge: Poor  Language: Fair   Psychomotor Activity  Psychomotor Activity: Psychomotor Activity: Normal   Assets  Assets: Housing; Financial Resources/Insurance   Sleep  Sleep: Sleep: Fair  Estimated Sleeping Duration (Last 24 Hours): 8.00-8.50 hours  Physical Exam: Physical Exam Constitutional:      Appearance: Normal appearance. She is not ill-appearing or toxic-appearing.  Pulmonary:     Effort: Pulmonary effort is normal.  Musculoskeletal:        General: Normal range of motion.  Neurological:     Mental Status: She is alert.     Review of Systems  Respiratory:  Negative for cough.   Cardiovascular:  Negative for chest pain, orthopnea and leg swelling. Blood pressure (!) 123/92, pulse 84, temperature 98 F (36.7 C), temperature source Oral, resp. rate 16, height 5' 7 (1.702 m), weight 103 kg, SpO2 100%. Body mass index is 35.55 kg/m.  Mental Status Per Nursing Assessment::   On Admission:  NA  Demographic Factors:  Divorced or widowed and Living alone  Loss Factors: Decline in physical health  Historical Factors: Prior suicide attempts and Family history of mental illness or substance abuse  Risk Reduction Factors:   NA  Continued Clinical Symptoms:  Schizoaffective disorder, bipolar type Unstable or Poor Therapeutic Relationship Previous Psychiatric Diagnoses and Treatments  Cognitive Features That Contribute To Risk:  Impaired due to psychosis/mania, loss of executive function     Suicide Risk:  Mild: Patient with a prior history of suicidal ideations and suicide attempt, patient not  currently endorsing any suicidal thoughts, however does not have great executive function given current psychiatric decompensation.  Patient also noncompliant on medications which puts her at an increased risk of self-harm  PLAN OF CARE:    See H&P for full plan of care   I certify that inpatient services furnished can reasonably be expected to improve the patient's condition.     PATTI OLDEN, MD 09/13/2024, 2:30 PM "

## 2024-09-13 NOTE — Group Note (Addendum)
 LCSW Group Therapy Note  Group Date: 09/13/2024 Start Time: 1030 End Time: 1100   Type of Therapy and Topic:  Group Therapy - Healthy Ways of Asking for Help as a Coping Technique  Participation Level:  Active   Description of Group The focus of this group was to determine what healthy techniques typically are used when asking someone for help. What verbiage would be helpful in coping with various problems and who individually they can call. Patients were guided in becoming aware of the differences between healthy and unhealthy coping techniques. Patients were asked to identify 2-3 healthy people and/or organizations they would like to learn to use more effectively.  Therapeutic Goals Patients learned that coping is what human beings do all day long to deal with various situations in their lives Patients defined and discussed healthy vs unhealthy coping techniques Patients identified their preferred coping techniques and identified someone to whom they could call when needed. Patients determined 2-3 healthy coping skills they would like to become more familiar with and use more often. Patients provided support and ideas to each other   Summary of Patient Progress:  During group, Ms. Knobloch expressed she, calls the people when she needs support or help in the community. Patient is disorganized unable to identify or name a specific person. When asked to identify an emotional picture that described how he/she felt today, patient reported she felt colorful. Patient proved open to input from peers and feedback from CSW.   Therapeutic Modalities Cognitive Behavioral Therapy Motivational Interviewing  Camelia Olden, [place] 09/13/2024  12:22 PM

## 2024-09-13 NOTE — BHH Group Notes (Signed)
 Adult Psychoeducational Group Note  Date:  09/13/2024 Time:  8:56 PM  Group Topic/Focus:  Wrap-Up Group:   The focus of this group is to help patients review their daily goal of treatment and discuss progress on daily workbooks.  Participation Level:  Active  Participation Quality:  Appropriate  Affect:  Appropriate  Cognitive:  Appropriate  Insight: Appropriate  Engagement in Group:  Engaged  Modes of Intervention:  Discussion  Additional Comments:  Pt Attended group.   Drue Pouch 09/13/2024, 8:56 PM

## 2024-09-13 NOTE — Plan of Care (Addendum)
 Pt A & O to self and place. Denies SI, HI, AVH and pain when assessed. However, pt remains delusional, disorganized, confused, tangential with flight of ideas, pressured speech on interactions. Believes she has 4 children  but does not want anymore kids because I don't want to be on welfare. I hope you have an apartment now because being an immigrant here, you need one. My [name] team thought I was someone else. Emotional support, encouragement and reassurance offered. Pt tolerated meals, medications and fluids well. Pt attended scheduled groups, required multiple redirections due to being intrusive. Safety checks maintained at Q 15 minutes intervals without incident.   Problem: Coping: Goal: Ability to verbalize frustrations and anger appropriately will improve Outcome: Progressing Goal: Ability to demonstrate self-control will improve Outcome: Progressing

## 2024-09-13 NOTE — Group Note (Unsigned)
 Date:  09/13/2024 Time:  9:35 AM  Group Topic/Focus:  Goals Group:   The focus of this group is to help patients establish daily goals to achieve during treatment and discuss how the patient can incorporate goal setting into their daily lives to aide in recovery.     Participation Level:  {BHH PARTICIPATION OZCZO:77735}  Participation Quality:  {BHH PARTICIPATION QUALITY:22265}  Affect:  {BHH AFFECT:22266}  Cognitive:  {BHH COGNITIVE:22267}  Insight: {BHH Insight2:20797}  Engagement in Group:  {BHH ENGAGEMENT IN HMNLE:77731}  Modes of Intervention:  {BHH MODES OF INTERVENTION:22269}  Additional Comments:  ***  Annett Redman Ramsay 09/13/2024, 9:35 AM

## 2024-09-13 NOTE — Group Note (Signed)
 Date:  09/13/2024 Time:  3:41 PM  Group Topic/Focus:  Emotional Wellness. Healthy choices.    Participation Level:  Did Not Attend   Annett Redman Ramsay 09/13/2024, 3:41 PM

## 2024-09-13 NOTE — Progress Notes (Signed)
(  Sleep Hours) -9.5 (Any PRNs that were needed, meds refused, or side effects to meds)- hydroxyzine , trazodone  (Any disturbances and when (visitation, over night)-none (Concerns raised by the patient)- none (SI/HI/AVH)- denies all
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
## 2024-09-14 LAB — CBC WITH DIFFERENTIAL/PLATELET
Abs Immature Granulocytes: 0.01 K/uL (ref 0.00–0.07)
Basophils Absolute: 0 K/uL (ref 0.0–0.1)
Basophils Relative: 1 %
Eosinophils Absolute: 0.3 K/uL (ref 0.0–0.5)
Eosinophils Relative: 7 %
HCT: 37.6 % (ref 36.0–46.0)
Hemoglobin: 12.5 g/dL (ref 12.0–15.0)
Immature Granulocytes: 0 %
Lymphocytes Relative: 47 %
Lymphs Abs: 2.3 K/uL (ref 0.7–4.0)
MCH: 30 pg (ref 26.0–34.0)
MCHC: 33.2 g/dL (ref 30.0–36.0)
MCV: 90.4 fL (ref 80.0–100.0)
Monocytes Absolute: 0.3 K/uL (ref 0.1–1.0)
Monocytes Relative: 7 %
Neutro Abs: 1.8 K/uL (ref 1.7–7.7)
Neutrophils Relative %: 38 %
Platelets: 255 K/uL (ref 150–400)
RBC: 4.16 MIL/uL (ref 3.87–5.11)
RDW: 12.9 % (ref 11.5–15.5)
WBC: 4.8 K/uL (ref 4.0–10.5)
nRBC: 0 % (ref 0.0–0.2)

## 2024-09-14 LAB — LIPID PANEL
Cholesterol: 170 mg/dL (ref 0–200)
HDL: 47 mg/dL
LDL Cholesterol: 109 mg/dL — ABNORMAL HIGH (ref 0–99)
Total CHOL/HDL Ratio: 3.6 ratio
Triglycerides: 67 mg/dL
VLDL: 13 mg/dL (ref 0–40)

## 2024-09-14 LAB — SYPHILIS: RPR W/REFLEX TO RPR TITER AND TREPONEMAL ANTIBODIES, TRADITIONAL SCREENING AND DIAGNOSIS ALGORITHM: RPR Ser Ql: NONREACTIVE

## 2024-09-14 LAB — TSH: TSH: 0.713 u[IU]/mL (ref 0.350–4.500)

## 2024-09-14 LAB — CK TOTAL AND CKMB (NOT AT ARMC)
CK, MB: 1.8 ng/mL (ref 0.5–5.0)
Total CK: 107 U/L (ref 38–234)

## 2024-09-14 LAB — PRO BRAIN NATRIURETIC PEPTIDE: Pro Brain Natriuretic Peptide: <50 pg/mL

## 2024-09-14 LAB — HEMOGLOBIN A1C
Hgb A1c MFr Bld: 5.6 % (ref 4.8–5.6)
Mean Plasma Glucose: 114.02 mg/dL

## 2024-09-14 LAB — TROPONIN T, HIGH SENSITIVITY: Troponin T High Sensitivity: <15 ng/L (ref 0–19)

## 2024-09-14 NOTE — BHH Group Notes (Signed)
 Adult Psychoeducational Group Note  Date:  09/14/2024 Time:  10:42 AM  Group Topic/Focus:  Goals Group:   The focus of this group is to help patients establish daily goals to achieve during treatment and discuss how the patient can incorporate goal setting into their daily lives to aide in recovery.  Participation Level:  Did Not Attend  Participation Quality:    Affect:    Cognitive:    Insight:   Engagement in Group:    Modes of Intervention:    Additional Comments:    [name] 09/14/2024, 10:42 AM

## 2024-09-14 NOTE — Plan of Care (Signed)
   Problem: Education: Goal: Knowledge of [facility] Education information/materials will improve Outcome: Progressing Goal: Emotional status will improve Outcome: Progressing Goal: Mental status will improve Outcome: Progressing Goal: Verbalization of understanding the information provided will improve Outcome: Progressing

## 2024-09-14 NOTE — Group Note (Signed)
 Date:  09/14/2024 Time:  8:57 PM  Group Topic/Focus:  Wrap-Up Group:   The focus of this group is to help patients review their daily goal of treatment and discuss progress on daily workbooks.    Participation Level:  Active  Participation Quality:  Appropriate  Affect:  Labile  Cognitive:  Lacking  Insight: Limited  Engagement in Group:  Engaged  Modes of Intervention:  Education  Additional Comments:  Patient attended and participated in group tonight.  She reports that she did attend some group today. She dont remember what she did.  She keep jumping from one thing to another.  [name] 09/14/2024, 8:57 PM

## 2024-09-14 NOTE — Group Note (Unsigned)
 Date:  09/14/2024 Time:  8:32 PM  Group Topic/Focus:  Wrap-Up Group:   The focus of this group is to help patients review their daily goal of treatment and discuss progress on daily workbooks.     Participation Level:  {BHH PARTICIPATION OZCZO:77735}  Participation Quality:  {BHH PARTICIPATION QUALITY:22265}  Affect:  {BHH AFFECT:22266}  Cognitive:  {BHH COGNITIVE:22267}  Insight: {BHH Insight2:20797}  Engagement in Group:  {BHH ENGAGEMENT IN HMNLE:77731}  Modes of Intervention:  {BHH MODES OF INTERVENTION:22269}  Additional Comments:  ***  Seann Genther [place] 09/14/2024, 8:32 PM

## 2024-09-14 NOTE — Progress Notes (Addendum)
 [facility] Inpatient Psychiatry Progress Note  Date: 09/14/24 Patient: [name] MRN: [id number]  Assessment and Plan: Jasimine Simms is a 54 y.o. female with a past psychiatric schizoaffective bipolar type, transported to the [facility] emergency department for disorganized thoughts, paranoia and refusal to take medications. She was petitioned for IVC by her ACT Team. She was transferred to the [facility] for symptomatic stabilization and medication management.  Assessment this morning, patient continues to be disorganized in thought content, saying nonsensical things and word salad that. Initiated clozapine  given she has been resistant to symptomatic management with multiple antipsychotic trials.  She denies any concerning review of systems based on clozapine  dosage.  Patient also started on Depakote  to help address manic-like behavior.  She seems to be tolerating her medicines well.  She is calm and cooperative on the floor.  No agitation events noted.  Continue to assess patient daily and attempt to get in contact with her ACT team this week to discuss medications.   # Schizoaffective disorder, bipolar type (HCC) -- Continue Depakote  750 mg every 12 hours for bipolar component of schizoaffective disorder  -- Continue Clozapine  50 mg at bedtime for schizoaffective disorder, will continue to adjust dosage based on daily assessments  -- Metabolic profile and EKG monitoring obtained while on an atypical antipsychotic  (BMI: 35.55  Lipid Panel: LDL 109  HbgA1c: 0.6  QTc: 459 -- Clozapine  labs ordered include: CBC WNL , troponins Neg, proBNP negative - CK 107 and CK-MB 1.8  - CRP results pending  - TSH WNL - RPR Neg  Risk Assessment - Mild: Patient with a prior history of suicidal ideations and suicide attempt, patient not currently endorsing any suicidal thoughts, however does not have great executive function given current psychiatric  decompensation.  Patient also noncompliant on medications which puts her at an increased risk of self-harm   Discharge Planning Barriers to discharge: Medication compliance, Safety planning, contact with ACT Team  Estimated length of stay: 7 days  Predicted Discharge location: Home   Interval History and update:  No acute events overnight.  Patient compliant with all scheduled medications.  Received as needed hydroxyzine , trazodone    Patient was lying calmly in her bedroom upon assessment, patient was able to have some moments of linearity during my assessment.  She denied depression, anxiety, suicidal ideations, homicidal ideation or auditory visual hallucinations.  Once became more alert, she continued to exhibit disorganized thought, word salad and was tangential with responses.  Continues to report that ACT team members had her mistaken and thought that she needed medications, however it was another person they were looking for.  Physical Exam MSK/Neuro -patient is alert to person, self, place no focal deficits noted on observation Mental Status Exam Appearance - Casually dressed, disheveled Attitude - Calm, polite, not guarded Speech -fast/pressured prosody, inflection Mood -okay Affect -congruent Thought Process -disorganized, word salad, tangential  Thought Content -delusional, illogical, TC expressed where she believes ACT team mistoke her for someone else SI/HI - Denies  Perceptions - Denies AVH; not RIS Judgement/Insight - Impaired  Fund of knowledge - Lacking  Language - No impairments  Lab Results:  Admission on 09/12/2024  Component Date Value Ref Range Status   Total CK 09/14/2024 107  38 - 234 U/L Final   CK, MB 09/14/2024 1.8  0.5 - 5.0 ng/mL Final   Pro Brain Natriuretic Peptide 09/14/2024 <50.0  <300.0 pg/mL Final   Hgb A1c MFr Bld 09/14/2024 5.6  4.8 - 5.6 % Final   Mean Plasma Glucose 09/14/2024 114.02  mg/dL Final   TSH 98/81/7973 0.713  0.350 - 4.500 uIU/mL  Final   Cholesterol 09/14/2024 170  0 - 200 mg/dL Final   Triglycerides 98/81/7973 67  <150 mg/dL Final   HDL 98/81/7973 47  >40 mg/dL Final   Total CHOL/HDL Ratio 09/14/2024 3.6  RATIO Final   VLDL 09/14/2024 13  0 - 40 mg/dL Final   LDL Cholesterol 09/14/2024 109 (H)  0 - 99 mg/dL Final   RPR Ser Ql 98/81/7973 NON REACTIVE  NON REACTIVE Final   WBC 09/14/2024 4.8  4.0 - 10.5 K/uL Final   RBC 09/14/2024 4.16  3.87 - 5.11 MIL/uL Final   Hemoglobin 09/14/2024 12.5  12.0 - 15.0 g/dL Final   HCT 98/81/7973 37.6  36.0 - 46.0 % Final   MCV 09/14/2024 90.4  80.0 - 100.0 fL Final   MCH 09/14/2024 30.0  26.0 - 34.0 pg Final   MCHC 09/14/2024 33.2  30.0 - 36.0 g/dL Final   RDW 98/81/7973 12.9  11.5 - 15.5 % Final   Platelets 09/14/2024 255  150 - 400 K/uL Final   nRBC 09/14/2024 0.0  0.0 - 0.2 % Final   Neutrophils Relative % 09/14/2024 38  % Final   Neutro Abs 09/14/2024 1.8  1.7 - 7.7 K/uL Final   Lymphocytes Relative 09/14/2024 47  % Final   Lymphs Abs 09/14/2024 2.3  0.7 - 4.0 K/uL Final   Monocytes Relative 09/14/2024 7  % Final   Monocytes Absolute 09/14/2024 0.3  0.1 - 1.0 K/uL Final   Eosinophils Relative 09/14/2024 7  % Final   Eosinophils Absolute 09/14/2024 0.3  0.0 - 0.5 K/uL Final   Basophils Relative 09/14/2024 1  % Final   Basophils Absolute 09/14/2024 0.0  0.0 - 0.1 K/uL Final   Immature Granulocytes 09/14/2024 0  % Final   Abs Immature Granulocytes 09/14/2024 0.01  0.00 - 0.07 K/uL Final   Troponin T High Sensitivity 09/14/2024 <15  0 - 19 ng/L Final    Vitals: Blood pressure 119/77, pulse 84, temperature 98 F (36.7 C), temperature source Oral, resp. rate 16, height 5' 7 (1.702 m), weight 103 kg, SpO2 100%.    Callen Zuba, MD [facility] psychiatry resident PGY 2

## 2024-09-14 NOTE — Progress Notes (Signed)
" °   09/14/24 0808  Psych Admission Type (Psych Patients Only)  Admission Status Involuntary  Psychosocial Assessment  Patient Complaints Suspiciousness;Anxiety  Eye Contact Fair  Facial Expression Anxious;Flat;Animated  Affect Preoccupied  Speech Pressured;Tangential  Interaction Assertive  Motor Activity Fidgety  Appearance/Hygiene Improved  Behavior Characteristics Intrusive  Mood Anxious;Preoccupied  Thought Chartered Certified [employer]ant of ideas;Loose associations;Disorganized  Content Delusions;Preoccupation  Delusions None reported or observed  Perception Hallucinations  Hallucination Auditory  Judgment Impaired  Confusion Mild  Danger to Self  Current suicidal ideation? Denies  Danger to Others  Danger to Others None reported or observed    "

## 2024-09-14 NOTE — BHH Group Notes (Signed)
 Adult Psychoeducational Group Note  Date:  09/14/2024 Time:  3:15 PM  Group Topic/Focus: Emotional Wellness  Participation Level:  Did Not Attend  Participation Quality:    Affect:    Cognitive:    Insight:   Engagement in Group:    Modes of Intervention:    Additional Comments:    Zakhai Meisinger O 09/14/2024, 3:15 PM

## 2024-09-14 NOTE — BHH Group Notes (Signed)
 Adult Psychoeducational Group Note  Date:  09/14/2024 Time:  3:15 PM  Group Topic/Focus: Intellectual Wellness  Participation Level:  Did Not Attend  Participation Quality:    Affect:    Cognitive:    Insight:   Engagement in Group:    Modes of Intervention:    Additional Comments:    Iracema Lanagan O 09/14/2024, 3:15 PM

## 2024-09-14 NOTE — Plan of Care (Signed)

## 2024-09-14 NOTE — Group Note (Signed)
 Date:  09/14/2024 Time:  6:06 PM  Group Topic/Focus:  Relapse Prevention Planning:   The focus of this group is to define relapse and discuss the need for planning to combat relapse. Natural ways to get dopamine hits as opposed to illicit drugs which cause psychosis.    Participation Level:  Did Not Attend    [name] 09/14/2024, 6:06 PM

## 2024-09-15 ENCOUNTER — Encounter (HOSPITAL_COMMUNITY): Payer: Self-pay

## 2024-09-15 MED ORDER — CLOZAPINE 25 MG PO TABS
25.0000 mg | ORAL_TABLET | Freq: Every day | ORAL | Status: DC
  Administered 2024-09-15 – 2024-09-16 (×2): 25 mg via ORAL
  Filled 2024-09-15 (×2): qty 1

## 2024-09-15 NOTE — Group Note (Unsigned)
 Date:  09/15/2024 Time:  8:48 PM  Group Topic/Focus:  Wrap-Up Group:   The focus of this group is to help patients review their daily goal of treatment and discuss progress on daily workbooks.     Participation Level:  {BHH PARTICIPATION OZCZO:77735}  Participation Quality:  {BHH PARTICIPATION QUALITY:22265}  Affect:  {BHH AFFECT:22266}  Cognitive:  {BHH COGNITIVE:22267}  Insight: {BHH Insight2:20797}  Engagement in Group:  {BHH ENGAGEMENT IN HMNLE:77731}  Modes of Intervention:  {BHH MODES OF INTERVENTION:22269}  Additional Comments:  ***  [name] 09/15/2024, 8:48 PM

## 2024-09-15 NOTE — Group Note (Signed)
 LCSW Group Therapy Note   Group Date: 09/15/2024 Start Time: 1300 End Time: 1400   Participation:  did not attend  Type of Therapy:  Group Therapy  Topic:  Shining from Within:  Confidence and Self-Love Journey.  Objective:  Promote Self-Awareness and Realistic Self-Talk: Help participants recognize their strengths and replace negative thoughts with truthful, realistic statements to build confidence.  Goals: Increase Confidence: Help participants develop a positive self-image by focusing on their strengths and personal progress. Set Achievable Goals: Guide participants in creating small, realistic goals that foster a sense of accomplishment and build momentum. Enhance Self-Care Practices: Encourage participants to incorporate self-care activities into their routine to support emotional well-being and reinforce confidence.  Summary:  The Shining from Within: Confidence and Self-Love Journey group helped individuals build stronger self-confidence through truthful, realistic self-talk, goal-setting, and self-care practices. Participants recognized their unique strengths, set achievable goals, and nurtured a supportive environment for mutual growth. The group provided practical tools to foster lasting confidence and self-belief, empowering each member to shine from within and embrace their personal power with greater self-assurance.  Therapeutic Modalities:   Cognitive Behavioral Therapy (CBT): Identify and reframe negative self-talk into realistic, positive thoughts Strengths-Based Therapy: Focus on personal strengths, successes, and abilities Psychoeducation: Teach concepts of self-esteem, confidence, and healthy self-talk   Catherene MALVA Dynes, LCSWA 09/15/2024  2:21 PM

## 2024-09-15 NOTE — Plan of Care (Signed)
" °  Problem: Education: Goal: Emotional status will improve Outcome: Progressing   Problem: Activity: Goal: Interest or engagement in activities will improve Outcome: Progressing Goal: Sleeping patterns will improve Outcome: Progressing   Problem: Activity: Goal: Sleeping patterns will improve Outcome: Progressing   Problem: Education: Goal: Mental status will improve Outcome: Not Progressing   "

## 2024-09-15 NOTE — BHH Group Notes (Signed)
 Adult Psychoeducational Group Note  Date:  09/15/2024 Time:  10:43 AM  Group Topic/Focus: Recreation Group   Participation Level:  Did Not Attend    [name] Ruth 09/15/2024, 10:43 AM

## 2024-09-15 NOTE — Group Note (Signed)
 Date:  09/15/2024 Time:  11:02 AM  Group Topic/Focus:  Recreational Therapy    Participation Level:  Active   [name] 09/15/2024, 11:02 AM
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
## 2024-09-15 NOTE — BH IP Treatment Plan (Signed)
 Interdisciplinary Treatment and Diagnostic Plan Update  09/15/2024 Time of Session: 10:50 am [name] MRN: [id number]  Principal Diagnosis: Schizoaffective disorder, bipolar type (HCC)  Secondary Diagnoses: Principal Problem:   Schizoaffective disorder, bipolar type (HCC)   Current Medications:  Current Facility-Administered Medications  Medication Dose Route Frequency Provider Last Rate Last Admin   albuterol  (VENTOLIN  HFA) 108 (90 Base) MCG/ACT inhaler 1-2 puff  1-2 puff Inhalation Q6H PRN Lenard Calin, MD       cloZAPine  (CLOZARIL ) tablet 25 mg  25 mg Oral Daily Lenard Calin, MD   25 mg at 09/15/24 1315   cloZAPine  (CLOZARIL ) tablet 50 mg  50 mg Oral QHS Lenard Calin, MD   50 mg at 09/14/24 2041   divalproex  (DEPAKOTE  ER) 24 hr tablet 750 mg  750 mg Oral Q12H Lenard Calin, MD   750 mg at 09/15/24 0913   hydrOXYzine  (ATARAX ) tablet 25 mg  25 mg Oral TID PRN Bobbitt, Shalon E, NP   25 mg at 09/14/24 2041   ibuprofen  (ADVIL ) tablet 800 mg  800 mg Oral Q8H PRN Lenard Calin, MD       LORazepam  (ATIVAN ) tablet 1 mg  1 mg Oral Daily PRN Onuoha, Chinwendu V, NP       Or   LORazepam  (ATIVAN ) injection 1 mg  1 mg Intramuscular Daily PRN Onuoha, Chinwendu V, NP       OLANZapine  zydis (ZYPREXA ) disintegrating tablet 5 mg  5 mg Oral TID PRN Onuoha, Chinwendu V, NP   5 mg at 09/12/24 2050   polyethylene glycol (MIRALAX  / GLYCOLAX ) packet 17 g  17 g Oral Daily PRN Lenard Calin, MD       senna (SENOKOT) tablet 8.6 mg  1 tablet Oral QHS PRN Lenard Calin, MD       traZODone  (DESYREL ) tablet 50 mg  50 mg Oral QHS PRN Bobbitt, Shalon E, NP   50 mg at 09/14/24 2041   PTA Medications: Medications Prior to Admission  Medication Sig Dispense Refill Last Dose/Taking   albuterol  (VENTOLIN  HFA) 108 (90 Base) MCG/ACT inhaler Inhale 1-2 puffs into the lungs every 6 (six) hours as needed for wheezing or shortness of breath.      ascorbic acid  (VITAMIN C) 500 MG tablet Take 1 tablet (500  mg total) by mouth 2 (two) times daily. (Patient not taking: Reported on 09/11/2024) 60 tablet 0    budesonide -formoterol  (SYMBICORT ) 160-4.5 MCG/ACT inhaler Inhale 2 puffs into the lungs daily. (Patient not taking: Reported on 09/11/2024) 1 each 12    CAPLYTA 42 MG capsule Take 42 mg by mouth daily.      divalproex  (DEPAKOTE  ER) 500 MG 24 hr tablet Take 2 tablets (1,000 mg total) by mouth at bedtime. (Patient not taking: Reported on 09/11/2024) 60 tablet 0    fluticasone  (FLONASE ) 50 MCG/ACT nasal spray Place 1 spray into both nostrils daily. (Patient not taking: Reported on 09/11/2024) 18.2 mL 2    fluticasone  (FLONASE ) 50 MCG/ACT nasal spray Place 1 spray into both nostrils daily. (Patient not taking: Reported on 09/11/2024) 11.1 mL 0    ibuprofen  (ADVIL ) 800 MG tablet Take 1 tablet (800 mg total) by mouth every 8 (eight) hours as needed for moderate pain. (Patient not taking: Reported on 09/11/2024) 21 tablet 0    metFORMIN  (GLUCOPHAGE ) 500 MG tablet Take 1 tablet (500 mg total) by mouth daily with breakfast. (Patient not taking: Reported on 09/11/2024) 30 tablet 0    ondansetron  (ZOFRAN -ODT) 4 MG disintegrating tablet Take 1  tablet (4 mg total) by mouth every 8 (eight) hours as needed for nausea or vomiting. (Patient not taking: Reported on 09/11/2024) 20 tablet 0    paliperidone  (INVEGA  SUSTENNA) 234 MG/1.5ML injection Inject 234 mg into the muscle every 28 (twenty-eight) days. (Patient not taking: Reported on 09/11/2024) 1.5 mL 0    traZODone  (DESYREL ) 50 MG tablet Take 1 tablet (50 mg total) by mouth at bedtime as needed for sleep. (Patient not taking: Reported on 09/11/2024) 30 tablet 0     Patient Stressors: Health problems   Medication change or noncompliance    Patient Strengths: Ability for insight  Communication skills   Treatment Modalities: Medication Management, Group therapy, Case management,  1 to 1 session with clinician, Psychoeducation, Recreational therapy.   Physician Treatment  Plan for Primary Diagnosis: Schizoaffective disorder, bipolar type (HCC) Long Term Goal(s): Improvement in symptoms so as ready for discharge   Short Term Goals: Ability to identify changes in lifestyle to reduce recurrence of condition will improve Ability to identify and develop effective coping behaviors will improve Compliance with prescribed medications will improve Ability to identify triggers associated with substance abuse/mental health issues will improve  Medication Management: Evaluate patient's response, side effects, and tolerance of medication regimen.  Therapeutic Interventions: 1 to 1 sessions, Unit Group sessions and Medication administration.  Evaluation of Outcomes: Not Progressing  Physician Treatment Plan for Secondary Diagnosis: Principal Problem:   Schizoaffective disorder, bipolar type (HCC)  Long Term Goal(s): Improvement in symptoms so as ready for discharge   Short Term Goals: Ability to identify changes in lifestyle to reduce recurrence of condition will improve Ability to identify and develop effective coping behaviors will improve Compliance with prescribed medications will improve Ability to identify triggers associated with substance abuse/mental health issues will improve     Medication Management: Evaluate patient's response, side effects, and tolerance of medication regimen.  Therapeutic Interventions: 1 to 1 sessions, Unit Group sessions and Medication administration.  Evaluation of Outcomes: Not Progressing   RN Treatment Plan for Primary Diagnosis: Schizoaffective disorder, bipolar type (HCC) Long Term Goal(s): Knowledge of disease and therapeutic regimen to maintain health will improve  Short Term Goals: Ability to remain free from injury will improve, Ability to verbalize frustration and anger appropriately will improve, Ability to demonstrate self-control, Ability to participate in decision making will improve, Ability to verbalize feelings  will improve, Ability to disclose and discuss suicidal ideas, Ability to identify and develop effective coping behaviors will improve, and Compliance with prescribed medications will improve  Medication Management: RN will administer medications as ordered by provider, will assess and evaluate patient's response and provide education to patient for prescribed medication. RN will report any adverse and/or side effects to prescribing provider.  Therapeutic Interventions: 1 on 1 counseling sessions, Psychoeducation, Medication administration, Evaluate responses to treatment, Monitor vital signs and CBGs as ordered, Perform/monitor CIWA, COWS, AIMS and Fall Risk screenings as ordered, Perform wound care treatments as ordered.  Evaluation of Outcomes: Not Progressing   LCSW Treatment Plan for Primary Diagnosis: Schizoaffective disorder, bipolar type (HCC) Long Term Goal(s): Safe transition to appropriate next level of care at discharge, Engage patient in therapeutic group addressing interpersonal concerns.  Short Term Goals: Engage patient in aftercare planning with referrals and resources, Increase social support, Increase ability to appropriately verbalize feelings, Increase emotional regulation, Facilitate acceptance of mental health diagnosis and concerns, Facilitate patient progression through stages of change regarding substance use diagnoses and concerns, Identify triggers associated with mental health/substance abuse issues,  and Increase skills for wellness and recovery  Therapeutic Interventions: Assess for all discharge needs, 1 to 1 time with Social worker, Explore available resources and support systems, Assess for adequacy in community support network, Educate family and significant other(s) on suicide prevention, Complete Psychosocial Assessment, Interpersonal group therapy.  Evaluation of Outcomes: Not Progressing   Progress in Treatment: Attending groups: Patient is attending some  groups.  Participating in groups: Yes. Taking medication as prescribed: Yes. Toleration medication: Yes. Family/Significant other contact made: No, will contact:  [name] (daughter) ph: [phone]. Patient understands diagnosis: Yes. Discussing patient identified problems/goals with staff: Yes. Medical problems stabilized or resolved: Yes. Denies suicidal/homicidal ideation: Yes. Issues/concerns per patient self-inventory: No. None reported.  New problem(s) identified: No, Describe:  None identified.  New Short Term/Long Term Goal(s): medication stabilization, elimination of SI thoughts, development of comprehensive mental wellness plan.    Patient Goals:  UTA, patient presented as disorganized with tangential speech.   Discharge Plan or Barriers: Patient recently admitted. CSW will continue to follow and assess for appropriate referrals and possible discharge planning.    Reason for Continuation of Hospitalization: Delusions  Mania Medication stabilization Other; describe : disorganized   Estimated Length of Stay: 5-7 days.  Last 3 [place] Suicide Severity Risk Score: Flowsheet Row [facility]Current[facility]20[facility]ad[facility] (Canceled[facility]  C-SSRS RISK CATEGORY No Risk No Risk No Risk    Last PHQ 2/9 Scores:    02/23/2021    8:30 AM 09/16/2020    1:00 PM 10/24/2016    3:24 PM  Depression screen PHQ 2/9  Decreased Interest 1 0 1  Down, Depressed, Hopeless 1 0 1  PHQ - 2 Score 2 0 2  Altered sleeping 2 1 3   Tired, decreased energy 0 0 1  Change in appetite 0 0 0  Feeling bad or failure about yourself  1 0 1  Trouble concentrating 0 0 0  Moving slowly or fidgety/restless 0 0 3  Suicidal thoughts 1 0 1   PHQ-9 Score 6  1  11       Data saved with a previous flowsheet row  definition    Scribe for Treatment Team: Ryian Lynde  Nunez-Uva, LCSWA 09/15/2024 2:11 PM

## 2024-09-15 NOTE — Group Note (Signed)
 Date:  09/15/2024 Time:  12:54 PM  Group Topic/Focus:  Emotional Education:   The focus of this group is to discuss what feelings/emotions are, and how they are experienced.    Participation Level:  Did Not Attend   Yvette Yvette Molly 09/15/2024, 12:54 PM

## 2024-09-15 NOTE — Group Note (Signed)
 Date:  09/15/2024 Time:  2:22 PM  Group Topic/Focus:  Social Work Group    Participation Level:  Did Not Attend   Yvette Yvette Molly 09/15/2024, 2:22 PM

## 2024-09-15 NOTE — Group Note (Signed)
 Date:  09/15/2024 Time:  9:40 PM  Group Topic/Focus: Wrap-Up Group:   The focus of this group is to help patients review their daily goal of treatment and discuss progress on daily workbooks.     Participation Level:  Active  Participation Quality:  Appropriate  Affect:  Appropriate  Cognitive:  Disorganized  Insight: Limited  Engagement in Group:  Engaged  Modes of Intervention:  Education  Additional Comments:  Patient attended and participated in group tonight.  [name] 09/15/2024, 9:40 PM

## 2024-09-15 NOTE — Progress Notes (Signed)
(  Sleep Hours) -8.5  (Any PRNs that were needed, meds refused, or side effects to meds)- Vistaril  and Trazodone  per [facility]  (Any disturbances and when (visitation, over night)-none  (Concerns raised by the patient)- none, pt continues to be disorganized and delusional but pleasant on the unit  (SI/HI/AVH)-denies

## 2024-09-15 NOTE — Progress Notes (Signed)
 Refused labs, stated I have no more blood in my body to give

## 2024-09-15 NOTE — Progress Notes (Cosign Needed Addendum)
 [facility] Inpatient Psychiatry Progress Note  Date: 09/15/24 Patient: [name] MRN: [id number]  Assessment and Plan: [name] is a 54 y.o. female with a past psychiatric schizoaffective bipolar type, transported to the [facility] emergency department for disorganized thoughts, paranoia and refusal to take medications. She was petitioned for IVC by her ACT Team. She was transferred to the [facility] for symptomatic stabilization and medication management.  Assessment this morning, patient continues to be disorganized in thought content, saying nonsensical things and word salad that. Patient has received several doses of clozapine  and will give a daytime dose to help address psychosis.  She denies any concerning review of systems based on clozapine  dosage.  Continue Depakote  at current doses given patient not exhibiting any excess excessive manic behavior, she is calm, cooperative and has not had any behavioral outburst while on the unit. Continue to assess patient daily and attempt to get in contact with her ACT team this week to discuss medications.   # Schizoaffective disorder, bipolar type (HCC) -- Continue Depakote  750 mg every 12 hours for bipolar component of schizoaffective disorder  -- Increase dose Clozapine  25 mg qAM and 50 mg at bedtime for schizoaffective disorder, will continue to adjust dosage based on daily assessments  -- Metabolic profile and EKG monitoring obtained while on an atypical antipsychotic  (BMI: 35.55  Lipid Panel: LDL 109  HbgA1c: 0.6  QTc: 459 -- Clozapine  labs ordered include: CBC WNL , troponins Neg, proBNP negative - CK 107 and CK-MB 1.8  - CRP patient refused labs  - TSH WNL - RPR Neg  Risk Assessment - Mild: Patient with a prior history of suicidal ideations and suicide attempt, patient not currently endorsing any suicidal thoughts, however does not have great executive function given current  psychiatric decompensation.  Patient also noncompliant on medications which puts her at an increased risk of self-harm   Discharge Planning Barriers to discharge: Medication compliance, Safety planning, contact with ACT Team  Estimated length of stay: 7 days  Predicted Discharge location: Home   Interval History and update:  No acute events overnight.  Patient compliant with all scheduled medications. No agitation prns administered.  Attendance in groups is very limited, she is not going to majority of groups.   Patient was lying calmly in her bedroom upon assessment, patient was able to have some moments of linearity during my assessment.  She denied depression, anxiety, suicidal ideations, homicidal ideation or auditory visual hallucinations.  She still continues to exhibit disorganized thought, word salad and was tangential with responses.  Continues to report that ACT team members had her mistaken and thought that she needed medications.  She reports that does not want to talk with ACT team members and only wants to speak with the nurse or her doctor regarding her medications.  Physical Exam MSK/Neuro -patient is alert to person, self, place no focal deficits noted on observation Mental Status Exam Appearance - Casually dressed, disheveled Attitude - Calm, polite, not guarded Speech -fast prosody, inflection Mood -okay Affect -congruent Thought Process -disorganized, word salad, tangential  Thought Content -delusions of persecution towards ACT Team , illogical, TC expressed  SI/HI - Denies  Perceptions - Denies AVH; not RIS Judgement/Insight - Impaired  Fund of knowledge - Lacking  Language - No impairments  Lab Results:  Admission on 09/12/2024  Component Date Value Ref Range Status   Total CK 09/14/2024 107  38 - 234 U/L Final   CK, MB  09/14/2024 1.8  0.5 - 5.0 ng/mL Final   Pro Brain Natriuretic Peptide 09/14/2024 <50.0  <300.0 pg/mL Final   Hgb A1c MFr Bld 09/14/2024 5.6   4.8 - 5.6 % Final   Mean Plasma Glucose 09/14/2024 114.02  mg/dL Final   TSH 98/81/7973 0.713  0.350 - 4.500 uIU/mL Final   Cholesterol 09/14/2024 170  0 - 200 mg/dL Final   Triglycerides 98/81/7973 67  <150 mg/dL Final   HDL 98/81/7973 47  >40 mg/dL Final   Total CHOL/HDL Ratio 09/14/2024 3.6  RATIO Final   VLDL 09/14/2024 13  0 - 40 mg/dL Final   LDL Cholesterol 09/14/2024 109 (H)  0 - 99 mg/dL Final   RPR Ser Ql 98/81/7973 NON REACTIVE  NON REACTIVE Final   WBC 09/14/2024 4.8  4.0 - 10.5 K/uL Final   RBC 09/14/2024 4.16  3.87 - 5.11 MIL/uL Final   Hemoglobin 09/14/2024 12.5  12.0 - 15.0 g/dL Final   HCT 98/81/7973 37.6  36.0 - 46.0 % Final   MCV 09/14/2024 90.4  80.0 - 100.0 fL Final   MCH 09/14/2024 30.0  26.0 - 34.0 pg Final   MCHC 09/14/2024 33.2  30.0 - 36.0 g/dL Final   RDW 98/81/7973 12.9  11.5 - 15.5 % Final   Platelets 09/14/2024 255  150 - 400 K/uL Final   nRBC 09/14/2024 0.0  0.0 - 0.2 % Final   Neutrophils Relative % 09/14/2024 38  % Final   Neutro Abs 09/14/2024 1.8  1.7 - 7.7 K/uL Final   Lymphocytes Relative 09/14/2024 47  % Final   Lymphs Abs 09/14/2024 2.3  0.7 - 4.0 K/uL Final   Monocytes Relative 09/14/2024 7  % Final   Monocytes Absolute 09/14/2024 0.3  0.1 - 1.0 K/uL Final   Eosinophils Relative 09/14/2024 7  % Final   Eosinophils Absolute 09/14/2024 0.3  0.0 - 0.5 K/uL Final   Basophils Relative 09/14/2024 1  % Final   Basophils Absolute 09/14/2024 0.0  0.0 - 0.1 K/uL Final   Immature Granulocytes 09/14/2024 0  % Final   Abs Immature Granulocytes 09/14/2024 0.01  0.00 - 0.07 K/uL Final   Troponin T High Sensitivity 09/14/2024 <15  0 - 19 ng/L Final    Vitals: Blood pressure 111/82, pulse (!) 107, temperature 97.9 F (36.6 C), temperature source Oral, resp. rate 16, height 5' 7 (1.702 m), weight 103 kg, SpO2 100%.    Nikya Busler, MD [facility] psychiatry resident PGY 2

## 2024-09-15 NOTE — BHH Group Notes (Signed)
 Adult Psychoeducational Group Note  Date:  09/15/2024 Time:  10:30 AM  Group Topic/Focus: Orentation Group Orientation:   The focus of this group is to educate the patient on the purpose and policies of crisis stabilization and provide a format to answer questions about their admission.  The group details unit policies and expectations of patients while admitted.  Participation Level:  Did Not Attend    Naida Rome Ruth 09/15/2024, 10:30 AM

## 2024-09-15 NOTE — Progress Notes (Signed)
 Recreation Therapy Notes  INPATIENT RECREATION THERAPY ASSESSMENT  Patient Details Name: [name] MRN: [id number] DOB: 08/19/1971 Today's Date: 09/15/2024       Information Obtained From: Patient  Able to Participate in Assessment/Interview: Yes (Patient would ramble about the police and what they look for such as weapons and people. Patient also talked about how the police shut places down.)  Patient Presentation: Alert (Hyperverbal)  Reason for Admission (Per Patient):  (I don't know)  Patient Stressors: Other (Comment) (none, regular stress everyone goes through)  Coping Skills:   Journal, Sports, TV, Music, Exercise, Talk, Prayer, Avoidance, Read, Hot Bath/Shower  Leisure Interests (2+):  Individual - Other (Comment), Community - Grocery store (perform tasks)  Frequency of Recreation/Participation: Other (Comment) (Didn't give an answer)  Awareness of Community Resources:  Yes  Community Resources:  Library, Other (Comment) Training And Development Officer)  Current Use: Yes  If no, Barriers?:    Expressed Interest in [employer] Information: No  [employer] of Residence:  Engineer, Technical Sales  Patient Main Form of Transportation: Therapist, Music (Walk, Pharmacist, Community)  Patient Strengths:  usually get another job if needed; I tell them to fire me then say I was just joking but they will say but you told us  to fire you so I'm fired  Patient Identified Areas of Improvement:  staying home; reading Bible  Patient Goal for Hospitalization:  not at this time, go home and rest  Current SI (including self-harm):  No  Current HI:  No  Current AVH: No  Staff Intervention Plan: Group Attendance, Collaborate with Interdisciplinary Treatment Team  Consent to Intern Participation: N/A   Marcos Ruelas-McCall, LRT,CTRS Bader Stubblefield A Lolitha Tortora-McCall 09/15/2024, 2:26 PM

## 2024-09-15 NOTE — Progress Notes (Signed)
(  Sleep Hours) - 9.5 (Any PRNs that were needed, meds refused, or side effects to meds)- hydroxyzine , trazodone  (Any disturbances and when (visitation, over night)-none reported (Concerns raised by the patient)- none reported (SI/HI/AVH)- Denies all

## 2024-09-15 NOTE — Group Note (Signed)
 Recreation Therapy Group Note   Group Topic:Problem Solving  Group Date: 09/15/2024 Start Time: 1032 End Time: 1055 Facilitators: Akiba Melfi-McCall, LRT,CTRS Location: [address] Dayroom   Group Topic: Communication, Team Building, Problem Solving  Goal Area(s) Addresses:  Patient will effectively work with peer towards shared goal.  Patient will identify skills used to make activity successful.  Patient will identify how skills used during activity can be used to reach post d/c goals.   Behavioral Response: Engaged  Intervention: STEM Activity  Activity: Straw Bridge. In teams of 3-5, patients were given 15 plastic drinking straws and an equal length of masking tape. Using the materials provided, patients were instructed to build a free standing bridge-like structure to suspend an everyday item (ex: puzzle box) off of the floor or table surface. All materials were required to be used by the team in their design. LRT facilitated post-activity discussion reviewing team process. Patients were encouraged to reflect how the skills used in this activity can be generalized to daily life post discharge.   Education: Pharmacist, Community, Scientist, Physiological, Discharge Planning   Education Outcome: Acknowledges education/In group clarification offered/Needs additional education.    Affect/Mood: Appropriate   Participation Level: Engaged   Participation Quality: Independent   Behavior: Appropriate   Speech/Thought Process: Focused   Insight: Moderate   Judgement: Moderate   Modes of Intervention: STEM Activity   Patient Response to Interventions:  Engaged   Education Outcome:  In group clarification offered    Clinical Observations/Individualized Feedback: Pt was focused and engaged during activity. Pt worked well with peer and gave the most input on how to design and build their bridge. Pt was appropriate and on topic throughout group session.      Plan: Continue to engage  patient in RT group sessions 2-3x/week.   Kenzel Ruesch-McCall, LRT,CTRS  09/15/2024 1:56 PM

## 2024-09-15 NOTE — Group Note (Unsigned)
 Date:  09/15/2024 Time:  9:12 PM  Group Topic/Focus:  Wrap-Up Group:   The focus of this group is to help patients review their daily goal of treatment and discuss progress on daily workbooks.     Participation Level:  {BHH PARTICIPATION OZCZO:77735}  Participation Quality:  {BHH PARTICIPATION QUALITY:22265}  Affect:  {BHH AFFECT:22266}  Cognitive:  {BHH COGNITIVE:22267}  Insight: {BHH Insight2:20797}  Engagement in Group:  {BHH ENGAGEMENT IN HMNLE:77731}  Modes of Intervention:  {BHH MODES OF INTERVENTION:22269}  Additional Comments:  ***  [name] 09/15/2024, 9:12 PM

## 2024-09-16 DIAGNOSIS — F250 Schizoaffective disorder, bipolar type: Secondary | ICD-10-CM | POA: Diagnosis not present

## 2024-09-16 MED ORDER — DIVALPROEX SODIUM ER 500 MG PO TB24
500.0000 mg | ORAL_TABLET | Freq: Two times a day (BID) | ORAL | Status: DC
  Administered 2024-09-17 – 2024-09-19 (×5): 500 mg via ORAL
  Filled 2024-09-16 (×5): qty 1

## 2024-09-16 MED ORDER — DIVALPROEX SODIUM ER 500 MG PO TB24
750.0000 mg | ORAL_TABLET | Freq: Two times a day (BID) | ORAL | Status: AC
  Administered 2024-09-16: 750 mg via ORAL
  Filled 2024-09-16: qty 1

## 2024-09-16 MED ORDER — CLOZAPINE 25 MG PO TABS
75.0000 mg | ORAL_TABLET | Freq: Every day | ORAL | Status: DC
  Administered 2024-09-16 – 2024-09-17 (×2): 75 mg via ORAL
  Filled 2024-09-16 (×2): qty 3

## 2024-09-16 NOTE — BHH Group Notes (Signed)
 Adult Psychoeducational Group Note  Date:  09/16/2024 Time:  1:25 PM  Group Topic/Focus:  Recreation therapy  Participation Level:  Active  Participation Quality:    Affect:    Cognitive:    Insight:   Engagement in Group:    Modes of Intervention:    Additional Comments:    Paula Busenbark O 09/16/2024, 1:25 PM

## 2024-09-16 NOTE — Group Note (Signed)
 Recreation Therapy Group Note   Group Topic:Team Building  Group Date: 09/16/2024 Start Time: 1042 End Time: 1109 Facilitators: Taquan Bralley-McCall, LRT,CTRS Location: 500 [address] Dayroom   Group Topic: Communication, Team Building, Problem Solving  Goal Area(s) Addresses:  Patient will effectively work with peer towards shared goal.  Patient will identify skills used to make activity successful.  Patient will identify how skills used during activity can be used to reach post d/c goals.   Behavioral Response:   Intervention: Teambuilding Activity  Activity: [name]. Working as a administrator, civil service, patients were asked to use colored discs to get the entire team from one end of the hall way to the other. Patients were only allowed to move down and back the hallway by stepping on the discs, patient team was provided 1 additional disc to assist with them completing task.    Education: Pharmacist, Community, Scientist, Physiological, Discharge Planning   Education Outcome: Acknowledges education.    Affect/Mood: N/A   Participation Level: Did not attend    Clinical Observations/Individualized Feedback:      Plan: Continue to engage patient in RT group sessions 2-3x/week.   Aza Dantes-McCall, LRT,CTRS 09/16/2024 2:12 PM

## 2024-09-16 NOTE — BHH Group Notes (Signed)
 Adult Psychoeducational Group Note  Date:  09/16/2024 Time:  4:39 PM  Group Topic/Focus: Emotional Wellness  Participation Level:  Active  Participation Quality:   Affect:    Cognitive:    Insight:   Engagement in Group:    Modes of Intervention:    Additional Comments:    Gerrod Maule O 09/16/2024, 4:39 PM

## 2024-09-16 NOTE — Progress Notes (Signed)
 [facility] Inpatient Psychiatry Progress Note  Date: 09/16/24 Patient: [name] MRN: [id number]  Assessment and Plan: [name] is a 54 y.o. female with a past psychiatric schizoaffective bipolar type, transported to the [facility] emergency department for disorganized thoughts, paranoia and refusal to take medications. She was petitioned for IVC by her ACT Team. She was transferred to the [facility] for symptomatic stabilization and medication management.  Assessment this morning,patient with some mild improvements in word salad, disorganization still present and delusional thought content. Will consolidate evening dose to decrease likelihood of day time sedation. Patient also receiving prn sleep aids and will discontinue trazodone  and encouraged nursing staff to hold other sleep aids. She denies any concerning review of systems based on clozapine  dosage. Decreased Depakote  givne improvement in manic like symptoms. Continue to assess patient daily and attempt to get in contact with her ACT team this week to discuss medications.  Safety planning conducted with the patient's daughter.  Social work, appreciate their efforts   # Schizoaffective disorder, bipolar type (HCC) -- Decrease Depakote  500 mg every 12 hours for bipolar component of schizoaffective disorder, given improvement in acute manic like behavior -- Consolidate total Clozapine  75 mg at bedtime for schizoaffective disorder, given sedation effects  -- Metabolic profile and EKG monitoring obtained while on an atypical antipsychotic  (BMI: 35.55  Lipid Panel: LDL 109  HbgA1c: 0.6  QTc: 459 -- Clozapine  labs ordered include: CBC WNL , troponins Neg, proBNP negative - CK 107 and CK-MB 1.8  - CRP patient refused labs  - TSH WNL - RPR Neg  Risk Assessment - Mild: Patient with a prior history of suicidal ideations and suicide attempt, patient not currently endorsing any suicidal  thoughts, however does not have great executive function given current psychiatric decompensation.  Patient also noncompliant on medications which puts her at an increased risk of self-harm   Discharge Planning Barriers to discharge: Medication compliance, Safety planning, contact with ACT Team  Estimated length of stay: 7 days  Predicted Discharge location: Home   Interval History and update:  No acute events overnight.  Patient compliant with all scheduled medications. No agitation prns administered.Pt received trazodone  and hydroxyzine  at bedtime.  Attendance in groups limited, patient sedated on current regimen per nursing.    Patient was lying calmly in her bedroom upon assessment, patient was able to have some moments of linearity during my assessment.  She denied depression, anxiety, suicidal ideations, homicidal ideation or auditory visual hallucinations.  She still continues to exhibit disorganized thought and with less tangential with responses. She denies any issues with BM and has continued to be regular. Consented to discussing with daughter and she believes she may be upset with her.   Physical Exam MSK/Neuro -patient is alert to person, self, place no focal deficits noted on observation Mental Status Exam Appearance - Casually dressed, disheveled Attitude - Calm, polite, not guarded Speech -normal prosody, inflection Mood -okay Affect -congruent Thought Process -disorganized  Thought Content - delusions of persecution towards ACT Team, illogical, TC expressed  SI/HI - Denies  Perceptions - Denies AVH; not RIS Judgement/Insight - Impaired  Fund of knowledge - Lacking  Language - No impairments  Lab Results:  Admission on 09/12/2024  Component Date Value Ref Range Status   Total CK 09/14/2024 107  38 - 234 U/L Final   CK, MB 09/14/2024 1.8  0.5 - 5.0 ng/mL Final   Pro Brain Natriuretic Peptide 09/14/2024 <50.0  <300.0  pg/mL Final   Hgb A1c MFr Bld 09/14/2024 5.6  4.8 -  5.6 % Final   Mean Plasma Glucose 09/14/2024 114.02  mg/dL Final   TSH 98/81/7973 0.713  0.350 - 4.500 uIU/mL Final   Cholesterol 09/14/2024 170  0 - 200 mg/dL Final   Triglycerides 98/81/7973 67  <150 mg/dL Final   HDL 98/81/7973 47  >40 mg/dL Final   Total CHOL/HDL Ratio 09/14/2024 3.6  RATIO Final   VLDL 09/14/2024 13  0 - 40 mg/dL Final   LDL Cholesterol 09/14/2024 109 (H)  0 - 99 mg/dL Final   RPR Ser Ql 98/81/7973 NON REACTIVE  NON REACTIVE Final   WBC 09/14/2024 4.8  4.0 - 10.5 K/uL Final   RBC 09/14/2024 4.16  3.87 - 5.11 MIL/uL Final   Hemoglobin 09/14/2024 12.5  12.0 - 15.0 g/dL Final   HCT 98/81/7973 37.6  36.0 - 46.0 % Final   MCV 09/14/2024 90.4  80.0 - 100.0 fL Final   MCH 09/14/2024 30.0  26.0 - 34.0 pg Final   MCHC 09/14/2024 33.2  30.0 - 36.0 g/dL Final   RDW 98/81/7973 12.9  11.5 - 15.5 % Final   Platelets 09/14/2024 255  150 - 400 K/uL Final   nRBC 09/14/2024 0.0  0.0 - 0.2 % Final   Neutrophils Relative % 09/14/2024 38  % Final   Neutro Abs 09/14/2024 1.8  1.7 - 7.7 K/uL Final   Lymphocytes Relative 09/14/2024 47  % Final   Lymphs Abs 09/14/2024 2.3  0.7 - 4.0 K/uL Final   Monocytes Relative 09/14/2024 7  % Final   Monocytes Absolute 09/14/2024 0.3  0.1 - 1.0 K/uL Final   Eosinophils Relative 09/14/2024 7  % Final   Eosinophils Absolute 09/14/2024 0.3  0.0 - 0.5 K/uL Final   Basophils Relative 09/14/2024 1  % Final   Basophils Absolute 09/14/2024 0.0  0.0 - 0.1 K/uL Final   Immature Granulocytes 09/14/2024 0  % Final   Abs Immature Granulocytes 09/14/2024 0.01  0.00 - 0.07 K/uL Final   Troponin T High Sensitivity 09/14/2024 <15  0 - 19 ng/L Final    Vitals: Blood pressure 107/76, pulse (!) 101, temperature 97.9 F (36.6 C), temperature source Oral, resp. rate 16, height 5' 7 (1.702 m), weight 103 kg, SpO2 100%.    Hawkins Seaman, MD [facility] psychiatry resident PGY 2

## 2024-09-16 NOTE — BHH Group Notes (Signed)
 Adult Psychoeducational Group Note  Date:  09/16/2024 Time:  4:40 PM  Group Topic/Focus: Pharmacy Group  Participation Level:  Active  Participation Quality:    Affect:    Cognitive:    Insight:   Engagement in Group:    Modes of Intervention:    Additional Comments:    Maricela Schreur O 09/16/2024, 4:40 PM

## 2024-09-16 NOTE — Plan of Care (Signed)
  Problem: Education: Goal: Emotional status will improve Outcome: Progressing   Problem: Activity: Goal: Interest or engagement in activities will improve Outcome: Progressing Goal: Sleeping patterns will improve Outcome: Progressing   Problem: Safety: Goal: Periods of time without injury will increase Outcome: Progressing   Problem: Education: Goal: Mental status will improve Outcome: Not Progressing

## 2024-09-16 NOTE — Plan of Care (Signed)
  Problem: Nutritional: Goal: Ability to achieve adequate nutritional intake will improve Outcome: Progressing   Problem: Safety: Goal: Ability to remain free from injury will improve Outcome: Progressing

## 2024-09-16 NOTE — BHH Group Notes (Signed)
 Adult Psychoeducational Group Note  Date:  09/16/2024 Time:  1:24 PM  Group Topic/Focus:  Goals Group:   The focus of this group is to help patients establish daily goals to achieve during treatment and discuss how the patient can incorporate goal setting into their daily lives to aide in recovery. Orientation:   The focus of this group is to educate the patient on the purpose and policies of crisis stabilization and provide a format to answer questions about their admission.  The group details unit policies and expectations of patients while admitted.  Participation Level:  Active  Participation Quality:  Appropriate  Affect:  Appropriate  Cognitive:  Appropriate  Insight: Appropriate  Engagement in Group:  Engaged  Modes of Intervention:  Discussion  Additional Comments:  Pt attended the goals group and remained appropriate and engaged throughout the duration of the group.   Lakely Elmendorf O 09/16/2024, 1:24 PM

## 2024-09-16 NOTE — Progress Notes (Signed)
(  Sleep Hours) -9.25  (Any PRNs that were needed, meds refused, or side effects to meds)- Vistaril  and Trazodone  per [facility]  (Any disturbances and when (visitation, over night)-none  (Concerns raised by the patient)- none  (SI/HI/AVH)-denies

## 2024-09-16 NOTE — BHH Suicide Risk Assessment (Signed)
 BHH INPATIENT:  Family/Significant Other Suicide Prevention Education  Suicide Prevention Education:  Education Completed; [name] (daughter) [phone],  (name of family member/significant other) has been identified by the patient as the family member/significant other with whom the patient will be residing, and identified as the person(s) who will aid the patient in the event of a mental health crisis (suicidal ideations/suicide attempt).  With written consent from the patient, the family member/significant other has been provided the following suicide prevention education, prior to the and/or following the discharge of the patient.  Daughter said that patient doesn't have access to firearms, and there are no firearms in the home.  Daughter said she can pick up patient from the hospital upon discharge.  Daughter said she visits patient at her home every day or every other day (they live 7 minutes from each other).  The suicide prevention education provided includes the following: Suicide risk factors Suicide prevention and interventions National Suicide Hotline telephone number [facility] assessment telephone number [facility] Emergency Assistance 911 [facility] and/or Residential Mobile Crisis Unit telephone number  Request made of family/significant other to: Remove weapons (e.g., guns, rifles, knives), all items previously/currently identified as safety concern.   Remove drugs/medications (over-the-counter, prescriptions, illicit drugs), all items previously/currently identified as a safety concern.  The family member/significant other verbalizes understanding of the suicide prevention education information provided.  The family member/significant other agrees to remove the items of safety concern listed above.  Yvette Mcweeney O Erasmus Bistline, LCSW 09/16/2024, 10:10 AM

## 2024-09-16 NOTE — Progress Notes (Incomplete)
 Collateral contact - [facility] Outpatient Office - Seth Ward, [facility], [address], [place], [place] [address], [phone]

## 2024-09-16 NOTE — Group Note (Signed)
 Date:  09/16/2024 Time:  8:57 PM  Group Topic/Focus:  Wrap-Up Group:   The focus of this group is to help patients review their daily goal of treatment and discuss progress on daily workbooks.    Participation Level:  Minimal  Participation Quality:  Intrusive and Redirectable  Affect:  Appropriate and Flat  Cognitive:  Disorganized, Confused, and Delusional  Insight: Limited  Engagement in Group:  Limited and Off Topic  Modes of Intervention:  Discussion and Socialization  Additional Comments:  Patient did not share one high point and low point from their day. When asked, patient became off topic and began to ramble about several different things.  Eward Mace 09/16/2024, 8:57 PM

## 2024-09-17 DIAGNOSIS — F250 Schizoaffective disorder, bipolar type: Secondary | ICD-10-CM | POA: Diagnosis not present

## 2024-09-17 NOTE — Group Note (Signed)
 Date:  09/17/2024 Time:  4:02 PM  Group Topic/Focus:  Mental Health Group     Participation Level:  Did Not Attend  Meg FORBES Ellen 09/17/2024, 4:02 PM

## 2024-09-17 NOTE — Group Note (Signed)
 Date:  09/17/2024 Time:  2:13 PM  Group Topic/Focus:  Early Warning Signs:   The focus of this group is to help patients identify signs or symptoms they exhibit before slipping into an unhealthy state or crisis. Emotional Education:   The focus of this group is to discuss what feelings/emotions are, and how they are experienced. Healthy Communication:   The focus of this group is to discuss communication, barriers to communication, as well as healthy ways to communicate with others.    Participation Level:  Did Not Attend   Meg FORBES Ellen 09/17/2024, 2:13 PM

## 2024-09-17 NOTE — Group Note (Signed)
 Date:  09/17/2024 Time:  9:35 AM  Group Topic/Focus:  Goals Group:   The focus of this group is to help patients establish daily goals to achieve during treatment and discuss how the patient can incorporate goal setting into their daily lives to aide in recovery. Orientation:   The focus of this group is to educate the patient on the purpose and policies of crisis stabilization and provide a format to answer questions about their admission.  The group details unit policies and expectations of patients while admitted.    Participation Level:  Did Not Attend  [name] 09/17/2024, 9:35 AM

## 2024-09-17 NOTE — Plan of Care (Signed)
  Problem: Education: Goal: Emotional status will improve Outcome: Progressing Goal: Mental status will improve Outcome: Not Progressing Goal: Verbalization of understanding the information provided will improve Outcome: Not Progressing

## 2024-09-17 NOTE — Progress Notes (Signed)
(  Sleep Hours) - (Any PRNs that were needed, meds refused, or side effects to meds)- none (Any disturbances and when (visitation, over night)- none (Concerns raised by the patient)- none (SI/HI/AVH)- denies

## 2024-09-17 NOTE — Group Note (Unsigned)
 Date:  09/17/2024 Time:  2:22 PM  Group Topic/Focus:  Early Warning Signs:   The focus of this group is to help patients identify signs or symptoms they exhibit before slipping into an unhealthy state or crisis. Emotional Education:   The focus of this group is to discuss what feelings/emotions are, and how they are experienced.     Participation Level:  {BHH PARTICIPATION OZCZO:77735}  Participation Quality:  {BHH PARTICIPATION QUALITY:22265}  Affect:  {BHH AFFECT:22266}  Cognitive:  {BHH COGNITIVE:22267}  Insight: {BHH Insight2:20797}  Engagement in Group:  {BHH ENGAGEMENT IN HMNLE:77731}  Modes of Intervention:  {BHH MODES OF INTERVENTION:22269}  Additional Comments:  ***  Artie Takayama L Shalina Norfolk 09/17/2024, 2:22 PM

## 2024-09-17 NOTE — Progress Notes (Addendum)
 [facility] Inpatient Psychiatry Progress Note  Date: 09/17/24 Patient: [name] MRN: [id number]  Assessment and Plan: [name] is a 54 y.o. female with a past psychiatric schizoaffective bipolar type, transported to the [facility] emergency department for disorganized thoughts, paranoia and refusal to take medications. She was petitioned for IVC by her ACT Team. She was transferred to the [facility] for symptomatic stabilization and medication management.  Assessment this morning, patient with some improvements in linearity with some flight of ideas and disorganization. Discontinued sleep aids (trazodone  and hydroxyzine ) to limit lingering oversedation. Collateral with IVC petitioner about patient's behavior stated at baseline the patient might have some fixed delusions, however would be compliant with medications and have less disorganized thought process. Reports that patient had been refusing medicines the past 2 months and was exhibiting decompensation of her schizophrenia.  Patient currently on clozapine , unable to confirm most recent medication regimen with ACT team nurse.  Will continue to attempt to obtain information, and can possibly consider dual antipsychotic therapy w/ hopeful transition  long-acting injectable given patient's stability previously versus continuing with clozapine  monotherapy for schizophrenia.    # Schizoaffective disorder, bipolar type (HCC) -- Continue Depakote  500 mg every 12 hours for bipolar component of schizoaffective disorder, given improvement in acute manic like behavior -- Consolidate total Clozapine  75 mg at bedtime for schizoaffective disorder, given sedation effects  -- Metabolic profile and EKG monitoring obtained while on an atypical antipsychotic  (BMI: 35.55  Lipid Panel: LDL 109  HbgA1c: 0.6  QTc: 459 -- Clozapine  labs ordered include: CBC WNL , troponins Neg, proBNP negative - CK 107 and  CK-MB 1.8  - CRP patient refused labs  - TSH WNL - RPR Neg  Risk Assessment - Mild: Patient with a prior history of suicidal ideations and suicide attempt, patient not currently endorsing any suicidal thoughts, however does not have great executive function given current psychiatric decompensation.  Patient also noncompliant on medications which puts her at an increased risk of self-harm   Discharge Planning Barriers to discharge: Medication compliance, Safety planning, contact with ACT Team  Estimated length of stay: 7 days  Predicted Discharge location: Home   Interval History and update:  No acute events overnight.  Patient compliant with all scheduled medications. No agitation prns administered.Pt received trazodone  and hydroxyzine  at bedtime.     Patient was lying calmly in her bedroom upon assessment, patient was able to have some moments of linearity during my assessment. She is alert to person, place and date. She reported depression was a 5 out of 10, 10 being most sever. Denied anxiety, suicidal ideations, homicidal ideation or auditory visual hallucinations.  She still continues to exhibit some delusions and flight of ideas, discussing various topics about her disability, concern that her family members (daughter or dad) may be dead.   Rea Acton: IVC Petitioner, ACT Team Member, ([phone])09/17/2024  Pt had been refusing medications the last 2 months. She was experiencing flight of ideas, exhibited disorganized behavior and was very tangential. In the past, she has accepted the injection and symptoms and here symptoms better improved. She would start to make more sense and experience some fixed delusions.   Silvano Salisbury ACT Team Nurse, [phone], 09/17/2024  Attempted to call and left a voicemail 2:37 PM   Physical Exam MSK/Neuro -patient is alert to person, self, place no focal deficits noted on observation Mental Status Exam Appearance - Casually dressed,  disheveled Attitude - Calm, polite, not  guarded Speech -normal prosody, inflection Mood -okay Affect -congruent Thought Process -disorganized  Thought Content - delusions,  illogical, TC expressed  SI/HI - Denies  Perceptions - Denies AVH; not RIS Judgement/Insight - Impaired  Fund of knowledge - Lacking  Language - No impairments  Lab Results:  Admission on 09/12/2024  Component Date Value Ref Range Status   Total CK 09/14/2024 107  38 - 234 U/L Final   CK, MB 09/14/2024 1.8  0.5 - 5.0 ng/mL Final   Pro Brain Natriuretic Peptide 09/14/2024 <50.0  <300.0 pg/mL Final   Hgb A1c MFr Bld 09/14/2024 5.6  4.8 - 5.6 % Final   Mean Plasma Glucose 09/14/2024 114.02  mg/dL Final   TSH 98/81/7973 0.713  0.350 - 4.500 uIU/mL Final   Cholesterol 09/14/2024 170  0 - 200 mg/dL Final   Triglycerides 98/81/7973 67  <150 mg/dL Final   HDL 98/81/7973 47  >40 mg/dL Final   Total CHOL/HDL Ratio 09/14/2024 3.6  RATIO Final   VLDL 09/14/2024 13  0 - 40 mg/dL Final   LDL Cholesterol 09/14/2024 109 (H)  0 - 99 mg/dL Final   RPR Ser Ql 98/81/7973 NON REACTIVE  NON REACTIVE Final   WBC 09/14/2024 4.8  4.0 - 10.5 K/uL Final   RBC 09/14/2024 4.16  3.87 - 5.11 MIL/uL Final   Hemoglobin 09/14/2024 12.5  12.0 - 15.0 g/dL Final   HCT 98/81/7973 37.6  36.0 - 46.0 % Final   MCV 09/14/2024 90.4  80.0 - 100.0 fL Final   MCH 09/14/2024 30.0  26.0 - 34.0 pg Final   MCHC 09/14/2024 33.2  30.0 - 36.0 g/dL Final   RDW 98/81/7973 12.9  11.5 - 15.5 % Final   Platelets 09/14/2024 255  150 - 400 K/uL Final   nRBC 09/14/2024 0.0  0.0 - 0.2 % Final   Neutrophils Relative % 09/14/2024 38  % Final   Neutro Abs 09/14/2024 1.8  1.7 - 7.7 K/uL Final   Lymphocytes Relative 09/14/2024 47  % Final   Lymphs Abs 09/14/2024 2.3  0.7 - 4.0 K/uL Final   Monocytes Relative 09/14/2024 7  % Final   Monocytes Absolute 09/14/2024 0.3  0.1 - 1.0 K/uL Final   Eosinophils Relative 09/14/2024 7  % Final   Eosinophils Absolute 09/14/2024  0.3  0.0 - 0.5 K/uL Final   Basophils Relative 09/14/2024 1  % Final   Basophils Absolute 09/14/2024 0.0  0.0 - 0.1 K/uL Final   Immature Granulocytes 09/14/2024 0  % Final   Abs Immature Granulocytes 09/14/2024 0.01  0.00 - 0.07 K/uL Final   Troponin T High Sensitivity 09/14/2024 <15  0 - 19 ng/L Final    Vitals: Blood pressure (!) 123/94, pulse 97, temperature 97.7 F (36.5 C), temperature source Oral, resp. rate 16, height 5' 7 (1.702 m), weight 103 kg, SpO2 100%.    Oluwakemi Salsberry, MD [facility] psychiatry resident PGY 2

## 2024-09-17 NOTE — Group Note (Signed)
 Date:  09/17/2024 Time:  4:29 PM  Group Topic/Focus:  Nurse group     Participation Level:  Did Not Attend   Meg FORBES Ellen 09/17/2024, 4:29 PM

## 2024-09-17 NOTE — Plan of Care (Signed)
  Problem: Education: Goal: Emotional status will improve Outcome: Progressing   Problem: Activity: Goal: Sleeping patterns will improve Outcome: Progressing   Problem: Safety: Goal: Periods of time without injury will increase Outcome: Progressing   Problem: Education: Goal: Mental status will improve Outcome: Not Progressing   Problem: Activity: Goal: Interest or engagement in activities will improve Outcome: Not Progressing

## 2024-09-17 NOTE — Group Note (Signed)
 Date:  09/17/2024 Time:  4:42 PM  Group Topic/Focus:  Emotional Wellness    Participation Level:  Did Not Attend   Meg FORBES Ellen 09/17/2024, 4:42 PM

## 2024-09-17 NOTE — Group Note (Signed)
 Date:  09/17/2024 Time:  12:01 PM  Group Topic/Focus:  Making Healthy Choices:   The focus of this group is to help patients identify negative/unhealthy choices they were using prior to admission and identify positive/healthier coping strategies to replace them upon discharge. This group was about physical wellness and moving our bodies around for cardio.     Participation Level:  Did Not Attend    Meg FORBES Ellen 09/17/2024, 12:01 PM

## 2024-09-17 NOTE — Group Note (Unsigned)
 Date:  09/17/2024 Time:  2:23 PM  Group Topic/Focus:  Nursing Group   Participation Level:  {BHH PARTICIPATION OZCZO:77735}  Participation Quality:  {BHH PARTICIPATION QUALITY:22265}  Affect:  {BHH AFFECT:22266}  Cognitive:  {BHH COGNITIVE:22267}  Insight: {BHH Insight2:20797}  Engagement in Group:  {BHH ENGAGEMENT IN HMNLE:77731}  Modes of Intervention:  {BHH MODES OF INTERVENTION:22269}  Additional Comments:  ***  Tim Corriher L Riyanna Crutchley 09/17/2024, 2:23 PM

## 2024-09-17 NOTE — Group Note (Signed)
 Date:  09/17/2024 Time:  10:49 AM  Group Topic/Focus:  Early Warning Signs:   The focus of this group is to help patients identify signs or symptoms they exhibit before slipping into an unhealthy state or crisis. Emotional Education:   The focus of this group is to discuss what feelings/emotions are, and how they are experienced. Healthy Communication:   The focus of this group is to discuss communication, barriers to communication, as well as healthy ways to communicate with others.    Participation Level:  Did Not Attend   Meg FORBES Ellen 09/17/2024, 10:49 AM

## 2024-09-17 NOTE — Group Note (Signed)
 Date:  09/17/2024 Time:  5:22 PM  Group Topic/Focus:  Making Healthy Choices:   The focus of this group is to help patients identify negative/unhealthy choices they were using prior to admission and identify positive/healthier coping strategies to replace them upon discharge. Things that we can do to improve overall brain health.      Participation Level:  Did Not Attend   Juliene CHRISTELLA Huddle 09/17/2024, 5:22 PM

## 2024-09-18 DIAGNOSIS — F250 Schizoaffective disorder, bipolar type: Secondary | ICD-10-CM | POA: Diagnosis not present

## 2024-09-18 MED ORDER — CLOZAPINE 100 MG PO TABS
100.0000 mg | ORAL_TABLET | Freq: Every day | ORAL | Status: DC
  Administered 2024-09-18: 100 mg via ORAL
  Filled 2024-09-18: qty 1

## 2024-09-18 MED ORDER — POLYETHYLENE GLYCOL 3350 17 G PO PACK
17.0000 g | PACK | Freq: Every day | ORAL | Status: DC
  Administered 2024-09-22 – 2024-09-30 (×4): 17 g via ORAL
  Filled 2024-09-18 (×6): qty 1

## 2024-09-18 NOTE — Plan of Care (Signed)
" °  Problem: Education: Goal: Knowledge of [facility] Education information/materials will improve Outcome: Progressing Goal: Emotional status will improve Outcome: Progressing Goal: Mental status will improve Outcome: Progressing Goal: Verbalization of understanding the information provided will improve Outcome: Progressing   Problem: Activity: Goal: Interest or engagement in activities will improve Outcome: Progressing Goal: Sleeping patterns will improve Outcome: Progressing   Problem: Coping: Goal: Ability to verbalize frustrations and anger appropriately will improve Outcome: Progressing Goal: Ability to demonstrate self-control will improve Outcome: Progressing   Problem: Health Behavior/Discharge Planning: Goal: Identification of resources available to assist in meeting health care needs will improve Outcome: Progressing Goal: Compliance with treatment plan for underlying cause of condition will improve Outcome: Progressing   Problem: Physical Regulation: Goal: Ability to maintain clinical measurements within normal limits will improve Outcome: Progressing   Problem: Safety: Goal: Periods of time without injury will increase Outcome: Progressing   Problem: Education: Goal: Knowledge of [facility] Education information/materials will improve Outcome: Progressing Goal: Emotional status will improve Outcome: Progressing Goal: Mental status will improve Outcome: Progressing Goal: Verbalization of understanding the information provided will improve Outcome: Progressing   Problem: Activity: Goal: Interest or engagement in activities will improve Outcome: Progressing Goal: Sleeping patterns will improve Outcome: Progressing   Problem: Coping: Goal: Ability to verbalize frustrations and anger appropriately will improve Outcome: Progressing Goal: Ability to demonstrate self-control will improve Outcome: Progressing   Problem: Health  Behavior/Discharge Planning: Goal: Identification of resources available to assist in meeting health care needs will improve Outcome: Progressing Goal: Compliance with treatment plan for underlying cause of condition will improve Outcome: Progressing   Problem: Physical Regulation: Goal: Ability to maintain clinical measurements within normal limits will improve Outcome: Progressing   Problem: Safety: Goal: Periods of time without injury will increase Outcome: Progressing   Problem: Activity: Goal: Will verbalize the importance of balancing activity with adequate rest periods Outcome: Progressing   Problem: Education: Goal: Will be free of psychotic symptoms Outcome: Progressing Goal: Knowledge of the prescribed therapeutic regimen will improve Outcome: Progressing   Problem: Coping: Goal: Coping ability will improve Outcome: Progressing Goal: Will verbalize feelings Outcome: Progressing   Problem: Health Behavior/Discharge Planning: Goal: Compliance with prescribed medication regimen will improve Outcome: Progressing   Problem: Nutritional: Goal: Ability to achieve adequate nutritional intake will improve Outcome: Progressing   Problem: Role Relationship: Goal: Ability to communicate needs accurately will improve Outcome: Progressing Goal: Ability to interact with others will improve Outcome: Progressing   Problem: Safety: Goal: Ability to redirect hostility and anger into socially appropriate behaviors will improve Outcome: Progressing Goal: Ability to remain free from injury will improve Outcome: Progressing   Problem: Self-Care: Goal: Ability to participate in self-care as condition permits will improve Outcome: Progressing   Problem: Self-Concept: Goal: Will verbalize positive feelings about self Outcome: Progressing   Problem: Coping: Goal: Ability to identify and develop effective coping behavior will improve Outcome: Progressing Goal: Ability to  interact with others will improve Outcome: Progressing Goal: Demonstration of participation in decision-making regarding own care will improve Outcome: Progressing Goal: Ability to use eye contact when communicating with others will improve Outcome: Progressing   "

## 2024-09-18 NOTE — Plan of Care (Signed)
  Problem: Education: Goal: Emotional status will improve Outcome: Progressing   Problem: Activity: Goal: Sleeping patterns will improve Outcome: Progressing   Problem: Safety: Goal: Periods of time without injury will increase Outcome: Progressing   Problem: Education: Goal: Mental status will improve Outcome: Not Progressing

## 2024-09-18 NOTE — Group Note (Signed)
 Date:  09/18/2024 Time:  9:43 AM  Group Topic/Focus:  Goals Group:   The focus of this group is to help patients establish daily goals to achieve during treatment and discuss how the patient can incorporate goal setting into their daily lives to aide in recovery.    Participation Level:  Did Not Attend  Additional Comments:  Pt chose not to attend group.  Adriana GORMAN Blush 09/18/2024, 9:43 AM

## 2024-09-18 NOTE — Care Management Important Message (Signed)
 Patient informed of right to appeal discharge, provided phone number to [employer]. Patient expressed no interest in appealing discharge at this time. CSW will continue to monitor situation.   Debera Sterba, LCSW 09/18/2024

## 2024-09-18 NOTE — Group Note (Deleted)
 Date:  09/18/2024 Time:  12:32 AM  Group Topic/Focus:  Wrap-Up Group:   The focus of this group is to help patients review their daily goal of treatment and discuss progress on daily workbooks.     Participation Level:  {BHH PARTICIPATION OZCZO:77735}  Participation Quality:  {BHH PARTICIPATION QUALITY:22265}  Affect:  {BHH AFFECT:22266}  Cognitive:  {BHH COGNITIVE:22267}  Insight: {BHH Insight2:20797}  Engagement in Group:  {BHH ENGAGEMENT IN HMNLE:77731}  Modes of Intervention:  {BHH MODES OF INTERVENTION:22269}  Additional Comments:  ***  Eward Mace 09/18/2024, 12:32 AM

## 2024-09-18 NOTE — Group Note (Signed)
 Date:  09/18/2024 Time:  12:56 AM  Group Topic/Focus:  Wrap-Up Group:   The focus of this group is to help patients review their daily goal of treatment and discuss progress on daily workbooks.    Participation Level:  Minimal  Participation Quality:  Appropriate and Sharing  Affect:  Anxious and Flat  Cognitive:  Disorganized  Insight: Limited  Engagement in Group:  Limited and Off Topic  Modes of Intervention:  Activity and Socialization  Additional Comments:  Patients shared one word to described their day. Patient shared that her day was distraught. Patient rated her day a 5 out of 10. Patient mentioned concerns regarding IVC. Patient did become off topic and began to ramble about several different things  Eward Mace 09/18/2024, 12:56 AM

## 2024-09-18 NOTE — Progress Notes (Signed)
 [facility] Inpatient Psychiatry Progress Note  Date: 09/18/24 Patient: [name] MRN: [id number]  Assessment and Plan: [name] is a 54 y.o. female with a past psychiatric schizoaffective bipolar type, transported to the [facility] emergency department for disorganized thoughts, paranoia and refusal to take medications. She was petitioned for IVC by her ACT Team. She was transferred to the [facility] for symptomatic stabilization and medication management.  Assessment this morning, patient with some improvements in linearity with some flight of ideas and disorganization. Collateral with IVC petitioner about patient's behavior stated at baseline the patient might have some fixed delusions, however would be compliant with medications and have less disorganized thought process. Patient currently on clozapine , unable to speak with ACT nurse about home regimen after multiple attempts, will continue to attempt to obtain information, and can possibly consider dual antipsychotic therapy w/ hopeful transition  long-acting injectable given patient's stability previously versus continuing with clozapine  monotherapy for schizophrenia. Patient will be approaching a week on monotherapy on Sunday.    # Schizoaffective disorder, bipolar type (HCC) -- Continue Depakote  500 mg every 12 hours for bipolar component of schizoaffective disorder, given improvement in acute manic like behavior -- Increase Clozapine  100  mg at bedtime for schizoaffective disorder, given sedation effects  -- Metabolic profile and EKG monitoring obtained while on an atypical antipsychotic  (BMI: 35.55  Lipid Panel: LDL 109  HbgA1c: 0.6  QTc: 459 -- Clozapine  labs ordered include: CBC WNL , troponins Neg, proBNP negative - CK 107 and CK-MB 1.8  - CRP patient refused labs  - TSH WNL - RPR Neg  Risk Assessment - Mild: Patient with a prior history of suicidal ideations and suicide  attempt, patient not currently endorsing any suicidal thoughts, however does not have great executive function given current psychiatric decompensation.  Patient also noncompliant on medications which puts her at an increased risk of self-harm   Discharge Planning Barriers to discharge: Medication compliance, Safety planning, contact with ACT Team  Estimated length of stay: 7- 14 days  Predicted Discharge location: Home   Interval History and update:  No acute events overnight.  Patient compliant with all scheduled medications. No agitation prns administered.     Patient was lying calmly in her bedroom upon assessment, patient was able to have some moments of linearity during my assessment. She reports some moderate depression, with concerns that something bad may happen to family members. Denied anxiety, suicidal ideations, homicidal ideation or auditory visual hallucinations.  She still continues to exhibit some delusions and flight of ideas, discussing various topics about her concern about her family members, making statements about disability,   Rea Acton: IVC Petitioner, ACT Team Member, [phone])09/17/2024  Pt had been refusing medications the last 2 months. She was experiencing flight of ideas, exhibited disorganized behavior and was very tangential. In the past, she has accepted the injection and symptoms and here symptoms better improved. She would start to make more sense and experience some fixed delusions.   Silvano Salisbury ACT Team Nurse, [phone],   09/18/2024   Attempts x2 at 1:37 PM   09/17/2024  Attempted to call and left a voicemail 2:37 PM   Physical Exam MSK/Neuro -patient is alert to person, self, place no focal deficits noted on observation Mental Status Exam Appearance - Casually dressed, disheveled Attitude - Calm, polite, not guarded Speech -normal prosody, inflection Mood -okay Affect -congruent Thought Process -disorganized  Thought Content -  delusions,  illogical, TC expressed  SI/HI - Denies  Perceptions - Denies AVH; not RIS Judgement/Insight - Impaired  Fund of knowledge - Lacking  Language - No impairments  Lab Results:  Admission on 09/12/2024  Component Date Value Ref Range Status   Total CK 09/14/2024 107  38 - 234 U/L Final   CK, MB 09/14/2024 1.8  0.5 - 5.0 ng/mL Final   Pro Brain Natriuretic Peptide 09/14/2024 <50.0  <300.0 pg/mL Final   Hgb A1c MFr Bld 09/14/2024 5.6  4.8 - 5.6 % Final   Mean Plasma Glucose 09/14/2024 114.02  mg/dL Final   TSH 98/81/7973 0.713  0.350 - 4.500 uIU/mL Final   Cholesterol 09/14/2024 170  0 - 200 mg/dL Final   Triglycerides 98/81/7973 67  <150 mg/dL Final   HDL 98/81/7973 47  >40 mg/dL Final   Total CHOL/HDL Ratio 09/14/2024 3.6  RATIO Final   VLDL 09/14/2024 13  0 - 40 mg/dL Final   LDL Cholesterol 09/14/2024 109 (H)  0 - 99 mg/dL Final   RPR Ser Ql 98/81/7973 NON REACTIVE  NON REACTIVE Final   WBC 09/14/2024 4.8  4.0 - 10.5 K/uL Final   RBC 09/14/2024 4.16  3.87 - 5.11 MIL/uL Final   Hemoglobin 09/14/2024 12.5  12.0 - 15.0 g/dL Final   HCT 98/81/7973 37.6  36.0 - 46.0 % Final   MCV 09/14/2024 90.4  80.0 - 100.0 fL Final   MCH 09/14/2024 30.0  26.0 - 34.0 pg Final   MCHC 09/14/2024 33.2  30.0 - 36.0 g/dL Final   RDW 98/81/7973 12.9  11.5 - 15.5 % Final   Platelets 09/14/2024 255  150 - 400 K/uL Final   nRBC 09/14/2024 0.0  0.0 - 0.2 % Final   Neutrophils Relative % 09/14/2024 38  % Final   Neutro Abs 09/14/2024 1.8  1.7 - 7.7 K/uL Final   Lymphocytes Relative 09/14/2024 47  % Final   Lymphs Abs 09/14/2024 2.3  0.7 - 4.0 K/uL Final   Monocytes Relative 09/14/2024 7  % Final   Monocytes Absolute 09/14/2024 0.3  0.1 - 1.0 K/uL Final   Eosinophils Relative 09/14/2024 7  % Final   Eosinophils Absolute 09/14/2024 0.3  0.0 - 0.5 K/uL Final   Basophils Relative 09/14/2024 1  % Final   Basophils Absolute 09/14/2024 0.0  0.0 - 0.1 K/uL Final   Immature Granulocytes 09/14/2024 0  %  Final   Abs Immature Granulocytes 09/14/2024 0.01  0.00 - 0.07 K/uL Final   Troponin T High Sensitivity 09/14/2024 <15  0 - 19 ng/L Final    Vitals: Blood pressure (!) 132/97, pulse 95, temperature (!) 97.2 F (36.2 C), temperature source Oral, resp. rate 18, height 5' 7 (1.702 m), weight 103 kg, SpO2 100%.    Kori Colin, MD [facility] psychiatry resident PGY 2

## 2024-09-18 NOTE — Group Note (Signed)
 Date:  09/18/2024 Time:  1:15 PM  Group Topic/Focus:  Coping With Mental Health Crisis:   The purpose of this group is to help patients identify strategies for coping with mental health crisis.  Group discusses possible causes of crisis and ways to manage them effectively.    Participation Level:  Did Not Attend   Adriana GORMAN Blush 09/18/2024, 1:15 PM

## 2024-09-18 NOTE — Progress Notes (Signed)
 Collateral contact - [facility] Assertive Community Treatment Team ([name]) - [place], [address], [place], [place] [address], [phone]   CSW left a voicemail for India Mahatha Pharmacist, Hospital) [phone].    Kevan Prouty, LCSW 09/18/2024

## 2024-09-18 NOTE — Care Management Important Message (Signed)
 Medicare IM printed and given to social work to give to the patient. ?

## 2024-09-18 NOTE — Group Note (Signed)
 Date:  09/18/2024 Time:  10:56 AM  Group Topic/Focus:  Nutrition:     Participation Level:  Did Not Attend   Adriana GORMAN Blush 09/18/2024, 10:56 AM

## 2024-09-18 NOTE — Group Note (Signed)
 Date:  09/18/2024 Time:  10:02 AM  Group Topic/Focus:  Recreation Therapy/ Music Therapy    Participation Level:  Did Not Attend    [name] 09/18/2024, 10:02 AM

## 2024-09-18 NOTE — Group Note (Signed)
 Recreation Therapy Group Note   Group Topic:Leisure Education  Group Date: 09/18/2024 Start Time: 0955 End Time: 1045 Facilitators: Jasman Murri-McCall, LRT,CTRS Location: [address] Dayroom   Group Topic: Leisure Education  Goal Area(s) Addresses:  Patient will identify positive leisure and recreation activities.  Patient will identify one positive benefit of participation in leisure activities.   Behavioral Response:   Intervention: Music Therapy  Activity: LRT and patients talked about the many uses of music. LRT and patients also discussed how music can be used as a leisure outlet. LRT then allowed patients to pick various songs they wanted to listen to and/or introduce peers to. Each song picked had to be clean and appropriate.  Education:  Teacher, English As A Foreign Language, Special Educational Needs Teacher, Discharge Planning  Education Outcome: Acknowledges education/In group clarification offered/Needs additional education   Affect/Mood: N/A   Participation Level: Did not attend    Clinical Observations/Individualized Feedback:      Plan: Continue to engage patient in RT group sessions 2-3x/week.   Kealey Kemmer-McCall, LRT,CTRS 09/18/2024 11:39 AM

## 2024-09-18 NOTE — Progress Notes (Signed)
(  Sleep Hours) -9  (Any PRNs that were needed, meds refused, or side effects to meds)- none  (Any disturbances and when (visitation, over night)-none  (Concerns raised by the patient)- none , pt visible in the dayroom some this evening  (SI/HI/AVH)-denies

## 2024-09-18 NOTE — Progress Notes (Signed)
" °   09/18/24 1000  Psychosocial Assessment  Patient Complaints Confusion;Suspiciousness  Eye Contact Fair  Facial Expression Anxious  Affect Preoccupied  Speech Tangential  Interaction Assertive  Motor Activity Slow;Fidgety  Appearance/Hygiene Disheveled  Behavior Characteristics Unwilling to participate  Mood Suspicious;Preoccupied  Thought Process  Coherency Tangential;Circumstantial  Content Preoccupation;Delusions  Delusions Paranoid  Perception Derealization  Hallucination None reported or observed  Judgment Impaired  Confusion Mild  Danger to Self  Current suicidal ideation? Denies  Danger to Others  Danger to Others None reported or observed    "

## 2024-09-19 ENCOUNTER — Encounter (HOSPITAL_COMMUNITY): Payer: Self-pay

## 2024-09-19 DIAGNOSIS — F250 Schizoaffective disorder, bipolar type: Secondary | ICD-10-CM | POA: Diagnosis not present

## 2024-09-19 MED ORDER — CLOZAPINE 25 MG PO TABS
125.0000 mg | ORAL_TABLET | Freq: Every day | ORAL | Status: DC
  Administered 2024-09-19 – 2024-09-20 (×2): 125 mg via ORAL
  Filled 2024-09-19 (×2): qty 1

## 2024-09-19 MED ORDER — DIVALPROEX SODIUM ER 250 MG PO TB24
250.0000 mg | ORAL_TABLET | Freq: Two times a day (BID) | ORAL | Status: DC
  Administered 2024-09-19 – 2024-09-20 (×2): 250 mg via ORAL
  Filled 2024-09-19 (×2): qty 1

## 2024-09-19 NOTE — Group Note (Signed)
 Recreation Therapy Group Note   Group Topic:General Recreation  Group Date: 09/19/2024 Start Time: 1030 End Time: 1100 Facilitators: Jezreel Justiniano-McCall, LRT,CTRS Location: [address] Dayroom   Group Topic/Focus: General Recreation   Goal Area(s) Addresses:  Patient will use appropriate interactions in play with peers.   Patient will appropriately follow the rules of the activity.  Behavioral Response:    Intervention: Competitive Play  Activity: Grab the Mic. LRT flips over a card with a word on it and the patient had to think of a lyric with that word in it. Each card has a number in the corner that represents the points for that word. The person who could think of lyric, races to the mic, picks it up and sings the lyrics. The person with the most points wins the game.     Affect/Mood: N/A   Participation Level: Did not attend    Clinical Observations/Individualized Feedback:      Plan: Continue to engage patient in RT group sessions 2-3x/week.   Semaj Kham-McCall, LRT,CTRS 09/19/2024 1:36 PM

## 2024-09-19 NOTE — BH IP Treatment Plan (Signed)
 Interdisciplinary Treatment and Diagnostic Plan Update  09/19/2024 Time of Session: 11:25 AM - UPDATE [name] MRN: [id number]  Principal Diagnosis: Schizoaffective disorder, bipolar type (HCC)  Secondary Diagnoses: Principal Problem:   Schizoaffective disorder, bipolar type (HCC)   Current Medications:  Current Facility-Administered Medications  Medication Dose Route Frequency Provider Last Rate Last Admin   albuterol  (VENTOLIN  HFA) 108 (90 Base) MCG/ACT inhaler 1-2 puff  1-2 puff Inhalation Q6H PRN Lenard Calin, MD   2 puff at 09/18/24 2037   cloZAPine  (CLOZARIL ) tablet 100 mg  100 mg Oral QHS Lenard Calin, MD   100 mg at 09/18/24 2036   divalproex  (DEPAKOTE  ER) 24 hr tablet 500 mg  500 mg Oral Q12H Lenard Calin, MD   500 mg at 09/19/24 9158   ibuprofen  (ADVIL ) tablet 800 mg  800 mg Oral Q8H PRN Lenard Calin, MD       LORazepam  (ATIVAN ) tablet 1 mg  1 mg Oral Daily PRN Onuoha, Chinwendu V, NP       Or   LORazepam  (ATIVAN ) injection 1 mg  1 mg Intramuscular Daily PRN Onuoha, Chinwendu V, NP       OLANZapine  zydis (ZYPREXA ) disintegrating tablet 5 mg  5 mg Oral TID PRN Onuoha, Chinwendu V, NP   5 mg at 09/12/24 2050   polyethylene glycol (MIRALAX  / GLYCOLAX ) packet 17 g  17 g Oral Daily Lenard Calin, MD   17 g at 09/19/24 0841   senna (SENOKOT) tablet 8.6 mg  1 tablet Oral QHS PRN Lenard Calin, MD       PTA Medications: Medications Prior to Admission  Medication Sig Dispense Refill Last Dose/Taking   albuterol  (VENTOLIN  HFA) 108 (90 Base) MCG/ACT inhaler Inhale 1-2 puffs into the lungs every 6 (six) hours as needed for wheezing or shortness of breath.      ascorbic acid  (VITAMIN C) 500 MG tablet Take 1 tablet (500 mg total) by mouth 2 (two) times daily. (Patient not taking: Reported on 09/11/2024) 60 tablet 0    budesonide -formoterol  (SYMBICORT ) 160-4.5 MCG/ACT inhaler Inhale 2 puffs into the lungs daily. (Patient not taking: Reported on 09/11/2024) 1 each 12     CAPLYTA 42 MG capsule Take 42 mg by mouth daily.      divalproex  (DEPAKOTE  ER) 500 MG 24 hr tablet Take 2 tablets (1,000 mg total) by mouth at bedtime. (Patient not taking: Reported on 09/11/2024) 60 tablet 0    fluticasone  (FLONASE ) 50 MCG/ACT nasal spray Place 1 spray into both nostrils daily. (Patient not taking: Reported on 09/11/2024) 18.2 mL 2    fluticasone  (FLONASE ) 50 MCG/ACT nasal spray Place 1 spray into both nostrils daily. (Patient not taking: Reported on 09/11/2024) 11.1 mL 0    ibuprofen  (ADVIL ) 800 MG tablet Take 1 tablet (800 mg total) by mouth every 8 (eight) hours as needed for moderate pain. (Patient not taking: Reported on 09/11/2024) 21 tablet 0    metFORMIN  (GLUCOPHAGE ) 500 MG tablet Take 1 tablet (500 mg total) by mouth daily with breakfast. (Patient not taking: Reported on 09/11/2024) 30 tablet 0    ondansetron  (ZOFRAN -ODT) 4 MG disintegrating tablet Take 1 tablet (4 mg total) by mouth every 8 (eight) hours as needed for nausea or vomiting. (Patient not taking: Reported on 09/11/2024) 20 tablet 0    paliperidone  (INVEGA  SUSTENNA) 234 MG/1.5ML injection Inject 234 mg into the muscle every 28 (twenty-eight) days. (Patient not taking: Reported on 09/11/2024) 1.5 mL 0    traZODone  (DESYREL ) 50 MG tablet Take 1  tablet (50 mg total) by mouth at bedtime as needed for sleep. (Patient not taking: Reported on 09/11/2024) 30 tablet 0     Patient Stressors: Health problems   Medication change or noncompliance    Patient Strengths: Ability for insight  Communication skills   Treatment Modalities: Medication Management, Group therapy, Case management,  1 to 1 session with clinician, Psychoeducation, Recreational therapy.   Physician Treatment Plan for Primary Diagnosis: Schizoaffective disorder, bipolar type (HCC) Long Term Goal(s): Improvement in symptoms so as ready for discharge   Short Term Goals: Ability to identify changes in lifestyle to reduce recurrence of condition will  improve Ability to identify and develop effective coping behaviors will improve Compliance with prescribed medications will improve Ability to identify triggers associated with substance abuse/mental health issues will improve  Medication Management: Evaluate patient's response, side effects, and tolerance of medication regimen.  Therapeutic Interventions: 1 to 1 sessions, Unit Group sessions and Medication administration.  Evaluation of Outcomes: Progressing  Physician Treatment Plan for Secondary Diagnosis: Principal Problem:   Schizoaffective disorder, bipolar type (HCC)  Long Term Goal(s): Improvement in symptoms so as ready for discharge   Short Term Goals: Ability to identify changes in lifestyle to reduce recurrence of condition will improve Ability to identify and develop effective coping behaviors will improve Compliance with prescribed medications will improve Ability to identify triggers associated with substance abuse/mental health issues will improve     Medication Management: Evaluate patient's response, side effects, and tolerance of medication regimen.  Therapeutic Interventions: 1 to 1 sessions, Unit Group sessions and Medication administration.  Evaluation of Outcomes: Progressing   RN Treatment Plan for Primary Diagnosis: Schizoaffective disorder, bipolar type (HCC) Long Term Goal(s): Knowledge of disease and therapeutic regimen to maintain health will improve  Short Term Goals: Ability to remain free from injury will improve, Ability to verbalize frustration and anger appropriately will improve, Ability to verbalize feelings will improve, and Ability to disclose and discuss suicidal ideas  Medication Management: RN will administer medications as ordered by provider, will assess and evaluate patient's response and provide education to patient for prescribed medication. RN will report any adverse and/or side effects to prescribing provider.  Therapeutic  Interventions: 1 on 1 counseling sessions, Psychoeducation, Medication administration, Evaluate responses to treatment, Monitor vital signs and CBGs as ordered, Perform/monitor CIWA, COWS, AIMS and Fall Risk screenings as ordered, Perform wound care treatments as ordered.  Evaluation of Outcomes: Progressing   LCSW Treatment Plan for Primary Diagnosis: Schizoaffective disorder, bipolar type (HCC) Long Term Goal(s): Safe transition to appropriate next level of care at discharge, Engage patient in therapeutic group addressing interpersonal concerns.  Short Term Goals: Engage patient in aftercare planning with referrals and resources, Increase ability to appropriately verbalize feelings, Facilitate acceptance of mental health diagnosis and concerns, and Identify triggers associated with mental health/substance abuse issues  Therapeutic Interventions: Assess for all discharge needs, 1 to 1 time with Social worker, Explore available resources and support systems, Assess for adequacy in community support network, Educate family and significant other(s) on suicide prevention, Complete Psychosocial Assessment, Interpersonal group therapy.  Evaluation of Outcomes: Progressing   Progress in Treatment: Attending groups: No. Participating in groups: No. Taking medication as prescribed: Yes. Toleration medication: Yes. Family/Significant other contact made:  Yes, contacted:  [name] (daughter) ph: [phone]. Patient understands diagnosis: Yes. Discussing patient identified problems/goals with staff: Yes. Medical problems stabilized or resolved: Yes. Denies suicidal/homicidal ideation: Yes. Issues/concerns per patient self-inventory: No. None reported.   New problem(s)  identified: No, Describe:  None identified.   New Short Term/Long Term Goal(s): medication stabilization, elimination of SI thoughts, development of comprehensive mental wellness plan.     Patient Goals:  UTA, patient  presented as disorganized with tangential speech.    Discharge Plan or Barriers: Patient recently admitted. CSW will continue to follow and assess for appropriate referrals and possible discharge planning.     Reason for Continuation of Hospitalization: Delusions  Mania Medication stabilization Other; describe : disorganized    Estimated Length of Stay:  4 - 6 days.  Last 3 [place] Suicide Severity Risk Score: Flowsheet Row [facility] (Current[facility]11[facility]2024 [facility] Pre-admit (Canceled) [facility]  C-SSRS RISK CATEGORY No Risk No Risk No Risk    Last PHQ 2/9 Scores:    02/23/2021    8:30 AM 09/16/2020    1:00 PM 10/24/2016    3:24 PM  Depression screen PHQ 2/9  Decreased Interest 1 0 1  Down, Depressed, Hopeless 1 0 1  PHQ - 2 Score 2 0 2  Altered sleeping 2 1 3   Tired, decreased energy 0 0 1  Change in appetite 0 0 0  Feeling bad or failure about yourself  1 0 1  Trouble concentrating 0 0 0  Moving slowly or fidgety/restless 0 0 3  Suicidal thoughts 1 0 1   PHQ-9 Score 6  1  11       Data saved with a previous flowsheet row definition    Scribe for Treatment Team: Keyri Salberg O Blondina Coderre, LCSW 09/19/2024 2:29 PM

## 2024-09-19 NOTE — Group Note (Signed)
 Date:  09/19/2024 Time:  4:02 PM  Group Topic/Focus:  Wellness Toolbox:   The focus of this group is to discuss various aspects of wellness, balancing those aspects and exploring ways to increase the ability to experience wellness.  Patients will create a wellness toolbox for use upon discharge.    Participation Level:  Active  Participation Quality:  Appropriate and Attentive  Affect:  Appropriate  Cognitive:  Alert and Appropriate  Insight: Appropriate and Good  Engagement in Group:  Engaged and Supportive  Modes of Intervention:  Activity and Socialization    [name] Lowers 09/19/2024, 4:02 PM

## 2024-09-19 NOTE — Group Note (Signed)
 Date:  09/19/2024 Time:  9:10 AM  Group Topic/Focus:  Goals Group:   The focus of this group is to help patients establish daily goals to achieve during treatment and discuss how the patient can incorporate goal setting into their daily lives to aide in recovery.    Participation Level:  Did Not Attend

## 2024-09-19 NOTE — Group Note (Signed)
 Date:  09/19/2024 Time:  11:50 AM  Music therapy

## 2024-09-19 NOTE — Group Note (Signed)
 Date:  09/19/2024 Time:  9:16 PM  Group Topic/Focus:  Wrap-Up Group:   The focus of this group is to help patients review their daily goal of treatment and discuss progress on daily workbooks.    Participation Level:  Active  Participation Quality:  Appropriate  Affect:  Appropriate  Cognitive:  Alert  Insight: Appropriate  Engagement in Group:  Engaged  Modes of Intervention:  Discussion  Additional Comments:     [name] 09/19/2024, 9:16 PM

## 2024-09-19 NOTE — Plan of Care (Signed)
  Problem: Activity: Goal: Sleeping patterns will improve Outcome: Progressing   Problem: Coping: Goal: Ability to demonstrate self-control will improve Outcome: Progressing   Problem: Health Behavior/Discharge Planning: Goal: Compliance with treatment plan for underlying cause of condition will improve Outcome: Progressing

## 2024-09-19 NOTE — Progress Notes (Signed)
 [facility] Inpatient Psychiatry Progress Note  Date: 09/19/24 Patient: [name] MRN: [id number]  Assessment and Plan: [name] is a 54 y.o. female with a past psychiatric schizoaffective bipolar type, transported to the [facility] emergency department for disorganized thoughts, paranoia and refusal to take medications. She was petitioned for IVC by her ACT Team. She was transferred to the [facility] for symptomatic stabilization and medication management.  Assessment this morning, patient with some improvements in linearity and less overtly delusional content or disorganization. Collateral with IVC petitioner about patient's behavior stated at baseline the patient might have some fixed delusions, however would be compliant with medications and have less disorganized thought process. Patient currently on clozapine , will continue with monotherapy for now given some improvement and additional needed time to assess optimization on clozapine . Patient will be approaching a week on monotherapy on Sunday, can consider possible dual antipsychotic therapy if indicated at that time.    # Schizoaffective disorder, bipolar type (HCC) -- Continue Depakote  500 mg every 12 hours for bipolar component of schizoaffective disorder, given improvement in acute manic like behavior -- Continue Clozapine  100  mg at bedtime for schizoaffective disorder, can address -- Metabolic profile and EKG monitoring obtained while on an atypical antipsychotic  (BMI: 35.55  Lipid Panel: LDL 109  HbgA1c: 0.6  QTc: 459 -- Clozapine  labs ordered include: CBC WNL , troponins Neg, proBNP negative - CK 107 and CK-MB 1.8  - CRP patient refused labs  - TSH WNL - RPR Neg  Risk Assessment - Mild: Patient with a prior history of suicidal ideations and suicide attempt, patient not currently endorsing any suicidal thoughts, however does not have great executive function given current  psychiatric decompensation.  Patient also noncompliant on medications which puts her at an increased risk of self-harm   Discharge Planning Barriers to discharge: Medication compliance, Safety planning, contact with ACT Team  Estimated length of stay: 7- 14 days  Predicted Discharge location: Home   Interval History and update:  No acute events overnight.  Patient compliant with all scheduled medications. No agitation prns administered.     Patient was more linear and organized with this provider this morning than previous assessments.  She was able to answer questions about attending some groups, although there is conflicting information per chart review. She denies any significant depression or anxiety. She denies SI,HI, AVH. She does not appear to be RIS. She reports that she also spoke with her daughter [name] yesterday.  Patient is oriented to person place self and date.  Patient able to state that she ate eggs and bacon this morning for breakfast.  And had a bowel movement earlier today.  Patient able to state that she went to [employer] and eventually to the [employer] when asked about her education background.  She also highlighted that she was a part of the [employer].  Patient did make some statements about someone coming to the house mistaking her.   [name] Daughter, [phone] attempted x1 09/19/2024 11:25 Attempted to call daughter for further collateral and see if she has noticed in progress in mom,   Rea Acton: IVC Petitioner, ACT Team Member, [phone])09/17/2024  Pt had been refusing medications the last 2 months. She was experiencing flight of ideas, exhibited disorganized behavior and was very tangential. In the past, she has accepted the injection and symptoms and here symptoms better improved. She would start to make more sense and experience some fixed  delusions.   Silvano Salisbury ACT Team Nurse, [phone],   09/18/2024   Attempts x2 at  1:37 PM   09/17/2024  Attempted to call and left a voicemail 2:37 PM   Physical Exam MSK/Neuro -patient is alert to person, self, place no focal deficits noted on observation Mental Status Exam Appearance - Casually dressed, disheveled Attitude - Calm, polite, not guarded Speech -normal prosody, inflection Mood -okay Affect -congruent Thought Process -disorganized  Thought Content - suspect some fixed delusions,  TC expressed  SI/HI - Denies  Perceptions - Denies AVH; not RIS Judgement/Insight - Impaired  Fund of knowledge - Lacking  Language - No impairments  Lab Results:  Admission on 09/12/2024  Component Date Value Ref Range Status   Total CK 09/14/2024 107  38 - 234 U/L Final   CK, MB 09/14/2024 1.8  0.5 - 5.0 ng/mL Final   Pro Brain Natriuretic Peptide 09/14/2024 <50.0  <300.0 pg/mL Final   Hgb A1c MFr Bld 09/14/2024 5.6  4.8 - 5.6 % Final   Mean Plasma Glucose 09/14/2024 114.02  mg/dL Final   TSH 98/81/7973 0.713  0.350 - 4.500 uIU/mL Final   Cholesterol 09/14/2024 170  0 - 200 mg/dL Final   Triglycerides 98/81/7973 67  <150 mg/dL Final   HDL 98/81/7973 47  >40 mg/dL Final   Total CHOL/HDL Ratio 09/14/2024 3.6  RATIO Final   VLDL 09/14/2024 13  0 - 40 mg/dL Final   LDL Cholesterol 09/14/2024 109 (H)  0 - 99 mg/dL Final   RPR Ser Ql 98/81/7973 NON REACTIVE  NON REACTIVE Final   WBC 09/14/2024 4.8  4.0 - 10.5 K/uL Final   RBC 09/14/2024 4.16  3.87 - 5.11 MIL/uL Final   Hemoglobin 09/14/2024 12.5  12.0 - 15.0 g/dL Final   HCT 98/81/7973 37.6  36.0 - 46.0 % Final   MCV 09/14/2024 90.4  80.0 - 100.0 fL Final   MCH 09/14/2024 30.0  26.0 - 34.0 pg Final   MCHC 09/14/2024 33.2  30.0 - 36.0 g/dL Final   RDW 98/81/7973 12.9  11.5 - 15.5 % Final   Platelets 09/14/2024 255  150 - 400 K/uL Final   nRBC 09/14/2024 0.0  0.0 - 0.2 % Final   Neutrophils Relative % 09/14/2024 38  % Final   Neutro Abs 09/14/2024 1.8  1.7 - 7.7 K/uL Final   Lymphocytes Relative 09/14/2024 47  %  Final   Lymphs Abs 09/14/2024 2.3  0.7 - 4.0 K/uL Final   Monocytes Relative 09/14/2024 7  % Final   Monocytes Absolute 09/14/2024 0.3  0.1 - 1.0 K/uL Final   Eosinophils Relative 09/14/2024 7  % Final   Eosinophils Absolute 09/14/2024 0.3  0.0 - 0.5 K/uL Final   Basophils Relative 09/14/2024 1  % Final   Basophils Absolute 09/14/2024 0.0  0.0 - 0.1 K/uL Final   Immature Granulocytes 09/14/2024 0  % Final   Abs Immature Granulocytes 09/14/2024 0.01  0.00 - 0.07 K/uL Final   Troponin T High Sensitivity 09/14/2024 <15  0 - 19 ng/L Final    Vitals: Blood pressure (!) 137/96, pulse (!) 102, temperature 97.6 F (36.4 C), temperature source Oral, resp. rate 18, height 5' 7 (1.702 m), weight 103 kg, SpO2 98%.    Krystle Oberman, MD [facility] psychiatry resident PGY 2

## 2024-09-19 NOTE — Group Note (Signed)
 Date:  09/19/2024 Time:  7:02 AM  Group Topic/Focus:  Wrap-Up Group:   The focus of this group is to help patients review their daily goal of treatment and discuss progress on daily workbooks.    Participation Level:  Did Not Attend   [name] 09/19/2024, 7:02 AM

## 2024-09-19 NOTE — BHH Group Notes (Signed)
 Spirituality Group   Description: Participant directed exploration of values, beliefs and meaning   **Focus on: Locating our values   Following a brief framework of chaplains role and ground rules of group behavior, participants are invited to share concerns or questions that engage spiritual life. Emphasis placed on common themes and shared experiences and ways to make meaning and clarify living into ones values.   Theory/Process/Goal: Utilize the theoretical framework of group therapy established by Celena Kite, Relational Cultural Theory and Rogerian approaches to facilitate relational empathy and use of the here and now to foster reflection, self-awareness, and sharing.   Observations: Did not attend  Evanie Buckle L. Delores HERO.Div

## 2024-09-20 DIAGNOSIS — F250 Schizoaffective disorder, bipolar type: Secondary | ICD-10-CM | POA: Diagnosis not present

## 2024-09-20 NOTE — BHH Group Notes (Signed)
 Adult Psychoeducational Group Note  Date:  09/20/2024 Time:  3:17 PM  Group Topic/Focus: Social Work Group  Participation Level:  Did Not Attend  Participation Quality:    Affect:    Cognitive:    Insight:   Engagement in Group:    Modes of Intervention:    Additional Comments:    Taliana Mersereau O 09/20/2024, 3:17 PM

## 2024-09-20 NOTE — Group Note (Signed)
 Date:  09/20/2024 Time:  8:59 PM  Group Topic/Focus:  Wrap-Up Group:   The focus of this group is to help patients review their daily goal of treatment and discuss progress on daily workbooks.    Participation Level:  Did Not Attend   Mehran Guderian Dacosta 09/20/2024, 8:59 PM

## 2024-09-20 NOTE — BHH Group Notes (Signed)
 Adult Psychoeducational Group Note  Date:  09/20/2024 Time:  3:18 PM  Group Topic/Focus: Intellectual Wellness  Participation Level:  Did Not Attend  Participation Quality:    Affect:    Cognitive:    Insight:   Engagement in Group:    Modes of Intervention:    Additional Comments:    [name] 09/20/2024, 3:18 PM

## 2024-09-20 NOTE — Plan of Care (Signed)
" °  Problem: Education: Goal: Knowledge of [facility] Education information/materials will improve Outcome: Progressing Goal: Emotional status will improve Outcome: Progressing Goal: Mental status will improve Outcome: Progressing Goal: Verbalization of understanding the information provided will improve Outcome: Progressing   Problem: Activity: Goal: Interest or engagement in activities will improve Outcome: Progressing Goal: Sleeping patterns will improve Outcome: Progressing   Problem: Coping: Goal: Ability to verbalize frustrations and anger appropriately will improve Outcome: Progressing Goal: Ability to demonstrate self-control will improve Outcome: Progressing   Problem: Health Behavior/Discharge Planning: Goal: Identification of resources available to assist in meeting health care needs will improve Outcome: Progressing Goal: Compliance with treatment plan for underlying cause of condition will improve Outcome: Progressing   Problem: Physical Regulation: Goal: Ability to maintain clinical measurements within normal limits will improve Outcome: Progressing   Problem: Safety: Goal: Periods of time without injury will increase Outcome: Progressing   Problem: Education: Goal: Knowledge of [facility] Education information/materials will improve Outcome: Progressing Goal: Emotional status will improve Outcome: Progressing Goal: Mental status will improve Outcome: Progressing Goal: Verbalization of understanding the information provided will improve Outcome: Progressing   Problem: Activity: Goal: Interest or engagement in activities will improve Outcome: Progressing Goal: Sleeping patterns will improve Outcome: Progressing   Problem: Coping: Goal: Ability to verbalize frustrations and anger appropriately will improve Outcome: Progressing Goal: Ability to demonstrate self-control will improve Outcome: Progressing   Problem: Health  Behavior/Discharge Planning: Goal: Identification of resources available to assist in meeting health care needs will improve Outcome: Progressing Goal: Compliance with treatment plan for underlying cause of condition will improve Outcome: Progressing   Problem: Physical Regulation: Goal: Ability to maintain clinical measurements within normal limits will improve Outcome: Progressing   Problem: Safety: Goal: Periods of time without injury will increase Outcome: Progressing   Problem: Activity: Goal: Will verbalize the importance of balancing activity with adequate rest periods Outcome: Progressing   Problem: Education: Goal: Will be free of psychotic symptoms Outcome: Progressing Goal: Knowledge of the prescribed therapeutic regimen will improve Outcome: Progressing   Problem: Coping: Goal: Coping ability will improve Outcome: Progressing Goal: Will verbalize feelings Outcome: Progressing   Problem: Health Behavior/Discharge Planning: Goal: Compliance with prescribed medication regimen will improve Outcome: Progressing   Problem: Nutritional: Goal: Ability to achieve adequate nutritional intake will improve Outcome: Progressing   Problem: Role Relationship: Goal: Ability to communicate needs accurately will improve Outcome: Progressing Goal: Ability to interact with others will improve Outcome: Progressing   Problem: Safety: Goal: Ability to redirect hostility and anger into socially appropriate behaviors will improve Outcome: Progressing Goal: Ability to remain free from injury will improve Outcome: Progressing   Problem: Self-Care: Goal: Ability to participate in self-care as condition permits will improve Outcome: Progressing   Problem: Self-Concept: Goal: Will verbalize positive feelings about self Outcome: Progressing   Problem: Coping: Goal: Ability to identify and develop effective coping behavior will improve Outcome: Progressing Goal: Ability to  interact with others will improve Outcome: Progressing Goal: Demonstration of participation in decision-making regarding own care will improve Outcome: Progressing Goal: Ability to use eye contact when communicating with others will improve Outcome: Progressing   "

## 2024-09-20 NOTE — Progress Notes (Addendum)
" °   09/20/24 1000  Psych Admission Type (Psych Patients Only)  Admission Status Involuntary  Psychosocial Assessment  Patient Complaints Confusion  Eye Contact Fair  Facial Expression Flat  Affect Preoccupied  Speech Pressured;Tangential  Interaction Assertive  Motor Activity Slow  Appearance/Hygiene Disheveled  Behavior Characteristics Cooperative  Mood Preoccupied  Aggressive Behavior  Effect No apparent injury  Thought Process  Coherency Tangential;Disorganized  Content Preoccupation  Delusions None reported or observed  Perception Hallucinations  Hallucination None reported or observed  Judgment Impaired  Confusion Mild  Danger to Self  Current suicidal ideation? Denies  Danger to Others  Danger to Others None reported or observed    "

## 2024-09-20 NOTE — Group Note (Signed)
 LCSW Group Therapy Note  Group Date: 09/20/2024 Start Time: 1000 End Time: 1100   Type of Therapy and Topic:  Group Therapy - Healthy vs Unhealthy Coping Skills  Participation Level:  Did Not Attend   Description of Group The focus of this group was to determine what unhealthy coping techniques typically are used by group members and what healthy coping techniques would be helpful in coping with various problems. Patients were guided in becoming aware of the differences between healthy and unhealthy coping techniques. Patients were asked to identify 2-3 healthy coping skills they would like to learn to use more effectively.  Therapeutic Goals Patients learned that coping is what human beings do all day long to deal with various situations in their lives Patients defined and discussed healthy vs unhealthy coping techniques Patients identified their preferred coping techniques and identified whether these were healthy or unhealthy Patients determined 2-3 healthy coping skills they would like to become more familiar with and use more often. Patients provided support and ideas to each other   Summary of Patient Progress:  NA   Therapeutic Modalities Cognitive Behavioral Therapy Motivational Interviewing  Takashi Korol O Sadhana Frater, LCSWA 09/20/2024  12:20 PM

## 2024-09-20 NOTE — Group Note (Signed)
 Date:  09/20/2024 Time:  4:17 PM  Group Topic/Focus:  Coping With Mental Health Crisis:   The purpose of this group is to help patients identify strategies for coping with mental health crisis.  Group discusses possible causes of crisis and ways to manage them effectively. Overcoming Stress:   The focus of this group is to define stress and help patients assess their triggers.    Participation Level:  Minimal  Participation Quality:  Inattentive  Affect:  Flat  Cognitive:  Alert  Insight: Lacking  Engagement in Group:  Lacking  Modes of Intervention:  Activity  Additional Comments:    [name] [name] 09/20/2024, 4:17 PM

## 2024-09-20 NOTE — BHH Group Notes (Signed)
 Adult Psychoeducational Group Note  Date:  09/20/2024 Time:  3:19 PM  Group Topic/Focus: Occupational Therapy  Participation Level:  Did Not Attend  Participation Quality:    Affect:    Cognitive:    Insight:   Engagement in Group:    Modes of Intervention:    Additional Comments:    Erlin Gardella O 09/20/2024, 3:19 PM

## 2024-09-20 NOTE — Plan of Care (Signed)

## 2024-09-20 NOTE — BHH Group Notes (Signed)
 Adult Psychoeducational Group Note  Date:  09/20/2024 Time:  10:05 AM  Group Topic/Focus:  Goals Group:   The focus of this group is to help patients establish daily goals to achieve during treatment and discuss how the patient can incorporate goal setting into their daily lives to aide in recovery.  Participation Level:  Did Not Attend  Participation Quality:    Affect:    Cognitive:    Insight:   Engagement in Group:    Modes of Intervention:    Additional Comments:    [name] 09/20/2024, 10:05 AM

## 2024-09-20 NOTE — Group Note (Signed)
 LCSW Group Therapy Note  Group Date: 09/20/2024 Start Time: 1030 End Time: 1130   Type of Therapy and Topic:  Group Therapy: Anger Cues and Responses  Participation Level:  Did Not Attend   [name] 09/20/2024  6:17 PM

## 2024-09-20 NOTE — Progress Notes (Addendum)
 Pt presented very groggy,sedated this morning -refused her Depakote , was insistent that she already had it this morning. Reassured pt that it wasn't documented that she had received her Depakote  this morning, but she remained adamant that she had received several pills this morning and didn't want to be more sedated. MD made aware

## 2024-09-20 NOTE — Progress Notes (Signed)
 [facility] Inpatient Psychiatry Progress Note  Date: 09/20/24 Patient: [name] MRN: [id number]  Assessment and Plan: [name] is a 54 y.o. female with a past psychiatric schizoaffective bipolar type, transported to the [facility] emergency department for disorganized thoughts, paranoia and refusal to take medications. She was petitioned for IVC by her ACT Team. She was transferred to the [facility] for symptomatic stabilization and medication management.  Assessment this morning, patient with some improvements in linearity and less overtly delusional content or disorganization. Collateral with IVC petitioner about patient's behavior stated at baseline the patient might have some fixed delusions, however would be compliant with medications and have less disorganized thought process. Patient currently on clozapine , will continue with monotherapy for now given some improvement and additional needed time to assess optimization on clozapine . Patient will be approaching a week on monotherapy on Sunday, can consider possible dual antipsychotic therapy if indicated at that time.   (Fluphenazine  might be worth adding if the patient gets the max dose Clozaril  without remission of symptoms).   # Schizoaffective disorder, bipolar type (HCC) -- Discontinue Depakote  500 mg due to oversedation -- Clozaril  increased to 125 mg on 1/23 at bedtime for schizoaffective disorder -- Metabolic profile and EKG monitoring obtained while on an atypical antipsychotic  (BMI: 35.55  Lipid Panel: LDL 109  HbgA1c: 0.6  QTc: 459 -- Clozapine  labs ordered include: CBC WNL , troponins Neg, proBNP negative - CK 107 and CK-MB 1.8  - CRP patient refused labs  - TSH WNL - RPR Neg --- Consider metformin  500 mg daily for antipsychotic induced metabolic syndrome, bowel regularity  Risk Assessment - Mild: Patient with a prior history of suicidal ideations and suicide attempt,  patient not currently endorsing any suicidal thoughts, however does not have great executive function given current psychiatric decompensation.  Patient also noncompliant on medications which puts her at an increased risk of self-harm   Discharge Planning Barriers to discharge: Medication compliance, Safety planning, contact with ACT Team  Estimated length of stay: 7- 14 days  Predicted Discharge location: Home   Interval History and update:  No acute events overnight.  Patient noncompliant with morning medications prior to discussion with psychiatrist, nursing later brought the patient her medications.  No PRNs administered.     Patient this morning was more somnolent than previous reports, nursing staff reports that patient while new to them seems more sluggish than the reports they have gotten on previous days.  The team has been gradually tapering her Depakote  downward while titrating upward her Clozaril .  Patient fixated on when she is leaving.  Grossly disorganized, mumbling to herself frequently during interview.  She denies any significant depression or anxiety. She denies SI,HI, AVH.  It is unclear whether the patient is in fact responding to internal stimuli or merely mumbling to herself. Patient did make some statements about someone coming to the house and assaulting her.    [name] Daughter, [phone] attempted x1 09/19/2024 11:25 Attempted to call daughter for further collateral and see if she has noticed in progress in mom,   Rea Acton: IVC Petitioner, ACT Team Member, [phone])09/17/2024  Pt had been refusing medications the last 2 months. She was experiencing flight of ideas, exhibited disorganized behavior and was very tangential. In the past, she has accepted the injection and symptoms and here symptoms better improved. She would start to make more sense and experience some fixed delusions.   Silvano Salisbury ACT Team Nurse, [phone],  09/18/2024   Attempts x2  at 1:37 PM   09/17/2024  Attempted to call and left a voicemail 2:37 PM   Physical Exam MSK/Neuro -patient is alert to person, self, place no focal deficits noted on observation Mental Status Exam Appearance - Casually dressed, disheveled, laying in bed Attitude - Calm, polite, not guarded Speech -normal prosody, inflection Mood -okay Affect -congruent Thought Process -disorganized  Thought Content - suspect some fixed delusions,  TC expressed  SI/HI - Denies  Perceptions - Denies AVH; not RIS Judgement/Insight - Impaired  Fund of knowledge - Lacking  Language - No impairments  Lab Results:  Admission on 09/12/2024  Component Date Value Ref Range Status   Total CK 09/14/2024 107  38 - 234 U/L Final   CK, MB 09/14/2024 1.8  0.5 - 5.0 ng/mL Final   Pro Brain Natriuretic Peptide 09/14/2024 <50.0  <300.0 pg/mL Final   Hgb A1c MFr Bld 09/14/2024 5.6  4.8 - 5.6 % Final   Mean Plasma Glucose 09/14/2024 114.02  mg/dL Final   TSH 98/81/7973 0.713  0.350 - 4.500 uIU/mL Final   Cholesterol 09/14/2024 170  0 - 200 mg/dL Final   Triglycerides 98/81/7973 67  <150 mg/dL Final   HDL 98/81/7973 47  >40 mg/dL Final   Total CHOL/HDL Ratio 09/14/2024 3.6  RATIO Final   VLDL 09/14/2024 13  0 - 40 mg/dL Final   LDL Cholesterol 09/14/2024 109 (H)  0 - 99 mg/dL Final   RPR Ser Ql 98/81/7973 NON REACTIVE  NON REACTIVE Final   WBC 09/14/2024 4.8  4.0 - 10.5 K/uL Final   RBC 09/14/2024 4.16  3.87 - 5.11 MIL/uL Final   Hemoglobin 09/14/2024 12.5  12.0 - 15.0 g/dL Final   HCT 98/81/7973 37.6  36.0 - 46.0 % Final   MCV 09/14/2024 90.4  80.0 - 100.0 fL Final   MCH 09/14/2024 30.0  26.0 - 34.0 pg Final   MCHC 09/14/2024 33.2  30.0 - 36.0 g/dL Final   RDW 98/81/7973 12.9  11.5 - 15.5 % Final   Platelets 09/14/2024 255  150 - 400 K/uL Final   nRBC 09/14/2024 0.0  0.0 - 0.2 % Final   Neutrophils Relative % 09/14/2024 38  % Final   Neutro Abs 09/14/2024 1.8  1.7 - 7.7 K/uL Final   Lymphocytes Relative  09/14/2024 47  % Final   Lymphs Abs 09/14/2024 2.3  0.7 - 4.0 K/uL Final   Monocytes Relative 09/14/2024 7  % Final   Monocytes Absolute 09/14/2024 0.3  0.1 - 1.0 K/uL Final   Eosinophils Relative 09/14/2024 7  % Final   Eosinophils Absolute 09/14/2024 0.3  0.0 - 0.5 K/uL Final   Basophils Relative 09/14/2024 1  % Final   Basophils Absolute 09/14/2024 0.0  0.0 - 0.1 K/uL Final   Immature Granulocytes 09/14/2024 0  % Final   Abs Immature Granulocytes 09/14/2024 0.01  0.00 - 0.07 K/uL Final   Troponin T High Sensitivity 09/14/2024 <15  0 - 19 ng/L Final    Vitals: Blood pressure (!) 130/91, pulse 86, temperature 97.6 F (36.4 C), temperature source Oral, resp. rate 18, height 5' 7 (1.702 m), weight 103 kg, SpO2 100%.    Signed: JINNY Morene GORMAN Delsie, MD [facility] Physician, PGY-2 09/20/2024 2:33 PM

## 2024-09-20 NOTE — BH Assessment (Signed)
(  Sleep Hours) - 16.5 (Any PRNs that were needed, meds refused, or side effects to meds)-  (Any disturbances and when (visitation, over night)- None (Concerns raised by the patient)- None (SI/HI/AVH)- Denies with noted confusion

## 2024-09-21 DIAGNOSIS — F250 Schizoaffective disorder, bipolar type: Secondary | ICD-10-CM | POA: Diagnosis not present

## 2024-09-21 MED ORDER — CLOZAPINE 25 MG PO TABS
150.0000 mg | ORAL_TABLET | Freq: Every day | ORAL | Status: DC
  Administered 2024-09-21 – 2024-09-22 (×2): 150 mg via ORAL
  Filled 2024-09-21 (×2): qty 2

## 2024-09-21 NOTE — BHH Group Notes (Signed)
 Adult Psychoeducational Group Note  Date:  09/21/2024 Time:  9:17 AM  Group Topic/Focus:  Goals Group:   The focus of this group is to help patients establish daily goals to achieve during treatment and discuss how the patient can incorporate goal setting into their daily lives to aide in recovery.  Participation Level:  Did Not Attend  Participation Quality:    Affect:    Cognitive:   Insight:   Engagement in Group:    Modes of Intervention:    Additional Comments:    [name] 09/21/2024, 9:17 AM

## 2024-09-21 NOTE — Plan of Care (Signed)
   Problem: Health Behavior/Discharge Planning: Goal: Compliance with treatment plan for underlying cause of condition will improve Outcome: Progressing   Problem: Safety: Goal: Periods of time without injury will increase Outcome: Progressing

## 2024-09-21 NOTE — Progress Notes (Signed)
 D. Pt presents less sedated today, went down for breakfast, lunch and dinner with peers. Pt continues to present disorganized, denying SI/HI and A/VH. A. Labs and vitals monitored. . Pt supported emotionally and encouraged to express concerns and ask questions.   R. Pt remains safe with 15 minute checks. Will continue POC.

## 2024-09-21 NOTE — BHH Group Notes (Signed)
 Adult Psychoeducational Group Note  Date:  09/21/2024 Time:  9:13 PM  Group Topic/Focus:  Wrap-Up Group:   The focus of this group is to help patients review their daily goal of treatment and discuss progress on daily workbooks.  Participation Level:  Did Not Attend  Participation Quality:    Affect:    Cognitive:    Insight:   Engagement in Group:    Modes of Intervention:    Additional Comments:    [name] 09/21/2024, 9:13 PM

## 2024-09-21 NOTE — Progress Notes (Signed)
(  Sleep Hours) -9.25  (Any PRNs that were needed, meds refused, or side effects to meds)- none  (Any disturbances and when (visitation, over night)-none  (Concerns raised by the patient)- none  (SI/HI/AVH)-AH

## 2024-09-21 NOTE — Plan of Care (Signed)
  Problem: Activity: Goal: Sleeping patterns will improve Outcome: Progressing   Problem: Coping: Goal: Ability to demonstrate self-control will improve Outcome: Progressing   Problem: Safety: Goal: Periods of time without injury will increase Outcome: Progressing   Problem: Activity: Goal: Interest or engagement in activities will improve Outcome: Not Progressing

## 2024-09-21 NOTE — Progress Notes (Signed)
 [facility] Inpatient Psychiatry Progress Note  Date: 09/21/24 Patient: [name] MRN: [id number]  Assessment and Plan: [name] is a 54 y.o. female with a past psychiatric schizoaffective bipolar type, transported to the [facility] emergency department for disorganized thoughts, paranoia and refusal to take medications. She was petitioned for IVC by her ACT Team. She was transferred to the [facility] for symptomatic stabilization and medication management.  1/25: Patient continues to exhibit disorganized and very tangential speech.  Patient is eating and sleeping well and denying suicidal/homicidal ideation as well as auditory/visual hallucinations.  However, during the back half of patient's interview she exhibited very clear flight of ideas a.m. of 1/25 -- will increase clozapine  to 150 mg nightly and continue to monitor.  Clozapine  review of symptoms was negative: No chest pain, shortness of breath, orthostasis, palpitations, fevers and chills or constipation.   # Schizoaffective disorder, bipolar type (HCC) -- Discontinue Depakote  500 mg due to oversedation -- Clozaril  increased 125 ? 150 mg on 1/25 at bedtime for schizoaffective disorder -- Metabolic profile and EKG monitoring obtained while on an atypical antipsychotic  (BMI: 35.55  Lipid Panel: LDL 109  HbgA1c: 0.6  QTc: 459 -- Clozapine  labs ordered include: CBC WNL , troponins Neg, proBNP negative - CK 107 and CK-MB 1.8  - CRP patient refused labs  - TSH WNL - RPR Neg --- Consider metformin  500 mg daily for antipsychotic induced metabolic syndrome, bowel regularity   #Tachycardic to 106  -Continue to monitor, consider propranolol  Risk Assessment - Mild: Patient with a prior history of suicidal ideations and suicide attempt, patient not currently endorsing any suicidal thoughts, however does not have great executive function given current psychiatric decompensation.   Patient also noncompliant on medications which puts her at an increased risk of self-harm   Discharge Planning Barriers to discharge: Medication compliance, Safety planning, contact with ACT Team  Estimated length of stay: 7- 14 days  Predicted Discharge location: Home   Interval History and update:  146/85. HR 106. 11 hours. No SI, HI and AVH. Rambling during assessment last night. No PRNs.    Patient was somewhat organized on initial interview and could respond to close ended questions normally -- noted that she was good and that her mood was fine.  Was eating and sleeping well.  However, on back after conversation patient exhibited very clear flight of ideas-largely unintelligible content regarding how family's are normal in [place]  and how the sheriff has profiled her and is out to get her.  Appears that patient does not believe that she is mentally ill.  Gave permission to reach out to daughter.  Also some concern about being able to get her home -- said that, worse comes to worst we can provide a taxi.  Denied: Chest pain, shortness of breath, orthostasis, palpitations, constipation as well as fever and chills.  [name], Daughter, [phone] attempted x1 09/21/2024 3:43 pm: Attempted to call with no response.  Rea Acton: IVC Petitioner, ACT Team Member, [phone])09/17/2024  Pt had been refusing medications the last 2 months. She was experiencing flight of ideas, exhibited disorganized behavior and was very tangential. In the past, she has accepted the injection and symptoms and here symptoms better improved. She would start to make more sense and experience some fixed delusions.   Silvano Salisbury ACT Team Nurse, [phone],   09/18/2024   Attempts x2 at 1:37 PM   09/17/2024  Attempted to call and left a voicemail  2:37 PM   Physical Exam MSK/Neuro -patient is alert to person, self, place no focal deficits noted on observation Mental Status Exam Appearance -  Casually dressed, disheveled, laying in bed Attitude - Calm, polite, not guarded Speech -normal prosody, inflection Mood -fine Affect -congruent Thought Process -disorganized, tangential, flight of ideas Thought Content - suspect some fixed delusions,  TC expressed  SI/HI - Denies  Perceptions - Denies AVH; not RIS Judgement/Insight - Impaired  Fund of knowledge - Lacking  Language - No impairments  Lab Results:  Admission on 09/12/2024  Component Date Value Ref Range Status   Total CK 09/14/2024 107  38 - 234 U/L Final   CK, MB 09/14/2024 1.8  0.5 - 5.0 ng/mL Final   Pro Brain Natriuretic Peptide 09/14/2024 <50.0  <300.0 pg/mL Final   Hgb A1c MFr Bld 09/14/2024 5.6  4.8 - 5.6 % Final   Mean Plasma Glucose 09/14/2024 114.02  mg/dL Final   TSH 98/81/7973 0.713  0.350 - 4.500 uIU/mL Final   Cholesterol 09/14/2024 170  0 - 200 mg/dL Final   Triglycerides 98/81/7973 67  <150 mg/dL Final   HDL 98/81/7973 47  >40 mg/dL Final   Total CHOL/HDL Ratio 09/14/2024 3.6  RATIO Final   VLDL 09/14/2024 13  0 - 40 mg/dL Final   LDL Cholesterol 09/14/2024 109 (H)  0 - 99 mg/dL Final   RPR Ser Ql 98/81/7973 NON REACTIVE  NON REACTIVE Final   WBC 09/14/2024 4.8  4.0 - 10.5 K/uL Final   RBC 09/14/2024 4.16  3.87 - 5.11 MIL/uL Final   Hemoglobin 09/14/2024 12.5  12.0 - 15.0 g/dL Final   HCT 98/81/7973 37.6  36.0 - 46.0 % Final   MCV 09/14/2024 90.4  80.0 - 100.0 fL Final   MCH 09/14/2024 30.0  26.0 - 34.0 pg Final   MCHC 09/14/2024 33.2  30.0 - 36.0 g/dL Final   RDW 98/81/7973 12.9  11.5 - 15.5 % Final   Platelets 09/14/2024 255  150 - 400 K/uL Final   nRBC 09/14/2024 0.0  0.0 - 0.2 % Final   Neutrophils Relative % 09/14/2024 38  % Final   Neutro Abs 09/14/2024 1.8  1.7 - 7.7 K/uL Final   Lymphocytes Relative 09/14/2024 47  % Final   Lymphs Abs 09/14/2024 2.3  0.7 - 4.0 K/uL Final   Monocytes Relative 09/14/2024 7  % Final   Monocytes Absolute 09/14/2024 0.3  0.1 - 1.0 K/uL Final    Eosinophils Relative 09/14/2024 7  % Final   Eosinophils Absolute 09/14/2024 0.3  0.0 - 0.5 K/uL Final   Basophils Relative 09/14/2024 1  % Final   Basophils Absolute 09/14/2024 0.0  0.0 - 0.1 K/uL Final   Immature Granulocytes 09/14/2024 0  % Final   Abs Immature Granulocytes 09/14/2024 0.01  0.00 - 0.07 K/uL Final   Troponin T High Sensitivity 09/14/2024 <15  0 - 19 ng/L Final    Vitals: Blood pressure (!) 146/85, pulse (!) 106, temperature 98 F (36.7 C), temperature source Oral, resp. rate 16, height 5' 7 (1.702 m), weight 103 kg, SpO2 98%.    Signed: Odis Cleveland, MD [facility] Physician, PGY-2 09/21/2024 9:16 AM

## 2024-09-21 NOTE — BHH Group Notes (Signed)
 Adult Psychoeducational Group Note  Date:  09/21/2024 Time:  4:27 PM  Group Topic/Focus:  Wellness Toolbox:   The focus of this group is to discuss various aspects of wellness, balancing those aspects and exploring ways to increase the ability to experience wellness.  Patients will create a wellness toolbox for use upon discharge.  Participation Level:  Did Not Attend  Participation Quality:    Affect:    Cognitive:    Insight:   Engagement in Group:    Modes of Intervention:    Additional Comments:    [name] 09/21/2024, 4:27 PM

## 2024-09-21 NOTE — BHH Group Notes (Signed)
 Adult Psychoeducational Group Note  Date:  09/21/2024 Time:  3:27 PM  Group Topic/Focus:  Developing a Wellness Toolbox:   The focus of this group is to help patients develop a wellness toolbox with skills and strategies to promote recovery upon discharge.  Participation Level:  Did Not Attend  Participation Quality:    Affect:    Cognitive:    Insight:   Engagement in Group:    Modes of Intervention:    Additional Comments:    [name] 09/21/2024, 3:27 PM

## 2024-09-21 NOTE — Plan of Care (Signed)
(  Sleep Hours) - 11 hours (Any PRNs that were needed, meds refused, or side effects to meds)- none (Any disturbances and when (visitation, over night)- none (Concerns raised by the patient)- none. Patient rambling during assessment, disorganized thinking noted. (SI/HI/AVH)- denies all  Problem: Activity: Goal: Sleeping patterns will improve Outcome: Progressing   Problem: Education: Goal: Mental status will improve Outcome: Not Progressing   Problem: Activity: Goal: Interest or engagement in activities will improve Outcome: Not Progressing   Problem: Coping: Goal: Ability to demonstrate self-control will improve Outcome: Not Progressing

## 2024-09-22 DIAGNOSIS — F250 Schizoaffective disorder, bipolar type: Secondary | ICD-10-CM | POA: Diagnosis not present

## 2024-09-22 LAB — CBC WITH DIFFERENTIAL/PLATELET
Abs Immature Granulocytes: 0.03 10*3/uL (ref 0.00–0.07)
Basophils Absolute: 0 10*3/uL (ref 0.0–0.1)
Basophils Relative: 0 %
Eosinophils Absolute: 0.3 10*3/uL (ref 0.0–0.5)
Eosinophils Relative: 5 %
HCT: 36 % (ref 36.0–46.0)
Hemoglobin: 11.7 g/dL — ABNORMAL LOW (ref 12.0–15.0)
Immature Granulocytes: 1 %
Lymphocytes Relative: 32 %
Lymphs Abs: 1.7 10*3/uL (ref 0.7–4.0)
MCH: 30.1 pg (ref 26.0–34.0)
MCHC: 32.5 g/dL (ref 30.0–36.0)
MCV: 92.5 fL (ref 80.0–100.0)
Monocytes Absolute: 0.3 10*3/uL (ref 0.1–1.0)
Monocytes Relative: 6 %
Neutro Abs: 3.1 10*3/uL (ref 1.7–7.7)
Neutrophils Relative %: 56 %
Platelets: 207 10*3/uL (ref 150–400)
RBC: 3.89 MIL/uL (ref 3.87–5.11)
RDW: 13.2 % (ref 11.5–15.5)
WBC: 5.4 10*3/uL (ref 4.0–10.5)
nRBC: 0 % (ref 0.0–0.2)

## 2024-09-22 NOTE — BHH Group Notes (Signed)
 Adult Psychoeducational Group Note  Date:  09/22/2024 Time:  11:30 AM  Group Topic/Focus: Recreation Group   Participation Level:  Did Not Attend    [name] 09/22/2024, 11:30 AM

## 2024-09-22 NOTE — Group Note (Signed)
 LCSW Group Therapy Note   Group Date: 09/22/2024 Start Time: 1300 End Time: 1345   Participation:  did not attend, patient left within first 5 minutes of group session.    Type of Therapy:  Group Therapy    Topic:  Stronger Together:  Building Healthy Relationships   Objective:  To explore loneliness, boundaries, and safe ways to build relationships.   Goals: Recognize healthy vs. unhealthy relationships. Learn safe ways to connect with others. Strengthen communication and murphy oil.   Summary:  Participants discussed loneliness, healthy connections, and setting boundaries. They explored safe ways to meet people and shared personal experiences. Key insights were reinforced through discussion and quotes.   Therapeutic Modalities Used: - Cognitive Behavioral Therapy (CBT) Elements - Identifying unhealthy relationship patterns, challenging negative thoughts about connection. - Dialectical Behavior Therapy (DBT) Elements - Interpersonal effectiveness, setting and maintaining boundaries. - Supportive Group Therapy - Peer discussion, shared experiences, and emotional validation.    Louetta Lame, LCSWA 09/22/2024  4:06 PM

## 2024-09-22 NOTE — Progress Notes (Signed)
(  Sleep Hours) -10.75  (Any PRNs that were needed, meds refused, or side effects to meds)- none  (Any disturbances and when (visitation, over night)-none  (Concerns raised by the patient)- denies  (SI/HI/AVH)-denies

## 2024-09-22 NOTE — BHH Group Notes (Signed)
 Adult Psychoeducational Group Note  Date:  09/22/2024 Time:  10:06 AM  Group Topic/Focus:  Goals Group:   The focus of this group is to help patients establish daily goals to achieve during treatment and discuss how the patient can incorporate goal setting into their daily lives to aide in recovery.  Participation Level:  Did Not Attend  Participation Quality:    Affect:    Cognitive:    Insight:   Engagement in Group:    Modes of Intervention:    Additional Comments:    [name] 09/22/2024, 10:06 AM

## 2024-09-22 NOTE — BHH Group Notes (Signed)
 Adult Psychoeducational Group Note  Date:  09/22/2024 Time:  1:37 PM Group Topic/Focus: Social Worker Group   Participation Level:  Did Not Attend    [name] 09/22/2024, 1:37 PM

## 2024-09-22 NOTE — BHH Group Notes (Signed)
 Adult Psychoeducational Group Note  Date:  09/22/2024 Time:  11:37 AM  Group Topic/Focus: Physical Wellness Group   Participation Level:  Did Not Attend    [name] Ruth 09/22/2024, 11:37 AM

## 2024-09-22 NOTE — Plan of Care (Signed)
  Problem: Education: Goal: Emotional status will improve Outcome: Progressing   Problem: Activity: Goal: Interest or engagement in activities will improve Outcome: Progressing Goal: Sleeping patterns will improve Outcome: Progressing

## 2024-09-22 NOTE — Group Note (Signed)
 Date:  09/22/2024 Time:  8:42 PM  Group Topic/Focus:  Wrap-Up Group:   The focus of this group is to help patients review their daily goal of treatment and discuss progress on daily workbooks.    Participation Level:  Active  Participation Quality:  Appropriate  Affect:  Appropriate  Cognitive:  Appropriate  Insight: Appropriate  Engagement in Group:  Engaged  Modes of Intervention:  Education  Additional Comments:  Patient attended and participated in group tonight. She reports that her goal for today was to speak with her daughter. She stated that he will pick her up upon discharge.  [name] 09/22/2024, 8:42 PM

## 2024-09-22 NOTE — Group Note (Signed)
 Date:  09/22/2024 Time:  4:05 PM  Group Topic/Focus:  Wellness Toolbox:   The focus of this group is to discuss various aspects of wellness, balancing those aspects and exploring ways to increase the ability to experience wellness.  Patients will create a wellness toolbox for use upon discharge.    Participation Level:  Did Not Attend   Almarie MALVA Lowers 09/22/2024, 4:05 PM

## 2024-09-22 NOTE — Progress Notes (Signed)
 [facility] Inpatient Psychiatry Progress Note  Date: 09/22/24 Patient: [name] MRN: [id number]  Assessment and Plan: Allure Greaser is a 54 y.o. female with a past psychiatric schizoaffective bipolar type, transported to the [facility] emergency department for disorganized thoughts, paranoia and refusal to take medications. She was petitioned for IVC by her ACT Team. She was transferred to the [facility] for symptomatic stabilization and medication management.  1/26: Patient appears less sedated, however still remains extremely disorganized.  After an extended conversation it is unclear whether or not patient is actually passing bowel movements -- patient endorses passing solid stools 2 days ago.  Had been refusing daily MiraLAX  but took some today.  Will monitor for further issues.  Was unable to reach daughter again today, will call ACT team tomorrow when services are resumed.  Expect discharge no sooner than next week given degree of flight of ideas.   # Schizoaffective disorder, bipolar type (HCC) -- Continue Clozaril  increased 150 mg at bedtime for schizoaffective disorder -- Metabolic profile and EKG monitoring obtained while on an atypical antipsychotic  BMI: 35.55  Lipid Panel: LDL 109  HbgA1c: 0.6  QTc: 459 -- Clozapine  labs ordered include: CBC WNL , troponins Neg, proBNP negative - CK 107 and CK-MB 1.8  - CRP patient refused labs  - TSH WNL - RPR Neg --- Consider metformin  500 mg daily for antipsychotic induced metabolic syndrome, bowel regularity   #Tachycardia -Continue to monitor, consider propranolol  Risk Assessment - Mild: Patient with a prior history of suicidal ideations and suicide attempt, patient not currently endorsing any suicidal thoughts, however does not have great executive function given current psychiatric decompensation.  Patient also noncompliant on medications which puts her at an increased risk of  self-harm   Discharge Planning Barriers to discharge: Medication compliance, Safety planning, contact with ACT Team  Estimated length of stay: 7- 13 days  Predicted Discharge location: Home   Interval History and update:  HR 98. 128/84. No medication refusals. No PRNs. Slept 9.25 hours. Endorsed auditory hallucinations overnight. No concerns. Less sedated yesterday.    On interview: Conducted in day room, offered to go to privacy of patient's room for interview -- she refused. Patient endorses less sedation today -- was able to get to breakfast.  That said, the remainder of interview is limited by patient's degree of disorganization.  Denies homicidal and suicidal ideation.  Denies auditory and visual hallucinations.  Patient says that she last passed solid stools 2 days ago but that sometimes she does not make it to the bathroom by the time she needs to use it. (Patient is walker-assisted).  Says that this has been going on all my life.  Otherwise sleeping and eating well.  No medication side effects.  When discussing the possible side effect of hypersalivation, patient says I am not Pavlov, you cannot measure my spit.)  Review of symptoms, specific for clozapine : Chest pain: denies Shortness of breath: denies  Exertional capacity: denies Tachycardia: None in morning vitals, denies  Cough: denies Fever: Normal temperature, denies Sedation: denies Orthostatic hypotension: denied Hypersalivation: denied Constipation: last solid BM two days ago Symptoms of GERD: denied Nausea: denied Nocturnal enuresis: endorsed some incontinence before she can reach the bathroom (assisted by walker.    [name], Daughter, [phone] attempted x1 09/22/2024 11:55 pm: Did not respond.  [name]: IVC Petitioner, ACT Team Member, [phone])09/17/2024  Pt had been refusing medications the last 2 months. She was experiencing flight  of ideas, exhibited disorganized behavior and was very  tangential. In the past, she has accepted the injection and symptoms and here symptoms better improved. She would start to make more sense and experience some fixed delusions.   Silvano Salisbury ACT Team Nurse, [phone],   09/18/2024   Attempts x2 at 1:37 PM   09/17/2024  Attempted to call and left a voicemail 2:37 PM   Physical Exam MSK/Neuro -patient is alert to person, self, place no focal deficits noted on observation Mental Status Exam Appearance - Casually dressed, disheveled, malodorous, sitting in day room Attitude - Calm, polite, not guarded Speech -normal prosody, inflection Mood -fine Affect -congruent Thought Process -disorganized, tangential, flight of ideas Thought Content - suspect some fixed delusions,  TC expressed  SI/HI - Denies  Perceptions - Denies AVH; not RIS Judgement/Insight - Impaired  Fund of knowledge - Lacking  Language - No impairments  Lab Results:  Admission on 09/12/2024  Component Date Value Ref Range Status   Total CK 09/14/2024 107  38 - 234 U/L Final   CK, MB 09/14/2024 1.8  0.5 - 5.0 ng/mL Final   Pro Brain Natriuretic Peptide 09/14/2024 <50.0  <300.0 pg/mL Final   Hgb A1c MFr Bld 09/14/2024 5.6  4.8 - 5.6 % Final   Mean Plasma Glucose 09/14/2024 114.02  mg/dL Final   TSH 98/81/7973 0.713  0.350 - 4.500 uIU/mL Final   Cholesterol 09/14/2024 170  0 - 200 mg/dL Final   Triglycerides 98/81/7973 67  <150 mg/dL Final   HDL 98/81/7973 47  >40 mg/dL Final   Total CHOL/HDL Ratio 09/14/2024 3.6  RATIO Final   VLDL 09/14/2024 13  0 - 40 mg/dL Final   LDL Cholesterol 09/14/2024 109 (H)  0 - 99 mg/dL Final   RPR Ser Ql 98/81/7973 NON REACTIVE  NON REACTIVE Final   WBC 09/14/2024 4.8  4.0 - 10.5 K/uL Final   RBC 09/14/2024 4.16  3.87 - 5.11 MIL/uL Final   Hemoglobin 09/14/2024 12.5  12.0 - 15.0 g/dL Final   HCT 98/81/7973 37.6  36.0 - 46.0 % Final   MCV 09/14/2024 90.4  80.0 - 100.0 fL Final   MCH 09/14/2024 30.0  26.0 - 34.0 pg Final    MCHC 09/14/2024 33.2  30.0 - 36.0 g/dL Final   RDW 98/81/7973 12.9  11.5 - 15.5 % Final   Platelets 09/14/2024 255  150 - 400 K/uL Final   nRBC 09/14/2024 0.0  0.0 - 0.2 % Final   Neutrophils Relative % 09/14/2024 38  % Final   Neutro Abs 09/14/2024 1.8  1.7 - 7.7 K/uL Final   Lymphocytes Relative 09/14/2024 47  % Final   Lymphs Abs 09/14/2024 2.3  0.7 - 4.0 K/uL Final   Monocytes Relative 09/14/2024 7  % Final   Monocytes Absolute 09/14/2024 0.3  0.1 - 1.0 K/uL Final   Eosinophils Relative 09/14/2024 7  % Final   Eosinophils Absolute 09/14/2024 0.3  0.0 - 0.5 K/uL Final   Basophils Relative 09/14/2024 1  % Final   Basophils Absolute 09/14/2024 0.0  0.0 - 0.1 K/uL Final   Immature Granulocytes 09/14/2024 0  % Final   Abs Immature Granulocytes 09/14/2024 0.01  0.00 - 0.07 K/uL Final   Troponin T High Sensitivity 09/14/2024 <15  0 - 19 ng/L Final    Vitals: Blood pressure 128/84, pulse 98, temperature 98.3 F (36.8 C), temperature source Oral, resp. rate 16, height 5' 7 (1.702 m), weight 103 kg, SpO2 99%.  Signed: Odis Cleveland, MD [facility] Physician, PGY-2 09/22/2024 8:07 AM

## 2024-09-23 DIAGNOSIS — F250 Schizoaffective disorder, bipolar type: Secondary | ICD-10-CM | POA: Diagnosis not present

## 2024-09-23 LAB — CBC WITH DIFFERENTIAL/PLATELET
Abs Immature Granulocytes: 0.02 10*3/uL (ref 0.00–0.07)
Basophils Absolute: 0 10*3/uL (ref 0.0–0.1)
Basophils Relative: 1 %
Eosinophils Absolute: 0.5 10*3/uL (ref 0.0–0.5)
Eosinophils Relative: 8 %
HCT: 38.9 % (ref 36.0–46.0)
Hemoglobin: 12.4 g/dL (ref 12.0–15.0)
Immature Granulocytes: 0 %
Lymphocytes Relative: 41 %
Lymphs Abs: 2.5 10*3/uL (ref 0.7–4.0)
MCH: 29.2 pg (ref 26.0–34.0)
MCHC: 31.9 g/dL (ref 30.0–36.0)
MCV: 91.7 fL (ref 80.0–100.0)
Monocytes Absolute: 0.4 10*3/uL (ref 0.1–1.0)
Monocytes Relative: 6 %
Neutro Abs: 2.6 10*3/uL (ref 1.7–7.7)
Neutrophils Relative %: 44 %
Platelets: 224 10*3/uL (ref 150–400)
RBC: 4.24 MIL/uL (ref 3.87–5.11)
RDW: 13.2 % (ref 11.5–15.5)
WBC: 6 10*3/uL (ref 4.0–10.5)
nRBC: 0 % (ref 0.0–0.2)

## 2024-09-23 MED ORDER — CLOZAPINE 100 MG PO TABS
200.0000 mg | ORAL_TABLET | Freq: Every day | ORAL | Status: DC
  Administered 2024-09-23 – 2024-09-24 (×2): 200 mg via ORAL
  Filled 2024-09-23 (×2): qty 2

## 2024-09-23 NOTE — Plan of Care (Signed)
  Problem: Education: Goal: Mental status will improve Outcome: Not Progressing   Problem: Activity: Goal: Interest or engagement in activities will improve Outcome: Not Progressing

## 2024-09-23 NOTE — Group Note (Signed)
 Recreation Therapy Group Note   Group Topic:Anger Management  Group Date: 09/23/2024 Start Time: 1045 End Time: 1110 Facilitators: Anton Cheramie-McCall, LRT,CTRS Location: [address] Dayroom   Group Topic: Anger Management  Goal Area(s) Addresses:  Patient will define what triggers anger. Patient will identify a situations that trigger them. Patient will identify coping skills used to address triggers.  Behavioral Response:   Intervention: Conversation with group, and worksheets  Activity: Patient discussed anger and what triggers it. Patient then identified their three biggest triggers. Patient went on to identify how to avoid/reduce exposure to triggers and lastly how to deal with triggers head on.  Education: Anger Management, Discharge Planning   Education Outcome: Acknowledges education/In group clarification offered/Needs additional education.    Affect/Mood: N/A   Participation Level: Did not attend    Clinical Observations/Individualized Feedback:      Plan: Continue to engage patient in RT group sessions 2-3x/week.   [name], LRT,CTRS 09/23/2024 1:44 PM

## 2024-09-23 NOTE — BHH Group Notes (Signed)
 Adult Psychoeducational Group Note  Date:  09/23/2024 Time:  6:58 PM  Group Topic/Focus:  Wellness Toolbox:   The focus of this group is to discuss various aspects of wellness, balancing those aspects and exploring ways to increase the ability to experience wellness.  Patients will create a wellness toolbox for use upon discharge.  Participation Level:  Did Not Attend  Participation Quality:    Affect:    Cognitive:    Insight:   Engagement in Group:    Modes of Intervention:    Additional Comments:    [name] 09/23/2024, 6:58 PM

## 2024-09-23 NOTE — Progress Notes (Signed)
(  Sleep Hours) -6.75  (Any PRNs that were needed, meds refused, or side effects to meds)- none  (Any disturbances and when (visitation, over night)-none  (Concerns raised by the patient)- pt continues to be disorganized with tangential speech at times  (SI/HI/AVH)-AH

## 2024-09-23 NOTE — Group Note (Signed)
 Date:  09/23/2024 Time:  4:44 PM  Group Topic/Focus:  Wellness Toolbox:   The focus of this group is to discuss various aspects of wellness, balancing those aspects and exploring ways to increase the ability to experience wellness.  Patients will create a wellness toolbox for use upon discharge.    Participation Level:  Active  Participation Quality:  Appropriate and Attentive  Affect:  Appropriate  Cognitive:  Alert and Appropriate  Insight: Appropriate and Good  Engagement in Group:  Engaged  Modes of Intervention:  Activity and Support    Almarie MALVA Lowers 09/23/2024, 4:44 PM

## 2024-09-23 NOTE — Progress Notes (Signed)
 [facility] Inpatient Psychiatry Progress Note  Date: 09/23/24 Patient: [name] MRN: [id number]  Assessment and Plan: [name] is a 54 y.o. female with a past psychiatric schizoaffective bipolar type, transported to the [facility] emergency department for disorganized thoughts, paranoia and refusal to take medications. She was petitioned for IVC by her ACT Team. She was transferred to the [facility] for symptomatic stabilization and medication management.  1/26: CBC shows no signs of clozapine -induced neutropenia. ROS is stable. Increasing clozapine  from 150 mg to 200 mg at bedtime tonight given patient's ongoing manic symptoms. Will also reach out to [facility] Rudy [phone] tomorrow to give update and discuss course, with patients expressed verbal permission.    # Schizoaffective disorder, bipolar type (HCC) -- Continue Clozaril  increased 150 mg at bedtime for schizoaffective disorder -- Metabolic profile and EKG monitoring obtained while on an atypical antipsychotic  BMI: 35.55  Lipid Panel: LDL 109  HbgA1c: 0.6  QTc: 459 -- Clozapine  labs ordered include: Initial CBC WNL , troponins Neg, proBNP negative - CK 107 and CK-MB 1.8  - CRP patient refused labs  - TSH WNL - RPR Neg - CBC 1/27: Eosinophils 0.3 --> 0.5. Absolute neutrophils 1.8 --> 2.6. --- Consider metformin  500 mg daily for antipsychotic induced metabolic syndrome, bowel regularity   #Tachycardia -Continue to monitor, consider propranolol  Risk Assessment - Mild: Patient with a prior history of suicidal ideations and suicide attempt, patient not currently endorsing any suicidal thoughts, however does not have great executive function given current psychiatric decompensation.  Patient also noncompliant on medications which puts her at an increased risk of self-harm   Discharge Planning Barriers to discharge: Medication compliance, Safety planning, contact  with ACT Team  Estimated length of stay: 7- 13 days  Predicted Discharge location: Home   Interval History and update:  HR 101. BP 116/86. No medication refusals. No PRNs. Slept 10.75 hours. Denied SI, HI and AVH.    On interview: Conducted in room. [name] flight of ideas and disorganized speech/thinking remains very present (long tangential discussion about bisexuality). Emphatically endorses normal stooling. Patient said that she would prefer discharge early next week but would understand if it was later in the week. She denied SI, HI and AVH. Informed patient plan to increase clozapine  given degree of psychosis and encouraging AM CBC. Still unsure what the medications are for, but will take them. After extended discussion, patient did give explicit verbal permission to call Rudy (LPN at [facility]) back regarding her care.   Review of symptoms, specific for clozapine : Chest pain: denies Shortness of breath: denies  Exertional capacity: denies Tachycardia: None in morning vitals, denies  Cough: denies Fever: Normal temperature, denies Sedation: denies Orthostatic hypotension: denied Hypersalivation: denied Constipation: Denied Symptoms of GERD: denied Nausea: denied Nocturnal enuresis: endorsed some incontinence before she can reach the bathroom (assisted by walker.    [name], Daughter, [phone] attempted x1 09/22/2024 11:55 pm: Did not respond.  Rea Acton: IVC Petitioner, ACT Team Member, [phone])09/17/2024  Pt had been refusing medications the last 2 months. She was experiencing flight of ideas, exhibited disorganized behavior and was very tangential. In the past, she has accepted the injection and symptoms and here symptoms better improved. She would start to make more sense and experience some fixed delusions.   Silvano Salisbury ACT Team Nurse, [phone],   09/18/2024   Attempts x2 at 1:37 PM   09/17/2024  Attempted to call and left a voicemail  2:37  PM   Physical Exam MSK/Neuro -patient is alert to person, self, place no focal deficits noted on observation Mental Status Exam Appearance - Casually dressed, disheveled, malodorous, sitting in day room Attitude - Calm, polite, not guarded Speech -normal prosody, inflection Mood -fine Affect -congruent Thought Process -disorganized, tangential, flight of ideas Thought Content - suspect some fixed delusions,  TC expressed  SI/HI - Denies  Perceptions - Denies AVH; not RIS Judgement/Insight - Impaired  Fund of knowledge - Lacking  Language - No impairments  Lab Results:  Admission on 09/12/2024  Component Date Value Ref Range Status   Total CK 09/14/2024 107  38 - 234 U/L Final   CK, MB 09/14/2024 1.8  0.5 - 5.0 ng/mL Final   Pro Brain Natriuretic Peptide 09/14/2024 <50.0  <300.0 pg/mL Final   Hgb A1c MFr Bld 09/14/2024 5.6  4.8 - 5.6 % Final   Mean Plasma Glucose 09/14/2024 114.02  mg/dL Final   TSH 98/81/7973 0.713  0.350 - 4.500 uIU/mL Final   Cholesterol 09/14/2024 170  0 - 200 mg/dL Final   Triglycerides 98/81/7973 67  <150 mg/dL Final   HDL 98/81/7973 47  >40 mg/dL Final   Total CHOL/HDL Ratio 09/14/2024 3.6  RATIO Final   VLDL 09/14/2024 13  0 - 40 mg/dL Final   LDL Cholesterol 09/14/2024 109 (H)  0 - 99 mg/dL Final   RPR Ser Ql 98/81/7973 NON REACTIVE  NON REACTIVE Final   WBC 09/14/2024 4.8  4.0 - 10.5 K/uL Final   RBC 09/14/2024 4.16  3.87 - 5.11 MIL/uL Final   Hemoglobin 09/14/2024 12.5  12.0 - 15.0 g/dL Final   HCT 98/81/7973 37.6  36.0 - 46.0 % Final   MCV 09/14/2024 90.4  80.0 - 100.0 fL Final   MCH 09/14/2024 30.0  26.0 - 34.0 pg Final   MCHC 09/14/2024 33.2  30.0 - 36.0 g/dL Final   RDW 98/81/7973 12.9  11.5 - 15.5 % Final   Platelets 09/14/2024 255  150 - 400 K/uL Final   nRBC 09/14/2024 0.0  0.0 - 0.2 % Final   Neutrophils Relative % 09/14/2024 38  % Final   Neutro Abs 09/14/2024 1.8  1.7 - 7.7 K/uL Final   Lymphocytes Relative 09/14/2024 47  %  Final   Lymphs Abs 09/14/2024 2.3  0.7 - 4.0 K/uL Final   Monocytes Relative 09/14/2024 7  % Final   Monocytes Absolute 09/14/2024 0.3  0.1 - 1.0 K/uL Final   Eosinophils Relative 09/14/2024 7  % Final   Eosinophils Absolute 09/14/2024 0.3  0.0 - 0.5 K/uL Final   Basophils Relative 09/14/2024 1  % Final   Basophils Absolute 09/14/2024 0.0  0.0 - 0.1 K/uL Final   Immature Granulocytes 09/14/2024 0  % Final   Abs Immature Granulocytes 09/14/2024 0.01  0.00 - 0.07 K/uL Final   Troponin T High Sensitivity 09/14/2024 <15  0 - 19 ng/L Final   WBC 09/22/2024 5.4  4.0 - 10.5 K/uL Final   RBC 09/22/2024 3.89  3.87 - 5.11 MIL/uL Final   Hemoglobin 09/22/2024 11.7 (L)  12.0 - 15.0 g/dL Final   HCT 98/73/7973 36.0  36.0 - 46.0 % Final   MCV 09/22/2024 92.5  80.0 - 100.0 fL Final   MCH 09/22/2024 30.1  26.0 - 34.0 pg Final   MCHC 09/22/2024 32.5  30.0 - 36.0 g/dL Final   RDW 98/73/7973 13.2  11.5 - 15.5 % Final   Platelets 09/22/2024 207  150 - 400 K/uL  Final   nRBC 09/22/2024 0.0  0.0 - 0.2 % Final   Neutrophils Relative % 09/22/2024 56  % Final   Neutro Abs 09/22/2024 3.1  1.7 - 7.7 K/uL Final   Lymphocytes Relative 09/22/2024 32  % Final   Lymphs Abs 09/22/2024 1.7  0.7 - 4.0 K/uL Final   Monocytes Relative 09/22/2024 6  % Final   Monocytes Absolute 09/22/2024 0.3  0.1 - 1.0 K/uL Final   Eosinophils Relative 09/22/2024 5  % Final   Eosinophils Absolute 09/22/2024 0.3  0.0 - 0.5 K/uL Final   Basophils Relative 09/22/2024 0  % Final   Basophils Absolute 09/22/2024 0.0  0.0 - 0.1 K/uL Final   Immature Granulocytes 09/22/2024 1  % Final   Abs Immature Granulocytes 09/22/2024 0.03  0.00 - 0.07 K/uL Final   WBC 09/23/2024 6.0  4.0 - 10.5 K/uL Final   RBC 09/23/2024 4.24  3.87 - 5.11 MIL/uL Final   Hemoglobin 09/23/2024 12.4  12.0 - 15.0 g/dL Final   HCT 98/72/7973 38.9  36.0 - 46.0 % Final   MCV 09/23/2024 91.7  80.0 - 100.0 fL Final   MCH 09/23/2024 29.2  26.0 - 34.0 pg Final   MCHC  09/23/2024 31.9  30.0 - 36.0 g/dL Final   RDW 98/72/7973 13.2  11.5 - 15.5 % Final   Platelets 09/23/2024 224  150 - 400 K/uL Final   nRBC 09/23/2024 0.0  0.0 - 0.2 % Final   Neutrophils Relative % 09/23/2024 44  % Final   Neutro Abs 09/23/2024 2.6  1.7 - 7.7 K/uL Final   Lymphocytes Relative 09/23/2024 41  % Final   Lymphs Abs 09/23/2024 2.5  0.7 - 4.0 K/uL Final   Monocytes Relative 09/23/2024 6  % Final   Monocytes Absolute 09/23/2024 0.4  0.1 - 1.0 K/uL Final   Eosinophils Relative 09/23/2024 8  % Final   Eosinophils Absolute 09/23/2024 0.5  0.0 - 0.5 K/uL Final   Basophils Relative 09/23/2024 1  % Final   Basophils Absolute 09/23/2024 0.0  0.0 - 0.1 K/uL Final   Immature Granulocytes 09/23/2024 0  % Final   Abs Immature Granulocytes 09/23/2024 0.02  0.00 - 0.07 K/uL Final    Vitals: Blood pressure 113/88, pulse (!) 105, temperature 97.6 F (36.4 C), temperature source Oral, resp. rate 16, height 5' 7 (1.702 m), weight 103 kg, SpO2 100%.    Signed: Odis Cleveland, MD [facility] Physician, PGY-2 09/23/2024 6:09 PM

## 2024-09-23 NOTE — BHH Group Notes (Signed)
 Adult Psychoeducational Group Note  Date:  09/23/2024 Time:  9:22 AM  Group Topic/Focus:  Goals Group:   The focus of this group is to help patients establish daily goals to achieve during treatment and discuss how the patient can incorporate goal setting into their daily lives to aide in recovery.  Participation Level:  Did Not Attend  Participation Quality:    Affect:    Cognitive:    Insight:   Engagement in Group:    Modes of Intervention:    Additional Comments:    [name] 09/23/2024, 9:22 AM

## 2024-09-23 NOTE — Plan of Care (Signed)
  Problem: Education: Goal: Emotional status will improve Outcome: Progressing   Problem: Activity: Goal: Interest or engagement in activities will improve Outcome: Progressing Goal: Sleeping patterns will improve Outcome: Progressing   Problem: Safety: Goal: Periods of time without injury will increase Outcome: Progressing   Problem: Education: Goal: Mental status will improve Outcome: Not Progressing

## 2024-09-23 NOTE — BHH Group Notes (Signed)
 Adult Psychoeducational Group Note  Date:  09/23/2024 Time:  11:55 AM  Group Topic/Focus:  Diagnosis Education:   The focus of this group is to discuss the major disorders that patients maybe diagnosed with.  Group discusses the importance of knowing what one's diagnosis is so that one can understand treatment and better advocate for oneself.  Participation Level:  Did Not Attend  Participation Quality:   Affect:    Cognitive:    Insight:   Engagement in Group:    Modes of Intervention:    Additional Comments:    [name] 09/23/2024, 11:55 AM

## 2024-09-23 NOTE — Group Note (Signed)
 Date:  09/23/2024 Time:  8:33 PM  Group Topic/Focus:  Wrap-Up Group:   The focus of this group is to help patients review their daily goal of treatment and discuss progress on daily workbooks.    Participation Level:  Did Not Attend  Participation Quality:  N/A  Affect:  N/A  Cognitive:  N/A  Insight: None  Engagement in Group:  N/A  Modes of Intervention:  N/A  Additional Comments:  Patient was encouraged but did not attend  Eward Mace 09/23/2024, 8:33 PM

## 2024-09-23 NOTE — BHH Group Notes (Signed)
 Patient did not attend the Recreation Therapy group.

## 2024-09-24 ENCOUNTER — Encounter (HOSPITAL_COMMUNITY): Payer: Self-pay

## 2024-09-24 DIAGNOSIS — F250 Schizoaffective disorder, bipolar type: Secondary | ICD-10-CM | POA: Diagnosis not present

## 2024-09-24 MED ORDER — FLUPHENAZINE HCL 5 MG PO TABS
5.0000 mg | ORAL_TABLET | Freq: Two times a day (BID) | ORAL | Status: DC
  Administered 2024-09-24 – 2024-09-28 (×8): 5 mg via ORAL
  Filled 2024-09-24 (×8): qty 1

## 2024-09-24 NOTE — BHH Group Notes (Signed)
 Adult Psychoeducational Group Note  Date:  09/24/2024 Time:  4:49 PM  Group Topic/Focus: Emotional Wellness  Participation Level:  Did Not Attend  Participation Quality:    Affect:    Cognitive:    Insight:   Engagement in Group:    Modes of Intervention:    Additional Comments:    Tarrence Enck O 09/24/2024, 4:49 PM

## 2024-09-24 NOTE — Progress Notes (Signed)
 [facility] Inpatient Psychiatry Progress Note  Date: 09/24/24 Patient: [name] MRN: [id number]  Assessment and Plan: [name] is a 54 y.o. female with a past psychiatric schizoaffective bipolar type, transported to the [facility] emergency department for disorganized thoughts, paranoia and refusal to take medications. She was petitioned for IVC by her ACT Team. She was transferred to the [facility] for symptomatic stabilization and medication management.  1/27: Patient looks nearly identical to presentation on arrival. Able to hold a conversation together earlier, but then becomes largely unintelligible, rapid rate, flight of ideas. It is obvious that patient is making a great effort to be polite but also a strong component of paranoia: I know they're manipulating you (the resident doctor). I've read all the notes.  Effusively declined lithium . Said prolixin  works well for her although she does not see the need for it. Will add Prolixin  5 mg BID for refractory psychosis/manic symptoms. Reached out to ACT team ([facility]arch) to update them. Would like call again Monday.    # Schizoaffective disorder, bipolar type (HCC) -- Continue Clozaril  200 mg at bedtime for schizoaffective disorder -- Start Prolixin  5 mg BID for refractory psychosis.  -- Metabolic profile and EKG monitoring obtained while on an atypical antipsychotic  BMI: 35.55  Lipid Panel: LDL 109  HbgA1c: 0.6  QTc: 459 -- Clozapine  labs ordered include: Initial CBC WNL , troponins Neg, proBNP negative - CK 107 and CK-MB 1.8  - CRP patient refused labs  - TSH WNL - RPR Neg - CBC 1/27: Eosinophils 0.3 --> 0.5. Absolute neutrophils 1.8 --> 2.6. --- Consider metformin  500 mg daily for antipsychotic induced metabolic syndrome, bowel regularity   #Tachycardia -Continue to monitor, consider propranolol  Risk Assessment - Mild: Patient with a prior history of suicidal ideations and  suicide attempt, patient not currently endorsing any suicidal thoughts, however does not have great executive function given current psychiatric decompensation.  Patient also noncompliant on medications which puts her at an increased risk of self-harm   Discharge Planning Barriers to discharge: Medication compliance, Safety planning, contact with ACT Team  Estimated length of stay: 7- 13 days  Predicted Discharge location: Home   Interval History and update:  HR 100. 110/88. No medication refusals. No PRNs. Slept 6.75 hours. Continued disorganization with tangential speech. AH reported last night.    On interview: Patient says that she is very tired. Denies Clozapine  ROS. Discussed lithium  as an option for adjunct therapy, declines this effusively, saying that she is already resistant to it, and that she is not mentally ill. Exhibits flight of ideas towards latter half of interview, uninterruptable, only occaisonally intelligible. Denies SI, HI and AVH. Patient is somewhat self-aware that she is having a difficult time explaining things, and throughout attempts to be polite to this interviewer. Says that she does well with prolixin , but, again, does not understand the indication for its use.   Review of symptoms, specific for clozapine : Chest pain: denies Shortness of breath: denies  Exertional capacity: denies Tachycardia: None in morning vitals, denies  Cough: denies Fever: Normal temperature, denies Sedation: denies Orthostatic hypotension: denied Hypersalivation: denied Constipation: Denied Symptoms of GERD: denied Nausea: denied Nocturnal enuresis: endorsed some incontinence before she can reach the bathroom (assisted by walker.)  [name], Daughter, [phone] attempted x1 09/22/2024 11:55 pm: Did not respond.  [name]: IVC Petitioner, ACT Team Member, [phone]) 09/17/2024  Pt had been refusing medications the last 2 months. She was experiencing flight of  ideas, exhibited disorganized behavior and was very tangential. In the past, she has accepted the injection and symptoms and here symptoms better improved. She would start to make more sense and experience some fixed delusions.   [name] ACT team nurse with [name], with explicit verbal permission from patient 1/27 [phone]): Updated ACT team regarding current medication regimen. Pattern of behavior, takes medications in the hospital then stops. Would appreciate call from physicians for update Monday.   Physical Exam MSK/Neuro -patient is alert to person, self, place no focal deficits noted on observation Mental Status Exam Appearance - Casually dressed, disheveled, malodorous, layin gin bed Attitude - Calm, polite, not guarded Speech - rapid rate Mood -Tired Affect -congruent, more depressed-appearing this time around  Thought Process -disorganized, tangential, flight of ideas Thought Content - suspect some fixed delusions,  TC expressed  SI/HI - Denies  Perceptions - Denies AVH; not RIS Judgement/Insight - Impaired  Fund of knowledge - Lacking  Language - No impairments  Lab Results:  Admission on 09/12/2024  Component Date Value Ref Range Status   Total CK 09/14/2024 107  38 - 234 U/L Final   CK, MB 09/14/2024 1.8  0.5 - 5.0 ng/mL Final   Pro Brain Natriuretic Peptide 09/14/2024 <50.0  <300.0 pg/mL Final   Hgb A1c MFr Bld 09/14/2024 5.6  4.8 - 5.6 % Final   Mean Plasma Glucose 09/14/2024 114.02  mg/dL Final   TSH 98/81/7973 0.713  0.350 - 4.500 uIU/mL Final   Cholesterol 09/14/2024 170  0 - 200 mg/dL Final   Triglycerides 98/81/7973 67  <150 mg/dL Final   HDL 98/81/7973 47  >40 mg/dL Final   Total CHOL/HDL Ratio 09/14/2024 3.6  RATIO Final   VLDL 09/14/2024 13  0 - 40 mg/dL Final   LDL Cholesterol 09/14/2024 109 (H)  0 - 99 mg/dL Final   RPR Ser Ql 98/81/7973 NON REACTIVE  NON REACTIVE Final   WBC 09/14/2024 4.8  4.0 - 10.5 K/uL Final   RBC 09/14/2024  4.16  3.87 - 5.11 MIL/uL Final   Hemoglobin 09/14/2024 12.5  12.0 - 15.0 g/dL Final   HCT 98/81/7973 37.6  36.0 - 46.0 % Final   MCV 09/14/2024 90.4  80.0 - 100.0 fL Final   MCH 09/14/2024 30.0  26.0 - 34.0 pg Final   MCHC 09/14/2024 33.2  30.0 - 36.0 g/dL Final   RDW 98/81/7973 12.9  11.5 - 15.5 % Final   Platelets 09/14/2024 255  150 - 400 K/uL Final   nRBC 09/14/2024 0.0  0.0 - 0.2 % Final   Neutrophils Relative % 09/14/2024 38  % Final   Neutro Abs 09/14/2024 1.8  1.7 - 7.7 K/uL Final   Lymphocytes Relative 09/14/2024 47  % Final   Lymphs Abs 09/14/2024 2.3  0.7 - 4.0 K/uL Final   Monocytes Relative 09/14/2024 7  % Final   Monocytes Absolute 09/14/2024 0.3  0.1 - 1.0 K/uL Final   Eosinophils Relative 09/14/2024 7  % Final   Eosinophils Absolute 09/14/2024 0.3  0.0 - 0.5 K/uL Final   Basophils Relative 09/14/2024 1  % Final   Basophils Absolute 09/14/2024 0.0  0.0 - 0.1 K/uL Final   Immature Granulocytes 09/14/2024 0  % Final   Abs Immature Granulocytes 09/14/2024 0.01  0.00 - 0.07 K/uL Final   Troponin T High Sensitivity 09/14/2024 <15  0 - 19 ng/L Final   WBC 09/22/2024 5.4  4.0 - 10.5 K/uL Final   RBC 09/22/2024 3.89  3.87 - 5.11 MIL/uL Final   Hemoglobin 09/22/2024 11.7 (L)  12.0 - 15.0 g/dL Final   HCT 98/73/7973 36.0  36.0 - 46.0 % Final   MCV 09/22/2024 92.5  80.0 - 100.0 fL Final   MCH 09/22/2024 30.1  26.0 - 34.0 pg Final   MCHC 09/22/2024 32.5  30.0 - 36.0 g/dL Final   RDW 98/73/7973 13.2  11.5 - 15.5 % Final   Platelets 09/22/2024 207  150 - 400 K/uL Final   nRBC 09/22/2024 0.0  0.0 - 0.2 % Final   Neutrophils Relative % 09/22/2024 56  % Final   Neutro Abs 09/22/2024 3.1  1.7 - 7.7 K/uL Final   Lymphocytes Relative 09/22/2024 32  % Final   Lymphs Abs 09/22/2024 1.7  0.7 - 4.0 K/uL Final   Monocytes Relative 09/22/2024 6  % Final   Monocytes Absolute 09/22/2024 0.3  0.1 - 1.0 K/uL Final   Eosinophils Relative 09/22/2024 5  % Final   Eosinophils Absolute 09/22/2024  0.3  0.0 - 0.5 K/uL Final   Basophils Relative 09/22/2024 0  % Final   Basophils Absolute 09/22/2024 0.0  0.0 - 0.1 K/uL Final   Immature Granulocytes 09/22/2024 1  % Final   Abs Immature Granulocytes 09/22/2024 0.03  0.00 - 0.07 K/uL Final   WBC 09/23/2024 6.0  4.0 - 10.5 K/uL Final   RBC 09/23/2024 4.24  3.87 - 5.11 MIL/uL Final   Hemoglobin 09/23/2024 12.4  12.0 - 15.0 g/dL Final   HCT 98/72/7973 38.9  36.0 - 46.0 % Final   MCV 09/23/2024 91.7  80.0 - 100.0 fL Final   MCH 09/23/2024 29.2  26.0 - 34.0 pg Final   MCHC 09/23/2024 31.9  30.0 - 36.0 g/dL Final   RDW 98/72/7973 13.2  11.5 - 15.5 % Final   Platelets 09/23/2024 224  150 - 400 K/uL Final   nRBC 09/23/2024 0.0  0.0 - 0.2 % Final   Neutrophils Relative % 09/23/2024 44  % Final   Neutro Abs 09/23/2024 2.6  1.7 - 7.7 K/uL Final   Lymphocytes Relative 09/23/2024 41  % Final   Lymphs Abs 09/23/2024 2.5  0.7 - 4.0 K/uL Final   Monocytes Relative 09/23/2024 6  % Final   Monocytes Absolute 09/23/2024 0.4  0.1 - 1.0 K/uL Final   Eosinophils Relative 09/23/2024 8  % Final   Eosinophils Absolute 09/23/2024 0.5  0.0 - 0.5 K/uL Final   Basophils Relative 09/23/2024 1  % Final   Basophils Absolute 09/23/2024 0.0  0.0 - 0.1 K/uL Final   Immature Granulocytes 09/23/2024 0  % Final   Abs Immature Granulocytes 09/23/2024 0.02  0.00 - 0.07 K/uL Final    Vitals: Blood pressure 110/88, pulse 100, temperature (!) 97.2 F (36.2 C), temperature source Oral, resp. rate 16, height 5' 7 (1.702 m), weight 103 kg, SpO2 100%.    Signed: Odis Cleveland, MD [facility] Physician, PGY-2 09/24/2024 11:32 AM

## 2024-09-24 NOTE — BHH Group Notes (Signed)
 BHH Group Notes:  (Nursing/MHT/Case Management/Adjunct)  Date:  09/24/2024  Time:  8:49 PM  Type of Therapy:  Psychoeducational Skills  Participation Level:  Active  Participation Quality:  Monopolizing  Affect:  Excited  Cognitive:  Disorganized  Insight:  Lacking  Engagement in Group:  Monopolizing  Modes of Intervention:  Education  Summary of Progress/Problems: Patient rated her day as a 5 out of a possible 10 and states that she enjoyed listening to music from the tv. Her goal for today was to get in touch with her family which she accomplished. She went off on a tangent about not understanding why she was admitted to the hospital. She also talked about having multiple issues with her family.   [name] S 09/24/2024, 8:49 PM

## 2024-09-24 NOTE — Plan of Care (Signed)
  Problem: Education: Goal: Emotional status will improve Outcome: Progressing Goal: Verbalization of understanding the information provided will improve Outcome: Progressing   Problem: Activity: Goal: Sleeping patterns will improve Outcome: Progressing   Problem: Coping: Goal: Ability to demonstrate self-control will improve Outcome: Progressing

## 2024-09-24 NOTE — BHH Group Notes (Signed)
 Adult Psychoeducational Group Note  Date:  09/24/2024 Time:  10:08 AM  Group Topic/Focus:  Goals Group:   The focus of this group is to help patients establish daily goals to achieve during treatment and discuss how the patient can incorporate goal setting into their daily lives to aide in recovery. Orientation:   The focus of this group is to educate the patient on the purpose and policies of crisis stabilization and provide a format to answer questions about their admission.  The group details unit policies and expectations of patients while admitted.  Participation Level:  Did Not Attend  Participation Quality:    Affect:    Cognitive:    Insight:   Engagement in Group:    Modes of Intervention:    Additional Comments:    Aliani Caccavale O 09/24/2024, 10:08 AM

## 2024-09-24 NOTE — Plan of Care (Signed)
  Problem: Education: Goal: Emotional status will improve Outcome: Progressing Goal: Mental status will improve Outcome: Progressing   Problem: Activity: Goal: Interest or engagement in activities will improve Outcome: Progressing Goal: Sleeping patterns will improve Outcome: Progressing   Problem: Safety: Goal: Periods of time without injury will increase Outcome: Progressing   Problem: Activity: Goal: Interest or engagement in activities will improve Outcome: Progressing Goal: Sleeping patterns will improve Outcome: Progressing

## 2024-09-24 NOTE — Progress Notes (Signed)
(  Sleep Hours) -8.75  (Any PRNs that were needed, meds refused, or side effects to meds)- none   (Any disturbances and when (visitation, over night)-none  (Concerns raised by the patient)- pt up in the dayroom some this evening interacting with peers and staff , pt appears to be less disorganized  (SI/HI/AVH)- denies

## 2024-09-24 NOTE — BH IP Treatment Plan (Signed)
 Interdisciplinary Treatment and Diagnostic Plan Update  09/24/2024 Time of Session: [name] MRN: [id number]  Principal Diagnosis: Schizoaffective disorder, bipolar type [facility])  Secondary Diagnoses: Principal Problem:   Schizoaffective disorder, bipolar type (HCC)   Current Medications:  Current Facility-Administered Medications  Medication Dose Route Frequency Provider Last Rate Last Admin   albuterol  (VENTOLIN  HFA) 108 (90 Base) MCG/ACT inhaler 1-2 puff  1-2 puff Inhalation Q6H PRN Lenard Calin, MD   2 puff at 09/18/24 2037   cloZAPine  (CLOZARIL ) tablet 200 mg  200 mg Oral QHS Crawford, Benjamin, MD   200 mg at 09/23/24 2042   fluPHENAZine  (PROLIXIN ) tablet 5 mg  5 mg Oral BID Rollene Katz, MD       ibuprofen  (ADVIL ) tablet 800 mg  800 mg Oral Q8H PRN Lenard Calin, MD       LORazepam  (ATIVAN ) tablet 1 mg  1 mg Oral Daily PRN Onuoha, Chinwendu V, NP       Or   LORazepam  (ATIVAN ) injection 1 mg  1 mg Intramuscular Daily PRN Onuoha, Chinwendu V, NP       OLANZapine  zydis (ZYPREXA ) disintegrating tablet 5 mg  5 mg Oral TID PRN Onuoha, Chinwendu V, NP   5 mg at 09/12/24 2050   polyethylene glycol (MIRALAX  / GLYCOLAX ) packet 17 g  17 g Oral Daily Lenard Calin, MD   17 g at 09/23/24 0804   senna (SENOKOT) tablet 8.6 mg  1 tablet Oral QHS PRN Lenard Calin, MD       PTA Medications: Medications Prior to Admission  Medication Sig Dispense Refill Last Dose/Taking   albuterol  (VENTOLIN  HFA) 108 (90 Base) MCG/ACT inhaler Inhale 1-2 puffs into the lungs every 6 (six) hours as needed for wheezing or shortness of breath.      ascorbic acid  (VITAMIN C) 500 MG tablet Take 1 tablet (500 mg total) by mouth 2 (two) times daily. (Patient not taking: Reported on 09/11/2024) 60 tablet 0    budesonide -formoterol  (SYMBICORT ) 160-4.5 MCG/ACT inhaler Inhale 2 puffs into the lungs daily. (Patient not taking: Reported on 09/11/2024) 1 each 12    CAPLYTA 42 MG capsule Take 42 mg by mouth  daily.      divalproex  (DEPAKOTE  ER) 500 MG 24 hr tablet Take 2 tablets (1,000 mg total) by mouth at bedtime. (Patient not taking: Reported on 09/11/2024) 60 tablet 0    fluticasone  (FLONASE ) 50 MCG/ACT nasal spray Place 1 spray into both nostrils daily. (Patient not taking: Reported on 09/11/2024) 18.2 mL 2    fluticasone  (FLONASE ) 50 MCG/ACT nasal spray Place 1 spray into both nostrils daily. (Patient not taking: Reported on 09/11/2024) 11.1 mL 0    ibuprofen  (ADVIL ) 800 MG tablet Take 1 tablet (800 mg total) by mouth every 8 (eight) hours as needed for moderate pain. (Patient not taking: Reported on 09/11/2024) 21 tablet 0    metFORMIN  (GLUCOPHAGE ) 500 MG tablet Take 1 tablet (500 mg total) by mouth daily with breakfast. (Patient not taking: Reported on 09/11/2024) 30 tablet 0    ondansetron  (ZOFRAN -ODT) 4 MG disintegrating tablet Take 1 tablet (4 mg total) by mouth every 8 (eight) hours as needed for nausea or vomiting. (Patient not taking: Reported on 09/11/2024) 20 tablet 0    paliperidone  (INVEGA  SUSTENNA) 234 MG/1.5ML injection Inject 234 mg into the muscle every 28 (twenty-eight) days. (Patient not taking: Reported on 09/11/2024) 1.5 mL 0    traZODone  (DESYREL ) 50 MG tablet Take 1 tablet (50 mg total) by mouth at bedtime as  needed for sleep. (Patient not taking: Reported on 09/11/2024) 30 tablet 0     Patient Stressors: Health problems   Medication change or noncompliance    Patient Strengths: Ability for insight  Communication skills   Treatment Modalities: Medication Management, Group therapy, Case management,  1 to 1 session with clinician, Psychoeducation, Recreational therapy.   Physician Treatment Plan for Primary Diagnosis: Schizoaffective disorder, bipolar type (HCC) Long Term Goal(s): Improvement in symptoms so as ready for discharge   Short Term Goals: Ability to identify changes in lifestyle to reduce recurrence of condition will improve Ability to identify and develop effective  coping behaviors will improve Compliance with prescribed medications will improve Ability to identify triggers associated with substance abuse/mental health issues will improve  Medication Management: Evaluate patient's response, side effects, and tolerance of medication regimen.  Therapeutic Interventions: 1 to 1 sessions, Unit Group sessions and Medication administration.  Evaluation of Outcomes: Progressing  Physician Treatment Plan for Secondary Diagnosis: Principal Problem:   Schizoaffective disorder, bipolar type (HCC)  Long Term Goal(s): Improvement in symptoms so as ready for discharge   Short Term Goals: Ability to identify changes in lifestyle to reduce recurrence of condition will improve Ability to identify and develop effective coping behaviors will improve Compliance with prescribed medications will improve Ability to identify triggers associated with substance abuse/mental health issues will improve     Medication Management: Evaluate patient's response, side effects, and tolerance of medication regimen.  Therapeutic Interventions: 1 to 1 sessions, Unit Group sessions and Medication administration.  Evaluation of Outcomes: Progressing   RN Treatment Plan for Primary Diagnosis: Schizoaffective disorder, bipolar type (HCC) Long Term Goal(s): Knowledge of disease and therapeutic regimen to maintain health will improve  Short Term Goals: Ability to verbalize frustration and anger appropriately will improve, Ability to participate in decision making will improve, Ability to verbalize feelings will improve, and Ability to identify and develop effective coping behaviors will improve  Medication Management: RN will administer medications as ordered by provider, will assess and evaluate patient's response and provide education to patient for prescribed medication. RN will report any adverse and/or side effects to prescribing provider.  Therapeutic Interventions: 1 on 1  counseling sessions, Psychoeducation, Medication administration, Evaluate responses to treatment, Monitor vital signs and CBGs as ordered, Perform/monitor CIWA, COWS, AIMS and Fall Risk screenings as ordered, Perform wound care treatments as ordered.  Evaluation of Outcomes: Progressing   LCSW Treatment Plan for Primary Diagnosis: Schizoaffective disorder, bipolar type (HCC) Long Term Goal(s): Safe transition to appropriate next level of care at discharge, Engage patient in therapeutic group addressing interpersonal concerns.  Short Term Goals: Engage patient in aftercare planning with referrals and resources, Increase social support, Increase ability to appropriately verbalize feelings, and Increase skills for wellness and recovery  Therapeutic Interventions: Assess for all discharge needs, 1 to 1 time with Social worker, Explore available resources and support systems, Assess for adequacy in community support network, Educate family and significant other(s) on suicide prevention, Complete Psychosocial Assessment, Interpersonal group therapy.  Evaluation of Outcomes: Progressing   Progress in Treatment: Attending groups: No. Participating in groups: No. Taking medication as prescribed: Yes. Toleration medication: Yes. Family/Significant other contact made:  Yes, contacted:  [name] (daughter) ph: [phone]. Patient understands diagnosis: Yes. Discussing patient identified problems/goals with staff: Yes. Medical problems stabilized or resolved: Yes. Denies suicidal/homicidal ideation: Yes. Issues/concerns per patient self-inventory: No. None reported.   New problem(s) identified: No, Describe:  None identified.   New Short Term/Long Term Goal(s):  medication stabilization, elimination of SI thoughts, development of comprehensive mental wellness plan.     Patient Goals:  UTA, patient presented as disorganized with tangential speech.    Discharge Plan or Barriers: Patient  recently admitted. CSW will continue to follow and assess for appropriate referrals and possible discharge planning.     Reason for Continuation of Hospitalization: Delusions  Mania Medication stabilization Other; describe : disorganized    Estimated Length of Stay:  5-6 days.  Last 3 [place] Suicide Severity Risk Score: Flowsheet Row [facility] (Current) [facility] from [facility]11[facility]20[facility] Pre-admit (Canceled) [facility]  C-SSRS RISK CATEGORY No Risk No Risk No Risk    Last PHQ 2/9 Scores:    02/23/2021    8:30 AM 09/16/2020    1:00 PM 10/24/2016    3:24 PM  Depression screen PHQ 2/9  Decreased Interest 1 0 1  Down, Depressed, Hopeless 1 0 1  PHQ - 2 Score 2 0 2  Altered sleeping 2 1 3   Tired, decreased energy 0 0 1  Change in appetite 0 0 0  Feeling bad or failure about yourself  1 0 1  Trouble concentrating 0 0 0  Moving slowly or fidgety/restless 0 0 3  Suicidal thoughts 1 0 1   PHQ-9 Score 6  1  11       Data saved with a previous flowsheet row definition    Scribe for Treatment Team: Jenkins LULLA Morley ISRAEL 09/24/2024 2:55 PM

## 2024-09-24 NOTE — Group Note (Signed)
 Date:  09/24/2024 Time:  5:23 PM  Group Topic/Focus:  Dimensions of Wellness:   The focus of this group is to introduce the topic of wellness and discuss the role each dimension of wellness plays in total health.    Participation Level:  Did Not Attend    Annalee Larch 09/24/2024, 5:23 PM

## 2024-09-24 NOTE — Progress Notes (Signed)
" °   09/24/24 1100  Psych Admission Type (Psych Patients Only)  Admission Status Involuntary  Psychosocial Assessment  Patient Complaints Anxiety  Eye Contact Brief  Facial Expression Anxious;Animated  Affect Preoccupied  Speech Pressured  Interaction Assertive  Motor Activity Slow  Appearance/Hygiene Improved  Behavior Characteristics Cooperative  Mood Anxious;Preoccupied  Thought Process  Coherency Circumstantial;Disorganized  Content Preoccupation  Delusions None reported or observed  Perception Hallucinations  Hallucination Auditory  Judgment Impaired  Confusion Mild  Danger to Self  Current suicidal ideation? Denies  Danger to Others  Danger to Others None reported or observed    "

## 2024-09-24 NOTE — [facility] Group Notes (Signed)
 Adult Psychoeducational Group Note  Date:  09/24/2024 Time:  11:53 AM  Group Topic/Focus: Recreation Therapy  Participation Level:  Did Not Attend  Participation Quality:    Affect:    Cognitive:    Insight:   Engagement in Group:    Modes of Intervention:    Additional Comments:    Jatoya Armbrister O 09/24/2024, 11:53 AM

## 2024-09-24 NOTE — Group Note (Signed)
 Recreation Therapy Group Note   Group Topic:Problem Solving  Group Date: 09/24/2024 Start Time: 1011 End Time: 1035 Facilitators: Kalli Greenfield-McCall, LRT,CTRS Location: [address] Dayroom   Group Topic: Communication, Team Building, Problem Solving  Goal Area(s) Addresses:  Patient will effectively work with peer towards shared goal.  Patient will identify skills used to make activity successful.  Patient will identify how skills used during activity can be used to reach post d/c goals.   Behavioral Response:   Intervention: STEM Activity  Activity: Landing Pad. In teams of 3-5, patients were given 12 plastic drinking straws and an equal length of masking tape. Using the materials provided, patients were asked to build a landing pad to catch a golf ball dropped from approximately 5 feet in the air. All materials were required to be used by the team in their design. LRT facilitated post-activity discussion.  Education: Pharmacist, Community, Scientist, Physiological, Discharge Planning   Education Outcome: Acknowledges education/In group clarification offered/Needs additional education.    Affect/Mood: N/A   Participation Level: Did not attend    Clinical Observations/Individualized Feedback:      Plan: Continue to engage patient in RT group sessions 2-3x/week.   Jahzier Villalon-McCall, LRT,CTRS 09/24/2024 1:44 PM

## 2024-09-24 NOTE — [facility] Group Notes (Signed)
 Adult Psychoeducational Group Note  Date:  09/24/2024 Time:  4:48 PM  Group Topic/Focus: Physical Wellness  Participation Level:  Did Not Attend  Participation Quality:    Affect:    Cognitive:    Insight:   Engagement in Group:    Modes of Intervention:    Additional Comments:    Amorina Doerr O 09/24/2024, 4:48 PM

## 2024-09-25 MED ORDER — CLOZAPINE 100 MG PO TABS
200.0000 mg | ORAL_TABLET | Freq: Every day | ORAL | Status: DC
  Administered 2024-09-25: 200 mg via ORAL
  Filled 2024-09-25: qty 2

## 2024-09-25 MED ORDER — CLOZAPINE 25 MG PO TABS: 250.0000 mg | ORAL_TABLET | Freq: Every day | ORAL | Status: DC

## 2024-09-25 NOTE — [facility] Group Notes (Signed)
 Adult Psychoeducational Group Note  Date:  09/25/2024 Time:  9:37 AM  Group Topic/Focus:  Goals Group:   The focus of this group is to help patients establish daily goals to achieve during treatment and discuss how the patient can incorporate goal setting into their daily lives to aide in recovery.  Participation Level:  Did Not Attend  Participation Quality:    Affect:    Cognitive:    Insight:   Engagement in Group:    Modes of Intervention:    Additional Comments:    [name] 09/25/2024, 9:37 AM

## 2024-09-25 NOTE — Plan of Care (Signed)
  Problem: Education: Goal: Emotional status will improve Outcome: Progressing Goal: Verbalization of understanding the information provided will improve Outcome: Progressing   Problem: Coping: Goal: Ability to verbalize frustrations and anger appropriately will improve Outcome: Progressing Goal: Ability to demonstrate self-control will improve Outcome: Progressing   

## 2024-09-25 NOTE — Progress Notes (Signed)
(  Sleep Hours) -16.5  (Any PRNs that were needed, meds refused, or side effects to meds)- none  (Any disturbances and when (visitation, over night)-none  (Concerns raised by the patient)- none  (SI/HI/AVH)-AH

## 2024-09-25 NOTE — Progress Notes (Signed)
" °   09/25/24 1000  Psych Admission Type (Psych Patients Only)  Admission Status Involuntary  Psychosocial Assessment  Patient Complaints Anxiety  Eye Contact Fair  Facial Expression Anxious  Affect Preoccupied  Speech Pressured  Interaction Assertive  Motor Activity Slow  Appearance/Hygiene Disheveled  Behavior Characteristics Cooperative  Mood Anxious;Preoccupied  Thought Process  Coherency Circumstantial;Disorganized  Content Preoccupation  Delusions WDL  Perception Hallucinations  Hallucination Auditory  Judgment Impaired  Confusion Mild  Danger to Self  Current suicidal ideation? Denies  Danger to Others  Danger to Others None reported or observed    "

## 2024-09-25 NOTE — Group Note (Signed)
 Date:  09/25/2024 Time:  8:59 PM  Group Topic/Focus:  Wrap-Up Group:   The focus of this group is to help patients review their daily goal of treatment and discuss progress on daily workbooks.    Participation Level:  Active  Participation Quality:  Appropriate  Affect:  Appropriate  Cognitive:  Appropriate  Insight: Appropriate  Engagement in Group:  Engaged  Modes of Intervention:  Education  Additional Comments:  Patient attended and participated in group tonight. She reports that today she took a nap, watched television, took her medication and spoke with her Doctor. She attended group and is waiting for her clothing to be washed.  [name] 09/25/2024, 8:59 PM

## 2024-09-25 NOTE — Care Management Important Message (Signed)
 Medicare IM printed and given to social work to give to the patient. ?

## 2024-09-25 NOTE — [facility] Group Notes (Signed)
 Adult Psychoeducational Group Note  Date:  09/25/2024 Time:  6:53 PM  Group Topic/Focus:  Self Care:   The focus of this group is to help patients understand the importance of self-care in order to improve or restore emotional, physical, spiritual, interpersonal, and financial health.  Participation Level:    Participation Quality:    Affect:    Cognitive:    Insight:   Engagement in Group:    Modes of Intervention:    Additional Comments:    [name] 09/25/2024, 6:53 PM

## 2024-09-25 NOTE — Group Note (Signed)
 Recreation Therapy Group Note   Group Topic:Self-Esteem  Group Date: 09/25/2024 Start Time: 1008 End Time: 1033 Facilitators: Thang Flett-McCall, LRT,CTRS Location: [address] Dayroom   Group Topic: Self Esteem    Goal Area(s) Addresses:  Patient will appropriately identify what self esteem is.  Patient will create a shield of armor describing themselves.  Patient will successfully identify positive attributes about themselves.    Behavioral Response: Minimal  Intervention / Activity: Self-Esteem Shield. Patient attended a recreation therapy group session focused on self esteem. Patient identified what self esteem is, and why it is important to have high self esteem during group discussion. Patient was provided sheets with the shield printed on them and colored pencils, markers and crayons to complete the activity. Patient was asked to create their own shield to show off their unique attributes, four quadrants reflected the following:   The Upper Left quadrant- 2 things/people you value The Upper Right quadrant- 2 lessons you have learned thus far in life The Lower Left quadrant- 3 qualities that make you unique The Lower Right quadrant- 1 goal you want to work towards    Education: Self esteem, Communication, Positive self-talk, Discharge Planning   Education Outcome: Acknowledges education/Verbalizes understanding of Education/In group clarification offered/Needs further education   Affect/Mood: Flat   Participation Level: Minimal   Participation Quality: Independent   Behavior: Appropriate   Speech/Thought Process: None   Insight: None   Judgement: None   Modes of Intervention: Art   Patient Response to Interventions:  Receptive   Education Outcome:  In group clarification offered    Clinical Observations/Individualized Feedback: Pt was just waking up and wasn't fully into group. Pt did complete the shield but it was hard/impossible to read. Pt was called out  of group but returned at the end.     Plan: Continue to engage patient in RT group sessions 2-3x/week.   Anarie Kalish-McCall, LRT,CTRS 09/25/2024 12:14 PM

## 2024-09-25 NOTE — Progress Notes (Signed)
 [facility] Inpatient Psychiatry Progress Note  Date: 09/25/24 Patient: [name] MRN: [id number]  Assessment and Plan: [name] is a 54 y.o. female with a past psychiatric schizoaffective bipolar type, transported to the [facility] emergency department for disorganized thoughts, paranoia and refusal to take medications. She was petitioned for IVC by her ACT Team. She was transferred to the [facility] for symptomatic stabilization and medication management.  1/29: Patient took prolixin  without incident last night. No new worrying symptomatology today, negative clozapine  ROS. Perhaps a little more active/alert than yesterday. No real change in disorganized/tangential speech however. Will continue on dual antipsychotic therapy and continue to monitor.    # Schizoaffective disorder, bipolar type (HCC) -- Continue Clozaril  200 mg at bedtime for schizoaffective disorder, increase to 250 mg on Saturday 1/31.  -- Continue Prolixin  5 mg BID for refractory psychosis.  -- Metabolic profile and EKG monitoring obtained while on an atypical antipsychotic  BMI: 35.55  Lipid Panel: LDL 109  HbgA1c: 0.6  QTc: 459 -- Clozapine  labs ordered include: Initial CBC WNL , troponins Neg, proBNP negative - CK 107 and CK-MB 1.8  - CRP patient refused labs  - TSH WNL - RPR Neg - CBC 1/27: Eosinophils 0.3 --> 0.5. Absolute neutrophils 1.8 --> 2.6. --- Consider metformin  500 mg daily for antipsychotic induced metabolic syndrome, bowel regularity  #Tachycardia, resolved -Continue to monitor, consider propranolol  Risk Assessment - Mild: Patient with a prior history of suicidal ideations and suicide attempt, patient not currently endorsing any suicidal thoughts, however does not have great executive function given current psychiatric decompensation.  Patient also noncompliant on medications which puts her at an increased risk of self-harm   Discharge  Planning Barriers to discharge: Medication compliance, Safety planning, contact with ACT Team  Estimated length of stay: 7- 13 days  Predicted Discharge location: Home   Interval History and update:  147/95. HR 87. Took prolixin  without incident last night. Slept 8.75. Patient less disorganized. Interacting with peers and staff. Denied SI, HI and AVH.    On interview: Patient remains very tangential and hard to follow although answers closed-ended questions appropriately. Patient speech remains largely disorganized. Denied clozapine  symptoms as below. Says she is a little tired but has been making up for lost sleep as she does not really sleep at home. Denied SI, HI and AVH.   Review of symptoms, specific for clozapine : Chest pain: denies Shortness of breath: denies  Exertional capacity: denies Tachycardia: None in morning vitals, denies  Cough: denies Fever: Normal temperature, denies Sedation: denies Orthostatic hypotension: denied Hypersalivation: denied Constipation: Denied Symptoms of GERD: denied Nausea: denied Nocturnal enuresis: endorsed some incontinence before she can reach the bathroom (assisted by walker.)  [name], Daughter, [phone] attempted x1 09/22/2024 11:55 pm: Did not respond.  Rea Acton: IVC Petitioner, ACT Team Member, [phone]) 09/17/2024  Pt had been refusing medications the last 2 months. She was experiencing flight of ideas, exhibited disorganized behavior and was very tangential. In the past, she has accepted the injection and symptoms and here symptoms better improved. She would start to make more sense and experience some fixed delusions.   1/27: [name] ACT team nurse with Merrily Mole LPN, with explicit verbal permission from patient [phone]): Updated ACT team regarding current medication regimen. Pattern of behavior, takes medications in the hospital then stops. Would appreciate call from physicians for update Monday.    Physical Exam MSK/Neuro -patient is alert to person, self, place no focal  deficits noted on observation Mental Status Exam Appearance - Casually dressed, disheveled, malodorous, sitting in chair Attitude - Calm, polite, not guarded Speech - rapid rate Mood -I'm up Affect -congruent, brighter Thought Process -disorganized, tangential, flight of ideas Thought Content - suspect some fixed delusions,  TC expressed  SI/HI - Denies  Perceptions - Denies AVH; not RTIS Judgement/Insight - Impaired  Fund of knowledge - Lacking  Language - No impairments  Lab Results:  Admission on 09/12/2024  Component Date Value Ref Range Status   Total CK 09/14/2024 107  38 - 234 U/L Final   CK, MB 09/14/2024 1.8  0.5 - 5.0 ng/mL Final   Pro Brain Natriuretic Peptide 09/14/2024 <50.0  <300.0 pg/mL Final   Hgb A1c MFr Bld 09/14/2024 5.6  4.8 - 5.6 % Final   Mean Plasma Glucose 09/14/2024 114.02  mg/dL Final   TSH 98/81/7973 0.713  0.350 - 4.500 uIU/mL Final   Cholesterol 09/14/2024 170  0 - 200 mg/dL Final   Triglycerides 98/81/7973 67  <150 mg/dL Final   HDL 98/81/7973 47  >40 mg/dL Final   Total CHOL/HDL Ratio 09/14/2024 3.6  RATIO Final   VLDL 09/14/2024 13  0 - 40 mg/dL Final   LDL Cholesterol 09/14/2024 109 (H)  0 - 99 mg/dL Final   RPR Ser Ql 98/81/7973 NON REACTIVE  NON REACTIVE Final   WBC 09/14/2024 4.8  4.0 - 10.5 K/uL Final   RBC 09/14/2024 4.16  3.87 - 5.11 MIL/uL Final   Hemoglobin 09/14/2024 12.5  12.0 - 15.0 g/dL Final   HCT 98/81/7973 37.6  36.0 - 46.0 % Final   MCV 09/14/2024 90.4  80.0 - 100.0 fL Final   MCH 09/14/2024 30.0  26.0 - 34.0 pg Final   MCHC 09/14/2024 33.2  30.0 - 36.0 g/dL Final   RDW 98/81/7973 12.9  11.5 - 15.5 % Final   Platelets 09/14/2024 255  150 - 400 K/uL Final   nRBC 09/14/2024 0.0  0.0 - 0.2 % Final   Neutrophils Relative % 09/14/2024 38  % Final   Neutro Abs 09/14/2024 1.8  1.7 - 7.7 K/uL Final   Lymphocytes Relative 09/14/2024 47  % Final   Lymphs  Abs 09/14/2024 2.3  0.7 - 4.0 K/uL Final   Monocytes Relative 09/14/2024 7  % Final   Monocytes Absolute 09/14/2024 0.3  0.1 - 1.0 K/uL Final   Eosinophils Relative 09/14/2024 7  % Final   Eosinophils Absolute 09/14/2024 0.3  0.0 - 0.5 K/uL Final   Basophils Relative 09/14/2024 1  % Final   Basophils Absolute 09/14/2024 0.0  0.0 - 0.1 K/uL Final   Immature Granulocytes 09/14/2024 0  % Final   Abs Immature Granulocytes 09/14/2024 0.01  0.00 - 0.07 K/uL Final   Troponin T High Sensitivity 09/14/2024 <15  0 - 19 ng/L Final   WBC 09/22/2024 5.4  4.0 - 10.5 K/uL Final   RBC 09/22/2024 3.89  3.87 - 5.11 MIL/uL Final   Hemoglobin 09/22/2024 11.7 (L)  12.0 - 15.0 g/dL Final   HCT 98/73/7973 36.0  36.0 - 46.0 % Final   MCV 09/22/2024 92.5  80.0 - 100.0 fL Final   MCH 09/22/2024 30.1  26.0 - 34.0 pg Final   MCHC 09/22/2024 32.5  30.0 - 36.0 g/dL Final   RDW 98/73/7973 13.2  11.5 - 15.5 % Final   Platelets 09/22/2024 207  150 - 400 K/uL Final   nRBC 09/22/2024 0.0  0.0 - 0.2 % Final  Neutrophils Relative % 09/22/2024 56  % Final   Neutro Abs 09/22/2024 3.1  1.7 - 7.7 K/uL Final   Lymphocytes Relative 09/22/2024 32  % Final   Lymphs Abs 09/22/2024 1.7  0.7 - 4.0 K/uL Final   Monocytes Relative 09/22/2024 6  % Final   Monocytes Absolute 09/22/2024 0.3  0.1 - 1.0 K/uL Final   Eosinophils Relative 09/22/2024 5  % Final   Eosinophils Absolute 09/22/2024 0.3  0.0 - 0.5 K/uL Final   Basophils Relative 09/22/2024 0  % Final   Basophils Absolute 09/22/2024 0.0  0.0 - 0.1 K/uL Final   Immature Granulocytes 09/22/2024 1  % Final   Abs Immature Granulocytes 09/22/2024 0.03  0.00 - 0.07 K/uL Final   WBC 09/23/2024 6.0  4.0 - 10.5 K/uL Final   RBC 09/23/2024 4.24  3.87 - 5.11 MIL/uL Final   Hemoglobin 09/23/2024 12.4  12.0 - 15.0 g/dL Final   HCT 98/72/7973 38.9  36.0 - 46.0 % Final   MCV 09/23/2024 91.7  80.0 - 100.0 fL Final   MCH 09/23/2024 29.2  26.0 - 34.0 pg Final   MCHC 09/23/2024 31.9  30.0 -  36.0 g/dL Final   RDW 98/72/7973 13.2  11.5 - 15.5 % Final   Platelets 09/23/2024 224  150 - 400 K/uL Final   nRBC 09/23/2024 0.0  0.0 - 0.2 % Final   Neutrophils Relative % 09/23/2024 44  % Final   Neutro Abs 09/23/2024 2.6  1.7 - 7.7 K/uL Final   Lymphocytes Relative 09/23/2024 41  % Final   Lymphs Abs 09/23/2024 2.5  0.7 - 4.0 K/uL Final   Monocytes Relative 09/23/2024 6  % Final   Monocytes Absolute 09/23/2024 0.4  0.1 - 1.0 K/uL Final   Eosinophils Relative 09/23/2024 8  % Final   Eosinophils Absolute 09/23/2024 0.5  0.0 - 0.5 K/uL Final   Basophils Relative 09/23/2024 1  % Final   Basophils Absolute 09/23/2024 0.0  0.0 - 0.1 K/uL Final   Immature Granulocytes 09/23/2024 0  % Final   Abs Immature Granulocytes 09/23/2024 0.02  0.00 - 0.07 K/uL Final    Vitals: Blood pressure (!) 147/95, pulse 87, temperature (!) 97.2 F (36.2 C), temperature source Oral, resp. rate 16, height 5' 7 (1.702 m), weight 103 kg, SpO2 100%.    Signed: Odis Cleveland, MD [facility] Physician, PGY-2 09/25/2024 11:22 AM

## 2024-09-25 NOTE — Plan of Care (Signed)
  Problem: Education: Goal: Emotional status will improve Outcome: Progressing   Problem: Activity: Goal: Interest or engagement in activities will improve Outcome: Progressing Goal: Sleeping patterns will improve Outcome: Progressing   Problem: Education: Goal: Mental status will improve Outcome: Not Progressing

## 2024-09-25 NOTE — BHH Group Notes (Signed)
 Adult Psychoeducational Group Note  Date:  09/25/2024 Time:  11:38 AM  Group Topic/Focus:  Developing a Wellness Toolbox:   The focus of this group is to help patients develop a wellness toolbox with skills and strategies to promote recovery upon discharge.  Participation Level:  Active  Participation Quality:  Attentive  Affect:  Appropriate  Cognitive:  Appropriate  Insight: Good  Engagement in Group:  Engaged  Modes of Intervention:  Discussion  Additional Comments:    [name] 09/25/2024, 11:38 AM

## 2024-09-26 MED ORDER — CLOZAPINE 25 MG PO TABS
250.0000 mg | ORAL_TABLET | Freq: Every day | ORAL | Status: DC
  Administered 2024-09-26 – 2024-09-27 (×2): 250 mg via ORAL
  Filled 2024-09-26 (×2): qty 2

## 2024-09-26 NOTE — Group Note (Signed)
 Date:  09/26/2024 Time:  8:37 PM  Group Topic/Focus:  Wrap-Up Group:   The focus of this group is to help patients review their daily goal of treatment and discuss progress on daily workbooks.    Participation Level:  Did Not Attend   Lillyian Heidt Dacosta 09/26/2024, 8:37 PM

## 2024-09-26 NOTE — Plan of Care (Signed)
" °  Problem: Education: Goal: Emotional status will improve Outcome: Progressing   Problem: Activity: Goal: Sleeping patterns will improve Outcome: Progressing   Problem: Education: Goal: Emotional status will improve Outcome: Progressing   Problem: Activity: Goal: Interest or engagement in activities will improve Outcome: Not Progressing   "

## 2024-09-26 NOTE — BHH Group Notes (Deleted)
 Patient did not attend the Spiritual Wellness group.

## 2024-09-26 NOTE — Progress Notes (Signed)
 Patient denies SI, HI, and VH this shift. Patient has had no incidents of behavioral dyscontrol this shift. [name] hygiene was addressed with her today as she smelled of urine, but patient stated she had already taken a shower and she did not need her clothing washed. Patient has attended groups and has been compliant with medications. Continue to monitor patient as planned. Patient able to contract for safety.

## 2024-09-26 NOTE — Care Management Important Message (Signed)
 Patient informed of right to appeal discharge, provided phone number to [employer]. Patient expressed no interest in appealing discharge at this time. CSW will continue to monitor situation.   Jakari Sada, LCSW 09/26/2024

## 2024-09-26 NOTE — Group Note (Signed)
 Recreation Therapy Group Note   Group Topic:Communication  Group Date: 09/26/2024 Start Time: 1020 End Time: 1040 Facilitators: Lonna Rabold-McCall, LRT,CTRS Location: [address] Dayroom   Group Topic: Communication, Problem Solving   Goal Area(s) Addresses:  Patient will effectively listen to complete activity.  Patient will identify communication skills used to make activity successful.  Patient will identify how skills used during activity can be used to reach post d/c goals.    Behavioral Response:   Intervention: Building Surveyor Activity - Geometric pattern cards, pencils, blank paper    Activity: Geometric Drawings.  Three volunteers from the peer group will be shown an abstract picture with a particular arrangement of geometrical shapes.  Each round, one 'speaker' will describe the pattern, as accurately as possible without revealing the image to the group.  The remaining group members will listen and draw the picture to reflect how it is described to them. Patients with the role of 'listener' cannot ask clarifying questions but, may request that the speaker repeat a direction. Once the drawings are complete, the presenter will show the rest of the group the picture and compare how close each person came to drawing the picture. LRT will facilitate a post-activity discussion regarding effective communication and the importance of planning, listening, and asking for clarification in daily interactions with others.  Education: Environmental consultant, Active listening, Support systems, Discharge planning  Education Outcome: Acknowledges understanding/In group clarification offered/Needs additional education.    Affect/Mood: N/A   Participation Level: Did not attend    Clinical Observations/Individualized Feedback:      Plan: Continue to engage patient in RT group sessions 2-3x/week.   Favor Hackler-McCall, LRT,CTRS 09/26/2024 12:46 PM

## 2024-09-26 NOTE — Progress Notes (Signed)
 [facility] Inpatient Psychiatry Progress Note  Date: 09/26/24 Patient: [name] MRN: [id number]  Assessment and Plan: [name] is a 54 y.o. female with a past psychiatric schizoaffective bipolar type, transported to the [facility] emergency department for disorganized thoughts, paranoia and refusal to take medications. She was petitioned for IVC by her ACT Team. She was transferred to the [facility] for symptomatic stabilization and medication management.  1/30: Increased her Clozapine  to 250mg  today. Can likely DC early next week.   # Schizoaffective disorder, bipolar type (HCC) -- Increased Clozaril  to 250 mg at bedtime for schizoaffective disorder -- Continue Prolixin  5 mg BID for refractory psychosis.  -- Metabolic profile and EKG monitoring obtained while on an atypical antipsychotic  BMI: 35.55 Lipid Panel: LDL 109 HbgA1c: 0.6 QTc: 459 -- Clozapine  labs ordered include: Initial CBC WNL , troponins Neg, proBNP negative - CK 107 and CK-MB 1.8  - CRP patient refused labs  - TSH WNL - RPR Neg - CBC 1/27: Eosinophils 0.3 --> 0.5. Absolute neutrophils 1.8 --> 2.6. --- Consider metformin  500 mg daily for antipsychotic induced metabolic syndrome, bowel regularity  #Tachycardia, resolved -Continue to monitor, consider propranolol  Risk Assessment - Mild: Patient with a prior history of suicidal ideations and suicide attempt, patient not currently endorsing any suicidal thoughts, however does not have great executive function given current psychiatric decompensation.  Patient also noncompliant on medications which puts her at an increased risk of self-harm   Discharge Planning Barriers to discharge: Medication compliance, Safety planning, contact with ACT Team  Estimated length of stay: 7- 13 days  Predicted Discharge location: Home  Preferred Phamarcy: [facility] on [address]  Interval History and update:  1/30 No  significant events overnight. Vital Signs are Stable. MAR was reviewed and patient was compliant with medications yesterday. They did  participate in groups yesterday. They received no PRN  yesterday. They spent 84% of their time in the bedroom and slept 16.5 hours over the last 24 hours. Case was discussed in the multidisciplinary team.   On interview patient was difficult to follow in conversation, but said that she would like her medications sent to the [facility] on [address]. Denied SI, HI, AH, VH. Reports having had a bowel movement yesterday.  Discussed plan with patient to which they were agreeable. Pt questions were answered.  Review of symptoms, specific for clozapine : Chest pain: denies Shortness of breath: denies  Exertional capacity: denies Tachycardia: None in morning vitals, denies  Cough: denies Fever: Normal temperature, denies Sedation: denies Orthostatic hypotension: denied Hypersalivation: denied Constipation: Denied Symptoms of GERD: denied Nausea: denied Nocturnal enuresis: endorsed some incontinence before she can reach the bathroom (assisted by walker.)  [name], Daughter, [phone] attempted x1 09/22/2024 11:55 pm: Did not respond.  Rea Acton: IVC Petitioner, ACT Team Member, [phone]) 09/17/2024  Apperance: Appropriate for environment and Laying in bed Behavior: Calm Speech: Fast (not pressured), Articulate, Normal Volume, and TALKATIVE Attitude: Cooperative Mood: good Affect: BLUNTED Perception: Not responding to internal stimuli Thought Content: difficult to follow Thought Form: DISORGANIZED (mild) Cognition: Alert & Oriented to person, place, and time, Recent and Remote memory grossly intact by recounting personal history, and Immediate memory grossly intact by interview Judgment: POOR Insight: POOR  Lab Results:  Admission on 09/12/2024  Component Date Value Ref Range Status   Total CK 09/14/2024 107  38 - 234 U/L Final    CK, MB 09/14/2024 1.8  0.5 - 5.0 ng/mL Final  Pro Brain Natriuretic Peptide 09/14/2024 <50.0  <300.0 pg/mL Final   Hgb A1c MFr Bld 09/14/2024 5.6  4.8 - 5.6 % Final   Mean Plasma Glucose 09/14/2024 114.02  mg/dL Final   TSH 98/81/7973 0.713  0.350 - 4.500 uIU/mL Final   Cholesterol 09/14/2024 170  0 - 200 mg/dL Final   Triglycerides 98/81/7973 67  <150 mg/dL Final   HDL 98/81/7973 47  >40 mg/dL Final   Total CHOL/HDL Ratio 09/14/2024 3.6  RATIO Final   VLDL 09/14/2024 13  0 - 40 mg/dL Final   LDL Cholesterol 09/14/2024 109 (H)  0 - 99 mg/dL Final   RPR Ser Ql 98/81/7973 NON REACTIVE  NON REACTIVE Final   WBC 09/14/2024 4.8  4.0 - 10.5 K/uL Final   RBC 09/14/2024 4.16  3.87 - 5.11 MIL/uL Final   Hemoglobin 09/14/2024 12.5  12.0 - 15.0 g/dL Final   HCT 98/81/7973 37.6  36.0 - 46.0 % Final   MCV 09/14/2024 90.4  80.0 - 100.0 fL Final   MCH 09/14/2024 30.0  26.0 - 34.0 pg Final   MCHC 09/14/2024 33.2  30.0 - 36.0 g/dL Final   RDW 98/81/7973 12.9  11.5 - 15.5 % Final   Platelets 09/14/2024 255  150 - 400 K/uL Final   nRBC 09/14/2024 0.0  0.0 - 0.2 % Final   Neutrophils Relative % 09/14/2024 38  % Final   Neutro Abs 09/14/2024 1.8  1.7 - 7.7 K/uL Final   Lymphocytes Relative 09/14/2024 47  % Final   Lymphs Abs 09/14/2024 2.3  0.7 - 4.0 K/uL Final   Monocytes Relative 09/14/2024 7  % Final   Monocytes Absolute 09/14/2024 0.3  0.1 - 1.0 K/uL Final   Eosinophils Relative 09/14/2024 7  % Final   Eosinophils Absolute 09/14/2024 0.3  0.0 - 0.5 K/uL Final   Basophils Relative 09/14/2024 1  % Final   Basophils Absolute 09/14/2024 0.0  0.0 - 0.1 K/uL Final   Immature Granulocytes 09/14/2024 0  % Final   Abs Immature Granulocytes 09/14/2024 0.01  0.00 - 0.07 K/uL Final   Troponin T High Sensitivity 09/14/2024 <15  0 - 19 ng/L Final   WBC 09/22/2024 5.4  4.0 - 10.5 K/uL Final   RBC 09/22/2024 3.89  3.87 - 5.11 MIL/uL Final   Hemoglobin 09/22/2024 11.7 (L)  12.0 - 15.0 g/dL Final   HCT  98/73/7973 36.0  36.0 - 46.0 % Final   MCV 09/22/2024 92.5  80.0 - 100.0 fL Final   MCH 09/22/2024 30.1  26.0 - 34.0 pg Final   MCHC 09/22/2024 32.5  30.0 - 36.0 g/dL Final   RDW 98/73/7973 13.2  11.5 - 15.5 % Final   Platelets 09/22/2024 207  150 - 400 K/uL Final   nRBC 09/22/2024 0.0  0.0 - 0.2 % Final   Neutrophils Relative % 09/22/2024 56  % Final   Neutro Abs 09/22/2024 3.1  1.7 - 7.7 K/uL Final   Lymphocytes Relative 09/22/2024 32  % Final   Lymphs Abs 09/22/2024 1.7  0.7 - 4.0 K/uL Final   Monocytes Relative 09/22/2024 6  % Final   Monocytes Absolute 09/22/2024 0.3  0.1 - 1.0 K/uL Final   Eosinophils Relative 09/22/2024 5  % Final   Eosinophils Absolute 09/22/2024 0.3  0.0 - 0.5 K/uL Final   Basophils Relative 09/22/2024 0  % Final   Basophils Absolute 09/22/2024 0.0  0.0 - 0.1 K/uL Final   Immature Granulocytes 09/22/2024 1  % Final  Abs Immature Granulocytes 09/22/2024 0.03  0.00 - 0.07 K/uL Final   WBC 09/23/2024 6.0  4.0 - 10.5 K/uL Final   RBC 09/23/2024 4.24  3.87 - 5.11 MIL/uL Final   Hemoglobin 09/23/2024 12.4  12.0 - 15.0 g/dL Final   HCT 98/72/7973 38.9  36.0 - 46.0 % Final   MCV 09/23/2024 91.7  80.0 - 100.0 fL Final   MCH 09/23/2024 29.2  26.0 - 34.0 pg Final   MCHC 09/23/2024 31.9  30.0 - 36.0 g/dL Final   RDW 98/72/7973 13.2  11.5 - 15.5 % Final   Platelets 09/23/2024 224  150 - 400 K/uL Final   nRBC 09/23/2024 0.0  0.0 - 0.2 % Final   Neutrophils Relative % 09/23/2024 44  % Final   Neutro Abs 09/23/2024 2.6  1.7 - 7.7 K/uL Final   Lymphocytes Relative 09/23/2024 41  % Final   Lymphs Abs 09/23/2024 2.5  0.7 - 4.0 K/uL Final   Monocytes Relative 09/23/2024 6  % Final   Monocytes Absolute 09/23/2024 0.4  0.1 - 1.0 K/uL Final   Eosinophils Relative 09/23/2024 8  % Final   Eosinophils Absolute 09/23/2024 0.5  0.0 - 0.5 K/uL Final   Basophils Relative 09/23/2024 1  % Final   Basophils Absolute 09/23/2024 0.0  0.0 - 0.1 K/uL Final   Immature Granulocytes  09/23/2024 0  % Final   Abs Immature Granulocytes 09/23/2024 0.02  0.00 - 0.07 K/uL Final    Vitals: Blood pressure (!) 130/93, pulse 93, temperature (!) 97.5 F (36.4 C), temperature source Oral, resp. rate 16, height 5' 7 (1.702 m), weight 103 kg, SpO2 99%.    Penne Mori, DO, PGY1 [facility] Psychiatry

## 2024-09-26 NOTE — Progress Notes (Incomplete)
(  Sleep Hours) -7.75  (Any PRNs that were needed, meds refused, or side effects to meds)- none  (Any disturbances and when (visitation, over night)-none  (Concerns raised by the patient)- none, pt stated she wanted the doctor to call her dad.   (SI/HI/AVH)-AH

## 2024-09-26 NOTE — BHH Group Notes (Signed)
 Patient did not attend the Occupational Therapy group.

## 2024-09-26 NOTE — BHH Group Notes (Signed)
 Pt. Did not attend the Recreation Group

## 2024-09-26 NOTE — BHH Group Notes (Signed)
 Adult Psychoeducational Group Note  Date:  09/26/2024 Time:  11:52 AM  Group Topic/Focus:  Wellness Toolbox:   The focus of this group is to discuss various aspects of wellness, balancing those aspects and exploring ways to increase the ability to experience wellness.  Patients will create a wellness toolbox for use upon discharge.  Participation Level:  Active  Participation Quality:  Attentive  Affect:  Appropriate  Cognitive:  Alert  Insight: Appropriate  Engagement in Group:  Engaged  Modes of Intervention:  Activity  Additional Comments:  Patient attended and participated in the Wellness group activity.  [name] 09/26/2024, 11:52 AM

## 2024-09-26 NOTE — BHH Group Notes (Signed)
 Adult Psychoeducational Group Note  Date:  09/26/2024 Time:  4:19 PM  Group Topic/Focus:  Wellness Toolbox:   The focus of this group is to discuss various aspects of wellness, balancing those aspects and exploring ways to increase the ability to experience wellness.  Patients will create a wellness toolbox for use upon discharge.  Participation Level:  Active  Participation Quality:  Attentive  Affect:  Appropriate  Cognitive:  Alert  Insight: Appropriate  Engagement in Group:  Engaged  Modes of Intervention:  Activity  Additional Comments:  Patient attended and participated in the Mental Health Video group.  [name] 09/26/2024, 4:19 PM

## 2024-09-26 NOTE — BHH Group Notes (Signed)
 The focus of this group is to help patients establish daily goals to achieve during treatment and discuss how the patient can incorporate goal setting into their daily lives to aide in recovery.  This patient did not attend the 9 am Goals Group

## 2024-09-26 NOTE — BHH Group Notes (Signed)
 Patient attended the Spiritual Wellness group.

## 2024-09-26 NOTE — [facility] Group Notes (Signed)
 Spirituality Group   Description: Participant directed exploration of values, beliefs and meaning   Following a brief framework of chaplains role and ground rules of group behavior, participants are invited to share concerns or questions that engage spiritual life. Emphasis placed on common themes and shared experiences and ways to make meaning and clarify living into ones values.   Theory/Process/Goal: Utilize the theoretical framework of group therapy established by Celena Kite, Relational Cultural Theory and Rogerian approaches to facilitate relational empathy and use of the here and now to foster reflection, self-awareness, and sharing.   Observations: Attended second 0.5 of group, active participant in group discussion.  [name] HERO.Div

## 2024-09-27 NOTE — [facility] Group Notes (Signed)
 Adult Psychoeducational Group Note  Date:  09/27/2024 Time:  9:40 AM  Group Topic/Focus:  Goals Group:   The focus of this group is to help patients establish daily goals to achieve during treatment and discuss how the patient can incorporate goal setting into their daily lives to aide in recovery.  Participation Level:    Participation Quality:    Affect:    Cognitive:    Insight:   Engagement in Group:    Modes of Intervention:    Additional Comments:    [name] 09/27/2024, 9:40 AM

## 2024-09-27 NOTE — [facility] Group Notes (Signed)
 Adult Psychoeducational Group Note  Date:  09/27/2024 Time:  4:18 PM  Group Topic/Focus:  Wellness Toolbox:   The focus of this group is to discuss various aspects of wellness, balancing those aspects and exploring ways to increase the ability to experience wellness.  Patients will create a wellness toolbox for use upon discharge.  Participation Level:  Did Not Attend  Participation Quality:    Affect:    Cognitive:    Insight:   Engagement in Group:    Modes of Intervention:    Additional Comments:    [name] 09/27/2024, 4:18 PM

## 2024-09-27 NOTE — [facility] Group Notes (Signed)
 Adult Psychoeducational Group Note  Date:  09/27/2024 Time:  11:37 AM  Group Topic/Focus:  Healthy Communication:   The focus of this group is to discuss communication, barriers to communication, as well as healthy ways to communicate with others.  Participation Level:  Did Not Attend  Participation Quality:    Affect:    Cognitive:    Insight:   Engagement in Group:    Modes of Intervention:    Additional Comments:    [name] 09/27/2024, 11:37 AM

## 2024-09-27 NOTE — Group Note (Signed)
 Date:  09/27/2024 Time:  8:35 PM  Group Topic/Focus:  Wrap-Up Group:   The focus of this group is to help patients review their daily goal of treatment and discuss progress on daily workbooks.    Participation Level:  Active  Participation Quality:  Appropriate  Affect:  Appropriate  Cognitive:  Appropriate  Insight: Good  Engagement in Group:  Engaged  Modes of Intervention:  Education  Additional Comments:  Patient atended and participated in group tonight. She reports that her goal today was to follow the direction of the Doctors and Nurses.  Today she contacted her support system. She is getting ready for discharge.  [name] 09/27/2024, 8:35 PM

## 2024-09-27 NOTE — Plan of Care (Signed)
   Problem: Education: Goal: Emotional status will improve Outcome: Progressing Goal: Mental status will improve Outcome: Progressing Goal: Verbalization of understanding the information provided will improve Outcome: Progressing

## 2024-09-28 MED ORDER — FLUPHENAZINE HCL 5 MG PO TABS
10.0000 mg | ORAL_TABLET | Freq: Every day | ORAL | Status: DC
  Administered 2024-09-28 – 2024-10-01 (×4): 10 mg via ORAL
  Filled 2024-09-28 (×4): qty 2

## 2024-09-28 MED ORDER — FLUPHENAZINE HCL 5 MG PO TABS
5.0000 mg | ORAL_TABLET | Freq: Every day | ORAL | Status: DC
  Administered 2024-09-29 – 2024-10-02 (×4): 5 mg via ORAL
  Filled 2024-09-28 (×4): qty 1

## 2024-09-28 MED ORDER — CLOZAPINE 100 MG PO TABS
300.0000 mg | ORAL_TABLET | Freq: Every day | ORAL | Status: DC
  Administered 2024-09-28 – 2024-10-02 (×5): 300 mg via ORAL
  Filled 2024-09-28 (×5): qty 3

## 2024-09-28 MED ORDER — AMLODIPINE BESYLATE 5 MG PO TABS
5.0000 mg | ORAL_TABLET | Freq: Every day | ORAL | Status: DC
  Administered 2024-09-28 – 2024-09-29 (×2): 5 mg via ORAL
  Filled 2024-09-28 (×2): qty 1

## 2024-09-28 NOTE — [facility] Group Notes (Signed)
 Adult Psychoeducational Group Note  Date:  09/28/2024 Time:  9:27 AM  Group Topic/Focus:  Goals Group:   The focus of this group is to help patients establish daily goals to achieve during treatment and discuss how the patient can incorporate goal setting into their daily lives to aide in recovery.  Participation Level:  Did Not Attend  Participation Quality:    Affect:    Cognitive:    Insight:   Engagement in Group:    Modes of Intervention:    Additional Comments:    [name] 09/28/2024, 9:27 AM

## 2024-09-28 NOTE — Progress Notes (Addendum)
 [facility] Inpatient Psychiatry Progress Note  Date: 09/28/24 Patient: [name] MRN: [id number]  Assessment and Plan: Felicite Zeimet is a 54 y.o. female with a past psychiatric schizoaffective bipolar type, transported to the [facility] emergency department for disorganized thoughts, paranoia and refusal to take medications. She was petitioned for IVC by her ACT Team. She was transferred to the [facility] for symptomatic stabilization and medication management.  2/1: Patient has exhibited essentially no change since I began looking after her approximately a week ago.  Will further increase clozapine  to 300 mg nightly and increase Prolixin  from 5 mg twice daily to 5 daily +10 mg nightly.  Will start amlodipine  for patient's persistently high blood pressures.  On Monday, scheduled call to [facility] LPN Rudy [phone] to give updates and discuss discharge planning.  No new clozapine  ROS, awaiting CBC ordered for today but not drawn.   # Schizoaffective disorder, bipolar type (HCC) -- Increase Clozaril  250 ? 300 mg at bedtime for schizoaffective disorder -- Increase Prolixin  5 mg BID ? 5 mg daily + 10 mg nightly for refractory psychosis.  -- Metabolic profile and EKG monitoring obtained while on an atypical antipsychotic  BMI: 35.55 Lipid Panel: LDL 109 HbgA1c: 0.6 QTc: 459 -- Clozapine  labs ordered include: Initial CBC WNL , troponins Neg, proBNP negative - CK 107 and CK-MB 1.8  - CRP patient refused labs  - TSH WNL - RPR Neg - CBC 1/27: Eosinophils 0.3 --> 0.5. Absolute neutrophils 1.8 --> 2.6. --- Consider metformin  500 mg daily for antipsychotic induced metabolic syndrome, bowel regularity  #Tachycardia, resolved -Continue to monitor, consider propranolol  #Hypertension -Start amlodipine  5 mg  Risk Assessment - Mild: Patient with a prior history of suicidal ideations and suicide attempt, patient not currently endorsing any  suicidal thoughts, however does not have great executive function given current psychiatric decompensation.  Patient also noncompliant on medications which puts her at an increased risk of self-harm   Discharge Planning Barriers to discharge: Medication compliance, Safety planning, contact with ACT Team  Estimated length of stay: 7- 13 days  Predicted Discharge location: Home  Preferred Ph[facility]: [facility] on [address]  Interval History and update:  1/31 Vitals:   09/27/24 1806 09/28/24 0635  BP: (!) 153/102 130/89  Pulse: (!) 106 98  Resp:    Temp: (!) 97.4 F (36.3 C)   SpO2: 100% 98%   HR 106 --> 98. 153/102 --> 130/89. No medication refusals. No PRNs. Slept 6.5 hours. No concerns. Denied SI, HI and AVH.   Patient was interviewed in day room, where she said I am so scared for you doctors.  It was unclear what she means by this, but she did appear worried about my wellbeing coming into the hospital.  Still very disorganized in conversation but was able to deny suicidal ideation, homicidal ideation as well as auditory and visual hallucinations.  Patient is fixated on discharge this coming Thursday.  Noted some weariness this morning and that her glasses were gone.  Denied suicidal and homicidal ideation.  No auditory or visual hallucinations.  Is sleeping well and eating well.  Review of symptoms, specific for clozapine : Chest pain: denies Shortness of breath: denies  Exertional capacity: denies Tachycardia: None in morning vitals, denies  Cough: denies Fever: Normal temperature, denies Sedation: denies Orthostatic hypotension: denied Hypersalivation: Endorsed, but unwilling to try any other medications. Constipation: Denied Symptoms of GERD: denied Nausea: denied Nocturnal enuresis: endorsed some incontinence before she can reach  the bathroom (assisted by walker.)  [name], Daughter, [phone] attempted x1 09/22/2024 11:55 pm: Did not respond.  Rea Acton: IVC Petitioner, ACT Team Member, [phone]) 09/17/2024  Collateral information obtained Guenevere Roorda, phone number: [phone], patient's father) Patient granted permission to speak to contact person without restrictions. Date of call: 1/31 Time of call: 1145 Number of call attempts: 2 Voicemail left: 1 Confirmed patient details via: Name, Birth date , and Relationship  Main Content: Last spoke to patient yesterday. Patient has improved since her admission. Reports that yesterday 1/30 she was a little agitated and wanted to be home. She worries a lot about her family, including her three children.   Patient has had varying levels of functioning over the last 20 years. Sometimes in a group home, sometimes independent. Sometimes even able to drive a car.   Has seemed worse in the last year or so. Patient refuses to take her medications, particularly the previous shot she was on (invega ).  Known medication allergies? haloperidol Is contact aware of statements from patient that indicate intent/plan to self harm? No. Is contact aware of patient's adherence to prescribed medications? Poor.  Collateral contact denies presence of firearms or large stockpiles of pills at home.  During this conversation, I explained in simple terms the patient's mental health condition, answered questions pertaining to the patient's current treatment and provided updates, outlined the treatment plan moving forward, and provided guidance on safety planning (ie securing firearms, safe medication allocation, etc).  09/28/2024 2:41 PM  Appearance: Appropriate for environment and Laying in bed Behavior: Calm Speech: Normal Rate, Articulate, Normal Volume, and TALKATIVE Attitude: Cooperative Mood: Tired Affect: BLUNTED Perception: Not responding to internal stimuli Thought Content: difficult to follow Thought Form: DISORGANIZED (mild) Cognition: Alert & Oriented to person, place, and time, Recent  and Remote memory grossly intact by recounting personal history, and Immediate memory grossly intact by interview Judgment: POOR Insight: POOR  Lab Results:  Admission on 09/12/2024  Component Date Value Ref Range Status   Total CK 09/14/2024 107  38 - 234 U/L Final   CK, MB 09/14/2024 1.8  0.5 - 5.0 ng/mL Final   Pro Brain Natriuretic Peptide 09/14/2024 <50.0  <300.0 pg/mL Final   Hgb A1c MFr Bld 09/14/2024 5.6  4.8 - 5.6 % Final   Mean Plasma Glucose 09/14/2024 114.02  mg/dL Final   TSH 98/81/7973 0.713  0.350 - 4.500 uIU/mL Final   Cholesterol 09/14/2024 170  0 - 200 mg/dL Final   Triglycerides 98/81/7973 67  <150 mg/dL Final   HDL 98/81/7973 47  >40 mg/dL Final   Total CHOL/HDL Ratio 09/14/2024 3.6  RATIO Final   VLDL 09/14/2024 13  0 - 40 mg/dL Final   LDL Cholesterol 09/14/2024 109 (H)  0 - 99 mg/dL Final   RPR Ser Ql 98/81/7973 NON REACTIVE  NON REACTIVE Final   WBC 09/14/2024 4.8  4.0 - 10.5 K/uL Final   RBC 09/14/2024 4.16  3.87 - 5.11 MIL/uL Final   Hemoglobin 09/14/2024 12.5  12.0 - 15.0 g/dL Final   HCT 98/81/7973 37.6  36.0 - 46.0 % Final   MCV 09/14/2024 90.4  80.0 - 100.0 fL Final   MCH 09/14/2024 30.0  26.0 - 34.0 pg Final   MCHC 09/14/2024 33.2  30.0 - 36.0 g/dL Final   RDW 98/81/7973 12.9  11.5 - 15.5 % Final   Platelets 09/14/2024 255  150 - 400 K/uL Final   nRBC 09/14/2024 0.0  0.0 - 0.2 % Final  Neutrophils Relative % 09/14/2024 38  % Final   Neutro Abs 09/14/2024 1.8  1.7 - 7.7 K/uL Final   Lymphocytes Relative 09/14/2024 47  % Final   Lymphs Abs 09/14/2024 2.3  0.7 - 4.0 K/uL Final   Monocytes Relative 09/14/2024 7  % Final   Monocytes Absolute 09/14/2024 0.3  0.1 - 1.0 K/uL Final   Eosinophils Relative 09/14/2024 7  % Final   Eosinophils Absolute 09/14/2024 0.3  0.0 - 0.5 K/uL Final   Basophils Relative 09/14/2024 1  % Final   Basophils Absolute 09/14/2024 0.0  0.0 - 0.1 K/uL Final   Immature Granulocytes 09/14/2024 0  % Final   Abs Immature  Granulocytes 09/14/2024 0.01  0.00 - 0.07 K/uL Final   Troponin T High Sensitivity 09/14/2024 <15  0 - 19 ng/L Final   WBC 09/22/2024 5.4  4.0 - 10.5 K/uL Final   RBC 09/22/2024 3.89  3.87 - 5.11 MIL/uL Final   Hemoglobin 09/22/2024 11.7 (L)  12.0 - 15.0 g/dL Final   HCT 98/73/7973 36.0  36.0 - 46.0 % Final   MCV 09/22/2024 92.5  80.0 - 100.0 fL Final   MCH 09/22/2024 30.1  26.0 - 34.0 pg Final   MCHC 09/22/2024 32.5  30.0 - 36.0 g/dL Final   RDW 98/73/7973 13.2  11.5 - 15.5 % Final   Platelets 09/22/2024 207  150 - 400 K/uL Final   nRBC 09/22/2024 0.0  0.0 - 0.2 % Final   Neutrophils Relative % 09/22/2024 56  % Final   Neutro Abs 09/22/2024 3.1  1.7 - 7.7 K/uL Final   Lymphocytes Relative 09/22/2024 32  % Final   Lymphs Abs 09/22/2024 1.7  0.7 - 4.0 K/uL Final   Monocytes Relative 09/22/2024 6  % Final   Monocytes Absolute 09/22/2024 0.3  0.1 - 1.0 K/uL Final   Eosinophils Relative 09/22/2024 5  % Final   Eosinophils Absolute 09/22/2024 0.3  0.0 - 0.5 K/uL Final   Basophils Relative 09/22/2024 0  % Final   Basophils Absolute 09/22/2024 0.0  0.0 - 0.1 K/uL Final   Immature Granulocytes 09/22/2024 1  % Final   Abs Immature Granulocytes 09/22/2024 0.03  0.00 - 0.07 K/uL Final   WBC 09/23/2024 6.0  4.0 - 10.5 K/uL Final   RBC 09/23/2024 4.24  3.87 - 5.11 MIL/uL Final   Hemoglobin 09/23/2024 12.4  12.0 - 15.0 g/dL Final   HCT 98/72/7973 38.9  36.0 - 46.0 % Final   MCV 09/23/2024 91.7  80.0 - 100.0 fL Final   MCH 09/23/2024 29.2  26.0 - 34.0 pg Final   MCHC 09/23/2024 31.9  30.0 - 36.0 g/dL Final   RDW 98/72/7973 13.2  11.5 - 15.5 % Final   Platelets 09/23/2024 224  150 - 400 K/uL Final   nRBC 09/23/2024 0.0  0.0 - 0.2 % Final   Neutrophils Relative % 09/23/2024 44  % Final   Neutro Abs 09/23/2024 2.6  1.7 - 7.7 K/uL Final   Lymphocytes Relative 09/23/2024 41  % Final   Lymphs Abs 09/23/2024 2.5  0.7 - 4.0 K/uL Final   Monocytes Relative 09/23/2024 6  % Final   Monocytes Absolute  09/23/2024 0.4  0.1 - 1.0 K/uL Final   Eosinophils Relative 09/23/2024 8  % Final   Eosinophils Absolute 09/23/2024 0.5  0.0 - 0.5 K/uL Final   Basophils Relative 09/23/2024 1  % Final   Basophils Absolute 09/23/2024 0.0  0.0 - 0.1 K/uL Final   Immature  Granulocytes 09/23/2024 0  % Final   Abs Immature Granulocytes 09/23/2024 0.02  0.00 - 0.07 K/uL Final    Vitals: Blood pressure 130/89, pulse 98, temperature (!) 97.4 F (36.3 C), temperature source Oral, resp. rate 16, height 5' 7 (1.702 m), weight 103 kg, SpO2 98%.   Signed: Odis Cleveland, MD [facility] Physician, PGY-2 09/28/2024 2:41 PM

## 2024-09-28 NOTE — Plan of Care (Signed)
   Problem: Education: Goal: Knowledge of [facility] Education information/materials will improve Outcome: Progressing Goal: Emotional status will improve Outcome: Progressing Goal: Mental status will improve Outcome: Progressing Goal: Verbalization of understanding the information provided will improve Outcome: Progressing   Problem: Activity: Goal: Interest or engagement in activities will improve Outcome: Progressing

## 2024-09-28 NOTE — [facility] Group Notes (Signed)
 Adult Psychoeducational Group Note  Date:  09/28/2024 Time:  9:28 AM  Group Topic/Focus:  Goals Group:   The focus of this group is to help patients establish daily goals to achieve during treatment and discuss how the patient can incorporate goal setting into their daily lives to aide in recovery.  Participation Level:  Did Not Attend  Participation Quality:    Affect:    Cognitive:    Insight:   Engagement in Group:    Modes of Intervention:    Additional Comments:    [name] 09/28/2024, 9:28 AM

## 2024-09-28 NOTE — Group Note (Signed)
 Date:  09/28/2024 Time:  8:34 PM  Group Topic/Focus:  Wrap-Up Group:   The focus of this group is to help patients review their daily goal of treatment and discuss progress on daily workbooks.    Participation Level:  Did Not Attend   Lileigh Fahringer Dacosta 09/28/2024, 8:34 PM

## 2024-09-28 NOTE — Progress Notes (Signed)
(  Sleep Hours) -6.5 (Any PRNs that were needed, meds refused, or side effects to meds)- no (Any disturbances and when (visitation, over night)- none reported (Concerns raised by the patient)- none reported (SI/HI/AVH)- denies all

## 2024-09-28 NOTE — [facility] Group Notes (Signed)
 Adult Psychoeducational Group Note  Date:  09/28/2024 Time:  11:21 AM  Group Topic/Focus:  Developing a Wellness Toolbox:   The focus of this group is to help patients develop a wellness toolbox with skills and strategies to promote recovery upon discharge.  Participation Level:  Did Not Attend  Participation Quality:    Affect:    Cognitive:    Insight:   Engagement in Group:    Modes of Intervention:    Additional Comments:    [name] 09/28/2024, 11:21 AM

## 2024-09-28 NOTE — Plan of Care (Signed)
   Problem: Education: Goal: Emotional status will improve Outcome: Progressing Goal: Mental status will improve Outcome: Progressing   Problem: Activity: Goal: Interest or engagement in activities will improve Outcome: Progressing Goal: Sleeping patterns will improve Outcome: Progressing

## 2024-09-28 NOTE — [facility] Group Notes (Signed)
 Adult Psychoeducational Group Note  Date:  09/28/2024 Time:  5:08 PM  Group Topic/Focus:  Wellness Toolbox:   The focus of this group is to discuss various aspects of wellness, balancing those aspects and exploring ways to increase the ability to experience wellness.  Patients will create a wellness toolbox for use upon discharge.  Participation Level:  Did Not Attend  Participation Quality:    Affect:    Cognitive:    Insight:   Engagement in Group:    Modes of Intervention:    Additional Comments:    [name] 09/28/2024, 5:08 PM

## 2024-09-29 MED ORDER — LIDOCAINE 5 % EX PTCH
1.0000 | MEDICATED_PATCH | Freq: Every day | CUTANEOUS | Status: DC
  Administered 2024-09-30 – 2024-10-03 (×2): 1 via TRANSDERMAL
  Filled 2024-09-29 (×4): qty 1

## 2024-09-29 MED ORDER — LIDOCAINE 5 % EX PTCH: 1.0000 | MEDICATED_PATCH | CUTANEOUS | Status: DC

## 2024-09-29 MED ORDER — AMLODIPINE BESYLATE 10 MG PO TABS
10.0000 mg | ORAL_TABLET | Freq: Every day | ORAL | Status: DC
  Administered 2024-09-30 – 2024-10-03 (×4): 10 mg via ORAL
  Filled 2024-09-29 (×4): qty 1

## 2024-09-29 MED ORDER — IBUPROFEN 400 MG PO TABS: 400.0000 mg | ORAL_TABLET | Freq: Four times a day (QID) | ORAL | Status: DC | PRN

## 2024-09-29 NOTE — Progress Notes (Signed)
(  Sleep Hours) -6.25  (Any PRNs that were needed, meds refused, or side effects to meds)- inhaler  (Any disturbances and when (visitation, over night)-none  (Concerns raised by the patient)- none  (SI/HI/AVH)-AH
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
## 2024-09-29 NOTE — Progress Notes (Signed)
 [facility] Inpatient Psychiatry Progress Note  Date: 09/29/24 Patient: [name] MRN: [id number]  Assessment and Plan: [name] is a 54 y.o. female with a past psychiatric schizoaffective bipolar type, transported to the [facility] emergency department for disorganized thoughts, paranoia and refusal to take medications. She was petitioned for IVC by her ACT Team. She was transferred to the [facility] for symptomatic stabilization and medication management.  Titrate on clozapine  to 300 and tolerating without major side effects and will get a level.  Titrating Prolixin  as second antipsychotic due to refractory psychosis based on clinical response.  Will follow-up with [facility] LPN Rudy [phone] regarding discharge planning.   # Schizoaffective disorder, bipolar type (HCC) -- Continue Clozaril  300 mg at bedtime for schizoaffective disorder -- Continue Prolixin  5 mg daily + 10 mg nightly for refractory psychosis.  -- Clozapine  labs 2/3  Clozapine  level  Neutrophil count  #Tachycardia -Continue to monitor, consider propranolol  #Hypertension -- Increase amlodipine  10 mg    Risk Assessment - Mild: Patient with a prior history of suicidal ideations and suicide attempt, patient not currently endorsing any suicidal thoughts, however does not have great executive function given current psychiatric decompensation.  Patient also noncompliant on medications which puts her at an increased risk of self-harm   Discharge Planning Barriers to discharge: Medication compliance, Safety planning, contact with ACT Team  Estimated length of stay: TBD pending ACATT  Predicted Discharge location: Home  Preferred Phamarcy: [facility] on [address]     Interval History and update:  Reports that she wants to leave the hospital.  Says that she is back to her baseline and wants to get out of here.  Reports that she is in the hospital from strangers  IV seeing her.  Reports that she is here voluntarily.  Reports that she is okay with this calling the ACT team but reports that the ACT team does not know her at all.  Reports that she is okay with us  calling the father.  Denies any side effects medications. Pt denies extrapyramidal symptoms including dystonia (sudden spastic contractions of muscle groups), parkinsonism (bradykinesia, tremors, rigidity), and akathisia (severe restlessness).   Collateral 2/2, [facility] LPN Rudy [phone]: No response and left voicemail.  Review of symptoms, specific for clozapine : Chest pain: denies Shortness of breath: denies  Exertional capacity: denies Tachycardia: Positive on vitals Cough: denies Fever: Normal temperature, denies Sedation: denies Orthostatic hypotension: Denies Hypersalivation: Stable Constipation: Endorses bowel movement in last 24 hours Symptoms of GERD: Denies Nausea: Denies Nocturnal enuresis: Chronic     Objective Neuro - no focal deficits, ambulates with walker, oriented to person place and time, attention grossly intact  MSE Appearance: Older appearing African-American female, overweight, fair hygiene, laying in bed, yells at provider to come into the room and walking by her room are you my doctor Attitude: Assertive Speech: Increased volume Thought process: Disorganized Thought content: Illogical, paranoid, chronic delusions Perception: Denies AVH.  Not responding to internal stimuli SI/HI: Denies Insight/judgment: Poor/poor  Lab Results:  -- Clozapine  labs ordered include: Initial CBC WNL , troponins Neg, proBNP negative - CK 107 and CK-MB 1.8  - CRP patient refused labs  - TSH WNL - RPR Neg - CBC 1/27: Eosinophils 0.3 --> 0.5. Absolute neutrophils 1.8 --> 2.6.    Vitals: Blood pressure 130/80, pulse 91, temperature (!) 97.4 F (36.3 C), temperature source Oral, resp. rate 16, height 5' 7 (1.702 m), weight 103 kg, SpO2 100%.   Signed:  Justino Cornish,  MD PGY-2 Psychiatry Resident 09/29/2024, 5:57 PM

## 2024-09-29 NOTE — Group Note (Signed)
 Date:  09/29/2024 Time:  10:23 PM  Group Topic/Focus:  Wrap-Up Group:   The focus of this group is to help patients review their daily goal of treatment and discuss progress on daily workbooks.    Participation Level:  Did Not Attend   [name] 09/29/2024, 10:23 PM

## 2024-09-29 NOTE — Plan of Care (Signed)
   Problem: Education: Goal: Emotional status will improve Outcome: Progressing Goal: Mental status will improve Outcome: Progressing

## 2024-09-29 NOTE — Group Note (Signed)
 Recreation Therapy Group Note   Group Topic:Coping Skills  Group Date: 09/29/2024 Start Time: 1031 End Time: 1057 Facilitators: Aleczander Fandino-McCall, LRT,CTRS Location: [address] Day[address]   Group Topic: Communication, Team Building, Problem Solving  Goal Area(s) Addresses:  Patient will effectively work with peer towards shared goal.  Patient will identify skill used to make activity successful.  Patient will identify how skills used during activity can be used to reach post d/c goals.   Behavioral Response: Appropriate  Intervention: Cup [employer] Officer bands with attached strings enough for each group member, 10 or more cups  Activity: Patient(s) were given a set of solo cups, a rubber band, and some tied strings. The objective is to build a pyramid with the cups by only using the rubber band and string to move the cups. After the activity the patient(s) are LRT debriefed and discussed what strategies worked, what didn't, and what lessons they can take from the activity and use in life post discharge.   Education Areas: Social Skills, Support System, Discharge Planning   Education Outcome: Acknowledges education   Affect/Mood: Drowsy   Participation Level: Active   Participation Quality: Independent   Behavior: Appropriate   Speech/Thought Process: Relevant   Insight: Fair   Judgement: Fair    Modes of Intervention: Group work   Patient Response to Interventions:  Receptive   Education Outcome:  In group clarification offered    Clinical Observations/Individualized Feedback: Pt having just woken up when she came to group. Pt couldn't remember why she was here and stated someone IVC'd her. Pt was able to identify a few coping skills to the best of her ability having just woken up.     Plan: Continue to engage patient in RT group sessions 2-3x/week.   Tamorah Hada-McCall, LRT,CTRS 09/29/2024 1:48 PM

## 2024-09-29 NOTE — Plan of Care (Signed)
   Problem: Education: Goal: Emotional status will improve Outcome: Progressing Goal: Mental status will improve Outcome: Progressing   Problem: Activity: Goal: Interest or engagement in activities will improve Outcome: Progressing Goal: Sleeping patterns will improve Outcome: Progressing

## 2024-09-30 ENCOUNTER — Encounter (HOSPITAL_COMMUNITY): Payer: Self-pay

## 2024-09-30 LAB — CBC WITH DIFFERENTIAL/PLATELET
Abs Immature Granulocytes: 0.15 10*3/uL — ABNORMAL HIGH (ref 0.00–0.07)
Basophils Absolute: 0.1 10*3/uL (ref 0.0–0.1)
Basophils Relative: 1 %
Eosinophils Absolute: 0.5 10*3/uL (ref 0.0–0.5)
Eosinophils Relative: 6 %
HCT: 37.6 % (ref 36.0–46.0)
Hemoglobin: 11.7 g/dL — ABNORMAL LOW (ref 12.0–15.0)
Immature Granulocytes: 2 %
Lymphocytes Relative: 31 %
Lymphs Abs: 2.4 10*3/uL (ref 0.7–4.0)
MCH: 29 pg (ref 26.0–34.0)
MCHC: 31.1 g/dL (ref 30.0–36.0)
MCV: 93.3 fL (ref 80.0–100.0)
Monocytes Absolute: 0.7 10*3/uL (ref 0.1–1.0)
Monocytes Relative: 9 %
Neutro Abs: 3.9 10*3/uL (ref 1.7–7.7)
Neutrophils Relative %: 51 %
Platelets: 276 10*3/uL (ref 150–400)
RBC: 4.03 MIL/uL (ref 3.87–5.11)
RDW: 13.4 % (ref 11.5–15.5)
WBC: 7.7 10*3/uL (ref 4.0–10.5)
nRBC: 0 % (ref 0.0–0.2)

## 2024-09-30 NOTE — [facility] Group Notes (Signed)
 Adult Psychoeducational Group Note  Date:  09/30/2024 Time:  9:39 AM  Group Topic/Focus:  Goals Group:   The focus of this group is to help patients establish daily goals to achieve during treatment and discuss how the patient can incorporate goal setting into their daily lives to aide in recovery. Orientation:   The focus of this group is to educate the patient on the purpose and policies of crisis stabilization and provide a format to answer questions about their admission.  The group details unit policies and expectations of patients while admitted.  Participation Level:  Did Not Attend  [name] 09/30/2024, 9:39 AM

## 2024-09-30 NOTE — [facility] Group Notes (Signed)
 Adult Psychoeducational Group Note  Date:  09/30/2024 Time:  1:16 PM  Group Topic/Focus: Medication Group  Participation Level:  Active  Participation Quality:  Appropriate  Affect:  Appropriate  Cognitive:  Appropriate  Insight: Appropriate  Engagement in Group:  Engaged  Modes of Intervention:  Activity  Additional Comments:   Debbera Wolken 09/30/2024, 1:16 PM

## 2024-09-30 NOTE — Group Note (Signed)
 Recreation Therapy Group Note   Group Topic:Health and Wellness  Group Date: 09/30/2024 Start Time: 1039 End Time: 1059 Facilitators: Orilla Templeman-McCall, LRT,CTRS Location: [address] Dayroom   Group Topic: Exercise/Wellness  Goal Area(s) Addresses:  Patient will lead group in various exercise positions. Patient will verbalize benefit of exercise during group session. Patient will acknowledge benefits of exercise when used as a coping mechanism.   Behavioral Response: Minimal  Intervention: Music  Activity: LRT and patients went over the importance of exercise in daily living. Patients were in charge of what and how the exercises were presented. Patients took turns leading the group in the exercises of their choosing so they determined how hard/easy the group was. The group was to go at least 3 rounds and patients could take breaks or get water  as needed.  Education: Physical Activity, Health and Wellness  Education Outcome: Acknowledges understanding/In group clarification offered/Needs additional education.    Affect/Mood: Appropriate   Participation Level: Minimal   Participation Quality: Independent   Behavior: Appropriate   Speech/Thought Process: Relevant   Insight: Moderate   Judgement: Moderate   Modes of Intervention: Music   Patient Response to Interventions:  Attentive   Education Outcome:  In group clarification offered    Clinical Observations/Individualized Feedback: Pt led group in one stretching exercise. Pt sat and observed for remainder of time she was in group.     Plan: Continue to engage patient in RT group sessions 2-3x/week.   Jyoti Harju-McCall, LRT,CTRS 09/30/2024 12:44 PM

## 2024-09-30 NOTE — Group Note (Signed)
 Date:  09/30/2024 Time:  4:15 PM  Group Topic/Focus:  Wellness Toolbox:   The focus of this group is to discuss various aspects of wellness, balancing those aspects and exploring ways to increase the ability to experience wellness.  Patients will create a wellness toolbox for use upon discharge.    Participation Level:  Did Not Attend    [name] 09/30/2024, 4:15 PM

## 2024-09-30 NOTE — Progress Notes (Signed)
 Collateral contact   Leah (Team Lead) Merrily HUTCH [phone] will visit patient tomorrow, Wednesday, 2/4 at 11 AM.   Maelys Kinnick, LCSW 09/30/2024

## 2024-09-30 NOTE — Plan of Care (Signed)
   Problem: Education: Goal: Emotional status will improve Outcome: Progressing Goal: Mental status will improve Outcome: Progressing

## 2024-09-30 NOTE — Progress Notes (Signed)
 IVC court hearing   A court hearing was held with [name] presiding.     Patient participated in the hearing.  [name] granted an additional 7 days.     Yvette Balfour, LCSW

## 2024-09-30 NOTE — [facility] Group Notes (Signed)
 Adult Psychoeducational Group Note  Date:  09/30/2024 Time:  11:23 AM  Group Topic/Focus: Rec. Therapy  Participation Level:  Active  Participation Quality:  Appropriate  Affect:  Appropriate  Cognitive:  Appropriate  Insight: Appropriate  Engagement in Group:  Engaged  Modes of Intervention:  Activity  Additional Comments:   Semaja Lymon 09/30/2024, 11:23 AM

## 2024-09-30 NOTE — Plan of Care (Signed)
" °  Problem: Education: Goal: Emotional status will improve Outcome: Progressing Goal: Mental status will improve Outcome: Progressing   Problem: Activity: Goal: Interest or engagement in activities will improve Outcome: Progressing   Problem: Coping: Goal: Ability to verbalize frustrations and anger appropriately will improve Outcome: Progressing   Problem: Education: Goal: Emotional status will improve Outcome: Progressing   "

## 2024-09-30 NOTE — Progress Notes (Signed)
(  Sleep Hours) -8  (Any PRNs that were needed, meds refused, or side effects to meds)- none  (Any disturbances and when (visitation, over night)-none  (Concerns raised by the patient)- pt visibly better on the milieu   (SI/HI/AVH)-AH

## 2024-10-01 MED ORDER — FLUPHENAZINE DECANOATE 25 MG/ML IJ SOLN
25.0000 mg | INTRAMUSCULAR | Status: DC
  Administered 2024-10-02: 25 mg via INTRAMUSCULAR
  Filled 2024-10-01: qty 5

## 2024-10-01 NOTE — Progress Notes (Signed)
(  Sleep Hours) -8.25  (Any PRNs that were needed, meds refused, or side effects to meds)- none  (Any disturbances and when (visitation, over night)-denies  (Concerns raised by the patient)- pt wanted to make sure she was doing eveything in compliance  (SI/HI/AVH)-AH

## 2024-10-01 NOTE — Progress Notes (Addendum)
 Collateral contact   Rea Acton (Team Lead) from [place] [name] visited patient in person, and they met in the conference room.  Rea said that patient is doing better.   Deylan Canterbury, LCSW 10/01/2024

## 2024-10-01 NOTE — Progress Notes (Signed)
 [facility] Inpatient Psychiatry Progress Note  Date: 10/01/24 Patient: [name] MRN: [id number]  Assessment and Plan: Suleika Donavan is a 54 y.o. female with a past psychiatric schizoaffective bipolar type, transported to the [facility] emergency department for disorganized thoughts, paranoia and refusal to take medications. She was petitioned for IVC by her ACT Team. Titrate on clozapine  to 300 and tolerating without major side effects and will get a level.  Titrating Prolixin  as second antipsychotic due to refractory psychosis based on clinical response.  [name] requested transition Prolixin  to long-acting injectable given her history of nonadherence and patient is agreeable to this, so we will initiate Prolixin  decanoate 25 mg with plan for next LAI in 4 weeks but could be given as early as 3 weeks or even 2 weeks if having return of psychosis, per outpatient team.  Clozapine  level still pending.  Plan to discharge to ACT team on Friday, as they believe she is at baseline.   # Schizoaffective disorder, bipolar type (HCC) -- Continue Clozaril  300 mg at bedtime for schizoaffective disorder -- Continue Prolixin  5 mg daily + 10 mg nightly for refractory psychosis; start prolixin  decannulate 25 mg LAI tomorrow at 10 AM -- Clozapine  level pending  #Tachycardia -Continue to monitor, consider propranolol  #Hypertension -- Continue amlodipine  10 mg    Risk Assessment - Mild: Patient with a prior history of suicidal ideations and suicide attempt, patient not currently endorsing any suicidal thoughts, however does not have great executive function given current psychiatric decompensation.  Patient also noncompliant on medications which puts her at an increased risk of self-harm   Discharge Planning Barriers to discharge: [name] picking up on Friday for smooth transition of care Estimated length of stay: 2/6 via [name] Predicted Discharge location: home      Interval History  and update:  Reports she is agreeable to getting the long-acting injectable still.  Denies any side effects of medications except for drooling that is tolerable. Pt denies extrapyramidal symptoms including dystonia (sudden spastic contractions of muscle groups), parkinsonism (bradykinesia, tremors, rigidity), and akathisia (severe restlessness).  Reports that she is sleeping okay.  Reports her appetite is been fine.  Reports that she is excited to get back home.  ACT team coordinator Rea [phone]): Reports she is at baseline.  Reports that she can pick up patient on Friday.  Review of symptoms, specific for clozapine : Chest pain: denies Shortness of breath: denies  Exertional capacity: denies Tachycardia: Positive on vitals Cough: denies Fever: Normal temperature, denies Sedation: morningtime sedation Orthostatic hypotension: Denies Hypersalivation: Stable Constipation: Endorses bowel movement in last 24 hours Symptoms of GERD: Denies Nausea: Denies Nocturnal enuresis: Chronic     Objective Neuro - no focal deficits, ambulates with walker, oriented to person place and time, attention grossly intact  MSE Appearance: Older appearing African-American female, overweight, fair hygiene wearing same clothes for days, sleeping when entering room but is awoken easily Attitude: Cooperative Speech: Increased volume Thought process: Initially coherent and linear but over the course of minutes becomes relatively disorganized Thought content:  chronic delusions Perception: Denies AVH.  Not responding to internal stimuli SI/HI: Denies Insight/judgment: Poor/poor  Lab Results:  -- Clozapine  labs ordered include: Initial CBC WNL , troponins Neg, proBNP negative - CK 107 and CK-MB 1.8  - CRP patient refused labs  - TSH WNL - RPR Neg - ANC 2/3 3900 - clozapine  level PENDING    Vitals: Blood pressure (!) 138/94, pulse 94, temperature (!) 97.4 F (36.3 C), temperature  source Oral,  resp. rate 16, height 5' 7 (1.702 m), weight 103 kg, SpO2 100%.   Signed: Justino Cornish, MD PGY-2 Psychiatry Resident 10/01/2024, 4:50 PM

## 2024-10-01 NOTE — Group Note (Signed)
 Date:  10/01/2024 Time:  3:56 PM  Group Topic/Focus:  Self Care:   The focus of this group is to help patients understand the importance of self-care and sleep hygiene in order to improve or restore emotional, physical, spiritual, interpersonal, and financial health.    Additional Comments:  patient did not attend  Powell JAYSON Sharps 10/01/2024, 3:56 PM

## 2024-10-01 NOTE — Group Note (Signed)
 Recreation Therapy Group Note   Group Topic:Communication  Group Date: 10/01/2024 Start Time: 1030 End Time: 1050 Facilitators: Turon Kilmer-McCall, LRT,CTRS Location: [address] Dayroom   Group Topic: Communication, Team Building, Problem Solving  Goal Area(s) Addresses:  Patient will effectively work with peer towards shared goal.  Patient will identify skills used to make activity successful.  Patient will identify how skills used during activity can be applied to reach post d/c goals.   Behavioral Response:   Intervention: STEM Activity- Glass Blower/designer  Activity: Tallest [employer]. In teams of 5-6, patients were given 11 craft pipe cleaners. Using the materials provided, patients were instructed to compete again the opposing team(s) to build the tallest free-standing structure from floor level. The activity was timed; difficulty increased by clinical research associate as production designer, theatre/television/film continued.  Systematically resources were removed with additional directions for example, placing one arm behind their back, working in silence, and shape stipulations. LRT facilitated post-activity discussion reviewing team processes and necessary communication skills involved in completion. Patients were encouraged to reflect how the skills utilized, or not utilized, in this activity can be incorporated to positively impact support systems post discharge.  Education: Pharmacist, Community, Scientist, Physiological, Discharge Planning   Education Outcome: Acknowledges education/In group clarification offered/Needs additional education.    Affect/Mood: N/A   Participation Level: Did not attend    Clinical Observations/Individualized Feedback:      Plan: Continue to engage patient in RT group sessions 2-3x/week.   Tabatha Razzano-McCall, LRT,CTRS 10/01/2024 1:38 PM

## 2024-10-01 NOTE — Plan of Care (Signed)
   Problem: Education: Goal: Emotional status will improve Outcome: Progressing Goal: Mental status will improve Outcome: Progressing   Problem: Activity: Goal: Interest or engagement in activities will improve Outcome: Progressing Goal: Sleeping patterns will improve Outcome: Progressing

## 2024-10-01 NOTE — Plan of Care (Signed)

## 2024-10-01 NOTE — [facility] Group Notes (Signed)
 Adult Psychoeducational Group Note  Date:  10/01/2024 Time:  9:48 AM  Group Topic/Focus:  Goals Group:   The focus of this group is to help patients establish daily goals to achieve during treatment and discuss how the patient can incorporate goal setting into their daily lives to aide in recovery. Orientation:   The focus of this group is to educate the patient on the purpose and policies of crisis stabilization and provide a format to answer questions about their admission.  The group details unit policies and expectations of patients while admitted.  Participation Level:  Did Not Attend  Participation Quality:    Affect:    Cognitive:    Insight:   Engagement in Group:    Modes of Intervention:    Additional Comments:    Aqsa Sensabaugh O 10/01/2024, 9:48 AM

## 2024-10-01 NOTE — [facility] Group Notes (Signed)
 Adult Psychoeducational Group Note  Date:  10/01/2024 Time:  1:02 PM  Group Topic/Focus: Recreation Therapy  Participation Level:  Active  Participation Quality:    Affect:    Cognitive:    Insight:   Engagement in Group:    Modes of Intervention:    Additional Comments:    Christelle Igoe O 10/01/2024, 1:02 PM

## 2024-10-01 NOTE — [facility] Group Notes (Signed)
 Adult Psychoeducational Group Note  Date:  10/01/2024 Time:  4:53 PM  Group Topic/Focus: Emotional Wellness  Participation Level:  Active  Participation Quality:  Appropriate  Affect:  Appropriate  Cognitive:  Appropriate  Insight: Appropriate  Engagement in Group:  Engaged  Modes of Intervention:  Discussion  Additional Comments:  Pt attended the goals group and remained appropriate and engaged throughout the duration of the group.   Ranyah Groeneveld O 10/01/2024, 4:53 PM

## 2024-10-01 NOTE — Group Note (Signed)
 Date:  10/01/2024 Time:  8:34 PM  Group Topic/Focus:  Wrap-Up Group:   The focus of this group is to help patients review their daily goal of treatment and discuss progress on daily workbooks.    Participation Level:  Active  Participation Quality:  Appropriate  Affect:  Appropriate  Cognitive:  Appropriate  Insight: Appropriate  Engagement in Group:  Engaged  Modes of Intervention:  Discussion  Additional Comments:   Patient is worried about what's going to happen when they are discharged. They explained that helping people helped give them value.  [name] 10/01/2024, 8:34 PM

## 2024-10-01 NOTE — [facility] Group Notes (Signed)
 Adult Psychoeducational Group Note  Date:  10/01/2024 Time:  1:06 PM  Group Topic/Focus: Physical Wellness  Participation Level:  Did Not Attend  Participation Quality:    Affect:    Cognitive:    Insight:   Engagement in Group:    Modes of Intervention:    Additional Comments:    [name] O 10/01/2024, 1:06 PM

## 2024-10-01 NOTE — Progress Notes (Signed)
" °   10/01/24 1000  Psych Admission Type (Psych Patients Only)  Admission Status Involuntary  Psychosocial Assessment  Patient Complaints None  Eye Contact Fair  Facial Expression Anxious  Affect Preoccupied  Speech Logical/coherent  Interaction Assertive  Motor Activity Slow  Appearance/Hygiene Unremarkable  Behavior Characteristics Cooperative  Mood Preoccupied  Thought Process  Coherency Circumstantial;Disorganized  Content Blaming others  Delusions None reported or observed  Perception Hallucinations  Hallucination None reported or observed  Judgment Impaired  Confusion None  Danger to Self  Current suicidal ideation? Denies  Danger to Others  Danger to Others None reported or observed    "

## 2024-10-02 LAB — CLOZAPINE (CLOZARIL)
Clozapine Lvl: 975 ng/mL — ABNORMAL HIGH (ref 350–600)
NorClozapine: 313 ng/mL
Total(Cloz+Norcloz): 1288 ng/mL

## 2024-10-02 MED ORDER — ATROPINE SULFATE 1 % OP SOLN
1.0000 [drp] | Freq: Every day | OPHTHALMIC | Status: DC
  Administered 2024-10-02: 1 [drp] via SUBLINGUAL
  Filled 2024-10-02: qty 2

## 2024-10-02 NOTE — Progress Notes (Signed)
 [facility] Inpatient Psychiatry Progress Note  Date: 10/02/24 Patient: [name] MRN: [id number]  Assessment and Plan: [name] is a 54 y.o. female with a past psychiatric schizoaffective bipolar type, transported to the [facility] emergency department for disorganized thoughts, paranoia and refusal to take medications. She was petitioned for IVC by her ACT Team. Titrate on clozapine  to 300 and tolerating without major side effects and will get a level.  Titrating Prolixin  as second antipsychotic due to refractory psychosis based on clinical response.  At team reports patient is at baseline and will pick her up tomorrow.  Clozapine  level came back at 975 which is supratherapeutic per general guidelines but benefits certainly outweigh risk as patient not having any substantial side effects and has severe remitting treatment resistant schizophrenia.   # Schizoaffective disorder, bipolar type (HCC) -- Continue Clozaril  300 mg at bedtime for schizoaffective disorder -- Discontinue Prolixin  oral as oral overlap not required -- Continue Prolixin  25 mg LAI once every 28 days  Received 2/5  Next dose 3/5 -- Clozapine  level 975 -- Start atropine  1% ophthalmic solution 1 drop sublingual at bedtime  #Tachycardia -Continue to monitor, consider propranolol  #Hypertension -- Continue amlodipine  10 mg    Risk Assessment - Mild: Patient with a prior history of suicidal ideations and suicide attempt, patient not currently endorsing any suicidal thoughts, however does not have great executive function given current psychiatric decompensation.  Patient also noncompliant on medications which puts her at an increased risk of self-harm   Discharge Planning Barriers to discharge: [name] picking up on Friday for smooth transition of care Estimated length of stay: 2/6 via [name] Predicted Discharge location: home      Interval History and update:  Reports she is having no side  effects and getting her long-acting injectable.  Denies any new review of systems for cause of pain aside from sialorrhea for which she wants to treatment for.  Discussed atropine  drops and she agreed to start this medication.  Reports that she is excited to go home tomorrow.  Reports she had a bowel movement last night and it was normal in texture.  Reports that her sleep has been okay.  But appetite is fine.    Review of symptoms, specific for clozapine : Chest pain: denies Shortness of breath: denies  Exertional capacity: denies Tachycardia: Positive on vitals 105 Cough: denies Fever: Normal temperature, denies Sedation: morningtime sedation Orthostatic hypotension: Denies Hypersalivation: Requesting treatment due to persistence Constipation: Endorses bowel movement in last 24 hours Symptoms of GERD: Denies Nausea: Denies Nocturnal enuresis: Chronic     Objective Neuro - no focal deficits, ambulates with walker, oriented to person place and time, attention grossly intact  MSE Appearance: Older appearing African-American female, overweight, fair hygiene wearing same clothes for days, called out to provider while walking through hallway to come see her, is using her walker to ambulate Attitude: Cooperative Speech: Baseline speech Thought process: Initially coherent and linear but over the course of minutes becomes relatively disorganized Thought content:  chronic delusions Perception: Denies AVH.  Not responding to internal stimuli SI/HI: Denies Insight/judgment: Poor/poor  Lab Results:  -- Clozapine  labs ordered include: Initial CBC WNL , troponins Neg, proBNP negative - CK 107 and CK-MB 1.8  - CRP patient refused labs  - TSH WNL - RPR Neg - ANC 2/3 3900 - clozapine  level 975    Vitals: Blood pressure (!) 133/91, pulse (!) 105, temperature (!) 97.4 F (36.3 C), temperature source Oral, resp. rate  16, height 5' 7 (1.702 m), weight 103 kg, SpO2 97%.   Signed: Justino Cornish, MD PGY-2 Psychiatry Resident 10/02/2024, 2:57 PM

## 2024-10-02 NOTE — [facility] Group Notes (Signed)
 Adult Psychoeducational Group Note  Date:  10/02/2024 Time:  12:21 PM  Group Topic/Focus: Nutrition Group   Participation Level:  Did Not Attend    [name] 10/02/2024, 12:21 PM

## 2024-10-02 NOTE — Plan of Care (Signed)
   Problem: Education: Goal: Emotional status will improve Outcome: Progressing Goal: Mental status will improve Outcome: Progressing   Problem: Activity: Goal: Interest or engagement in activities will improve Outcome: Progressing Goal: Sleeping patterns will improve Outcome: Progressing

## 2024-10-02 NOTE — Group Note (Signed)
 Recreation Therapy Group Note   Group Topic:Leisure Education  Group Date: 10/02/2024 Start Time: 1002 End Time: 1038 Facilitators: Daejon Lich-McCall, LRT,CTRS Location: [address] Dayroom   Group Topic: Leisure Education    Goal Area(s) Addresses:  Patient will successfully identify benefits of leisure participation. Patient will successfully identify ways to access leisure activities. Patient will identify some leisure activities.   Behavioral Response:    Intervention: Poster Making   Activity: Patients and LRT went over group rules and expectations. Patients were then split into groups based on what county they lived in. The groups were given a poster board and writing utensils. Patients and clinical research associate had a group conversation about leisure, and leisure interests. Next patients were asked to work in their groups to come up with ideas of leisure opportunities in their counties. Patients were debriefed on being active in leisure, and also how leisure can still provide you with lessons to help you learn.   Education:  Leisure Education, Publishing Copy Outcome: Acknowledges education   Affect/Mood: N/A   Participation Level: Did not attend    Clinical Observations/Individualized Feedback:     Plan: Continue to engage patient in RT group sessions 2-3x/week.   Josanna Hefel-McCall, LRT,CTRS 10/02/2024 12:22 PM

## 2024-10-02 NOTE — Progress Notes (Signed)
(  Sleep Hours) - (Any PRNs that were needed, meds refused, or side effects to meds)- none (Any disturbances and when (visitation, over night)- none (Concerns raised by the patient)- none (SI/HI/AVH)- denies

## 2024-10-02 NOTE — Care Management Important Message (Signed)
 Patient informed of right to appeal discharge, provided phone number to [employer]. Patient expressed no interest in appealing discharge at this time. CSW will continue to monitor situation.   Gian Ybarra, LCSW 10/02/2024

## 2024-10-02 NOTE — BHH Group Notes (Signed)
 Adult Psychoeducational Group Note  Date:  10/02/2024 Time:  10:48 AM  Group Topic/Focus:  Goals Group:   The focus of this group is to help patients establish daily goals to achieve during treatment and discuss how the patient can incorporate goal setting into their daily lives to aide in recovery. Orientation:   The focus of this group is to educate the patient on the purpose and policies of crisis stabilization and provide a format to answer questions about their admission.  The group details unit policies and expectations of patients while admitted.  Participation Level:  Did Not Attend  Participation Quality:    Affect:    Cognitive:    Insight:   Engagement in Group:    Modes of Intervention:    Additional Comments:    Jennett Tarbell O 10/02/2024, 10:48 AM

## 2024-10-02 NOTE — Care Management Important Message (Signed)
 Medicare IM printed and given to social work to give to the patient. ?

## 2024-10-02 NOTE — BHH Group Notes (Addendum)
 Adult Psychoeducational Group Note  Date:  10/02/2024 Time:  10:49 AM  Group Topic/Focus: Recreation Therapy  Participation Level:  Did Not Attend  Participation Quality:    Affect:    Cognitive:    Insight:   Engagement in Group:    Modes of Intervention:    Additional Comments:    Alvaro Aungst O 10/02/2024, 10:49 AM

## 2024-10-02 NOTE — Progress Notes (Signed)
 Collateral contact - [name] (daughter) [phone]   Daughter said she will pick up patient at 10:30 AM tomorrow, Friday, 10/03/2024 at 10:30 AM.   Climmie Buelow, LCSW 10/02/2024

## 2024-10-02 NOTE — Group Note (Signed)
 Date:  10/02/2024 Time:  9:25 PM  Group Topic/Focus:  Wrap-Up Group:   The focus of this group is to help patients review their daily goal of treatment and discuss progress on daily workbooks.    Participation Level:  Active  Participation Quality:  Appropriate  Affect:  Appropriate  Cognitive:  Alert  Insight: Good  Engagement in Group:  Engaged  Modes of Intervention:  Discussion and Support  Additional Comments:  Pt attended and participated in wrap-up group.  [name] 10/02/2024, 9:25 PM

## 2024-10-02 NOTE — Plan of Care (Signed)
   Problem: Education: Goal: Emotional status will improve Outcome: Progressing Goal: Mental status will improve Outcome: Progressing

## 2024-10-03 MED ORDER — ATROPINE SULFATE 1 % OP SOLN: 1.0000 [drp] | Freq: Every day | OPHTHALMIC | 0 refills | Status: AC

## 2024-10-03 MED ORDER — AMLODIPINE BESYLATE 10 MG PO TABS: 10.0000 mg | ORAL_TABLET | Freq: Every day | ORAL | 0 refills | Status: AC

## 2024-10-03 MED ORDER — SENNA 8.6 MG PO TABS: 1.0000 | ORAL_TABLET | Freq: Every evening | ORAL | Status: AC | PRN

## 2024-10-03 MED ORDER — POLYETHYLENE GLYCOL 3350 17 G PO PACK: 17.0000 g | PACK | Freq: Every day | ORAL | Status: AC

## 2024-10-03 MED ORDER — CLOZAPINE 100 MG PO TABS: 300.0000 mg | ORAL_TABLET | Freq: Every day | ORAL | 0 refills | Status: AC

## 2024-10-03 MED ORDER — FLUPHENAZINE DECANOATE 25 MG/ML IJ SOLN: 25.0000 mg | INTRAMUSCULAR | Status: AC

## 2024-10-03 NOTE — Progress Notes (Addendum)
" °  [facility] Adult Case Management Discharge Plan :  Will you be returning to the same living situation after discharge:  Yes,  patient has her own apartment. At discharge, do you have transportation home?: Yes,  patient's daughter, [name] (daughter) [phone] will pick her up at 10:30 AM Do you have the ability to pay for your medications: No.  Release of information consent forms completed and in the chart;  [name] signature needed at discharge.  Patient to Follow up at:  Follow-up Information     [facility] Follow up on 10/01/2024.   Why: Please continue with this provider after discharge on 10/01/24 at 9:00 am for [name] services. Crisis phone number:  [phone] Contact information: [address]  [address] [place] [address] [phone]         [facility] Follow up.   Specialty: Behavioral Health Why: Referral made Contact information: [address] [place]  [address] [phone]                Next level of care provider has access to [facility] Link:no  Safety Planning and Suicide Prevention discussed: Yes,  [name] (daughter) [phone] and with patient  Has patient been referred to the Quitline?: Patient does not use tobacco/nicotine  products.  Patient reported that she quit smoking over 20 years ago.  Patient has been referred for addiction treatment: No known substance use disorder.   Lucetta Baehr O Lachele Lievanos, LCSW 10/03/2024, 9:33 AM "

## 2024-10-03 NOTE — BHH Suicide Risk Assessment (Signed)
 Suicide Risk Assessment  Discharge Assessment    [facility] Discharge Suicide Risk Assessment   Principal Problem: Schizoaffective disorder, bipolar type [facility]) Discharge Diagnoses: Principal Problem:   Schizoaffective disorder, bipolar type (HCC)  Pt denies extrapyramidal symptoms including dystonia (sudden spastic contractions of muscle groups), parkinsonism (bradykinesia, tremors, rigidity), and akathisia (severe restlessness). Mood and anxiety are stable. Having baseline psychosis per [name]. Drooling from clozapine  that is responding to atropine  drop under tongue.   Demographic Factors:  NA  Loss Factors: NA  Historical Factors: NA  Risk Reduction Factors:   [name]  Continued Clinical Symptoms:  Mood is stable. Anxiety at a manageable level. Denying any SI including passive SI. Baseline psychosis.   Cognitive Features That Contribute To Risk:  None    Suicide Risk:  Mild:  There are no identifiable suicide plans, no associated intent, mild dysphoria and related symptoms, good self-control (both objective and subjective assessment), few other risk factors, and identifiable protective factors, including available and accessible social support.   Justino Cornish, MD 10/03/2024, 7:38 AM

## 2024-10-03 NOTE — Progress Notes (Addendum)
 Patient was given return to work note.  Patient was given a list of food bank resources.   Keziah Avis, LCSW 10/03/2024

## 2024-10-03 NOTE — Progress Notes (Addendum)
 Collateral contact - [place] Mahatha Pharmacist, Hospital) [name]   [place] provided the following information: Fax:  [phone] [email or web address] Yvette [name] crisis phone number:  [phone]    Leasha Goldberger, LCSW 10/03/2024

## 2024-10-03 NOTE — Discharge Summary (Signed)
 " Physician Discharge Summary Note  Patient:  [name] is an 54 y.o., female MRN:  [id number] DOB:  July 13, 1971 Patient phone:  [phone] (home)  Patient address:   [address] [place] [address],   Date of Admission:  09/12/2024 Date of Discharge: 10/03/24  Reason for Admission:  acute on chronic psychosis  Principal Problem: Schizoaffective disorder, bipolar type [facility]) Discharge Diagnoses: Principal Problem:   Schizoaffective disorder, bipolar type [name])    Hospital Course:   Started on clozapine  with a partial response so was adjunct with Prolixin  and transition to long-acting injectable.  Experiencing baseline psychosis per ACT team.  Alkalizing labs have been within normal limits.  Clozapine  level was 900.  Felt that the lack of side effects warranted continuing this dose given the therapeutic benefit.  Only side effect that patient has been experiencing from clozapine  is sialorrhea which has responded to atropine  drops.  Minimal constipation on MiraLAX  1 capful daily.  Normal ANC and cardiac labs.  Prolixin  LAI is 25 mg and once every 28 days but could be increased in frequency to every 21 days or even every 14 days based on clinical judgment of [name] provider.  ANC 2/3: 3900 Clozapine  level: Clozapine  975, nor clozapine  313, total 1288 Troponin 1/18: Less than 15 BNP: Less than 50 CK and CK-MB: 107, 1.8 EKG: Normal sinus rhythm, QTc 459  Pt denies extrapyramidal symptoms including dystonia (sudden spastic contractions of muscle groups), parkinsonism (bradykinesia, tremors, rigidity), and akathisia (severe restlessness).  [name] will pick up patient and have confirmed she is at baseline this week.  Physical Findings: AIMS: 0  Musculoskeletal: Strength & Muscle Tone: within normal limits Gait & Station: normal Patient leans: N/A   Psychiatric Specialty Exam:  Presentation  General Appearance:  Casual  Eye Contact: Fair  Speech: Normal  Rate  Speech Volume: Increased  Handedness: Right   Mood and Affect  Mood: Euthymic  Affect: Congruent   Thought Process  Thought Processes: Disorganized (Baseline)  Descriptions of Associations:Loose  Orientation:Full (Time, Place and Person)  Thought Content:Delusions (Baseline)  History of Schizophrenia/Schizoaffective disorder:Yes  Duration of Psychotic Symptoms:Greater than six months  Hallucinations:Hallucinations: -- (Not clearly responding)  Ideas of Reference:-- (Yes but baseline)  Suicidal Thoughts:Suicidal Thoughts: No  Homicidal Thoughts:Homicidal Thoughts: No   Sensorium  Memory: Immediate Poor; Recent Poor; Remote Fair  Judgment: Poor  Insight: Poor   Executive Functions  Concentration: Fair  Attention Span: Fair  Recall: Fair  Fund of Knowledge: Fair  Language: Fair   Psychomotor Activity  Psychomotor Activity:Psychomotor Activity: Normal (No EPS.  Uses walker at baseline)   Assets  Assets: Desire for Improvement ([name])   Sleep  Sleep:Sleep: Fair  Estimated Sleeping Duration (Last 24 Hours): 7.00-8.25 hours   Physical Exam: Physical Exam Vitals and nursing note reviewed.  Pulmonary:     Effort: Pulmonary effort is normal.  Neurological:     General: No focal deficit present.     Mental Status: She is alert.  Psychiatric:     Comments: No obvious EPS.     Review of Systems  Constitutional:  Negative for fever.  HENT:         Sialorrhea  Cardiovascular:  Negative for chest pain and palpitations.  Gastrointestinal:  Negative for constipation, diarrhea, nausea and vomiting.  Neurological:  Negative for dizziness, weakness and headaches.  Psychiatric/Behavioral:         Pt denies extrapyramidal symptoms including dystonia (sudden spastic contractions of muscle groups), parkinsonism (bradykinesia, tremors,  rigidity), and akathisia (severe restlessness).    Blood pressure (!) 117/94, pulse (!) 104,  temperature (!) 97.5 F (36.4 C), temperature source Oral, resp. rate 18, height 5' 7 (1.702 m), weight 103 kg, SpO2 100%. Body mass index is 35.55 kg/m.   Tobacco Use History[1] Tobacco Cessation:  N/A, patient does not currently use tobacco products   Blood Alcohol level:  Lab Results  Component Value Date   [facility] <15 09/11/2024   ETH <10 06/22/2023    Metabolic Disorder Labs:  Lab Results  Component Value Date   HGBA1C 5.6 09/14/2024   MPG 114.02 09/14/2024   MPG 111.15 04/27/2022   Lab Results  Component Value Date   PROLACTIN 74.6 (H) 09/28/2019   PROLACTIN 4.2 (L) 12/19/2018   Lab Results  Component Value Date   CHOL 170 09/14/2024   TRIG 67 09/14/2024   HDL 47 09/14/2024   CHOLHDL 3.6 09/14/2024   VLDL 13 09/14/2024   LDLCALC 109 (H) 09/14/2024   LDLCALC 96 04/27/2022    See Psychiatric Specialty Exam and Suicide Risk Assessment completed by Attending Physician prior to discharge.  Discharge destination:  Home with ACT team follow-up  Is patient on multiple antipsychotic therapies at discharge:  Yes,   Do you recommend tapering to monotherapy for antipsychotics?  No   Has Patient had three or more failed trials of antipsychotic monotherapy by history:  Yes,   Antipsychotic medications that previously failed include:   3.  Most antipsychotics including supratherapeutic levels of clozapine .  Recommended Plan for Multiple Antipsychotic Therapies: Continue to antipsychotics based on patient's chronic history of unmanaged psychosis with clozapine  alone and has had a partial response to Prolixin  with minimal side effects.    Allergies as of 10/03/2024       Reactions   Milk-related Compounds Nausea And Vomiting   Pollen Extract Other (See Comments)   Seasonal allergies   Porcine (pork) Protein-containing Drug Products Nausea Only   Haldol [haloperidol Decanoate] Swelling, Anxiety, Other (See Comments)   Stiffness, eyes bulging- too   Penicillins Nausea And  Vomiting, Swelling, Rash   Shrimp [shellfish Allergy] Nausea Only, Rash, Other (See Comments)   Raw shrimp        Medication List     STOP taking these medications    albuterol  108 (90 Base) MCG/ACT inhaler Commonly known as: VENTOLIN  HFA   ascorbic acid  500 MG tablet Commonly known as: VITAMIN C   budesonide -formoterol  160-4.5 MCG/ACT inhaler Commonly known as: Symbicort    Caplyta 42 MG capsule Generic drug: lumateperone tosylate   divalproex  500 MG 24 hr tablet Commonly known as: DEPAKOTE  ER   fluticasone  50 MCG/ACT nasal spray Commonly known as: FLONASE    ibuprofen  800 MG tablet Commonly known as: ADVIL    Invega  Sustenna 234 MG/1.5ML injection Generic drug: paliperidone    metFORMIN  500 MG tablet Commonly known as: GLUCOPHAGE    ondansetron  4 MG disintegrating tablet Commonly known as: ZOFRAN -ODT   traZODone  50 MG tablet Commonly known as: DESYREL        TAKE these medications      Indication  amLODipine  10 MG tablet Commonly known as: NORVASC  Take 1 tablet (10 mg total) by mouth daily.  Indication: High Blood Pressure   atropine  1 % ophthalmic solution Place 1 drop under the tongue at bedtime.  Indication: siallorhea from clozapine    cloZAPine  100 MG tablet Commonly known as: CLOZARIL  Take 3 tablets (300 mg total) by mouth at bedtime.  Indication: Schizophrenia   fluPHENAZine  decanoate 25 MG/ML injection  Commonly known as: PROLIXIN  Inject 1 mL (25 mg total) into the muscle every 28 (twenty-eight) days. Start taking on: October 30, 2024  Indication: Schizophrenia   polyethylene glycol 17 g packet Commonly known as: MIRALAX  / GLYCOLAX  Take 17 g by mouth daily.  Indication: Constipation   senna 8.6 MG Tabs tablet Commonly known as: SENOKOT Take 1 tablet (8.6 mg total) by mouth at bedtime as needed for moderate constipation.  Indication: Constipation        Follow-up Information     [facility] Follow up on 10/01/2024.   Why: Please continue  with this provider after discharge on 10/01/24 at 9:00 am for [name] services. Contact information: [address]  [address] [place] [address] [phone]         [facility] Follow up.   Specialty: Behavioral Health Why: Referral made Contact information: [address] [place]  [address] [phone]                Follow-up recommendations:  Activity: as tolerated  Diet: heart healthy  Other: -Follow-up with your outpatient psychiatric provider -instructions on appointment date, time, and address (location) are provided to you in discharge paperwork.  -Take your psychiatric medications as prescribed at discharge - instructions are provided to you in the discharge paperwork  -Follow-up with outpatient primary care doctor and other specialists -for management of chronic medical disease, including: HTN, metabolic syndrome from antipscyhotics  -Testing: Follow-up with outpatient provider for abnormal lab results: none including clozapine  labs  -Recommend abstinence from alcohol, tobacco, and other illicit drug use at discharge.   -If your psychiatric symptoms recur, worsen, or if you have side effects to your psychiatric medications, call your outpatient psychiatric provider, 911, 988 or go to the nearest emergency department.  -If suicidal thoughts recur, call your outpatient psychiatric provider, 911, 988 or go to the nearest emergency department.   Signed: Justino Cornish, MD 10/03/2024, 7:49 AM           [1]  Social History Tobacco Use  Smoking Status Former   Types: Cigarettes  Smokeless Tobacco Never   "

## 2024-10-03 NOTE — BHH Group Notes (Signed)
 Adult Psychoeducational Group Note  Date:  10/03/2024 Time:  10:47 AM  Group Topic/Focus:Recreation Group    Participation Level:  Did Not Attend   [name] 10/03/2024, 10:47 AM

## 2024-10-03 NOTE — Progress Notes (Signed)
 Pt discharged to lobby. Pt was stable and appreciative at that time. All papers and prescriptions were given and valuables returned. Verbal understanding expressed. Denies SI/HI and A/VH. Pt given opportunity to express concerns and ask questions.

## 2024-10-03 NOTE — Discharge Instructions (Signed)
-  Follow-up with your outpatient psychiatric provider (and therapist) -instructions on appointment date, time, and address (location) are provided to you in discharge paperwork.  -Take your psychiatric medications as prescribed at discharge - instructions are provided to you in the discharge paperwork  -Follow-up with outpatient primary care doctor and other specialists -for management of preventative medicine and any chronic medical disease.  -Recommend abstinence from alcohol, tobacco, cannabis, and other substances at discharge.   -If your psychiatric symptoms recur, worsen, or if you have severe side effects to your psychiatric medications, call your outpatient psychiatric provider, 911, 988 (national suicide hotline), go to [facility], or go to the nearest emergency department.  -If suicidal thoughts occur, call your outpatient psychiatric provider, 911, 988 (national suicide hotline), go to [facility], or go to the nearest emergency department.  Naloxone (Narcan) can help reverse an overdose when given to the victim quickly.  [facility] offers free naloxone kits and instructions/training on its use.  Add naloxone to your first aid kit and you can help save a life.   Pick up your free kit at the following locations:   [place]:  [facility], [address] [place] [address] [phone]) [facility] [address] [place] [address] [phone]) [facility] [address] [place] [address]  [place]: [facility] [address] [place] [address] ([phone]) [facility] [address] [place] [address] [phone])

## 2024-10-03 NOTE — BHH Suicide Risk Assessment (Signed)
 BHH INPATIENT:  Family/Significant Other Suicide Prevention Education  Suicide Prevention Education:  Suicide Prevention Education was reviewed thoroughly with patient, including risk factors, warning signs, and what to do. Mobile Crisis services were described and that telephone number pointed out, with encouragement to patient to put this number in personal cell phone. Brochure was provided to patient to share with natural supports. Patient acknowledged the ways in which they are at risk, and how working through each of their issues can gradually start to reduce their risk factors. Patient was encouraged to think of the information in the context of people in their own lives. Patient denied having access to firearms Patient verbalized understanding of information provided. Patient endorsed a desire to live.    Sydelle Sherfield, LCSW 10/03/2024

## 2024-10-03 NOTE — BHH Group Notes (Signed)
 Adult Psychoeducational Group Note  Date:  10/03/2024 Time:  9:50 AM  Group Topic/Focus:  Orientation:   The focus of this group is to educate the patient on the purpose and policies of crisis stabilization and provide a format to answer questions about their admission.  The group details unit policies and expectations of patients while admitted.  Participation Level:  Active  Participation Quality:  Appropriate  Affect:  Appropriate  Cognitive:  Appropriate  Insight: Appropriate  Engagement in Group:  Engaged  Modes of Intervention:  Discussion  Additional Comments:  The patient engaged in group discussion.  Shelonda Saxe Lee 10/03/2024, 9:50 AM
